# Patient Record
Sex: Female | Born: 1950
Health system: Southern US, Community
[De-identification: ages and names within clinical notes are randomized; demographics above are authoritative.]

## PROBLEM LIST (undated history)

## (undated) DIAGNOSIS — N281 Cyst of kidney, acquired: Secondary | ICD-10-CM

## (undated) DIAGNOSIS — D259 Leiomyoma of uterus, unspecified: Secondary | ICD-10-CM

## (undated) DIAGNOSIS — Z860100 Personal history of colon polyps, unspecified: Secondary | ICD-10-CM

## (undated) DIAGNOSIS — M1990 Unspecified osteoarthritis, unspecified site: Secondary | ICD-10-CM

## (undated) DIAGNOSIS — J9600 Acute respiratory failure, unspecified whether with hypoxia or hypercapnia: Secondary | ICD-10-CM

## (undated) DIAGNOSIS — Z9289 Personal history of other medical treatment: Secondary | ICD-10-CM

## (undated) DIAGNOSIS — Z8719 Personal history of other diseases of the digestive system: Secondary | ICD-10-CM

## (undated) DIAGNOSIS — F319 Bipolar disorder, unspecified: Secondary | ICD-10-CM

## (undated) DIAGNOSIS — Z87891 Personal history of nicotine dependence: Secondary | ICD-10-CM

## (undated) DIAGNOSIS — M545 Low back pain: Secondary | ICD-10-CM

## (undated) DIAGNOSIS — E119 Type 2 diabetes mellitus without complications: Secondary | ICD-10-CM

## (undated) DIAGNOSIS — G8929 Other chronic pain: Secondary | ICD-10-CM

## (undated) DIAGNOSIS — R413 Other amnesia: Secondary | ICD-10-CM

## (undated) DIAGNOSIS — M549 Dorsalgia, unspecified: Secondary | ICD-10-CM

## (undated) DIAGNOSIS — K219 Gastro-esophageal reflux disease without esophagitis: Secondary | ICD-10-CM

## (undated) DIAGNOSIS — Z9981 Dependence on supplemental oxygen: Secondary | ICD-10-CM

## (undated) DIAGNOSIS — I10 Essential (primary) hypertension: Secondary | ICD-10-CM

## (undated) DIAGNOSIS — R350 Frequency of micturition: Secondary | ICD-10-CM

## (undated) DIAGNOSIS — E785 Hyperlipidemia, unspecified: Secondary | ICD-10-CM

## (undated) DIAGNOSIS — J40 Bronchitis, not specified as acute or chronic: Secondary | ICD-10-CM

## (undated) DIAGNOSIS — R05 Cough: Secondary | ICD-10-CM

## (undated) DIAGNOSIS — G510 Bell's palsy: Secondary | ICD-10-CM

## (undated) DIAGNOSIS — H409 Unspecified glaucoma: Secondary | ICD-10-CM

## (undated) DIAGNOSIS — R0603 Acute respiratory distress: Secondary | ICD-10-CM

## (undated) DIAGNOSIS — F259 Schizoaffective disorder, unspecified: Secondary | ICD-10-CM

## (undated) DIAGNOSIS — IMO0001 Reserved for inherently not codable concepts without codable children: Secondary | ICD-10-CM

## (undated) HISTORY — PX: COLONOSCOPY W/ POLYPECTOMY: SHX1380

## (undated) HISTORY — DX: Hypercalcemia: E83.52

## (undated) HISTORY — DX: Dorsalgia, unspecified: M54.9

## (undated) HISTORY — DX: Personal history of colonic polyps: Z86.010

## (undated) HISTORY — PX: TONSILLECTOMY AND ADENOIDECTOMY: SUR1326

## (undated) HISTORY — DX: Personal history of colon polyps, unspecified: Z86.0100

## (undated) HISTORY — DX: Other amnesia: R41.3

## (undated) HISTORY — DX: Other chronic pain: G89.29

## (undated) HISTORY — DX: Hyperlipidemia, unspecified: E78.5

## (undated) HISTORY — DX: Bipolar disorder, unspecified: F31.9

## (undated) HISTORY — DX: Low back pain: M54.5

## (undated) HISTORY — PX: ABDOMINAL HYSTERECTOMY: SHX81

## (undated) HISTORY — PX: APPENDECTOMY: SHX54

## (undated) HISTORY — DX: Leiomyoma of uterus, unspecified: D25.9

---

## 1998-06-18 ENCOUNTER — Ambulatory Visit (HOSPITAL_COMMUNITY): Admission: RE | Admit: 1998-06-18 | Discharge: 1998-06-18 | Payer: Self-pay | Admitting: Family Medicine

## 1998-07-02 ENCOUNTER — Other Ambulatory Visit: Admission: RE | Admit: 1998-07-02 | Discharge: 1998-07-02 | Payer: Self-pay | Admitting: *Deleted

## 1998-10-13 ENCOUNTER — Encounter: Payer: Self-pay | Admitting: *Deleted

## 1998-10-13 ENCOUNTER — Emergency Department (HOSPITAL_COMMUNITY): Admission: EM | Admit: 1998-10-13 | Discharge: 1998-10-13 | Payer: Self-pay | Admitting: Emergency Medicine

## 1999-05-28 ENCOUNTER — Inpatient Hospital Stay (HOSPITAL_COMMUNITY): Admission: AD | Admit: 1999-05-28 | Discharge: 1999-05-30 | Payer: Self-pay | Admitting: Family Medicine

## 1999-06-06 ENCOUNTER — Encounter: Admission: RE | Admit: 1999-06-06 | Discharge: 1999-09-04 | Payer: Self-pay | Admitting: Family Medicine

## 1999-10-22 ENCOUNTER — Encounter: Admission: RE | Admit: 1999-10-22 | Discharge: 2000-01-20 | Payer: Self-pay | Admitting: Family Medicine

## 2000-05-07 ENCOUNTER — Encounter: Payer: Self-pay | Admitting: Family Medicine

## 2000-05-07 ENCOUNTER — Ambulatory Visit (HOSPITAL_COMMUNITY): Admission: RE | Admit: 2000-05-07 | Discharge: 2000-05-07 | Payer: Self-pay | Admitting: Family Medicine

## 2000-09-02 ENCOUNTER — Other Ambulatory Visit: Admission: RE | Admit: 2000-09-02 | Discharge: 2000-09-02 | Payer: Self-pay | Admitting: Family Medicine

## 2001-06-03 ENCOUNTER — Ambulatory Visit (HOSPITAL_COMMUNITY): Admission: RE | Admit: 2001-06-03 | Discharge: 2001-06-03 | Payer: Self-pay | Admitting: Family Medicine

## 2001-06-03 ENCOUNTER — Encounter: Payer: Self-pay | Admitting: Family Medicine

## 2002-06-05 ENCOUNTER — Encounter: Payer: Self-pay | Admitting: Family Medicine

## 2002-06-05 ENCOUNTER — Ambulatory Visit (HOSPITAL_COMMUNITY): Admission: RE | Admit: 2002-06-05 | Discharge: 2002-06-05 | Payer: Self-pay | Admitting: Family Medicine

## 2004-07-30 ENCOUNTER — Encounter (INDEPENDENT_AMBULATORY_CARE_PROVIDER_SITE_OTHER): Payer: Self-pay | Admitting: *Deleted

## 2004-07-30 ENCOUNTER — Ambulatory Visit (HOSPITAL_COMMUNITY): Admission: RE | Admit: 2004-07-30 | Discharge: 2004-07-30 | Payer: Self-pay | Admitting: Gastroenterology

## 2005-08-26 ENCOUNTER — Other Ambulatory Visit: Admission: RE | Admit: 2005-08-26 | Discharge: 2005-08-26 | Payer: Self-pay | Admitting: Family Medicine

## 2007-01-27 ENCOUNTER — Encounter: Admission: RE | Admit: 2007-01-27 | Discharge: 2007-01-27 | Payer: Self-pay | Admitting: Neurosurgery

## 2007-02-11 ENCOUNTER — Encounter: Admission: RE | Admit: 2007-02-11 | Discharge: 2007-02-11 | Payer: Self-pay | Admitting: Neurosurgery

## 2007-09-15 ENCOUNTER — Encounter: Admission: RE | Admit: 2007-09-15 | Discharge: 2007-09-15 | Payer: Self-pay | Admitting: Neurosurgery

## 2007-09-29 ENCOUNTER — Encounter: Admission: RE | Admit: 2007-09-29 | Discharge: 2007-09-29 | Payer: Self-pay | Admitting: Neurosurgery

## 2009-10-17 ENCOUNTER — Encounter: Admission: RE | Admit: 2009-10-17 | Discharge: 2009-10-17 | Payer: Self-pay | Admitting: Orthopedic Surgery

## 2009-11-05 ENCOUNTER — Encounter: Admission: RE | Admit: 2009-11-05 | Discharge: 2009-11-05 | Payer: Self-pay | Admitting: Orthopedic Surgery

## 2009-11-28 ENCOUNTER — Ambulatory Visit (HOSPITAL_COMMUNITY): Admission: RE | Admit: 2009-11-28 | Discharge: 2009-11-28 | Payer: Self-pay | Admitting: Orthopedic Surgery

## 2009-12-19 ENCOUNTER — Encounter: Admission: RE | Admit: 2009-12-19 | Discharge: 2010-01-08 | Payer: Self-pay | Admitting: Orthopedic Surgery

## 2010-06-12 ENCOUNTER — Encounter: Admission: RE | Admit: 2010-06-12 | Discharge: 2010-06-12 | Payer: Self-pay | Admitting: Internal Medicine

## 2011-01-04 ENCOUNTER — Encounter: Payer: Self-pay | Admitting: Internal Medicine

## 2011-03-16 LAB — GLUCOSE, CAPILLARY: Glucose-Capillary: 150 mg/dL — ABNORMAL HIGH (ref 70–99)

## 2011-03-17 LAB — CBC
MCV: 88.6 fL (ref 78.0–100.0)
WBC: 10.6 10*3/uL — ABNORMAL HIGH (ref 4.0–10.5)

## 2011-03-17 LAB — COMPREHENSIVE METABOLIC PANEL
ALT: 25 U/L (ref 0–35)
Alkaline Phosphatase: 63 U/L (ref 39–117)
CO2: 24 mEq/L (ref 19–32)
Chloride: 105 mEq/L (ref 96–112)
GFR calc non Af Amer: >60 mL/min (ref >60)
Total Bilirubin: 0.3 mg/dL (ref 0.3–1.2)
Total Protein: 7.3 g/dL (ref 6.0–8.3)

## 2011-03-17 LAB — URINALYSIS, ROUTINE W REFLEX MICROSCOPIC
Glucose, UA: NEGATIVE mg/dL
Ketones, ur: NEGATIVE mg/dL
Protein, ur: NEGATIVE mg/dL
pH: 5.5 (ref 5.0–8.0)

## 2011-05-01 NOTE — Op Note (Signed)
NAME:  Elizabeth Thompson, FAUX                       ACCOUNT NO.:  [id number]   MEDICAL RECORD NO.:  [id number]                   PATIENT TYPE:  AMB   LOCATION:  ENDO                                 FACILITY:  [facility]   PHYSICIAN:  Nelwyn Salisbury, M.D.               DATE OF BIRTH:  18-Jan-1951   DATE OF PROCEDURE:  07/30/2004  DATE OF DISCHARGE:                                 OPERATIVE REPORT   PROCEDURE:  Colonoscopy with biopsy (cold).   INDICATIONS FOR PROCEDURE:  Iron deficiency anemia in a 60 year old African  American female, rule out colonic polyps, masses, etc.   PREPROCEDURE PREPARATION:  Informed consent was obtained from the patient.  The patient was fasted for eight hours prior to the procedure and prepped  with a bottle of magnesium citrate and a gallon of GoLYTELY the night prior  to the procedure.   PREPROCEDURE PHYSICAL:  Patient with stable vital signs.  Neck supple.  Chest clear to auscultation.  S1 and S2 regular.  Abdomen soft with normal  bowel sounds.   DESCRIPTION OF PROCEDURE:  The patient was placed in the left lateral  decubitus position, sedated with Demerol and Versed for the EGD, no  additional sedation was used for the colonoscopy.  Once the patient was  adequately sedated, maintained on low flow oxygen and continuous cardiac  monitoring, the Olympus video colonoscope was advanced from the rectum to  the cecum.  The appendiceal orifice and ileocecal valve were visualized and  photographed.  There was some residual stool in the dependent areas of the  colon, multiple washings were done.  A small sessile polyp was biopsied from  the rectosigmoid colon.  Three small sessile polyps were biopsied from the  rectum.  Retroflexion in the rectum revealed small rectal polyps as  mentioned above.  No other abnormalities were noted.  The patient tolerated  the procedure well without complications.   IMPRESSION:  1. Small sessile polyps biopsied (cold) from  rectosigmoid, colon, and     rectum.  Otherwise, unrevealing colonoscopy to the cecum.  2. Residual stool in the colon, small lesions could have been missed.   RECOMMENDATIONS:  1. Await pathology results.  2. Outpatient follow up in the next two weeks for further recommendations.                                               Nelwyn Salisbury, M.D.    JNM/MEDQ  D:  07/30/2004  T:  07/30/2004  Job:  [id number]   cc:   Lucianne Lei, M.D.  [phone] [address], [address]  [place]  [place] [address]  Fax: [phone]

## 2011-05-01 NOTE — Op Note (Signed)
NAME:  Elizabeth Thompson, Elizabeth Thompson                       ACCOUNT NO.:  [id number]   MEDICAL RECORD NO.:  [id number]                   PATIENT TYPE:  AMB   LOCATION:  ENDO                                 FACILITY:  [facility]   PHYSICIAN:  Nelwyn Salisbury, M.D.               DATE OF BIRTH:  [birth year]   DATE OF PROCEDURE:  07/30/2004  DATE OF DISCHARGE:                                 OPERATIVE REPORT   PROCEDURE:  Esophagogastroduodenoscopy with biopsy.   ENDOSCOPIST:  Juanita Craver, M.D.   INSTRUMENT USED:  Olympus video panendoscope.   INDICATIONS FOR PROCEDURE:  60 year old African American female with a  history of iron deficiency anemia undergoing EGD to rule out peptic ulcer  disease, esophagitis, gastritis, etc.   PREPROCEDURE PREPARATION:  Informed consent was obtained from the patient.  The patient was fasted for eight hours prior to the procedure.   PREPROCEDURE PHYSICAL:  Patient with stable vital signs.  Neck supple.  Chest clear to auscultation.  S1 and S2 regular.  Abdomen soft with normal  bowel sounds.   DESCRIPTION OF PROCEDURE:  The patient was placed in the left lateral  decubitus position, sedated with 60 mg of Demerol and 6 mg Versed in slow  incremental doses.  Once the patient was adequately sedated, maintained on  low flow oxygen and continuous cardiac monitoring, the Olympus video  panendoscope was advanced through the mouth piece over the tongue into the  esophagus under direct vision.  The entire esophagus appeared normal with no  evidence of ring, strictures, masses, esophagitis, or Barrett's mucosa.  The  scope was then advanced into the stomach.  Two small nodular lesions were  biopsied in the proximal stomach.  The rest of the gastric mucosa appeared  healthy.  Mild antral erythema was noted and the proximal small bowel  appeared normal.  There was no outlet obstruction.  Small bowel biopsies  were done to rule out sprue.   IMPRESSION:  1. Two small nodular  lesions biopsied from the proximal stomach.  2. Small bowel biopsies done to rule out sprue.  3. Normal appearing esophagus.  4. Mild antral gastritis.   RECOMMENDATIONS:  1. Await pathology results.  2. Avoid nonsteroidals.  3. Proceed with colonoscopy at this time.  4. Further recommendations will be made in follow up.                                               Nelwyn Salisbury, M.D.    JNM/MEDQ  D:  07/30/2004  T:  07/30/2004  Job:  [id number]   cc:   Lucianne Lei, M.D.  [phone] [address], [address]  [place]  [place] [address]  Fax: [phone]

## 2014-04-05 ENCOUNTER — Ambulatory Visit: Payer: Medicaid Other | Admitting: Podiatry

## 2014-08-14 ENCOUNTER — Emergency Department (HOSPITAL_COMMUNITY)
Admission: EM | Admit: 2014-08-14 | Discharge: 2014-08-16 | Disposition: A | Discharge status: Home / Self Care | Payer: Medicaid Other | Attending: Dermatology | Admitting: Dermatology

## 2014-08-14 ENCOUNTER — Other Ambulatory Visit: Payer: Self-pay | Admitting: Physician Assistant

## 2014-08-14 DIAGNOSIS — Z8669 Personal history of other diseases of the nervous system and sense organs: Secondary | ICD-10-CM | POA: Diagnosis not present

## 2014-08-14 DIAGNOSIS — H409 Unspecified glaucoma: Secondary | ICD-10-CM | POA: Diagnosis not present

## 2014-08-14 DIAGNOSIS — F329 Major depressive disorder, single episode, unspecified: Secondary | ICD-10-CM | POA: Insufficient documentation

## 2014-08-14 DIAGNOSIS — Z1231 Encounter for screening mammogram for malignant neoplasm of breast: Secondary | ICD-10-CM

## 2014-08-14 DIAGNOSIS — I10 Essential (primary) hypertension: Secondary | ICD-10-CM | POA: Insufficient documentation

## 2014-08-14 DIAGNOSIS — E119 Type 2 diabetes mellitus without complications: Secondary | ICD-10-CM | POA: Diagnosis not present

## 2014-08-14 DIAGNOSIS — R45851 Suicidal ideations: Secondary | ICD-10-CM | POA: Insufficient documentation

## 2014-08-14 DIAGNOSIS — R110 Nausea: Secondary | ICD-10-CM | POA: Diagnosis not present

## 2014-08-14 DIAGNOSIS — Z79899 Other long term (current) drug therapy: Secondary | ICD-10-CM | POA: Diagnosis not present

## 2014-08-14 DIAGNOSIS — F251 Schizoaffective disorder, depressive type: Secondary | ICD-10-CM

## 2014-08-14 DIAGNOSIS — F17200 Nicotine dependence, unspecified, uncomplicated: Secondary | ICD-10-CM | POA: Diagnosis not present

## 2014-08-14 DIAGNOSIS — F259 Schizoaffective disorder, unspecified: Secondary | ICD-10-CM | POA: Diagnosis not present

## 2014-08-14 DIAGNOSIS — F3289 Other specified depressive episodes: Secondary | ICD-10-CM | POA: Insufficient documentation

## 2014-08-14 DIAGNOSIS — R197 Diarrhea, unspecified: Secondary | ICD-10-CM | POA: Insufficient documentation

## 2014-08-14 HISTORY — DX: Essential (primary) hypertension: I10

## 2014-08-14 HISTORY — DX: Type 2 diabetes mellitus without complications: E11.9

## 2014-08-14 HISTORY — DX: Bell's palsy: G51.0

## 2014-08-14 HISTORY — DX: Unspecified glaucoma: H40.9

## 2014-08-14 HISTORY — DX: Schizoaffective disorder, unspecified: F25.9

## 2014-08-14 NOTE — ED Notes (Signed)
Bed: [facility] Expected date:  Expected time:  Means of arrival:  Comments:
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
## 2014-08-14 NOTE — ED Notes (Signed)
Bed: WTR7 Expected date:  Expected time:  Means of arrival:  Comments: 

## 2014-08-15 ENCOUNTER — Encounter (HOSPITAL_COMMUNITY): Payer: Self-pay | Admitting: Emergency Medicine

## 2014-08-15 DIAGNOSIS — R45851 Suicidal ideations: Secondary | ICD-10-CM

## 2014-08-15 DIAGNOSIS — R45850 Homicidal ideations: Secondary | ICD-10-CM

## 2014-08-15 DIAGNOSIS — F259 Schizoaffective disorder, unspecified: Secondary | ICD-10-CM

## 2014-08-15 LAB — COMPREHENSIVE METABOLIC PANEL
ALBUMIN: 3.8 g/dL (ref 3.5–5.2)
ALK PHOS: 72 U/L (ref 39–117)
ALT: 13 U/L (ref 0–35)
ANION GAP: 16 — AB (ref 5–15)
AST: 26 U/L (ref 0–37)
BILIRUBIN TOTAL: 0.2 mg/dL — AB (ref 0.3–1.2)
BUN: 12 mg/dL (ref 6–23)
CALCIUM: 10.7 mg/dL — AB (ref 8.4–10.5)
CO2: 22 mEq/L (ref 19–32)
CREATININE: 0.76 mg/dL (ref 0.50–1.10)
Chloride: 92 mEq/L — ABNORMAL LOW (ref 96–112)
GFR, EST NON AFRICAN AMERICAN: 88 mL/min — AB (ref >90)
Glucose, Bld: 333 mg/dL — ABNORMAL HIGH (ref 70–99)
POTASSIUM: 4.7 meq/L (ref 3.7–5.3)
SODIUM: 130 meq/L — AB (ref 137–147)
Total Protein: 8.4 g/dL — ABNORMAL HIGH (ref 6.0–8.3)

## 2014-08-15 LAB — CBG MONITORING, ED
Glucose-Capillary: 82 mg/dL (ref 70–99)
Glucose-Capillary: 97 mg/dL (ref 70–99)

## 2014-08-15 LAB — CBC
HCT: 38.7 % (ref 36.0–46.0)
Hemoglobin: 13 g/dL (ref 12.0–15.0)
MCH: 26.6 pg (ref 26.0–34.0)
MCHC: 33.6 g/dL (ref 30.0–36.0)
MCV: 79.1 fL (ref 78.0–100.0)
PLATELETS: 386 10*3/uL (ref 150–400)
RBC: 4.89 MIL/uL (ref 3.87–5.11)
RDW: 16.3 % — ABNORMAL HIGH (ref 11.5–15.5)
WBC: 11.6 10*3/uL — AB (ref 4.0–10.5)

## 2014-08-15 LAB — ETHANOL

## 2014-08-15 LAB — RAPID URINE DRUG SCREEN, HOSP PERFORMED
Amphetamines: NOT DETECTED
BENZODIAZEPINES: NOT DETECTED
Barbiturates: NOT DETECTED
Cocaine: NOT DETECTED
Opiates: NOT DETECTED
Tetrahydrocannabinol: NOT DETECTED

## 2014-08-15 LAB — ACETAMINOPHEN LEVEL: Acetaminophen (Tylenol), Serum: <15 ug/mL (ref 10–30)

## 2014-08-15 LAB — SALICYLATE LEVEL

## 2014-08-15 MED ORDER — RISPERIDONE 2 MG PO TABS
4.0000 mg | ORAL_TABLET | Freq: Every day | ORAL | Status: DC
  Administered 2014-08-15: 4 mg via ORAL
  Filled 2014-08-15: qty 2

## 2014-08-15 MED ORDER — BENZTROPINE MESYLATE 1 MG PO TABS
0.5000 mg | ORAL_TABLET | Freq: Every day | ORAL | Status: DC
  Administered 2014-08-15: 1 mg via ORAL
  Filled 2014-08-15: qty 1

## 2014-08-15 MED ORDER — CITALOPRAM HYDROBROMIDE 20 MG PO TABS
20.0000 mg | ORAL_TABLET | Freq: Every day | ORAL | Status: DC
  Administered 2014-08-15 – 2014-08-16 (×2): 20 mg via ORAL
  Filled 2014-08-15 (×2): qty 1

## 2014-08-15 MED ORDER — LATANOPROST 0.005 % OP SOLN
1.0000 [drp] | Freq: Every day | OPHTHALMIC | Status: DC
  Administered 2014-08-15: 1 [drp] via OPHTHALMIC
  Filled 2014-08-15: qty 2.5

## 2014-08-15 MED ORDER — RISPERIDONE 0.5 MG PO TABS
0.5000 mg | ORAL_TABLET | Freq: Every morning | ORAL | Status: DC
  Administered 2014-08-15 – 2014-08-16 (×2): 0.5 mg via ORAL
  Filled 2014-08-15 (×2): qty 1

## 2014-08-15 MED ORDER — SIMVASTATIN 40 MG PO TABS
40.0000 mg | ORAL_TABLET | Freq: Every day | ORAL | Status: DC
Start: 2014-08-15 — End: 2014-08-16
  Administered 2014-08-15 – 2014-08-16 (×2): 40 mg via ORAL
  Filled 2014-08-15 (×2): qty 1

## 2014-08-15 MED ORDER — TRAZODONE HCL 100 MG PO TABS
200.0000 mg | ORAL_TABLET | Freq: Every day | ORAL | Status: DC
  Administered 2014-08-15: 200 mg via ORAL
  Filled 2014-08-15: qty 2

## 2014-08-15 MED ORDER — ADULT MULTIVITAMIN W/MINERALS CH
1.0000 | ORAL_TABLET | Freq: Every day | ORAL | Status: DC
  Administered 2014-08-15 – 2014-08-16 (×2): 1 via ORAL
  Filled 2014-08-15 (×2): qty 1

## 2014-08-15 MED ORDER — DIPHENHYDRAMINE HCL 25 MG PO CAPS
50.0000 mg | ORAL_CAPSULE | Freq: Every day | ORAL | Status: DC
  Administered 2014-08-15: 50 mg via ORAL
  Filled 2014-08-15: qty 2

## 2014-08-15 MED ORDER — GLIMEPIRIDE 4 MG PO TABS
4.0000 mg | ORAL_TABLET | Freq: Every day | ORAL | Status: DC
  Administered 2014-08-15 – 2014-08-16 (×2): 4 mg via ORAL
  Filled 2014-08-15 (×3): qty 1

## 2014-08-15 MED ORDER — LISINOPRIL 40 MG PO TABS
40.0000 mg | ORAL_TABLET | Freq: Every day | ORAL | Status: DC
  Administered 2014-08-15: 40 mg via ORAL
  Filled 2014-08-15 (×2): qty 1

## 2014-08-15 MED ORDER — SODIUM CHLORIDE 0.9 % IV BOLUS (SEPSIS)
500.0000 mL | Freq: Once | INTRAVENOUS | Status: AC
  Administered 2014-08-15: 500 mL via INTRAVENOUS

## 2014-08-15 MED ORDER — METFORMIN HCL 500 MG PO TABS
1000.0000 mg | ORAL_TABLET | Freq: Two times a day (BID) | ORAL | Status: DC
  Filled 2014-08-15 (×2): qty 2

## 2014-08-15 MED ORDER — ALBUTEROL SULFATE HFA 108 (90 BASE) MCG/ACT IN AERS: 2.0000 | INHALATION_SPRAY | Freq: Four times a day (QID) | RESPIRATORY_TRACT | Status: DC | PRN

## 2014-08-15 MED ORDER — METFORMIN HCL 500 MG PO TABS
1000.0000 mg | ORAL_TABLET | Freq: Two times a day (BID) | ORAL | Status: DC
  Administered 2014-08-15 – 2014-08-16 (×3): 1000 mg via ORAL
  Filled 2014-08-15 (×5): qty 2

## 2014-08-15 NOTE — ED Notes (Signed)
Per IVC paperwork, the pt has a hx of schizoaffective disorder. Per pt she is hearing voices that command her to hurt herself, her niece, and the cats where she lives. Pt is calm and cooperative.
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
## 2014-08-15 NOTE — ED Notes (Signed)
Patient has been resting. Very pleasant and cooperative when awake. Denies SI, HI, and AV hallucinations. Says she feels like she is back to her old self after eating something and getting a bag of iv fluids. Bright affect. Sitting in bed and reading her Bible. Will continue to monitor. Libby Maw, RN

## 2014-08-16 ENCOUNTER — Inpatient Hospital Stay (HOSPITAL_COMMUNITY)
Admission: EM | Admit: 2014-08-16 | Discharge: 2014-08-27 | DRG: 885 | Disposition: A | Discharge status: Home / Self Care | Payer: Medicaid Other | Source: Intra-hospital | Attending: Psychiatry | Admitting: Psychiatry

## 2014-08-16 ENCOUNTER — Encounter (HOSPITAL_COMMUNITY): Payer: Self-pay

## 2014-08-16 DIAGNOSIS — J45909 Unspecified asthma, uncomplicated: Secondary | ICD-10-CM | POA: Diagnosis present

## 2014-08-16 DIAGNOSIS — F17200 Nicotine dependence, unspecified, uncomplicated: Secondary | ICD-10-CM | POA: Diagnosis present

## 2014-08-16 DIAGNOSIS — F259 Schizoaffective disorder, unspecified: Principal | ICD-10-CM | POA: Diagnosis present

## 2014-08-16 DIAGNOSIS — F329 Major depressive disorder, single episode, unspecified: Secondary | ICD-10-CM | POA: Diagnosis present

## 2014-08-16 DIAGNOSIS — F39 Unspecified mood [affective] disorder: Secondary | ICD-10-CM | POA: Diagnosis present

## 2014-08-16 DIAGNOSIS — R45850 Homicidal ideations: Secondary | ICD-10-CM

## 2014-08-16 DIAGNOSIS — E785 Hyperlipidemia, unspecified: Secondary | ICD-10-CM | POA: Diagnosis present

## 2014-08-16 DIAGNOSIS — I10 Essential (primary) hypertension: Secondary | ICD-10-CM | POA: Diagnosis present

## 2014-08-16 DIAGNOSIS — G3184 Mild cognitive impairment, so stated: Secondary | ICD-10-CM

## 2014-08-16 DIAGNOSIS — F251 Schizoaffective disorder, depressive type: Secondary | ICD-10-CM

## 2014-08-16 DIAGNOSIS — E7800 Pure hypercholesterolemia, unspecified: Secondary | ICD-10-CM | POA: Diagnosis present

## 2014-08-16 DIAGNOSIS — F411 Generalized anxiety disorder: Secondary | ICD-10-CM | POA: Diagnosis present

## 2014-08-16 DIAGNOSIS — F29 Unspecified psychosis not due to a substance or known physiological condition: Secondary | ICD-10-CM

## 2014-08-16 DIAGNOSIS — H409 Unspecified glaucoma: Secondary | ICD-10-CM | POA: Diagnosis present

## 2014-08-16 DIAGNOSIS — R45851 Suicidal ideations: Secondary | ICD-10-CM | POA: Diagnosis not present

## 2014-08-16 DIAGNOSIS — Z609 Problem related to social environment, unspecified: Secondary | ICD-10-CM | POA: Diagnosis not present

## 2014-08-16 DIAGNOSIS — E119 Type 2 diabetes mellitus without complications: Secondary | ICD-10-CM | POA: Diagnosis present

## 2014-08-16 DIAGNOSIS — F3289 Other specified depressive episodes: Secondary | ICD-10-CM

## 2014-08-16 DIAGNOSIS — G4700 Insomnia, unspecified: Secondary | ICD-10-CM | POA: Diagnosis present

## 2014-08-16 DIAGNOSIS — R419 Unspecified symptoms and signs involving cognitive functions and awareness: Secondary | ICD-10-CM

## 2014-08-16 LAB — COMPREHENSIVE METABOLIC PANEL
ALT: 13 U/L (ref 0–35)
ANION GAP: 15 (ref 5–15)
AST: 20 U/L (ref 0–37)
Albumin: 3.6 g/dL (ref 3.5–5.2)
Alkaline Phosphatase: 63 U/L (ref 39–117)
BUN: 13 mg/dL (ref 6–23)
CALCIUM: 10.7 mg/dL — AB (ref 8.4–10.5)
CO2: 25 mEq/L (ref 19–32)
CREATININE: 0.74 mg/dL (ref 0.50–1.10)
Chloride: 93 mEq/L — ABNORMAL LOW (ref 96–112)
GFR calc Af Amer: >90 mL/min (ref >90)
GFR calc non Af Amer: 89 mL/min — ABNORMAL LOW (ref >90)
Glucose, Bld: 119 mg/dL — ABNORMAL HIGH (ref 70–99)
Potassium: 4.3 mEq/L (ref 3.7–5.3)
Sodium: 133 mEq/L — ABNORMAL LOW (ref 137–147)
Total Bilirubin: <0.2 mg/dL — ABNORMAL LOW (ref 0.3–1.2)
Total Protein: 8.2 g/dL (ref 6.0–8.3)

## 2014-08-16 LAB — CBG MONITORING, ED
GLUCOSE-CAPILLARY: 135 mg/dL — AB (ref 70–99)
GLUCOSE-CAPILLARY: 79 mg/dL (ref 70–99)
Glucose-Capillary: 97 mg/dL (ref 70–99)

## 2014-08-16 MED ORDER — LORAZEPAM 1 MG PO TABS
2.0000 mg | ORAL_TABLET | Freq: Four times a day (QID) | ORAL | Status: DC | PRN
  Administered 2014-08-16: 2 mg via ORAL
  Filled 2014-08-16: qty 2

## 2014-08-16 MED ORDER — CITALOPRAM HYDROBROMIDE 20 MG PO TABS
20.0000 mg | ORAL_TABLET | Freq: Every day | ORAL | Status: DC
  Administered 2014-08-17: 20 mg via ORAL
  Filled 2014-08-16 (×3): qty 1

## 2014-08-16 MED ORDER — LISINOPRIL 20 MG PO TABS
40.0000 mg | ORAL_TABLET | Freq: Every day | ORAL | Status: DC
  Administered 2014-08-17 – 2014-08-21 (×5): 40 mg via ORAL
  Filled 2014-08-16 (×3): qty 2
  Filled 2014-08-16: qty 1
  Filled 2014-08-16: qty 2
  Filled 2014-08-16: qty 1
  Filled 2014-08-16 (×3): qty 2

## 2014-08-16 MED ORDER — METFORMIN HCL 500 MG PO TABS: 1000.0000 mg | ORAL_TABLET | Freq: Two times a day (BID) | ORAL | Status: DC

## 2014-08-16 MED ORDER — HALOPERIDOL LACTATE 5 MG/ML IJ SOLN: 5.0000 mg | Freq: Four times a day (QID) | INTRAMUSCULAR | Status: DC | PRN

## 2014-08-16 MED ORDER — LORAZEPAM 2 MG/ML IJ SOLN: 2.0000 mg | Freq: Four times a day (QID) | INTRAMUSCULAR | Status: DC | PRN

## 2014-08-16 MED ORDER — BENZTROPINE MESYLATE 0.5 MG PO TABS
0.5000 mg | ORAL_TABLET | Freq: Every day | ORAL | Status: DC
  Administered 2014-08-16: 0.5 mg via ORAL
  Filled 2014-08-16 (×3): qty 1

## 2014-08-16 MED ORDER — TRAZODONE HCL 100 MG PO TABS
200.0000 mg | ORAL_TABLET | Freq: Every day | ORAL | Status: DC
  Administered 2014-08-16 – 2014-08-19 (×4): 200 mg via ORAL
  Filled 2014-08-16 (×6): qty 2

## 2014-08-16 MED ORDER — RISPERIDONE 2 MG PO TABS
4.0000 mg | ORAL_TABLET | Freq: Every day | ORAL | Status: DC
  Administered 2014-08-16: 4 mg via ORAL
  Filled 2014-08-16 (×3): qty 2

## 2014-08-16 MED ORDER — ALUM & MAG HYDROXIDE-SIMETH 200-200-20 MG/5ML PO SUSP: 30.0000 mL | ORAL | Status: DC | PRN

## 2014-08-16 MED ORDER — MAGNESIUM HYDROXIDE 400 MG/5ML PO SUSP: 30.0000 mL | Freq: Every day | ORAL | Status: DC | PRN

## 2014-08-16 MED ORDER — HALOPERIDOL 5 MG PO TABS
5.0000 mg | ORAL_TABLET | Freq: Four times a day (QID) | ORAL | Status: DC | PRN
  Administered 2014-08-16: 5 mg via ORAL
  Filled 2014-08-16: qty 1

## 2014-08-16 MED ORDER — ADULT MULTIVITAMIN W/MINERALS CH
1.0000 | ORAL_TABLET | Freq: Every day | ORAL | Status: DC
  Administered 2014-08-17 – 2014-08-27 (×11): 1 via ORAL
  Filled 2014-08-16 (×14): qty 1

## 2014-08-16 MED ORDER — GLIMEPIRIDE 4 MG PO TABS
4.0000 mg | ORAL_TABLET | Freq: Every day | ORAL | Status: DC
  Administered 2014-08-17 – 2014-08-27 (×11): 4 mg via ORAL
  Filled 2014-08-16 (×11): qty 1
  Filled 2014-08-16: qty 2
  Filled 2014-08-16 (×2): qty 1
  Filled 2014-08-16: qty 2

## 2014-08-16 MED ORDER — LATANOPROST 0.005 % OP SOLN
1.0000 [drp] | Freq: Every day | OPHTHALMIC | Status: DC
  Administered 2014-08-17 – 2014-08-26 (×10): 1 [drp] via OPHTHALMIC
  Filled 2014-08-16: qty 2.5

## 2014-08-16 MED ORDER — DIPHENHYDRAMINE HCL 50 MG PO CAPS
50.0000 mg | ORAL_CAPSULE | Freq: Every day | ORAL | Status: DC
  Administered 2014-08-16: 50 mg via ORAL
  Filled 2014-08-16: qty 2
  Filled 2014-08-16 (×2): qty 1

## 2014-08-16 MED ORDER — METFORMIN HCL 500 MG PO TABS
1000.0000 mg | ORAL_TABLET | Freq: Two times a day (BID) | ORAL | Status: DC
  Administered 2014-08-17 – 2014-08-27 (×21): 1000 mg via ORAL
  Filled 2014-08-16 (×26): qty 2

## 2014-08-16 MED ORDER — RISPERIDONE 0.5 MG PO TABS
0.5000 mg | ORAL_TABLET | Freq: Every morning | ORAL | Status: DC
  Administered 2014-08-17: 0.5 mg via ORAL
  Filled 2014-08-16 (×3): qty 1

## 2014-08-16 MED ORDER — RISPERIDONE 2 MG PO TABS: 4.0000 mg | ORAL_TABLET | Freq: Every day | ORAL | Status: DC

## 2014-08-16 MED ORDER — ALBUTEROL SULFATE HFA 108 (90 BASE) MCG/ACT IN AERS: 2.0000 | INHALATION_SPRAY | Freq: Four times a day (QID) | RESPIRATORY_TRACT | Status: DC | PRN

## 2014-08-16 MED ORDER — SIMVASTATIN 40 MG PO TABS
40.0000 mg | ORAL_TABLET | Freq: Every day | ORAL | Status: DC
  Administered 2014-08-17 – 2014-08-27 (×11): 40 mg via ORAL
  Filled 2014-08-16 (×12): qty 1
  Filled 2014-08-16: qty 2
  Filled 2014-08-16: qty 1

## 2014-08-16 MED ORDER — ACETAMINOPHEN 325 MG PO TABS: 650.0000 mg | ORAL_TABLET | Freq: Four times a day (QID) | ORAL | Status: DC | PRN

## 2014-08-16 NOTE — Progress Notes (Signed)
Pt accepted to [facility] per [facility] and nursing staff are informed.    Chesley Noon, MSW, [place] Clinical Social Worker [phone]

## 2014-08-16 NOTE — Progress Notes (Addendum)
Patient ID: Elizabeth Thompson, female   DOB: 08-10-1951, 63 y.o.   MRN: [id number]  Admission Note:  D:62 yr female who presents VC in no acute distress for the treatment of SI and Depression. Pt appears flat and depressed. Pt was calm and cooperative with admission process. Pt denies SI/HI/AVH/Pain at this time. Pt stated she did have thoughts to hurt herself but they are no longer there. Pt stated she did hear voices telling herself to harm herself, but denies at this time. Pt was hyper-religious and displaying bizarre behaviors prior to coming to ER.   A: Skin was assessed and found to be clear of any abnormal marks. POC and unit policies explained and understanding verbalized. Consents obtained. Food and fluids offered,denied.  R: Pt had no additional questions or concerns.

## 2014-08-16 NOTE — Consult Note (Signed)
  Psychiatric Specialty Exam: Physical Exam  ROS  Blood pressure 106/86, pulse 103, temperature 97.6 F (36.4 C), temperature source Oral, resp. rate 16, SpO2 99.00%.There is no height or weight on file to calculate BMI.  General Appearance: Casual  Eye Contact::  Good  Speech:  Clear and Coherent  Volume:  Normal  Mood:  Depressed  Affect:  Congruent  Thought Process:  Coherent  Orientation:  Full (Time, Place, and Person)  Thought Content:  Negative  Suicidal Thoughts:  No  Homicidal Thoughts:  No  Memory:  Immediate;   Good Recent;   Good Remote;   Good  Judgement:  Fair  Insight:  Fair  Psychomotor Activity:  Normal  Concentration:  Good  Recall:  Good  Akathisia:  Negative  Handed:  Right  AIMS (if indicated):     Assets:  Communication Skills Desire for Improvement Housing Social Support  Sleep:   okay last night   Elizabeth Thompson says she is not hearing any voices this morning but is afraid it will all come back if she is discharged.  The plan is to continue looking for inpatient bed for treatment of psychosis.

## 2014-08-16 NOTE — Tx Team (Signed)
Initial Interdisciplinary Treatment Plan   PATIENT STRESSORS: Financial difficulties Marital or family conflict   PROBLEM LIST: Problem List/Patient Goals Date to be addressed Date deferred Reason deferred Estimated date of resolution  Risk for Suicide 08/16/14     AH 08/16/14     Depression 08/16/14                                          DISCHARGE CRITERIA:  Improved stabilization in mood, thinking, and/or behavior  PRELIMINARY DISCHARGE PLAN: Attend PHP/IOP Outpatient therapy  PATIENT/FAMIILY INVOLVEMENT: This treatment plan has been presented to and reviewed with the patient, Elizabeth Thompson.  The patient and family have been given the opportunity to ask questions and make suggestions.  Providence Crosby 08/16/2014, 9:23 PM

## 2014-08-17 ENCOUNTER — Encounter (HOSPITAL_COMMUNITY): Payer: Self-pay | Admitting: Psychiatry

## 2014-08-17 DIAGNOSIS — F3130 Bipolar disorder, current episode depressed, mild or moderate severity, unspecified: Secondary | ICD-10-CM

## 2014-08-17 DIAGNOSIS — R45850 Homicidal ideations: Secondary | ICD-10-CM

## 2014-08-17 LAB — GLUCOSE, CAPILLARY
GLUCOSE-CAPILLARY: 134 mg/dL — AB (ref 70–99)
Glucose-Capillary: 171 mg/dL — ABNORMAL HIGH (ref 70–99)
Glucose-Capillary: 206 mg/dL — ABNORMAL HIGH (ref 70–99)

## 2014-08-17 LAB — TSH: TSH: 1.99 u[IU]/mL (ref 0.350–4.500)

## 2014-08-17 MED ORDER — LAMOTRIGINE 25 MG PO TABS
25.0000 mg | ORAL_TABLET | Freq: Every day | ORAL | Status: DC
Start: 2014-08-17 — End: 2014-08-18
  Administered 2014-08-17: 25 mg via ORAL
  Filled 2014-08-17 (×4): qty 1

## 2014-08-17 MED ORDER — BENZTROPINE MESYLATE 0.5 MG PO TABS
0.5000 mg | ORAL_TABLET | Freq: Every day | ORAL | Status: DC
  Administered 2014-08-18: 0.5 mg via ORAL
  Filled 2014-08-17 (×3): qty 1

## 2014-08-17 MED ORDER — ARIPIPRAZOLE 5 MG PO TABS
5.0000 mg | ORAL_TABLET | Freq: Two times a day (BID) | ORAL | Status: DC
  Administered 2014-08-17 – 2014-08-18 (×2): 5 mg via ORAL
  Filled 2014-08-17 (×6): qty 1

## 2014-08-17 NOTE — [facility] Group Notes (Signed)
[facility] LCSW Group Therapy  08/17/2014  1:05 PM  Type of Therapy:  Group therapy  Participation Level:  Active  Participation Quality:  Attentive  Affect:  Flat  Cognitive:  Oriented  Insight:  Limited  Engagement in Therapy:  Limited  Modes of Intervention:  Discussion, Socialization  Summary of Progress/Problems:  Chaplain was here to lead a group on themes of hope and courage."All the hope I have now is that I can make it through this day.  Hearing what other people have to say and how they have found hope is helpful"  Tearful as she expresses it. "Trust and constant practice are two of the words that have been used today that feels right."  Went on to briefly say that lack of trust is one of the things that has brought her in to the hospital.  Talked vaguely about having the strength to walk away from things.  When asked to clarify,  She said "It's kind of foggy like its got holes in it."  Elizabeth Thompson B 08/17/2014 1:46 PM
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
## 2014-08-17 NOTE — [facility] Suicide Risk Assessment (Signed)
   Nursing information obtained from:    Demographic factors:    Current Mental Status:    Loss Factors:    Historical Factors:    Risk Reduction Factors:    Total Time spent with patient: 20 minutes  CLINICAL FACTORS:   Severe Anxiety and/or Agitation Currently Psychotic  Psychiatric Specialty Exam: Physical Exam  Constitutional: She is oriented to person, place, and time. She appears well-developed and well-nourished.  Eyes: Conjunctivae are normal. Pupils are equal, round, and reactive to light.  Neck: Normal range of motion. Neck supple.  Cardiovascular: Normal rate and regular rhythm.   GI: Soft.  Musculoskeletal: Normal range of motion.  Neurological: She is alert and oriented to person, place, and time.  Skin: Skin is warm.  Psychiatric: Her speech is normal. Her mood appears anxious. Her affect is labile and inappropriate. She is actively hallucinating. Thought content is paranoid. Cognition and memory are normal. She expresses impulsivity. She expresses homicidal and suicidal ideation.    Review of Systems  Constitutional: Negative.   HENT: Negative.   Eyes: Negative.   Respiratory: Negative.   Cardiovascular: Negative.   Gastrointestinal: Negative.   Genitourinary: Negative.   Musculoskeletal: Negative.   Skin: Negative.   Psychiatric/Behavioral: Positive for depression, suicidal ideas and hallucinations. The patient is nervous/anxious and has insomnia.     Blood pressure 112/76, pulse 106, temperature 98.9 F (37.2 C), temperature source Oral, resp. rate 20, height _0  (1.626 m), weight 86.637 kg (191 lb).Body mass index is 32.77 kg/(m^2).  General Appearance: Disheveled  Eye Sport and exercise psychologist::  Fair  Speech:  Normal Rate  Volume:  Normal  Mood:  Anxious and Depressed  Affect:  Labile and Tearful  Thought Process:  Disorganized  Orientation:  Full (Time, Place, and Person)  Thought Content:  Delusions, Hallucinations: Auditory and Paranoid Ideation  Suicidal  Thoughts:  Yes.  without intent/plan  Homicidal Thoughts:  Yes.  without intent/plan  Memory:  Immediate;   Poor Recent;   Poor Remote;   Poor  Judgement:  Impaired  Insight:  Lacking  Psychomotor Activity:  Normal  Concentration:  Poor  Recall:  Poor  Fund of Knowledge:Fair  Language: Fair  Akathisia:  No    AIMS (if indicated):   0  Assets:  Communication Skills  Sleep:  Number of Hours: 4.75   Musculoskeletal: Strength & Muscle Tone: within normal limits Gait & Station: normal Patient leans: N/A  COGNITIVE FEATURES THAT CONTRIBUTE TO RISK:  Closed-mindedness Polarized thinking    SUICIDE RISK:   Moderate:  Frequent suicidal ideation with limited intensity, and duration, some specificity in terms of plans, no associated intent, good self-control, limited dysphoria/symptomatology, some risk factors present, and identifiable protective factors, including available and accessible social support.  PLAN OF CARE:  I certify that inpatient services furnished can reasonably be expected to improve the patient's condition.  Cecille Mcclusky 08/17/2014, 12:41 PM

## 2014-08-17 NOTE — Progress Notes (Signed)
Patient ID: Elizabeth Thompson, female   DOB: [birth year], 63 y.o.   MRN: [id number] D. Pt presents with depressed mood, affect congruent. Elizabeth Thompson reports '' I'm feeling depressed, and I wanted to hurt and kill my niece, that's why I'm here. '' She was vague and could not elaborate reasons for HI towards niece. Pt thoughts disorganized. She did complete self inventory and rates her depression at 8/10 on depression scale, 10 being worst. She denies any SI. A. Medications given as ordered. Support and encouragement provided. Discussed above information with Dr. Shea Evans. R. Patient is safe. Will continue to monitor q 15 minutes for safety.

## 2014-08-17 NOTE — Progress Notes (Signed)
Inpatient Diabetes Program Recommendations  AACE/ADA: New Consensus Statement on Inpatient Glycemic Control (2013)  Target Ranges:  Prepandial:   less than 140 mg/dL      Peak postprandial:   less than 180 mg/dL (1-2 hours)      Critically ill patients:  140 - 180 mg/dL   Patient currently on home dose of Amaryl and Metformin.  CBGs have been within normal range.  Please order CBGs TID+HS for monitoring.   Thank you  Raoul Pitch BSN, RN,CDE Inpatient Diabetes Coordinator [phone] (team pager)
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
## 2014-08-18 DIAGNOSIS — F259 Schizoaffective disorder, unspecified: Principal | ICD-10-CM

## 2014-08-18 DIAGNOSIS — R45851 Suicidal ideations: Secondary | ICD-10-CM

## 2014-08-18 LAB — GLUCOSE, CAPILLARY
GLUCOSE-CAPILLARY: 146 mg/dL — AB (ref 70–99)
GLUCOSE-CAPILLARY: 185 mg/dL — AB (ref 70–99)
GLUCOSE-CAPILLARY: 174 mg/dL — AB (ref 70–99)
Glucose-Capillary: 165 mg/dL — ABNORMAL HIGH (ref 70–99)

## 2014-08-18 MED ORDER — BENZTROPINE MESYLATE 0.5 MG PO TABS
0.5000 mg | ORAL_TABLET | Freq: Every day | ORAL | Status: DC
  Administered 2014-08-19 – 2014-08-26 (×8): 0.5 mg via ORAL
  Filled 2014-08-18: qty 1
  Filled 2014-08-18: qty 4
  Filled 2014-08-18 (×8): qty 1

## 2014-08-18 MED ORDER — ARIPIPRAZOLE 10 MG PO TABS
10.0000 mg | ORAL_TABLET | Freq: Every day | ORAL | Status: DC
  Administered 2014-08-19: 10 mg via ORAL
  Filled 2014-08-18 (×2): qty 1

## 2014-08-18 MED ORDER — LAMOTRIGINE 25 MG PO TABS
25.0000 mg | ORAL_TABLET | Freq: Every day | ORAL | Status: DC
  Administered 2014-08-19 – 2014-08-20 (×2): 25 mg via ORAL
  Filled 2014-08-18 (×3): qty 1

## 2014-08-18 MED ORDER — PNEUMOCOCCAL VAC POLYVALENT 25 MCG/0.5ML IJ INJ
0.5000 mL | INJECTION | INTRAMUSCULAR | Status: AC
  Administered 2014-08-19: 0.5 mL via INTRAMUSCULAR

## 2014-08-18 NOTE — Progress Notes (Signed)
Patient ID: Elizabeth Thompson, female   DOB: 1951/11/03, 63 y.o.   MRN: [id number] D)  Elizabeth Thompson been walking in the hall this evening, stated she was tired but stated was doing"OK", just prior to group.  Seemed to be internally focused, but attended group when invited.  When asked about her eye gtts, at first couldn't remember why she took them, then said she had glaucoma, and had had blurry vision.  Was able to self administer.  Has been pleasant but minimal interaction, frequently staring.  Has been compliant with meds and went to bed after hs meds. A)  Will continue to monitor for safety, try to develop therapeutic rapport, continue POC R) safety maintained.

## 2014-08-18 NOTE — Progress Notes (Signed)
Patient ID: Elizabeth Thompson, female   DOB: 08-02-1951, 63 y.o.   MRN: [id number] D. Pt presents with depressed mood, affect congruent again today. Aizza states '' I didn't sleep last night. I'm really tired. '' She appears very sleepy and is not forthcoming with thoughts. She has been compliant with medications . A. Medications given as ordered. Support and encouragement provided. Discussed above information with Dr. Darleene Cleaver. R. Patient is safe. Will continue to monitor q 15 minutes for safety.

## 2014-08-18 NOTE — BHH Group Notes (Signed)
[place] Group Notes:  (Nursing/MHT/Case Management/Adjunct)  Date:  08/18/2014  Time:  10:40 AM  Type of Therapy:  Psychoeducational Skills--self inventory review  Participation Level:  Did Not Attend  Participation Quality:  na  Affect:  na  Cognitive:  na  Insight:  None  Engagement in Group:  na  Modes of Intervention:  na  Summary of Progress/Problems:  Leonia Reader 08/18/2014, 10:40 AM

## 2014-08-18 NOTE — BHH Group Notes (Signed)
[place] Group Notes:  (Nursing/MHT/Case Management/Adjunct)  Date:  08/18/2014  Time:  10:40 AM  Type of Therapy:  Psychoeducational Skills--healthy coping skill  Participation Level:  Did Not Attend  Participation Quality:  na  Affect:  na  Cognitive:  na  Insight:  None  Engagement in Group:  na  Modes of Intervention:  na  Summary of Progress/Problems:  Leonia Reader 08/18/2014, 10:40 AM

## 2014-08-18 NOTE — Progress Notes (Signed)
D: Pt denies SI/HI/AVH. Pt is pleasant and cooperative. Pt continues to appear to be responding to internal stimuli even though she stated she was -ve. Pt appears disorganized at times, but does not talk to Probation officer much.   A: Pt was offered support and encouragement. Pt was given scheduled medications. Pt was encourage to attend groups. Q 15 minute checks were done for safety.   R:Pt attends groups and interacts well with peers . Pt is taking medication. Pt has no complaints at this time.Pt receptive to treatment and safety maintained on unit.

## 2014-08-18 NOTE — Progress Notes (Signed)
[facility] MD Progress Note  08/18/2014 1:54 PM JAMIRACLE AVANTS  MRN:  [id number] Subjective: "I am having trouble sleeping, racing thoughts and feeling depressed.'' Objective: Patient was seen and chart reviewed. She reports difficulty sleeping, excessive worries, mood swings, paranoia and depressive symptoms. Patient continues to report hearing voices telling her to hurt herself. She denies homicidal ideations and visual hallucinations. Patient has been attending the unit milieu and her medications without verbalizing any adverse reactions. Diagnosis:   DSM5: Schizophrenia Disorders:  Delusional Disorder (297.1) and Psychotic Disorder (298.8) Obsessive-Compulsive Disorders:   Trauma-Stressor Disorders:   Substance/Addictive Disorders:  denies Depressive Disorders:  Major Depressive Disorder - with Psychotic Features (296.24) Total Time spent with patient: 30 minutes  Axis I: Schizoaffective disorder  Axis II: Deferred Axis III:  Past Medical History  Diagnosis Date  . Hypertension   . Bell's palsy   . Glaucoma   . Diabetes mellitus without complication    Axis IV: other psychosocial or environmental problems and problems related to social environment  ADL's:  Intact  Sleep: Poor  Appetite:  Fair  Suicidal Ideation: passive  Plan:  denies Homicidal Ideation:  denies AEB (as evidenced by):  Psychiatric Specialty Exam: Physical Exam  Psychiatric: Judgment normal. Her mood appears anxious. Her speech is rapid and/or pressured. She is actively hallucinating. Thought content is paranoid. Cognition and memory are normal. She exhibits a depressed mood. She expresses suicidal ideation.    Review of Systems  Constitutional: Negative.   HENT: Negative.   Eyes: Negative.   Respiratory: Negative.   Cardiovascular: Negative.   Gastrointestinal: Negative.   Genitourinary: Negative.   Musculoskeletal: Negative.   Skin: Negative.   Neurological: Negative.   Endo/Heme/Allergies:  Negative.   Psychiatric/Behavioral: Positive for depression and hallucinations. The patient is nervous/anxious and has insomnia.     Blood pressure 127/68, pulse 98, temperature 98.4 F (36.9 C), temperature source Oral, resp. rate 16, height _0  (1.626 m), weight 86.637 kg (191 lb).Body mass index is 32.77 kg/(m^2).  General Appearance: Disheveled  Eye Contact::  Minimal  Speech:  Garbled and Slow  Volume:  Decreased  Mood:  Dysphoric  Affect:  Constricted  Thought Process:  Circumstantial and Disorganized  Orientation:  Full (Time, Place, and Person)  Thought Content:  Delusions and Hallucinations: Auditory  Suicidal Thoughts:  Yes.  without intent/plan  Homicidal Thoughts:  No  Memory:  Immediate;   Fair Recent;   Fair Remote;   Fair  Judgement:  Impaired  Insight:  Lacking  Psychomotor Activity:  Decreased  Concentration:  Fair  Recall:  [employer] of Knowledge:Good  Language: Good  Akathisia:  No  Handed:  Right  AIMS (if indicated):     Assets:  Communication Skills Desire for Improvement Physical Health  Sleep:  Number of Hours: 5.75   Musculoskeletal: Strength & Muscle Tone: within normal limits Gait & Station: normal Patient leans: N/A  Current Medications: Current Facility-Administered Medications  Medication Dose Route Frequency Provider Last Rate Last Dose  . acetaminophen (TYLENOL) tablet 650 mg  650 mg Oral Q6H PRN Shuvon Rankin, NP      . albuterol (PROVENTIL HFA;VENTOLIN HFA) 108 (90 BASE) MCG/ACT inhaler 2 puff  2 puff Inhalation Q6H PRN Shuvon Rankin, NP      . alum & mag hydroxide-simeth (MAALOX/MYLANTA) 200-200-20 MG/5ML suspension 30 mL  30 mL Oral Q4H PRN Shuvon Rankin, NP      . [START ON 08/19/2014] ARIPiprazole (ABILIFY) tablet 10 mg  10 mg Oral  QHS Tivon Lemoine      . [START ON 08/19/2014] benztropine (COGENTIN) tablet 0.5 mg  0.5 mg Oral QHS Bolden Hagerman      . glimepiride (AMARYL) tablet 4 mg  4 mg Oral Q breakfast Shuvon Rankin, NP   4  mg at 08/18/14 0807  . [START ON 08/19/2014] lamoTRIgine (LAMICTAL) tablet 25 mg  25 mg Oral Daily Leelynn Whetsel      . latanoprost (XALATAN) 0.005 % ophthalmic solution 1 drop  1 drop Both Eyes QHS Shuvon Rankin, NP   1 drop at 08/17/14 2123  . lisinopril (PRINIVIL,ZESTRIL) tablet 40 mg  40 mg Oral Daily Shuvon Rankin, NP   40 mg at 08/18/14 0805  . magnesium hydroxide (MILK OF MAGNESIA) suspension 30 mL  30 mL Oral Daily PRN Shuvon Rankin, NP      . metFORMIN (GLUCOPHAGE) tablet 1,000 mg  1,000 mg Oral BID WC Shuvon Rankin, NP   1,000 mg at 08/18/14 0805  . multivitamin with minerals tablet 1 tablet  1 tablet Oral Daily Shuvon Rankin, NP   1 tablet at 08/18/14 0805  . [START ON 08/19/2014] pneumococcal 23 valent vaccine (PNU-IMMUNE) injection 0.5 mL  0.5 mL Intramuscular Tomorrow-1000 Saramma Eappen, MD      . simvastatin (ZOCOR) tablet 40 mg  40 mg Oral Daily Shuvon Rankin, NP   40 mg at 08/18/14 0805  . traZODone (DESYREL) tablet 200 mg  200 mg Oral QHS Shuvon Rankin, NP   200 mg at 08/17/14 2247    Lab Results:  Results for orders placed during the hospital encounter of 08/16/14 (from the past 48 hour(s))  GLUCOSE, CAPILLARY     Status: Abnormal   Collection Time    08/17/14 11:55 AM      Result Value Ref Range   Glucose-Capillary 134 (*) 70 - 99 mg/dL  GLUCOSE, CAPILLARY     Status: Abnormal   Collection Time    08/17/14  4:58 PM      Result Value Ref Range   Glucose-Capillary 171 (*) 70 - 99 mg/dL  TSH     Status: None   Collection Time    08/17/14  7:41 PM      Result Value Ref Range   TSH 1.990  0.350 - 4.500 uIU/mL   Comment: Performed at [place], CAPILLARY     Status: Abnormal   Collection Time    08/17/14  8:41 PM      Result Value Ref Range   Glucose-Capillary 206 (*) 70 - 99 mg/dL  GLUCOSE, CAPILLARY     Status: Abnormal   Collection Time    08/18/14  6:06 AM      Result Value Ref Range   Glucose-Capillary 146 (*) 70 - 99 mg/dL  GLUCOSE,  CAPILLARY     Status: Abnormal   Collection Time    08/18/14 11:40 AM      Result Value Ref Range   Glucose-Capillary 165 (*) 70 - 99 mg/dL   Comment 1 Documented in Chart     Comment 2 Notify RN      Physical Findings: AIMS: Facial and Oral Movements Muscles of Facial Expression: None, normal Lips and Perioral Area: None, normal Jaw: None, normal Tongue: None, normal,Extremity Movements Upper (arms, wrists, hands, fingers): None, normal Lower (legs, knees, ankles, toes): None, normal, Trunk Movements Neck, shoulders, hips: None, normal, Overall Severity Severity of abnormal movements (highest score from questions above): None, normal Incapacitation due to abnormal movements: None,  normal Patient's awareness of abnormal movements (rate only patient's report): No Awareness, Dental Status Current problems with teeth and/or dentures?: Yes (no teeth) Does patient usually wear dentures?: Yes (don't have with her)  CIWA:  CIWA-Ar Total: 0 COWS:  COWS Total Score: 1  Treatment Plan Summary: Daily contact with patient to assess and evaluate symptoms and progress in treatment Medication management  Plan: 1. Admit for crisis management and stabilization. 2. Medication management to reduce current symptoms to base line and improve the patient's overall level of functioning: -Continue Abilify 65m po qhs mood/psychosis -Continue Trazodone 2092mpo qhs for insomnia -Lamictal 2549mo daily for mood stabilization. -Cogentin 0.5mg45m qhs for EPS prevention 3. Treat health problems as indicated. 4. Develop treatment plan to decrease risk of relapse upon discharge and the need for     readmission. 5. Psycho-social education regarding relapse prevention and self care. 6. Health care follow up as needed for medical problems. 7. Restart home medications where appropriate.   Medical Decision Making Problem Points:  Established problem, worsening (2), Review of last therapy session (1) and  Review of psycho-social stressors (1) Data Points:  Order Aims Assessment (2) Review of medication regiment & side effects (2) Review of new medications or change in dosage (2)  I certify that inpatient services furnished can reasonably be expected to improve the patient's condition.   Aldine Chakraborty,MD 08/18/2014, 1:54 PM

## 2014-08-18 NOTE — Progress Notes (Signed)
[place] Group Notes:  (Nursing/MHT/Case Management/Adjunct)  Date:  08/18/2014  Time:  9:34 PM  Type of Therapy:  Group Therapy  Participation Level:  Minimal  Participation Quality:  Appropriate  Affect:  Appropriate  Cognitive:  Appropriate  Insight:  Lacking  Engagement in Group:  Poor  Modes of Intervention:  Socialization and Support  Summary of Progress/Problems: Pt. Stated her energy level was a 9.  Pt. Spoke about her support system.   Lanell Persons 08/18/2014, 9:34 PM

## 2014-08-18 NOTE — BHH Group Notes (Signed)
[facility] Group Notes:  (Clinical Social Work)  08/18/2014  11:15-12:00PM  Summary of Progress/Problems:   The main focus of today's process group was to discuss patients' feelings about hospitalization, the stigma attached to mental health, and sources of motivation to stay well.  We then worked to identify a specific plan to avoid future hospitalizations when discharged from the hospital for this admission.  The patient expressed that she felt terrible today, that she cannot sleep and is very tired.  She said she is in the hospital due to suicidal thoughts, and that she feels she is in the hospital because she needs to be.  She listened for the rest of group attentively but did not contribute.  Type of Therapy:  Group Therapy - Process  Participation Level:  Active  Participation Quality:  Attentive and Drowsy  Affect:  Depressed and Flat  Cognitive:  Oriented  Insight:  Improving  Engagement in Therapy:  Developing/Improving  Modes of Intervention:  Exploration, Discussion  Selmer Dominion, LCSW 08/18/2014, 12:46 PM

## 2014-08-19 LAB — GLUCOSE, CAPILLARY
GLUCOSE-CAPILLARY: 100 mg/dL — AB (ref 70–99)
GLUCOSE-CAPILLARY: 77 mg/dL (ref 70–99)
Glucose-Capillary: 123 mg/dL — ABNORMAL HIGH (ref 70–99)
Glucose-Capillary: 95 mg/dL (ref 70–99)

## 2014-08-19 NOTE — Progress Notes (Signed)
Patient ID: Elizabeth Thompson, female   DOB: 11-19-1951, 63 y.o.   MRN: [id number] [facility] MD Progress Note  08/19/2014 12:08 PM GRETE BOSKO  MRN:  [id number] Subjective: "I slept better last night but still having racing thoughts and feeling depressed.''  Objective: Patient was seen and her chart was reviewed. She reports decreased anxiety, worries, mood swings, paranoia and depressive symptoms. However, she continues to endorse auditory hallucinations,  hearing voices telling her to hurt herself. She denies homicidal ideations and visual hallucinations. Patient has been attending the unit milieu and her medications without verbalizing any adverse reactions. Diagnosis:   DSM5: Schizophrenia Disorders:  Delusional Disorder (297.1) and Psychotic Disorder (298.8) Obsessive-Compulsive Disorders:   Trauma-Stressor Disorders:   Substance/Addictive Disorders:  denies Depressive Disorders:  Major Depressive Disorder - with Psychotic Features (296.24) Total Time spent with patient: 25 minutes  Axis I: Schizoaffective disorder  Axis II: Deferred Axis III:  Past Medical History  Diagnosis Date  . Hypertension   . Bell's palsy   . Glaucoma   . Diabetes mellitus without complication    Axis IV: other psychosocial or environmental problems and problems related to social environment  ADL's:  Intact  Sleep: fair  Appetite:  Fair  Suicidal Ideation: passive  Plan:  denies Homicidal Ideation:  denies AEB (as evidenced by):  Psychiatric Specialty Exam: Physical Exam  Psychiatric: Judgment normal. Her mood appears anxious. Her speech is rapid and/or pressured. She is actively hallucinating. Thought content is paranoid. Cognition and memory are normal. She exhibits a depressed mood. She expresses suicidal ideation.    Review of Systems  Constitutional: Negative.   HENT: Negative.   Eyes: Negative.   Respiratory: Negative.   Cardiovascular: Negative.   Gastrointestinal: Negative.    Genitourinary: Negative.   Musculoskeletal: Negative.   Skin: Negative.   Neurological: Negative.   Endo/Heme/Allergies: Negative.   Psychiatric/Behavioral: Positive for depression and hallucinations. The patient is nervous/anxious and has insomnia.     Blood pressure 123/62, pulse 87, temperature 98.2 F (36.8 C), temperature source Oral, resp. rate 17, height _0  (1.626 m), weight 86.637 kg (191 lb).Body mass index is 32.77 kg/(m^2).  General Appearance: Disheveled  Eye Contact::  Minimal  Speech:  Garbled and Slow  Volume:  Decreased  Mood:  Dysphoric  Affect:  Constricted  Thought Process:  Circumstantial and Disorganized  Orientation:  Full (Time, Place, and Person)  Thought Content:  Delusions and Hallucinations: Auditory  Suicidal Thoughts:  Yes.  without intent/plan  Homicidal Thoughts:  No  Memory:  Immediate;   Fair Recent;   Fair Remote;   Fair  Judgement:  Impaired  Insight:  Lacking  Psychomotor Activity:  Decreased  Concentration:  Fair  Recall:  [employer] of Knowledge:Good  Language: Good  Akathisia:  No  Handed:  Right  AIMS (if indicated):     Assets:  Communication Skills Desire for Improvement Physical Health  Sleep:  Number of Hours: 6.75   Musculoskeletal: Strength & Muscle Tone: within normal limits Gait & Station: normal Patient leans: N/A  Current Medications: Current Facility-Administered Medications  Medication Dose Route Frequency Provider Last Rate Last Dose  . acetaminophen (TYLENOL) tablet 650 mg  650 mg Oral Q6H PRN Shuvon Rankin, NP      . albuterol (PROVENTIL HFA;VENTOLIN HFA) 108 (90 BASE) MCG/ACT inhaler 2 puff  2 puff Inhalation Q6H PRN Shuvon Rankin, NP      . alum & mag hydroxide-simeth (MAALOX/MYLANTA) 200-200-20 MG/5ML suspension 30 mL  30  mL Oral Q4H PRN Shuvon Rankin, NP      . ARIPiprazole (ABILIFY) tablet 10 mg  10 mg Oral QHS Dewaine Morocho      . benztropine (COGENTIN) tablet 0.5 mg  0.5 mg Oral QHS Keigan Girten       . glimepiride (AMARYL) tablet 4 mg  4 mg Oral Q breakfast Shuvon Rankin, NP   4 mg at 08/19/14 0740  . lamoTRIgine (LAMICTAL) tablet 25 mg  25 mg Oral Daily Alexa Blish   25 mg at 08/19/14 0740  . latanoprost (XALATAN) 0.005 % ophthalmic solution 1 drop  1 drop Both Eyes QHS Shuvon Rankin, NP   1 drop at 08/18/14 2152  . lisinopril (PRINIVIL,ZESTRIL) tablet 40 mg  40 mg Oral Daily Shuvon Rankin, NP   40 mg at 08/19/14 0740  . magnesium hydroxide (MILK OF MAGNESIA) suspension 30 mL  30 mL Oral Daily PRN Shuvon Rankin, NP      . metFORMIN (GLUCOPHAGE) tablet 1,000 mg  1,000 mg Oral BID WC Shuvon Rankin, NP   1,000 mg at 08/19/14 0740  . multivitamin with minerals tablet 1 tablet  1 tablet Oral Daily Shuvon Rankin, NP   1 tablet at 08/19/14 0740  . simvastatin (ZOCOR) tablet 40 mg  40 mg Oral Daily Shuvon Rankin, NP   40 mg at 08/19/14 0740  . traZODone (DESYREL) tablet 200 mg  200 mg Oral QHS Shuvon Rankin, NP   200 mg at 08/18/14 2152    Lab Results:  Results for orders placed during the hospital encounter of 08/16/14 (from the past 48 hour(s))  GLUCOSE, CAPILLARY     Status: Abnormal   Collection Time    08/17/14  4:58 PM      Result Value Ref Range   Glucose-Capillary 171 (*) 70 - 99 mg/dL  TSH     Status: None   Collection Time    08/17/14  7:41 PM      Result Value Ref Range   TSH 1.990  0.350 - 4.500 uIU/mL   Comment: Performed at [place], CAPILLARY     Status: Abnormal   Collection Time    08/17/14  8:41 PM      Result Value Ref Range   Glucose-Capillary 206 (*) 70 - 99 mg/dL  GLUCOSE, CAPILLARY     Status: Abnormal   Collection Time    08/18/14  6:06 AM      Result Value Ref Range   Glucose-Capillary 146 (*) 70 - 99 mg/dL  GLUCOSE, CAPILLARY     Status: Abnormal   Collection Time    08/18/14 11:40 AM      Result Value Ref Range   Glucose-Capillary 165 (*) 70 - 99 mg/dL   Comment 1 Documented in Chart     Comment 2 Notify RN    GLUCOSE,  CAPILLARY     Status: Abnormal   Collection Time    08/18/14  5:03 PM      Result Value Ref Range   Glucose-Capillary 185 (*) 70 - 99 mg/dL   Comment 1 Documented in Chart     Comment 2 Notify RN    GLUCOSE, CAPILLARY     Status: Abnormal   Collection Time    08/18/14  9:50 PM      Result Value Ref Range   Glucose-Capillary 174 (*) 70 - 99 mg/dL   Comment 1 Notify RN     Comment 2 Documented in Chart    GLUCOSE,  CAPILLARY     Status: Abnormal   Collection Time    08/19/14  6:03 AM      Result Value Ref Range   Glucose-Capillary 123 (*) 70 - 99 mg/dL   Comment 1 Notify RN     Comment 2 Documented in Chart    GLUCOSE, CAPILLARY     Status: None   Collection Time    08/19/14 11:47 AM      Result Value Ref Range   Glucose-Capillary 95  70 - 99 mg/dL    Physical Findings: AIMS: Facial and Oral Movements Muscles of Facial Expression: None, normal Lips and Perioral Area: None, normal Jaw: None, normal Tongue: None, normal,Extremity Movements Upper (arms, wrists, hands, fingers): None, normal Lower (legs, knees, ankles, toes): None, normal, Trunk Movements Neck, shoulders, hips: None, normal, Overall Severity Severity of abnormal movements (highest score from questions above): None, normal Incapacitation due to abnormal movements: None, normal Patient's awareness of abnormal movements (rate only patient's report): No Awareness, Dental Status Current problems with teeth and/or dentures?: Yes (no teeth) Does patient usually wear dentures?: Yes (don't have with her)  CIWA:  CIWA-Ar Total: 0 COWS:  COWS Total Score: 1  Treatment Plan Summary: Daily contact with patient to assess and evaluate symptoms and progress in treatment Medication management  Plan: 1. Admit for crisis management and stabilization. 2. Medication management to reduce current symptoms to base line and improve the patient's overall level of functioning: -Continue Abilify 33m po qhs mood/psychosis -Continue  Trazodone 2012mpo qhs for insomnia -Lamictal 2537mo daily for mood stabilization. -Cogentin 0.5mg80m qhs for EPS prevention 3. Treat health problems as indicated. 4. Develop treatment plan to decrease risk of relapse upon discharge and the need for     readmission. 5. Psycho-social education regarding relapse prevention and self care. 6. Health care follow up as needed for medical problems. 7. Restart home medications where appropriate.   Medical Decision Making Problem Points:  Established problem, worsening (2), Review of last therapy session (1) and Review of psycho-social stressors (1) Data Points:  Order Aims Assessment (2) Review of medication regiment & side effects (2) Review of new medications or change in dosage (2)  I certify that inpatient services furnished can reasonably be expected to improve the patient's condition.   Bryannah Boston,MD 08/19/2014, 12:08 PM

## 2014-08-19 NOTE — Progress Notes (Signed)
Pt stated that she had a fair day. The first thing she plans to do when she gets home is to eat a good meal.

## 2014-08-19 NOTE — Progress Notes (Signed)
Patient ID: ZYAIR RHEIN, female   DOB: July 04, 1951, 63 y.o.   MRN: [id number] D)  Has spent much of the evening in the dayroom, seems somewhat preoccupied at times and gives very little verbal response, and is slow, thought blocking.  Will be a little more responsive to select peers, but can sit quietly next to them and not say anything, preoccupied with her thoughts.  Initially refused hs meds, but agreed to take them when approached again later, was able to self administer her eye gtts. Went to her room after meds to get ready for bed. A)  Will continue to monitor for safety, continue POC R)  Remains safe on unit.[name]

## 2014-08-19 NOTE — BHH Group Notes (Signed)
[facility] Group Notes:  (Clinical Social Work)  08/19/2014   11:15am-12:00pm  Summary of Progress/Problems:  The main focus of today's process group was to listen to a variety of genres of music and to identify that different types of music provoke different responses.  The patient then was able to identify personally what was soothing for them, as well as energizing.  The patient expressed understanding of concepts, as well as knowledge of how each type of music affected him/her and how this can be used at home as a wellness/recovery tool.  She particularly enjoys the symphony, she stated, and she smile broadly when that was played for her.  She smiled a number of times during group, and expressed appreciation at the end of group.  Type of Therapy:  Music Therapy   Participation Level:  Active  Participation Quality:  Attentive and Sharing  Affect:  Blunted  Cognitive:  Oriented  Insight:  Engaged  Engagement in Therapy:  Engaged  Modes of Intervention:   Activity, Exploration  Selmer Dominion, LCSW 08/19/2014, 12:30pm

## 2014-08-19 NOTE — BHH Group Notes (Signed)
[facility] Group Notes:  (Nursing/MHT/Case Management/Adjunct)  Date:  08/19/2014  Time: 0920 and 0940 am  Type of Therapy:  Psychoeducational Skills  Participation Level:  Minimal  Participation Quality:  Inattentive  Affect:  Not Congruent  Cognitive:  Confused  Insight:  Lacking  Engagement in Group:  Limited  Modes of Intervention:  Support  Summary of Progress/Problems:  Zipporah Plants 08/19/2014, 1:52 PM

## 2014-08-19 NOTE — Progress Notes (Signed)
Patient ID: [name], female   DOB: [birth year], 63 y.o.   MRN: [id number] D. Pt presents with depressed mood, affect congruent again today. Elizabeth Thompson continues to appear disorganized, with thought blocking. She stares at staff upon approach, and takes several moments to respond. This morning she states '' I'm doing fine. I rested better '' She appears very dazed at times. She has been compliant with medications . A. Medications given as ordered. Support and encouragement provided. Discussed above information with Dr. Darleene Cleaver. R. Patient is safe. Will continue to monitor q 15 minutes for safety.

## 2014-08-19 NOTE — Clinical Social Work Note (Signed)
Clinical Social Work Note  At [employer] request, CSW looked into her belongings list to ascertain if her clothes had been placed in a locker.  The only thing in the locker is bobby pins.  CSW retrieved clothing for her from the clothing closet.  Selmer Dominion, LCSW 08/19/2014, 12:39 PM

## 2014-08-20 LAB — GLUCOSE, CAPILLARY
GLUCOSE-CAPILLARY: 67 mg/dL — AB (ref 70–99)
Glucose-Capillary: 96 mg/dL (ref 70–99)
Glucose-Capillary: 61 mg/dL — ABNORMAL LOW (ref 70–99)
Glucose-Capillary: 125 mg/dL — ABNORMAL HIGH (ref 70–99)
Glucose-Capillary: 105 mg/dL — ABNORMAL HIGH (ref 70–99)

## 2014-08-20 MED ORDER — ONDANSETRON 4 MG PO TBDP
4.0000 mg | ORAL_TABLET | Freq: Three times a day (TID) | ORAL | Status: DC | PRN
  Administered 2014-08-20: 4 mg via ORAL

## 2014-08-20 MED ORDER — ONDANSETRON 4 MG PO TBDP
ORAL_TABLET | ORAL | Status: AC
  Filled 2014-08-20: qty 1

## 2014-08-20 MED ORDER — ARIPIPRAZOLE 15 MG PO TABS: 15.0000 mg | ORAL_TABLET | Freq: Every day | ORAL | Status: DC

## 2014-08-20 MED ORDER — ARIPIPRAZOLE 10 MG PO TABS
10.0000 mg | ORAL_TABLET | Freq: Every day | ORAL | Status: DC
  Administered 2014-08-20: 10 mg via ORAL
  Filled 2014-08-20 (×2): qty 1

## 2014-08-20 MED ORDER — TRAZODONE HCL 100 MG PO TABS
100.0000 mg | ORAL_TABLET | Freq: Every day | ORAL | Status: DC
  Administered 2014-08-20 – 2014-08-21 (×2): 100 mg via ORAL
  Filled 2014-08-20 (×3): qty 1

## 2014-08-20 MED ORDER — LAMOTRIGINE 25 MG PO TABS
25.0000 mg | ORAL_TABLET | Freq: Every evening | ORAL | Status: DC
  Administered 2014-08-21: 25 mg via ORAL
  Filled 2014-08-20 (×2): qty 1

## 2014-08-20 NOTE — Progress Notes (Signed)
Patient ID: Elizabeth Thompson, female   DOB: [birth year], 63 y.o.   MRN: [id number] D: Patient reluctant to take meds this AM stating "they aren't good for me".  Eventually took medication but was extremely irritable and unhappy about having to take them.  Interacts very little with staff or peers. Had episode this AM when she became drowsy and came to med window and laid head in window.  Was put in a wheelchair and taken to her room.  BP was 92/48 pulse 82 CBG 99.  Patient fell asleep at that time. A: Patient given emotional support from RN. Patient given medications per MD orders. Patient encouraged to attend groups and unit activities. Patient encouraged to come to staff with any questions or concerns.  R: Patient remains irritable.Minimal participation in groups.Will continue to monitor patient for safety.

## 2014-08-20 NOTE — Progress Notes (Signed)
Pt c/o feeling nauseated. Dr. Parke Poisson was phoned to get an order for zofran.

## 2014-08-20 NOTE — Progress Notes (Signed)
Patient ID: Elizabeth Thompson, female   DOB: 11/19/1951, 63 y.o.   MRN: [id number] [facility] MD Progress Note  08/20/2014 12:10 PM Elizabeth Thompson  MRN:  [id number] Subjective: "Patient found in bed ,too drowsy ,minimal response when questions asked."  Objective: Patient was seen and her chart was reviewed. Patient was found in bed sleeping this AM. Patient sat up on her bed but continued to be drowsy ,hard to keep her eyes open ,responding only minimally to questions asked. Patient denies any AH/VH today and has passive SI. Patient was able to tell her full name as well as report her currect date of birth and also report that she was in the hospital and that this is September ,2015.    Patient per staff report was found in hallway after a while ,appeared to be very drowsy and was about to fall and hence was taken to her room in a wheel chair. Her BP was found to be 98/42 ,on repeat 107/69/ CBG -wnl. Pulse wnl. She was encouraged po fluids. Her BP medication is on hold if she continues to have low BP.    Diagnosis:   DSM5: Primary psychiatric diagnosis:  Schizoaffective disorder,depressed type ,multiple episodes ,currently in acute episode   Secondary psychiatric diagnosis:  Tobacco use disorder   Non psychiatric diagnosis:  Hypertension  Glaucoma  DM     Total Time spent with patient: 25 minutes   Past Medical History  Diagnosis Date  . Hypertension   . Bell's palsy   . Glaucoma   . Diabetes mellitus without complication    ADL's:  Intact  Sleep: good  Appetite:  Fair  Suicidal Ideation: passive  Plan:  denies Homicidal Ideation:  denies AEB (as evidenced by):  Psychiatric Specialty Exam: Physical Exam  Psychiatric: Judgment normal. Her mood appears anxious. Her speech is rapid and/or pressured. Thought content is paranoid and delusional. Cognition and memory are normal. She exhibits a depressed mood. She expresses suicidal (passive) ideation.    Review of Systems   Constitutional: Negative.   HENT: Negative.   Eyes: Negative.   Respiratory: Negative.   Cardiovascular: Negative.   Gastrointestinal: Negative.   Genitourinary: Negative.   Musculoskeletal: Negative.   Skin: Negative.   Neurological: Negative.   Endo/Heme/Allergies: Negative.   Psychiatric/Behavioral: Positive for depression. The patient is nervous/anxious.     Blood pressure 102/69, pulse 83, temperature 98.7 F (37.1 C), temperature source Oral, resp. rate 16, height _0  (1.626 m), weight 86.637 kg (191 lb).Body mass index is 32.77 kg/(m^2).  General Appearance: Disheveled  Eye Contact::  Minimal  Speech:  Garbled and Slow  Volume:  Decreased  Mood:  Dysphoric  Affect:  Constricted  Thought Process:  Circumstantial and Disorganized  Orientation:  Full (Time, Place, and Person)  Thought Content:  Delusions  Suicidal Thoughts:  Yes.  without intent/plan passive  Homicidal Thoughts:  No  Memory:  Immediate;   Fair Recent;   Fair Remote;   Fair  Judgement:  Impaired  Insight:  Lacking  Psychomotor Activity:  Decreased  Concentration:  Fair  Recall:  [employer] of Knowledge:Good  Language: Good  Akathisia:  No  Handed:  Right  AIMS (if indicated):     Assets:  Communication Skills Desire for Improvement Physical Health  Sleep:  Number of Hours: 6.5   Musculoskeletal: Strength & Muscle Tone: within normal limits Gait & Station: normal Patient leans: N/A  Current Medications: Current Facility-Administered Medications  Medication Dose Route Frequency Provider  Last Rate Last Dose  . acetaminophen (TYLENOL) tablet 650 mg  650 mg Oral Q6H PRN Shuvon Rankin, NP      . albuterol (PROVENTIL HFA;VENTOLIN HFA) 108 (90 BASE) MCG/ACT inhaler 2 puff  2 puff Inhalation Q6H PRN Shuvon Rankin, NP      . alum & mag hydroxide-simeth (MAALOX/MYLANTA) 200-200-20 MG/5ML suspension 30 mL  30 mL Oral Q4H PRN Shuvon Rankin, NP      . ARIPiprazole (ABILIFY) tablet 15 mg  15 mg Oral QHS  Jeniece Hannis, MD      . benztropine (COGENTIN) tablet 0.5 mg  0.5 mg Oral QHS Mojeed Akintayo   0.5 mg at 08/19/14 2233  . glimepiride (AMARYL) tablet 4 mg  4 mg Oral Q breakfast Shuvon Rankin, NP   4 mg at 08/20/14 0746  . [START ON 08/21/2014] lamoTRIgine (LAMICTAL) tablet 25 mg  25 mg Oral QPM Blaine Hari, MD      . latanoprost (XALATAN) 0.005 % ophthalmic solution 1 drop  1 drop Both Eyes QHS Shuvon Rankin, NP   1 drop at 08/19/14 2231  . lisinopril (PRINIVIL,ZESTRIL) tablet 40 mg  40 mg Oral Daily Shuvon Rankin, NP   40 mg at 08/20/14 0746  . magnesium hydroxide (MILK OF MAGNESIA) suspension 30 mL  30 mL Oral Daily PRN Shuvon Rankin, NP      . metFORMIN (GLUCOPHAGE) tablet 1,000 mg  1,000 mg Oral BID WC Shuvon Rankin, NP   1,000 mg at 08/20/14 0746  . multivitamin with minerals tablet 1 tablet  1 tablet Oral Daily Shuvon Rankin, NP   1 tablet at 08/20/14 0746  . simvastatin (ZOCOR) tablet 40 mg  40 mg Oral Daily Shuvon Rankin, NP   40 mg at 08/20/14 0746  . traZODone (DESYREL) tablet 100 mg  100 mg Oral QHS Ursula Alert, MD        Lab Results:  Results for orders placed during the hospital encounter of 08/16/14 (from the past 48 hour(s))  GLUCOSE, CAPILLARY     Status: Abnormal   Collection Time    08/18/14  5:03 PM      Result Value Ref Range   Glucose-Capillary 185 (*) 70 - 99 mg/dL   Comment 1 Documented in Chart     Comment 2 Notify RN    GLUCOSE, CAPILLARY     Status: Abnormal   Collection Time    08/18/14  9:50 PM      Result Value Ref Range   Glucose-Capillary 174 (*) 70 - 99 mg/dL   Comment 1 Notify RN     Comment 2 Documented in Chart    GLUCOSE, CAPILLARY     Status: Abnormal   Collection Time    08/19/14  6:03 AM      Result Value Ref Range   Glucose-Capillary 123 (*) 70 - 99 mg/dL   Comment 1 Notify RN     Comment 2 Documented in Chart    GLUCOSE, CAPILLARY     Status: None   Collection Time    08/19/14 11:47 AM      Result Value Ref Range    Glucose-Capillary 95  70 - 99 mg/dL  GLUCOSE, CAPILLARY     Status: Abnormal   Collection Time    08/19/14  5:05 PM      Result Value Ref Range   Glucose-Capillary 100 (*) 70 - 99 mg/dL   Comment 1 Notify RN    GLUCOSE, CAPILLARY     Status: None  Collection Time    08/19/14  9:02 PM      Result Value Ref Range   Glucose-Capillary 77  70 - 99 mg/dL  GLUCOSE, CAPILLARY     Status: None   Collection Time    08/20/14  9:58 AM      Result Value Ref Range   Glucose-Capillary 96  70 - 99 mg/dL  GLUCOSE, CAPILLARY     Status: Abnormal   Collection Time    08/20/14 11:54 AM      Result Value Ref Range   Glucose-Capillary 61 (*) 70 - 99 mg/dL    Physical Findings: AIMS: Facial and Oral Movements Muscles of Facial Expression: None, normal Lips and Perioral Area: None, normal Jaw: None, normal Tongue: None, normal,Extremity Movements Upper (arms, wrists, hands, fingers): None, normal Lower (legs, knees, ankles, toes): None, normal, Trunk Movements Neck, shoulders, hips: None, normal, Overall Severity Severity of abnormal movements (highest score from questions above): None, normal Incapacitation due to abnormal movements: None, normal Patient's awareness of abnormal movements (rate only patient's report): No Awareness, Dental Status Current problems with teeth and/or dentures?: Yes (no teeth) Does patient usually wear dentures?: Yes (don't have with her)  CIWA:  CIWA-Ar Total: 0 COWS:  COWS Total Score: 1  Treatment Plan Summary: Daily contact with patient to assess and evaluate symptoms and progress in treatment Medication management  Plan: 1.Continue  crisis management and stabilization. 2. Medication management to reduce current symptoms to base line and improve the patient's overall level of functioning: -Continue  Abilify 29m po qhs mood/psychosis -Reduce Trazodone to 1013mpo qhs for insomnia,since patient appeared to be very drowsy this AM. -Lamictal 2559mo daily for  mood stabilization to be changed to QPM. -Cogentin 0.5mg59m qhs for EPS prevention 3. Treat health problems as indicated.Will hold Lisinopril -until her BP is better. Will continue to monitor. 4. Develop treatment plan to decrease risk of relapse upon discharge and the need for     readmission. 5. Psycho-social education regarding relapse prevention and self care. 6. Health care follow up as needed for medical problems. 7. Restart home medications where appropriate.   Medical Decision Making Problem Points:  Established problem, worsening (2), Review of last therapy session (1) and Review of psycho-social stressors (1) Data Points:  Order Aims Assessment (2) Review of medication regiment & side effects (2) Review of new medications or change in dosage (2)  I certify that inpatient services furnished can reasonably be expected to improve the patient's condition.   Caralynn Gelber,MD 08/20/2014, 12:10 PM

## 2014-08-20 NOTE — BHH Group Notes (Signed)
[facility] LCSW Aftercare Discharge Planning Group Note   08/20/2014 2:01 PM  Participation Quality:  Did not attend    Elizabeth Thompson

## 2014-08-21 LAB — GLUCOSE, CAPILLARY
GLUCOSE-CAPILLARY: 140 mg/dL — AB (ref 70–99)
Glucose-Capillary: 119 mg/dL — ABNORMAL HIGH (ref 70–99)
Glucose-Capillary: 146 mg/dL — ABNORMAL HIGH (ref 70–99)
Glucose-Capillary: 128 mg/dL — ABNORMAL HIGH (ref 70–99)

## 2014-08-21 MED ORDER — PANTOPRAZOLE SODIUM 20 MG PO TBEC
20.0000 mg | DELAYED_RELEASE_TABLET | Freq: Two times a day (BID) | ORAL | Status: DC
  Administered 2014-08-21 – 2014-08-27 (×13): 20 mg via ORAL
  Filled 2014-08-21 (×18): qty 1

## 2014-08-21 MED ORDER — MENTHOL 3 MG MT LOZG
1.0000 | LOZENGE | Freq: Four times a day (QID) | OROMUCOSAL | Status: DC
  Administered 2014-08-21 (×2): 3 mg via ORAL

## 2014-08-21 MED ORDER — ARIPIPRAZOLE 15 MG PO TABS
15.0000 mg | ORAL_TABLET | Freq: Every day | ORAL | Status: DC
  Administered 2014-08-21 – 2014-08-23 (×3): 15 mg via ORAL
  Filled 2014-08-21 (×4): qty 1

## 2014-08-21 NOTE — Progress Notes (Signed)
   Pt laying in bed resting with eyes closed. Respirations even and unlabored. No distress noted. Monitor every 15 min for safety. Pt remains safe.

## 2014-08-21 NOTE — BHH Suicide Risk Assessment (Signed)
[facility] INPATIENT:  Family/Significant Other Suicide Prevention Education  Suicide Prevention Education:  Education Completed; Madelin Headings (pt's niece) [phone] has been identified by the patient as the family member/significant other with whom the patient will be residing, and identified as the person(s) who will aid the patient in the event of a mental health crisis (suicidal ideations/suicide attempt).  With written consent from the patient, the family member/significant other has been provided the following suicide prevention education, prior to the and/or following the discharge of the patient.  The suicide prevention education provided includes the following:  Suicide risk factors  Suicide prevention and interventions  National Suicide Hotline telephone number  [facility] assessment telephone number  [facility] Emergency Assistance [place] and/or Residential Mobile Crisis Unit telephone number  Request made of family/significant other to:  Remove weapons (e.g., guns, rifles, knives), all items previously/currently identified as safety concern.    Remove drugs/medications (over-the-counter, prescriptions, illicit drugs), all items previously/currently identified as a safety concern.  The family member/significant other verbalizes understanding of the suicide prevention education information provided.  The family member/significant other agrees to remove the items of safety concern listed above.  Smart, Westly Hinnant LCSWA 08/21/2014, 1:29 PM

## 2014-08-21 NOTE — Progress Notes (Signed)
Pt is pleasant and cooperative s. She has been in the dayroom with the other pts.

## 2014-08-21 NOTE — Progress Notes (Signed)
Adult Psychoeducational Group Note  Date:  08/21/2014 Time:  10:14 PM  Group Topic/Focus:  Wrap-Up Group:   The focus of this group is to help patients review their daily goal of treatment and discuss progress on daily workbooks.  Participation Level:  Minimal  Participation Quality:  Appropriate  Affect:  Flat  Cognitive:  Lacking  Insight: Limited  Engagement in Group:  Limited  Modes of Intervention:  Socialization   Additional Comments:  Patient attended group tonight with very little participation. The patient reports that she did not want to speak in group tonight.    Salley Scarlet [facility] 08/21/2014, 10:14 PM

## 2014-08-21 NOTE — Progress Notes (Signed)
D:Pt presents with mild confusion. She is cooperative on the unit attending groups and participating with encouragement. A:Offered support, encouragement and 15 minute checks. R:Pt denies si and hi. Safety maintained on the unit.

## 2014-08-21 NOTE — ED Provider Notes (Signed)
Medical screening examination/treatment/procedure(s) were conducted as a shared visit with non-physician practitioner(s) and myself.  I personally evaluated the patient during the encounter.  Pt c/o depression w SI.  Hx same. Denies attempt to harm self. Pt alert, content. Depressed mood. Labs. Psych team consulted, dispo per psych team.    Mirna Mires, MD 08/21/14 1031

## 2014-08-21 NOTE — Tx Team (Signed)
  Interdisciplinary Treatment Plan Update   Date Reviewed:  08/21/2014  Time Reviewed:  9:00AM Progress in Treatment:   Attending groups: Yes Participating in groups: Yes Taking medication as prescribed: Yes  Tolerating medication: Yes Family/Significant other contact made: Not yet. SPE required for this pt.  Patient understands diagnosis: Yes AEB asking for help with SI secondary to depression Discussing patient identified problems/goals with staff: Yes  See initial care plan Medical problems stabilized or resolved: Yes Denies suicidal/homicidal ideation: Yes  In tx team Patient has not harmed self or others: Yes  For review of initial/current patient goals, please see plan of care.  Estimated Length of Stay:  2-3 days   Reason for Continuation of Hospitalization: Depression/Mood stabilization Med management   New Problems/Goals identified:  N/A  Discharge Plan or Barriers:   return home with her niece, follow up outpt at [facility]. Pt wants to make her own appt with PCP, who she was seeing for mental health meds prior to admission.   Additional Comments:  Pt reports that she is feeling clearer mentally, but reports continued difficulty with sleep. "I wake up all night and when I wake up in the morning, it takes me an hour before I feel awake and clear headed."   Attendees:  Signature: Steva Colder, MD 08/21/2014 9:00AM  Signature: Ripley Fraise, LCSW 08/21/2014 9:00AM  Signature: Maxie Better, LCSWA   08/21/2014 9:00AM  Signature: Jan RN  08/21/2014 9:00AM  Signature:  08/21/2014   Signature:  08/21/2014   Signature:   08/21/2014   Signature:    Signature:    Signature:    Signature:    Signature:    Signature:      Scribe for Treatment Team:   Maxie Better, LCSWA 9:00AM

## 2014-08-21 NOTE — Progress Notes (Signed)
Patient ID: Elizabeth Thompson, female   DOB: 04-Nov-1951, 63 y.o.   MRN: [id number] [facility] MD Progress Note  08/21/2014 11:49 AM Elizabeth Thompson  MRN:  [id number] Subjective: "Patient reports she feels much better today but do not know if she is ready to be discharged since there is a "red sticker on her door"  Objective: Patient was seen and her chart was reviewed. Patient today appears to be more pleasant ,cheerful. However continues to be paranoid and suspicious ,especially about the 'red sticker " on her door.  Patient denies any AH/VH today and denies SI/HI . She denies any side effects of medications. Her sleep and appetite is improved.     Per staff patient has been doing well after her medication timings were changed . Her BP medication is still on hold. Will continue to monitor.   Diagnosis:   DSM5: Primary psychiatric diagnosis:  Schizoaffective disorder,depressed type ,multiple episodes ,currently in acute episode   Secondary psychiatric diagnosis:  Tobacco use disorder   Non psychiatric diagnosis:  Hypertension  Glaucoma  DM     Total Time spent with patient: 25 minutes   Past Medical History  Diagnosis Date  . Hypertension   . Bell's palsy   . Glaucoma   . Diabetes mellitus without complication    ADL's:  Intact  Sleep: good  Appetite:  Fair  Suicidal Ideation: denies Plan:  denies Homicidal Ideation:  denies AEB (as evidenced by):  Psychiatric Specialty Exam: Physical Exam  Psychiatric: Judgment normal. Her mood appears anxious. Her speech is rapid and/or pressured. Thought content is paranoid and delusional. Cognition and memory are normal. She exhibits a depressed mood. She expresses suicidal (passive) ideation.    Review of Systems  Constitutional: Negative.   HENT: Negative.   Eyes: Negative.   Respiratory: Negative.   Cardiovascular: Negative.   Gastrointestinal: Negative.   Genitourinary: Negative.   Musculoskeletal: Negative.   Skin:  Negative.   Neurological: Negative.   Endo/Heme/Allergies: Negative.   Psychiatric/Behavioral: Positive for depression. The patient is nervous/anxious.     Blood pressure 115/63, pulse 95, temperature 98.1 F (36.7 C), temperature source Oral, resp. rate 18, height _0  (1.626 m), weight 86.637 kg (191 lb).Body mass index is 32.77 kg/(m^2).  General Appearance: Casual  Eye Contact::  Good  Speech:  Clear and Coherent  Volume:  Decreased  Mood:  Anxious improving  Affect:  Congruent  Thought Process:  Circumstantial and Disorganized improving  Orientation:  Full (Time, Place, and Person)  Thought Content:  Delusions  Suicidal Thoughts:  No   Homicidal Thoughts:  No  Memory:  Immediate;   Fair Recent;   Fair Remote;   Fair  Judgement:  Impaired  Insight:  Lacking  Psychomotor Activity:  Decreased  Concentration:  Fair  Recall:  [employer] of Knowledge:Good  Language: Good  Akathisia:  No  Handed:  Right  AIMS (if indicated):     Assets:  Communication Skills Desire for Improvement Physical Health  Sleep:  Number of Hours: 6.5   Musculoskeletal: Strength & Muscle Tone: within normal limits Gait & Station: normal Patient leans: N/A  Current Medications: Current Facility-Administered Medications  Medication Dose Route Frequency Provider Last Rate Last Dose  . acetaminophen (TYLENOL) tablet 650 mg  650 mg Oral Q6H PRN Shuvon Rankin, NP      . albuterol (PROVENTIL HFA;VENTOLIN HFA) 108 (90 BASE) MCG/ACT inhaler 2 puff  2 puff Inhalation Q6H PRN Shuvon Rankin, NP      .  alum & mag hydroxide-simeth (MAALOX/MYLANTA) 200-200-20 MG/5ML suspension 30 mL  30 mL Oral Q4H PRN Shuvon Rankin, NP      . ARIPiprazole (ABILIFY) tablet 15 mg  15 mg Oral QHS Almando Brawley, MD      . benztropine (COGENTIN) tablet 0.5 mg  0.5 mg Oral QHS Mojeed Akintayo   0.5 mg at 08/20/14 2122  . glimepiride (AMARYL) tablet 4 mg  4 mg Oral Q breakfast Shuvon Rankin, NP   4 mg at 08/21/14 0812  .  lamoTRIgine (LAMICTAL) tablet 25 mg  25 mg Oral QPM Cordarro Spinnato, MD      . latanoprost (XALATAN) 0.005 % ophthalmic solution 1 drop  1 drop Both Eyes QHS Shuvon Rankin, NP   1 drop at 08/20/14 2122  . lisinopril (PRINIVIL,ZESTRIL) tablet 40 mg  40 mg Oral Daily Shuvon Rankin, NP   40 mg at 08/20/14 0746  . magnesium hydroxide (MILK OF MAGNESIA) suspension 30 mL  30 mL Oral Daily PRN Shuvon Rankin, NP      . metFORMIN (GLUCOPHAGE) tablet 1,000 mg  1,000 mg Oral BID WC Shuvon Rankin, NP   1,000 mg at 08/21/14 0811  . multivitamin with minerals tablet 1 tablet  1 tablet Oral Daily Shuvon Rankin, NP   1 tablet at 08/21/14 0811  . ondansetron (ZOFRAN-ODT) disintegrating tablet 4 mg  4 mg Oral Q8H PRN Benjamine Mola, FNP   4 mg at 08/20/14 1915  . pantoprazole (PROTONIX) EC tablet 20 mg  20 mg Oral BID AC Ursula Alert, MD   20 mg at 08/21/14 0959  . simvastatin (ZOCOR) tablet 40 mg  40 mg Oral Daily Shuvon Rankin, NP   40 mg at 08/21/14 0811  . traZODone (DESYREL) tablet 100 mg  100 mg Oral QHS Ursula Alert, MD   100 mg at 08/20/14 2122    Lab Results:  Results for orders placed during the hospital encounter of 08/16/14 (from the past 48 hour(s))  GLUCOSE, CAPILLARY     Status: Abnormal   Collection Time    08/19/14  5:05 PM      Result Value Ref Range   Glucose-Capillary 100 (*) 70 - 99 mg/dL   Comment 1 Notify RN    GLUCOSE, CAPILLARY     Status: None   Collection Time    08/19/14  9:02 PM      Result Value Ref Range   Glucose-Capillary 77  70 - 99 mg/dL  GLUCOSE, CAPILLARY     Status: Abnormal   Collection Time    08/20/14  5:57 AM      Result Value Ref Range   Glucose-Capillary 67 (*) 70 - 99 mg/dL   Comment 1 Notify RN    GLUCOSE, CAPILLARY     Status: None   Collection Time    08/20/14  9:58 AM      Result Value Ref Range   Glucose-Capillary 96  70 - 99 mg/dL  GLUCOSE, CAPILLARY     Status: Abnormal   Collection Time    08/20/14 11:54 AM      Result Value Ref Range    Glucose-Capillary 61 (*) 70 - 99 mg/dL  GLUCOSE, CAPILLARY     Status: Abnormal   Collection Time    08/20/14  4:59 PM      Result Value Ref Range   Glucose-Capillary 125 (*) 70 - 99 mg/dL  GLUCOSE, CAPILLARY     Status: Abnormal   Collection Time    08/20/14  9:04  PM      Result Value Ref Range   Glucose-Capillary 105 (*) 70 - 99 mg/dL   Comment 1 Notify RN    GLUCOSE, CAPILLARY     Status: Abnormal   Collection Time    08/21/14  6:07 AM      Result Value Ref Range   Glucose-Capillary 140 (*) 70 - 99 mg/dL  GLUCOSE, CAPILLARY     Status: Abnormal   Collection Time    08/21/14 10:58 AM      Result Value Ref Range   Glucose-Capillary 119 (*) 70 - 99 mg/dL   Comment 1 Documented in Chart     Comment 2 Notify RN      Physical Findings: AIMS: Facial and Oral Movements Muscles of Facial Expression: None, normal Lips and Perioral Area: None, normal Jaw: None, normal Tongue: None, normal,Extremity Movements Upper (arms, wrists, hands, fingers): None, normal Lower (legs, knees, ankles, toes): None, normal, Trunk Movements Neck, shoulders, hips: None, normal, Overall Severity Severity of abnormal movements (highest score from questions above): None, normal Incapacitation due to abnormal movements: None, normal Patient's awareness of abnormal movements (rate only patient's report): No Awareness, Dental Status Current problems with teeth and/or dentures?: Yes (no teeth) Does patient usually wear dentures?: Yes (don't have with her)  CIWA:  CIWA-Ar Total: 0 COWS:  COWS Total Score: 1  Treatment Plan Summary: Daily contact with patient to assess and evaluate symptoms and progress in treatment Medication management  Plan: 1.Continue  crisis management and stabilization. 2. Medication management to reduce current symptoms to base line and improve the patient's overall level of functioning: -Will increase Abilify to 80m po qhs mood/psychosis -Continue Trazodone to 1053mpo qhs for  insomnia. Would be cautious in increasing the dose since she was too sedated on higher dose. -Lamictal 2536mo daily for mood stabilization changed to QPM -2/2 sedation in the AM. -Cogentin 0.5mg61m qhs for EPS prevention 3. Treat health problems as indicated.Will hold Lisinopril -until her BP is better. Will continue to monitor. 4. Develop treatment plan to decrease risk of relapse upon discharge and the need for     readmission. 5. Psycho-social education regarding relapse prevention and self care. 6. Health care follow up as needed for medical problems. 7. Restart home medications where appropriate.   Medical Decision Making Problem Points:  Established problem, worsening (2), Review of last therapy session (1) and Review of psycho-social stressors (1) Data Points:  Order Aims Assessment (2) Review of medication regiment & side effects (2)  I certify that inpatient services furnished can reasonably be expected to improve the patient's condition.   Marshon Bangs,MD 08/21/2014, 11:49 AM

## 2014-08-21 NOTE — BHH Group Notes (Signed)
[place] LCSW Group Therapy  08/21/2014 1:15 pm  Type of Therapy: Process Group Therapy  Participation Level:  Active  Participation Quality:  Appropriate  Affect:  Flat  Cognitive:  Oriented  Insight:  Improving  Engagement in Group:  Limited  Engagement in Therapy:  Limited  Modes of Intervention:  Activity, Clarification, Education, Problem-solving and Support  Summary of Progress/Problems: Today's group addressed the issue of overcoming obstacles.  Patients were asked to identify their biggest obstacle post d/c that stands in the way of their on-going success, and then problem solve as to how to manage this.  Limited participation.  Stated that her biggest obstacle is "keeping my head above water and not drowning."  Declined to elaborate.  Became tearful.  Appreciated the positive support of others.  Trish Mage 08/21/2014   12:41 PM

## 2014-08-22 ENCOUNTER — Ambulatory Visit: Payer: Self-pay

## 2014-08-22 LAB — GLUCOSE, CAPILLARY
GLUCOSE-CAPILLARY: 119 mg/dL — AB (ref 70–99)
GLUCOSE-CAPILLARY: 67 mg/dL — AB (ref 70–99)
Glucose-Capillary: 77 mg/dL (ref 70–99)
Glucose-Capillary: 143 mg/dL — ABNORMAL HIGH (ref 70–99)
Glucose-Capillary: 110 mg/dL — ABNORMAL HIGH (ref 70–99)

## 2014-08-22 MED ORDER — ADULT MULTIVITAMIN W/MINERALS CH: 1.0000 | ORAL_TABLET | Freq: Every day | ORAL | Status: DC

## 2014-08-22 MED ORDER — DONEPEZIL HCL 5 MG PO TABS
5.0000 mg | ORAL_TABLET | Freq: Every day | ORAL | Status: DC
  Administered 2014-08-22 – 2014-08-26 (×5): 5 mg via ORAL
  Filled 2014-08-22 (×5): qty 1
  Filled 2014-08-22: qty 4
  Filled 2014-08-22 (×2): qty 1

## 2014-08-22 MED ORDER — MIRTAZAPINE 7.5 MG PO TABS
7.5000 mg | ORAL_TABLET | Freq: Every day | ORAL | Status: DC
  Administered 2014-08-22 – 2014-08-26 (×5): 7.5 mg via ORAL
  Filled 2014-08-22 (×3): qty 1
  Filled 2014-08-22: qty 4
  Filled 2014-08-22 (×3): qty 1

## 2014-08-22 MED ORDER — GLUCERNA SHAKE PO LIQD
237.0000 mL | Freq: Three times a day (TID) | ORAL | Status: DC
  Administered 2014-08-22 – 2014-08-24 (×6): 237 mL via ORAL

## 2014-08-22 MED ORDER — MENTHOL 3 MG MT LOZG: 1.0000 | LOZENGE | OROMUCOSAL | Status: DC | PRN

## 2014-08-22 MED ORDER — LAMOTRIGINE 25 MG PO TABS
50.0000 mg | ORAL_TABLET | Freq: Every evening | ORAL | Status: DC
  Administered 2014-08-22: 50 mg via ORAL
  Filled 2014-08-22 (×3): qty 2

## 2014-08-22 MED ORDER — LISINOPRIL 20 MG PO TABS
20.0000 mg | ORAL_TABLET | Freq: Every day | ORAL | Status: DC
  Filled 2014-08-22 (×2): qty 1

## 2014-08-22 NOTE — Progress Notes (Signed)
Patient ID: Elizabeth Thompson, female   DOB: [birth year], 63 y.o.   MRN: [id number]  D: Patient is first seen at the medication window. Patient's affect is anxious and mood is depressed. Pt. Denies SI/HI and A/V Hallucinations to this Probation officer. Patient does not report any pain or discomfort at this time. Before lunch patient reported that she cannot remember certain things. Patient is oriented to self, time, and situation. Patient's bags are packed in her room however writer informed patient that she is not discharging today. Patient verbalized understanding. Writer could not find patient's daily inventory sheet and patient did not turn in sheet. Patient's CBG was checked before lunch and was considered low (67). Patient received Glucerna shortly before this cbg was taken. Patient was requested to finish drinking her Glucerna supplement. Her CBG was checked again and patient's glucose had raised to WDL (77). Writer will continue to monitor this. MD was notified of this situation.  A: Support and encouragement provided to the patient to come to writer with any needs. Scheduled medications administered to patient. BP medication was not given due to BP being too low, MD was notified of this.  R: Patient is receptive and cooperative but minimal and forwards little to this Probation officer. Patient is seen in the milieu speaking with peers and is attending groups. Q15 minute checks are maintained for safety.

## 2014-08-22 NOTE — Plan of Care (Signed)
Problem: Alteration in mood Goal: LTG-Patient reports reduction in suicidal thoughts (Patient reports reduction in suicidal thoughts and is able to verbalize a safety plan for whenever patient is feeling suicidal)  Outcome: Progressing Patient reports no SI today to this Probation officer.

## 2014-08-22 NOTE — Progress Notes (Signed)
Adult Psychoeducational Group Note  Date:  08/22/2014 Time:  9:15 PM  Group Topic/Focus:  Wrap-Up Group:   The focus of this group is to help patients review their daily goal of treatment and discuss progress on daily workbooks.  Participation Level:  None  Participation Quality:  Inattentive  Affect:  Flat  Cognitive:  Confused  Insight: Lacking  Engagement in Group:  None  Modes of Intervention:    Additional Comments: The patient was in group but did not participate.  Nash Shearer 08/22/2014, 9:15 PM

## 2014-08-22 NOTE — Progress Notes (Signed)
Per pervious RN, pt's daughter called to get info. However, RN was informed not to share information with the daughter. Daughter's name is Galilee Pierron [phone]

## 2014-08-22 NOTE — Progress Notes (Signed)
Patient ID: Elizabeth Thompson, female   DOB: [birth year], 63 y.o.   MRN: [id number]   D: Pt wouldn't engage the writer in conversation about her day or discussions she's had with her doctors. However, pt did question her trazodone. Stated, "I usually take 2. No wonder I haven't been sleeping good".  Writer informed pt that if she had problems sleeping have the mht notify the Probation officer.  A:  Support and encouragement was offered. 15 min checks continued for safety.  R: Pt remains safe.

## 2014-08-22 NOTE — Progress Notes (Signed)
Patient ID: Elizabeth Thompson, female   DOB: 1951-03-24, 64 y.o.   MRN: [id number] [facility] MD Progress Note  08/22/2014 11:31 AM Elizabeth Thompson  MRN:  [id number] Subjective: "Patient reports she is OK ,wants to know if she can get two pills instead of one at bedtime".  Objective: Patient was seen and her chart was reviewed. Patient is calm and cooperative . She continues to complain of being forgetful. Reports sleep as off and that she woke up up last night.  Patient denies any AH/VH today and denies SI/HI . She denies any side effects of medications.    Per staff patient has been attending groups ,but unable to elaborate during the groups about how she feels but describes herself as being unable to keep herself from being drowned under water.   Diagnosis:   DSM5: Primary psychiatric diagnosis:  Schizoaffective disorder,depressed type ,multiple episodes ,currently in acute episode   Secondary psychiatric diagnosis:  Unspecified Neurocognitive disorder Tobacco use disorder   Non psychiatric diagnosis:  Hypertension  Glaucoma  DM     Total Time spent with patient: 25 minutes   Past Medical History  Diagnosis Date  . Hypertension   . Bell's palsy   . Glaucoma   . Diabetes mellitus without complication    ADL's:  Intact  Sleep: poor  Appetite:  Fair  Suicidal Ideation: denies Plan:  denies Homicidal Ideation:  denies AEB (as evidenced by):  Psychiatric Specialty Exam: Physical Exam  Psychiatric: Judgment normal. Her mood appears anxious. Her speech is rapid and/or pressured. Thought content is paranoid and delusional. Cognition and memory are normal. She exhibits a depressed mood. Suicidal: passive.    Review of Systems  Constitutional: Negative.   HENT: Negative.   Eyes: Negative.   Respiratory: Negative.   Cardiovascular: Negative.   Gastrointestinal: Negative.   Genitourinary: Negative.   Musculoskeletal: Negative.   Skin: Negative.   Neurological: Negative.    Endo/Heme/Allergies: Negative.   Psychiatric/Behavioral: Positive for depression. The patient is nervous/anxious.     Blood pressure 120/51, pulse 85, temperature 99.7 F (37.6 C), temperature source Oral, resp. rate 18, height _0  (1.626 m), weight 86.637 kg (191 lb).Body mass index is 32.77 kg/(m^2).  General Appearance: Casual  Eye Contact::  Good  Speech:  Clear and Coherent  Volume:  Decreased  Mood:  Anxious improving  Affect:  Congruent  Thought Process:  Circumstantial and Disorganized improving  Orientation:  Full (Time, Place, and Person)  Thought Content:  Delusions  Suicidal Thoughts:  No   Homicidal Thoughts:  No  Memory:  Immediate;   Fair Recent;   Fair Remote;   Fair  Judgement:  Impaired  Insight:  Lacking  Psychomotor Activity:  Decreased  Concentration:  Fair  Recall:  [employer] of Knowledge:Good  Language: Good  Akathisia:  No  Handed:  Right  AIMS (if indicated):     Assets:  Communication Skills Desire for Improvement Physical Health  Sleep:  Number of Hours: 6.5   Musculoskeletal: Strength & Muscle Tone: within normal limits Gait & Station: normal Patient leans: N/A  Current Medications: Current Facility-Administered Medications  Medication Dose Route Frequency Provider Last Rate Last Dose  . acetaminophen (TYLENOL) tablet 650 mg  650 mg Oral Q6H PRN Shuvon Rankin, NP      . albuterol (PROVENTIL HFA;VENTOLIN HFA) 108 (90 BASE) MCG/ACT inhaler 2 puff  2 puff Inhalation Q6H PRN Shuvon Rankin, NP      . alum & mag hydroxide-simeth (MAALOX/MYLANTA)  200-200-20 MG/5ML suspension 30 mL  30 mL Oral Q4H PRN Shuvon Rankin, NP      . ARIPiprazole (ABILIFY) tablet 15 mg  15 mg Oral QHS Ursula Alert, MD   15 mg at 08/21/14 2232  . benztropine (COGENTIN) tablet 0.5 mg  0.5 mg Oral QHS Mojeed Akintayo   0.5 mg at 08/21/14 2232  . donepezil (ARICEPT) tablet 5 mg  5 mg Oral QHS Wayburn Shaler, MD      . glimepiride (AMARYL) tablet 4 mg  4 mg Oral Q  breakfast Shuvon Rankin, NP   4 mg at 08/22/14 0815  . lamoTRIgine (LAMICTAL) tablet 50 mg  50 mg Oral QPM Kathryne Ramella, MD      . latanoprost (XALATAN) 0.005 % ophthalmic solution 1 drop  1 drop Both Eyes QHS Shuvon Rankin, NP   1 drop at 08/21/14 2231  . lisinopril (PRINIVIL,ZESTRIL) tablet 40 mg  40 mg Oral Daily Shuvon Rankin, NP   40 mg at 08/21/14 1428  . magnesium hydroxide (MILK OF MAGNESIA) suspension 30 mL  30 mL Oral Daily PRN Shuvon Rankin, NP      . menthol-cetylpyridinium (CEPACOL) lozenge 3 mg  1 lozenge Oral QID Ursula Alert, MD   3 mg at 08/21/14 2000  . metFORMIN (GLUCOPHAGE) tablet 1,000 mg  1,000 mg Oral BID WC Shuvon Rankin, NP   1,000 mg at 08/22/14 0815  . multivitamin with minerals tablet 1 tablet  1 tablet Oral Daily Shuvon Rankin, NP   1 tablet at 08/22/14 0815  . ondansetron (ZOFRAN-ODT) disintegrating tablet 4 mg  4 mg Oral Q8H PRN Benjamine Mola, FNP   4 mg at 08/20/14 1915  . pantoprazole (PROTONIX) EC tablet 20 mg  20 mg Oral BID AC Tzivia Oneil, MD   20 mg at 08/22/14 0815  . simvastatin (ZOCOR) tablet 40 mg  40 mg Oral Daily Shuvon Rankin, NP   40 mg at 08/22/14 0815  . traZODone (DESYREL) tablet 100 mg  100 mg Oral QHS Ursula Alert, MD   100 mg at 08/21/14 2232    Lab Results:  Results for orders placed during the hospital encounter of 08/16/14 (from the past 48 hour(s))  GLUCOSE, CAPILLARY     Status: Abnormal   Collection Time    08/20/14 11:54 AM      Result Value Ref Range   Glucose-Capillary 61 (*) 70 - 99 mg/dL  GLUCOSE, CAPILLARY     Status: Abnormal   Collection Time    08/20/14  4:59 PM      Result Value Ref Range   Glucose-Capillary 125 (*) 70 - 99 mg/dL  GLUCOSE, CAPILLARY     Status: Abnormal   Collection Time    08/20/14  9:04 PM      Result Value Ref Range   Glucose-Capillary 105 (*) 70 - 99 mg/dL   Comment 1 Notify RN    GLUCOSE, CAPILLARY     Status: Abnormal   Collection Time    08/21/14  6:07 AM      Result Value Ref Range    Glucose-Capillary 140 (*) 70 - 99 mg/dL  GLUCOSE, CAPILLARY     Status: Abnormal   Collection Time    08/21/14 10:58 AM      Result Value Ref Range   Glucose-Capillary 119 (*) 70 - 99 mg/dL   Comment 1 Documented in Chart     Comment 2 Notify RN    GLUCOSE, CAPILLARY     Status: Abnormal  Collection Time    08/21/14  4:58 PM      Result Value Ref Range   Glucose-Capillary 146 (*) 70 - 99 mg/dL  GLUCOSE, CAPILLARY     Status: Abnormal   Collection Time    08/21/14  9:38 PM      Result Value Ref Range   Glucose-Capillary 128 (*) 70 - 99 mg/dL  GLUCOSE, CAPILLARY     Status: Abnormal   Collection Time    08/22/14  6:11 AM      Result Value Ref Range   Glucose-Capillary 119 (*) 70 - 99 mg/dL    Physical Findings: AIMS: Facial and Oral Movements Muscles of Facial Expression: None, normal Lips and Perioral Area: None, normal Jaw: None, normal Tongue: None, normal,Extremity Movements Upper (arms, wrists, hands, fingers): None, normal Lower (legs, knees, ankles, toes): None, normal, Trunk Movements Neck, shoulders, hips: None, normal, Overall Severity Severity of abnormal movements (highest score from questions above): None, normal Incapacitation due to abnormal movements: None, normal Patient's awareness of abnormal movements (rate only patient's report): No Awareness, Dental Status Current problems with teeth and/or dentures?: Yes (no teeth) Does patient usually wear dentures?: Yes (don't have with her)  CIWA:  CIWA-Ar Total: 0 COWS:  COWS Total Score: 1  Treatment Plan Summary: Daily contact with patient to assess and evaluate symptoms and progress in treatment Medication management  Plan: 1.Continue  crisis management and stabilization. 2. Medication management to reduce current symptoms to base line and improve the patient's overall level of functioning: -Will continue Abilify 7m po qhs mood/psychosis -Will DC trazodone. Will start a trial of Remeron at a low dose  of 7.5 mg at bedtime. -Will increase Lamictal to 561mpo daily for mood stabilization changed to QPM -2/2 sedation in the AM. -Cogentin 0.74m41mo qhs for EPS prevention -Will also start a trial of Aricept for memory issues ,patient seems to have mild neurocognitive issues ,has problems at home taking care of the house as well as cooking and organizing things.Will start a trial of Aricept 5 mg po qhs. 3. Treat health problems as indicated.Will hold Lisinopril -until her BP is better. Will continue to monitor. 4. Develop treatment plan to decrease risk of relapse upon discharge and the need for     readmission. 5. Psycho-social education regarding relapse prevention and self care. 6. Health care follow up as needed for medical problems. 7. Restart home medications where appropriate.   Medical Decision Making Problem Points:  Established problem, worsening (2), Review of last therapy session (1) and Review of psycho-social stressors (1) Data Points:  Order Aims Assessment (2) Review of medication regiment & side effects (2)  I certify that inpatient services furnished can reasonably be expected to improve the patient's condition.   Tamaria Dunleavy,MD 08/22/2014, 11:31 AM

## 2014-08-22 NOTE — Progress Notes (Signed)
Patient ID: Elizabeth Thompson, female   DOB: 1951-07-28, 63 y.o.   MRN: [id number]  D: Pt appeared to be sad and depressed more today than previous day. When asked about her day she held her head down and shook it "no".  Then pt stated, "I don't have none of my medicines; my bp pills. Writer informed pt that doctors will normally give a sample of meds they want the pts to continue taking. However, writer did not see any bp meds ordered for the pt.  A:  Support and encouragement was offered. 15 min checks continued for safety.  R: Pt remains safe.

## 2014-08-22 NOTE — BHH Group Notes (Signed)
[place] LCSW Group Therapy  08/22/2014 2:16 PM  Type of Therapy:  Group Therapy  Participation Level:  Minimal  Participation Quality:  Drowsy  Affect:  Flat  Cognitive:  Lacking  Insight:  Limited  Engagement in Therapy:  Limited  Modes of Intervention:  Discussion, Education, Exploration, Problem-solving, Socialization and Support  Summary of Progress/Problems: [facility] Speaker came to talk about his personal journey with substance abuse and addiction. The pt processed ways by which to relate to the speaker. [place] speaker provided handouts and educational information pertaining to groups and services offered by the [facility]. Elizabeth Thompson stated that she was having difficulty concentrating during group but made and effort to remain attentive and engaged. She stated that she enjoyed the guitar when the presenter played some songs. She appears to be making some progress in the group setting.   Smart, Elizabeth Thompson LCSWA  08/22/2014, 2:16 PM

## 2014-08-22 NOTE — BHH Group Notes (Signed)
[facility] LCSW Aftercare Discharge Planning Group Note   08/22/2014 12:21 PM  Participation Quality:  Minimal  Mood/Affect:  Flat  Depression Rating:  unk  Anxiety Rating:  unk  Thoughts of Suicide:  No Will you contract for safety?   NA  Current AVH:  No  Plan for Discharge/Comments:  Loyalty presents with flat, depressed affect and minimal engagement in conversation.  States she is not sleeping well "because I woke up once last night" and "I am doing the best I can today."  No elaboration.  Transportation Means: neice  Supports: neice  [place], [place] B

## 2014-08-22 NOTE — Clinical Social Work Note (Signed)
CSW spoke with pt's niece, who is "very concerned" about pt's tentative d/c of Thursday or Friday. [name] stated that she spoke with [place] today and thinks that Arriyah is "disoriented and unclear." Her niece then asked for 400 hall number and would like to speak with Dr. Shea Evans. CSW provided her with this info and assured her that her concerns would be brought to Dr. Shea Evans during tomorrow's tx team. CSW spoke with pt individually to check in. Pt stated that she is feeling "cloudy" and "not able to focus on anything." Pt was holding her head in her hands and tearful. CSW provided encouragement. Pt mood gradually changed and she talked about enjoying the guitar playing from group.   [place], [place] 08/22/2014 2:31 PM

## 2014-08-23 LAB — BASIC METABOLIC PANEL
Anion gap: 16 — ABNORMAL HIGH (ref 5–15)
BUN: 11 mg/dL (ref 6–23)
CO2: 25 meq/L (ref 19–32)
Calcium: 11.2 mg/dL — ABNORMAL HIGH (ref 8.4–10.5)
Chloride: 90 mEq/L — ABNORMAL LOW (ref 96–112)
Creatinine, Ser: 0.8 mg/dL (ref 0.50–1.10)
GFR calc Af Amer: 88 mL/min — ABNORMAL LOW (ref >90)
GFR, EST NON AFRICAN AMERICAN: 76 mL/min — AB (ref >90)
GLUCOSE: 176 mg/dL — AB (ref 70–99)
POTASSIUM: 4.9 meq/L (ref 3.7–5.3)
SODIUM: 131 meq/L — AB (ref 137–147)

## 2014-08-23 LAB — GLUCOSE, CAPILLARY
GLUCOSE-CAPILLARY: 94 mg/dL (ref 70–99)
GLUCOSE-CAPILLARY: 168 mg/dL — AB (ref 70–99)
Glucose-Capillary: 140 mg/dL — ABNORMAL HIGH (ref 70–99)
Glucose-Capillary: 248 mg/dL — ABNORMAL HIGH (ref 70–99)

## 2014-08-23 MED ORDER — LAMOTRIGINE 100 MG PO TABS
100.0000 mg | ORAL_TABLET | Freq: Every evening | ORAL | Status: DC
  Administered 2014-08-23 – 2014-08-26 (×4): 100 mg via ORAL
  Filled 2014-08-23 (×3): qty 1
  Filled 2014-08-23: qty 4
  Filled 2014-08-23 (×2): qty 1

## 2014-08-23 NOTE — Progress Notes (Signed)
Patient ID: Elizabeth Thompson, female   DOB: [birth year], 63 y.o.   MRN: [id number] [facility] MD Progress Note  08/23/2014 10:55 AM LILYANN GRAVELLE  MRN:  [id number] Subjective: "Patient reports she is not ready to be discharged "  Objective: Patient was seen and her chart was reviewed. Patient is calm and cooperative . She appears to be slow ,depressed with psychomotor retardation . Patient also may probably have early dementia since she has been having memory problems .  Patient denies any AH/VH today and denies SI/HI . She denies any side effects of medications.    Per staff patient has been compliant on medications.Patient continues to be depressed . No outbursts reported.  Spoke to niece [name] - who reports she is concerned about patient and that she does not feel patient is ready to be discharged . Per niece she needs to be safe to be discharged ,which she feels she is not at this time .   Diagnosis:   DSM5: Primary psychiatric diagnosis:  Schizoaffective disorder,depressed type ,multiple episodes ,currently in acute episode   Secondary psychiatric diagnosis:  Mild Neurocognitive disorder Tobacco use disorder   Non psychiatric diagnosis:  Hypertension  Glaucoma  DM     Total Time spent with patient: 25 minutes   Past Medical History  Diagnosis Date  . Hypertension   . Bell's palsy   . Glaucoma   . Diabetes mellitus without complication    ADL's:  Intact  Sleep: poor  Appetite:  Fair  Suicidal Ideation: denies Plan:  denies Homicidal Ideation:  denies AEB (as evidenced by):  Psychiatric Specialty Exam: Physical Exam  Psychiatric: Judgment normal. Her mood appears anxious. Her affect is labile. Her speech is delayed. She is withdrawn. Thought content is not paranoid and not delusional. Cognition and memory are normal. She exhibits a depressed mood. She expresses no suicidal (passive) ideation.    Review of Systems  Constitutional: Negative.   HENT: Negative.    Eyes: Negative.   Respiratory: Negative.   Cardiovascular: Negative.   Gastrointestinal: Negative.   Genitourinary: Negative.   Musculoskeletal: Negative.   Skin: Negative.   Neurological: Negative.   Endo/Heme/Allergies: Negative.   Psychiatric/Behavioral: Positive for depression. The patient is nervous/anxious.     Blood pressure 143/90, pulse 89, temperature 98.8 F (37.1 C), temperature source Oral, resp. rate 17, height _0  (1.626 m), weight 86.637 kg (191 lb).Body mass index is 32.77 kg/(m^2).  General Appearance: Casual  Eye Contact::  Good  Speech:  Clear and Coherent  Volume:  Decreased  Mood:  Anxious improving  Affect:  Congruent  Thought Process:  Circumstantial and Disorganized improving  Orientation:  Full (Time, Place, and Person)  Thought Content:  Delusions  Suicidal Thoughts:  No   Homicidal Thoughts:  No  Memory:  Immediate;   Fair Recent;   Fair Remote;   Fair  Judgement:  Impaired  Insight:  Lacking  Psychomotor Activity:  Decreased  Concentration:  Fair  Recall:  [employer] of Knowledge:Good  Language: Good  Akathisia:  No  Handed:  Right  AIMS (if indicated):     Assets:  Communication Skills Desire for Improvement Physical Health  Sleep:  Number of Hours: 5   Musculoskeletal: Strength & Muscle Tone: within normal limits Gait & Station: normal Patient leans: N/A  Current Medications: Current Facility-Administered Medications  Medication Dose Route Frequency Provider Last Rate Last Dose  . acetaminophen (TYLENOL) tablet 650 mg  650 mg Oral Q6H PRN Shuvon  Rankin, NP      . albuterol (PROVENTIL HFA;VENTOLIN HFA) 108 (90 BASE) MCG/ACT inhaler 2 puff  2 puff Inhalation Q6H PRN Shuvon Rankin, NP      . alum & mag hydroxide-simeth (MAALOX/MYLANTA) 200-200-20 MG/5ML suspension 30 mL  30 mL Oral Q4H PRN Shuvon Rankin, NP      . ARIPiprazole (ABILIFY) tablet 15 mg  15 mg Oral QHS Ursula Alert, MD   15 mg at 08/22/14 2143  . benztropine  (COGENTIN) tablet 0.5 mg  0.5 mg Oral QHS Mojeed Akintayo   0.5 mg at 08/22/14 2143  . donepezil (ARICEPT) tablet 5 mg  5 mg Oral QHS Ursula Alert, MD   5 mg at 08/22/14 2143  . feeding supplement (GLUCERNA SHAKE) (GLUCERNA SHAKE) liquid 237 mL  237 mL Oral TID BM Jesselee Poth, MD   237 mL at 08/23/14 0935  . glimepiride (AMARYL) tablet 4 mg  4 mg Oral Q breakfast Shuvon Rankin, NP   4 mg at 08/23/14 0815  . lamoTRIgine (LAMICTAL) tablet 50 mg  50 mg Oral QPM Fatima Fedie, MD   50 mg at 08/22/14 1715  . latanoprost (XALATAN) 0.005 % ophthalmic solution 1 drop  1 drop Both Eyes QHS Shuvon Rankin, NP   1 drop at 08/22/14 2145  . magnesium hydroxide (MILK OF MAGNESIA) suspension 30 mL  30 mL Oral Daily PRN Shuvon Rankin, NP      . menthol-cetylpyridinium (CEPACOL) lozenge 3 mg  1 lozenge Oral PRN Ursula Alert, MD      . metFORMIN (GLUCOPHAGE) tablet 1,000 mg  1,000 mg Oral BID WC Shuvon Rankin, NP   1,000 mg at 08/23/14 0815  . mirtazapine (REMERON) tablet 7.5 mg  7.5 mg Oral QHS Kynsie Falkner, MD   7.5 mg at 08/22/14 2143  . multivitamin with minerals tablet 1 tablet  1 tablet Oral Daily Shuvon Rankin, NP   1 tablet at 08/23/14 0815  . ondansetron (ZOFRAN-ODT) disintegrating tablet 4 mg  4 mg Oral Q8H PRN Benjamine Mola, FNP   4 mg at 08/20/14 1915  . pantoprazole (PROTONIX) EC tablet 20 mg  20 mg Oral BID AC Ursula Alert, MD   20 mg at 08/23/14 0641  . simvastatin (ZOCOR) tablet 40 mg  40 mg Oral Daily Shuvon Rankin, NP   40 mg at 08/23/14 4098    Lab Results:  Results for orders placed during the hospital encounter of 08/16/14 (from the past 48 hour(s))  GLUCOSE, CAPILLARY     Status: Abnormal   Collection Time    08/21/14 10:58 AM      Result Value Ref Range   Glucose-Capillary 119 (*) 70 - 99 mg/dL   Comment 1 Documented in Chart     Comment 2 Notify RN    GLUCOSE, CAPILLARY     Status: Abnormal   Collection Time    08/21/14  4:58 PM      Result Value Ref Range    Glucose-Capillary 146 (*) 70 - 99 mg/dL  GLUCOSE, CAPILLARY     Status: Abnormal   Collection Time    08/21/14  9:38 PM      Result Value Ref Range   Glucose-Capillary 128 (*) 70 - 99 mg/dL  GLUCOSE, CAPILLARY     Status: Abnormal   Collection Time    08/22/14  6:11 AM      Result Value Ref Range   Glucose-Capillary 119 (*) 70 - 99 mg/dL  GLUCOSE, CAPILLARY     Status:  Abnormal   Collection Time    08/22/14 12:05 PM      Result Value Ref Range   Glucose-Capillary 67 (*) 70 - 99 mg/dL  GLUCOSE, CAPILLARY     Status: None   Collection Time    08/22/14 12:16 PM      Result Value Ref Range   Glucose-Capillary 77  70 - 99 mg/dL   Comment 1 Notify RN    GLUCOSE, CAPILLARY     Status: Abnormal   Collection Time    08/22/14  5:13 PM      Result Value Ref Range   Glucose-Capillary 143 (*) 70 - 99 mg/dL  GLUCOSE, CAPILLARY     Status: Abnormal   Collection Time    08/22/14  8:52 PM      Result Value Ref Range   Glucose-Capillary 110 (*) 70 - 99 mg/dL  GLUCOSE, CAPILLARY     Status: Abnormal   Collection Time    08/23/14  6:17 AM      Result Value Ref Range   Glucose-Capillary 140 (*) 70 - 99 mg/dL    Physical Findings: AIMS: Facial and Oral Movements Muscles of Facial Expression: None, normal Lips and Perioral Area: None, normal Jaw: None, normal Tongue: None, normal,Extremity Movements Upper (arms, wrists, hands, fingers): None, normal Lower (legs, knees, ankles, toes): None, normal, Trunk Movements Neck, shoulders, hips: None, normal, Overall Severity Severity of abnormal movements (highest score from questions above): None, normal Incapacitation due to abnormal movements: None, normal Patient's awareness of abnormal movements (rate only patient's report): No Awareness, Dental Status Current problems with teeth and/or dentures?: Yes (no teeth) Does patient usually wear dentures?: Yes (don't have with her)  CIWA:  CIWA-Ar Total: 0 COWS:  COWS Total Score: 1  Treatment  Plan Summary: Daily contact with patient to assess and evaluate symptoms and progress in treatment Medication management  Plan: 1.Continue  crisis management and stabilization. 2. Medication management to reduce current symptoms to base line and improve the patient's overall level of functioning: -Will continue Abilify 15m po qhs mood/psychosis - Will continue Remeron at a low dose of 7.5 mg at bedtime. -Will increase Lamictal 100 mg po daily for mood stabilization changed to QPM -2/2 sedation in the AM. -Cogentin 0.565mpo qhs for EPS prevention -Will also start a trial of Aricept for memory issues ,patient seems to have mild neurocognitive issues ,has problems at home taking care of the house as well as cooking and organizing things.Will continue trial of Aricept 5 mg po qhs. 3.Will DC Lisinopril.Patient to be referred to PCP on discharge for assessing the need for an antihypertensive in the future. 4. Develop treatment plan to decrease risk of relapse upon discharge and the need for     readmission. 5. Psycho-social education regarding relapse prevention and self care. 6. Health care follow up as needed for medical problems. 7. Restart home medications where appropriate.   Medical Decision Making Problem Points:  Established problem, worsening (2), Review of last therapy session (1) and Review of psycho-social stressors (1) Data Points:  Order Aims Assessment (2) Review of medication regiment & side effects (2)  I certify that inpatient services furnished can reasonably be expected to improve the patient's condition.   Mahathi Pokorney,MD 08/23/2014, 10:55 AM

## 2014-08-23 NOTE — [facility] Group Notes (Signed)
[facility] Group Notes:  (Nursing/MHT/Case Management/Adjunct)  Date:  08/23/2014  Time:  10:21 AM  Type of Therapy:  Nurse Education  Participation Level:  Minimal  Participation Quality:  Inattentive  Affect:  Anxious and Flat  Cognitive:  Confused  Insight:  Lacking  Engagement in Group:  Limited  Modes of Intervention:  Discussion and Education  Summary of Progress/Problems: Writer spoke to the theme them of the day, which is leisure and lifestyle activity. Focusing primarily on medication compliance, nutrition, and better ways for patients to take better care of themselves. Patient is seen in group and appears to be inattentive. Writer tried to engage patient by making eye contact when speaking but patient avoided.  Yostin Malacara E 08/23/2014, 10:21 AM

## 2014-08-23 NOTE — [facility] Group Notes (Signed)
[facility] LCSW Group Therapy  08/23/2014 3:00 PM   Type of Therapy:  Group Therapy  Participation Level:  Active  Participation Quality:  Attentive  Affect:  Appropriate  Cognitive:  Appropriate  Insight:  Improving  Engagement in Therapy:  Engaged  Modes of Intervention:  Clarification, Education, Exploration and Socialization  Summary of Progress/Problems: Today's group focused on relapse prevention.  We defined the term, and then brainstormed on ways to prevent relapse.  Elizabeth Thompson was attentive throughout, but participation was minimal.  Did not contribute spontaneously.  She was unsure of the meaning of relapse.  When asked about the hardest thing about being in the hospital, she replied "facing my own illness" which got a positive response from other group members.  When asked to clarify, was unable to be more specific.  Roque Lias B 08/23/2014 , 3:00 PM

## 2014-08-23 NOTE — Clinical Social Work Note (Signed)
Per Dr. Charlcie Cradle conversation with pt's primary support, Elizabeth Thompson (neice), Elizabeth Thompson is requesting assistance with getting pt into group home/ALF if pt cognitive and mental health issues do not improve or worsen.This was explained to Elizabeth Thompson by CSW and she agreed to sign release allowing PASRR referral to be made. She is aware that she will be returning home at d/c and following up at [facility]. Elizabeth Thompson stated that she will seek placement for pt only if her memory problems do no subside or if she is unable to care for Elizabeth Thompson in the future. Elizabeth Thompson verbalized understanding of this. CSW completed PASRR request and FL2-submitted at 3:15PM on 08/23/14.  [place], [place] 08/23/2014 3:34 PM

## 2014-08-23 NOTE — Progress Notes (Signed)
Patient ID: Elizabeth Thompson, female   DOB: September 30, 1951, 63 y.o.   MRN: [id number]  D: Pt. Denies SI/HI and A/V Hallucinations. Patient does not report any pain or discomfort at this time. Patient continues to have memory issues, patient states she is having trouble remembering some things like when she is discharging. Patient is oriented X 4. Patient did not turn in her daily inventory sheet today although prompted. This morning patient's niece called and spoke with Probation officer about patient's discharge. Writer listened to the niece's concerns and offered reassurance.   A: Support and encouragement provided to the patient. Scheduled medications administered to patient per physician's orders.   R: Patient is receptive and cooperative but minimal and forwards very little. Patient is seen in the milieu and is attending groups. Q15 minute checks are maintained for safety.

## 2014-08-24 LAB — GLUCOSE, CAPILLARY
Glucose-Capillary: 193 mg/dL — ABNORMAL HIGH (ref 70–99)
Glucose-Capillary: 141 mg/dL — ABNORMAL HIGH (ref 70–99)
Glucose-Capillary: 229 mg/dL — ABNORMAL HIGH (ref 70–99)

## 2014-08-24 MED ORDER — ARIPIPRAZOLE 10 MG PO TABS
20.0000 mg | ORAL_TABLET | Freq: Every day | ORAL | Status: DC
  Administered 2014-08-24 – 2014-08-26 (×3): 20 mg via ORAL
  Filled 2014-08-24: qty 2
  Filled 2014-08-24: qty 8
  Filled 2014-08-24 (×3): qty 2

## 2014-08-24 MED ORDER — HYDROXYZINE HCL 25 MG PO TABS
25.0000 mg | ORAL_TABLET | Freq: Four times a day (QID) | ORAL | Status: DC | PRN
  Administered 2014-08-24 – 2014-08-27 (×2): 25 mg via ORAL
  Filled 2014-08-24 (×2): qty 1

## 2014-08-24 NOTE — [facility] Group Notes (Signed)
[facility] LCSW Aftercare Discharge Planning Group Note   08/24/2014 10:09 AM  Participation Quality:  Invited. Did not attend   [place],Candace

## 2014-08-24 NOTE — Progress Notes (Addendum)
D: Pt was isolated to her room this evening. Pt was alert and oriented X4. Pt was blunted in affect and depressed in mood. Pt was worried about her memory. Pt informed about her prescribed Aricept. Pt is currently denying any SI/HI/AVH. Pt is also denying any physical pain   A: Writer administered scheduled  medications to pt. Continued support and availability as needed was extended to this pt. Staff continue to monitor pt with q61mn checks.  R: No adverse drug reactions noted. Pt receptive to treatment. Pt remains safe at this time.

## 2014-08-24 NOTE — Progress Notes (Signed)
Pt refused HS CBG

## 2014-08-24 NOTE — Progress Notes (Signed)
Patient ID: Elizabeth Thompson, female   DOB: [birth year], 63 y.o.   MRN: [id number] D. Patient presents with anxious mood, affect congruent. She states '' I feel anxious. I need something for my nerves '' Patient denies any AH/VH . She denies any SI/HI. She remains disheveled on the unit, but attending unit programming. A. Medications given as ordered. Support and encouragement provided. Discussed patient progress with Dr. Shea Evans. R. Patient is safe. No further voiced concerns. Will continue to monitor q 15 minutes for safety.

## 2014-08-24 NOTE — Progress Notes (Signed)
Patient ID: Elizabeth Thompson, female   DOB: 12-14-1951, 63 y.o.   MRN: [id number] [facility] MD Progress Note  08/24/2014 12:19 PM GUY TONEY  MRN:  [id number] Subjective: "Patient reports she is anxious to go back home to her niece"  Objective: Patient was seen and her chart was reviewed. Patient appeared to be irritable today, not wanting to talk initially. But later on second attempt patient started talking ,but continued to be irritable and agitated when she was asked questions as part of her evaluation. Patient reports anxiety and reports that it is triggered by her thoughts about going home. She reports she has thoughts about hurting her niece and does not want to go back home. She also reports that she has no other place to stay and that her daughter cannot take her in. She denies any AH,VH.SI today. She reports sleep as good. She had initially reported trouble with room mate ,but later on reported that she was talking about her previous room mate and not this one. (off note - patient had told Probation officer as well nursing staff yesterday that she has problems with her current room mate -but seems to change her story today. )   A SLUMS examination was done on her today (08/24/14) - She scored 22/30. So it is likely that she has Mild neurocognitive disorder, which could be 2/2 to her psychiatric diagnosis,age ,polypharmacy and her medical problems. She is able to take care of her ADLs as well as take care of self, but unable to assess at this time if she could do complicated activities like cooking ,driving ,finance managing and so on. Could refer her for neuropsychological testing on an outpatient basis. She is otherwise alert ,oriented x 3 (to person ,place and time ) - not confused .    Diagnosis:   DSM5: Primary psychiatric diagnosis:  Schizoaffective disorder,depressed type ,multiple episodes ,currently in acute episode   Secondary psychiatric diagnosis:  Mild Neurocognitive disorder Tobacco  use disorder   Non psychiatric diagnosis:  Hypertension  Glaucoma  DM     Total Time spent with patient: 30 minutes   Past Medical History  Diagnosis Date  . Hypertension   . Bell's palsy   . Glaucoma   . Diabetes mellitus without complication    ADL's:  Intact  Sleep: poor  Appetite:  Fair  Suicidal Ideation: denies Plan:  denies Homicidal Ideation:  Yes has thoughts about hurting her niece.    AEB (as evidenced by):  Psychiatric Specialty Exam: Physical Exam  Psychiatric: Judgment normal. Her mood appears anxious. Her affect is labile. Her speech is delayed. She is aggressive. Thought content is paranoid. Cognition and memory are impaired. She exhibits a depressed mood. She expresses homicidal ideation. She expresses no suicidal ideation.    Review of Systems  Constitutional: Negative.   HENT: Negative.   Eyes: Negative.   Respiratory: Negative.   Cardiovascular: Negative.   Gastrointestinal: Negative.   Genitourinary: Negative.   Musculoskeletal: Negative.   Skin: Negative.   Neurological: Negative.   Endo/Heme/Allergies: Negative.   Psychiatric/Behavioral: Positive for depression. The patient is nervous/anxious.     Blood pressure 141/72, pulse 93, temperature 98.1 F (36.7 C), temperature source Oral, resp. rate 20, height _0  (1.626 m), weight 86.637 kg (191 lb).Body mass index is 32.77 kg/(m^2).  General Appearance: Casual  Eye Contact::  Good  Speech:  Clear and Coherent  Volume:  Normal  Mood:  Anxious has periods of irritability  Affect:  Congruent  Thought Process:  Circumstantial and Disorganized improving  Orientation:  Full (Time, Place, and Person)  Thought Content:  Delusions  Suicidal Thoughts:  No   Homicidal Thoughts:  Yes.  without intent/plan  Memory:  Immediate;   Fair Recent;   Fair Remote;   Fair  Judgement:  Impaired  Insight:  Lacking  Psychomotor Activity:  Decreased  Concentration:  Fair  Recall:  [employer] of  Knowledge:Good  Language: Good  Akathisia:  No  Handed:  Right  AIMS (if indicated):     Assets:  Communication Skills Desire for Improvement Physical Health  Sleep:  Number of Hours: 5   Musculoskeletal: Strength & Muscle Tone: within normal limits Gait & Station: normal Patient leans: N/A  Current Medications: Current Facility-Administered Medications  Medication Dose Route Frequency Provider Last Rate Last Dose  . acetaminophen (TYLENOL) tablet 650 mg  650 mg Oral Q6H PRN Shuvon Rankin, NP      . albuterol (PROVENTIL HFA;VENTOLIN HFA) 108 (90 BASE) MCG/ACT inhaler 2 puff  2 puff Inhalation Q6H PRN Shuvon Rankin, NP      . alum & mag hydroxide-simeth (MAALOX/MYLANTA) 200-200-20 MG/5ML suspension 30 mL  30 mL Oral Q4H PRN Shuvon Rankin, NP      . ARIPiprazole (ABILIFY) tablet 20 mg  20 mg Oral QHS Banks Chaikin, MD      . benztropine (COGENTIN) tablet 0.5 mg  0.5 mg Oral QHS Mojeed Akintayo   0.5 mg at 08/23/14 2140  . donepezil (ARICEPT) tablet 5 mg  5 mg Oral QHS Ursula Alert, MD   5 mg at 08/23/14 2140  . feeding supplement (GLUCERNA SHAKE) (GLUCERNA SHAKE) liquid 237 mL  237 mL Oral TID BM Lucianne Smestad, MD   237 mL at 08/24/14 0933  . glimepiride (AMARYL) tablet 4 mg  4 mg Oral Q breakfast Shuvon Rankin, NP   4 mg at 08/24/14 0746  . hydrOXYzine (ATARAX/VISTARIL) tablet 25 mg  25 mg Oral Q6H PRN Ursula Alert, MD   25 mg at 08/24/14 0913  . lamoTRIgine (LAMICTAL) tablet 100 mg  100 mg Oral QPM Ursula Alert, MD   100 mg at 08/23/14 1716  . latanoprost (XALATAN) 0.005 % ophthalmic solution 1 drop  1 drop Both Eyes QHS Shuvon Rankin, NP   1 drop at 08/23/14 2140  . magnesium hydroxide (MILK OF MAGNESIA) suspension 30 mL  30 mL Oral Daily PRN Shuvon Rankin, NP      . menthol-cetylpyridinium (CEPACOL) lozenge 3 mg  1 lozenge Oral PRN Ursula Alert, MD      . metFORMIN (GLUCOPHAGE) tablet 1,000 mg  1,000 mg Oral BID WC Shuvon Rankin, NP   1,000 mg at 08/24/14 0746  .  mirtazapine (REMERON) tablet 7.5 mg  7.5 mg Oral QHS Sofhia Ulibarri, MD   7.5 mg at 08/23/14 2140  . multivitamin with minerals tablet 1 tablet  1 tablet Oral Daily Shuvon Rankin, NP   1 tablet at 08/24/14 0746  . ondansetron (ZOFRAN-ODT) disintegrating tablet 4 mg  4 mg Oral Q8H PRN Benjamine Mola, FNP   4 mg at 08/20/14 1915  . pantoprazole (PROTONIX) EC tablet 20 mg  20 mg Oral BID AC Freada Twersky, MD   20 mg at 08/24/14 0636  . simvastatin (ZOCOR) tablet 40 mg  40 mg Oral Daily Shuvon Rankin, NP   40 mg at 08/24/14 7412    Lab Results:  Results for orders placed during the hospital encounter of 08/16/14 (from the past 48 hour(s))  GLUCOSE, CAPILLARY     Status: Abnormal   Collection Time    08/22/14  5:13 PM      Result Value Ref Range   Glucose-Capillary 143 (*) 70 - 99 mg/dL  GLUCOSE, CAPILLARY     Status: Abnormal   Collection Time    08/22/14  8:52 PM      Result Value Ref Range   Glucose-Capillary 110 (*) 70 - 99 mg/dL  GLUCOSE, CAPILLARY     Status: Abnormal   Collection Time    08/23/14  6:17 AM      Result Value Ref Range   Glucose-Capillary 140 (*) 70 - 99 mg/dL  GLUCOSE, CAPILLARY     Status: None   Collection Time    08/23/14 11:38 AM      Result Value Ref Range   Glucose-Capillary 94  70 - 99 mg/dL  GLUCOSE, CAPILLARY     Status: Abnormal   Collection Time    08/23/14  5:04 PM      Result Value Ref Range   Glucose-Capillary 168 (*) 70 - 99 mg/dL   Comment 1 Documented in Chart     Comment 2 Notify RN    BASIC METABOLIC PANEL     Status: Abnormal   Collection Time    08/23/14  7:33 PM      Result Value Ref Range   Sodium 131 (*) 137 - 147 mEq/L   Potassium 4.9  3.7 - 5.3 mEq/L   Chloride 90 (*) 96 - 112 mEq/L   CO2 25  19 - 32 mEq/L   Glucose, Bld 176 (*) 70 - 99 mg/dL   BUN 11  6 - 23 mg/dL   Creatinine, Ser 0.80  0.50 - 1.10 mg/dL   Calcium 11.2 (*) 8.4 - 10.5 mg/dL   GFR calc non Af Amer 76 (*) >90 mL/min   GFR calc Af Amer 88 (*) >90 mL/min    Comment: (NOTE)     The eGFR has been calculated using the CKD EPI equation.     This calculation has not been validated in all clinical situations.     eGFR's persistently <90 mL/min signify possible Chronic Kidney     Disease.   Anion gap 16 (*) 5 - 15   Comment: Performed at [facility], CAPILLARY     Status: Abnormal   Collection Time    08/23/14  8:42 PM      Result Value Ref Range   Glucose-Capillary 248 (*) 70 - 99 mg/dL  GLUCOSE, CAPILLARY     Status: Abnormal   Collection Time    08/24/14  5:55 AM      Result Value Ref Range   Glucose-Capillary 193 (*) 70 - 99 mg/dL   Comment 1 Notify RN    GLUCOSE, CAPILLARY     Status: Abnormal   Collection Time    08/24/14 12:08 PM      Result Value Ref Range   Glucose-Capillary 141 (*) 70 - 99 mg/dL   Comment 1 Notify RN      Physical Findings: AIMS: Facial and Oral Movements Muscles of Facial Expression: None, normal Lips and Perioral Area: None, normal Jaw: None, normal Tongue: None, normal,Extremity Movements Upper (arms, wrists, hands, fingers): None, normal Lower (legs, knees, ankles, toes): None, normal, Trunk Movements Neck, shoulders, hips: None, normal, Overall Severity Severity of abnormal movements (highest score from questions above): None, normal Incapacitation due to abnormal movements: None, normal Patient's awareness of  abnormal movements (rate only patient's report): No Awareness, Dental Status Current problems with teeth and/or dentures?: Yes (no teeth) Does patient usually wear dentures?: Yes (don't have with her)  CIWA:  CIWA-Ar Total: 0 COWS:  COWS Total Score: 1  Treatment Plan Summary:  Daily contact with patient to assess and evaluate symptoms and progress in treatment Medication management  Plan: 1.Continue  crisis management and stabilization. 2. Medication management to reduce current symptoms to base line and improve the patient's overall level of functioning: -Will  increase Abilify to 24m po qhs mood/psychosis - Will continue Remeron at a low dose of 7.5 mg at bedtime for sleep.Could increase to 15 mg over the weekend if needed. -Will continue Lamictal 100 mg po daily for mood stabilization changed to QPM -2/2 sedation in the AM. -Cogentin 0.535mpo qhs for EPS prevention -Will also start a trial of Aricept for memory issues ,patient seems to have mild neurocognitive issues ,has problems at home taking care of the house as well as cooking and organizing things. A SLUMS examination was done on her today (08/24/14) - She scored 22/30. So it is likely that she has Mild neurocognitive disorder, which could be 2/2 to her psychiatric diagnosis,age ,polypharmacy and her medical problems. She is able to take care of her ADLs as well as take care of self, but unable to assess at this time if she could do complicated activities like cooking ,driving ,finance managing and so on. Could refer her for neuropsychological testing on an outpatient basis. She is otherwise alert ,oriented x 3 (to person ,place and time ) - not confused .  3.Will DC Lisinopril.Patient to be referred to PCP on discharge for assessing the need for an antihypertensive in the future. 4. Develop treatment plan to decrease risk of relapse upon discharge and the need for  readmission. 5. Psycho-social education regarding relapse prevention and self care. 6. Health care follow up as needed for medical problems.  Medical Decision Making Problem Points:  Established problem, stable/improving (1), Review of last therapy session (1) and Review of psycho-social stressors (1) Data Points:  Order Aims Assessment (2) Review of medication regiment & side effects (2)  I certify that inpatient services furnished can reasonably be expected to improve the patient's condition.   Syann Cupples,MD 08/24/2014, 12:19 PM

## 2014-08-24 NOTE — Progress Notes (Signed)
Psychoeducational Group Note  Date:  08/24/2014 Time:  2122  Group Topic/Focus:  Wrap-Up Group:   The focus of this group is to help patients review their daily goal of treatment and discuss progress on daily workbooks.  Participation Level: Did Not Attend  Participation Quality:  Not Applicable  Affect:  Not Applicable  Cognitive:  Not Applicable  Insight:  Not Applicable  Engagement in Group: Not Applicable  Additional Comments:  The patient did not attend group this evening.   Quana Chamberlain S 08/24/2014, 9:22 PM

## 2014-08-24 NOTE — Clinical Social Work Note (Addendum)
CSW made contact attempt with pt's niece, Olin Hauser to discuss PASRR request and talk about pt's tentative Monday d/c home. Not able to leave message. CSW will try again this afternoon.  [address], LCSWA 08/24/2014 10:37 AM   Olin Hauser called CSW. CSW told her above info. Olin Hauser requested that pt be transported directly to ALF. CSW explained process and how this would not be possible, as [facility] is short term crisis stabilization. Olin Hauser will call back with pt's social worker contact info (through [facility]) to inform her of above and ask for her continued assistance.  [place], [place] 08/24/2014 12:28 PM

## 2014-08-24 NOTE — [facility] Group Notes (Signed)
Joshua Tree LCSW Group Therapy  08/24/2014  1:05 PM  Type of Therapy:  Group therapy  Participation Level:  Active  Participation Quality:  Attentive  Affect:  Flat  Cognitive:  Oriented  Insight:  Limited  Engagement in Therapy:  Limited  Modes of Intervention:  Discussion, Socialization  Summary of Progress/Problems:  Chaplain was here to lead a group on theme of care. "Care is love."  Unable to ellaborate further other than to say that having thoughts of hurting or killing another person is not loving.  Sat quietly and appeared to be paying attention.  Roque Lias B 08/24/2014 1:38 PM

## 2014-08-24 NOTE — Tx Team (Signed)
  Interdisciplinary Treatment Plan Update   Date Reviewed:  08/24/2014  Time Reviewed: 9:43 AM  Progress in Treatment:   Attending groups: Yes Participating in groups: Yes Taking medication as prescribed: Yes  Tolerating medication: Yes Family/Significant other contact made: SPE completed with pt's niece.  Patient understands diagnosis: Yes AEB asking for help with SI secondary to depression Discussing patient identified problems/goals with staff: Yes  See initial care plan Medical problems stabilized or resolved: Yes Denies suicidal/homicidal ideation: Yes  In tx team Patient has not harmed self or others: Yes  For review of initial/current patient goals, please see plan of care.  Estimated Length of Stay:  3 days (tentaive d/c scheduled for Monday)  Reason for Continuation of Hospitalization: Depression/Mood stabilization Med management   New Problems/Goals identified:  N/A  Discharge Plan or Barriers:   return home with her niece, follow up outpt at [facility]. Pt wants to make her own appt with PCP, who she was seeing for mental health meds prior to admission. PT signed release allowing CSW to complete FL2 and PASRR request. (made 08/23/14). CSW spoke with niece who feels that pt mental health and cognitive/memory deterioration is making it difficult to care for her in the home.   Additional Comments:    Attendees:  Signature: Steva Colder, MD 08/24/2014 9:42 AM   Signature: Ripley Fraise, LCSW 08/24/2014 9:43 AM   Signature: Maxie Better, LCSWA   08/24/2014 9:43 AM   Signature: Marye Round RN  08/24/2014 9:43 AM   Signature: Mendel Ryder RN 08/24/2014 9:43 AM   Signature: York Spaniel, LCSW 08/24/2014 9:43 AM   Signature:  Bonnye Fava, CSW Intern 08/24/2014 9:43 AM   Signature:    Signature:    Signature:    Signature:    Signature:    Signature:      Scribe for Treatment Team:   Maxie Better, [place] 08/24/2014 9:42 AM

## 2014-08-25 DIAGNOSIS — F3289 Other specified depressive episodes: Secondary | ICD-10-CM

## 2014-08-25 DIAGNOSIS — F329 Major depressive disorder, single episode, unspecified: Secondary | ICD-10-CM

## 2014-08-25 LAB — GLUCOSE, CAPILLARY
GLUCOSE-CAPILLARY: 134 mg/dL — AB (ref 70–99)
GLUCOSE-CAPILLARY: 208 mg/dL — AB (ref 70–99)
Glucose-Capillary: 160 mg/dL — ABNORMAL HIGH (ref 70–99)
Glucose-Capillary: 159 mg/dL — ABNORMAL HIGH (ref 70–99)

## 2014-08-25 NOTE — Progress Notes (Signed)
Patient ID: Elizabeth Thompson, female   DOB: September 07, 1951, 63 y.o.   MRN: [id number] D. Patient presents with depressed mood, affect congruent . She states '' I'm fine. '' She was suspicious of her medications this morning. Patient denies any AH/VH . She denies any SI/HI. She remains disheveled on the unit, but attending unit programming, and cooperative with staff. A. Medications given as ordered. Support and encouragement provided. Discussed patient progress with Heloise Purpura NP. Patient is safe. No further voiced concerns. Will continue to monitor q 15 minutes for safety.

## 2014-08-25 NOTE — [facility] Group Notes (Signed)
[place] Group Notes:  (Nursing/MHT/Case Management/Adjunct)  Date:  08/25/2014  Time:  10:18 AM  Type of Therapy:  self inventory review  Participation Level:  Active  Participation Quality:  Drowsy  Affect:  Appropriate  Cognitive:  Disorganized  Insight:  Lacking  Engagement in Group:  Lacking and Limited  Modes of Intervention:  Discussion, Education and Exploration  Summary of Progress/Problems:  Leonia Reader 08/25/2014, 10:18 AM

## 2014-08-25 NOTE — [facility] Group Notes (Signed)
[place] Group Notes:  (Nursing/MHT/Case Management/Adjunct)  Date:  08/25/2014  Time:  10:16 AM  Type of Therapy:  Psychoeducational Skills  Participation Level:  Active  Participation Quality:  Drowsy and Inattentive  Affect:  Blunted  Cognitive:  Lacking  Insight:  Lacking  Engagement in Group:  Lacking and Limited  Modes of Intervention:  Discussion, Education and Exploration  Summary of Progress/Problems:  Leonia Reader 08/25/2014, 10:16 AM

## 2014-08-25 NOTE — Progress Notes (Signed)
Psychoeducational Group Note  Date:  08/25/2014 Time:  2244  Group Topic/Focus:  Wrap-Up Group:   The focus of this group is to help patients review their daily goal of treatment and discuss progress on daily workbooks.  Participation Level: Did Not Attend  Participation Quality:  Not Applicable  Affect:  Not Applicable  Cognitive:  Not Applicable  Insight:  Not Applicable  Engagement in Group: Not Applicable  Additional Comments:  The patient did not attend group this evening.   Archie Balboa S 08/25/2014, 10:45 PM

## 2014-08-25 NOTE — [facility] Group Notes (Signed)
[place] Group Notes:  (Clinical Social Work)  08/25/2014  11:15-12:00PM  Summary of Progress/Problems:   The main focus of today's process group was to discuss patients' feelings about hospitalization, the stigma attached to mental health, and sources of motivation to stay well.  We then worked to identify a specific plan to avoid future hospitalizations when discharged from the hospital for this admission.  The patient expressed that she feels protected in the hospital, feels that it is keeping her out of trouble to be here.  Type of Therapy:  Group Therapy - Process  Participation Level:  Active  Participation Quality:  Attentive  Affect:  Blunted and Depressed  Cognitive:  Oriented  Insight:  Developing/Improving  Engagement in Therapy:  Developing/Improving  Modes of Intervention:  Exploration, Discussion  Selmer Dominion, LCSW 08/25/2014, 1:10 PM

## 2014-08-25 NOTE — Progress Notes (Addendum)
Spoke with patient 1:1 who states she is not doing well. "I am not happy with my blood sugar. It is too high." Pt irritable, anxious in affect with congruent mood. Appears to thought block though this could be more related to memory problems. Pt asking questions about meds and information was provided. She was med compliant and did not request any prn medications. Offered support and reassurance. Fall precautions reviewed and encouraged. Explained her CBG could be slightly elevated due to current anxiety as well as not being in her home environment with her normal routine, diet. She verbalized understanding but remains concerned. She denies SI/HI/AVH and remains safe. Jamie Kato

## 2014-08-25 NOTE — Progress Notes (Signed)
Patient ID: Elizabeth Thompson, female   DOB: 20-Feb-1951, 63 y.o.   MRN: [id number] [facility] MD Progress Note  08/25/2014 11:48 AM Elizabeth Thompson  MRN:  [id number] Subjective: "I feel great. I'm not feeling any depression or anything. I am hoping to go Monday."   Objective: Patient was seen and her chart was reviewed. Pt is very calm, coopeartive, alert/oriented x4, and answering questions appropriately. Pt denies depression/anxiety and also denies SI, HI, and AVH. Pt reports that she is doing much better.     Diagnosis:   DSM5: Primary psychiatric diagnosis:  Schizoaffective disorder,depressed type ,multiple episodes ,currently in acute episode   Secondary psychiatric diagnosis:  Mild Neurocognitive disorder Tobacco use disorder   Non psychiatric diagnosis:  Hypertension  Glaucoma  DM     Total Time spent with patient: 30 minutes   Past Medical History  Diagnosis Date  . Hypertension   . Bell's palsy   . Glaucoma   . Diabetes mellitus without complication    ADL's:  Intact  Sleep: Good  Appetite:  Good  Suicidal Ideation: denies Plan:  denies Homicidal Ideation:  Yes has thoughts about hurting her niece.  AEB (as evidenced by):  Psychiatric Specialty Exam: Physical Exam  Review of Systems  Constitutional: Negative.   HENT: Negative.   Eyes: Negative.   Respiratory: Negative.   Cardiovascular: Negative.   Gastrointestinal: Negative.   Genitourinary: Negative.   Musculoskeletal: Negative.   Skin: Negative.   Neurological: Negative.   Endo/Heme/Allergies: Negative.   Psychiatric/Behavioral: Positive for depression. The patient is nervous/anxious.     Blood pressure 117/83, pulse 109, temperature 97.8 F (36.6 C), temperature source Oral, resp. rate 18, height _0  (1.626 m), weight 86.637 kg (191 lb).Body mass index is 32.77 kg/(m^2).  General Appearance: Casual  Eye Contact::  Good  Speech:  Clear and Coherent  Volume:  Normal  Mood:  Euthymic    Affect:  Congruent  Thought Process:  Coherent and Goal Directed  Orientation:  Full (Time, Place, and Person)  Thought Content:  WDL  Suicidal Thoughts:  No   Homicidal Thoughts:  No  Memory:  Immediate;   Fair Recent;   Fair Remote;   Fair  Judgement:  Impaired  Insight:  Lacking  Psychomotor Activity:  Decreased  Concentration:  Fair  Recall:  [employer] of Knowledge:Good  Language: Good  Akathisia:  No  Handed:  Right  AIMS (if indicated):     Assets:  Communication Skills Desire for Improvement Physical Health  Sleep:  Number of Hours: 6.75   Musculoskeletal: Strength & Muscle Tone: within normal limits Gait & Station: normal Patient leans: N/A  Current Medications: Current Facility-Administered Medications  Medication Dose Route Frequency Provider Last Rate Last Dose  . acetaminophen (TYLENOL) tablet 650 mg  650 mg Oral Q6H PRN Shuvon Rankin, NP      . albuterol (PROVENTIL HFA;VENTOLIN HFA) 108 (90 BASE) MCG/ACT inhaler 2 puff  2 puff Inhalation Q6H PRN Shuvon Rankin, NP      . alum & mag hydroxide-simeth (MAALOX/MYLANTA) 200-200-20 MG/5ML suspension 30 mL  30 mL Oral Q4H PRN Shuvon Rankin, NP      . ARIPiprazole (ABILIFY) tablet 20 mg  20 mg Oral QHS Ursula Alert, MD   20 mg at 08/24/14 2120  . benztropine (COGENTIN) tablet 0.5 mg  0.5 mg Oral QHS Mojeed Akintayo   0.5 mg at 08/24/14 2120  . donepezil (ARICEPT) tablet 5 mg  5 mg Oral QHS Saramma Eappen,  MD   5 mg at 08/24/14 2120  . feeding supplement (GLUCERNA SHAKE) (GLUCERNA SHAKE) liquid 237 mL  237 mL Oral TID BM Saramma Eappen, MD   237 mL at 08/24/14 1400  . glimepiride (AMARYL) tablet 4 mg  4 mg Oral Q breakfast Shuvon Rankin, NP   4 mg at 08/25/14 0804  . hydrOXYzine (ATARAX/VISTARIL) tablet 25 mg  25 mg Oral Q6H PRN Ursula Alert, MD   25 mg at 08/24/14 0913  . lamoTRIgine (LAMICTAL) tablet 100 mg  100 mg Oral QPM Saramma Eappen, MD   100 mg at 08/24/14 1605  . latanoprost (XALATAN) 0.005 % ophthalmic  solution 1 drop  1 drop Both Eyes QHS Shuvon Rankin, NP   1 drop at 08/24/14 2123  . magnesium hydroxide (MILK OF MAGNESIA) suspension 30 mL  30 mL Oral Daily PRN Shuvon Rankin, NP      . menthol-cetylpyridinium (CEPACOL) lozenge 3 mg  1 lozenge Oral PRN Ursula Alert, MD      . metFORMIN (GLUCOPHAGE) tablet 1,000 mg  1,000 mg Oral BID WC Shuvon Rankin, NP   1,000 mg at 08/25/14 0804  . mirtazapine (REMERON) tablet 7.5 mg  7.5 mg Oral QHS Saramma Eappen, MD   7.5 mg at 08/24/14 2120  . multivitamin with minerals tablet 1 tablet  1 tablet Oral Daily Shuvon Rankin, NP   1 tablet at 08/25/14 0804  . ondansetron (ZOFRAN-ODT) disintegrating tablet 4 mg  4 mg Oral Q8H PRN Benjamine Mola, FNP   4 mg at 08/20/14 1915  . pantoprazole (PROTONIX) EC tablet 20 mg  20 mg Oral BID AC Ursula Alert, MD   20 mg at 08/25/14 0649  . simvastatin (ZOCOR) tablet 40 mg  40 mg Oral Daily Shuvon Rankin, NP   40 mg at 08/25/14 7741    Lab Results:  Results for orders placed during the hospital encounter of 08/16/14 (from the past 48 hour(s))  GLUCOSE, CAPILLARY     Status: Abnormal   Collection Time    08/23/14  5:04 PM      Result Value Ref Range   Glucose-Capillary 168 (*) 70 - 99 mg/dL   Comment 1 Documented in Chart     Comment 2 Notify RN    BASIC METABOLIC PANEL     Status: Abnormal   Collection Time    08/23/14  7:33 PM      Result Value Ref Range   Sodium 131 (*) 137 - 147 mEq/L   Potassium 4.9  3.7 - 5.3 mEq/L   Chloride 90 (*) 96 - 112 mEq/L   CO2 25  19 - 32 mEq/L   Glucose, Bld 176 (*) 70 - 99 mg/dL   BUN 11  6 - 23 mg/dL   Creatinine, Ser 0.80  0.50 - 1.10 mg/dL   Calcium 11.2 (*) 8.4 - 10.5 mg/dL   GFR calc non Af Amer 76 (*) >90 mL/min   GFR calc Af Amer 88 (*) >90 mL/min   Comment: (NOTE)     The eGFR has been calculated using the CKD EPI equation.     This calculation has not been validated in all clinical situations.     eGFR's persistently <90 mL/min signify possible Chronic Kidney      Disease.   Anion gap 16 (*) 5 - 15   Comment: Performed at [facility], CAPILLARY     Status: Abnormal   Collection Time    08/23/14  8:42 PM  Result Value Ref Range   Glucose-Capillary 248 (*) 70 - 99 mg/dL  GLUCOSE, CAPILLARY     Status: Abnormal   Collection Time    08/24/14  5:55 AM      Result Value Ref Range   Glucose-Capillary 193 (*) 70 - 99 mg/dL   Comment 1 Notify RN    GLUCOSE, CAPILLARY     Status: Abnormal   Collection Time    08/24/14 12:08 PM      Result Value Ref Range   Glucose-Capillary 141 (*) 70 - 99 mg/dL   Comment 1 Notify RN    GLUCOSE, CAPILLARY     Status: Abnormal   Collection Time    08/24/14  5:11 PM      Result Value Ref Range   Glucose-Capillary 229 (*) 70 - 99 mg/dL  GLUCOSE, CAPILLARY     Status: Abnormal   Collection Time    08/25/14  5:57 AM      Result Value Ref Range   Glucose-Capillary 134 (*) 70 - 99 mg/dL    Physical Findings: AIMS: Facial and Oral Movements Muscles of Facial Expression: None, normal Lips and Perioral Area: None, normal Jaw: None, normal Tongue: None, normal,Extremity Movements Upper (arms, wrists, hands, fingers): None, normal Lower (legs, knees, ankles, toes): None, normal, Trunk Movements Neck, shoulders, hips: None, normal, Overall Severity Severity of abnormal movements (highest score from questions above): None, normal Incapacitation due to abnormal movements: None, normal Patient's awareness of abnormal movements (rate only patient's report): No Awareness, Dental Status Current problems with teeth and/or dentures?: Yes (no teeth) Does patient usually wear dentures?: Yes (don't have with her)  CIWA:  CIWA-Ar Total: 0 COWS:  COWS Total Score: 1  Treatment Plan Summary:  Daily contact with patient to assess and evaluate symptoms and progress in treatment Medication management  Plan: 1.Continue  crisis management and stabilization. 2. Medication management to reduce  current symptoms to base line and improve the patient's overall level of functioning: -Will increase Abilify to 39m po qhs mood/psychosis - Will continue Remeron at a low dose of 7.5 mg at bedtime for sleep. An increase was not needed.  -Will continue Lamictal 100 mg po daily for mood stabilization changed to QPM -2/2 sedation in the AM. -Cogentin 0.536mpo qhs for EPS prevention -Continue Aricept for memory issues ,patient seems to have mild neurocognitive issues ,has problems at home taking care of the house as well as cooking and organizing things. 3.Will DC Lisinopril.Patient to be referred to PCP on discharge for assessing the need for an antihypertensive in the future.  4. Develop treatment plan to decrease risk of relapse upon discharge and the need for  readmission. 5. Psycho-social education regarding relapse prevention and self care. 6. Health care follow up as needed for medical problems.  Medical Decision Making Problem Points:  Established problem, stable/improving (1), Review of last therapy session (1) and Review of psycho-social stressors (1) Data Points:  Order Aims Assessment (2) Review of medication regiment & side effects (2)  I certify that inpatient services furnished can reasonably be expected to improve the patient's condition.   WiBenjamine MolaFNP-BC 08/25/2014, 11:48 AM I agreed with findings and treatment plan of this patient

## 2014-08-25 NOTE — Progress Notes (Signed)
D. Pt has been up and has been visible in milieu this evening, did not attend evening wrap-up group. Pt appears irritable and confused this evening, pt did refuse evening CBG draw and appeared irritated at engaging with staff or peers. Pt did receive medications without incident this evening. A. Support and encouragement provided. R. Safety maintained, will continue to monitor.

## 2014-08-26 LAB — GLUCOSE, CAPILLARY
Glucose-Capillary: 117 mg/dL — ABNORMAL HIGH (ref 70–99)
Glucose-Capillary: 215 mg/dL — ABNORMAL HIGH (ref 70–99)
Glucose-Capillary: 196 mg/dL — ABNORMAL HIGH (ref 70–99)
Glucose-Capillary: 176 mg/dL — ABNORMAL HIGH (ref 70–99)

## 2014-08-26 NOTE — Progress Notes (Signed)
Adult Psychoeducational Group Note  Date:  08/26/2014 Time:  9:23 PM  Group Topic/Focus:  Wrap-Up Group:   The focus of this group is to help patients review their daily goal of treatment and discuss progress on daily workbooks.  Participation Level:  Active  Participation Quality:  Appropriate  Affect:  Appropriate  Cognitive:  Appropriate  Insight: Appropriate  Engagement in Group:  Engaged  Modes of Intervention:  Discussion  Additional Comments:  Pt attended wrap-up group this evening. Pt participate in group with peers.   Alany Borman A 08/26/2014, 9:23 PM

## 2014-08-26 NOTE — Progress Notes (Signed)
Patient flat, sad and depressed this evening both in affect and mood. Patient stated, "I'm not good tonight." Asked patient to elaborate but patient stated, "I'd rather not say." Gently asked if this was related to her memory and confusion and patient stated "yes." Patient rated her anxiety a 10/10. CBG 176. Diet and fall teaching provided. Medicated per orders and pt compliant. Denies pain and physical problems. No SI/HI/AVH and pt is safe. Jamie Kato

## 2014-08-26 NOTE — BHH Group Notes (Signed)
[place] Group Notes:  Healthy coping skills  Date:  08/26/2014  Time:  10:37 AM  Type of Therapy:  Nurse Education  Participation Level:  Active  Participation Quality:  Appropriate  Affect:  Appropriate  Cognitive:  Appropriate  Insight:  Appropriate  Engagement in Group:  Engaged  Modes of Intervention:  Discussion  Summary of Progress/Problems:  Delman Kitten 08/26/2014, 10:37 AM

## 2014-08-26 NOTE — Plan of Care (Signed)
Problem: Diagnosis: Increased Risk For Suicide Attempt Goal: STG-Patient Will Comply With Medication Regime Outcome: Progressing Patient is med compliant and asks appropriate questions regarding meds, side effects, etc.  Problem: Alteration in mood Goal: STG-Patient is able to discuss feelings and issues (Patient is able to discuss feelings and issues leading to depression)  Outcome: Not Progressing Patient stated again tonight that she is "not good." Patient would not elaborate on the reasons why stating, "I'd rather not say."

## 2014-08-26 NOTE — BHH Group Notes (Signed)
[place] Group Notes:  (Clinical Social Work)  08/26/2014   11:15am-12:00pm  Summary of Progress/Problems:  The main focus of today's process group was to listen to a variety of genres of music and to identify that different types of music provoke different responses.  The patient then was able to identify personally what was soothing for them, as well as energizing.  Handouts were used to record feelings evoked, as well as how patient can personally use this knowledge in sleep habits, with depression, and with other symptoms.  The patient expressed understanding of concepts, as well as knowledge of how each type of music affected her and how this can be used at home as a wellness/recovery tool.  She enjoyed and interacted while she was there but left early.  Type of Therapy:  Music Therapy   Participation Level:  Active  Participation Quality:  Attentive  Affect:  Blunted  Cognitive:  Oriented  Insight:  Engaged  Engagement in Therapy:  Engaged  Modes of Intervention:   Activity, Exploration  Selmer Dominion, LCSW 08/26/2014, 12:30pm

## 2014-08-26 NOTE — Progress Notes (Signed)
Pt resting in bed with eyes closed. No distress noted. Will continue to monitor closely and evaluate for stabilization.

## 2014-08-26 NOTE — Progress Notes (Signed)
Patient ID: Elizabeth Thompson, female   DOB: Oct 20, 1951, 63 y.o.   MRN: [id number] D. Patient presents with depressed mood, affect congruent again today. [name] states '' I didn't sleep that good, I woke up in the middle of the night, I think I took my sleeping pill too early'' She remains blunted. Patient denies any AH/VH . She denies any SI/HI. She remains disheveled on the unit, but attending unit programming, and cooperative with staff. A. Medications given as ordered. Support and encouragement provided. Discussed patient progress with Heloise Purpura NP. Patient is safe. No further voiced concerns. Will continue to monitor q 15 minutes for safety.

## 2014-08-26 NOTE — BHH Group Notes (Signed)
[place] Group Notes:  Self inventory  Date:  08/26/2014  Time:  0930  Type of Therapy:  Nurse Education  Participation Level:  Active  Participation Quality:  Appropriate  Affect:  Appropriate  Cognitive:  Alert  Insight:  Appropriate  Engagement in Group:  Engaged  Modes of Intervention:  Discussion  Summary of Progress/Problems:  Delman Kitten 08/26/2014, 10:36 AM

## 2014-08-27 DIAGNOSIS — F09 Unspecified mental disorder due to known physiological condition: Secondary | ICD-10-CM

## 2014-08-27 DIAGNOSIS — F17200 Nicotine dependence, unspecified, uncomplicated: Secondary | ICD-10-CM

## 2014-08-27 LAB — GLUCOSE, CAPILLARY
GLUCOSE-CAPILLARY: 149 mg/dL — AB (ref 70–99)
Glucose-Capillary: 139 mg/dL — ABNORMAL HIGH (ref 70–99)

## 2014-08-27 MED ORDER — GLIMEPIRIDE 4 MG PO TABS: 4.0000 mg | ORAL_TABLET | Freq: Every day | ORAL | Status: DC

## 2014-08-27 MED ORDER — DONEPEZIL HCL 5 MG PO TABS: 5.0000 mg | ORAL_TABLET | Freq: Every day | ORAL | Status: DC

## 2014-08-27 MED ORDER — LAMOTRIGINE 100 MG PO TABS: 100.0000 mg | ORAL_TABLET | Freq: Every evening | ORAL | Status: AC

## 2014-08-27 MED ORDER — METFORMIN HCL 1000 MG PO TABS: 1000.0000 mg | ORAL_TABLET | Freq: Two times a day (BID) | ORAL | Status: DC

## 2014-08-27 MED ORDER — SIMVASTATIN 40 MG PO TABS: 40.0000 mg | ORAL_TABLET | Freq: Every day | ORAL | Status: DC

## 2014-08-27 MED ORDER — MIRTAZAPINE 7.5 MG PO TABS: 7.5000 mg | ORAL_TABLET | Freq: Every day | ORAL | Status: DC

## 2014-08-27 MED ORDER — LATANOPROST 0.005 % OP SOLN: 1.0000 [drp] | Freq: Every day | OPHTHALMIC | Status: DC

## 2014-08-27 MED ORDER — ALBUTEROL SULFATE HFA 108 (90 BASE) MCG/ACT IN AERS: 2.0000 | INHALATION_SPRAY | Freq: Four times a day (QID) | RESPIRATORY_TRACT | Status: DC | PRN

## 2014-08-27 MED ORDER — HYDROXYZINE HCL 25 MG PO TABS: 25.0000 mg | ORAL_TABLET | Freq: Four times a day (QID) | ORAL | Status: DC | PRN

## 2014-08-27 MED ORDER — LISINOPRIL 40 MG PO TABS: 40.0000 mg | ORAL_TABLET | Freq: Every day | ORAL | Status: DC

## 2014-08-27 MED ORDER — BENZTROPINE MESYLATE 0.5 MG PO TABS: 0.5000 mg | ORAL_TABLET | Freq: Every day | ORAL | Status: DC

## 2014-08-27 MED ORDER — ARIPIPRAZOLE 20 MG PO TABS: 20.0000 mg | ORAL_TABLET | Freq: Every day | ORAL | Status: AC

## 2014-08-27 MED ORDER — ADULT MULTIVITAMIN W/MINERALS CH: 1.0000 | ORAL_TABLET | Freq: Every day | ORAL | Status: DC

## 2014-08-27 NOTE — Progress Notes (Signed)
[facility] Adult Case Management Discharge Plan :  Will you be returning to the same living situation after discharge: Yes,  home At discharge, do you have transportation home?:Yes,  [place] Do you have the ability to pay for your medications:Yes,  MCD  Release of information consent forms completed and in the chart;  Patient's signature needed at discharge.  Patient to Follow up at: Follow-up Information   Follow up with [facility]. (Transitional care team will contact you at discharge. Walk in between 8am-9am Monday through Friday for hospital follow-up/medication management/assessment for therapy services. )    Contact information:   [address], [place] [address] Phone: [phone] Fax: [phone]      Follow up with [facility] On 08/29/2014. (Appt with Dr. Benito Mccreedy at 1:15PM for hospital follow-up. Please request that Dr. Benito Mccreedy make referral for neurology consult at this appt. )    Contact information:   ATTN: Dr. Benito Mccreedy [address] [place], [place] [address] Phone: [phone] Fax: [phone]      Patient denies SI/HI:   Yes,  yes    Safety Planning and Suicide Prevention discussed:  Yes,  yes  Trish Mage 08/27/2014, 4:49 PM

## 2014-08-27 NOTE — BHH Suicide Risk Assessment (Signed)
Demographic Factors:  Unemployed  Total Time spent with patient: 20 minutes  Psychiatric Specialty Exam: Physical Exam  Constitutional: She is oriented to person, place, and time. She appears well-developed and well-nourished.  HENT:  Head: Normocephalic and atraumatic.  Eyes: Pupils are equal, round, and reactive to light.  Neck: Normal range of motion.  Cardiovascular: Normal rate.   Respiratory: Effort normal.  GI: Soft.  Musculoskeletal: Normal range of motion.  Neurological: She is alert and oriented to person, place, and time.  Skin: Skin is warm.  Psychiatric: Her speech is normal and behavior is normal. Judgment and thought content normal. Her mood appears anxious (stable ,anxious about her living situation). Cognition and memory are impaired. She expresses no homicidal and no suicidal ideation.    Review of Systems  Constitutional: Negative.   HENT: Negative.   Eyes: Negative.   Respiratory: Negative.   Cardiovascular: Negative.   Gastrointestinal: Negative.   Genitourinary: Negative.   Musculoskeletal: Negative.   Skin: Negative.   Psychiatric/Behavioral: Negative for suicidal ideas and hallucinations. The patient is nervous/anxious (stable ,about her living situation).     Blood pressure 141/74, pulse 96, temperature 98.3 F (36.8 C), temperature source Oral, resp. rate 16, height _0  (1.626 m), weight 86.637 kg (191 lb).Body mass index is 32.77 kg/(m^2).  General Appearance: Casual  Eye Contact::  Fair  Speech:  Clear and Coherent  Volume:  Normal  Mood:  Anxious  Affect:  Appropriate  Thought Process:  Coherent  Orientation:  Full (Time, Place, and Person)  Thought Content:  denies AH/VH  Suicidal Thoughts:  No  Homicidal Thoughts:  No  Memory:  Immediate;   Fair Recent;   Fair Remote;   Fair  Judgement:  Fair  Insight:  Fair  Psychomotor Activity:  Normal  Concentration:  Fair  Recall:  [employer] of [place]  Language: Fair  Akathisia:   No    AIMS (if indicated):   0  Assets:  Communication Skills  Sleep:  Number of Hours: 5.75    Musculoskeletal: Strength & Muscle Tone: within normal limits Gait & Station: normal Patient leans: N/A   Mental Status Per Nursing Assessment::   On Admission:     Current Mental Status by Physician: Denies AH/VH/SI/HI  Loss Factors: Financial problems/change in socioeconomic status  Historical Factors: Impulsivity  Risk Reduction Factors:   Living with another person, especially a relative and Positive social support  Continued Clinical Symptoms:  Previous Psychiatric Diagnoses and Treatments  Cognitive Features That Contribute To Risk:  Thought constriction (tunnel vision)    Suicide Risk:  Minimal: No identifiable suicidal ideation.  Patients presenting with no risk factors but with morbid ruminations; may be classified as minimal risk based on the severity of the depressive symptoms  Discharge Diagnoses:  DSM5:  Primary psychiatric diagnosis:  Schizoaffective disorder,depressed type ,multiple episodes ,currently in acute episode   Secondary psychiatric diagnosis:  Mild Neurocognitive disorder  Tobacco use disorder   Non psychiatric diagnosis:  Hypertension  Glaucoma  DM    Past Medical History  Diagnosis Date  . Hypertension   . Bell's palsy   . Glaucoma   . Diabetes mellitus without complication   . Schizo-affective psychosis     Plan Of Care/Follow-up recommendations:  Activity:  NO Restrictions  Is patient on multiple antipsychotic therapies at discharge:  No   Has Patient had three or more failed trials of antipsychotic monotherapy by history:  No  Recommended Plan for Multiple Antipsychotic Therapies:  NA    Elizabeth Thompson 08/27/2014, 12:49 PM

## 2014-08-27 NOTE — Progress Notes (Signed)
Patient ID: Elizabeth Thompson, female   DOB: [birth year], 63 y.o.   MRN: [id number] D: Patient presents with sad and depressed mood.  She continues to say she "doesn't feel good" and is reluctant to elaborate.  She has minimal interaction with staff and interacts with her peers occasionally.  She is guarded and appears preoccupied.  She denies any SI/HI/AVH.  A: Continue to monitor medication management and MD orders.  Safety checks completed every 15 minutes. R: Patient is cooperative; her behavior is appropriate.
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
## 2014-08-27 NOTE — Progress Notes (Signed)
Patient ID: Elizabeth Thompson, female   DOB: [birth year], 63 y.o.   MRN: [id number] [facility] MD Progress Note  08/27/2014 9:16 AM Elizabeth Thompson  MRN:  [id number] Subjective: "I feel a lot better than I did when I came in. I feel like everything is working well. I still hope I can go on Monday. I feel good."   Objective: Patient was seen and her chart was reviewed. Pt is very calm, coopeartive, alert/oriented x4, and answering questions appropriately. Pt denies depression/anxiety and also denies SI, HI, and AVH. Pt continues to report that she is doing well and that she would like to leave on Monday. Pt is participating well with others on the unit and continues to interact appropriately with staff; compliant with medications.     Diagnosis:   DSM5: Primary psychiatric diagnosis:  Schizoaffective disorder,depressed type ,multiple episodes ,currently in acute episode   Secondary psychiatric diagnosis:  Mild Neurocognitive disorder Tobacco use disorder   Non psychiatric diagnosis:  Hypertension  Glaucoma  DM     Total Time spent with patient: 30 minutes   Past Medical History  Diagnosis Date  . Hypertension   . Bell's palsy   . Glaucoma   . Diabetes mellitus without complication    ADL's:  Intact  Sleep: Good  Appetite:  Good  Suicidal Ideation: denies Plan:  denies Homicidal Ideation:  Yes has thoughts about hurting her niece.  AEB (as evidenced by):  Psychiatric Specialty Exam: Physical Exam  Review of Systems  Constitutional: Negative.   HENT: Negative.   Eyes: Negative.   Respiratory: Negative.   Cardiovascular: Negative.   Gastrointestinal: Negative.   Genitourinary: Negative.   Musculoskeletal: Negative.   Skin: Negative.   Neurological: Negative.   Endo/Heme/Allergies: Negative.   Psychiatric/Behavioral: Positive for depression. The patient is nervous/anxious.     Blood pressure 141/74, pulse 96, temperature 98.3 F (36.8 C), temperature source  Oral, resp. rate 16, height _0  (1.626 m), weight 86.637 kg (191 lb).Body mass index is 32.77 kg/(m^2).  General Appearance: Casual  Eye Contact::  Good  Speech:  Clear and Coherent  Volume:  Normal  Mood:  Euthymic   Affect:  Congruent  Thought Process:  Coherent and Goal Directed  Orientation:  Full (Time, Place, and Person)  Thought Content:  WDL  Suicidal Thoughts:  No   Homicidal Thoughts:  No  Memory:  Immediate;   Fair Recent;   Fair Remote;   Fair  Judgement:  Impaired  Insight:  Lacking  Psychomotor Activity:  Decreased  Concentration:  Fair  Recall:  [employer] of Knowledge:Good  Language: Good  Akathisia:  No  Handed:  Right  AIMS (if indicated):     Assets:  Communication Skills Desire for Improvement Physical Health  Sleep:  Number of Hours: 5.75   Musculoskeletal: Strength & Muscle Tone: within normal limits Gait & Station: normal Patient leans: N/A  Current Medications: Current Facility-Administered Medications  Medication Dose Route Frequency Provider Last Rate Last Dose  . acetaminophen (TYLENOL) tablet 650 mg  650 mg Oral Q6H PRN Shuvon Rankin, NP      . albuterol (PROVENTIL HFA;VENTOLIN HFA) 108 (90 BASE) MCG/ACT inhaler 2 puff  2 puff Inhalation Q6H PRN Shuvon Rankin, NP      . alum & mag hydroxide-simeth (MAALOX/MYLANTA) 200-200-20 MG/5ML suspension 30 mL  30 mL Oral Q4H PRN Shuvon Rankin, NP      . ARIPiprazole (ABILIFY) tablet 20 mg  20 mg Oral QHS Saramma  Eappen, MD   20 mg at 08/26/14 2139  . benztropine (COGENTIN) tablet 0.5 mg  0.5 mg Oral QHS Mojeed Akintayo   0.5 mg at 08/26/14 2139  . donepezil (ARICEPT) tablet 5 mg  5 mg Oral QHS Ursula Alert, MD   5 mg at 08/26/14 2139  . feeding supplement (GLUCERNA SHAKE) (GLUCERNA SHAKE) liquid 237 mL  237 mL Oral TID BM Saramma Eappen, MD   237 mL at 08/24/14 1400  . glimepiride (AMARYL) tablet 4 mg  4 mg Oral Q breakfast Shuvon Rankin, NP   4 mg at 08/27/14 0817  . hydrOXYzine (ATARAX/VISTARIL)  tablet 25 mg  25 mg Oral Q6H PRN Ursula Alert, MD   25 mg at 08/24/14 0913  . lamoTRIgine (LAMICTAL) tablet 100 mg  100 mg Oral QPM Saramma Eappen, MD   100 mg at 08/26/14 1706  . latanoprost (XALATAN) 0.005 % ophthalmic solution 1 drop  1 drop Both Eyes QHS Shuvon Rankin, NP   1 drop at 08/26/14 2139  . magnesium hydroxide (MILK OF MAGNESIA) suspension 30 mL  30 mL Oral Daily PRN Shuvon Rankin, NP      . menthol-cetylpyridinium (CEPACOL) lozenge 3 mg  1 lozenge Oral PRN Ursula Alert, MD      . metFORMIN (GLUCOPHAGE) tablet 1,000 mg  1,000 mg Oral BID WC Shuvon Rankin, NP   1,000 mg at 08/27/14 0817  . mirtazapine (REMERON) tablet 7.5 mg  7.5 mg Oral QHS Ursula Alert, MD   7.5 mg at 08/26/14 2139  . multivitamin with minerals tablet 1 tablet  1 tablet Oral Daily Shuvon Rankin, NP   1 tablet at 08/27/14 0817  . ondansetron (ZOFRAN-ODT) disintegrating tablet 4 mg  4 mg Oral Q8H PRN Benjamine Mola, FNP   4 mg at 08/20/14 1915  . pantoprazole (PROTONIX) EC tablet 20 mg  20 mg Oral BID AC Saramma Eappen, MD   20 mg at 08/27/14 0617  . simvastatin (ZOCOR) tablet 40 mg  40 mg Oral Daily Shuvon Rankin, NP   40 mg at 08/27/14 0370    Lab Results:  Results for orders placed during the hospital encounter of 08/16/14 (from the past 48 hour(s))  GLUCOSE, CAPILLARY     Status: Abnormal   Collection Time    08/25/14 11:44 AM      Result Value Ref Range   Glucose-Capillary 160 (*) 70 - 99 mg/dL  GLUCOSE, CAPILLARY     Status: Abnormal   Collection Time    08/25/14  5:12 PM      Result Value Ref Range   Glucose-Capillary 208 (*) 70 - 99 mg/dL  GLUCOSE, CAPILLARY     Status: Abnormal   Collection Time    08/25/14  9:12 PM      Result Value Ref Range   Glucose-Capillary 159 (*) 70 - 99 mg/dL   Comment 1 Notify RN     Comment 2 Documented in Chart    GLUCOSE, CAPILLARY     Status: Abnormal   Collection Time    08/26/14  6:11 AM      Result Value Ref Range   Glucose-Capillary 117 (*) 70 - 99  mg/dL   Comment 1 Notify RN    GLUCOSE, CAPILLARY     Status: Abnormal   Collection Time    08/26/14 11:05 AM      Result Value Ref Range   Glucose-Capillary 215 (*) 70 - 99 mg/dL  GLUCOSE, CAPILLARY     Status: Abnormal  Collection Time    08/26/14  5:17 PM      Result Value Ref Range   Glucose-Capillary 196 (*) 70 - 99 mg/dL   Comment 1 Notify RN    GLUCOSE, CAPILLARY     Status: Abnormal   Collection Time    08/26/14  8:59 PM      Result Value Ref Range   Glucose-Capillary 176 (*) 70 - 99 mg/dL  GLUCOSE, CAPILLARY     Status: Abnormal   Collection Time    08/27/14  6:02 AM      Result Value Ref Range   Glucose-Capillary 139 (*) 70 - 99 mg/dL    Physical Findings: AIMS: Facial and Oral Movements Muscles of Facial Expression: None, normal Lips and Perioral Area: None, normal Jaw: None, normal Tongue: None, normal,Extremity Movements Upper (arms, wrists, hands, fingers): None, normal Lower (legs, knees, ankles, toes): None, normal, Trunk Movements Neck, shoulders, hips: None, normal, Overall Severity Severity of abnormal movements (highest score from questions above): None, normal Incapacitation due to abnormal movements: None, normal Patient's awareness of abnormal movements (rate only patient's report): No Awareness, Dental Status Current problems with teeth and/or dentures?: Yes (no teeth) Does patient usually wear dentures?: Yes (don't have with her)  CIWA:  CIWA-Ar Total: 0 COWS:  COWS Total Score: 1  Treatment Plan Summary:  Daily contact with patient to assess and evaluate symptoms and progress in treatment Medication management  Plan: 1.Continue  crisis management and stabilization. 2. Medication management to reduce current symptoms to base line and improve the patient's overall level of functioning: -Will increase Abilify to 85m po qhs mood/psychosis - Will continue Remeron at a low dose of 7.5 mg at bedtime for sleep. An increase was not needed; pt slept  well.  -Will continue Lamictal 100 mg po daily for mood stabilization changed to QPM -2/2 sedation in the AM. -Cogentin 0.56mpo qhs for EPS prevention -Continue Aricept for memory issues ,patient seems to have mild neurocognitive issues ,has problems at home taking care of the house as well as cooking and organizing things. 3.Will DC Lisinopril.Patient to be referred to PCP on discharge for assessing the need for an antihypertensive in the future. 4. Develop treatment plan to decrease risk of relapse upon discharge and the need for  readmission. 5. Psycho-social education regarding relapse prevention and self care. 6. Health care follow up as needed for medical problems.  Medical Decision Making Problem Points:  Established problem, stable/improving (1), Review of last therapy session (1) and Review of psycho-social stressors (1) Data Points:  Order Aims Assessment (2) Review of medication regiment & side effects (2)  I certify that inpatient services furnished can reasonably be expected to improve the patient's condition.   WiBenjamine MolaFNP-BC 08/26/2014, 10:16 AM I agreed with findings and treatment plan of this patient

## 2014-08-27 NOTE — Progress Notes (Signed)
Patient ID: Elizabeth Thompson, female   DOB: 01-28-1951, 63 y.o.   MRN: [id number] Nursing discharge note:  Patient discharged home per MD order.  Patient received all her personal belongings, prescriptions and medication samples.  Medications and discharge instructions were reviewed with patient and she indicated understanding.  Patient denies any SI/HI/AVH.  Patient left with her niece without incident.

## 2014-08-29 NOTE — Progress Notes (Signed)
Patient Discharge Instructions:  After Visit Summary (AVS):   Faxed to:  08/29/14 Psychiatric Admission Assessment Note:   Faxed to:  08/29/14 Suicide Risk Assessment - Discharge Assessment:   Faxed to:  08/29/14 Faxed/Sent to the Next Level Care provider:  08/29/14 Faxed to [facility] - Dr. Benito Mccreedy @ [phone] Faxed to [facility] @ [place], 08/29/2014, 2:43 PM

## 2014-09-05 ENCOUNTER — Encounter: Payer: Self-pay | Admitting: Neurology

## 2014-09-05 ENCOUNTER — Ambulatory Visit (INDEPENDENT_AMBULATORY_CARE_PROVIDER_SITE_OTHER): Payer: Medicaid Other | Admitting: Neurology

## 2014-09-05 VITALS — BP 120/60 | HR 70 | Ht 64.0 in | Wt 189.0 lb

## 2014-09-05 DIAGNOSIS — R413 Other amnesia: Secondary | ICD-10-CM

## 2014-09-05 DIAGNOSIS — F05 Delirium due to known physiological condition: Secondary | ICD-10-CM

## 2014-09-05 DIAGNOSIS — R410 Disorientation, unspecified: Secondary | ICD-10-CM

## 2014-09-05 DIAGNOSIS — F250 Schizoaffective disorder, bipolar type: Secondary | ICD-10-CM

## 2014-09-05 DIAGNOSIS — F259 Schizoaffective disorder, unspecified: Secondary | ICD-10-CM

## 2014-09-05 HISTORY — DX: Other amnesia: R41.3

## 2014-09-05 NOTE — Progress Notes (Signed)
Reason for visit: Memory disorder  Elizabeth Thompson is a 63 y.o. female  History of present illness:  Elizabeth Thompson is a 63 year old right-handed black female with a history of schizoaffective disorder who was recently admitted to behavioral health around 08/16/2014. The patient began hearing voices, and she was afraid she would kill her pets and herself. The patient has had some alterations in her medications, going from Risperdal to Abilify and Lamictal was added as well. The patient has reported some problems with memory, and low-dose Aricept was added at 5 mg. The patient indicates that she did not have memory issues prior to going into the hospital, but she has been somewhat confused since that time. Her caretaker indicates that her mental status has significantly improved since coming out of the hospital one week ago. The patient never received a CT scan or MRI of the brain. The patient was noted to have hyponatremia with a blood sodium level of 127, repeat of 130. The patient denies any focal numbness on the body or arms or legs, but she feels somewhat weak on the left arm and right leg. She denies any balance issues, headaches, or dizziness. She has a history of prior Bell's palsy in the distant past. Given the changes in mental status, the patient is sent to this office for an evaluation. The patient is not operating a motor vehicle at this time, as she feels confused. The patient requires assistance with keeping up with her medications, but she has always required assistance for this. The patient indicates that she is confused about where things belong in the house.   Past Medical History  Diagnosis Date  . Hypertension   . Bell's palsy   . Glaucoma   . Diabetes mellitus without complication   . Schizo-affective psychosis   . Memory disorder 09/05/2014  . Uterine fibroid     Past Surgical History  Procedure Laterality Date  . Abdominal hysterectomy    . Tonsillectomy and  adenoidectomy    . Appendectomy      Family History  Problem Relation Age of Onset  . Dementia Mother   . Alzheimer's disease Mother   . Heart disease Mother   . Hypertension Mother   . Diabetes Mother   . Diabetes Father   . Alzheimer's disease Sister   . Heart disease Brother   . Heart attack Brother     Social history:  reports that she has been smoking Cigarettes.  She has a 28 pack-year smoking history. She has never used smokeless tobacco. She reports that she does not drink alcohol or use illicit drugs.  Medications:  Current Outpatient Prescriptions on File Prior to Visit  Medication Sig Dispense Refill  . albuterol (PROVENTIL HFA;VENTOLIN HFA) 108 (90 BASE) MCG/ACT inhaler Inhale 2 puffs into the lungs every 6 (six) hours as needed for wheezing or shortness of breath.      . ARIPiprazole (ABILIFY) 20 MG tablet Take 1 tablet (20 mg total) by mouth at bedtime.  30 tablet  0  . benztropine (COGENTIN) 0.5 MG tablet Take 1 tablet (0.5 mg total) by mouth at bedtime.  30 tablet  0  . donepezil (ARICEPT) 5 MG tablet Take 1 tablet (5 mg total) by mouth at bedtime.  30 tablet  0  . glimepiride (AMARYL) 4 MG tablet Take 1 tablet (4 mg total) by mouth daily with breakfast.      . hydrOXYzine (ATARAX/VISTARIL) 25 MG tablet Take 1 tablet (25 mg total)  by mouth every 6 (six) hours as needed for anxiety.  45 tablet  0  . lamoTRIgine (LAMICTAL) 100 MG tablet Take 1 tablet (100 mg total) by mouth every evening.  30 tablet  0  . latanoprost (XALATAN) 0.005 % ophthalmic solution Place 1 drop into both eyes at bedtime.  2.5 mL  12  . lisinopril (PRINIVIL,ZESTRIL) 40 MG tablet Take 1 tablet (40 mg total) by mouth daily.      . metFORMIN (GLUCOPHAGE) 1000 MG tablet Take 1 tablet (1,000 mg total) by mouth 2 (two) times daily with a meal.      . mirtazapine (REMERON) 7.5 MG tablet Take 1 tablet (7.5 mg total) by mouth at bedtime.  30 tablet  0  . Multiple Vitamin (MULTIVITAMIN WITH MINERALS) TABS  tablet Take 1 tablet by mouth daily.      . simvastatin (ZOCOR) 40 MG tablet Take 1 tablet (40 mg total) by mouth daily.  30 tablet     No current facility-administered medications on file prior to visit.      Allergies  Allergen Reactions  . Codeine Nausea And Vomiting  . Prednisone Other (See Comments)    Has to monitor because she is a diabetic     ROS:  Out of a complete 14 system review of symptoms, the patient complains only of the following symptoms, and all other reviewed systems are negative.  Weight loss Dizziness Loss of vision Snoring Incontinence, diarrhea Urinary incontinence Memory loss Depression, anxiety, not enough sleep, decreased energy, change in appetite, disinterest in activities, suicidal thoughts, hallucinations that her auditory, racing thoughts Insomnia  Blood pressure 120/60, pulse 70, height _0  (1.626 m), weight 189 lb (85.73 kg).  Physical Exam  General: The patient is alert and cooperative at the time of the examination. The patient is minimally obese.  Eyes: Pupils are equal, round, and reactive to light. Discs are flat bilaterally.  Neck: The neck is supple, no carotid bruits are noted.  Respiratory: The respiratory examination is clear.  Cardiovascular: The cardiovascular examination reveals a regular rate and rhythm, no obvious murmurs or rubs are noted.  Skin: Extremities are without significant edema.  Neurologic Exam  Mental status: The Mini-Mental status examination done today shows a total score 27/30.  Cranial nerves: Facial symmetry is present. There is good sensation of the face to pinprick and soft touch bilaterally. The strength of the facial muscles and the muscles to head turning and shoulder shrug are normal bilaterally. Speech is well enunciated, no aphasia or dysarthria is noted. Extraocular movements are full. Visual fields are full. The tongue is midline, and the patient has symmetric elevation of the soft palate.  No obvious hearing deficits are noted.  Motor: The motor testing reveals 5 over 5 strength of all 4 extremities. Good symmetric motor tone is noted throughout.  Sensory: Sensory testing is intact to pinprick, soft touch, vibration sensation, and position sense on all 4 extremities. No evidence of extinction is noted.  Coordination: Cerebellar testing reveals good finger-nose-finger and heel-to-shin bilaterally.  Gait and station: Gait is normal. Tandem gait is unsteady. Romberg is negative. No drift is seen.  Reflexes: Deep tendon reflexes are symmetric, but are depressed bilaterally. Toes are downgoing bilaterally.   Assessment/Plan:  1. Schizoaffective disorder  2. Confusion, memory problems  The patient has had a recent admission at behavioral health for suicidal ideations. The patient has headaches confusion around this time, which seems to be improving. The patient will be set up for  further blood work as she had hyponatremia on the recent evaluation. She will have an EEG study, and MRI evaluation of the brain. She will followup in 3 months. Currently, the patient is not operating a motor vehicle.  Jill Alexanders MD 09/05/2014 9:01 PM  [facility] [address] [place], [place] [address]  Phone [phone] Fax [phone]

## 2014-09-05 NOTE — Patient Instructions (Signed)
Schizoaffective Disorder Schizoaffective disorder (ScAD) is a mental illness. It causes symptoms that are a mixture of schizophrenia (a psychotic disorder) and an affective (mood) disorder. The schizophrenic symptoms may include delusions, hallucinations, or odd behavior. The mood symptoms may be similar to major depression or bipolar disorder. ScAD may interfere with personal relationships or normal daily activities. People with ScAD are at increased risk for job loss, social isolation,physical health problems, anxiety and substance use disorders, and suicide. ScAD usually occurs in cycles. Periods of severe symptoms are followed by periods of less severe symptoms or improvement. The illness affects men and women equally but usually appears at an earlier age (teenage or early adult years) in men. People who have family members with schizophrenia, bipolar disorder, or ScAD are at higher risk of developing ScAD. SYMPTOMS  At any one time, people with ScAD may have psychotic symptoms only or both psychotic and mood symptoms. The psychotic symptoms include one or more of the following:  Hearing, seeing, or feeling things that are not there (hallucinations).   Having fixed, false beliefs (delusions). The delusions usually are of being attacked, harassed, cheated, persecuted, or conspired against (paranoid delusions).  Speaking in a way that makes no sense to others (disorganized speech). The psychotic symptoms of ScAD may also include confusing or odd behavior or any of the negative symptoms of schizophrenia. These include loss of motivation for normal daily activities, such as bathing or grooming, withdrawal from other people, and lack of emotions.  The mood symptoms of ScAD occur more often than not. They resemble major depressive disorder or bipolar mania. Symptoms of major depression include depressed mood and four or more of the following:  Loss of interest in usually pleasurable activities  (anhedonia).  Sleeping more or less than normal.  Feeling worthless or excessively guilty.  Lack of energy or motivation.  Trouble concentrating.  Eating more or less than usual.  Thinking a lot about death or suicide. Symptoms of bipolar mania include abnormally elevated or irritable mood and increased energy or activity, plus three or more of the following:   More confidence than normal or feeling that you are able to do anything (grandiosity).  Feeling rested with less sleep than normal.   Being easily distracted.   Talking more than usual or feeling pressured to keep talking.   Feeling that your thoughts are racing.  Engaging in high-risk activities such as buying sprees or foolish business decisions. DIAGNOSIS  ScAD is diagnosed through an assessment by your health care provider. Your health care provider will observe and ask questions about your thoughts, behavior, mood, and ability to function in daily life. Your health care provider may also ask questions about your medical history and use of drugs, including prescription medicines. Your health care provider may also order blood tests and imaging exams. Certain medical conditions and substances can cause symptoms that resemble ScAD. Your health care provider may refer you to a mental health specialist for evaluation.  ScAD is divided into two types. The depressive type is diagnosed if your mood symptoms are limited to major depression. The bipolar type is diagnosed if your mood symptoms are manic or a mixture of manic and depressive symptoms TREATMENT  ScAD is usually a lifelong illness. Long-term treatment is necessary. The following treatments are available:  Medicine. Different types of medicine are used to treat ScAD. The exact combination depends on the type and severity of your symptoms. Antipsychotic medicine is used to control psychotic symptomssuch as delusions, paranoia,  and hallucinations. Mood stabilizers can  even the highs and lows of bipolar manic mood swings. Antidepressant medicines are used to treat major depressive symptoms.  Counseling or talk therapy. Individual, group, or family counseling may be helpful in providing education, support, and guidance. Many people with ScAD also benefit from social skills and job skills (vocational) training. A combination of medicine and counseling is usually best for managing the disorder over time. A procedure in which electricity is applied to the brain through the scalp (electroconvulsive therapy) may be used to treat people with severe manic symptoms that do not respond to medicine and counseling. HOME CARE INSTRUCTIONS   Take all your medicine as prescribed.  Check with your health care provider before starting new prescription or over-the-counter medicines.  Keep all follow up appointments with your health care provider. SEEK MEDICAL CARE IF:   If you are not able to take your medicines as prescribed.  If your symptoms get worse. SEEK IMMEDIATE MEDICAL CARE IF:   You have serious thoughts about hurting yourself or others. Document Released: 04/12/2007 Document Revised: 04/16/2014 Document Reviewed: 07/14/2013 [facility] Patient Information 2015 [place], [place]. This information is not intended to replace advice given to you by your health care provider. Make sure you discuss any questions you have with your health care provider.

## 2014-09-06 ENCOUNTER — Telehealth: Payer: Self-pay | Admitting: Neurology

## 2014-09-06 LAB — COMPREHENSIVE METABOLIC PANEL
ALT: 14 IU/L (ref 0–32)
AST: 14 IU/L (ref 0–40)
Albumin/Globulin Ratio: 1.3 (ref 1.1–2.5)
Albumin: 4.4 g/dL (ref 3.6–4.8)
Alkaline Phosphatase: 60 IU/L (ref 39–117)
BUN / CREAT RATIO: 10 — AB (ref 11–26)
BUN: 9 mg/dL (ref 8–27)
CO2: 27 mmol/L (ref 18–29)
CREATININE: 0.87 mg/dL (ref 0.57–1.00)
Calcium: 11.3 mg/dL — ABNORMAL HIGH (ref 8.7–10.3)
Chloride: 102 mmol/L (ref 97–108)
GFR, EST AFRICAN AMERICAN: 81 mL/min/{1.73_m2} (ref >59)
GFR, EST NON AFRICAN AMERICAN: 71 mL/min/{1.73_m2} (ref >59)
Globulin, Total: 3.3 g/dL (ref 1.5–4.5)
Glucose: 118 mg/dL — ABNORMAL HIGH (ref 65–99)
Potassium: 4.5 mmol/L (ref 3.5–5.2)
Sodium: 142 mmol/L (ref 134–144)
Total Bilirubin: <0.2 mg/dL (ref 0.0–1.2)
Total Protein: 7.7 g/dL (ref 6.0–8.5)

## 2014-09-06 LAB — HIV ANTIBODY (ROUTINE TESTING W REFLEX)
HIV 1/O/2 Abs-Index Value: <1 (ref <1.00)
HIV-1/HIV-2 Ab: NONREACTIVE

## 2014-09-06 LAB — RPR: SYPHILIS RPR SCR: NONREACTIVE

## 2014-09-06 LAB — TSH: TSH: 1.94 u[IU]/mL (ref 0.450–4.500)

## 2014-09-06 LAB — VITAMIN B12: VITAMIN B 12: 1245 pg/mL — AB (ref 211–946)

## 2014-09-06 NOTE — Telephone Encounter (Signed)
I called patient. The blood work shows that the sodium level is unremarkable, but the calcium level remains persistently elevated. The etiology of this is not clear, the patient does not list any calcium supplementation as part of her medications. If she is on calcium supplementation, she is to stop this. Otherwise, further workup for the hypercalcemia should be undertaken. I'll send blood work to the primary care physician.

## 2014-09-07 ENCOUNTER — Ambulatory Visit
Admission: RE | Admit: 2014-09-07 | Discharge: 2014-09-07 | Disposition: A | Discharge status: Home / Self Care | Payer: Medicaid Other | Source: Ambulatory Visit | Attending: Physician Assistant | Admitting: Physician Assistant

## 2014-09-07 DIAGNOSIS — Z1231 Encounter for screening mammogram for malignant neoplasm of breast: Secondary | ICD-10-CM

## 2014-09-19 ENCOUNTER — Other Ambulatory Visit: Payer: Medicaid Other | Admitting: Radiology

## 2014-09-20 ENCOUNTER — Encounter (INDEPENDENT_AMBULATORY_CARE_PROVIDER_SITE_OTHER): Payer: Self-pay

## 2014-09-20 ENCOUNTER — Telehealth: Payer: Self-pay | Admitting: Neurology

## 2014-09-20 ENCOUNTER — Ambulatory Visit (INDEPENDENT_AMBULATORY_CARE_PROVIDER_SITE_OTHER): Payer: Medicaid Other | Admitting: Radiology

## 2014-09-20 DIAGNOSIS — F250 Schizoaffective disorder, bipolar type: Secondary | ICD-10-CM

## 2014-09-20 DIAGNOSIS — R413 Other amnesia: Secondary | ICD-10-CM

## 2014-09-20 DIAGNOSIS — R410 Disorientation, unspecified: Secondary | ICD-10-CM

## 2014-09-20 DIAGNOSIS — F05 Delirium due to known physiological condition: Secondary | ICD-10-CM

## 2014-09-20 NOTE — Telephone Encounter (Signed)
I called patient. The EEG study was unremarkable. I will call him when I get the results of the MRI brain evaluation.

## 2014-09-20 NOTE — Procedures (Signed)
    History:  Elizabeth Thompson is a 63 year old patient with a history of schizoaffective disorder. The patient has had problems with hearing voices, and she has had some issues with memory. The patient has had episodes of confusion, and she is being evaluated for this.  This is a routine EEG. No skull defects are noted. Medications include albuterol inhaler, Abilify, Cogentin, Aricept, Amaryl, hydroxyzine, Lamictal, Xalatan, lisinopril, metformin, Remeron, multivitamins, and Zocor.   EEG classification: Normal awake  Description of the recording: The background rhythms of this recording consists of a fairly well modulated medium amplitude alpha rhythm of 9 Hz that is reactive to eye opening and closure. As the record progresses, the patient appears to remain in the waking state throughout the recording. Photic stimulation was performed, resulting in a bilateral and symmetric photic driving response. Hyperventilation was also performed, resulting in a minimal buildup of the background rhythm activities without significant slowing seen. At no time during the recording does there appear to be evidence of spike or spike wave discharges or evidence of focal slowing. EKG monitor shows no evidence of cardiac rhythm abnormalities with a heart rate of 90.  Impression: This is a normal EEG recording in the waking state. No evidence of ictal or interictal discharges are seen.

## 2014-09-21 ENCOUNTER — Ambulatory Visit
Admission: RE | Admit: 2014-09-21 | Discharge: 2014-09-21 | Disposition: A | Discharge status: Home / Self Care | Payer: Medicaid Other | Source: Ambulatory Visit | Attending: Neurology | Admitting: Neurology

## 2014-09-21 DIAGNOSIS — F05 Delirium due to known physiological condition: Secondary | ICD-10-CM

## 2014-09-21 DIAGNOSIS — R410 Disorientation, unspecified: Secondary | ICD-10-CM

## 2014-09-21 DIAGNOSIS — R413 Other amnesia: Secondary | ICD-10-CM

## 2014-09-21 DIAGNOSIS — F250 Schizoaffective disorder, bipolar type: Secondary | ICD-10-CM

## 2014-09-24 ENCOUNTER — Telehealth: Payer: Self-pay | Admitting: Neurology

## 2014-09-24 NOTE — Telephone Encounter (Addendum)
MRI the brain shows a mild asymmetry of the size of the lateral ventricles, this was noted on a prior CT scan of the brain in 2009. Very minimal white matter changes are noted. I called and talked with the caretaker. The patient has had an occasional fall at night going to the bathroom, may be related to medications, but if the walking issues worsens, they are to let you know.   MRI brain 09/24/2014:  Impression   Abnormal MRI brain (without) demonstrating: 1. Mild perisylvian atrophy.  2. Moderate enlargement of right greater than left lateral ventricles. 3. Mild scattered periventricular and subcortical gliosis.  4. No acute findings.

## 2014-09-27 ENCOUNTER — Ambulatory Visit (INDEPENDENT_AMBULATORY_CARE_PROVIDER_SITE_OTHER): Payer: Medicaid Other | Admitting: Internal Medicine

## 2014-09-27 ENCOUNTER — Encounter: Payer: Self-pay | Admitting: Internal Medicine

## 2014-09-27 LAB — CALCIUM, IONIZED: CALCIUM ION: 1.42 mmol/L — AB (ref 1.12–1.32)

## 2014-09-27 LAB — BASIC METABOLIC PANEL
BUN: 13 mg/dL (ref 6–23)
CALCIUM: 10.5 mg/dL (ref 8.4–10.5)
CHLORIDE: 103 meq/L (ref 96–112)
CO2: 26 mEq/L (ref 19–32)
Creatinine, Ser: 1 mg/dL (ref 0.4–1.2)
GFR: 72.6 mL/min (ref >60.00)
Glucose, Bld: 147 mg/dL — ABNORMAL HIGH (ref 70–99)
Potassium: 4.3 mEq/L (ref 3.5–5.1)
Sodium: 138 mEq/L (ref 135–145)

## 2014-09-27 NOTE — Patient Instructions (Signed)
Please stop at the lab.  Please make sure your diet contains 1000 mg calcium a day. You can restart 4000 units if vitamin D daily. Please collect urine over 24h: Patient information (Up-to-Date): Collection of a 24-hour urine specimen  - You should collect every drop of urine during each 24-hour period. It does not matter how much or little urine is passed each time, as long as every drop is collected. - Begin the urine collection in the morning after you wake up, after you have emptied your bladder for the first time. - Urinate (empty the bladder) for the first time and flush it down the toilet. Note the exact time (eg, 6:15 AM). You will begin the urine collection at this time. - Collect every drop of urine during the day and night in an empty collection bottle. Store the bottle at room temperature or in the refrigerator. - If you need to have a bowel movement, any urine passed with the bowel movement should be collected. Try not to include feces with the urine collection. If feces does get mixed in, do not try to remove the feces from the urine collection bottle. - Finish by collecting the first urine passed the next morning, adding it to the collection bottle. This should be within ten minutes before or after the time of the first morning void on the first day (which was flushed). In this example, you would try to void between 6:05 and 6:25 on the second day. - If you need to urinate one hour before the final collection time, drink a full glass of water so that you can void again at the appropriate time. If you have to urinate 20 minutes before, try to hold the urine until the proper time. - Please note the exact time of the final collection, even if it is not the same time as when collection began on day 1. - The bottle(s) may be kept at room temperature for a day or two, but should be kept cool or refrigerated for longer periods of time.  Please try to join MyChart for easier  communication. Please return for a visit in 4 months, but I will let you know about the lab results as soon as they return to me.  Hypercalcemia Hypercalcemia means the calcium in your blood is too high. Calcium in our blood is important for the control of many things, such as:  Blood clotting.  Conducting of nerve impulses.  Muscle contraction.  Maintaining teeth and bone health.  Other body functions. In the bloodstream, calcium maintains a constant balance with another mineral, phosphate. Calcium is absorbed into the body through the small intestine. This is helped by vitamin D. Calcium levels are maintained mostly by vitamin D and a hormone (parathyroid hormone). But the kidneys also help. Hypercalcemia can happen when the concentration of calcium is too high for the kidneys to maintain balance. The body maintains a balance between the calcium we eat and the calcium already in our body. If calcium intake is increased or we cannot use calcium properly, there may be problems. Some common sources of calcium are:   Dairy products.  Nuts.  Eggs.  Whole grains.  Legumes.  Green leafy vegetables. CAUSES There are many causes of this condition, but some common ones are:  Hyperparathyroidism. This is an overactivity of the parathyroid gland.  Cancers of the breast, kidney, lung, head, and neck are common causes of calcium increases.  Medications that cause you to urinate more often (diuretics),  nausea, vomiting, and diarrhea also increase the calcium in the blood.  Overuse of calcium-containing antacids. SYMPTOMS  Many patients with mild hypercalcemia have no symptoms. For those with symptoms, common problems include:  Loss of appetite.  Constipation.  Increased thirst.  Heart rhythm changes.  Abnormal thinking.  Nausea.  Abdominal pain.  Kidney stones.  Mood swings.  Coma and death when severe.  Vomiting.  Increased urination.  High blood  pressure.  Confusion. DIAGNOSIS   Your caregiver will do a medical history and perform a physical exam on you.  Calcium and parathyroid hormone (PTH) may be measured with a blood test. TREATMENT   The treatment depends on the calcium level and what is causing the higher level. Hypercalcemia can be life threatening. Fast lowering of the calcium level may be necessary.  With normal kidney function, fluids can be given by vein to clear the excess calcium. Hemodialysis works well to reduce dangerous calcium levels if there is poor kidney function. This is a procedure in which a machine is used to filter out unwanted substances. The blood is then returned to the body.  Drugs, such as diuretics, can be given after adequate fluid intake is established. These medications help the kidneys get rid of extra calcium. Drugs that lessen (inhibit) bone loss are helpful in gaining long-term control. Phosphate pills help lower high calcium levels caused by a low supply of phosphate. Anti-inflammatory agents such as steroids are helpful with some cancers and toxic levels of vitamin D.  Treatment of the underlying cause of the hypercalcemia will also correct the imbalance. Hyperparathyroidism is usually treated by surgical removal of one or more of the parathyroid glands and any tissue, other than the glands themselves, that is producing too much hormone.  The hypercalcemia caused by cancer is difficult to treat without controlling the cancer. Symptoms can be improved with fluids and drug therapy as outlined above. PROGNOSIS   Surgery to remove the parathyroid glands is usually successful. This also depends on the amount of damage to the kidneys and whether or not it can be treated.  Mild hypercalcemia can be controlled with good fluid intake and the use of effective medications.  Hypercalcemia often develops as a late complication of cancer. The expected outlook is poor without effective anticancer  therapy. PREVENTION   If you are at risk for developing hypercalcemia, be familiar with early symptoms. Report these to your caregiver.  Good fluid intake (up to four quarts of liquid a day if possible) is helpful.  Try to control nausea and vomiting, and treat fevers to avoid dehydration.  Lowering the amount of calcium in your diet is not necessary. High blood calcium reduces absorption of calcium in the intestine.  Stay as active as possible. SEEK IMMEDIATE MEDICAL CARE IF:   You develop chest pain, sweating, or shortness of breath.  You get confused, feel faint or pass out.  You develop severe nausea and vomiting. MAKE SURE YOU:   Understand these instructions.  Will watch your condition.  Will get help right away if you are not doing well or get worse. Document Released: 02/13/2005 Document Revised: 04/16/2014 Document Reviewed: 11/25/2010 [facility] Patient Information 2015 [place], [place]. This information is not intended to replace advice given to you by your health care provider. Make sure you discuss any questions you have with your health care provider.

## 2014-09-27 NOTE — Progress Notes (Addendum)
Patient ID: Elizabeth Thompson, female   DOB: Aug 08, 1951, 63 y.o.   MRN: [id number]   HPI  Elizabeth Thompson is a 63 y.o.-year-old female, referred by her PCP, Millsaps, Luane School, NP, in consultation for hypercalcemia. She is here with her niece, who is a Marine scientist, and offers most of the history.  Pt was dx with hypercalcemia in 01/2014. I reviewed pt's pertinent labs:  Lab Results  Component Value Date   CALCIUM 11.3* 09/05/2014   CALCIUM 11.2* 08/23/2014   CALCIUM 10.7* 08/16/2014   CALCIUM 10.7* 08/14/2014   CALCIUM 10.5 11/26/2009  09/06/2014: PTH 25, Ca 10.6, Mg 1.6, phosphorus 4.4 (2.5-4.5) 08/14/2014: Ca 11.3 (ULN 10.3), at the same time Na 127, Glu 81 03/06/2014: Ca 11.1  No fractures, despite few falls. No previous DEXA scans.  No h/o kidney stones.  No h/o CKD. Last BUN/Cr: Lab Results  Component Value Date   BUN 9 09/05/2014   CREATININE 0.87 09/05/2014   Pt is not on HCTZ. She had steroid hip injections - last one in 07/2014.  She has a h/o vitamin D deficiency. Last vit D level was 79.2 in 08/14/2014. Her vitamin D supplement (was taking 8000 IU daily) was stopped then.  Pt eats dairy and green, leafy, vegetables.  Pt does not have a FH of hypercalcemia, pituitary tumors, thyroid cancer, or osteoporosis. Her niece has Graves ds. Niece with sarcoidosis.  She had a normal mammogram recently. She has a colonoscopy coming up. She had a negative TB test.  I reviewed her chart and she also has a history of schizophrenia.  Last TSH was 0.940 on 08/14/2014. Last HbA1c was 6.6% on 08/14/2014. She has a h/o DM2.  Also, h/o HL, HTN.   ROS: Constitutional: no weight gain/loss,+ fatigue, no subjective hyperthermia/hypothermia, + poor sleep, + nocturia Eyes: no blurry vision, no xerophthalmia ENT: no sore throat, no nodules palpated in throat, no dysphagia/odynophagia, no hoarseness Cardiovascular: no CP/SOB/palpitations/leg swelling Respiratory: no cough/SOB Gastrointestinal:  no N/V/D/C Musculoskeletal: no muscle/joint aches Skin: no rashes Neurological: no tremors/numbness/tingling/dizziness Psychiatric: + depression/no anxiety  Past Medical History  Diagnosis Date  . Hypertension   . Bell's palsy   . Glaucoma   . Diabetes mellitus without complication   . Schizo-affective psychosis   . Memory disorder 09/05/2014  . Uterine fibroid    Past Surgical History  Procedure Laterality Date  . Abdominal hysterectomy    . Tonsillectomy and adenoidectomy    . Appendectomy     History   Social History  . Marital Status: Divorced    Spouse Name: N/A    Number of Children: 1  . Years of Education: college 4   Occupational History  . disabled    Social History Main Topics  . Smoking status: Current Every Day Smoker -- 1.00 packs/day for 28 years, now cigarettes a day    Types: Cigarettes  . Smokeless tobacco: Never Used  . Alcohol Use: No  . Drug Use: No   Current Outpatient Prescriptions on File Prior to Visit  Medication Sig Dispense Refill  . albuterol (PROVENTIL HFA;VENTOLIN HFA) 108 (90 BASE) MCG/ACT inhaler Inhale 2 puffs into the lungs every 6 (six) hours as needed for wheezing or shortness of breath.      . ARIPiprazole (ABILIFY) 20 MG tablet Take 1 tablet (20 mg total) by mouth at bedtime.  30 tablet  0  . benztropine (COGENTIN) 0.5 MG tablet Take 1 tablet (0.5 mg total) by mouth at bedtime.  30 tablet  0  . donepezil (ARICEPT) 5 MG tablet Take 1 tablet (5 mg total) by mouth at bedtime.  30 tablet  0  . glimepiride (AMARYL) 4 MG tablet Take 1 tablet (4 mg total) by mouth daily with breakfast.      . lamoTRIgine (LAMICTAL) 100 MG tablet Take 1 tablet (100 mg total) by mouth every evening.  30 tablet  0  . latanoprost (XALATAN) 0.005 % ophthalmic solution Place 1 drop into both eyes at bedtime.  2.5 mL  12  . lisinopril (PRINIVIL,ZESTRIL) 40 MG tablet Take 1 tablet (40 mg total) by mouth daily.      . metFORMIN (GLUCOPHAGE) 1000 MG tablet Take 1  tablet (1,000 mg total) by mouth 2 (two) times daily with a meal.      . mirtazapine (REMERON) 7.5 MG tablet Take 1 tablet (7.5 mg total) by mouth at bedtime.  30 tablet  0  . Multiple Vitamin (MULTIVITAMIN WITH MINERALS) TABS tablet Take 1 tablet by mouth daily.      . simvastatin (ZOCOR) 40 MG tablet Take 1 tablet (40 mg total) by mouth daily.  30 tablet     No current facility-administered medications on file prior to visit.   Allergies  Allergen Reactions  . Codeine Nausea And Vomiting  . Prednisone Other (See Comments)    Has to monitor because she is a diabetic    Family History  Problem Relation Age of Onset  . Dementia Mother   . Alzheimer's disease Mother   . Heart disease Mother   . Hypertension Mother   . Diabetes Mother   . Diabetes Father   . Alzheimer's disease Sister   . Heart disease Brother   . Heart attack Brother    PE: BP 128/88  Pulse 99  Temp(Src) 98 F (36.7 C) (Oral)  Resp 12  Ht _0  (1.626 m)  Wt 190 lb (86.183 kg)  BMI 32.60 kg/m2  SpO2 95% Wt Readings from Last 3 Encounters:  09/27/14 190 lb (86.183 kg)  09/05/14 189 lb (85.73 kg)  08/16/14 191 lb (86.637 kg)   Constitutional: overweight, in NAD. Mostly nonverbal. Eyes: PERRLA, EOMI, no exophthalmos ENT: moist mucous membranes, no thyromegaly, no cervical lymphadenopathy Cardiovascular: RRR, No MRG Respiratory: CTA B Gastrointestinal: abdomen soft, NT, ND, BS+ Musculoskeletal: no deformities, strength intact in all 4 Skin: moist, warm, no rashes Neurological: no tremor with outstretched hands, DTR normal in all 4  Assessment: 1. Hypercalcemia  Plan: Patient has had several instances of elevated calcium, with the highest level being at 11.3. At last check, Ca was 10.6 and a corresponding intact PTH level was appropriately suppressed, at 25. No vitamin D deficiency per last check last month. No apparent complications from hypercalcemia: no h/o nephrolithiasis, no osteoporosis, no  fractures. No abdominal pain, depression, bone pain. - I discussed with the patient and her nieceabout the physiology of calcium and parathyroid hormone, and possible side effects from increased PTH, including kidney stones, osteoporosis, abdominal pain, etc.  - we discussed that an appropriately suppressed PTH in the context of a high calcium makes primary hyperparathyroidism very unlikely, however, if she turns out to have this, we discussed about criteria for parathyroid surgery:  Increased calcium by more than 1 mg/dL above the upper limit of normal  Kidney ds.  Osteoporosis (or Vb fx) Age <47 years old New (2013): High UCa >400 mg/d and increased stone risk by biochemical stone risk analysis Presence of nephrolithiasis or nephrocalcinosis [name] preference!  -  she does not meet the criteria as of now, however I would like to check a DEXA scan to see if she has osteoporosis. I would add a 33% distal radius for evaluation of cortical bone.  - I will check today: calcium level intact PTH ([facility]) pTHrp Vitamin 1,25 HO  24h urinary calcium/creatinine ratio -  pt given instructions for urine collection and the jug - If the tests indicate a parathyroid adenoma, I will refer her to surgery. We discussed possible consequences of hyperparathyroidism: ~1/3 pts will develop complications over 15 years (OP, nephrolithiasis).   - I will see the patient back in 4 months   Component     Latest Ref Rng 10/01/2014  Calcium, Ur      <1  Calcium, 24 hour urine     100 - 250 mg/day Not calc. due to CaU <1  Creatinine, Urine      75.8  Creatinine, 24H Ur     700 - 1800 mg/day 1213   On repeat: Component     Latest Ref Rng 10/10/2014  Calcium, Ur     Not Estab. mg/dL <0.8  Calcium, 24H Urine     100.0 - 300.0 mg/24 hr <16.8 (L)  Creatinine, Ur     15.0 - 278.0 mg/dL 57.8  Creatinine, 24H Ur     800.0 - 1800.0 mg/24 hr 1213.8    Component     Latest Ref Rng 09/27/2014  Sodium     135 -  145 mEq/L 138  Potassium     3.5 - 5.1 mEq/L 4.3  Chloride     96 - 112 mEq/L 103  CO2     19 - 32 mEq/L 26  Glucose     70 - 99 mg/dL 147 (H)  BUN     6 - 23 mg/dL 13  Creatinine     0.4 - 1.2 mg/dL 1.0  Calcium     8.4 - 10.5 mg/dL 10.5  GFR     >60.00 mL/min 72.60  Vitamin D 1, 25 (OH) Total     18 - 72 pg/mL 38  Vitamin D3 1, 25 (OH)      38  Vitamin D2 1, 25 (OH)      <8  PTH     15 - 65 pg/mL 24  Calcium Ionized     1.12 - 1.32 mmol/L 1.42 (H)  PTH-Related Protein (PTH-RP)     14 - 27 pg/mL 13 (L)   iCa high, PTH low normal, PTHrp low, UCa undetectable (?). Vit D normal. Mg low normal. Phos high normal.  DEXA 10/17/2014:  Lumbar spine L1-L4 (L2) Femoral neck (FN) 33% L distal radius UD L radius  T-score 2.5 RFN: + 1.8 LFN: + 2.3 + 0.3 - 0.3  Normal BMD.  Tc sestamibi scan: normal: CLINICAL DATA: Evaluate for parathyroid adenoma.  EXAM: NM PARATHYROID SCINTIGRAPHY AND SPECT IMAGING  TECHNIQUE: Following intravenous administration of radiopharmaceutical, early and 2-hour delayed planar images were obtained in the anterior projection. Delayed triplanar SPECT images were also obtained at 2 hours.  RADIOPHARMACEUTICALS: 26.7 mCiTc-45mSestamibi IV  COMPARISON: None.  FINDINGS: On the early images there is uniform tracer uptake throughout both lobes of the thyroid gland. On the delayed images there is uniform washout from both lobes of the thyroid gland. No persistent focus of increased uptake to suggest parathyroid adenoma identified.  IMPRESSION: 1. No evidence for parathyroid adenoma.   Electronically Signed By: TKerby MoorsM.D. On: 11/22/2014 12:13  I doubt familial hypocalciuric hypercalcemia, as this is very rare, she does not have FH of the ds and her Mg is low-normal, not high.   Since the parathyroid scan can be falsely negative in some situations,  I would like to also check a neck ultrasound. I will order this  in [place] for her and they will call her with appt.  CLINICAL DATA: Hypercalcemia.  EXAM: THYROID ULTRASOUND  TECHNIQUE: Ultrasound examination of the thyroid gland and adjacent soft tissues was performed.  COMPARISON: None.  FINDINGS: Right thyroid lobe  Measurements: 4.1 x 1.7 x 1.6 cm. No nodules visualized.  Left thyroid lobe  Measurements: 4.6 x 2.3 x 1.7 cm. No nodules visualized.  Isthmus  Thickness: 0.6 cm. No nodules visualized.  Thyroid gland echotexture and size are within normal limits. No thyroid nodules are identified. No abnormal parathyroid tissue is seen by ultrasound.  Lymphadenopathy  None visualized.  IMPRESSION: Normal thyroid ultrasound.   Electronically Signed By: Aletta Edouard M.D. On: 11/26/2014 17:30  At this point, based on the labs and imaging tests above, I think we ruled out a parathyroid adenoma, and it is possible that patient has acquired hypocalciuric hypercalcemia. In this case, only follow-up is needed, but no intervention is necessary.

## 2014-09-28 LAB — PARATHYROID HORMONE, INTACT (NO CA): PTH: 24 pg/mL (ref 15–65)

## 2014-09-30 LAB — VITAMIN D 1,25 DIHYDROXY
VITAMIN D 1, 25 (OH) TOTAL: 38 pg/mL (ref 18–72)
Vitamin D3 1, 25 (OH)2: 38 pg/mL

## 2014-10-01 ENCOUNTER — Other Ambulatory Visit: Payer: Medicaid Other

## 2014-10-02 LAB — CALCIUM, URINE, 24 HOUR: Calcium, Ur: <1 mg/dL

## 2014-10-02 LAB — CREATININE, URINE, 24 HOUR
Creatinine, 24H Ur: 1213 mg/d (ref 700–1800)
Creatinine, Urine: 75.8 mg/dL

## 2014-10-06 LAB — PTH-RELATED PEPTIDE: PTH-RELATED PROTEIN (PTH-RP): 13 pg/mL — AB (ref 14–27)

## 2014-10-08 ENCOUNTER — Encounter: Payer: Self-pay | Admitting: *Deleted

## 2014-10-10 ENCOUNTER — Other Ambulatory Visit: Payer: Self-pay

## 2014-10-11 LAB — CALCIUM, URINE, 24 HOUR
Calcium, 24H Urine: <16.8 mg/24 hr — ABNORMAL LOW (ref 100.0–300.0)
Calcium, Ur: <0.8 mg/dL

## 2014-10-11 LAB — CREATININE, URINE, 24 HOUR
CREATININE 24H UR: 1213.8 mg/(24.h) (ref 800.0–1800.0)
CREATININE UR: 57.8 mg/dL (ref 15.0–278.0)

## 2014-10-16 ENCOUNTER — Ambulatory Visit (INDEPENDENT_AMBULATORY_CARE_PROVIDER_SITE_OTHER)
Admission: RE | Admit: 2014-10-16 | Discharge: 2014-10-16 | Disposition: A | Discharge status: Home / Self Care | Payer: Medicaid Other | Source: Ambulatory Visit | Attending: Internal Medicine | Admitting: Internal Medicine

## 2014-10-23 NOTE — Addendum Note (Signed)
Addended by: Philemon Kingdom on: 10/23/2014 09:22 AM   Modules accepted: Orders

## 2014-10-31 ENCOUNTER — Encounter: Payer: Self-pay | Admitting: Neurology

## 2014-11-02 ENCOUNTER — Telehealth: Payer: Self-pay | Admitting: Internal Medicine

## 2014-11-02 NOTE — Telephone Encounter (Signed)
Called and lvm advising her that the PCC's get that pre approved with the insurance first, then they will schedule and call her with the appt. Message sent to [facility], Ophelia Charter requesting her to look into this and schedule as soon as she could. Advised her of this as well.

## 2014-11-02 NOTE — Telephone Encounter (Signed)
Patients daughter called and would like to know if her thyroid scan had been scheduled    Please advise patient    Thank you

## 2014-11-06 ENCOUNTER — Encounter: Payer: Self-pay | Admitting: Neurology

## 2014-11-22 ENCOUNTER — Encounter (HOSPITAL_COMMUNITY)
Admission: RE | Admit: 2014-11-22 | Discharge: 2014-11-22 | Disposition: A | Discharge status: Home / Self Care | Payer: Medicaid Other | Source: Ambulatory Visit | Attending: Internal Medicine | Admitting: Internal Medicine

## 2014-11-22 MED ORDER — TECHNETIUM TC 99M SESTAMIBI - CARDIOLITE
26.7000 | Freq: Once | INTRAVENOUS | Status: AC | PRN
  Administered 2014-11-22: 27 via INTRAVENOUS

## 2014-11-23 NOTE — Addendum Note (Signed)
Addended by: Philemon Kingdom on: 11/23/2014 08:14 AM   Modules accepted: Orders

## 2014-11-26 ENCOUNTER — Ambulatory Visit
Admission: RE | Admit: 2014-11-26 | Discharge: 2014-11-26 | Disposition: A | Discharge status: Home / Self Care | Payer: Medicaid Other | Source: Ambulatory Visit | Attending: Internal Medicine | Admitting: Internal Medicine

## 2014-11-27 NOTE — Addendum Note (Signed)
Addended by: Philemon Kingdom on: 11/27/2014 12:31 PM   Modules accepted: Level of Service

## 2014-12-12 ENCOUNTER — Encounter: Payer: Self-pay | Admitting: Neurology

## 2014-12-12 ENCOUNTER — Ambulatory Visit (INDEPENDENT_AMBULATORY_CARE_PROVIDER_SITE_OTHER): Payer: Medicaid Other | Admitting: Neurology

## 2014-12-12 VITALS — BP 134/87 | HR 107 | Ht 63.0 in | Wt 192.4 lb

## 2014-12-12 DIAGNOSIS — R413 Other amnesia: Secondary | ICD-10-CM

## 2014-12-12 NOTE — Progress Notes (Signed)
Reason for visit: Confusion  Elizabeth Thompson is an 63 y.o. female  History of present illness:  Elizabeth Thompson is a 63 year old right-handed white female with a schizoaffective disorder, admitted to the hospital on 08/16/2014 with psychosis. The patient has had some confusion coming out of that hospitalization, but over time, the family indicates that she has gotten back to her usual baseline. MRI evaluation of the brain did not show acute abnormalities, and an EEG study was unremarkable. Blood work was unremarkable with exception that she appears to have chronic hypercalcemia. A workup for the hypercalcemia apparently was unrevealing. The patient is apparently doing quite well at this time. She returns for an evaluation.  Past Medical History  Diagnosis Date  . Hypertension   . Bell's palsy   . Glaucoma   . Diabetes mellitus without complication   . Schizo-affective psychosis   . Memory disorder 09/05/2014  . Uterine fibroid   . Hypercalcemia     Past Surgical History  Procedure Laterality Date  . Abdominal hysterectomy    . Tonsillectomy and adenoidectomy    . Appendectomy      Family History  Problem Relation Age of Onset  . Dementia Mother   . Alzheimer's disease Mother   . Heart disease Mother   . Hypertension Mother   . Diabetes Mother   . Diabetes Father   . Alzheimer's disease Sister   . Heart disease Brother   . Heart attack Brother     Social history:  reports that she has been smoking Cigarettes.  She has a 28 pack-year smoking history. She has never used smokeless tobacco. She reports that she does not drink alcohol or use illicit drugs.    Allergies  Allergen Reactions  . Codeine Nausea And Vomiting  . Prednisone Other (See Comments)    Has to monitor because she is a diabetic     Medications:  Current Outpatient Prescriptions on File Prior to Visit  Medication Sig Dispense Refill  . albuterol (PROVENTIL HFA;VENTOLIN HFA) 108 (90 BASE) MCG/ACT  inhaler Inhale 2 puffs into the lungs every 6 (six) hours as needed for wheezing or shortness of breath.    . ARIPiprazole (ABILIFY) 20 MG tablet Take 1 tablet (20 mg total) by mouth at bedtime. 30 tablet 0  . benztropine (COGENTIN) 0.5 MG tablet Take 1 tablet (0.5 mg total) by mouth at bedtime. 30 tablet 0  . donepezil (ARICEPT) 5 MG tablet Take 1 tablet (5 mg total) by mouth at bedtime. 30 tablet 0  . glimepiride (AMARYL) 4 MG tablet Take 1 tablet (4 mg total) by mouth daily with breakfast.    . hydrOXYzine (ATARAX/VISTARIL) 25 MG tablet Take 25 mg by mouth as needed for anxiety. 2 at bedtime    . lamoTRIgine (LAMICTAL) 100 MG tablet Take 1 tablet (100 mg total) by mouth every evening. 30 tablet 0  . latanoprost (XALATAN) 0.005 % ophthalmic solution Place 1 drop into both eyes at bedtime. 2.5 mL 12  . lisinopril (PRINIVIL,ZESTRIL) 40 MG tablet Take 1 tablet (40 mg total) by mouth daily.    . metFORMIN (GLUCOPHAGE) 1000 MG tablet Take 1 tablet (1,000 mg total) by mouth 2 (two) times daily with a meal.    . mirtazapine (REMERON) 7.5 MG tablet Take 1 tablet (7.5 mg total) by mouth at bedtime. 30 tablet 0  . Multiple Vitamin (MULTIVITAMIN WITH MINERALS) TABS tablet Take 1 tablet by mouth daily.    . simvastatin (ZOCOR) 40 MG  tablet Take 1 tablet (40 mg total) by mouth daily. 30 tablet    No current facility-administered medications on file prior to visit.    ROS:  Out of a complete 14 system review of symptoms, the patient complains only of the following symptoms, and all other reviewed systems are negative.  Excessive sweating Facial drooping   Blood pressure 134/87, pulse 107, height 5' 3" (1.6 m), weight 192 lb 6.4 oz (87.272 kg).  Physical Exam  General: The patient is alert and cooperative at the time of the examination.  The patient is markedly obese.  Skin: No significant peripheral edema is noted.   Neurologic Exam  Mental status: The Mini-Mental Status Examination done  today shows a total score 27/30.  Cranial nerves: Facial symmetry is present. Speech is normal, no aphasia or dysarthria is noted. Extraocular movements are full. Visual fields are full.  Motor: The patient has good strength in all 4 extremities.  Sensory examination: Soft touch sensation is symmetric on the face, arms, and legs.  Coordination: The patient has good finger-nose-finger and heel-to-shin bilaterally.  Gait and station: The patient has a normal gait. Tandem gait is unsteady.. Romberg is negative. No drift is seen.  Reflexes: Deep tendon reflexes are symmetric, but are depressed.    MRI brain 09/24/2014:  IMPRESSION:  Abnormal MRI brain (without) demonstrating: 1. Mild perisylvian atrophy.  2. Moderate enlargement of right greater than left lateral ventricles. 3. Mild scattered periventricular and subcortical gliosis.  4. No acute findings.    Assessment/Plan:  1. Schizoaffective disorder  2. Transient confusion  3. Low-grade hypercalcemia  The patient is at or near her baseline currently. The workup was unrevealing for the etiology of the confusion. This was apparently was associated with the depressive psychosis, associated with some hyponatremia. At this point, the patient will follow-up through this office if needed.  Jill Alexanders MD 12/12/2014 7:15 PM  [facility] [address] [place],  [address]  Phone [phone] Fax [phone]

## 2014-12-12 NOTE — Patient Instructions (Signed)
Confusion Confusion is the inability to think with your usual speed or clarity. Confusion may come on quickly or slowly over time. How quickly the confusion comes on depends on the cause. Confusion can be due to any number of causes. CAUSES   Concussion, head injury, or head trauma.  Seizures.  Stroke.  Fever.  Brain tumor.  Age related decreased brain function (dementia).  Heightened emotional states like rage or terror.  Mental illness in which the person loses the ability to determine what is real and what is not (hallucinations).  Infections such as a urinary tract infection (UTI).  Toxic effects from alcohol, drugs, or prescription medicines.  Dehydration and an imbalance of salts in the body (electrolytes).  Lack of sleep.  Low blood sugar (diabetes).  Low levels of oxygen from conditions such as chronic lung disorders.  Drug interactions or other medicine side effects.  Nutritional deficiencies, especially niacin, thiamine, vitamin C, or vitamin B.  Sudden drop in body temperature (hypothermia).  Change in routine, such as when traveling or hospitalized. SIGNS AND SYMPTOMS  People often describe their thinking as cloudy or unclear when they are confused. Confusion can also include feeling disoriented. That means you are unaware of where or who you are. You may also not know what the date or time is. If confused, you may also have difficulty paying attention, remembering, and making decisions. Some people also act aggressively when they are confused.  DIAGNOSIS  The medical evaluation of confusion may include:  Blood and urine tests.  X-rays.  Brain and nervous system tests.  Analyzing your brain waves (electroencephalogram or EEG).  Magnetic resonance imaging (MRI) of your head.  Computed tomography (CT) scan of your head.  Mental status tests in which your health care provider may ask many questions. Some of these questions may seem silly or strange,  but they are a very important test to help diagnose and treat confusion. TREATMENT  An admission to the hospital may not be needed, but a person with confusion should not be left alone. Stay with a family member or friend until the confusion clears. Avoid alcohol, pain relievers, or sedative drugs until you have fully recovered. Do not drive until directed by your health care provider. HOME CARE INSTRUCTIONS  What family and friends can do:  To find out if someone is confused, ask the person to state his or her name, age, and the date. If the person is unsure or answers incorrectly, he or she is confused.  Always introduce yourself, no matter how well the person knows you.  Often remind the person of his or her location.  Place a calendar and clock near the confused person.  Help the person with his or her medicines. You may want to use a pill box, an alarm as a reminder, or give the person each dose as prescribed.  Talk about current events and plans for the day.  Try to keep the environment calm, quiet, and peaceful.  Make sure the person keeps follow-up visits with his or her health care provider. PREVENTION  Ways to prevent confusion:  Avoid alcohol.  Eat a balanced diet.  Get enough sleep.  Take medicine only as directed by your health care provider.  Do not become isolated. Spend time with other people and make plans for your days.  Keep careful watch on your blood sugar levels if you are diabetic. SEEK IMMEDIATE MEDICAL CARE IF:   You develop severe headaches, repeated vomiting, seizures, blackouts, or  slurred speech.  There is increasing confusion, weakness, numbness, restlessness, or personality changes.  You develop a loss of balance, have marked dizziness, feel uncoordinated, or fall.  You have delusions, hallucinations, or develop severe anxiety.  Your family members think you need to be rechecked. Document Released: 01/07/2005 Document Revised: 04/16/2014  Document Reviewed: 01/05/2014 [facility] Patient Information 2015 [place], [place]. This information is not intended to replace advice given to you by your health care provider. Make sure you discuss any questions you have with your health care provider.

## 2015-01-31 ENCOUNTER — Encounter: Payer: Self-pay | Admitting: Internal Medicine

## 2015-01-31 ENCOUNTER — Ambulatory Visit: Payer: Self-pay | Admitting: Internal Medicine

## 2015-01-31 ENCOUNTER — Ambulatory Visit (INDEPENDENT_AMBULATORY_CARE_PROVIDER_SITE_OTHER): Payer: Medicaid Other | Admitting: Internal Medicine

## 2015-01-31 NOTE — Progress Notes (Signed)
Patient ID: Elizabeth Thompson, female   DOB: Feb 15, 1951, 64 y.o.   MRN: [id number]   HPI  Elizabeth Thompson is a 64 y.o.-year-old female, referred by her PCP, [place], Luane School, NP, in consultation for hypercalcemia. She is here with her niece, who is a Marine scientist, and offers most of the history. Last visit 4 mo ago.  She is getting steroid inj in back, she is in a wheelchair now.   Pt was dx with hypercalcemia in 01/2014. I reviewed pt's pertinent hx:  Lab Results  Component Value Date   PTH 24 09/27/2014   CALCIUM 10.5 09/27/2014   CALCIUM 11.3* 09/05/2014   CALCIUM 11.2* 08/23/2014   CALCIUM 10.7* 08/16/2014   CALCIUM 10.7* 08/14/2014   CALCIUM 10.5 11/26/2009  09/06/2014: PTH 25, Ca 10.6, Mg 1.6, phosphorus 4.4 (2.5-4.5) 08/14/2014: Ca 11.3 (ULN 10.3), at the same time Na 127, Glu 81 03/06/2014: Ca 11.1  No fractures, despite few falls. No h/o kidney stones.  No h/o CKD. Last BUN/Cr: Lab Results  Component Value Date   BUN 13 09/27/2014   CREATININE 1.0 09/27/2014   Pt is not on HCTZ. She had steroid hip injections - last one in 07/2014.  She has a h/o vitamin D deficiency. Last vit D level was 79.2 in 08/14/2014. Her vitamin D supplement (was taking 8000 IU daily) was stopped then. Pt eats dairy and green, leafy, vegetables.  Pt does not have a FH of hypercalcemia, pituitary tumors, thyroid cancer, or osteoporosis. Her niece has Graves ds. Niece with sarcoidosis.  She had a normal mammogram. She had a colonoscopy >> no malignancy. She had a negative TB test.  She also has a history of schizophrenia.   Last TSH was 0.940 on 08/14/2014. Last HbA1c was 6.6% on 08/14/2014. She has a h/o DM2.  Also, h/o HL, HTN.   At last visit, we performed extensive investigation for her hypercalcemia including 24 hour urine collection for calcium x2 (both returned with undetectable calcium, despite correct collection), normal bone mineral density scan, parathyroid technetium sestamibi  scan (not showing an adenoma), and neck ultrasound (that returned without any masses).  Component     Latest Ref Rng 10/01/2014  Calcium, Ur      <1  Calcium, 24 hour urine     100 - 250 mg/day Not calc. due to CaU <1  Creatinine, Urine      75.8  Creatinine, 24H Ur     700 - 1800 mg/day 1213   On repeat: Component     Latest Ref Rng 10/10/2014  Calcium, Ur     Not Estab. mg/dL <0.8  Calcium, 24H Urine     100.0 - 300.0 mg/24 hr <16.8 (L)  Creatinine, Ur     15.0 - 278.0 mg/dL 57.8  Creatinine, 24H Ur     800.0 - 1800.0 mg/24 hr 1213.8   A PTH level was low- normal in the setting of hypercalcemia, PTH RP was low, 1,25 hydroxy vitamin D was normal, Mg low normal. Phos high normal: Component     Latest Ref Rng 09/27/2014  Sodium     135 - 145 mEq/L 138  Potassium     3.5 - 5.1 mEq/L 4.3  Chloride     96 - 112 mEq/L 103  CO2     19 - 32 mEq/L 26  Glucose     70 - 99 mg/dL 147 (H)  BUN     6 - 23 mg/dL 13  Creatinine  0.4 - 1.2 mg/dL 1.0  Calcium     8.4 - 10.5 mg/dL 10.5  GFR     >60.00 mL/min 72.60  Vitamin D 1, 25 (OH) Total     18 - 72 pg/mL 38  Vitamin D3 1, 25 (OH)      38  Vitamin D2 1, 25 (OH)      <8  PTH     15 - 65 pg/mL 24  Calcium Ionized     1.12 - 1.32 mmol/L 1.42 (H)  PTH-Related Protein (PTH-RP)     14 - 27 pg/mL 13 (L)   A bone mineral density was normal: DEXA 10/17/2014:  Lumbar spine L1-L4 (L2) Femoral neck (FN) 33% L distal radius UD L radius  T-score 2.5 RFN: + 1.8 LFN: + 2.3 + 0.3 - 0.3  Normal BMD.  11/22/2014: Tc sestamibi scan: normal: On the early images there is uniform tracer uptake throughout both lobes of the thyroid gland. On the delayed images there is uniform washout from both lobes of the thyroid gland. No persistent focus of increased uptake to suggest parathyroid adenoma identified.  11/26/2014: Thyroid ultrasound: Thyroid gland echotexture and size are within normal limits. No thyroid nodules are identified.  No abnormal parathyroid tissue is seen by ultrasound.  From the investigation above >> I doubt familial hypocalciuric hypercalcemia, as this is very rare, she does not have FH of the ds and her Mg is low-normal, not high. At this point, based on the labs and imaging tests above, I think we ruled out a parathyroid adenoma, and it is possible that patient has acquired hypocalciuric hypercalcemia. In this case, only follow-up is needed, but no intervention is necessary.   ROS: Constitutional: + weight gain/,+ fatigue, no subjective hyperthermia/hypothermia, + poor sleep, + nocturia Eyes: no blurry vision, no xerophthalmia ENT: no sore throat, no nodules palpated in throat, no dysphagia/odynophagia, no hoarseness Cardiovascular: no CP/SOB/palpitations/leg swelling Respiratory: no cough/SOB Gastrointestinal: no N/V/D/C Musculoskeletal: ++ muscle pain /++ back aches Skin: no rashes Neurological: no tremors/numbness/tingling/dizziness  I reviewed pt's medications, allergies, PMH, social hx, family hx, and changes were documented in the history of present illness. Otherwise, unchanged from my initial visit note:  Past Medical History  Diagnosis Date  . Hypertension   . Bell's palsy   . Glaucoma   . Diabetes mellitus without complication   . Schizo-affective psychosis   . Memory disorder 09/05/2014  . Uterine fibroid   . Hypercalcemia    Past Surgical History  Procedure Laterality Date  . Abdominal hysterectomy    . Tonsillectomy and adenoidectomy    . Appendectomy     History   Social History  . Marital Status: Divorced    Spouse Name: N/A    Number of Children: 1  . Years of Education: college 4   Occupational History  . disabled    Social History Main Topics  . Smoking status: Current Every Day Smoker -- 1.00 packs/day for 28 years, now cigarettes a day    Types: Cigarettes  . Smokeless tobacco: Never Used  . Alcohol Use: No  . Drug Use: No   Current Outpatient  Prescriptions on File Prior to Visit  Medication Sig Dispense Refill  . albuterol (PROVENTIL HFA;VENTOLIN HFA) 108 (90 BASE) MCG/ACT inhaler Inhale 2 puffs into the lungs every 6 (six) hours as needed for wheezing or shortness of breath.    . ARIPiprazole (ABILIFY) 20 MG tablet Take 1 tablet (20 mg total) by mouth at bedtime. [place]  tablet 0  . benztropine (COGENTIN) 0.5 MG tablet Take 1 tablet (0.5 mg total) by mouth at bedtime. 30 tablet 0  . donepezil (ARICEPT) 5 MG tablet Take 1 tablet (5 mg total) by mouth at bedtime. 30 tablet 0  . glimepiride (AMARYL) 4 MG tablet Take 1 tablet (4 mg total) by mouth daily with breakfast.    . hydrOXYzine (ATARAX/VISTARIL) 25 MG tablet Take 25 mg by mouth as needed for anxiety. 2 at bedtime    . lamoTRIgine (LAMICTAL) 100 MG tablet Take 1 tablet (100 mg total) by mouth every evening. 30 tablet 0  . latanoprost (XALATAN) 0.005 % ophthalmic solution Place 1 drop into both eyes at bedtime. 2.5 mL 12  . lisinopril (PRINIVIL,ZESTRIL) 40 MG tablet Take 1 tablet (40 mg total) by mouth daily.    . metFORMIN (GLUCOPHAGE) 1000 MG tablet Take 1 tablet (1,000 mg total) by mouth 2 (two) times daily with a meal.    . mirtazapine (REMERON) 7.5 MG tablet Take 1 tablet (7.5 mg total) by mouth at bedtime. 30 tablet 0  . Multiple Vitamin (MULTIVITAMIN WITH MINERALS) TABS tablet Take 1 tablet by mouth daily.    . simvastatin (ZOCOR) 40 MG tablet Take 1 tablet (40 mg total) by mouth daily. 30 tablet    No current facility-administered medications on file prior to visit.   Allergies  Allergen Reactions  . Codeine Nausea And Vomiting  . Prednisone Other (See Comments)    Has to monitor because she is a diabetic    Family History  Problem Relation Age of Onset  . Dementia Mother   . Alzheimer's disease Mother   . Heart disease Mother   . Hypertension Mother   . Diabetes Mother   . Diabetes Father   . Alzheimer's disease Sister   . Heart disease Brother   . Heart attack  Brother    PE: BP 122/76 mmHg  Pulse 111  Temp(Src) 98.9 F (37.2 C) (Oral)  Resp 12  Wt 204 lb (92.534 kg)  SpO2 99% Wt Readings from Last 3 Encounters:  01/31/15 204 lb (92.534 kg)  12/12/14 192 lb 6.4 oz (87.272 kg)  09/27/14 190 lb (86.183 kg)   Constitutional: overweight, in NAD. Mostly nonverbal. In wheelchair.  Eyes: PERRLA, EOMI, no exophthalmos ENT: moist mucous membranes, no thyromegaly, no cervical lymphadenopathy Cardiovascular: RRR, No MRG Respiratory: CTA B Gastrointestinal: abdomen soft, NT, ND, BS+ Musculoskeletal: no deformities, strength intact in all 4 Skin: moist, warm, no rashes Neurological: no tremor with outstretched hands, DTR normal in all 4  Assessment: 1. Acquired hypocalciuric hypercalcemia - most likely dx  Plan: Patient has had several instances of elevated calcium, with the highest level being at 11.3. Her intact PTH level was appropriately suppressed, at 25. No vitamin D deficiency, no Mg and Phos abnormalities. She has a normal BMD and no parathyroid adenoma on Technetium sestamibi scan and on neck U/S. Since serum calcium is high and urine calcium is low >> she may have acquired hypocalciuric hypercalcemia. In this case, only follow-up is needed, but no intervention is necessary.  - I reviewed all her lab and imaging test with pt and niece and explained the dx and followup - No apparent complications from hypercalcemia: no h/o nephrolithiasis, no osteoporosis, no fractures. No abdominal pain, depression, bone pain. - I will obtain the calcium level from last week from PCP - Return in about 6 months (around 08/01/2015).  Labs from PCP: - Ca 10.8 (8.7-10.3) - Mg 1.9 -  phos 4.7 (2.5-4.5) - will check PTH and vit D at next visit  - time spent with the patient: 40 min, of which >50% was spent in reviewing together with the patient and her niece all the results that we obtained after last visit and discussing the diagnosis and follow-up/treatment.  They had a number of questions which I addressed.

## 2015-01-31 NOTE — Patient Instructions (Signed)
Please return in 6 months.

## 2015-05-07 ENCOUNTER — Ambulatory Visit: Payer: Medicaid Other | Attending: Physical Medicine and Rehabilitation | Admitting: Physical Therapy

## 2015-05-07 DIAGNOSIS — M5442 Lumbago with sciatica, left side: Secondary | ICD-10-CM | POA: Diagnosis not present

## 2015-05-07 DIAGNOSIS — M5441 Lumbago with sciatica, right side: Secondary | ICD-10-CM | POA: Diagnosis not present

## 2015-05-07 DIAGNOSIS — M5136 Other intervertebral disc degeneration, lumbar region: Secondary | ICD-10-CM | POA: Insufficient documentation

## 2015-05-07 NOTE — Patient Instructions (Signed)
1. Rolling walker  2. Wheelchair for community ambulation  3. TENS unit for pain control   4.  9 in x24 in [place]-Gel therapy wrap  5. Seated exercises: floor bike, leg exercises  6. [employer] Nu-Step or recumbent bike  ANKLE: Pumps  Point toes down, then up. _15__ reps per set, _2__ sets per day, __7_ days per week  Long Arc Quad  Straighten operated leg and try to hold it _3_ seconds. Use ____ lbs on ankle. Repeat ____15 times. Do ___2_ sessions a day.  FLEXION: Sitting (Active)  Sit, both feet flat. Lift right knee toward ceiling. Use ___ lbs. Complete _1__ sets of __15_ repetitions. Perform __1_ sessions per day.  Abduction / Adduction  Feet hip width apart, spread thighs out, then bring thighs together. Repeat _15__ times each direction. Do __2_ sessions per day. Do with ___yellow___ colored band around thighs. Note: If possible, place feet on floor.  Copyright  VHI. All rights reserved.

## 2015-05-07 NOTE — Therapy (Signed)
[place], [place], [address] Phone: [phone]   Fax:  [phone]  Physical Therapy Evaluation  Patient Details  Name: JAYLI FOGLEMAN MRN: [id number] Date of Birth: [birth year] Referring Provider:  Suella Broad, MD  Encounter Date: 05/07/2015      PT End of Session - 05/07/15 1835    Visit Number 1   Number of Visits 1   Authorization Type Medicaid 1x/visit   PT Start Time 1282   PT Stop Time 0813   PT Time Calculation (min) 63 min   Activity Tolerance Patient limited by pain      Past Medical History  Diagnosis Date  . Hypertension   . Bell's palsy   . Glaucoma   . Diabetes mellitus without complication   . Schizo-affective psychosis   . Memory disorder 09/05/2014  . Uterine fibroid   . Hypercalcemia     Past Surgical History  Procedure Laterality Date  . Abdominal hysterectomy    . Tonsillectomy and adenoidectomy    . Appendectomy      There were no vitals filed for this visit.  Visit Diagnosis:  Bilateral low back pain with left-sided sciatica - Plan: PT plan of care cert/re-cert  Right-sided low back pain with right-sided sciatica - Plan: PT plan of care cert/re-cert  Degenerative lumbar disc - Plan: PT plan of care cert/re-cert      Subjective Assessment - 05/07/15 1655    Subjective Long history of LBP, in February exacerbated back and right hip pain for no reason.  Injection in hip helped.  Had a steroid injection in back which didn't help in March.  Referred to PT prior to getting MRI.  Muscle spasm interfere with walking.     Pertinent History has shower bench;  using mother's wheelchair and QC   Limitations Walking;House hold activities;Standing   How long can you stand comfortably? 3 min   How long can you walk comfortably? 10 feet with QC   Diagnostic tests x-ray; MRI months ago   Patient Stated Goals learn how I can get around better   Currently in Pain? Yes   Pain Score 10-Worst pain ever   Pain Location Back  tailbone   Pain Orientation Mid   Pain Descriptors / Indicators Spasm   Pain Type Intractable pain   Pain Onset More than a month ago   Pain Frequency Intermittent   Pain Relieving Factors hot water; sitting; lying on back with legs elevated            [facility] PT Assessment - 05/07/15 1703    Assessment   Medical Diagnosis bilateral low back pain with right sided sciatica   Onset Date/Surgical Date --  February   Hand Dominance Right   Next MD Visit Dr. Nelva Bush after PT   Prior Therapy 2-3 years ago   Precautions   Precautions None   Restrictions   Weight Bearing Restrictions No   Balance Screen   Has the patient fallen in the past 6 months Yes   How many times? 5   Has the patient had a decrease in activity level because of a fear of falling?  Yes   Is the patient reluctant to leave their home because of a fear of falling?  Yes   [place] residence   Living Arrangements Other relatives   Home Access Stairs to enter   Additional Comments Uses BSC   Prior Function  Level of [place] Needs assistance with ADLs   Vocation Unemployed   Observation/Other Assessments   Focus on Therapeutic Outcomes (FOTO)  Medicaid 1x visit   Posture/Postural Control   Posture/Postural Control --  stands forward bent at hips; painful; leans heavily on QC   ROM / Strength   AROM / PROM / Strength AROM;Strength   AROM   Overall AROM Comments Lumbar not formally tested secondary to 10/10 pain in low back   AROM Assessment Site Hip;Knee;Ankle   Right/Left Hip Right;Left   Right Hip Extension 0   Right Hip Flexion 90   Left Hip Extension 0   Left Hip Flexion 90   Right/Left Knee Right;Left   Right Knee Extension 0   Right Knee Flexion 110   Left Knee Extension 0   Left Knee Flexion 110   Right/Left Ankle Right;Left   Right Ankle Dorsiflexion 0   Left Ankle Dorsiflexion 0   Strength   Strength  Assessment Site Hip;Knee;Ankle   Right/Left Hip Right;Left   Right Hip Flexion 4-/5   Right Hip ABduction 3+/5   Left Hip Flexion 4-/5   Left Hip ABduction 3+/5   Right/Left Knee Right;Left   Right Knee Flexion 4-/5   Right Knee Extension 3+/5   Left Knee Flexion 4-/5   Left Knee Extension 3+/5   Right/Left Ankle Right;Left   Right Ankle Dorsiflexion 3+/5   Right Ankle Inversion 3+/5   Right Ankle Eversion 3+/5   Left Ankle Dorsiflexion 3+/5   Left Ankle Inversion 3+/5   Left Ankle Eversion 3+/5                   [facility] Adult PT Treatment/Exercise - 05/07/15 1703    Moist Heat Therapy   Number Minutes Moist Heat 12 Minutes   Moist Heat Location Lumbar Spine   Electrical Stimulation   Electrical Stimulation Location Lumbar seated   Electrical Stimulation Action IFC   Electrical Stimulation Parameters 6 ma   Electrical Stimulation Goals Pain                PT Education - 05/07/15 1835    Education provided Yes   Education Details DME recommendations, seated chair ex; appropriate [employer] equipment   Person(s) Educated Patient;Caregiver(s)   Methods Explanation;Demonstration;Handout   Comprehension Verbalized understanding;Returned demonstration                    Plan - 05/07/15 1836    Clinical Impression Statement The patient is a pleasant 64 year old with a chronic history of LBP which severely worsened in February for no apparent reason.  She is here with her niece who also lives with her and is a caregiver to Ms. Llewellyn.  The patient is unable to ambulate more than 10 feet secondary to 10/10 back pain.  She arrives in her late mother's wheelchair pushed by her niece .  In the treatment room she leans heavily with 2 hands on a QC.  The patient reports she has fallen numerous times at home.  The patient would benefit from a rolling walker for household use and possibly a motorized chair for community mobility.  She would also benefit from a home  TENS unit for pain control of her back and got relief from a trial of this today in the clinic.  She has LE weakness as a result of limited ambulation since February.  She was instructed in seated LE strengthening since other positions are just too painful on  her back.   Unfortunately, due to changes in the [facility] Policy for Rehab as of June 2014, this patient does not have a qualifying diagnosis that is covered.  The patient is unable to pay out of pocket expenses at this time, therefore she will not be seen for treatment.  She needs the above mentioned durable medical equipment for safety and mobility as well as pain management.     Recommended Other Services Rolling walker;  power chair; TENS unit         Problem List Patient Active Problem List   Diagnosis Date Noted  . Hypercalcemia 09/27/2014  . Memory disorder 09/05/2014  . Schizoaffective disorder 08/18/2014  . Psychotic disorder 08/16/2014    Alvera Singh 05/07/2015, 6:49 PM  [facility] [address] [place], [place], [address] Phone: [phone]   Fax:  [phone] Ruben Im, PT 05/07/2015 6:50 PM Phone: [phone] Fax: [phone]

## 2015-05-09 ENCOUNTER — Ambulatory Visit (INDEPENDENT_AMBULATORY_CARE_PROVIDER_SITE_OTHER): Payer: Medicaid Other | Admitting: Neurology

## 2015-05-09 VITALS — BP 133/81 | HR 109 | Ht 63.0 in | Wt 199.4 lb

## 2015-05-09 DIAGNOSIS — R413 Other amnesia: Secondary | ICD-10-CM

## 2015-05-09 DIAGNOSIS — M545 Low back pain, unspecified: Secondary | ICD-10-CM | POA: Insufficient documentation

## 2015-05-09 DIAGNOSIS — G8929 Other chronic pain: Secondary | ICD-10-CM | POA: Diagnosis not present

## 2015-05-09 DIAGNOSIS — R269 Unspecified abnormalities of gait and mobility: Secondary | ICD-10-CM | POA: Diagnosis not present

## 2015-05-09 HISTORY — DX: Low back pain, unspecified: M54.50

## 2015-05-09 MED ORDER — METHOCARBAMOL 500 MG PO TABS: 500.0000 mg | ORAL_TABLET | Freq: Three times a day (TID) | ORAL | Status: DC

## 2015-05-09 NOTE — Progress Notes (Signed)
Reason for visit:  Low back pain  Referring physician:  Dr. Rosiland Oz is a 64 y.o. female  History of present illness:   Elizabeth Thompson is a 64 year old right-handed black female with a history of chronic low back pain. The patient has been seen by Dr. Rolena Infante previously, and Dr. Nelva Bush has been following her in a pain center for her back pain. The patient has received epidural steroid injections in the past. She has had an exacerbation her back pain since September 2015, but the pain is getting worse in March 2016. The patient has gotten an epidural steroid injection in March , but this did not offer much benefit. She was given a ten-day trial on Flexeril. She feels spasms in her back and upper thighs, with pain down to the knees, and some sciatica pain on the right. She finds that she is not in much pain with sitting, but with standing, the pain becomes significant. Both legs are affected. The patient is not able to ambulate well because of the pain. She uses a quad cane when she walks. The patient denies any issues controlling the bowels or the bladder. She has no definite numbness of the legs, but she feels weak in both legs. She denies any issues with neck pain or pain down either arm. She has had several falls. She comes to this office for an evaluation.    Past Medical History  Diagnosis Date  . Hypertension   . Bell's palsy   . Glaucoma   . Diabetes mellitus without complication   . Schizo-affective psychosis   . Memory disorder 09/05/2014  . Uterine fibroid   . Hypercalcemia   . Hyperlipidemia   . Bipolar disorder   . Back pain   . Chronic low back pain 05/09/2015    Past Surgical History  Procedure Laterality Date  . Abdominal hysterectomy    . Tonsillectomy and adenoidectomy    . Appendectomy      Family History  Problem Relation Age of Onset  . Dementia Mother   . Alzheimer's disease Mother   . Heart disease Mother   . Hypertension Mother   .  Diabetes Mother   . Diabetes Father   . Alzheimer's disease Sister   . Heart disease Brother   . Heart attack Brother     Social history:  reports that she has been smoking Cigarettes.  She has a 14 pack-year smoking history. She has never used smokeless tobacco. She reports that she does not drink alcohol or use illicit drugs.  Medications:  Prior to Admission medications   Medication Sig Start Date End Date Taking? Authorizing Provider  albuterol (PROVENTIL HFA;VENTOLIN HFA) 108 (90 BASE) MCG/ACT inhaler Inhale 2 puffs into the lungs every 6 (six) hours as needed for wheezing or shortness of breath. 08/27/14  Yes Kerrie Buffalo, NP  ARIPiprazole (ABILIFY) 20 MG tablet Take 1 tablet (20 mg total) by mouth at bedtime. 08/27/14  Yes Kerrie Buffalo, NP  benztropine (COGENTIN) 0.5 MG tablet Take 1 tablet (0.5 mg total) by mouth at bedtime. 08/27/14  Yes Kerrie Buffalo, NP  donepezil (ARICEPT) 5 MG tablet Take 1 tablet (5 mg total) by mouth at bedtime. 08/27/14  Yes Kerrie Buffalo, NP  glimepiride (AMARYL) 4 MG tablet Take 1 tablet (4 mg total) by mouth daily with breakfast. 08/27/14  Yes Kerrie Buffalo, NP  hydrOXYzine (ATARAX/VISTARIL) 25 MG tablet Take 25 mg by mouth as needed for anxiety. 2 at bedtime 08/27/14  Yes Kerrie Buffalo, NP  Ipratropium-Albuterol (COMBIVENT) 20-100 MCG/ACT AERS respimat Inhale 1 puff into the lungs every 6 (six) hours.   Yes Historical Provider, MD  lamoTRIgine (LAMICTAL) 100 MG tablet Take 1 tablet (100 mg total) by mouth every evening. 08/27/14  Yes Kerrie Buffalo, NP  latanoprost (XALATAN) 0.005 % ophthalmic solution Place 1 drop into both eyes at bedtime. 08/27/14  Yes Kerrie Buffalo, NP  lisinopril (PRINIVIL,ZESTRIL) 40 MG tablet Take 1 tablet (40 mg total) by mouth daily. 08/27/14  Yes Kerrie Buffalo, NP  metFORMIN (GLUCOPHAGE) 1000 MG tablet Take 1 tablet (1,000 mg total) by mouth 2 (two) times daily with a meal. 08/27/14  Yes Kerrie Buffalo, NP  mirtazapine (REMERON)  7.5 MG tablet Take 1 tablet (7.5 mg total) by mouth at bedtime. 08/27/14  Yes Kerrie Buffalo, NP  Multiple Vitamin (MULTIVITAMIN WITH MINERALS) TABS tablet Take 1 tablet by mouth daily. 08/27/14  Yes Kerrie Buffalo, NP  oxyCODONE-acetaminophen (PERCOCET/ROXICET) 5-325 MG per tablet  01/25/15  Yes Historical Provider, MD  Selenium Sulf-Pyrithione-Urea 2.25 % SHAM  01/25/15  Yes Historical Provider, MD  simvastatin (ZOCOR) 40 MG tablet Take 1 tablet (40 mg total) by mouth daily. 08/27/14  Yes Kerrie Buffalo, NP      Allergies  Allergen Reactions  . Codeine Nausea And Vomiting  . Prednisone Other (See Comments)    Has to monitor because she is a diabetic     ROS:  Out of a complete 14 system review of symptoms, the patient complains only of the following symptoms, and all other reviewed systems are negative.   Activity change  Frequent waking  Back pain, muscle cramps, walking difficulty  Blood pressure 133/81, pulse 109, height _0  (1.6 m), weight 199 lb 6.4 oz (90.447 kg).  Physical Exam  General: The patient is alert and cooperative at the time of the examination. The patient is moderately obese.  Eyes: Pupils are equal, round, and reactive to light. Discs are flat bilaterally.  Neck: The neck is supple, no carotid bruits are noted.  Respiratory: The respiratory examination is clear.  Cardiovascular: The cardiovascular examination reveals a regular rate and rhythm, no obvious murmurs or rubs are noted.  Skin: Extremities are without significant edema.  Neurologic Exam  Mental status: The patient is alert and oriented x 3 at the time of the examination. The patient has apparent normal recent and remote memory, with an apparently normal attention span and concentration ability. Mini-Mental status examination done today shows a total score of 28/30.  Cranial nerves: Facial symmetry is present. There is good sensation of the face to pinprick and soft touch bilaterally. The  strength of the facial muscles and the muscles to head turning and shoulder shrug are normal bilaterally. Speech is well enunciated, no aphasia or dysarthria is noted. Extraocular movements are full. Visual fields are full. The tongue is midline, and the patient has symmetric elevation of the soft palate. No obvious hearing deficits are noted.  Motor: The motor testing reveals 5 over 5 strength of all 4 extremities. Good symmetric motor tone is noted throughout.  Sensory: Sensory testing is intact to pinprick, soft touch, vibration sensation, and position sense on all 4 extremities. No evidence of extinction is noted.  Coordination: Cerebellar testing reveals good finger-nose-finger and heel-to-shin bilaterally.  Gait and station: Gait is wide-based, the patient is able take only a few short shuffling steps. Tandem gait was not tested. Romberg is negative.  Reflexes: Deep tendon reflexes are symmetric and normal bilaterally.  Toes are downgoing bilaterally.   Assessment/Plan:   1. Chronic low back pain   2. Gait disturbance   The patient is having increased problems with low back pain recently. She has a chronic issue with the back, but it has significant worsened. She is not able to ambulate well because of the pain. She will be placed on Robaxin 500 mg 3 times daily, and she will be set up for nerve conduction studies on both legs, EMG on both legs. We will consider MRI of the low back in the future if she is not gaining improvement. She did not respond to epidural steroid injections previously.  Jill Alexanders MD 05/09/2015 7:39 PM  [facility] [address] [place], [place] [address]  Phone [phone] Fax [phone]

## 2015-05-09 NOTE — Patient Instructions (Signed)
Lumbosacral Strain Lumbosacral strain is a strain of any of the parts that make up your lumbosacral vertebrae. Your lumbosacral vertebrae are the bones that make up the lower third of your backbone. Your lumbosacral vertebrae are held together by muscles and tough, fibrous tissue (ligaments).  CAUSES  A sudden blow to your back can cause lumbosacral strain. Also, anything that causes an excessive stretch of the muscles in the low back can cause this strain. This is typically seen when people exert themselves strenuously, fall, lift heavy objects, bend, or crouch repeatedly. RISK FACTORS  Physically demanding work.  Participation in pushing or pulling sports or sports that require a sudden twist of the back (tennis, golf, baseball).  Weight lifting.  Excessive lower back curvature.  Forward-tilted pelvis.  Weak back or abdominal muscles or both.  Tight hamstrings. SIGNS AND SYMPTOMS  Lumbosacral strain may cause pain in the area of your injury or pain that moves (radiates) down your leg.  DIAGNOSIS Your health care provider can often diagnose lumbosacral strain through a physical exam. In some cases, you may need tests such as X-ray exams.  TREATMENT  Treatment for your lower back injury depends on many factors that your clinician will have to evaluate. However, most treatment will include the use of anti-inflammatory medicines. HOME CARE INSTRUCTIONS   Avoid hard physical activities (tennis, racquetball, waterskiing) if you are not in proper physical condition for it. This may aggravate or create problems.  If you have a back problem, avoid sports requiring sudden body movements. Swimming and walking are generally safer activities.  Maintain good posture.  Maintain a healthy weight.  For acute conditions, you may put ice on the injured area.  Put ice in a plastic bag.  Place a towel between your skin and the bag.  Leave the ice on for 20 minutes, 2-3 times a day.  When the  low back starts healing, stretching and strengthening exercises may be recommended. SEEK MEDICAL CARE IF:  Your back pain is getting worse.  You experience severe back pain not relieved with medicines. SEEK IMMEDIATE MEDICAL CARE IF:   You have numbness, tingling, weakness, or problems with the use of your arms or legs.  There is a change in bowel or bladder control.  You have increasing pain in any area of the body, including your belly (abdomen).  You notice shortness of breath, dizziness, or feel faint.  You feel sick to your stomach (nauseous), are throwing up (vomiting), or become sweaty.  You notice discoloration of your toes or legs, or your feet get very cold. MAKE SURE YOU:   Understand these instructions.  Will watch your condition.  Will get help right away if you are not doing well or get worse. Document Released: 09/09/2005 Document Revised: 12/05/2013 Document Reviewed: 07/19/2013 [facility] Patient Information 2015 [place], [place]. This information is not intended to replace advice given to you by your health care provider. Make sure you discuss any questions you have with your health care provider.

## 2015-05-27 ENCOUNTER — Encounter: Payer: Self-pay | Admitting: Neurology

## 2015-05-27 ENCOUNTER — Ambulatory Visit (INDEPENDENT_AMBULATORY_CARE_PROVIDER_SITE_OTHER): Payer: Self-pay | Admitting: Neurology

## 2015-05-27 ENCOUNTER — Ambulatory Visit (INDEPENDENT_AMBULATORY_CARE_PROVIDER_SITE_OTHER): Payer: Medicaid Other | Admitting: Neurology

## 2015-05-27 DIAGNOSIS — R269 Unspecified abnormalities of gait and mobility: Secondary | ICD-10-CM

## 2015-05-27 DIAGNOSIS — R413 Other amnesia: Secondary | ICD-10-CM

## 2015-05-27 DIAGNOSIS — R2689 Other abnormalities of gait and mobility: Secondary | ICD-10-CM | POA: Diagnosis not present

## 2015-05-27 DIAGNOSIS — G8929 Other chronic pain: Secondary | ICD-10-CM

## 2015-05-27 DIAGNOSIS — M545 Low back pain: Secondary | ICD-10-CM

## 2015-05-27 NOTE — Procedures (Signed)
     HISTORY:  Elizabeth Thompson is a 64 year old patient with a history low back pain and right-sided sciatica. The patient has muscular cramps in the posterior region of the left leg as well. She is being evaluated for ongoing severe symptoms.  NERVE CONDUCTION STUDIES:  Nerve conduction studies were performed on both lower extremitities. The distal motor latencies and motor amplitudes for the peroneal and posterior tibial nerves were within normal limits. The nerve conduction velocities for these nerves were also normal. The H reflex latencies were normal. The sensory latencies for the peroneal nerves were within normal limits.   EMG STUDIES:  EMG study was performed on the right lower extremity:  The tibialis anterior muscle reveals 2 to 3K motor units with decreased recruitment. 2+ fibrillations and positive waves were seen. The peroneus tertius muscle reveals 2 to 3K motor units with decreased recruitment. 2+ fibrillations and positive waves were seen. The medial gastrocnemius muscle reveals 1 to 2K motor units with decreased recruitment. 2+ fibrillations and positive waves were seen. The vastus lateralis muscle reveals 2 to 4K motor units with full recruitment. 1+ fibrillations and positive waves were seen. The iliopsoas muscle reveals 2 to 3K motor units with slightly decreased recruitment. 2+ fibrillations and positive waves were seen. The biceps femoris muscle (long head) reveals 2 to 3K motor units with slightly decreased recruitment. 2+ fibrillations and positive waves were seen. The lumbosacral paraspinal muscles were tested at 3 levels, and revealed 2+ positive waves at the middle and lower levels tested. No abnormalities were seen at the upper level. There was good relaxation.  EMG study was performed on the left lower extremity:  The tibialis anterior muscle reveals 2 to 3K motor units with decreased recruitment. 2+ fibrillations and positive waves were seen. The peroneus  tertius muscle reveals 2 to 3K motor units with decreased recruitment. 2+ fibrillations and positive waves were seen. The medial gastrocnemius muscle reveals 1 to 3K motor units with decreased recruitment. 2+ fibrillations and positive waves were seen. The vastus lateralis muscle reveals 2 to 3K motor units with full recruitment. No fibrillations or positive waves were seen. The iliopsoas muscle reveals 2 to 3K motor units with slightly decreased recruitment. 2+ fibrillations and positive waves were seen.   IMPRESSION:  Nerve conduction studies done on both lower extremities were unremarkable, without evidence of a peripheral neuropathy. EMG of the right lower extremity shows evidence of multilevel denervation involving the L2-S1 nerve roots. A more limited EMG of the left lower extremity appears to show similar findings to that on the right leg. The possibility of severe spinal stenosis in the upper lumbar or lower thoracic level needs to be considered.  Jill Alexanders MD 05/27/2015 4:21 PM  [facility] [address] [place], [place] [address]  Phone [phone] Fax [phone]

## 2015-05-27 NOTE — Progress Notes (Signed)
Please refer to EMG and NCV procedure note.

## 2015-08-01 ENCOUNTER — Ambulatory Visit: Payer: Self-pay | Admitting: Internal Medicine

## 2015-08-09 ENCOUNTER — Other Ambulatory Visit (HOSPITAL_COMMUNITY): Payer: Self-pay

## 2015-08-09 ENCOUNTER — Encounter (HOSPITAL_COMMUNITY)
Admission: RE | Admit: 2015-08-09 | Discharge: 2015-08-09 | Disposition: A | Discharge status: Home / Self Care | Payer: Medicaid Other | Source: Ambulatory Visit | Attending: Orthopedic Surgery | Admitting: Orthopedic Surgery

## 2015-08-09 ENCOUNTER — Encounter (HOSPITAL_COMMUNITY): Payer: Self-pay

## 2015-08-09 DIAGNOSIS — M79604 Pain in right leg: Secondary | ICD-10-CM | POA: Insufficient documentation

## 2015-08-09 DIAGNOSIS — Z01812 Encounter for preprocedural laboratory examination: Secondary | ICD-10-CM | POA: Insufficient documentation

## 2015-08-09 DIAGNOSIS — M4807 Spinal stenosis, lumbosacral region: Secondary | ICD-10-CM | POA: Diagnosis not present

## 2015-08-09 HISTORY — DX: Frequency of micturition: R35.0

## 2015-08-09 HISTORY — DX: Bronchitis, not specified as acute or chronic: J40

## 2015-08-09 HISTORY — DX: Gastro-esophageal reflux disease without esophagitis: K21.9

## 2015-08-09 HISTORY — DX: Unspecified osteoarthritis, unspecified site: M19.90

## 2015-08-09 HISTORY — DX: Cyst of kidney, acquired: N28.1

## 2015-08-09 LAB — CBC
HCT: 39.5 % (ref 36.0–46.0)
Hemoglobin: 12.7 g/dL (ref 12.0–15.0)
MCH: 26.2 pg (ref 26.0–34.0)
MCHC: 32.2 g/dL (ref 30.0–36.0)
MCV: 81.4 fL (ref 78.0–100.0)
PLATELETS: 372 10*3/uL (ref 150–400)
RBC: 4.85 MIL/uL (ref 3.87–5.11)
RDW: 16.2 % — AB (ref 11.5–15.5)
WBC: 7.9 10*3/uL (ref 4.0–10.5)

## 2015-08-09 LAB — BASIC METABOLIC PANEL
Anion gap: 10 (ref 5–15)
BUN: 9 mg/dL (ref 6–20)
CALCIUM: 10.1 mg/dL (ref 8.9–10.3)
CO2: 23 mmol/L (ref 22–32)
Chloride: 106 mmol/L (ref 101–111)
Creatinine, Ser: 0.96 mg/dL (ref 0.44–1.00)
GFR calc Af Amer: >60 mL/min (ref >60)
GLUCOSE: 108 mg/dL — AB (ref 65–99)
POTASSIUM: 4.3 mmol/L (ref 3.5–5.1)
Sodium: 139 mmol/L (ref 135–145)

## 2015-08-09 LAB — GLUCOSE, CAPILLARY: Glucose-Capillary: 152 mg/dL — ABNORMAL HIGH (ref 65–99)

## 2015-08-09 LAB — SURGICAL PCR SCREEN
MRSA, PCR: NEGATIVE
Staphylococcus aureus: NEGATIVE

## 2015-08-09 NOTE — Pre-Procedure Instructions (Signed)
KADA FRIESEN  08/09/2015       Your procedure is scheduled on : Wednesday August 14, 2015 at 1:00 PM.  Report to [facility] Admitting at 11:00 A.M.  Call this number if you have problems the morning of surgery: [phone]    Remember:  Do not eat food or drink liquids after midnight.  Take these medicines the morning of surgery with A SIP OF WATER : Albuterol inhaler if needed, Combivent inhaler, Oxycodone if needed   Do NOT take any diabetic medications the morning of your surgery (NO Metformin/Glucophage or Glimepiride/Amaryl)   Stop taking any vitamins, herbal medications, Ibuprofen, Advil, Motrin, Aleve, etc   Do not wear jewelry, make-up or nail polish.  Do not wear lotions, powders, or perfumes.    Do not shave 48 hours prior to surgery.    Do not bring valuables to the hospital.  [facility] is not responsible for any belongings or valuables.  Contacts, dentures or bridgework may not be worn into surgery.  Leave your suitcase in the car.  After surgery it may be brought to your room.  For patients admitted to the hospital, discharge time will be determined by your treatment team.  Patients discharged the day of surgery will not be allowed to drive home.   Name and phone number of your driver:    Special instructions:  Shower using CHG soap the night before and the morning of your surgery  Please read over the following fact sheets that you were given. Pain Booklet, Coughing and Deep Breathing, MRSA Information and Surgical Site Infection Prevention    How to Manage Your Diabetes Before Surgery   Why is it important to control my blood sugar before and after surgery?   Improving blood sugar levels before and after surgery helps healing and can limit problems.  A way of improving blood sugar control is eating a healthy diet by:  - Eating less sugar and carbohydrates  - Increasing activity/exercise  - Talk with your doctor about reaching your blood  sugar goals  High blood sugars (greater than 180 mg/dL) can raise your risk of infections and slow down your recovery so you will need to focus on controlling your diabetes during the weeks before surgery.  Make sure that the doctor who takes care of your diabetes knows about your planned surgery including the date and location.  How do I manage my blood sugars before surgery?   Check your blood sugar at least 4 times a day, 2 days before surgery to make sure that they are not too high or low.   Check your blood sugar the morning of your surgery when you wake up and every 2 hours until you get to the Short-Stay unit.  If your blood sugar is less than 70 mg/dL, you will need to treat for low blood sugar by:  Treat a low blood sugar (less than 70 mg/dL) with 1/2 cup of clear juice (cranberry or apple), 4 glucose tablets, OR glucose gel.  Recheck blood sugar in 15 minutes after treatment (to make sure it is greater than 70 mg/dL).  If blood sugar is not greater than 70 mg/dL on re-check, call [phone] for further instructions.   Report your blood sugar to the Short-Stay nurse when you get to Short-Stay.  References:  [facility], 2007 "How to Manage your Diabetes Before and After Surgery".  What do I do about my diabetes medications?   Do not  take oral diabetes medicines (pills) the morning of surgery.

## 2015-08-09 NOTE — Progress Notes (Signed)
PCP is Everardo Beals  Neurologist is Margette Fast  Patient arrived to PAT appointment via wheelchair with her niece Elizabeth Thompson. Patient denied having any acute cardiac or pulmonary issues at this time. Patient was alert and oriented X 4, and responded appropriately during PAT questioning about past surgical and medical history.  Nurse inquired about blood glucose and patient informed Nurse the highest her glucose has been was 198 and the lowest was 103. Elizabeth Thompson informed Nurse that the last A1C was done at [facility] and it was 7.7.

## 2015-08-09 NOTE — Progress Notes (Signed)
   08/09/15 1240  OBSTRUCTIVE SLEEP APNEA  Have you ever been diagnosed with sleep apnea through a sleep study? No  Do you snore loudly (loud enough to be heard through closed doors)?  1  Do you often feel tired, fatigued, or sleepy during the daytime? 1  Has anyone observed you stop breathing during your sleep? 0  Do you have, or are you being treated for high blood pressure? 1  BMI more than 35 kg/m2? 1  Age over 65 years old? 1  Neck circumference greater than 40 cm/16 inches? 0  Gender: 0  Obstructive Sleep Apnea Score 5   This patient has screened at risk for sleep apnea using the STOP Bang tool used during a pre-surgical visit. A score of 4 or greater is at risk for sleep apnea.

## 2015-08-10 LAB — HEMOGLOBIN A1C
Hgb A1c MFr Bld: 7 % — ABNORMAL HIGH (ref 4.8–5.6)
Mean Plasma Glucose: 154 mg/dL

## 2015-08-13 MED ORDER — ACETAMINOPHEN 10 MG/ML IV SOLN
1000.0000 mg | Freq: Four times a day (QID) | INTRAVENOUS | Status: DC
  Administered 2015-08-14: 1000 mg via INTRAVENOUS
  Filled 2015-08-13: qty 100

## 2015-08-13 MED ORDER — CEFAZOLIN SODIUM-DEXTROSE 2-3 GM-% IV SOLR
2.0000 g | INTRAVENOUS | Status: AC
  Administered 2015-08-14 (×2): 2 g via INTRAVENOUS
  Filled 2015-08-13: qty 50

## 2015-08-14 ENCOUNTER — Encounter (HOSPITAL_COMMUNITY)
Admission: RE | Disposition: A | Discharge status: Skilled Nursing Facility | Payer: Self-pay | Source: Ambulatory Visit | Attending: Orthopedic Surgery

## 2015-08-14 ENCOUNTER — Inpatient Hospital Stay (HOSPITAL_COMMUNITY): Payer: Medicaid Other

## 2015-08-14 ENCOUNTER — Inpatient Hospital Stay (HOSPITAL_COMMUNITY): Payer: Medicaid Other | Admitting: Certified Registered Nurse Anesthetist

## 2015-08-14 ENCOUNTER — Encounter (HOSPITAL_COMMUNITY): Payer: Self-pay | Admitting: *Deleted

## 2015-08-14 ENCOUNTER — Inpatient Hospital Stay (HOSPITAL_COMMUNITY)
Admission: RE | Admit: 2015-08-14 | Discharge: 2015-08-20 | DRG: 520 | Disposition: A | Discharge status: Skilled Nursing Facility | Payer: Medicaid Other | Source: Ambulatory Visit | Attending: Orthopedic Surgery | Admitting: Orthopedic Surgery

## 2015-08-14 DIAGNOSIS — Z79899 Other long term (current) drug therapy: Secondary | ICD-10-CM

## 2015-08-14 DIAGNOSIS — F17210 Nicotine dependence, cigarettes, uncomplicated: Secondary | ICD-10-CM | POA: Diagnosis present

## 2015-08-14 DIAGNOSIS — Z888 Allergy status to other drugs, medicaments and biological substances status: Secondary | ICD-10-CM | POA: Diagnosis not present

## 2015-08-14 DIAGNOSIS — K219 Gastro-esophageal reflux disease without esophagitis: Secondary | ICD-10-CM | POA: Diagnosis present

## 2015-08-14 DIAGNOSIS — Z885 Allergy status to narcotic agent status: Secondary | ICD-10-CM

## 2015-08-14 DIAGNOSIS — F319 Bipolar disorder, unspecified: Secondary | ICD-10-CM | POA: Diagnosis present

## 2015-08-14 DIAGNOSIS — Z419 Encounter for procedure for purposes other than remedying health state, unspecified: Secondary | ICD-10-CM

## 2015-08-14 DIAGNOSIS — E785 Hyperlipidemia, unspecified: Secondary | ICD-10-CM | POA: Diagnosis present

## 2015-08-14 DIAGNOSIS — E119 Type 2 diabetes mellitus without complications: Secondary | ICD-10-CM | POA: Diagnosis present

## 2015-08-14 DIAGNOSIS — I10 Essential (primary) hypertension: Secondary | ICD-10-CM | POA: Diagnosis present

## 2015-08-14 DIAGNOSIS — M4806 Spinal stenosis, lumbar region: Secondary | ICD-10-CM | POA: Diagnosis present

## 2015-08-14 DIAGNOSIS — M549 Dorsalgia, unspecified: Secondary | ICD-10-CM | POA: Diagnosis present

## 2015-08-14 DIAGNOSIS — M545 Low back pain: Secondary | ICD-10-CM | POA: Diagnosis present

## 2015-08-14 DIAGNOSIS — Z79891 Long term (current) use of opiate analgesic: Secondary | ICD-10-CM

## 2015-08-14 DIAGNOSIS — F259 Schizoaffective disorder, unspecified: Secondary | ICD-10-CM | POA: Diagnosis present

## 2015-08-14 HISTORY — PX: LUMBAR LAMINECTOMY/DECOMPRESSION MICRODISCECTOMY: SHX5026

## 2015-08-14 LAB — GLUCOSE, CAPILLARY
GLUCOSE-CAPILLARY: 166 mg/dL — AB (ref 65–99)
Glucose-Capillary: 84 mg/dL (ref 65–99)
Glucose-Capillary: 149 mg/dL — ABNORMAL HIGH (ref 65–99)

## 2015-08-14 SURGERY — LUMBAR LAMINECTOMY/DECOMPRESSION MICRODISCECTOMY 3 LEVELS
Anesthesia: General | Site: Spine Lumbar

## 2015-08-14 MED ORDER — LACTATED RINGERS IV SOLN
INTRAVENOUS | Status: DC
  Administered 2015-08-15: 06:00:00 via INTRAVENOUS

## 2015-08-14 MED ORDER — FENTANYL CITRATE (PF) 250 MCG/5ML IJ SOLN
INTRAMUSCULAR | Status: AC
  Filled 2015-08-14: qty 5

## 2015-08-14 MED ORDER — PROMETHAZINE HCL 25 MG/ML IJ SOLN: 6.2500 mg | INTRAMUSCULAR | Status: DC | PRN

## 2015-08-14 MED ORDER — FENTANYL CITRATE (PF) 100 MCG/2ML IJ SOLN
INTRAMUSCULAR | Status: DC | PRN
  Administered 2015-08-14 (×2): 50 ug via INTRAVENOUS
  Administered 2015-08-14: 100 ug via INTRAVENOUS
  Administered 2015-08-14: 50 ug via INTRAVENOUS
  Administered 2015-08-14: 25 ug via INTRAVENOUS
  Administered 2015-08-14: 100 ug via INTRAVENOUS

## 2015-08-14 MED ORDER — OXYCODONE-ACETAMINOPHEN 10-325 MG PO TABS: 1.0000 | ORAL_TABLET | ORAL | Status: DC | PRN

## 2015-08-14 MED ORDER — ALBUTEROL SULFATE (2.5 MG/3ML) 0.083% IN NEBU: 2.5000 mg | INHALATION_SOLUTION | Freq: Four times a day (QID) | RESPIRATORY_TRACT | Status: DC | PRN

## 2015-08-14 MED ORDER — PHENOL 1.4 % MT LIQD: 1.0000 | OROMUCOSAL | Status: DC | PRN

## 2015-08-14 MED ORDER — SODIUM CHLORIDE 0.9 % IV SOLN: 250.0000 mL | INTRAVENOUS | Status: DC

## 2015-08-14 MED ORDER — OXYCODONE HCL 5 MG PO TABS
10.0000 mg | ORAL_TABLET | ORAL | Status: DC | PRN
  Administered 2015-08-15: 10 mg via ORAL
  Filled 2015-08-14 (×3): qty 2

## 2015-08-14 MED ORDER — CEFAZOLIN SODIUM 1-5 GM-% IV SOLN
1.0000 g | Freq: Three times a day (TID) | INTRAVENOUS | Status: AC
  Administered 2015-08-15 (×2): 1 g via INTRAVENOUS
  Filled 2015-08-14 (×2): qty 50

## 2015-08-14 MED ORDER — NEOSTIGMINE METHYLSULFATE 10 MG/10ML IV SOLN
INTRAVENOUS | Status: DC | PRN
  Administered 2015-08-14: 5 mg via INTRAVENOUS

## 2015-08-14 MED ORDER — METHOCARBAMOL 1000 MG/10ML IJ SOLN
500.0000 mg | Freq: Four times a day (QID) | INTRAVENOUS | Status: DC | PRN
  Filled 2015-08-14: qty 5

## 2015-08-14 MED ORDER — IPRATROPIUM-ALBUTEROL 0.5-2.5 (3) MG/3ML IN SOLN
3.0000 mL | Freq: Four times a day (QID) | RESPIRATORY_TRACT | Status: DC
  Administered 2015-08-14 – 2015-08-15 (×5): 3 mL via RESPIRATORY_TRACT
  Filled 2015-08-14 (×5): qty 3

## 2015-08-14 MED ORDER — SIMVASTATIN 40 MG PO TABS
40.0000 mg | ORAL_TABLET | Freq: Every evening | ORAL | Status: DC
  Administered 2015-08-14 – 2015-08-19 (×6): 40 mg via ORAL
  Filled 2015-08-14 (×6): qty 1

## 2015-08-14 MED ORDER — HYDROMORPHONE HCL 1 MG/ML IJ SOLN
INTRAMUSCULAR | Status: AC
  Filled 2015-08-14: qty 1

## 2015-08-14 MED ORDER — ROCURONIUM BROMIDE 100 MG/10ML IV SOLN
INTRAVENOUS | Status: DC | PRN
  Administered 2015-08-14: 10 mg via INTRAVENOUS
  Administered 2015-08-14: 50 mg via INTRAVENOUS

## 2015-08-14 MED ORDER — LISINOPRIL 40 MG PO TABS
40.0000 mg | ORAL_TABLET | Freq: Every evening | ORAL | Status: DC
  Administered 2015-08-14 – 2015-08-19 (×6): 40 mg via ORAL
  Filled 2015-08-14 (×6): qty 1

## 2015-08-14 MED ORDER — HEMOSTATIC AGENTS (NO CHARGE) OPTIME
TOPICAL | Status: DC | PRN
  Administered 2015-08-14: 1 via TOPICAL

## 2015-08-14 MED ORDER — MENTHOL 3 MG MT LOZG
1.0000 | LOZENGE | OROMUCOSAL | Status: DC | PRN
  Filled 2015-08-14: qty 9

## 2015-08-14 MED ORDER — ONDANSETRON HCL 4 MG PO TABS: 4.0000 mg | ORAL_TABLET | Freq: Three times a day (TID) | ORAL | Status: DC | PRN

## 2015-08-14 MED ORDER — SODIUM CHLORIDE 0.9 % IJ SOLN: 3.0000 mL | INTRAMUSCULAR | Status: DC | PRN

## 2015-08-14 MED ORDER — MIDAZOLAM HCL 2 MG/2ML IJ SOLN
INTRAMUSCULAR | Status: AC
  Filled 2015-08-14: qty 4

## 2015-08-14 MED ORDER — THROMBIN 20000 UNITS EX KIT
PACK | CUTANEOUS | Status: DC | PRN
  Administered 2015-08-14: 20000 [IU] via TOPICAL

## 2015-08-14 MED ORDER — HYDROMORPHONE HCL 1 MG/ML IJ SOLN
0.2500 mg | INTRAMUSCULAR | Status: DC | PRN
  Administered 2015-08-14: 0.5 mg via INTRAVENOUS

## 2015-08-14 MED ORDER — METFORMIN HCL 500 MG PO TABS
1000.0000 mg | ORAL_TABLET | Freq: Two times a day (BID) | ORAL | Status: DC
  Administered 2015-08-15 – 2015-08-20 (×10): 1000 mg via ORAL
  Filled 2015-08-14 (×10): qty 2

## 2015-08-14 MED ORDER — THROMBIN 20000 UNITS EX SOLR: CUTANEOUS | Status: DC | PRN

## 2015-08-14 MED ORDER — PHENYLEPHRINE HCL 10 MG/ML IJ SOLN
10.0000 mg | INTRAVENOUS | Status: DC | PRN
  Administered 2015-08-14: 16:00:00 via INTRAVENOUS
  Administered 2015-08-14: 10 ug/min via INTRAVENOUS

## 2015-08-14 MED ORDER — LIDOCAINE HCL (CARDIAC) 20 MG/ML IV SOLN
INTRAVENOUS | Status: DC | PRN
  Administered 2015-08-14: 50 mg via INTRAVENOUS

## 2015-08-14 MED ORDER — LAMOTRIGINE 100 MG PO TABS
100.0000 mg | ORAL_TABLET | Freq: Every evening | ORAL | Status: DC
  Administered 2015-08-14 – 2015-08-19 (×6): 100 mg via ORAL
  Filled 2015-08-14 (×6): qty 1

## 2015-08-14 MED ORDER — PROPOFOL 10 MG/ML IV BOLUS
INTRAVENOUS | Status: DC | PRN
  Administered 2015-08-14: 180 mg via INTRAVENOUS

## 2015-08-14 MED ORDER — ARIPIPRAZOLE 5 MG PO TABS
20.0000 mg | ORAL_TABLET | Freq: Every day | ORAL | Status: DC
  Administered 2015-08-14 – 2015-08-19 (×6): 20 mg via ORAL
  Filled 2015-08-14 (×6): qty 4

## 2015-08-14 MED ORDER — PROPOFOL 10 MG/ML IV BOLUS
INTRAVENOUS | Status: AC
  Filled 2015-08-14: qty 20

## 2015-08-14 MED ORDER — METHOCARBAMOL 500 MG PO TABS: 500.0000 mg | ORAL_TABLET | Freq: Three times a day (TID) | ORAL | Status: DC | PRN

## 2015-08-14 MED ORDER — MIRTAZAPINE 15 MG PO TABS
7.5000 mg | ORAL_TABLET | Freq: Every day | ORAL | Status: DC
  Administered 2015-08-14 – 2015-08-19 (×6): 7.5 mg via ORAL
  Filled 2015-08-14 (×6): qty 1

## 2015-08-14 MED ORDER — IPRATROPIUM-ALBUTEROL 20-100 MCG/ACT IN AERS: 1.0000 | INHALATION_SPRAY | Freq: Four times a day (QID) | RESPIRATORY_TRACT | Status: DC

## 2015-08-14 MED ORDER — GLYCOPYRROLATE 0.2 MG/ML IJ SOLN
INTRAMUSCULAR | Status: DC | PRN
  Administered 2015-08-14: 0.6 mg via INTRAVENOUS

## 2015-08-14 MED ORDER — LACTATED RINGERS IV SOLN
INTRAVENOUS | Status: DC
  Administered 2015-08-14 (×3): via INTRAVENOUS

## 2015-08-14 MED ORDER — BUPIVACAINE-EPINEPHRINE 0.25% -1:200000 IJ SOLN
INTRAMUSCULAR | Status: DC | PRN
  Administered 2015-08-14: 10 mL

## 2015-08-14 MED ORDER — SODIUM CHLORIDE 0.9 % IJ SOLN
3.0000 mL | Freq: Two times a day (BID) | INTRAMUSCULAR | Status: DC
  Administered 2015-08-16 – 2015-08-20 (×7): 3 mL via INTRAVENOUS

## 2015-08-14 MED ORDER — BUPIVACAINE-EPINEPHRINE (PF) 0.25% -1:200000 IJ SOLN
INTRAMUSCULAR | Status: AC
  Filled 2015-08-14: qty 30

## 2015-08-14 MED ORDER — VANCOMYCIN HCL 500 MG IV SOLR
INTRAVENOUS | Status: AC
  Filled 2015-08-14: qty 500

## 2015-08-14 MED ORDER — ESMOLOL HCL 10 MG/ML IV SOLN
INTRAVENOUS | Status: DC | PRN
  Administered 2015-08-14: 20 mg via INTRAVENOUS

## 2015-08-14 MED ORDER — ACETAMINOPHEN 10 MG/ML IV SOLN
1000.0000 mg | Freq: Four times a day (QID) | INTRAVENOUS | Status: AC
  Administered 2015-08-15 (×3): 1000 mg via INTRAVENOUS
  Filled 2015-08-14 (×3): qty 100

## 2015-08-14 MED ORDER — FLEET ENEMA 7-19 GM/118ML RE ENEM: 1.0000 | ENEMA | Freq: Once | RECTAL | Status: DC

## 2015-08-14 MED ORDER — 0.9 % SODIUM CHLORIDE (POUR BTL) OPTIME
TOPICAL | Status: DC | PRN
  Administered 2015-08-14: 1000 mL

## 2015-08-14 MED ORDER — PHENYLEPHRINE HCL 10 MG/ML IJ SOLN
INTRAMUSCULAR | Status: DC | PRN
  Administered 2015-08-14: 80 ug via INTRAVENOUS

## 2015-08-14 MED ORDER — VANCOMYCIN HCL 500 MG IV SOLR
INTRAVENOUS | Status: DC | PRN
  Administered 2015-08-14: 500 mg

## 2015-08-14 MED ORDER — INSULIN ASPART 100 UNIT/ML ~~LOC~~ SOLN
0.0000 [IU] | SUBCUTANEOUS | Status: DC
  Administered 2015-08-15: 2 [IU] via SUBCUTANEOUS
  Administered 2015-08-15 – 2015-08-17 (×4): 3 [IU] via SUBCUTANEOUS
  Administered 2015-08-19: 2 [IU] via SUBCUTANEOUS
  Administered 2015-08-19: 3 [IU] via SUBCUTANEOUS
  Administered 2015-08-20: 2 [IU] via SUBCUTANEOUS
  Administered 2015-08-20: 5 [IU] via SUBCUTANEOUS
  Administered 2015-08-20: 2 [IU] via SUBCUTANEOUS

## 2015-08-14 MED ORDER — HEMOSTATIC AGENTS (NO CHARGE) OPTIME
TOPICAL | Status: DC | PRN
  Administered 2015-08-14 (×2): 1 via TOPICAL

## 2015-08-14 MED ORDER — METHOCARBAMOL 500 MG PO TABS
500.0000 mg | ORAL_TABLET | Freq: Four times a day (QID) | ORAL | Status: DC | PRN
  Administered 2015-08-16 – 2015-08-19 (×4): 500 mg via ORAL
  Filled 2015-08-14 (×6): qty 1

## 2015-08-14 MED ORDER — GLIMEPIRIDE 4 MG PO TABS
4.0000 mg | ORAL_TABLET | Freq: Every day | ORAL | Status: DC
  Administered 2015-08-15 – 2015-08-20 (×5): 4 mg via ORAL
  Filled 2015-08-14 (×5): qty 1

## 2015-08-14 MED ORDER — ALBUTEROL SULFATE HFA 108 (90 BASE) MCG/ACT IN AERS: 2.0000 | INHALATION_SPRAY | Freq: Four times a day (QID) | RESPIRATORY_TRACT | Status: DC | PRN

## 2015-08-14 MED ORDER — ONDANSETRON HCL 4 MG/2ML IJ SOLN: 4.0000 mg | INTRAMUSCULAR | Status: DC | PRN

## 2015-08-14 MED ORDER — HYDROXYZINE HCL 25 MG PO TABS
50.0000 mg | ORAL_TABLET | Freq: Every day | ORAL | Status: DC
  Administered 2015-08-14 – 2015-08-19 (×6): 50 mg via ORAL
  Filled 2015-08-14 (×6): qty 2

## 2015-08-14 MED ORDER — ONDANSETRON HCL 4 MG/2ML IJ SOLN
INTRAMUSCULAR | Status: DC | PRN
  Administered 2015-08-14: 4 mg via INTRAVENOUS

## 2015-08-14 MED ORDER — DONEPEZIL HCL 5 MG PO TABS
5.0000 mg | ORAL_TABLET | Freq: Every day | ORAL | Status: DC
  Administered 2015-08-14 – 2015-08-19 (×6): 5 mg via ORAL
  Filled 2015-08-14 (×6): qty 1

## 2015-08-14 MED ORDER — MAGNESIUM CITRATE PO SOLN
0.5000 | Freq: Once | ORAL | Status: AC
  Administered 2015-08-15: 0.5 via ORAL
  Filled 2015-08-14: qty 296

## 2015-08-14 SURGICAL SUPPLY — 70 items
ADH SKN CLS APL DERMABOND .7 (GAUZE/BANDAGES/DRESSINGS) ×1
BUR EGG ELITE 4.0 (BURR) IMPLANT
BUR EGG ELITE 4.0MM (BURR)
BUR MATCHSTICK NEURO 3.0 LAGG (BURR) ×1 IMPLANT
CANISTER SUCTION 2500CC (MISCELLANEOUS) ×3 IMPLANT
CLOSURE STERI-STRIP 1/2X4 (GAUZE/BANDAGES/DRESSINGS) ×1
CLOSURE WOUND 1/2 X4 (GAUZE/BANDAGES/DRESSINGS) ×1
CLSR STERI-STRIP ANTIMIC 1/2X4 (GAUZE/BANDAGES/DRESSINGS) ×1 IMPLANT
CORDS BIPOLAR (ELECTRODE) ×3 IMPLANT
COVER SURGICAL LIGHT HANDLE (MISCELLANEOUS) ×3 IMPLANT
DERMABOND ADVANCED (GAUZE/BANDAGES/DRESSINGS) ×2
DERMABOND ADVANCED .7 DNX12 (GAUZE/BANDAGES/DRESSINGS) ×1 IMPLANT
DRAIN CHANNEL 15F RND FF W/TCR (WOUND CARE) ×1 IMPLANT
DRAPE POUCH INSTRU U-SHP 10X18 (DRAPES) ×3 IMPLANT
DRAPE SURG 17X23 STRL (DRAPES) ×3 IMPLANT
DRAPE U-SHAPE 47X51 STRL (DRAPES) ×3 IMPLANT
DRSG MEPILEX BORDER 4X4 (GAUZE/BANDAGES/DRESSINGS) ×2 IMPLANT
DRSG MEPILEX BORDER 4X8 (GAUZE/BANDAGES/DRESSINGS) ×3 IMPLANT
DURAPREP 26ML APPLICATOR (WOUND CARE) ×3 IMPLANT
ELECT BLADE 4.0 EZ CLEAN MEGAD (MISCELLANEOUS)
ELECT CAUTERY BLADE 6.4 (BLADE) ×3 IMPLANT
ELECT PENCIL ROCKER SW 15FT (MISCELLANEOUS) ×3 IMPLANT
ELECT REM PT RETURN 9FT ADLT (ELECTROSURGICAL) ×3
ELECTRODE BLDE 4.0 EZ CLN MEGD (MISCELLANEOUS) IMPLANT
ELECTRODE REM PT RTRN 9FT ADLT (ELECTROSURGICAL) ×1 IMPLANT
EVACUATOR 1/8 PVC DRAIN (DRAIN) ×2 IMPLANT
EVACUATOR SILICONE 100CC (DRAIN) ×1 IMPLANT
GLOVE BIOGEL PI IND STRL 8 (GLOVE) ×1 IMPLANT
GLOVE BIOGEL PI IND STRL 8.5 (GLOVE) ×1 IMPLANT
GLOVE BIOGEL PI INDICATOR 8 (GLOVE) ×2
GLOVE BIOGEL PI INDICATOR 8.5 (GLOVE) ×6
GLOVE ORTHO TXT STRL SZ7.5 (GLOVE) ×3 IMPLANT
GLOVE SS BIOGEL STRL SZ 8.5 (GLOVE) ×1 IMPLANT
GLOVE SUPERSENSE BIOGEL SZ 8.5 (GLOVE) ×2
GOWN STRL REUS W/ TWL LRG LVL3 (GOWN DISPOSABLE) ×1 IMPLANT
GOWN STRL REUS W/TWL 2XL LVL3 (GOWN DISPOSABLE) ×6 IMPLANT
GOWN STRL REUS W/TWL LRG LVL3 (GOWN DISPOSABLE) ×3
KIT BASIN OR (CUSTOM PROCEDURE TRAY) ×3 IMPLANT
KIT ROOM TURNOVER OR (KITS) ×3 IMPLANT
KIT STIMULAN RAPID CURE 5CC (Orthopedic Implant) ×2 IMPLANT
NDL SPNL 18GX3.5 QUINCKE PK (NEEDLE) ×2 IMPLANT
NEEDLE 22X1 1/2 (OR ONLY) (NEEDLE) ×3 IMPLANT
NEEDLE SPNL 18GX3.5 QUINCKE PK (NEEDLE) ×6 IMPLANT
NS IRRIG 1000ML POUR BTL (IV SOLUTION) ×5 IMPLANT
PACK LAMINECTOMY ORTHO (CUSTOM PROCEDURE TRAY) ×3 IMPLANT
PACK UNIVERSAL I (CUSTOM PROCEDURE TRAY) ×3 IMPLANT
PAD ARMBOARD 7.5X6 YLW CONV (MISCELLANEOUS) ×6 IMPLANT
PATTIES SURGICAL .5 X.5 (GAUZE/BANDAGES/DRESSINGS) ×2 IMPLANT
PATTIES SURGICAL .5 X1 (DISPOSABLE) ×4 IMPLANT
SPONGE LAP 4X18 X RAY DECT (DISPOSABLE) ×10 IMPLANT
SPONGE SURGIFOAM ABS GEL 100 (HEMOSTASIS) ×2 IMPLANT
STRIP CLOSURE SKIN 1/2X4 (GAUZE/BANDAGES/DRESSINGS) ×2 IMPLANT
SURGIFLO W/THROMBIN 8M KIT (HEMOSTASIS) ×2 IMPLANT
SUT BONE WAX W31G (SUTURE) ×3 IMPLANT
SUT MNCRL AB 3-0 PS2 18 (SUTURE) IMPLANT
SUT MON AB 3-0 SH 27 (SUTURE) ×3
SUT MON AB 3-0 SH27 (SUTURE) ×1 IMPLANT
SUT VIC AB 0 CT1 27 (SUTURE) ×3
SUT VIC AB 0 CT1 27XBRD ANBCTR (SUTURE) ×1 IMPLANT
SUT VIC AB 1 CT1 18XCR BRD 8 (SUTURE) IMPLANT
SUT VIC AB 1 CT1 8-18 (SUTURE) ×6
SUT VIC AB 1 CTX 36 (SUTURE)
SUT VIC AB 1 CTX36XBRD ANBCTR (SUTURE) ×2 IMPLANT
SUT VIC AB 2-0 CT1 18 (SUTURE) ×5 IMPLANT
SYR BULB IRRIGATION 50ML (SYRINGE) ×3 IMPLANT
SYR CONTROL 10ML LL (SYRINGE) ×3 IMPLANT
TOWEL OR 17X24 6PK STRL BLUE (TOWEL DISPOSABLE) ×3 IMPLANT
TOWEL OR 17X26 10 PK STRL BLUE (TOWEL DISPOSABLE) ×3 IMPLANT
WATER STERILE IRR 1000ML POUR (IV SOLUTION) ×3 IMPLANT
YANKAUER SUCT BULB TIP NO VENT (SUCTIONS) ×3 IMPLANT

## 2015-08-14 NOTE — Anesthesia Preprocedure Evaluation (Signed)
Anesthesia Evaluation  Patient identified by MRN, date of birth, ID band Patient awake    Reviewed: Allergy & Precautions, NPO status , Patient's Chart, lab work & pertinent test results  Airway Mallampati: II  TM Distance: >3 FB Neck ROM: Full    Dental no notable dental hx.    Pulmonary Current Smoker,  breath sounds clear to auscultation  Pulmonary exam normal       Cardiovascular hypertension, Pt. on medications Normal cardiovascular examRhythm:Regular Rate:Normal     Neuro/Psych Bipolar Disorder negative neurological ROS     GI/Hepatic negative GI ROS, Neg liver ROS,   Endo/Other  diabetes  Renal/GU negative Renal ROS  negative genitourinary   Musculoskeletal negative musculoskeletal ROS (+)   Abdominal   Peds negative pediatric ROS (+)  Hematology negative hematology ROS (+)   Anesthesia Other Findings   Reproductive/Obstetrics negative OB ROS                             Anesthesia Physical Anesthesia Plan  ASA: III  Anesthesia Plan: General   Post-op Pain Management:    Induction: Intravenous  Airway Management Planned: Oral ETT  Additional Equipment:   Intra-op Plan:   Post-operative Plan: Extubation in OR  Informed Consent: I have reviewed the patients History and Physical, chart, labs and discussed the procedure including the risks, benefits and alternatives for the proposed anesthesia with the patient or authorized representative who has indicated his/her understanding and acceptance.   Dental advisory given  Plan Discussed with: CRNA and Surgeon  Anesthesia Plan Comments:         Anesthesia Quick Evaluation

## 2015-08-14 NOTE — Anesthesia Procedure Notes (Signed)
Procedure Name: Intubation Date/Time: 08/14/2015 1:11 PM Performed by: Vennie Homans Patient Re-evaluated:Patient Re-evaluated prior to inductionOxygen Delivery Method: Circle system utilized Preoxygenation: Pre-oxygenation with 100% oxygen Intubation Type: IV induction Ventilation: Mask ventilation without difficulty Laryngoscope Size: Mac and 3 Grade View: Grade I Tube size: 7.5 mm Number of attempts: 1 Airway Equipment and Method: Stylet Placement Confirmation: ETT inserted through vocal cords under direct vision,  positive ETCO2 and breath sounds checked- equal and bilateral Secured at: 21 cm Tube secured with: Tape Dental Injury: Teeth and Oropharynx as per pre-operative assessment

## 2015-08-14 NOTE — Transfer of Care (Signed)
Immediate Anesthesia Transfer of Care Note  Patient: Maraki W File  Procedure(s) Performed: Procedure(s): LUMBAR DECOMPRESSION MICRODISCECTOMY L3-S1 (N/A)  Patient Location: PACU  Anesthesia Type:General  Level of Consciousness: awake, alert  and oriented  Airway & Oxygen Therapy: Patient Spontanous Breathing and Patient connected to face mask oxygen  Post-op Assessment: Report given to RN and Post -op Vital signs reviewed and stable  Post vital signs: Reviewed and stable  Last Vitals:  Filed Vitals:   08/14/15 1734  BP:   Pulse:   Temp: 37.1 C  Resp:     Complications: No apparent anesthesia complications

## 2015-08-14 NOTE — Brief Op Note (Signed)
08/14/2015  4:53 PM  PATIENT:  Elizabeth Thompson  64 y.o. female  PRE-OPERATIVE DIAGNOSIS:  SPINAL STENOSIS L3-S1, RIGHT LEG PAIN  POST-OPERATIVE DIAGNOSIS:  SPINAL STENOSIS L3-S1, RIGHT LEG PAIN  PROCEDURE:   L3-S1 posterior decompression & disectomy.    SURGEON:  Surgeon(s) and Role:    * Melina Schools, MD - Primary  PHYSICIAN ASSISTANT:   ASSISTANTS: Carmen Mayo   ANESTHESIA:   general  EBL:  Total I/O In: 2000 [I.V.:2000] Out: 350 [Urine:250; Blood:100]  BLOOD ADMINISTERED:none  DRAINS: 1 hemovac    LOCAL MEDICATIONS USED:  MARCAINE     SPECIMEN:  No Specimen  DISPOSITION OF SPECIMEN:  N/A  COUNTS:  YES  TOURNIQUET:  * No tourniquets in log *  DICTATION: .Other Dictation: Dictation Number [id number]  PLAN OF CARE: Admit to inpatient   PATIENT DISPOSITION:  PACU - hemodynamically stable.

## 2015-08-14 NOTE — Anesthesia Postprocedure Evaluation (Signed)
  Anesthesia Post-op Note  Patient: Elizabeth Thompson  Procedure(s) Performed: Procedure(s): LUMBAR DECOMPRESSION MICRODISCECTOMY L3-S1 (N/A)  Patient Location: PACU  Anesthesia Type:General  Level of Consciousness: awake, alert , oriented and patient cooperative  Airway and Oxygen Therapy: Patient Spontanous Breathing and Patient connected to nasal cannula oxygen  Post-op Pain: none  Post-op Assessment: Post-op Vital signs reviewed, Patient's Cardiovascular Status Stable, Respiratory Function Stable, Patent Airway, No signs of Nausea or vomiting and Pain level controlled LLE Motor Response: Responds to commands   RLE Motor Response: Responds to commands        Post-op Vital Signs: Reviewed and stable  Last Vitals:  Filed Vitals:   08/14/15 1830  BP: 101/46  Pulse: 98  Temp:   Resp: 17    Complications: No apparent anesthesia complications

## 2015-08-14 NOTE — H&P (Signed)
History of Present Illness The patient is a 64 year old female who presents with back pain. The patient reports low back symptoms including pain which began 3 year(s) ago without any known injury. Symptoms are reported to be located symmetrically and Symptoms include pain (right low back , down the right lwg to the ankle), decreased range of motion, paresthesias (right leg) and weakness (right leg), while symptoms do not include numbness, incontinence of stool, incontinence of urine or foot drop. The pain radiates to the right buttock, right thigh, right lateral thigh and right lower leg. The patient describes the pain as sharp. The patient describes the severity of their symptoms as severe (rating her pain right now at 12/10). The patient feels as if the symptoms are worsening. Symptoms are exacerbated by standing (the action of standing up and standing up). Symptoms are relieved by rest, recumbency and opioid analgesics (Oxycodone 5/325 , Tylenol, Ephraim Hamburger and Capsation "Dr Rolena Infante Rx'd a limited amount"). Associated symptoms do not include rash, fever, suprapubic pain, urinary frequency, hematuria, dysuria, abdominal pain, weight loss, urinary incontinence or fecal incontinence. Current treatment includes opioid analgesics. Pertinent medical history does not include previous back surgery, fibromyalgia, depression, malignancy or renal lithiasis. Prior to being seen today the patient was previously evaluated in this clinic. Symptoms present at the patient's previous evaluation included back pain. Past evaluation has included MRI of the lumbar spine, orthopedic evaluation and pain management evaluation. Past treatment has included opioid analgesics, epidural injections and physical therapy.  Allergies PredniSONE *CORTICOSTEROIDS* increased sugars Codeine/Codeine Derivatives  Family History  Heart Disease Mother. Hypertension Maternal Grandfather, Mother. Osteoarthritis Mother. Diabetes Mellitus  Father, Mother. Rheumatoid Arthritis mother Cerebrovascular Accident Maternal Grandfather. Depression Mother.  Social History  Tobacco use Current every day smoker. 05/02/2014: smoke(d) 1 pack(s) per day current every day smoker No history of drug/alcohol rehab Marital status divorced Not under pain contract Number of flights of stairs before winded 1 2-3 Living situation live with caregiver Children 1 Pain Contract no Current drinker 05/02/2014: Currently drinks only occasionally per week Exercise Exercises weekly; does running / walking Exercises daily Current work status disabled  Medication History  Percocet (5-325MG Tablet, 1 (one) Oral three times daily, as needed, Taken starting 07/23/2015) Active. (ddb/rcy) Roller Walker (1 (one) Misc for daily use. Patient needs one with a seat, Taken starting 05/21/2015) Active. Abilify (20MG Tablet, Oral) Active. LamoTRIgine (100MG Tablet, Oral) Active. Aricept (5MG Tablet, Oral) Active. HydrOXYzine HCl (25MG Tablet, Oral) Active. Remeron (Oral) Specific dose unknown - Active. MetFORMIN HCl (1000MG Tablet, Oral) Active. (1 gram bid) Amaryl (4MG Tablet, Oral) Active. (qd) Lisinopril (40MG Tablet, Oral) Active. (qd) Atorvastatin Calcium (40MG Tablet, Oral) Active. (qd) Multiple Vitamin (Oral) Active. (qd) Latanoprost (0.005% Solution, Ophthalmic) Active. Methocarbamol (500MG Tablet, Oral) Active. Vitamin D (Ergocalciferol) (50000UNIT Capsule, Oral) Active. Selenium Sulf-Pyrithione-Urea (2.25% Shampoo, External) Active. Medications Reconciled Physical Exam (Carmen C. Mayo PA-C; 08/09/2015 8:30 AM) General General Appearance-Not in acute distress. Orientation-Oriented X3. Build & Nutrition-Well nourished and Well developed.  Integumentary General Characteristics Surgical Scars - no surgical scar evidence of previous lumbar surgery. Lumbar Spine-Skin examination of the lumbar spine is without  deformity, skin lesions, lacerations or abrasions.  Chest and Lung Exam Auscultation Breath sounds - Normal and Clear.  Cardiovascular Auscultation Rhythm - Regular rate and rhythm.  Abdomen Palpation/Percussion Palpation and Percussion of the abdomen reveal - Soft, Non Tender and No Rebound tenderness.  Peripheral Vascular Lower Extremity Palpation - Posterior tibial pulse - Bilateral - 2+. Dorsalis pedis  pulse - Bilateral - 2+.  Neurologic Sensation Lower Extremity - Bilateral - sensation is intact in the lower extremity. Reflexes Patellar Reflex - Bilateral - 2+. Achilles Reflex - Bilateral - 2+. Clonus - Bilateral - clonus not present. Hoffman's Sign - Bilateral - Hoffman's sign not present. Testing Seated Straight Leg Raise - Right - Seated straight leg raise positive.  Musculoskeletal Spine/Ribs/Pelvis  Lumbosacral Spine: Inspection and Palpation - Tenderness - no soft tissue tenderness to palpation and no bony tenderness to palpation, bony and soft tissue palpation of the lumbar spine and SI joint does not recreate their typical pain. Strength and Tone: Strength - Hip Flexion - Left - 5/5. Right - 3+/5. Knee Extension - Left - 5/5. Right - 4-/5. Knee Flexion - Bilateral - 5/5. Ankle Dorsiflexion - Left - 5/5. Right - 4-/5. Ankle Plantarflexion - Left - 5/5. Right - 4-/5. Heel walk - Bilateral - unable to heel walk. Toe Walk - Bilateral - unable to walk on toes. Heel-Toe Walk - Bilateral - able to heel-toe walk with moderate difficulty. ROM - Flexion - moderately decreased range of motion and painful. Extension - moderately decreased range of motion. Left Lateral Bending - moderately decreased range of motion and painful. Right Lateral Bending - moderately decreased range of motion and painful. Pain - flexion is more painful than extension. Lumbosacral Spine - Waddell's Signs - no Waddell's signs present. Lower Extremity Range of Motion - No true hip, knee or ankle pain with range  of motion. Gait and [place] - wheelchair.  Objective Transcription She is a pleasant woman appears his stated age, in no acute distress. She is alert. She is oriented x3. No shortness of breath or chest pain. The abdomen is soft and nontender. She is having significant right radicular leg pain. Positive straight leg raise test diffuse 4/5 EHL, tibialis anterior, gastrocnemius, hip, and quad strength on the right side compared to the left. Negative Babinski test. No clonus. Symmetrical deep tendon reflexes. Diminished sensation in the L3, L4, L5 and S1 dermatomes on the right. Significant back pain with extension of the spine leak with forward flexion, limited mobility because of the right leg weakness and pain.  Her MRI was reviewed from June, which showed three-level spinal stenosis L3-L4, L4-L5, L5-S1 with disc herniation on the right side causing increased lateral recess stenosis.  Assessment & Plan Goal Of Surgery:Discussed that goal of surgery is to reduce pain and improve function and quality of life. Patient is aware that despite all appropriate treatment that there pain and function could be the same, worse, or different. Posterior Lumbar Decompression/disectomy: Risks of surgery include infection, bleeding, nerve damage, death, stroke, paralysis, failure to heal, need for further surgery, ongoing or worse pain, need for further surgery, CSF leak, loss of bowel or bladder, and recurrent disc herniation or Stenosis which would necessitate need for further surgery.   Plans Transcription At this point, we had a long discussion she liked to proceed with surgery. I think the best course of action is just doing a three-level decompression, L3-S1, which allow me to address her spinal stenosis as well as the disc herniation. I do this slowly on the right hand side as she is having mostly just right radicular leg pain. The goal of surgery is to address her neuropathic leg pain. Risks  include infection, bleeding, nerve damage, death, stroke, paralysis, failure to heal, need for further surgery, ongoing worse pain, loss of bowel and bladder control, need for further  surgery. All of her questions were addressed. We will plan on surgery after we get preoperative medical clearance in the near future.

## 2015-08-15 ENCOUNTER — Encounter (HOSPITAL_COMMUNITY): Payer: Self-pay | Admitting: Orthopedic Surgery

## 2015-08-15 LAB — HEMOGLOBIN A1C
Hgb A1c MFr Bld: 6.9 % — ABNORMAL HIGH (ref 4.8–5.6)
MEAN PLASMA GLUCOSE: 151 mg/dL

## 2015-08-15 LAB — GLUCOSE, CAPILLARY
GLUCOSE-CAPILLARY: 116 mg/dL — AB (ref 65–99)
GLUCOSE-CAPILLARY: 150 mg/dL — AB (ref 65–99)
GLUCOSE-CAPILLARY: 185 mg/dL — AB (ref 65–99)
Glucose-Capillary: 175 mg/dL — ABNORMAL HIGH (ref 65–99)
Glucose-Capillary: 192 mg/dL — ABNORMAL HIGH (ref 65–99)
Glucose-Capillary: 112 mg/dL — ABNORMAL HIGH (ref 65–99)
Glucose-Capillary: 133 mg/dL — ABNORMAL HIGH (ref 65–99)

## 2015-08-15 MED ORDER — IPRATROPIUM-ALBUTEROL 0.5-2.5 (3) MG/3ML IN SOLN
3.0000 mL | Freq: Three times a day (TID) | RESPIRATORY_TRACT | Status: DC
Start: 2015-08-16 — End: 2015-08-19
  Administered 2015-08-16 – 2015-08-19 (×10): 3 mL via RESPIRATORY_TRACT
  Filled 2015-08-15 (×10): qty 3

## 2015-08-15 MED ORDER — OXYCODONE HCL 5 MG PO TABS
15.0000 mg | ORAL_TABLET | ORAL | Status: DC | PRN
  Administered 2015-08-15 – 2015-08-18 (×5): 15 mg via ORAL
  Filled 2015-08-15 (×6): qty 3

## 2015-08-15 NOTE — Evaluation (Signed)
Physical Therapy Evaluation Patient Details Name: Elizabeth Thompson MRN: [id number] DOB: 03-03-1951 Today's Date: 08/15/2015   History of Present Illness  Pt is a 64 y/o F dx w/ lumbar spinal stenosis w/ disc herniations L3-S1 Rt side.  Pt is now s/p lumbar decompression microdiscectomy L3-S1.  [name] PMH inlcudes HTN, bell's palsy, glaucoma, schizo-affective psychosis, memory disorder, bipolar disorder, frequency of urination, DM.   Clinical Impression  Patient is s/p above surgery resulting in functional limitations due to the deficits listed below (see PT Problem List). Per MD verbal order, OK to complete thearpy evaluation and ambulate w/o brace.  Mrs. Duque lives alone w/ her niece who is her primary caregiver.  Niece is not available to assist pt 24/7 at d/c and therefore recommending SNF upon d/c at pt currently requires +2 assist for safety and physical assist w/ mobility. Patient will benefit from skilled PT to increase their independence and safety with mobility to allow discharge to the venue listed below.      Follow Up Recommendations SNF;Supervision/Assistance - 24 hour    Equipment Recommendations  Other (comment) (TBD at next venue of care)    Recommendations for Other Services       Precautions / Restrictions Precautions Precautions: Fall;Back Precaution Booklet Issued: Yes (comment) Precaution Comments: Reviewed all back precautions Required Braces or Orthoses: Other Brace/Splint Other Brace/Splint: Lumbar brace donned in sitting Restrictions Weight Bearing Restrictions: No      Mobility  Bed Mobility               General bed mobility comments: Pt sitting EOB w/ OT upon PT arrival, please see OT note for bed mobility  Transfers Overall transfer level: Needs assistance Equipment used: Rolling walker (2 wheeled) Transfers: Stand Pivot Transfers;Sit to/from Stand Sit to Stand: Mod assist Stand pivot transfers: Min assist       General transfer comment:  Mod assist to power up to standing and to steady RW.  Pt required multiple attempts to stand 2/2 lethargy and generalized weakness.  Pt saying, "I need to push through my legs".  [name] eyes remain closed during transfers.  Min assist during stand pivot transfer w/ RW to navigate and to maintain stability.  Cues for hand placement w/ well controlled descent to chair.  Ambulation/Gait             General Gait Details: deferred this session 2/2 pt's severe lethargy and Bil LE weakness.  Stairs            Wheelchair Mobility    Modified Rankin (Stroke Patients Only)       Balance Overall balance assessment: Needs assistance;History of Falls Sitting-balance support: Feet supported;Bilateral upper extremity supported Sitting balance-Leahy Scale: Poor Sitting balance - Comments: Close min guard 2/2 pt's lethargic state sitting EOB   Standing balance support: Bilateral upper extremity supported;During functional activity Standing balance-Leahy Scale: Poor Standing balance comment: Requires support of RW and assist to maintain upright                             Pertinent Vitals/Pain Pain Assessment: Faces Faces Pain Scale: Hurts even more Pain Location: back Pain Descriptors / Indicators: Grimacing;Guarding;Moaning Pain Intervention(s): Limited activity within patient's tolerance;Monitored during session;Repositioned    Home Living Family/patient expects to be discharged to:: Skilled nursing facility Living Arrangements: Other relatives (Niece is her primary caregiver)               Additional  Comments: Lives alone w/ niece who is her primary caregiver.  Niece works on the weekends and every other Friday and is at home alone at these times.    Prior Function Level of Independence: Needs assistance   Gait / Transfers Assistance Needed: Per niece pt required +1 assist PTA for all transfers and could only ambulate a few feet before her knees would give out.     ADL's / Homemaking Assistance Needed: Per niece pt required assist w/ shower/tub transfers and washing her back.  She also required assist w/ dressing.        Hand Dominance        Extremity/Trunk Assessment   Upper Extremity Assessment: Generalized weakness;Defer to OT evaluation           Lower Extremity Assessment: Generalized weakness;RLE deficits/detail;LLE deficits/detail RLE Deficits / Details: grossly 3/5 strength w/ MMT; difficult to determine reliability as pt is severely lethargic LLE Deficits / Details: grossly 3/5 strength w/ MMT; difficult to determine reliability as pt is severely lethargic  Cervical / Trunk Assessment: Other exceptions  Communication   Communication: No difficulties  Cognition Arousal/Alertness: Lethargic;Suspect due to medications Behavior During Therapy: Flat affect Overall Cognitive Status: Impaired/Different from baseline Area of Impairment: Attention;Following commands;Safety/judgement;Awareness   Current Attention Level: Sustained   Following Commands: Follows one step commands with increased time Safety/Judgement: Decreased awareness of safety;Decreased awareness of deficits Awareness: Emergent   General Comments: Pt keeps her eyes close throughout session unless asked to open them.  She has difficulty remaining focused on task at hand.  Pt very lethargic, niece says this is not her baseline.    General Comments General comments (skin integrity, edema, etc.): Per MD verbal order, OK to complete thearpy evaluation and ambulate w/o brace    Exercises General Exercises - Lower Extremity Ankle Circles/Pumps: AROM;Both;10 reps;Seated Quad Sets: Strengthening;Both;5 reps;Seated Gluteal Sets: Strengthening;Both;Seated;5 reps      Assessment/Plan    PT Assessment Patient needs continued PT services  PT Diagnosis Difficulty walking;Abnormality of gait;Generalized weakness;Acute pain   PT Problem List Decreased strength;Decreased  range of motion;Decreased activity tolerance;Decreased balance;Decreased mobility;Decreased knowledge of use of DME;Decreased safety awareness;Decreased knowledge of precautions;Decreased cognition;Pain;Decreased skin integrity  PT Treatment Interventions DME instruction;Gait training;Functional mobility training;Therapeutic activities;Therapeutic exercise;Balance training;Neuromuscular re-education;Cognitive remediation;Patient/family education;Modalities;Wheelchair mobility training   PT Goals (Current goals can be found in the Care Plan section) Acute Rehab PT Goals Patient Stated Goal: none stated PT Goal Formulation: With patient/family Time For Goal Achievement: 08/29/15 Potential to Achieve Goals: Good    Frequency Min 5X/week   Barriers to discharge Inaccessible home environment;Decreased caregiver support Steps to enter home and will not have 24/7 assist available    Co-evaluation               End of Session   Activity Tolerance: Patient limited by lethargy Patient left: in chair;with call bell/phone within reach;with family/visitor present Nurse Communication: Mobility status;Precautions;Other (comment) (pt is very lethargic)         Time: 0929-5747 PT Time Calculation (min) (ACUTE ONLY): 24 min   Charges:   PT Evaluation $Initial PT Evaluation Tier I: 1 Procedure PT Treatments $Therapeutic Activity: 8-22 mins   PT G Codes:       Joslyn Hy PT, DPT [phone] Pager: [phone] 08/15/2015, 4:10 PM

## 2015-08-15 NOTE — Op Note (Signed)
NAMEKATHERINA, Thompson             ACCOUNT NO.:  [id number]  MEDICAL RECORD NO.:  [id number]  LOCATION:  5N26C                        FACILITY:  [facility]  PHYSICIAN:  Kaithlyn Teagle D. Rolena Infante, M.D. DATE OF BIRTH:  Jun 26, 1951  DATE OF PROCEDURE:  08/14/2015 DATE OF DISCHARGE:                              OPERATIVE REPORT   POSTOPERATIVE DIAGNOSIS:  Lumbar spinal stenosis with disk herniations, L3-4, L4-5, L5-S1, right side.  POSTOPERATIVE DIAGNOSIS:  Lumbar spinal stenosis with disk herniations, L3-4, L4-5, L5-S1, right side.  OPERATIVE PROCEDURES:  Central and posterolateral to the right decompression with L5-L4 hemilaminectomy and L3 hemilaminotomy, right side, with lateral recess decompression foraminotomy and diskectomies.  COMPLICATIONS:  None.  CONDITION:  Stable.  FIRST ASSISTANT:  Ronette Deter, P.A.  HISTORY:  This is a very pleasant 64 year old Elizabeth Thompson with severe right leg pain, weakness, and inability to function.  Functioning quality of life continued to deteriorate despite appropriate conservative care.  As a result, we elected to proceed with surgery.  All appropriate risks, benefits, and alternatives were discussed with the patient and consent was obtained.  OPERATIVE NOTE:  The patient was brought to the operating room, placed supine on the operating table.  After successful induction of general anesthesia and endotracheal intubation, TEDs, SCDs, and Foley were inserted.  She was turned prone onto the Capitan frame.  All bony prominences were well padded and the back was prepped and draped in a standard fashion.  Time-out was taken to confirm the patient, procedure, and all other pertinent important data.  A midline incision was made and sharp dissection was carried out down to the deep fascia.  I exposed the spinous process of L3, L4, L5 and a portion of that of S1.  I then placed a [place] 4 underneath the lamina of L4, took an x-ray, and confirmed that I was at the L4  lamina.  I then exposed both sides using a Cobb elevator and Bovie, so I had a complete posterior decompression out to the level of the facet complexes.  I then released the ligamentum flavum from underneath the lamina of L5 and used a 2 and 3 and 4 mm Kerrison punches to perform a generous laminotomy of L5.  I performed a similar one at L4 and L3.  The ligamentum flavum was left intact.  At this point, once I had the decompressions, the laminotomies completed, I then released the ligamentum flavum using a curette from the leading edge of S1.  I trimmed this down and then removed the ligamentum flavum. I then continued my dissection underneath the spinous process of L5 centrally.  At this point, I could freely get a [facility] onto the contralateral side.  I then palpated into the lateral recess and continued to decompress out into the lateral recess using 3 and 2 mm Kerrison punches.  I identified the S1 nerve root and traced it towards the foramen and trimmed and performed a foraminotomy as well.  Once I had this completed, I then identified the L5-S1 disk space and disk herniation.  An annulotomy was performed with a 15 blade scalpel and then using a combination of pituitary rongeurs, curettes, and Epstein curettes, I performed an adequate  diskectomy.  There were 4 fragments of disk material that I was able to remove, and I was able to use my Epstein to trim down the significant osteophytes that had formed.  At this point, the S1 nerve root was completely free of any tension.  I could freely pass my [facility] underneath the S1 nerve root and into the foramen that I could palpate superiorly and palpated into the L5 foramen.  I had a complete central decompression and right lateral recess decompression and foraminotomy.  I then, using the same technique, removed the ligamentum flavum at the L4-5 interspace.  I then placed a neural patty underneath the remaining L5 lamina and  then used my 4 mm Kerrison and 3 mm Kerrison to complete the hemilaminectomy of L5.  At this point, I had a complete posterior decompression from L4 to S1.  I again, using the same technique, performed the diskectomy and lateral recess decompression at this level.  I then continued at L3, again releasing the ligamentum flavum and removing this and then completing the L4 hemilaminectomy.  At this point, I then also performed a lateral recess decompression in a similar fashion as well as the diskectomy in a similar fashion.  At this point, I then took an x-ray, confirming that I was spanning the stenotic levels that were seen on the preoperative MRI.  I had an adequate foraminotomy of L3, L4, L5, and S1. I had adequate lateral recess decompression from L3 to S1 as well as centrally.  At all levels, I could freely pass my [facility] elevator underneath the thecal sac centrally into the lateral recess superiorly and inferiorly.  At this point, I irrigated the wound copiously with normal saline and I obtained hemostasis using FloSeal as well as bipolar electrocautery.  I then placed a thrombin-soaked Gelfoam patty over the exposed thecal sac.  Because of her age and the duration of the procedure, I elected to place vancomycin-impregnated antibiotic beads as well as a drain.  I then closed the deep fascia over the antibiotic beads with interrupted #1 Vicryl sutures, placed more beads in the superficial, and then closed with 2-0 Vicryl sutures and 3-0 Monocryl. At the end of the case, all needle and sponge counts were correct. There were no adverse intraoperative events.  The patient was ultimately extubated and transferred to the PACU without incident.     Carlon Chaloux D. Rolena Infante, M.D.     DDB/MEDQ  D:  08/14/2015  T:  08/15/2015  Job:  [id number]  cc:   Duane Lope D. Rolena Infante, M.D.'s Office

## 2015-08-15 NOTE — Progress Notes (Signed)
PT Cancellation Note  Patient Details Name: Elizabeth Thompson MRN: [id number] DOB: [birth year]   Cancelled Treatment:    Reason Eval/Treat Not Completed: Other (comment) (Awaiting brace delivery).  Called ortho tech who reported they will deliver her lumbar corset.  Will complete evaluation once brace delivered and fit to pt.  Thank you for this order.  Joslyn Hy PT, DPT [phone] Pager: [phone] 08/15/2015, 2:19 PM

## 2015-08-15 NOTE — Progress Notes (Addendum)
Patient ID: Elizabeth Thompson, female   DOB: Jan 01, 1951, 64 y.o.   MRN: [id number]    Subjective: 1 Day Post-Op Procedure(s) (LRB): LUMBAR DECOMPRESSION MICRODISCECTOMY L3-S1 (N/A) Patient reports pain as 9 on 0-10 scale.   Denies CP or SOB.  Voiding without difficulty. Positive flatus. No BM.  Objective: Vital signs in last 24 hours: Temp:  [98.6 F (37 C)-98.9 F (37.2 C)] 98.6 F (37 C) (09/01 0425) Pulse Rate:  [95-104] 95 (09/01 0425) Resp:  [15-21] 17 (09/01 0425) BP: (96-140)/(45-74) 96/50 mmHg (09/01 0425) SpO2:  [92 %-100 %] 98 % (09/01 0425) Weight:  [89.401 kg (197 lb 1.5 oz)] 89.401 kg (197 lb 1.5 oz) (08/31 1153)  Intake/Output from previous day: 08/31 0701 - 09/01 0700 In: 2635 [P.O.:120; I.V.:2200; IV Piggyback:250] Out: 1300 [Urine:1175; Drains:25; Blood:100] Intake/Output this shift:    Labs: No results for input(s): HGB in the last 72 hours. No results for input(s): WBC, RBC, HCT, PLT in the last 72 hours. No results for input(s): NA, K, CL, CO2, BUN, CREATININE, GLUCOSE, CALCIUM in the last 72 hours. No results for input(s): LABPT, INR in the last 72 hours.  Physical Exam: Neurologically intact ABD soft Sensation intact distally Intact pulses distally Dorsiflexion/Plantar flexion intact Incision: dressing C/D/I Compartment soft  Assessment/Plan: 1 Day Post-Op Procedure(s) (LRB): LUMBAR DECOMPRESSION MICRODISCECTOMY L3-S1 (N/A) Advance diet Up with therapy  DC Percocet 10-325 Started Oxycodone 63m q4 prn severe post op pain No plan for DC today Pt would like to be considered for Rehab facility  Mayo, CDarla Leschesfor Dr. DMelina SchoolsGGood [facility] ([phone]/12/2014, 8:12 AM    Doing well overall Will start therapy (ambulation) Neuro intact Continue care Possible d/c Saturday - question SNF vs home with services

## 2015-08-15 NOTE — Evaluation (Signed)
Occupational Therapy Evaluation Patient Details Name: Elizabeth Thompson MRN: [id number] DOB: 1951/05/08 Today's Date: 08/15/2015    History of Present Illness Pt is a 64 y/o F dx w/ lumbar spinal stenosis w/ disc herniations L3-S1 Rt side.  Pt is now s/p lumbar decompression microdiscectomy L3-S1.  [name] PMH inlcudes HTN, bell's palsy, glaucoma, schizo-affective psychosis, memory disorder, bipolar disorder, frequency of urination, DM.    Clinical Impression   Patient presenting with deconditioning, decreased I in self care, acute pain, decreased strength, decreased cognition ( possibly from medications), decreased balance, and decreased functional mobility/transfers.  Patient required min A  PTA per caregiver. Her caregiver, her niece, provided assist as needed and lives with pt but is unavailable during certain times/days and would not be able to provide 24/7 care at discharge. Patient currently functioning total A. Patient will benefit from acute OT to increase overall independence in the areas of ADLs, functional mobility, safety, pt education in order to safely discharge to SNF for additional rehab services.    Follow Up Recommendations  SNF    Equipment Recommendations  Other (comment) (defer to next venue of care)    Recommendations for Other Services       Precautions / Restrictions Precautions Precautions: Fall;Back Precaution Booklet Issued: Yes (comment) Precaution Comments: Reviewed all back precautions Required Braces or Orthoses: Other Brace/Splint Other Brace/Splint: Lumbar brace donned in sitting. Verbal order given per Dr. Rolena Infante to perform eval without brace but maintain back precautions. Restrictions Weight Bearing Restrictions: No      Mobility Bed Mobility Overal bed mobility: +2 for physical assistance             General bed mobility comments: Pt required total A for supine >sit in order to maintain back precautions with log roll this session. Assist with  trunk and B LEs to come into full sit .   Transfers Overall transfer level: Needs assistance Equipment used: Rolling walker (2 wheeled) Transfers: Stand Pivot Transfers;Sit to/from Stand Sit to Stand: Max assist Stand pivot transfers: Min assist       General transfer comment: Max A transfer from edge of bed with multiple attempts to come into standing position and pt requiring assist for lifting and lowering this session. Pt required max verbal cues for hand placement on RW and proper technique this session. Once standing pt able to march in place 4 times on each foot before returning to seated  position. PT arriving at this time and pt transitioning easily into PT evaluation as therapist exited the room.     Balance Overall balance assessment: Needs assistance;History of Falls Sitting-balance support: Feet supported;Bilateral upper extremity supported Sitting balance-Leahy Scale: Poor Sitting balance - Comments: min A - min guard as pt is lethargic sitting EOB with eyes closed at times.    Standing balance support: Bilateral upper extremity supported;During functional activity Standing balance-Leahy Scale: Poor Standing balance comment: support of RW and assist to maintain balance.                            ADL Overall ADL's : Needs assistance/impaired                                       General ADL Comments: Pt is currently total A for LB self care and would require min A with balance while pt washes UB. Pt  very lethargic during session and having difficult keeping eyes open and following 1 step verbal commands.                Pertinent Vitals/Pain Pain Assessment: 0-10 Pain Score: 9  Faces Pain Scale: Hurts even more Pain Location: back Pain Descriptors / Indicators: Guarding;Crying;Moaning;Aching Pain Intervention(s): Limited activity within patient's tolerance;Monitored during session;Repositioned     Hand Dominance Right    Extremity/Trunk Assessment Upper Extremity Assessment Upper Extremity Assessment: Generalized weakness;RUE deficits/detail;LUE deficits/detail RUE Deficits / Details: 3/5 grossly but difficulty to formally assess secondary to pt being severely lethargic LUE Deficits / Details: 3/5 grossly but difficulty to formally assess secondary to pt being severely lethargic   Lower Extremity Assessment Lower Extremity Assessment: Generalized weakness;RLE deficits/detail;LLE deficits/detail RLE Deficits / Details: grossly 3/5 strength w/ MMT; difficult to determine reliability as pt is severely lethargic RLE Sensation:  (WNL w/ dermatomal testing) LLE Deficits / Details: grossly 3/5 strength w/ MMT; difficult to determine reliability as pt is severely lethargic LLE Sensation:  (WNL w/ dermatomal testing)   Cervical / Trunk Assessment Cervical / Trunk Assessment: Other exceptions Cervical / Trunk Exceptions: s/p back surgery   Communication Communication Communication: No difficulties   Cognition Arousal/Alertness: Lethargic;Suspect due to medications Behavior During Therapy: Flat affect Overall Cognitive Status: Impaired/Different from baseline Area of Impairment: Attention;Following commands;Safety/judgement;Awareness   Current Attention Level: Sustained   Following Commands: Follows one step commands with increased time Safety/Judgement: Decreased awareness of safety;Decreased awareness of deficits Awareness: Emergent   General Comments: Pt keeps her eyes close throughout session unless asked to open them.  She has difficulty remaining focused on task at hand.  Pt very lethargic, niece says this is not her baseline.              Home Living Family/patient expects to be discharged to:: Skilled nursing facility Living Arrangements: Other relatives           Additional Comments: Lives alone w/ niece who is her primary caregiver.  Niece works on the weekends and every other Friday and  is at home alone at these times.      Prior Functioning/Environment Level of Independence: Needs assistance  Gait / Transfers Assistance Needed: Per niece pt required +1 assist PTA for all transfers and could only ambulate a few feet before her knees would give out.   ADL's / Homemaking Assistance Needed: Per niece pt required assist w/ shower/tub transfers and washing her back.  She also required assist w/ dressing.        OT Diagnosis: Generalized weakness;Acute pain   OT Problem List: Decreased strength;Decreased activity tolerance;Decreased cognition;Decreased knowledge of use of DME or AE;Pain;Decreased safety awareness;Decreased knowledge of precautions;Impaired balance (sitting and/or standing)   OT Treatment/Interventions: Self-care/ADL training;Energy conservation;Balance training;Therapeutic exercise;DME and/or AE instruction;Therapeutic activities;Patient/family education    OT Goals(Current goals can be found in the care plan section) Acute Rehab OT Goals Patient Stated Goal: to do better OT Goal Formulation: With patient Time For Goal Achievement: 08/29/15 Potential to Achieve Goals: Fair ADL Goals Pt Will Perform Upper Body Bathing: sitting;with modified independence Pt Will Perform Lower Body Bathing: with min assist;sit to/from stand;with adaptive equipment Pt Will Perform Upper Body Dressing: with modified independence;sitting Pt Will Perform Lower Body Dressing: sit to/from stand;with min assist;with adaptive equipment Pt Will Transfer to Toilet: with min guard assist;bedside commode;ambulating Pt Will Perform Toileting - Clothing Manipulation and hygiene: with min guard assist;sit to/from stand;with adaptive equipment Additional ADL Goal #1: Pt will  verbalize 3/3 back precautions with increased time in order to demonstrate knowledge of restrictions.  OT Frequency: Min 2X/week   Barriers to D/C: Other (comment)  caregiver is not available 24/7 and OT anticipates pt  will need 24/7 assistance and supervision at discharge          End of Session Equipment Utilized During Treatment: Rolling walker  Activity Tolerance: Patient limited by lethargy;Patient limited by pain;Patient limited by fatigue Patient left: Other (comment) (seated on EOB and transitioning to PT evaluation)   Time: 1025-4862 OT Time Calculation (min): 28 min Charges:  OT General Charges $OT Visit: 1 Procedure OT Evaluation $Initial OT Evaluation Tier I: 1 Procedure OT Treatments $Therapeutic Activity: 8-22 mins  Phineas Semen, MS, OTR/L 08/15/2015, 4:35 PM

## 2015-08-15 NOTE — Progress Notes (Signed)
Utilization review completed.

## 2015-08-15 NOTE — Progress Notes (Signed)
[facility] Progress Note Patient Details:  Elizabeth Thompson [facility] 1951/07/06 [id number] Called brace order in to [employer]. Patient ID: Elizabeth Thompson, female   DOB: 1951/01/06, 64 y.o.   MRN: [id number]   Darrol Poke 08/15/2015, 2:21 PM

## 2015-08-16 LAB — GLUCOSE, CAPILLARY
GLUCOSE-CAPILLARY: 112 mg/dL — AB (ref 65–99)
GLUCOSE-CAPILLARY: 98 mg/dL (ref 65–99)
GLUCOSE-CAPILLARY: 63 mg/dL — AB (ref 65–99)
GLUCOSE-CAPILLARY: 64 mg/dL — AB (ref 65–99)
GLUCOSE-CAPILLARY: 112 mg/dL — AB (ref 65–99)
Glucose-Capillary: 159 mg/dL — ABNORMAL HIGH (ref 65–99)
Glucose-Capillary: 121 mg/dL — ABNORMAL HIGH (ref 65–99)

## 2015-08-16 LAB — CBC
HEMATOCRIT: 33.3 % — AB (ref 36.0–46.0)
Hemoglobin: 10.6 g/dL — ABNORMAL LOW (ref 12.0–15.0)
MCH: 26 pg (ref 26.0–34.0)
MCHC: 31.8 g/dL (ref 30.0–36.0)
MCV: 81.8 fL (ref 78.0–100.0)
PLATELETS: 357 10*3/uL (ref 150–400)
RBC: 4.07 MIL/uL (ref 3.87–5.11)
RDW: 16.8 % — ABNORMAL HIGH (ref 11.5–15.5)
WBC: 13.7 10*3/uL — AB (ref 4.0–10.5)

## 2015-08-16 NOTE — Care Management Note (Signed)
Case Management Note  Patient Details  Name: Elizabeth Thompson MRN: [id number] Date of Birth: 1951-07-08  Subjective/Objective:              S/p L4-5 hemilaminectomy      Action/Plan: PT/OT recommended SNF. Referral made to CSW, [place] working on SNF placement.  Expected Discharge Date:                  Expected Discharge Plan:  Skilled Nursing Facility  In-House Referral:  Clinical Social Work  Discharge planning Services  CM Consult  Post Acute Care Choice:  NA Choice offered to:  NA  DME Arranged:    DME Agency:     HH Arranged:    [place] Agency:     Status of Service:  In process, will continue to follow  Medicare Important Message Given:    Date Medicare IM Given:    Medicare IM give by:    Date Additional Medicare IM Given:    Additional Medicare Important Message give by:     If discussed at [place] of Stay Meetings, dates discussed:    Additional Comments:  Nila Nephew, RN 08/16/2015, 4:05 PM

## 2015-08-16 NOTE — Progress Notes (Signed)
    Subjective: Procedure(s) (LRB): LUMBAR DECOMPRESSION MICRODISCECTOMY L3-S1 (N/A) 2 Days Post-Op  Patient reports pain as 4 on 0-10 scale.  Reports decreased leg pain reports incisional back pain   Positive void - spontaneous.  Denies sensation change in saddle region Positive bowel movement Positive flatus Negative chest pain or shortness of breath  Objective: Vital signs in last 24 hours: Temp:  [98.3 F (36.8 C)-99 F (37.2 C)] 99 F (37.2 C) (09/02 0400) Pulse Rate:  [108-119] 119 (09/02 0400) Resp:  [17-26] 18 (09/02 0400) BP: (107-144)/(69-92) 144/81 mmHg (09/02 0400) SpO2:  [92 %-97 %] 92 % (09/02 0400)  Intake/Output from previous day: 09/01 0701 - 09/02 0700 In: 36 [P.O.:830] Out: 300 [Urine:250; Drains:50]  Labs: No results for input(s): WBC, RBC, HCT, PLT in the last 72 hours. No results for input(s): NA, K, CL, CO2, BUN, CREATININE, GLUCOSE, CALCIUM in the last 72 hours. No results for input(s): LABPT, INR in the last 72 hours.  Physical Exam: Neurologically intact ABD soft Neurovascular intact Intact pulses distally Incision: dressing C/D/I Compartment soft positive back pain.  ambulating   Assessment/Plan: Patient stable  xrays n/a Continue mobilization with physical therapy Continue care  Advance diet Up with therapy Discharge to SNF  Plan on SNF d/c as patient will need more assistance in the short term.   Will make arrangements  Will d/c drain this afternoon  Melina Schools, MD [place] [phone]

## 2015-08-16 NOTE — Progress Notes (Signed)
On initial assessment pt seemed slightly lethargic, but was arousable to voice. [name] breathing almost seemed labored at times, but denied SOB. Of note, she was increased to 3L from 2L overnight. She was alert to self, situation, place, and time after initially stating year was 52. RN was concerned about pts status as did not know her prior to today -NT who worked with pt yesterday reported that she had not acted like this yesterday, no family present. VSS temp 98.6 P 118 BP 107/60  RR 20  94% on 3L [name]. Given pain medicine but no changes after reassessment. RN requested charge RN to assess pt- could not pinpoint any issues specifically but did feel something was off. Rapid response notified- assessed pt and said will put on their list but no immediate concerns. [facility], [place] was also notified-ordered CBC for tomorrow morning. Pt did not eat lunch or dinner, RN fed pt applesauce after dinner because had not eaten since breakfast. Still lethargic throughout the day, arousable to voice and at times touch. For afternoon medications, pt would not lift arm to take- RN put them into pts mouth one at a time. Nursing staff will continue to monitor pt.   [place], Jerry Caras

## 2015-08-16 NOTE — Clinical Social Work Note (Signed)
Clinical Social Work Assessment  Patient Details  Name: Elizabeth Thompson MRN: [id number] Date of Birth: July 04, 1951  Date of referral:  08/16/15               Reason for consult:  Facility Placement, Discharge Planning                Permission sought to share information with:  Facility Art therapist granted to share information::  Yes, Verbal Permission Granted  Name::     [name]  Agency::  [place]  Relationship::  Niece / caregiver  Contact Information:  [phone]  Housing/Transportation Living arrangements for the past 2 months:  Single Family Home Source of Information:  Other (Comment Required) (Caregiver) Patient Interpreter Needed:  None Criminal Activity/Legal Involvement Pertinent to Current Situation/Hospitalization:  No - Comment as needed Significant Relationships:  Other(Comment) (Caregiver / niece) Lives with:  Relatives Do you feel safe going back to the place where you live?  No (High fall risk.) Need for family participation in patient care:  Yes (Comment) (Patient's caregiver / niece active in patient's care.)  Care giving concerns:  Patient's niece expressed no concerns at this time.   Social Worker assessment / plan:  CSW received referral for possible SNF placement at time of discharge. CSW spoke with patient's caregiver / niece regarding patient's discharge disposition. Per patient's niece, family understanding and agreeable to PT recommendation for SNF placement. CSW to continue to follow and assist with discharge planning needs.  Employment status:  Retired, Disabled (Comment on whether or not currently receiving Disability) Insurance information:  Self Pay (Medicaid Pending) PT Recommendations:  [place] / Referral to community resources:  [place]  Patient/Family's Response to care:  Patient's family understanding and agreeable to CSW plan of  care.  Patient/Family's Understanding of and Emotional Response to Diagnosis, Current Treatment, and Prognosis:  Patient's family understanding and agreeable to CSW plan of care.  Emotional Assessment Appearance:  Other (Comment Required (CSW spoke with patient's caregiver.) Attitude/Demeanor/Rapport:  Other (CSW spoke with patient's caregiver.) Affect (typically observed):  Other (CSW spoke with patient's caregiver.) Orientation:  Oriented to Self, Oriented to Place, Oriented to  Time, Oriented to Situation Alcohol / Substance use:  Not Applicable Psych involvement (Current and /or in the community):  No (Comment) (Not appropriate on this admission.)  Discharge Needs  Concerns to be addressed:  No discharge needs identified Readmission within the last 30 days:  No Current discharge risk:  None Barriers to Discharge:  No Barriers Identified   Caroline Sauger, LCSW 08/16/2015, 12:00 PM [phone]

## 2015-08-16 NOTE — Progress Notes (Addendum)
Physical Therapy Treatment Patient Details Name: Elizabeth Thompson MRN: [id number] DOB: 09-20-1951 Today's Date: 08/16/2015    History of Present Illness Pt is a 64 y/o F dx w/ lumbar spinal stenosis w/ disc herniations L3-S1 Rt side.  Pt is now s/p lumbar decompression microdiscectomy L3-S1.  [name] PMH inlcudes HTN, bell's palsy, glaucoma, schizo-affective psychosis, memory disorder, bipolar disorder, frequency of urination, DM.     PT Comments    Patient remains significantly lethargic this session. Patient assist to EOB, tolerated very minimal ambulation with significant cues for arousal. Patient OOB to chair. Continues to require 2 person physical assist for mobility. Recommend use of lift equipment upon return to bed.   Follow Up Recommendations  SNF;Supervision/Assistance - 24 hour     Equipment Recommendations  Other (comment) (TBD at next venue of care)    Recommendations for Other Services       Precautions / Restrictions Precautions Precautions: Fall;Back Precaution Booklet Issued: Yes (comment) Precaution Comments: Reviewed all back precautions Required Braces or Orthoses: Other Brace/Splint Other Brace/Splint: Lumbar brace donned in sitting. Verbal order given per Dr. Rolena Infante to perform eval without brace but maintain back precautions. Restrictions Weight Bearing Restrictions: No    Mobility  Bed Mobility Overal bed mobility: Needs Assistance;+2 for physical assistance Bed Mobility: Rolling;Sidelying to Sit Rolling: Max assist;+2 for physical assistance Sidelying to sit: Total assist;+2 for physical assistance       General bed mobility comments: Patient cued for positional movements, able to perform some self mobility but required assist for initiation and max cues to complete task. Assist of 2 to elevate trunk to upright at EOB  Transfers Overall transfer level: Needs assistance Equipment used: Rolling walker (2 wheeled) Transfers: Sit to/from Stand Sit to  Stand: Max assist;+2 physical assistance Stand pivot transfers: Max assist;+2 physical assistance       General transfer comment: +2 max assist to come to upright, moderate assist of two to power up from bed but then required increased assist to complete upright positioning, increased assist with cues to perform pivot to chair   Ambulation/Gait                 Stairs            Wheelchair Mobility    Modified Rankin (Stroke Patients Only)       Balance     Sitting balance-Leahy Scale: Poor Sitting balance - Comments: min A - min guard as pt is lethargic sitting EOB with eyes closed at times.    Standing balance support: Bilateral upper extremity supported Standing balance-Leahy Scale: Poor Standing balance comment: heavy reliance on RW for support                    Cognition Arousal/Alertness: Lethargic;Suspect due to medications Behavior During Therapy: Flat affect Overall Cognitive Status: Impaired/Different from baseline Area of Impairment: Attention;Following commands;Safety/judgement;Awareness   Current Attention Level: Sustained   Following Commands: Follows one step commands with increased time Safety/Judgement: Decreased awareness of safety;Decreased awareness of deficits Awareness: Emergent   General Comments: Patient remains significantly lethargic this session. Per PT notes from previous session, this was very similar to previous session in which neice stated that this was NOT patient baseline.    Exercises      General Comments General comments (skin integrity, edema, etc.): assisted patient with donning of back brace      Pertinent Vitals/Pain Pain Assessment: Faces Faces Pain Scale: Hurts a little bit Pain Location: back  Pain Descriptors / Indicators: Grimacing Pain Intervention(s): Monitored during session    Home Living                      Prior Function            PT Goals (current goals can now be found  in the care plan section) Acute Rehab PT Goals Patient Stated Goal: to do better PT Goal Formulation: With patient/family Time For Goal Achievement: 08/29/15 Potential to Achieve Goals: Good Progress towards PT goals: Progressing toward goals (very modest progression, significantly lethargic)    Frequency  Min 5X/week    PT Plan Current plan remains appropriate    Co-evaluation             End of Session Equipment Utilized During Treatment: Gait belt;Back brace;Oxygen (3 liters) Activity Tolerance: Patient limited by lethargy Patient left: in chair;with call bell/phone within reach     Time: 1350-1412 PT Time Calculation (min) (ACUTE ONLY): 22 min  Charges:  $Therapeutic Activity: 8-22 mins                    G CodesDuncan Dull Sep 08, 2015, 3:22 PM Alben Deeds, [place] DPT  [phone]

## 2015-08-16 NOTE — Significant Event (Signed)
Rapid Response Event Note  Overview: Time Called: 2863 Arrival Time: 1341 Event Type: Other (Comment)  Initial Focused Assessment:  Called by Rn for a "second set of eyes" due to patient being lethargic and now requiring 3 LPM [place].  Upon my arrival to patients room, Patient asleep, easily aroused answers all questions appropriately on [place] 3lpm.  Skin warm and dry, mae.  Patient denies SOB, or pain.  Patient states she is very tired.     Interventions:  Breath Sounds coarse b/l.  According to [facility], Patient received 15 mg oxycodone IR at 1237.  Therapy at bedside to work with patient   Event Summary:  RN to call if assistance needed   at      at          [facility], Harlin Rain

## 2015-08-17 LAB — GLUCOSE, CAPILLARY
GLUCOSE-CAPILLARY: 46 mg/dL — AB (ref 65–99)
GLUCOSE-CAPILLARY: 50 mg/dL — AB (ref 65–99)
GLUCOSE-CAPILLARY: 65 mg/dL (ref 65–99)
GLUCOSE-CAPILLARY: 79 mg/dL (ref 65–99)
Glucose-Capillary: 126 mg/dL — ABNORMAL HIGH (ref 65–99)
Glucose-Capillary: 158 mg/dL — ABNORMAL HIGH (ref 65–99)
Glucose-Capillary: 65 mg/dL (ref 65–99)
Glucose-Capillary: 72 mg/dL (ref 65–99)
Glucose-Capillary: 82 mg/dL (ref 65–99)
Glucose-Capillary: 86 mg/dL (ref 65–99)
Glucose-Capillary: 87 mg/dL (ref 65–99)

## 2015-08-17 LAB — URINALYSIS, ROUTINE W REFLEX MICROSCOPIC
Bilirubin Urine: NEGATIVE
Glucose, UA: NEGATIVE mg/dL
KETONES UR: NEGATIVE mg/dL
LEUKOCYTES UA: NEGATIVE
NITRITE: NEGATIVE
PH: 6 (ref 5.0–8.0)
PROTEIN: 100 mg/dL — AB
Specific Gravity, Urine: 1.013 (ref 1.005–1.030)
Urobilinogen, UA: 0.2 mg/dL (ref 0.0–1.0)

## 2015-08-17 LAB — URINE MICROSCOPIC-ADD ON

## 2015-08-17 MED ORDER — DEXTROSE 50 % IV SOLN
INTRAVENOUS | Status: AC
  Filled 2015-08-17: qty 50

## 2015-08-17 NOTE — Significant Event (Addendum)
Rapid Response Event Note Called per floor RN for Pt with increased lethargy. Received PO oxycodone at 2000 and remeron 7.59m  and hydroxyzine 50 mg at  2230. RN concerned about her being "too sleepy" called for second set of eyes.   Overview: Call Time : 0005 Arrival Time: 0015 Event Type: Neurologic  Initial Focused Assessment: Pt found resting in bed, lethargic. Arouses easily to voice, oriented to name and location, disoriented to date only. Follows simple commands. Falls asleep easily. Moves all extremities weakly. Pt denies pain and SOB. Pt assisted to use bedpan, able to use weakly use legs and arms during turning appropriately. RR 15-20, Po2 95% on 3 LNC. Lung sounds course in bilateral upper lobes, clear diminished in lower lobes.   Interventions: Pt CBG 65 Pt given full cup of orange juice . Tracey Shufford P.A  on call for Dr. BRolena Infantecalled and updated per floor RN. Per P.A to monitor Pt closely tonight and hold any further narcotics until AM. If Pt still very lethargic by 0500 will give IV nacan. Per P.A ok to raise head of bed to 30 degrees. Pt left resting in bed, asleep. RN to notify myself and Provider for worsening changes.   Event Summary:   at      at          [facility]

## 2015-08-17 NOTE — Clinical Social Work Placement (Signed)
CLINICAL SOCIAL WORK PLACEMENT  NOTE  Date:  08/17/2015  Patient Details  Name: Elizabeth Thompson MRN: [id number] Date of Birth: 1951/12/11  Clinical Social Work is seeking post-discharge placement for this patient at the [place] level of care (*CSW will initial, date and re-position this form in  chart as items are completed):  Yes   Patient/family provided with [place] Work Department's list of facilities offering this level of care within the geographic area requested by the patient (or if unable, by the patient's family).  Yes   Patient/family informed of their freedom to choose among providers that offer the needed level of care, that participate in Medicare, Medicaid or managed care program needed by the patient, have an available bed and are willing to accept the patient.  Yes   Patient/family informed of [place] [facility]'s ownership interest in [facility] and [facility], as well as of the fact that they are under no obligation to receive care at these facilities.  PASRR submitted to EDS on 08/16/15     PASRR number received on       Existing PASRR number confirmed on       FL2 transmitted to all facilities in geographic area requested by pt/family on 08/17/15     FL2 transmitted to all facilities within larger geographic area on 08/17/15     Patient informed that his/her managed care company has contracts with or will negotiate with certain facilities, including the following:            Patient/family informed of bed offers received.  Patient chooses bed at       Physician recommends and patient chooses bed at      Patient to be transferred to   on  .  Patient to be transferred to facility by       Patient family notified on   of transfer.  Name of family member notified:        PHYSICIAN Please sign FL2 (Please sign 30 day note)     Additional Comment:    _______________________________________________ [place]on [place], [place] Weekend CSW [phone]

## 2015-08-17 NOTE — Progress Notes (Signed)
Subjective: 3 Days Post-Op Procedure(s) (LRB): LUMBAR DECOMPRESSION MICRODISCECTOMY L3-S1 (N/A) Patient reports pain as mild to incision. Tolerating Po's.  No saddle parathesia. No bowel or bladder changes.  Denies SOb, cp, or calf pain. Rapid response was call during the night for altered mental status. Pain medication was lowered and she did improve.   Objective: Vital signs in last 24 hours: Temp:  [98.6 F (37 C)-100.8 F (38.2 C)] 99.3 F (37.4 C) (09/03 0511) Pulse Rate:  [106-118] 106 (09/03 0511) Resp:  [20-21] 21 (09/03 0511) BP: (107-141)/(60-74) 108/63 mmHg (09/03 0511) SpO2:  [92 %-98 %] 95 % (09/03 0834)  Intake/Output from previous day: 09/02 0701 - 09/03 0700 In: 580 [P.O.:580] Out: 200 [Urine:200] Intake/Output this shift:     Recent Labs  08/16/15 1915  HGB 10.6*    Recent Labs  08/16/15 1915  WBC 13.7*  RBC 4.07  HCT 33.3*  PLT 357   No results for input(s): NA, K, CL, CO2, BUN, CREATININE, GLUCOSE, CALCIUM in the last 72 hours. No results for input(s): LABPT, INR in the last 72 hours.  Somnolent, but will awake and responed appropriately. Dressing changed. Tenderness around incision. No active drainage or erythema. No signs of infection. Compartments soft. BLE grossly n/v intact. Bilateral plantar and doris flexion intact.   Assessment/Plan: 3 Days Post-Op Procedure(s) (LRB): LUMBAR DECOMPRESSION MICRODISCECTOMY L3-S1 (N/A) D/c to SNF when ready Dressing changed.  Up with PT Continue other care  Altered mental status: Improving with decreased pain medication Monitor neuro status  Naman Spychalski L 08/17/2015, 10:32 AM

## 2015-08-17 NOTE — Progress Notes (Signed)
On assessment , patient noted being Lethargic and arousal sometimes by voice and at times by touch. Patient CBG reading at 2m;64. Patient given orange juice and CBG up to 112.  Rectal temp reading  100.8. Rapid nurse notified and on-call PA notified.

## 2015-08-18 LAB — GLUCOSE, CAPILLARY
GLUCOSE-CAPILLARY: 128 mg/dL — AB (ref 65–99)
GLUCOSE-CAPILLARY: 88 mg/dL (ref 65–99)
GLUCOSE-CAPILLARY: 142 mg/dL — AB (ref 65–99)
GLUCOSE-CAPILLARY: 92 mg/dL (ref 65–99)
GLUCOSE-CAPILLARY: 43 mg/dL — AB (ref 65–99)
GLUCOSE-CAPILLARY: 93 mg/dL (ref 65–99)
Glucose-Capillary: 73 mg/dL (ref 65–99)
Glucose-Capillary: 46 mg/dL — ABNORMAL LOW (ref 65–99)
Glucose-Capillary: 70 mg/dL (ref 65–99)

## 2015-08-18 MED ORDER — ACETAMINOPHEN 325 MG PO TABS: 650.0000 mg | ORAL_TABLET | Freq: Four times a day (QID) | ORAL | Status: DC | PRN

## 2015-08-18 MED ORDER — OXYCODONE HCL 5 MG PO TABS
5.0000 mg | ORAL_TABLET | ORAL | Status: DC | PRN
  Administered 2015-08-18 – 2015-08-20 (×4): 5 mg via ORAL
  Filled 2015-08-18 (×4): qty 1

## 2015-08-18 NOTE — Progress Notes (Signed)
Physical Therapy Treatment Patient Details Name: Elizabeth Thompson MRN: [id number] DOB: 03-30-1951 Today's Date: 08/18/2015    History of Present Illness Pt is a 64 y/o F dx w/ lumbar spinal stenosis w/ disc herniations L3-S1 Rt side.  Pt is now s/p lumbar decompression microdiscectomy L3-S1.  [name] PMH inlcudes HTN, bell's palsy, glaucoma, schizo-affective psychosis, memory disorder, bipolar disorder, frequency of urination, DM.     PT Comments    Pain and then lethargy continue to limit Elizabeth Thompson's progress with mobility; continue to recommend SNF for post-acute rehab  Follow Up Recommendations  SNF;Supervision/Assistance - 24 hour     Equipment Recommendations  Other (comment) (TBD at next venue of care)    Recommendations for Other Services       Precautions / Restrictions Precautions Precautions: Fall;Back Precaution Comments: Reviewed all back precautions Required Braces or Orthoses: Other Brace/Splint Other Brace/Splint: Lumbar brace donned in sitting. Verbal order given per Dr. Rolena Infante to perform eval without brace but maintain back precautions.    Mobility  Bed Mobility Overal bed mobility: Needs Assistance;+2 for physical assistance Bed Mobility: Rolling;Sidelying to Sit Rolling: Max assist;+2 for physical assistance Sidelying to sit: Total assist;+2 for physical assistance       General bed mobility comments: Patient cued for positional movements, able to perform some self mobility but required assist for initiation and max cues to complete task. Assist of 2 to elevate trunk to upright at EOB  Transfers Overall transfer level: Needs assistance Equipment used: Rolling walker (2 wheeled) Transfers: Sit to/from Stand Sit to Stand: +2 physical assistance;Mod assist Stand pivot transfers: +2 physical assistance;Mod assist       General transfer comment: +2 heavy mod assist to come to upright, moderate assist of two to power up from bed but then required increased  assist to complete upright positioning  Ambulation/Gait             General Gait Details: small, shuffling pivotal steps bed to chair with RW   Stairs            Wheelchair Mobility    Modified Rankin (Stroke Patients Only)       Balance     Sitting balance-Leahy Scale: Poor       Standing balance-Leahy Scale: Poor                      Cognition Arousal/Alertness: Lethargic;Suspect due to medications Behavior During Therapy: Flat affect Overall Cognitive Status: Impaired/Different from baseline Area of Impairment: Attention;Following commands;Safety/judgement;Awareness   Current Attention Level: Sustained   Following Commands: Follows one step commands with increased time Safety/Judgement: Decreased awareness of safety;Decreased awareness of deficits          Exercises      General Comments        Pertinent Vitals/Pain Pain Assessment: Faces Pain Score:  ("12/10") Faces Pain Scale: Hurts little more Pain Location: back Pain Descriptors / Indicators: Grimacing Pain Intervention(s): Monitored during session;Repositioned    Home Living                      Prior Function            PT Goals (current goals can now be found in the care plan section) Acute Rehab PT Goals Patient Stated Goal: to do better PT Goal Formulation: With patient/family Time For Goal Achievement: 08/29/15 Potential to Achieve Goals: Good Progress towards PT goals: Progressing toward goals (very modest progression, significantly lethargic)  Frequency  Min 5X/week    PT Plan Current plan remains appropriate    Co-evaluation             End of Session Equipment Utilized During Treatment: Back brace Activity Tolerance: Patient limited by lethargy Patient left: in chair;with call bell/phone within reach     Time: 1343-1359 PT Time Calculation (min) (ACUTE ONLY): 16 min  Charges:  $Therapeutic Activity: 8-22 mins                     G Codes:      Elizabeth Thompson 08/18/2015, 3:17 PM  Elizabeth Thompson, Elizabeth Thompson Pager [phone] Office [phone]

## 2015-08-18 NOTE — Progress Notes (Signed)
Patient still very lethargic this shift. Having a hard time staying awake for minutes at a time. Daughter is concerned that her mom is not her normal self. Patient barely reaching her mouth with food during lunch time. Patient responds to voice and able to follow commands. Vital signs are in good range. Called to rapid response nurse, she recommended I notify the physician. Called to the on-call, Larkin Ina the PA states to monitor the patient closely for any abrupt changes. Patient currently sitting up in her recliner after dinner. She was able to feed herself 50% of her meal. Will continue to monitor.

## 2015-08-18 NOTE — Significant Event (Addendum)
Rapid Response Event Note  Overview: Time Called: 1540 Arrival Time: 1425 Event Type: Other (Comment)  Initial Focused Assessment:  Called by Rn for patient with lethargy. Upon my arrival to patients room RN at bedside, Patient sitting on bedside commode.  As per RN patient has been lethargic, MD aware and rapid response has been called for prior concerns of the same.  VS 102/46, HR 105, 96% on 3 lpm and RR 17.  Patient is easily arouseable answered questions appropriately except could not tell me the year but did tell me the correct president.   Interventions:  Patient skin warm and dry, patient at first denied pain, then during the conversation c/o slight back pain.  Patient states she feels very weak and tired.  Patient last received oxycodone IR 15 mg at 0400 on 08/18/15.     Event Summary:  Recommend RN to call MD and update on findings and concerns.  No RRT interventions per protocol indicated.  RN to call if assistance needed   at      at          [facility], Harlin Rain

## 2015-08-18 NOTE — Progress Notes (Signed)
Subjective: 4 Days Post-Op Procedure(s) (LRB): LUMBAR DECOMPRESSION MICRODISCECTOMY L3-S1 (N/A) Patient reports pain as moderate.  Denies numbness, tingling or weakness in LEs.  Tolerating regular diet.  Objective: Vital signs in last 24 hours: Temp:  [98.8 F (37.1 C)-99.1 F (37.3 C)] 98.8 F (37.1 C) (09/04 0517) Pulse Rate:  [105-108] 105 (09/04 0517) Resp:  [17-20] 17 (09/04 0517) BP: (103-149)/(61-75) 103/61 mmHg (09/04 0517) SpO2:  [95 %-99 %] 99 % (09/04 0837)  Intake/Output from previous day: 09/03 0701 - 09/04 0700 In: 240 [P.O.:240] Out: -  Intake/Output this shift:     Recent Labs  08/16/15 1915  HGB 10.6*    Recent Labs  08/16/15 1915  WBC 13.7*  RBC 4.07  HCT 33.3*  PLT 357   No results for input(s): NA, K, CL, CO2, BUN, CREATININE, GLUCOSE, CALCIUM in the last 72 hours. No results for input(s): LABPT, INR in the last 72 hours.  PE:  B LEs with 5/5 strength in PF and DF at ankles.  Sens to LT intact throughout foot.  Assessment/Plan: 4 Days Post-Op Procedure(s) (LRB): LUMBAR DECOMPRESSION MICRODISCECTOMY L3-S1 (N/A) Discharge to SNF when able.  Continue PT.  Wylene Simmer 08/18/2015, 8:50 AM

## 2015-08-19 LAB — GLUCOSE, CAPILLARY
GLUCOSE-CAPILLARY: 100 mg/dL — AB (ref 65–99)
GLUCOSE-CAPILLARY: 106 mg/dL — AB (ref 65–99)
GLUCOSE-CAPILLARY: 131 mg/dL — AB (ref 65–99)
GLUCOSE-CAPILLARY: 134 mg/dL — AB (ref 65–99)
Glucose-Capillary: 169 mg/dL — ABNORMAL HIGH (ref 65–99)
Glucose-Capillary: 93 mg/dL (ref 65–99)
Glucose-Capillary: 97 mg/dL (ref 65–99)

## 2015-08-19 MED ORDER — IPRATROPIUM-ALBUTEROL 0.5-2.5 (3) MG/3ML IN SOLN: 3.0000 mL | Freq: Four times a day (QID) | RESPIRATORY_TRACT | Status: DC | PRN

## 2015-08-19 NOTE — Progress Notes (Signed)
Physical Therapy Treatment Patient Details Name: Elizabeth Thompson MRN: [id number] DOB: [birth year] Today's Date: 08/19/2015    History of Present Illness Pt is a 64 y/o F dx w/ lumbar spinal stenosis w/ disc herniations L3-S1 Rt side.  Pt is now s/p lumbar decompression microdiscectomy L3-S1.  [name] PMH inlcudes HTN, bell's palsy, glaucoma, schizo-affective psychosis, memory disorder, bipolar disorder, frequency of urination, DM.     PT Comments    Elizabeth Thompson remains very lethargic and requires max verbal cues to open eyes.  She is oriented x 4 but is "very sleepy".  She requires max +2 assist for bed mobility, and mod +2 assist for sit<>stand and stand pivot transfers.  Pt will benefit from continued skilled PT services to increase functional independence and safety.   Follow Up Recommendations  SNF;Supervision/Assistance - 24 hour     Equipment Recommendations  Other (comment) (TBD at next venue of care)    Recommendations for Other Services       Precautions / Restrictions Precautions Precautions: Fall;Back Precaution Comments: Reviewed all back precautions Required Braces or Orthoses: Other Brace/Splint Other Brace/Splint: Lumbar brace donned in sitting w/ total assist. Restrictions Weight Bearing Restrictions: No    Mobility  Bed Mobility Overal bed mobility: Needs Assistance;+2 for physical assistance Bed Mobility: Rolling;Sidelying to Sit Rolling: Max assist;+2 for physical assistance Sidelying to sit: +2 for physical assistance;Max assist;HOB elevated       General bed mobility comments: HOB slightly elevated and max +2 assist to achieve sitting position w/ support from trunk.  Pt does participate in bed mobility this session.  Needs max assist w/ use of bed pad to scoot to EOB and needs verbal cues to keep her hands on the bed so she does not slide forward.  Transfers Overall transfer level: Needs assistance Equipment used: Rolling walker (2 wheeled) Transfers: Sit  to/from [facility] Sit to Stand: +2 physical assistance;Mod assist Stand pivot transfers: +2 physical assistance;Mod assist       General transfer comment: +2 heavy mod assist to come to upright and verbal and tactile cues to stand upright; however pt maintains mod trunk flexion.  Mod +2 assist for stand pivot transfer to manage RW and to stabilize pt and RW.  Max verbal cues to stand upright and to push through Bil LEs to maintain upright in front of chair, pt stood ~1 minute before sitting.  Ambulation/Gait             General Gait Details: small, shuffling pivotal steps bed to chair with RW   Stairs            Wheelchair Mobility    Modified Rankin (Stroke Patients Only)       Balance Overall balance assessment: Needs assistance Sitting-balance support: Bilateral upper extremity supported;Feet supported Sitting balance-Leahy Scale: Poor Sitting balance - Comments: Min assist to keep pt from scooting too close to EOB and for positioining of Bil UEs on bed to maintain upright.   Standing balance support: Bilateral upper extremity supported;During functional activity Standing balance-Leahy Scale: Poor                      Cognition Arousal/Alertness: Lethargic;Suspect due to medications Behavior During Therapy: Flat affect Overall Cognitive Status: Impaired/Different from baseline Area of Impairment: Attention;Following commands;Safety/judgement;Awareness   Current Attention Level: Sustained   Following Commands: Follows one step commands with increased time Safety/Judgement: Decreased awareness of safety;Decreased awareness of deficits Awareness: Emergent   General Comments: Pt  continues to be significantly lethargic this session and requires verbal cues for eye opening.  Pt says she is sleepy.   She is oriented x 4.    Exercises General Exercises - Lower Extremity Ankle Circles/Pumps: AROM;Both;10 reps;Seated Gluteal Sets:  Strengthening;Both;Seated;5 reps    General Comments        Pertinent Vitals/Pain Pain Assessment: Faces Faces Pain Scale: Hurts little more Pain Location: back Pain Descriptors / Indicators: Grimacing;Moaning Pain Intervention(s): Monitored during session;Limited activity within patient's tolerance    Home Living                      Prior Function            PT Goals (current goals can now be found in the care plan section) Acute Rehab PT Goals Patient Stated Goal: to do better PT Goal Formulation: With patient/family Time For Goal Achievement: 08/29/15 Potential to Achieve Goals: Good Progress towards PT goals: Progressing toward goals (very modestly)    Frequency  Min 5X/week    PT Plan Current plan remains appropriate    Co-evaluation             End of Session Equipment Utilized During Treatment: Back brace Activity Tolerance: Patient limited by lethargy Patient left: in chair;with call bell/phone within reach     Time: 0941-1005 PT Time Calculation (min) (ACUTE ONLY): 24 min  Charges:  $Therapeutic Activity: 23-37 mins                    G Codes:      Joslyn Hy PT, DPT [phone] Pager: [phone] 08/19/2015, 11:53 AM

## 2015-08-19 NOTE — Clinical Social Work Note (Signed)
Clinical Social Worker noted that 30 day note has not been signed. [place] MUST 30 day note and FL-2 remains on chart for MD signature. 30 day note and FL-2 must be signed by MD and faxed to [facility] MUST for review before PASARR can be issued.   BARRIER to D/C: Patient can NOT discharge to SNF without [place] PASARR.  CSW will continue to follow patient and pt's family for continued support and to facilitate pt's discharge needs once medically stable.   Glendon Axe, MSW, LCSWA [phone] 08/19/2015 1:41 PM

## 2015-08-19 NOTE — Progress Notes (Addendum)
Subjective: 5 Days Post-Op Procedure(s) (LRB): LUMBAR DECOMPRESSION MICRODISCECTOMY L3-S1 (N/A)   Seen in rounds with Dr. Gladstone Lighter.  Patient reports pain as moderate, pain controlled. Resting comfortably in bed. No events throughout the night.   Objective:   VITALS:   Filed Vitals:   08/19/15 0608  BP: 108/47  Pulse: 100  Temp: 98.6 F (37 C)  Resp: 16    Dorsiflexion/Plantar flexion intact Incision: dressing C/D/I No cellulitis present Compartment soft  LABS  Recent Labs  08/16/15 1915  HGB 10.6*  HCT 33.3*  WBC 13.7*  PLT 357     Assessment/Plan: 5 Days Post-Op Procedure(s) (LRB): LUMBAR DECOMPRESSION MICRODISCECTOMY L3-S1 (N/A) Up with therapy Discharge to SNF eventually, when ready     [place]. Armetta Henri   PAC  08/19/2015, 8:36 AM

## 2015-08-20 ENCOUNTER — Encounter (HOSPITAL_COMMUNITY): Payer: Self-pay | Admitting: General Practice

## 2015-08-20 LAB — GLUCOSE, CAPILLARY
Glucose-Capillary: 126 mg/dL — ABNORMAL HIGH (ref 65–99)
Glucose-Capillary: 235 mg/dL — ABNORMAL HIGH (ref 65–99)
Glucose-Capillary: 62 mg/dL — ABNORMAL LOW (ref 65–99)

## 2015-08-20 LAB — CBC
HCT: 34.8 % — ABNORMAL LOW (ref 36.0–46.0)
HEMOGLOBIN: 11.1 g/dL — AB (ref 12.0–15.0)
MCH: 26.3 pg (ref 26.0–34.0)
MCHC: 31.9 g/dL (ref 30.0–36.0)
MCV: 82.5 fL (ref 78.0–100.0)
PLATELETS: 428 10*3/uL — AB (ref 150–400)
RBC: 4.22 MIL/uL (ref 3.87–5.11)
RDW: 16 % — ABNORMAL HIGH (ref 11.5–15.5)
WBC: 6.4 10*3/uL (ref 4.0–10.5)

## 2015-08-20 MED ORDER — OXYCODONE HCL 5 MG PO TABS
10.0000 mg | ORAL_TABLET | ORAL | Status: DC | PRN
  Administered 2015-08-20: 10 mg via ORAL
  Filled 2015-08-20: qty 2

## 2015-08-20 NOTE — Progress Notes (Addendum)
Patient ID: Elizabeth Thompson, female   DOB: [birth year], 64 y.o.   MRN: [id number]    Subjective: 6 Days Post-Op Procedure(s) (LRB): LUMBAR DECOMPRESSION MICRODISCECTOMY L3-S1 (N/A) Patient reports pain as 2 on 0-10 scale.   Denies CP or SOB.  Voiding without difficulty. Positive flatus. Pt had BM on 9/4.  Feels like she needs to today. Alert, awake zanswering questions appropriately  Objective: Vital signs in last 24 hours: Temp:  [98.5 F (36.9 C)-99.2 F (37.3 C)] 98.5 F (36.9 C) (09/06 0600) Pulse Rate:  [81-102] 81 (09/06 0600) Resp:  [16] 16 (09/06 0600) BP: (105-126)/(43-58) 105/43 mmHg (09/06 0600) SpO2:  [95 %-97 %] 95 % (09/06 0600)  Intake/Output from previous day: 09/05 0701 - 09/06 0700 In: 480 [P.O.:480] Out: -  Intake/Output this shift: Total I/O In: 360 [P.O.:360] Out: -   Labs:  Recent Labs  08/20/15 0913  HGB 11.1*    Recent Labs  08/20/15 0913  WBC 6.4  RBC 4.22  HCT 34.8*  PLT 428*   No results for input(s): NA, K, CL, CO2, BUN, CREATININE, GLUCOSE, CALCIUM in the last 72 hours. No results for input(s): LABPT, INR in the last 72 hours.  Physical Exam: Neurologically intact ABD soft Sensation intact distally Intact pulses distally Dorsiflexion/Plantar flexion intact Incision: dressing C/D/I Compartment soft  Assessment/Plan: 6 Days Post-Op Procedure(s) (LRB): LUMBAR DECOMPRESSION MICRODISCECTOMY L3-S1 (N/A) Up with therapy Discharge to SNF - brought in signed paperwork requested from Social Worker    [place], Darla Lesches for Dr. Melina Schools [facility] [phone] 08/20/2015, 12:30 PM  WBC decreased HCT stable Patient is A+O X3 Pain well controlled Positive BM/void Patient doing well Plan on SNF d/c later today F/u 1 week

## 2015-08-20 NOTE — Progress Notes (Signed)
Pt ready for d/c per MD. Report called to Lyndee Leo, RN supervisor at [facility], all questions answered. Belongings gathered and removed by pt's niece. Pt to be transported via [facility], will continue to monitor until that time.  Elizabeth Thompson G  08/20/2015 3:00 PM

## 2015-08-20 NOTE — Progress Notes (Signed)
Order for A1c stat and information for FL2 to fax to Dr. Rolena Infante this am fax # [phone] has been passed on to day shift for follow up.

## 2015-08-20 NOTE — Discharge Planning (Signed)
Patient to be discharged to [facility]. Patient and patient's niece updated regarding discharge.  Facility: [facility] RN report number: [phone] Transportation: EMS  Lubertha Sayres, [place] [phone]) and Surgical [phone])

## 2015-08-20 NOTE — Clinical Social Work Note (Addendum)
30 day note and FL2 signed by MD. CSW has provided [place] PASARR with appropriate documentation.  CSW awaiting PASARR number prior to patient discharge.  Patient has bed available at [facility] once medically stable and PASARR received.  UPDATE 11:55am. PASARR received. [phone] Claudean Severance, [place] [phone]) and Surgical [phone])

## 2015-08-20 NOTE — Clinical Social Work Placement (Signed)
   CLINICAL SOCIAL WORK PLACEMENT  NOTE  Date:  08/20/2015  Patient Details  Name: Elizabeth Thompson MRN: [id number] Date of Birth: [birth year]  Clinical Social Work is seeking post-discharge placement for this patient at the [place] level of care (*CSW will initial, date and re-position this form in  chart as items are completed):  Yes   Patient/family provided with [place] Work Department's list of facilities offering this level of care within the geographic area requested by the patient (or if unable, by the patient's family).  Yes   Patient/family informed of their freedom to choose among providers that offer the needed level of care, that participate in Medicare, Medicaid or managed care program needed by the patient, have an available bed and are willing to accept the patient.  Yes   Patient/family informed of [facility]'s ownership interest in [facility] and [facility], as well as of the fact that they are under no obligation to receive care at these facilities.  PASRR submitted to [facility] on 08/16/15     PASRR number received on       Existing PASRR number confirmed on       FL2 transmitted to all facilities in geographic area requested by pt/family on 08/17/15     FL2 transmitted to all facilities within larger geographic area on 08/17/15     Patient informed that his/her managed care company has contracts with or will negotiate with certain facilities, including the following:        Yes   Patient/family informed of bed offers received.  Patient chooses bed at [facility]     Physician recommends and patient chooses bed at  (n/a)    Patient to be transferred to [facility] on 08/20/15.  Patient to be transferred to facility by [facility]     Patient family notified on 08/20/15 of transfer.  Name of family member notified:  Patient's niece updated.     PHYSICIAN  (Please sign 30 day note)     Additional Comment:     _______________________________________________ Caroline Sauger, LCSW 08/20/2015, 1:17 PM

## 2015-08-20 NOTE — Discharge Summary (Addendum)
Physician Discharge Summary  Patient ID: Elizabeth Thompson MRN: [id number] DOB/AGE: [birth year] 65 y.o.  Admit date: 08/14/2015 Discharge date: 08/20/2015  Admission Diagnoses:  Low Back Pain - post op lumbar surgery  Discharge Diagnoses:  Active Problems:   Back pain   Past Medical History  Diagnosis Date  . Hypertension   . Bell's palsy   . Glaucoma   . Schizo-affective psychosis   . Memory disorder 09/05/2014  . Uterine fibroid   . Hypercalcemia   . Hyperlipidemia   . Back pain   . Chronic low back pain 05/09/2015  . Bronchitis   . Bipolar disorder   . Frequency of urination   . Cyst of right kidney   . GERD (gastroesophageal reflux disease)   . Arthritis   . Diabetes mellitus without complication     type 2    Surgeries: Procedure(s): LUMBAR DECOMPRESSION MICRODISCECTOMY L3-S1 on 08/14/2015   Consultants (if any):    Discharged Condition: Improved  Hospital Course: Elizabeth Thompson is an 64 y.o. female who was admitted 08/14/2015 with a diagnosis of Low back pain and b/l leg weaknesss and went to the operating room on 08/14/2015 and underwent the above named procedures.    She was given perioperative antibiotics:  Anti-infectives    Start     Dose/Rate Route Frequency Ordered Stop   08/15/15 0100  ceFAZolin (ANCEF) IVPB 1 g/50 mL premix     1 g 100 mL/hr over 30 Minutes Intravenous Every 8 hours 08/14/15 1912 08/15/15 1028   08/14/15 1406  vancomycin (VANCOCIN) powder  Status:  Discontinued       As needed 08/14/15 1408 08/14/15 1731   08/14/15 1230  ceFAZolin (ANCEF) IVPB 2 g/50 mL premix     2 g 100 mL/hr over 30 Minutes Intravenous To [facility] 08/13/15 1231 08/14/15 1707    .  She was given sequential compression devices, early ambulation, and TED hose for DVT prophylaxis.  She benefited maximally from the hospital stay and there were no complications.    Recent vital signs:  Filed Vitals:   08/20/15 0600  BP: 105/43  Pulse: 81  Temp:  98.5 F (36.9 C)  Resp: 16    Recent laboratory studies:  Lab Results  Component Value Date   HGB 11.1* 08/20/2015   HGB 10.6* 08/16/2015   HGB 12.7 08/09/2015   Lab Results  Component Value Date   WBC 6.4 08/20/2015   PLT 428* 08/20/2015   No results found for: INR Lab Results  Component Value Date   NA 139 08/09/2015   K 4.3 08/09/2015   CL 106 08/09/2015   CO2 23 08/09/2015   BUN 9 08/09/2015   CREATININE 0.96 08/09/2015   GLUCOSE 108* 08/09/2015    Discharge Medications:     Medication List    STOP taking these medications        acetaminophen 325 MG tablet  Commonly known as:  TYLENOL     ibuprofen 200 MG tablet  Commonly known as:  ADVIL,MOTRIN     multivitamin with minerals Tabs tablet     oxyCODONE-acetaminophen 5-325 MG per tablet  Commonly known as:  PERCOCET/ROXICET  Replaced by:  oxyCODONE-acetaminophen 10-325 MG per tablet      TAKE these medications        albuterol 108 (90 BASE) MCG/ACT inhaler  Commonly known as:  PROVENTIL HFA;VENTOLIN HFA  Inhale 2 puffs into the lungs every 6 (six) hours as needed for wheezing or shortness of  breath.     ARIPiprazole 20 MG tablet  Commonly known as:  ABILIFY  Take 1 tablet (20 mg total) by mouth at bedtime.     donepezil 5 MG tablet  Commonly known as:  ARICEPT  Take 1 tablet (5 mg total) by mouth at bedtime.     glimepiride 4 MG tablet  Commonly known as:  AMARYL  Take 1 tablet (4 mg total) by mouth daily with breakfast.     hydrOXYzine 50 MG tablet  Commonly known as:  ATARAX/VISTARIL  Take 50 mg by mouth at bedtime.     Ipratropium-Albuterol 20-100 MCG/ACT Aers respimat  Commonly known as:  COMBIVENT  Inhale 1 puff into the lungs every 6 (six) hours.     lamoTRIgine 100 MG tablet  Commonly known as:  LAMICTAL  Take 1 tablet (100 mg total) by mouth every evening.     latanoprost 0.005 % ophthalmic solution  Commonly known as:  XALATAN  Place 1 drop into both eyes at bedtime.      lisinopril 40 MG tablet  Commonly known as:  PRINIVIL,ZESTRIL  Take 1 tablet (40 mg total) by mouth daily.     metFORMIN 1000 MG tablet  Commonly known as:  GLUCOPHAGE  Take 1 tablet (1,000 mg total) by mouth 2 (two) times daily with a meal.     methocarbamol 500 MG tablet  Commonly known as:  ROBAXIN  Take 1 tablet (500 mg total) by mouth 3 (three) times daily as needed for muscle spasms.     mirtazapine 7.5 MG tablet  Commonly known as:  REMERON  Take 1 tablet (7.5 mg total) by mouth at bedtime.     ondansetron 4 MG tablet  Commonly known as:  ZOFRAN  Take 1 tablet (4 mg total) by mouth every 8 (eight) hours as needed for nausea or vomiting.     oxyCODONE-acetaminophen 10-325 MG per tablet  Commonly known as:  PERCOCET  Take 1 tablet by mouth every 4 (four) hours as needed for pain.     Selenium Sulf-Pyrithione-Urea 2.25 % Sham  Apply 1 application topically once a week. Applied to scalp for dermatitis     simvastatin 40 MG tablet  Commonly known as:  ZOCOR  Take 1 tablet (40 mg total) by mouth daily.     VITAMIN D3 PO  Take 1 tablet by mouth daily.        Diagnostic Studies: Dg Lumbar Spine 2-3 Views  08/14/2015   CLINICAL DATA:  Lumbar decompression/ microdiscectomy L3-S1.  EXAM: LUMBAR SPINE - 2-3 VIEW  COMPARISON:  MRI 12/25/2006  FINDINGS: Two portable intraoperative images demonstrate normal vertebral body alignment and heights. There is mild spondylosis throughout the lumbar spine. There is disc space narrowing from the L3-4 level to the L5-S1 level. First image demonstrates a surgical instrument with tip over the posterior elements at the L2 level and surgical instrument with tip over the posterior soft tissues at the L4-5 level. Subsequent image demonstrates surgical instrument with tip along the posterior border of the L3 vertebral body and along the posterior aspect of S1. Recommend correlation with findings at the time of the procedure.  IMPRESSION: Mild  spondylosis of the cervical spine with disc disease from the L3-4 level to the L5-S1 level.   Electronically Signed   By: Marin Olp M.D.   On: 08/14/2015 16:39   Dg Spine Portable 1 View  08/14/2015   CLINICAL DATA:  64 year old female undergoing lumbar surgery. Initial encounter.  EXAM: PORTABLE SPINE - 1 VIEW  COMPARISON:  Intraoperative Film at 1336 hr today. [facility] lumbar MRI 12/25/2006.  FINDINGS: Intraoperative portable cross-table lateral view of the lumbar spine labeled #2 at 1355 hrs.  Normal lumbar segmentation. Vacuum disc at L4-L5. Surgical probe directed at the L4-L5 disc space level. Calcified aortic atherosclerosis.  IMPRESSION: Intraoperative localization at L4-L5.   Electronically Signed   By: Genevie Ann M.D.   On: 08/14/2015 14:02    Disposition: 01-Home or Self Care        Follow-up Information    Follow up with Dahlia Bailiff, MD. Schedule an appointment as soon as possible for a visit in 2 weeks.   Specialty:  Orthopedic Surgery   Why:  If symptoms worsen, For suture removal, For wound re-check   Contact information:   [address] [place] [address] [phone]        Signed: Valinda Hoar 08/20/2015, 12:39 PM   Physician Discharge Summary  Patient ID: Elizabeth Thompson MRN: [id number] DOB/AGE: [birth year] 64 y.o.  Admit date: 08/14/2015 Discharge date: 08/20/2015  Admission Diagnoses:  <principal problem not specified>  Discharge Diagnoses:  Active Problems:   Back pain   Past Medical History  Diagnosis Date  . Hypertension   . Bell's palsy   . Glaucoma   . Schizo-affective psychosis   . Memory disorder 09/05/2014  . Uterine fibroid   . Hypercalcemia   . Hyperlipidemia   . Back pain   . Chronic low back pain 05/09/2015  . Bronchitis   . Bipolar disorder   . Frequency of urination   . Cyst of right kidney   . GERD (gastroesophageal reflux disease)   . Arthritis   . Diabetes mellitus without  complication     type 2    Surgeries: Procedure(s): LUMBAR DECOMPRESSION MICRODISCECTOMY L3-S1 on 08/14/2015   Consultants (if any):    Discharged Condition: Improved  Hospital Course: ALAISA MOFFITT is an 64 y.o. female who was admitted 08/14/2015 with a diagnosis of <principal problem not specified> and went to the operating room on 08/14/2015 and underwent the above named procedures.  On 9/4 the pt seemed lethargic.  The pt continued to awaken when she was talked to and followed commands.  Her narcotics were decreased. CBC and UA were ordered.  No infection was found.  The pt has been getting up with PT and eating well.   She was given perioperative antibiotics:  Anti-infectives    Start     Dose/Rate Route Frequency Ordered Stop   08/15/15 0100  ceFAZolin (ANCEF) IVPB 1 g/50 mL premix     1 g 100 mL/hr over 30 Minutes Intravenous Every 8 hours 08/14/15 1912 08/15/15 1028   08/14/15 1406  vancomycin (VANCOCIN) powder  Status:  Discontinued       As needed 08/14/15 1408 08/14/15 1731   08/14/15 1230  ceFAZolin (ANCEF) IVPB 2 g/50 mL premix     2 g 100 mL/hr over 30 Minutes Intravenous To ShortStay Surgical 08/13/15 1231 08/14/15 1707    .  She was given sequential compression devices, early ambulation, and TED hose for DVT prophylaxis.  She benefited maximally from the hospital stay and there were no complications.    Recent vital signs:  Filed Vitals:   08/20/15 0600  BP: 105/43  Pulse: 81  Temp: 98.5 F (36.9 C)  Resp: 16    Recent laboratory studies:  Lab Results  Component Value Date  HGB 11.1* 08/20/2015   HGB 10.6* 08/16/2015   HGB 12.7 08/09/2015   Lab Results  Component Value Date   WBC 6.4 08/20/2015   PLT 428* 08/20/2015   No results found for: INR Lab Results  Component Value Date   NA 139 08/09/2015   K 4.3 08/09/2015   CL 106 08/09/2015   CO2 23 08/09/2015   BUN 9 08/09/2015   CREATININE 0.96 08/09/2015   GLUCOSE 108* 08/09/2015     Discharge Medications:     Medication List    STOP taking these medications        acetaminophen 325 MG tablet  Commonly known as:  TYLENOL     ibuprofen 200 MG tablet  Commonly known as:  ADVIL,MOTRIN     multivitamin with minerals Tabs tablet     oxyCODONE-acetaminophen 5-325 MG per tablet  Commonly known as:  PERCOCET/ROXICET  Replaced by:  oxyCODONE-acetaminophen 10-325 MG per tablet      TAKE these medications        albuterol 108 (90 BASE) MCG/ACT inhaler  Commonly known as:  PROVENTIL HFA;VENTOLIN HFA  Inhale 2 puffs into the lungs every 6 (six) hours as needed for wheezing or shortness of breath.     ARIPiprazole 20 MG tablet  Commonly known as:  ABILIFY  Take 1 tablet (20 mg total) by mouth at bedtime.     donepezil 5 MG tablet  Commonly known as:  ARICEPT  Take 1 tablet (5 mg total) by mouth at bedtime.     glimepiride 4 MG tablet  Commonly known as:  AMARYL  Take 1 tablet (4 mg total) by mouth daily with breakfast.     hydrOXYzine 50 MG tablet  Commonly known as:  ATARAX/VISTARIL  Take 50 mg by mouth at bedtime.     Ipratropium-Albuterol 20-100 MCG/ACT Aers respimat  Commonly known as:  COMBIVENT  Inhale 1 puff into the lungs every 6 (six) hours.     lamoTRIgine 100 MG tablet  Commonly known as:  LAMICTAL  Take 1 tablet (100 mg total) by mouth every evening.     latanoprost 0.005 % ophthalmic solution  Commonly known as:  XALATAN  Place 1 drop into both eyes at bedtime.     lisinopril 40 MG tablet  Commonly known as:  PRINIVIL,ZESTRIL  Take 1 tablet (40 mg total) by mouth daily.     metFORMIN 1000 MG tablet  Commonly known as:  GLUCOPHAGE  Take 1 tablet (1,000 mg total) by mouth 2 (two) times daily with a meal.     methocarbamol 500 MG tablet  Commonly known as:  ROBAXIN  Take 1 tablet (500 mg total) by mouth 3 (three) times daily as needed for muscle spasms.     mirtazapine 7.5 MG tablet  Commonly known as:  REMERON  Take 1 tablet  (7.5 mg total) by mouth at bedtime.     ondansetron 4 MG tablet  Commonly known as:  ZOFRAN  Take 1 tablet (4 mg total) by mouth every 8 (eight) hours as needed for nausea or vomiting.     oxyCODONE-acetaminophen 10-325 MG per tablet  Commonly known as:  PERCOCET  Take 1 tablet by mouth every 4 (four) hours as needed for pain.     Selenium Sulf-Pyrithione-Urea 2.25 % Sham  Apply 1 application topically once a week. Applied to scalp for dermatitis     simvastatin 40 MG tablet  Commonly known as:  ZOCOR  Take 1 tablet (40 mg total)  by mouth daily.     VITAMIN D3 PO  Take 1 tablet by mouth daily.        Diagnostic Studies: Dg Lumbar Spine 2-3 Views  08/14/2015   CLINICAL DATA:  Lumbar decompression/ microdiscectomy L3-S1.  EXAM: LUMBAR SPINE - 2-3 VIEW  COMPARISON:  MRI 12/25/2006  FINDINGS: Two portable intraoperative images demonstrate normal vertebral body alignment and heights. There is mild spondylosis throughout the lumbar spine. There is disc space narrowing from the L3-4 level to the L5-S1 level. First image demonstrates a surgical instrument with tip over the posterior elements at the L2 level and surgical instrument with tip over the posterior soft tissues at the L4-5 level. Subsequent image demonstrates surgical instrument with tip along the posterior border of the L3 vertebral body and along the posterior aspect of S1. Recommend correlation with findings at the time of the procedure.  IMPRESSION: Mild spondylosis of the cervical spine with disc disease from the L3-4 level to the L5-S1 level.   Electronically Signed   By: Marin Olp M.D.   On: 08/14/2015 16:39   Dg Spine Portable 1 View  08/14/2015   CLINICAL DATA:  64 year old female undergoing lumbar surgery. Initial encounter.  EXAM: PORTABLE SPINE - 1 VIEW  COMPARISON:  Intraoperative Film at 1336 hr today. [facility] lumbar MRI 12/25/2006.  FINDINGS: Intraoperative portable cross-table lateral view of the  lumbar spine labeled #2 at 1355 hrs.  Normal lumbar segmentation. Vacuum disc at L4-L5. Surgical probe directed at the L4-L5 disc space level. Calcified aortic atherosclerosis.  IMPRESSION: Intraoperative localization at L4-L5.   Electronically Signed   By: Genevie Ann M.D.   On: 08/14/2015 14:02    Disposition: The pt will DC to a [facility].  The pt has become quite deconditioned prior to her lumbar surgery.        Follow-up Information    Follow up with Dahlia Bailiff, MD. Schedule an appointment as soon as possible for a visit in 2 weeks.   Specialty:  Orthopedic Surgery   Why:  If symptoms worsen, For suture removal, For wound re-check   Contact information:   [address] [place] [address] [phone]        Signed: Valinda Hoar 08/20/2015, 12:39 PM

## 2015-08-20 NOTE — Progress Notes (Signed)
Occupational Therapy Treatment Patient Details Name: Elizabeth Thompson MRN: [id number] DOB: September 18, 1951 Today's Date: 08/20/2015    History of present illness Pt is a 64 y/o F dx w/ lumbar spinal stenosis w/ disc herniations L3-S1 Rt side.  Pt is now s/p lumbar decompression microdiscectomy L3-S1.  [name] PMH inlcudes HTN, bell's palsy, glaucoma, schizo-affective psychosis, memory disorder, bipolar disorder, frequency of urination, DM.    OT comments  Pt more alert today.  Focus of session on instruction in back precautions, bed mobility, donning back brace, toilet transfer and seated grooming.  Pt continues to be appropriate for post acute rehab in SNF.  Follow Up Recommendations  SNF    Equipment Recommendations       Recommendations for Other Services      Precautions / Restrictions Precautions Precautions: Fall;Back Precaution Comments: Pt able to state 1/3 back precautions, reeducated. Required Braces or Orthoses: Spinal Brace Spinal Brace: Applied in sitting position Restrictions Weight Bearing Restrictions: No       Mobility Bed Mobility Overal bed mobility: Needs Assistance;+2 for physical assistance Bed Mobility: Rolling;Sidelying to Sit Rolling: +2 for physical assistance;Mod assist Sidelying to sit: +2 for physical assistance;Mod assist;HOB elevated       General bed mobility comments: multimodal cues for log roll technique, assist for trunk, LEs and with pad to scoot hips to EOB.  Transfers Overall transfer level: Needs assistance Equipment used: Rolling walker (2 wheeled) Transfers: Stand Pivot Transfers Sit to Stand: +2 physical assistance;Min assist         General transfer comment: verbal and tactile cues for hand placement, assist to rise, gain balance and advance walker.    Balance     Sitting balance-Leahy Scale: Poor Sitting balance - Comments: posterior lean when using UEs to donn back brace     Standing balance-Leahy Scale: Poor Standing  balance comment: heavy reliance on UEs                   ADL Overall ADL's : Needs assistance/impaired     Grooming: Wash/dry hands;Wash/dry face;Set up;Sitting           Upper Body Dressing : Maximal assistance;Sitting Upper Body Dressing Details (indicate cue type and reason): back brace     Toilet Transfer: +2 for physical assistance;Minimal assistance;Stand-pivot;BSC;RW   Toileting- Clothing Manipulation and Hygiene: Total assistance;Sit to/from stand Toileting - Clothing Manipulation Details (indicate cue type and reason): heavy reliance on UEs in standing     Functional mobility during ADLs: +2 for physical assistance;Minimal assistance;Rolling walker General ADL Comments: Pt regretful for having had surgery, encouraged pt that she is making progress and will continue to improve.      Vision                     Perception     Praxis      Cognition   Behavior During Therapy: Flat affect (tearful at times) Overall Cognitive Status: Impaired/Different from baseline Area of Impairment: Orientation;Attention;Memory;Following commands;Awareness Orientation Level: Time Current Attention Level: Sustained Memory: Decreased recall of precautions;Decreased short-term memory  Following Commands: Follows one step commands with increased time (with verbal and tactile cues) Safety/Judgement: Decreased awareness of safety;Decreased awareness of deficits     General Comments: Pt much more alert, but frequently keeps eyes closed.    Extremity/Trunk Assessment               Exercises     Shoulder Instructions       General  Comments      Pertinent Vitals/ Pain       Pain Assessment: Faces Faces Pain Scale: Hurts little more Pain Location: back, LEs Pain Descriptors / Indicators: Grimacing;Guarding Pain Intervention(s): Monitored during session;Repositioned  Home Living                                          Prior  Functioning/Environment              Frequency Min 2X/week     Progress Toward Goals  OT Goals(current goals can now be found in the care plan section)  Progress towards OT goals: Progressing toward goals  Acute Rehab OT Goals Patient Stated Goal: to do better Time For Goal Achievement: 08/29/15 Potential to Achieve Goals: [place] Discharge plan remains appropriate    Co-evaluation    PT/OT/SLP Co-Evaluation/Treatment: Yes Reason for Co-Treatment: For patient/therapist safety   OT goals addressed during session: ADL's and self-care      End of Session Equipment Utilized During Treatment: Rolling walker;Back brace   Activity Tolerance Patient tolerated treatment well   Patient Left in chair;with call bell/phone within reach;with chair alarm set   Nurse Communication          Time: [phone] OT Time Calculation (min): 30 min  Charges: OT General Charges $OT Visit: 1 Procedure OT Treatments $Self Care/Home Management : 8-22 mins  Malka So 08/20/2015, 10:09 AM  [phone]

## 2015-08-20 NOTE — Progress Notes (Signed)
Physical Therapy Treatment Patient Details Name: Elizabeth Thompson MRN: [id number] DOB: 1951-04-22 Today's Date: 08/20/2015    History of Present Illness Pt is a 65 y/o F dx w/ lumbar spinal stenosis w/ disc herniations L3-S1 Rt side.  Pt is now s/p lumbar decompression microdiscectomy L3-S1.  [name] PMH inlcudes HTN, bell's palsy, glaucoma, schizo-affective psychosis, memory disorder, bipolar disorder, frequency of urination, DM.     PT Comments    Elizabeth Thompson was more alert this session and was able to ambulate 8 ft in room w/ min +2 assist.  She became tearful and expresses that she wishes she was stronger and could do more.  She is making modest progress toward her PT goals. Pt will benefit from continued skilled PT services to increase functional independence and safety.   Follow Up Recommendations  SNF;Supervision/Assistance - 24 hour     Equipment Recommendations  Other (comment) (TBD at next venue of care)    Recommendations for Other Services       Precautions / Restrictions Precautions Precautions: Fall;Back Precaution Comments: Pt able to state 1/3 back precautions, reeducated. Required Braces or Orthoses: Spinal Brace Spinal Brace: Applied in sitting position;Lumbar corset Other Brace/Splint: Lumbar brace donned in sitting w/ total assist. Restrictions Weight Bearing Restrictions: No    Mobility  Bed Mobility Overal bed mobility: Needs Assistance;+2 for physical assistance Bed Mobility: Rolling;Sidelying to Sit Rolling: +2 for physical assistance;Mod assist Sidelying to sit: +2 for physical assistance;HOB elevated;Mod assist       General bed mobility comments: HOB slightly elevated and mod +2 assist supporting trunk posteriorly during rolling and sitting and use of bed pad to align pelvis.   Transfers Overall transfer level: Needs assistance Equipment used: Rolling walker (2 wheeled) Transfers: Sit to/from [facility] Sit to Stand: +2 physical  assistance;Min assist Stand pivot transfers: +2 physical assistance;Min assist       General transfer comment: Assist to power up to standing and to maintain balance as pt has tendency to flex trunk.  Cues for hand placement, especially during stand>sit.  Ambulation/Gait Ambulation/Gait assistance: Min assist;+2 physical assistance;+2 safety/equipment Ambulation Distance (Feet): 8 Feet Assistive device: Rolling walker (2 wheeled) Gait Pattern/deviations: Step-through pattern;Decreased stride length;Shuffle;Antalgic;Trunk flexed   Gait velocity interpretation: <1.8 ft/sec, indicative of risk for recurrent falls General Gait Details: Short shuffling steps w/ trunk flexed and max encouragement.  Assist w/ moving RW forward as pt continues to keep it stationary despite verbal and tactile cues.  Pt fatigues quickly and requests to sit.     Stairs            Wheelchair Mobility    Modified Rankin (Stroke Patients Only)       Balance Overall balance assessment: Needs assistance Sitting-balance support: Bilateral upper extremity supported;Feet supported Sitting balance-Leahy Scale: Poor Sitting balance - Comments: posterior lean when using UEs to donn back brace Postural control: Posterior lean   Standing balance-Leahy Scale: Poor Standing balance comment: Relies heavily on RW for support                    Cognition Arousal/Alertness: Lethargic;Awake/alert (needs verbal cues to keep eyes open) Behavior During Therapy: Flat affect (tearful at times) Overall Cognitive Status: Impaired/Different from baseline Area of Impairment: Attention;Following commands;Safety/judgement;Orientation Orientation Level: Disoriented to;Time Current Attention Level: Sustained Memory: Decreased recall of precautions;Decreased short-term memory Following Commands: Follows one step commands with increased time Safety/Judgement: Decreased awareness of safety;Decreased awareness of  deficits     General Comments:  Pt is more alert this session but requires verbal cues often to keep eyes open    Exercises General Exercises - Lower Extremity Ankle Circles/Pumps: AROM;Both;10 reps;Seated Gluteal Sets: Strengthening;Both;Seated;10 reps Long Arc Quad: AROM;Both;10 reps;Seated    General Comments        Pertinent Vitals/Pain Pain Assessment: Faces Faces Pain Scale: Hurts little more Pain Location: back, Bil LEs Pain Descriptors / Indicators: Grimacing;Guarding Pain Intervention(s): Limited activity within patient's tolerance;Monitored during session;Repositioned    Home Living Family/patient expects to be discharged to:: Private residence Living Arrangements: Other relatives                  Prior Function            PT Goals (current goals can now be found in the care plan section) Acute Rehab PT Goals Patient Stated Goal: to do better PT Goal Formulation: With patient/family Time For Goal Achievement: 08/29/15 Potential to Achieve Goals: Good Progress towards PT goals: Progressing toward goals    Frequency  Min 5X/week    PT Plan Current plan remains appropriate    Co-evaluation   Reason for Co-Treatment: For patient/therapist safety PT goals addressed during session: Mobility/safety with mobility;Balance;Proper use of DME;Strengthening/ROM OT goals addressed during session: ADL's and self-care     End of Session Equipment Utilized During Treatment: Back brace Activity Tolerance: Patient limited by lethargy;Patient limited by fatigue;Patient limited by pain Patient left: in chair;with call bell/phone within reach     Time: 0920-0950 PT Time Calculation (min) (ACUTE ONLY): 30 min  Charges:  $Gait Training: 8-22 mins                    G Codes:      Elizabeth Thompson PT, [place] [phone] Pager: [phone] 08/20/2015, 12:10 PM

## 2015-08-20 NOTE — Clinical Social Work Placement (Deleted)
CLINICAL SOCIAL WORK PLACEMENT  NOTE  Date:  08/20/2015  Patient Details  Name: Elizabeth Thompson MRN: [id number] Date of Birth: 1951/06/03  Clinical Social Work is seeking post-discharge placement for this patient at the [place] level of care (*CSW will initial, date and re-position this form in  chart as items are completed):  Yes   Patient/family provided with [facility] Work Department's list of facilities offering this level of care within the geographic area requested by the patient (or if unable, by the patient's family).  Yes   Patient/family informed of their freedom to choose among providers that offer the needed level of care, that participate in Medicare, Medicaid or managed care program needed by the patient, have an available bed and are willing to accept the patient.  Yes   Patient/family informed of [facility]'s ownership interest in [facility] and [facility], as well as of the fact that they are under no obligation to receive care at these facilities.  PASRR submitted to EDS on 08/16/15     PASRR number received on       Existing PASRR number confirmed on       FL2 transmitted to all facilities in geographic area requested by pt/family on 08/17/15     FL2 transmitted to all facilities within larger geographic area on 08/17/15     Patient informed that his/her managed care company has contracts with or will negotiate with certain facilities, including the following:        Yes   Patient/family informed of bed offers received.  Patient chooses bed at [facility]     Physician recommends and patient chooses bed at  (n/a)    Patient to be transferred to [facility] on 08/20/15.  Patient to be transferred to facility by [facility]     Patient family notified on 08/20/15 of transfer.  Name of family member notified:  Patient's husband updated.     PHYSICIAN  (Please sign 30 day note)     Additional Comment:     _______________________________________________ Caroline Sauger, LCSW 08/20/2015, 1:14 PM

## 2015-08-20 NOTE — Discharge Instructions (Signed)
Keep dressing clean and dry. Ok to shower  No bathing Ambulate with assistance as much as possible Ok to remove brace when in bed or supportive chair F/u in 1 week for wound check

## 2015-08-21 ENCOUNTER — Non-Acute Institutional Stay (SKILLED_NURSING_FACILITY): Payer: Medicare Other | Admitting: Internal Medicine

## 2015-08-21 ENCOUNTER — Encounter: Payer: Self-pay | Admitting: Internal Medicine

## 2015-08-21 ENCOUNTER — Other Ambulatory Visit: Payer: Self-pay

## 2015-08-21 DIAGNOSIS — M4806 Spinal stenosis, lumbar region: Secondary | ICD-10-CM | POA: Diagnosis not present

## 2015-08-21 DIAGNOSIS — R260 Ataxic gait: Secondary | ICD-10-CM

## 2015-08-21 DIAGNOSIS — M48061 Spinal stenosis, lumbar region without neurogenic claudication: Secondary | ICD-10-CM

## 2015-08-21 LAB — HEMOGLOBIN A1C
HEMOGLOBIN A1C: 6.6 % — AB (ref 4.8–5.6)
Mean Plasma Glucose: 143 mg/dL

## 2015-08-21 MED ORDER — OXYCODONE-ACETAMINOPHEN 10-325 MG PO TABS: ORAL_TABLET | ORAL | Status: DC

## 2015-08-21 NOTE — Telephone Encounter (Signed)
Rx faxed to [facility] @ [phone], phone number [phone]

## 2015-08-27 NOTE — Progress Notes (Addendum)
Patient ID: Elizabeth Thompson, female   DOB: [birth year], 64 y.o.   MRN: [id number]                HISTORY & PHYSICAL  DATE:  08/21/2015            FACILITY: [facility]                    LEVEL OF CARE:   SNF   CHIEF COMPLAINT:  Admission to SNF, post stay at [facility], 08/14/2015 through 08/20/2015.     HISTORY OF PRESENT ILLNESS:  This is a 64 year-old, type 2 diabetic on oral agents.    She started to complain of pain in the right hip and leg area around December of last year.  This progressed to the point that she was having falls in February.  She seems to have come to the attention of Dr. Jannifer Franklin in September 2015.  At that point, she was noted to have a memory disorder.  She had an MRI of the brain last year that showed minimal white matter disease.  At some point, the pain also became associated in the left leg, predominantly down the back of her leg.  Her gait became increasingly unstable.  Her niece, who lives with her, states that at some point she would even fall backwards while sitting.  She had a nice work-up by Dr. Jannifer Franklin with EMGs showing no evidence of peripheral neuropathy.  EMG of the right lower extremity showed multilevel denervation involving the L2/S1 nerve roots and also similar findings on the left side, ultimately coming to the attention of Neurosurgery, Dr. Rolena Infante, and she underwent a micro-discectomy, L3 to S1.  This was for lumbar decompression.  She does not have any sensory abnormalities in her legs, no pins and needles.  She has urinary frequency, but no incontinence.  No fecal incontinence.    PAST MEDICAL HISTORY/PROBLEM LIST:             Hypertension.    Bell's palsy.    Glaucoma.    Schizoaffective disorder.    Memory loss.    Uterine fibroids.    Hypercalcemia.     Hyperlipidemia.     Chronic low back pain.    Bronchitis.    Bipolar disorder.    Gastroesophageal reflux.    Type 2 diabetes.     PAST SURGICAL HISTORY:               Lumbar decompression, micro-discectomy, 08/14/2015.    CURRENT MEDICATIONS:  Medication list is reviewed.                 Albuterol p.r.n.      Abilify 20 mg daily.     Aricept 5 q.d.       Amaryl 4 mg daily.     Atarax 50 mg at bedtime.     Combivent 1 puff q.6.     Lamictal 100 in the evening.    Xalatan ophthalmic.    Lisinopril 40 q.d.      Metformin 1000 b.i.d.       Robaxin 500 t.i.d. p.r.n. muscle spasms.    Remeron 7.5 at bedtime.    Zofran 4 mg q.8 hours p.r.n.    Percocet 10/325, 1 tablet every four hours as needed.    Zocor 40 mg daily.    Vitamin D3, 1000 daily.     SOCIAL HISTORY:  HOUSING:  Lives with her daughter in [place].    FUNCTIONAL STATUS:  She has both a walker and a wheelchair at home, but could barely walk for short distances until this surgery.  There had been multiple falls which seem to have abated recently, perhaps because of limited mobility.   TOBACCO USE:  She is a smoker.   ALCOHOL:  No alcohol use.    FAMILY HISTORY:             MOTHER:  Dementia.    FATHER:  Diabetes.    SIBLINGS:  Heart disease in two siblings.  Alzheimer's disease in a sister.    LABORATORY DATA:  Discharge hemoglobin 11.1, normochromic, normocytic anemia, white count  6.4.    Hemoglobin A1c 6.6.    Calcium 10.5, ionized calcium 1.42.    25-hydroxy vitamin D 38.    PTH level 24.    CBC Latest Ref Rng 08/20/2015 08/16/2015 08/09/2015  WBC 4.0 - 10.5 K/uL 6.4 13.7(H) 7.9  Hemoglobin 12.0 - 15.0 g/dL 11.1(L) 10.6(L) 12.7  Hematocrit 36.0 - 46.0 % 34.8(L) 33.3(L) 39.5  Platelets 150 - 400 K/uL 428(H) 357 372   BMP Latest Ref Rng 08/09/2015 09/27/2014 09/05/2014  Glucose 65 - 99 mg/dL 108(H) 147(H) 118(H)  BUN 6 - 20 mg/dL _0 Creatinine 0.44 - 1.00 mg/dL 0.96 1.0 0.87  BUN/Creat Ratio 11 - 26 - - 10(L)  Sodium 135 - 145 mmol/L 139 138 142  Potassium 3.5 - 5.1 mmol/L 4.3 4.3 4.5  Chloride 101 - 111 mmol/L 106 103 102  CO2 22  - 32 mmol/L _1 Calcium 8.9 - 10.3 mg/dL 10.1 10.5 11.3(H)      REVIEW OF SYSTEMS:    General: no weight loss            HEENT:  No headache or diplopia.   NECK:  No neck pain is described.   CHEST/RESPIRATORY:  No cough.  No sputum.    CARDIAC:  No exertional chest pain.    GI:  No nausea, vomiting, or diarrhea.   GU:  Urinary urgency for which she uses Depends to prevent incontinence.   MUSCULOSKELETAL:   She does not have joint pain.  Low back pain dating back years for which she had epidural injections until this decompression.   NEUROLOGICAL:  No numbness or tingling in her legs.  No problems in her arms.  No weakness or numbness in her arms.  Progressive gait disorder, as described.   PSYCHIATRIC:  Mental status:  Recently stable.  Memory loss.    PHYSICAL EXAMINATION:   VITAL SIGNS:     TEMPERATURE:  98.   PULSE:  84.   RESPIRATIONS:  20.     BLOOD PRESSURE:  120/80.     GENERAL APPEARANCE:  The patient is not in any distress.   Able to answer most of her own questions, although her niece frequently asks details.   HEENT:   MOUTH/THROAT:    Oral exam is normal.   CHEST/RESPIRATORY:  Clear air entry bilaterally.     CARDIOVASCULAR:   CARDIAC:  Heart sounds are normal.  There are no murmurs.   GASTROINTESTINAL:   ABDOMEN:  Soft, nontender.  No masses.     GENITOURINARY:   BLADDER:  Not enlarged.  There is no CVA tenderness.     MUSCULOSKELETAL:    BACK:  I could not look at her lower back as she has a brace on.  No  other active joints NEUROLOGICAL:   CRANIAL NERVES:  Her cranial nerves seem normal.    MOTOR/TONE:  There is no pronator drift in her arms.  No increased tone.    SENSATION/STRENGTH:  There is no sensory loss in her legs.   DEEP TENDON REFLEXES:  No Hoffman's reflex.   In her legs, she has 3+/5 hip flexor and abductor.  Knee flexion and extension is normal.  4/5 plantar and dorsiflexion.   BALANCE/GAIT:  She is able to bring herself to a standing  position.  Wide-based, very unsteady.  Shuffling attempt to walk.   PSYCHIATRIC:   MENTAL STATUS:    In spite of her impressive history, no affect disturbances noted.    ASSESSMENT/PLAN:                      Status post surgery for a lumbar micro-discectomy, decompression for multilevel spondylosis.  It is difficult to refute Dr. Jannifer Franklin' complete review of this.  Her EMGs were unremarkable for neuropathy, which is important to exclude a diabetic plexopathy or amyotrophy.     Type 2 diabetes without evidence of neuropathy.  Blood sugar of 132 this morning at 2:35.    Hypertension.  This appears to be under good control.    Hypercalcemia with a borderline low PTH level.  I am not planning to work this up here.  This does not appear to be PTH mediated   Hyperlipidemia.  On Zocor.  It would probably be worthwhile checking a CK at some point.     Psychiatric problems, schizoaffective disorder.  On Abilify.  She has no EPS that is obvious.  She is also on Lamictal, I believe for this.    The most worrisome part of this is the extreme gait ataxia.  This is going to take some work with physical therapy.  She is apparently up with her walker.

## 2015-09-11 ENCOUNTER — Non-Acute Institutional Stay (SKILLED_NURSING_FACILITY): Payer: Medicare Other | Admitting: Internal Medicine

## 2015-09-11 DIAGNOSIS — M4806 Spinal stenosis, lumbar region: Secondary | ICD-10-CM

## 2015-09-11 DIAGNOSIS — E875 Hyperkalemia: Secondary | ICD-10-CM

## 2015-09-11 DIAGNOSIS — R260 Ataxic gait: Secondary | ICD-10-CM

## 2015-09-11 DIAGNOSIS — M48061 Spinal stenosis, lumbar region without neurogenic claudication: Secondary | ICD-10-CM

## 2015-09-14 NOTE — Progress Notes (Addendum)
Patient ID: Elizabeth Thompson, female   DOB: Jan 13, 1951, 64 y.o.   MRN: [id number]                PROGRESS NOTE  DATE:  09/11/2015          FACILITY: [facility]               LEVEL OF CARE:   SNF   Acute Visit             CHIEF COMPLAINT:  Follow up admission from two weeks ago.     HISTORY OF PRESENT ILLNESS:  I admitted this patient to the facility on 08/21/2015.  She had surgery for lumbar spinal stenosis.  She underwent a micro-discectomy from L3 to L1.      She also had issues in the hospital with regards to urinary retention and hypercalcemia with a calcium of 10.5, an ionized calcium at 1.42, and a PTH level of 24.    She tells me that she is feeling much better.  This seems to be agreed upon by her family who is present.  She is walking with a walker.  She finds physical therapy tiring, but she feels like she is making good progress.     I was called last week to a report of a high potassium level at 5.5.  I repeated this on 09/06/2015 at 5.0.  Her calcium was normal at 10.    REVIEW OF SYSTEMS:    GENERAL:  She has not lost any weight.   HEENT:   No headache.    CHEST/RESPIRATORY:  No cough.  No sputum.    CARDIAC:  No exertional chest pain.   GI:  No nausea, vomiting, or diarrhea.              GU:  She has urinary frequency and urgency.   Says she is up three times at night, even before this surgery.  There was some question of urinary retention raised in the hospital.  She had a Foley catheter at one point.  This is not in place now.   MUSCULOSKELETAL:  She does not have joint pain, and her low back pain seems to be getting better. No long bone pain is reproted NEUROLOGICAL:  No numbness or tingling in her legs.  No problems with her arms.   PSYCHIATRIC:  Mental status is stable.    PHYSICAL EXAMINATION:   GENERAL APPEARANCE:  The patient looks well.    CHEST/RESPIRATORY:  Clear air entry bilaterally.    CARDIOVASCULAR:   CARDIAC:  Heart sounds are normal.  There  are no murmurs.   She appears to be euvolemic.    GASTROINTESTINAL:   ABDOMEN:  Soft, nontender.  No masses.     GENITOURINARY:   BLADDER:  Not enlarged.   There is no evidence of severe retention.   No CVA tenderness.     MUSCULOSKELETAL:   EXTREMITIES:  No active joints.   PSYCHIATRIC:   MENTAL STATUS:   No evidence of depression.      ASSESSMENT/PLAN:      Status post lumbar micro-discectomy surgery for multilevel spondylosis.    Type 2 diabetes.  This appears to be under good control.    Hypercalcemia.  I have repeated her calcium at 10.  I do not think there is any further work-up here that needs to be done.    Hyperlipidemia.  On Zocor.  Her CK was checked and was normal.  History of schizoaffective disorder.   On Abilify and Lamictal.  This seems very stable, as well.    She will continue with rehabilitation.  I have not heard any discharge plans.

## 2015-09-30 ENCOUNTER — Non-Acute Institutional Stay (SKILLED_NURSING_FACILITY): Payer: Medicare Other | Admitting: Internal Medicine

## 2015-09-30 DIAGNOSIS — M4806 Spinal stenosis, lumbar region: Secondary | ICD-10-CM

## 2015-09-30 DIAGNOSIS — R260 Ataxic gait: Secondary | ICD-10-CM | POA: Diagnosis not present

## 2015-09-30 DIAGNOSIS — G2401 Drug induced subacute dyskinesia: Secondary | ICD-10-CM | POA: Diagnosis not present

## 2015-09-30 DIAGNOSIS — J449 Chronic obstructive pulmonary disease, unspecified: Secondary | ICD-10-CM | POA: Diagnosis not present

## 2015-09-30 DIAGNOSIS — M48061 Spinal stenosis, lumbar region without neurogenic claudication: Secondary | ICD-10-CM

## 2015-10-03 NOTE — Progress Notes (Signed)
Patient ID: Elizabeth Thompson, female   DOB: 1951-04-27, 64 y.o.   MRN: [id number]                PROGRESS NOTE  DATE:  09/30/2015           FACILITY: [facility]              LEVEL OF CARE:   SNF   Routine Visit            CHIEF COMPLAINT:  Visit to monitor medical issues, including diabetes.     HISTORY OF PRESENT ILLNESS:  This is a patient who had lumbar spinal stenosis with increasing pain on ambulation.   She underwent a microdiscectomy from L3 to S1.  She has done quite well here.  She is up ambulating with a walker, much steadier than when she came in.  She still states she gets some pain in her anterior thighs bilaterally if she walks too far, but generally she does not seem to be limited.    Her type 2 diabetes seems to be very stable.  Blood sugars yesterday were 104, 105, 99, 104.  Today so far:  98, 83.     PAST MEDICAL HISTORY/PROBLEM LIST:  Reviewed.    CURRENT MEDICATIONS:  Medication list is reviewed.               Amaryl 4 mg q.a.m.     Metformin 1000 b.i.d.     Lisinopril 40 mg daily.    Zocor 40 q.d.     Vitamin D3, 10,000 U daily.     Combivent 1 puff every 6 hours.    Xalatan ophthalmic.    Imipramine 100 mg at bedtime.     Hydroxyzine 50 mg at bedtime for her anxiety.    Abilify 20 mg at night.    Aricept 5 mg q.h.s.     Remeron 7.5 q.h.s.     REVIEW OF SYSTEMS:    GENERAL:  No weight loss.    HEENT:   No headaches or diplopia.   NECK:  No neck pain.   CHEST/RESPIRATORY:  No cough.  No sputum.    CARDIAC:  No exertional chest pain or palpitations.    GI:  No nausea, vomiting, or diarrhea.      GU:  Urinary urgency and incontinence.  This is not a new issue.  She has no dysuria.   MUSCULOSKELETAL:  She tells me she wakes up in the morning with pain in her upper back and shoulders.  With activity, this gets better.   NEUROLOGICAL:  No numbness or tingling in her legs.  No problems with her arms.  No weakness.    PSYCHIATRIC:  Mental  status:  Quite stable.     PHYSICAL EXAMINATION:   CHEST/RESPIRATORY:  Clear air entry bilaterally.    CARDIOVASCULAR:   CARDIAC:  Heart sounds are normal.  There are no murmurs.    GASTROINTESTINAL:   ABDOMEN:  Soft.  No tenderness.  No masses.   GENITOURINARY:   BLADDER:  Not enlarged.     MUSCULOSKELETAL:   BACK:  She has a brace on.  I did not look at her back.   NEUROLOGICAL:    SENSATION/STRENGTH:  Power and tone seem normal.    BALANCE/GAIT:  She is able to bring herself to a standing position.  Her wide-based, unsteady gait she had seems to have resolved.  She uses a rolling walker and appears to be doing very  well.    PSYCHIATRIC:   MENTAL STATUS:  We really have had no issue here in spite of the impressive history.     ASSESSMENT/PLAN:             Lumbar spinal stenosis.  She is really doing nicely.  Much better ambulatory status than when she came in.    Type 2 diabetes.  I think this is under good control, almost too fine.     Hypertension.  Also under good control.    Psychiatric issues, schizophrenia.  On Abilify.  She may have mild tardive dyskinesia.  I did not note that on presentation.  I am going to consider reducing the burden of psychiatric medications that she is on.  I do not see any need for the hydroxyzine.  She is labeled as being schizophrenic in some statements, and bipolar in others.   She is on Aricept, as well.  I really wonder whether this is necessary.    Probable COPD.  She just quit smoking in August.  She said she is episodically wheezing.  She is on Combivent routinely.  This may not be necessary.  She probably should have PFTs done as an outpatient.     CPT CODE: [id number]

## 2015-10-10 ENCOUNTER — Other Ambulatory Visit: Payer: Self-pay

## 2015-10-10 MED ORDER — OXYCODONE-ACETAMINOPHEN 5-325 MG PO TABS: 1.0000 | ORAL_TABLET | Freq: Four times a day (QID) | ORAL | Status: DC | PRN

## 2015-10-10 MED ORDER — OXYCODONE-ACETAMINOPHEN 10-325 MG PO TABS: ORAL_TABLET | ORAL | Status: DC

## 2015-10-10 NOTE — Telephone Encounter (Signed)
Rx faxed to [facility] @ [phone], phone number [phone]  

## 2015-10-31 DIAGNOSIS — M4806 Spinal stenosis, lumbar region: Secondary | ICD-10-CM

## 2015-10-31 DIAGNOSIS — E119 Type 2 diabetes mellitus without complications: Secondary | ICD-10-CM | POA: Diagnosis not present

## 2015-10-31 DIAGNOSIS — F209 Schizophrenia, unspecified: Secondary | ICD-10-CM

## 2015-10-31 DIAGNOSIS — J449 Chronic obstructive pulmonary disease, unspecified: Secondary | ICD-10-CM | POA: Diagnosis not present

## 2015-10-31 DIAGNOSIS — I10 Essential (primary) hypertension: Secondary | ICD-10-CM | POA: Diagnosis not present

## 2015-10-31 DIAGNOSIS — F319 Bipolar disorder, unspecified: Secondary | ICD-10-CM | POA: Diagnosis not present

## 2015-11-06 ENCOUNTER — Emergency Department (HOSPITAL_COMMUNITY): Payer: Medicare Other

## 2015-11-06 ENCOUNTER — Encounter (HOSPITAL_COMMUNITY): Payer: Self-pay | Admitting: Emergency Medicine

## 2015-11-06 ENCOUNTER — Inpatient Hospital Stay (HOSPITAL_COMMUNITY)
Admission: EM | Admit: 2015-11-06 | Discharge: 2015-11-08 | DRG: 189 | Disposition: A | Discharge status: Home / Self Care | Payer: Medicare Other | Attending: Internal Medicine | Admitting: Internal Medicine

## 2015-11-06 DIAGNOSIS — I129 Hypertensive chronic kidney disease with stage 1 through stage 4 chronic kidney disease, or unspecified chronic kidney disease: Secondary | ICD-10-CM | POA: Diagnosis present

## 2015-11-06 DIAGNOSIS — N179 Acute kidney failure, unspecified: Secondary | ICD-10-CM

## 2015-11-06 DIAGNOSIS — E785 Hyperlipidemia, unspecified: Secondary | ICD-10-CM | POA: Diagnosis present

## 2015-11-06 DIAGNOSIS — Z833 Family history of diabetes mellitus: Secondary | ICD-10-CM

## 2015-11-06 DIAGNOSIS — R0902 Hypoxemia: Secondary | ICD-10-CM | POA: Diagnosis present

## 2015-11-06 DIAGNOSIS — F17210 Nicotine dependence, cigarettes, uncomplicated: Secondary | ICD-10-CM | POA: Diagnosis present

## 2015-11-06 DIAGNOSIS — J439 Emphysema, unspecified: Secondary | ICD-10-CM

## 2015-11-06 DIAGNOSIS — J9600 Acute respiratory failure, unspecified whether with hypoxia or hypercapnia: Secondary | ICD-10-CM | POA: Diagnosis present

## 2015-11-06 DIAGNOSIS — D649 Anemia, unspecified: Secondary | ICD-10-CM | POA: Diagnosis present

## 2015-11-06 DIAGNOSIS — F319 Bipolar disorder, unspecified: Secondary | ICD-10-CM | POA: Diagnosis present

## 2015-11-06 DIAGNOSIS — Z8249 Family history of ischemic heart disease and other diseases of the circulatory system: Secondary | ICD-10-CM | POA: Diagnosis not present

## 2015-11-06 DIAGNOSIS — M79606 Pain in leg, unspecified: Secondary | ICD-10-CM | POA: Diagnosis not present

## 2015-11-06 DIAGNOSIS — N189 Chronic kidney disease, unspecified: Secondary | ICD-10-CM | POA: Diagnosis present

## 2015-11-06 DIAGNOSIS — R911 Solitary pulmonary nodule: Secondary | ICD-10-CM | POA: Diagnosis not present

## 2015-11-06 DIAGNOSIS — E119 Type 2 diabetes mellitus without complications: Secondary | ICD-10-CM | POA: Diagnosis present

## 2015-11-06 DIAGNOSIS — H409 Unspecified glaucoma: Secondary | ICD-10-CM | POA: Diagnosis present

## 2015-11-06 DIAGNOSIS — F259 Schizoaffective disorder, unspecified: Secondary | ICD-10-CM | POA: Diagnosis present

## 2015-11-06 DIAGNOSIS — Z810 Family history of intellectual disabilities: Secondary | ICD-10-CM | POA: Diagnosis not present

## 2015-11-06 DIAGNOSIS — J441 Chronic obstructive pulmonary disease with (acute) exacerbation: Secondary | ICD-10-CM | POA: Diagnosis present

## 2015-11-06 DIAGNOSIS — J9601 Acute respiratory failure with hypoxia: Secondary | ICD-10-CM | POA: Diagnosis present

## 2015-11-06 DIAGNOSIS — R0602 Shortness of breath: Secondary | ICD-10-CM

## 2015-11-06 DIAGNOSIS — K219 Gastro-esophageal reflux disease without esophagitis: Secondary | ICD-10-CM | POA: Diagnosis present

## 2015-11-06 DIAGNOSIS — R0600 Dyspnea, unspecified: Secondary | ICD-10-CM | POA: Diagnosis not present

## 2015-11-06 LAB — CBC
HCT: 36.9 % (ref 36.0–46.0)
Hemoglobin: 11.7 g/dL — ABNORMAL LOW (ref 12.0–15.0)
MCH: 26.5 pg (ref 26.0–34.0)
MCHC: 31.7 g/dL (ref 30.0–36.0)
MCV: 83.5 fL (ref 78.0–100.0)
PLATELETS: 395 10*3/uL (ref 150–400)
RBC: 4.42 MIL/uL (ref 3.87–5.11)
RDW: 14.9 % (ref 11.5–15.5)
WBC: 7.3 10*3/uL (ref 4.0–10.5)

## 2015-11-06 LAB — GLUCOSE, CAPILLARY
Glucose-Capillary: 58 mg/dL — ABNORMAL LOW (ref 65–99)
Glucose-Capillary: 124 mg/dL — ABNORMAL HIGH (ref 65–99)

## 2015-11-06 LAB — BASIC METABOLIC PANEL
Anion gap: 9 (ref 5–15)
BUN: 21 mg/dL — ABNORMAL HIGH (ref 6–20)
CALCIUM: 10.4 mg/dL — AB (ref 8.9–10.3)
CHLORIDE: 109 mmol/L (ref 101–111)
CO2: 24 mmol/L (ref 22–32)
CREATININE: 1.69 mg/dL — AB (ref 0.44–1.00)
GFR calc non Af Amer: 31 mL/min — ABNORMAL LOW (ref >60)
GFR, EST AFRICAN AMERICAN: 36 mL/min — AB (ref >60)
Glucose, Bld: 85 mg/dL (ref 65–99)
Potassium: 5.1 mmol/L (ref 3.5–5.1)
SODIUM: 142 mmol/L (ref 135–145)

## 2015-11-06 LAB — I-STAT TROPONIN, ED: TROPONIN I, POC: 0 ng/mL (ref 0.00–0.08)

## 2015-11-06 MED ORDER — ALBUTEROL SULFATE (2.5 MG/3ML) 0.083% IN NEBU: 2.5000 mg | INHALATION_SOLUTION | Freq: Four times a day (QID) | RESPIRATORY_TRACT | Status: DC | PRN

## 2015-11-06 MED ORDER — LAMOTRIGINE 25 MG PO TABS
100.0000 mg | ORAL_TABLET | Freq: Every evening | ORAL | Status: DC
  Administered 2015-11-07 (×2): 100 mg via ORAL
  Filled 2015-11-06 (×2): qty 4

## 2015-11-06 MED ORDER — LATANOPROST 0.005 % OP SOLN
1.0000 [drp] | Freq: Every day | OPHTHALMIC | Status: DC
  Administered 2015-11-07 (×2): 1 [drp] via OPHTHALMIC
  Filled 2015-11-06: qty 2.5

## 2015-11-06 MED ORDER — ACETAMINOPHEN 325 MG PO TABS
650.0000 mg | ORAL_TABLET | Freq: Four times a day (QID) | ORAL | Status: DC | PRN
  Administered 2015-11-07: 650 mg via ORAL
  Filled 2015-11-06: qty 2

## 2015-11-06 MED ORDER — SELENIUM SULF-PYRITHIONE-UREA 2.25 % EX SHAM: 1.0000 "application " | MEDICATED_SHAMPOO | CUTANEOUS | Status: DC

## 2015-11-06 MED ORDER — IPRATROPIUM-ALBUTEROL 0.5-2.5 (3) MG/3ML IN SOLN
3.0000 mL | Freq: Four times a day (QID) | RESPIRATORY_TRACT | Status: DC
  Administered 2015-11-07 – 2015-11-08 (×5): 3 mL via RESPIRATORY_TRACT
  Filled 2015-11-06 (×5): qty 3

## 2015-11-06 MED ORDER — ARIPIPRAZOLE 10 MG PO TABS
20.0000 mg | ORAL_TABLET | Freq: Every day | ORAL | Status: DC
  Administered 2015-11-07 (×2): 20 mg via ORAL
  Filled 2015-11-06 (×3): qty 2

## 2015-11-06 MED ORDER — GLIMEPIRIDE 4 MG PO TABS
4.0000 mg | ORAL_TABLET | Freq: Every day | ORAL | Status: DC
  Administered 2015-11-07 – 2015-11-08 (×2): 4 mg via ORAL
  Filled 2015-11-06 (×3): qty 1

## 2015-11-06 MED ORDER — MIRTAZAPINE 15 MG PO TABS
7.5000 mg | ORAL_TABLET | Freq: Every day | ORAL | Status: DC
  Administered 2015-11-07 (×2): 7.5 mg via ORAL
  Filled 2015-11-06 (×2): qty 1

## 2015-11-06 MED ORDER — DONEPEZIL HCL 5 MG PO TABS
5.0000 mg | ORAL_TABLET | Freq: Every day | ORAL | Status: DC
  Administered 2015-11-07 (×2): 5 mg via ORAL
  Filled 2015-11-06 (×2): qty 1

## 2015-11-06 MED ORDER — OXYCODONE-ACETAMINOPHEN 5-325 MG PO TABS
1.0000 | ORAL_TABLET | Freq: Four times a day (QID) | ORAL | Status: DC | PRN
  Administered 2015-11-08: 1 via ORAL
  Filled 2015-11-06: qty 1

## 2015-11-06 MED ORDER — SODIUM CHLORIDE 0.9 % IV BOLUS (SEPSIS)
1000.0000 mL | Freq: Once | INTRAVENOUS | Status: AC
Start: 2015-11-06 — End: 2015-11-06
  Administered 2015-11-06: 1000 mL via INTRAVENOUS

## 2015-11-06 MED ORDER — SIMVASTATIN 40 MG PO TABS
40.0000 mg | ORAL_TABLET | Freq: Every evening | ORAL | Status: DC
  Administered 2015-11-07: 40 mg via ORAL
  Filled 2015-11-06: qty 1

## 2015-11-06 MED ORDER — INSULIN ASPART 100 UNIT/ML ~~LOC~~ SOLN
0.0000 [IU] | Freq: Three times a day (TID) | SUBCUTANEOUS | Status: DC
  Administered 2015-11-07 – 2015-11-08 (×3): 1 [IU] via SUBCUTANEOUS

## 2015-11-06 MED ORDER — SODIUM CHLORIDE 0.9 % IV SOLN
INTRAVENOUS | Status: AC
  Administered 2015-11-07: 03:00:00 via INTRAVENOUS

## 2015-11-06 MED ORDER — LAMOTRIGINE 25 MG PO TABS: 100.0000 mg | ORAL_TABLET | Freq: Every evening | ORAL | Status: DC

## 2015-11-06 MED ORDER — ONDANSETRON HCL 4 MG/2ML IJ SOLN: 4.0000 mg | Freq: Four times a day (QID) | INTRAMUSCULAR | Status: DC | PRN

## 2015-11-06 MED ORDER — HYDRALAZINE HCL 20 MG/ML IJ SOLN: 10.0000 mg | INTRAMUSCULAR | Status: DC | PRN

## 2015-11-06 MED ORDER — ONDANSETRON HCL 4 MG PO TABS: 4.0000 mg | ORAL_TABLET | Freq: Four times a day (QID) | ORAL | Status: DC | PRN

## 2015-11-06 MED ORDER — IOHEXOL 350 MG/ML SOLN
80.0000 mL | Freq: Once | INTRAVENOUS | Status: AC | PRN
  Administered 2015-11-06: 80 mL via INTRAVENOUS

## 2015-11-06 MED ORDER — METHOCARBAMOL 500 MG PO TABS
500.0000 mg | ORAL_TABLET | Freq: Three times a day (TID) | ORAL | Status: DC | PRN
  Filled 2015-11-06: qty 1

## 2015-11-06 MED ORDER — BUDESONIDE-FORMOTEROL FUMARATE 160-4.5 MCG/ACT IN AERO
2.0000 | INHALATION_SPRAY | Freq: Two times a day (BID) | RESPIRATORY_TRACT | Status: DC
  Administered 2015-11-07 – 2015-11-08 (×3): 2 via RESPIRATORY_TRACT
  Filled 2015-11-06: qty 6

## 2015-11-06 MED ORDER — ACETAMINOPHEN 650 MG RE SUPP: 650.0000 mg | Freq: Four times a day (QID) | RECTAL | Status: DC | PRN

## 2015-11-06 MED ORDER — ENOXAPARIN SODIUM 40 MG/0.4ML ~~LOC~~ SOLN
40.0000 mg | SUBCUTANEOUS | Status: DC
  Administered 2015-11-07 – 2015-11-08 (×2): 40 mg via SUBCUTANEOUS
  Filled 2015-11-06 (×2): qty 0.4

## 2015-11-06 NOTE — ED Provider Notes (Signed)
CSN: [id number]     Arrival date & time 11/06/15  1554 History   First MD Initiated Contact with Patient 11/06/15 1649     Chief Complaint  Patient presents with  . Shortness of Breath    Patient is a 64 y.o. female presenting with shortness of breath. The history is provided by the patient.  Shortness of Breath Severity:  Moderate Onset quality:  Gradual Duration:  3 days Timing:  Constant Progression:  Worsening Chronicity:  New Relieved by:  Oxygen Worsened by:  Activity and exertion Associated symptoms: no abdominal pain, no chest pain, no cough, no diaphoresis, no fever, no headaches, no hemoptysis, no neck pain, no rash, no sore throat and no vomiting     Past Medical History  Diagnosis Date  . Hypertension   . Bell's palsy   . Glaucoma   . Schizo-affective psychosis ([place])   . Memory disorder 09/05/2014  . Uterine fibroid   . Hypercalcemia   . Hyperlipidemia   . Back pain   . Chronic low back pain 05/09/2015  . Bronchitis   . Bipolar disorder ([place])   . Frequency of urination   . Cyst of right kidney   . GERD (gastroesophageal reflux disease)   . Arthritis   . Diabetes mellitus without complication ([place])     type 2   Past Surgical History  Procedure Laterality Date  . Abdominal hysterectomy    . Tonsillectomy and adenoidectomy    . Appendectomy    . Colonoscopy w/ polypectomy    . Lumbar laminectomy/decompression microdiscectomy N/A 08/14/2015    Procedure: LUMBAR DECOMPRESSION MICRODISCECTOMY L3-S1;  Surgeon: Melina Schools, MD;  Location: [place];  Service: Orthopedics;  Laterality: N/A;   Family History  Problem Relation Age of Onset  . Dementia Mother   . Alzheimer's disease Mother   . Heart disease Mother   . Hypertension Mother   . Diabetes Mother   . Diabetes Father   . Alzheimer's disease Sister   . Heart disease Brother   . Heart attack Brother    Social History  Substance Use Topics  . Smoking status: Current Every Day Smoker -- 0.50 packs/day  for 28 years    Types: Cigarettes  . Smokeless tobacco: Never Used  . Alcohol Use: No   OB History    No data available     Review of Systems  Constitutional: Negative for fever and diaphoresis.  HENT: Negative for rhinorrhea and sore throat.   Eyes: Negative for visual disturbance.  Respiratory: Positive for shortness of breath. Negative for cough, hemoptysis and chest tightness.   Cardiovascular: Negative for chest pain and palpitations.  Gastrointestinal: Negative for nausea, vomiting, abdominal pain and constipation.  Genitourinary: Negative for dysuria and hematuria.  Musculoskeletal: Negative for back pain and neck pain.  Skin: Negative for rash.  Neurological: Negative for dizziness and headaches.  Psychiatric/Behavioral: Negative for confusion.  All other systems reviewed and are negative.  Allergies  Codeine and Prednisone  Home Medications   Prior to Admission medications   Medication Sig Start Date End Date Taking? Authorizing Provider  albuterol (PROVENTIL HFA;VENTOLIN HFA) 108 (90 BASE) MCG/ACT inhaler Inhale 2 puffs into the lungs every 6 (six) hours as needed for wheezing or shortness of breath. 08/27/14  Yes Kerrie Buffalo, NP  albuterol (PROVENTIL) (2.5 MG/3ML) 0.083% nebulizer solution Take 2.5 mg by nebulization every 6 (six) hours as needed for wheezing or shortness of breath.   Yes Historical Provider, MD  ARIPiprazole (ABILIFY) 20  MG tablet Take 1 tablet (20 mg total) by mouth at bedtime. 08/27/14  Yes Kerrie Buffalo, NP  budesonide-formoterol [facility]) 160-4.5 MCG/ACT inhaler Inhale 2 puffs into the lungs 2 (two) times daily.   Yes Historical Provider, MD  Cholecalciferol (VITAMIN D3 PO) Take 1 tablet by mouth daily.   Yes Historical Provider, MD  donepezil (ARICEPT) 5 MG tablet Take 1 tablet (5 mg total) by mouth at bedtime. 08/27/14  Yes Kerrie Buffalo, NP  glimepiride (AMARYL) 4 MG tablet Take 1 tablet (4 mg total) by mouth daily with breakfast. 08/27/14   Yes Kerrie Buffalo, NP  hydrOXYzine (ATARAX/VISTARIL) 50 MG tablet Take 50 mg by mouth at bedtime.   Yes Historical Provider, MD  ibuprofen (ADVIL,MOTRIN) 200 MG tablet Take 200 mg by mouth every 6 (six) hours as needed for headache, mild pain or moderate pain.   Yes Historical Provider, MD  Ipratropium-Albuterol (COMBIVENT) 20-100 MCG/ACT AERS respimat Inhale 1 puff into the lungs every 6 (six) hours.   Yes Historical Provider, MD  lamoTRIgine (LAMICTAL) 100 MG tablet Take 1 tablet (100 mg total) by mouth every evening. 08/27/14  Yes Kerrie Buffalo, NP  latanoprost (XALATAN) 0.005 % ophthalmic solution Place 1 drop into both eyes at bedtime. 08/27/14  Yes Kerrie Buffalo, NP  lisinopril (PRINIVIL,ZESTRIL) 40 MG tablet Take 1 tablet (40 mg total) by mouth daily. 08/27/14  Yes Kerrie Buffalo, NP  metFORMIN (GLUCOPHAGE) 1000 MG tablet Take 1 tablet (1,000 mg total) by mouth 2 (two) times daily with a meal. 08/27/14  Yes Kerrie Buffalo, NP  methocarbamol (ROBAXIN) 500 MG tablet Take 1 tablet (500 mg total) by mouth 3 (three) times daily as needed for muscle spasms. 08/14/15  Yes Melina Schools, MD  mirtazapine (REMERON) 7.5 MG tablet Take 1 tablet (7.5 mg total) by mouth at bedtime. 08/27/14  Yes Kerrie Buffalo, NP  ondansetron (ZOFRAN) 4 MG tablet Take 1 tablet (4 mg total) by mouth every 8 (eight) hours as needed for nausea or vomiting. 08/14/15  Yes Melina Schools, MD  oxyCODONE-acetaminophen (ROXICET) 5-325 MG tablet Take 1 tablet by mouth every 6 (six) hours as needed for severe pain. DO NOT EXCEED 4GM OF APAP IN 24 HOURS FROM ALL SOURCES 10/10/15  Yes Lauree Chandler, NP  Selenium Sulf-Pyrithione-Urea 2.25 % SHAM Apply 1 application topically once a week. Applied to scalp for dermatitis 01/25/15  Yes Historical Provider, MD  simvastatin (ZOCOR) 40 MG tablet Take 1 tablet (40 mg total) by mouth daily. Patient taking differently: Take 40 mg by mouth every evening.  08/27/14  Yes Kerrie Buffalo, NP   oxyCODONE-acetaminophen (PERCOCET) 10-325 MG tablet 1 by mouth every 6 hours as needed for pain DO NOT EXCEED 4 GM APAP IN 24 HOURS Patient not taking: Reported on 11/06/2015 10/10/15   Tiffany L Reed, DO   BP 132/61 mmHg  Pulse 91  Temp(Src) 98.1 F (36.7 C) (Oral)  Resp 18  Ht 5' 3" (1.6 m)  Wt 86.183 kg  BMI 33.67 kg/m2  SpO2 96% Physical Exam  Constitutional: She is oriented to person, place, and time. She appears well-developed and well-nourished. No distress.  HENT:  Head: Normocephalic and atraumatic.  Mouth/Throat: Oropharynx is clear and moist.  Eyes: EOM are normal. Pupils are equal, round, and reactive to light.  Neck: Neck supple. No JVD present.  Cardiovascular: Normal rate, regular rhythm, normal heart sounds and intact distal pulses.  Exam reveals no gallop.   No murmur heard. Pulmonary/Chest: Effort normal and breath sounds normal. She  has no wheezes. She has no rales.  Abdominal: Soft. She exhibits no distension. There is no tenderness.  Musculoskeletal: Normal range of motion. She exhibits no tenderness.  Neurological: She is alert and oriented to person, place, and time. No cranial nerve deficit. She exhibits normal muscle tone.  Skin: Skin is warm and dry. No rash noted.  Psychiatric: Her behavior is normal.    ED Course  Procedures  None   Labs Review Labs Reviewed  BASIC METABOLIC PANEL - Abnormal; Notable for the following:    BUN 21 (*)    Creatinine, Ser 1.69 (*)    Calcium 10.4 (*)    GFR calc non Af Amer 31 (*)    GFR calc Af Amer 36 (*)    All other components within normal limits  CBC - Abnormal; Notable for the following:    Hemoglobin 11.7 (*)    All other components within normal limits  GLUCOSE, CAPILLARY - Abnormal; Notable for the following:    Glucose-Capillary 58 (*)    All other components within normal limits  I-STAT TROPOININ, ED    Imaging Review Dg Chest 2 View  11/06/2015  CLINICAL DATA:  Shortness of breath. EXAM:  CHEST  2 VIEW COMPARISON:  November 26, 2009. FINDINGS: The heart size and mediastinal contours are within normal limits. Both lungs are clear. No pneumothorax or pleural effusion is noted. The visualized skeletal structures are unremarkable. IMPRESSION: No active cardiopulmonary disease. Electronically Signed   By: Marijo Conception, M.D.   On: 11/06/2015 17:52   Ct Angio Chest Pe W/cm &/or Wo Cm  11/06/2015  CLINICAL DATA:  Shortness of Breath EXAM: CT ANGIOGRAPHY CHEST WITH CONTRAST TECHNIQUE: Multidetector CT imaging of the chest was performed using the standard protocol during bolus administration of intravenous contrast. Multiplanar CT image reconstructions and MIPs were obtained to evaluate the vascular anatomy. CONTRAST:  15m OMNIPAQUE IOHEXOL 350 MG/ML SOLN COMPARISON:  Chest radiograph November 06, 2015 FINDINGS: There is no demonstrable pulmonary embolus. There are foci of atherosclerotic calcification in the aorta. There is no thoracic aortic aneurysm or dissection. The visualized great vessels appear unremarkable. There is a degree of centrilobular emphysematous change. There is no parenchymal lung edema or consolidation. On axial slice 23 series 6621 there is a 4 mm nodular opacity in the periphery of the anterior segment of the right upper lobe. Visualized thyroid is unremarkable. There are prominent lymph nodes in the right axillary region, incompletely visualized. Largest completely visualized right axillary lymph node measures 2.6 x 1.6 cm. There are prominent lymph nodes scattered throughout the mediastinum. There is a right pretracheal lymph node measuring 1.6 x 1.6 cm. There is a lymph node in the lateral left hilar region measuring 1.6 x 1.4 cm. A second nearby lymph node measures 1.8 x 1.3 cm the left hilum. There is a lymph node in the sub- carinal region measuring 2.0 x 1.7 cm. Multiple smaller mediastinal lymph nodes are noted. Pericardium is not thickened. In the visualized upper  abdomen, there is cholelithiasis. There are cysts arising from the right kidney, largest measuring 2.8 x 2.2 cm. There is a mass arising from the medial upper pole of the left kidney measuring 1.8 x 1.4 cm which has attenuation values higher than is expected with a cyst. There is bilateral adrenal hypertrophy. There are no blastic or lytic bone lesions. There is degenerative change in the thoracic spine. Review of the MIP images confirms the above findings. IMPRESSION: No demonstrable pulmonary embolus.  Adenopathy of uncertain etiology. Underlying emphysema. No edema or consolidation. 4 mm nodular opacity anterior segment right upper lobe. Followup of this nodular opacity should be based on Fleischner Society guidelines. Given risk factors for bronchogenic carcinoma, follow-up chest CT at 1 year is recommended. This recommendation follows the consensus statement: Guidelines for Management of Small Pulmonary Nodules Detected on CT Scans: A Statement from the [employer] as published in Radiology 2005; 237:395-400. Mass upper pole left kidney which has attenuation values higher than simple cyst. Neoplasm involving the upper pole left kidney cannot be excluded. This lesion measures 1.8 x 1.4 cm. This finding may warrant pre and post contrast renal MR or CT to further assess. MR would be the optimal imaging study of choice to further assess this lesion. Cholelithiasis. Electronically Signed   By: Lowella Grip III M.D.   On: 11/06/2015 19:23   I have personally reviewed and evaluated these images and lab results as part of my medical decision-making.   EKG Interpretation   Date/Time:  Wednesday November 06 2015 16:04:07 EST Ventricular Rate:  98 PR Interval:  164 QRS Duration: 80 QT Interval:  382 QTC Calculation: 487 R Axis:   115 Text Interpretation:  Normal sinus rhythm Left posterior fascicular block  Abnormal ECG Confirmed by BEATON  MD, ROBERT ([id number]) on 11/06/2015 5:43:13  PM       MDM   Final diagnoses:  Hypoxemia  SOB (shortness of breath)  AKI (acute kidney injury) ([place])    64 yo AAF with a PMH of emphysema, HTN, HLD, DM and GERD who presents with SOB x 3 days. Seen at PCP 2 days ago and had +D dimer (0.87). Now with new O2 requirement. Concern for PE. Will get CTPA to assess for PE. New AKI. Will give fluids prior to CT.   I interpreted the EKG. NSR, rate 98, QTc 487, no ST elevation or depression, no wellens or brugada. Normal axis.   CXR unremarkable. CT neg for PE with incidental nodules and lymphadenopathy. Still hypoxic on room air. Will admit for further workup. Stable for the floor.  Discussed with Dr. Audie Pinto.  Gustavus Bryant, MD 11/06/15 [id number]  Leonard Schwartz, MD 11/18/15 [phone]

## 2015-11-06 NOTE — Discharge Instructions (Signed)
DISCONTINUE METFORMIN FOR 48 HRS

## 2015-11-06 NOTE — ED Notes (Signed)
Pt in CT.

## 2015-11-06 NOTE — ED Notes (Signed)
Pt sts she had sob yesterday and has had some swelling in L leg and foot. Pt nurse called today and told her to come to ER for doppler of leg and CT of chest.  Pt denies sob today. Pt sts she feels her normal self today.

## 2015-11-06 NOTE — ED Notes (Signed)
Attempted report 

## 2015-11-06 NOTE — H&P (Signed)
[facility] History and Physical  Elizabeth Thompson IWP:[id number] DOB: 06/17/1951 DOA: 11/06/2015  Referring physician: ER physician. PCP: Imelda Pillow, NP  Specialists: None.  Chief Complaint: Shortness of breath.  HPI: Elizabeth Thompson is a 64 y.o. female with history of hypertension, diabetes mellitus type 2, hypercalcemia, schizoaffective disorder and memory disorder was brought to the ER the patient was found to have increasingly short of breath. Patient has recent low back surgery and was in rehabilitation discharge last week. Patient is feeling increasingly short of breath on walking over the last 3 days. Recently the d-dimer was elevated and CT angiogram of the chest done in the ER was negative for PE but did show emphysematous changes. In addition to also showed right renal mass and pulmonary nodule. On exam patient is not in respiratory distress but patient states she gets short of breath and walking. Patient is also requiring oxygen to maintain her sats more than 90%. Presently on 2 L nasal cannula. Patient denies any chest pain productive cough fever chills nausea vomiting diarrhea. Patient's renal function also has deteriorated. Patient did receive 1 L normal saline bolus in the ER.   Review of Systems: As presented in the history of presenting illness, rest negative.  Past Medical History  Diagnosis Date  . Hypertension   . Bell's palsy   . Glaucoma   . Schizo-affective psychosis ([place])   . Memory disorder 09/05/2014  . Uterine fibroid   . Hypercalcemia   . Hyperlipidemia   . Back pain   . Chronic low back pain 05/09/2015  . Bronchitis   . Bipolar disorder ([place])   . Frequency of urination   . Cyst of right kidney   . GERD (gastroesophageal reflux disease)   . Arthritis   . Diabetes mellitus without complication ([place])     type 2   Past Surgical History  Procedure Laterality Date  . Abdominal hysterectomy    . Tonsillectomy and adenoidectomy    .  Appendectomy    . Colonoscopy w/ polypectomy    . Lumbar laminectomy/decompression microdiscectomy N/A 08/14/2015    Procedure: LUMBAR DECOMPRESSION MICRODISCECTOMY L3-S1;  Surgeon: Melina Schools, MD;  Location: [facility];  Service: Orthopedics;  Laterality: N/A;   Social History:  reports that she has been smoking Cigarettes.  She has a 14 pack-year smoking history. She has never used smokeless tobacco. She reports that she does not drink alcohol or use illicit drugs. Where does patient live at home. Can patient participate in ADLs? Yes.  Allergies  Allergen Reactions  . Codeine Nausea And Vomiting  . Prednisone Other (See Comments)    Has to monitor because she is a diabetic     Family History:  Family History  Problem Relation Age of Onset  . Dementia Mother   . Alzheimer's disease Mother   . Heart disease Mother   . Hypertension Mother   . Diabetes Mother   . Diabetes Father   . Alzheimer's disease Sister   . Heart disease Brother   . Heart attack Brother       Prior to Admission medications   Medication Sig Start Date End Date Taking? Authorizing Provider  albuterol (PROVENTIL HFA;VENTOLIN HFA) 108 (90 BASE) MCG/ACT inhaler Inhale 2 puffs into the lungs every 6 (six) hours as needed for wheezing or shortness of breath. 08/27/14  Yes Kerrie Buffalo, NP  albuterol (PROVENTIL) (2.5 MG/3ML) 0.083% nebulizer solution Take 2.5 mg by nebulization every 6 (six) hours as needed for wheezing  or shortness of breath.   Yes Historical Provider, MD  ARIPiprazole (ABILIFY) 20 MG tablet Take 1 tablet (20 mg total) by mouth at bedtime. 08/27/14  Yes Kerrie Buffalo, NP  budesonide-formoterol [facility]) 160-4.5 MCG/ACT inhaler Inhale 2 puffs into the lungs 2 (two) times daily.   Yes Historical Provider, MD  Cholecalciferol (VITAMIN D3 PO) Take 1 tablet by mouth daily.   Yes Historical Provider, MD  donepezil (ARICEPT) 5 MG tablet Take 1 tablet (5 mg total) by mouth at bedtime. 08/27/14  Yes Kerrie Buffalo, NP  glimepiride (AMARYL) 4 MG tablet Take 1 tablet (4 mg total) by mouth daily with breakfast. 08/27/14  Yes Kerrie Buffalo, NP  hydrOXYzine (ATARAX/VISTARIL) 50 MG tablet Take 50 mg by mouth at bedtime.   Yes Historical Provider, MD  ibuprofen (ADVIL,MOTRIN) 200 MG tablet Take 200 mg by mouth every 6 (six) hours as needed for headache, mild pain or moderate pain.   Yes Historical Provider, MD  Ipratropium-Albuterol (COMBIVENT) 20-100 MCG/ACT AERS respimat Inhale 1 puff into the lungs every 6 (six) hours.   Yes Historical Provider, MD  lamoTRIgine (LAMICTAL) 100 MG tablet Take 1 tablet (100 mg total) by mouth every evening. 08/27/14  Yes Kerrie Buffalo, NP  latanoprost (XALATAN) 0.005 % ophthalmic solution Place 1 drop into both eyes at bedtime. 08/27/14  Yes Kerrie Buffalo, NP  lisinopril (PRINIVIL,ZESTRIL) 40 MG tablet Take 1 tablet (40 mg total) by mouth daily. 08/27/14  Yes Kerrie Buffalo, NP  metFORMIN (GLUCOPHAGE) 1000 MG tablet Take 1 tablet (1,000 mg total) by mouth 2 (two) times daily with a meal. 08/27/14  Yes Kerrie Buffalo, NP  methocarbamol (ROBAXIN) 500 MG tablet Take 1 tablet (500 mg total) by mouth 3 (three) times daily as needed for muscle spasms. 08/14/15  Yes Melina Schools, MD  mirtazapine (REMERON) 7.5 MG tablet Take 1 tablet (7.5 mg total) by mouth at bedtime. 08/27/14  Yes Kerrie Buffalo, NP  ondansetron (ZOFRAN) 4 MG tablet Take 1 tablet (4 mg total) by mouth every 8 (eight) hours as needed for nausea or vomiting. 08/14/15  Yes Melina Schools, MD  oxyCODONE-acetaminophen (ROXICET) 5-325 MG tablet Take 1 tablet by mouth every 6 (six) hours as needed for severe pain. DO NOT EXCEED 4GM OF APAP IN 24 HOURS FROM ALL SOURCES 10/10/15  Yes Lauree Chandler, NP  Selenium Sulf-Pyrithione-Urea 2.25 % SHAM Apply 1 application topically once a week. Applied to scalp for dermatitis 01/25/15  Yes Historical Provider, MD  simvastatin (ZOCOR) 40 MG tablet Take 1 tablet (40 mg total) by mouth  daily. Patient taking differently: Take 40 mg by mouth every evening.  08/27/14  Yes Kerrie Buffalo, NP  oxyCODONE-acetaminophen (PERCOCET) 10-325 MG tablet 1 by mouth every 6 hours as needed for pain DO NOT EXCEED 4 GM APAP IN 24 HOURS Patient not taking: Reported on 11/06/2015 10/10/15   Gayland Curry, DO    Physical Exam: Filed Vitals:   11/06/15 1900 11/06/15 1930 11/06/15 2000 11/06/15 2100  BP: 132/65 128/60 131/52 155/79  Pulse: 96 88 93 88  Temp:    97.3 F (36.3 C)  TempSrc:    Oral  Resp: _0 Height:      Weight:      SpO2: 99% 97% 89% 97%     General:  Moderately built and nourished.  Eyes: Anicteric no pallor.  ENT: No discharge from the ears eyes nose and mouth.  Neck: No JVD appreciated.  Cardiovascular: S1-S2 heard.  Respiratory:  No rhonchi or crepitations.  Abdomen: Soft nontender bowel sounds present.  Skin: No rash.  Musculoskeletal: No edema.  Psychiatric: Appears normal.  Neurologic: Alert awake oriented to time place and person. Moves all extremities.  Labs on Admission:  Basic Metabolic Panel:  Recent Labs Lab 11/06/15 1600  NA 142  K 5.1  CL 109  CO2 24  GLUCOSE 85  BUN 21*  CREATININE 1.69*  CALCIUM 10.4*   Liver Function Tests: No results for input(s): AST, ALT, ALKPHOS, BILITOT, PROT, ALBUMIN in the last 168 hours. No results for input(s): LIPASE, AMYLASE in the last 168 hours. No results for input(s): AMMONIA in the last 168 hours. CBC:  Recent Labs Lab 11/06/15 1600  WBC 7.3  HGB 11.7*  HCT 36.9  MCV 83.5  PLT 395   Cardiac Enzymes: No results for input(s): CKTOTAL, CKMB, CKMBINDEX, TROPONINI in the last 168 hours.  BNP (last 3 results) No results for input(s): BNP in the last 8760 hours.  ProBNP (last 3 results) No results for input(s): PROBNP in the last 8760 hours.  CBG:  Recent Labs Lab 11/06/15 2136 11/06/15 2322  GLUCAP 58* 124*    Radiological Exams on Admission: Dg Chest 2  View  11/06/2015  CLINICAL DATA:  Shortness of breath. EXAM: CHEST  2 VIEW COMPARISON:  November 26, 2009. FINDINGS: The heart size and mediastinal contours are within normal limits. Both lungs are clear. No pneumothorax or pleural effusion is noted. The visualized skeletal structures are unremarkable. IMPRESSION: No active cardiopulmonary disease. Electronically Signed   By: Marijo Conception, M.D.   On: 11/06/2015 17:52   Ct Angio Chest Pe W/cm &/or Wo Cm  11/06/2015  CLINICAL DATA:  Shortness of Breath EXAM: CT ANGIOGRAPHY CHEST WITH CONTRAST TECHNIQUE: Multidetector CT imaging of the chest was performed using the standard protocol during bolus administration of intravenous contrast. Multiplanar CT image reconstructions and MIPs were obtained to evaluate the vascular anatomy. CONTRAST:  15m OMNIPAQUE IOHEXOL 350 MG/ML SOLN COMPARISON:  Chest radiograph November 06, 2015 FINDINGS: There is no demonstrable pulmonary embolus. There are foci of atherosclerotic calcification in the aorta. There is no thoracic aortic aneurysm or dissection. The visualized great vessels appear unremarkable. There is a degree of centrilobular emphysematous change. There is no parenchymal lung edema or consolidation. On axial slice 23 series 6982 there is a 4 mm nodular opacity in the periphery of the anterior segment of the right upper lobe. Visualized thyroid is unremarkable. There are prominent lymph nodes in the right axillary region, incompletely visualized. Largest completely visualized right axillary lymph node measures 2.6 x 1.6 cm. There are prominent lymph nodes scattered throughout the mediastinum. There is a right pretracheal lymph node measuring 1.6 x 1.6 cm. There is a lymph node in the lateral left hilar region measuring 1.6 x 1.4 cm. A second nearby lymph node measures 1.8 x 1.3 cm the left hilum. There is a lymph node in the sub- carinal region measuring 2.0 x 1.7 cm. Multiple smaller mediastinal lymph nodes are  noted. Pericardium is not thickened. In the visualized upper abdomen, there is cholelithiasis. There are cysts arising from the right kidney, largest measuring 2.8 x 2.2 cm. There is a mass arising from the medial upper pole of the left kidney measuring 1.8 x 1.4 cm which has attenuation values higher than is expected with a cyst. There is bilateral adrenal hypertrophy. There are no blastic or lytic bone lesions. There is degenerative change in the thoracic spine. Review of  the MIP images confirms the above findings. IMPRESSION: No demonstrable pulmonary embolus. Adenopathy of uncertain etiology. Underlying emphysema. No edema or consolidation. 4 mm nodular opacity anterior segment right upper lobe. Followup of this nodular opacity should be based on Fleischner Society guidelines. Given risk factors for bronchogenic carcinoma, follow-up chest CT at 1 year is recommended. This recommendation follows the consensus statement: Guidelines for Management of Small Pulmonary Nodules Detected on CT Scans: A Statement from the [place] as published in Radiology 2005; 237:395-400. Mass upper pole left kidney which has attenuation values higher than simple cyst. Neoplasm involving the upper pole left kidney cannot be excluded. This lesion measures 1.8 x 1.4 cm. This finding may warrant pre and post contrast renal MR or CT to further assess. MR would be the optimal imaging study of choice to further assess this lesion. Cholelithiasis. Electronically Signed   By: Lowella Grip III M.D.   On: 11/06/2015 19:23    EKG: Independently reviewed. Normal sinus rhythm.  Assessment/Plan Principal Problem:   Acute respiratory failure with hypoxia (HCC) Active Problems:   Hypercalcemia   Hypoxemia   Pulmonary nodule   AKI (acute kidney injury) ([place])   Emphysema of lung (HCC)   Diabetes mellitus type 2, controlled, without complications ([place])   HLD (hyperlipidemia)   Acute respiratory failure ([place])   1. Acute  respiratory failure with hypoxia - cause not clear. Patient's symptoms are mostly exertional. CT angiogram was negative for PE. I have ordered a 2-D echo to check for LV and RV function and also check for pulmonary hypertension. Patient on 2 L nasal cannula. 2. Acute renal failure - cause not clear. At this time I'm holding off patient's lisinopril discontinued patient's NSAIDs. Patient has received 1 L normal saline bolus. I am placing patient on gentle hydration for now. Closely follow metabolic panel and intake output. 3. Pulmonary nodule - history of severe smoking and hypercalcemia will need close follow-up with pulmonologist. 4. Right renal mass - will need MRI to further study. 5. Diabetes mellitus type 2 - patient on glimepiride and since patient has renal failure closely follow CBGs. I have placed patient on sliding scale coverage. 6. Chronic hypercalcemia - see #3. As per the outpatient notes patient's PTH was within acceptable limits. 7. COPD - presently not wheezing. Continue inhalers. 8. Recent low back surgery. 9. Anemia follow CBC.  I have reviewed patient's old charts are labs. Personally reviewed x-rays and EKG.   DVT Prophylaxis Lovenox.  Code Status: Full code.  Family Communication: Patient's family at the bedside.  Disposition Plan: Admit to inpatient.    Marcos Ruelas N. [facility] Pager [phone].  If 7PM-7AM, please contact night-coverage [email or web address] Password [facility] 11/06/2015, 11:26 PM

## 2015-11-06 NOTE — ED Notes (Signed)
Note from doctors office shows elevated d-dimer and reports pt has had difficulty maintaining o2 sats. In triage o2 ranging from 86%-94%. Pt denies sob at this time. Surgery on back in august.

## 2015-11-06 NOTE — ED Notes (Signed)
Placed pt on 2L [place] for room air sats 87%. Sats now 96% on 2L [place]

## 2015-11-07 ENCOUNTER — Inpatient Hospital Stay (HOSPITAL_COMMUNITY): Payer: Medicare Other

## 2015-11-07 DIAGNOSIS — J9601 Acute respiratory failure with hypoxia: Principal | ICD-10-CM

## 2015-11-07 DIAGNOSIS — M79606 Pain in leg, unspecified: Secondary | ICD-10-CM

## 2015-11-07 DIAGNOSIS — R0600 Dyspnea, unspecified: Secondary | ICD-10-CM

## 2015-11-07 LAB — COMPREHENSIVE METABOLIC PANEL
ALK PHOS: 59 U/L (ref 38–126)
ALT: 15 U/L (ref 14–54)
ANION GAP: 8 (ref 5–15)
AST: 20 U/L (ref 15–41)
Albumin: 3.6 g/dL (ref 3.5–5.0)
BILIRUBIN TOTAL: 0.5 mg/dL (ref 0.3–1.2)
BUN: 16 mg/dL (ref 6–20)
CALCIUM: 9.7 mg/dL (ref 8.9–10.3)
CO2: 24 mmol/L (ref 22–32)
Chloride: 109 mmol/L (ref 101–111)
Creatinine, Ser: 1.17 mg/dL — ABNORMAL HIGH (ref 0.44–1.00)
GFR, EST AFRICAN AMERICAN: 55 mL/min — AB (ref >60)
GFR, EST NON AFRICAN AMERICAN: 48 mL/min — AB (ref >60)
Glucose, Bld: 71 mg/dL (ref 65–99)
POTASSIUM: 4.7 mmol/L (ref 3.5–5.1)
Sodium: 141 mmol/L (ref 135–145)
TOTAL PROTEIN: 7.1 g/dL (ref 6.5–8.1)

## 2015-11-07 LAB — CBC WITH DIFFERENTIAL/PLATELET
BASOS ABS: 0 10*3/uL (ref 0.0–0.1)
BASOS PCT: 0 %
Eosinophils Absolute: 0.3 10*3/uL (ref 0.0–0.7)
Eosinophils Relative: 4 %
HEMATOCRIT: 34.6 % — AB (ref 36.0–46.0)
HEMOGLOBIN: 10.7 g/dL — AB (ref 12.0–15.0)
Lymphocytes Relative: 41 %
Lymphs Abs: 2.7 10*3/uL (ref 0.7–4.0)
MCH: 25.8 pg — ABNORMAL LOW (ref 26.0–34.0)
MCHC: 30.9 g/dL (ref 30.0–36.0)
MCV: 83.6 fL (ref 78.0–100.0)
MONO ABS: 0.5 10*3/uL (ref 0.1–1.0)
Monocytes Relative: 8 %
NEUTROS ABS: 3.1 10*3/uL (ref 1.7–7.7)
NEUTROS PCT: 47 %
Platelets: 371 10*3/uL (ref 150–400)
RBC: 4.14 MIL/uL (ref 3.87–5.11)
RDW: 15.1 % (ref 11.5–15.5)
WBC: 6.7 10*3/uL (ref 4.0–10.5)

## 2015-11-07 LAB — GLUCOSE, CAPILLARY
GLUCOSE-CAPILLARY: 126 mg/dL — AB (ref 65–99)
GLUCOSE-CAPILLARY: 135 mg/dL — AB (ref 65–99)
GLUCOSE-CAPILLARY: 144 mg/dL — AB (ref 65–99)
Glucose-Capillary: 120 mg/dL — ABNORMAL HIGH (ref 65–99)

## 2015-11-07 LAB — BRAIN NATRIURETIC PEPTIDE: B Natriuretic Peptide: 17.5 pg/mL (ref 0.0–100.0)

## 2015-11-07 LAB — TSH: TSH: 1.027 u[IU]/mL (ref 0.350–4.500)

## 2015-11-07 LAB — TROPONIN I

## 2015-11-07 MED ORDER — CETYLPYRIDINIUM CHLORIDE 0.05 % MT LIQD
7.0000 mL | Freq: Two times a day (BID) | OROMUCOSAL | Status: DC
  Administered 2015-11-07 – 2015-11-08 (×3): 7 mL via OROMUCOSAL

## 2015-11-07 NOTE — Progress Notes (Signed)
NURSING PROGRESS NOTE  Elizabeth Thompson MRN: [id number] Admission Data: 11/06/2015 2100 Attending Provider: Rise Patience, MD PCP: Imelda Pillow, NP Code status: Full  Allergies:  Allergies  Allergen Reactions  . Codeine Nausea And Vomiting  . Prednisone Other (See Comments)    Has to monitor because she is a diabetic     Past Medical History:  has a past medical history of Hypertension; Bell's palsy; Glaucoma; Schizo-affective psychosis ([place]); Memory disorder (09/05/2014); Uterine fibroid; Hypercalcemia; Hyperlipidemia; Back pain; Chronic low back pain (05/09/2015); Bronchitis; Bipolar disorder ([place]); Frequency of urination; Cyst of right kidney; GERD (gastroesophageal reflux disease); Arthritis; and Diabetes mellitus without complication ([place]).  Past Surgical History:  Past Medical History  Diagnosis Date  . Hypertension   . Bell's palsy   . Glaucoma   . Schizo-affective psychosis ([place])   . Memory disorder 09/05/2014  . Uterine fibroid   . Hypercalcemia   . Hyperlipidemia   . Back pain   . Chronic low back pain 05/09/2015  . Bronchitis   . Bipolar disorder ([place])   . Frequency of urination   . Cyst of right kidney   . GERD (gastroesophageal reflux disease)   . Arthritis   . Diabetes mellitus without complication ([place])     type 2    Elizabeth Thompson is a 64 y.o. female patient, arrived to floor in room [phone] via bed, transferred from ED. Patient alert and oriented X 4. No acute distress noted. Complains of 4/10 pain. Resting comfortably in bed with relatives at bedside.  Vital signs: Oral temperature 97.3 F (36.3 C), Blood pressure 155/79, Pulse 88, RR 18, SpO2 97 % on 4L of oxygen. Height 5'3", weight 190 lbs (86.183 kg).   Cardiac monitoring: Telemetry box 5W # 11 in place.  IV access: Left AC; condition patent and no redness; site is clean, dry, and intact  Skin: clean, dry, and intact. No pressure ulcers or skin breakdown noted in sacral area.    Patient's ID armband verified with patient/ family, and in place. Information packet given to patient/ family. Fall risk assessed, SR up X2, patient/ family able to verbalize understanding of risks associated with falls and to call nurse or staff to assist before getting out of bed. Patient/ family oriented to room and equipment. Call bell within reach.        Randon Goldsmith, RN

## 2015-11-07 NOTE — Progress Notes (Signed)
Hypoglycemic Event  CBG: 58  Treatment: 15 GM carbohydrate snack  Symptoms: None  Follow-up CBG: Time:2322 CBG Result:124  Possible Reasons for Event: Inadequate meal intake  Comments/MD notified: Tylene Fantasia notified  Randon Goldsmith, RN

## 2015-11-07 NOTE — Progress Notes (Signed)
[facility] PROGRESS NOTE  Elizabeth Thompson UEA:[id number] DOB: February 15, 1951 DOA: 11/06/2015 PCP: Imelda Pillow, NP  Length of stay: 1   Assessment/Plan: Principal Problem:   Acute respiratory failure with hypoxia ([place]) Active Problems:   Hypercalcemia   Hypoxemia   Pulmonary nodule   AKI (acute kidney injury) ([place])   Emphysema of lung ([place])   Diabetes mellitus type 2, controlled, without complications ([place])   HLD (hyperlipidemia)   Acute respiratory failure ([place])     1. Acute respiratory failure with hypoxia - secondary to COPD exacerbation. Patient's symptoms are mostly exertional. CT angiogram was negative for PE. Follow results of 2-D echo to check for LV and RV function and also check for pulmonary hypertension. Wean  oxygen 2. Acute renal failure -improved. Creatinine 1.69> 1.17.[name] At this time I'm holding off patient's lisinopril discontinued patient's NSAIDs. Received normal saline bolus. I am placing patient on gentle hydration for now. Closely follow metabolic panel and intake output. 3. Pulmonary nodule - history of severe smoking and hypercalcemia will need close follow-up with pulmonologist. Patient also has right axillary lymph nodes that needs to be biopsied in addition to multiple other lymph nodes pretracheal, left hilar, lymph nodes that were discussed with the patient and her daughter. Most likely she can have a biopsy of her right axillary lymph node in the outpatient setting. No history of sarcoidosis. Patient states that she recently had a negative mammogram 4. Right renal mass - will need MRI to further study. Daughter is already aware of the left renal cyst 5. Diabetes mellitus type 2 - patient on glimepiride and since patient has renal failure closely follow CBGs. I have placed patient on sliding scale coverage. 6. Chronic hypercalcemia - see #3. As per the outpatient notes patient's PTH was within acceptable limits. 7. COPD - presently not  wheezing. Continue inhalers. 8. Recent low back surgery. 9. Anemia follow CBC. 10. Abnormal d-dimer-rule out DVT  DVT prophylaxsis Lovenox  Code Status:      Code Status Orders        Start     Ordered   11/06/15 2325  Full code   Continuous     11/06/15 2325     Family Communication: family updated about patient's clinical progress Disposition Plan:  Anticipate discharge tomorrow   Brief narrative: 64 y.o. female with history of hypertension, diabetes mellitus type 2, hypercalcemia, schizoaffective disorder and memory disorder was brought to the ER the patient was found to have increasingly short of breath. Patient has recent low back surgery and was in rehabilitation discharge last week. Patient is feeling increasingly short of breath on walking over the last 3 days. Recently the d-dimer was elevated and CT angiogram of the chest done in the ER was negative for PE but did show emphysematous changes. In addition to also showed right renal mass and pulmonary nodule. On exam patient is not in respiratory distress but patient states she gets short of breath and walking. Patient is also requiring oxygen to maintain her sats more than 90%. Presently on 2 L nasal cannula. Patient denies any chest pain productive cough fever chills nausea vomiting diarrhea. Patient's renal function also has deteriorated. Patient did receive 1 L normal saline bolus in the ER.   Consultants:  None  Procedures:  None  Antibiotics: Anti-infectives    None         HPI/Subjective: Patient breathing comfortably, denies any cough congestion or sore throat  Objective: Filed  Vitals:   11/06/15 2000 11/06/15 2100 11/07/15 0543 11/07/15 0909  BP: 131/52 155/79 138/63   Pulse: 93 88 78   Temp:  97.3 F (36.3 C) 98 F (36.7 C)   TempSrc:  Oral Oral   Resp: _0 Height:   _1  (1.6 m)   Weight:   90.8 kg (200 lb 2.8 oz)   SpO2: 89% 97% 97% 100%    Intake/Output Summary (Last 24 hours)  at 11/07/15 1100 Last data filed at 11/07/15 0500  Gross per 24 hour  Intake      0 ml  Output    620 ml  Net   -620 ml    Exam:  General: No acute respiratory distress Lungs: Clear to auscultation bilaterally without wheezes or crackles Cardiovascular: Regular rate and rhythm without murmur gallop or rub normal S1 and S2 Abdomen: Nontender, nondistended, soft, bowel sounds positive, no rebound, no ascites, no appreciable mass Extremities: No significant cyanosis, clubbing, or edema bilateral lower extremities     Data Review   Micro Results No results found for this or any previous visit (from the past 240 hour(s)).  Radiology Reports Dg Chest 2 View  11/06/2015  CLINICAL DATA:  Shortness of breath. EXAM: CHEST  2 VIEW COMPARISON:  November 26, 2009. FINDINGS: The heart size and mediastinal contours are within normal limits. Both lungs are clear. No pneumothorax or pleural effusion is noted. The visualized skeletal structures are unremarkable. IMPRESSION: No active cardiopulmonary disease. Electronically Signed   By: Marijo Conception, M.D.   On: 11/06/2015 17:52   Ct Angio Chest Pe W/cm &/or Wo Cm  11/06/2015  CLINICAL DATA:  Shortness of Breath EXAM: CT ANGIOGRAPHY CHEST WITH CONTRAST TECHNIQUE: Multidetector CT imaging of the chest was performed using the standard protocol during bolus administration of intravenous contrast. Multiplanar CT image reconstructions and MIPs were obtained to evaluate the vascular anatomy. CONTRAST:  28m OMNIPAQUE IOHEXOL 350 MG/ML SOLN COMPARISON:  Chest radiograph November 06, 2015 FINDINGS: There is no demonstrable pulmonary embolus. There are foci of atherosclerotic calcification in the aorta. There is no thoracic aortic aneurysm or dissection. The visualized great vessels appear unremarkable. There is a degree of centrilobular emphysematous change. There is no parenchymal lung edema or consolidation. On axial slice 23 series 6016 there is a 4 mm  nodular opacity in the periphery of the anterior segment of the right upper lobe. Visualized thyroid is unremarkable. There are prominent lymph nodes in the right axillary region, incompletely visualized. Largest completely visualized right axillary lymph node measures 2.6 x 1.6 cm. There are prominent lymph nodes scattered throughout the mediastinum. There is a right pretracheal lymph node measuring 1.6 x 1.6 cm. There is a lymph node in the lateral left hilar region measuring 1.6 x 1.4 cm. A second nearby lymph node measures 1.8 x 1.3 cm the left hilum. There is a lymph node in the sub- carinal region measuring 2.0 x 1.7 cm. Multiple smaller mediastinal lymph nodes are noted. Pericardium is not thickened. In the visualized upper abdomen, there is cholelithiasis. There are cysts arising from the right kidney, largest measuring 2.8 x 2.2 cm. There is a mass arising from the medial upper pole of the left kidney measuring 1.8 x 1.4 cm which has attenuation values higher than is expected with a cyst. There is bilateral adrenal hypertrophy. There are no blastic or lytic bone lesions. There is degenerative change in the thoracic spine. Review of the MIP images confirms  the above findings. IMPRESSION: No demonstrable pulmonary embolus. Adenopathy of uncertain etiology. Underlying emphysema. No edema or consolidation. 4 mm nodular opacity anterior segment right upper lobe. Followup of this nodular opacity should be based on Fleischner Society guidelines. Given risk factors for bronchogenic carcinoma, follow-up chest CT at 1 year is recommended. This recommendation follows the consensus statement: Guidelines for Management of Small Pulmonary Nodules Detected on CT Scans: A Statement from the [employer] as published in Radiology 2005; 237:395-400. Mass upper pole left kidney which has attenuation values higher than simple cyst. Neoplasm involving the upper pole left kidney cannot be excluded. This lesion measures 1.8  x 1.4 cm. This finding may warrant pre and post contrast renal MR or CT to further assess. MR would be the optimal imaging study of choice to further assess this lesion. Cholelithiasis. Electronically Signed   By: Lowella Grip III M.D.   On: 11/06/2015 19:23     CBC  Recent Labs Lab 11/06/15 1600 11/07/15 0230  WBC 7.3 6.7  HGB 11.7* 10.7*  HCT 36.9 34.6*  PLT 395 371  MCV 83.5 83.6  MCH 26.5 25.8*  MCHC 31.7 30.9  RDW 14.9 15.1  LYMPHSABS  --  2.7  MONOABS  --  0.5  EOSABS  --  0.3  BASOSABS  --  0.0    Chemistries   Recent Labs Lab 11/06/15 1600 11/07/15 0230  NA 142 141  K 5.1 4.7  CL 109 109  CO2 24 24  GLUCOSE 85 71  BUN 21* 16  CREATININE 1.69* 1.17*  CALCIUM 10.4* 9.7  AST  --  20  ALT  --  15  ALKPHOS  --  59  BILITOT  --  0.5   ------------------------------------------------------------------------------------------------------------------ estimated creatinine clearance is 51.3 mL/min (by C-G formula based on Cr of 1.17). ------------------------------------------------------------------------------------------------------------------ No results for input(s): HGBA1C in the last 72 hours. ------------------------------------------------------------------------------------------------------------------ No results for input(s): CHOL, HDL, LDLCALC, TRIG, CHOLHDL, LDLDIRECT in the last 72 hours. ------------------------------------------------------------------------------------------------------------------  Recent Labs  11/07/15 0019  TSH 1.027   ------------------------------------------------------------------------------------------------------------------ No results for input(s): VITAMINB12, FOLATE, FERRITIN, TIBC, IRON, RETICCTPCT in the last 72 hours.  Coagulation profile No results for input(s): INR, PROTIME in the last 168 hours.  No results for input(s): DDIMER in the last 72 hours.  Cardiac Enzymes  Recent Labs Lab 11/07/15 0019   TROPONINI <0.03   ------------------------------------------------------------------------------------------------------------------ Invalid input(s): POCBNP   CBG:  Recent Labs Lab 11/06/15 2136 11/06/15 2322 11/07/15 0744  GLUCAP 58* 124* 120*       Studies: Dg Chest 2 View  11/06/2015  CLINICAL DATA:  Shortness of breath. EXAM: CHEST  2 VIEW COMPARISON:  November 26, 2009. FINDINGS: The heart size and mediastinal contours are within normal limits. Both lungs are clear. No pneumothorax or pleural effusion is noted. The visualized skeletal structures are unremarkable. IMPRESSION: No active cardiopulmonary disease. Electronically Signed   By: Marijo Conception, M.D.   On: 11/06/2015 17:52   Ct Angio Chest Pe W/cm &/or Wo Cm  11/06/2015  CLINICAL DATA:  Shortness of Breath EXAM: CT ANGIOGRAPHY CHEST WITH CONTRAST TECHNIQUE: Multidetector CT imaging of the chest was performed using the standard protocol during bolus administration of intravenous contrast. Multiplanar CT image reconstructions and MIPs were obtained to evaluate the vascular anatomy. CONTRAST:  28m OMNIPAQUE IOHEXOL 350 MG/ML SOLN COMPARISON:  Chest radiograph November 06, 2015 FINDINGS: There is no demonstrable pulmonary embolus. There are foci of atherosclerotic calcification in the aorta. There is no thoracic  aortic aneurysm or dissection. The visualized great vessels appear unremarkable. There is a degree of centrilobular emphysematous change. There is no parenchymal lung edema or consolidation. On axial slice 23 series 557, there is a 4 mm nodular opacity in the periphery of the anterior segment of the right upper lobe. Visualized thyroid is unremarkable. There are prominent lymph nodes in the right axillary region, incompletely visualized. Largest completely visualized right axillary lymph node measures 2.6 x 1.6 cm. There are prominent lymph nodes scattered throughout the mediastinum. There is a right pretracheal lymph  node measuring 1.6 x 1.6 cm. There is a lymph node in the lateral left hilar region measuring 1.6 x 1.4 cm. A second nearby lymph node measures 1.8 x 1.3 cm the left hilum. There is a lymph node in the sub- carinal region measuring 2.0 x 1.7 cm. Multiple smaller mediastinal lymph nodes are noted. Pericardium is not thickened. In the visualized upper abdomen, there is cholelithiasis. There are cysts arising from the right kidney, largest measuring 2.8 x 2.2 cm. There is a mass arising from the medial upper pole of the left kidney measuring 1.8 x 1.4 cm which has attenuation values higher than is expected with a cyst. There is bilateral adrenal hypertrophy. There are no blastic or lytic bone lesions. There is degenerative change in the thoracic spine. Review of the MIP images confirms the above findings. IMPRESSION: No demonstrable pulmonary embolus. Adenopathy of uncertain etiology. Underlying emphysema. No edema or consolidation. 4 mm nodular opacity anterior segment right upper lobe. Followup of this nodular opacity should be based on Fleischner Society guidelines. Given risk factors for bronchogenic carcinoma, follow-up chest CT at 1 year is recommended. This recommendation follows the consensus statement: Guidelines for Management of Small Pulmonary Nodules Detected on CT Scans: A Statement from the [place] as published in Radiology 2005; 237:395-400. Mass upper pole left kidney which has attenuation values higher than simple cyst. Neoplasm involving the upper pole left kidney cannot be excluded. This lesion measures 1.8 x 1.4 cm. This finding may warrant pre and post contrast renal MR or CT to further assess. MR would be the optimal imaging study of choice to further assess this lesion. Cholelithiasis. Electronically Signed   By: Lowella Grip III M.D.   On: 11/06/2015 19:23      Lab Results  Component Value Date   HGBA1C 6.6* 08/20/2015   HGBA1C 6.9* 08/14/2015   HGBA1C 7.0* 08/09/2015    Lab Results  Component Value Date   CREATININE 1.17* 11/07/2015       Scheduled Meds: . antiseptic oral rinse  7 mL Mouth Rinse BID  . ARIPiprazole  20 mg Oral QHS  . budesonide-formoterol  2 puff Inhalation BID  . donepezil  5 mg Oral QHS  . enoxaparin (LOVENOX) injection  40 mg Subcutaneous Q24H  . glimepiride  4 mg Oral Q breakfast  . insulin aspart  0-9 Units Subcutaneous TID WC  . ipratropium-albuterol  3 mL Inhalation Q6H  . lamoTRIgine  100 mg Oral QPM  . latanoprost  1 drop Both Eyes QHS  . mirtazapine  7.5 mg Oral QHS  . simvastatin  40 mg Oral QPM   Continuous Infusions:   Principal Problem:   Acute respiratory failure with hypoxia ([facility]) Active Problems:   Hypercalcemia   Hypoxemia   Pulmonary nodule   AKI (acute kidney injury) ([place])   Emphysema of lung ([facility])   Diabetes mellitus type 2, controlled, without complications ([place])   HLD (hyperlipidemia)  Acute respiratory failure ([place])    Time spent: 45 minutes   [facility] Pager [phone]. If 7PM-7AM, please contact night-coverage at [email or web address], password [facility] 11/07/2015, 11:00 AM  LOS: 1 day

## 2015-11-07 NOTE — Progress Notes (Signed)
VASCULAR LAB PRELIMINARY  PRELIMINARY  PRELIMINARY  PRELIMINARY  Bilateral lower extremity venous duplex completed.    Preliminary report:  There is no DVT or SVT noted in the bilateral lower extremities.   Elimelech Houseman, RVT 11/07/2015, 4:47 PM

## 2015-11-08 LAB — CBC
HEMATOCRIT: 34.2 % — AB (ref 36.0–46.0)
Hemoglobin: 10.6 g/dL — ABNORMAL LOW (ref 12.0–15.0)
MCH: 26 pg (ref 26.0–34.0)
MCHC: 31 g/dL (ref 30.0–36.0)
MCV: 84 fL (ref 78.0–100.0)
Platelets: 342 10*3/uL (ref 150–400)
RBC: 4.07 MIL/uL (ref 3.87–5.11)
RDW: 15 % (ref 11.5–15.5)
WBC: 6 10*3/uL (ref 4.0–10.5)

## 2015-11-08 LAB — GLUCOSE, CAPILLARY
GLUCOSE-CAPILLARY: 142 mg/dL — AB (ref 65–99)
Glucose-Capillary: 97 mg/dL (ref 65–99)

## 2015-11-08 LAB — COMPREHENSIVE METABOLIC PANEL
ALBUMIN: 3.7 g/dL (ref 3.5–5.0)
ALT: 13 U/L — ABNORMAL LOW (ref 14–54)
AST: 18 U/L (ref 15–41)
Alkaline Phosphatase: 55 U/L (ref 38–126)
Anion gap: 8 (ref 5–15)
BUN: 10 mg/dL (ref 6–20)
CHLORIDE: 109 mmol/L (ref 101–111)
CO2: 26 mmol/L (ref 22–32)
Calcium: 9.9 mg/dL (ref 8.9–10.3)
Creatinine, Ser: 1.01 mg/dL — ABNORMAL HIGH (ref 0.44–1.00)
GFR calc Af Amer: >60 mL/min (ref >60)
GFR calc non Af Amer: 57 mL/min — ABNORMAL LOW (ref >60)
GLUCOSE: 109 mg/dL — AB (ref 65–99)
POTASSIUM: 4.5 mmol/L (ref 3.5–5.1)
Sodium: 143 mmol/L (ref 135–145)
Total Bilirubin: 0.6 mg/dL (ref 0.3–1.2)
Total Protein: 6.9 g/dL (ref 6.5–8.1)

## 2015-11-08 MED ORDER — AMOXICILLIN-POT CLAVULANATE 875-125 MG PO TABS
1.0000 | ORAL_TABLET | Freq: Two times a day (BID) | ORAL | Status: DC
  Administered 2015-11-08: 1 via ORAL
  Filled 2015-11-08: qty 1

## 2015-11-08 MED ORDER — AMOXICILLIN-POT CLAVULANATE 875-125 MG PO TABS: 1.0000 | ORAL_TABLET | Freq: Two times a day (BID) | ORAL | Status: DC

## 2015-11-08 MED ORDER — LISINOPRIL 40 MG PO TABS: 40.0000 mg | ORAL_TABLET | Freq: Every day | ORAL | Status: DC

## 2015-11-08 NOTE — Progress Notes (Signed)
OT Cancellation Note  Patient Details Name: MAYLEY LISH MRN: [id number] DOB: 06-29-1951   Cancelled Treatment:    Reason Eval/Treat Not Completed: OT screened, no needs identified, will sign off  Darlina Rumpf [place], OTR/L [phone]  11/08/2015, 2:34 PM

## 2015-11-08 NOTE — Care Management Note (Signed)
Case Management Note  Patient Details  Name: Elizabeth Thompson MRN: [id number] Date of Birth: 08/05/1951  Subjective/Objective:     Patient is for dc today, she is active with Arville Go with [facility], PT, and Speech, she would like to resume these services, Stanton Kidney with [facility] notified.                 Action/Plan:   Expected Discharge Date:                  Expected Discharge Plan:  [place]  In-House Referral:     Discharge planning Services  CM Consult  Post Acute Care Choice:  Resumption of Svcs/PTA Provider Choice offered to:  Patient  DME Arranged:    DME Agency:     HH Arranged:  RN, PT, Speech Therapy HH Agency:  [facility]  Status of Service:     Medicare Important Message Given:    Date Medicare IM Given:    Medicare IM give by:    Date Additional Medicare IM Given:    Additional Medicare Important Message give by:     If discussed at [place] of Stay Meetings, dates discussed:    Additional Comments:  Zenon Mayo, RN 11/08/2015, 3:34 PM

## 2015-11-08 NOTE — Discharge Summary (Signed)
Physician Discharge Summary  Elizabeth Thompson MRN: [id number] DOB/AGE: October 11, 1951 64 y.o.  PCP: Imelda Pillow, NP   Admit date: 11/06/2015 Discharge date: 11/08/2015  Discharge Diagnoses:     Principal Problem:   Acute respiratory failure with hypoxia ([place]) Active Problems:   Hypercalcemia   Hypoxemia   Pulmonary nodule   AKI (acute kidney injury) ([place])   Emphysema of lung (H[facility])   Diabetes mellitus type 2, controlled, without complications ([place])   HLD (hyperlipidemia) Acute Copd exacerbation    Follow-up recommendations Follow-up with PCP in 3-5 days , including all  additional recommended appointments as below Follow-up CBC, CMP in 3-5 days Patient needs to have a biopsy of her right axillary lymph node Left renal cyst 1.8 x 1.4 cm. Needs pre and post contrast renal MR   further assess    Medication List    STOP taking these medications        ibuprofen 200 MG tablet  Commonly known as:  ADVIL,MOTRIN      TAKE these medications        albuterol (2.5 MG/3ML) 0.083% nebulizer solution  Commonly known as:  PROVENTIL  Take 2.5 mg by nebulization every 6 (six) hours as needed for wheezing or shortness of breath.     albuterol 108 (90 BASE) MCG/ACT inhaler  Commonly known as:  PROVENTIL HFA;VENTOLIN HFA  Inhale 2 puffs into the lungs every 6 (six) hours as needed for wheezing or shortness of breath.     amoxicillin-clavulanate 875-125 MG tablet  Commonly known as:  AUGMENTIN  Take 1 tablet by mouth every 12 (twelve) hours.     ARIPiprazole 20 MG tablet  Commonly known as:  ABILIFY  Take 1 tablet (20 mg total) by mouth at bedtime.     budesonide-formoterol 160-4.5 MCG/ACT inhaler  Commonly known as:  SYMBICORT  Inhale 2 puffs into the lungs 2 (two) times daily.     donepezil 5 MG tablet  Commonly known as:  ARICEPT  Take 1 tablet (5 mg total) by mouth at bedtime.     glimepiride 4 MG tablet  Commonly known as:  AMARYL  Take 1 tablet (4 mg total)  by mouth daily with breakfast.     hydrOXYzine 50 MG tablet  Commonly known as:  ATARAX/VISTARIL  Take 50 mg by mouth at bedtime.     Ipratropium-Albuterol 20-100 MCG/ACT Aers respimat  Commonly known as:  COMBIVENT  Inhale 1 puff into the lungs every 6 (six) hours.     lamoTRIgine 100 MG tablet  Commonly known as:  LAMICTAL  Take 1 tablet (100 mg total) by mouth every evening.     latanoprost 0.005 % ophthalmic solution  Commonly known as:  XALATAN  Place 1 drop into both eyes at bedtime.     lisinopril 40 MG tablet  Commonly known as:  PRINIVIL,ZESTRIL  Take 1 tablet (40 mg total) by mouth daily.     metFORMIN 1000 MG tablet  Commonly known as:  GLUCOPHAGE  Take 1 tablet (1,000 mg total) by mouth 2 (two) times daily with a meal.     methocarbamol 500 MG tablet  Commonly known as:  ROBAXIN  Take 1 tablet (500 mg total) by mouth 3 (three) times daily as needed for muscle spasms.     mirtazapine 7.5 MG tablet  Commonly known as:  REMERON  Take 1 tablet (7.5 mg total) by mouth at bedtime.     ondansetron 4 MG tablet  Commonly known as:  ZOFRAN  Take 1 tablet (4 mg total) by mouth every 8 (eight) hours as needed for nausea or vomiting.     oxyCODONE-acetaminophen 5-325 MG tablet  Commonly known as:  ROXICET  Take 1 tablet by mouth every 6 (six) hours as needed for severe pain. DO NOT EXCEED 4GM OF APAP IN 24 HOURS FROM ALL SOURCES     Selenium Sulf-Pyrithione-Urea 2.25 % Sham  Apply 1 application topically once a week. Applied to scalp for dermatitis     simvastatin 40 MG tablet  Commonly known as:  ZOCOR  Take 1 tablet (40 mg total) by mouth daily.     VITAMIN D3 PO  Take 1 tablet by mouth daily.         Discharge Condition: stab;e   Discharge Instructions       Discharge Instructions    Diet - low sodium heart healthy    Complete by:  As directed      Increase activity slowly    Complete by:  As directed            Allergies  Allergen Reactions   . Codeine Nausea And Vomiting  . Prednisone Other (See Comments)    Has to monitor because she is a diabetic       Disposition: 03-Skilled Nursing Facility   Consults:  None     Significant Diagnostic Studies:  Dg Chest 2 View  11/06/2015  CLINICAL DATA:  Shortness of breath. EXAM: CHEST  2 VIEW COMPARISON:  November 26, 2009. FINDINGS: The heart size and mediastinal contours are within normal limits. Both lungs are clear. No pneumothorax or pleural effusion is noted. The visualized skeletal structures are unremarkable. IMPRESSION: No active cardiopulmonary disease. Electronically Signed   By: Marijo Conception, M.D.   On: 11/06/2015 17:52   Ct Angio Chest Pe W/cm &/or Wo Cm  11/06/2015  CLINICAL DATA:  Shortness of Breath EXAM: CT ANGIOGRAPHY CHEST WITH CONTRAST TECHNIQUE: Multidetector CT imaging of the chest was performed using the standard protocol during bolus administration of intravenous contrast. Multiplanar CT image reconstructions and MIPs were obtained to evaluate the vascular anatomy. CONTRAST:  37m OMNIPAQUE IOHEXOL 350 MG/ML SOLN COMPARISON:  Chest radiograph November 06, 2015 FINDINGS: There is no demonstrable pulmonary embolus. There are foci of atherosclerotic calcification in the aorta. There is no thoracic aortic aneurysm or dissection. The visualized great vessels appear unremarkable. There is a degree of centrilobular emphysematous change. There is no parenchymal lung edema or consolidation. On axial slice 23 series 6856 there is a 4 mm nodular opacity in the periphery of the anterior segment of the right upper lobe. Visualized thyroid is unremarkable. There are prominent lymph nodes in the right axillary region, incompletely visualized. Largest completely visualized right axillary lymph node measures 2.6 x 1.6 cm. There are prominent lymph nodes scattered throughout the mediastinum. There is a right pretracheal lymph node measuring 1.6 x 1.6 cm. There is a lymph node in  the lateral left hilar region measuring 1.6 x 1.4 cm. A second nearby lymph node measures 1.8 x 1.3 cm the left hilum. There is a lymph node in the sub- carinal region measuring 2.0 x 1.7 cm. Multiple smaller mediastinal lymph nodes are noted. Pericardium is not thickened. In the visualized upper abdomen, there is cholelithiasis. There are cysts arising from the right kidney, largest measuring 2.8 x 2.2 cm. There is a mass arising from the medial upper pole of the left kidney measuring 1.8 x 1.4 cm which  has attenuation values higher than is expected with a cyst. There is bilateral adrenal hypertrophy. There are no blastic or lytic bone lesions. There is degenerative change in the thoracic spine. Review of the MIP images confirms the above findings. IMPRESSION: No demonstrable pulmonary embolus. Adenopathy of uncertain etiology. Underlying emphysema. No edema or consolidation. 4 mm nodular opacity anterior segment right upper lobe. Followup of this nodular opacity should be based on Fleischner Society guidelines. Given risk factors for bronchogenic carcinoma, follow-up chest CT at 1 year is recommended. This recommendation follows the consensus statement: Guidelines for Management of Small Pulmonary Nodules Detected on CT Scans: A Statement from the [employer] as published in Radiology 2005; 237:395-400. Mass upper pole left kidney which has attenuation values higher than simple cyst. Neoplasm involving the upper pole left kidney cannot be excluded. This lesion measures 1.8 x 1.4 cm. This finding may warrant pre and post contrast renal MR or CT to further assess. MR would be the optimal imaging study of choice to further assess this lesion. Cholelithiasis. Electronically Signed   By: Lowella Grip III M.D.   On: 11/06/2015 19:23    echocardiogram  LV EF: 55% -  60%  ------------------------------------------------------------------- Indications:   Dyspnea  786.09.  ------------------------------------------------------------------- Study Conclusions  - Left ventricle: The cavity size was normal. Systolic function was normal. The estimated ejection fraction was in the range of 55% to 60%. Wall motion was normal; there were no regional wall motion abnormalities. There was an increased relative contribution of atrial contraction to ventricular filling. Doppler parameters are consistent with abnormal left ventricular relaxation (grade 1 diastolic dysfunction). - Aortic valve: Moderate diffuse thickening and calcification. There was mild stenosis. Valve area (VTI): 1.53 cm^2. Valve area (Vmax): 1.48 cm^2. Valve area (Vmean): 1.46 cm^2.     Filed Weights   11/06/15 1602 11/07/15 0543  Weight: 86.183 kg (190 lb) 90.8 kg (200 lb 2.8 oz)     Microbiology: No results found for this or any previous visit (from the past 240 hour(s)).     Blood Culture No results found for: SDES, [place], CULT, REPTSTATUS    Labs: Results for orders placed or performed during the hospital encounter of 11/06/15 (from the past 48 hour(s))  Basic metabolic panel     Status: Abnormal   Collection Time: 11/06/15  4:00 PM  Result Value Ref Range   Sodium 142 135 - 145 mmol/L   Potassium 5.1 3.5 - 5.1 mmol/L   Chloride 109 101 - 111 mmol/L   CO2 24 22 - 32 mmol/L   Glucose, Bld 85 65 - 99 mg/dL   BUN 21 (H) 6 - 20 mg/dL   Creatinine, Ser 1.69 (H) 0.44 - 1.00 mg/dL   Calcium 10.4 (H) 8.9 - 10.3 mg/dL   GFR calc non Af Amer 31 (L) >60 mL/min   GFR calc Af Amer 36 (L) >60 mL/min    Comment: (NOTE) The eGFR has been calculated using the CKD EPI equation. This calculation has not been validated in all clinical situations. eGFR's persistently <60 mL/min signify possible Chronic Kidney Disease.    Anion gap 9 5 - 15  CBC     Status: Abnormal   Collection Time: 11/06/15  4:00 PM  Result Value Ref Range   WBC 7.3 4.0 - 10.5 K/uL   RBC  4.42 3.87 - 5.11 MIL/uL   Hemoglobin 11.7 (L) 12.0 - 15.0 g/dL   HCT 36.9 36.0 - 46.0 %   MCV 83.5 78.0 -  100.0 fL   MCH 26.5 26.0 - 34.0 pg   MCHC 31.7 30.0 - 36.0 g/dL   RDW 14.9 11.5 - 15.5 %   Platelets 395 150 - 400 K/uL  I-stat troponin, ED (not at [facility], [facility])     Status: None   Collection Time: 11/06/15  4:10 PM  Result Value Ref Range   Troponin i, poc 0.00 0.00 - 0.08 ng/mL   Comment 3            Comment: Due to the release kinetics of cTnI, a negative result within the first hours of the onset of symptoms does not rule out myocardial infarction with certainty. If myocardial infarction is still suspected, repeat the test at appropriate intervals.   Glucose, capillary     Status: Abnormal   Collection Time: 11/06/15  9:36 PM  Result Value Ref Range   Glucose-Capillary 58 (L) 65 - 99 mg/dL   Comment 1 Notify RN    Comment 2 Document in Chart   Glucose, capillary     Status: Abnormal   Collection Time: 11/06/15 11:22 PM  Result Value Ref Range   Glucose-Capillary 124 (H) 65 - 99 mg/dL  Brain natriuretic peptide     Status: None   Collection Time: 11/07/15 12:19 AM  Result Value Ref Range   B Natriuretic Peptide 17.5 0.0 - 100.0 pg/mL  Troponin I     Status: None   Collection Time: 11/07/15 12:19 AM  Result Value Ref Range   Troponin I <0.03 <0.031 ng/mL    Comment:        NO INDICATION OF MYOCARDIAL INJURY.   TSH     Status: None   Collection Time: 11/07/15 12:19 AM  Result Value Ref Range   TSH 1.027 0.350 - 4.500 uIU/mL  Comprehensive metabolic panel     Status: Abnormal   Collection Time: 11/07/15  2:30 AM  Result Value Ref Range   Sodium 141 135 - 145 mmol/L   Potassium 4.7 3.5 - 5.1 mmol/L   Chloride 109 101 - 111 mmol/L   CO2 24 22 - 32 mmol/L   Glucose, Bld 71 65 - 99 mg/dL   BUN 16 6 - 20 mg/dL   Creatinine, Ser 1.17 (H) 0.44 - 1.00 mg/dL   Calcium 9.7 8.9 - 10.3 mg/dL   Total Protein 7.1 6.5 - 8.1 g/dL   Albumin 3.6 3.5 - 5.0 g/dL   AST 20 15 -  41 U/L   ALT 15 14 - 54 U/L   Alkaline Phosphatase 59 38 - 126 U/L   Total Bilirubin 0.5 0.3 - 1.2 mg/dL   GFR calc non Af Amer 48 (L) >60 mL/min   GFR calc Af Amer 55 (L) >60 mL/min    Comment: (NOTE) The eGFR has been calculated using the CKD EPI equation. This calculation has not been validated in all clinical situations. eGFR's persistently <60 mL/min signify possible Chronic Kidney Disease.    Anion gap 8 5 - 15  CBC WITH DIFFERENTIAL     Status: Abnormal   Collection Time: 11/07/15  2:30 AM  Result Value Ref Range   WBC 6.7 4.0 - 10.5 K/uL   RBC 4.14 3.87 - 5.11 MIL/uL   Hemoglobin 10.7 (L) 12.0 - 15.0 g/dL   HCT 34.6 (L) 36.0 - 46.0 %   MCV 83.6 78.0 - 100.0 fL   MCH 25.8 (L) 26.0 - 34.0 pg   MCHC 30.9 30.0 - 36.0 g/dL   RDW 15.1  11.5 - 15.5 %   Platelets 371 150 - 400 K/uL   Neutrophils Relative % 47 %   Neutro Abs 3.1 1.7 - 7.7 K/uL   Lymphocytes Relative 41 %   Lymphs Abs 2.7 0.7 - 4.0 K/uL   Monocytes Relative 8 %   Monocytes Absolute 0.5 0.1 - 1.0 K/uL   Eosinophils Relative 4 %   Eosinophils Absolute 0.3 0.0 - 0.7 K/uL   Basophils Relative 0 %   Basophils Absolute 0.0 0.0 - 0.1 K/uL  Glucose, capillary     Status: Abnormal   Collection Time: 11/07/15  7:44 AM  Result Value Ref Range   Glucose-Capillary 120 (H) 65 - 99 mg/dL  Glucose, capillary     Status: Abnormal   Collection Time: 11/07/15 12:43 PM  Result Value Ref Range   Glucose-Capillary 126 (H) 65 - 99 mg/dL  Glucose, capillary     Status: Abnormal   Collection Time: 11/07/15  4:16 PM  Result Value Ref Range   Glucose-Capillary 135 (H) 65 - 99 mg/dL  Glucose, capillary     Status: Abnormal   Collection Time: 11/07/15 10:41 PM  Result Value Ref Range   Glucose-Capillary 144 (H) 65 - 99 mg/dL  Comprehensive metabolic panel     Status: Abnormal   Collection Time: 11/08/15  3:57 AM  Result Value Ref Range   Sodium 143 135 - 145 mmol/L   Potassium 4.5 3.5 - 5.1 mmol/L   Chloride 109 101 - 111  mmol/L   CO2 26 22 - 32 mmol/L   Glucose, Bld 109 (H) 65 - 99 mg/dL   BUN 10 6 - 20 mg/dL   Creatinine, Ser 1.01 (H) 0.44 - 1.00 mg/dL   Calcium 9.9 8.9 - 10.3 mg/dL   Total Protein 6.9 6.5 - 8.1 g/dL   Albumin 3.7 3.5 - 5.0 g/dL   AST 18 15 - 41 U/L   ALT 13 (L) 14 - 54 U/L   Alkaline Phosphatase 55 38 - 126 U/L   Total Bilirubin 0.6 0.3 - 1.2 mg/dL   GFR calc non Af Amer 57 (L) >60 mL/min   GFR calc Af Amer >60 >60 mL/min    Comment: (NOTE) The eGFR has been calculated using the CKD EPI equation. This calculation has not been validated in all clinical situations. eGFR's persistently <60 mL/min signify possible Chronic Kidney Disease.    Anion gap 8 5 - 15  CBC     Status: Abnormal   Collection Time: 11/08/15  3:57 AM  Result Value Ref Range   WBC 6.0 4.0 - 10.5 K/uL   RBC 4.07 3.87 - 5.11 MIL/uL   Hemoglobin 10.6 (L) 12.0 - 15.0 g/dL   HCT 34.2 (L) 36.0 - 46.0 %   MCV 84.0 78.0 - 100.0 fL   MCH 26.0 26.0 - 34.0 pg   MCHC 31.0 30.0 - 36.0 g/dL   RDW 15.0 11.5 - 15.5 %   Platelets 342 150 - 400 K/uL  Glucose, capillary     Status: None   Collection Time: 11/08/15  7:57 AM  Result Value Ref Range   Glucose-Capillary 97 65 - 99 mg/dL     Lipid Panel  No results found for: CHOL, TRIG, HDL, CHOLHDL, VLDL, LDLCALC, LDLDIRECT   Lab Results  Component Value Date   HGBA1C 6.6* 08/20/2015   HGBA1C 6.9* 08/14/2015   HGBA1C 7.0* 08/09/2015     Lab Results  Component Value Date   CREATININE 1.01* 11/08/2015  HPI :MODUPE SHAMPINE is a 64 y.o. female with history of hypertension, diabetes mellitus type 2, hypercalcemia, schizoaffective disorder and memory disorder was brought to the ER the patient was found to have increasingly short of breath. Patient has recent low back surgery and was in rehabilitation discharge last week. Patient is feeling increasingly short of breath on walking over the last 3 days. Recently the d-dimer was elevated and CT angiogram of the chest  done in the ER was negative for PE but did show emphysematous changes. In addition to also showed right renal mass and pulmonary nodule. On exam patient is not in respiratory distress but patient states she gets short of breath and walking. Patient is also requiring oxygen to maintain her sats more than 90%. Presently on 2 L nasal cannula. Patient denies any chest pain productive cough fever chills nausea vomiting diarrhea. Patient's renal function also has deteriorated. Patient did receive 1 L normal saline bolus in the ER.   HOSPITAL COURSE: *   1. Acute respiratory failure with hypoxia - secondary to COPD exacerbation. Patient's symptoms are mostly exertional. CT angiogram was negative for PE. Follow results of 2-D echo to check for LV and RV function and also check for pulmonary hypertension. Wean oxygen, continue augmentin for another 5 days  2. Acute renal failure -improved. Creatinine 1.69> 1.17>1.01.[name] At this time I'm holding off patient's lisinopril until next week,  discontinued patient's NSAIDs. Received normal saline bolus.  Closely follow metabolic panel and repeat as outpt . 3. Pulmonary nodule - history of severe smoking and hypercalcemia will need close follow-up with pulmonologist. Patient also has right axillary lymph nodes that needs to be biopsied in addition to multiple other lymph nodes pretracheal, left hilar, lymph nodes that were discussed with the patient and her daughter. Most likely she can have a biopsy of her right axillary lymph node in the outpatient setting. No history of sarcoidosis. Patient states that she recently had a negative mammogram 4. Right renal mass - will need MRI to further study. Daughter is already aware of the left renal cyst and knows she needs an MRI  5. Diabetes mellitus type 2 - patient on glimepiride/ metformin and since patient has renal failure closely follow CBGs. I have placed patient on sliding scale coverage. Resume home meds at DC  6. Chronic  hypercalcemia - see #3. As per the outpatient notes patient's PTH was within acceptable limits. 7. COPD - presently not wheezing. Continue inhalers. 8. Recent low back surgery. 9. Anemia follow CBC. 10. Abnormal d-dimer-no  DVT on doppler     Discharge Exam:    Blood pressure 130/57, pulse 95, temperature 98 F (36.7 C), temperature source Oral, resp. rate 18, height _0  (1.6 m), weight 90.8 kg (200 lb 2.8 oz), SpO2 100 %.  General: No acute respiratory distress Lungs: Clear to auscultation bilaterally without wheezes or crackles Cardiovascular: Regular rate and rhythm without murmur gallop or rub normal S1 and S2 Abdomen: Nontender, nondistended, soft, bowel sounds positive, no rebound, no ascites, no appreciable mass Extremities: No significant cyanosis, clubbing, or edema bilateral lower extremities    Follow-up Information    Follow up with Millsaps, Luane School, NP. Schedule an appointment as soon as possible for a visit in 3 days.   Contact information:   [facility] [place] [address] [phone]       Signed: Reyne Dumas 11/08/2015, 9:55 AM        Time spent >45  mins

## 2015-11-08 NOTE — Evaluation (Signed)
Physical Therapy Evaluation Patient Details Name: Elizabeth Thompson MRN: [id number] DOB: [birth year] Today's Date: 11/08/2015   History of Present Illness  Patient is a 64 yo female admitted 11/06/15 with SOB.  Patient with acute respiratory failure with hypoxia - COPD exacerbation.  PMH:  HTN, DM, schizoaffective disorder, memory disorder, recent back surgery, COPD  Clinical Impression  Patient is functioning at Mod I level with all mobility and gait.  O2 sats remain at/above 91% on room air with mobility.  No f/u PT needs identified.      Follow Up Recommendations No PT follow up;Supervision - Intermittent    Equipment Recommendations  None recommended by PT    Recommendations for Other Services       Precautions / Restrictions Precautions Precautions: None Restrictions Weight Bearing Restrictions: No      Mobility  Bed Mobility Overal bed mobility: Modified Independent             General bed mobility comments: Use of rail for supine to sit.  Transfers Overall transfer level: Modified independent Equipment used: 4-wheeled walker                Ambulation/Gait Ambulation/Gait assistance: Modified independent (Device/Increase time) Ambulation Distance (Feet): 400 Feet Assistive device: 4-wheeled walker Gait Pattern/deviations: Step-through pattern;Decreased stride length   Gait velocity interpretation: at or above normal speed for age/gender General Gait Details: Patient demonstrates safe use of rollator for gait.  No loss of balance.  Patient with fast pace - encouraged patient to slow pace for safety and to minimize DOE.  Stairs            Wheelchair Mobility    Modified Rankin (Stroke Patients Only)       Balance                                             Pertinent Vitals/Pain Pain Assessment: No/denies pain  O2 sats:   At rest on O2 at 3 l/min - 99%                  At rest on room air - 93%                  During  gait on room air - 91-98%                  At rest following gait on room air - 96% HR:   At rest - 85 bpm          During ambulation - 110 bpm    Home Living Family/patient expects to be discharged to:: Private residence Living Arrangements: Other relatives Saddie Benders) Available Help at Discharge: Family;Available 24 hours/day;Available PRN/intermittently (Neice works Sport and exercise psychologist.  Nephew checks on patient Sat/Sun.) Type of Home: House Home Access: Stairs to enter Entrance Stairs-Rails: None Entrance Stairs-Number of Steps: 1 Home Layout: One level Home Equipment: Walker - 4 wheels;Shower seat;Wheelchair - manual      Prior Function Level of Independence: Independent with assistive device(s);Needs assistance   Gait / Transfers Assistance Needed: Uses rollator for ambulation  ADL's / Homemaking Assistance Needed: Able to do own bathing/dressing.  Assist with meal prep/housekeeping.        Hand Dominance   Dominant Hand: Right    Extremity/Trunk Assessment   Upper Extremity Assessment: Overall WFL for tasks assessed  Lower Extremity Assessment: Generalized weakness      Cervical / Trunk Assessment: Normal  Communication   Communication: No difficulties  Cognition Arousal/Alertness: Awake/alert Behavior During Therapy: WFL for tasks assessed/performed Overall Cognitive Status: History of cognitive impairments - at baseline                      General Comments General comments (skin integrity, edema, etc.): Patient able to maintain O2 sats at/above 91% on room air during mobility.  See vital sign section.    Exercises        Assessment/Plan    PT Assessment Patent does not need any further PT services  PT Diagnosis Generalized weakness   PT Problem List    PT Treatment Interventions     PT Goals (Current goals can be found in the Care Plan section) Acute Rehab PT Goals PT Goal Formulation: All assessment and education complete, DC therapy     Frequency     Barriers to discharge        Co-evaluation               End of Session Equipment Utilized During Treatment: Gait belt Activity Tolerance: Patient tolerated treatment well Patient left: in bed;with call bell/phone within reach;with family/visitor present (sitting EOB) Nurse Communication: Mobility status (O2 sats;  No f/u needs)         Time: 4360-6770 PT Time Calculation (min) (ACUTE ONLY): 26 min   Charges:   PT Evaluation $Initial PT Evaluation Tier I: 1 Procedure PT Treatments $Gait Training: 8-22 mins   PT G Codes:        Despina Pole 2015-12-03, 11:41 AM Carita Pian. Sanjuana Kava, [place] Pager [phone]

## 2015-11-08 NOTE — Progress Notes (Signed)
Elizabeth Thompson to be D/C'd Home per MD order. Discussed with the patient and all questions fully answered.    Medication List    STOP taking these medications        ibuprofen 200 MG tablet  Commonly known as:  ADVIL,MOTRIN      TAKE these medications        albuterol (2.5 MG/3ML) 0.083% nebulizer solution  Commonly known as:  PROVENTIL  Take 2.5 mg by nebulization every 6 (six) hours as needed for wheezing or shortness of breath.     albuterol 108 (90 BASE) MCG/ACT inhaler  Commonly known as:  PROVENTIL HFA;VENTOLIN HFA  Inhale 2 puffs into the lungs every 6 (six) hours as needed for wheezing or shortness of breath.     amoxicillin-clavulanate 875-125 MG tablet  Commonly known as:  AUGMENTIN  Take 1 tablet by mouth every 12 (twelve) hours.     ARIPiprazole 20 MG tablet  Commonly known as:  ABILIFY  Take 1 tablet (20 mg total) by mouth at bedtime.     budesonide-formoterol 160-4.5 MCG/ACT inhaler  Commonly known as:  SYMBICORT  Inhale 2 puffs into the lungs 2 (two) times daily.     donepezil 5 MG tablet  Commonly known as:  ARICEPT  Take 1 tablet (5 mg total) by mouth at bedtime.     glimepiride 4 MG tablet  Commonly known as:  AMARYL  Take 1 tablet (4 mg total) by mouth daily with breakfast.     hydrOXYzine 50 MG tablet  Commonly known as:  ATARAX/VISTARIL  Take 50 mg by mouth at bedtime.     Ipratropium-Albuterol 20-100 MCG/ACT Aers respimat  Commonly known as:  COMBIVENT  Inhale 1 puff into the lungs every 6 (six) hours.     lamoTRIgine 100 MG tablet  Commonly known as:  LAMICTAL  Take 1 tablet (100 mg total) by mouth every evening.     latanoprost 0.005 % ophthalmic solution  Commonly known as:  XALATAN  Place 1 drop into both eyes at bedtime.     lisinopril 40 MG tablet  Commonly known as:  PRINIVIL,ZESTRIL  Take 1 tablet (40 mg total) by mouth daily.  Start taking on:  11/12/2015     metFORMIN 1000 MG tablet  Commonly known as:  GLUCOPHAGE  Take  1 tablet (1,000 mg total) by mouth 2 (two) times daily with a meal.     methocarbamol 500 MG tablet  Commonly known as:  ROBAXIN  Take 1 tablet (500 mg total) by mouth 3 (three) times daily as needed for muscle spasms.     mirtazapine 7.5 MG tablet  Commonly known as:  REMERON  Take 1 tablet (7.5 mg total) by mouth at bedtime.     ondansetron 4 MG tablet  Commonly known as:  ZOFRAN  Take 1 tablet (4 mg total) by mouth every 8 (eight) hours as needed for nausea or vomiting.     oxyCODONE-acetaminophen 5-325 MG tablet  Commonly known as:  ROXICET  Take 1 tablet by mouth every 6 (six) hours as needed for severe pain. DO NOT EXCEED 4GM OF APAP IN 24 HOURS FROM ALL SOURCES     Selenium Sulf-Pyrithione-Urea 2.25 % Sham  Apply 1 application topically once a week. Applied to scalp for dermatitis     simvastatin 40 MG tablet  Commonly known as:  ZOCOR  Take 1 tablet (40 mg total) by mouth daily.     VITAMIN D3 PO  Take 1  tablet by mouth daily.        VVS, Skin clean, dry and intact without evidence of skin break down, no evidence of skin tears noted.  IV catheter discontinued intact. Site without signs and symptoms of complications. Dressing and pressure applied.  An After Visit Summary was printed and given to the patient.  Patient escorted via [place], and D/C home via private auto.  Audria Nine F  11/08/2015 2:42 PM

## 2015-11-08 NOTE — Progress Notes (Signed)
SATURATION QUALIFICATIONS: (This note is used to comply with regulatory documentation for home oxygen)  Patient Saturations on Room Air at Rest = 93%  Patient Saturations on Room Air while Ambulating = 91-98%  Patient Saturations on  Liters of oxygen while Ambulating = %  Please briefly explain why patient needs home oxygen:  No need for O2 at home   [place]. Sanjuana Kava, [place] Pager [phone]

## 2015-11-09 ENCOUNTER — Encounter (HOSPITAL_COMMUNITY): Payer: Self-pay | Admitting: Neurology

## 2015-11-09 ENCOUNTER — Emergency Department (HOSPITAL_COMMUNITY)
Admission: EM | Admit: 2015-11-09 | Discharge: 2015-11-09 | Disposition: A | Discharge status: Home / Self Care | Payer: Medicare Other | Attending: Emergency Medicine | Admitting: Emergency Medicine

## 2015-11-09 DIAGNOSIS — G8929 Other chronic pain: Secondary | ICD-10-CM | POA: Insufficient documentation

## 2015-11-09 DIAGNOSIS — M1990 Unspecified osteoarthritis, unspecified site: Secondary | ICD-10-CM | POA: Insufficient documentation

## 2015-11-09 DIAGNOSIS — Z79899 Other long term (current) drug therapy: Secondary | ICD-10-CM | POA: Insufficient documentation

## 2015-11-09 DIAGNOSIS — F319 Bipolar disorder, unspecified: Secondary | ICD-10-CM | POA: Diagnosis not present

## 2015-11-09 DIAGNOSIS — Z86018 Personal history of other benign neoplasm: Secondary | ICD-10-CM | POA: Insufficient documentation

## 2015-11-09 DIAGNOSIS — I10 Essential (primary) hypertension: Secondary | ICD-10-CM | POA: Diagnosis not present

## 2015-11-09 DIAGNOSIS — Z8719 Personal history of other diseases of the digestive system: Secondary | ICD-10-CM | POA: Diagnosis not present

## 2015-11-09 DIAGNOSIS — E785 Hyperlipidemia, unspecified: Secondary | ICD-10-CM | POA: Insufficient documentation

## 2015-11-09 DIAGNOSIS — Q6100 Congenital renal cyst, unspecified: Secondary | ICD-10-CM | POA: Insufficient documentation

## 2015-11-09 DIAGNOSIS — E119 Type 2 diabetes mellitus without complications: Secondary | ICD-10-CM | POA: Diagnosis not present

## 2015-11-09 DIAGNOSIS — Z792 Long term (current) use of antibiotics: Secondary | ICD-10-CM | POA: Diagnosis not present

## 2015-11-09 DIAGNOSIS — Z87891 Personal history of nicotine dependence: Secondary | ICD-10-CM | POA: Insufficient documentation

## 2015-11-09 DIAGNOSIS — R0902 Hypoxemia: Secondary | ICD-10-CM | POA: Diagnosis not present

## 2015-11-09 DIAGNOSIS — F209 Schizophrenia, unspecified: Secondary | ICD-10-CM | POA: Insufficient documentation

## 2015-11-09 DIAGNOSIS — H409 Unspecified glaucoma: Secondary | ICD-10-CM | POA: Insufficient documentation

## 2015-11-09 DIAGNOSIS — G4734 Idiopathic sleep related nonobstructive alveolar hypoventilation: Secondary | ICD-10-CM

## 2015-11-09 DIAGNOSIS — Z7951 Long term (current) use of inhaled steroids: Secondary | ICD-10-CM | POA: Insufficient documentation

## 2015-11-09 DIAGNOSIS — J441 Chronic obstructive pulmonary disease with (acute) exacerbation: Secondary | ICD-10-CM | POA: Insufficient documentation

## 2015-11-09 DIAGNOSIS — R0602 Shortness of breath: Secondary | ICD-10-CM | POA: Diagnosis present

## 2015-11-09 LAB — BASIC METABOLIC PANEL
ANION GAP: 10 (ref 5–15)
BUN: 9 mg/dL (ref 6–20)
CALCIUM: 10.2 mg/dL (ref 8.9–10.3)
CO2: 23 mmol/L (ref 22–32)
CREATININE: 1.19 mg/dL — AB (ref 0.44–1.00)
Chloride: 105 mmol/L (ref 101–111)
GFR calc non Af Amer: 47 mL/min — ABNORMAL LOW (ref >60)
GFR, EST AFRICAN AMERICAN: 54 mL/min — AB (ref >60)
Glucose, Bld: 146 mg/dL — ABNORMAL HIGH (ref 65–99)
POTASSIUM: 4.1 mmol/L (ref 3.5–5.1)
SODIUM: 138 mmol/L (ref 135–145)

## 2015-11-09 LAB — CBC
HCT: 37.4 % (ref 36.0–46.0)
Hemoglobin: 12 g/dL (ref 12.0–15.0)
MCH: 26.7 pg (ref 26.0–34.0)
MCHC: 32.1 g/dL (ref 30.0–36.0)
MCV: 83.1 fL (ref 78.0–100.0)
PLATELETS: 318 10*3/uL (ref 150–400)
RBC: 4.5 MIL/uL (ref 3.87–5.11)
RDW: 14.8 % (ref 11.5–15.5)
WBC: 6.4 10*3/uL (ref 4.0–10.5)

## 2015-11-09 NOTE — Care Management Note (Signed)
Case Management Note  Patient Details  Name: Elizabeth Thompson MRN: [id number] Date of Birth: Nov 27, 1951  Subjective/Objective:   64 y.o. F discharged from [facility] 11/08/2015 to home with [place] services through [place]. Did not qualify for [facility] Oxygen, yesterday but adult at bedside reports pt awakened in the night and c/o SOB. Has home Saturation monitor which read 79%. Caregiver gave pt Neb treatment and even then pt saturation did not rise above 85%.  Attempted to see PCP in clinic today and was directed to [facility]. Unfortunately [facility] cannot provide Oxygen based on Saturations monitored in the ED. Pt is stable and is not going to be admitted to the hospital today.             Action/Plan: Discharged today with  plan of seeing PCP on Monday or ASAP. Adult who accompanied pt to [facility] will monitor and record O2 saturations throughout weekend. Pt aware of things she can do to assist in the work of breathing such as Sleeping with elevated HOB, Humidification of air, avoiding exertion, use of regular medications, and avoiding outside as air quality is poor at present [place] Pt verbalized understanding.    Expected Discharge Date:                  Expected Discharge Plan:  [place]  In-House Referral:     Discharge planning Services  CM Consult  Post Acute Care Choice:  Durable Medical Equipment Choice offered to:  Patient, Sibling  DME Arranged:  Oxygen DME Agency:  [place]:    [place] Agency:     Status of Service:  In process, will continue to follow  Medicare Important Message Given:    Date Medicare IM Given:    Medicare IM give by:    Date Additional Medicare IM Given:    Additional Medicare Important Message give by:     If discussed at [place] of Stay Meetings, dates discussed:    Additional Comments:  Delrae Sawyers, RN 11/09/2015, 11:12 AM

## 2015-11-09 NOTE — ED Notes (Addendum)
Ambulated with walker that pt uses at home - O2 initially at 98%. Walked aprox 150 feet, O2 dropped to 89%. Once back in bed, pt stated that she felt difficulty catching her breath. Once in bed, O2 came up to 93% and continues to rise.

## 2015-11-09 NOTE — ED Notes (Signed)
MD at bedside. 

## 2015-11-09 NOTE — Discharge Instructions (Signed)
Chronic Obstructive Pulmonary Disease Chronic obstructive pulmonary disease (COPD) is a common lung problem. In COPD, the flow of air from the lungs is limited. The way your lungs work will probably never return to normal, but there are things you can do to improve your lungs and make yourself feel better. Your doctor may treat your condition with:  Medicines.  Oxygen.  Lung surgery.  Changes to your diet.  Rehabilitation. This may involve a team of specialists. HOME CARE  Take all medicines as told by your doctor.  Avoid medicines or cough syrups that dry up your airway (such as antihistamines) and do not allow you to get rid of thick spit. You do not need to avoid them if told differently by your doctor.  If you smoke, stop. Smoking makes the problem worse.  Avoid being around things that make your breathing worse (like smoke, chemicals, and fumes).  Use oxygen therapy and therapy to help improve your lungs (pulmonary rehabilitation) if told by your doctor. If you need home oxygen therapy, ask your doctor if you should buy a tool to measure your oxygen level (oximeter).  Avoid people who have a sickness you can catch (contagious).  Avoid going outside when it is very hot, cold, or humid.  Eat healthy foods. Eat smaller meals more often. Rest before meals.  Stay active, but remember to also rest.  Make sure to get all the shots (vaccines) your doctor recommends. Ask your doctor if you need a pneumonia shot.  Learn and use tips on how to relax.  Learn and use tips on how to control your breathing as told by your doctor. Try:  Breathing in (inhaling) through your nose for 1 second. Then, pucker your lips and breath out (exhale) through your lips for 2 seconds.  Putting one hand on your belly (abdomen). Breathe in slowly through your nose for 1 second. Your hand on your belly should move out. Pucker your lips and breathe out slowly through your lips. Your hand on your belly  should move in as you breathe out.  Learn and use controlled coughing to clear thick spit from your lungs. The steps are: 1. Lean your head a little forward. 2. Breathe in deeply. 3. Try to hold your breath for 3 seconds. 4. Keep your mouth slightly open while coughing 2 times. 5. Spit any thick spit out into a tissue. 6. Rest and do the steps again 1 or 2 times as needed. GET HELP IF:  You cough up more thick spit than usual.  There is a change in the color or thickness of the spit.  It is harder to breathe than usual.  Your breathing is faster than usual. GET HELP RIGHT AWAY IF:  You have shortness of breath while resting.  You have shortness of breath that stops you from:  Being able to talk.  Doing normal activities.  You chest hurts for longer than 5 minutes.  Your skin color is more blue than usual.  Your pulse oximeter shows that you have low oxygen for longer than 5 minutes. MAKE SURE YOU:  Understand these instructions.  Will watch your condition.  Will get help right away if you are not doing well or get worse.   This information is not intended to replace advice given to you by your health care provider. Make sure you discuss any questions you have with your health care provider.   Document Released: 05/18/2008 Document Revised: 12/21/2014 Document Reviewed: 07/27/2013 Elsevier Interactive Patient  Education 2016 [employer].

## 2015-11-09 NOTE — ED Notes (Signed)
Pt here with low oxygen while sleeping at night. Was discharged yesterday for COPD exacerbation. [name] caregiver at bedside. Pt does not use oxygen at home. Saturation is 93% in triage. Denies any sob or cp at current.

## 2015-11-09 NOTE — ED Provider Notes (Signed)
CSN: [id number]     Arrival date & time 11/09/15  1003 History   First MD Initiated Contact with Patient 11/09/15 1029     Chief Complaint  Patient presents with  . Shortness of Breath     (Consider location/radiation/quality/duration/timing/severity/associated sxs/prior Treatment) HPI  Patient is a 64 year old female with history of COPD, HTN, HLD, DM, and schizoaffective disorder presenting with nocturnal hypoxia. Patient was admitted 3 days ago with COPD exacerbation and discharged yesterday. Patient's niece stayed the night with her last night and checked her pulse ox while she was sleeping and found it to be in the low 80s and as low as 79%. This morning, the patient and her niece called her PCP's after hour line who directed them to present to the ED for further evaluation. Overall, patient reports that her shortness of breath has improved since her hospital admission. No chest pain. No nausea or vomiting. No recent illnesses.   Past Medical History  Diagnosis Date  . Hypertension   . Bell's palsy   . Glaucoma   . Schizo-affective psychosis ([place])   . Memory disorder 09/05/2014  . Uterine fibroid   . Hypercalcemia   . Hyperlipidemia   . Back pain   . Chronic low back pain 05/09/2015  . Bronchitis   . Bipolar disorder ([place])   . Frequency of urination   . Cyst of right kidney   . GERD (gastroesophageal reflux disease)   . Arthritis   . Diabetes mellitus without complication ([place])     type 2   Past Surgical History  Procedure Laterality Date  . Abdominal hysterectomy    . Tonsillectomy and adenoidectomy    . Appendectomy    . Colonoscopy w/ polypectomy    . Lumbar laminectomy/decompression microdiscectomy N/A 08/14/2015    Procedure: LUMBAR DECOMPRESSION MICRODISCECTOMY L3-S1;  Surgeon: Melina Schools, MD;  Location: [place];  Service: Orthopedics;  Laterality: N/A;   Family History  Problem Relation Age of Onset  . Dementia Mother   . Alzheimer's disease Mother   . Heart  disease Mother   . Hypertension Mother   . Diabetes Mother   . Diabetes Father   . Alzheimer's disease Sister   . Heart disease Brother   . Heart attack Brother    Social History  Substance Use Topics  . Smoking status: Former Smoker -- 0.50 packs/day for 28 years    Types: Cigarettes  . Smokeless tobacco: Never Used  . Alcohol Use: No   OB History    No data available     Review of Systems  Constitutional: Negative for fever and chills.  HENT: Negative.   Eyes: Negative.   Respiratory: Positive for shortness of breath.   Cardiovascular: Negative for chest pain.  Gastrointestinal: Negative.   Endocrine: Negative.   Genitourinary: Negative.   Musculoskeletal: Negative.   Skin: Negative.   Allergic/Immunologic: Negative.   Neurological: Negative.   Hematological: Negative.   Psychiatric/Behavioral: Negative.    Allergies  Codeine and Prednisone  Home Medications   Prior to Admission medications   Medication Sig Start Date End Date Taking? Authorizing Provider  albuterol (PROVENTIL HFA;VENTOLIN HFA) 108 (90 BASE) MCG/ACT inhaler Inhale 2 puffs into the lungs every 6 (six) hours as needed for wheezing or shortness of breath. 08/27/14  Yes Kerrie Buffalo, NP  albuterol (PROVENTIL) (2.5 MG/3ML) 0.083% nebulizer solution Take 2.5 mg by nebulization every 6 (six) hours as needed for wheezing or shortness of breath.   Yes Historical Provider,  MD  amoxicillin-clavulanate (AUGMENTIN) 875-125 MG tablet Take 1 tablet by mouth every 12 (twelve) hours. 11/08/15  Yes Reyne Dumas, MD  ARIPiprazole (ABILIFY) 20 MG tablet Take 1 tablet (20 mg total) by mouth at bedtime. 08/27/14  Yes Kerrie Buffalo, NP  budesonide-formoterol [facility]) 160-4.5 MCG/ACT inhaler Inhale 2 puffs into the lungs 2 (two) times daily.   Yes Historical Provider, MD  Cholecalciferol (VITAMIN D3 PO) Take 1 tablet by mouth daily.   Yes Historical Provider, MD  donepezil (ARICEPT) 5 MG tablet Take 1 tablet (5 mg  total) by mouth at bedtime. 08/27/14  Yes Kerrie Buffalo, NP  glimepiride (AMARYL) 4 MG tablet Take 1 tablet (4 mg total) by mouth daily with breakfast. 08/27/14  Yes Kerrie Buffalo, NP  hydrOXYzine (ATARAX/VISTARIL) 50 MG tablet Take 50 mg by mouth at bedtime.   Yes Historical Provider, MD  Ipratropium-Albuterol (COMBIVENT) 20-100 MCG/ACT AERS respimat Inhale 1 puff into the lungs every 6 (six) hours.   Yes Historical Provider, MD  lamoTRIgine (LAMICTAL) 100 MG tablet Take 1 tablet (100 mg total) by mouth every evening. 08/27/14  Yes Kerrie Buffalo, NP  latanoprost (XALATAN) 0.005 % ophthalmic solution Place 1 drop into both eyes at bedtime. 08/27/14  Yes Kerrie Buffalo, NP  lisinopril (PRINIVIL,ZESTRIL) 40 MG tablet Take 1 tablet (40 mg total) by mouth daily. 11/12/15  Yes Reyne Dumas, MD  metFORMIN (GLUCOPHAGE) 1000 MG tablet Take 1 tablet (1,000 mg total) by mouth 2 (two) times daily with a meal. 08/27/14  Yes Kerrie Buffalo, NP  methocarbamol (ROBAXIN) 500 MG tablet Take 1 tablet (500 mg total) by mouth 3 (three) times daily as needed for muscle spasms. 08/14/15  Yes Melina Schools, MD  mirtazapine (REMERON) 7.5 MG tablet Take 1 tablet (7.5 mg total) by mouth at bedtime. 08/27/14  Yes Kerrie Buffalo, NP  ondansetron (ZOFRAN) 4 MG tablet Take 1 tablet (4 mg total) by mouth every 8 (eight) hours as needed for nausea or vomiting. 08/14/15  Yes Melina Schools, MD  oxyCODONE-acetaminophen (ROXICET) 5-325 MG tablet Take 1 tablet by mouth every 6 (six) hours as needed for severe pain. DO NOT EXCEED 4GM OF APAP IN 24 HOURS FROM ALL SOURCES 10/10/15  Yes Lauree Chandler, NP  Selenium Sulf-Pyrithione-Urea 2.25 % SHAM Apply 1 application topically as needed. Applied to scalp for dermatitis 01/25/15  Yes Historical Provider, MD  simvastatin (ZOCOR) 40 MG tablet Take 1 tablet (40 mg total) by mouth daily. Patient taking differently: Take 40 mg by mouth every evening.  08/27/14  Yes Kerrie Buffalo, NP   BP 133/104  mmHg  Pulse 91  Temp(Src) 98.9 F (37.2 C) (Oral)  Resp 20  SpO2 93% Physical Exam  Constitutional: She is oriented to person, place, and time. She appears well-developed and well-nourished. No distress.  HENT:  Head: Normocephalic and atraumatic.  Eyes: EOM are normal. Pupils are equal, round, and reactive to light.  Neck: Normal range of motion. Neck supple.  Cardiovascular: Normal rate, regular rhythm and normal heart sounds.   No murmur heard. Pulmonary/Chest: Effort normal and breath sounds normal. No respiratory distress. She has no wheezes. She has no rales.  Abdominal: Soft. Bowel sounds are normal. She exhibits no distension. There is no tenderness.  Musculoskeletal: She exhibits no edema or tenderness.  Neurological: She is alert and oriented to person, place, and time. No cranial nerve deficit.  Skin: Skin is warm and dry.  Psychiatric: She has a normal mood and affect. Her behavior is normal.  Nursing  note and vitals reviewed.   ED Course  Procedures (including critical care time) Labs Review Labs Reviewed  BASIC METABOLIC PANEL - Abnormal; Notable for the following:    Glucose, Bld 146 (*)    Creatinine, Ser 1.19 (*)    GFR calc non Af Amer 47 (*)    GFR calc Af Amer 54 (*)    All other components within normal limits  CBC    Imaging Review No results found. I have personally reviewed and evaluated these images and lab results as part of my medical decision-making.   EKG Interpretation   Date/Time:  Saturday November 09 2015 10:23:28 EST Ventricular Rate:  94 PR Interval:  172 QRS Duration: 82 QT Interval:  376 QTC Calculation: 470 R Axis:   87 Text Interpretation:  Normal sinus rhythm Normal ECG Sinus rhythm  Non-specific intra-ventricular conduction delay No significant change  since last tracing Borderline ECG Confirmed by Carmin Muskrat  MD ([id number])  on 11/09/2015 10:30:42 AM      MDM   Final diagnoses:  Nocturnal hypoxia   Patient is a  64 year old female with history of COPD, HTN, HLD, DM, and schizoaffective disorder presenting with nocturnal hypoxia after being treated for COPD exacerbation earlier this week. Patient maintaining sat in the upper 90s on room air in ED. Lungs clear bilaterally with no wheezes or increased work of breathing. Nocturnal hypoxia likely related to patient's chronic lung disease. EKG negative. CTA 3 days ago negative for PE. Discussed with case Freight forwarder. Patient is not able to qualify for home oxygen based on an ED visit. She will need to follow up with her PCP to get home oxygen and evaluate possible need for sleep study. Will discharge patient home in stable condition.    Vivi Barrack, MD 11/09/15 1142  Carmin Muskrat, MD 11/09/15 [phone]

## 2015-11-20 ENCOUNTER — Encounter: Payer: Self-pay | Admitting: Neurology

## 2015-11-20 ENCOUNTER — Encounter (INDEPENDENT_AMBULATORY_CARE_PROVIDER_SITE_OTHER): Payer: Self-pay

## 2015-11-20 ENCOUNTER — Ambulatory Visit (INDEPENDENT_AMBULATORY_CARE_PROVIDER_SITE_OTHER): Payer: Medicare Other | Admitting: Neurology

## 2015-11-20 VITALS — BP 116/80 | HR 82 | Resp 20 | Ht 63.25 in | Wt 201.0 lb

## 2015-11-20 DIAGNOSIS — R0689 Other abnormalities of breathing: Secondary | ICD-10-CM | POA: Diagnosis not present

## 2015-11-20 DIAGNOSIS — R0683 Snoring: Secondary | ICD-10-CM | POA: Diagnosis not present

## 2015-11-20 DIAGNOSIS — M3509 Sicca syndrome with other organ involvement: Secondary | ICD-10-CM | POA: Diagnosis not present

## 2015-11-20 DIAGNOSIS — R0600 Dyspnea, unspecified: Secondary | ICD-10-CM

## 2015-11-20 DIAGNOSIS — G4710 Hypersomnia, unspecified: Secondary | ICD-10-CM

## 2015-11-20 DIAGNOSIS — G4730 Sleep apnea, unspecified: Secondary | ICD-10-CM | POA: Diagnosis not present

## 2015-11-20 DIAGNOSIS — J441 Chronic obstructive pulmonary disease with (acute) exacerbation: Secondary | ICD-10-CM | POA: Diagnosis not present

## 2015-11-20 DIAGNOSIS — R0902 Hypoxemia: Secondary | ICD-10-CM | POA: Diagnosis not present

## 2015-11-20 DIAGNOSIS — R918 Other nonspecific abnormal finding of lung field: Secondary | ICD-10-CM | POA: Diagnosis not present

## 2015-11-20 NOTE — Progress Notes (Signed)
SLEEP MEDICINE CLINIC   Provider:  Larey Thompson, M D  Referring Provider: Everardo Beals, NP Primary Care Physician:  Elizabeth Pillow, NP  Chief Complaint  Patient presents with  . New Patient (Initial Visit)    snoring, o2 drops to 80% during sleep, with niece/caregiver, rm 10    HPI:  Elizabeth Thompson is a 64 y.o. female , seen here as a referral  from NP  [place] / Elizabeth Thompson for a sleep evaluation, her primary neurologist is Dr. Floyde Thompson.  Chief complaint according to patient : "I am very sleepy and was diagnosed with low oxygen, nodules on my lung and COPD".  Elizabeth Thompson is a 64 year old African-American right-handed female presenting today for excessive daytime sleepiness. Her primary care provider Elizabeth Bile PA suspects that she may have obstructive sleep apnea. When she saw nurse practitioner Elizabeth Thompson in her clinic and pulse oximetry on room air was performed and showed that she only oxygenates to 86%.  A pulmonary workup had been initiated and also the patient had no wheezing ,nor rhonchi, she does carry now the diagnosis of COPD. She had in the past been seen for asthma, usually in spring and autumn. Allergic induced. The patient carries also the diagnosis of morbid obesity with a BMI of 36, diabetes mellitus type 2, osteoarthritis, gastroesophageal reflux disease, kidney cysts isolated not polycystic. She has frequency of urination even at night. Frequent bronchitis, should select active psychosis, hypertension, glaucoma.  I read here the notes from 11-07-15 by Dr. Trula Thompson,  Written after this patient was hospitalized for low oxygenation over the Thanksgiving holiday.  A lower extremity venous Doppler was performed and showed no occlusion.  In September 2016 she had a lower back surgery and then went to rehabilitation. Also she was mobilized was a concern that her low oxygenation may relate to a pulmonary embolism after surgery. Therefore the  lower been lower extremity Dopplers were performed. She presented to NP Elizabeth Thompson with an complaint of swelling in her lower leg and constant dyspnea. Echocardiogram was normal. She was prescribed albuterol and a nebulizer.    Sleep habits are as follows: Patient sleeps in her bed and usually goes to bed between 10 PM and midnight. She does have a TV in the bedroom and reports that the TV watches her but she sleeps. The bedroom is otherwise cool   not quite dark and quiet. She usually is asleep rather promptly she will have about 3 times at night interruptions in her sleep because she has the urge to urinate. She will fall asleep promptly again after those. She reports no palpitations, diaphoresis pain or discomfort that would wake her otherwise. Sometimes between 3 and 4 AM she will be noticing back pain. But she has had recently back surgery and this may improve. She rises usually at 5 AM, this is a habit since her working days. She initially feels actually rested and restored but by 10 AM she is tired again and often with evidence of a nap. By midday she will usually nap and sometimes her naps will take up to 2 hours. She usually keeps it at 1 nap but often up to 2 hours. She likes to walk but is currently using a walker. She is exposing herself usually to daylight only when she goes down the drive way.   Sleep medical history and family sleep history:  Unknown if anybody was diagnosed -many members snored.   Social history:  Former smoker, quit last  in August 2016, had at that time smoked for about 10 years. Prior to 2006 she also had to quit smoking for 3 years , previously smoked for about a decade.  Last visit at [facility] with Dr. Jannifer Thompson.   Ms. Elizabeth Thompson is a 64 year old right-handed white female with a schizoaffective disorder, admitted to the hospital on 08/16/2014 with psychosis. The patient has had some confusion coming out of that hospitalization, but over time, the family indicates that she has  gotten back to her usual baseline. MRI evaluation of the brain did not show acute abnormalities, and an EEG study was unremarkable. Blood work was unremarkable with exception that she appears to have chronic hypercalcemia. A workup for the hypercalcemia apparently was unrevealing. The patient is apparently doing quite well at this time. She returns for an evaluation.  Past Medical History  Diagnosis Date  . Hypertension   . Bell's palsy   . Glaucoma   . Diabetes mellitus without complication   . Schizo-affective psychosis   . Memory disorder 09/05/2014  . Uterine fibroid   . Hypercalcemia     Past Surgical History  Procedure Laterality Date  . Abdominal hysterectomy    . Tonsillectomy and adenoidectomy    . Appendectomy      Family History  Problem Relation Age of Onset  . Dementia Mother   . Alzheimer's disease Mother   . Heart disease Mother   . Hypertension Mother   . Diabetes Mother   . Diabetes Father   . Alzheimer's disease Sister   . Heart disease Brother   . Heart attack Brother     Social history:  reports that she has been smoking Cigarettes.  She has a 28 pack-year smoking history. She has never used smokeless tobacco. She reports that she does not drink alcohol or use illicit drugs.       Review of Systems: Out of a complete 14 system review, the patient complains of only the following symptoms, and all other reviewed systems are negative.   Epworth score 3 points on questions 1,3,4,5, and 7. 2 on 2,6 and 8.  =21  , Fatigue severity score 42   ,  PH Q depression score :   Social History   Social History  . Marital Status: Divorced    Spouse Name: N/A  . Number of Children: 1  . Years of Education: college 4   Occupational History  . disabled    Social History Main Topics  . Smoking status: Former Smoker -- 0.50 packs/day for 28 years    Types: Cigarettes  . Smokeless tobacco: Never Used  . Alcohol Use: No  . Drug Use: No  . Sexual Activity: No    Other Topics Concern  . Not on file   Social History Narrative   Patient is right handed.   Patient drinks 2 cups caffeine daily.    Family History  Problem Relation Age of Onset  . Dementia Mother   . Alzheimer's disease Mother   . Heart disease Mother   . Hypertension Mother   . Diabetes Mother   . Diabetes Father   . Alzheimer's disease Sister   . Heart disease Brother   . Heart attack Brother     Past Medical History  Diagnosis Date  . Hypertension   . Bell's palsy   . Glaucoma   . Schizo-affective psychosis ([place])   . Memory disorder 09/05/2014  . Uterine fibroid   . Hypercalcemia   . Hyperlipidemia   .  Back pain   . Chronic low back pain 05/09/2015  . Bronchitis   . Bipolar disorder ([place])   . Frequency of urination   . Cyst of right kidney   . GERD (gastroesophageal reflux disease)   . Arthritis   . Diabetes mellitus without complication ([place])     type 2    Past Surgical History  Procedure Laterality Date  . Abdominal hysterectomy    . Tonsillectomy and adenoidectomy    . Appendectomy    . Colonoscopy w/ polypectomy    . Lumbar laminectomy/decompression microdiscectomy N/A 08/14/2015    Procedure: LUMBAR DECOMPRESSION MICRODISCECTOMY L3-S1;  Surgeon: Melina Schools, MD;  Location: [facility];  Service: Orthopedics;  Laterality: N/A;    Current Outpatient Prescriptions  Medication Sig Dispense Refill  . albuterol (PROVENTIL HFA;VENTOLIN HFA) 108 (90 BASE) MCG/ACT inhaler Inhale 2 puffs into the lungs every 6 (six) hours as needed for wheezing or shortness of breath.    [name] albuterol (PROVENTIL) (2.5 MG/3ML) 0.083% nebulizer solution Take 2.5 mg by nebulization every 6 (six) hours as needed for wheezing or shortness of breath.    . ARIPiprazole (ABILIFY) 20 MG tablet Take 1 tablet (20 mg total) by mouth at bedtime. 30 tablet 0  . budesonide-formoterol (SYMBICORT) 160-4.5 MCG/ACT inhaler Inhale 2 puffs into the lungs 2 (two) times daily.    . Cholecalciferol  (VITAMIN D3 PO) Take 1 tablet by mouth daily.    . cyclobenzaprine (FLEXERIL) 10 MG tablet Take 10 mg by mouth 3 (three) times daily as needed for muscle spasms.    [name] donepezil (ARICEPT) 5 MG tablet Take 1 tablet (5 mg total) by mouth at bedtime. 30 tablet 0  . glimepiride (AMARYL) 4 MG tablet Take 1 tablet (4 mg total) by mouth daily with breakfast.    . hydrOXYzine (ATARAX/VISTARIL) 50 MG tablet Take 50 mg by mouth at bedtime.    . Ipratropium-Albuterol (COMBIVENT) 20-100 MCG/ACT AERS respimat Inhale 1 puff into the lungs every 6 (six) hours.    [name] lamoTRIgine (LAMICTAL) 100 MG tablet Take 1 tablet (100 mg total) by mouth every evening. 30 tablet 0  . latanoprost (XALATAN) 0.005 % ophthalmic solution Place 1 drop into both eyes at bedtime. 2.5 mL 12  . lisinopril (PRINIVIL,ZESTRIL) 40 MG tablet Take 1 tablet (40 mg total) by mouth daily.    . metFORMIN (GLUCOPHAGE) 1000 MG tablet Take 1 tablet (1,000 mg total) by mouth 2 (two) times daily with a meal.    . methocarbamol (ROBAXIN) 500 MG tablet Take 1 tablet (500 mg total) by mouth 3 (three) times daily as needed for muscle spasms. 60 tablet 0  . mirtazapine (REMERON) 7.5 MG tablet Take 1 tablet (7.5 mg total) by mouth at bedtime. 30 tablet 0  . ondansetron (ZOFRAN) 4 MG tablet Take 1 tablet (4 mg total) by mouth every 8 (eight) hours as needed for nausea or vomiting. 20 tablet 0  . oxyCODONE-acetaminophen (ROXICET) 5-325 MG tablet Take 1 tablet by mouth every 6 (six) hours as needed for severe pain. DO NOT EXCEED 4GM OF APAP IN 24 HOURS FROM ALL SOURCES 120 tablet 0  . Selenium Sulf-Pyrithione-Urea 2.25 % SHAM Apply 1 application topically as needed. Applied to scalp for dermatitis  3  . simvastatin (ZOCOR) 40 MG tablet Take 1 tablet (40 mg total) by mouth daily. (Patient taking differently: Take 40 mg by mouth every evening. ) 30 tablet    No current facility-administered medications for this visit.    Allergies  as of 11/20/2015 - Review  Complete 11/20/2015  Allergen Reaction Noted  . Codeine Nausea And Vomiting 08/14/2014  . Prednisone Other (See Comments) 08/14/2014    Vitals: BP 116/80 mmHg  Pulse 82  Resp 20  Ht 5' 3.25" (1.607 m)  Wt 201 lb (91.173 kg)  BMI 35.30 kg/m2 Last Weight:  Wt Readings from Last 1 Encounters:  11/20/15 201 lb (91.173 kg)   SPQ:ZRAQ mass index is 35.3 kg/(m^2).     Last Height:   Ht Readings from Last 1 Encounters:  11/20/15 5' 3.25" (1.607 m)    Physical exam:  General: The patient is awake, alert and appears not in acute distress. The patient is well groomed. Head: Normocephalic, atraumatic. Neck is supple. Mallampati 5 - uvula is not visible. She has an excess of soft tissue. She is edentulous.  neck circumference:16. Nasal airflow restricted during allergy season, TMJ click is not evident . Retrognathia is seen.  Cardiovascular:  Regular rate and rhythm, without  murmurs or carotid bruit, and without distended neck veins. Respiratory: Lungs are clear to auscultation. Skin:  Without evidence of edema, or rash Trunk: BMI is ed levated  The patient's posture is hunched.   Neurologic exam : The patient is awake and alert, oriented to place and time.   Memory subjective  described as intact. This is not the observation of her knees and other family members. Previous memory testing has been performed with Dr. Jannifer Thompson and is not part of today's evaluation mini-mental status exam   MOCA:No flowsheet data found. MMSE: MMSE - Mini Mental State Exam 05/09/2015 12/12/2014  Orientation to time 4 5  Orientation to Place 5 5  Registration 3 3  Attention/ Calculation 5 2  Recall 3 3  Language- name 2 objects 2 2  Language- repeat 1 1  Language- follow 3 step command 3 3  Language- read & follow direction 1 1  Write a sentence 1 1  Copy design 0 1  Total score 28 27       Attention span & concentration ability appears normal.  Speech is non  fluent,  with dysarthria, dysphonia ,  aphasia.  Mood and affect : the patient fell asleep   Cranial nerves: Pupils are equal and briskly reactive to light. Patient has a residual left-sided Bell's palsy. Her left eye is to be a the eyelids show some ptosis but her nasal labial fold is almost symmetric. Extraocular movements  in vertical and horizontal planes intact and without nystagmus. Visual fields by finger perimetry are intact. Hearing to finger rub intact.   Facial sensation intact to fine touch. Shoulder shrug was symmetrical.   Motor exam: Normal tone, muscle bulk and symmetric strength in upper extremities and chest wall , normal grip strength.  Sensory:  Fine touch, pinprick and vibration were tested in all extremities. Proprioception tested in the upper extremities was normal.  Coordination: Rapid alternating movements in the fingers/hands was normal. Finger-to-nose maneuver normal without evidence of ataxia, dysmetria or tremor.  Gait and station: Patient walks with a walker assistive device and is able unassisted to climb up to the exam table. Strength within normal limits.  Stance is stable and normal.   Deep tendon reflexes: in the  upper and lower extremities are symmetric and intact.   The patient was advised of the nature of the diagnosed sleep disorder , the treatment options and risks for general a health and wellness arising from not treating the condition.  I spent more  than 45 minutes of face to face time with the patient. Greater than 50% of time was spent in counseling and coordination of care. We have discussed the diagnosis and differential and I answered the patient's questions.     Assessment:  After physical and neurologic examination, review of laboratory studies,  Personal review of imaging studies, reports of other /same  Imaging studies ,  Results of polysomnography/ neurophysiology testing and pre-existing records as far as provided in visit., my assessment is   1) Elizabeth Thompson is at a  high risk of having obstructive sleep apnea.   Her clinical symptoms of extreme sleepiness and fatigue may be related to her the already diagnosed hypoxemia and cause of her primary underlying lung disorder,  but she is waking up with a dry mouth and has been known to snore loudly. She also breezes sometimes irregular. She seems to be a shallow breather even in daytime.   For this reason I will order an attended split-night polysomnography for the patient it may be necessary to titrate her with oxygen if her hypoxemia cannot be corrected by a CPAP device, showed apnea be found. I will ask our sleep To titrate her to oxygen- if her AHI is lower than 8 and her hypoxemia sustained !.   We will also measure for hypercapnia as a primary lung disorder has been identified in form of COPD, history of asthma, newly diagnosed lung nodules.  2) the patient has also an underlying schizoaffective disorder and if the depressive phase this would also often manifest as extreme fatigue and sleepiness. She feels at this time that her mood is balanced and she does not fit clinically depressed however. PH Q9 was 9 points.  3) obesity  4) Dr. Cruzita Lederer  is her endocrinologist and she has done 2 times 24-hour urine tests, she had hypercalcemia. Hyponatremia, and diabetes.  Plan:  Treatment plan and additional workup :  SPLIT , Oxygen should be titrated - this patient is already diagnosed with 2 pulmonary disorders, COPD, Asthma and lung nodules. This allows for titration in office.       Asencion Partridge Elizabeth Coppess MD  11/20/2015   CC: Elizabeth Thompson, [facility] [address], [place] [address]

## 2015-11-20 NOTE — Patient Instructions (Signed)
Hypersomnia Hypersomnia is when you feel extremely tired during the day even though you're getting plenty of sleep at night. You may need to take naps during the day, and you may also be extremely difficult to wake up when you are sleeping.  CAUSES  The cause of your hypersomnia may not be known. Hypersomnia may be caused by:   Medicines.  Sleep disorders, such as narcolepsy.  Trauma or injury to your head or nervous system.  Using drugs or alcohol.  Tumors.  Medical conditions, such as depression or hypothyroidism.  Genetics. SIGNS AND SYMPTOMS  The main symptoms of hypersomnia include:   Feeling extremely tired throughout the day.  Being very difficult to wake up.  Sleeping for longer and longer periods.  Taking naps throughout the day. Other symptoms may include:   Feeling:  Restless.  Annoyed.  Anxious.  Low energy.  Having difficulty:  Remembering.  Speaking.  Thinking.  Losing your appetite.  Experiencing hallucinations. DIAGNOSIS  Hypersomnia may be diagnosed by:  Medical history and physical exam. This will include a sleep history.  Completing sleep logs.  Tests may also be done, such as:  Polysomnography.  Multiple sleep latency test (MSLT). TREATMENT  There is no cure for hypersomnia, but treatment can be very effective in helping manage the condition. Treatment may include:  Lifestyle and sleeping strategies to help cope with the condition.  Stimulant medicines.  Treating any underlying causes of hypersomnia. HOME CARE INSTRUCTIONS  Take medicines only as directed by your health care provider.  Schedule short naps for when you feel sleepiest during the day. Tell your employer or teachers that you have hypersomnia. You may be able to adjust your schedule to include time for naps.  Avoid drinking alcohol or caffeinated beverages.  Do not eat a heavy meal before bedtime. Eat at about the same times every day.  Do not drive or  operate heavy machinery if you are sleepy.  Do not swim or go out on the water without a life jacket.  If possible, adjust your schedule so that you do not have to work or be active at night.  Keep all follow-up visits as directed by your health care provider. This is important. SEEK MEDICAL CARE IF:   You have new symptoms.  Your symptoms get worse. SEEK IMMEDIATE MEDICAL CARE IF:  You have serious thoughts of hurting yourself or someone else.   This information is not intended to replace advice given to you by your health care provider. Make sure you discuss any questions you have with your health care provider.   Document Released: 11/20/2002 Document Revised: 12/21/2014 Document Reviewed: 07/05/2014 Elsevier Interactive Patient Education [employer].

## 2015-11-22 ENCOUNTER — Institutional Professional Consult (permissible substitution): Payer: Self-pay | Admitting: Internal Medicine

## 2015-12-05 ENCOUNTER — Encounter: Payer: Self-pay | Admitting: Internal Medicine

## 2015-12-05 ENCOUNTER — Ambulatory Visit (INDEPENDENT_AMBULATORY_CARE_PROVIDER_SITE_OTHER): Payer: Medicare Other | Admitting: Internal Medicine

## 2015-12-05 VITALS — BP 128/80 | HR 98 | Ht 63.25 in | Wt 202.0 lb

## 2015-12-05 DIAGNOSIS — J449 Chronic obstructive pulmonary disease, unspecified: Secondary | ICD-10-CM | POA: Diagnosis not present

## 2015-12-05 DIAGNOSIS — I10 Essential (primary) hypertension: Secondary | ICD-10-CM | POA: Diagnosis not present

## 2015-12-05 DIAGNOSIS — R911 Solitary pulmonary nodule: Secondary | ICD-10-CM | POA: Diagnosis not present

## 2015-12-05 MED ORDER — IRBESARTAN 300 MG PO TABS: 300.0000 mg | ORAL_TABLET | Freq: Every day | ORAL | Status: DC

## 2015-12-05 NOTE — Patient Instructions (Signed)
Stop lisinopril   Start avapro (ibesartan) 300 mg one daily - ok to break in half if too strong  GERD (REFLUX)  is an extremely common cause of respiratory symptoms just like yours , many times with no obvious heartburn at all.    It can be treated with medication, but also with lifestyle changes including elevation of the head of your bed (ideally with 6 inch  bed blocks),  Smoking cessation, avoidance of late meals, excessive alcohol, and avoid fatty foods, chocolate, peppermint, colas, red wine, and acidic juices such as orange juice.  NO MINT OR MENTHOL PRODUCTS SO NO COUGH DROPS  USE SUGARLESS CANDY INSTEAD (Jolley ranchers or Stover's or Life Savers) or even ice chips will also do - the key is to swallow to prevent all throat clearing. NO OIL BASED VITAMINS - use powdered substitutes.   Please schedule a follow up office visit in 6 weeks with pfts, call sooner if needed

## 2015-12-05 NOTE — Progress Notes (Signed)
Subjective:    Patient ID: Elizabeth Thompson, female    DOB: Sep 13, 1951,    MRN: [id number]  HPI  70 yobf quit smoking aug 2016 with onset of wheezing sob while in rehab p back surgery and some better while in rehab but generally worse since at home since Nov 8th >  On multiple meds no better > referred to pulmonary clinic 12/05/2015 by Barbee Shropshire    12/05/2015 1st [facility] office visit/ Earsie Humm   Chief Complaint  Patient presents with  . Pulmonary Consult    Referred by Everardo Beals. Pt c/o SOB and low o2 sats for the past 3 wks.    doe = MMRC1 = can walk nl pace, flat grade, can't hurry or go uphills or steps s sob  But this is worse since stopped smoking assoc with dry daytime cough and sense of pnds.  Was apparently not needing any resp meds while smoking now no response to multiple inhalers/ nebs   No obvious other patterns in day to day or daytime variabilty or assoc excess/ purulent sputum or mucus plugs   or cp or chest tightness, subjective wheeze overt sinus or hb symptoms. No unusual exp hx or h/o childhood pna/ asthma or knowledge of premature birth.  Sleeping ok without nocturnal  or early am exacerbation  of respiratory  c/o's or need for noct saba. Also denies any obvious fluctuation of symptoms with weather or environmental changes or other aggravating or alleviating factors except as outlined above   Current Medications, Allergies, Complete Past Medical History, Past Surgical History, Family History, and Social History were reviewed in [employer] record.              Review of Systems  Constitutional: Negative for fever, chills and unexpected weight change.  HENT: Negative for congestion, dental problem, ear pain, nosebleeds, postnasal drip, rhinorrhea, sinus pressure, sneezing, sore throat, trouble swallowing and voice change.   Eyes: Negative for visual disturbance.  Respiratory: Positive for cough and shortness of breath.  Negative for choking.   Cardiovascular: Negative for chest pain and leg swelling.  Gastrointestinal: Negative for vomiting, abdominal pain and diarrhea.  Genitourinary: Negative for difficulty urinating.  Musculoskeletal: Negative for arthralgias.  Skin: Negative for rash.  Neurological: Negative for tremors, syncope and headaches.  Hematological: Does not bruise/bleed easily.       Objective:   Physical Exam amb obese bf nad with prominent pseudowheeze  Wt Readings from Last 3 Encounters:  12/05/15 202 lb (91.627 kg)  11/20/15 201 lb (91.173 kg)  11/07/15 200 lb 2.8 oz (90.8 kg)    Vital signs reviewed  HEENT: nl dentition, turbinates, and oropharynx. Nl external ear canals without cough reflex   NECK :  without JVD/Nodes/TM/ nl carotid upstrokes bilaterally   LUNGS: no acc muscle use,  Nl contour chest which is clear to A and P bilaterally without cough on insp or exp maneuvers   CV:  RRR  no s3 or murmur or increase in P2, no edema   ABD:  soft and nontender with nl inspiratory excursion in the supine position. No bruits or organomegaly, bowel sounds nl  MS:  Nl gait/ ext warm without deformities, calf tenderness, cyanosis or clubbing No obvious joint restrictions   SKIN: warm and dry without lesions    NEURO:  alert, approp, nl sensorium with  no motor deficits     I personally reviewed images and agree with radiology impression as follows:  CTa  chest  11/06/15 No demonstrable pulmonary embolus.  Adenopathy of uncertain etiology.  Underlying emphysema. No edema or consolidation. 4 mm nodular opacity anterior segment right upper lobe. Followup of this nodular opacity should be based on Fleischner Society guidelines. Given risk factors for bronchogenic carcinoma, follow-up chest CT at 1 year is recommended.         Assessment & Plan:

## 2015-12-06 ENCOUNTER — Encounter: Payer: Self-pay | Admitting: Internal Medicine

## 2015-12-06 DIAGNOSIS — I10 Essential (primary) hypertension: Secondary | ICD-10-CM | POA: Insufficient documentation

## 2015-12-06 DIAGNOSIS — J449 Chronic obstructive pulmonary disease, unspecified: Secondary | ICD-10-CM | POA: Insufficient documentation

## 2015-12-06 NOTE — Assessment & Plan Note (Signed)
In the best review of chronic cough to date ( NEJM 2016 375 [phone]) ,  ACEi are now felt to cause cough in up to  20% of pts which is a 4 fold increase from previous reports and does not include the variety of non-specific complaints we see in pulmonary clinic in pts on ACEi but previously attributed to copd/asthma to include PNDS, throat and chest congestion, "bronchitis", unexplained dyspnea and noct "strangling" sensations as well as atypical /refractory GERD symptoms like atypical dysphagia.   The only way to prove this is not an "ACEi Case" is a trial off ACEi x a minimum of 6 weeks then regroup.   Try avapro 300 mg daily

## 2015-12-06 NOTE — Assessment & Plan Note (Signed)
  When respiratory symptoms begin or become refractory well after a patient reports complete smoking cessation,  Especially when this wasn't the case while they were smoking, a red flag is raised based on the work of Dr Kris Mouton which states:  if you quit smoking when your best day FEV1 is still well preserved it is highly unlikely you will progress to severe disease.  That is to say, once the smoking stops,  the symptoms should not suddenly erupt or markedly worsen.  If so, the differential diagnosis should include  obesity/deconditioning,  LPR/Reflux/Aspiration syndromes,  occult CHF, or  especially side effect of medications commonly used in this population, especially as in this case ACEi > see hbp  Will try off acei x 6 weeks then return in 6 weeks for pfts and regroup then

## 2015-12-06 NOTE — Assessment & Plan Note (Addendum)
CT chest 11/06/15 x 68m RUL > former smoker > repeat one year placed in tickle file   CT results reviewed with pt >>> Too small for PET or bx, not suspicious enough for excisional bx > really only option for now is follow the Fleischner society guidelines as rec by radiology.   Discussed in detail all the  indications, usual  risks and alternatives  relative to the benefits with patient who agrees to proceed with conservative f/u as outlined  = 1 year f/u ct   Total time devoted to counseling  = 35/60 m review case with pt/ discussion of options/alternatives/ giving and going over instructions (see avs)

## 2015-12-12 ENCOUNTER — Institutional Professional Consult (permissible substitution): Payer: Self-pay | Admitting: Internal Medicine

## 2016-01-01 ENCOUNTER — Ambulatory Visit (INDEPENDENT_AMBULATORY_CARE_PROVIDER_SITE_OTHER): Payer: Medicare Other | Admitting: Neurology

## 2016-01-01 DIAGNOSIS — G4730 Sleep apnea, unspecified: Secondary | ICD-10-CM | POA: Diagnosis not present

## 2016-01-01 DIAGNOSIS — J441 Chronic obstructive pulmonary disease with (acute) exacerbation: Secondary | ICD-10-CM

## 2016-01-01 DIAGNOSIS — M3509 Sicca syndrome with other organ involvement: Secondary | ICD-10-CM

## 2016-01-01 DIAGNOSIS — G4710 Hypersomnia, unspecified: Secondary | ICD-10-CM

## 2016-01-01 DIAGNOSIS — R918 Other nonspecific abnormal finding of lung field: Secondary | ICD-10-CM

## 2016-01-02 NOTE — Sleep Study (Signed)
Please see the scanned sleep study interpretation located in the procedure tab within the chart review section.

## 2016-01-07 ENCOUNTER — Telehealth: Payer: Self-pay

## 2016-01-07 NOTE — Telephone Encounter (Signed)
Called pt to discuss sleep study results. No answer, left a message asking her to call me back.

## 2016-01-13 ENCOUNTER — Telehealth: Payer: Self-pay | Admitting: Internal Medicine

## 2016-01-13 NOTE — Telephone Encounter (Signed)
Spoke with pt regarding her sleep study results. I advised her that her study revealed hypoxemia and that her study did not show significant sleep apnea or significant periodic limb movements of sleep results in significant sleep disruption. I advised her to avoid sedative hypnotics, alcohol, tobacco, caffeine containing beverages, and chocolate. I advised him that she may try to lose weight, diet, and exercise if her other physicians agree. I advised her that Dr. Brett Fairy recommends considering starting o2 at 2lpm in sleep. Pt states she is already on 2L of o2 at night. Pt declined a follow up visit at this time and asked that this sleep study be faxed to Marella Bile, [place] referring physician.

## 2016-01-13 NOTE — Telephone Encounter (Signed)
Fine with me

## 2016-01-13 NOTE — Telephone Encounter (Signed)
lmtcb X1 for mandy with [facility]

## 2016-01-13 NOTE — Telephone Encounter (Signed)
Spoke with [employer]. States that they have provided the pt with night time oxygen. They didn't get an ONO done. [facility] is now needing for the pt to have a qualifying walk done in the office. They just need a test to prove that the pt needs oxygen.  MW - are you okay with [place] scheduling this for [facility]? Thanks.  Patient Instructions     Stop lisinopril   Start avapro (ibesartan) 300 mg one daily - ok to break in half if too strong  GERD (REFLUX) is an extremely common cause of respiratory symptoms just like yours , many times with no obvious heartburn at all.   It can be treated with medication, but also with lifestyle changes including elevation of the head of your bed (ideally with 6 inch bed blocks), Smoking cessation, avoidance of late meals, excessive alcohol, and avoid fatty foods, chocolate, peppermint, colas, red wine, and acidic juices such as orange juice.  NO MINT OR MENTHOL PRODUCTS SO NO COUGH DROPS  USE SUGARLESS CANDY INSTEAD (Jolley ranchers or Stover's or Life Savers) or even ice chips will also do - the key is to swallow to prevent all throat clearing. NO OIL BASED VITAMINS - use powdered substitutes.   Please schedule a follow up office visit in 6 weeks with pfts, call sooner if needed

## 2016-01-14 NOTE — Telephone Encounter (Signed)
Spoke with Leafy Ro at [facility], clarified that pt needs a qualifying 02 walk and NOT a 69m.  Pt is scheduled for an ov on Friday with MW, this can be done at ov.    Forwarding to [place] make aware.  Nothing further needed at this time.

## 2016-01-16 ENCOUNTER — Other Ambulatory Visit: Payer: Self-pay | Admitting: Internal Medicine

## 2016-01-16 DIAGNOSIS — R0600 Dyspnea, unspecified: Secondary | ICD-10-CM

## 2016-01-17 ENCOUNTER — Ambulatory Visit (INDEPENDENT_AMBULATORY_CARE_PROVIDER_SITE_OTHER): Payer: Medicare Other | Admitting: Internal Medicine

## 2016-01-17 ENCOUNTER — Encounter: Payer: Self-pay | Admitting: Internal Medicine

## 2016-01-17 ENCOUNTER — Ambulatory Visit: Payer: Medicare Other | Admitting: Podiatry

## 2016-01-17 VITALS — BP 126/86 | HR 87 | Ht 64.0 in | Wt 198.0 lb

## 2016-01-17 DIAGNOSIS — I10 Essential (primary) hypertension: Secondary | ICD-10-CM | POA: Diagnosis not present

## 2016-01-17 DIAGNOSIS — R911 Solitary pulmonary nodule: Secondary | ICD-10-CM

## 2016-01-17 DIAGNOSIS — R0600 Dyspnea, unspecified: Secondary | ICD-10-CM

## 2016-01-17 DIAGNOSIS — J449 Chronic obstructive pulmonary disease, unspecified: Secondary | ICD-10-CM | POA: Diagnosis not present

## 2016-01-17 LAB — PULMONARY FUNCTION TEST
DL/VA % PRED: 49 %
DL/VA: 2.39 ml/min/mmHg/L
DLCO UNC: 9.14 ml/min/mmHg
DLCO unc % pred: 37 %
FEF 25-75 POST: 2.64 L/s
FEF 25-75 PRE: 2.6 L/s
FEF2575-%Change-Post: 1 %
FEF2575-%PRED-PRE: 138 %
FEF2575-%Pred-Post: 141 %
FEV1-%Change-Post: 0 %
FEV1-%PRED-POST: 105 %
FEV1-%Pred-Pre: 104 %
FEV1-Post: 2.07 L
FEV1-Pre: 2.05 L
FEV1FVC-%Change-Post: 2 %
FEV1FVC-%PRED-PRE: 111 %
FEV6-%Change-Post: -1 %
FEV6-%PRED-POST: 95 %
FEV6-%Pred-Pre: 97 %
FEV6-POST: 2.32 L
FEV6-Pre: 2.36 L
FEV6FVC-%CHANGE-POST: 0 %
FEV6FVC-%PRED-POST: 104 %
FEV6FVC-%Pred-Pre: 103 %
FVC-%Change-Post: -1 %
FVC-%PRED-PRE: 94 %
FVC-%Pred-Post: 92 %
FVC-PRE: 2.36 L
FVC-Post: 2.32 L
PRE FEV1/FVC RATIO: 87 %
Post FEV1/FVC ratio: 89 %
Post FEV6/FVC ratio: 100 %
Pre FEV6/FVC Ratio: 100 %
RV % pred: 94 %
RV: 1.99 L
TLC % PRED: 97 %
TLC: 4.91 L

## 2016-01-17 NOTE — Progress Notes (Signed)
PFT done today.

## 2016-01-17 NOTE — Progress Notes (Signed)
Subjective:    Patient ID: Elizabeth Thompson, female    DOB: [birth year],    MRN: [id number]    Brief patient profile:  34 yobf quit smoking aug 2016 with onset of wheezing sob while in rehab p back surgery and some better while in rehab but generally worse since at home since Nov 8th >  On multiple meds no better > referred to pulmonary clinic 12/05/2015 by Barbee Shropshire     History of Present Illness  12/05/2015 1st [facility] office visit/ Elizabeth Thompson   Chief Complaint  Patient presents with  . Pulmonary Consult    Referred by Everardo Beals. Pt c/o SOB and low o2 sats for the past 3 wks.    doe = MMRC1 = can walk nl pace, flat grade, can't hurry or go uphills or steps s sob  But this is worse since stopped smoking assoc with dry daytime cough and sense of pnds.  Was apparently not needing any resp meds while smoking now no response to multiple inhalers/ nebs  rec Stop lisinopril  Start avapro (ibesartan) 300 mg one daily - ok to break in half if too strong GERD diet   01/17/2016  f/u ov/[name] re: GOLD 0  Chief Complaint  Patient presents with  . Follow-up    PFT done today. PFT done today. She has had some swelling in her ankles and feet.    no cough off acei/ doe = baseline = fine flat at nl pace   No obvious day to day or daytime variability or assoc chronic cough or cp or chest tightness, subjective wheeze or overt sinus or hb symptoms. No unusual exp hx or h/o childhood pna/ asthma or knowledge of premature birth.  Sleeping ok without nocturnal  or early am exacerbation  of respiratory  c/o's or need for noct saba. Also denies any obvious fluctuation of symptoms with weather or environmental changes or other aggravating or alleviating factors except as outlined above   Current Medications, Allergies, Complete Past Medical History, Past Surgical History, Family History, and Social History were reviewed in [employer] record.  ROS  The following are not  active complaints unless bolded sore throat, dysphagia, dental problems, itching, sneezing,  nasal congestion or excess/ purulent secretions, ear ache,   fever, chills, sweats, unintended wt loss, classically pleuritic or exertional cp, hemoptysis,  orthopnea pnd or leg swelling, presyncope, palpitations, abdominal pain, anorexia, nausea, vomiting, diarrhea  or change in bowel or bladder habits, change in stools or urine, dysuria,hematuria,  rash, arthralgias, visual complaints, headache, numbness, weakness or ataxia or problems with walking or coordination,  change in mood/affect or memory.            Objective:   Physical Exam  amb obese bf nad     01/17/2016        198   12/05/15 202 lb (91.627 kg)  11/20/15 201 lb (91.173 kg)  11/07/15 200 lb 2.8 oz (90.8 kg)    Vital signs reviewed  HEENT: nl dentition, turbinates, and oropharynx. Nl external ear canals without cough reflex   NECK :  without JVD/Nodes/TM/ nl carotid upstrokes bilaterally   LUNGS: no acc muscle use,  Nl contour chest which is clear to A and P bilaterally without cough on insp or exp maneuvers   CV:  RRR  no s3 or murmur or increase in P2, no edema   ABD:  soft and nontender with nl inspiratory excursion in the supine position. No  bruits or organomegaly, bowel sounds nl  MS:  Nl gait/ ext warm without deformities, calf tenderness, cyanosis or clubbing No obvious joint restrictions   SKIN: warm and dry without lesions    NEURO:  alert, approp, nl sensorium with  no motor deficits     I personally reviewed images and agree with radiology impression as follows:  CTa chest  11/06/15 No demonstrable pulmonary embolus. Adenopathy of uncertain etiology. Underlying emphysema. No edema or consolidation. 4 mm nodular opacity anterior segment right upper lobe. Followup of this nodular opacity should be based on Fleischner Society guidelines. Given risk factors for bronchogenic carcinoma, follow-up chest CT at  1 year is recommended.         Assessment & Plan:

## 2016-01-17 NOTE — Patient Instructions (Signed)
You do not have significant copd and you never will unless you resume smoking  Your weight is the main issue with your breathing    If you are satisfied with your treatment plan,  let your doctor know and he/she can either refill your medications or you can return here when your prescription runs out.     If in any way you are not 100% satisfied,  please tell us.  If 100% better, tell your friends!  Pulmonary follow up is as needed  But we will call you in Novemer 2007 for your follow up CT

## 2016-01-18 ENCOUNTER — Encounter: Payer: Self-pay | Admitting: Internal Medicine

## 2016-01-18 NOTE — Assessment & Plan Note (Addendum)
PFT's  01/17/2016  FEV1 2.07 (105 % ) ratio 89  p 0 % improvement from saba with DLCO  37 % corrects to 49 % for alv volume with ERV 34    I had an extended final summary discussion with the patient reviewing all relevant studies completed to date and  lasting 15 to 20 minutes of a 25 minute visit on the following issues:    I reviewed the Fletcher curve with the patient that basically indicates  if you quit smoking when your best day FEV1 is still well preserved (as is clearly  the case here)  it is highly unlikely you will progress to severe disease and informed the patient there was no medication on the market that has proven to alter the curve/ its downward trajectory  or the likelihood of progression of their disease.  Therefore stopping smoking and maintaining abstinence is the most important aspect of care, not choice of inhalers > ok to try off symbiocort now to see if just does just as well for saba prn   Pulmonary f/u is prn and resume the symbicort if needed (could still have AB component)

## 2016-01-18 NOTE — Assessment & Plan Note (Signed)
pfts with ERV 34% 01/17/2016  Body mass index is 33.97   Lab Results  Component Value Date   TSH 1.027 11/07/2015     Contributing to gerd tendency/ doe/reviewed the need and the process to achieve and maintain neg calorie balance > defer f/u primary care including intermittently monitoring thyroid status

## 2016-01-18 NOTE — Assessment & Plan Note (Signed)
CT chest 11/06/15 x 82m RUL > former smoker > repeat one year placed in tickle file

## 2016-01-18 NOTE — Assessment & Plan Note (Signed)
Trial off acei 12/05/2015 due to pseudowheeze/ cough > resolved 01/17/2016 and bp well controlled   Although even in retrospect it may not be clear the ACEi contributed to the pt's symptoms,  Pt improved off them and adding them back at this point or in the future would risk confusion in interpretation of non-specific respiratory symptoms to which this patient is prone  ie  Better not to muddy the waters here.   Follow up per Primary Care planned

## 2016-01-27 ENCOUNTER — Telehealth: Payer: Self-pay | Admitting: Internal Medicine

## 2016-01-27 NOTE — Telephone Encounter (Signed)
Called spoke with [place] from [facility]. I advised her at pt 01/17/16 OV, no walk test was done according to chart. She will contact pt PCP. Nothing further needed

## 2016-02-07 ENCOUNTER — Encounter: Payer: Self-pay | Admitting: Podiatry

## 2016-02-07 ENCOUNTER — Ambulatory Visit (INDEPENDENT_AMBULATORY_CARE_PROVIDER_SITE_OTHER): Payer: Medicare Other | Admitting: Podiatry

## 2016-02-07 VITALS — BP 180/112 | HR 96 | Resp 14

## 2016-02-07 DIAGNOSIS — B351 Tinea unguium: Secondary | ICD-10-CM | POA: Diagnosis not present

## 2016-02-07 DIAGNOSIS — M79676 Pain in unspecified toe(s): Secondary | ICD-10-CM

## 2016-02-07 NOTE — Progress Notes (Signed)
   Subjective:    Patient ID: Elizabeth Thompson, female    DOB: 22-Sep-1951, 65 y.o.   MRN: [id number]  HPI this patient presents to the office with chief complaint of painful long thick nails. Patient states the nails are painful as she walks and wears her shoes. Patient stayed states that she is a diabetic without any complications. She presents the office for preventive foot care services    Review of Systems  Constitutional: Positive for activity change.  HENT:       Sinus problems  Respiratory: Positive for cough.   Gastrointestinal: Positive for diarrhea.  Genitourinary: Positive for urgency.  Musculoskeletal: Positive for back pain and gait problem.  Skin:       Change in nails  Allergic/Immunologic: Positive for environmental allergies.  Neurological: Positive for tremors.       Objective:   Physical Exam GENERAL APPEARANCE: Alert, conversant. Appropriately groomed. No acute distress.  VASCULAR: Pedal pulses palpable at  [facility] and PT bilateral.  Capillary refill time is immediate to all digits,  Normal temperature gradient.  Digital hair growth is present bilateral  NEUROLOGIC: sensation is normal to 5.07 monofilament at 5/5 sites bilateral.  Light touch is intact bilateral, Muscle strength normal.  MUSCULOSKELETAL: acceptable muscle strength, tone and stability bilateral.  Intrinsic muscluature intact bilateral.  Rectus appearance of foot and digits noted bilateral.   DERMATOLOGIC: skin color, texture, and turgor are within normal limits.  No preulcerative lesions or ulcers  are seen, no interdigital maceration noted.  No open lesions present.  . No drainage noted.  NAILS  Thick disfigured discolored nails 2-5 B/L       Assessment & Plan:  Onychomycosis B/L   Diabetes with no complications.  IE  Debridement of nails B/L  RTC 3 months   Gardiner Barefoot DPM

## 2016-02-11 ENCOUNTER — Emergency Department (HOSPITAL_COMMUNITY)
Admission: EM | Admit: 2016-02-11 | Discharge: 2016-02-11 | Disposition: A | Discharge status: Home / Self Care | Payer: Medicare Other | Attending: Emergency Medicine | Admitting: Emergency Medicine

## 2016-02-11 ENCOUNTER — Emergency Department (HOSPITAL_COMMUNITY): Payer: Medicare Other

## 2016-02-11 ENCOUNTER — Encounter (HOSPITAL_COMMUNITY): Payer: Self-pay | Admitting: Cardiology

## 2016-02-11 DIAGNOSIS — Q6100 Congenital renal cyst, unspecified: Secondary | ICD-10-CM | POA: Insufficient documentation

## 2016-02-11 DIAGNOSIS — Z9981 Dependence on supplemental oxygen: Secondary | ICD-10-CM | POA: Insufficient documentation

## 2016-02-11 DIAGNOSIS — R05 Cough: Secondary | ICD-10-CM | POA: Diagnosis present

## 2016-02-11 DIAGNOSIS — Z7984 Long term (current) use of oral hypoglycemic drugs: Secondary | ICD-10-CM | POA: Diagnosis not present

## 2016-02-11 DIAGNOSIS — H409 Unspecified glaucoma: Secondary | ICD-10-CM | POA: Insufficient documentation

## 2016-02-11 DIAGNOSIS — Z8739 Personal history of other diseases of the musculoskeletal system and connective tissue: Secondary | ICD-10-CM | POA: Diagnosis not present

## 2016-02-11 DIAGNOSIS — Z79899 Other long term (current) drug therapy: Secondary | ICD-10-CM | POA: Diagnosis not present

## 2016-02-11 DIAGNOSIS — F259 Schizoaffective disorder, unspecified: Secondary | ICD-10-CM | POA: Insufficient documentation

## 2016-02-11 DIAGNOSIS — Z87891 Personal history of nicotine dependence: Secondary | ICD-10-CM | POA: Diagnosis not present

## 2016-02-11 DIAGNOSIS — I10 Essential (primary) hypertension: Secondary | ICD-10-CM | POA: Insufficient documentation

## 2016-02-11 DIAGNOSIS — Z86018 Personal history of other benign neoplasm: Secondary | ICD-10-CM | POA: Diagnosis not present

## 2016-02-11 DIAGNOSIS — E669 Obesity, unspecified: Secondary | ICD-10-CM | POA: Insufficient documentation

## 2016-02-11 DIAGNOSIS — G8929 Other chronic pain: Secondary | ICD-10-CM | POA: Diagnosis not present

## 2016-02-11 DIAGNOSIS — J069 Acute upper respiratory infection, unspecified: Secondary | ICD-10-CM

## 2016-02-11 DIAGNOSIS — R0902 Hypoxemia: Secondary | ICD-10-CM

## 2016-02-11 DIAGNOSIS — F319 Bipolar disorder, unspecified: Secondary | ICD-10-CM | POA: Diagnosis not present

## 2016-02-11 DIAGNOSIS — Z7951 Long term (current) use of inhaled steroids: Secondary | ICD-10-CM | POA: Diagnosis not present

## 2016-02-11 DIAGNOSIS — E119 Type 2 diabetes mellitus without complications: Secondary | ICD-10-CM | POA: Insufficient documentation

## 2016-02-11 DIAGNOSIS — E785 Hyperlipidemia, unspecified: Secondary | ICD-10-CM | POA: Diagnosis not present

## 2016-02-11 LAB — BASIC METABOLIC PANEL
ANION GAP: 12 (ref 5–15)
BUN: 18 mg/dL (ref 6–20)
CO2: 21 mmol/L — ABNORMAL LOW (ref 22–32)
Calcium: 9.9 mg/dL (ref 8.9–10.3)
Chloride: 107 mmol/L (ref 101–111)
Creatinine, Ser: 1.17 mg/dL — ABNORMAL HIGH (ref 0.44–1.00)
GFR calc Af Amer: 55 mL/min — ABNORMAL LOW (ref >60)
GFR, EST NON AFRICAN AMERICAN: 48 mL/min — AB (ref >60)
Glucose, Bld: 201 mg/dL — ABNORMAL HIGH (ref 65–99)
POTASSIUM: 5.2 mmol/L — AB (ref 3.5–5.1)
SODIUM: 140 mmol/L (ref 135–145)

## 2016-02-11 LAB — D-DIMER, QUANTITATIVE (NOT AT ARMC): D DIMER QUANT: 1.94 ug{FEU}/mL — AB (ref 0.00–0.50)

## 2016-02-11 LAB — CBC
HEMATOCRIT: 35.9 % — AB (ref 36.0–46.0)
HEMOGLOBIN: 11.3 g/dL — AB (ref 12.0–15.0)
MCH: 25.2 pg — ABNORMAL LOW (ref 26.0–34.0)
MCHC: 31.5 g/dL (ref 30.0–36.0)
MCV: 80.1 fL (ref 78.0–100.0)
Platelets: 347 10*3/uL (ref 150–400)
RBC: 4.48 MIL/uL (ref 3.87–5.11)
RDW: 16.6 % — ABNORMAL HIGH (ref 11.5–15.5)
WBC: 8.1 10*3/uL (ref 4.0–10.5)

## 2016-02-11 LAB — I-STAT TROPONIN, ED: Troponin i, poc: 0.01 ng/mL (ref 0.00–0.08)

## 2016-02-11 LAB — CBG MONITORING, ED: Glucose-Capillary: 132 mg/dL — ABNORMAL HIGH (ref 65–99)

## 2016-02-11 MED ORDER — PREDNISONE 20 MG PO TABS: 60.0000 mg | ORAL_TABLET | Freq: Every day | ORAL | Status: DC

## 2016-02-11 MED ORDER — IOHEXOL 350 MG/ML SOLN
100.0000 mL | Freq: Once | INTRAVENOUS | Status: AC | PRN
  Administered 2016-02-11: 100 mL via INTRAVENOUS

## 2016-02-11 MED ORDER — SODIUM CHLORIDE 0.9 % IV BOLUS (SEPSIS)
500.0000 mL | Freq: Once | INTRAVENOUS | Status: AC
  Administered 2016-02-11: 500 mL via INTRAVENOUS

## 2016-02-11 MED ORDER — DEXAMETHASONE SODIUM PHOSPHATE 10 MG/ML IJ SOLN
10.0000 mg | Freq: Once | INTRAMUSCULAR | Status: AC
  Administered 2016-02-11: 10 mg via INTRAMUSCULAR
  Filled 2016-02-11: qty 1

## 2016-02-11 MED ORDER — IPRATROPIUM-ALBUTEROL 0.5-2.5 (3) MG/3ML IN SOLN
3.0000 mL | Freq: Once | RESPIRATORY_TRACT | Status: AC
  Administered 2016-02-11: 3 mL via RESPIRATORY_TRACT
  Filled 2016-02-11: qty 3

## 2016-02-11 NOTE — ED Provider Notes (Signed)
CSN: [id number]     Arrival date & time 02/11/16  1007 History   First MD Initiated Contact with Patient 02/11/16 1040     Chief Complaint  Patient presents with  . Cough  . Nasal Congestion   HPI Elizabeth Thompson is a 65 y.o. female PMH significant for hypertension, hyperlipidemia, diabetes presenting with a 3 day history of productive cough, sore throat, nasal congestion. Her niece, present at bedside, states that she normally wears 2 L of oxygen at home at night but has been requiring more oxygen throughout the day. She states that she has been diagnosed with COPD recently and has been on breathing treatments and a rescue inhaler. She states that they have had to use more breathing treatments and the rescue inhaler since the patient's symptoms have began. She denies fevers, chills, chest pain, abdominal pain, nausea, vomiting, ill contacts.  Past Medical History  Diagnosis Date  . Hypertension   . Bell's palsy   . Glaucoma   . Schizo-affective psychosis ([place])   . Memory disorder 09/05/2014  . Uterine fibroid   . Hypercalcemia   . Hyperlipidemia   . Back pain   . Chronic low back pain 05/09/2015  . Bronchitis   . Bipolar disorder ([place])   . Frequency of urination   . Cyst of right kidney   . GERD (gastroesophageal reflux disease)   . Arthritis   . Diabetes mellitus without complication ([place])     type 2   Past Surgical History  Procedure Laterality Date  . Abdominal hysterectomy    . Tonsillectomy and adenoidectomy    . Appendectomy    . Colonoscopy w/ polypectomy    . Lumbar laminectomy/decompression microdiscectomy N/A 08/14/2015    Procedure: LUMBAR DECOMPRESSION MICRODISCECTOMY L3-S1;  Surgeon: Melina Schools, MD;  Location: [facility];  Service: Orthopedics;  Laterality: N/A;   Family History  Problem Relation Age of Onset  . Dementia Mother   . Alzheimer's disease Mother   . Heart disease Mother   . Hypertension Mother   . Diabetes Mother   . Diabetes Father   .  Alzheimer's disease Sister   . Heart disease Brother   . Heart attack Brother    Social History  Substance Use Topics  . Smoking status: Former Smoker -- 1.00 packs/day for 28 years    Types: Cigarettes    Quit date: 07/15/2015  . Smokeless tobacco: Never Used  . Alcohol Use: No   OB History    No data available     Review of Systems  Ten systems are reviewed and are negative for acute change except as noted in the HPI  Allergies  Codeine and Prednisone  Home Medications   Prior to Admission medications   Medication Sig Start Date End Date Taking? Authorizing Provider  albuterol (PROVENTIL HFA;VENTOLIN HFA) 108 (90 BASE) MCG/ACT inhaler Inhale 2 puffs into the lungs every 6 (six) hours as needed for wheezing or shortness of breath. 08/27/14   Kerrie Buffalo, NP  albuterol (PROVENTIL) (2.5 MG/3ML) 0.083% nebulizer solution Take 2.5 mg by nebulization every 6 (six) hours as needed for wheezing or shortness of breath.    Historical Provider, MD  ARIPiprazole (ABILIFY) 20 MG tablet Take 1 tablet (20 mg total) by mouth at bedtime. 08/27/14   Kerrie Buffalo, NP  budesonide-formoterol [facility]) 160-4.5 MCG/ACT inhaler Inhale 2 puffs into the lungs 2 (two) times daily.    Historical Provider, MD  Cholecalciferol (VITAMIN D3 PO) Take 1 tablet by mouth  daily.    Historical Provider, MD  cyclobenzaprine (FLEXERIL) 10 MG tablet Take 10 mg by mouth 3 (three) times daily as needed for muscle spasms.    Historical Provider, MD  donepezil (ARICEPT) 5 MG tablet Take 1 tablet (5 mg total) by mouth at bedtime. 08/27/14   Kerrie Buffalo, NP  glimepiride (AMARYL) 4 MG tablet Take 1 tablet (4 mg total) by mouth daily with breakfast. 08/27/14   Kerrie Buffalo, NP  hydrOXYzine (ATARAX/VISTARIL) 50 MG tablet Take 50 mg by mouth at bedtime.    Historical Provider, MD  Ipratropium-Albuterol (COMBIVENT) 20-100 MCG/ACT AERS respimat Inhale 1 puff into the lungs every 6 (six) hours.    Historical Provider, MD   irbesartan (AVAPRO) 300 MG tablet Take 1 tablet (300 mg total) by mouth daily. Patient taking differently: Take 150 mg by mouth daily.  12/05/15   Tanda Rockers, MD  lamoTRIgine (LAMICTAL) 100 MG tablet Take 1 tablet (100 mg total) by mouth every evening. 08/27/14   Kerrie Buffalo, NP  latanoprost (XALATAN) 0.005 % ophthalmic solution Place 1 drop into both eyes at bedtime. 08/27/14   Kerrie Buffalo, NP  metFORMIN (GLUCOPHAGE) 1000 MG tablet Take 1 tablet (1,000 mg total) by mouth 2 (two) times daily with a meal. 08/27/14   Kerrie Buffalo, NP  methocarbamol (ROBAXIN) 500 MG tablet Take 1 tablet (500 mg total) by mouth 3 (three) times daily as needed for muscle spasms. 08/14/15   Melina Schools, MD  mirtazapine (REMERON) 7.5 MG tablet Take 1 tablet (7.5 mg total) by mouth at bedtime. 08/27/14   Kerrie Buffalo, NP  Multiple Vitamin (MULTIVITAMIN) capsule Take 1 capsule by mouth daily.    Historical Provider, MD  ondansetron (ZOFRAN) 4 MG tablet Take 1 tablet (4 mg total) by mouth every 8 (eight) hours as needed for nausea or vomiting. 08/14/15   Melina Schools, MD  OXYGEN Inhale 2 L into the lungs at bedtime.     Historical Provider, MD  Selenium Sulf-Pyrithione-Urea 2.25 % SHAM Apply 1 application topically as needed. Applied to scalp for dermatitis 01/25/15   Historical Provider, MD  simvastatin (ZOCOR) 40 MG tablet Take 1 tablet (40 mg total) by mouth daily. Patient taking differently: Take 40 mg by mouth every evening.  08/27/14   Kerrie Buffalo, NP   BP 137/67 mmHg  Pulse 97  Temp(Src) 99 F (37.2 C) (Oral)  Resp 21  SpO2 94% Physical Exam  Constitutional: She appears well-developed and well-nourished. No distress.  Obese  HENT:  Head: Normocephalic and atraumatic.  Right Ear: External ear normal.  Left Ear: External ear normal.  Nose: Nose normal.  Mouth/Throat: Oropharynx is clear and moist. No oropharyngeal exudate.  BL TMs without bulging, erythema.  Eyes: Conjunctivae are normal.  Pupils are equal, round, and reactive to light. Right eye exhibits no discharge. Left eye exhibits no discharge. No scleral icterus.  Neck: No tracheal deviation present.  Cardiovascular: Normal rate, regular rhythm, normal heart sounds and intact distal pulses.  Exam reveals no gallop and no friction rub.   No murmur heard. Pulmonary/Chest: Effort normal. No respiratory distress. She has wheezes. She has no rales. She exhibits no tenderness.  End expiratory wheezes bilaterally  Abdominal: Soft. Bowel sounds are normal. She exhibits no distension and no mass. There is no tenderness. There is no rebound and no guarding.  Musculoskeletal: She exhibits no edema.  Lymphadenopathy:    She has no cervical adenopathy.  Neurological: She is alert. Coordination normal.  Skin: Skin is  warm and dry. No rash noted. She is not diaphoretic. No erythema.  Psychiatric: She has a normal mood and affect. Her behavior is normal.  Nursing note and vitals reviewed.   ED Course  Procedures  Labs Review Labs Reviewed  BASIC METABOLIC PANEL - Abnormal; Notable for the following:    Potassium 5.2 (*)    CO2 21 (*)    Glucose, Bld 201 (*)    Creatinine, Ser 1.17 (*)    GFR calc non Af Amer 48 (*)    GFR calc Af Amer 55 (*)    All other components within normal limits  CBC - Abnormal; Notable for the following:    Hemoglobin 11.3 (*)    HCT 35.9 (*)    MCH 25.2 (*)    RDW 16.6 (*)    All other components within normal limits  D-DIMER, QUANTITATIVE (NOT AT [facility])  Randolm Idol, ED    Imaging Review Dg Chest 2 View  02/11/2016  CLINICAL DATA:  Cough and congestion for 3 days EXAM: CHEST  2 VIEW COMPARISON:  11/06/2015 FINDINGS: Cardiac shadow is within normal limits. The lungs are well aerated bilaterally. No focal infiltrate or sizable effusion is seen. No bony abnormality is noted. IMPRESSION: No active cardiopulmonary disease. Electronically Signed   By: Inez Catalina M.D.   On: 02/11/2016 10:47    Ct Angio Chest Pe W/cm &/or Wo Cm  02/11/2016  CLINICAL DATA:  Shortness of breath, progressive for 3 days EXAM: CT ANGIOGRAPHY CHEST WITH CONTRAST TECHNIQUE: Multidetector CT imaging of the chest was performed using the standard protocol during bolus administration of intravenous contrast. Multiplanar CT image reconstructions and MIPs were obtained to evaluate the vascular anatomy. CONTRAST:  110m OMNIPAQUE IOHEXOL 350 MG/ML SOLN COMPARISON:  Chest CT November 06, 2015; chest radiograph February 11, 2016 FINDINGS: Mediastinum/Lymph Nodes: There is no demonstrable pulmonary embolus. There is no thoracic aortic aneurysm or dissection. The visualize great vessels appear unremarkable except for minimal atherosclerotic calcification in the proximal great vessels. There arm scattered foci of calcification in the thoracic aorta. The pericardium is not appreciably thickened. There are scattered foci of coronary artery calcification. Thyroid appears unremarkable. There are multiple enlarged lymph nodes as was noted three months prior. Prominent lymph nodes in the right axillary region are less well visualized at this time due to differences in scan parameters. There is a pretracheal lymph node just to the right of midline which measures 1.8 x 1.6 cm, essentially stable allowing for slight differences in positioning. There is a lymph node anterior to the carina measuring 2.8 x 2.1 cm, larger compared to the previous study. There is a lymph node to the left of the carina measuring 2.1 x 2.0 cm, essentially stable. There is a lymph node in the left hilar region measuring 1.6 x 1.6 cm, stable. A second lymph node in the left hilar region measures 1.6 x 1.4 cm, stable. There is a sub- carinal lymph node measuring 2.0 x 1.7 cm, stable. There are lymph nodes adjacent to the aortic arch, essentially stable. There are scattered subcentimeter mediastinal lymph nodes as well. Lungs/Pleura: Underlying emphysematous changes again  noted. Generalized interstitial prominence is essentially stable and is likely due to chronic fibrotic type change. There is no well-defined edema or consolidation. There is a stable 4 mm nodular opacity in the right upper lobe peripherally, seen on axial slice 27 series 6. No new parenchymal lung nodular lesions are apparent. Upper abdomen: In the visualized upper abdomen, there  is bilateral adrenal hypertrophy. There is a mass arising from the upper pole of the left kidney measuring 2.3 x 2.0 cm as well as a second mass on the left medially measuring 1.7 x 1.4 cm. There are also renal cysts noted on the right. Musculoskeletal: There are no blastic or lytic bone lesions. Degenerative changes noted in the thoracic spine. Review of the MIP images confirms the above findings. IMPRESSION: No demonstrable pulmonary embolus. Adenopathy is noted at multiple sites in the thoracic region. Adenopathy appears very little changed compared to 3 months prior. Etiology uncertain. A neoplastic etiology must be of concern. There are mass lesions arising from the upper pole the left kidney which have attenuation values higher than is expected with cysts. Renal neoplasm cannot be excluded on the left. Advise pre and post-contrast renal MR or CT to further evaluate ; MR would be the optimal imaging of choice in this circumstance. There is underlying emphysema with fibrotic type change in the lungs. No frank consolidation or edema noted. Electronically Signed   By: Lowella Grip III M.D.   On: 02/11/2016 13:45    I have personally reviewed and evaluated these images and lab results as part of my medical decision-making.   EKG Interpretation   Date/Time:  Tuesday February 11 2016 11:30:07 EST Ventricular Rate:  92 PR Interval:  170 QRS Duration: 90 QT Interval:  383 QTC Calculation: 474 R Axis:   95 Text Interpretation:  Sinus rhythm Right axis deviation ED PHYSICIAN  INTERPRETATION AVAILABLE IN [facility]  Confirmed by TEST, Record  ([id number]) on 02/12/2016 7:26:46 AM      MDM   Final diagnoses:  Hypoxia  Upper respiratory infection   Most likely etiologies are URI vs PE vs COPD exacerbation. Less likely ACS.  Chest x-ray unremarkable for acute change. CBC unremarkable. BMP with hyperkalemia of 5.2, hyperglycemia of 201, serum creatinine of 1.17; however, this is baseline. EKG with right axis deviation, otherwise unremarkable. Negative troponin. D-dimer elevated at 1.94. Ordered CTA chest.  CTA chest unremarkable for PE. Adenopathy is noted at multiple sites in the thoracic region. Adenopathy appears very little changed compared to 3 months prior. Etiology uncertain. A neoplastic etiology must be of concern. There are mass lesions arising from the upper pole the left kidney which have attenuation values higher than is expected with cysts. Renal neoplasm cannot be excluded on the left. Advise pre and post-contrast renal MR or CT to further evaluate ; MR would be the optimal imaging of choice in this circumstance. There is underlying emphysema with fibrotic type change in the lungs. No frank consolidation or edema noted. Discussed results with patient.  Patient not hypoxic on exam or ambulation. Wheezing improved after meds.  Patient is to be discharged with recommendation to follow up with PCP in regards to today's hospital visit. Pt has been advised to return to the ED if SOB becomes exertional, associated with diaphoresis, CP, or nausea, radiates to left jaw/arm, worsens or becomes concerning in any way. Pt appears reliable for follow up and is agreeable to discharge.  Discussed case with Dr. Jeneen Rinks who agrees with plan.   [place] Lions, PA-C 02/14/16 2211  Tanna Furry, MD 02/24/16 [phone]

## 2016-02-11 NOTE — ED Notes (Signed)
Pt reports a cough, sore throat, and congestion over the past couple of days. States she has been getting worse since then. Also reports that she normally wears O2 at night but has been requiring it during the day.

## 2016-02-11 NOTE — Discharge Instructions (Signed)
Ms. LATAUSHA FLAMM,  Nice meeting you! Please follow-up with your primary care provider and oncology within one week. Return to the emergency department if you develop chest pain, increased shortness of breath. Feel better soon!  S. Wendie Simmer, PA-C

## 2016-02-20 ENCOUNTER — Ambulatory Visit: Payer: Self-pay | Admitting: Internal Medicine

## 2016-03-02 ENCOUNTER — Ambulatory Visit: Payer: Self-pay | Admitting: Internal Medicine

## 2016-03-06 ENCOUNTER — Ambulatory Visit: Payer: Self-pay | Admitting: Internal Medicine

## 2016-03-19 ENCOUNTER — Ambulatory Visit (INDEPENDENT_AMBULATORY_CARE_PROVIDER_SITE_OTHER): Payer: Medicare Other | Admitting: Internal Medicine

## 2016-03-19 ENCOUNTER — Encounter: Payer: Self-pay | Admitting: Internal Medicine

## 2016-03-19 VITALS — BP 150/98 | HR 88 | Temp 98.3°F | Wt 191.0 lb

## 2016-03-19 DIAGNOSIS — G4700 Insomnia, unspecified: Secondary | ICD-10-CM

## 2016-03-19 DIAGNOSIS — H2513 Age-related nuclear cataract, bilateral: Secondary | ICD-10-CM | POA: Diagnosis not present

## 2016-03-19 DIAGNOSIS — E119 Type 2 diabetes mellitus without complications: Secondary | ICD-10-CM

## 2016-03-19 DIAGNOSIS — I10 Essential (primary) hypertension: Secondary | ICD-10-CM

## 2016-03-19 DIAGNOSIS — M545 Low back pain, unspecified: Secondary | ICD-10-CM

## 2016-03-19 DIAGNOSIS — G8929 Other chronic pain: Secondary | ICD-10-CM

## 2016-03-19 DIAGNOSIS — R413 Other amnesia: Secondary | ICD-10-CM

## 2016-03-19 DIAGNOSIS — J449 Chronic obstructive pulmonary disease, unspecified: Secondary | ICD-10-CM | POA: Diagnosis not present

## 2016-03-19 DIAGNOSIS — E785 Hyperlipidemia, unspecified: Secondary | ICD-10-CM

## 2016-03-19 DIAGNOSIS — H401132 Primary open-angle glaucoma, bilateral, moderate stage: Secondary | ICD-10-CM | POA: Diagnosis not present

## 2016-03-19 DIAGNOSIS — G4710 Hypersomnia, unspecified: Secondary | ICD-10-CM

## 2016-03-19 DIAGNOSIS — G4730 Sleep apnea, unspecified: Secondary | ICD-10-CM

## 2016-03-19 DIAGNOSIS — K219 Gastro-esophageal reflux disease without esophagitis: Secondary | ICD-10-CM

## 2016-03-19 DIAGNOSIS — R269 Unspecified abnormalities of gait and mobility: Secondary | ICD-10-CM

## 2016-03-19 DIAGNOSIS — F250 Schizoaffective disorder, bipolar type: Secondary | ICD-10-CM

## 2016-03-19 LAB — COMPREHENSIVE METABOLIC PANEL
ALBUMIN: 4.4 g/dL (ref 3.5–5.2)
ALK PHOS: 75 U/L (ref 39–117)
ALT: 12 U/L (ref 0–35)
AST: 14 U/L (ref 0–37)
BILIRUBIN TOTAL: 0.3 mg/dL (ref 0.2–1.2)
BUN: 23 mg/dL (ref 6–23)
CALCIUM: 10.5 mg/dL (ref 8.4–10.5)
CO2: 25 mEq/L (ref 19–32)
Chloride: 107 mEq/L (ref 96–112)
Creatinine, Ser: 1.41 mg/dL — ABNORMAL HIGH (ref 0.40–1.20)
GFR: 48.05 mL/min — AB (ref >60.00)
GLUCOSE: 156 mg/dL — AB (ref 70–99)
Potassium: 4.7 mEq/L (ref 3.5–5.1)
Sodium: 138 mEq/L (ref 135–145)
TOTAL PROTEIN: 7.7 g/dL (ref 6.0–8.3)

## 2016-03-19 LAB — LIPID PANEL
CHOLESTEROL: 245 mg/dL — AB (ref 0–200)
HDL: 61.6 mg/dL (ref >39.00)
NonHDL: 183.85
TRIGLYCERIDES: 234 mg/dL — AB (ref 0.0–149.0)
Total CHOL/HDL Ratio: 4
VLDL: 46.8 mg/dL — ABNORMAL HIGH (ref 0.0–40.0)

## 2016-03-19 LAB — CBC
HCT: 35.9 % — ABNORMAL LOW (ref 36.0–46.0)
HEMOGLOBIN: 11.6 g/dL — AB (ref 12.0–15.0)
MCHC: 32.4 g/dL (ref 30.0–36.0)
MCV: 77.5 fl — ABNORMAL LOW (ref 78.0–100.0)
PLATELETS: 344 10*3/uL (ref 150.0–400.0)
RBC: 4.63 Mil/uL (ref 3.87–5.11)
RDW: 17 % — ABNORMAL HIGH (ref 11.5–15.5)
WBC: 7.3 10*3/uL (ref 4.0–10.5)

## 2016-03-19 LAB — LDL CHOLESTEROL, DIRECT: Direct LDL: 142 mg/dL

## 2016-03-19 LAB — HEMOGLOBIN A1C: HEMOGLOBIN A1C: 8.7 % — AB (ref 4.6–6.5)

## 2016-03-19 LAB — HM DIABETES EYE EXAM

## 2016-03-19 NOTE — Progress Notes (Signed)
Pre visit review using our clinic review tool, if applicable. No additional management support is needed unless otherwise documented below in the visit note.

## 2016-03-19 NOTE — Progress Notes (Signed)
HPI  Pt presents to the clinic today to establish care. She is transferring care from Dr. Altha Harm.  HTN: Her BP today is 150/98 but she reports she has not taken her Irbesartan today. She denies chest pain or shortness of breath.  Gait disorder secondary to chronic back pain: She walks with a rollator. She takes Ibuprofen daily and Robaxin as needed.   COPD: She is no longer smoking. She takes her Combivent and Symbicort as prescribed. She only has to use her Albuterol 2 x week.  DM2: Her sugars range 94-160. She takes Amaryl and Metformin as prescribed. She does frequently have loose stool. She is getting her eye exam today. She does not take flu shots. Prevnar 2015. Pneumovax never. She follows with Dr. Cruzita Lederer.  HLD: She denies myalgias on Zocor. She tries to consume a low fat diet. She does not take baby ASA due to historly of bleeding ulcer.  Hypercalcemia: She is due to have her calcium checked. She follows with Dr. Cruzita Lederer.  OSA: She does not wear a CPAP. She does wear 2L of oxygen at night and when taking naps. She follows with Dr. Melvyn Novas.  Memory Impairment: She denies this but is taking Aricept daily. She follows with Dr. Jannifer Franklin.  Schizophrenia: Managed by Ms. Jones at [facility]. She takes Lamictal, Hydroxyzine and Abilify. She denies SI/HI.  Insomnia: Sleeping well on Mirtazapine.  GERD: She denies breakthrough symptoms on Nexium  Flu: never Tetanus: unsure Prevnar: 2015 Pneumovax: never Zostovax: never Pap Smear: Hysterectomy Mammogram: 04/2014 at the [facility] Colon: 2016 Dr. Collene Mares Vision Screening: yearly Dentist: as needed, dentures  Past Medical History  Diagnosis Date  . Hypertension   . Bell's palsy   . Glaucoma   . Schizo-affective psychosis ([place])   . Memory disorder 09/05/2014  . Uterine fibroid   . Hypercalcemia   . Hyperlipidemia   . Back pain   . Chronic low back pain 05/09/2015  . Bronchitis   . Bipolar disorder ([place])   . Frequency of  urination   . Cyst of right kidney   . GERD (gastroesophageal reflux disease)   . Arthritis   . Diabetes mellitus without complication ([place])     type 2  . History of colon polyps     Current Outpatient Prescriptions  Medication Sig Dispense Refill  . albuterol (PROVENTIL HFA;VENTOLIN HFA) 108 (90 BASE) MCG/ACT inhaler Inhale 2 puffs into the lungs every 6 (six) hours as needed for wheezing or shortness of breath.    [name] albuterol (PROVENTIL) (2.5 MG/3ML) 0.083% nebulizer solution Take 2.5 mg by nebulization every 6 (six) hours as needed for wheezing or shortness of breath.    . ARIPiprazole (ABILIFY) 20 MG tablet Take 1 tablet (20 mg total) by mouth at bedtime. 30 tablet 0  . budesonide-formoterol (SYMBICORT) 160-4.5 MCG/ACT inhaler Inhale 2 puffs into the lungs 2 (two) times daily.    . Cholecalciferol (VITAMIN D3 PO) Take 1 tablet by mouth daily.    [name] donepezil (ARICEPT) 5 MG tablet Take 1 tablet (5 mg total) by mouth at bedtime. 30 tablet 0  . glimepiride (AMARYL) 4 MG tablet Take 1 tablet (4 mg total) by mouth daily with breakfast.    . hydrOXYzine (ATARAX/VISTARIL) 50 MG tablet Take 50 mg by mouth at bedtime.    . Ipratropium-Albuterol (COMBIVENT) 20-100 MCG/ACT AERS respimat Inhale 1 puff into the lungs every 6 (six) hours.    . irbesartan (AVAPRO) 300 MG tablet Take 1 tablet (300 mg total)  by mouth daily. 30 tablet 30  . lamoTRIgine (LAMICTAL) 100 MG tablet Take 1 tablet (100 mg total) by mouth every evening. 30 tablet 0  . latanoprost (XALATAN) 0.005 % ophthalmic solution Place 1 drop into both eyes at bedtime. 2.5 mL 12  . metFORMIN (GLUCOPHAGE) 1000 MG tablet Take 1 tablet (1,000 mg total) by mouth 2 (two) times daily with a meal.    . methocarbamol (ROBAXIN) 500 MG tablet Take 1 tablet (500 mg total) by mouth 3 (three) times daily as needed for muscle spasms. 60 tablet 0  . mirtazapine (REMERON) 7.5 MG tablet Take 1 tablet (7.5 mg total) by mouth at bedtime. 30 tablet 0  . Multiple  Vitamin (MULTIVITAMIN) capsule Take 1 capsule by mouth daily.    . ondansetron (ZOFRAN) 4 MG tablet Take 1 tablet (4 mg total) by mouth every 8 (eight) hours as needed for nausea or vomiting. 20 tablet 0  . OXYGEN Inhale 2 L into the lungs at bedtime.     . Selenium Sulf-Pyrithione-Urea 2.25 % SHAM Apply 1 application topically as needed. Applied to scalp for dermatitis  3  . simvastatin (ZOCOR) 40 MG tablet Take 1 tablet (40 mg total) by mouth daily. (Patient taking differently: Take 40 mg by mouth every evening. ) 30 tablet   . esomeprazole (NEXIUM) 20 MG capsule Take 20 mg by mouth daily at 12 noon. Reported on 03/19/2016     No current facility-administered medications for this visit.    Allergies  Allergen Reactions  . Codeine Nausea And Vomiting  . Prednisone Other (See Comments)    Has to monitor because she is a diabetic     Family History  Problem Relation Age of Onset  . Dementia Mother   . Alzheimer's disease Mother   . Heart disease Mother   . Hypertension Mother   . Diabetes Mother   . Diabetes Father   . Alzheimer's disease Sister   . Heart disease Brother   . Heart attack Brother     Social History   Social History  . Marital Status: Divorced    Spouse Name: N/A  . Number of Children: 1  . Years of Education: college 4   Occupational History  . disabled    Social History Main Topics  . Smoking status: Former Smoker -- 1.00 packs/day for 28 years    Types: Cigarettes    Quit date: 07/15/2015  . Smokeless tobacco: Never Used  . Alcohol Use: No  . Drug Use: No  . Sexual Activity: No   Other Topics Concern  . Not on file   Social History Narrative   Patient is right handed.   Patient drinks 2 cups caffeine daily.    ROS:  Constitutional:  Pt reports fatigue. Denies fever, malaise, headache or abrupt weight changes.  HEENT: Denies eye pain, eye redness, ear pain, ringing in the ears, wax buildup, runny nose, nasal congestion, bloody nose, or sore  throat. Respiratory: Pt reports shortness of breath. Denies difficulty breathing, cough or sputum production.   Cardiovascular: Pt reports swelling in her feet. Denies chest pain, chest tightness, palpitations or swelling in the hands.  Gastrointestinal: Pt reports loose stool. Denies abdominal pain, bloating, constipation, or blood in the stool.  GU: Denies frequency, urgency, pain with urination, blood in urine, odor or discharge. Musculoskeletal: Pt reports back pain and difficulty with gait. Denies decrease in range of motion or joint swelling.  Skin: Denies redness, rashes, lesions or ulcercations.  Neurological: Pt  reports difficulty with memory. Denies dizziness, difficulty with speech or problems with balance and coordination.  Psych: Denies anxiety, depression, SI/HI.  No other specific complaints in a complete review of systems (except as listed in HPI above).  PE:  BP 150/98 mmHg  Pulse 88  Temp(Src) 98.3 F (36.8 C) (Oral)  Wt 191 lb (86.637 kg)  SpO2 94% Wt Readings from Last 3 Encounters:  03/19/16 191 lb (86.637 kg)  01/17/16 198 lb (89.812 kg)  12/05/15 202 lb (91.627 kg)    General: Appears her stated age, obese in NAD. HEENT: Head: normal shape and size; Eyes: sclera white, no icterus, conjunctiva pink, PERRLA and EOMs intact;  Cardiovascular: Normal rate and rhythm. S1,S2 noted.  No murmur, rubs or gallops noted. Trace BLE edema.  Pulmonary/Chest: Normal effort and positive vesicular breath sounds. No respiratory distress. No wheezes, rales or ronchi noted.  Abdomen: Soft and nontender. Normal bowel sounds. Musculoskeletal: Gait slow but steady. Using rolling water for assistance. Neurological: Alert. Oriented to person and place. Psychiatric: Mood and affect flat.  BMET    Component Value Date/Time   NA 140 02/11/2016 1046   NA 142 09/05/2014 1459   K 5.2* 02/11/2016 1046   CL 107 02/11/2016 1046   CO2 21* 02/11/2016 1046   GLUCOSE 201* 02/11/2016 1046    GLUCOSE 118* 09/05/2014 1459   BUN 18 02/11/2016 1046   BUN 9 09/05/2014 1459   CREATININE 1.17* 02/11/2016 1046   CALCIUM 9.9 02/11/2016 1046   GFRNONAA 48* 02/11/2016 1046   GFRAA 55* 02/11/2016 1046    Lipid Panel  No results found for: CHOL, TRIG, HDL, CHOLHDL, VLDL, LDLCALC  CBC    Component Value Date/Time   WBC 8.1 02/11/2016 1046   RBC 4.48 02/11/2016 1046   HGB 11.3* 02/11/2016 1046   HCT 35.9* 02/11/2016 1046   PLT 347 02/11/2016 1046   MCV 80.1 02/11/2016 1046   MCH 25.2* 02/11/2016 1046   MCHC 31.5 02/11/2016 1046   RDW 16.6* 02/11/2016 1046   LYMPHSABS 2.7 11/07/2015 0230   MONOABS 0.5 11/07/2015 0230   EOSABS 0.3 11/07/2015 0230   BASOSABS 0.0 11/07/2015 0230    Hgb A1C Lab Results  Component Value Date   HGBA1C 6.6* 08/20/2015     Assessment and Plan:

## 2016-03-22 DIAGNOSIS — G4700 Insomnia, unspecified: Secondary | ICD-10-CM | POA: Insufficient documentation

## 2016-03-22 DIAGNOSIS — K219 Gastro-esophageal reflux disease without esophagitis: Secondary | ICD-10-CM | POA: Insufficient documentation

## 2016-03-22 NOTE — Assessment & Plan Note (Signed)
She will continue Ibuprofen and Robaxin Encouraged back pain

## 2016-03-22 NOTE — Assessment & Plan Note (Addendum)
She does not wear CPAP Continue nightly O2 Encouraged weight loss

## 2016-03-22 NOTE — Assessment & Plan Note (Signed)
Will check A1C today No microalbumin secondary to ARB therapy Foot examtoday Eye exam today She declines flu and pneumovax Encouraged her to consume a low carb, low fat diet Continue Metformin and Amaryl unless directed otherwise

## 2016-03-22 NOTE — Assessment & Plan Note (Signed)
Continue Nexium

## 2016-03-22 NOTE — Assessment & Plan Note (Signed)
Continue Mirtazapine

## 2016-03-22 NOTE — Assessment & Plan Note (Addendum)
Will continue Irbesartan Will get ECG from previous provider CBC and CMET today

## 2016-03-22 NOTE — Patient Instructions (Signed)
Food Choices for Gastroesophageal Reflux Disease, Adult When you have gastroesophageal reflux disease (GERD), the foods you eat and your eating habits are very important. Choosing the right foods can help ease the discomfort of GERD. WHAT GENERAL GUIDELINES DO I NEED TO FOLLOW?  Choose fruits, vegetables, whole grains, low-fat dairy products, and low-fat meat, fish, and poultry.  Limit fats such as oils, salad dressings, butter, nuts, and avocado.  Keep a food diary to identify foods that cause symptoms.  Avoid foods that cause reflux. These may be different for different people.  Eat frequent small meals instead of three large meals each day.  Eat your meals slowly, in a relaxed setting.  Limit fried foods.  Cook foods using methods other than frying.  Avoid drinking alcohol.  Avoid drinking large amounts of liquids with your meals.  Avoid bending over or lying down until 2-3 hours after eating. WHAT FOODS ARE NOT RECOMMENDED? The following are some foods and drinks that may worsen your symptoms: Vegetables Tomatoes. Tomato juice. Tomato and spaghetti sauce. Chili peppers. Onion and garlic. Horseradish. Fruits Oranges, grapefruit, and lemon (fruit and juice). Meats High-fat meats, fish, and poultry. This includes hot dogs, ribs, ham, sausage, salami, and bacon. Dairy Whole milk and chocolate milk. Sour cream. Cream. Butter. Ice cream. Cream cheese.  Beverages Coffee and tea, with or without caffeine. Carbonated beverages or energy drinks. Condiments Hot sauce. Barbecue sauce.  Sweets/Desserts Chocolate and cocoa. Donuts. Peppermint and spearmint. Fats and Oils High-fat foods, including [place] fries and potato chips. Other Vinegar. Strong spices, such as black pepper, white pepper, red pepper, cayenne, curry powder, cloves, ginger, and chili powder. The items listed above may not be a complete list of foods and beverages to avoid. Contact your dietitian for more  information.   This information is not intended to replace advice given to you by your health care provider. Make sure you discuss any questions you have with your health care provider.   Document Released: 11/30/2005 Document Revised: 12/21/2014 Document Reviewed: 10/04/2013 Elsevier Interactive Patient Education [employer].

## 2016-03-22 NOTE — Assessment & Plan Note (Addendum)
She will continue Combivent and Symbicort Continue Albuterol prn She will continue oxygen 2L at night

## 2016-03-22 NOTE — Assessment & Plan Note (Signed)
Secondary to back pain Continue rollator

## 2016-03-22 NOTE — Assessment & Plan Note (Signed)
Continue Aricept Continue to follow with Dr. Jannifer Franklin

## 2016-03-22 NOTE — Assessment & Plan Note (Signed)
She will continue to follow with psych Continue Lamictal, Hydroxyzine and Abilify

## 2016-03-22 NOTE — Assessment & Plan Note (Signed)
Will check calcium level today She will continue to follow with Dr. Renne Crigler

## 2016-03-22 NOTE — Assessment & Plan Note (Signed)
CMET and Lipid Profile Encouraged her to consume a low fat diet Continue Zocor

## 2016-03-23 ENCOUNTER — Encounter: Payer: Self-pay | Admitting: Internal Medicine

## 2016-04-07 ENCOUNTER — Ambulatory Visit (INDEPENDENT_AMBULATORY_CARE_PROVIDER_SITE_OTHER): Payer: Medicare Other | Admitting: Internal Medicine

## 2016-04-07 ENCOUNTER — Encounter: Payer: Self-pay | Admitting: Internal Medicine

## 2016-04-07 VITALS — BP 146/84 | HR 92 | Temp 98.0°F | Wt 211.0 lb

## 2016-04-07 DIAGNOSIS — N181 Chronic kidney disease, stage 1: Secondary | ICD-10-CM | POA: Diagnosis not present

## 2016-04-07 DIAGNOSIS — E785 Hyperlipidemia, unspecified: Secondary | ICD-10-CM

## 2016-04-07 DIAGNOSIS — E1122 Type 2 diabetes mellitus with diabetic chronic kidney disease: Secondary | ICD-10-CM

## 2016-04-07 DIAGNOSIS — N2889 Other specified disorders of kidney and ureter: Secondary | ICD-10-CM | POA: Diagnosis not present

## 2016-04-07 NOTE — Patient Instructions (Signed)

## 2016-04-07 NOTE — Progress Notes (Signed)
Subjective:    Patient ID: Elizabeth Thompson, female    DOB: 1951-11-19, 65 y.o.   MRN: [id number]  HPI  Pt presents to the clinic today to follow up her lab results.  Her A1C was 8.7 %, up from 6.6%. She is taking Metformin and Amaryl daily as prescribed. I tried to put her on Januvia but she declined. She reports she would like to take 3 months to really work on low carb, low fat diet, and exercise to lose weight.  Her LDL was 142, Triglycerides 234.0. She reports she is taking her Zocor as prescribed. She has been trying to consume a low fat diet.  She is mostly concerned about her decreased kidney function, creatinine 1.41, GFR 48.05. She reports the last time she was at the ER, they told her she had a cyst on her kidney and she wanted to follow up on this.  CT chest from 02/11/16 showed:  IMPRESSION:  No demonstrable pulmonary embolus.  Adenopathy is noted at multiple sites in the thoracic region. Adenopathy appears very little changed compared to 3 months prior. Etiology uncertain. A neoplastic etiology must be of concern.  There are mass lesions arising from the upper pole the left kidney which have attenuation values higher than is expected with cysts. Renal neoplasm cannot be excluded on the left. Advise pre and post-contrast renal MR or CT to further evaluate ; MR would be the optimal imaging of choice in this circumstance.  There is underlying emphysema with fibrotic type change in the lungs. No frank consolidation or edema noted.    Review of Systems      Past Medical History  Diagnosis Date  . Hypertension   . Bell's palsy   . Glaucoma   . Schizo-affective psychosis ([place])   . Memory disorder 09/05/2014  . Uterine fibroid   . Hypercalcemia   . Hyperlipidemia   . Back pain   . Chronic low back pain 05/09/2015  . Bronchitis   . Bipolar disorder ([place])   . Frequency of urination   . Cyst of right kidney   . GERD (gastroesophageal reflux disease)   .  Arthritis   . Diabetes mellitus without complication ([place])     type 2  . History of colon polyps     Current Outpatient Prescriptions  Medication Sig Dispense Refill  . albuterol (PROVENTIL HFA;VENTOLIN HFA) 108 (90 BASE) MCG/ACT inhaler Inhale 2 puffs into the lungs every 6 (six) hours as needed for wheezing or shortness of breath.    [name] albuterol (PROVENTIL) (2.5 MG/3ML) 0.083% nebulizer solution Take 2.5 mg by nebulization every 6 (six) hours as needed for wheezing or shortness of breath.    . ARIPiprazole (ABILIFY) 20 MG tablet Take 1 tablet (20 mg total) by mouth at bedtime. 30 tablet 0  . budesonide-formoterol (SYMBICORT) 160-4.5 MCG/ACT inhaler Inhale 2 puffs into the lungs 2 (two) times daily.    . Cholecalciferol (VITAMIN D3 PO) Take 1 tablet by mouth daily.    [name] donepezil (ARICEPT) 5 MG tablet Take 1 tablet (5 mg total) by mouth at bedtime. 30 tablet 0  . esomeprazole (NEXIUM) 20 MG capsule Take 20 mg by mouth daily at 12 noon. Reported on 03/19/2016    . glimepiride (AMARYL) 4 MG tablet Take 1 tablet (4 mg total) by mouth daily with breakfast.    . hydrOXYzine (ATARAX/VISTARIL) 50 MG tablet Take 50 mg by mouth at bedtime.    . Ipratropium-Albuterol (COMBIVENT) 20-100 MCG/ACT AERS respimat  Inhale 1 puff into the lungs every 6 (six) hours.    . irbesartan (AVAPRO) 300 MG tablet Take 1 tablet (300 mg total) by mouth daily. 30 tablet 30  . lamoTRIgine (LAMICTAL) 100 MG tablet Take 1 tablet (100 mg total) by mouth every evening. 30 tablet 0  . latanoprost (XALATAN) 0.005 % ophthalmic solution Place 1 drop into both eyes at bedtime. 2.5 mL 12  . metFORMIN (GLUCOPHAGE) 1000 MG tablet Take 1 tablet (1,000 mg total) by mouth 2 (two) times daily with a meal.    . methocarbamol (ROBAXIN) 500 MG tablet Take 1 tablet (500 mg total) by mouth 3 (three) times daily as needed for muscle spasms. 60 tablet 0  . mirtazapine (REMERON) 7.5 MG tablet Take 1 tablet (7.5 mg total) by mouth at bedtime. 30  tablet 0  . Multiple Vitamin (MULTIVITAMIN) capsule Take 1 capsule by mouth daily.    . ondansetron (ZOFRAN) 4 MG tablet Take 1 tablet (4 mg total) by mouth every 8 (eight) hours as needed for nausea or vomiting. 20 tablet 0  . OXYGEN Inhale 2 L into the lungs at bedtime.     . Selenium Sulf-Pyrithione-Urea 2.25 % SHAM Apply 1 application topically as needed. Applied to scalp for dermatitis  3  . simvastatin (ZOCOR) 40 MG tablet Take 1 tablet (40 mg total) by mouth daily. (Patient taking differently: Take 40 mg by mouth every evening. ) 30 tablet    No current facility-administered medications for this visit.    Allergies  Allergen Reactions  . Codeine Nausea And Vomiting  . Prednisone Other (See Comments)    Has to monitor because she is a diabetic     Family History  Problem Relation Age of Onset  . Dementia Mother   . Alzheimer's disease Mother   . Heart disease Mother   . Hypertension Mother   . Diabetes Mother   . Diabetes Father   . Alzheimer's disease Sister   . Heart disease Brother   . Heart attack Brother     Social History   Social History  . Marital Status: Divorced    Spouse Name: N/A  . Number of Children: 1  . Years of Education: college 4   Occupational History  . disabled    Social History Main Topics  . Smoking status: Former Smoker -- 1.00 packs/day for 28 years    Types: Cigarettes    Quit date: 07/15/2015  . Smokeless tobacco: Never Used  . Alcohol Use: No  . Drug Use: No  . Sexual Activity: No   Other Topics Concern  . Not on file   Social History Narrative   Patient is right handed.   Patient drinks 2 cups caffeine daily.     Constitutional: Pt reports weight gain. Denies fever, malaise, fatigue, headache.  Respiratory: Denies difficulty breathing, shortness of breath, cough or sputum production.   Cardiovascular: Denies chest pain, chest tightness, palpitations or swelling in the hands or feet.  Skin: Denies redness, rashes,  lesions or ulcercations.  Neurological: Pt reports difficulty with memory. Denies dizziness, difficulty with speech or problems with balance and coordination.    No other specific complaints in a complete review of systems (except as listed in HPI above).  Objective:   Physical Exam   BP 146/84 mmHg  Pulse 92  Temp(Src) 98 F (36.7 C) (Oral)  Wt 211 lb (95.709 kg)  SpO2 97% Wt Readings from Last 3 Encounters:  04/07/16 211 lb (95.709 kg)  03/19/16 191 lb (86.637 kg)  01/17/16 198 lb (89.812 kg)    General: Appears her stated age, obese in NAD. HEENT: Head: normal shape and size; Eyes: sclera white, no icterus, conjunctiva pink, PERRLA and EOMs intact;   Cardiovascular: Normal rate and rhythm. S1,S2 noted.  No murmur, rubs or gallops noted. Trace BLE edema.   Pulmonary/Chest: Normal effort and positive vesicular breath sounds. No respiratory distress. No wheezes, rales or ronchi noted.  Abdomen: Soft and nontender. Normal bowel sounds. Musculoskeletal: Gait slow but steady. Using rolling water for assistance. Neurological: Alert. Oriented to person and place. Psychiatric: Mood and affect flat.   BMET    Component Value Date/Time   NA 138 03/19/2016 1129   NA 142 09/05/2014 1459   K 4.7 03/19/2016 1129   CL 107 03/19/2016 1129   CO2 25 03/19/2016 1129   GLUCOSE 156* 03/19/2016 1129   GLUCOSE 118* 09/05/2014 1459   BUN 23 03/19/2016 1129   BUN 9 09/05/2014 1459   CREATININE 1.41* 03/19/2016 1129   CALCIUM 10.5 03/19/2016 1129   GFRNONAA 48* 02/11/2016 1046   GFRAA 55* 02/11/2016 1046    Lipid Panel     Component Value Date/Time   CHOL 245* 03/19/2016 1129   TRIG 234.0* 03/19/2016 1129   HDL 61.60 03/19/2016 1129   CHOLHDL 4 03/19/2016 1129   VLDL 46.8* 03/19/2016 1129    CBC    Component Value Date/Time   WBC 7.3 03/19/2016 1129   RBC 4.63 03/19/2016 1129   HGB 11.6* 03/19/2016 1129   HCT 35.9* 03/19/2016 1129   PLT 344.0 03/19/2016 1129   MCV 77.5*  03/19/2016 1129   MCH 25.2* 02/11/2016 1046   MCHC 32.4 03/19/2016 1129   RDW 17.0* 03/19/2016 1129   LYMPHSABS 2.7 11/07/2015 0230   MONOABS 0.5 11/07/2015 0230   EOSABS 0.3 11/07/2015 0230   BASOSABS 0.0 11/07/2015 0230    Hgb A1C Lab Results  Component Value Date   HGBA1C 8.7* 03/19/2016        Assessment & Plan:   DM 2 with kidney complications:  Will continue Metformin and Amaryl Encouraged her to consume a low fat, low carb diet and start exercising Will repeat A1C in 3 months, if > 8, add Januvia  HLD:  LDL goal < 100 Encouraged her to consume a low fat, low carb diet and start exercising Continue Zocor Will repeat Lipid Profile in 3 months, if not at goal, add Zetia  Mass of left kidney:  Will obtain MR MRA of abdomen for further evaluation  RTC in 6 months for follow up chronic conditions

## 2016-04-07 NOTE — Progress Notes (Signed)
Pre visit review using our clinic review tool, if applicable. No additional management support is needed unless otherwise documented below in the visit note.

## 2016-04-10 ENCOUNTER — Ambulatory Visit (HOSPITAL_COMMUNITY)
Admission: RE | Admit: 2016-04-10 | Discharge: 2016-04-10 | Disposition: A | Discharge status: Home / Self Care | Payer: Medicare Other | Source: Ambulatory Visit | Attending: Internal Medicine | Admitting: Internal Medicine

## 2016-04-10 DIAGNOSIS — N2889 Other specified disorders of kidney and ureter: Secondary | ICD-10-CM | POA: Diagnosis not present

## 2016-04-10 DIAGNOSIS — N281 Cyst of kidney, acquired: Secondary | ICD-10-CM | POA: Diagnosis not present

## 2016-04-10 MED ORDER — GADOBENATE DIMEGLUMINE 529 MG/ML IV SOLN
20.0000 mL | Freq: Once | INTRAVENOUS | Status: AC | PRN
  Administered 2016-04-10: 20 mL via INTRAVENOUS

## 2016-04-13 ENCOUNTER — Other Ambulatory Visit: Payer: Self-pay | Admitting: Internal Medicine

## 2016-04-13 DIAGNOSIS — N2889 Other specified disorders of kidney and ureter: Secondary | ICD-10-CM

## 2016-04-15 ENCOUNTER — Ambulatory Visit (INDEPENDENT_AMBULATORY_CARE_PROVIDER_SITE_OTHER): Payer: Medicare Other | Admitting: Urology

## 2016-04-15 ENCOUNTER — Ambulatory Visit: Payer: Self-pay | Admitting: Urology

## 2016-04-15 ENCOUNTER — Encounter: Payer: Self-pay | Admitting: Urology

## 2016-04-15 VITALS — BP 168/91 | HR 99 | Ht 63.0 in | Wt 209.0 lb

## 2016-04-15 DIAGNOSIS — N183 Chronic kidney disease, stage 3 (moderate): Secondary | ICD-10-CM | POA: Diagnosis not present

## 2016-04-15 DIAGNOSIS — R591 Generalized enlarged lymph nodes: Secondary | ICD-10-CM

## 2016-04-15 DIAGNOSIS — N2889 Other specified disorders of kidney and ureter: Secondary | ICD-10-CM | POA: Diagnosis not present

## 2016-04-15 NOTE — Progress Notes (Signed)
04/15/2016 2:02 PM   Elizabeth Thompson 1951/08/12 [id number]  Referring provider: Jearld Fenton, NP [address] [place], [place] [address]  Chief Complaint  Patient presents with  . Renal Mass    New Patient    HPI: 65 year old female with schizoaffective disorder who presents today to the office with her sister for further evaluation of an incidental left upper pole renal mass.  This was first identified in the CT PE but not fully characterized. She underwent further workup with an abdominal MRI and 04/10/2016 which indeed shows an enhancing left upper pole renal mass measuring 23 mm consistent with possible RCC.  She also has bilateral Bosniak 1 and 2 renal cyst which are nonenhancing.    She has had several pulmonary illnesses/ SOB over the past 6 months and has been seen multiple times and evaluated in the ER as well. She did have incidental lymphadenopathy which is slightly increased in size from CT PA on 10/2015  To 02/2016.   She does have baseline stage III CKD.   Denies gross hematuria or flank pain.    Previous abdominal surgeries include abdominal hysterectomy and appendectomy.  PMH: Past Medical History  Diagnosis Date  . Hypertension   . Bell's palsy   . Glaucoma   . Schizo-affective psychosis ([place])   . Memory disorder 09/05/2014  . Uterine fibroid   . Hypercalcemia   . Hyperlipidemia   . Back pain   . Chronic low back pain 05/09/2015  . Bronchitis   . Bipolar disorder ([place])   . Frequency of urination   . Cyst of right kidney   . GERD (gastroesophageal reflux disease)   . Arthritis   . Diabetes mellitus without complication ([place])     type 2  . History of colon polyps     Surgical History: Past Surgical History  Procedure Laterality Date  . Abdominal hysterectomy    . Tonsillectomy and adenoidectomy    . Appendectomy    . Colonoscopy w/ polypectomy    . Lumbar laminectomy/decompression microdiscectomy N/A 08/14/2015    Procedure: LUMBAR  DECOMPRESSION MICRODISCECTOMY L3-S1;  Surgeon: Melina Schools, MD;  Location: [facility];  Service: Orthopedics;  Laterality: N/A;    Home Medications:    Medication List       This list is accurate as of: 04/15/16 11:59 PM.  Always use your most recent med list.               albuterol (2.5 MG/3ML) 0.083% nebulizer solution  Commonly known as:  PROVENTIL  Take 2.5 mg by nebulization every 6 (six) hours as needed for wheezing or shortness of breath.     albuterol 108 (90 Base) MCG/ACT inhaler  Commonly known as:  PROVENTIL HFA;VENTOLIN HFA  Inhale 2 puffs into the lungs every 6 (six) hours as needed for wheezing or shortness of breath.     ARIPiprazole 20 MG tablet  Commonly known as:  ABILIFY  Take 1 tablet (20 mg total) by mouth at bedtime.     budesonide-formoterol 160-4.5 MCG/ACT inhaler  Commonly known as:  SYMBICORT  Inhale 2 puffs into the lungs 2 (two) times daily.     donepezil 5 MG tablet  Commonly known as:  ARICEPT  Take 1 tablet (5 mg total) by mouth at bedtime.     dorzolamide-timolol 22.3-6.8 MG/ML ophthalmic solution  Commonly known as:  COSOPT  INSTILL 1 DROP IN OU BID     esomeprazole 20 MG capsule  Commonly  known as:  NEXIUM  Take 20 mg by mouth daily at 12 noon. Reported on 03/19/2016     glimepiride 4 MG tablet  Commonly known as:  AMARYL  Take 1 tablet (4 mg total) by mouth daily with breakfast.     hydrOXYzine 50 MG tablet  Commonly known as:  ATARAX/VISTARIL  Take 50 mg by mouth at bedtime.     Ipratropium-Albuterol 20-100 MCG/ACT Aers respimat  Commonly known as:  COMBIVENT  Inhale 1 puff into the lungs every 6 (six) hours.     irbesartan 300 MG tablet  Commonly known as:  AVAPRO  Take 1 tablet (300 mg total) by mouth daily.     lamoTRIgine 100 MG tablet  Commonly known as:  LAMICTAL  Take 1 tablet (100 mg total) by mouth every evening.     latanoprost 0.005 % ophthalmic solution  Commonly known as:  XALATAN  Place 1 drop into both eyes  at bedtime.     metFORMIN 1000 MG tablet  Commonly known as:  GLUCOPHAGE  Take 1 tablet (1,000 mg total) by mouth 2 (two) times daily with a meal.     methocarbamol 500 MG tablet  Commonly known as:  ROBAXIN  Take 1 tablet (500 mg total) by mouth 3 (three) times daily as needed for muscle spasms.     mirtazapine 7.5 MG tablet  Commonly known as:  REMERON  Take 1 tablet (7.5 mg total) by mouth at bedtime.     multivitamin capsule  Take 1 capsule by mouth daily.     ondansetron 4 MG tablet  Commonly known as:  ZOFRAN  Take 1 tablet (4 mg total) by mouth every 8 (eight) hours as needed for nausea or vomiting.     OXYGEN  Inhale 2 L into the lungs at bedtime.     Selenium Sulf-Pyrithione-Urea 2.25 % Sham  Apply 1 application topically as needed. Applied to scalp for dermatitis     simvastatin 40 MG tablet  Commonly known as:  ZOCOR  Take 1 tablet (40 mg total) by mouth daily.     VITAMIN D3 PO  Take 1 tablet by mouth daily.        Allergies:  Allergies  Allergen Reactions  . Codeine Nausea And Vomiting  . Prednisone Other (See Comments)    Has to monitor because she is a diabetic     Family History: Family History  Problem Relation Age of Onset  . Dementia Mother   . Alzheimer's disease Mother   . Heart disease Mother   . Hypertension Mother   . Diabetes Mother   . Diabetes Father   . Alzheimer's disease Sister   . Heart disease Brother   . Heart attack Brother   . Bladder Cancer Neg Hx   . Kidney cancer Neg Hx   . Prostate cancer Neg Hx     Social History:  reports that she quit smoking about 9 months ago. Her smoking use included Cigarettes. She has a 28 pack-year smoking history. She has never used smokeless tobacco. She reports that she does not drink alcohol or use illicit drugs.  ROS: UROLOGY Frequent Urination?: Yes Hard to postpone urination?: Yes Burning/pain with urination?: No Get up at night to urinate?: Yes Leakage of urine?: No Urine  stream starts and stops?: No Trouble starting stream?: No Do you have to strain to urinate?: No Blood in urine?: No Urinary tract infection?: No Sexually transmitted disease?: No Injury to kidneys or bladder?: No  Painful intercourse?: No Weak stream?: No Currently pregnant?: No Vaginal bleeding?: No Last menstrual period?: n  Gastrointestinal Nausea?: No Vomiting?: No Indigestion/heartburn?: No Diarrhea?: No Constipation?: No  Constitutional Fever: No Night sweats?: No Weight loss?: No Fatigue?: Yes  Skin Skin rash/lesions?: No Itching?: No  Eyes Blurred vision?: No Double vision?: No  Ears/Nose/Throat Sore throat?: No Sinus problems?: No  Hematologic/Lymphatic Swollen glands?: Yes Easy bruising?: No  Cardiovascular Leg swelling?: No Chest pain?: No  Respiratory Cough?: No Shortness of breath?: Yes  Endocrine Excessive thirst?: No  Musculoskeletal Back pain?: No Joint pain?: Yes  Neurological Headaches?: No Dizziness?: No  Psychologic Depression?: Yes Anxiety?: Yes  Physical Exam: BP 168/91 mmHg  Pulse 99  Ht _0  (1.6 m)  Wt 209 lb (94.802 kg)  BMI 37.03 kg/m2  Constitutional:  Alert and oriented, No acute distress.  Accompanied by sister. HEENT: [place] AT, moist mucus membranes.  Trachea midline, no masses. Cardiovascular: No clubbing, cyanosis, or edema. Respiratory: Normal respiratory effort, no increased work of breathing. GI: Abdomen is soft, nontender, nondistended, no abdominal masses.  Abd scars present. GU: No CVA tenderness.  Skin: No rashes, bruises or suspicious lesions. Lymph: No cervical or inguinal adenopathy.  No axillary adenopathy appreciated.   Neurologic: Grossly intact, no focal deficits, moving all 4 extremities. Psychiatric: Normal mood and affect.  Laboratory Data: Lab Results  Component Value Date   WBC 7.3 03/19/2016   HGB 11.6* 03/19/2016   HCT 35.9* 03/19/2016   MCV 77.5* 03/19/2016   PLT 344.0  03/19/2016    Lab Results  Component Value Date   CREATININE 1.41* 03/19/2016    Lab Results  Component Value Date   HGBA1C 8.7* 03/19/2016    Urinalysis    Component Value Date/Time   COLORURINE YELLOW 08/17/2015 0109   APPEARANCEUR CLEAR 08/17/2015 0109   LABSPEC 1.013 08/17/2015 0109   PHURINE 6.0 08/17/2015 0109   GLUCOSEU NEGATIVE 08/17/2015 0109   HGBUR TRACE* 08/17/2015 0109   BILIRUBINUR NEGATIVE 08/17/2015 0109   KETONESUR NEGATIVE 08/17/2015 0109   PROTEINUR 100* 08/17/2015 0109   UROBILINOGEN 0.2 08/17/2015 0109   NITRITE NEGATIVE 08/17/2015 0109   LEUKOCYTESUR NEGATIVE 08/17/2015 0109    Pertinent Imaging:  Study Result     CLINICAL DATA: Indeterminate renal lesion on CT. MRI recommended further characterization.  EXAM: MRI ABDOMEN WITHOUT AND WITH CONTRAST  TECHNIQUE: Multiplanar multisequence MR imaging of the abdomen was performed both before and after the administration of intravenous contrast.  CONTRAST: 65m MULTIHANCE GADOBENATE DIMEGLUMINE 529 MG/ML IV SOLN  COMPARISON: 02/11/2016  FINDINGS: Lower chest: Lung bases are clear.  Hepatobiliary: There is MRI artifact over the LEFT hepatic lobe on the T2 weighted imaging. No enhancing lesion evident T1 weighted postcontrast imaging. No duct dilatation. Gallbladder normal.  Pancreas: Normal pancreatic parenchymal intensity. No ductal dilatation or inflammation.  Spleen: Normal spleen.  Adrenals/urinary tract: Adrenal glands normal.  Along the upper pole of the LEFT kidney exophytic lesion is heterogeneous and hypointense on T2 weighted imaging measuring 17 x 23 mm (image 25, series 3 and seen on series 8). Lesion demonstrates post-contrast enhancement on the T1 weighted imaging (series 1800 and 1900, 1901). Findings are concerning for enhancing renal neoplasm.  There are multiple additional mixed signal intensity round lesions within the LEFT RIGHT kidney. Some of  these lesions are hyperintense on T2 weighted imaging while others are hyperintense on T1 weighted imaging. None of the lesions demonstrate measurable post-contrast enhancement and consistent with benign cystic and hemorrhagic cysts.  Stomach/Bowel: Stomach and limited of the small bowel is unremarkable  Vascular/Lymphatic: Abdominal aortic normal caliber. No retroperitoneal periportal lymphadenopathy.  Musculoskeletal: No aggressive osseous lesion. Paraspinal inflammation in the lumbar spine cysts with spine surgery.  IMPRESSION: 1. 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 2. Multiple additional bilateral Bosniak 1 and Bosniak 2 nonenhancing renal cysts. These results will be called to the ordering clinician or representative by the Radiologist Assistant, and communication documented in the PACS or zVision Dashboard.   Electronically Signed  By: Suzy Bouchard M.D.  On: 04/10/2016 13:39     Assessment & Plan:   1. Left kidney mass Small left upper upper renal mass, 23 mm, enhancing concerning for RCC.  Imaging reviewed personally today with Ms. Hufnagle and her sister.    A solid renal mass raises the suspicion of primary renal malignancy.  We discussed this in detail and in regards to the spectrum of renal masses which includes cysts (pure cysts are considered benign), solid masses and everything in between. The risk of metastasis increases as the size of solid renal mass increases. In general, it is believed that the risk of metastasis for renal masses less than 3-4 cm is small (up to approximately 5%) based mainly on large retrospective studies. In some cases and especially in patients of older age and multiple comorbidities a surveillance approach may be appropriate. The treatment of solid renal masses includes: surveillance, cryoablation (percutaneous and laparoscopic) in addition to partial  and complete nephrectomy (each with option of laparoscopic, robotic and open depending on appropriateness). Furthermore, nephrectomy appears to be an independent risk factor for the development of chronic kidney disease suggesting that nephron sparing approaches should be implored whenever feasible. We reviewed these options in context of the patients current situation as well as the pros and cons of each. For cystic renal masses, we reviewed the Bosniak classification and discussed that Bosniak 3 lesions harbor a 50% chance of malignancy whereas Bosniak 4 cysts have a solid and 90-95% are malignant in nature.   Each prior was discussed today.  Given her multiple comorbidies, would recommend partial nephrectomy (minimally invasive) vs. Ablative therapy.  Briefly discussed each option today.    We discussed that based on the size of this small tumor and slow growth rate, I would prefer that she have f/u work up for lymphadenopathy seen on imaging since at least 10/2015.   See below.   - Urinalysis, Complete  2. Enlarging lymphadenopathy- pretracheal, axillary, and carina persistent and enlarging on serial imaging since 10/2015. Patient symptomatic with lung infection but lymphadenopathy has failed to resolve over 3 month period- may be infection related Recommend referral to oncology to assess further, will likely need interval imaging to assess for possible resolution but will defer timing and modality to oncology There is some concern for possible 2nd primary  Patient and her sister are agreeable with this plan  3. CKD Baseline Cr 1.41, prefer nephron sparing approach for treatment of small renal masses as possible.   Return in about 2 months (around 06/15/2016) for f/u after oncology appt.  Hollice Espy, MD  [facility] [address], [place], [place] [address] [phone]  I spent 45 min with this patient of which greater than 50% was spent in  counseling and coordination of care with the patient.

## 2016-04-20 ENCOUNTER — Telehealth: Payer: Self-pay | Admitting: Oncology

## 2016-04-20 NOTE — Telephone Encounter (Signed)
Verified insurance and address, in basket referring provider, contact pt with appt date/time, completed intake

## 2016-04-24 ENCOUNTER — Ambulatory Visit (HOSPITAL_BASED_OUTPATIENT_CLINIC_OR_DEPARTMENT_OTHER): Payer: Medicare Other | Admitting: Oncology

## 2016-04-24 ENCOUNTER — Telehealth: Payer: Self-pay | Admitting: Oncology

## 2016-04-24 VITALS — BP 145/83 | HR 99 | Temp 97.9°F | Resp 18 | Ht 63.0 in | Wt 212.7 lb

## 2016-04-24 DIAGNOSIS — J449 Chronic obstructive pulmonary disease, unspecified: Secondary | ICD-10-CM

## 2016-04-24 DIAGNOSIS — E119 Type 2 diabetes mellitus without complications: Secondary | ICD-10-CM

## 2016-04-24 DIAGNOSIS — N2889 Other specified disorders of kidney and ureter: Secondary | ICD-10-CM | POA: Diagnosis not present

## 2016-04-24 DIAGNOSIS — I10 Essential (primary) hypertension: Secondary | ICD-10-CM

## 2016-04-24 DIAGNOSIS — Z87891 Personal history of nicotine dependence: Secondary | ICD-10-CM

## 2016-04-24 DIAGNOSIS — N289 Disorder of kidney and ureter, unspecified: Secondary | ICD-10-CM | POA: Diagnosis not present

## 2016-04-24 DIAGNOSIS — F259 Schizoaffective disorder, unspecified: Secondary | ICD-10-CM

## 2016-04-24 DIAGNOSIS — D649 Anemia, unspecified: Secondary | ICD-10-CM

## 2016-04-24 NOTE — Consult Note (Signed)
Reason for Referral: Kidney mass.   HPI: 65 year old woman with history of hypertension, diabetes and schizoaffective disorder referred to me for evaluation for kidney mass. She was in her usual state of health until she presented in February 2017 with symptoms of chest pain and shortness of breath. At that time she had a CT scan of the chest to rule out pulmonary embolism.The CT scan did not show any abnormalities in the chest but did show a left kidney tumor measuring 2.3 x 2.0 cm. Renal cysts were noted on the right side. 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 Multiple renal cysts were noted as well. She is asymptomatic from these findings including no flank pain or hematuria. She does have significant  comorbid conditions including COPD, diabetes and renal insufficiency. He lives with her niece and able to attend to some activities of daily living. She does ambulate with the help of walker but has not reported any major decline in her health her performance status.  She does not report any headaches, blurry vision, syncope or seizures. He does not report any fevers, chills, sweats. He does not report any chest pain, palpitation, orthopnea or leg edema. He does not report any cough, wheezing or hemoptysis. Does not report any nausea, vomiting. He does report frequency, urgency but no hematuria or dysuria. He is not report any lymphadenopathy or petechiae. Remaining review of system is unremarkable.     Past Medical History  Diagnosis Date  . Hypertension   . Bell's palsy   . Glaucoma   . Schizo-affective psychosis ([place])   . Memory disorder 09/05/2014  . Uterine fibroid   . Hypercalcemia   . Hyperlipidemia   . Back pain   . Chronic low back pain 05/09/2015  . Bronchitis   . Bipolar disorder ([place])   . Frequency of urination   . Cyst of right kidney   . GERD  (gastroesophageal reflux disease)   . Arthritis   . Diabetes mellitus without complication ([place])     type 2  . History of colon polyps   :  Past Surgical History  Procedure Laterality Date  . Abdominal hysterectomy    . Tonsillectomy and adenoidectomy    . Appendectomy    . Colonoscopy w/ polypectomy    . Lumbar laminectomy/decompression microdiscectomy N/A 08/14/2015    Procedure: LUMBAR DECOMPRESSION MICRODISCECTOMY L3-S1;  Surgeon: Melina Schools, MD;  Location: [facility];  Service: Orthopedics;  Laterality: N/A;  :   Current outpatient prescriptions:  .  albuterol (PROVENTIL HFA;VENTOLIN HFA) 108 (90 BASE) MCG/ACT inhaler, Inhale 2 puffs into the lungs every 6 (six) hours as needed for wheezing or shortness of breath., Disp: , Rfl:  .  albuterol (PROVENTIL) (2.5 MG/3ML) 0.083% nebulizer solution, Take 2.5 mg by nebulization every 6 (six) hours as needed for wheezing or shortness of breath., Disp: , Rfl:  .  ARIPiprazole (ABILIFY) 20 MG tablet, Take 1 tablet (20 mg total) by mouth at bedtime., Disp: 30 tablet, Rfl: 0 .  budesonide-formoterol (SYMBICORT) 160-4.5 MCG/ACT inhaler, Inhale 2 puffs into the lungs 2 (two) times daily., Disp: , Rfl:  .  Cholecalciferol (VITAMIN D3 PO), Take 1 tablet by mouth daily., Disp: , Rfl:  .  donepezil (ARICEPT) 5 MG tablet, Take 1 tablet (5 mg total) by mouth at bedtime., Disp: 30 tablet, Rfl: 0 .  dorzolamide-timolol (COSOPT) 22.3-6.8 MG/ML ophthalmic solution, INSTILL  1 DROP IN OU BID, Disp: , Rfl: 4 .  esomeprazole (NEXIUM) 20 MG capsule, Take 20 mg by mouth daily at 12 noon. Reported on 03/19/2016, Disp: , Rfl:  .  glimepiride (AMARYL) 4 MG tablet, Take 1 tablet (4 mg total) by mouth daily with breakfast., Disp: , Rfl:  .  hydrOXYzine (ATARAX/VISTARIL) 25 MG tablet, Take 1 tablet by mouth at bedtime as needed., Disp: , Rfl:  .  Ipratropium-Albuterol (COMBIVENT) 20-100 MCG/ACT AERS respimat, Inhale 1 puff into the lungs every 6 (six) hours., Disp: , Rfl:   .  irbesartan (AVAPRO) 300 MG tablet, Take 1 tablet (300 mg total) by mouth daily., Disp: 30 tablet, Rfl: 30 .  lamoTRIgine (LAMICTAL) 100 MG tablet, Take 1 tablet (100 mg total) by mouth every evening., Disp: 30 tablet, Rfl: 0 .  latanoprost (XALATAN) 0.005 % ophthalmic solution, Place 1 drop into both eyes at bedtime., Disp: 2.5 mL, Rfl: 12 .  metFORMIN (GLUCOPHAGE) 1000 MG tablet, Take 1 tablet (1,000 mg total) by mouth 2 (two) times daily with a meal., Disp: , Rfl:  .  methocarbamol (ROBAXIN) 500 MG tablet, Take 1 tablet (500 mg total) by mouth 3 (three) times daily as needed for muscle spasms., Disp: 60 tablet, Rfl: 0 .  mirtazapine (REMERON) 7.5 MG tablet, Take 1 tablet (7.5 mg total) by mouth at bedtime., Disp: 30 tablet, Rfl: 0 .  Multiple Vitamin (MULTIVITAMIN) capsule, Take 1 capsule by mouth daily., Disp: , Rfl:  .  ondansetron (ZOFRAN) 4 MG tablet, Take 1 tablet (4 mg total) by mouth every 8 (eight) hours as needed for nausea or vomiting., Disp: 20 tablet, Rfl: 0 .  OXYGEN, Inhale 2 L into the lungs at bedtime. , Disp: , Rfl:  .  Selenium Sulf-Pyrithione-Urea 2.25 % SHAM, Apply 1 application topically as needed. Applied to scalp for dermatitis, Disp: , Rfl: 3 .  simvastatin (ZOCOR) 40 MG tablet, Take 1 tablet (40 mg total) by mouth daily. (Patient taking differently: Take 40 mg by mouth every evening. ), Disp: 30 tablet, Rfl: :  Allergies  Allergen Reactions  . Codeine Nausea And Vomiting  . Prednisone Other (See Comments)    Has to monitor because she is a diabetic   :  Family History  Problem Relation Age of Onset  . Dementia Mother   . Alzheimer's disease Mother   . Heart disease Mother   . Hypertension Mother   . Diabetes Mother   . Diabetes Father   . Alzheimer's disease Sister   . Heart disease Brother   . Heart attack Brother   . Bladder Cancer Neg Hx   . Kidney cancer Neg Hx   . Prostate cancer Neg Hx   :  Social History   Social History  . Marital Status:  Divorced    Spouse Name: N/A  . Number of Children: 1  . Years of Education: college 4   Occupational History  . disabled    Social History Main Topics  . Smoking status: Former Smoker -- 1.00 packs/day for 28 years    Types: Cigarettes    Quit date: 07/15/2015  . Smokeless tobacco: Never Used  . Alcohol Use: No  . Drug Use: No  . Sexual Activity: No   Other Topics Concern  . Not on file   Social History Narrative   Patient is right handed.   Patient drinks 2 cups caffeine daily.  :  Pertinent items are noted in HPI.  Exam: Blood pressure 145/83,  pulse 99, temperature 97.9 F (36.6 C), temperature source Oral, resp. rate 18, height _0  (1.6 m), weight 212 lb 11.2 oz (96.48 kg), SpO2 98 %. General appearance: alert and cooperative Head: Normocephalic, without obvious abnormality Throat: lips, mucosa, and tongue normal; teeth and gums normal Neck: no adenopathy Back: negative Resp: clear to auscultation bilaterally Chest wall: no tenderness Cardio: regular rate and rhythm, S1, S2 normal, no murmur, click, rub or gallop GI: soft, non-tender; bowel sounds normal; no masses,  no organomegaly Extremities: no edema noted. Skin: Skin color, texture, turgor normal. No rashes or lesions Lymph nodes: Cervical, supraclavicular, and axillary nodes normal.  CBC    Component Value Date/Time   WBC 7.3 03/19/2016 1129   RBC 4.63 03/19/2016 1129   HGB 11.6* 03/19/2016 1129   HCT 35.9* 03/19/2016 1129   PLT 344.0 03/19/2016 1129   MCV 77.5* 03/19/2016 1129   MCH 25.2* 02/11/2016 1046   MCHC 32.4 03/19/2016 1129   RDW 17.0* 03/19/2016 1129   LYMPHSABS 2.7 11/07/2015 0230   MONOABS 0.5 11/07/2015 0230   EOSABS 0.3 11/07/2015 0230   BASOSABS 0.0 11/07/2015 0230      Chemistry      Component Value Date/Time   NA 138 03/19/2016 1129   NA 142 09/05/2014 1459   K 4.7 03/19/2016 1129   CL 107 03/19/2016 1129   CO2 25 03/19/2016 1129   BUN 23 03/19/2016 1129   BUN 9  09/05/2014 1459   CREATININE 1.41* 03/19/2016 1129      Component Value Date/Time   CALCIUM 10.5 03/19/2016 1129   ALKPHOS 75 03/19/2016 1129   AST 14 03/19/2016 1129   ALT 12 03/19/2016 1129   BILITOT 0.3 03/19/2016 1129      Mr Abdomen W Wo Contrast  04/10/2016  CLINICAL DATA:  Indeterminate renal lesion on CT. MRI recommended further characterization. EXAM: MRI ABDOMEN WITHOUT AND WITH CONTRAST TECHNIQUE: Multiplanar multisequence MR imaging of the abdomen was performed both before and after the administration of intravenous contrast. CONTRAST:  59m MULTIHANCE GADOBENATE DIMEGLUMINE 529 MG/ML IV SOLN COMPARISON:  02/11/2016 FINDINGS: Lower chest:  Lung bases are clear. Hepatobiliary: There is MRI artifact over the LEFT hepatic lobe on the T2 weighted imaging. No enhancing lesion evident T1 weighted postcontrast imaging. No duct dilatation. Gallbladder normal. Pancreas: Normal pancreatic parenchymal intensity. No ductal dilatation or inflammation. Spleen: Normal spleen. Adrenals/urinary tract: Adrenal glands normal. Along the upper pole of the LEFT kidney exophytic lesion is heterogeneous and hypointense on T2 weighted imaging measuring 17 x 23 mm (image 25, series 3 and seen on series 8). Lesion demonstrates post-contrast enhancement on the T1 weighted imaging (series 1800 and 1900, 1901). Findings are concerning for enhancing renal neoplasm. There are multiple additional mixed signal intensity round lesions within the LEFT RIGHT kidney. Some of these lesions are hyperintense on T2 weighted imaging while others are hyperintense on T1 weighted imaging. None of the lesions demonstrate measurable post-contrast enhancement and consistent with benign cystic and hemorrhagic cysts. Stomach/Bowel: Stomach and limited of the small bowel is unremarkable Vascular/Lymphatic: Abdominal aortic normal caliber. No retroperitoneal periportal lymphadenopathy. Musculoskeletal: No aggressive osseous lesion. Paraspinal  inflammation in the lumbar spine cysts with spine surgery. IMPRESSION: 1. 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 2. Multiple additional bilateral Bosniak 1 and Bosniak 2 nonenhancing renal cysts. These results will be called to the ordering clinician or representative by the Radiologist Assistant, and communication  documented in the PACS or zVision Dashboard. Electronically Signed   By: Suzy Bouchard M.D.   On: 04/10/2016 13:39    Assessment and Plan:    65 year old woman with the following issues:  1. Left renal mass measuring 17 x 23 mm arising from the pole of the left kidney. The differential diagnosis was discussed with the patient and her niece.This likely represent a kidney neoplasm especially with the tumor measuring 18 by 14 mm in November 2016. Treatment options were reviewed which include complete nephrectomy, partial nephrectomy versus cryoablation.   Given her history of diabetes and renal insufficiency he would twice a day all if she could undergo ablation or partial nephrectomy given the likelihood of developing worsening renal failure with a total nephrectomy. She was seen by [facility] but they requested a second opinion in [place].  I will make referrals to [facility] as well as interventional radiology for an evaluation.  2. Renal insufficiency:Related to long-standing hypertension and diabetes.Her creatinine clearance is close to 50 mL/m  3.Mild anemia: Likely related to renal insufficiency and possibly iron deficiency. This can be monitored and intervention in the future might be necessary.

## 2016-04-24 NOTE — Progress Notes (Signed)
Please see consult note.  

## 2016-04-24 NOTE — Telephone Encounter (Signed)
Gave pt avs

## 2016-04-29 ENCOUNTER — Ambulatory Visit
Admission: RE | Admit: 2016-04-29 | Discharge: 2016-04-29 | Disposition: A | Discharge status: Home / Self Care | Payer: Medicare Other | Source: Ambulatory Visit | Attending: Oncology | Admitting: Oncology

## 2016-04-29 DIAGNOSIS — N2889 Other specified disorders of kidney and ureter: Secondary | ICD-10-CM | POA: Diagnosis not present

## 2016-04-29 HISTORY — PX: IR GENERIC HISTORICAL: IMG1180011

## 2016-04-29 NOTE — Consult Note (Signed)
Chief Complaint: Incidental left kidney mass.   Referring Physician(s): Dr. Hollice Espy, [facility] Dr. Zola Button, Oncology  History of Present Illness: Elizabeth Thompson is a 65 y.o. female presenting today to [facility] for evaluation with a recently discovered incidental left renal mass.  She has been kindly referred by Dr. Erlene Quan to evaluate for candidacy of potential percutaneous ablation.  She presents with her niece, who is a former Marine scientist and her care-giver.   [name] was experiencing chest pain and shortness of breath in February 2017 prompting a PE chest CT, which was negative for PE.  There was an incidental left renal mass identified, which was then evaluated with MR 04/10/2016.  The imaging findings are compatible with RCC, measuring ~2.3cm.    She denies any back pain, and she denies any urinary symptoms such as hematuria.    She is a former smoker, having quit within the past year.  She has a 28-pack-year history. She has been prescribed home O2 to wear at night, with her niece reporting that her O2 falls to low 90's - high 80's when sleeping without O2.  She has had a pulmonary consultation with Dr. Melvyn Novas, with completed PFT's.  She has a history of bronchitis, and also an imaging diagnosis of emphysema on CT.    [name] has no history of cardiac disease, with no prior MI.    She tries to be quit active, with an ECOG of 0.  She enjoys going to the [facility], and participates daily with swimming exercises.  She feels that she has been improving her fitness and losing weight with her new activities after she had recent back surgery.  She has help with her tasks at home by her niece, but is able to ambulate with a walker.    She wants to be able to stay as active as possible.  She has family members currently being treated with metastatic breast CA, which is a factor in her decision making.   Past Medical History    Diagnosis Date  . Hypertension   . Bell's palsy   . Glaucoma   . Schizo-affective psychosis ([place])   . Memory disorder 09/05/2014  . Uterine fibroid   . Hypercalcemia   . Hyperlipidemia   . Back pain   . Chronic low back pain 05/09/2015  . Bronchitis   . Bipolar disorder ([place])   . Frequency of urination   . Cyst of right kidney   . GERD (gastroesophageal reflux disease)   . Arthritis   . Diabetes mellitus without complication ([place])     type 2  . History of colon polyps     Past Surgical History  Procedure Laterality Date  . Abdominal hysterectomy    . Tonsillectomy and adenoidectomy    . Appendectomy    . Colonoscopy w/ polypectomy    . Lumbar laminectomy/decompression microdiscectomy N/A 08/14/2015    Procedure: LUMBAR DECOMPRESSION MICRODISCECTOMY L3-S1;  Surgeon: Melina Schools, MD;  Location: [place];  Service: Orthopedics;  Laterality: N/A;    Allergies: Codeine and Prednisone  Medications: Prior to Admission medications   Medication Sig Start Date End Date Taking? Authorizing Provider  albuterol (PROVENTIL HFA;VENTOLIN HFA) 108 (90 BASE) MCG/ACT inhaler Inhale 2 puffs into the lungs every 6 (six) hours as needed for wheezing or shortness of breath. 08/27/14  Yes Kerrie Buffalo, NP  albuterol (PROVENTIL) (2.5 MG/3ML) 0.083% nebulizer solution Take 2.5 mg by nebulization every 6 (six)  hours as needed for wheezing or shortness of breath.   Yes Historical Provider, MD  ARIPiprazole (ABILIFY) 20 MG tablet Take 1 tablet (20 mg total) by mouth at bedtime. 08/27/14  Yes Kerrie Buffalo, NP  budesonide-formoterol [facility]) 160-4.5 MCG/ACT inhaler Inhale 2 puffs into the lungs 2 (two) times daily.   Yes Historical Provider, MD  Cholecalciferol (VITAMIN D3 PO) Take 1 tablet by mouth daily.   Yes Historical Provider, MD  donepezil (ARICEPT) 5 MG tablet Take 1 tablet (5 mg total) by mouth at bedtime. 08/27/14  Yes Kerrie Buffalo, NP  dorzolamide-timolol (COSOPT) 22.3-6.8 MG/ML  ophthalmic solution INSTILL 1 DROP IN OU BID 03/19/16  Yes Historical Provider, MD  glimepiride (AMARYL) 4 MG tablet Take 1 tablet (4 mg total) by mouth daily with breakfast. 08/27/14  Yes Kerrie Buffalo, NP  hydrOXYzine (ATARAX/VISTARIL) 25 MG tablet Take 1 tablet by mouth at bedtime as needed. 04/17/16  Yes Historical Provider, MD  Ipratropium-Albuterol (COMBIVENT) 20-100 MCG/ACT AERS respimat Inhale 1 puff into the lungs every 6 (six) hours.   Yes Historical Provider, MD  irbesartan (AVAPRO) 300 MG tablet Take 1 tablet (300 mg total) by mouth daily. 12/05/15  Yes Tanda Rockers, MD  latanoprost (XALATAN) 0.005 % ophthalmic solution Place 1 drop into both eyes at bedtime. 08/27/14  Yes Kerrie Buffalo, NP  metFORMIN (GLUCOPHAGE) 1000 MG tablet Take 1 tablet (1,000 mg total) by mouth 2 (two) times daily with a meal. 08/27/14  Yes Kerrie Buffalo, NP  methocarbamol (ROBAXIN) 500 MG tablet Take 1 tablet (500 mg total) by mouth 3 (three) times daily as needed for muscle spasms. 08/14/15  Yes Melina Schools, MD  mirtazapine (REMERON) 7.5 MG tablet Take 1 tablet (7.5 mg total) by mouth at bedtime. 08/27/14  Yes Kerrie Buffalo, NP  Multiple Vitamin (MULTIVITAMIN) capsule Take 1 capsule by mouth daily.   Yes Historical Provider, MD  ondansetron (ZOFRAN) 4 MG tablet Take 1 tablet (4 mg total) by mouth every 8 (eight) hours as needed for nausea or vomiting. 08/14/15  Yes Melina Schools, MD  OXYGEN Inhale 2 L into the lungs at bedtime.    Yes Historical Provider, MD  Selenium Sulf-Pyrithione-Urea 2.25 % SHAM Apply 1 application topically as needed. Applied to scalp for dermatitis 01/25/15  Yes Historical Provider, MD  simvastatin (ZOCOR) 40 MG tablet Take 1 tablet (40 mg total) by mouth daily. Patient taking differently: Take 40 mg by mouth every evening.  08/27/14  Yes Kerrie Buffalo, NP  esomeprazole (NEXIUM) 20 MG capsule Take 20 mg by mouth daily at 12 noon. Reported on 04/29/2016    Historical Provider, MD  lamoTRIgine  (LAMICTAL) 100 MG tablet Take 1 tablet (100 mg total) by mouth every evening. 08/27/14   Kerrie Buffalo, NP     Family History  Problem Relation Age of Onset  . Dementia Mother   . Alzheimer's disease Mother   . Heart disease Mother   . Hypertension Mother   . Diabetes Mother   . Diabetes Father   . Alzheimer's disease Sister   . Heart disease Brother   . Heart attack Brother   . Bladder Cancer Neg Hx   . Kidney cancer Neg Hx   . Prostate cancer Neg Hx     Social History   Social History  . Marital Status: Divorced    Spouse Name: N/A  . Number of Children: 1  . Years of Education: college 4   Occupational History  . disabled    Social History  Main Topics  . Smoking status: Former Smoker -- 1.00 packs/day for 28 years    Types: Cigarettes    Quit date: 07/15/2015  . Smokeless tobacco: Never Used  . Alcohol Use: No  . Drug Use: No  . Sexual Activity: No   Other Topics Concern  . Not on file   Social History Narrative   Patient is right handed.   Patient drinks 2 cups caffeine daily.    ECOG Status: 0 - Asymptomatic  Review of Systems: A 12 point ROS discussed and pertinent positives are indicated in the HPI above.  All other systems are negative.  Review of Systems  Vital Signs: BP 140/79 mmHg  Pulse 97  Temp(Src) 98.6 F (37 C) (Oral)  Resp 15  Ht 5' 3" (1.6 m)  Wt 200 lb (90.719 kg)  BMI 35.44 kg/m2  SpO2 96%  Physical Exam  Atraumatic, normocephalic.  Mucous membranes moist and pink.  Conjugate gaze.  No scleral icterus or injection.  Full ROM of the cervical region.  Alert and oriented to person, place, time.  Flat affect, but appropriate with responses and questions.  Non-labored breathing, with symmetric chest excursion on inspiration and expiration.  Abd obsese, soft, NT/ND.  GU deferred.  Moving all 4 extremities grossly equally, with sensory grossly intact.     Mallampati Score:     Imaging: Mr Abdomen W Wo  Contrast  04/10/2016  CLINICAL DATA:  Indeterminate renal lesion on CT. MRI recommended further characterization. EXAM: MRI ABDOMEN WITHOUT AND WITH CONTRAST TECHNIQUE: Multiplanar multisequence MR imaging of the abdomen was performed both before and after the administration of intravenous contrast. CONTRAST:  51m MULTIHANCE GADOBENATE DIMEGLUMINE 529 MG/ML IV SOLN COMPARISON:  02/11/2016 FINDINGS: Lower chest:  Lung bases are clear. Hepatobiliary: There is MRI artifact over the LEFT hepatic lobe on the T2 weighted imaging. No enhancing lesion evident T1 weighted postcontrast imaging. No duct dilatation. Gallbladder normal. Pancreas: Normal pancreatic parenchymal intensity. No ductal dilatation or inflammation. Spleen: Normal spleen. Adrenals/urinary tract: Adrenal glands normal. Along the upper pole of the LEFT kidney exophytic lesion is heterogeneous and hypointense on T2 weighted imaging measuring 17 x 23 mm (image 25, series 3 and seen on series 8). Lesion demonstrates post-contrast enhancement on the T1 weighted imaging (series 1800 and 1900, 1901). Findings are concerning for enhancing renal neoplasm. There are multiple additional mixed signal intensity round lesions within the LEFT RIGHT kidney. Some of these lesions are hyperintense on T2 weighted imaging while others are hyperintense on T1 weighted imaging. None of the lesions demonstrate measurable post-contrast enhancement and consistent with benign cystic and hemorrhagic cysts. Stomach/Bowel: Stomach and limited of the small bowel is unremarkable Vascular/Lymphatic: Abdominal aortic normal caliber. No retroperitoneal periportal lymphadenopathy. Musculoskeletal: No aggressive osseous lesion. Paraspinal inflammation in the lumbar spine cysts with spine surgery. IMPRESSION: 1. 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 2. Multiple additional bilateral Bosniak 1 and Bosniak 2  nonenhancing renal cysts. These results will be called to the ordering clinician or representative by the Radiologist Assistant, and communication documented in the PACS or zVision Dashboard. Electronically Signed   By: SSuzy BouchardM.D.   On: 04/10/2016 13:39    Labs:  CBC:  Recent Labs  11/08/15 0357 11/09/15 1052 02/11/16 1046 03/19/16 1129  WBC 6.0 6.4 8.1 7.3  HGB 10.6* 12.0 11.3* 11.6*  HCT 34.2* 37.4 35.9* 35.9*  PLT 342 318 347 344.0  COAGS: No results for input(s): INR, APTT in the last 8760 hours.  BMP:  Recent Labs  11/07/15 0230 11/08/15 0357 11/09/15 1052 02/11/16 1046 03/19/16 1129  NA 141 143 138 140 138  K 4.7 4.5 4.1 5.2* 4.7  CL 109 109 105 107 107  CO2 _0 21* 25  GLUCOSE 71 109* 146* 201* 156*  BUN _1 CALCIUM 9.7 9.9 10.2 9.9 10.5  CREATININE 1.17* 1.01* 1.19* 1.17* 1.41*  GFRNONAA 48* 57* 47* 48*  --   GFRAA 55* >60 54* 55*  --     LIVER FUNCTION TESTS:  Recent Labs  11/07/15 0230 11/08/15 0357 03/19/16 1129  BILITOT 0.5 0.6 0.3  AST _2 ALT 15 13* 12  ALKPHOS 59 55 75  PROT 7.1 6.9 7.7  ALBUMIN 3.6 3.7 4.4    TUMOR MARKERS: No results for input(s): AFPTM, CEA, CA199, CHROMGRNA in the last 8760 hours.  Assessment and Plan:  Ms Spengler is a 65 yo female with an incidentally discovered left renal mass, ~2.3cm, and thus stage 1a.    Factors playing a role with decision-making for treatment include her pulmonary dysfunction, as she has a history of smoking and is prescribed home O2 at night.  Also, she has some renal insufficiency, with Cr 1.41 on 03/19/2016.  Thus, nephron-sparing treatment should be highly considered.    I had a long discussion with her regarding RCC and the natural history, with a discussion regarding possible treatment options including watchful waiting/observation.  She is motivated not to use a surveillance strategy, and would prefer treatment.  Given her co-morbidities, I agree that  percutaneous ablation is a good option, and I believe she is a candidate for treatment.  I discussed risks of the procedure, including: bleeding, infection, injury to adjacent structure including the ureter or kidney pelvis, recurrence, nerve injury, need for further procedure/surgery, pneumothorax, skin injury, seeding, cardiopulmonary collapse, death.  I also specified that there are risks to anesthesia, which would be discussed with her by the anesthesia team before proceeding with treatment.    After our discussion, she would like to proceed with ablation.  We will coordinate with her other treating teams.  The treatment will take place most likely at [facility].    Thank you for this interesting consult.  I greatly enjoyed meeting Emine W Olejnik and look forward to participating in their care.  A copy of this report was sent to the requesting provider on this date.  Electronically Signed: Corrie Mckusick 04/29/2016, 5:13 PM   I spent a total of  40 Minutes   in face to face in clinical consultation, greater than 50% of which was counseling/coordinating care for Stage 1a left renal tumor, likely RCC, possible percutaneous ablation for nephron-sparing treatment.

## 2016-04-30 ENCOUNTER — Other Ambulatory Visit: Payer: Self-pay | Admitting: Interventional Radiology

## 2016-04-30 DIAGNOSIS — C642 Malignant neoplasm of left kidney, except renal pelvis: Secondary | ICD-10-CM

## 2016-05-04 ENCOUNTER — Telehealth: Payer: Self-pay | Admitting: Urology

## 2016-05-04 NOTE — Telephone Encounter (Signed)
Contacted oncology regarding consult. Lymphadenopathy not addressed on that visit. Response as below:  "The lymph nodes do not appear pathological to me at this point. Likely reactive in nature vs low grade lymphoproliferative disorder.   I see no reason to do any work at this time other than observation regarding these lymph glands."   Per Dr. Annita Brod, MD

## 2016-05-15 DIAGNOSIS — D49512 Neoplasm of unspecified behavior of left kidney: Secondary | ICD-10-CM | POA: Diagnosis not present

## 2016-05-19 ENCOUNTER — Other Ambulatory Visit: Payer: Self-pay | Admitting: Radiology

## 2016-05-19 NOTE — Patient Instructions (Addendum)
Elizabeth Thompson  05/19/2016   Your procedure is scheduled on: 05/27/2016    Report to [facility] Main  Entrance take [facility]  elevators to 3rd floor to  [place] at   0600 AM.  Call this number if you have problems the morning of surgery [phone]   Remember: ONLY 1 PERSON MAY GO WITH YOU TO SHORT STAY TO GET  READY MORNING OF [place].  Do not eat food or drink liquids :After Midnight.     Take these medicines the morning of surgery with A SIP OF WATER: Albuterol Inhaler and bring, Symbicort Inhaler and bring, nexium, Combivent Inhaler and bring, cosopt eye drops  DO NOT TAKE ANY DIABETIC MEDICATIONS DAY OF YOUR SURGERY                               You may not have any metal on your body including hair pins and              piercings  Do not wear jewelry, make-up, lotions, powders or perfumes, deodorant             Do not wear nail polish.  Do not shave  48 hours prior to surgery.                Do not bring valuables to the hospital. [place].  Contacts, dentures or bridgework may not be worn into surgery.  Leave suitcase in the car. After surgery it may be brought to your room.       Special Instructions: coughing and deep breathing exercises, leg exercises              Please read over the following fact sheets you were given: _____________________________________________________________________             [facility] - Preparing for Surgery Before surgery, you can play an important role.  Because skin is not sterile, your skin needs to be as free of germs as possible.  You can reduce the number of germs on your skin by washing with CHG (chlorahexidine gluconate) soap before surgery.  CHG is an antiseptic cleaner which kills germs and bonds with the skin to continue killing germs even after washing. Please DO NOT use if you have an allergy to CHG or antibacterial soaps.  If your skin  becomes reddened/irritated stop using the CHG and inform your nurse when you arrive at Short Stay. Do not shave (including legs and underarms) for at least 48 hours prior to the first CHG shower.  You may shave your face/neck. Please follow these instructions carefully:  1.  Shower with CHG Soap the night before surgery and the  morning of Surgery.  2.  If you choose to wash your hair, wash your hair first as usual with your  normal  shampoo.  3.  After you shampoo, rinse your hair and body thoroughly to remove the  shampoo.                           4.  Use CHG as you would any other liquid soap.  You can apply chg directly  to the skin and wash  Gently with a scrungie or clean washcloth.  5.  Apply the CHG Soap to your body ONLY FROM THE NECK DOWN.   Do not use on face/ open                           Wound or open sores. Avoid contact with eyes, ears mouth and genitals (private parts).                       Wash face,  Genitals (private parts) with your normal soap.             6.  Wash thoroughly, paying special attention to the area where your surgery  will be performed.  7.  Thoroughly rinse your body with warm water from the neck down.  8.  DO NOT shower/wash with your normal soap after using and rinsing off  the CHG Soap.                9.  Pat yourself dry with a clean towel.            10.  Wear clean pajamas.            11.  Place clean sheets on your bed the night of your first shower and do not  sleep with pets. Day of Surgery : Do not apply any lotions/deodorants the morning of surgery.  Please wear clean clothes to the hospital/surgery center.  FAILURE TO FOLLOW THESE INSTRUCTIONS MAY RESULT IN THE CANCELLATION OF YOUR SURGERY PATIENT SIGNATURE_________________________________  NURSE SIGNATURE__________________________________  ________________________________________________________________________

## 2016-05-21 ENCOUNTER — Encounter (HOSPITAL_COMMUNITY): Payer: Self-pay

## 2016-05-21 ENCOUNTER — Other Ambulatory Visit (HOSPITAL_COMMUNITY): Payer: Self-pay | Admitting: Radiology

## 2016-05-21 ENCOUNTER — Ambulatory Visit (HOSPITAL_COMMUNITY)
Admission: RE | Admit: 2016-05-21 | Discharge: 2016-05-21 | Disposition: A | Discharge status: Home / Self Care | Payer: Medicare Other | Source: Ambulatory Visit | Attending: Radiology | Admitting: Radiology

## 2016-05-21 ENCOUNTER — Encounter: Payer: Self-pay | Admitting: *Deleted

## 2016-05-21 ENCOUNTER — Encounter (HOSPITAL_COMMUNITY)
Admission: RE | Admit: 2016-05-21 | Discharge: 2016-05-21 | Disposition: A | Discharge status: Home / Self Care | Payer: Medicare Other | Source: Ambulatory Visit | Attending: Interventional Radiology | Admitting: Interventional Radiology

## 2016-05-21 DIAGNOSIS — R058 Other specified cough: Secondary | ICD-10-CM

## 2016-05-21 DIAGNOSIS — Z01818 Encounter for other preprocedural examination: Secondary | ICD-10-CM

## 2016-05-21 HISTORY — DX: Other specified cough: R05.8

## 2016-05-21 HISTORY — DX: Cough: R05

## 2016-05-21 LAB — CBC WITH DIFFERENTIAL/PLATELET
BASOS PCT: 0 %
Basophils Absolute: 0 10*3/uL (ref 0.0–0.1)
EOS ABS: 0.4 10*3/uL (ref 0.0–0.7)
Eosinophils Relative: 4 %
HCT: 35.6 % — ABNORMAL LOW (ref 36.0–46.0)
HEMOGLOBIN: 11.7 g/dL — AB (ref 12.0–15.0)
LYMPHS ABS: 3 10*3/uL (ref 0.7–4.0)
Lymphocytes Relative: 32 %
MCH: 26.4 pg (ref 26.0–34.0)
MCHC: 32.9 g/dL (ref 30.0–36.0)
MCV: 80.2 fL (ref 78.0–100.0)
Monocytes Absolute: 0.6 10*3/uL (ref 0.1–1.0)
Monocytes Relative: 6 %
NEUTROS PCT: 58 %
Neutro Abs: 5.4 10*3/uL (ref 1.7–7.7)
Platelets: 343 10*3/uL (ref 150–400)
RBC: 4.44 MIL/uL (ref 3.87–5.11)
RDW: 16.6 % — ABNORMAL HIGH (ref 11.5–15.5)
WBC: 9.3 10*3/uL (ref 4.0–10.5)

## 2016-05-21 LAB — BASIC METABOLIC PANEL
Anion gap: 8 (ref 5–15)
BUN: 24 mg/dL — ABNORMAL HIGH (ref 6–20)
CO2: 24 mmol/L (ref 22–32)
CREATININE: 1.43 mg/dL — AB (ref 0.44–1.00)
Calcium: 10.4 mg/dL — ABNORMAL HIGH (ref 8.9–10.3)
Chloride: 106 mmol/L (ref 101–111)
GFR calc Af Amer: 43 mL/min — ABNORMAL LOW (ref >60)
GFR calc non Af Amer: 38 mL/min — ABNORMAL LOW (ref >60)
Glucose, Bld: 196 mg/dL — ABNORMAL HIGH (ref 65–99)
POTASSIUM: 4.9 mmol/L (ref 3.5–5.1)
SODIUM: 138 mmol/L (ref 135–145)

## 2016-05-21 LAB — ABO/RH: ABO/RH(D): O POS

## 2016-05-21 LAB — PROTIME-INR
INR: 0.92 (ref 0.00–1.49)
PROTHROMBIN TIME: 12.5 s (ref 11.6–15.2)

## 2016-05-21 LAB — APTT: APTT: 37 s (ref 24–37)

## 2016-05-21 NOTE — Pre-Procedure Instructions (Signed)
Order for preop anesthesia consult- Dr. Landry Dyke notified and given patient's medical issues and he is ok with [facility] seeing pt on day of surgery.

## 2016-05-22 ENCOUNTER — Ambulatory Visit: Payer: Medicare Other | Admitting: Podiatry

## 2016-05-23 LAB — HEMOGLOBIN A1C
HEMOGLOBIN A1C: 7.1 % — AB (ref 4.8–5.6)
Mean Plasma Glucose: 157 mg/dL

## 2016-05-26 ENCOUNTER — Other Ambulatory Visit: Payer: Self-pay | Admitting: Physician Assistant

## 2016-05-26 NOTE — Anesthesia Preprocedure Evaluation (Addendum)
Anesthesia Evaluation  Patient identified by MRN, date of birth, ID band Patient awake    Reviewed: Allergy & Precautions, NPO status , Patient's Chart, lab work & pertinent test results  Airway        Dental   Pulmonary COPD, former smoker,           Cardiovascular hypertension, Pt. on medications      Neuro/Psych PSYCHIATRIC DISORDERS Bipolar Disorder  Neuromuscular disease    GI/Hepatic Neg liver ROS, GERD  ,  Endo/Other  diabetes, Type 2  Renal/GU Renal disease     Musculoskeletal  (+) Arthritis ,   Abdominal   Peds  Hematology negative hematology ROS (+) anemia ,   Anesthesia Other Findings   Reproductive/Obstetrics                            Lab Results  Component Value Date   WBC 9.3 05/21/2016   HGB 11.7* 05/21/2016   HCT 35.6* 05/21/2016   MCV 80.2 05/21/2016   PLT 343 05/21/2016   Lab Results  Component Value Date   CREATININE 1.43* 05/21/2016   BUN 24* 05/21/2016   NA 138 05/21/2016   K 4.9 05/21/2016   CL 106 05/21/2016   CO2 24 05/21/2016    Anesthesia Physical Anesthesia Plan  ASA: III  Anesthesia Plan: General   Post-op Pain Management:    Induction: Intravenous  Airway Management Planned: Oral ETT  Additional Equipment:   Intra-op Plan:   Post-operative Plan: Extubation in OR  Informed Consent:   Plan Discussed with:   Anesthesia Plan Comments: (Case cancelled due to acute URI in pt with known COPD. 2wk hx of productive cough, and chills. Discussed with Dr. Earleen Newport prior to procedure and agreed not to proceed in light of increased risk of pulmonary complications. Pt will need to see PCP and possibly pulmonologist but will ultimately need to be symptom free for approximately 2 weeks prior to proceeding with planned procedure.)       Anesthesia Quick Evaluation

## 2016-05-27 ENCOUNTER — Ambulatory Visit (HOSPITAL_COMMUNITY)
Admission: RE | Admit: 2016-05-27 | Discharge: 2016-05-27 | Disposition: A | Discharge status: Home / Self Care | Payer: Medicare Other | Source: Ambulatory Visit | Attending: Interventional Radiology | Admitting: Interventional Radiology

## 2016-05-27 ENCOUNTER — Encounter (HOSPITAL_COMMUNITY)
Admission: RE | Disposition: A | Discharge status: Home / Self Care | Payer: Self-pay | Source: Ambulatory Visit | Attending: Interventional Radiology

## 2016-05-27 ENCOUNTER — Encounter (HOSPITAL_COMMUNITY): Payer: Self-pay | Admitting: General Practice

## 2016-05-27 ENCOUNTER — Encounter (HOSPITAL_COMMUNITY): Payer: Self-pay | Admitting: Anesthesiology

## 2016-05-27 DIAGNOSIS — C642 Malignant neoplasm of left kidney, except renal pelvis: Secondary | ICD-10-CM | POA: Diagnosis not present

## 2016-05-27 DIAGNOSIS — Z5309 Procedure and treatment not carried out because of other contraindication: Secondary | ICD-10-CM | POA: Insufficient documentation

## 2016-05-27 LAB — TYPE AND SCREEN
ABO/RH(D): O POS
Antibody Screen: NEGATIVE

## 2016-05-27 LAB — GLUCOSE, CAPILLARY: Glucose-Capillary: 114 mg/dL — ABNORMAL HIGH (ref 65–99)

## 2016-05-27 SURGERY — RADIO FREQUENCY ABLATION
Anesthesia: General | Laterality: Left

## 2016-05-27 MED ORDER — LACTATED RINGERS IV SOLN
INTRAVENOUS | Status: DC
Start: 2016-05-27 — End: 2016-05-27
  Administered 2016-05-27: 07:00:00 via INTRAVENOUS

## 2016-05-27 MED ORDER — FENTANYL CITRATE (PF) 100 MCG/2ML IJ SOLN
INTRAMUSCULAR | Status: AC
  Filled 2016-05-27: qty 2

## 2016-05-27 MED ORDER — MIDAZOLAM HCL 2 MG/2ML IJ SOLN
INTRAMUSCULAR | Status: AC
  Filled 2016-05-27: qty 2

## 2016-05-27 MED ORDER — PROPOFOL 10 MG/ML IV BOLUS
INTRAVENOUS | Status: AC
  Filled 2016-05-27: qty 20

## 2016-05-27 MED ORDER — LIDOCAINE HCL 1 % IJ SOLN
INTRAMUSCULAR | Status: AC
  Filled 2016-05-27: qty 80

## 2016-05-27 MED ORDER — CEFAZOLIN SODIUM-DEXTROSE 2-4 GM/100ML-% IV SOLN
2.0000 g | INTRAVENOUS | Status: DC
  Filled 2016-05-27: qty 100

## 2016-05-27 NOTE — Progress Notes (Signed)
Patient's procedure has been cancelled today due to an acute URI on top of chronic COPD.  Anesthesia and Dr. Earleen Newport felt like it was safest to cancel today and reschedule after she had been seen by her PCP and symptom free for 2 weeks.  We will reschedule her procedure.  Ethaniel Garfield E 8:38 AM 05/27/2016

## 2016-05-27 NOTE — Progress Notes (Signed)
Case cancelled per Dr. Suzette Battiest, pt. To follow-up with primary care doctor regarding productive cough, and then to be re-scheduled. Pt. And pt's niece verbalized understanding.

## 2016-06-01 ENCOUNTER — Ambulatory Visit (INDEPENDENT_AMBULATORY_CARE_PROVIDER_SITE_OTHER): Payer: Medicare Other | Admitting: Internal Medicine

## 2016-06-01 ENCOUNTER — Encounter: Payer: Self-pay | Admitting: Internal Medicine

## 2016-06-01 VITALS — BP 154/94 | HR 92 | Temp 98.4°F | Wt 212.0 lb

## 2016-06-01 DIAGNOSIS — J449 Chronic obstructive pulmonary disease, unspecified: Secondary | ICD-10-CM

## 2016-06-01 DIAGNOSIS — B349 Viral infection, unspecified: Secondary | ICD-10-CM

## 2016-06-01 DIAGNOSIS — R05 Cough: Secondary | ICD-10-CM | POA: Diagnosis not present

## 2016-06-01 DIAGNOSIS — R059 Cough, unspecified: Secondary | ICD-10-CM

## 2016-06-01 MED ORDER — BENZONATATE 100 MG PO CAPS: 100.0000 mg | ORAL_CAPSULE | Freq: Two times a day (BID) | ORAL | Status: DC | PRN

## 2016-06-01 NOTE — Progress Notes (Signed)
HPI  Pt presents to the clinic today with c/o cough and shortness of breath. This started 3 weeks ago. The cough is productive of yellow/green mucous. She denies runny nose, nasal congestion, ear pain, sore throat. She denies fever, chills or body aches. She has used her nebulizer and Albuterol inhaler with some relief. She saw her oncologist last week, who did an xray which was normal. She has a history of COPD and takes Combivent and Symbicort as prescribed. She has also tried Mucinex and Robitussin with good relief.  Review of Systems      Past Medical History  Diagnosis Date  . Hypertension   . Bell's palsy   . Glaucoma   . Schizo-affective psychosis ([place])   . Memory disorder 09/05/2014  . Uterine fibroid   . Hypercalcemia   . Hyperlipidemia   . Back pain   . Chronic low back pain 05/09/2015  . Bronchitis   . Bipolar disorder ([place])   . Frequency of urination   . Cyst of right kidney   . GERD (gastroesophageal reflux disease)   . Arthritis   . Diabetes mellitus without complication ([place])     type 2  . History of colon polyps   . COPD (chronic obstructive pulmonary disease) ([place])     hyoxia during sleep- using O2 as needed  . Cough with expectoration 05/21/16    recent diagnosis of bronchitis    Family History  Problem Relation Age of Onset  . Dementia Mother   . Alzheimer's disease Mother   . Heart disease Mother   . Hypertension Mother   . Diabetes Mother   . Diabetes Father   . Alzheimer's disease Sister   . Heart disease Brother   . Heart attack Brother   . Bladder Cancer Neg Hx   . Kidney cancer Neg Hx   . Prostate cancer Neg Hx     Social History   Social History  . Marital Status: Divorced    Spouse Name: N/A  . Number of Children: 1  . Years of Education: college 4   Occupational History  . disabled    Social History Main Topics  . Smoking status: Former Smoker -- 1.00 packs/day for 28 years    Types: Cigarettes    Quit date: 07/15/2015  .  Smokeless tobacco: Never Used  . Alcohol Use: No  . Drug Use: No  . Sexual Activity: No   Other Topics Concern  . Not on file   Social History Narrative   Patient is right handed.   Patient drinks 2 cups caffeine daily.    Allergies  Allergen Reactions  . Codeine Nausea And Vomiting  . Prednisone Other (See Comments)    Has to monitor because she is a diabetic      Constitutional: Denies headache, fatigue, fever or abrupt weight changes.  HEENT:  Denies eye redness, eye pain, pressure behind the eyes, facial pain, nasal congestion, ear pain, ringing in the ears, wax buildup, runny nose or sore throat. Respiratory: Positive cough and shortness of breath. Denies difficulty breathing.  Cardiovascular: Denies chest pain, chest tightness, palpitations or swelling in the hands or feet.   No other specific complaints in a complete review of systems (except as listed in HPI above).  Objective:   BP 154/94 mmHg  Pulse 92  Temp(Src) 98.4 F (36.9 C) (Oral)  Wt 212 lb (96.163 kg)  SpO2 91%  Wt Readings from Last 3 Encounters:  06/01/16 212 lb (96.163 kg)  05/27/16 211 lb (95.709 kg)  05/21/16 211 lb (95.709 kg)     General: Appears her stated age, in NAD. HEENT: Head: normal shape and size, no sinus tenderness noted; Eyes: sclera white, no icterus, conjunctiva pink; Ears: Tm's gray and intact, normal light reflex; Throat/Mouth: Teeth present, mucosa pink and moist, no exudate noted, no lesions or ulcerations noted.  Neck: No cervical lymphadenopathy.  Cardiovascular: Normal rate and rhythm. S1,S2 noted.  No murmur, rubs or gallops noted.  Pulmonary/Chest: Normal effort and positive vesicular breath sounds. No respiratory distress. No wheezes, rales or ronchi noted.      Assessment & Plan:   Cough, likely viral:  Continue inhalers, nebulizer, and Mucinex eRx for Tessalon 100 mg TID prn for cough No need to repeat chest xray at this time  RTC as needed or if symptoms  persist. Webb Silversmith, NP

## 2016-06-01 NOTE — Progress Notes (Signed)
Pre visit review using our clinic review tool, if applicable. No additional management support is needed unless otherwise documented below in the visit note.

## 2016-06-01 NOTE — Patient Instructions (Signed)

## 2016-06-11 ENCOUNTER — Other Ambulatory Visit: Payer: Self-pay | Admitting: Internal Medicine

## 2016-06-11 DIAGNOSIS — E119 Type 2 diabetes mellitus without complications: Secondary | ICD-10-CM

## 2016-06-15 ENCOUNTER — Other Ambulatory Visit (INDEPENDENT_AMBULATORY_CARE_PROVIDER_SITE_OTHER): Payer: Medicare Other

## 2016-06-15 DIAGNOSIS — E119 Type 2 diabetes mellitus without complications: Secondary | ICD-10-CM

## 2016-06-15 LAB — HEMOGLOBIN A1C: HEMOGLOBIN A1C: 7.3 % — AB (ref 4.6–6.5)

## 2016-06-18 ENCOUNTER — Other Ambulatory Visit: Payer: Self-pay | Admitting: General Surgery

## 2016-06-22 ENCOUNTER — Telehealth: Payer: Self-pay | Admitting: Internal Medicine

## 2016-06-22 NOTE — Telephone Encounter (Signed)
A1C 7.3, slightly higher from last visit but still reasonably well controlled

## 2016-06-22 NOTE — Telephone Encounter (Signed)
Left message on voicemail

## 2016-06-22 NOTE — Telephone Encounter (Signed)
Pt called requesting results for 7/3 labs. Please call [phone] when available.

## 2016-06-23 ENCOUNTER — Encounter (HOSPITAL_COMMUNITY)
Admission: RE | Admit: 2016-06-23 | Discharge: 2016-06-23 | Disposition: A | Discharge status: Home / Self Care | Payer: Medicare Other | Source: Ambulatory Visit | Attending: Interventional Radiology | Admitting: Interventional Radiology

## 2016-06-23 ENCOUNTER — Encounter (HOSPITAL_COMMUNITY): Payer: Self-pay

## 2016-06-23 DIAGNOSIS — F319 Bipolar disorder, unspecified: Secondary | ICD-10-CM | POA: Diagnosis not present

## 2016-06-23 DIAGNOSIS — Z79899 Other long term (current) drug therapy: Secondary | ICD-10-CM | POA: Diagnosis not present

## 2016-06-23 DIAGNOSIS — Z7984 Long term (current) use of oral hypoglycemic drugs: Secondary | ICD-10-CM | POA: Diagnosis not present

## 2016-06-23 DIAGNOSIS — I10 Essential (primary) hypertension: Secondary | ICD-10-CM | POA: Diagnosis not present

## 2016-06-23 DIAGNOSIS — E785 Hyperlipidemia, unspecified: Secondary | ICD-10-CM | POA: Diagnosis not present

## 2016-06-23 DIAGNOSIS — Z87891 Personal history of nicotine dependence: Secondary | ICD-10-CM | POA: Diagnosis not present

## 2016-06-23 DIAGNOSIS — N2889 Other specified disorders of kidney and ureter: Secondary | ICD-10-CM | POA: Diagnosis not present

## 2016-06-23 DIAGNOSIS — M1990 Unspecified osteoarthritis, unspecified site: Secondary | ICD-10-CM | POA: Diagnosis not present

## 2016-06-23 DIAGNOSIS — E119 Type 2 diabetes mellitus without complications: Secondary | ICD-10-CM | POA: Diagnosis not present

## 2016-06-23 HISTORY — DX: Personal history of nicotine dependence: Z87.891

## 2016-06-23 HISTORY — DX: Reserved for inherently not codable concepts without codable children: IMO0001

## 2016-06-23 HISTORY — DX: Personal history of other diseases of the digestive system: Z87.19

## 2016-06-23 HISTORY — DX: Personal history of other medical treatment: Z92.89

## 2016-06-23 HISTORY — DX: Dependence on supplemental oxygen: Z99.81

## 2016-06-23 LAB — BASIC METABOLIC PANEL
ANION GAP: 7 (ref 5–15)
BUN: 16 mg/dL (ref 6–20)
CO2: 25 mmol/L (ref 22–32)
Calcium: 10.5 mg/dL — ABNORMAL HIGH (ref 8.9–10.3)
Chloride: 109 mmol/L (ref 101–111)
Creatinine, Ser: 1.53 mg/dL — ABNORMAL HIGH (ref 0.44–1.00)
GFR calc Af Amer: 40 mL/min — ABNORMAL LOW (ref >60)
GFR calc non Af Amer: 35 mL/min — ABNORMAL LOW (ref >60)
GLUCOSE: 85 mg/dL (ref 65–99)
POTASSIUM: 6.1 mmol/L — AB (ref 3.5–5.1)
Sodium: 141 mmol/L (ref 135–145)

## 2016-06-23 LAB — CBC
HEMATOCRIT: 37.9 % (ref 36.0–46.0)
HEMOGLOBIN: 12.2 g/dL (ref 12.0–15.0)
MCH: 26.9 pg (ref 26.0–34.0)
MCHC: 32.2 g/dL (ref 30.0–36.0)
MCV: 83.7 fL (ref 78.0–100.0)
PLATELETS: 341 10*3/uL (ref 150–400)
RBC: 4.53 MIL/uL (ref 3.87–5.11)
RDW: 16.5 % — AB (ref 11.5–15.5)
WBC: 6.8 10*3/uL (ref 4.0–10.5)

## 2016-06-23 LAB — APTT: aPTT: 35 seconds (ref 24–37)

## 2016-06-23 LAB — PROTIME-INR
INR: 0.98 (ref 0.00–1.49)
PROTHROMBIN TIME: 12.8 s (ref 11.6–15.2)

## 2016-06-23 NOTE — Patient Instructions (Signed)
Elizabeth Thompson  06/23/2016   Your procedure is scheduled on: Friday June 26, 2016  Report to [facility]  Entrance take [place]  elevators to 3rd floor to  [place] at 9:45 AM.  Call this number if you have problems the morning of surgery [phone]   Remember: ONLY 1 PERSON MAY GO WITH YOU TO SHORT STAY TO GET  READY MORNING OF [place].  Do not eat food or drink liquids :After Midnight.     Take these medicines the morning of surgery with A SIP OF WATER: Cimetidine (Tagamet); Eye drops (bring bottles day of surgery); May use Albuterol Inhaler if needed and Albuterol Nebulizer Treatment if needed; Symbicort; Combivent (bring all inhalers with you day of surgery  DO NOT TAKE ANY DIABETIC MEDICATIONS DAY OF YOUR SURGERY                               You may not have any metal on your body including hair pins and              piercings  Do not wear jewelry, make-up, lotions, powders or perfumes, deodorant             Do not wear nail polish.  Do not shave  48 hours prior to surgery.              Do not bring valuables to the hospital. [place].  Contacts, dentures or bridgework may not be worn into surgery.  Leave suitcase in the car. After surgery it may be brought to your room.              _____________________________________________________________________             [facility] - Preparing for Surgery Before surgery, you can play an important role.  Because skin is not sterile, your skin needs to be as free of germs as possible.  You can reduce the number of germs on your skin by washing with CHG (chlorahexidine gluconate) soap before surgery.  CHG is an antiseptic cleaner which kills germs and bonds with the skin to continue killing germs even after washing. Please DO NOT use if you have an allergy to CHG or antibacterial soaps.  If your skin becomes reddened/irritated stop using the  CHG and inform your nurse when you arrive at Short Stay. Do not shave (including legs and underarms) for at least 48 hours prior to the first CHG shower.  You may shave your face/neck. Please follow these instructions carefully:  1.  Shower with CHG Soap the night before surgery and the  morning of Surgery.  2.  If you choose to wash your hair, wash your hair first as usual with your  normal  shampoo.  3.  After you shampoo, rinse your hair and body thoroughly to remove the  shampoo.                           4.  Use CHG as you would any other liquid soap.  You can apply chg directly  to the skin and wash  Gently with a scrungie or clean washcloth.  5.  Apply the CHG Soap to your body ONLY FROM THE NECK DOWN.   Do not use on face/ open                           Wound or open sores. Avoid contact with eyes, ears mouth and genitals (private parts).                       Wash face,  Genitals (private parts) with your normal soap.             6.  Wash thoroughly, paying special attention to the area where your surgery  will be performed.  7.  Thoroughly rinse your body with warm water from the neck down.  8.  DO NOT shower/wash with your normal soap after using and rinsing off  the CHG Soap.                9.  Pat yourself dry with a clean towel.            10.  Wear clean pajamas.            11.  Place clean sheets on your bed the night of your first shower and do not  sleep with pets. Day of Surgery : Do not apply any lotions/deodorants the morning of surgery.  Please wear clean clothes to the hospital/surgery center.  FAILURE TO FOLLOW THESE INSTRUCTIONS MAY RESULT IN THE CANCELLATION OF YOUR SURGERY PATIENT SIGNATURE_________________________________  NURSE SIGNATURE__________________________________  ________________________________________________________________________

## 2016-06-23 NOTE — Progress Notes (Signed)
BMP results in epic per PAT visit 06/23/2016 sent to Dr Damita Dunnings

## 2016-06-24 ENCOUNTER — Other Ambulatory Visit: Payer: Self-pay | Admitting: General Surgery

## 2016-06-25 ENCOUNTER — Other Ambulatory Visit: Payer: Self-pay | Admitting: Radiology

## 2016-06-25 ENCOUNTER — Telehealth: Payer: Self-pay | Admitting: Internal Medicine

## 2016-06-25 NOTE — Telephone Encounter (Signed)
Pt returning call regarding lab  [phone]

## 2016-06-26 ENCOUNTER — Encounter (HOSPITAL_COMMUNITY): Payer: Self-pay | Admitting: Anesthesiology

## 2016-06-26 ENCOUNTER — Observation Stay (HOSPITAL_COMMUNITY)
Admission: RE | Admit: 2016-06-26 | Discharge: 2016-06-27 | Disposition: A | Discharge status: Home / Self Care | Payer: Medicare Other | Source: Ambulatory Visit | Attending: Interventional Radiology | Admitting: Interventional Radiology

## 2016-06-26 ENCOUNTER — Encounter (HOSPITAL_COMMUNITY)
Admission: RE | Disposition: A | Discharge status: Home / Self Care | Payer: Self-pay | Source: Ambulatory Visit | Attending: Interventional Radiology

## 2016-06-26 ENCOUNTER — Ambulatory Visit (HOSPITAL_COMMUNITY)
Admission: RE | Admit: 2016-06-26 | Discharge: 2016-06-26 | Disposition: A | Discharge status: Home / Self Care | Payer: Medicare Other | Source: Ambulatory Visit | Attending: Interventional Radiology | Admitting: Interventional Radiology

## 2016-06-26 ENCOUNTER — Encounter (HOSPITAL_COMMUNITY): Payer: Self-pay

## 2016-06-26 ENCOUNTER — Ambulatory Visit (HOSPITAL_COMMUNITY): Payer: Medicare Other | Admitting: Anesthesiology

## 2016-06-26 ENCOUNTER — Ambulatory Visit: Payer: Medicare Other | Admitting: Urology

## 2016-06-26 DIAGNOSIS — F319 Bipolar disorder, unspecified: Secondary | ICD-10-CM | POA: Insufficient documentation

## 2016-06-26 DIAGNOSIS — N2889 Other specified disorders of kidney and ureter: Secondary | ICD-10-CM | POA: Diagnosis not present

## 2016-06-26 DIAGNOSIS — Z79899 Other long term (current) drug therapy: Secondary | ICD-10-CM | POA: Diagnosis not present

## 2016-06-26 DIAGNOSIS — Z7984 Long term (current) use of oral hypoglycemic drugs: Secondary | ICD-10-CM | POA: Insufficient documentation

## 2016-06-26 DIAGNOSIS — E785 Hyperlipidemia, unspecified: Secondary | ICD-10-CM | POA: Insufficient documentation

## 2016-06-26 DIAGNOSIS — N189 Chronic kidney disease, unspecified: Secondary | ICD-10-CM | POA: Diagnosis not present

## 2016-06-26 DIAGNOSIS — I10 Essential (primary) hypertension: Secondary | ICD-10-CM | POA: Insufficient documentation

## 2016-06-26 DIAGNOSIS — Z87891 Personal history of nicotine dependence: Secondary | ICD-10-CM | POA: Diagnosis not present

## 2016-06-26 DIAGNOSIS — J449 Chronic obstructive pulmonary disease, unspecified: Secondary | ICD-10-CM | POA: Diagnosis not present

## 2016-06-26 DIAGNOSIS — I129 Hypertensive chronic kidney disease with stage 1 through stage 4 chronic kidney disease, or unspecified chronic kidney disease: Secondary | ICD-10-CM | POA: Diagnosis not present

## 2016-06-26 DIAGNOSIS — M1990 Unspecified osteoarthritis, unspecified site: Secondary | ICD-10-CM | POA: Diagnosis not present

## 2016-06-26 DIAGNOSIS — C649 Malignant neoplasm of unspecified kidney, except renal pelvis: Secondary | ICD-10-CM | POA: Diagnosis not present

## 2016-06-26 DIAGNOSIS — E119 Type 2 diabetes mellitus without complications: Secondary | ICD-10-CM | POA: Insufficient documentation

## 2016-06-26 DIAGNOSIS — D4102 Neoplasm of uncertain behavior of left kidney: Secondary | ICD-10-CM | POA: Diagnosis not present

## 2016-06-26 DIAGNOSIS — C642 Malignant neoplasm of left kidney, except renal pelvis: Secondary | ICD-10-CM | POA: Diagnosis not present

## 2016-06-26 LAB — BASIC METABOLIC PANEL
ANION GAP: 8 (ref 5–15)
BUN: 23 mg/dL — ABNORMAL HIGH (ref 6–20)
CO2: 22 mmol/L (ref 22–32)
Calcium: 10 mg/dL (ref 8.9–10.3)
Chloride: 108 mmol/L (ref 101–111)
Creatinine, Ser: 1.61 mg/dL — ABNORMAL HIGH (ref 0.44–1.00)
GFR calc Af Amer: 38 mL/min — ABNORMAL LOW (ref >60)
GFR, EST NON AFRICAN AMERICAN: 33 mL/min — AB (ref >60)
GLUCOSE: 126 mg/dL — AB (ref 65–99)
POTASSIUM: 4.7 mmol/L (ref 3.5–5.1)
SODIUM: 138 mmol/L (ref 135–145)

## 2016-06-26 LAB — TYPE AND SCREEN
ABO/RH(D): O POS
ANTIBODY SCREEN: NEGATIVE

## 2016-06-26 LAB — GLUCOSE, CAPILLARY
GLUCOSE-CAPILLARY: 116 mg/dL — AB (ref 65–99)
Glucose-Capillary: 144 mg/dL — ABNORMAL HIGH (ref 65–99)
Glucose-Capillary: 139 mg/dL — ABNORMAL HIGH (ref 65–99)

## 2016-06-26 SURGERY — RADIO FREQUENCY ABLATION
Anesthesia: General | Laterality: Left

## 2016-06-26 MED ORDER — MOMETASONE FURO-FORMOTEROL FUM 200-5 MCG/ACT IN AERO
2.0000 | INHALATION_SPRAY | Freq: Two times a day (BID) | RESPIRATORY_TRACT | Status: DC
  Administered 2016-06-26 – 2016-06-27 (×2): 2 via RESPIRATORY_TRACT
  Filled 2016-06-26: qty 8.8

## 2016-06-26 MED ORDER — IPRATROPIUM-ALBUTEROL 20-100 MCG/ACT IN AERS: 1.0000 | INHALATION_SPRAY | Freq: Four times a day (QID) | RESPIRATORY_TRACT | Status: DC

## 2016-06-26 MED ORDER — DORZOLAMIDE HCL-TIMOLOL MAL 2-0.5 % OP SOLN
1.0000 [drp] | Freq: Two times a day (BID) | OPHTHALMIC | Status: DC
  Administered 2016-06-26 – 2016-06-27 (×2): 1 [drp] via OPHTHALMIC
  Filled 2016-06-26: qty 10

## 2016-06-26 MED ORDER — IOPAMIDOL (ISOVUE-370) INJECTION 76%
100.0000 mL | Freq: Once | INTRAVENOUS | Status: AC | PRN
  Administered 2016-06-26: 50 mL via INTRAVENOUS

## 2016-06-26 MED ORDER — SIMVASTATIN 40 MG PO TABS: 40.0000 mg | ORAL_TABLET | Freq: Every day | ORAL | Status: DC

## 2016-06-26 MED ORDER — MIDAZOLAM HCL 2 MG/2ML IJ SOLN
INTRAMUSCULAR | Status: AC
  Filled 2016-06-26: qty 2

## 2016-06-26 MED ORDER — ONDANSETRON HCL 4 MG PO TABS: 4.0000 mg | ORAL_TABLET | Freq: Three times a day (TID) | ORAL | Status: DC | PRN

## 2016-06-26 MED ORDER — ALBUTEROL SULFATE HFA 108 (90 BASE) MCG/ACT IN AERS: 2.0000 | INHALATION_SPRAY | Freq: Four times a day (QID) | RESPIRATORY_TRACT | Status: DC | PRN

## 2016-06-26 MED ORDER — ARIPIPRAZOLE 10 MG PO TABS: 20.0000 mg | ORAL_TABLET | Freq: Every day | ORAL | Status: DC

## 2016-06-26 MED ORDER — IRBESARTAN 300 MG PO TABS: 300.0000 mg | ORAL_TABLET | Freq: Every day | ORAL | Status: DC

## 2016-06-26 MED ORDER — GLIMEPIRIDE 4 MG PO TABS: 4.0000 mg | ORAL_TABLET | Freq: Every day | ORAL | Status: DC

## 2016-06-26 MED ORDER — PANTOPRAZOLE SODIUM 40 MG PO TBEC
40.0000 mg | DELAYED_RELEASE_TABLET | Freq: Every day | ORAL | Status: DC
  Administered 2016-06-27: 40 mg via ORAL
  Filled 2016-06-26: qty 1

## 2016-06-26 MED ORDER — MIRTAZAPINE 7.5 MG PO TABS: 7.5000 mg | ORAL_TABLET | Freq: Every day | ORAL | Status: DC

## 2016-06-26 MED ORDER — LATANOPROST 0.005 % OP SOLN: 1.0000 [drp] | Freq: Every day | OPHTHALMIC | Status: DC

## 2016-06-26 MED ORDER — PANTOPRAZOLE SODIUM 40 MG PO TBEC: 40.0000 mg | DELAYED_RELEASE_TABLET | Freq: Every day | ORAL | Status: DC

## 2016-06-26 MED ORDER — ARIPIPRAZOLE 10 MG PO TABS
20.0000 mg | ORAL_TABLET | Freq: Every day | ORAL | Status: DC
  Administered 2016-06-26: 20 mg via ORAL
  Filled 2016-06-26: qty 2

## 2016-06-26 MED ORDER — ACETAMINOPHEN 325 MG PO TABS
325.0000 mg | ORAL_TABLET | ORAL | Status: AC | PRN
  Filled 2016-06-26: qty 1

## 2016-06-26 MED ORDER — SIMVASTATIN 40 MG PO TABS
40.0000 mg | ORAL_TABLET | Freq: Every evening | ORAL | Status: DC
  Administered 2016-06-26: 40 mg via ORAL
  Filled 2016-06-26: qty 1

## 2016-06-26 MED ORDER — PROMETHAZINE HCL 25 MG/ML IJ SOLN: 6.2500 mg | INTRAMUSCULAR | Status: DC | PRN

## 2016-06-26 MED ORDER — IRBESARTAN 150 MG PO TABS
300.0000 mg | ORAL_TABLET | Freq: Every day | ORAL | Status: DC
  Administered 2016-06-27: 300 mg via ORAL
  Filled 2016-06-26: qty 2

## 2016-06-26 MED ORDER — DOCUSATE SODIUM 100 MG PO CAPS
100.0000 mg | ORAL_CAPSULE | Freq: Two times a day (BID) | ORAL | Status: DC
  Administered 2016-06-26 – 2016-06-27 (×2): 100 mg via ORAL
  Filled 2016-06-26 (×2): qty 1

## 2016-06-26 MED ORDER — LAMOTRIGINE 100 MG PO TABS
100.0000 mg | ORAL_TABLET | Freq: Every evening | ORAL | Status: DC
  Administered 2016-06-26: 100 mg via ORAL
  Filled 2016-06-26: qty 1

## 2016-06-26 MED ORDER — DORZOLAMIDE HCL-TIMOLOL MAL 2-0.5 % OP SOLN: 1.0000 [drp] | Freq: Two times a day (BID) | OPHTHALMIC | Status: DC

## 2016-06-26 MED ORDER — SENNOSIDES-DOCUSATE SODIUM 8.6-50 MG PO TABS: 1.0000 | ORAL_TABLET | Freq: Every day | ORAL | Status: DC | PRN

## 2016-06-26 MED ORDER — MIRTAZAPINE 15 MG PO TABS
7.5000 mg | ORAL_TABLET | Freq: Every day | ORAL | Status: DC
  Administered 2016-06-26: 7.5 mg via ORAL
  Filled 2016-06-26: qty 1

## 2016-06-26 MED ORDER — ACETAMINOPHEN 325 MG PO TABS
325.0000 mg | ORAL_TABLET | ORAL | Status: DC | PRN
  Administered 2016-06-27 (×2): 325 mg via ORAL
  Filled 2016-06-26 (×2): qty 1

## 2016-06-26 MED ORDER — MOMETASONE FURO-FORMOTEROL FUM 200-5 MCG/ACT IN AERO: 2.0000 | INHALATION_SPRAY | Freq: Two times a day (BID) | RESPIRATORY_TRACT | Status: DC

## 2016-06-26 MED ORDER — HYDROMORPHONE HCL 1 MG/ML IJ SOLN: 0.2500 mg | INTRAMUSCULAR | Status: DC | PRN

## 2016-06-26 MED ORDER — ONDANSETRON HCL 4 MG/2ML IJ SOLN: 4.0000 mg | Freq: Four times a day (QID) | INTRAMUSCULAR | Status: DC | PRN

## 2016-06-26 MED ORDER — SODIUM CHLORIDE 0.9 % IV SOLN
INTRAVENOUS | Status: AC
  Administered 2016-06-26: 23:00:00 via INTRAVENOUS

## 2016-06-26 MED ORDER — FENTANYL CITRATE (PF) 250 MCG/5ML IJ SOLN
INTRAMUSCULAR | Status: AC
  Filled 2016-06-26: qty 5

## 2016-06-26 MED ORDER — FAMOTIDINE 20 MG PO TABS
10.0000 mg | ORAL_TABLET | Freq: Every day | ORAL | Status: DC
  Administered 2016-06-27: 10 mg via ORAL
  Filled 2016-06-26: qty 1

## 2016-06-26 MED ORDER — IPRATROPIUM-ALBUTEROL 0.5-2.5 (3) MG/3ML IN SOLN
3.0000 mL | Freq: Four times a day (QID) | RESPIRATORY_TRACT | Status: DC
  Administered 2016-06-26: 3 mL via RESPIRATORY_TRACT
  Filled 2016-06-26: qty 3

## 2016-06-26 MED ORDER — SODIUM CHLORIDE 0.9 % IV SOLN: INTRAVENOUS | Status: AC

## 2016-06-26 MED ORDER — FAMOTIDINE 10 MG PO TABS: 10.0000 mg | ORAL_TABLET | Freq: Every day | ORAL | Status: DC

## 2016-06-26 MED ORDER — LAMOTRIGINE 100 MG PO TABS: 100.0000 mg | ORAL_TABLET | Freq: Every evening | ORAL | Status: DC

## 2016-06-26 MED ORDER — LATANOPROST 0.005 % OP SOLN
1.0000 [drp] | Freq: Every day | OPHTHALMIC | Status: DC
  Administered 2016-06-26: 1 [drp] via OPHTHALMIC
  Filled 2016-06-26: qty 2.5

## 2016-06-26 MED ORDER — ALBUTEROL SULFATE (2.5 MG/3ML) 0.083% IN NEBU: 2.5000 mg | INHALATION_SOLUTION | Freq: Four times a day (QID) | RESPIRATORY_TRACT | Status: DC | PRN

## 2016-06-26 MED ORDER — SODIUM CHLORIDE 0.9 % IV SOLN
INTRAVENOUS | Status: DC
  Administered 2016-06-26 – 2016-06-27 (×2): via INTRAVENOUS

## 2016-06-26 MED ORDER — DOCUSATE SODIUM 100 MG PO CAPS: 100.0000 mg | ORAL_CAPSULE | Freq: Two times a day (BID) | ORAL | Status: DC

## 2016-06-26 MED ORDER — GLIMEPIRIDE 4 MG PO TABS
4.0000 mg | ORAL_TABLET | Freq: Every day | ORAL | Status: DC
  Administered 2016-06-27: 4 mg via ORAL
  Filled 2016-06-26: qty 1

## 2016-06-26 MED ORDER — CEFAZOLIN SODIUM-DEXTROSE 2-4 GM/100ML-% IV SOLN
2.0000 g | INTRAVENOUS | Status: DC
  Filled 2016-06-26: qty 100

## 2016-06-26 MED ORDER — LACTATED RINGERS IV SOLN
INTRAVENOUS | Status: DC
  Administered 2016-06-26: 11:00:00 via INTRAVENOUS

## 2016-06-26 NOTE — H&P (Signed)
Patient ID: Elizabeth Thompson, female   DOB: [birth year], 65 y.o.   MRN: [id number]    Referring Physician(s): Brandon,A/Shadad,F  Supervising Physician: Corrie Mckusick  Patient Status:  Outpatient TBA  Chief Complaint: Left renal mass   Subjective: Patient familiar to IR service from recent consultation with Dr. Earleen Newport on 04/29/16 to discuss treatment options for an incidentally discovered left renal mass. She was experiencing chest pain and shortness of breath in February 2017 prompting a PE chest CT, which was negative for PE. There was an incidental left renal mass identified, which was then evaluated with MR 04/10/2016. The imaging findings are compatible with RCC, measuring ~2.3cm. She is a former smoker, having quit within the past year. She has a 28-pack-year history. She has been prescribed home O2 to wear at night, with her niece reporting that her O2 falls to low 90's - high 80's when sleeping without O2. She has had a pulmonary consultation with Dr. Melvyn Novas, with completed PFT's. She has a history of bronchitis, and also an imaging diagnosis of emphysema on CT. She has no history of cardiac disease, with no prior MI. Following review of imaging she was felt to be an appropriate candidate for CT-guided percutaneous cryoablation of the left renal mass and she presents today for the procedure. She currently denies fever, headache, chest pain, cough, abdominal pain, nausea, vomiting, hematuria or dysuria. She does have some dyspnea with exertion as well as intermittent low back pain. Additional history as below. Past Medical History  Diagnosis Date  . Hypertension   . Bell's palsy   . Glaucoma   . Schizo-affective psychosis ([place])   . Memory disorder 09/05/2014  . Uterine fibroid   . Hypercalcemia   . Hyperlipidemia   . Back pain   . Chronic low back pain 05/09/2015  . Bronchitis   . Bipolar disorder ([place])   . Frequency of urination   . Cyst of right kidney   . GERD  (gastroesophageal reflux disease)   . Arthritis   . Diabetes mellitus without complication ([place])     type 2  . History of colon polyps   . COPD (chronic obstructive pulmonary disease) ([place])     hyoxia during sleep- using O2 as needed  . Cough with expectoration 05/21/16    recent diagnosis of bronchitis  . Shortness of breath dyspnea     exertion   . History of tobacco use   . History of hiatal hernia   . History of blood transfusion   . On home oxygen therapy     3L/M [place] at night    Past Surgical History  Procedure Laterality Date  . Abdominal hysterectomy    . Tonsillectomy and adenoidectomy    . Appendectomy    . Colonoscopy w/ polypectomy    . Lumbar laminectomy/decompression microdiscectomy N/A 08/14/2015    Procedure: LUMBAR DECOMPRESSION MICRODISCECTOMY L3-S1;  Surgeon: Melina Schools, MD;  Location: [facility];  Service: Orthopedics;  Laterality: N/A;     Allergies: Codeine and Prednisone  Medications: Prior to Admission medications   Medication Sig Start Date End Date Taking? Authorizing Provider  albuterol (PROVENTIL HFA;VENTOLIN HFA) 108 (90 BASE) MCG/ACT inhaler Inhale 2 puffs into the lungs every 6 (six) hours as needed for wheezing or shortness of breath. 08/27/14  Yes Kerrie Buffalo, NP  albuterol (PROVENTIL) (2.5 MG/3ML) 0.083% nebulizer solution Take 2.5 mg by nebulization every 6 (six) hours as needed for wheezing or shortness of breath.   Yes Historical Provider, MD  ARIPiprazole (ABILIFY) 20 MG tablet Take 1 tablet (20 mg total) by mouth at bedtime. 08/27/14  Yes Kerrie Buffalo, NP  benzonatate (TESSALON) 100 MG capsule Take 1 capsule (100 mg total) by mouth 2 (two) times daily as needed for cough. 06/01/16  Yes Jearld Fenton, NP  budesonide-formoterol [facility]) 160-4.5 MCG/ACT inhaler Inhale 2 puffs into the lungs 2 (two) times daily.   Yes Historical Provider, MD  Cholecalciferol (VITAMIN D3 PO) Take 1 tablet by mouth daily.   Yes Historical Provider, MD    Cimetidine (TAGAMET PO) Take 2 tablets by mouth daily.   Yes Historical Provider, MD  donepezil (ARICEPT) 5 MG tablet Take 1 tablet (5 mg total) by mouth at bedtime. 08/27/14  Yes Kerrie Buffalo, NP  dorzolamide-timolol (COSOPT) 22.3-6.8 MG/ML ophthalmic solution Place 1 drop into both eyes 2 (two) times daily.   Yes Historical Provider, MD  esomeprazole (NEXIUM) 20 MG capsule Take 20 mg by mouth daily as needed (For heartburn or acid reflux.).    Yes Historical Provider, MD  glimepiride (AMARYL) 4 MG tablet Take 1 tablet (4 mg total) by mouth daily with breakfast. 08/27/14  Yes Kerrie Buffalo, NP  hydrOXYzine (ATARAX/VISTARIL) 25 MG tablet Take 25 mg by mouth at bedtime as needed for anxiety.  04/17/16  Yes Historical Provider, MD  Ipratropium-Albuterol (COMBIVENT) 20-100 MCG/ACT AERS respimat Inhale 1 puff into the lungs every 6 (six) hours.   Yes Historical Provider, MD  lamoTRIgine (LAMICTAL) 100 MG tablet Take 1 tablet (100 mg total) by mouth every evening. 08/27/14  Yes Kerrie Buffalo, NP  latanoprost (XALATAN) 0.005 % ophthalmic solution Place 1 drop into both eyes at bedtime. 08/27/14  Yes Kerrie Buffalo, NP  Menthol, Topical Analgesic, (BIOFREEZE) 10 % LIQD Apply 1 application topically as needed.   Yes Historical Provider, MD  metFORMIN (GLUCOPHAGE) 1000 MG tablet Take 1 tablet (1,000 mg total) by mouth 2 (two) times daily with a meal. 08/27/14  Yes Kerrie Buffalo, NP  methocarbamol (ROBAXIN) 500 MG tablet Take 1 tablet (500 mg total) by mouth 3 (three) times daily as needed for muscle spasms. 08/14/15  Yes Melina Schools, MD  mirtazapine (REMERON) 7.5 MG tablet Take 1 tablet (7.5 mg total) by mouth at bedtime. 08/27/14  Yes Kerrie Buffalo, NP  Multiple Vitamin (MULTIVITAMIN) capsule Take 1 capsule by mouth daily.   Yes Historical Provider, MD  ondansetron (ZOFRAN) 4 MG tablet Take 1 tablet (4 mg total) by mouth every 8 (eight) hours as needed for nausea or vomiting. 08/14/15  Yes Melina Schools, MD   OXYGEN Inhale 3 L into the lungs at bedtime.    Yes Historical Provider, MD  Selenium Sulf-Pyrithione-Urea 2.25 % SHAM Apply 1 application topically as needed. Applied to scalp for dermatitis 01/25/15  Yes Historical Provider, MD  senna (SENOKOT) 8.6 MG tablet Take 1 tablet by mouth daily.   Yes Historical Provider, MD  simvastatin (ZOCOR) 40 MG tablet Take 1 tablet (40 mg total) by mouth daily. Patient taking differently: Take 40 mg by mouth every evening.  08/27/14  Yes Kerrie Buffalo, NP  ibuprofen (ADVIL,MOTRIN) 200 MG tablet Take 600 mg by mouth every 6 (six) hours as needed (For pain.).    Historical Provider, MD  irbesartan (AVAPRO) 300 MG tablet Take 1 tablet (300 mg total) by mouth daily. 12/05/15   Tanda Rockers, MD     Vital Signs: BP 128/64 mmHg  Pulse 95  Temp(Src) 97.8 F (36.6 C) (Oral)  Resp 18  Wt 211 lb  6.4 oz (95.89 kg)  SpO2 92%  Physical Exam patient awake, alert. Chest with distant but clear breath sounds bilaterally. Heart with regular rate and rhythm. Abdomen obese, soft, positive bowel sounds, nontender. Lower extremities with no edema.  Imaging: No results found.  Labs:  CBC:  Recent Labs  02/11/16 1046 03/19/16 1129 05/21/16 1205 06/23/16 1215  WBC 8.1 7.3 9.3 6.8  HGB 11.3* 11.6* 11.7* 12.2  HCT 35.9* 35.9* 35.6* 37.9  PLT 347 344.0 343 341    COAGS:  Recent Labs  05/21/16 1205 06/23/16 1215  INR 0.92 0.98  APTT 37 35    BMP:  Recent Labs  11/09/15 1052 02/11/16 1046 03/19/16 1129 05/21/16 1205 06/23/16 1215  NA 138 140 138 138 141  K 4.1 5.2* 4.7 4.9 6.1*  CL 105 107 107 106 109  CO2 23 21* _0 GLUCOSE 146* 201* 156* 196* 85  BUN _1 24* 16  CALCIUM 10.2 9.9 10.5 10.4* 10.5*  CREATININE 1.19* 1.17* 1.41* 1.43* 1.53*  GFRNONAA 47* 48*  --  38* 35*  GFRAA 54* 55*  --  43* 40*    LIVER FUNCTION TESTS:  Recent Labs  11/07/15 0230 11/08/15 0357 03/19/16 1129  BILITOT 0.5 0.6 0.3  AST _2 ALT 15  13* 12  ALKPHOS 59 55 75  PROT 7.1 6.9 7.7  ALBUMIN 3.6 3.7 4.4    Assessment and Plan: Patient with history of COPD on home oxygen therapy, diabetes, hypertension, and incidentally noted left renal mass measuring up to 2.3 cm and 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 She presents today following recent consultation with Dr. Earleen Newport for CT-guided percutaneous cryoablation/possible biopsy of the left renal mass. Details/risks of procedure, including not limited to, internal bleeding, infection, injury to adjacent structures, anesthesia-related complications discussed with patient and niece with their understanding and consent. Postprocedure the patient will be admitted for overnight observation. Creatinine today is 1.63- Dr. Earleen Newport made aware- will increase IVF rate to 125 cc/hr.   Electronically Signed: D. Rowe Robert 06/26/2016, 10:20 AM   I spent a total of 30 minutes at the the patient's bedside AND on the patient's hospital floor or unit, greater than 50% of which was counseling/coordinating care for CT-guided percutaneous cryoablation/possible biopsy of left renal mass

## 2016-06-26 NOTE — Transfer of Care (Signed)
Immediate Anesthesia Transfer of Care Note  Patient: Elizabeth Thompson  Procedure(s) Performed: Procedure(s): LEFT RENAL CRYO ABLATION (Left)  Patient Location: PACU  Anesthesia Type:General  Level of Consciousness:  sedated, patient cooperative and responds to stimulation  Airway & Oxygen Therapy:Patient Spontanous Breathing and Patient connected to face mask oxgen  Post-op Assessment:  Report given to PACU RN and Post -op Vital signs reviewed and stable  Post vital signs:  Reviewed and stable  Last Vitals:  Filed Vitals:   06/26/16 0948  BP: 128/64  Pulse: 95  Temp: 36.6 C  Resp: 18    Complications: No apparent anesthesia complications

## 2016-06-26 NOTE — Anesthesia Postprocedure Evaluation (Signed)
Anesthesia Post Note  Patient: Elizabeth Thompson  Procedure(s) Performed: Procedure(s) (LRB): LEFT RENAL CRYO ABLATION (Left)  Patient location during evaluation: PACU Anesthesia Type: General Level of consciousness: sedated Pain management: pain level controlled Vital Signs Assessment: post-procedure vital signs reviewed and stable Respiratory status: spontaneous breathing Cardiovascular status: stable Postop Assessment: no signs of nausea or vomiting Anesthetic complications: no     Last Vitals:  Filed Vitals:   06/26/16 1446 06/26/16 1500  BP: 157/87 141/65  Pulse: 81   Temp: 36.4 C   Resp: 11     Last Pain:  Filed Vitals:   06/26/16 1504  PainSc: 0-No pain   Pain Goal: Patients Stated Pain Goal: 4 (06/26/16 1052)               [place]

## 2016-06-26 NOTE — Anesthesia Preprocedure Evaluation (Addendum)
Anesthesia Evaluation  Patient identified by MRN, date of birth, ID band Patient awake    Reviewed: Allergy & Precautions, NPO status , Patient's Chart, lab work & pertinent test results  History of Anesthesia Complications Negative for: history of anesthetic complications  Airway Mallampati: I  TM Distance: >3 FB Neck ROM: Full    Dental  (+) Edentulous Upper, Edentulous Lower, Dental Advisory Given   Pulmonary COPD, former smoker,    Pulmonary exam normal        Cardiovascular hypertension, Pt. on medications Normal cardiovascular exam     Neuro/Psych PSYCHIATRIC DISORDERS Bipolar Disorder  Neuromuscular disease    GI/Hepatic Neg liver ROS, GERD  ,  Endo/Other  diabetes, Type 2  Renal/GU Renal disease     Musculoskeletal  (+) Arthritis ,   Abdominal   Peds  Hematology negative hematology ROS (+) anemia ,   Anesthesia Other Findings   Reproductive/Obstetrics                            Lab Results  Component Value Date   WBC 6.8 06/23/2016   HGB 12.2 06/23/2016   HCT 37.9 06/23/2016   MCV 83.7 06/23/2016   PLT 341 06/23/2016   Lab Results  Component Value Date   CREATININE 1.53* 06/23/2016   BUN 16 06/23/2016   NA 141 06/23/2016   K 6.1* 06/23/2016   CL 109 06/23/2016   CO2 25 06/23/2016    Anesthesia Physical  Anesthesia Plan  ASA: III  Anesthesia Plan: General   Post-op Pain Management:    Induction: Intravenous  Airway Management Planned: Oral ETT  Additional Equipment:   Intra-op Plan:   Post-operative Plan: Extubation in OR  Informed Consent: I have reviewed the patients History and Physical, chart, labs and discussed the procedure including the risks, benefits and alternatives for the proposed anesthesia with the patient or authorized representative who has indicated his/her understanding and acceptance.   Dental advisory given  Plan Discussed with:  CRNA and Anesthesiologist  Anesthesia Plan Comments:        Anesthesia Quick Evaluation

## 2016-06-26 NOTE — Procedures (Signed)
Interventional Radiology Procedure Note  Procedure: CT guided left kidney cryoablation, presumed RCC, with tissue biopsy.   2 x Galil IceForce cryoablation probes used to bracket tumor at superior left kidney.    General Anesthesia  EBL = 0  Sample:  2 x 18G core biopsy to lab  Complications: No  Recommendations:  - Observe 1 night [facility] - Regular Diet - 150cc/hr normal saline post x 6 hours - Observe for hematuria - May DC Foley when patient OK to ambulate - Do not submerge site for 7 days - Routine local wound care - Post op Follow up in 2-4 weeks with Dr. Earleen Newport - First contrast enhanced CT (abdomen only) in 3 months  Signed,  Dulcy Fanny. Earleen Newport, DO

## 2016-06-27 DIAGNOSIS — M1990 Unspecified osteoarthritis, unspecified site: Secondary | ICD-10-CM | POA: Diagnosis not present

## 2016-06-27 DIAGNOSIS — Z87891 Personal history of nicotine dependence: Secondary | ICD-10-CM | POA: Diagnosis not present

## 2016-06-27 DIAGNOSIS — E119 Type 2 diabetes mellitus without complications: Secondary | ICD-10-CM | POA: Diagnosis not present

## 2016-06-27 DIAGNOSIS — E785 Hyperlipidemia, unspecified: Secondary | ICD-10-CM | POA: Diagnosis not present

## 2016-06-27 DIAGNOSIS — Z79899 Other long term (current) drug therapy: Secondary | ICD-10-CM | POA: Diagnosis not present

## 2016-06-27 DIAGNOSIS — I10 Essential (primary) hypertension: Secondary | ICD-10-CM | POA: Diagnosis not present

## 2016-06-27 DIAGNOSIS — C649 Malignant neoplasm of unspecified kidney, except renal pelvis: Secondary | ICD-10-CM | POA: Diagnosis not present

## 2016-06-27 DIAGNOSIS — Z7984 Long term (current) use of oral hypoglycemic drugs: Secondary | ICD-10-CM | POA: Diagnosis not present

## 2016-06-27 DIAGNOSIS — N2889 Other specified disorders of kidney and ureter: Secondary | ICD-10-CM | POA: Diagnosis not present

## 2016-06-27 MED ORDER — IPRATROPIUM-ALBUTEROL 0.5-2.5 (3) MG/3ML IN SOLN
3.0000 mL | Freq: Three times a day (TID) | RESPIRATORY_TRACT | Status: DC
  Administered 2016-06-27: 3 mL via RESPIRATORY_TRACT
  Filled 2016-06-27: qty 3

## 2016-06-27 NOTE — Progress Notes (Signed)
Pt discharged home with family in stable condition. Discharge instructions given. Pt verbalized understanding

## 2016-06-27 NOTE — Discharge Summary (Signed)
Patient ID: Elizabeth Thompson MRN: [id number] DOB/AGE: 07/05/1951 64 y.o.  Admit date: 06/26/2016 Discharge date: 06/27/2016  Supervising Physician: Corrie Mckusick  Admission Diagnoses: Left renal mass  Discharge Diagnoses:  Active Problems:   Renal malignant tumor [facility])   Discharged Condition: stable  Hospital Course: Incidental finding of left renal mass consistent with Renal Cell Ca per MRI 03/2016-- During admission for SOB and CT performed to rule out PE (which was neg) Biopsy and cryoablation performed in IR 06/26/16 with Dr Earleen Newport Pt tolerated well; no complication. Overnight stay was without event Slept well Eating well without N/V Denies pain Has urinated on own and has had BM All wnl I have seen and examined pt Discussed status with Dr Earleen Newport Plan for discharge today.  Consults: stable  Significant Diagnostic Studies: Ct Abdomen  Treatments: Interventional Radiology Procedure Note  Procedure: CT guided left kidney cryoablation, presumed RCC, with tissue biopsy. 2 x Galil IceForce cryoablation probes used to bracket tumor at superior left kidney.   General Anesthesia  EBL = 0  Sample: 2 x 18G core biopsy to lab  Discharge Exam: Blood pressure 119/77, pulse 86, temperature 98.5 F (36.9 C), temperature source Oral, resp. rate 16, weight 211 lb 6.4 oz (95.89 kg), SpO2 92 %.   Pleasant Drowsy A/O Heart: RRR Lungs: CTA Abd: soft; NT; no masses Left flank site: clean; NT no bleeding; no hematoma UOP: great; yellow Extr: FROM Has ambulated in room to restroom (with help)  Results for orders placed or performed during the hospital encounter of 82/70/78  Basic metabolic panel  Result Value Ref Range   Sodium 138 135 - 145 mmol/L   Potassium 4.7 3.5 - 5.1 mmol/L   Chloride 108 101 - 111 mmol/L   CO2 22 22 - 32 mmol/L   Glucose, Bld 126 (H) 65 - 99 mg/dL   BUN 23 (H) 6 - 20 mg/dL   Creatinine, Ser 1.61 (H) 0.44 - 1.00 mg/dL   Calcium 10.0 8.9 -  10.3 mg/dL   GFR calc non Af Amer 33 (L) >60 mL/min   GFR calc Af Amer 38 (L) >60 mL/min   Anion gap 8 5 - 15  Glucose, capillary  Result Value Ref Range   Glucose-Capillary 116 (H) 65 - 99 mg/dL  Glucose, capillary  Result Value Ref Range   Glucose-Capillary 144 (H) 65 - 99 mg/dL   Comment 1 Notify RN    Comment 2 Document in Chart   Glucose, capillary  Result Value Ref Range   Glucose-Capillary 139 (H) 65 - 99 mg/dL   Comment 1 Notify RN      Disposition: Left renal mass cryoablation performed in IR with Dr Earleen Newport 6/75/4492 No complications UOP good and clear yellow Plan for discharge home now. Resume all home meds 2-4 week follow up at [facility] Niece has good understanding of discharge instructions   Discharge Instructions    (HEART FAILURE PATIENTS) Call MD:  Anytime you have any of the following symptoms: 1) 3 pound weight gain in 24 hours or 5 pounds in 1 week 2) shortness of breath, with or without a dry hacking cough 3) swelling in the hands, feet or stomach 4) if you have to sleep on extra pillows at night in order to breathe.    Complete by:  As directed      Call MD for:  difficulty breathing, headache or visual disturbances    Complete by:  As directed  Call MD for:  extreme fatigue    Complete by:  As directed      Call MD for:  hives    Complete by:  As directed      Call MD for:  persistant dizziness or light-headedness    Complete by:  As directed      Call MD for:  persistant nausea and vomiting    Complete by:  As directed      Call MD for:  redness, tenderness, or signs of infection (pain, swelling, redness, odor or green/yellow discharge around incision site)    Complete by:  As directed      Call MD for:  severe uncontrolled pain    Complete by:  As directed      Call MD for:  temperature >100.4    Complete by:  As directed      Diet - low sodium heart healthy    Complete by:  As directed      Discharge instructions    Complete by:  As  directed   Follow up with Dr Earleen Newport 2-4 weeks in [facility]; we will call pt with time and date; resume all home meds; call [phone] if questions or concerns     Discharge wound care:    Complete by:  As directed   May bathe today; keep clean band aid on site daily x 1 week     Driving Restrictions    Complete by:  As directed   No driving 1 week     Increase activity slowly    Complete by:  As directed      Lifting restrictions    Complete by:  As directed   No lifting over 10 lbs 1 week            Medication List    TAKE these medications        albuterol (2.5 MG/3ML) 0.083% nebulizer solution  Commonly known as:  PROVENTIL  Take 2.5 mg by nebulization every 6 (six) hours as needed for wheezing or shortness of breath.     albuterol 108 (90 Base) MCG/ACT inhaler  Commonly known as:  PROVENTIL HFA;VENTOLIN HFA  Inhale 2 puffs into the lungs every 6 (six) hours as needed for wheezing or shortness of breath.     ARIPiprazole 20 MG tablet  Commonly known as:  ABILIFY  Take 1 tablet (20 mg total) by mouth at bedtime.     benzonatate 100 MG capsule  Commonly known as:  TESSALON  Take 1 capsule (100 mg total) by mouth 2 (two) times daily as needed for cough.     BIOFREEZE 10 % Liqd  Generic drug:  Menthol (Topical Analgesic)  Apply 1 application topically as needed.     budesonide-formoterol 160-4.5 MCG/ACT inhaler  Commonly known as:  SYMBICORT  Inhale 2 puffs into the lungs 2 (two) times daily.     donepezil 5 MG tablet  Commonly known as:  ARICEPT  Take 1 tablet (5 mg total) by mouth at bedtime.     dorzolamide-timolol 22.3-6.8 MG/ML ophthalmic solution  Commonly known as:  COSOPT  Place 1 drop into both eyes 2 (two) times daily.     esomeprazole 20 MG capsule  Commonly known as:  NEXIUM  Take 20 mg by mouth daily as needed (For heartburn or acid reflux.).     glimepiride 4 MG tablet  Commonly known as:  AMARYL  Take 1 tablet (4 mg total) by mouth daily  with breakfast.     hydrOXYzine 25 MG tablet  Commonly known as:  ATARAX/VISTARIL  Take 25 mg by mouth at bedtime as needed for anxiety.     ibuprofen 200 MG tablet  Commonly known as:  ADVIL,MOTRIN  Take 600 mg by mouth every 6 (six) hours as needed (For pain.).     Ipratropium-Albuterol 20-100 MCG/ACT Aers respimat  Commonly known as:  COMBIVENT  Inhale 1 puff into the lungs every 6 (six) hours.     irbesartan 300 MG tablet  Commonly known as:  AVAPRO  Take 1 tablet (300 mg total) by mouth daily.     lamoTRIgine 100 MG tablet  Commonly known as:  LAMICTAL  Take 1 tablet (100 mg total) by mouth every evening.     latanoprost 0.005 % ophthalmic solution  Commonly known as:  XALATAN  Place 1 drop into both eyes at bedtime.     metFORMIN 1000 MG tablet  Commonly known as:  GLUCOPHAGE  Take 1 tablet (1,000 mg total) by mouth 2 (two) times daily with a meal.     methocarbamol 500 MG tablet  Commonly known as:  ROBAXIN  Take 1 tablet (500 mg total) by mouth 3 (three) times daily as needed for muscle spasms.     mirtazapine 7.5 MG tablet  Commonly known as:  REMERON  Take 1 tablet (7.5 mg total) by mouth at bedtime.     multivitamin capsule  Take 1 capsule by mouth daily.     ondansetron 4 MG tablet  Commonly known as:  ZOFRAN  Take 1 tablet (4 mg total) by mouth every 8 (eight) hours as needed for nausea or vomiting.     OXYGEN  Inhale 3 L into the lungs at bedtime.     Selenium Sulf-Pyrithione-Urea 2.25 % Sham  Apply 1 application topically as needed. Applied to scalp for dermatitis     senna 8.6 MG tablet  Commonly known as:  SENOKOT  Take 1 tablet by mouth daily.     simvastatin 40 MG tablet  Commonly known as:  ZOCOR  Take 1 tablet (40 mg total) by mouth daily.     TAGAMET PO  Take 2 tablets by mouth daily.     VITAMIN D3 PO  Take 1 tablet by mouth daily.           Follow-up Information    Follow up with WAGNER, JAIME, DO In 3 weeks.   Specialty:   Interventional Radiology   Why:  pt will hear from IR scheduler for time and date of follow up; call [phone] if questions or concerns   Contact information:   [address] [place] [address] [phone]        Electronically Signed: Monia Sabal A 06/27/2016, 9:32 AM   I have spent Greater Than 30 Minutes discharging [place].

## 2016-07-06 ENCOUNTER — Other Ambulatory Visit: Payer: Self-pay

## 2016-07-16 ENCOUNTER — Ambulatory Visit
Admission: RE | Admit: 2016-07-16 | Discharge: 2016-07-16 | Disposition: A | Discharge status: Home / Self Care | Payer: Medicare Other | Source: Ambulatory Visit | Attending: Radiology | Admitting: Radiology

## 2016-07-16 DIAGNOSIS — N2889 Other specified disorders of kidney and ureter: Secondary | ICD-10-CM

## 2016-07-16 HISTORY — PX: IR GENERIC HISTORICAL: IMG1180011

## 2016-07-16 NOTE — Progress Notes (Signed)
Chief Complaint: Left Renal Mass  Referring Physician(s): Turpin,Pamela  History of Present Illness: Elizabeth Thompson is a 65 y.o. female presenting to vascular and interventional radiology clinic as a scheduled follow-up appointment, status post cryoablation of left renal tumor, superior pole.  CT-guided cryoablation performed 06/26/2016, with intraoperative biopsy performed. Pathology:  Diagnosis Kidney, biopsy, left CLEAR CELL RENAL CELL CARCINOMA, WHO NUCLEAR GRADE 1 WITH PREDOMINANT HYALINIZED STROMA  Elizabeth Thompson presents today to the clinic with her niece, her caregiver. She has recovered quite well, after his discharge which was the morning after the procedure 06/27/2016. She has had no problems since that time.  Upon inspection of the puncture site of the back, there are tiny healing incisions, with no sign of infection.  She has no abdominal or flank pain. She denies any hematuria or any difficulty. She has not been hospitalized in the interval.  Her only complaints are for her continued low oxygen saturation at home, for which they have pulmonology appointments. She has known difficulty with COPD.    Past Medical History:  Diagnosis Date  . Arthritis   . Back pain   . Bell's palsy   . Bipolar disorder ([place])   . Bronchitis   . Chronic low back pain 05/09/2015  . COPD (chronic obstructive pulmonary disease) ([place])    hyoxia during sleep- using O2 as needed  . Cough with expectoration 05/21/16   recent diagnosis of bronchitis  . Cyst of right kidney   . Diabetes mellitus without complication ([place])    type 2  . Frequency of urination   . GERD (gastroesophageal reflux disease)   . Glaucoma   . History of blood transfusion   . History of colon polyps   . History of hiatal hernia   . History of tobacco use   . Hypercalcemia   . Hyperlipidemia   . Hypertension   . Memory disorder 09/05/2014  . On home oxygen therapy    3L/M [place] at night   . Schizo-affective  psychosis ([place])   . Shortness of breath dyspnea    exertion   . Uterine fibroid     Past Surgical History:  Procedure Laterality Date  . ABDOMINAL HYSTERECTOMY    . APPENDECTOMY    . COLONOSCOPY W/ POLYPECTOMY    . LUMBAR LAMINECTOMY/DECOMPRESSION MICRODISCECTOMY N/A 08/14/2015   Procedure: LUMBAR DECOMPRESSION MICRODISCECTOMY L3-S1;  Surgeon: Melina Schools, MD;  Location: [facility];  Service: Orthopedics;  Laterality: N/A;  . TONSILLECTOMY AND ADENOIDECTOMY      Allergies: Codeine and Prednisone  Medications: Prior to Admission medications   Medication Sig Start Date End Date Taking? Authorizing Provider  albuterol (PROVENTIL HFA;VENTOLIN HFA) 108 (90 BASE) MCG/ACT inhaler Inhale 2 puffs into the lungs every 6 (six) hours as needed for wheezing or shortness of breath. 08/27/14   Kerrie Buffalo, NP  albuterol (PROVENTIL) (2.5 MG/3ML) 0.083% nebulizer solution Take 2.5 mg by nebulization every 6 (six) hours as needed for wheezing or shortness of breath.    Historical Provider, MD  ARIPiprazole (ABILIFY) 20 MG tablet Take 1 tablet (20 mg total) by mouth at bedtime. 08/27/14   Kerrie Buffalo, NP  benzonatate (TESSALON) 100 MG capsule Take 1 capsule (100 mg total) by mouth 2 (two) times daily as needed for cough. 06/01/16   Jearld Fenton, NP  budesonide-formoterol [facility]) 160-4.5 MCG/ACT inhaler Inhale 2 puffs into the lungs 2 (two) times daily.    Historical Provider, MD  Cholecalciferol (VITAMIN D3 PO) Take 1 tablet  by mouth daily.    Historical Provider, MD  Cimetidine (TAGAMET PO) Take 2 tablets by mouth daily.    Historical Provider, MD  donepezil (ARICEPT) 5 MG tablet Take 1 tablet (5 mg total) by mouth at bedtime. 08/27/14   Kerrie Buffalo, NP  dorzolamide-timolol (COSOPT) 22.3-6.8 MG/ML ophthalmic solution Place 1 drop into both eyes 2 (two) times daily.    Historical Provider, MD  esomeprazole (NEXIUM) 20 MG capsule Take 20 mg by mouth daily as needed (For heartburn or acid reflux.).      Historical Provider, MD  glimepiride (AMARYL) 4 MG tablet Take 1 tablet (4 mg total) by mouth daily with breakfast. 08/27/14   Kerrie Buffalo, NP  hydrOXYzine (ATARAX/VISTARIL) 25 MG tablet Take 25 mg by mouth at bedtime as needed for anxiety.  04/17/16   Historical Provider, MD  ibuprofen (ADVIL,MOTRIN) 200 MG tablet Take 600 mg by mouth every 6 (six) hours as needed (For pain.).    Historical Provider, MD  Ipratropium-Albuterol (COMBIVENT) 20-100 MCG/ACT AERS respimat Inhale 1 puff into the lungs every 6 (six) hours.    Historical Provider, MD  irbesartan (AVAPRO) 300 MG tablet Take 1 tablet (300 mg total) by mouth daily. 12/05/15   Tanda Rockers, MD  lamoTRIgine (LAMICTAL) 100 MG tablet Take 1 tablet (100 mg total) by mouth every evening. 08/27/14   Kerrie Buffalo, NP  latanoprost (XALATAN) 0.005 % ophthalmic solution Place 1 drop into both eyes at bedtime. 08/27/14   Kerrie Buffalo, NP  Menthol, Topical Analgesic, (BIOFREEZE) 10 % LIQD Apply 1 application topically as needed.    Historical Provider, MD  metFORMIN (GLUCOPHAGE) 1000 MG tablet Take 1 tablet (1,000 mg total) by mouth 2 (two) times daily with a meal. 08/27/14   Kerrie Buffalo, NP  methocarbamol (ROBAXIN) 500 MG tablet Take 1 tablet (500 mg total) by mouth 3 (three) times daily as needed for muscle spasms. 08/14/15   Melina Schools, MD  mirtazapine (REMERON) 7.5 MG tablet Take 1 tablet (7.5 mg total) by mouth at bedtime. 08/27/14   Kerrie Buffalo, NP  Multiple Vitamin (MULTIVITAMIN) capsule Take 1 capsule by mouth daily.    Historical Provider, MD  ondansetron (ZOFRAN) 4 MG tablet Take 1 tablet (4 mg total) by mouth every 8 (eight) hours as needed for nausea or vomiting. 08/14/15   Melina Schools, MD  OXYGEN Inhale 3 L into the lungs at bedtime.     Historical Provider, MD  Selenium Sulf-Pyrithione-Urea 2.25 % SHAM Apply 1 application topically as needed. Applied to scalp for dermatitis 01/25/15   Historical Provider, MD  senna (SENOKOT) 8.6  MG tablet Take 1 tablet by mouth daily.    Historical Provider, MD  simvastatin (ZOCOR) 40 MG tablet Take 1 tablet (40 mg total) by mouth daily. Patient taking differently: Take 40 mg by mouth every evening.  08/27/14   Kerrie Buffalo, NP     Family History  Problem Relation Age of Onset  . Dementia Mother   . Alzheimer's disease Mother   . Heart disease Mother   . Hypertension Mother   . Diabetes Mother   . Diabetes Father   . Alzheimer's disease Sister   . Heart disease Brother   . Heart attack Brother   . Bladder Cancer Neg Hx   . Kidney cancer Neg Hx   . Prostate cancer Neg Hx     Social History   Social History  . Marital status: Divorced    Spouse name: N/A  . Number of  children: 1  . Years of education: college 4   Occupational History  . disabled    Social History Main Topics  . Smoking status: Former Smoker    Packs/day: 1.00    Years: 28.00    Types: Cigarettes    Quit date: 07/15/2015  . Smokeless tobacco: Never Used  . Alcohol use No  . Drug use: No  . Sexual activity: No   Other Topics Concern  . Not on file   Social History Narrative   Patient is right handed.   Patient drinks 2 cups caffeine daily.    ECOG Status: 0 - Asymptomatic  Review of Systems: A 12 point ROS discussed and pertinent positives are indicated in the HPI above.  All other systems are negative.  Review of Systems  Vital Signs: BP 135/76 (BP Location: Left Arm, Patient Position: Sitting, Cuff Size: Large)   Pulse 92   Temp 98.2 F (36.8 C) (Oral)   Resp 17   Ht 5' 3.25" (1.607 m)   Wt 211 lb (95.7 kg)   SpO2 100%   BMI 37.08 kg/m   Physical Exam  Mallampati Score:     Imaging: Ct Guide Tissue Ablation  Result Date: 06/27/2016 INDICATION: 65 year old female with a history of left renal mass, presumably renal cell carcinoma. She presents for ablation EXAM: CT-GUIDED RENAL CRYOABLATION COMPARISON:  MR 04/10/2016 MEDICATIONS: 2.0 g Ancef; The antibiotic was  administered in an appropriate time interval prior to needle puncture of the skin. ANESTHESIA/SEDATION: General - as administered by the Anesthesia department The patient was continuously monitored during the procedure by the anesthesia team. FLUOROSCOPY TIME:  CT COMPLICATIONS: None TECHNIQUE: Informed written consent was obtained from the patient after a thorough discussion of the procedural risks, benefits and alternatives. All questions were addressed. Maximal Sterile Barrier Technique was utilized including caps, mask, sterile gowns, sterile gloves, sterile drape, hand hygiene and skin antiseptic. A timeout was performed prior to the initiation of the procedure. Once the anesthesia team had induced general anesthesia, the patient was positioned in the prone position and a scout CT of the abdomen was performed for planning purposes. The patient was then prepped and draped in usual sterile fashion overlying the left flank. Initially CT guidance was used to place a series of Chiba localization needles, positioned as a hydro dissection tool and targeting needles into the perirenal fat at the superior aspect of the left kidney. Tandem Galil IceForce cryoablation probes were selected, using the localization needles for targeting to the left upper pole kidney tumor. Once the cryoablation probes were satisfactory aligned with the tumor, a biopsy needle was inserted into the same plane, targeting the tumor. A contrast-enhanced CT was then acquired, with half dose contrast to assure positioning. Hydro dissection was then performed using the Chiba needle between the superior pole of the kidney/tumor and the spleen. Approximately 20 cc of 10% dilute contrast solution (5cc contrast in 500cc saline) was infused. CT guidance was then used to place the cryoablation probes through the tumor tissue. A percutaneous biopsy was then performed with 2 separate 18 gauge core biopsy through a 17 gauge guide needle. Once the probe  position was satisfactory, the first freezing cycle was performed with 10 minutes of freezing time. A repeat CT was performed at 5 minutes of freezing time. A passive thawing cycle was then performed. The second freezing cycle was performed with no adjustments necessary. A full 10 minutes treatment was performed. After a second passive thaw, needle tract cautery  was performed, and the needles removed. Final CT was performed. Patient remained hemodynamically stable throughout. No complications encountered and no significant blood loss encountered. The patient was then extubated and transported in stable condition to PACU FINDINGS: Initial scout CT demonstrates superior pole left kidney tumor, not significantly increased in size from prior. Sequential CT imaging during the case demonstrates safe cryo probe placement through the tumor with 1 cm of distance between probes. Could hydro dissection plane was achieved with infusion of 20 cc of dilute contrast material. Final ice ball achieved satisfactory coverage of the tissue at the superior pole of the left kidney. Final CT image demonstrates no complicating features. IMPRESSION: Status post CT-guided cryoablation of superior pole left renal tumor, after a targeted biopsy. Signed, Dulcy Fanny. Earleen Newport, DO [facility] Radiology Electronically Signed   By: Corrie Mckusick D.O.   On: 06/27/2016 07:49   Ct Biopsy  Result Date: 06/27/2016 INDICATION: 65 year old female with a history of left renal mass, presumably renal cell carcinoma. She presents for ablation EXAM: CT-GUIDED RENAL CRYOABLATION COMPARISON:  MR 04/10/2016 MEDICATIONS: 2.0 g Ancef; The antibiotic was administered in an appropriate time interval prior to needle puncture of the skin. ANESTHESIA/SEDATION: General - as administered by the Anesthesia department The patient was continuously monitored during the procedure by the anesthesia team. FLUOROSCOPY TIME:  CT  COMPLICATIONS: None TECHNIQUE: Informed written consent was obtained from the patient after a thorough discussion of the procedural risks, benefits and alternatives. All questions were addressed. Maximal Sterile Barrier Technique was utilized including caps, mask, sterile gowns, sterile gloves, sterile drape, hand hygiene and skin antiseptic. A timeout was performed prior to the initiation of the procedure. Once the anesthesia team had induced general anesthesia, the patient was positioned in the prone position and a scout CT of the abdomen was performed for planning purposes. The patient was then prepped and draped in usual sterile fashion overlying the left flank. Initially CT guidance was used to place a series of Chiba localization needles, positioned as a hydro dissection tool and targeting needles into the perirenal fat at the superior aspect of the left kidney. Tandem Galil IceForce cryoablation probes were selected, using the localization needles for targeting to the left upper pole kidney tumor. Once the cryoablation probes were satisfactory aligned with the tumor, a biopsy needle was inserted into the same plane, targeting the tumor. A contrast-enhanced CT was then acquired, with half dose contrast to assure positioning. Hydro dissection was then performed using the Chiba needle between the superior pole of the kidney/tumor and the spleen. Approximately 20 cc of 10% dilute contrast solution (5cc contrast in 500cc saline) was infused. CT guidance was then used to place the cryoablation probes through the tumor tissue. A percutaneous biopsy was then performed with 2 separate 18 gauge core biopsy through a 17 gauge guide needle. Once the probe position was satisfactory, the first freezing cycle was performed with 10 minutes of freezing time. A repeat CT was performed at 5 minutes of freezing time. A passive thawing cycle was then performed. The second freezing cycle was performed with no adjustments  necessary. A full 10 minutes treatment was performed. After a second passive thaw, needle tract cautery was performed, and the needles removed. Final CT was performed. Patient remained hemodynamically stable throughout. No complications encountered and no significant blood loss encountered. The patient was then extubated and transported in stable condition to PACU FINDINGS: Initial scout CT demonstrates superior pole left kidney tumor, not significantly  increased in size from prior. Sequential CT imaging during the case demonstrates safe cryo probe placement through the tumor with 1 cm of distance between probes. Could hydro dissection plane was achieved with infusion of 20 cc of dilute contrast material. Final ice ball achieved satisfactory coverage of the tissue at the superior pole of the left kidney. Final CT image demonstrates no complicating features. IMPRESSION: Status post CT-guided cryoablation of superior pole left renal tumor, after a targeted biopsy. Signed, Dulcy Fanny. Earleen Newport, DO [facility] Radiology Electronically Signed   By: Corrie Mckusick D.O.   On: 06/27/2016 07:49    Labs:  CBC:  Recent Labs  02/11/16 1046 03/19/16 1129 05/21/16 1205 06/23/16 1215  WBC 8.1 7.3 9.3 6.8  HGB 11.3* 11.6* 11.7* 12.2  HCT 35.9* 35.9* 35.6* 37.9  PLT 347 344.0 343 341    COAGS:  Recent Labs  05/21/16 1205 06/23/16 1215  INR 0.92 0.98  APTT 37 35    BMP:  Recent Labs  02/11/16 1046 03/19/16 1129 05/21/16 1205 06/23/16 1215 06/26/16 1005  NA 140 138 138 141 138  K 5.2* 4.7 4.9 6.1* 4.7  CL 107 107 106 109 108  CO2 21* _0 GLUCOSE 201* 156* 196* 85 126*  BUN 18 23 24* 16 23*  CALCIUM 9.9 10.5 10.4* 10.5* 10.0  CREATININE 1.17* 1.41* 1.43* 1.53* 1.61*  GFRNONAA 48*  --  38* 35* 33*  GFRAA 55*  --  43* 40* 38*    LIVER FUNCTION TESTS:  Recent Labs  11/07/15 0230 11/08/15 0357 03/19/16 1129  BILITOT 0.5 0.6 0.3    AST _1 ALT 15 13* 12  ALKPHOS 59 55 75  PROT 7.1 6.9 7.7  ALBUMIN 3.6 3.7 4.4    TUMOR MARKERS: No results for input(s): AFPTM, CEA, CA199, CHROMGRNA in the last 8760 hours.  Assessment and Plan:  Ms Pence is approximately 2 weeks status post cryoablation of a stage I renal cell carcinoma left upper pole kidney. She is doing quite well.  Our first follow-up imaging will be 3 months after her procedure, a proximally October 15 with contrast-enhanced abdominal CT.  I have encouraged her to return to all of her usual activities, including swimming which she enjoys area there is no concern for infection at her surgical site.  I've encouraged her to observe all of her other physicians appointments, including urology with Dr. Erlene Quan.  We'll schedule her for upcoming contrast-enhanced CT in October, as well as follow-up at that time.     Electronically Signed: Corrie Mckusick 07/16/2016, 5:39 PM   I spent a total of    15 Minutes in face to face in clinical consultation, greater than 50% of which was counseling/coordinating care for left stage IA renal cell carcinoma, clear cell, status post cryoablation.

## 2016-07-31 ENCOUNTER — Telehealth: Payer: Self-pay

## 2016-07-31 NOTE — Telephone Encounter (Signed)
Pt requesting an rx for test strips for her aviva accu check. [facility] on [address] in [place].

## 2016-08-04 MED ORDER — GLUCOSE BLOOD VI STRP: 1.0000 | ORAL_STRIP | Freq: Two times a day (BID) | 5 refills | Status: DC

## 2016-08-04 NOTE — Telephone Encounter (Signed)
Rx sent through e-scribe

## 2016-08-04 NOTE — Addendum Note (Signed)
Addended by: Lurlean Nanny on: 08/04/2016 01:50 PM   Modules accepted: Orders

## 2016-08-13 DIAGNOSIS — H401132 Primary open-angle glaucoma, bilateral, moderate stage: Secondary | ICD-10-CM | POA: Diagnosis not present

## 2016-08-19 ENCOUNTER — Ambulatory Visit: Payer: Medicare Other | Admitting: Podiatry

## 2016-08-25 ENCOUNTER — Encounter: Payer: Self-pay | Admitting: Podiatry

## 2016-08-25 ENCOUNTER — Ambulatory Visit (INDEPENDENT_AMBULATORY_CARE_PROVIDER_SITE_OTHER): Payer: Medicare Other | Admitting: Podiatry

## 2016-08-25 DIAGNOSIS — M79676 Pain in unspecified toe(s): Secondary | ICD-10-CM | POA: Diagnosis not present

## 2016-08-25 DIAGNOSIS — B351 Tinea unguium: Secondary | ICD-10-CM

## 2016-08-25 NOTE — Progress Notes (Signed)
   Subjective:    Patient ID: Elizabeth Thompson, female    DOB: 05/13/1951, 65 y.o.   MRN: [id number]  HPI this patient presents to the office with chief complaint of painful long thick nails. Patient states the nails are painful as she walks and wears her shoes. Patient stayed states that she is a diabetic without any complications. She presents the office for preventive foot care services.  Patient has been treated for cancer in kidney.    Review of Systems  Constitutional: Positive for activity change.  HENT:       Sinus problems  Respiratory: Positive for cough.   Gastrointestinal: Positive for diarrhea.  Genitourinary: Positive for urgency.  Musculoskeletal: Positive for back pain and gait problem.  Skin:       Change in nails  Allergic/Immunologic: Positive for environmental allergies.  Neurological: Positive for tremors.       Objective:   Physical Exam GENERAL APPEARANCE: Alert, conversant. Appropriately groomed. No acute distress.  VASCULAR: Pedal pulses palpable at  DP and PT bilateral.  Capillary refill time is immediate to all digits,  Normal temperature gradient.  Digital hair growth is present bilateral  NEUROLOGIC: sensation is normal to 5.07 monofilament at 5/5 sites bilateral.  Light touch is intact bilateral, Muscle strength normal.  MUSCULOSKELETAL: acceptable muscle strength, tone and stability bilateral.  Intrinsic muscluature intact bilateral.  Rectus appearance of foot and digits noted bilateral.   DERMATOLOGIC: skin color, texture, and turgor are within normal limits.  No preulcerative lesions or ulcers  are seen, no interdigital maceration noted.  No open lesions present.  . No drainage noted.  NAILS  Thick disfigured discolored nails 2-5 B/L       Assessment & Plan:  Onychomycosis B/L   Diabetes with no complications.  IE  Debridement of nails B/L  RTC 3 months   Tehani Mersman DPM 

## 2016-09-04 ENCOUNTER — Encounter (HOSPITAL_COMMUNITY): Payer: Self-pay

## 2016-09-04 ENCOUNTER — Ambulatory Visit (INDEPENDENT_AMBULATORY_CARE_PROVIDER_SITE_OTHER): Payer: Medicare Other | Admitting: Adult Health

## 2016-09-04 ENCOUNTER — Inpatient Hospital Stay (HOSPITAL_COMMUNITY)
Admission: EM | Admit: 2016-09-04 | Discharge: 2016-09-08 | DRG: 189 | Disposition: A | Discharge status: Home / Self Care | Payer: Medicare Other | Attending: Internal Medicine | Admitting: Internal Medicine

## 2016-09-04 ENCOUNTER — Encounter: Payer: Self-pay | Admitting: Adult Health

## 2016-09-04 ENCOUNTER — Emergency Department (HOSPITAL_COMMUNITY): Payer: Medicare Other

## 2016-09-04 ENCOUNTER — Inpatient Hospital Stay: Admission: AD | Admit: 2016-09-04 | Payer: Medicare Other | Source: Ambulatory Visit | Admitting: Internal Medicine

## 2016-09-04 VITALS — HR 84 | Temp 98.2°F | Ht 63.0 in | Wt 213.0 lb

## 2016-09-04 DIAGNOSIS — H409 Unspecified glaucoma: Secondary | ICD-10-CM | POA: Diagnosis present

## 2016-09-04 DIAGNOSIS — E785 Hyperlipidemia, unspecified: Secondary | ICD-10-CM | POA: Diagnosis present

## 2016-09-04 DIAGNOSIS — I10 Essential (primary) hypertension: Secondary | ICD-10-CM | POA: Diagnosis present

## 2016-09-04 DIAGNOSIS — Z7984 Long term (current) use of oral hypoglycemic drugs: Secondary | ICD-10-CM

## 2016-09-04 DIAGNOSIS — Z888 Allergy status to other drugs, medicaments and biological substances status: Secondary | ICD-10-CM | POA: Diagnosis not present

## 2016-09-04 DIAGNOSIS — F319 Bipolar disorder, unspecified: Secondary | ICD-10-CM | POA: Diagnosis present

## 2016-09-04 DIAGNOSIS — Z86010 Personal history of colonic polyps: Secondary | ICD-10-CM

## 2016-09-04 DIAGNOSIS — Z820 Family history of epilepsy and other diseases of the nervous system: Secondary | ICD-10-CM | POA: Diagnosis not present

## 2016-09-04 DIAGNOSIS — R1030 Lower abdominal pain, unspecified: Secondary | ICD-10-CM | POA: Diagnosis not present

## 2016-09-04 DIAGNOSIS — Z833 Family history of diabetes mellitus: Secondary | ICD-10-CM

## 2016-09-04 DIAGNOSIS — J441 Chronic obstructive pulmonary disease with (acute) exacerbation: Secondary | ICD-10-CM | POA: Diagnosis present

## 2016-09-04 DIAGNOSIS — R52 Pain, unspecified: Secondary | ICD-10-CM

## 2016-09-04 DIAGNOSIS — R0602 Shortness of breath: Secondary | ICD-10-CM | POA: Diagnosis not present

## 2016-09-04 DIAGNOSIS — Z6837 Body mass index (BMI) 37.0-37.9, adult: Secondary | ICD-10-CM | POA: Diagnosis not present

## 2016-09-04 DIAGNOSIS — Z885 Allergy status to narcotic agent status: Secondary | ICD-10-CM | POA: Diagnosis not present

## 2016-09-04 DIAGNOSIS — E669 Obesity, unspecified: Secondary | ICD-10-CM | POA: Diagnosis present

## 2016-09-04 DIAGNOSIS — Z9981 Dependence on supplemental oxygen: Secondary | ICD-10-CM

## 2016-09-04 DIAGNOSIS — F32A Depression, unspecified: Secondary | ICD-10-CM | POA: Diagnosis present

## 2016-09-04 DIAGNOSIS — F250 Schizoaffective disorder, bipolar type: Secondary | ICD-10-CM | POA: Diagnosis not present

## 2016-09-04 DIAGNOSIS — F259 Schizoaffective disorder, unspecified: Secondary | ICD-10-CM | POA: Diagnosis present

## 2016-09-04 DIAGNOSIS — Z7951 Long term (current) use of inhaled steroids: Secondary | ICD-10-CM

## 2016-09-04 DIAGNOSIS — J9621 Acute and chronic respiratory failure with hypoxia: Secondary | ICD-10-CM | POA: Diagnosis not present

## 2016-09-04 DIAGNOSIS — F251 Schizoaffective disorder, depressive type: Secondary | ICD-10-CM | POA: Diagnosis present

## 2016-09-04 DIAGNOSIS — F329 Major depressive disorder, single episode, unspecified: Secondary | ICD-10-CM | POA: Diagnosis present

## 2016-09-04 DIAGNOSIS — R0600 Dyspnea, unspecified: Secondary | ICD-10-CM | POA: Diagnosis not present

## 2016-09-04 DIAGNOSIS — K5900 Constipation, unspecified: Secondary | ICD-10-CM | POA: Diagnosis present

## 2016-09-04 DIAGNOSIS — Z79899 Other long term (current) drug therapy: Secondary | ICD-10-CM

## 2016-09-04 DIAGNOSIS — C649 Malignant neoplasm of unspecified kidney, except renal pelvis: Secondary | ICD-10-CM | POA: Diagnosis present

## 2016-09-04 DIAGNOSIS — J449 Chronic obstructive pulmonary disease, unspecified: Secondary | ICD-10-CM | POA: Diagnosis present

## 2016-09-04 DIAGNOSIS — E119 Type 2 diabetes mellitus without complications: Secondary | ICD-10-CM | POA: Diagnosis present

## 2016-09-04 DIAGNOSIS — E872 Acidosis: Secondary | ICD-10-CM | POA: Diagnosis present

## 2016-09-04 DIAGNOSIS — Z905 Acquired absence of kidney: Secondary | ICD-10-CM

## 2016-09-04 DIAGNOSIS — Z87891 Personal history of nicotine dependence: Secondary | ICD-10-CM

## 2016-09-04 DIAGNOSIS — Z8249 Family history of ischemic heart disease and other diseases of the circulatory system: Secondary | ICD-10-CM

## 2016-09-04 DIAGNOSIS — K219 Gastro-esophageal reflux disease without esophagitis: Secondary | ICD-10-CM | POA: Diagnosis not present

## 2016-09-04 LAB — GLUCOSE, CAPILLARY: Glucose-Capillary: 176 mg/dL — ABNORMAL HIGH (ref 65–99)

## 2016-09-04 LAB — BLOOD GAS, VENOUS
ACID-BASE DEFICIT: 5.6 mmol/L — AB (ref 0.0–2.0)
BICARBONATE: 19.7 mmol/L — AB (ref 20.0–28.0)
O2 Saturation: 61.5 %
PCO2 VEN: 40.1 mmHg — AB (ref 44.0–60.0)
PH VEN: 7.312 (ref 7.250–7.430)
PO2 VEN: 35.9 mmHg (ref 32.0–45.0)
Patient temperature: 98.6

## 2016-09-04 LAB — BASIC METABOLIC PANEL
Anion gap: 8 (ref 5–15)
BUN: 33 mg/dL — AB (ref 6–20)
CO2: 21 mmol/L — ABNORMAL LOW (ref 22–32)
CREATININE: 1.63 mg/dL — AB (ref 0.44–1.00)
Calcium: 10.4 mg/dL — ABNORMAL HIGH (ref 8.9–10.3)
Chloride: 111 mmol/L (ref 101–111)
GFR, EST AFRICAN AMERICAN: 37 mL/min — AB (ref >60)
GFR, EST NON AFRICAN AMERICAN: 32 mL/min — AB (ref >60)
Glucose, Bld: 114 mg/dL — ABNORMAL HIGH (ref 65–99)
POTASSIUM: 5.3 mmol/L — AB (ref 3.5–5.1)
SODIUM: 140 mmol/L (ref 135–145)

## 2016-09-04 LAB — CBC
HCT: 37.2 % (ref 36.0–46.0)
Hemoglobin: 11.9 g/dL — ABNORMAL LOW (ref 12.0–15.0)
MCH: 27.6 pg (ref 26.0–34.0)
MCHC: 32 g/dL (ref 30.0–36.0)
MCV: 86.3 fL (ref 78.0–100.0)
PLATELETS: 358 10*3/uL (ref 150–400)
RBC: 4.31 MIL/uL (ref 3.87–5.11)
RDW: 14.7 % (ref 11.5–15.5)
WBC: 7.4 10*3/uL (ref 4.0–10.5)

## 2016-09-04 LAB — I-STAT TROPONIN, ED: Troponin i, poc: 0.01 ng/mL (ref 0.00–0.08)

## 2016-09-04 LAB — D-DIMER, QUANTITATIVE (NOT AT ARMC): D DIMER QUANT: 1.67 ug{FEU}/mL — AB (ref 0.00–0.50)

## 2016-09-04 LAB — BRAIN NATRIURETIC PEPTIDE: B NATRIURETIC PEPTIDE 5: 15.4 pg/mL (ref 0.0–100.0)

## 2016-09-04 MED ORDER — LATANOPROST 0.005 % OP SOLN
1.0000 [drp] | Freq: Every day | OPHTHALMIC | Status: DC
  Administered 2016-09-05 – 2016-09-07 (×4): 1 [drp] via OPHTHALMIC
  Filled 2016-09-04: qty 2.5

## 2016-09-04 MED ORDER — ACETAMINOPHEN 325 MG PO TABS: 650.0000 mg | ORAL_TABLET | Freq: Four times a day (QID) | ORAL | Status: DC | PRN

## 2016-09-04 MED ORDER — IPRATROPIUM-ALBUTEROL 0.5-2.5 (3) MG/3ML IN SOLN
3.0000 mL | Freq: Three times a day (TID) | RESPIRATORY_TRACT | Status: DC
  Administered 2016-09-05 – 2016-09-08 (×11): 3 mL via RESPIRATORY_TRACT
  Filled 2016-09-04 (×11): qty 3

## 2016-09-04 MED ORDER — MOMETASONE FURO-FORMOTEROL FUM 200-5 MCG/ACT IN AERO
2.0000 | INHALATION_SPRAY | Freq: Two times a day (BID) | RESPIRATORY_TRACT | Status: DC
  Administered 2016-09-05 – 2016-09-08 (×7): 2 via RESPIRATORY_TRACT
  Filled 2016-09-04: qty 8.8

## 2016-09-04 MED ORDER — IPRATROPIUM-ALBUTEROL 0.5-2.5 (3) MG/3ML IN SOLN
3.0000 mL | RESPIRATORY_TRACT | Status: DC
  Administered 2016-09-04: 3 mL via RESPIRATORY_TRACT
  Filled 2016-09-04: qty 3

## 2016-09-04 MED ORDER — SIMVASTATIN 40 MG PO TABS
40.0000 mg | ORAL_TABLET | Freq: Every day | ORAL | Status: DC
  Administered 2016-09-04 – 2016-09-08 (×5): 40 mg via ORAL
  Filled 2016-09-04 (×5): qty 1

## 2016-09-04 MED ORDER — ENOXAPARIN SODIUM 40 MG/0.4ML ~~LOC~~ SOLN
40.0000 mg | Freq: Every day | SUBCUTANEOUS | Status: DC
  Administered 2016-09-04 – 2016-09-06 (×3): 40 mg via SUBCUTANEOUS
  Filled 2016-09-04 (×3): qty 0.4

## 2016-09-04 MED ORDER — METHOCARBAMOL 500 MG PO TABS: 500.0000 mg | ORAL_TABLET | Freq: Three times a day (TID) | ORAL | Status: DC | PRN

## 2016-09-04 MED ORDER — MULTIVITAMINS PO CAPS: 1.0000 | ORAL_CAPSULE | Freq: Every day | ORAL | Status: DC

## 2016-09-04 MED ORDER — SENNA 8.6 MG PO TABS
1.0000 | ORAL_TABLET | Freq: Every day | ORAL | Status: DC
  Administered 2016-09-05 – 2016-09-08 (×4): 8.6 mg via ORAL
  Filled 2016-09-04 (×5): qty 1

## 2016-09-04 MED ORDER — DONEPEZIL HCL 5 MG PO TABS
5.0000 mg | ORAL_TABLET | Freq: Every day | ORAL | Status: DC
  Administered 2016-09-04 – 2016-09-07 (×4): 5 mg via ORAL
  Filled 2016-09-04 (×4): qty 1

## 2016-09-04 MED ORDER — SODIUM CHLORIDE 0.9 % IV BOLUS (SEPSIS)
500.0000 mL | Freq: Once | INTRAVENOUS | Status: AC
  Administered 2016-09-04: 500 mL via INTRAVENOUS

## 2016-09-04 MED ORDER — ALBUTEROL SULFATE (2.5 MG/3ML) 0.083% IN NEBU: 5.0000 mg | INHALATION_SOLUTION | RESPIRATORY_TRACT | Status: DC | PRN

## 2016-09-04 MED ORDER — ARIPIPRAZOLE 10 MG PO TABS
20.0000 mg | ORAL_TABLET | Freq: Every day | ORAL | Status: DC
  Administered 2016-09-04 – 2016-09-07 (×4): 20 mg via ORAL
  Filled 2016-09-04 (×3): qty 2
  Filled 2016-09-04: qty 4
  Filled 2016-09-04 (×2): qty 2
  Filled 2016-09-04 (×3): qty 4

## 2016-09-04 MED ORDER — ADULT MULTIVITAMIN W/MINERALS CH
1.0000 | ORAL_TABLET | Freq: Every day | ORAL | Status: DC
  Administered 2016-09-05 – 2016-09-08 (×4): 1 via ORAL
  Filled 2016-09-04 (×5): qty 1

## 2016-09-04 MED ORDER — ONDANSETRON HCL 4 MG PO TABS
4.0000 mg | ORAL_TABLET | Freq: Three times a day (TID) | ORAL | Status: DC | PRN
  Filled 2016-09-04: qty 1

## 2016-09-04 MED ORDER — MIRTAZAPINE 7.5 MG PO TABS
7.5000 mg | ORAL_TABLET | Freq: Every day | ORAL | Status: DC
  Administered 2016-09-05 – 2016-09-07 (×4): 7.5 mg via ORAL
  Filled 2016-09-04 (×5): qty 1

## 2016-09-04 MED ORDER — HYDROXYZINE HCL 25 MG PO TABS
25.0000 mg | ORAL_TABLET | Freq: Every day | ORAL | Status: DC
  Administered 2016-09-04 – 2016-09-07 (×4): 25 mg via ORAL
  Filled 2016-09-04 (×4): qty 1

## 2016-09-04 MED ORDER — VITAMIN D3 25 MCG (1000 UNIT) PO TABS
1000.0000 [IU] | ORAL_TABLET | Freq: Every day | ORAL | Status: DC
  Administered 2016-09-05 – 2016-09-08 (×4): 1000 [IU] via ORAL
  Filled 2016-09-04 (×4): qty 1

## 2016-09-04 MED ORDER — SODIUM CHLORIDE 0.9 % IV SOLN
INTRAVENOUS | Status: DC
  Administered 2016-09-05: 100 mL/h via INTRAVENOUS
  Administered 2016-09-05 – 2016-09-06 (×4): via INTRAVENOUS

## 2016-09-04 MED ORDER — MENTHOL (TOPICAL ANALGESIC) 10 % EX LIQD: 1.0000 "application " | CUTANEOUS | Status: DC | PRN

## 2016-09-04 MED ORDER — GRX ANALGESIC BALM EX OINT
1.0000 "application " | TOPICAL_OINTMENT | CUTANEOUS | Status: DC | PRN
  Filled 2016-09-04: qty 28

## 2016-09-04 MED ORDER — PANTOPRAZOLE SODIUM 40 MG PO TBEC
40.0000 mg | DELAYED_RELEASE_TABLET | Freq: Every day | ORAL | Status: DC
  Administered 2016-09-05 – 2016-09-08 (×4): 40 mg via ORAL
  Filled 2016-09-04 (×5): qty 1

## 2016-09-04 MED ORDER — TECHNETIUM TC 99M DIETHYLENETRIAME-PENTAACETIC ACID: 32.7000 | Freq: Once | INTRAVENOUS | Status: DC | PRN

## 2016-09-04 MED ORDER — DORZOLAMIDE HCL-TIMOLOL MAL 2-0.5 % OP SOLN
1.0000 [drp] | Freq: Two times a day (BID) | OPHTHALMIC | Status: DC
  Administered 2016-09-05 – 2016-09-08 (×8): 1 [drp] via OPHTHALMIC
  Filled 2016-09-04: qty 10

## 2016-09-04 MED ORDER — ZOLPIDEM TARTRATE 5 MG PO TABS
5.0000 mg | ORAL_TABLET | Freq: Every evening | ORAL | Status: DC | PRN
Start: 2016-09-04 — End: 2016-09-08

## 2016-09-04 MED ORDER — LAMOTRIGINE 100 MG PO TABS
100.0000 mg | ORAL_TABLET | Freq: Every day | ORAL | Status: DC
  Administered 2016-09-04 – 2016-09-08 (×5): 100 mg via ORAL
  Filled 2016-09-04 (×5): qty 1

## 2016-09-04 MED ORDER — TECHNETIUM TO 99M ALBUMIN AGGREGATED
4.4000 | Freq: Once | INTRAVENOUS | Status: AC | PRN
  Administered 2016-09-04: 4.4 via INTRAVENOUS

## 2016-09-04 MED ORDER — IRBESARTAN 300 MG PO TABS
300.0000 mg | ORAL_TABLET | Freq: Every day | ORAL | Status: DC
  Administered 2016-09-04 – 2016-09-08 (×5): 300 mg via ORAL
  Filled 2016-09-04: qty 2
  Filled 2016-09-04 (×3): qty 1
  Filled 2016-09-04 (×2): qty 2
  Filled 2016-09-04 (×2): qty 1
  Filled 2016-09-04 (×2): qty 2

## 2016-09-04 MED ORDER — ALBUTEROL SULFATE (2.5 MG/3ML) 0.083% IN NEBU
5.0000 mg | INHALATION_SOLUTION | Freq: Once | RESPIRATORY_TRACT | Status: AC
  Administered 2016-09-04: 5 mg via RESPIRATORY_TRACT
  Filled 2016-09-04: qty 6

## 2016-09-04 MED ORDER — SODIUM POLYSTYRENE SULFONATE 15 GM/60ML PO SUSP
15.0000 g | Freq: Once | ORAL | Status: AC
Start: 2016-09-04 — End: 2016-09-04
  Administered 2016-09-04: 15 g via ORAL
  Filled 2016-09-04: qty 60

## 2016-09-04 NOTE — ED Notes (Signed)
NM called to inform staff procedure approximately in an hour.

## 2016-09-04 NOTE — ED Provider Notes (Signed)
[facility] DEPT Provider Note   CSN: [id number] Arrival date & time: 09/04/16  1222     History   Chief Complaint Chief Complaint  Patient presents with  . Shortness of Breath    HPI Elizabeth Thompson is a 65 y.o. female.  HPI   Patient is a 65 year female the history of COPD on home oxygen therapy, bipolar, DM who presents the emergency department with worsening shortness of breath for 3 weeks. Niece the caregiver was also the historian. She states patient's O2 saturations were dropping into the 80s during the day. She gives the patient's home nebulizer treatments. Normally the patient would only use oxygen at night.  [name] baseline room air O2 sats are between 90-92. Over the last week that nieces noticed patient's sats dropping in the 64s upon exertion. Pt has been needing O2 during the day. Patient states productive cough of thick white sputum. Patient states her nephew was diagnosed with pneumonia.  Patient denies chest pain, shortness of breath, headache, dizziness, syncope.  Past Medical History:  Diagnosis Date  . Arthritis   . Back pain   . Bell's palsy   . Bipolar disorder ([place])   . Bronchitis   . Chronic low back pain 05/09/2015  . COPD (chronic obstructive pulmonary disease) ([place])    hyoxia during sleep- using O2 as needed  . Cough with expectoration 05/21/16   recent diagnosis of bronchitis  . Cyst of right kidney   . Diabetes mellitus without complication ([place])    type 2  . Frequency of urination   . GERD (gastroesophageal reflux disease)   . Glaucoma   . History of blood transfusion   . History of colon polyps   . History of hiatal hernia   . History of tobacco use   . Hypercalcemia   . Hyperlipidemia   . Hypertension   . Memory disorder 09/05/2014  . On home oxygen therapy    3L/M [place] at night   . Schizo-affective psychosis ([place])   . Shortness of breath dyspnea    exertion   . Uterine fibroid     Patient Active Problem List   Diagnosis Date Noted   . Renal mass, left 06/26/2016  . Renal malignant tumor ([place]) 06/26/2016  . GERD (gastroesophageal reflux disease) 03/22/2016  . Insomnia 03/22/2016  . COPD GOLD 0  12/06/2015  . Essential hypertension 12/06/2015  . Multiple lung nodules 11/20/2015  . Hypersomnia with sleep apnea 11/20/2015  . Diabetes mellitus type 2, controlled, without complications ([place]) 25/42/7062  . HLD (hyperlipidemia) 11/06/2015  . Chronic low back pain 05/09/2015  . Gait disorder 05/09/2015  . Hypercalcemia 09/27/2014  . Memory disorder 09/05/2014  . Schizoaffective disorder ([place]) 08/18/2014    Past Surgical History:  Procedure Laterality Date  . ABDOMINAL HYSTERECTOMY    . APPENDECTOMY    . COLONOSCOPY W/ POLYPECTOMY    . LUMBAR LAMINECTOMY/DECOMPRESSION MICRODISCECTOMY N/A 08/14/2015   Procedure: LUMBAR DECOMPRESSION MICRODISCECTOMY L3-S1;  Surgeon: Melina Schools, MD;  Location: [facility];  Service: Orthopedics;  Laterality: N/A;  . TONSILLECTOMY AND ADENOIDECTOMY      OB History    No data available       Home Medications    Prior to Admission medications   Medication Sig Start Date End Date Taking? Authorizing Provider  albuterol (PROVENTIL HFA;VENTOLIN HFA) 108 (90 BASE) MCG/ACT inhaler Inhale 2 puffs into the lungs every 6 (six) hours as needed for wheezing or shortness of breath. 08/27/14   Kerrie Buffalo,  NP  albuterol (PROVENTIL) (2.5 MG/3ML) 0.083% nebulizer solution Take 2.5 mg by nebulization every 6 (six) hours as needed for wheezing or shortness of breath.    Historical Provider, MD  ARIPiprazole (ABILIFY) 20 MG tablet Take 1 tablet (20 mg total) by mouth at bedtime. 08/27/14   Kerrie Buffalo, NP  benzonatate (TESSALON) 100 MG capsule Take 1 capsule (100 mg total) by mouth 2 (two) times daily as needed for cough. 06/01/16   Jearld Fenton, NP  budesonide-formoterol [facility]) 160-4.5 MCG/ACT inhaler Inhale 2 puffs into the lungs 2 (two) times daily.    Historical Provider, MD    Cholecalciferol (VITAMIN D3 PO) Take 1 tablet by mouth daily.    Historical Provider, MD  Cimetidine (TAGAMET PO) Take 2 tablets by mouth daily.    Historical Provider, MD  donepezil (ARICEPT) 5 MG tablet Take 1 tablet (5 mg total) by mouth at bedtime. 08/27/14   Kerrie Buffalo, NP  dorzolamide-timolol (COSOPT) 22.3-6.8 MG/ML ophthalmic solution Place 1 drop into both eyes 2 (two) times daily.    Historical Provider, MD  esomeprazole (NEXIUM) 20 MG capsule Take 20 mg by mouth daily as needed (For heartburn or acid reflux.).     Historical Provider, MD  glimepiride (AMARYL) 4 MG tablet Take 1 tablet (4 mg total) by mouth daily with breakfast. 08/27/14   Kerrie Buffalo, NP  glucose blood (ACCU-CHEK AVIVA) test strip 1 each by Other route 2 (two) times daily. 08/04/16   Jearld Fenton, NP  hydrOXYzine (ATARAX/VISTARIL) 25 MG tablet Take 25 mg by mouth at bedtime as needed for anxiety.  04/17/16   Historical Provider, MD  ibuprofen (ADVIL,MOTRIN) 200 MG tablet Take 600 mg by mouth every 6 (six) hours as needed (For pain.).    Historical Provider, MD  Ipratropium-Albuterol (COMBIVENT) 20-100 MCG/ACT AERS respimat Inhale 1 puff into the lungs every 6 (six) hours.    Historical Provider, MD  irbesartan (AVAPRO) 300 MG tablet Take 1 tablet (300 mg total) by mouth daily. 12/05/15   Tanda Rockers, MD  lamoTRIgine (LAMICTAL) 100 MG tablet Take 1 tablet (100 mg total) by mouth every evening. 08/27/14   Kerrie Buffalo, NP  latanoprost (XALATAN) 0.005 % ophthalmic solution Place 1 drop into both eyes at bedtime. 08/27/14   Kerrie Buffalo, NP  Menthol, Topical Analgesic, (BIOFREEZE) 10 % LIQD Apply 1 application topically as needed.    Historical Provider, MD  metFORMIN (GLUCOPHAGE) 1000 MG tablet Take 1 tablet (1,000 mg total) by mouth 2 (two) times daily with a meal. 08/27/14   Kerrie Buffalo, NP  methocarbamol (ROBAXIN) 500 MG tablet Take 1 tablet (500 mg total) by mouth 3 (three) times daily as needed for muscle  spasms. 08/14/15   Melina Schools, MD  mirtazapine (REMERON) 7.5 MG tablet Take 1 tablet (7.5 mg total) by mouth at bedtime. 08/27/14   Kerrie Buffalo, NP  Multiple Vitamin (MULTIVITAMIN) capsule Take 1 capsule by mouth daily.    Historical Provider, MD  ondansetron (ZOFRAN) 4 MG tablet Take 1 tablet (4 mg total) by mouth every 8 (eight) hours as needed for nausea or vomiting. 08/14/15   Melina Schools, MD  OXYGEN Inhale 3 L into the lungs at bedtime.     Historical Provider, MD  Selenium Sulf-Pyrithione-Urea 2.25 % SHAM Apply 1 application topically as needed. Applied to scalp for dermatitis 01/25/15   Historical Provider, MD  senna (SENOKOT) 8.6 MG tablet Take 1 tablet by mouth daily.    Historical Provider, MD  simvastatin (  ZOCOR) 40 MG tablet Take 1 tablet (40 mg total) by mouth daily. Patient taking differently: Take 40 mg by mouth every evening.  08/27/14   Kerrie Buffalo, NP    Family History Family History  Problem Relation Age of Onset  . Dementia Mother   . Alzheimer's disease Mother   . Heart disease Mother   . Hypertension Mother   . Diabetes Mother   . Diabetes Father   . Alzheimer's disease Sister   . Heart disease Brother   . Heart attack Brother   . Bladder Cancer Neg Hx   . Kidney cancer Neg Hx   . Prostate cancer Neg Hx     Social History Social History  Substance Use Topics  . Smoking status: Former Smoker    Packs/day: 1.00    Years: 28.00    Types: Cigarettes    Quit date: 07/15/2015  . Smokeless tobacco: Never Used  . Alcohol use No     Allergies   Codeine and Prednisone   Review of Systems Review of Systems  Constitutional: Negative for chills and fever.  HENT: Negative for sinus pressure and sore throat.   Eyes: Negative for visual disturbance.  Respiratory: Positive for cough and shortness of breath.   Cardiovascular: Negative for chest pain and leg swelling.  Gastrointestinal: Negative for nausea and vomiting.  Genitourinary: Negative for  dyspareunia and hematuria.  Musculoskeletal: Negative for back pain and neck pain.  Skin: Negative for rash.     Physical Exam Updated Vital Signs BP 119/68 (BP Location: Right Arm)   Pulse 82   Resp 20   SpO2 91%   Physical Exam  Constitutional: She appears well-developed and well-nourished. No distress.  Obese, fatigued appearing female  HENT:  Head: Normocephalic and atraumatic.  Eyes: Conjunctivae are normal.  Neck: Normal range of motion. Neck supple.  Cardiovascular: Normal rate, regular rhythm, normal heart sounds and intact distal pulses.  Exam reveals no friction rub.   No murmur heard. Pulmonary/Chest: Effort normal and breath sounds normal. No respiratory distress. She has no wheezes. She has no rales.  Abdominal: Soft. Bowel sounds are normal. She exhibits no distension. There is no tenderness.  Musculoskeletal: Normal range of motion.  Neurological: She is alert. Coordination normal.  Skin: Skin is warm and dry. No rash noted. She is not diaphoretic.  Psychiatric: She has a normal mood and affect. Her behavior is normal.  Nursing note and vitals reviewed.    ED Treatments / Results  Labs (all labs ordered are listed, but only abnormal results are displayed) Labs Reviewed  BASIC METABOLIC PANEL - Abnormal; Notable for the following:       Result Value   Potassium 5.3 (*)    CO2 21 (*)    Glucose, Bld 114 (*)    BUN 33 (*)    Creatinine, Ser 1.63 (*)    Calcium 10.4 (*)    GFR calc non Af Amer 32 (*)    GFR calc Af Amer 37 (*)    All other components within normal limits  CBC - Abnormal; Notable for the following:    Hemoglobin 11.9 (*)    All other components within normal limits    EKG  EKG Interpretation  Date/Time:  Friday September 04 2016 12:39:11 EDT Ventricular Rate:  90 PR Interval:    QRS Duration: 92 QT Interval:  383 QTC Calculation: 469 R Axis:   90 Text Interpretation:  Sinus rhythm Borderline right axis deviation No significant  change since last tracing Confirmed by Winfred Leeds  MD, SAM [phone]) on 09/04/2016 12:42:02 PM       Radiology No results found.  Procedures Procedures (including critical care time)  Medications Ordered in ED Medications  albuterol (PROVENTIL) (2.5 MG/3ML) 0.083% nebulizer solution 5 mg (5 mg Nebulization Given 09/04/16 1239)     Initial Impression / Assessment and Plan / ED Course  I have reviewed the triage vital signs and the nursing notes.  Pertinent labs & imaging results that were available during my care of the patient were reviewed by me and considered in my medical decision making (see chart for details).  Clinical Course   Patient was sent here today by her pulmonologist for PE rule out. I spoke with Rexene Edison, NP today who saw the patient at the pulmonologist office. She states caregiver informed her that the patient has had fatigue since her kidney surgery mid July with progressively worsening shortness of breath over the last 3 weeks. I informed Elizabeth Thompson that the patient's serum creatinine was elevated and the radiologist suggested a VQ scan over a CT angiogram. I asked Ms. Thompson her suggestions for the patient if the PE workup is negative. She stated that the patient has home oxygen so if the workup is negative there would be no need for the patient to be admitted and she can be sent home and instructed to be on her home oxygen at all times.  Patient was found to be in metabolic acidosis with no anion gap. Elevated d-dimer. Mildly elevated potassium. Chest x-ray negative for any acute abnormalities or infiltrates. EKG with no significant changes since last tracing.  Plan is if PE workup is negative patient will be discharged home with instructions to remain on home oxygen all times and strict follow-up with pulmonologist. Before discharge will ambulate patient on 2 L of oxygen and evaluate her oxygen saturation.  Patient care signed out to [facility], PA-C at  change of shift and he will evaluate the results of her VQ scan and determine her disposition based on the results.  Patient case discussed and patient seen by Dr. Winfred Leeds who agrees with the above plan.   Final Clinical Impressions(s) / ED Diagnoses   Final diagnoses:  None    New Prescriptions New Prescriptions   No medications on file     Kalman Drape, [place] 09/04/16 1646    Orlie Dakin, MD 09/04/16 1651

## 2016-09-04 NOTE — ED Triage Notes (Signed)
Pt has had increase shortness of breath x 3 weeks.  Went to pulmonary today and sent here.  Exacerbation of emphysema noted.  Pt arrived with 85% oxygen. Pt to wear oxygen at 2l but does not have.  Increased to 97% with 2l per [place]

## 2016-09-04 NOTE — ED Provider Notes (Signed)
Complains of cough productive of thick white sputum and progressively worsening shortness of breath for the past 3 or 4 weeks. No fever. No other associated symptoms. Presently asymptomatic. Seen at Dr.Wert's office earlier today, sent here for further evaluation. On exam no distress lungs clear to auscultation heart regular rate and rhythm abdomen obese, nontender 70s without edema. Renal insufficiency noted to be chronic   Orlie Dakin, MD 09/04/16 1651

## 2016-09-04 NOTE — Assessment & Plan Note (Addendum)
?  Exacerbation with acute on chronic hypoxic respiratory failure  Recent surgery /immobility /obesity place at high risk for PE renal fxn will exclude CTa Chest  Check D Dimer , if positive check VQ scan .   Plan  Admit see H/p  Start COPD flare regimen .  Check cxr ? PNA , check bnp , tropoinn  Late add :  Dr. Melvyn Novas  Aware and with exam  No beds available currently , will send to ER -ER triage notified.  Niece will take via PV to ER right now.  Pt stable for transport. On O2 w/ o2 sats 98% on 2l/m

## 2016-09-04 NOTE — Progress Notes (Signed)
Subjective:    Patient ID: Elizabeth Thompson, female    DOB: 11-Sep-1951,    MRN: [id number]    Brief patient profile:  68 yobf quit smoking aug 2016 with onset of wheezing sob while in rehab p back surgery and some better while in rehab but generally worse since at home since Nov 8th >  On multiple meds no better > referred to pulmonary clinic 12/05/2015 by Barbee Shropshire    History of Present Illness  12/05/2015 1st [facility] office visit/ Wert   Chief Complaint  Patient presents with  . Pulmonary Consult    Referred by Everardo Beals. Pt c/o SOB and low o2 sats for the past 3 wks.    doe = MMRC1 = can walk nl pace, flat grade, can't hurry or go uphills or steps s sob  But this is worse since stopped smoking assoc with dry daytime cough and sense of pnds.  Was apparently not needing any resp meds while smoking now no response to multiple inhalers/ nebs  rec Stop lisinopril  Start avapro (ibesartan) 300 mg one daily - ok to break in half if too strong GERD diet   01/17/2016  f/u ov/Wert re: GOLD 0  Chief Complaint  Patient presents with  . Follow-up    PFT done today. PFT done today. She has had some swelling in her ankles and feet.    no cough off acei/ doe = baseline = fine flat at nl pace  >>no change , CT planned for 10/2017 for Lung nodule   09/04/2016 Acute OV  : COPD  Pt presents for an acute office visit . Over last 3 weeks she has had cough, congestion , worsening DOE /SOB . Mucus has been green to clear at times. No fever or chest pain  O2 sats have been in 70s . She is on Oxygen 2l/m At bedtime  Only .  Today in office on arrival o2 sats 74% on RA . Walk test in office , required 4l/m to keep sats >90% walking. She denies leg swelling, calf pain, hemotpysis , n/v/d.  Appetite is good .  She was admitted in July 2017 for left renal mass /nephrectomy (renal cell carcinoma ).  She is here with her caregiver (neice) ,she is very weak.  She will require admission to  hospital for further evaluation .     Current Medications, Allergies, Complete Past Medical History, Past Surgical History, Family History, and Social History were reviewed in [employer] record.  ROS  The following are not active complaints unless bolded sore throat, dysphagia, dental problems, itching, sneezing,  nasal congestion or excess/ purulent secretions, ear ache,   fever, chills, sweats, unintended wt loss, classically pleuritic or exertional cp, hemoptysis,  orthopnea pnd or leg swelling, presyncope, palpitations, abdominal pain, anorexia, nausea, vomiting, diarrhea  or change in bowel or bladder habits, change in stools or urine, dysuria,hematuria,  rash, arthralgias, visual complaints, headache, numbness, weakness or ataxia or problems with walking or coordination,  change in mood/affect or memory.            Objective:   Physical Exam  amb obese bf nad    Vitals:   09/04/16 1034  Pulse: 84  Temp: 98.2 F (36.8 C)  TempSrc: Oral  SpO2: 98%  Weight: 213 lb (96.6 kg)  Height: _0  (1.6 m)   Vital signs reviewed  HEENT: nl dentition, turbinates, and oropharynx. Nl external ear canals without cough reflex  NECK :  without JVD/Nodes/TM/ nl carotid upstrokes bilaterally   LUNGS: no acc muscle use,  Nl contour chest which is clear to A and P bilaterally without cough on insp or exp maneuvers   CV:  RRR  no s3 or murmur or increase in P2, no edema , neg calf pain   ABD:  soft and nontender with nl inspiratory excursion in the supine position. No bruits or organomegaly, bowel sounds nl  MS:  Nl gait/ ext warm without deformities, calf tenderness, cyanosis or clubbing No obvious joint restrictions   SKIN: warm and dry without lesions    NEURO:  alert, approp, nl sensorium with  no motor deficits    CTa chest  11/06/15 No demonstrable pulmonary embolus. Adenopathy of uncertain etiology. Underlying emphysema. No edema or consolidation.  4 mm nodular opacity anterior segment right upper lobe. Followup of this nodular opacity should be based on Fleischner Society guidelines. Given risk factors for bronchogenic carcinoma, follow-up chest CT at 1 year is recommended.   CTa CHest 02/11/16  No demonstrable pulmonary embolus.  Adenopathy is noted at multiple sites in the thoracic region. Adenopathy appears very little changed compared to 3 months prior. Etiology uncertain. A neoplastic etiology must be of concern.  There are mass lesions arising from the upper pole the left kidney which have attenuation values higher than is expected with cysts. Renal neoplasm cannot be excluded on the left. Advise pre and post-contrast renal MR or CT to further evaluate ; MR would be the optimal imaging of choice in this circumstance.  There is underlying emphysema with fibrotic type change in the lungs. No frank consolidation or edema noted.   Jazzmyne Rasnick NP-C  [facility]  [phone]

## 2016-09-04 NOTE — ED Notes (Signed)
Pt transported to [facility] 

## 2016-09-04 NOTE — Patient Instructions (Signed)
Admit  

## 2016-09-04 NOTE — Progress Notes (Signed)
Chart and office note reviewed in detail and indepently examined  > agree with a/p as outlined

## 2016-09-04 NOTE — ED Notes (Signed)
Pt O2 dropped to 83% on two liters while ambulating

## 2016-09-04 NOTE — H&P (Signed)
History and Physical    Elizabeth Thompson TAV:[id number] DOB: 01-20-1951 DOA: 09/04/2016  Referring MD/NP/PA:   PCP: Webb Silversmith, NP   Patient coming from:  The patient is coming from home.  At baseline, pt is partially dependent for her ADL.  Chief Complaint: Worsening shortness breath  HPI: Elizabeth Thompson is a 65 y.o. female with medical history significant of hypertension, hyperlipidemia, diabetes mellitus, COPD, on home oxygen 2-3 L, GERD, hypercalcemia, formal smoker, bipolar, depression, back pain, renal cell carcinoma (S/P left nephrectomy 06/2016, no radiation and chemotherapy), who presents with worsening shortness of breath.  Patient states that she has worsening shortness of breath which has been going on for 3 weeks. She coughs up dlear and thick mucus. No fever, chills, chest pain, runny nose throat. Patient denies nausea, vomiting, abdominal pain. She states that she has intermittent diarrhea which is alleviated by eating yogurt. She had one BM with loose stool today. No symptoms of UTI or unilateral weakness. Pt was seen by her pulmonologist, Dr. Melvyn Novas today, who switched lisinopril to Irbesartan, and recommended admission.  ED Course: pt was found to have WBC 7.4, temperature normal, oxygen desaturated to 84% on 2 L, no tachycardia, has tachypnea, potassium 5.3 without T-wave peaking, stable renal function, calcium 10.4, negative troponin, BNP 15.4, negative chest x-ray. Positive d-dimer, but no PE by VQ scan. Patient is admitted to telemetry bed as inpatient.  Review of Systems:   General: no fevers, chills, no changes in body weight, has poor appetite, has fatigue HEENT: no blurry vision, hearing changes or sore throat Respiratory: has dyspnea, coughing, no wheezing CV: no chest pain, no palpitations GI: no nausea, vomiting, abdominal pain, has diarrhea, no constipation GU: no dysuria, burning on urination, increased urinary frequency, hematuria  Ext: no leg  edema Neuro: no unilateral weakness, numbness, or tingling, no vision change or hearing loss Skin: no rash, no skin tear. MSK: No muscle spasm, no deformity, no limitation of range of movement in spin Heme: No easy bruising.  Travel history: No recent long distant travel.  Allergy:  Allergies  Allergen Reactions  . Codeine Nausea And Vomiting  . Prednisone Other (See Comments)    Has to monitor because she is a diabetic     Past Medical History:  Diagnosis Date  . Arthritis   . Back pain   . Bell's palsy   . Bipolar disorder ([place])   . Bronchitis   . Chronic low back pain 05/09/2015  . COPD (chronic obstructive pulmonary disease) ([place])    hyoxia during sleep- using O2 as needed  . Cough with expectoration 05/21/16   recent diagnosis of bronchitis  . Cyst of right kidney   . Diabetes mellitus without complication ([place])    type 2  . Frequency of urination   . GERD (gastroesophageal reflux disease)   . Glaucoma   . History of blood transfusion   . History of colon polyps   . History of hiatal hernia   . History of tobacco use   . Hypercalcemia   . Hyperlipidemia   . Hypertension   . Memory disorder 09/05/2014  . On home oxygen therapy    3L/M [place] at night   . Schizo-affective psychosis ([place])   . Shortness of breath dyspnea    exertion   . Uterine fibroid     Past Surgical History:  Procedure Laterality Date  . ABDOMINAL HYSTERECTOMY    . APPENDECTOMY    . COLONOSCOPY W/ POLYPECTOMY    .  LUMBAR LAMINECTOMY/DECOMPRESSION MICRODISCECTOMY N/A 08/14/2015   Procedure: LUMBAR DECOMPRESSION MICRODISCECTOMY L3-S1;  Surgeon: Melina Schools, MD;  Location: [facility];  Service: Orthopedics;  Laterality: N/A;  . TONSILLECTOMY AND ADENOIDECTOMY      Social History:  reports that she quit smoking about 13 months ago. Her smoking use included Cigarettes. She has a 28.00 pack-year smoking history. She has never used smokeless tobacco. She reports that she does not drink alcohol or use  drugs.  Family History:  Family History  Problem Relation Age of Onset  . Dementia Mother   . Alzheimer's disease Mother   . Heart disease Mother   . Hypertension Mother   . Diabetes Mother   . Diabetes Father   . Alzheimer's disease Sister   . Heart disease Brother   . Heart attack Brother   . Bladder Cancer Neg Hx   . Kidney cancer Neg Hx   . Prostate cancer Neg Hx      Prior to Admission medications   Medication Sig Start Date End Date Taking? Authorizing Provider  albuterol (PROVENTIL HFA;VENTOLIN HFA) 108 (90 BASE) MCG/ACT inhaler Inhale 2 puffs into the lungs every 6 (six) hours as needed for wheezing or shortness of breath. 08/27/14  Yes Kerrie Buffalo, NP  albuterol (PROVENTIL) (2.5 MG/3ML) 0.083% nebulizer solution Take 2.5 mg by nebulization every 6 (six) hours as needed for wheezing or shortness of breath.   Yes Historical Provider, MD  ARIPiprazole (ABILIFY) 20 MG tablet Take 1 tablet (20 mg total) by mouth at bedtime. 08/27/14  Yes Kerrie Buffalo, NP  benzonatate (TESSALON) 100 MG capsule Take 1 capsule (100 mg total) by mouth 2 (two) times daily as needed for cough. 06/01/16  Yes Jearld Fenton, NP  budesonide-formoterol [facility]) 160-4.5 MCG/ACT inhaler Inhale 2 puffs into the lungs 2 (two) times daily.   Yes Historical Provider, MD  Cholecalciferol (VITAMIN D3 PO) Take 1 tablet by mouth daily.   Yes Historical Provider, MD  donepezil (ARICEPT) 5 MG tablet Take 1 tablet (5 mg total) by mouth at bedtime. 08/27/14  Yes Kerrie Buffalo, NP  dorzolamide-timolol (COSOPT) 22.3-6.8 MG/ML ophthalmic solution Place 1 drop into both eyes 2 (two) times daily.   Yes Historical Provider, MD  esomeprazole (NEXIUM) 20 MG capsule Take 20 mg by mouth daily.    Yes Historical Provider, MD  glimepiride (AMARYL) 4 MG tablet Take 1 tablet (4 mg total) by mouth daily with breakfast. 08/27/14  Yes Kerrie Buffalo, NP  glucose blood (ACCU-CHEK AVIVA) test strip 1 each by Other route 2 (two) times  daily. 08/04/16  Yes Jearld Fenton, NP  hydrOXYzine (ATARAX/VISTARIL) 25 MG tablet Take 25 mg by mouth at bedtime.  04/17/16  Yes Historical Provider, MD  ibuprofen (ADVIL,MOTRIN) 200 MG tablet Take 600 mg by mouth every 6 (six) hours as needed (For pain.).   Yes Historical Provider, MD  Ipratropium-Albuterol (COMBIVENT) 20-100 MCG/ACT AERS respimat Inhale 1 puff into the lungs every 6 (six) hours.   Yes Historical Provider, MD  irbesartan (AVAPRO) 300 MG tablet Take 1 tablet (300 mg total) by mouth daily. 12/05/15  Yes Tanda Rockers, MD  lamoTRIgine (LAMICTAL) 100 MG tablet Take 1 tablet (100 mg total) by mouth every evening. Patient taking differently: Take 100 mg by mouth daily after supper.  08/27/14  Yes Kerrie Buffalo, NP  latanoprost (XALATAN) 0.005 % ophthalmic solution Place 1 drop into both eyes at bedtime. 08/27/14  Yes Kerrie Buffalo, NP  Menthol, Topical Analgesic, (BIOFREEZE) 10 % LIQD  Apply 1 application topically as needed (For pain.).    Yes Historical Provider, MD  metFORMIN (GLUCOPHAGE) 1000 MG tablet Take 1 tablet (1,000 mg total) by mouth 2 (two) times daily with a meal. 08/27/14  Yes Kerrie Buffalo, NP  methocarbamol (ROBAXIN) 500 MG tablet Take 1 tablet (500 mg total) by mouth 3 (three) times daily as needed for muscle spasms. 08/14/15  Yes Melina Schools, MD  mirtazapine (REMERON) 7.5 MG tablet Take 1 tablet (7.5 mg total) by mouth at bedtime. 08/27/14  Yes Kerrie Buffalo, NP  Multiple Vitamin (MULTIVITAMIN) capsule Take 1 capsule by mouth daily.   Yes Historical Provider, MD  ondansetron (ZOFRAN) 4 MG tablet Take 1 tablet (4 mg total) by mouth every 8 (eight) hours as needed for nausea or vomiting. 08/14/15  Yes Melina Schools, MD  OXYGEN Inhale 3 L into the lungs at bedtime.    Yes Historical Provider, MD  Selenium Sulf-Pyrithione-Urea 2.25 % SHAM Apply 1 application topically every 14 (fourteen) days. Applied to scalp for dermatitis. 01/25/15  Yes Historical Provider, MD  senna  (SENOKOT) 8.6 MG tablet Take 1 tablet by mouth daily.   Yes Historical Provider, MD  simvastatin (ZOCOR) 40 MG tablet Take 1 tablet (40 mg total) by mouth daily. Patient taking differently: Take 40 mg by mouth daily after supper.  08/27/14  Yes Kerrie Buffalo, NP    Physical Exam: Vitals:   09/04/16 2230 09/04/16 2258 09/04/16 2317 09/04/16 2323  BP:  149/69 (!) 142/91   Pulse: 91 88    Resp: 25 24 (!) 24   Temp:   98.5 F (36.9 C)   TempSrc:   Oral   SpO2: 94% 94% 98% 99%  Weight:   94.8 kg (208 lb 15.9 oz)   Height:   _0  (1.6 m)    General: Not in acute distress HEENT:       Eyes: PERRL, EOMI, no scleral icterus.       ENT: No discharge from the ears and nose, no pharynx injection, no tonsillar enlargement.        Neck: No JVD, no bruit, no mass felt. Heme: No neck lymph node enlargement. Cardiac: S1/S2, RRR, No murmurs, No gallops or rubs. Respiratory: Slightly decreased air movement bilaterally. No rales, wheezing, rhonchi or rubs. GI: Soft, nondistended, nontender, no rebound pain, no organomegaly, BS present. GU: No hematuria Ext: No pitting leg edema bilaterally. 2+DP/PT pulse bilaterally. Musculoskeletal: No joint deformities, No joint redness or warmth, no limitation of ROM in spin. Skin: No rashes.  Neuro: Alert, oriented X3, cranial nerves II-XII grossly intact, moves all extremities normally.  Psych: Patient is not psychotic, no suicidal or hemocidal ideation.  Labs on Admission: I have personally reviewed following labs and imaging studies  CBC:  Recent Labs Lab 09/04/16 1241  WBC 7.4  HGB 11.9*  HCT 37.2  MCV 86.3  PLT 841   Basic Metabolic Panel:  Recent Labs Lab 09/04/16 1241  NA 140  K 5.3*  CL 111  CO2 21*  GLUCOSE 114*  BUN 33*  CREATININE 1.63*  CALCIUM 10.4*   GFR: Estimated Creatinine Clearance: 37.2 mL/min (by C-G formula based on SCr of 1.63 mg/dL (H)). Liver Function Tests: No results for input(s): AST, ALT, ALKPHOS, BILITOT,  PROT, ALBUMIN in the last 168 hours. No results for input(s): LIPASE, AMYLASE in the last 168 hours. No results for input(s): AMMONIA in the last 168 hours. Coagulation Profile: No results for input(s): INR, PROTIME in the last 168  hours. Cardiac Enzymes: No results for input(s): CKTOTAL, CKMB, CKMBINDEX, TROPONINI in the last 168 hours. BNP (last 3 results) No results for input(s): PROBNP in the last 8760 hours. HbA1C: No results for input(s): HGBA1C in the last 72 hours. CBG:  Recent Labs Lab 09/04/16 2327  GLUCAP 176*   Lipid Profile: No results for input(s): CHOL, HDL, LDLCALC, TRIG, CHOLHDL, LDLDIRECT in the last 72 hours. Thyroid Function Tests: No results for input(s): TSH, T4TOTAL, FREET4, T3FREE, THYROIDAB in the last 72 hours. Anemia Panel: No results for input(s): VITAMINB12, FOLATE, FERRITIN, TIBC, IRON, RETICCTPCT in the last 72 hours. Urine analysis:    Component Value Date/Time   COLORURINE YELLOW 08/17/2015 0109   APPEARANCEUR CLEAR 08/17/2015 0109   LABSPEC 1.013 08/17/2015 0109   PHURINE 6.0 08/17/2015 0109   GLUCOSEU NEGATIVE 08/17/2015 0109   HGBUR TRACE (A) 08/17/2015 0109   BILIRUBINUR NEGATIVE 08/17/2015 0109   KETONESUR NEGATIVE 08/17/2015 0109   PROTEINUR 100 (A) 08/17/2015 0109   UROBILINOGEN 0.2 08/17/2015 0109   NITRITE NEGATIVE 08/17/2015 0109   LEUKOCYTESUR NEGATIVE 08/17/2015 0109   Sepsis Labs: _0 (procalcitonin:4,lacticidven:4) )No results found for this or any previous visit (from the past 240 hour(s)).   Radiological Exams on Admission: Dg Chest 2 View  Result Date: 09/04/2016 CLINICAL DATA:  SOB x 2 days; hx COPD, bronchitis; diabetic; HTN; ex smoker EXAM: CHEST - 2 VIEW COMPARISON:  05/21/2016 FINDINGS: Lungs clear. Heart size upper limits normal.  Mildly tortuous thoracic aorta. No effusion.  No pneumothorax. Visualized bones unremarkable. IMPRESSION: No acute cardiopulmonary disease. Electronically Signed   By: Lucrezia Europe  M.D.   On: 09/04/2016 14:09   Nm Pulmonary Perf And Vent  Result Date: 09/04/2016 CLINICAL DATA:  One month history of shortness of breath worsening over the past week. EXAM: NUCLEAR MEDICINE VENTILATION - PERFUSION LUNG SCAN TECHNIQUE: Ventilation images were obtained in multiple projections using inhaled aerosol Tc-60mDTPA. Perfusion images were obtained in multiple projections after intravenous injection of Tc-9106mAA. RADIOPHARMACEUTICALS:  32.7 mCi Technetium-9993mPA aerosol inhalation and 4.4 mCi Technetium-80m17m IV COMPARISON:  Chest x-ray 09/04/2016 FINDINGS: Ventilation: Heterogeneous ventilation study without focal segmental defect. Mild central deposition of the radiopharmaceutical. Perfusion: Patchy perfusion study but no segmental or subsegmental defects to suggest pulmonary embolism. IMPRESSION: Negative study for pulmonary embolism. Electronically Signed   By: P.  Marijo Sanes.   On: 09/04/2016 17:42     EKG: Independently reviewed. Sinus rhythm, 2 DC 469, poor R-wave progression, no ischemic change.  Assessment/Plan Principal Problem:   Acute on chronic respiratory failure with hypoxemia (HCC) Active Problems:   Schizoaffective disorder (HCC)   Hypercalcemia   Diabetes mellitus type 2, controlled, without complications (HCC)   HLD (hyperlipidemia)   Essential hypertension   GERD (gastroesophageal reflux disease)   Renal malignant tumor (HCC)   COPD exacerbation (HCC)   Depression   Acute on chronic respiratory failure with hypoxia (HCC)   Acute on chronic respiratory failure with hypoxemia: Etiology is not clear. Chest x-ray is negative. No PE by VQ scan. Patient has slightly decreased air movement bilaterally, but otherwise clear on auscultation. Pt dose not seem to have COPD exacerbation. It is possible that the patient just have COPD progression. Patient does not have fever or chills. No indication for antibiotics.   -will admit to tele bed as inpt -Nebulizers:  scheduled Duoneb and prn albuterol -Tessalon for cough  -sputum culture, respiratory virus panel -Nasal cannula oxygen to maintain oxygen saturation above 90%  Schizoaffective disorder  and depression: stable -Continue Abilify, lamictal  Hypercalcemia: this is chronic issue. Etiology is not clear. Baseline calcium 10.4-10.5. Her calcium is 10.4 today. -Follow-up by BMP  DM-II: Last A1c 7.3, fairly controled. Patient is taking metformin and Amaryl at home -SSI  HLD: Last LDL was not on record -Continue home medications: Lipitor  GERD: -Protonix  DVT ppx: sq Lovenox Code Status: Full code Family Communication: Yes, patient's niece at bed side Disposition Plan:  Anticipate discharge back to previous home environment Consults called:  none Admission status: Inpatient/tele   Date of Service 09/05/2016    Ivor Costa [facility] Pager [phone]  If 7PM-7AM, please contact night-coverage [email or web address] Password [facility] 09/05/2016, 4:46 AM

## 2016-09-04 NOTE — ED Provider Notes (Signed)
  66 year old female presents today with shortness of breath. Patient was signed out to me by oncoming provider pending VQ scan.  Please see previous provider's note for full H&P. Patient notes progressive shortness of breath over the last 3-4 weeks. Normally using 2 L at night to sleep, with progression to having oxygen at home at baseline. She reports ambulation causes severe dyspnea and drop in O2 saturation. She reports intermittent productive cough, denies fever. She was seen by pulmonology today and sent over for hospital admission, VQ scan to rule out PE. Patient received VQ scan here in the ED which showed no signs of pulmonary embolism. I had patient ambulate in the hall on 2 L of oxygen, she dropped down to 84. She reports down into the 70s at home. With patient's new oxygen requirement, severe hypoxic episodes a few appropriate patient be brought into the hospital for observation and further management.   Vitals:   09/04/16 1720 09/04/16 1805  BP: 125/71 143/83  Pulse: 88 88  Resp: 20 25  Temp: 98.5 F (36.9 C)      Okey Regal, PA-C 09/05/16 0143    Gwenyth Allegra Tegeler, MD 09/05/16 [phone]

## 2016-09-04 NOTE — Assessment & Plan Note (Addendum)
Was just on noct 02 now desats on RA s clear explanation   Discussed in detail all the  indications, usual  risks and alternatives  relative to the benefits with patient who agrees to proceed with ER evaluation   Christinia Gully, MD Pulmonary and [place] [phone] After 5:30 PM or weekends, call [phone]

## 2016-09-04 NOTE — ED Notes (Signed)
Will assess temperature when pt has returned from [facility]

## 2016-09-05 LAB — GLUCOSE, CAPILLARY
GLUCOSE-CAPILLARY: 124 mg/dL — AB (ref 65–99)
GLUCOSE-CAPILLARY: 119 mg/dL — AB (ref 65–99)
Glucose-Capillary: 191 mg/dL — ABNORMAL HIGH (ref 65–99)
Glucose-Capillary: 110 mg/dL — ABNORMAL HIGH (ref 65–99)

## 2016-09-05 LAB — COMPREHENSIVE METABOLIC PANEL
ALK PHOS: 60 U/L (ref 38–126)
ALT: 12 U/L — AB (ref 14–54)
AST: 17 U/L (ref 15–41)
Albumin: 3.8 g/dL (ref 3.5–5.0)
Anion gap: 6 (ref 5–15)
BUN: 25 mg/dL — AB (ref 6–20)
CALCIUM: 9.8 mg/dL (ref 8.9–10.3)
CHLORIDE: 110 mmol/L (ref 101–111)
CO2: 24 mmol/L (ref 22–32)
CREATININE: 1.57 mg/dL — AB (ref 0.44–1.00)
GFR, EST AFRICAN AMERICAN: 39 mL/min — AB (ref >60)
GFR, EST NON AFRICAN AMERICAN: 33 mL/min — AB (ref >60)
Glucose, Bld: 156 mg/dL — ABNORMAL HIGH (ref 65–99)
Potassium: 5.1 mmol/L (ref 3.5–5.1)
Sodium: 140 mmol/L (ref 135–145)
Total Bilirubin: 0.6 mg/dL (ref 0.3–1.2)
Total Protein: 7.7 g/dL (ref 6.5–8.1)

## 2016-09-05 LAB — TROPONIN I

## 2016-09-05 MED ORDER — INSULIN ASPART 100 UNIT/ML ~~LOC~~ SOLN
0.0000 [IU] | Freq: Three times a day (TID) | SUBCUTANEOUS | Status: DC
  Administered 2016-09-05: 2 [IU] via SUBCUTANEOUS
  Administered 2016-09-05: 1 [IU] via SUBCUTANEOUS
  Administered 2016-09-06: 2 [IU] via SUBCUTANEOUS
  Administered 2016-09-06 – 2016-09-07 (×2): 1 [IU] via SUBCUTANEOUS
  Administered 2016-09-08: 3 [IU] via SUBCUTANEOUS

## 2016-09-05 MED ORDER — INSULIN ASPART 100 UNIT/ML ~~LOC~~ SOLN: 0.0000 [IU] | Freq: Every day | SUBCUTANEOUS | Status: DC

## 2016-09-05 NOTE — Evaluation (Addendum)
Physical Therapy Evaluation Patient Details Name: Elizabeth Thompson MRN: [id number] DOB: 1951/09/18 Today's Date: 09/05/2016   History of Present Illness  65 y.o. female admitted with respiratory failure. Pt is on 2-3L O2 24hours at home. PMH of COPD, HTN, DM, schizoaffective d/o, back surgery Aug 2016, renal cancer.  Clinical Impression  Pt admitted with above diagnosis. Pt currently with functional limitations due to the deficits listed below (see PT Problem List). Pt ambulated 200' without an assistive device on 3L O2, 2/4 dyspnea, pulse ox did not give a reading while walking.  Pt will benefit from skilled PT to increase their independence and safety with mobility to allow discharge to the venue listed below.       Follow Up Recommendations Home Health PT (h/o multiple falls in past year)    Equipment Recommendations  none    Recommendations for Other Services       Precautions / Restrictions Precautions Precautions: Fall Precaution Comments: O2, h/o multiple falls  Restrictions Weight Bearing Restrictions: No      Mobility  Bed Mobility Overal bed mobility: Modified Independent             General bed mobility comments: HOB up 30*  Transfers Overall transfer level: Needs assistance Equipment used: None Transfers: Sit to/from Stand Sit to Stand: Min guard         General transfer comment: mild posterior lean initially, pt self corrected, min/guard for safety  Ambulation/Gait Ambulation/Gait assistance: Min guard Ambulation Distance (Feet): 240 Feet Assistive device: None Gait Pattern/deviations: WFL(Within Functional Limits)   Gait velocity interpretation: at or above normal speed for age/gender General Gait Details: steady, no LOB, walked with 3L O2 Tool, pulse ox did not give reading while walking, 2/4 dyspnea, HR 84 walking  Stairs            Wheelchair Mobility    Modified Rankin (Stroke Patients Only)       Balance Overall balance  assessment: History of Falls (pt reports several falls in past year)                                           Pertinent Vitals/Pain Pain Assessment: No/denies pain    Home Living Family/patient expects to be discharged to:: Private residence Living Arrangements: Other relatives (Niece) Available Help at Discharge: Family;Available 24 hours/day;Available PRN/intermittently Type of Home: House Home Access: Stairs to enter Entrance Stairs-Rails: None Entrance Stairs-Number of Steps: 1 Home Layout: One level Home Equipment: Shower seat;Wheelchair - [employer] - 2 wheels;Walker - 4 wheels Additional Comments: lives with niece who is CG, on home O2    Prior Function Level of Independence: Independent with assistive device(s)         Comments: walks with rollator, independent with bathing/dressing     Hand Dominance        Extremity/Trunk Assessment   Upper Extremity Assessment: Overall WFL for tasks assessed           Lower Extremity Assessment: Overall WFL for tasks assessed      Cervical / Trunk Assessment: Normal  Communication   Communication: No difficulties  Cognition Arousal/Alertness: Awake/alert Behavior During Therapy: WFL for tasks assessed/performed Overall Cognitive Status: Within Functional Limits for tasks assessed                      General Comments  Exercises     Assessment/Plan    PT Assessment Patient needs continued PT services  PT Problem List Decreased activity tolerance;Cardiopulmonary status limiting activity          PT Treatment Interventions Gait training;Functional mobility training;Therapeutic exercise;Balance training;Therapeutic activities;Patient/family education    PT Goals (Current goals can be found in the Care Plan section)  Acute Rehab PT Goals Patient Stated Goal: to walk outside PT Goal Formulation: With patient Time For Goal Achievement: 09/19/16 Potential to Achieve Goals:  Good    Frequency Min 3X/week   Barriers to discharge        Co-evaluation               End of Session Equipment Utilized During Treatment: Gait belt;Oxygen Activity Tolerance: Patient tolerated treatment well Patient left: in chair;with call bell/phone within reach Nurse Communication: Mobility status         Time: 0488-8916 PT Time Calculation (min) (ACUTE ONLY): 33 min   Charges:   PT Evaluation $PT Eval Low Complexity: 1 Procedure PT Treatments $Gait Training: 8-22 mins   PT G Codes:        Philomena Doheny 09/05/2016, 8:40 AM [phone]

## 2016-09-05 NOTE — Progress Notes (Signed)
[facility] PROGRESS NOTE  Elizabeth Thompson QAS:[id number] DOB: 1950-12-21 DOA: 09/04/2016   PCP: Webb Silversmith, NP     Assessment/Plan: Principal Problem:   Acute on chronic respiratory failure with hypoxemia ([place]) Active Problems:   Schizoaffective disorder (HCC)   Hypercalcemia   Diabetes mellitus type 2, controlled, without complications (HCC)   HLD (hyperlipidemia)   Essential hypertension   GERD (gastroesophageal reflux disease)   Renal malignant tumor ([place])   COPD exacerbation (HCC)   Depression   Acute on chronic respiratory failure with hypoxia ([place])   65 y.o. female admitted with respiratory failure. Pt is on 2-3L O2 24hours at home. PMH of COPD, HTN, DM, schizoaffective d/o, back surgery Aug 2016, renal cancer.who presents with worsening shortness of breath  Assessment and plan  Acute on chronic respiratory failure with hypoxemia: Etiology is not clear. Chest x-ray is negative. No PE by VQ scan. Patient has slightly decreased air movement bilaterally, but otherwise clear on auscultation. Pt dose not seem to have COPD exacerbation. It is possible that the patient just have COPD progression. Patient does not have fever or chills. Will obtain a 2-D echo, continue telemetry Continue-Nebulizers: scheduled Duoneb and prn albuterol -Tessalon for cough  -sputum culture, respiratory virus panel -Nasal cannula oxygen to maintain oxygen saturation above 90%  Schizoaffective disorder and depression: stable -Continue Abilify, lamictal  Hypercalcemia: this is chronic issue. Etiology is not clear. Baseline calcium 10.4-10.5. Her calcium is 10.4 today. -Follow-up by BMP  DM-II: Last A1c 7.3, fairly controled. Patient is taking metformin and Amaryl at home -SSI  HLD: Last LDL was not on record -Continue home medications: Lipitor  GERD: -Protonix    DVT prophylaxsis Lovenox  Code Status:    Full code although   Family Communication: Discussed in  detail with the patient, all imaging results, lab results explained to the patient   Disposition Plan:Anticipate discharge tomorrow with home health      Consultants:  None   Procedures:  None   Antibiotics: Anti-infectives    None         HPI/Subjective: Patient noted to have significant short-term memory deficits and confusion, cannot give any meaningful history  Objective: Vitals:   09/04/16 2317 09/04/16 2323 09/05/16 0559 09/05/16 0921  BP: (!) 142/91  119/61   Pulse:   70   Resp: (!) 24  20   Temp: 98.5 F (36.9 C)  98 F (36.7 C)   TempSrc: Oral  Axillary   SpO2: 98% 99% 100% 96%  Weight: 94.8 kg (208 lb 15.9 oz)     Height: _0  (1.6 m)       Intake/Output Summary (Last 24 hours) at 09/05/16 0947 Last data filed at 09/05/16 0700  Gross per 24 hour  Intake              670 ml  Output                0 ml  Net              670 ml    Exam:  Examination:  General exam: Appears calm and comfortable  Respiratory system: Clear to auscultation. Respiratory effort normal. Cardiovascular system: S1 & S2 heard, RRR. No JVD, murmurs, rubs, gallops or clicks. No pedal edema. Gastrointestinal system: Abdomen is nondistended, soft and nontender. No organomegaly or masses felt. Normal bowel sounds heard. Central nervous systemConfused . No focal neurological deficits. Extremities: Symmetric 5 x 5 power.  Skin: No rashes, lesions or ulcers Psychiatry: Judgement and insight appear normal. Mood & affect appropriate.     Data Reviewed: I have personally reviewed following labs and imaging studies  Micro Results No results found for this or any previous visit (from the past 240 hour(s)).  Radiology Reports Dg Chest 2 View  Result Date: 09/04/2016 CLINICAL DATA:  SOB x 2 days; hx COPD, bronchitis; diabetic; HTN; ex smoker EXAM: CHEST - 2 VIEW COMPARISON:  05/21/2016 FINDINGS: Lungs clear. Heart size upper limits normal.  Mildly tortuous thoracic aorta. No  effusion.  No pneumothorax. Visualized bones unremarkable. IMPRESSION: No acute cardiopulmonary disease. Electronically Signed   By: Lucrezia Europe M.D.   On: 09/04/2016 14:09   Nm Pulmonary Perf And Vent  Result Date: 09/04/2016 CLINICAL DATA:  One month history of shortness of breath worsening over the past week. EXAM: NUCLEAR MEDICINE VENTILATION - PERFUSION LUNG SCAN TECHNIQUE: Ventilation images were obtained in multiple projections using inhaled aerosol Tc-51mDTPA. Perfusion images were obtained in multiple projections after intravenous injection of Tc-946mAA. RADIOPHARMACEUTICALS:  32.7 mCi Technetium-996mPA aerosol inhalation and 4.4 mCi Technetium-65m36m IV COMPARISON:  Chest x-ray 09/04/2016 FINDINGS: Ventilation: Heterogeneous ventilation study without focal segmental defect. Mild central deposition of the radiopharmaceutical. Perfusion: Patchy perfusion study but no segmental or subsegmental defects to suggest pulmonary embolism. IMPRESSION: Negative study for pulmonary embolism. Electronically Signed   By: P.  Marijo Sanes.   On: 09/04/2016 17:42     CBC  Recent Labs Lab 09/04/16 1241  WBC 7.4  HGB 11.9*  HCT 37.2  PLT 358  MCV 86.3  MCH 27.6  MCHC 32.0  RDW 14.7    Chemistries   Recent Labs Lab 09/04/16 1241  NA 140  K 5.3*  CL 111  CO2 21*  GLUCOSE 114*  BUN 33*  CREATININE 1.63*  CALCIUM 10.4*   ------------------------------------------------------------------------------------------------------------------ estimated creatinine clearance is 37.2 mL/min (by C-G formula based on SCr of 1.63 mg/dL (H)). ------------------------------------------------------------------------------------------------------------------ No results for input(s): HGBA1C in the last 72 hours. ------------------------------------------------------------------------------------------------------------------ No results for input(s): CHOL, HDL, LDLCALC, TRIG, CHOLHDL, LDLDIRECT in the  last 72 hours. ------------------------------------------------------------------------------------------------------------------ No results for input(s): TSH, T4TOTAL, T3FREE, THYROIDAB in the last 72 hours.  Invalid input(s): FREET3 ------------------------------------------------------------------------------------------------------------------ No results for input(s): VITAMINB12, FOLATE, FERRITIN, TIBC, IRON, RETICCTPCT in the last 72 hours.  Coagulation profile No results for input(s): INR, PROTIME in the last 168 hours.   Recent Labs  09/04/16 1417  DDIMER 1.67*    Cardiac Enzymes No results for input(s): CKMB, TROPONINI, MYOGLOBIN in the last 168 hours.  Invalid input(s): CK ------------------------------------------------------------------------------------------------------------------ Invalid input(s): POCBNP   CBG:  Recent Labs Lab 09/04/16 2327 09/05/16 0842  GLUCAP 176* 124*       Studies: Dg Chest 2 View  Result Date: 09/04/2016 CLINICAL DATA:  SOB x 2 days; hx COPD, bronchitis; diabetic; HTN; ex smoker EXAM: CHEST - 2 VIEW COMPARISON:  05/21/2016 FINDINGS: Lungs clear. Heart size upper limits normal.  Mildly tortuous thoracic aorta. No effusion.  No pneumothorax. Visualized bones unremarkable. IMPRESSION: No acute cardiopulmonary disease. Electronically Signed   By: D  HLucrezia Europe.   On: 09/04/2016 14:09   Nm Pulmonary Perf And Vent  Result Date: 09/04/2016 CLINICAL DATA:  One month history of shortness of breath worsening over the past week. EXAM: NUCLEAR MEDICINE VENTILATION - PERFUSION LUNG SCAN TECHNIQUE: Ventilation images were obtained in multiple projections using inhaled aerosol Tc-65m 16m. Perfusion images were obtained in multiple projections after intravenous injection of  Tc-31mMAA. RADIOPHARMACEUTICALS:  32.7 mCi Technetium-931mTPA aerosol inhalation and 4.4 mCi Technetium-9990mA IV COMPARISON:  Chest x-ray 09/04/2016 FINDINGS: Ventilation:  Heterogeneous ventilation study without focal segmental defect. Mild central deposition of the radiopharmaceutical. Perfusion: Patchy perfusion study but no segmental or subsegmental defects to suggest pulmonary embolism. IMPRESSION: Negative study for pulmonary embolism. Electronically Signed   By: P. Marijo SanesD.   On: 09/04/2016 17:42      Lab Results  Component Value Date   HGBA1C 7.3 (H) 06/15/2016   HGBA1C 7.1 (H) 05/21/2016   HGBA1C 8.7 (H) 03/19/2016   Lab Results  Component Value Date   CREATININE 1.63 (H) 09/04/2016       Scheduled Meds: . ARIPiprazole  20 mg Oral QHS  . cholecalciferol  1,000 Units Oral Daily  . donepezil  5 mg Oral QHS  . dorzolamide-timolol  1 drop Both Eyes BID  . enoxaparin (LOVENOX) injection  40 mg Subcutaneous QHS  . hydrOXYzine  25 mg Oral QHS  . insulin aspart  0-5 Units Subcutaneous QHS  . insulin aspart  0-9 Units Subcutaneous TID WC  . ipratropium-albuterol  3 mL Nebulization TID  . irbesartan  300 mg Oral Daily  . lamoTRIgine  100 mg Oral QPC supper  . latanoprost  1 drop Both Eyes QHS  . mirtazapine  7.5 mg Oral QHS  . mometasone-formoterol  2 puff Inhalation BID  . multivitamin with minerals  1 tablet Oral Daily  . pantoprazole  40 mg Oral Daily  . senna  1 tablet Oral Daily  . simvastatin  40 mg Oral QPC supper   Continuous Infusions: . sodium chloride 100 mL/hr at 09/05/16 0018     LOS: 1 day    Time spent: >30 MINS    [facility] Pager [phone] 7PM-7AM, please contact night-coverage at [email or web address], password [email or web address]23/[email or web address], 9:47 AM  LOS: 1 day

## 2016-09-06 ENCOUNTER — Inpatient Hospital Stay (HOSPITAL_COMMUNITY): Payer: Medicare Other

## 2016-09-06 DIAGNOSIS — J9621 Acute and chronic respiratory failure with hypoxia: Principal | ICD-10-CM

## 2016-09-06 DIAGNOSIS — E119 Type 2 diabetes mellitus without complications: Secondary | ICD-10-CM

## 2016-09-06 DIAGNOSIS — F250 Schizoaffective disorder, bipolar type: Secondary | ICD-10-CM

## 2016-09-06 DIAGNOSIS — R0600 Dyspnea, unspecified: Secondary | ICD-10-CM

## 2016-09-06 LAB — RESPIRATORY PANEL BY PCR
Adenovirus: NOT DETECTED
BORDETELLA PERTUSSIS-RVPCR: NOT DETECTED
CORONAVIRUS 229E-RVPPCR: NOT DETECTED
CORONAVIRUS HKU1-RVPPCR: NOT DETECTED
CORONAVIRUS OC43-RVPPCR: NOT DETECTED
Chlamydophila pneumoniae: NOT DETECTED
Coronavirus NL63: NOT DETECTED
Influenza A: NOT DETECTED
Influenza B: NOT DETECTED
METAPNEUMOVIRUS-RVPPCR: NOT DETECTED
Mycoplasma pneumoniae: NOT DETECTED
PARAINFLUENZA VIRUS 1-RVPPCR: NOT DETECTED
PARAINFLUENZA VIRUS 2-RVPPCR: NOT DETECTED
PARAINFLUENZA VIRUS 3-RVPPCR: NOT DETECTED
Parainfluenza Virus 4: NOT DETECTED
RHINOVIRUS / ENTEROVIRUS - RVPPCR: NOT DETECTED
Respiratory Syncytial Virus: NOT DETECTED

## 2016-09-06 LAB — BASIC METABOLIC PANEL
Anion gap: 7 (ref 5–15)
BUN: 24 mg/dL — AB (ref 6–20)
CHLORIDE: 114 mmol/L — AB (ref 101–111)
CO2: 20 mmol/L — AB (ref 22–32)
CREATININE: 1.53 mg/dL — AB (ref 0.44–1.00)
Calcium: 9.5 mg/dL (ref 8.9–10.3)
GFR calc Af Amer: 40 mL/min — ABNORMAL LOW (ref >60)
GFR calc non Af Amer: 34 mL/min — ABNORMAL LOW (ref >60)
GLUCOSE: 106 mg/dL — AB (ref 65–99)
POTASSIUM: 4.9 mmol/L (ref 3.5–5.1)
SODIUM: 141 mmol/L (ref 135–145)

## 2016-09-06 LAB — CBC
HEMATOCRIT: 32.2 % — AB (ref 36.0–46.0)
Hemoglobin: 10.5 g/dL — ABNORMAL LOW (ref 12.0–15.0)
MCH: 27.6 pg (ref 26.0–34.0)
MCHC: 32.6 g/dL (ref 30.0–36.0)
MCV: 84.5 fL (ref 78.0–100.0)
PLATELETS: 323 10*3/uL (ref 150–400)
RBC: 3.81 MIL/uL — ABNORMAL LOW (ref 3.87–5.11)
RDW: 14.4 % (ref 11.5–15.5)
WBC: 6 10*3/uL (ref 4.0–10.5)

## 2016-09-06 LAB — GLUCOSE, CAPILLARY
GLUCOSE-CAPILLARY: 130 mg/dL — AB (ref 65–99)
GLUCOSE-CAPILLARY: 127 mg/dL — AB (ref 65–99)
Glucose-Capillary: 108 mg/dL — ABNORMAL HIGH (ref 65–99)
Glucose-Capillary: 170 mg/dL — ABNORMAL HIGH (ref 65–99)

## 2016-09-06 LAB — ECHOCARDIOGRAM COMPLETE
Height: 63 in
WEIGHTICAEL: 3343.94 [oz_av]

## 2016-09-06 LAB — HIV ANTIBODY (ROUTINE TESTING W REFLEX): HIV SCREEN 4TH GENERATION: NONREACTIVE

## 2016-09-06 MED ORDER — ALUM & MAG HYDROXIDE-SIMETH 200-200-20 MG/5ML PO SUSP
15.0000 mL | Freq: Four times a day (QID) | ORAL | Status: DC | PRN
  Administered 2016-09-06: 15 mL via ORAL
  Filled 2016-09-06: qty 30

## 2016-09-06 NOTE — Progress Notes (Signed)
Echocardiogram 2D Echocardiogram has been performed.  Elizabeth Thompson 09/06/2016, 9:03 AM

## 2016-09-06 NOTE — Progress Notes (Signed)
[facility] PROGRESS NOTE  Elizabeth Thompson FVO:[id number] DOB: 30-Aug-1951 DOA: 09/04/2016   PCP: Webb Silversmith, NP     Assessment/Plan: Principal Problem:   Acute on chronic respiratory failure with hypoxemia ([place]) Active Problems:   Schizoaffective disorder (HCC)   Hypercalcemia   Diabetes mellitus type 2, controlled, without complications (HCC)   HLD (hyperlipidemia)   Essential hypertension   GERD (gastroesophageal reflux disease)   Renal malignant tumor ([place])   COPD exacerbation (HCC)   Depression   Acute on chronic respiratory failure with hypoxia ([place])   65 y.o. female admitted with respiratory failure. Pt is on 2-3L O2 24hours at home. PMH of COPD, HTN, DM, schizoaffective d/o, back surgery Aug 2016, renal cancer.who presents with worsening shortness of breath  Assessment and plan  Acute on chronic respiratory failure with hypoxemia: Etiology is not clear. Chest x-ray is negative. No PE by VQ scan. Patient has slightly decreased air movement bilaterally, but otherwise clear on auscultation. Pt dose not seem to have COPD exacerbation. It is possible that the patient just have COPD progression. Patient does not have fever or chills.  -echo: grade 2 diastolic dysfunction -BNP normal Continue-Nebulizers: scheduled Duoneb and prn albuterol -Tessalon for cough  -sputum culture, respiratory virus panel pending -Nasal cannula oxygen to maintain oxygen saturation above 90%  Schizoaffective disorder and depression: stable -Continue Abilify, lamictal  Hypercalcemia: this is chronic issue. Etiology is not clear. Baseline calcium 10.4-10.5. Her calcium is 10.4 today. -Follow-up by BMP  DM-II: Last A1c 7.3, fairly controled. Patient is taking metformin and Amaryl at home -SSI  HLD: Last LDL was not on record -Continue home medications: Lipitor  GERD: -Protonix    DVT prophylaxsis Lovenox  Code Status:    Full code   Family Communication:  patient  Disposition Plan: home in AM?         HPI/Subjective: Breathing better today  Objective: Vitals:   09/05/16 2112 09/06/16 0423 09/06/16 0942 09/06/16 1309  BP: 130/66 128/64  (!) 141/77  Pulse: 81 72  72  Resp: _0 Temp: 97.8 F (36.6 C) 97.8 F (36.6 C)  98.3 F (36.8 C)  TempSrc: Oral Oral  Oral  SpO2: 92% 96% 94% 100%  Weight:      Height:        Intake/Output Summary (Last 24 hours) at 09/06/16 1321 Last data filed at 09/06/16 0900  Gross per 24 hour  Intake              200 ml  Output                0 ml  Net              200 ml    Exam:  Examination:  General exam: Appears calm and comfortable  Respiratory system: Clear to auscultation. Respiratory effort normal. Cardiovascular system: S1 & S2 heard, RRR. No JVD, murmurs, rubs, gallops or clicks. No pedal edema. Gastrointestinal system: Abdomen is nondistended, soft and nontender. No organomegaly or masses felt. Normal bowel sounds heard. Central nervous systemConfused . No focal neurological deficits.      Data Reviewed: I have personally reviewed following labs and imaging studies  Micro Results No results found for this or any previous visit (from the past 240 hour(s)).  Radiology Reports Dg Chest 2 View  Result Date: 09/04/2016 CLINICAL DATA:  SOB x 2 days; hx COPD, bronchitis; diabetic; HTN; ex smoker EXAM: CHEST -  2 VIEW COMPARISON:  05/21/2016 FINDINGS: Lungs clear. Heart size upper limits normal.  Mildly tortuous thoracic aorta. No effusion.  No pneumothorax. Visualized bones unremarkable. IMPRESSION: No acute cardiopulmonary disease. Electronically Signed   By: Lucrezia Europe M.D.   On: 09/04/2016 14:09   Nm Pulmonary Perf And Vent  Result Date: 09/04/2016 CLINICAL DATA:  One month history of shortness of breath worsening over the past week. EXAM: NUCLEAR MEDICINE VENTILATION - PERFUSION LUNG SCAN TECHNIQUE: Ventilation images were obtained in multiple projections using inhaled  aerosol Tc-66mDTPA. Perfusion images were obtained in multiple projections after intravenous injection of Tc-927mAA. RADIOPHARMACEUTICALS:  32.7 mCi Technetium-9978mPA aerosol inhalation and 4.4 mCi Technetium-9m5m IV COMPARISON:  Chest x-ray 09/04/2016 FINDINGS: Ventilation: Heterogeneous ventilation study without focal segmental defect. Mild central deposition of the radiopharmaceutical. Perfusion: Patchy perfusion study but no segmental or subsegmental defects to suggest pulmonary embolism. IMPRESSION: Negative study for pulmonary embolism. Electronically Signed   By: P.  Marijo Sanes.   On: 09/04/2016 17:42     CBC  Recent Labs Lab 09/04/16 1241 09/06/16 0530  WBC 7.4 6.0  HGB 11.9* 10.5*  HCT 37.2 32.2*  PLT 358 323  MCV 86.3 84.5  MCH 27.6 27.6  MCHC 32.0 32.6  RDW 14.7 14.4    Chemistries   Recent Labs Lab 09/04/16 1241 09/05/16 1030 09/06/16 0530  NA 140 140 141  K 5.3* 5.1 4.9  CL 111 110 114*  CO2 21* 24 20*  GLUCOSE 114* 156* 106*  BUN 33* 25* 24*  CREATININE 1.63* 1.57* 1.53*  CALCIUM 10.4* 9.8 9.5  AST  --  17  --   ALT  --  12*  --   ALKPHOS  --  60  --   BILITOT  --  0.6  --    ------------------------------------------------------------------------------------------------------------------ estimated creatinine clearance is 39.6 mL/min (by C-G formula based on SCr of 1.53 mg/dL (H)). ------------------------------------------------------------------------------------------------------------------ No results for input(s): HGBA1C in the last 72 hours. ------------------------------------------------------------------------------------------------------------------ No results for input(s): CHOL, HDL, LDLCALC, TRIG, CHOLHDL, LDLDIRECT in the last 72 hours. ------------------------------------------------------------------------------------------------------------------ No results for input(s): TSH, T4TOTAL, T3FREE, THYROIDAB in the last 72  hours.  Invalid input(s): FREET3 ------------------------------------------------------------------------------------------------------------------ No results for input(s): VITAMINB12, FOLATE, FERRITIN, TIBC, IRON, RETICCTPCT in the last 72 hours.  Coagulation profile No results for input(s): INR, PROTIME in the last 168 hours.   Recent Labs  09/04/16 1417  DDIMER 1.67*    Cardiac Enzymes  Recent Labs Lab 09/05/16 1030  TROPONINI <0.03   ------------------------------------------------------------------------------------------------------------------ Invalid input(s): POCBNP   CBG:  Recent Labs Lab 09/05/16 1213 09/05/16 1715 09/05/16 2109 09/06/16 0754 09/06/16 1152  GLUCAP 119* 191* 110* 108* 170*       Studies: Dg Chest 2 View  Result Date: 09/04/2016 CLINICAL DATA:  SOB x 2 days; hx COPD, bronchitis; diabetic; HTN; ex smoker EXAM: CHEST - 2 VIEW COMPARISON:  05/21/2016 FINDINGS: Lungs clear. Heart size upper limits normal.  Mildly tortuous thoracic aorta. No effusion.  No pneumothorax. Visualized bones unremarkable. IMPRESSION: No acute cardiopulmonary disease. Electronically Signed   By: D  HLucrezia Europe.   On: 09/04/2016 14:09   Nm Pulmonary Perf And Vent  Result Date: 09/04/2016 CLINICAL DATA:  One month history of shortness of breath worsening over the past week. EXAM: NUCLEAR MEDICINE VENTILATION - PERFUSION LUNG SCAN TECHNIQUE: Ventilation images were obtained in multiple projections using inhaled aerosol Tc-9m 75m. Perfusion images were obtained in multiple projections after intravenous injection of Tc-9m M57mRADIOPHARMACEUTICALS:  32.7 mCi  Technetium-31mDTPA aerosol inhalation and 4.4 mCi Technetium-975mAA IV COMPARISON:  Chest x-ray 09/04/2016 FINDINGS: Ventilation: Heterogeneous ventilation study without focal segmental defect. Mild central deposition of the radiopharmaceutical. Perfusion: Patchy perfusion study but no segmental or subsegmental  defects to suggest pulmonary embolism. IMPRESSION: Negative study for pulmonary embolism. Electronically Signed   By: P.Marijo Sanes.D.   On: 09/04/2016 17:42      Lab Results  Component Value Date   HGBA1C 7.3 (H) 06/15/2016   HGBA1C 7.1 (H) 05/21/2016   HGBA1C 8.7 (H) 03/19/2016   Lab Results  Component Value Date   CREATININE 1.53 (H) 09/06/2016       Scheduled Meds: . ARIPiprazole  20 mg Oral QHS  . cholecalciferol  1,000 Units Oral Daily  . donepezil  5 mg Oral QHS  . dorzolamide-timolol  1 drop Both Eyes BID  . enoxaparin (LOVENOX) injection  40 mg Subcutaneous QHS  . hydrOXYzine  25 mg Oral QHS  . insulin aspart  0-5 Units Subcutaneous QHS  . insulin aspart  0-9 Units Subcutaneous TID WC  . ipratropium-albuterol  3 mL Nebulization TID  . irbesartan  300 mg Oral Daily  . lamoTRIgine  100 mg Oral QPC supper  . latanoprost  1 drop Both Eyes QHS  . mirtazapine  7.5 mg Oral QHS  . mometasone-formoterol  2 puff Inhalation BID  . multivitamin with minerals  1 tablet Oral Daily  . pantoprazole  40 mg Oral Daily  . senna  1 tablet Oral Daily  . simvastatin  40 mg Oral QPC supper   Continuous Infusions: . sodium chloride 100 mL/hr at 09/06/16 1225     LOS: 2 days    Time spent: 25[facility] Pager [phone] 7PM-7AM, please contact night-coverage at [email or web address], password [email or web address]/24/2017, 1:21 PM  LOS: 2 days

## 2016-09-06 NOTE — Evaluation (Signed)
Occupational Therapy Evaluation Patient Details Name: Elizabeth Thompson MRN: [id number] DOB: Apr 25, 1951 Today's Date: 09/06/2016    History of Present Illness 65 y.o. female admitted with respiratory failure. Pt is on 2-3L O2 24hours at home. PMH of COPD, HTN, DM, schizoaffective d/o, back surgery Aug 2016, renal cancer.   Clinical Impression   Pt was admitted for the above.  She was mostly independent/mod I with adls at home.  She will benefit from continued Ot in acute to increase safety and independence with adls. Goals are for supervision level    Follow Up Recommendations  Supervision/Assistance - 24 hour    Equipment Recommendations  None recommended by OT    Recommendations for Other Services       Precautions / Restrictions Precautions Precautions: Fall Precaution Comments: O2, h/o multiple falls  Restrictions Weight Bearing Restrictions: No      Mobility Bed Mobility               General bed mobility comments: at eob  Transfers   Equipment used: Rolling walker (2 wheeled)   Sit to Stand: Min guard         General transfer comment: for safety/lines    Balance Overall balance assessment: History of Falls                                          ADL Overall ADL's : Needs assistance/impaired     Grooming: Oral care;Set up;Standing   Upper Body Bathing: Set up;Sitting   Lower Body Bathing: Minimal assistance;Sit to/from stand   Upper Body Dressing : Minimal assistance;Sitting (lines)   Lower Body Dressing: Minimal assistance;Sit to/from stand   Toilet Transfer: Min guard;Stand-pivot (to chair)             General ADL Comments: performed ADL from EOB.  Transferred to chair at end of session.  Educated on energy conservation.  Pt has been able to sit at bottom of tub and get up to rinse off with shower head prior to admission. Pt takes rest breaks during session.  Used 02, no dyspnea noted     Vision      Perception     Praxis      Pertinent Vitals/Pain Pain Assessment: No/denies pain     Hand Dominance Right   Extremity/Trunk Assessment Upper Extremity Assessment Upper Extremity Assessment: Overall WFL for tasks assessed           Communication Communication Communication: No difficulties   Cognition Arousal/Alertness: Awake/alert Behavior During Therapy: WFL for tasks assessed/performed Overall Cognitive Status: Within Functional Limits for tasks assessed                     General Comments       Exercises       Shoulder Instructions      Home Living Family/patient expects to be discharged to:: Private residence Living Arrangements: Other relatives Available Help at Discharge: Family;Available 24 hours/day;Available PRN/intermittently               Bathroom Shower/Tub: Tub/shower unit Shower/tub characteristics: Architectural technologist: Standard     Home Equipment: Civil engineer, contracting;Wheelchair - [employer] - 2 wheels;Walker - 4 wheels   Additional Comments: lives with niece who is CG, on home O2      Prior Functioning/Environment Level of Independence: Independent with assistive device(s)  Comments: walks with rollator, independent with bathing/dressing        OT Problem List: Decreased strength;Decreased activity tolerance;Impaired balance (sitting and/or standing);Pain;Decreased knowledge of use of DME or AE   OT Treatment/Interventions: Self-care/ADL training;DME and/or AE instruction;Balance training;Patient/family education    OT Goals(Current goals can be found in the care plan section) Acute Rehab OT Goals Patient Stated Goal: to walk outside OT Goal Formulation: With patient Time For Goal Achievement: 09/13/16 Potential to Achieve Goals: Good ADL Goals Pt Will Perform Lower Body Bathing: with supervision;with set-up;sit to/from stand Pt Will Perform Lower Body Dressing: with set-up;with supervision;sit to/from  stand Pt Will Transfer to Toilet: with supervision;ambulating;regular height toilet Pt Will Perform Toileting - Clothing Manipulation and hygiene: sit to/from stand;with modified independence Additional ADL Goal #1: pt will verbalize 3 energy conservation strategies.  OT Frequency: Min 2X/week   Barriers to D/C:            Co-evaluation              End of Session Nurse Communication:  (tele box lost connection; fine now)  Activity Tolerance: Patient tolerated treatment well Patient left: in chair;with call bell/phone within reach   Time: 2707-8675 OT Time Calculation (min): 34 min Charges:  OT General Charges $OT Visit: 1 Procedure OT Evaluation $OT Eval Moderate Complexity: 1 Procedure OT Treatments $Self Care/Home Management : 8-22 mins $Therapeutic Activity: 8-22 mins G-Codes:    Adabelle Griffiths September 16, 2016, 9:56 AM  Lesle Chris, OTR/L [phone] 09/16/2016

## 2016-09-07 ENCOUNTER — Inpatient Hospital Stay (HOSPITAL_COMMUNITY): Payer: Medicare Other

## 2016-09-07 LAB — GLUCOSE, CAPILLARY
GLUCOSE-CAPILLARY: 125 mg/dL — AB (ref 65–99)
Glucose-Capillary: 110 mg/dL — ABNORMAL HIGH (ref 65–99)
Glucose-Capillary: 104 mg/dL — ABNORMAL HIGH (ref 65–99)
Glucose-Capillary: 141 mg/dL — ABNORMAL HIGH (ref 65–99)

## 2016-09-07 LAB — URINALYSIS, ROUTINE W REFLEX MICROSCOPIC
Bilirubin Urine: NEGATIVE
Glucose, UA: NEGATIVE mg/dL
Hgb urine dipstick: NEGATIVE
Ketones, ur: NEGATIVE mg/dL
Leukocytes, UA: NEGATIVE
Nitrite: NEGATIVE
Protein, ur: NEGATIVE mg/dL
Specific Gravity, Urine: 1.007 (ref 1.005–1.030)
pH: 5.5 (ref 5.0–8.0)

## 2016-09-07 MED ORDER — SORBITOL 70 % SOLN
960.0000 mL | TOPICAL_OIL | Freq: Once | ORAL | Status: DC
  Filled 2016-09-07: qty 240

## 2016-09-07 MED ORDER — POLYETHYLENE GLYCOL 3350 17 G PO PACK
17.0000 g | PACK | Freq: Every day | ORAL | Status: DC
  Administered 2016-09-07 – 2016-09-08 (×2): 17 g via ORAL
  Filled 2016-09-07 (×2): qty 1

## 2016-09-07 MED ORDER — ONDANSETRON HCL 4 MG/2ML IJ SOLN
4.0000 mg | Freq: Once | INTRAMUSCULAR | Status: AC
  Administered 2016-09-07: 4 mg via INTRAVENOUS
  Filled 2016-09-07: qty 2

## 2016-09-07 MED ORDER — BISACODYL 10 MG RE SUPP
10.0000 mg | Freq: Every day | RECTAL | Status: DC | PRN
  Administered 2016-09-07: 10 mg via RECTAL
  Filled 2016-09-07: qty 1

## 2016-09-07 MED ORDER — LACTULOSE 10 GM/15ML PO SOLN
20.0000 g | Freq: Two times a day (BID) | ORAL | Status: DC
  Administered 2016-09-07 – 2016-09-08 (×2): 20 g via ORAL
  Filled 2016-09-07 (×2): qty 30

## 2016-09-07 NOTE — Progress Notes (Signed)
Pt had very large, watery bowel movement this evening. Refusing smog enema right now, will reassess in the morning.

## 2016-09-07 NOTE — Progress Notes (Signed)
Patient C/O sudden nausea and abdominal pain, can't take PRN PO  Nausea medication, patient assisted back in bed and Dr. Eliseo Squires notified IV Zofran ordered, Will continue to monitor patient.

## 2016-09-07 NOTE — Progress Notes (Signed)
Discharge held for nausea-- zofran given, will follow  Eulogio Bear DO

## 2016-09-07 NOTE — Progress Notes (Signed)
Pt still had orders for telemetry, previous RN removed due to discharge orders. Pt d/c delayed due to abdominal pain. On-call paged to clarify telemetry orders, K. Schorr states to d/c telemetry. Will continue to monitor.

## 2016-09-07 NOTE — Progress Notes (Signed)
SATURATION QUALIFICATIONS: (This note is used to comply with regulatory documentation for home oxygen)  Patient Saturations on Room Air at Rest = 88%  Patient Saturations on Room Air while Ambulating = 85%  Patient Saturations on 2 Liters of oxygen while Ambulating = 91%  Please briefly explain why patient needs home oxygen: Shortness of breath with any activity.

## 2016-09-07 NOTE — Progress Notes (Addendum)
Pt rec Home O2 from [place].  [facility] [phone]) was called for portable tank.

## 2016-09-07 NOTE — Progress Notes (Signed)
Physical Therapy Treatment Patient Details Name: Elizabeth Thompson MRN: [id number] DOB: 05-06-1951 Today's Date: 09/07/2016    History of Present Illness 65 y.o. female admitted with respiratory failure. Pt is on 2-3L O2 24hours at home. PMH of COPD, HTN, DM, schizoaffective d/o, back surgery Aug 2016, renal cancer.    PT Comments    Pt ambulated in hallway and was assisted to chair. Pt required 2L O2 to ambulate approx 200 feet; required rest break at 100 feet to raise SpO2. Pt initial SpO2 on 2L O2 was 90%; then measured on room air at 90%. Pt on oxygen at home, but was curious to see what her SpO2 would be on room air with PT present. Pt performed sit to stand on room air, SpO2 at 83%, 2L O2 was resumed and SpO2 raised to 88%. During gait, SpO2 dropped to 84% on 2L O2 but raised to 88% s/p rest break. Once resting post-gait, SpO2 was 91% on 2L O2. Pt was agreeable to participating in additional acute PT to help resolve lowered activity tolerance.  Follow Up Recommendations  Home health PT     Equipment Recommendations  None recommended by PT    Recommendations for Other Services       Precautions / Restrictions Precautions Precautions: Fall Precaution Comments: O2, h/o multiple falls  Restrictions Weight Bearing Restrictions: No    Mobility  Bed Mobility Overal bed mobility: Needs Assistance Bed Mobility: Supine to Sit     Supine to sit: Supervision     General bed mobility comments: supervision for safety  Transfers Overall transfer level: Needs assistance Equipment used: None Transfers: Sit to/from Stand Sit to Stand: Min guard         General transfer comment: guarding for safety; verbal cues to manage lines/leads  Ambulation/Gait Ambulation/Gait assistance: Min guard Ambulation Distance (Feet): 200 Feet Assistive device: None Gait Pattern/deviations: Step-through pattern;Decreased stride length     General Gait Details: guarding for safety; verbal cues  for management of IV; required rest break at approx 100 feet due to drop in SpO2; SpO2 monitored thorughout gait on 2L O2   Stairs            Wheelchair Mobility    Modified Rankin (Stroke Patients Only)       Balance                                    Cognition Arousal/Alertness: Awake/alert Behavior During Therapy: WFL for tasks assessed/performed Overall Cognitive Status: Within Functional Limits for tasks assessed                      Exercises      General Comments        Pertinent Vitals/Pain Pain Assessment: No/denies pain    Home Living                      Prior Function            PT Goals (current goals can now be found in the care plan section) Progress towards PT goals: Progressing toward goals    Frequency    Min 3X/week      PT Plan Current plan remains appropriate    Co-evaluation             End of Session Equipment Utilized During Treatment: Gait belt;Oxygen Activity Tolerance: Patient tolerated treatment well Patient  left: in chair;with call bell/phone within reach;with chair alarm set     Time: 1859-0931 PT Time Calculation (min) (ACUTE ONLY): 16 min  Charges:  $Gait Training: 8-22 mins                    G Codes:      Dewitt Hoes 09/30/2016, 2:11 PM Dewitt Hoes, SPT

## 2016-09-07 NOTE — Discharge Summary (Addendum)
Physician Discharge Summary  Elizabeth Thompson ENI:[id number] DOB: [birth year] DOA: 09/04/2016  PCP: Webb Silversmith, NP  Admit date: 09/04/2016 Discharge date: 09/08/2016   Recommendations for Outpatient Follow-Up:   1. Home health 2. Resume home O2 3. pulm follow up with Dr. Melvyn Novas 4. Bowel regimen   Discharge Diagnosis:   Principal Problem:   Acute on chronic respiratory failure with hypoxemia (HCC) Active Problems:   Schizoaffective disorder (HCC)   Hypercalcemia   Diabetes mellitus type 2, controlled, without complications (HCC)   HLD (hyperlipidemia)   Essential hypertension   GERD (gastroesophageal reflux disease)   Renal malignant tumor (HCC)   COPD exacerbation (HCC)   Depression   Acute on chronic respiratory failure with hypoxia [facility])   Discharge disposition:  Home.  Discharge Condition: Improved.  Diet recommendation: Low sodium, heart healthy.  Carbohydrate-modified.  Wound care: None.   History of Present Illness:   Elizabeth Thompson is a 65 y.o. female with medical history significant of hypertension, hyperlipidemia, diabetes mellitus, COPD, on home oxygen 2-3 L, GERD, hypercalcemia, formal smoker, bipolar, depression, back pain, renal cell carcinoma (S/P left nephrectomy 06/2016, no radiation and chemotherapy), who presents with worsening shortness of breath.  Patient states that she has worsening shortness of breath which has been going on for 3 weeks. She coughs up dlear and thick mucus. No fever, chills, chest pain, runny nose throat. Patient denies nausea, vomiting, abdominal pain. She states that she has intermittent diarrhea which is alleviated by eating yogurt. She had one BM with loose stool today. No symptoms of UTI or unilateral weakness. Pt was seen by her pulmonologist, Dr. Melvyn Novas today, who switched lisinopril to Irbesartan, and recommended admission.   Hospital Course by Problem:   Acute on chronic respiratory failure with hypoxemia:Etiology  is not clear. Chest x-ray is negative. No PE by VQ scan. Patient has slightly decreased air movement bilaterally, but otherwise clear on auscultation. Pt dose not seem to have COPD exacerbation.It is possible that the patient justhave COPD progression. Patient does not have fever or chills.  -echo: grade 2 diastolic dysfunction but BNP normal- weight appears stable Continue-Nebulizers: scheduled Duoneb and prn albuterol -Tessalonfor cough  - respiratory virus panel negative -Nasal cannula oxygen to maintain oxygen saturation above 90%- re-order home O2-- was put on O2 3-4 weeks ago  Schizoaffective disorder and depression:stable -Continue Abilify, lamictal  Hypercalcemia:this is chronic issue. Etiology is not clear. Baseline calcium 10.4-10.5. Her calcium is 10.4 today. -Follow-up by BMP  DM-II:Last A1c 7.3, fairly controled. Patient is taking metformin and Amaryl at home -SSI  IRW:ERXV LDL was not on record -Continue home medications: Lipitor  GERD: -Protonix  Constipation -bowel regimen ordered    Medical Consultants:    None.    Discharge Exam:   Vitals:   09/07/16 2020 09/08/16 0551  BP: (!) 158/98 (!) 115/58  Pulse: 87 72  Resp: 20 20  Temp: 98.6 F (37 C) 97.8 F (36.6 C)   Vitals:   09/07/16 1448 09/07/16 2020 09/08/16 0551 09/08/16 0829  BP: (!) 156/84 (!) 158/98 (!) 115/58   Pulse: 78 87 72   Resp:  20 20   Temp: 97.7 F (36.5 C) 98.6 F (37 C) 97.8 F (36.6 C)   TempSrc: Oral Oral Oral   SpO2:  95% 97% 93%  Weight:      Height:        Gen:  NAD Cardiovascular:  RRR, No M/R/G Respiratory: diminished, no wheezing   The results of  significant diagnostics from this hospitalization (including imaging, microbiology, ancillary and laboratory) are listed below for reference.     Procedures and Diagnostic Studies:   Dg Chest 2 View  Result Date: 09/04/2016 CLINICAL DATA:  SOB x 2 days; hx COPD, bronchitis; diabetic; HTN; ex smoker  EXAM: CHEST - 2 VIEW COMPARISON:  05/21/2016 FINDINGS: Lungs clear. Heart size upper limits normal.  Mildly tortuous thoracic aorta. No effusion.  No pneumothorax. Visualized bones unremarkable. IMPRESSION: No acute cardiopulmonary disease. Electronically Signed   By: Lucrezia Europe M.D.   On: 09/04/2016 14:09   Nm Pulmonary Perf And Vent  Result Date: 09/04/2016 CLINICAL DATA:  One month history of shortness of breath worsening over the past week. EXAM: NUCLEAR MEDICINE VENTILATION - PERFUSION LUNG SCAN TECHNIQUE: Ventilation images were obtained in multiple projections using inhaled aerosol Tc-38mDTPA. Perfusion images were obtained in multiple projections after intravenous injection of Tc-980mAA. RADIOPHARMACEUTICALS:  32.7 mCi Technetium-9923mPA aerosol inhalation and 4.4 mCi Technetium-4m22m IV COMPARISON:  Chest x-ray 09/04/2016 FINDINGS: Ventilation: Heterogeneous ventilation study without focal segmental defect. Mild central deposition of the radiopharmaceutical. Perfusion: Patchy perfusion study but no segmental or subsegmental defects to suggest pulmonary embolism. IMPRESSION: Negative study for pulmonary embolism. Electronically Signed   By: P.  Marijo Sanes.   On: 09/04/2016 17:42     Labs:   Basic Metabolic Panel:  Recent Labs Lab 09/04/16 1241 09/05/16 1030 09/06/16 0530  NA 140 140 141  K 5.3* 5.1 4.9  CL 111 110 114*  CO2 21* 24 20*  GLUCOSE 114* 156* 106*  BUN 33* 25* 24*  CREATININE 1.63* 1.57* 1.53*  CALCIUM 10.4* 9.8 9.5   GFR Estimated Creatinine Clearance: 39.6 mL/min (by C-G formula based on SCr of 1.53 mg/dL (H)). Liver Function Tests:  Recent Labs Lab 09/05/16 1030  AST 17  ALT 12*  ALKPHOS 60  BILITOT 0.6  PROT 7.7  ALBUMIN 3.8   No results for input(s): LIPASE, AMYLASE in the last 168 hours. No results for input(s): AMMONIA in the last 168 hours. Coagulation profile No results for input(s): INR, PROTIME in the last 168 hours.  CBC:  Recent  Labs Lab 09/04/16 1241 09/06/16 0530  WBC 7.4 6.0  HGB 11.9* 10.5*  HCT 37.2 32.2*  MCV 86.3 84.5  PLT 358 323   Cardiac Enzymes:  Recent Labs Lab 09/05/16 1030  TROPONINI <0.03   BNP: Invalid input(s): POCBNP CBG:  Recent Labs Lab 09/07/16 0758 09/07/16 1152 09/07/16 1626 09/07/16 2050 09/08/16 0743  GLUCAP 110* 125* 104* 141* 112*   D-Dimer No results for input(s): DDIMER in the last 72 hours. Hgb A1c No results for input(s): HGBA1C in the last 72 hours. Lipid Profile No results for input(s): CHOL, HDL, LDLCALC, TRIG, CHOLHDL, LDLDIRECT in the last 72 hours. Thyroid function studies No results for input(s): TSH, T4TOTAL, T3FREE, THYROIDAB in the last 72 hours.  Invalid input(s): FREET3 Anemia work up No results for input(s): VITAMINB12, FOLATE, FERRITIN, TIBC, IRON, RETICCTPCT in the last 72 hours. Microbiology Recent Results (from the past 240 hour(s))  Respiratory Panel by PCR     Status: None   Collection Time: 09/05/16  2:28 PM  Result Value Ref Range Status   Adenovirus NOT DETECTED NOT DETECTED Final   Coronavirus 229E NOT DETECTED NOT DETECTED Final   Coronavirus HKU1 NOT DETECTED NOT DETECTED Final   Coronavirus NL63 NOT DETECTED NOT DETECTED Final   Coronavirus OC43 NOT DETECTED NOT DETECTED Final   Metapneumovirus NOT DETECTED  NOT DETECTED Final   Rhinovirus / Enterovirus NOT DETECTED NOT DETECTED Final   Influenza A NOT DETECTED NOT DETECTED Final   Influenza B NOT DETECTED NOT DETECTED Final   Parainfluenza Virus 1 NOT DETECTED NOT DETECTED Final   Parainfluenza Virus 2 NOT DETECTED NOT DETECTED Final   Parainfluenza Virus 3 NOT DETECTED NOT DETECTED Final   Parainfluenza Virus 4 NOT DETECTED NOT DETECTED Final   Respiratory Syncytial Virus NOT DETECTED NOT DETECTED Final   Bordetella pertussis NOT DETECTED NOT DETECTED Final   Chlamydophila pneumoniae NOT DETECTED NOT DETECTED Final   Mycoplasma pneumoniae NOT DETECTED NOT DETECTED Final      Comment: Performed at [facility]     Discharge Instructions:   Discharge Instructions    Diet - low sodium heart healthy    Complete by:  As directed    Diet Carb Modified    Complete by:  As directed    Discharge instructions    Complete by:  As directed    Resume O2 2-3 L Home health with family supervision   Increase activity slowly    Complete by:  As directed        Medication List    STOP taking these medications   BIOFREEZE 10 % Liqd Generic drug:  Menthol (Topical Analgesic)   ibuprofen 200 MG tablet Commonly known as:  ADVIL,MOTRIN     TAKE these medications   albuterol (2.5 MG/3ML) 0.083% nebulizer solution Commonly known as:  PROVENTIL Take 2.5 mg by nebulization every 6 (six) hours as needed for wheezing or shortness of breath.   albuterol 108 (90 Base) MCG/ACT inhaler Commonly known as:  PROVENTIL HFA;VENTOLIN HFA Inhale 2 puffs into the lungs every 6 (six) hours as needed for wheezing or shortness of breath.   ARIPiprazole 20 MG tablet Commonly known as:  ABILIFY Take 1 tablet (20 mg total) by mouth at bedtime.   benzonatate 100 MG capsule Commonly known as:  TESSALON Take 1 capsule (100 mg total) by mouth 2 (two) times daily as needed for cough.   bisacodyl 10 MG suppository Commonly known as:  DULCOLAX Place 1 suppository (10 mg total) rectally daily as needed for moderate constipation.   budesonide-formoterol 160-4.5 MCG/ACT inhaler Commonly known as:  SYMBICORT Inhale 2 puffs into the lungs 2 (two) times daily.   donepezil 5 MG tablet Commonly known as:  ARICEPT Take 1 tablet (5 mg total) by mouth at bedtime.   dorzolamide-timolol 22.3-6.8 MG/ML ophthalmic solution Commonly known as:  COSOPT Place 1 drop into both eyes 2 (two) times daily.   esomeprazole 20 MG capsule Commonly known as:  NEXIUM Take 20 mg by mouth daily.   glimepiride 4 MG tablet Commonly known as:  AMARYL Take 1 tablet (4 mg total) by mouth daily  with breakfast.   glucose blood test strip Commonly known as:  ACCU-CHEK AVIVA 1 each by Other route 2 (two) times daily.   hydrOXYzine 25 MG tablet Commonly known as:  ATARAX/VISTARIL Take 25 mg by mouth at bedtime.   Ipratropium-Albuterol 20-100 MCG/ACT Aers respimat Commonly known as:  COMBIVENT Inhale 1 puff into the lungs every 6 (six) hours.   irbesartan 300 MG tablet Commonly known as:  AVAPRO Take 1 tablet (300 mg total) by mouth daily.   lactulose 10 GM/15ML solution Commonly known as:  CHRONULAC Take 30 mLs (20 g total) by mouth 2 (two) times daily as needed for mild constipation.   lamoTRIgine 100 MG tablet Commonly known  as:  LAMICTAL Take 1 tablet (100 mg total) by mouth every evening. What changed:  when to take this   latanoprost 0.005 % ophthalmic solution Commonly known as:  XALATAN Place 1 drop into both eyes at bedtime.   metFORMIN 1000 MG tablet Commonly known as:  GLUCOPHAGE Take 1 tablet (1,000 mg total) by mouth 2 (two) times daily with a meal.   methocarbamol 500 MG tablet Commonly known as:  ROBAXIN Take 1 tablet (500 mg total) by mouth 3 (three) times daily as needed for muscle spasms.   mirtazapine 7.5 MG tablet Commonly known as:  REMERON Take 1 tablet (7.5 mg total) by mouth at bedtime.   multivitamin capsule Take 1 capsule by mouth daily.   ondansetron 4 MG tablet Commonly known as:  ZOFRAN Take 1 tablet (4 mg total) by mouth every 8 (eight) hours as needed for nausea or vomiting.   OXYGEN Inhale 3 L into the lungs at bedtime.   polyethylene glycol packet Commonly known as:  MIRALAX / GLYCOLAX Take 17 g by mouth daily.   Selenium Sulf-Pyrithione-Urea 2.25 % Sham Apply 1 application topically every 14 (fourteen) days. Applied to scalp for dermatitis.   senna 8.6 MG tablet Commonly known as:  SENOKOT Take 1 tablet by mouth daily.   simvastatin 40 MG tablet Commonly known as:  ZOCOR Take 1 tablet (40 mg total) by mouth  daily. What changed:  when to take this   VITAMIN D3 PO Take 1 tablet by mouth daily.      Follow-up Information    BAITY, REGINA, NP Follow up in 1 week(s).   Specialty:  Internal Medicine Contact information: [place] [address] [phone]        Christinia Gully, MD Follow up in 4 week(s).   Specialty:  Pulmonary Disease Contact information: [address] [place] [address] [phone]            Time coordinating discharge: 35 min  Signed:  Aletheia Tangredi Alison Stalling   [facility] 09/08/2016, 11:27 AM

## 2016-09-08 LAB — GLUCOSE, CAPILLARY
GLUCOSE-CAPILLARY: 103 mg/dL — AB (ref 65–99)
Glucose-Capillary: 112 mg/dL — ABNORMAL HIGH (ref 65–99)
Glucose-Capillary: 220 mg/dL — ABNORMAL HIGH (ref 65–99)

## 2016-09-08 MED ORDER — FLEET ENEMA 7-19 GM/118ML RE ENEM
1.0000 | ENEMA | Freq: Once | RECTAL | Status: AC
  Administered 2016-09-08: 1 via RECTAL
  Filled 2016-09-08: qty 1

## 2016-09-08 MED ORDER — LACTULOSE 10 GM/15ML PO SOLN: 20.0000 g | Freq: Two times a day (BID) | ORAL | 0 refills | Status: DC | PRN

## 2016-09-08 MED ORDER — BISACODYL 10 MG RE SUPP: 10.0000 mg | Freq: Every day | RECTAL | 0 refills | Status: AC | PRN

## 2016-09-08 MED ORDER — POLYETHYLENE GLYCOL 3350 17 G PO PACK: 17.0000 g | PACK | Freq: Every day | ORAL | 0 refills | Status: AC

## 2016-09-08 NOTE — Consult Note (Signed)
   [facility] CM Inpatient Consult   09/08/2016  Elizabeth Thompson 01/22/1951 [id number]    Patient screened for [place] Management services. Went to bedside to offer and explain [facility] Care Management program with patient. Patient declined [place] [facility] follow up. She denies issues with obtaining medications or with transportation. States she has good support and denies any community resource needs. Accepted [facility] Care Management brochure with contact information to call in future if changes mind. Made inpatient RNCM aware that patient declined [place] Management program services.  Marthenia Rolling, MSN-Ed, RN,BSN [facility] Liaison [phone]

## 2016-09-08 NOTE — Discharge Summary (Addendum)
Physician Discharge Summary  Elizabeth Thompson OMV:[id number] DOB: 1951/02/09 DOA: 09/04/2016  PCP: Webb Silversmith, NP  Admit date: 09/04/2016 Discharge date: 09/08/2016   Recommendations for Outpatient Follow-Up:   1. Home health 2. Resume home O2 3. pulm follow up with Dr. Melvyn Novas 4. Bowel regimen   Discharge Diagnosis:   Principal Problem:   Acute on chronic respiratory failure with hypoxemia (HCC) Active Problems:   Schizoaffective disorder (HCC)   Hypercalcemia   Diabetes mellitus type 2, controlled, without complications (HCC)   HLD (hyperlipidemia)   Essential hypertension   GERD (gastroesophageal reflux disease)   Renal malignant tumor (HCC)   COPD exacerbation (HCC)   Depression   Acute on chronic respiratory failure with hypoxia [facility])   Discharge disposition:  Home.  Discharge Condition: Improved.  Diet recommendation: Low sodium, heart healthy.  Carbohydrate-modified.  Wound care: None.   History of Present Illness:   Elizabeth Thompson is a 65 y.o. female with medical history significant of hypertension, hyperlipidemia, diabetes mellitus, COPD, on home oxygen 2-3 L, GERD, hypercalcemia, formal smoker, bipolar, depression, back pain, renal cell carcinoma (S/P left nephrectomy 06/2016, no radiation and chemotherapy), who presents with worsening shortness of breath.  Patient states that she has worsening shortness of breath which has been going on for 3 weeks. She coughs up dlear and thick mucus. No fever, chills, chest pain, runny nose throat. Patient denies nausea, vomiting, abdominal pain. She states that she has intermittent diarrhea which is alleviated by eating yogurt. She had one BM with loose stool today. No symptoms of UTI or unilateral weakness. Pt was seen by her pulmonologist, Dr. Melvyn Novas today, who switched lisinopril to Irbesartan, and recommended admission.   Hospital Course by Problem:   Acute on chronic respiratory failure with hypoxemia:Etiology  is not clear. Chest x-ray is negative. No PE by VQ scan. Patient has slightly decreased air movement bilaterally, but otherwise clear on auscultation. Pt dose not seem to have COPD exacerbation.It is possible that the patient justhave COPD progression. Patient does not have fever or chills.  -echo: grade 2 diastolic dysfunction but BNP normal- weight appears stable Continue-Nebulizers: scheduled Duoneb and prn albuterol -Tessalonfor cough  - respiratory virus panel negative -Nasal cannula oxygen to maintain oxygen saturation above 90%- re-order home O2-- was put on O2 3-4 weeks ago  Schizoaffective disorder and depression:stable -Continue Abilify, lamictal  Hypercalcemia:this is chronic issue. Etiology is not clear. Baseline calcium 10.4-10.5. Her calcium is 10.4 today. -Follow-up by BMP  DM-II:Last A1c 7.3, fairly controled. Patient is taking metformin and Amaryl at home -SSI  GGE:ZMOQ LDL was not on record -Continue home medications: Lipitor  GERD: -Protonix  Constipation -bowel regimen ordered    Medical Consultants:    None.    Discharge Exam:   Vitals:   09/07/16 2020 09/08/16 0551  BP: (!) 158/98 (!) 115/58  Pulse: 87 72  Resp: 20 20  Temp: 98.6 F (37 C) 97.8 F (36.6 C)   Vitals:   09/07/16 2020 09/08/16 0551 09/08/16 0829 09/08/16 1422  BP: (!) 158/98 (!) 115/58    Pulse: 87 72    Resp: 20 20    Temp: 98.6 F (37 C) 97.8 F (36.6 C)    TempSrc: Oral Oral    SpO2: 95% 97% 93% 92%  Weight:      Height:        Gen:  NAD Cardiovascular:  RRR, No M/R/G Respiratory: diminished, no wheezing   The results of significant diagnostics from this  hospitalization (including imaging, microbiology, ancillary and laboratory) are listed below for reference.     Procedures and Diagnostic Studies:   Dg Chest 2 View  Result Date: 09/04/2016 CLINICAL DATA:  SOB x 2 days; hx COPD, bronchitis; diabetic; HTN; ex smoker EXAM: CHEST - 2 VIEW  COMPARISON:  05/21/2016 FINDINGS: Lungs clear. Heart size upper limits normal.  Mildly tortuous thoracic aorta. No effusion.  No pneumothorax. Visualized bones unremarkable. IMPRESSION: No acute cardiopulmonary disease. Electronically Signed   By: Lucrezia Europe M.D.   On: 09/04/2016 14:09   Nm Pulmonary Perf And Vent  Result Date: 09/04/2016 CLINICAL DATA:  One month history of shortness of breath worsening over the past week. EXAM: NUCLEAR MEDICINE VENTILATION - PERFUSION LUNG SCAN TECHNIQUE: Ventilation images were obtained in multiple projections using inhaled aerosol Tc-75mDTPA. Perfusion images were obtained in multiple projections after intravenous injection of Tc-929mAA. RADIOPHARMACEUTICALS:  32.7 mCi Technetium-9976mPA aerosol inhalation and 4.4 mCi Technetium-20m4m IV COMPARISON:  Chest x-ray 09/04/2016 FINDINGS: Ventilation: Heterogeneous ventilation study without focal segmental defect. Mild central deposition of the radiopharmaceutical. Perfusion: Patchy perfusion study but no segmental or subsegmental defects to suggest pulmonary embolism. IMPRESSION: Negative study for pulmonary embolism. Electronically Signed   By: P.  Marijo Sanes.   On: 09/04/2016 17:42     Labs:   Basic Metabolic Panel:  Recent Labs Lab 09/04/16 1241 09/05/16 1030 09/06/16 0530  NA 140 140 141  K 5.3* 5.1 4.9  CL 111 110 114*  CO2 21* 24 20*  GLUCOSE 114* 156* 106*  BUN 33* 25* 24*  CREATININE 1.63* 1.57* 1.53*  CALCIUM 10.4* 9.8 9.5   GFR Estimated Creatinine Clearance: 39.6 mL/min (by C-G formula based on SCr of 1.53 mg/dL (H)). Liver Function Tests:  Recent Labs Lab 09/05/16 1030  AST 17  ALT 12*  ALKPHOS 60  BILITOT 0.6  PROT 7.7  ALBUMIN 3.8   No results for input(s): LIPASE, AMYLASE in the last 168 hours. No results for input(s): AMMONIA in the last 168 hours. Coagulation profile No results for input(s): INR, PROTIME in the last 168 hours.  CBC:  Recent Labs Lab  09/04/16 1241 09/06/16 0530  WBC 7.4 6.0  HGB 11.9* 10.5*  HCT 37.2 32.2*  MCV 86.3 84.5  PLT 358 323   Cardiac Enzymes:  Recent Labs Lab 09/05/16 1030  TROPONINI <0.03   BNP: Invalid input(s): POCBNP CBG:  Recent Labs Lab 09/07/16 1152 09/07/16 1626 09/07/16 2050 09/08/16 0743 09/08/16 1134  GLUCAP 125* 104* 141* 112* 220*   D-Dimer No results for input(s): DDIMER in the last 72 hours. Hgb A1c No results for input(s): HGBA1C in the last 72 hours. Lipid Profile No results for input(s): CHOL, HDL, LDLCALC, TRIG, CHOLHDL, LDLDIRECT in the last 72 hours. Thyroid function studies No results for input(s): TSH, T4TOTAL, T3FREE, THYROIDAB in the last 72 hours.  Invalid input(s): FREET3 Anemia work up No results for input(s): VITAMINB12, FOLATE, FERRITIN, TIBC, IRON, RETICCTPCT in the last 72 hours. Microbiology Recent Results (from the past 240 hour(s))  Respiratory Panel by PCR     Status: None   Collection Time: 09/05/16  2:28 PM  Result Value Ref Range Status   Adenovirus NOT DETECTED NOT DETECTED Final   Coronavirus 229E NOT DETECTED NOT DETECTED Final   Coronavirus HKU1 NOT DETECTED NOT DETECTED Final   Coronavirus NL63 NOT DETECTED NOT DETECTED Final   Coronavirus OC43 NOT DETECTED NOT DETECTED Final   Metapneumovirus NOT DETECTED NOT DETECTED Final  Rhinovirus / Enterovirus NOT DETECTED NOT DETECTED Final   Influenza A NOT DETECTED NOT DETECTED Final   Influenza B NOT DETECTED NOT DETECTED Final   Parainfluenza Virus 1 NOT DETECTED NOT DETECTED Final   Parainfluenza Virus 2 NOT DETECTED NOT DETECTED Final   Parainfluenza Virus 3 NOT DETECTED NOT DETECTED Final   Parainfluenza Virus 4 NOT DETECTED NOT DETECTED Final   Respiratory Syncytial Virus NOT DETECTED NOT DETECTED Final   Bordetella pertussis NOT DETECTED NOT DETECTED Final   Chlamydophila pneumoniae NOT DETECTED NOT DETECTED Final   Mycoplasma pneumoniae NOT DETECTED NOT DETECTED Final     Comment: Performed at [facility]     Discharge Instructions:   Discharge Instructions    Diet - low sodium heart healthy    Complete by:  As directed    Diet Carb Modified    Complete by:  As directed    Discharge instructions    Complete by:  As directed    Resume O2 2-3 L Home health with family supervision   Increase activity slowly    Complete by:  As directed        Medication List    STOP taking these medications   BIOFREEZE 10 % Liqd Generic drug:  Menthol (Topical Analgesic)   ibuprofen 200 MG tablet Commonly known as:  ADVIL,MOTRIN     TAKE these medications   albuterol (2.5 MG/3ML) 0.083% nebulizer solution Commonly known as:  PROVENTIL Take 2.5 mg by nebulization every 6 (six) hours as needed for wheezing or shortness of breath.   albuterol 108 (90 Base) MCG/ACT inhaler Commonly known as:  PROVENTIL HFA;VENTOLIN HFA Inhale 2 puffs into the lungs every 6 (six) hours as needed for wheezing or shortness of breath.   ARIPiprazole 20 MG tablet Commonly known as:  ABILIFY Take 1 tablet (20 mg total) by mouth at bedtime.   benzonatate 100 MG capsule Commonly known as:  TESSALON Take 1 capsule (100 mg total) by mouth 2 (two) times daily as needed for cough.   bisacodyl 10 MG suppository Commonly known as:  DULCOLAX Place 1 suppository (10 mg total) rectally daily as needed for moderate constipation.   budesonide-formoterol 160-4.5 MCG/ACT inhaler Commonly known as:  SYMBICORT Inhale 2 puffs into the lungs 2 (two) times daily.   donepezil 5 MG tablet Commonly known as:  ARICEPT Take 1 tablet (5 mg total) by mouth at bedtime.   dorzolamide-timolol 22.3-6.8 MG/ML ophthalmic solution Commonly known as:  COSOPT Place 1 drop into both eyes 2 (two) times daily.   esomeprazole 20 MG capsule Commonly known as:  NEXIUM Take 20 mg by mouth daily.   glimepiride 4 MG tablet Commonly known as:  AMARYL Take 1 tablet (4 mg total) by mouth daily with  breakfast.   glucose blood test strip Commonly known as:  ACCU-CHEK AVIVA 1 each by Other route 2 (two) times daily.   hydrOXYzine 25 MG tablet Commonly known as:  ATARAX/VISTARIL Take 25 mg by mouth at bedtime.   Ipratropium-Albuterol 20-100 MCG/ACT Aers respimat Commonly known as:  COMBIVENT Inhale 1 puff into the lungs every 6 (six) hours.   irbesartan 300 MG tablet Commonly known as:  AVAPRO Take 1 tablet (300 mg total) by mouth daily.   lactulose 10 GM/15ML solution Commonly known as:  CHRONULAC Take 30 mLs (20 g total) by mouth 2 (two) times daily as needed for mild constipation.   lamoTRIgine 100 MG tablet Commonly known as:  LAMICTAL Take 1 tablet (  100 mg total) by mouth every evening. What changed:  when to take this   latanoprost 0.005 % ophthalmic solution Commonly known as:  XALATAN Place 1 drop into both eyes at bedtime.   metFORMIN 1000 MG tablet Commonly known as:  GLUCOPHAGE Take 1 tablet (1,000 mg total) by mouth 2 (two) times daily with a meal.   methocarbamol 500 MG tablet Commonly known as:  ROBAXIN Take 1 tablet (500 mg total) by mouth 3 (three) times daily as needed for muscle spasms.   mirtazapine 7.5 MG tablet Commonly known as:  REMERON Take 1 tablet (7.5 mg total) by mouth at bedtime.   multivitamin capsule Take 1 capsule by mouth daily.   ondansetron 4 MG tablet Commonly known as:  ZOFRAN Take 1 tablet (4 mg total) by mouth every 8 (eight) hours as needed for nausea or vomiting.   OXYGEN Inhale 3 L into the lungs at bedtime.   polyethylene glycol packet Commonly known as:  MIRALAX / GLYCOLAX Take 17 g by mouth daily.   Selenium Sulf-Pyrithione-Urea 2.25 % Sham Apply 1 application topically every 14 (fourteen) days. Applied to scalp for dermatitis.   senna 8.6 MG tablet Commonly known as:  SENOKOT Take 1 tablet by mouth daily.   simvastatin 40 MG tablet Commonly known as:  ZOCOR Take 1 tablet (40 mg total) by mouth  daily. What changed:  when to take this   VITAMIN D3 PO Take 1 tablet by mouth daily.      Follow-up Information    BAITY, REGINA, NP Follow up in 1 week(s).   Specialty:  Internal Medicine Contact information: [place] [address] [phone]        Christinia Gully, MD Follow up in 4 week(s).   Specialty:  Pulmonary Disease Contact information: [address] [place] [address] [phone]            Time coordinating discharge: 35 min  Signed:  Amy Gothard Alison Stalling   [facility] 09/08/2016, 3:33 PM

## 2016-09-08 NOTE — Care Management Important Message (Signed)
Important Message  Patient Details  Name: Elizabeth Thompson MRN: [id number] Date of Birth: 05/13/1951   Medicare Important Message Given:  Yes    Camillo Flaming 09/08/2016, 3:42 [place] Message  Patient Details  Name: Elizabeth Thompson MRN: [id number] Date of Birth: 1951-11-07   Medicare Important Message Given:  Yes    Camillo Flaming 09/08/2016, 3:42 PM

## 2016-09-08 NOTE — Progress Notes (Signed)
Pt states that she will not need HHPT. Home O2 from [place].

## 2016-09-09 ENCOUNTER — Telehealth: Payer: Self-pay

## 2016-09-09 NOTE — Telephone Encounter (Signed)
TCM outreach-1st attempt. Outcome: LVMM with contact info.

## 2016-09-10 ENCOUNTER — Telehealth: Payer: Self-pay

## 2016-09-10 DIAGNOSIS — K5904 Chronic idiopathic constipation: Secondary | ICD-10-CM | POA: Diagnosis not present

## 2016-09-10 DIAGNOSIS — R1031 Right lower quadrant pain: Secondary | ICD-10-CM | POA: Diagnosis not present

## 2016-09-10 DIAGNOSIS — R140 Abdominal distension (gaseous): Secondary | ICD-10-CM | POA: Diagnosis not present

## 2016-09-10 NOTE — Telephone Encounter (Signed)
Patient advised.

## 2016-09-10 NOTE — Telephone Encounter (Signed)
I'm not sure why either, ok to resume

## 2016-09-10 NOTE — Telephone Encounter (Signed)
Transition Care Management Follow-up Telephone Call    Date discharged? 09/08/2016  How have you been since you were released from the hospital? [place].   Any patient concerns? None at this time.    Items Reviewed:  Medications reviewed: Yes  Allergies reviewed: Yes  Dietary changes reviewed: Yes  Referrals reviewed: Yes   Functional Questionnaire:  Independent - I Dependent - D    Activities of Daily Living (ADLs):    Personal hygiene - I Dressing - I Eating - I Maintaining continence - I Transferring - I   Independent Activities of Daily Living (iADLs): Basic communication skills - I Transportation - I Meal preparation  - I Shopping - I Housework - I (neice may assist as needed) Managing medications - I  Managing personal finances - I (neice may assist as needed)   Confirmed importance and date/time of follow-up visits scheduled YES  Provider Appointment booked with PCP 09/17/2016 @ 1300  Confirmed with patient if condition begins to worsen call PCP or go to the ER.  Patient was given the office number and encouraged to call back with question or concerns: YES

## 2016-09-10 NOTE — Telephone Encounter (Signed)
Per discharge summary, hospitalist discontinued Biofreeze. Patient's does not understand why and wants to resume medication. Please advised.

## 2016-09-15 ENCOUNTER — Encounter: Payer: Self-pay | Admitting: Interventional Radiology

## 2016-09-17 ENCOUNTER — Encounter: Payer: Self-pay | Admitting: Internal Medicine

## 2016-09-17 ENCOUNTER — Ambulatory Visit (INDEPENDENT_AMBULATORY_CARE_PROVIDER_SITE_OTHER): Payer: Medicare Other | Admitting: Internal Medicine

## 2016-09-17 VITALS — BP 114/76 | HR 78 | Temp 98.8°F | Wt 210.5 lb

## 2016-09-17 DIAGNOSIS — J449 Chronic obstructive pulmonary disease, unspecified: Secondary | ICD-10-CM

## 2016-09-17 DIAGNOSIS — Z23 Encounter for immunization: Secondary | ICD-10-CM

## 2016-09-17 DIAGNOSIS — J9621 Acute and chronic respiratory failure with hypoxia: Secondary | ICD-10-CM | POA: Diagnosis not present

## 2016-09-17 DIAGNOSIS — Z5189 Encounter for other specified aftercare: Secondary | ICD-10-CM

## 2016-09-17 NOTE — Patient Instructions (Signed)
Chronic Obstructive Pulmonary Disease Chronic obstructive pulmonary disease (COPD) is a common lung condition in which airflow from the lungs is limited. COPD is a general term that can be used to describe many different lung problems that limit airflow, including both chronic bronchitis and emphysema. If you have COPD, your lung function will probably never return to normal, but there are measures you can take to improve lung function and make yourself feel better. CAUSES   Smoking (common).  Exposure to secondhand smoke.  Genetic problems.  Chronic inflammatory lung diseases or recurrent infections. SYMPTOMS  Shortness of breath, especially with physical activity.  Deep, persistent (chronic) cough with a large amount of thick mucus.  Wheezing.  Rapid breaths (tachypnea).  Gray or bluish discoloration (cyanosis) of the skin, especially in your fingers, toes, or lips.  Fatigue.  Weight loss.  Frequent infections or episodes when breathing symptoms become much worse (exacerbations).  Chest tightness. DIAGNOSIS Your health care provider will take a medical history and perform a physical examination to diagnose COPD. Additional tests for COPD may include:  Lung (pulmonary) function tests.  Chest X-ray.  CT scan.  Blood tests. TREATMENT  Treatment for COPD may include:  Inhaler and nebulizer medicines. These help manage the symptoms of COPD and make your breathing more comfortable.  Supplemental oxygen. Supplemental oxygen is only helpful if you have a low oxygen level in your blood.  Exercise and physical activity. These are beneficial for nearly all people with COPD.  Lung surgery or transplant.  Nutrition therapy to gain weight, if you are underweight.  Pulmonary rehabilitation. This may involve working with a team of health care providers and specialists, such as respiratory, occupational, and physical therapists. HOME CARE INSTRUCTIONS  Take all medicines  (inhaled or pills) as directed by your health care provider.  Avoid over-the-counter medicines or cough syrups that dry up your airway (such as antihistamines) and slow down the elimination of secretions unless instructed otherwise by your health care provider.  If you are a smoker, the most important thing that you can do is stop smoking. Continuing to smoke will cause further lung damage and breathing trouble. Ask your health care provider for help with quitting smoking. He or she can direct you to community resources or hospitals that provide support.  Avoid exposure to irritants such as smoke, chemicals, and fumes that aggravate your breathing.  Use oxygen therapy and pulmonary rehabilitation if directed by your health care provider. If you require home oxygen therapy, ask your health care provider whether you should purchase a pulse oximeter to measure your oxygen level at home.  Avoid contact with individuals who have a contagious illness.  Avoid extreme temperature and humidity changes.  Eat healthy foods. Eating smaller, more frequent meals and resting before meals may help you maintain your strength.  Stay active, but balance activity with periods of rest. Exercise and physical activity will help you maintain your ability to do things you want to do.  Preventing infection and hospitalization is very important when you have COPD. Make sure to receive all the vaccines your health care provider recommends, especially the pneumococcal and influenza vaccines. Ask your health care provider whether you need a pneumonia vaccine.  Learn and use relaxation techniques to manage stress.  Learn and use controlled breathing techniques as directed by your health care provider. Controlled breathing techniques include:  Pursed lip breathing. Start by breathing in (inhaling) through your nose for 1 second. Then, purse your lips as if you were  going to whistle and breathe out (exhale) through the  pursed lips for 2 seconds.  Diaphragmatic breathing. Start by putting one hand on your abdomen just above your waist. Inhale slowly through your nose. The hand on your abdomen should move out. Then purse your lips and exhale slowly. You should be able to feel the hand on your abdomen moving in as you exhale.  Learn and use controlled coughing to clear mucus from your lungs. Controlled coughing is a series of short, progressive coughs. The steps of controlled coughing are: 1. Lean your head slightly forward. 2. Breathe in deeply using diaphragmatic breathing. 3. Try to hold your breath for 3 seconds. 4. Keep your mouth slightly open while coughing twice. 5. Spit any mucus out into a tissue. 6. Rest and repeat the steps once or twice as needed. SEEK MEDICAL CARE IF:  You are coughing up more mucus than usual.  There is a change in the color or thickness of your mucus.  Your breathing is more labored than usual.  Your breathing is faster than usual. SEEK IMMEDIATE MEDICAL CARE IF:  You have shortness of breath while you are resting.  You have shortness of breath that prevents you from:  Being able to talk.  Performing your usual physical activities.  You have chest pain lasting longer than 5 minutes.  Your skin color is more cyanotic than usual.  You measure low oxygen saturations for longer than 5 minutes with a pulse oximeter. MAKE SURE YOU:  Understand these instructions.  Will watch your condition.  Will get help right away if you are not doing well or get worse.   This information is not intended to replace advice given to you by your health care provider. Make sure you discuss any questions you have with your health care provider.   Document Released: 09/09/2005 Document Revised: 12/21/2014 Document Reviewed: 07/27/2013 Elsevier Interactive Patient Education [employer].

## 2016-09-17 NOTE — Progress Notes (Signed)
Subjective:    Patient ID: Elizabeth Thompson, female    DOB: 05/08/1951, 65 y.o.   MRN: [id number]  HPI  Pt presents to the clinic today for TCM hospital follow up for acute on chronic respiratory failure with hypoxemia. She was seen by Rexene Edison, NP on 9/22, for c/o SOB. She was concerned about possible PE given recent back surgery and immobility. She was sent from the office to the ER for further evaluation. Chest xray and VQ scan were normal. Echo showed grade 2 diastolic dysfunction with EF > 55%. She was diagnosed with progression of COPD and initiated on neb treatments and maintained on oxygen to keep sats > 90%. She was discharged home after a 4 day admission. Since discharge, she has been feeling fatigued. She is on 2L continuous O2. She has SOB with exertion but denies cough. She reports her appetite is not great. Her bowels and bladder are moving normally. She is taking Symbicort and Combivent as prescribed. She reports she has a follow up appt with Dr. Melvyn Novas in November 2017.  Review of Systems      Past Medical History:  Diagnosis Date  . Arthritis   . Back pain   . Bell's palsy   . Bipolar disorder ([name])   . Bronchitis   . Chronic low back pain 05/09/2015  . COPD (chronic obstructive pulmonary disease) ([name])    hyoxia during sleep- using O2 as needed  . Cough with expectoration 05/21/16   recent diagnosis of bronchitis  . Cyst of right kidney   . Diabetes mellitus without complication ([place])    type 2  . Frequency of urination   . GERD (gastroesophageal reflux disease)   . Glaucoma   . History of blood transfusion   . History of colon polyps   . History of hiatal hernia   . History of tobacco use   . Hypercalcemia   . Hyperlipidemia   . Hypertension   . Memory disorder 09/05/2014  . On home oxygen therapy    3L/M [name] at night   . Schizo-affective psychosis ([name])   . Shortness of breath dyspnea    exertion   . Uterine fibroid     Current Outpatient  Prescriptions  Medication Sig Dispense Refill  . albuterol (PROVENTIL HFA;VENTOLIN HFA) 108 (90 BASE) MCG/ACT inhaler Inhale 2 puffs into the lungs every 6 (six) hours as needed for wheezing or shortness of breath.    [name] albuterol (PROVENTIL) (2.5 MG/3ML) 0.083% nebulizer solution Take 2.5 mg by nebulization every 6 (six) hours as needed for wheezing or shortness of breath.    . ARIPiprazole (ABILIFY) 20 MG tablet Take 1 tablet (20 mg total) by mouth at bedtime. 30 tablet 0  . benzonatate (TESSALON) 100 MG capsule Take 1 capsule (100 mg total) by mouth 2 (two) times daily as needed for cough. 30 capsule 0  . bisacodyl (DULCOLAX) 10 MG suppository Place 1 suppository (10 mg total) rectally daily as needed for moderate constipation. 12 suppository 0  . budesonide-formoterol (SYMBICORT) 160-4.5 MCG/ACT inhaler Inhale 2 puffs into the lungs 2 (two) times daily.    . Cholecalciferol (VITAMIN D3 PO) Take 1 tablet by mouth daily.    [name] donepezil (ARICEPT) 5 MG tablet Take 1 tablet (5 mg total) by mouth at bedtime. 30 tablet 0  . dorzolamide-timolol (COSOPT) 22.3-6.8 MG/ML ophthalmic solution Place 1 drop into both eyes 2 (two) times daily.    [name] esomeprazole (NEXIUM) 20 MG capsule Take 20 mg  by mouth daily.     [name] glimepiride (AMARYL) 4 MG tablet Take 1 tablet (4 mg total) by mouth daily with breakfast.    . glucose blood (ACCU-CHEK AVIVA) test strip 1 each by Other route 2 (two) times daily. 200 each 5  . hydrOXYzine (ATARAX/VISTARIL) 25 MG tablet Take 25 mg by mouth at bedtime.     . Ipratropium-Albuterol (COMBIVENT) 20-100 MCG/ACT AERS respimat Inhale 1 puff into the lungs every 6 (six) hours.    . irbesartan (AVAPRO) 300 MG tablet Take 1 tablet (300 mg total) by mouth daily. 30 tablet 30  . lactulose (CHRONULAC) 10 GM/15ML solution Take 30 mLs (20 g total) by mouth 2 (two) times daily as needed for mild constipation. 240 mL 0  . lamoTRIgine (LAMICTAL) 100 MG tablet Take 1 tablet (100 mg total) by mouth  every evening. (Patient taking differently: Take 100 mg by mouth daily after supper. ) 30 tablet 0  . latanoprost (XALATAN) 0.005 % ophthalmic solution Place 1 drop into both eyes at bedtime. 2.5 mL 12  . metFORMIN (GLUCOPHAGE) 1000 MG tablet Take 1 tablet (1,000 mg total) by mouth 2 (two) times daily with a meal.    . methocarbamol (ROBAXIN) 500 MG tablet Take 1 tablet (500 mg total) by mouth 3 (three) times daily as needed for muscle spasms. 60 tablet 0  . mirtazapine (REMERON) 7.5 MG tablet Take 1 tablet (7.5 mg total) by mouth at bedtime. 30 tablet 0  . Multiple Vitamin (MULTIVITAMIN) capsule Take 1 capsule by mouth daily.    . ondansetron (ZOFRAN) 4 MG tablet Take 1 tablet (4 mg total) by mouth every 8 (eight) hours as needed for nausea or vomiting. 20 tablet 0  . OXYGEN Inhale 3 L into the lungs at bedtime.     . polyethylene glycol (MIRALAX / GLYCOLAX) packet Take 17 g by mouth daily. 14 each 0  . Selenium Sulf-Pyrithione-Urea 2.25 % SHAM Apply 1 application topically every 14 (fourteen) days. Applied to scalp for dermatitis.  3  . senna (SENOKOT) 8.6 MG tablet Take 1 tablet by mouth daily.    . simvastatin (ZOCOR) 40 MG tablet Take 1 tablet (40 mg total) by mouth daily. (Patient taking differently: Take 40 mg by mouth daily after supper. ) 30 tablet    No current facility-administered medications for this visit.     Allergies  Allergen Reactions  . Codeine Nausea And Vomiting  . Prednisone Other (See Comments)    Has to monitor because she is a diabetic     Family History  Problem Relation Age of Onset  . Dementia Mother   . Alzheimer's disease Mother   . Heart disease Mother   . Hypertension Mother   . Diabetes Mother   . Diabetes Father   . Alzheimer's disease Sister   . Heart disease Brother   . Heart attack Brother   . Bladder Cancer Neg Hx   . Kidney cancer Neg Hx   . Prostate cancer Neg Hx     Social History   Social History  . Marital status: Divorced     Spouse name: N/A  . Number of children: 1  . Years of education: college 4   Occupational History  . disabled    Social History Main Topics  . Smoking status: Former Smoker    Packs/day: 1.00    Years: 28.00    Types: Cigarettes    Quit date: 07/15/2015  . Smokeless tobacco: Never Used  . Alcohol  use No  . Drug use: No  . Sexual activity: No   Other Topics Concern  . Not on file   Social History Narrative   Patient is right handed.   Patient drinks 2 cups caffeine daily.     Constitutional: Pt reports fatigue. Denies fever, malaise, headache or abrupt weight changes.  HEENT: Denies eye pain, eye redness, ear pain, ringing in the ears, wax buildup, runny nose, nasal congestion, bloody nose, or sore throat. Respiratory: Pt reports shortness of breath. Denies difficulty breathing, cough or sputum production.   Cardiovascular: Denies chest pain, chest tightness, palpitations or swelling in the hands or feet.  Gastrointestinal: Denies abdominal pain, bloating, constipation, diarrhea or blood in the stool.  Neurological: Pt has difficulty with memory. Denies dizziness, difficulty with speech or problems with balance and coordination.  Psych: Pt has history of depression. Denies anxiety, SI/HI.  No other specific complaints in a complete review of systems (except as listed in HPI above).  Objective:   Physical Exam   BP 114/76   Pulse 78   Temp 98.8 F (37.1 C) (Oral)   Wt 210 lb 8 oz (95.5 kg)   SpO2 94%   BMI 37.29 kg/m  Wt Readings from Last 3 Encounters:  09/17/16 210 lb 8 oz (95.5 kg)  09/04/16 208 lb 15.9 oz (94.8 kg)  09/04/16 213 lb (96.6 kg)    General: Appears her stated age, chronically ill appearing, in NAD. Neck:  Neck supple, trachea midline. No masses, lumps or thyromegaly present.  Cardiovascular: Normal rate and rhythm. S1,S2 noted.  No murmur, rubs or gallops noted.  Pulmonary/Chest: Normal effort and positive vesicular breath sounds. No respiratory  distress. No wheezes, rales or ronchi noted.  Abdomen: Soft and nontender. Musculoskeletal:  No difficulty with gait.  Neurological: Alert and oriented.  Psychiatric: Mood and affect flat.  BMET    Component Value Date/Time   NA 141 09/06/2016 0530   NA 142 09/05/2014 1459   K 4.9 09/06/2016 0530   CL 114 (H) 09/06/2016 0530   CO2 20 (L) 09/06/2016 0530   GLUCOSE 106 (H) 09/06/2016 0530   BUN 24 (H) 09/06/2016 0530   BUN 9 09/05/2014 1459   CREATININE 1.53 (H) 09/06/2016 0530   CALCIUM 9.5 09/06/2016 0530   GFRNONAA 34 (L) 09/06/2016 0530   GFRAA 40 (L) 09/06/2016 0530    Lipid Panel     Component Value Date/Time   CHOL 245 (H) 03/19/2016 1129   TRIG 234.0 (H) 03/19/2016 1129   HDL 61.60 03/19/2016 1129   CHOLHDL 4 03/19/2016 1129   VLDL 46.8 (H) 03/19/2016 1129    CBC    Component Value Date/Time   WBC 6.0 09/06/2016 0530   RBC 3.81 (L) 09/06/2016 0530   HGB 10.5 (L) 09/06/2016 0530   HCT 32.2 (L) 09/06/2016 0530   PLT 323 09/06/2016 0530   MCV 84.5 09/06/2016 0530   MCH 27.6 09/06/2016 0530   MCHC 32.6 09/06/2016 0530   RDW 14.4 09/06/2016 0530   LYMPHSABS 3.0 05/21/2016 1205   MONOABS 0.6 05/21/2016 1205   EOSABS 0.4 05/21/2016 1205   BASOSABS 0.0 05/21/2016 1205    Hgb A1C Lab Results  Component Value Date   HGBA1C 7.3 (H) 06/15/2016        Assessment & Plan:   TCM hospital follow up for acute on chronic hypoxic respiratory failure secondary to COPD:  Hospital notes, labs and imaging reviewed She seems to be breathing normal today Continue Symbicort  and Combivent today Continue oxygen Flu and prevnar given today She will call to make a follow up appt with Dr. Melvyn Novas  RTC in 3 months for Medicare Wellness Exam Webb Silversmith, NP

## 2016-09-18 ENCOUNTER — Other Ambulatory Visit: Payer: Self-pay | Admitting: Internal Medicine

## 2016-09-18 DIAGNOSIS — R918 Other nonspecific abnormal finding of lung field: Secondary | ICD-10-CM

## 2016-09-24 ENCOUNTER — Other Ambulatory Visit: Payer: Self-pay | Admitting: Interventional Radiology

## 2016-09-24 DIAGNOSIS — C642 Malignant neoplasm of left kidney, except renal pelvis: Secondary | ICD-10-CM

## 2016-09-30 NOTE — Addendum Note (Signed)
Addended by: Lurlean Nanny on: 09/30/2016 11:26 AM   Modules accepted: Orders

## 2016-10-01 ENCOUNTER — Encounter (HOSPITAL_COMMUNITY): Payer: Self-pay

## 2016-10-01 ENCOUNTER — Ambulatory Visit
Admission: RE | Admit: 2016-10-01 | Discharge: 2016-10-01 | Disposition: A | Discharge status: Home / Self Care | Payer: Medicare Other | Source: Ambulatory Visit | Attending: Interventional Radiology | Admitting: Interventional Radiology

## 2016-10-01 ENCOUNTER — Ambulatory Visit (HOSPITAL_COMMUNITY)
Admission: RE | Admit: 2016-10-01 | Discharge: 2016-10-01 | Disposition: A | Discharge status: Home / Self Care | Payer: Medicare Other | Source: Ambulatory Visit | Attending: Interventional Radiology | Admitting: Interventional Radiology

## 2016-10-01 DIAGNOSIS — K8020 Calculus of gallbladder without cholecystitis without obstruction: Secondary | ICD-10-CM | POA: Diagnosis not present

## 2016-10-01 DIAGNOSIS — I700 Atherosclerosis of aorta: Secondary | ICD-10-CM | POA: Diagnosis not present

## 2016-10-01 DIAGNOSIS — C642 Malignant neoplasm of left kidney, except renal pelvis: Secondary | ICD-10-CM | POA: Diagnosis not present

## 2016-10-01 DIAGNOSIS — K8080 Other cholelithiasis without obstruction: Secondary | ICD-10-CM | POA: Diagnosis not present

## 2016-10-01 HISTORY — PX: IR GENERIC HISTORICAL: IMG1180011

## 2016-10-01 MED ORDER — IOPAMIDOL (ISOVUE-300) INJECTION 61%
100.0000 mL | Freq: Once | INTRAVENOUS | Status: AC | PRN
  Administered 2016-10-01: 80 mL via INTRAVENOUS

## 2016-10-01 NOTE — Progress Notes (Signed)
Chief Complaint: Left RCC, SP cryoablation  Referring Physician(s): Dr. Hollice Espy, [facility] Dr. Zola Button, Oncology  History of Present Illness: Elizabeth Thompson is a 65 y.o. female presenting today to vascular and interventional radiology clinic as a scheduled follow-up appointment, status post cryoablation of a left superior pole renal cell carcinoma.  She is 3 months status post CT-guided ablation, which was performed 06/26/2016. She had a same day biopsy performed which demonstrated:  Kidney, biopsy, left CLEAR CELL RENAL CELL CARCINOMA, WHO NUCLEAR GRADE 1 WITH PREDOMINANT HYALINIZED STROMA  She continues to do well from the standpoint of her ablation recovery, denies any symptoms of flank pain, inguinal pain, GI upset, or urinary symptoms such as hematuria. Her main complaint is regarding her known COPD and shortness of breath, for which she is prescribed home oxygen. Her goal is to return to the [facility] and participate with water-based exercise/activities.  Her first follow-up contrast enhanced CT scan was performed today. I reviewed with her the preliminary results, which show expected ablation changers at the treatment site of the superior pole tumor, typical of post treatment effect.  After she left our clinic, I was able to review the CT images with one of my partners in diagnostic radiology, body-imaging expert Dr. Vinnie Langton.  Although the ablation site looks good, there is new recognition of a small enhancing lesion at the anterior aspect of the left kidney measuring 1.2 cm, concerning for a new renal cell carcinoma.    Past Medical History:  Diagnosis Date  . Arthritis   . Back pain   . Bell's palsy   . Bipolar disorder ([place])   . Bronchitis   . Chronic low back pain 05/09/2015  . COPD (chronic obstructive pulmonary disease) ([place])    hyoxia during sleep- using O2 as needed  . Cough with expectoration 05/21/16   recent diagnosis  of bronchitis  . Cyst of right kidney   . Diabetes mellitus without complication ([place])    type 2  . Frequency of urination   . GERD (gastroesophageal reflux disease)   . Glaucoma   . History of blood transfusion   . History of colon polyps   . History of hiatal hernia   . History of tobacco use   . Hypercalcemia   . Hyperlipidemia   . Hypertension   . Memory disorder 09/05/2014  . On home oxygen therapy    3L/M [place] at night   . Schizo-affective psychosis ([place])   . Shortness of breath dyspnea    exertion   . Uterine fibroid     Past Surgical History:  Procedure Laterality Date  . ABDOMINAL HYSTERECTOMY    . APPENDECTOMY    . COLONOSCOPY W/ POLYPECTOMY    . IR GENERIC HISTORICAL  04/29/2016   IR RADIOLOGIST EVAL & MGMT 04/29/2016 Corrie Mckusick, DO [facility] INTERV RAD  . LUMBAR LAMINECTOMY/DECOMPRESSION MICRODISCECTOMY N/A 08/14/2015   Procedure: LUMBAR DECOMPRESSION MICRODISCECTOMY L3-S1;  Surgeon: Melina Schools, MD;  Location: [place];  Service: Orthopedics;  Laterality: N/A;  . TONSILLECTOMY AND ADENOIDECTOMY      Allergies: Codeine and Prednisone  Medications: Prior to Admission medications   Medication Sig Start Date End Date Taking? Authorizing Provider  albuterol (PROVENTIL HFA;VENTOLIN HFA) 108 (90 BASE) MCG/ACT inhaler Inhale 2 puffs into the lungs every 6 (six) hours as needed for wheezing or shortness of breath. 08/27/14  Yes Kerrie Buffalo, NP  albuterol (PROVENTIL) (2.5 MG/3ML) 0.083% nebulizer solution Take 2.5 mg by nebulization  every 6 (six) hours as needed for wheezing or shortness of breath.   Yes Historical Provider, MD  ARIPiprazole (ABILIFY) 20 MG tablet Take 1 tablet (20 mg total) by mouth at bedtime. 08/27/14  Yes Kerrie Buffalo, NP  bisacodyl (DULCOLAX) 10 MG suppository Place 1 suppository (10 mg total) rectally daily as needed for moderate constipation. 09/08/16  Yes Geradine Girt, DO  budesonide-formoterol (SYMBICORT) 160-4.5 MCG/ACT inhaler Inhale 2 puffs  into the lungs 2 (two) times daily.   Yes Historical Provider, MD  Cholecalciferol (VITAMIN D3 PO) Take 1 tablet by mouth daily.   Yes Historical Provider, MD  donepezil (ARICEPT) 5 MG tablet Take 1 tablet (5 mg total) by mouth at bedtime. 08/27/14  Yes Kerrie Buffalo, NP  dorzolamide-timolol (COSOPT) 22.3-6.8 MG/ML ophthalmic solution Place 1 drop into both eyes 2 (two) times daily.   Yes Historical Provider, MD  esomeprazole (NEXIUM) 20 MG capsule Take 20 mg by mouth daily.    Yes Historical Provider, MD  glimepiride (AMARYL) 4 MG tablet Take 1 tablet (4 mg total) by mouth daily with breakfast. 08/27/14  Yes Kerrie Buffalo, NP  glucose blood (ACCU-CHEK AVIVA) test strip 1 each by Other route 2 (two) times daily. 08/04/16  Yes Jearld Fenton, NP  hydrOXYzine (ATARAX/VISTARIL) 25 MG tablet Take 25 mg by mouth at bedtime.  04/17/16  Yes Historical Provider, MD  Ipratropium-Albuterol (COMBIVENT) 20-100 MCG/ACT AERS respimat Inhale 1 puff into the lungs every 6 (six) hours.   Yes Historical Provider, MD  irbesartan (AVAPRO) 300 MG tablet Take 1 tablet (300 mg total) by mouth daily. 12/05/15  Yes Tanda Rockers, MD  latanoprost (XALATAN) 0.005 % ophthalmic solution Place 1 drop into both eyes at bedtime. 08/27/14  Yes Kerrie Buffalo, NP  metFORMIN (GLUCOPHAGE) 1000 MG tablet Take 1 tablet (1,000 mg total) by mouth 2 (two) times daily with a meal. 08/27/14  Yes Kerrie Buffalo, NP  methocarbamol (ROBAXIN) 500 MG tablet Take 1 tablet (500 mg total) by mouth 3 (three) times daily as needed for muscle spasms. 08/14/15  Yes Melina Schools, MD  mirtazapine (REMERON) 7.5 MG tablet Take 1 tablet (7.5 mg total) by mouth at bedtime. 08/27/14  Yes Kerrie Buffalo, NP  Multiple Vitamin (MULTIVITAMIN) capsule Take 1 capsule by mouth daily.   Yes Historical Provider, MD  ondansetron (ZOFRAN) 4 MG tablet Take 1 tablet (4 mg total) by mouth every 8 (eight) hours as needed for nausea or vomiting. 08/14/15  Yes Melina Schools, MD    OXYGEN Inhale 3 L into the lungs at bedtime.    Yes Historical Provider, MD  polyethylene glycol (MIRALAX / GLYCOLAX) packet Take 17 g by mouth daily. 09/08/16  Yes Geradine Girt, DO  Selenium Sulf-Pyrithione-Urea 2.25 % SHAM Apply 1 application topically every 14 (fourteen) days. Applied to scalp for dermatitis. 01/25/15  Yes Historical Provider, MD  senna (SENOKOT) 8.6 MG tablet Take 1 tablet by mouth daily.   Yes Historical Provider, MD  simvastatin (ZOCOR) 40 MG tablet Take 1 tablet (40 mg total) by mouth daily. Patient taking differently: Take 40 mg by mouth daily after supper.  08/27/14  Yes Kerrie Buffalo, NP  benzonatate (TESSALON) 100 MG capsule Take 1 capsule (100 mg total) by mouth 2 (two) times daily as needed for cough. Patient not taking: Reported on 10/01/2016 06/01/16   Jearld Fenton, NP  lactulose (CHRONULAC) 10 GM/15ML solution Take 30 mLs (20 g total) by mouth 2 (two) times daily as needed for mild constipation.  Patient not taking: Reported on 10/01/2016 09/08/16   Geradine Girt, DO  lamoTRIgine (LAMICTAL) 100 MG tablet Take 1 tablet (100 mg total) by mouth every evening. Patient taking differently: Take 100 mg by mouth daily after supper.  08/27/14   Kerrie Buffalo, NP     Family History  Problem Relation Age of Onset  . Dementia Mother   . Alzheimer's disease Mother   . Heart disease Mother   . Hypertension Mother   . Diabetes Mother   . Diabetes Father   . Alzheimer's disease Sister   . Heart disease Brother   . Heart attack Brother   . Bladder Cancer Neg Hx   . Kidney cancer Neg Hx   . Prostate cancer Neg Hx     Social History   Social History  . Marital status: Divorced    Spouse name: N/A  . Number of children: 1  . Years of education: college 4   Occupational History  . disabled    Social History Main Topics  . Smoking status: Former Smoker    Packs/day: 1.00    Years: 28.00    Types: Cigarettes    Quit date: 07/15/2015  . Smokeless tobacco: Never  Used  . Alcohol use No  . Drug use: No  . Sexual activity: No   Other Topics Concern  . Not on file   Social History Narrative   Patient is right handed.   Patient drinks 2 cups caffeine daily.    ECOG Status: 1 - Symptomatic but completely ambulatory  Review of Systems: A 12 point ROS discussed and pertinent positives are indicated in the HPI above.  All other systems are negative.  Review of Systems  Vital Signs: BP 116/67 (BP Location: Left Arm, Patient Position: Sitting, Cuff Size: Normal)   Pulse 97   Temp 97.4 F (36.3 C) (Oral)   Resp 15   SpO2 (P) 93%   Physical Exam  Mallampati Score:     Imaging: Dg Chest 2 View  Result Date: 09/04/2016 CLINICAL DATA:  SOB x 2 days; hx COPD, bronchitis; diabetic; HTN; ex smoker EXAM: CHEST - 2 VIEW COMPARISON:  05/21/2016 FINDINGS: Lungs clear. Heart size upper limits normal.  Mildly tortuous thoracic aorta. No effusion.  No pneumothorax. Visualized bones unremarkable. IMPRESSION: No acute cardiopulmonary disease. Electronically Signed   By: Lucrezia Europe M.D.   On: 09/04/2016 14:09   Nm Pulmonary Perf And Vent  Result Date: 09/04/2016 CLINICAL DATA:  One month history of shortness of breath worsening over the past week. EXAM: NUCLEAR MEDICINE VENTILATION - PERFUSION LUNG SCAN TECHNIQUE: Ventilation images were obtained in multiple projections using inhaled aerosol Tc-44mDTPA. Perfusion images were obtained in multiple projections after intravenous injection of Tc-959mAA. RADIOPHARMACEUTICALS:  32.7 mCi Technetium-9930mPA aerosol inhalation and 4.4 mCi Technetium-83m90m IV COMPARISON:  Chest x-ray 09/04/2016 FINDINGS: Ventilation: Heterogeneous ventilation study without focal segmental defect. Mild central deposition of the radiopharmaceutical. Perfusion: Patchy perfusion study but no segmental or subsegmental defects to suggest pulmonary embolism. IMPRESSION: Negative study for pulmonary embolism. Electronically Signed   By: P.  Marijo Sanes.   On: 09/04/2016 17:42   Ct Abdomen W Wo Contrast  Result Date: 10/01/2016 CLINICAL DATA:  65 y87r old female with history of left-sided renal cancer diagnosed in July 2017 status post left renal ablation. Chronic diarrhea. EXAM: CT ABDOMEN WITHOUT AND WITH CONTRAST TECHNIQUE: Multidetector CT imaging of the abdomen was performed following the standard protocol before and following the bolus administration  of intravenous contrast. CONTRAST:  74m ISOVUE-300 IOPAMIDOL (ISOVUE-300) INJECTION 61% COMPARISON:  No prior abdominal CT examination. Abdominal MRI 04/10/2016. FINDINGS: Lower chest: Areas of cylindrical bronchiectasis and mild scarring are noted in the lung bases bilaterally. Atherosclerotic calcifications in the right coronary artery. Hepatobiliary: Sub cm low-attenuation lesion in segment 2 of the liver, too small to characterize, but statistically likely a tiny cyst. No other suspicious hepatic lesions are noted. No intra or extrahepatic biliary ductal dilatation. Multiple calcified gallstones lying dependently in the gallbladder, without findings to suggest an acute cholecystitis at this time. Pancreas: No pancreatic mass. No pancreatic ductal dilatation. No pancreatic or peripancreatic fluid or inflammatory changes. Spleen: Unremarkable. Adrenals/Urinary Tract: In the area of concern in the upper pole of the left kidney there is now a relatively well-defined 2.9 cm intermediate attenuation lesion which does not enhance, surrounded by some architectural distortion in the in the adjacent retroperitoneal fat, compatible with a typical appearance of a post ablation defect. No definite enhancing soft tissue mass in this region to suggest residual/recurrent disease. Scattered throughout the kidneys bilaterally are numerous other low-attenuation lesions, compatible with simple cysts, the largest of which measures up to 3.1 cm in the posterior aspect of the interpolar region of the right  kidney. In addition, there are multiple all intermediate to high attenuation lesions which do not enhance, compatible with proteinaceous/hemorrhagic cysts in the kidneys bilaterally. Importantly, however, there is one lesion of concern in the anterior aspect of the upper pole of the left kidney, best appreciated on axial image 38 of series 6 and axial image 34 of series 2 where the lesion which overall measures approximately 13 mm, and has a small nodular component that clearly enhances increasing from 22 HU on precontrast images to 99 HU on post-contrast images, highly concerning for a second renal cell carcinoma. No hydroureteronephrosis in the visualized portions of the abdomen. Bilateral adrenal glands are normal in appearance. Stomach/Bowel: Normal appearance of the stomach. There is no pathologic dilatation of the visualized portions of small bowel or colon. Vascular/Lymphatic: Aortic atherosclerosis, without evidence of aneurysm or dissection in the abdominal vasculature. No lymphadenopathy is noted in the abdomen. Other: No significant volume of ascites and no pneumoperitoneum in the visualized portions of the peritoneal cavity. Musculoskeletal: There are no aggressive appearing lytic or blastic lesions noted in the visualized portions of the skeleton. IMPRESSION: 1. The post ablation site in the upper pole of the left kidney has a typical appearance, without evidence to suggest residual or locally recurrent disease. 2. Importantly, however, in the anterior aspect of the upper pole of the left kidney, separate from the regionally ablated lesion, there is a 13 mm lesion which has enhancing mural nodularity, highly concerning for a second renal cell carcinoma. 3. Multiple other lesions are noted in the kidneys bilaterally, which appear to represent a combination of simple cysts and proteinaceous/hemorrhagic cysts, as discussed above. 4. Aortic atherosclerosis. 5. Cholelithiasis without evidence of acute  cholecystitis at this time. 6. Additional incidental findings, as above. These results were called by telephone at the time of interpretation on 10/01/2016 at 4:41 pm to Dr. JCorrie Mckusick who verbally acknowledged these results. Electronically Signed   By: DVinnie LangtonM.D.   On: 10/01/2016 16:44   Dg Abd Portable 1v  Result Date: 09/07/2016 CLINICAL DATA:  Sudden onset nausea and lower abdomen pain since 2:30 p.m. today. EXAM: PORTABLE ABDOMEN - 1 VIEW COMPARISON:  None. FINDINGS: The bowel gas pattern is normal. Marked bowel content  is identified throughout colon. There is no bowel obstruction. There is a 1.6 cm calcific density just inferior to the right sacroiliac joint in the pelvis. IMPRESSION: Constipation. 1.6 cm calcific density just inferior to the right sacroiliac joint. The origin of this calcific density is unclear based on this single frontal abdomen x-ray. Electronically Signed   By: Abelardo Diesel M.D.   On: 09/07/2016 17:45    Labs:  CBC:  Recent Labs  05/21/16 1205 06/23/16 1215 09/04/16 1241 09/06/16 0530  WBC 9.3 6.8 7.4 6.0  HGB 11.7* 12.2 11.9* 10.5*  HCT 35.6* 37.9 37.2 32.2*  PLT 343 341 358 323    COAGS:  Recent Labs  05/21/16 1205 06/23/16 1215  INR 0.92 0.98  APTT 37 35    BMP:  Recent Labs  06/26/16 1005 09/04/16 1241 09/05/16 1030 09/06/16 0530  NA 138 140 140 141  K 4.7 5.3* 5.1 4.9  CL 108 111 110 114*  CO2 22 21* 24 20*  GLUCOSE 126* 114* 156* 106*  BUN 23* 33* 25* 24*  CALCIUM 10.0 10.4* 9.8 9.5  CREATININE 1.61* 1.63* 1.57* 1.53*  GFRNONAA 33* 32* 33* 34*  GFRAA 38* 37* 39* 40*    LIVER FUNCTION TESTS:  Recent Labs  11/07/15 0230 11/08/15 0357 03/19/16 1129 09/05/16 1030  BILITOT 0.5 0.6 0.3 0.6  AST _0 ALT 15 13* 12 12*  ALKPHOS 59 55 75 60  PROT 7.1 6.9 7.7 7.7  ALBUMIN 3.6 3.7 4.4 3.8    TUMOR MARKERS: No results for input(s): AFPTM, CEA, CA199, CHROMGRNA in the last 8760 hours.  Assessment and  Plan:  Elizabeth Thompson is a 65 year old female with a history of CT-guided ablation of left superior renal cell carcinoma, T1a, status post cryoablation 06/26/2016.  From the standpoint of her recovery, she has done quite well. Her only complaints at this time are related to her known COPD.  During her appointment, I was able to review preliminary results of her CT scan, demonstrating typical posttreatment effects, of which I have no concern. After the appointment I was able to review the CT images with my diagnostic imaging partner, body imaging expert Dr. Vinnie Langton.   We agree there is a newly recognized small enhancing lesion on the anterior cortex of the left kidney which is suspicious for a small renal cell carcinoma. The size of this lesion is 1.2 cm. We will review this result with her referring oncology physician and urologist. In addition, we will share this result with Elizabeth. [place].  Regarding a scheduled follow-up, repeat cross-sectional imaging with contrast is typical in another 3 months, a total of 6 months after the treatment. The three-month appointment may be moved up given the new finding on the CT scan. Once we discuss her potential treatment with her referring physicians, we will decide on the data for future appointment. Tentatively, I will have her scheduled for a new office appointment in late November.  1- We will tentatively schedule new office appointment in late November with Dr. Earleen Newport. 2- Repeat contrast-enhanced CT will be tentatively scheduled in 3 months, January 2018.    Electronically Signed: Corrie Mckusick 10/01/2016, 5:11 PM   I spent a total of    25 Minutes in face to face in clinical consultation, greater than 50% of which was counseling/coordinating care for left renal cell carcinoma, status post cryoablation.

## 2016-10-02 DIAGNOSIS — J449 Chronic obstructive pulmonary disease, unspecified: Secondary | ICD-10-CM | POA: Diagnosis not present

## 2016-10-02 DIAGNOSIS — J9621 Acute and chronic respiratory failure with hypoxia: Secondary | ICD-10-CM | POA: Diagnosis not present

## 2016-10-13 ENCOUNTER — Other Ambulatory Visit: Payer: Self-pay | Admitting: Interventional Radiology

## 2016-10-13 DIAGNOSIS — C642 Malignant neoplasm of left kidney, except renal pelvis: Secondary | ICD-10-CM

## 2016-10-21 ENCOUNTER — Telehealth: Payer: Self-pay | Admitting: Urology

## 2016-10-21 NOTE — Telephone Encounter (Signed)
What kind of scan is she supposed to have prior to her follow up appt?  Elizabeth Thompson

## 2016-11-02 DIAGNOSIS — J449 Chronic obstructive pulmonary disease, unspecified: Secondary | ICD-10-CM | POA: Diagnosis not present

## 2016-11-02 DIAGNOSIS — J9621 Acute and chronic respiratory failure with hypoxia: Secondary | ICD-10-CM | POA: Diagnosis not present

## 2016-11-03 ENCOUNTER — Encounter: Payer: Self-pay | Admitting: Interventional Radiology

## 2016-11-08 DIAGNOSIS — J449 Chronic obstructive pulmonary disease, unspecified: Secondary | ICD-10-CM | POA: Diagnosis not present

## 2016-11-08 DIAGNOSIS — J9621 Acute and chronic respiratory failure with hypoxia: Secondary | ICD-10-CM | POA: Diagnosis not present

## 2016-11-10 ENCOUNTER — Ambulatory Visit (INDEPENDENT_AMBULATORY_CARE_PROVIDER_SITE_OTHER)
Admission: RE | Admit: 2016-11-10 | Discharge: 2016-11-10 | Disposition: A | Discharge status: Home / Self Care | Payer: Medicare Other | Source: Ambulatory Visit | Attending: Internal Medicine | Admitting: Internal Medicine

## 2016-11-10 DIAGNOSIS — R918 Other nonspecific abnormal finding of lung field: Secondary | ICD-10-CM | POA: Diagnosis not present

## 2016-11-11 ENCOUNTER — Ambulatory Visit
Admission: RE | Admit: 2016-11-11 | Discharge: 2016-11-11 | Disposition: A | Discharge status: Home / Self Care | Payer: Medicare Other | Source: Ambulatory Visit | Attending: Interventional Radiology | Admitting: Interventional Radiology

## 2016-11-11 DIAGNOSIS — C642 Malignant neoplasm of left kidney, except renal pelvis: Secondary | ICD-10-CM

## 2016-11-11 HISTORY — PX: IR GENERIC HISTORICAL: IMG1180011

## 2016-11-11 NOTE — Progress Notes (Signed)
Chief Complaint: I'm here to talk about my results after treatment.   Referring Physician(s): Dr Hollice Espy, [facility] Dr Zola Button, Oncology  History of Present Illness: Elizabeth Thompson is a 65 y.o. female presenting today to vascular and interventional radiology clinic as a scheduled follow-up appointment, status post cryoablation of left superior pole renal cell carcinoma.  The treatment was performed 06/26/2016. We have recently seen her in the clinic as a follow-up visit 10/01/2016. After she left our clinic and we completed review of her postoperative imaging, a new and separate left kidney tumor was identified, measuring less than 2 cm. She comes today to discuss treatment options for this new presumed renal cell carcinoma.  Regarding the prior treatment, the treatment bed looks adequate, with the vital eyes to tissue at the superior pole.  The new tumor is at the anterior aspect of the left superior pole, a proximally 50% exophytic, 17 mm from the collecting system, and cranial relative to the polar line. Using the renal nephrometry scoring system, score is 4a.  Presumably this is a new RCC, and the imaging stage would be T1a.   She continues to wear oxygen given her COPD, and has had no recent exacerbation or hospitalization. She has resumed her activities of daily living, and enjoys going to the [facility] for water activities.    Past Medical History:  Diagnosis Date  . Arthritis   . Back pain   . Bell's palsy   . Bipolar disorder ([place])   . Bronchitis   . Chronic low back pain 05/09/2015  . COPD (chronic obstructive pulmonary disease) ([place])    hyoxia during sleep- using O2 as needed  . Cough with expectoration 05/21/16   recent diagnosis of bronchitis  . Cyst of right kidney   . Diabetes mellitus without complication ([place])    type 2  . Frequency of urination   . GERD (gastroesophageal reflux disease)   . Glaucoma   . History of blood  transfusion   . History of colon polyps   . History of hiatal hernia   . History of tobacco use   . Hypercalcemia   . Hyperlipidemia   . Hypertension   . Memory disorder 09/05/2014  . On home oxygen therapy    3L/M [place] at night   . Schizo-affective psychosis ([place])   . Shortness of breath dyspnea    exertion   . Uterine fibroid     Past Surgical History:  Procedure Laterality Date  . ABDOMINAL HYSTERECTOMY    . APPENDECTOMY    . COLONOSCOPY W/ POLYPECTOMY    . IR GENERIC HISTORICAL  04/29/2016   IR RADIOLOGIST EVAL & MGMT 04/29/2016 Corrie Mckusick, DO [facility] INTERV RAD  . IR GENERIC HISTORICAL  10/01/2016   IR RADIOLOGIST EVAL & MGMT 10/01/2016 [facility] INTERV RAD  . LUMBAR LAMINECTOMY/DECOMPRESSION MICRODISCECTOMY N/A 08/14/2015   Procedure: LUMBAR DECOMPRESSION MICRODISCECTOMY L3-S1;  Surgeon: Melina Schools, MD;  Location: [place];  Service: Orthopedics;  Laterality: N/A;  . TONSILLECTOMY AND ADENOIDECTOMY      Allergies: Codeine and Prednisone  Medications: Prior to Admission medications   Medication Sig Start Date End Date Taking? Authorizing Provider  albuterol (PROVENTIL HFA;VENTOLIN HFA) 108 (90 BASE) MCG/ACT inhaler Inhale 2 puffs into the lungs every 6 (six) hours as needed for wheezing or shortness of breath. 08/27/14  Yes Kerrie Buffalo, NP  albuterol (PROVENTIL) (2.5 MG/3ML) 0.083% nebulizer solution Take 2.5 mg by nebulization every 6 (six) hours as needed  for wheezing or shortness of breath.   Yes Historical Provider, MD  ARIPiprazole (ABILIFY) 20 MG tablet Take 1 tablet (20 mg total) by mouth at bedtime. 08/27/14  Yes Kerrie Buffalo, NP  Cholecalciferol (VITAMIN D3 PO) Take 1 tablet by mouth daily.   Yes Historical Provider, MD  donepezil (ARICEPT) 5 MG tablet Take 1 tablet (5 mg total) by mouth at bedtime. 08/27/14  Yes Kerrie Buffalo, NP  dorzolamide-timolol (COSOPT) 22.3-6.8 MG/ML ophthalmic solution Place 1 drop into both eyes 2 (two) times daily.   Yes Historical  Provider, MD  glimepiride (AMARYL) 4 MG tablet Take 1 tablet (4 mg total) by mouth daily with breakfast. 08/27/14  Yes Kerrie Buffalo, NP  glucose blood (ACCU-CHEK AVIVA) test strip 1 each by Other route 2 (two) times daily. 08/04/16  Yes Jearld Fenton, NP  hydrOXYzine (ATARAX/VISTARIL) 25 MG tablet Take 25 mg by mouth at bedtime.  04/17/16  Yes Historical Provider, MD  Ipratropium-Albuterol (COMBIVENT) 20-100 MCG/ACT AERS respimat Inhale 1 puff into the lungs every 6 (six) hours.   Yes Historical Provider, MD  irbesartan (AVAPRO) 300 MG tablet Take 1 tablet (300 mg total) by mouth daily. 12/05/15  Yes Tanda Rockers, MD  lamoTRIgine (LAMICTAL) 100 MG tablet Take 1 tablet (100 mg total) by mouth every evening. Patient taking differently: Take 100 mg by mouth daily after supper.  08/27/14  Yes Kerrie Buffalo, NP  latanoprost (XALATAN) 0.005 % ophthalmic solution Place 1 drop into both eyes at bedtime. 08/27/14  Yes Kerrie Buffalo, NP  metFORMIN (GLUCOPHAGE) 1000 MG tablet Take 1 tablet (1,000 mg total) by mouth 2 (two) times daily with a meal. 08/27/14  Yes Kerrie Buffalo, NP  mirtazapine (REMERON) 7.5 MG tablet Take 1 tablet (7.5 mg total) by mouth at bedtime. 08/27/14  Yes Kerrie Buffalo, NP  Multiple Vitamin (MULTIVITAMIN) capsule Take 1 capsule by mouth daily.   Yes Historical Provider, MD  OXYGEN Inhale 3 L into the lungs at bedtime.    Yes Historical Provider, MD  polyethylene glycol (MIRALAX / GLYCOLAX) packet Take 17 g by mouth daily. 09/08/16  Yes Geradine Girt, DO  Selenium Sulf-Pyrithione-Urea 2.25 % SHAM Apply 1 application topically every 14 (fourteen) days. Applied to scalp for dermatitis. 01/25/15  Yes Historical Provider, MD  senna (SENOKOT) 8.6 MG tablet Take 1 tablet by mouth daily.   Yes Historical Provider, MD  simvastatin (ZOCOR) 40 MG tablet Take 1 tablet (40 mg total) by mouth daily. Patient taking differently: Take 40 mg by mouth daily after supper.  08/27/14  Yes Kerrie Buffalo, NP    benzonatate (TESSALON) 100 MG capsule Take 1 capsule (100 mg total) by mouth 2 (two) times daily as needed for cough. Patient not taking: Reported on 11/11/2016 06/01/16   Jearld Fenton, NP  bisacodyl (DULCOLAX) 10 MG suppository Place 1 suppository (10 mg total) rectally daily as needed for moderate constipation. Patient not taking: Reported on 11/11/2016 09/08/16   Geradine Girt, DO  budesonide-formoterol (SYMBICORT) 160-4.5 MCG/ACT inhaler Inhale 2 puffs into the lungs 2 (two) times daily.    Historical Provider, MD  esomeprazole (NEXIUM) 20 MG capsule Take 20 mg by mouth daily.     Historical Provider, MD  lactulose (CHRONULAC) 10 GM/15ML solution Take 30 mLs (20 g total) by mouth 2 (two) times daily as needed for mild constipation. Patient not taking: Reported on 11/11/2016 09/08/16   Geradine Girt, DO  methocarbamol (ROBAXIN) 500 MG tablet Take 1 tablet (500 mg total)  by mouth 3 (three) times daily as needed for muscle spasms. Patient not taking: Reported on 11/11/2016 08/14/15   Melina Schools, MD  ondansetron (ZOFRAN) 4 MG tablet Take 1 tablet (4 mg total) by mouth every 8 (eight) hours as needed for nausea or vomiting. Patient not taking: Reported on 11/11/2016 08/14/15   Melina Schools, MD     Family History  Problem Relation Age of Onset  . Dementia Mother   . Alzheimer's disease Mother   . Heart disease Mother   . Hypertension Mother   . Diabetes Mother   . Diabetes Father   . Alzheimer's disease Sister   . Heart disease Brother   . Heart attack Brother   . Bladder Cancer Neg Hx   . Kidney cancer Neg Hx   . Prostate cancer Neg Hx     Social History   Social History  . Marital status: Divorced    Spouse name: N/A  . Number of children: 1  . Years of education: college 4   Occupational History  . disabled    Social History Main Topics  . Smoking status: Former Smoker    Packs/day: 1.00    Years: 28.00    Types: Cigarettes    Quit date: 07/15/2015  . Smokeless  tobacco: Never Used  . Alcohol use No  . Drug use: No  . Sexual activity: No   Other Topics Concern  . Not on file   Social History Narrative   Patient is right handed.   Patient drinks 2 cups caffeine daily.    ECOG Status: 1 - Symptomatic but completely ambulatory  Review of Systems: A 12 point ROS discussed and pertinent positives are indicated in the HPI above.  All other systems are negative.  Review of Systems  Vital Signs: BP (!) 127/57 (BP Location: Right Arm, Patient Position: Sitting, Cuff Size: Normal)   Pulse 86   Temp 98 F (36.7 C) (Oral)   Resp 16   Ht 5' 3.75" (1.619 m)   Wt 205 lb (93 kg)   SpO2 99% Comment: O2 at 2L/nasal cannula  BMI 35.46 kg/m   Physical Exam  Mallampati Score:     Imaging: Ct Chest Wo Contrast  Result Date: 11/10/2016 CLINICAL DATA:  Follow-up pulmonary nodules and adenopathy on prior chest CT. Chronic shortness of breath. EXAM: CT CHEST WITHOUT CONTRAST TECHNIQUE: Multidetector CT imaging of the chest was performed following the standard protocol without IV contrast. COMPARISON:  PE study 02/11/2016; CT angiography 11/06/2015. FINDINGS: Cardiovascular: Normal heart size. No pericardial effusion. Coronary arterial vascular calcifications. Aortic vascular calcifications. Mediastinum/Nodes: Interval enlargement in right axillary adenopathy with a right axillary lymph node measuring 11 mm (image 36; series 2), previously 7 mm. No left-sided axillary adenopathy. Slight interval decrease in size of mediastinal and left hilar adenopathy. Reference left hilar lymph node measures 8 mm (image 70; series 2), previously 15 mm. Reference precarinal lymph node measures 14 mm (image 54; series 2), previously 17 mm. Lungs/Pleura: Central airways are patent. Centrilobular and paraseptal emphysematous change. Interval decrease in size of previously described subpleural 4 mm right upper lobe pulmonary nodule, not well demonstrated on current exam. No new  or enlarging pulmonary nodules are identified. No pleural effusion or pneumothorax. Upper Abdomen: Unremarkable. Musculoskeletal: No aggressive or acute appearing osseous lesions. IMPRESSION: Interval increase in size of right axillary lymphadenopathy which appears to be unilateral. This is indeterminate in etiology however a malignant process is not excluded. Slight interval decrease in size of  previously described mediastinal lymphadenopathy. Previously described right upper lobe pulmonary nodule is not present on current examination. No suspicious pulmonary nodules are identified. Aortic atherosclerosis. Emphysematous change. Electronically Signed   By: Lovey Newcomer M.D.   On: 11/10/2016 15:48    Labs:  CBC:  Recent Labs  05/21/16 1205 06/23/16 1215 09/04/16 1241 09/06/16 0530  WBC 9.3 6.8 7.4 6.0  HGB 11.7* 12.2 11.9* 10.5*  HCT 35.6* 37.9 37.2 32.2*  PLT 343 341 358 323    COAGS:  Recent Labs  05/21/16 1205 06/23/16 1215  INR 0.92 0.98  APTT 37 35    BMP:  Recent Labs  06/26/16 1005 09/04/16 1241 09/05/16 1030 09/06/16 0530  NA 138 140 140 141  K 4.7 5.3* 5.1 4.9  CL 108 111 110 114*  CO2 22 21* 24 20*  GLUCOSE 126* 114* 156* 106*  BUN 23* 33* 25* 24*  CALCIUM 10.0 10.4* 9.8 9.5  CREATININE 1.61* 1.63* 1.57* 1.53*  GFRNONAA 33* 32* 33* 34*  GFRAA 38* 37* 39* 40*    LIVER FUNCTION TESTS:  Recent Labs  03/19/16 1129 09/05/16 1030  BILITOT 0.3 0.6  AST 14 17  ALT 12 12*  ALKPHOS 75 60  PROT 7.7 7.7  ALBUMIN 4.4 3.8    TUMOR MARKERS: No results for input(s): AFPTM, CEA, CA199, CHROMGRNA in the last 8760 hours.  Assessment and Plan:  Ms Richins is a 65 year old female with a history of pathologic proven renal cell carcinoma, treated with cryoablation 06/26/2016. On her follow-up imaging, a new and separate left renal tumor has been identified, presumably a second renal cell carcinoma which would be stage TIa.  I discussed with her again thermal  ablation, specifically cryotherapy. Specific risks that were discussed included: Bleeding, infection, injury to adjacent structures including renal collecting system, bowel, GI system, recurrence, nerve injury, need for further procedure/surgery, pneumothorax, skin injury, tumor tract seeding, cardiopulmonary collapse, death. In addition, I also discussed with her the risks of which she is where regarding general anesthesia which we recommend.  At this time, she feels compelled given her cardiopulmonary disease to pursue an appointment with a cardiologist. At this time she has no cardiologist. I feel this is a good idea, and she will call her primary doctor for a referral.  She does wish to proceed with thermal ablation of this second site, of which I feel she is a good candidate. We will plan on scheduling her at the earliest possible, which may take several weeks.  Plan:  She will pursue a referral for cardiology appointment through her primary care physician. Schedule for CT-guided cryoablation of new left renal tumor, presumably RCC.   Thank you for this interesting consult.  I greatly enjoyed meeting Elizabeth Thompson and look forward to participating in their care.  A copy of this report was sent to the requesting provider on this date.  Electronically Signed: Corrie Mckusick 11/11/2016, 9:35 AM   I spent a total of    25 Minutes in face to face in clinical consultation, greater than 50% of which was counseling/coordinating care for status post cryotherapy of left renal cell carcinoma, possible treatment of new left renal cell carcinoma with cryotherapy.

## 2016-11-12 NOTE — Progress Notes (Signed)
[email or web address]

## 2016-11-16 NOTE — Progress Notes (Signed)
lmtcb

## 2016-11-17 ENCOUNTER — Other Ambulatory Visit: Payer: Self-pay | Admitting: Internal Medicine

## 2016-11-23 ENCOUNTER — Encounter: Payer: Self-pay | Admitting: Internal Medicine

## 2016-11-23 ENCOUNTER — Encounter: Payer: Medicare Other | Admitting: Internal Medicine

## 2016-11-23 NOTE — Progress Notes (Signed)
Letter mailed

## 2016-11-23 NOTE — Progress Notes (Deleted)
HPI:  Pt presents to the clinic today for her Medicare Wellness Exam. She is also due for follow up of chronic conditions.   HTN: Her BP today is 150/98 but she reports she has not taken her Irbesartan today. She denies chest pain or shortness of breath.   Gait disorder secondary to chronic back pain: She walks with a rollator. She takes Ibuprofen daily and Robaxin as needed.    COPD: She is no longer smoking. She takes her Combivent and Symbicort as prescribed. She only has to use her Albuterol 2 x week.   DM2: Her sugars range 94-160. She takes Amaryl and Metformin as prescribed. She does frequently have loose stool. She is getting her eye exam today. She does not take flu shots. Prevnar 2015. Pneumovax never. She follows with Dr. Cruzita Lederer.   HLD: She denies myalgias on Zocor. She tries to consume a low fat diet. She does not take baby ASA due to historly of bleeding ulcer.   Hypercalcemia: She is due to have her calcium checked. She follows with Dr. Cruzita Lederer.   OSA: She does not wear a CPAP. She does wear 2L of oxygen at night and when taking naps. She follows with Dr. Melvyn Novas.   Memory Impairment: She denies this but is taking Aricept daily. She follows with Dr. Jannifer Franklin.   Schizophrenia: Managed by Ms. Jones at [place]. She takes Lamictal, Hydroxyzine and Abilify. She denies SI/HI.   Insomnia: Sleeping well on Mirtazapine.   GERD: She denies breakthrough symptoms on Nexium  Past Medical History:  Diagnosis Date  . Arthritis   . Back pain   . Bell's palsy   . Bipolar disorder ([place])   . Bronchitis   . Chronic low back pain 05/09/2015  . COPD (chronic obstructive pulmonary disease) ([place])    hyoxia during sleep- using O2 as needed  . Cough with expectoration 05/21/16   recent diagnosis of bronchitis  . Cyst of right kidney   . Diabetes mellitus without complication ([place])    type 2  . Frequency of urination   . GERD (gastroesophageal reflux disease)   . Glaucoma   . History of blood  transfusion   . History of colon polyps   . History of hiatal hernia   . History of tobacco use   . Hypercalcemia   . Hyperlipidemia   . Hypertension   . Memory disorder 09/05/2014  . On home oxygen therapy    3L/M [place] at night   . Schizo-affective psychosis ([place])   . Shortness of breath dyspnea    exertion   . Uterine fibroid     Current Outpatient Prescriptions  Medication Sig Dispense Refill  . albuterol (PROVENTIL HFA;VENTOLIN HFA) 108 (90 BASE) MCG/ACT inhaler Inhale 2 puffs into the lungs every 6 (six) hours as needed for wheezing or shortness of breath.    [name] albuterol (PROVENTIL) (2.5 MG/3ML) 0.083% nebulizer solution USE 1 VIAL IN NEBULIZER THREE TIMES DAILY AS NEEDED 75 mL 0  . ARIPiprazole (ABILIFY) 20 MG tablet Take 1 tablet (20 mg total) by mouth at bedtime. 30 tablet 0  . benzonatate (TESSALON) 100 MG capsule Take 1 capsule (100 mg total) by mouth 2 (two) times daily as needed for cough. (Patient not taking: Reported on 11/11/2016) 30 capsule 0  . bisacodyl (DULCOLAX) 10 MG suppository Place 1 suppository (10 mg total) rectally daily as needed for moderate constipation. (Patient not taking: Reported on 11/11/2016) 12 suppository 0  . budesonide-formoterol (SYMBICORT) 160-4.5 MCG/ACT inhaler Inhale  2 puffs into the lungs 2 (two) times daily.    . Cholecalciferol (VITAMIN D3 PO) Take 1 tablet by mouth daily.    [name] donepezil (ARICEPT) 5 MG tablet Take 1 tablet (5 mg total) by mouth at bedtime. 30 tablet 0  . dorzolamide-timolol (COSOPT) 22.3-6.8 MG/ML ophthalmic solution Place 1 drop into both eyes 2 (two) times daily.    [name] esomeprazole (NEXIUM) 20 MG capsule Take 20 mg by mouth daily.     [name] glimepiride (AMARYL) 4 MG tablet Take 1 tablet (4 mg total) by mouth daily with breakfast.    . glucose blood (ACCU-CHEK AVIVA) test strip 1 each by Other route 2 (two) times daily. 200 each 5  . hydrOXYzine (ATARAX/VISTARIL) 25 MG tablet Take 25 mg by mouth at bedtime.     .  Ipratropium-Albuterol (COMBIVENT) 20-100 MCG/ACT AERS respimat Inhale 1 puff into the lungs every 6 (six) hours.    . irbesartan (AVAPRO) 300 MG tablet Take 1 tablet (300 mg total) by mouth daily. 30 tablet 30  . lactulose (CHRONULAC) 10 GM/15ML solution Take 30 mLs (20 g total) by mouth 2 (two) times daily as needed for mild constipation. (Patient not taking: Reported on 11/11/2016) 240 mL 0  . lamoTRIgine (LAMICTAL) 100 MG tablet Take 1 tablet (100 mg total) by mouth every evening. (Patient taking differently: Take 100 mg by mouth daily after supper. ) 30 tablet 0  . latanoprost (XALATAN) 0.005 % ophthalmic solution Place 1 drop into both eyes at bedtime. 2.5 mL 12  . metFORMIN (GLUCOPHAGE) 1000 MG tablet Take 1 tablet (1,000 mg total) by mouth 2 (two) times daily with a meal.    . methocarbamol (ROBAXIN) 500 MG tablet Take 1 tablet (500 mg total) by mouth 3 (three) times daily as needed for muscle spasms. (Patient not taking: Reported on 11/11/2016) 60 tablet 0  . mirtazapine (REMERON) 7.5 MG tablet Take 1 tablet (7.5 mg total) by mouth at bedtime. 30 tablet 0  . Multiple Vitamin (MULTIVITAMIN) capsule Take 1 capsule by mouth daily.    . ondansetron (ZOFRAN) 4 MG tablet Take 1 tablet (4 mg total) by mouth every 8 (eight) hours as needed for nausea or vomiting. (Patient not taking: Reported on 11/11/2016) 20 tablet 0  . OXYGEN Inhale 3 L into the lungs at bedtime.     . polyethylene glycol (MIRALAX / GLYCOLAX) packet Take 17 g by mouth daily. 14 each 0  . Selenium Sulf-Pyrithione-Urea 2.25 % SHAM Apply 1 application topically every 14 (fourteen) days. Applied to scalp for dermatitis.  3  . senna (SENOKOT) 8.6 MG tablet Take 1 tablet by mouth daily.    . simvastatin (ZOCOR) 40 MG tablet Take 1 tablet (40 mg total) by mouth daily. (Patient taking differently: Take 40 mg by mouth daily after supper. ) 30 tablet    No current facility-administered medications for this visit.     Allergies  Allergen  Reactions  . Codeine Nausea And Vomiting  . Prednisone Other (See Comments)    Has to monitor because she is a diabetic     Family History  Problem Relation Age of Onset  . Dementia Mother   . Alzheimer's disease Mother   . Heart disease Mother   . Hypertension Mother   . Diabetes Mother   . Diabetes Father   . Alzheimer's disease Sister   . Heart disease Brother   . Heart attack Brother   . Bladder Cancer Neg Hx   . Kidney cancer  Neg Hx   . Prostate cancer Neg Hx     Social History   Social History  . Marital status: Divorced    Spouse name: N/A  . Number of children: 1  . Years of education: college 4   Occupational History  . disabled    Social History Main Topics  . Smoking status: Former Smoker    Packs/day: 1.00    Years: 28.00    Types: Cigarettes    Quit date: 07/15/2015  . Smokeless tobacco: Never Used  . Alcohol use No  . Drug use: No  . Sexual activity: No   Other Topics Concern  . Not on file   Social History Narrative   Patient is right handed.   Patient drinks 2 cups caffeine daily.    Hospitiliaztions:  Health Maintenance:    Flu:  Tetanus:  Pneumovax:  Prevnar:  Zostavax:  Mammogram:  Pap Smear:  PSA:  Bone Density:  Colon Screening:  Eye Doctor:  Dental Exam:   Providers:   PCP:  Dermatologist:  Cardiologist:  ENT:  Gastroenterologist:  Pulmonologist:   I have personally reviewed and have noted:  1. The patient's medical and social history 2. Their use of alcohol, tobacco or illicit drugs 3. Their current medications and supplements 4. The patient's functional ability including ADL's, fall risks, home safety risks and  hearing or visual impairment. 5. Diet and physical activities 6. Evidence for depression or mood disorder  Subjective:   Review of Systems:   Constitutional: Denies fever, malaise, fatigue, headache or abrupt weight changes.  HEENT: Denies eye pain, eye redness, ear pain, ringing in the ears,  wax buildup, runny nose, nasal congestion, bloody nose, or sore throat. Respiratory: Denies difficulty breathing, shortness of breath, cough or sputum production.   Cardiovascular: Denies chest pain, chest tightness, palpitations or swelling in the hands or feet.  Gastrointestinal: Denies abdominal pain, bloating, constipation, diarrhea or blood in the stool.  GU: Denies urgency, frequency, pain with urination, burning sensation, blood in urine, odor or discharge. Musculoskeletal: Denies decrease in range of motion, difficulty with gait, muscle pain or joint pain and swelling.  Skin: Denies redness, rashes, lesions or ulcercations.  Neurological: Denies dizziness, difficulty with memory, difficulty with speech or problems with balance and coordination.  Psych: Denies anxiety, depression, SI/HI.  No other specific complaints in a complete review of systems (except as listed in HPI above).  Objective:  PE:   There were no vitals taken for this visit. Wt Readings from Last 3 Encounters:  11/11/16 205 lb (93 kg)  09/17/16 210 lb 8 oz (95.5 kg)  09/04/16 208 lb 15.9 oz (94.8 kg)    General: Appears their stated age, well developed, well nourished in NAD. Skin: Warm, dry and intact. No rashes, lesions or ulcerations noted. HEENT: Head: normal shape and size; Eyes: sclera white, no icterus, conjunctiva pink, PERRLA and EOMs intact; Ears: Tm's gray and intact, normal light reflex; Throat/Mouth: Teeth present, mucosa pink and moist, no exudate, lesions or ulcerations noted.  Neck: Neck supple, trachea midline. No masses, lumps or thyromegaly present.  Cardiovascular: Normal rate and rhythm. S1,S2 noted.  No murmur, rubs or gallops noted. No JVD or BLE edema. No carotid bruits noted. Pulmonary/Chest: Normal effort and positive vesicular breath sounds. No respiratory distress. No wheezes, rales or ronchi noted.  Abdomen: Soft and nontender. Normal bowel sounds. No distention or masses noted. Liver,  spleen and kidneys non palpable. Musculoskeletal: Normal range of motion. Strength 5/5 BUE/BLE.  No signs of joint swelling.  Neurological: Alert and oriented. Cranial nerves II-XII grossly intact. Coordination normal.  Psychiatric: Mood and affect normal. Behavior is normal. Judgment and thought content normal.   EKG:  BMET    Component Value Date/Time   NA 141 09/06/2016 0530   NA 142 09/05/2014 1459   K 4.9 09/06/2016 0530   CL 114 (H) 09/06/2016 0530   CO2 20 (L) 09/06/2016 0530   GLUCOSE 106 (H) 09/06/2016 0530   BUN 24 (H) 09/06/2016 0530   BUN 9 09/05/2014 1459   CREATININE 1.53 (H) 09/06/2016 0530   CALCIUM 9.5 09/06/2016 0530   GFRNONAA 34 (L) 09/06/2016 0530   GFRAA 40 (L) 09/06/2016 0530    Lipid Panel     Component Value Date/Time   CHOL 245 (H) 03/19/2016 1129   TRIG 234.0 (H) 03/19/2016 1129   HDL 61.60 03/19/2016 1129   CHOLHDL 4 03/19/2016 1129   VLDL 46.8 (H) 03/19/2016 1129    CBC    Component Value Date/Time   WBC 6.0 09/06/2016 0530   RBC 3.81 (L) 09/06/2016 0530   HGB 10.5 (L) 09/06/2016 0530   HCT 32.2 (L) 09/06/2016 0530   PLT 323 09/06/2016 0530   MCV 84.5 09/06/2016 0530   MCH 27.6 09/06/2016 0530   MCHC 32.6 09/06/2016 0530   RDW 14.4 09/06/2016 0530   LYMPHSABS 3.0 05/21/2016 1205   MONOABS 0.6 05/21/2016 1205   EOSABS 0.4 05/21/2016 1205   BASOSABS 0.0 05/21/2016 1205    Hgb A1C Lab Results  Component Value Date   HGBA1C 7.3 (H) 06/15/2016      Assessment and Plan:   Medicare Annual Wellness Visit:  Diet: Heart healthy or DM if diabetic Physical activity: Sedentary Depression/mood screen: Negative Hearing: Intact to whispered voice Visual acuity: Grossly normal, performs annual eye exam  ADLs: Capable Fall risk: None Home safety: Good Cognitive evaluation: Intact to orientation, naming, recall and repetition EOL planning: Adv directives, full code/ I agree  Preventative Medicine:   Next appointment:   Webb Silversmith, NP

## 2016-12-01 ENCOUNTER — Encounter: Payer: Self-pay | Admitting: Podiatry

## 2016-12-01 ENCOUNTER — Ambulatory Visit (INDEPENDENT_AMBULATORY_CARE_PROVIDER_SITE_OTHER): Payer: Medicare Other | Admitting: Podiatry

## 2016-12-01 VITALS — Ht 63.0 in | Wt 210.0 lb

## 2016-12-01 DIAGNOSIS — M79676 Pain in unspecified toe(s): Secondary | ICD-10-CM | POA: Diagnosis not present

## 2016-12-01 DIAGNOSIS — M5430 Sciatica, unspecified side: Secondary | ICD-10-CM | POA: Diagnosis not present

## 2016-12-01 DIAGNOSIS — B351 Tinea unguium: Secondary | ICD-10-CM | POA: Diagnosis not present

## 2016-12-01 DIAGNOSIS — Z0000 Encounter for general adult medical examination without abnormal findings: Secondary | ICD-10-CM | POA: Diagnosis not present

## 2016-12-01 DIAGNOSIS — R0602 Shortness of breath: Secondary | ICD-10-CM | POA: Diagnosis not present

## 2016-12-01 DIAGNOSIS — Z1211 Encounter for screening for malignant neoplasm of colon: Secondary | ICD-10-CM | POA: Diagnosis not present

## 2016-12-01 DIAGNOSIS — Z13228 Encounter for screening for other metabolic disorders: Secondary | ICD-10-CM | POA: Diagnosis not present

## 2016-12-01 NOTE — Progress Notes (Signed)
   Subjective:    Patient ID: Elizabeth Thompson, female    DOB: [birth year], 65 y.o.   MRN: [id number]  HPI this patient presents to the office with chief complaint of painful long thick nails. Patient states the nails are painful as she walks and wears her shoes. Patient stayed states that she is a diabetic without any complications. She presents the office for preventive foot care services.  Patient has been treated for cancer in kidney.    Review of Systems  Constitutional: Positive for activity change.  HENT:       Sinus problems  Respiratory: Positive for cough.   Gastrointestinal: Positive for diarrhea.  Genitourinary: Positive for urgency.  Musculoskeletal: Positive for back pain and gait problem.  Skin:       Change in nails  Allergic/Immunologic: Positive for environmental allergies.  Neurological: Positive for tremors.       Objective:   Physical Exam GENERAL APPEARANCE: Alert, conversant. Appropriately groomed. No acute distress.  VASCULAR: Pedal pulses palpable at  [facility] and PT bilateral.  Capillary refill time is immediate to all digits,  Normal temperature gradient.  Digital hair growth is present bilateral  NEUROLOGIC: sensation is normal to 5.07 monofilament at 5/5 sites bilateral.  Light touch is intact bilateral, Muscle strength normal.  MUSCULOSKELETAL: acceptable muscle strength, tone and stability bilateral.  Intrinsic muscluature intact bilateral.  Rectus appearance of foot and digits noted bilateral.   DERMATOLOGIC: skin color, texture, and turgor are within normal limits.  No preulcerative lesions or ulcers  are seen, no interdigital maceration noted.  No open lesions present.  . No drainage noted.  NAILS  Thick disfigured discolored nails 2-5 B/L       Assessment & Plan:  Onychomycosis B/L   Diabetes with no complications.  IE  Debridement of nails B/L  RTC 3 months   Gardiner Barefoot DPM

## 2016-12-02 ENCOUNTER — Encounter: Payer: Self-pay | Admitting: Interventional Radiology

## 2016-12-02 DIAGNOSIS — J449 Chronic obstructive pulmonary disease, unspecified: Secondary | ICD-10-CM | POA: Diagnosis not present

## 2016-12-02 DIAGNOSIS — J9621 Acute and chronic respiratory failure with hypoxia: Secondary | ICD-10-CM | POA: Diagnosis not present

## 2016-12-08 DIAGNOSIS — J449 Chronic obstructive pulmonary disease, unspecified: Secondary | ICD-10-CM | POA: Diagnosis not present

## 2016-12-08 DIAGNOSIS — J9621 Acute and chronic respiratory failure with hypoxia: Secondary | ICD-10-CM | POA: Diagnosis not present

## 2016-12-16 DIAGNOSIS — R0602 Shortness of breath: Secondary | ICD-10-CM | POA: Diagnosis not present

## 2016-12-16 DIAGNOSIS — R0902 Hypoxemia: Secondary | ICD-10-CM | POA: Diagnosis not present

## 2016-12-17 ENCOUNTER — Encounter: Payer: Self-pay | Admitting: Interventional Radiology

## 2016-12-24 DIAGNOSIS — I10 Essential (primary) hypertension: Secondary | ICD-10-CM | POA: Diagnosis not present

## 2016-12-24 DIAGNOSIS — E1122 Type 2 diabetes mellitus with diabetic chronic kidney disease: Secondary | ICD-10-CM | POA: Diagnosis not present

## 2016-12-24 DIAGNOSIS — C642 Malignant neoplasm of left kidney, except renal pelvis: Secondary | ICD-10-CM | POA: Diagnosis not present

## 2016-12-24 DIAGNOSIS — R0609 Other forms of dyspnea: Secondary | ICD-10-CM | POA: Diagnosis not present

## 2016-12-25 ENCOUNTER — Encounter: Payer: Self-pay | Admitting: Internal Medicine

## 2016-12-25 DIAGNOSIS — E1122 Type 2 diabetes mellitus with diabetic chronic kidney disease: Secondary | ICD-10-CM | POA: Diagnosis not present

## 2016-12-25 DIAGNOSIS — R0602 Shortness of breath: Secondary | ICD-10-CM | POA: Diagnosis not present

## 2016-12-25 DIAGNOSIS — Z01810 Encounter for preprocedural cardiovascular examination: Secondary | ICD-10-CM | POA: Diagnosis not present

## 2016-12-25 DIAGNOSIS — I10 Essential (primary) hypertension: Secondary | ICD-10-CM | POA: Diagnosis not present

## 2016-12-28 ENCOUNTER — Encounter: Payer: Self-pay | Admitting: Internal Medicine

## 2016-12-28 ENCOUNTER — Telehealth: Payer: Self-pay | Admitting: Internal Medicine

## 2016-12-28 DIAGNOSIS — R0600 Dyspnea, unspecified: Secondary | ICD-10-CM | POA: Insufficient documentation

## 2016-12-28 NOTE — Telephone Encounter (Signed)
Do not see it in any of the usual places - best to just fax it over and I'll look at it and get back to Dr Nadyne Coombes

## 2016-12-28 NOTE — Telephone Encounter (Signed)
Tried to call Rise Paganini back because I have not seen the fax yet, Had to leave a vm, left vm with the triage fax number to resend papers. Will hold in triage to see if we receive papers.

## 2016-12-28 NOTE — Telephone Encounter (Signed)
Spoke with Elizabeth Thompson and she stated she will fax it over. Will hold in triage to ensure that we have received the fax. She has sent this to the fax machine up front.

## 2016-12-28 NOTE — Telephone Encounter (Signed)
Spoke with Rise Paganini with Dr. Irven Shelling office, who states Dr. Einar Gip has routed pt's stress test to [facility] to be reviewed. Rise Paganini ask that we contact their office, if Dr. Melvyn Novas did not receive these results.  Dr. Melvyn Novas please confirm if you have received this? Thanks.

## 2016-12-28 NOTE — Telephone Encounter (Signed)
Papers have been rec'd and I handed them to [facility] personally. Nothing more needed at this time.

## 2017-01-02 DIAGNOSIS — J449 Chronic obstructive pulmonary disease, unspecified: Secondary | ICD-10-CM | POA: Diagnosis not present

## 2017-01-02 DIAGNOSIS — J9621 Acute and chronic respiratory failure with hypoxia: Secondary | ICD-10-CM | POA: Diagnosis not present

## 2017-01-08 ENCOUNTER — Other Ambulatory Visit: Payer: Self-pay | Admitting: Interventional Radiology

## 2017-01-08 DIAGNOSIS — C642 Malignant neoplasm of left kidney, except renal pelvis: Secondary | ICD-10-CM

## 2017-01-08 DIAGNOSIS — J449 Chronic obstructive pulmonary disease, unspecified: Secondary | ICD-10-CM | POA: Diagnosis not present

## 2017-01-08 DIAGNOSIS — J9621 Acute and chronic respiratory failure with hypoxia: Secondary | ICD-10-CM | POA: Diagnosis not present

## 2017-01-11 DIAGNOSIS — I10 Essential (primary) hypertension: Secondary | ICD-10-CM | POA: Diagnosis not present

## 2017-01-11 DIAGNOSIS — C642 Malignant neoplasm of left kidney, except renal pelvis: Secondary | ICD-10-CM | POA: Diagnosis not present

## 2017-01-11 DIAGNOSIS — R0609 Other forms of dyspnea: Secondary | ICD-10-CM | POA: Diagnosis not present

## 2017-01-11 DIAGNOSIS — Z01810 Encounter for preprocedural cardiovascular examination: Secondary | ICD-10-CM | POA: Diagnosis not present

## 2017-01-15 DIAGNOSIS — H401132 Primary open-angle glaucoma, bilateral, moderate stage: Secondary | ICD-10-CM | POA: Diagnosis not present

## 2017-01-15 DIAGNOSIS — H2513 Age-related nuclear cataract, bilateral: Secondary | ICD-10-CM | POA: Diagnosis not present

## 2017-01-19 ENCOUNTER — Other Ambulatory Visit: Payer: Self-pay | Admitting: Internal Medicine

## 2017-01-22 ENCOUNTER — Ambulatory Visit (INDEPENDENT_AMBULATORY_CARE_PROVIDER_SITE_OTHER): Payer: Medicare Other | Admitting: Urology

## 2017-01-22 ENCOUNTER — Encounter: Payer: Self-pay | Admitting: Urology

## 2017-01-22 VITALS — BP 140/85 | HR 99 | Ht 63.0 in | Wt 215.6 lb

## 2017-01-22 DIAGNOSIS — N183 Chronic kidney disease, stage 3 unspecified: Secondary | ICD-10-CM

## 2017-01-22 DIAGNOSIS — C642 Malignant neoplasm of left kidney, except renal pelvis: Secondary | ICD-10-CM | POA: Diagnosis not present

## 2017-01-23 NOTE — Progress Notes (Signed)
01/22/2017 11:55 AM   Elizabeth Thompson 04/03/1951 [id number]  Referring provider: Jearld Fenton, NP [address] [place], [place] [address]  Chief Complaint  Patient presents with  . Follow-up    kidney cancer    HPI: 66 year old female with schizoaffective disorder, COPD on home 02 with 23 mm LUP RCC s/p Cryoablation on 06/2016. Pathology was consistent with clear cell carcinoma, Fuhrman grade 1.    Follow-up imaging on 09/2016 showed adequate post ablation changes to the lesion in question. Of note, there was a new lesion of the left anterior aspect of the kidney measuring 1.2 cm which was not previously appreciated.  He is now scheduled to undergo cryoablation of this lesion next month.  She tolerated her first procedure without difficulty. No UTIs, flank pain, gross hematuria. She has no complaints today including no fevers, night sweats, or weight loss.  After her initial visit with me, she was referred to medical oncology for further evaluation of diffuse lymphadenopathy including in the chest.  These are felt to be fairly insignificant now being followed by her PCP. Most recent chest CT shows interval improvement.  She does have increased unilateral axillary lymphadenopathy which is being followed.  She does have baseline stage III CKD. Cr has remained stable.    Denies gross hematuria or flank pain.     PMH: Past Medical History:  Diagnosis Date  . Arthritis   . Back pain   . Bell's palsy   . Bipolar disorder ([place])   . Bronchitis   . Chronic low back pain 05/09/2015  . COPD (chronic obstructive pulmonary disease) ([place])    hyoxia during sleep- using O2 as needed  . Cough with expectoration 05/21/16   recent diagnosis of bronchitis  . Cyst of right kidney   . Diabetes mellitus without complication ([place])    type 2  . Frequency of urination   . GERD (gastroesophageal reflux disease)   . Glaucoma   . History of blood transfusion   . History of colon polyps     . History of hiatal hernia   . History of tobacco use   . Hypercalcemia   . Hyperlipidemia   . Hypertension   . Memory disorder 09/05/2014  . On home oxygen therapy    3L/M [place] at night   . Schizo-affective psychosis ([place])   . Shortness of breath dyspnea    exertion   . Uterine fibroid     Surgical History: Past Surgical History:  Procedure Laterality Date  . ABDOMINAL HYSTERECTOMY    . APPENDECTOMY    . COLONOSCOPY W/ POLYPECTOMY    . IR GENERIC HISTORICAL  04/29/2016   IR RADIOLOGIST EVAL & MGMT 04/29/2016 Corrie Mckusick, DO [facility] INTERV RAD  . IR GENERIC HISTORICAL  10/01/2016   IR RADIOLOGIST EVAL & MGMT 10/01/2016 [facility] INTER[facility]  . IR GENERIC HISTORICAL  11/11/2016   IR RADIOLOGIST EVAL & MGMT 11/11/2016 Corrie Mckusick, DO [facility] INTER[facility]  . IR GENERIC HISTORICAL  07/16/2016   IR RADIOLOGIST EVAL & MGMT 07/16/2016 Corrie Mckusick, DO [facility] INTER[facility]  . LUMBAR LAMINECTOMY/DECOMPRESSION MICRODISCECTOMY N/A 08/14/2015   Procedure: LUMBAR DECOMPRESSION MICRODISCECTOMY L3-S1;  Surgeon: Melina Schools, MD;  Location: [place];  Service: Orthopedics;  Laterality: N/A;  . TONSILLECTOMY AND ADENOIDECTOMY      Home Medications:  Allergies as of 01/22/2017      Reactions   Codeine Nausea And Vomiting   Prednisone Other (See Comments)   Has to  monitor because she is a diabetic       Medication List       Accurate as of 01/22/17 11:59 PM. Always use your most recent med list.          albuterol 108 (90 Base) MCG/ACT inhaler Commonly known as:  PROVENTIL HFA;VENTOLIN HFA Inhale 2 puffs into the lungs every 6 (six) hours as needed for wheezing or shortness of breath.   albuterol (2.5 MG/3ML) 0.083% nebulizer solution Commonly known as:  PROVENTIL USE 1 VIAL IN NEBULIZER THREE TIMES DAILY AS NEEDED   ARIPiprazole 20 MG tablet Commonly known as:  ABILIFY Take 1 tablet (20 mg total) by mouth at bedtime.   atorvastatin 40 MG tablet Commonly known as:  LIPITOR Take 40 mg by mouth  daily.   benzonatate 100 MG capsule Commonly known as:  TESSALON Take 1 capsule (100 mg total) by mouth 2 (two) times daily as needed for cough.   bisacodyl 10 MG suppository Commonly known as:  DULCOLAX Place 1 suppository (10 mg total) rectally daily as needed for moderate constipation.   budesonide-formoterol 160-4.5 MCG/ACT inhaler Commonly known as:  SYMBICORT Inhale 2 puffs into the lungs 2 (two) times daily.   donepezil 5 MG tablet Commonly known as:  ARICEPT Take 1 tablet (5 mg total) by mouth at bedtime.   dorzolamide-timolol 22.3-6.8 MG/ML ophthalmic solution Commonly known as:  COSOPT Place 1 drop into both eyes 2 (two) times daily.   esomeprazole 20 MG capsule Commonly known as:  NEXIUM Take 20 mg by mouth daily.   glimepiride 4 MG tablet Commonly known as:  AMARYL Take 1 tablet (4 mg total) by mouth daily with breakfast.   glucose blood test strip Commonly known as:  ACCU-CHEK AVIVA 1 each by Other route 2 (two) times daily.   hydrOXYzine 25 MG tablet Commonly known as:  ATARAX/VISTARIL Take 25 mg by mouth at bedtime.   Ipratropium-Albuterol 20-100 MCG/ACT Aers respimat Commonly known as:  COMBIVENT Inhale 1 puff into the lungs every 6 (six) hours.   irbesartan 300 MG tablet Commonly known as:  AVAPRO Take 1 tablet (300 mg total) by mouth daily.   lactulose 10 GM/15ML solution Commonly known as:  CHRONULAC Take 30 mLs (20 g total) by mouth 2 (two) times daily as needed for mild constipation.   lamoTRIgine 100 MG tablet Commonly known as:  LAMICTAL Take 1 tablet (100 mg total) by mouth every evening.   latanoprost 0.005 % ophthalmic solution Commonly known as:  XALATAN Place 1 drop into both eyes at bedtime.   metFORMIN 1000 MG tablet Commonly known as:  GLUCOPHAGE Take 1 tablet (1,000 mg total) by mouth 2 (two) times daily with a meal.   methocarbamol 500 MG tablet Commonly known as:  ROBAXIN Take 1 tablet (500 mg total) by mouth 3 (three)  times daily as needed for muscle spasms.   mirtazapine 7.5 MG tablet Commonly known as:  REMERON Take 1 tablet (7.5 mg total) by mouth at bedtime.   multivitamin capsule Take 1 capsule by mouth daily.   ondansetron 4 MG tablet Commonly known as:  ZOFRAN Take 1 tablet (4 mg total) by mouth every 8 (eight) hours as needed for nausea or vomiting.   OXYGEN Inhale 3 L into the lungs at bedtime.   polyethylene glycol packet Commonly known as:  MIRALAX / GLYCOLAX Take 17 g by mouth daily.   Selenium Sulf-Pyrithione-Urea 2.25 % Sham Apply 1 application topically every 14 (fourteen) days. Applied to  scalp for dermatitis.   senna 8.6 MG tablet Commonly known as:  SENOKOT Take 1 tablet by mouth daily.   simvastatin 40 MG tablet Commonly known as:  ZOCOR Take 1 tablet (40 mg total) by mouth daily.   VITAMIN D3 PO Take 1 tablet by mouth daily.       Allergies:  Allergies  Allergen Reactions  . Codeine Nausea And Vomiting  . Prednisone Other (See Comments)    Has to monitor because she is a diabetic     Family History: Family History  Problem Relation Age of Onset  . Dementia Mother   . Alzheimer's disease Mother   . Heart disease Mother   . Hypertension Mother   . Diabetes Mother   . Diabetes Father   . Alzheimer's disease Sister   . Heart disease Brother   . Heart attack Brother   . Bladder Cancer Neg Hx   . Kidney cancer Neg Hx   . Prostate cancer Neg Hx     Social History:  reports that she quit smoking about 18 months ago. Her smoking use included Cigarettes. She has a 28.00 pack-year smoking history. She has never used smokeless tobacco. She reports that she does not drink alcohol or use drugs.  ROS: UROLOGY Frequent Urination?: No Hard to postpone urination?: Yes Burning/pain with urination?: No Get up at night to urinate?: Yes Leakage of urine?: No Urine stream starts and stops?: No Trouble starting stream?: Yes Do you have to strain to urinate?:  No Blood in urine?: No Urinary tract infection?: No Sexually transmitted disease?: No Injury to kidneys or bladder?: Yes Painful intercourse?: No Weak stream?: No Currently pregnant?: No Vaginal bleeding?: No Last menstrual period?: n  Gastrointestinal Nausea?: No Vomiting?: No Indigestion/heartburn?: No Diarrhea?: No Constipation?: No  Constitutional Fever: No Night sweats?: No Weight loss?: No Fatigue?: No  Skin Skin rash/lesions?: No Itching?: No  Eyes Blurred vision?: No Double vision?: No  Ears/Nose/Throat Sore throat?: No Sinus problems?: No  Hematologic/Lymphatic Swollen glands?: No Easy bruising?: No  Cardiovascular Leg swelling?: Yes Chest pain?: No  Respiratory Cough?: No Shortness of breath?: Yes  Endocrine Excessive thirst?: No  Musculoskeletal Back pain?: Yes Joint pain?: No  Neurological Headaches?: No Dizziness?: No  Psychologic Depression?: No Anxiety?: No  Physical Exam: BP 140/85 (BP Location: Left Arm, Patient Position: Sitting, Cuff Size: Large)   Pulse 99   Ht _0  (1.6 m)   Wt 215 lb 9.6 oz (97.8 kg)   BMI 38.19 kg/m   Constitutional:  Alert and oriented, No acute distress.  Accompanied by sister. HEENT: [place] AT, moist mucus membranes.  Trachea midline, no masses. Cardiovascular: No clubbing, cyanosis, or edema. Respiratory: Normal respiratory effort, no increased work of breathing. GI: Abdomen is soft, nontender, nondistended, no abdominal masses.  GU: No CVA tenderness.  Skin: No rashes, bruises or suspicious lesions. Neurologic: Grossly intact, no focal deficits, moving all 4 extremities. Psychiatric: Normal mood and affect.  Laboratory Data: Lab Results  Component Value Date   WBC 6.0 09/06/2016   HGB 10.5 (L) 09/06/2016   HCT 32.2 (L) 09/06/2016   MCV 84.5 09/06/2016   PLT 323 09/06/2016    Lab Results  Component Value Date   CREATININE 1.53 (H) 09/06/2016    Lab Results  Component Value Date    HGBA1C 7.3 (H) 06/15/2016    Urinalysis    Component Value Date/Time   COLORURINE YELLOW 09/07/2016 [place] 09/07/2016 1821   LABSPEC 1.007  09/07/2016 1821   PHURINE 5.5 09/07/2016 1821   GLUCOSEU NEGATIVE 09/07/2016 1821   HGBUR NEGATIVE 09/07/2016 1821   BILIRUBINUR NEGATIVE 09/07/2016 1821   KETONESUR NEGATIVE 09/07/2016 1821   PROTEINUR NEGATIVE 09/07/2016 1821   UROBILINOGEN 0.2 08/17/2015 0109   NITRITE NEGATIVE 09/07/2016 1821   LEUKOCYTESUR NEGATIVE 09/07/2016 1821    Pertinent Imaging: CT scan from 09/2016 was reviewed personally today  Assessment & Plan:   1. Left multifocal RCC S/p cryoablation of 23 mm LUP lesion 06/2016 New 13 mm left anterior lesion seen on f/u CT 09/2016 Scheduled for local cryo of this new lesion 01/2017 Chest imaging up to date  2. CKD Baseline Cr ~1.5 which is stable Agree with nephron sparing protocols  Return in about 1 year (around 01/22/2018) for f/u RCC.  Hollice Espy, MD  [facility] [address], [place], [place] [address] [phone]

## 2017-01-27 DIAGNOSIS — R0989 Other specified symptoms and signs involving the circulatory and respiratory systems: Secondary | ICD-10-CM | POA: Diagnosis not present

## 2017-01-29 ENCOUNTER — Other Ambulatory Visit: Payer: Self-pay | Admitting: Radiology

## 2017-01-29 ENCOUNTER — Other Ambulatory Visit: Payer: Self-pay | Admitting: Student

## 2017-02-01 NOTE — Patient Instructions (Addendum)
Elizabeth Thompson  02/01/2017   Your procedure is scheduled on: 02-05-17   Report to [facility] Main  Entrance take [facility]  elevators to 3rd floor to  [place] at 6AM.  Call this number if you have problems the morning of surgery [phone]   Remember: ONLY 1 PERSON MAY GO WITH YOU TO SHORT STAY TO GET  READY MORNING OF [place].  Do not eat food or drink liquids :After Midnight.     Take these medicines the morning of surgery with A SIP OF WATER: tylenol as needed, esomeprazole(nexium) as needed, inhalers, nebulizer, eye drops  DO NOT TAKE ANY DIABETIC MEDICATIONS DAY OF YOUR SURGERY                               You may not have any metal on your body including hair pins and              piercings  Do not wear jewelry, make-up, lotions, powders or perfumes, deodorant             Do not wear nail polish.  Do not shave  48 hours prior to surgery.              Men may shave face and neck.   Do not bring valuables to the hospital. [place].  Contacts, dentures or bridgework may not be worn into surgery.  Leave suitcase in the car. After surgery it may be brought to your room.              Please read over the following fact sheets you were given: _____________________________________________________________________             How to Manage Your Diabetes Before and After Surgery  Why is it important to control my blood sugar before and after surgery? . Improving blood sugar levels before and after surgery helps healing and can limit problems. . A way of improving blood sugar control is eating a healthy diet by: o  Eating less sugar and carbohydrates o  Increasing activity/exercise o  Talking with your doctor about reaching your blood sugar goals . High blood sugars (greater than 180 mg/dL) can raise your risk of infections and slow your recovery, so you will need to focus on controlling your  diabetes during the weeks before surgery. . Make sure that the doctor who takes care of your diabetes knows about your planned surgery including the date and location.  How do I manage my blood sugar before surgery? . Check your blood sugar at least 4 times a day, starting 2 days before surgery, to make sure that the level is not too high or low. o Check your blood sugar the morning of your surgery when you wake up and every 2 hours until you get to the Short Stay unit. . If your blood sugar is less than 70 mg/dL, you will need to treat for low blood sugar: o Do not take insulin. o Treat a low blood sugar (less than 70 mg/dL) with  cup of clear juice (cranberry or apple), 4 glucose tablets, OR glucose gel. o Recheck blood sugar in 15 minutes after treatment (to make sure it is greater than 70 mg/dL).  If your blood sugar is not greater than 70 mg/dL on recheck, call [phone] for further instructions. . Report your blood sugar to the short stay nurse when you get to Short Stay.  . If you are admitted to the hospital after surgery: o Your blood sugar will be checked by the staff and you will probably be given insulin after surgery (instead of oral diabetes medicines) to make sure you have good blood sugar levels. o The goal for blood sugar control after surgery is 80-180 mg/dL.   WHAT DO I DO ABOUT MY DIABETES MEDICATION?  [name] Do not take oral diabetes medicines (pills) the morning of surgery.  . THE DAY BEFORE SURGERY 02-04-17, TAKE YOUR MORNING DOSE OF GLIMEPIRIDE(AMARYL) .  THE DAY BEFORE SURGERY 02-04-17, TAKE YOUR METFORMIN AS USUAL.      . THE MORNING OF SURGERY, DO NOT TAKE YOUR GLIMEPIRIDE(AMARYL) OR METFORMIN!!   Reviewed and Endorsed by [facility] Patient Education Committee, August 2015   [facility] - Preparing for Surgery Before surgery, you can play an important role.  Because skin is not sterile, your skin needs to be as free of germs as possible.  You can reduce the  number of germs on your skin by washing with CHG (chlorahexidine gluconate) soap before surgery.  CHG is an antiseptic cleaner which kills germs and bonds with the skin to continue killing germs even after washing. Please DO NOT use if you have an allergy to CHG or antibacterial soaps.  If your skin becomes reddened/irritated stop using the CHG and inform your nurse when you arrive at Short Stay. Do not shave (including legs and underarms) for at least 48 hours prior to the first CHG shower.  You may shave your face/neck. Please follow these instructions carefully:  1.  Shower with CHG Soap the night before surgery and the  morning of Surgery.  2.  If you choose to wash your hair, wash your hair first as usual with your  normal  shampoo.  3.  After you shampoo, rinse your hair and body thoroughly to remove the  shampoo.                           4.  Use CHG as you would any other liquid soap.  You can apply chg directly  to the skin and wash                       Gently with a scrungie or clean washcloth.  5.  Apply the CHG Soap to your body ONLY FROM THE NECK DOWN.   Do not use on face/ open                           Wound or open sores. Avoid contact with eyes, ears mouth and genitals (private parts).                       Wash face,  Genitals (private parts) with your normal soap.             6.  Wash thoroughly, paying special attention to the area where your surgery  will be performed.  7.  Thoroughly rinse your body with warm water from the neck down.  8.  DO NOT shower/wash with your normal soap after using and rinsing off  the CHG Soap.  9.  Pat yourself dry with a clean towel.            10.  Wear clean pajamas.            11.  Place clean sheets on your bed the night of your first shower and do not  sleep with pets. Day of Surgery : Do not apply any lotions/deodorants the morning of surgery.  Please wear clean clothes to the hospital/surgery center.  FAILURE TO FOLLOW  THESE INSTRUCTIONS MAY RESULT IN THE CANCELLATION OF YOUR SURGERY PATIENT SIGNATURE_________________________________  NURSE SIGNATURE__________________________________  ________________________________________________________________________

## 2017-02-01 NOTE — Progress Notes (Signed)
LOV 12-16-16 [place] on chart Stress test 12-25-16 epic ECHO 09-06-16 epic EKG 09-05-16 epic

## 2017-02-02 ENCOUNTER — Ambulatory Visit (HOSPITAL_COMMUNITY)
Admission: RE | Admit: 2017-02-02 | Discharge: 2017-02-02 | Disposition: A | Discharge status: Home / Self Care | Payer: Medicare Other | Source: Ambulatory Visit | Attending: Interventional Radiology | Admitting: Interventional Radiology

## 2017-02-02 ENCOUNTER — Encounter (HOSPITAL_COMMUNITY): Payer: Self-pay

## 2017-02-02 ENCOUNTER — Encounter (HOSPITAL_COMMUNITY)
Admission: RE | Admit: 2017-02-02 | Discharge: 2017-02-02 | Disposition: A | Discharge status: Home / Self Care | Payer: Medicare Other | Source: Ambulatory Visit | Attending: Interventional Radiology | Admitting: Interventional Radiology

## 2017-02-02 DIAGNOSIS — Z01818 Encounter for other preprocedural examination: Secondary | ICD-10-CM

## 2017-02-02 DIAGNOSIS — J9621 Acute and chronic respiratory failure with hypoxia: Secondary | ICD-10-CM | POA: Diagnosis not present

## 2017-02-02 DIAGNOSIS — J439 Emphysema, unspecified: Secondary | ICD-10-CM | POA: Diagnosis not present

## 2017-02-02 DIAGNOSIS — Z01812 Encounter for preprocedural laboratory examination: Secondary | ICD-10-CM | POA: Insufficient documentation

## 2017-02-02 DIAGNOSIS — N2889 Other specified disorders of kidney and ureter: Secondary | ICD-10-CM | POA: Diagnosis not present

## 2017-02-02 DIAGNOSIS — J9811 Atelectasis: Secondary | ICD-10-CM | POA: Diagnosis not present

## 2017-02-02 DIAGNOSIS — J449 Chronic obstructive pulmonary disease, unspecified: Secondary | ICD-10-CM | POA: Diagnosis not present

## 2017-02-02 LAB — CBC WITH DIFFERENTIAL/PLATELET
BASOS ABS: 0 10*3/uL (ref 0.0–0.1)
BASOS PCT: 0 %
Eosinophils Absolute: 0.3 10*3/uL (ref 0.0–0.7)
Eosinophils Relative: 4 %
HEMATOCRIT: 35.8 % — AB (ref 36.0–46.0)
HEMOGLOBIN: 11.3 g/dL — AB (ref 12.0–15.0)
LYMPHS PCT: 23 %
Lymphs Abs: 1.8 10*3/uL (ref 0.7–4.0)
MCH: 27.1 pg (ref 26.0–34.0)
MCHC: 31.6 g/dL (ref 30.0–36.0)
MCV: 85.9 fL (ref 78.0–100.0)
Monocytes Absolute: 0.4 10*3/uL (ref 0.1–1.0)
Monocytes Relative: 6 %
NEUTROS ABS: 5.3 10*3/uL (ref 1.7–7.7)
NEUTROS PCT: 67 %
Platelets: 390 10*3/uL (ref 150–400)
RBC: 4.17 MIL/uL (ref 3.87–5.11)
RDW: 14.4 % (ref 11.5–15.5)
WBC: 7.8 10*3/uL (ref 4.0–10.5)

## 2017-02-02 LAB — COMPREHENSIVE METABOLIC PANEL
ALBUMIN: 4.7 g/dL (ref 3.5–5.0)
ALK PHOS: 62 U/L (ref 38–126)
ALT: 15 U/L (ref 14–54)
AST: 26 U/L (ref 15–41)
Anion gap: 8 (ref 5–15)
BILIRUBIN TOTAL: 0.5 mg/dL (ref 0.3–1.2)
BUN: 29 mg/dL — AB (ref 6–20)
CO2: 24 mmol/L (ref 22–32)
CREATININE: 1.59 mg/dL — AB (ref 0.44–1.00)
Calcium: 10.8 mg/dL — ABNORMAL HIGH (ref 8.9–10.3)
Chloride: 105 mmol/L (ref 101–111)
GFR calc Af Amer: 38 mL/min — ABNORMAL LOW (ref >60)
GFR calc non Af Amer: 33 mL/min — ABNORMAL LOW (ref >60)
GLUCOSE: 90 mg/dL (ref 65–99)
POTASSIUM: 5.3 mmol/L — AB (ref 3.5–5.1)
Sodium: 137 mmol/L (ref 135–145)
TOTAL PROTEIN: 8.5 g/dL — AB (ref 6.5–8.1)

## 2017-02-02 LAB — PROTIME-INR
INR: 0.91
Prothrombin Time: 12.3 seconds (ref 11.4–15.2)

## 2017-02-02 LAB — GLUCOSE, CAPILLARY: Glucose-Capillary: 138 mg/dL — ABNORMAL HIGH (ref 65–99)

## 2017-02-02 NOTE — Progress Notes (Signed)
Spoke to Dr Kalman Shan of anesthesia to consult for surgery. Patient has had a similar a surgery and thus Dr Kalman Shan has given her the ok for surgery.

## 2017-02-03 LAB — HEMOGLOBIN A1C
Hgb A1c MFr Bld: 5.4 % (ref 4.8–5.6)
Mean Plasma Glucose: 108

## 2017-02-03 NOTE — Progress Notes (Signed)
CMP results routed via epic to Dr Corrie Mckusick in basket

## 2017-02-04 ENCOUNTER — Other Ambulatory Visit: Payer: Self-pay | Admitting: Student

## 2017-02-04 NOTE — Anesthesia Preprocedure Evaluation (Addendum)
Anesthesia Evaluation  Patient identified by MRN, date of birth, ID band Patient awake    Reviewed: Allergy & Precautions, H&P , NPO status , Patient's Chart, lab work & pertinent test results  Airway Mallampati: III  TM Distance: >3 FB Neck ROM: Full    Dental no notable dental hx. (+) Edentulous Upper, Edentulous Lower, Dental Advisory Given   Pulmonary COPD,  COPD inhaler and oxygen dependent, former smoker,    Pulmonary exam normal breath sounds clear to auscultation       Cardiovascular Exercise Tolerance: Good hypertension, On Medications  Rhythm:Regular Rate:Normal     Neuro/Psych Depression Bipolar Disorder Schizophrenia negative neurological ROS  negative psych ROS   GI/Hepatic Neg liver ROS, GERD  Medicated and Controlled,  Endo/Other  diabetes, Type 2, Oral Hypoglycemic Agents  Renal/GU Renal disease  negative genitourinary   Musculoskeletal  (+) Arthritis ,   Abdominal   Peds  Hematology negative hematology ROS (+)   Anesthesia Other Findings   Reproductive/Obstetrics negative OB ROS                            Anesthesia Physical Anesthesia Plan  ASA: III  Anesthesia Plan: General   Post-op Pain Management:    Induction: Intravenous  Airway Management Planned: Oral ETT  Additional Equipment:   Intra-op Plan:   Post-operative Plan: Extubation in OR  Informed Consent: I have reviewed the patients History and Physical, chart, labs and discussed the procedure including the risks, benefits and alternatives for the proposed anesthesia with the patient or authorized representative who has indicated his/her understanding and acceptance.   Dental advisory given  Plan Discussed with: CRNA  Anesthesia Plan Comments:         Anesthesia Quick Evaluation

## 2017-02-05 ENCOUNTER — Ambulatory Visit (HOSPITAL_COMMUNITY)
Admission: RE | Admit: 2017-02-05 | Discharge: 2017-02-06 | Disposition: A | Discharge status: Home / Self Care | Payer: Medicare Other | Source: Ambulatory Visit | Attending: Interventional Radiology | Admitting: Interventional Radiology

## 2017-02-05 ENCOUNTER — Ambulatory Visit (HOSPITAL_COMMUNITY)
Admission: RE | Admit: 2017-02-05 | Discharge: 2017-02-05 | Disposition: A | Discharge status: Home / Self Care | Payer: Medicare Other | Source: Ambulatory Visit | Attending: Interventional Radiology | Admitting: Interventional Radiology

## 2017-02-05 ENCOUNTER — Ambulatory Visit (HOSPITAL_COMMUNITY): Payer: Medicare Other | Admitting: Anesthesiology

## 2017-02-05 ENCOUNTER — Encounter (HOSPITAL_COMMUNITY)
Admission: RE | Disposition: A | Discharge status: Home / Self Care | Payer: Self-pay | Source: Ambulatory Visit | Attending: Interventional Radiology

## 2017-02-05 ENCOUNTER — Encounter (HOSPITAL_COMMUNITY): Payer: Self-pay

## 2017-02-05 ENCOUNTER — Encounter (HOSPITAL_COMMUNITY): Payer: Self-pay | Admitting: Certified Registered"

## 2017-02-05 DIAGNOSIS — J449 Chronic obstructive pulmonary disease, unspecified: Secondary | ICD-10-CM | POA: Insufficient documentation

## 2017-02-05 DIAGNOSIS — E785 Hyperlipidemia, unspecified: Secondary | ICD-10-CM | POA: Diagnosis not present

## 2017-02-05 DIAGNOSIS — K219 Gastro-esophageal reflux disease without esophagitis: Secondary | ICD-10-CM | POA: Insufficient documentation

## 2017-02-05 DIAGNOSIS — Z87891 Personal history of nicotine dependence: Secondary | ICD-10-CM | POA: Diagnosis not present

## 2017-02-05 DIAGNOSIS — Z79899 Other long term (current) drug therapy: Secondary | ICD-10-CM | POA: Diagnosis not present

## 2017-02-05 DIAGNOSIS — E119 Type 2 diabetes mellitus without complications: Secondary | ICD-10-CM | POA: Insufficient documentation

## 2017-02-05 DIAGNOSIS — C642 Malignant neoplasm of left kidney, except renal pelvis: Secondary | ICD-10-CM | POA: Diagnosis present

## 2017-02-05 DIAGNOSIS — Z9981 Dependence on supplemental oxygen: Secondary | ICD-10-CM | POA: Diagnosis not present

## 2017-02-05 DIAGNOSIS — F319 Bipolar disorder, unspecified: Secondary | ICD-10-CM | POA: Diagnosis not present

## 2017-02-05 DIAGNOSIS — Z7984 Long term (current) use of oral hypoglycemic drugs: Secondary | ICD-10-CM | POA: Insufficient documentation

## 2017-02-05 DIAGNOSIS — F209 Schizophrenia, unspecified: Secondary | ICD-10-CM | POA: Insufficient documentation

## 2017-02-05 DIAGNOSIS — I10 Essential (primary) hypertension: Secondary | ICD-10-CM | POA: Diagnosis not present

## 2017-02-05 DIAGNOSIS — H409 Unspecified glaucoma: Secondary | ICD-10-CM | POA: Diagnosis not present

## 2017-02-05 HISTORY — PX: RADIOFREQUENCY ABLATION: SHX2290

## 2017-02-05 LAB — GLUCOSE, CAPILLARY
GLUCOSE-CAPILLARY: 67 mg/dL (ref 65–99)
GLUCOSE-CAPILLARY: 81 mg/dL (ref 65–99)
Glucose-Capillary: 147 mg/dL — ABNORMAL HIGH (ref 65–99)
Glucose-Capillary: 125 mg/dL — ABNORMAL HIGH (ref 65–99)
Glucose-Capillary: 112 mg/dL — ABNORMAL HIGH (ref 65–99)

## 2017-02-05 LAB — TYPE AND SCREEN
ABO/RH(D): O POS
ANTIBODY SCREEN: NEGATIVE

## 2017-02-05 SURGERY — RADIO FREQUENCY ABLATION
Anesthesia: General | Laterality: Left

## 2017-02-05 MED ORDER — FENTANYL CITRATE (PF) 100 MCG/2ML IJ SOLN
INTRAMUSCULAR | Status: AC
  Filled 2017-02-05: qty 4

## 2017-02-05 MED ORDER — IBUPROFEN 100 MG PO CHEW: 400.0000 mg | CHEWABLE_TABLET | Freq: Four times a day (QID) | ORAL | Status: DC | PRN

## 2017-02-05 MED ORDER — PHENYLEPHRINE 40 MCG/ML (10ML) SYRINGE FOR IV PUSH (FOR BLOOD PRESSURE SUPPORT)
PREFILLED_SYRINGE | INTRAVENOUS | Status: DC | PRN
  Administered 2017-02-05 (×4): 80 ug via INTRAVENOUS

## 2017-02-05 MED ORDER — ACETAMINOPHEN 325 MG PO TABS
650.0000 mg | ORAL_TABLET | ORAL | Status: DC | PRN
  Administered 2017-02-05: 650 mg via ORAL
  Filled 2017-02-05: qty 2

## 2017-02-05 MED ORDER — ALBUTEROL SULFATE (2.5 MG/3ML) 0.083% IN NEBU
2.5000 mg | INHALATION_SOLUTION | Freq: Four times a day (QID) | RESPIRATORY_TRACT | Status: DC | PRN
  Administered 2017-02-05: 2.5 mg via RESPIRATORY_TRACT

## 2017-02-05 MED ORDER — DOCUSATE SODIUM 100 MG PO CAPS
100.0000 mg | ORAL_CAPSULE | Freq: Two times a day (BID) | ORAL | Status: DC
  Filled 2017-02-05: qty 1

## 2017-02-05 MED ORDER — SODIUM CHLORIDE 0.9 % IV SOLN
INTRAVENOUS | Status: AC
  Administered 2017-02-05: 18:00:00 via INTRAVENOUS

## 2017-02-05 MED ORDER — PROPOFOL 10 MG/ML IV BOLUS
INTRAVENOUS | Status: DC | PRN
  Administered 2017-02-05: 160 mg via INTRAVENOUS

## 2017-02-05 MED ORDER — DEXTROSE 50 % IV SOLN
25.0000 mL | Freq: Once | INTRAVENOUS | Status: AC
  Administered 2017-02-05: 25 mL via INTRAVENOUS

## 2017-02-05 MED ORDER — SUGAMMADEX SODIUM 200 MG/2ML IV SOLN
INTRAVENOUS | Status: DC | PRN
  Administered 2017-02-05: 200 mg via INTRAVENOUS

## 2017-02-05 MED ORDER — PROPOFOL 10 MG/ML IV BOLUS
INTRAVENOUS | Status: AC
  Filled 2017-02-05: qty 20

## 2017-02-05 MED ORDER — CEFAZOLIN SODIUM-DEXTROSE 2-4 GM/100ML-% IV SOLN
2.0000 g | INTRAVENOUS | Status: AC
  Administered 2017-02-05: 2 g via INTRAVENOUS

## 2017-02-05 MED ORDER — ROCURONIUM BROMIDE 10 MG/ML (PF) SYRINGE
PREFILLED_SYRINGE | INTRAVENOUS | Status: DC | PRN
  Administered 2017-02-05 (×2): 10 mg via INTRAVENOUS
  Administered 2017-02-05: 40 mg via INTRAVENOUS

## 2017-02-05 MED ORDER — SODIUM CHLORIDE 0.9 % IV SOLN
INTRAVENOUS | Status: AC
  Filled 2017-02-05: qty 250

## 2017-02-05 MED ORDER — SENNOSIDES-DOCUSATE SODIUM 8.6-50 MG PO TABS: 1.0000 | ORAL_TABLET | Freq: Every day | ORAL | Status: DC | PRN

## 2017-02-05 MED ORDER — IOPAMIDOL (ISOVUE-300) INJECTION 61%
INTRAVENOUS | Status: AC
  Filled 2017-02-05: qty 30

## 2017-02-05 MED ORDER — ONDANSETRON HCL 4 MG/2ML IJ SOLN: 4.0000 mg | Freq: Four times a day (QID) | INTRAMUSCULAR | Status: DC | PRN

## 2017-02-05 MED ORDER — DEXTROSE 50 % IV SOLN
INTRAVENOUS | Status: AC
  Filled 2017-02-05: qty 50

## 2017-02-05 MED ORDER — IOPAMIDOL (ISOVUE-300) INJECTION 61%
75.0000 mL | Freq: Once | INTRAVENOUS | Status: AC | PRN
  Administered 2017-02-05: 75 mL via INTRAVENOUS

## 2017-02-05 MED ORDER — LACTATED RINGERS IV SOLN
INTRAVENOUS | Status: DC | PRN
  Administered 2017-02-05 (×2): via INTRAVENOUS

## 2017-02-05 MED ORDER — IOPAMIDOL (ISOVUE-300) INJECTION 61%
INTRAVENOUS | Status: AC
  Filled 2017-02-05: qty 75

## 2017-02-05 MED ORDER — CEFAZOLIN SODIUM-DEXTROSE 2-4 GM/100ML-% IV SOLN
INTRAVENOUS | Status: AC
  Filled 2017-02-05: qty 100

## 2017-02-05 MED ORDER — ONDANSETRON HCL 4 MG/2ML IJ SOLN: 4.0000 mg | Freq: Three times a day (TID) | INTRAMUSCULAR | Status: DC | PRN

## 2017-02-05 MED ORDER — LIDOCAINE 2% (20 MG/ML) 5 ML SYRINGE
INTRAMUSCULAR | Status: DC | PRN
  Administered 2017-02-05: 100 mg via INTRAVENOUS

## 2017-02-05 MED ORDER — ONDANSETRON HCL 4 MG/2ML IJ SOLN
INTRAMUSCULAR | Status: DC | PRN
Start: 2017-02-05 — End: 2017-02-05
  Administered 2017-02-05: 4 mg via INTRAVENOUS

## 2017-02-05 MED ORDER — SODIUM CHLORIDE 0.9 % IV SOLN
INTRAVENOUS | Status: DC
  Administered 2017-02-05: 15:00:00 via INTRAVENOUS

## 2017-02-05 MED ORDER — FENTANYL CITRATE (PF) 100 MCG/2ML IJ SOLN
INTRAMUSCULAR | Status: DC | PRN
Start: 2017-02-05 — End: 2017-02-05
  Administered 2017-02-05 (×3): 50 ug via INTRAVENOUS

## 2017-02-05 MED ORDER — ACETAMINOPHEN 80 MG PO CHEW
320.0000 mg | CHEWABLE_TABLET | Freq: Four times a day (QID) | ORAL | Status: DC | PRN
  Filled 2017-02-05: qty 4

## 2017-02-05 MED ORDER — ALBUTEROL SULFATE (2.5 MG/3ML) 0.083% IN NEBU
INHALATION_SOLUTION | RESPIRATORY_TRACT | Status: AC
  Filled 2017-02-05: qty 3

## 2017-02-05 NOTE — H&P (Signed)
Chief Complaint: Patient was seen in consultation today for cryoablation of left renal cell carcinoma at the request of [place]  Referring Physician(s): Corrie Mckusick  Supervising Physician: Corrie Mckusick  Patient Status: [facility] - Out-pt  History of Present Illness: Elizabeth Thompson is a 66 y.o. female with hx of (L)RCC. She underwent cryoablation last year without complication. She has had routine follow up, but unfortunately, a second mass within the left kidney has now shown up, also suspicious for RCC. She has discussed options with Dr. Earleen Newport and is now scheduled for another CT guided cryoablation of the left renal mass. Underlying medical hx of COPD, remains on home O2 and is compliant with her home meds and inhalers. She denies, CP, SOB, fevers, chills, recent illness. No abd pains, diarrhea, dysuria. She has been NPO this am.   Past Medical History:  Diagnosis Date  . Arthritis   . Back pain   . Bell's palsy   . Bipolar disorder ([place])   . Bronchitis   . Chronic low back pain 05/09/2015  . COPD (chronic obstructive pulmonary disease) ([place])    hyoxia during sleep- using O2 as needed  . Cough with expectoration 05/21/16   recent diagnosis of bronchitis  . Cyst of right kidney   . Diabetes mellitus without complication ([place])    type 2  . Frequency of urination   . GERD (gastroesophageal reflux disease)   . Glaucoma   . History of blood transfusion   . History of colon polyps   . History of hiatal hernia   . History of tobacco use   . Hypercalcemia   . Hyperlipidemia   . Hypertension   . Memory disorder 09/05/2014  . On home oxygen therapy    3L/M  at night   . Schizo-affective psychosis ([place])   . Shortness of breath dyspnea    exertion, or with out oxygen  . Uterine fibroid     Past Surgical History:  Procedure Laterality Date  . ABDOMINAL HYSTERECTOMY    . APPENDECTOMY    . COLONOSCOPY W/ POLYPECTOMY    . IR GENERIC HISTORICAL  04/29/2016   IR  RADIOLOGIST EVAL & MGMT 04/29/2016 Corrie Mckusick, DO [facility] INTERV RAD  . IR GENERIC HISTORICAL  10/01/2016   IR RADIOLOGIST EVAL & MGMT 10/01/2016 [facility] INTERV RAD  . IR GENERIC HISTORICAL  11/11/2016   IR RADIOLOGIST EVAL & MGMT 11/11/2016 Corrie Mckusick, DO [facility] INTERV RAD  . IR GENERIC HISTORICAL  07/16/2016   IR RADIOLOGIST EVAL & MGMT 07/16/2016 Corrie Mckusick, DO [facility] INTERV RAD  . LUMBAR LAMINECTOMY/DECOMPRESSION MICRODISCECTOMY N/A 08/14/2015   Procedure: LUMBAR DECOMPRESSION MICRODISCECTOMY L3-S1;  Surgeon: Melina Schools, MD;  Location: [place];  Service: Orthopedics;  Laterality: N/A;  . TONSILLECTOMY AND ADENOIDECTOMY      Allergies: Codeine and Prednisone  Medications: Prior to Admission medications   Medication Sig Start Date End Date Taking? Authorizing Provider  acetaminophen (TYLENOL) 500 MG tablet Take 500 mg by mouth 2 (two) times daily as needed (for pain.).    Historical Provider, MD  albuterol (PROVENTIL HFA;VENTOLIN HFA) 108 (90 BASE) MCG/ACT inhaler Inhale 2 puffs into the lungs every 6 (six) hours as needed for wheezing or shortness of breath. 08/27/14   Kerrie Buffalo, NP  albuterol (PROVENTIL) (2.5 MG/3ML) 0.083% nebulizer solution USE 1 VIAL IN NEBULIZER THREE TIMES DAILY AS NEEDED Patient taking differently: USE 1 VIAL IN NEBULIZER THREE TIMES DAILY AS NEEDED WHEEZING OR SHORTNESS OF BREATH. 11/18/16   Rollene Fare  Barnabas Harries, NP  amLODipine (NORVASC) 10 MG tablet Take 5 mg by mouth at bedtime.    Historical Provider, MD  ARIPiprazole (ABILIFY) 20 MG tablet Take 1 tablet (20 mg total) by mouth at bedtime. 08/27/14   Kerrie Buffalo, NP  atorvastatin (LIPITOR) 20 MG tablet Take 20 mg by mouth daily after supper.    Historical Provider, MD  benazepril (LOTENSIN) 5 MG tablet Take 5 mg by mouth at bedtime.    Historical Provider, MD  bisacodyl (DULCOLAX) 10 MG suppository Place 1 suppository (10 mg total) rectally daily as needed for moderate constipation. Patient taking differently:  Place 10 mg rectally daily as needed for moderate constipation. For constipation. 09/08/16   Geradine Girt, DO  budesonide-formoterol (SYMBICORT) 160-4.5 MCG/ACT inhaler Inhale 2 puffs into the lungs 2 (two) times daily.    Historical Provider, MD  Cholecalciferol (VITAMIN D3 PO) Take 8,000 Units by mouth every other day.    Historical Provider, MD  donepezil (ARICEPT) 5 MG tablet Take 1 tablet (5 mg total) by mouth at bedtime. 08/27/14   Kerrie Buffalo, NP  dorzolamide-timolol (COSOPT) 22.3-6.8 MG/ML ophthalmic solution Place 1 drop into both eyes 2 (two) times daily.    Historical Provider, MD  esomeprazole (NEXIUM) 20 MG capsule Take 20 mg by mouth daily as needed (For heartburn or acid reflux.).     Historical Provider, MD  glimepiride (AMARYL) 4 MG tablet Take 1 tablet (4 mg total) by mouth daily with breakfast. 08/27/14   Kerrie Buffalo, NP  glucose blood (ACCU-CHEK AVIVA) test strip 1 each by Other route 2 (two) times daily. 08/04/16   Jearld Fenton, NP  hydrOXYzine (ATARAX/VISTARIL) 25 MG tablet Take 50 mg by mouth at bedtime.  04/17/16   Historical Provider, MD  Ipratropium-Albuterol (COMBIVENT) 20-100 MCG/ACT AERS respimat Inhale 1 puff into the lungs every 6 (six) hours.    Historical Provider, MD  irbesartan (AVAPRO) 300 MG tablet Take 1 tablet (300 mg total) by mouth daily. 12/05/15   Tanda Rockers, MD  lactulose (CHRONULAC) 10 GM/15ML solution Take 30 mLs (20 g total) by mouth 2 (two) times daily as needed for mild constipation. 09/08/16   Geradine Girt, DO  lamoTRIgine (LAMICTAL) 100 MG tablet Take 1 tablet (100 mg total) by mouth every evening. Patient taking differently: Take 100 mg by mouth daily after supper.  08/27/14   Kerrie Buffalo, NP  latanoprost (XALATAN) 0.005 % ophthalmic solution Place 1 drop into both eyes at bedtime. 08/27/14   Kerrie Buffalo, NP  metFORMIN (GLUCOPHAGE) 1000 MG tablet Take 1 tablet (1,000 mg total) by mouth 2 (two) times daily with a meal. 08/27/14   Kerrie Buffalo, NP  mirtazapine (REMERON) 7.5 MG tablet Take 1 tablet (7.5 mg total) by mouth at bedtime. 08/27/14   Kerrie Buffalo, NP  Multiple Vitamin (MULTIVITAMIN WITH MINERALS) TABS tablet Take 1 tablet by mouth daily.    Historical Provider, MD  ondansetron (ZOFRAN) 4 MG tablet Take 1 tablet (4 mg total) by mouth every 8 (eight) hours as needed for nausea or vomiting. 08/14/15   Melina Schools, MD  OXYGEN Inhale 3-4 L into the lungs continuous. 3 L  CONTINOUSLY & 4 L IF PATIENT IS AMBULARO    Historical Provider, MD  polyethylene glycol (MIRALAX / GLYCOLAX) packet Take 17 g by mouth daily. Patient taking differently: Take 17 g by mouth daily as needed (for constipation.).  09/08/16   Geradine Girt, DO  Selenium Sulf-Pyrithione-Urea 2.25 % SHAM Apply  1 application topically every 14 (fourteen) days. Applied to scalp for dermatitis. 01/25/15   Historical Provider, MD  senna (SENOKOT) 8.6 MG tablet Take 1 tablet by mouth daily as needed for constipation.     Historical Provider, MD     Family History  Problem Relation Age of Onset  . Dementia Mother   . Alzheimer's disease Mother   . Heart disease Mother   . Hypertension Mother   . Diabetes Mother   . Diabetes Father   . Alzheimer's disease Sister   . Heart disease Brother   . Heart attack Brother   . Bladder Cancer Neg Hx   . Kidney cancer Neg Hx   . Prostate cancer Neg Hx     Social History   Social History  . Marital status: Divorced    Spouse name: N/A  . Number of children: 1  . Years of education: college 4   Occupational History  . disabled    Social History Main Topics  . Smoking status: Former Smoker    Packs/day: 1.00    Years: 28.00    Types: Cigarettes    Quit date: 07/15/2015  . Smokeless tobacco: Never Used  . Alcohol use No  . Drug use: No  . Sexual activity: No   Other Topics Concern  . None   Social History Narrative   Patient is right handed.   Patient drinks 2 cups caffeine daily.     Review of  Systems: A 12 point ROS discussed and pertinent positives are indicated in the HPI above.  All other systems are negative.  Review of Systems  Vital Signs: There were no vitals taken for this visit.  Physical Exam  Constitutional: She is oriented to person, place, and time. No distress.  Obese AA female, NAD  HENT:  Head: Normocephalic.  Mouth/Throat: Oropharynx is clear and moist.  Neck: Normal range of motion. No JVD present. No tracheal deviation present.  Cardiovascular: Normal rate, regular rhythm and normal heart sounds.   Pulmonary/Chest: Effort normal and breath sounds normal. No respiratory distress.  Abdominal: Soft. She exhibits no distension and no mass. There is no tenderness.  Neurological: She is alert and oriented to person, place, and time.  Skin: Skin is warm and dry.  Psychiatric: She has a normal mood and affect. Judgment normal.  Vitals reviewed.    Imaging: Dg Chest 2 View  Result Date: 02/02/2017 CLINICAL DATA:  Preop for renal cryo ablation and history of COPD EXAM: CHEST  2 VIEW COMPARISON:  CT chest of 11/10/2016 and chest x-ray of 09/04/2016 FINDINGS: There is slight under aeration of the lung bases with minimal basilar atelectasis. No pneumonia or effusion is seen. There are somewhat prominent interstitial markings which most likely are chronic in this patient with a history of emphysema by CT. Mediastinal and hilar contours are unremarkable. The heart is within normal limits in size. No bony abnormality is seen. IMPRESSION: Chronic emphysematous change.  No active lung disease. Electronically Signed   By: Ivar Drape M.D.   On: 02/02/2017 15:30    Labs:  CBC:  Recent Labs  06/23/16 1215 09/04/16 1241 09/06/16 0530 02/02/17 1449  WBC 6.8 7.4 6.0 7.8  HGB 12.2 11.9* 10.5* 11.3*  HCT 37.9 37.2 32.2* 35.8*  PLT 341 358 323 390    COAGS:  Recent Labs  05/21/16 1205 06/23/16 1215 02/02/17 1449  INR 0.92 0.98 0.91  APTT 37 35  --      BMP:  Recent Labs  09/04/16 1241 09/05/16 1030 09/06/16 0530 02/02/17 1449  NA 140 140 141 137  K 5.3* 5.1 4.9 5.3*  CL 111 110 114* 105  CO2 21* 24 20* 24  GLUCOSE 114* 156* 106* 90  BUN 33* 25* 24* 29*  CALCIUM 10.4* 9.8 9.5 10.8*  CREATININE 1.63* 1.57* 1.53* 1.59*  GFRNONAA 32* 33* 34* 33*  GFRAA 37* 39* 40* 38*    LIVER FUNCTION TESTS:  Recent Labs  03/19/16 1129 09/05/16 1030 02/02/17 1449  BILITOT 0.3 0.6 0.5  AST _0 ALT 12 12* 15  ALKPHOS 75 60 62  PROT 7.7 7.7 8.5*  ALBUMIN 4.4 3.8 4.7    TUMOR MARKERS: No results for input(s): AFPTM, CEA, CA199, CHROMGRNA in the last 8760 hours.  Assessment and Plan: Left renal mass, hx of RCC For CT guided cryoablation Labs ok Risks and Benefits discussed with the patient including, but not limited to failure to treat entire lesion, bleeding, infection, damage to adjacent structures, decrease in renal function or post procedural neuropathy. All of the patient's questions were answered, patient is agreeable to proceed. Consent signed and in chart.    Thank you for this interesting consult.  I greatly enjoyed meeting Elizabeth Thompson and look forward to participating in their care.  A copy of this report was sent to the requesting provider on this date.  Electronically Signed: Ascencion Dike 02/05/2017, 8:19 AM   I spent a total of 20 minutes in face to face in clinical consultation, greater than 50% of which was counseling/coordinating care for renal cryoablation

## 2017-02-05 NOTE — Transfer of Care (Signed)
Immediate Anesthesia Transfer of Care Note  Patient: Elizabeth Thompson  Procedure(s) Performed: Procedure(s): LEFT RENAL CRYOABLATION (Left)  Patient Location: PACU  Anesthesia Type:General  Level of Consciousness: awake, alert  and oriented  Airway & Oxygen Therapy: Patient Spontanous Breathing and Patient connected to face mask oxygen  Post-op Assessment: Report given to RN and Post -op Vital signs reviewed and stable  Post vital signs: Reviewed and stable  Last Vitals:  Vitals:   02/05/17 0623  BP: (!) 152/95  Pulse: 91  Resp: 18  Temp: 37.3 C    Last Pain:  Vitals:   02/05/17 0623  TempSrc: Oral         Complications: No apparent anesthesia complications

## 2017-02-05 NOTE — Procedures (Signed)
Interventional Radiology Procedure Note  Procedure: CT guided cryoablation of left renal tumor, presumably RCC.    Single Galil IceForce probe.    Hydrodissection for pancreas protection  Anesthesia: GETA  EBL: None  Specimen: None  Complications: None  Recommendations:  - To PACU for recovery - Sterile bandage to site. Remove after 24 hours. Routine wound care - Overnight observation med/surg bed - Restart Home meds - Bedrest x 6 hours - May remove foley catheter tonight - IV hydration, 100cc/hour x 8 hours - Advance diet to heart healthy - Do not submerge for 7 days - Follow up with Dr. Earleen Newport in 4-6 weeks in clinic. - Repeat CT scan in 3-6 months.   Signed,  Dulcy Fanny. Earleen Newport, DO

## 2017-02-05 NOTE — Anesthesia Postprocedure Evaluation (Signed)
Anesthesia Post Note  Patient: Elizabeth Thompson  Procedure(s) Performed: Procedure(s) (LRB): LEFT RENAL CRYOABLATION (Left)  Patient location during evaluation: PACU Anesthesia Type: General Level of consciousness: awake and alert Pain management: pain level controlled Vital Signs Assessment: post-procedure vital signs reviewed and stable Respiratory status: spontaneous breathing, nonlabored ventilation, respiratory function stable and patient connected to nasal cannula oxygen Cardiovascular status: blood pressure returned to baseline and stable Postop Assessment: no signs of nausea or vomiting Anesthetic complications: no       Last Vitals:  Vitals:   02/05/17 1300 02/05/17 1315  BP: 94/72 127/75  Pulse: 73   Resp: (!) 26   Temp:      Last Pain:  Vitals:   02/05/17 1300  TempSrc:   PainSc: 0-No pain                 Radonna Bracher,W. EDMOND

## 2017-02-05 NOTE — Progress Notes (Addendum)
Pt preop CBG 66.  Anesthesia consulted. States she had loose stools last night.  NPO after midnight and denies pain.

## 2017-02-05 NOTE — Anesthesia Procedure Notes (Signed)
Procedure Name: Intubation Date/Time: 02/05/2017 8:45 AM Performed by: Noralyn Pick D Pre-anesthesia Checklist: Patient identified, Emergency Drugs available, Suction available and Patient being monitored Patient Re-evaluated:Patient Re-evaluated prior to inductionOxygen Delivery Method: Circle system utilized Preoxygenation: Pre-oxygenation with 100% oxygen Intubation Type: IV induction Ventilation: Mask ventilation without difficulty Laryngoscope Size: Mac and 3 Grade View: Grade I Tube type: Oral Number of attempts: 1 Airway Equipment and Method: Stylet Placement Confirmation: ETT inserted through vocal cords under direct vision,  positive ETCO2 and breath sounds checked- equal and bilateral Secured at: 22 cm Tube secured with: Tape Dental Injury: Teeth and Oropharynx as per pre-operative assessment

## 2017-02-06 DIAGNOSIS — E785 Hyperlipidemia, unspecified: Secondary | ICD-10-CM | POA: Diagnosis not present

## 2017-02-06 DIAGNOSIS — J449 Chronic obstructive pulmonary disease, unspecified: Secondary | ICD-10-CM | POA: Diagnosis not present

## 2017-02-06 DIAGNOSIS — Z79899 Other long term (current) drug therapy: Secondary | ICD-10-CM | POA: Diagnosis not present

## 2017-02-06 DIAGNOSIS — H409 Unspecified glaucoma: Secondary | ICD-10-CM | POA: Diagnosis not present

## 2017-02-06 DIAGNOSIS — I10 Essential (primary) hypertension: Secondary | ICD-10-CM | POA: Diagnosis not present

## 2017-02-06 DIAGNOSIS — C642 Malignant neoplasm of left kidney, except renal pelvis: Secondary | ICD-10-CM | POA: Diagnosis not present

## 2017-02-06 DIAGNOSIS — E119 Type 2 diabetes mellitus without complications: Secondary | ICD-10-CM | POA: Diagnosis not present

## 2017-02-06 DIAGNOSIS — Z7984 Long term (current) use of oral hypoglycemic drugs: Secondary | ICD-10-CM | POA: Diagnosis not present

## 2017-02-06 DIAGNOSIS — Z87891 Personal history of nicotine dependence: Secondary | ICD-10-CM | POA: Diagnosis not present

## 2017-02-06 DIAGNOSIS — Z9981 Dependence on supplemental oxygen: Secondary | ICD-10-CM | POA: Diagnosis not present

## 2017-02-06 DIAGNOSIS — K219 Gastro-esophageal reflux disease without esophagitis: Secondary | ICD-10-CM | POA: Diagnosis not present

## 2017-02-06 NOTE — Discharge Summary (Signed)
Patient ID: Elizabeth Thompson MRN: [id number] DOB/AGE: [birth year] 67 y.o.  Admit date: 02/05/2017 Discharge date: 02/06/2017  Supervising Physician: Daryll Brod  Patient Status: [facility] - In-pt  Admission Diagnoses: Left renal cell carcinoma  Discharge Diagnoses:  Active Problems:   Renal cell carcinoma, left [facility])   Discharged Condition: good  Hospital Course:  Elizabeth Thompson is a 66 y.o. female with hx of (L)RCC. She underwent cryoablation last year without complication. At routine follow up a second mass within the left kidney has now shown up, also suspicious for RCC.  Patient presented to [facility] for CT-guided cryoablation of the left kidney lesion.  Patient underwent procedure with Dr. Earleen Newport using general anesthesia 02/05/17. Procedure was without immediate complications and was well tolerated by the patient.  After procedure, she returned to unit with foley catheter in place for recovery.  She has been able to advance her diet with tolerance.  Foley was removed overnight and she has been urinating without difficulty.  Urine was initially blood-tinged, but has since cleared. Her pain has been effectively managed and she will use OTC medications at home as needed for ongoing pain/soreness control. She has been ambulating without difficulty.  She is stable for discharge home today.  Consults: None  Treatments:  CT guided cryoablation of left renal tumor, presumably RCC.   Single Galil IceForce probe.   Hydrodissection for pancreas protection  Discharge Exam: Blood pressure (!) 113/50, pulse 79, temperature 98 F (36.7 C), temperature source Oral, resp. rate 16, height 5' 3.25" (1.607 m), weight 232 lb 9.6 oz (105.5 kg), SpO2 95 %. Alert, awake, resting comfortably in room Abd/Flank:  Non tender to palpation, dressing in place without evidence of bleeding, site c/d/i.  Disposition: 01-Home or Self Care   Allergies as of 02/06/2017      Reactions   Codeine Nausea And Vomiting     Prednisone Other (See Comments)   Has to monitor because she is a diabetic       Medication List    TAKE these medications   acetaminophen 500 MG tablet Commonly known as:  TYLENOL Take 500 mg by mouth 2 (two) times daily as needed (for pain.).   albuterol 108 (90 Base) MCG/ACT inhaler Commonly known as:  PROVENTIL HFA;VENTOLIN HFA Inhale 2 puffs into the lungs every 6 (six) hours as needed for wheezing or shortness of breath. What changed:  Another medication with the same name was changed. Make sure you understand how and when to take each.   albuterol (2.5 MG/3ML) 0.083% nebulizer solution Commonly known as:  PROVENTIL USE 1 VIAL IN NEBULIZER THREE TIMES DAILY AS NEEDED What changed:  See the new instructions.   amLODipine 10 MG tablet Commonly known as:  NORVASC Take 5 mg by mouth at bedtime.   ARIPiprazole 20 MG tablet Commonly known as:  ABILIFY Take 1 tablet (20 mg total) by mouth at bedtime.   atorvastatin 20 MG tablet Commonly known as:  LIPITOR Take 20 mg by mouth daily after supper.   benazepril 5 MG tablet Commonly known as:  LOTENSIN Take 5 mg by mouth at bedtime.   bisacodyl 10 MG suppository Commonly known as:  DULCOLAX Place 1 suppository (10 mg total) rectally daily as needed for moderate constipation. What changed:  additional instructions   budesonide-formoterol 160-4.5 MCG/ACT inhaler Commonly known as:  SYMBICORT Inhale 2 puffs into the lungs 2 (two) times daily.   donepezil 5 MG tablet Commonly known as:  ARICEPT Take 1 tablet (  5 mg total) by mouth at bedtime.   dorzolamide-timolol 22.3-6.8 MG/ML ophthalmic solution Commonly known as:  COSOPT Place 1 drop into both eyes 2 (two) times daily.   esomeprazole 20 MG capsule Commonly known as:  NEXIUM Take 20 mg by mouth daily as needed (For heartburn or acid reflux.).   glimepiride 4 MG tablet Commonly known as:  AMARYL Take 1 tablet (4 mg total) by mouth daily with breakfast.    glucose blood test strip Commonly known as:  ACCU-CHEK AVIVA 1 each by Other route 2 (two) times daily.   hydrOXYzine 25 MG tablet Commonly known as:  ATARAX/VISTARIL Take 50 mg by mouth at bedtime.   Ipratropium-Albuterol 20-100 MCG/ACT Aers respimat Commonly known as:  COMBIVENT Inhale 1 puff into the lungs every 6 (six) hours.   irbesartan 300 MG tablet Commonly known as:  AVAPRO Take 1 tablet (300 mg total) by mouth daily.   lactulose 10 GM/15ML solution Commonly known as:  CHRONULAC Take 30 mLs (20 g total) by mouth 2 (two) times daily as needed for mild constipation.   lamoTRIgine 100 MG tablet Commonly known as:  LAMICTAL Take 1 tablet (100 mg total) by mouth every evening. What changed:  when to take this   latanoprost 0.005 % ophthalmic solution Commonly known as:  XALATAN Place 1 drop into both eyes at bedtime.   metFORMIN 1000 MG tablet Commonly known as:  GLUCOPHAGE Take 1 tablet (1,000 mg total) by mouth 2 (two) times daily with a meal.   mirtazapine 7.5 MG tablet Commonly known as:  REMERON Take 1 tablet (7.5 mg total) by mouth at bedtime.   multivitamin with minerals Tabs tablet Take 1 tablet by mouth daily.   ondansetron 4 MG tablet Commonly known as:  ZOFRAN Take 1 tablet (4 mg total) by mouth every 8 (eight) hours as needed for nausea or vomiting.   OXYGEN Inhale 3-4 L into the lungs continuous. 3 L  CONTINOUSLY & 4 L IF PATIENT IS AMBULARO   polyethylene glycol packet Commonly known as:  MIRALAX / GLYCOLAX Take 17 g by mouth daily. What changed:  when to take this  reasons to take this   Selenium Sulf-Pyrithione-Urea 2.25 % Sham Apply 1 application topically every 14 (fourteen) days. Applied to scalp for dermatitis.   senna 8.6 MG tablet Commonly known as:  SENOKOT Take 1 tablet by mouth daily as needed for constipation.   VITAMIN D3 PO Take 8,000 Units by mouth every other day.         Electronically Signed: Docia Barrier 02/06/2017, 10:34 AM   I have spent Less Than 30 Minutes discharging [name].

## 2017-02-06 NOTE — Progress Notes (Signed)
Discharge instructions reviewed with patient and niece who is caregiver. Both verbalized understanding. Patient to be d/c via private vehicle.

## 2017-02-06 NOTE — Care Management Note (Signed)
Case Management Note  Patient Details  Name: Elizabeth Thompson MRN: [id number] Date of Birth: 09-12-1951  Subjective/Objective:  Renal cell carcinoma of left kidney                  Action/Plan: Discharge Planning: AVS reviewed: NCM spoke to pt's niece, [name]. States she assist patient in the home. Pt has home oxygen with [employer] and [employer] with seat. She did bring portable tank from home for dc. States pt does have a [employer]. She is interested in [employer] for her pt. Explained Pulmonologist can order from his office. She would have to arrange follow up appt.    PCP Everardo Beals MD   Expected Discharge Date:  02/06/17               Expected Discharge Plan:  Home/Self Care  In-House Referral:  NA  Discharge planning Services  CM Consult  Post Acute Care Choice:  NA Choice offered to:  NA  DME Arranged:  N/A DME Agency:  NA  HH Arranged:  NA HH Agency:  NA  Status of Service:  Completed, signed off  If discussed at [place] of Stay Meetings, dates discussed:    Additional Comments:  Erenest Rasher, RN 02/06/2017, 12:20 PM

## 2017-02-08 DIAGNOSIS — J449 Chronic obstructive pulmonary disease, unspecified: Secondary | ICD-10-CM | POA: Diagnosis not present

## 2017-02-08 DIAGNOSIS — J9621 Acute and chronic respiratory failure with hypoxia: Secondary | ICD-10-CM | POA: Diagnosis not present

## 2017-02-08 LAB — GLUCOSE, CAPILLARY: Glucose-Capillary: 90 mg/dL (ref 65–99)

## 2017-03-02 ENCOUNTER — Encounter: Payer: Self-pay | Admitting: Podiatry

## 2017-03-02 ENCOUNTER — Ambulatory Visit (INDEPENDENT_AMBULATORY_CARE_PROVIDER_SITE_OTHER): Payer: Medicare Other | Admitting: Podiatry

## 2017-03-02 DIAGNOSIS — M79676 Pain in unspecified toe(s): Secondary | ICD-10-CM

## 2017-03-02 DIAGNOSIS — B351 Tinea unguium: Secondary | ICD-10-CM | POA: Diagnosis not present

## 2017-03-02 DIAGNOSIS — J9621 Acute and chronic respiratory failure with hypoxia: Secondary | ICD-10-CM | POA: Diagnosis not present

## 2017-03-02 DIAGNOSIS — J449 Chronic obstructive pulmonary disease, unspecified: Secondary | ICD-10-CM | POA: Diagnosis not present

## 2017-03-02 NOTE — Progress Notes (Signed)
   Subjective:    Patient ID: Elizabeth Thompson, female    DOB: 01-24-1951, 66 y.o.   MRN: [id number]  HPI this patient presents to the office with chief complaint of painful long thick nails. Patient states the nails are painful as she walks and wears her shoes. Patient stayed states that she is a diabetic without any complications. She presents the office for preventive foot care services.  Patient has been treated for cancer in kidney.    Review of Systems  Constitutional: Positive for activity change.  HENT:       Sinus problems  Respiratory: Positive for cough.   Gastrointestinal: Positive for diarrhea.  Genitourinary: Positive for urgency.  Musculoskeletal: Positive for back pain and gait problem.  Skin:       Change in nails  Allergic/Immunologic: Positive for environmental allergies.  Neurological: Positive for tremors.       Objective:   Physical Exam GENERAL APPEARANCE: Alert, conversant. Appropriately groomed. No acute distress.  VASCULAR: Pedal pulses palpable at  [facility] and PT bilateral.  Capillary refill time is immediate to all digits,  Normal temperature gradient.  Digital hair growth is present bilateral  NEUROLOGIC: sensation is normal to 5.07 monofilament at 5/5 sites bilateral.  Light touch is intact bilateral, Muscle strength normal.  MUSCULOSKELETAL: acceptable muscle strength, tone and stability bilateral.  Intrinsic muscluature intact bilateral.  Rectus appearance of foot and digits noted bilateral.   DERMATOLOGIC: skin color, texture, and turgor are within normal limits.  No preulcerative lesions or ulcers  are seen, no interdigital maceration noted.  No open lesions present.  . No drainage noted.  NAILS  Thick disfigured discolored nails 2-5 B/L       Assessment & Plan:  Onychomycosis B/L   Diabetes with no complications.  IE  Debridement of nails B/L  RTC 3 months   Gardiner Barefoot DPM

## 2017-03-08 DIAGNOSIS — J449 Chronic obstructive pulmonary disease, unspecified: Secondary | ICD-10-CM | POA: Diagnosis not present

## 2017-03-08 DIAGNOSIS — J9621 Acute and chronic respiratory failure with hypoxia: Secondary | ICD-10-CM | POA: Diagnosis not present

## 2017-03-09 ENCOUNTER — Encounter (HOSPITAL_COMMUNITY): Payer: Self-pay | Admitting: Interventional Radiology

## 2017-03-10 ENCOUNTER — Ambulatory Visit
Admission: RE | Admit: 2017-03-10 | Discharge: 2017-03-10 | Disposition: A | Discharge status: Home / Self Care | Payer: Medicare Other | Source: Ambulatory Visit | Attending: Student | Admitting: Student

## 2017-03-10 DIAGNOSIS — C642 Malignant neoplasm of left kidney, except renal pelvis: Secondary | ICD-10-CM

## 2017-03-10 HISTORY — PX: IR RADIOLOGIST EVAL & MGMT: IMG5224

## 2017-03-10 NOTE — Progress Notes (Signed)
Chief Complaint: Left RCC  Referring Physician(s): Dr Hollice Espy, [facility] Dr Zola Button, Oncology  History of Present Illness: Elizabeth Thompson is a 66 y.o. female presenting today as a scheduled follow-up to vascular interventional radiology clinic, status post CT-guided ablation with cryotherapy of a second left-sided renal cell carcinoma.  Most recently, we treated a new left renal cell carcinoma (presumed) 02/05/2017. She was hospitalized overnight routinely for 23 hour observation and was discharged uneventfully the next day on 2/24.  Since then she has recovered quite well, with no complaints of ongoing pain, denies hematuria, and has no concerns for the puncture site on the left flank.  In fact most of her concerns are regarding her respiratory status, as she has some deconditioning related to her primary lung disease.  Her prior treatment was performed 06/26/2016. She recovered well at that time.  Today's discussion was mostly about her follow-up plan. We anticipate repeat imaging  Past Medical History:  Diagnosis Date  . Arthritis   . Back pain   . Bell's palsy   . Bipolar disorder ([place])   . Bronchitis   . Chronic low back pain 05/09/2015  . COPD (chronic obstructive pulmonary disease) ([place])    hyoxia during sleep- using O2 as needed  . Cough with expectoration 05/21/16   recent diagnosis of bronchitis  . Cyst of right kidney   . Diabetes mellitus without complication ([place])    type 2  . Frequency of urination   . GERD (gastroesophageal reflux disease)   . Glaucoma   . History of blood transfusion   . History of colon polyps   . History of hiatal hernia   . History of tobacco use   . Hypercalcemia   . Hyperlipidemia   . Hypertension   . Memory disorder 09/05/2014  . On home oxygen therapy    3L/M [place] at night   . Schizo-affective psychosis ([place])   . Shortness of breath dyspnea    exertion, or with out oxygen  . Uterine  fibroid     Past Surgical History:  Procedure Laterality Date  . ABDOMINAL HYSTERECTOMY    . APPENDECTOMY    . COLONOSCOPY W/ POLYPECTOMY    . IR GENERIC HISTORICAL  04/29/2016   IR RADIOLOGIST EVAL & MGMT 04/29/2016 Corrie Mckusick, DO [facility] INTER[facility]  . IR GENERIC HISTORICAL  10/01/2016   IR RADIOLOGIST EVAL & MGMT 10/01/2016 [facility]  . IR GENERIC HISTORICAL  11/11/2016   IR RADIOLOGIST EVAL & MGMT 11/11/2016 Corrie Mckusick, DO [facility]  . IR GENERIC HISTORICAL  07/16/2016   IR RADIOLOGIST EVAL & MGMT 07/16/2016 Corrie Mckusick, DO [facility]  . LUMBAR LAMINECTOMY/DECOMPRESSION MICRODISCECTOMY N/A 08/14/2015   Procedure: LUMBAR DECOMPRESSION MICRODISCECTOMY L3-S1;  Surgeon: Melina Schools, MD;  Location: [place];  Service: Orthopedics;  Laterality: N/A;  . RADIOFREQUENCY ABLATION Left 02/05/2017   Procedure: LEFT RENAL CRYOABLATION;  Surgeon: Corrie Mckusick, DO;  Location: [facility];  Service: Anesthesiology;  Laterality: Left;  . TONSILLECTOMY AND ADENOIDECTOMY      Allergies: Codeine and Prednisone  Medications: Prior to Admission medications   Medication Sig Start Date End Date Taking? Authorizing Provider  acetaminophen (TYLENOL) 500 MG tablet Take 500 mg by mouth 2 (two) times daily as needed (for pain.).    Historical Provider, MD  albuterol (PROVENTIL HFA;VENTOLIN HFA) 108 (90 BASE) MCG/ACT inhaler Inhale 2 puffs into the lungs every 6 (six) hours as needed for wheezing or shortness  of breath. 08/27/14   Kerrie Buffalo, NP  albuterol (PROVENTIL) (2.5 MG/3ML) 0.083% nebulizer solution USE 1 VIAL IN NEBULIZER THREE TIMES DAILY AS NEEDED Patient taking differently: USE 1 VIAL IN NEBULIZER THREE TIMES DAILY AS NEEDED WHEEZING OR SHORTNESS OF BREATH. 11/18/16   Jearld Fenton, NP  amLODipine (NORVASC) 10 MG tablet Take 5 mg by mouth at bedtime.    Historical Provider, MD  ARIPiprazole (ABILIFY) 20 MG tablet Take 1 tablet (20 mg total) by mouth at bedtime. 08/27/14   Kerrie Buffalo, NP  atorvastatin (LIPITOR) 20 MG tablet Take 20 mg by mouth daily after supper.    Historical Provider, MD  benazepril (LOTENSIN) 5 MG tablet Take 5 mg by mouth at bedtime.    Historical Provider, MD  bisacodyl (DULCOLAX) 10 MG suppository Place 1 suppository (10 mg total) rectally daily as needed for moderate constipation. Patient taking differently: Place 10 mg rectally daily as needed for moderate constipation. For constipation. 09/08/16   Geradine Girt, DO  budesonide-formoterol (SYMBICORT) 160-4.5 MCG/ACT inhaler Inhale 2 puffs into the lungs 2 (two) times daily.    Historical Provider, MD  Cholecalciferol (VITAMIN D3 PO) Take 8,000 Units by mouth every other day.    Historical Provider, MD  donepezil (ARICEPT) 5 MG tablet Take 1 tablet (5 mg total) by mouth at bedtime. 08/27/14   Kerrie Buffalo, NP  dorzolamide-timolol (COSOPT) 22.3-6.8 MG/ML ophthalmic solution Place 1 drop into both eyes 2 (two) times daily.    Historical Provider, MD  esomeprazole (NEXIUM) 20 MG capsule Take 20 mg by mouth daily as needed (For heartburn or acid reflux.).     Historical Provider, MD  glimepiride (AMARYL) 4 MG tablet Take 1 tablet (4 mg total) by mouth daily with breakfast. 08/27/14   Kerrie Buffalo, NP  glucose blood (ACCU-CHEK AVIVA) test strip 1 each by Other route 2 (two) times daily. 08/04/16   Jearld Fenton, NP  hydrOXYzine (ATARAX/VISTARIL) 25 MG tablet Take 50 mg by mouth at bedtime.  04/17/16   Historical Provider, MD  Ipratropium-Albuterol (COMBIVENT) 20-100 MCG/ACT AERS respimat Inhale 1 puff into the lungs every 6 (six) hours.    Historical Provider, MD  irbesartan (AVAPRO) 300 MG tablet Take 1 tablet (300 mg total) by mouth daily. 12/05/15   Tanda Rockers, MD  lactulose (CHRONULAC) 10 GM/15ML solution Take 30 mLs (20 g total) by mouth 2 (two) times daily as needed for mild constipation. 09/08/16   Geradine Girt, DO  lamoTRIgine (LAMICTAL) 100 MG tablet Take 1 tablet (100 mg total) by mouth  every evening. Patient taking differently: Take 100 mg by mouth daily after supper.  08/27/14   Kerrie Buffalo, NP  latanoprost (XALATAN) 0.005 % ophthalmic solution Place 1 drop into both eyes at bedtime. 08/27/14   Kerrie Buffalo, NP  metFORMIN (GLUCOPHAGE) 1000 MG tablet Take 1 tablet (1,000 mg total) by mouth 2 (two) times daily with a meal. 08/27/14   Kerrie Buffalo, NP  mirtazapine (REMERON) 7.5 MG tablet Take 1 tablet (7.5 mg total) by mouth at bedtime. 08/27/14   Kerrie Buffalo, NP  Multiple Vitamin (MULTIVITAMIN WITH MINERALS) TABS tablet Take 1 tablet by mouth daily.    Historical Provider, MD  ondansetron (ZOFRAN) 4 MG tablet Take 1 tablet (4 mg total) by mouth every 8 (eight) hours as needed for nausea or vomiting. 08/14/15   Melina Schools, MD  OXYGEN Inhale 3-4 L into the lungs continuous. 3 L  CONTINOUSLY & 4 L IF PATIENT IS  AMBULARO    Historical Provider, MD  polyethylene glycol (MIRALAX / GLYCOLAX) packet Take 17 g by mouth daily. Patient taking differently: Take 17 g by mouth daily as needed (for constipation.).  09/08/16   Geradine Girt, DO  Selenium Sulf-Pyrithione-Urea 2.25 % SHAM Apply 1 application topically every 14 (fourteen) days. Applied to scalp for dermatitis. 01/25/15   Historical Provider, MD  senna (SENOKOT) 8.6 MG tablet Take 1 tablet by mouth daily as needed for constipation.     Historical Provider, MD     Family History  Problem Relation Age of Onset  . Dementia Mother   . Alzheimer's disease Mother   . Heart disease Mother   . Hypertension Mother   . Diabetes Mother   . Diabetes Father   . Alzheimer's disease Sister   . Heart disease Brother   . Heart attack Brother   . Bladder Cancer Neg Hx   . Kidney cancer Neg Hx   . Prostate cancer Neg Hx     Social History   Social History  . Marital status: Divorced    Spouse name: N/A  . Number of children: 1  . Years of education: college 4   Occupational History  . disabled    Social History Main Topics   . Smoking status: Former Smoker    Packs/day: 1.00    Years: 28.00    Types: Cigarettes    Quit date: 07/15/2015  . Smokeless tobacco: Never Used  . Alcohol use No  . Drug use: No  . Sexual activity: No   Other Topics Concern  . Not on file   Social History Narrative   Patient is right handed.   Patient drinks 2 cups caffeine daily.       Review of Systems: A 12 point ROS discussed and pertinent positives are indicated in the HPI above.  All other systems are negative.  Review of Systems  Vital Signs: BP (!) 175/74 (BP Location: Left Arm, Patient Position: Sitting, Cuff Size: Normal)   Pulse 91   Temp 98 F (36.7 C) (Oral)   Resp 16   Ht _0  (1.6 m)   Wt 213 lb (96.6 kg)   SpO2 96% Comment: O2 at 4L/nasal cannula  BMI 37.73 kg/m   Physical Exam  Targeted exam at the left flank demonstrates small scar in 2 separate location secondary to the treatment. No sign of infection  Mallampati Score:     Imaging: No results found.  Labs:  CBC:  Recent Labs  06/23/16 1215 09/04/16 1241 09/06/16 0530 02/02/17 1449  WBC 6.8 7.4 6.0 7.8  HGB 12.2 11.9* 10.5* 11.3*  HCT 37.9 37.2 32.2* 35.8*  PLT 341 358 323 390    COAGS:  Recent Labs  05/21/16 1205 06/23/16 1215 02/02/17 1449  INR 0.92 0.98 0.91  APTT 37 35  --     BMP:  Recent Labs  09/04/16 1241 09/05/16 1030 09/06/16 0530 02/02/17 1449  NA 140 140 141 137  K 5.3* 5.1 4.9 5.3*  CL 111 110 114* 105  CO2 21* 24 20* 24  GLUCOSE 114* 156* 106* 90  BUN 33* 25* 24* 29*  CALCIUM 10.4* 9.8 9.5 10.8*  CREATININE 1.63* 1.57* 1.53* 1.59*  GFRNONAA 32* 33* 34* 33*  GFRAA 37* 39* 40* 38*    LIVER FUNCTION TESTS:  Recent Labs  03/19/16 1129 09/05/16 1030 02/02/17 1449  BILITOT 0.3 0.6 0.5  AST _1 ALT 12 12* 15  ALKPHOS 75 60 62  PROT 7.7 7.7 8.5*  ALBUMIN 4.4 3.8 4.7    TUMOR MARKERS: No results for input(s): AFPTM, CEA, CA199, CHROMGRNA in the last 8760 hours.  Assessment  and Plan:  Elizabeth Thompson is a 66 year old female, presenting today as her first follow-up appointment status post CT-guided tissue ablation with cryotherapy for a second left-sided renal cell carcinoma.  She is very satisfied with her recovery, and has no complaints.  At this time, we will plan on surveillance CT scan to evaluate the site of treatment which will be at least 3 months after her treatment day of 02/05/2017. Her imaging may occur late May or early April and we will see her as a follow-up appointment at that time.  They're satisfied with the treatment plan.     Electronically Signed: Corrie Mckusick 03/10/2017, 12:11 PM   I spent a total of    15 Minutes in face to face in clinical consultation, greater than 50% of which was counseling/coordinating care for CT-guided cryoablation of left renal cell carcinoma.

## 2017-04-02 DIAGNOSIS — J449 Chronic obstructive pulmonary disease, unspecified: Secondary | ICD-10-CM | POA: Diagnosis not present

## 2017-04-02 DIAGNOSIS — J9621 Acute and chronic respiratory failure with hypoxia: Secondary | ICD-10-CM | POA: Diagnosis not present

## 2017-04-08 DIAGNOSIS — J9621 Acute and chronic respiratory failure with hypoxia: Secondary | ICD-10-CM | POA: Diagnosis not present

## 2017-04-08 DIAGNOSIS — J449 Chronic obstructive pulmonary disease, unspecified: Secondary | ICD-10-CM | POA: Diagnosis not present

## 2017-04-13 ENCOUNTER — Other Ambulatory Visit: Payer: Self-pay | Admitting: Interventional Radiology

## 2017-04-13 ENCOUNTER — Other Ambulatory Visit: Payer: Self-pay | Admitting: *Deleted

## 2017-04-13 DIAGNOSIS — C642 Malignant neoplasm of left kidney, except renal pelvis: Secondary | ICD-10-CM

## 2017-04-14 DIAGNOSIS — E119 Type 2 diabetes mellitus without complications: Secondary | ICD-10-CM | POA: Diagnosis not present

## 2017-04-14 DIAGNOSIS — G4730 Sleep apnea, unspecified: Secondary | ICD-10-CM | POA: Diagnosis not present

## 2017-04-14 DIAGNOSIS — M6281 Muscle weakness (generalized): Secondary | ICD-10-CM | POA: Diagnosis not present

## 2017-04-14 DIAGNOSIS — J449 Chronic obstructive pulmonary disease, unspecified: Secondary | ICD-10-CM | POA: Diagnosis not present

## 2017-04-14 DIAGNOSIS — Z13228 Encounter for screening for other metabolic disorders: Secondary | ICD-10-CM | POA: Diagnosis not present

## 2017-05-02 DIAGNOSIS — J9621 Acute and chronic respiratory failure with hypoxia: Secondary | ICD-10-CM | POA: Diagnosis not present

## 2017-05-02 DIAGNOSIS — J449 Chronic obstructive pulmonary disease, unspecified: Secondary | ICD-10-CM | POA: Diagnosis not present

## 2017-05-03 ENCOUNTER — Ambulatory Visit: Payer: Self-pay | Admitting: Internal Medicine

## 2017-05-04 ENCOUNTER — Ambulatory Visit (INDEPENDENT_AMBULATORY_CARE_PROVIDER_SITE_OTHER): Payer: Medicare Other | Admitting: Internal Medicine

## 2017-05-04 ENCOUNTER — Other Ambulatory Visit (INDEPENDENT_AMBULATORY_CARE_PROVIDER_SITE_OTHER): Payer: Medicare Other

## 2017-05-04 ENCOUNTER — Encounter: Payer: Self-pay | Admitting: Internal Medicine

## 2017-05-04 ENCOUNTER — Ambulatory Visit (INDEPENDENT_AMBULATORY_CARE_PROVIDER_SITE_OTHER)
Admission: RE | Admit: 2017-05-04 | Discharge: 2017-05-04 | Disposition: A | Discharge status: Home / Self Care | Payer: Medicare Other | Source: Ambulatory Visit | Attending: Internal Medicine | Admitting: Internal Medicine

## 2017-05-04 VITALS — BP 134/80 | HR 102 | Ht 63.0 in | Wt 212.0 lb

## 2017-05-04 DIAGNOSIS — N183 Chronic kidney disease, stage 3 unspecified: Secondary | ICD-10-CM

## 2017-05-04 DIAGNOSIS — J9611 Chronic respiratory failure with hypoxia: Secondary | ICD-10-CM

## 2017-05-04 DIAGNOSIS — R0609 Other forms of dyspnea: Secondary | ICD-10-CM

## 2017-05-04 DIAGNOSIS — J449 Chronic obstructive pulmonary disease, unspecified: Secondary | ICD-10-CM | POA: Diagnosis not present

## 2017-05-04 DIAGNOSIS — R05 Cough: Secondary | ICD-10-CM | POA: Diagnosis not present

## 2017-05-04 LAB — CBC WITH DIFFERENTIAL/PLATELET
BASOS PCT: 0.1 % (ref 0.0–3.0)
Basophils Absolute: 0 10*3/uL (ref 0.0–0.1)
EOS ABS: 0.2 10*3/uL (ref 0.0–0.7)
EOS PCT: 2.5 % (ref 0.0–5.0)
HEMATOCRIT: 35.2 % — AB (ref 36.0–46.0)
HEMOGLOBIN: 11.4 g/dL — AB (ref 12.0–15.0)
LYMPHS PCT: 21.2 % (ref 12.0–46.0)
Lymphs Abs: 1.6 10*3/uL (ref 0.7–4.0)
MCHC: 32.3 g/dL (ref 30.0–36.0)
MCV: 80.4 fl (ref 78.0–100.0)
MONO ABS: 0.4 10*3/uL (ref 0.1–1.0)
Monocytes Relative: 4.6 % (ref 3.0–12.0)
NEUTROS PCT: 71.6 % (ref 43.0–77.0)
Neutro Abs: 5.6 10*3/uL (ref 1.4–7.7)
PLATELETS: 451 10*3/uL — AB (ref 150.0–400.0)
RBC: 4.38 Mil/uL (ref 3.87–5.11)
RDW: 16 % — AB (ref 11.5–15.5)
WBC: 7.8 10*3/uL (ref 4.0–10.5)

## 2017-05-04 LAB — BASIC METABOLIC PANEL
BUN: 27 mg/dL — AB (ref 6–23)
CHLORIDE: 106 meq/L (ref 96–112)
CO2: 22 mEq/L (ref 19–32)
Calcium: 10.5 mg/dL (ref 8.4–10.5)
Creatinine, Ser: 1.82 mg/dL — ABNORMAL HIGH (ref 0.40–1.20)
GFR: 35.67 mL/min — ABNORMAL LOW (ref >60.00)
GLUCOSE: 69 mg/dL — AB (ref 70–99)
POTASSIUM: 4.9 meq/L (ref 3.5–5.1)
Sodium: 140 mEq/L (ref 135–145)

## 2017-05-04 LAB — BRAIN NATRIURETIC PEPTIDE: Pro B Natriuretic peptide (BNP): 160 pg/mL — ABNORMAL HIGH (ref 0.0–100.0)

## 2017-05-04 LAB — TSH: TSH: 1.6 u[IU]/mL (ref 0.35–4.50)

## 2017-05-04 NOTE — Patient Instructions (Addendum)
Ok 3lpm except with exertion adjust the flow to keep saturation at or above 90%   Plan A = Automatic = symbicort 160 Take 2 puffs first thing in am and then another 2 puffs about 12 hours later.     Plan B = Backup Only use your combivent  as a rescue medication to be used if you can't catch your breath by resting or doing a relaxed purse lip breathing pattern.  - The less you use it, the better it will work when you need it. - Ok to use the inhaler  1 puffs  every 4 hours if you must but call for appointment if use goes up over your usual need - Don't leave home without it !!  (think of it like the spare tire for your car)   Plan C = Crisis - only use your albuterol nebulizer if you first try Plan B and it fails to help > ok to use the nebulizer up to every 4 hours but if start needing it regularly call for immediate appointment   Please remember to go to the lab and x-ray department downstairs in the basement  for your tests - we will call you with the results when they are available.      See Tammy NP w/in 2 weeks with all your medications, even over the counter meds, separated in two separate bags, the ones you take no matter what vs the ones you stop once you feel better and take only as needed when you feel you need them.   Tammy  will generate for you a new user friendly medication calendar that will put [place] all on the same page re: your medication use.     Without this process, it simply isn't possible to assure that we are providing  your outpatient care  with  the attention to detail we feel you deserve.   If we cannot assure that you're getting that kind of care,  then we cannot manage your problem effectively from this clinic.  Once you have seen Tammy and we are sure that we're all on the same page with your medication use she will arrange follow up with me.  Add:  Echo with bubble study ordered

## 2017-05-04 NOTE — Progress Notes (Signed)
Subjective:    Patient ID: Elizabeth Thompson, female    DOB: Aug 29, 1951,    MRN: [id number]    Brief patient profile:  66 yobf quit smoking aug 2016 with onset of wheezing sob while in rehab p back surgery and some better while in rehab but generally worse since at home since Nov 8th >  On multiple meds no better > referred to pulmonary clinic 12/05/2015 by Elizabeth Thompson    History of Present Illness  12/05/2015 1st [facility] office visit/ Elizabeth Thompson   Chief Complaint  Patient presents with  . Pulmonary Consult    Referred by Elizabeth Thompson. Pt c/o SOB and low o2 sats for the past 3 wks.    doe = MMRC1 = can walk nl pace, flat grade, can't hurry or go uphills or steps s sob  But this is worse since stopped smoking assoc with dry daytime cough and sense of pnds.  Was apparently not needing any resp meds while smoking now no response to multiple inhalers/ nebs  rec Stop lisinopril  Start avapro (ibesartan) 300 mg one daily - ok to break in half if too strong GERD diet   01/17/2016  f/u ov/Elizabeth Thompson re: GOLD 0  Chief Complaint  Patient presents with  . Follow-up    PFT done today. PFT done today. She has had some swelling in her ankles and feet.    no cough off acei/ doe = baseline = fine flat at nl pace  rec You do not have significant copd and you never will unless you resume smoking Your weight is the main issue with your breathing      09/04/2016 Acute OV  : COPD  Pt presents for an acute office visit . Over last 3 weeks she has had cough, congestion , worsening DOE /SOB . Mucus has been green to clear at times. No fever or chest pain  O2 sats have been in 70s . She is on Oxygen 2l/m At bedtime  Only .  Today in office on arrival o2 sats 74% on RA . Walk test in office , required 4l/m to keep sats >90% walking. She denies leg swelling, calf pain, hemotpysis , n/v/d.  Appetite is good .  She was admitted in July 2017 for left renal mass /nephrectomy (renal cell carcinoma ).  She  is here with her caregiver (neice) ,she is very weak.  She will require admission to hospital for further evaluation . rec Admit  Admit date: 09/04/2016 Discharge date: 09/08/2016   Recommendations for Outpatient Follow-Up:   1. Home health 2. Resume home O2 3. pulm follow up with Dr. Melvyn Thompson 4. Bowel regimen   Discharge Diagnosis:   Principal Problem:   Acute on chronic respiratory failure with hypoxemia (HCC) Active Problems:   Schizoaffective disorder (HCC)   Hypercalcemia   Diabetes mellitus type 2, controlled, without complications (HCC)   HLD (hyperlipidemia)   Essential hypertension   GERD (gastroesophageal reflux disease)   Renal malignant tumor (HCC)   COPD exacerbation (HCC)   Depression   Acute on chronic respiratory failure with hypoxia [facility])   Discharge disposition:  Home.  Discharge Condition: Improved.  Diet recommendation: Low sodium, heart healthy.  Carbohydrate-modified.  Wound care: None.   History of Present Illness:   Elizabeth Elizabeth Shippis a 66 y.o.femalewith medical history significant of hypertension, hyperlipidemia, diabetes mellitus, COPD, on home oxygen 2-3 L, GERD, hypercalcemia, formal smoker, bipolar, depression, back pain, renal cell carcinoma (S/P left nephrectomy 06/2016, no  radiation and chemotherapy), who presents with worsening shortness of breath.  Patient states that she has worsening shortness of breath which has been going on for 3 weeks. She coughs up dlear and thickmucus. No fever, chills, chest pain, runny nose throat. Patient denies nausea, vomiting, abdominal pain. She states that she has intermittent diarrhea which is alleviated by eating yogurt. She had one BM with loose stool today. Nosymptoms of UTI or unilateral weakness. Pt was seen by her pulmonologist, Dr. Melvyn Thompson today, who switched lisinopril to Irbesartan, and recommended admission.   Hospital Course by Problem:   Acute on chronic respiratory  failure with hypoxemia:Etiology is not clear. Chest x-ray is negative. No PE by VQ scan. Patient has slightly decreased air movement bilaterally, but otherwise clear on auscultation. Pt dose not seem to have COPD exacerbation.It is possible that the patient justhave COPD progression. Patient does not have fever or chills.  -echo: grade 2 diastolic dysfunction but BNP normal- weight appears stable Continue-Nebulizers: scheduled Duoneb and prn albuterol -Tessalonfor cough  - respiratory virus panel negative -Nasal cannula oxygen to maintain oxygen saturation above 90%- re-order home O2-- was put on O2 3-4 weeks ago  Schizoaffective disorder and depression:stable -Continue Abilify, lamictal  Hypercalcemia:this is chronic issue. Etiology is not clear. Baseline calcium 10.4-10.5. Her calcium is 10.4 today. -Follow-up by BMP  DM-II:Last A1c 7.3, fairly controled. Patient is taking metformin and Amaryl at home -SSI  DJS:HFWY LDL was not on record -Continue home medications: Lipitor  GERD: -Protonix  Constipation -bowel regimen ordered      05/04/2017  Extended f/u ov/Elizabeth Thompson re: re-establish care/ consultation per Dr Benna Thompson for sob/ hypoxemia with nl spirometry  Chief Complaint  Patient presents with  . Pulmonary Consult    Referred by Dr Benna Thompson for f/u on COPD. She reports no changes in her breathing since she was here last in Feb 2017.   wearing 02 3lpm but with ex go to 4lpm  And rides bike up to 8mn  Cannot walk  50 ft and has to stop even on 4lpm  Prior to back surgery getting around pretty well  around 2016 @ 25 lb less than now  Sleeps on side with 3 pillows Symbicort/ combivent and neb at least 4 h  prior to OV  For Wheeze when walk not really helping and way over using saba  No obvious day to day or daytime variability or assoc excess/ purulent sputum or mucus plugs or hemoptysis or cp or chest tightness, subjective wheeze or overt sinus or hb symptoms. No  unusual exp hx or h/o childhood pna/ asthma or knowledge of premature birth.  Sleeping ok without nocturnal  or early am exacerbation  of respiratory  c/o's or need for noct saba. Also denies any obvious fluctuation of symptoms with weather or environmental changes or other aggravating or alleviating factors except as outlined above   Current Medications, Allergies, Complete Past Medical History, Past Surgical History, Family History, and Social History were reviewed in [employer].  ROS  The following are not active complaints unless bolded sore throat, dysphagia, dental problems, itching, sneezing,  nasal congestion or excess/ purulent secretions, ear ache,   fever, chills, sweats, unintended wt loss, classically pleuritic or exertional cp,  orthopnea pnd or leg swelling, presyncope, palpitations, abdominal pain, anorexia, nausea, vomiting, diarrhea  or change in bowel or bladder habits, change in stools or urine, dysuria,hematuria,  rash, arthralgias, visual complaints, headache, numbness, weakness or ataxia or problems with walking or  coordination,  change in mood/affect or memory.               Objective:   Physical Exam  amb obese bf nad     Wt Readings from Last 3 Encounters:  05/04/17 212 lb (96.2 kg)  03/10/17 213 lb (96.6 kg)  02/06/17 232 lb 9.6 oz (105.5 kg)    Vital signs reviewed - Note on arrival 02 sats  92% on  3lpm            HEENT: nl dentition, turbinates, and oropharynx. Nl external ear canals without cough reflex   NECK :  without JVD/Nodes/TM/ nl carotid upstrokes bilaterally   LUNGS: no acc muscle use,  Nl contour chest which is clear to A and P bilaterally without cough on insp or exp maneuvers   CV:  RRR  no s3 or murmur or increase in P2, no edema , neg calf pain   ABD:  Very obese soft and nontender with nl inspiratory excursion in the supine position. No bruits or organomegaly, bowel sounds nl  MS:  Nl gait/ ext warm  without deformities, calf tenderness, cyanosis or clubbing No obvious joint restrictions   SKIN: warm and dry without lesions    NEURO:  alert, approp, nl sensorium with  no motor deficits        CXR PA and Lateral:   05/04/2017 :    I personally reviewed images / impression as follows:   slt reduced lung volumes with CM s overt chf or acute changes   Labs ordered/ reviewed:      Chemistry      Component Value Date/Time   NA 140 05/04/2017 1634   NA 142 09/05/2014 1459   K 4.9 05/04/2017 1634   CL 106 05/04/2017 1634   CO2 22 05/04/2017 1634   BUN 27 (H) 05/04/2017 1634   BUN 9 09/05/2014 1459   CREATININE 1.82 (H) 05/04/2017 1634      Component Value Date/Time   CALCIUM 10.5 05/04/2017 1634   ALKPHOS 62 02/02/2017 1449   AST 26 02/02/2017 1449   ALT 15 02/02/2017 1449   BILITOT 0.5 02/02/2017 1449        Lab Results  Component Value Date   WBC 7.8 05/04/2017   HGB 11.4 (L) 05/04/2017   HCT 35.2 (L) 05/04/2017   MCV 80.4 05/04/2017   PLT 451.0 (H) 05/04/2017         Lab Results  Component Value Date   TSH 1.60 05/04/2017     Lab Results  Component Value Date   PROBNP 160.0 (H) 05/04/2017

## 2017-05-05 DIAGNOSIS — N183 Chronic kidney disease, stage 3 unspecified: Secondary | ICD-10-CM | POA: Insufficient documentation

## 2017-05-05 DIAGNOSIS — J9611 Chronic respiratory failure with hypoxia: Secondary | ICD-10-CM | POA: Insufficient documentation

## 2017-05-05 LAB — RESPIRATORY ALLERGY PROFILE REGION II ~~LOC~~
Allergen, A. alternata, m6: <0.1 kU/L
Allergen, C. Herbarum, M2: <0.1 kU/L
Allergen, Comm Silver Birch, t9: <0.1 kU/L
Allergen, D pternoyssinus,d7: 0.62 kU/L — ABNORMAL HIGH
Allergen, Mouse Urine Protein, e78: <0.1 kU/L
Allergen, Mulberry, t76: <0.1 kU/L
Aspergillus fumigatus, m3: <0.1 kU/L
Bermuda Grass: <0.1 kU/L
Box Elder IgE: <0.1 kU/L
Cat Dander: <0.1 kU/L
Common Ragweed: <0.1 kU/L
D. farinae: 0.48 kU/L — ABNORMAL HIGH
Dog Dander: <0.1 kU/L
Elm IgE: <0.1 kU/L
IGE (IMMUNOGLOBULIN E), SERUM: 7 kU/L (ref <115)
Johnson Grass: <0.1 kU/L
Timothy Grass: <0.1 kU/L

## 2017-05-05 NOTE — Assessment & Plan Note (Signed)
PFT's  01/17/2016  FEV1 2.07 (105 % ) ratio 89  p 0 % improvement from saba with DLCO  37 % corrects to 49 % for alv volume with ERV 34   - Spirometry 05/04/2017  FEV1 1.42 (77%)  Ratio 81 with nl curvature    - The proper method of use, as well as anticipated side effects, of a metered-dose inhaler are discussed and demonstrated to the patient. Improved effectiveness after extensive coaching during this visit to a level of approximately 75 % from a baseline of 50 % > continue hfa    Clearly this is not copd and Symptoms are markedly disproportionate to objective findings (other than desats, see sep a/p)  and not clear this is actually much of a  lung problem but pt does appear to have difficult to sort out respiratory symptoms of unknown origin for which  DDX  = almost all start with A and  include Adherence, Ace Inhibitors, Acid Reflux, Active Sinus Disease, Alpha 1 Antitripsin deficiency, Anxiety masquerading as Airways dz,  ABPA,  Allergy(esp in young), Aspiration (esp in elderly), Adverse effects of meds,  Active smokers, A bunch of PE's (a small clot burden can't cause this syndrome unless there is already severe underlying pulm or vascular dz with poor reserve) plus two Bs  = Bronchiectasis and Beta blocker use..and one C= CHF   Adherence is always the initial "prime suspect" and is a multilayered concern that requires a "trust but verify" approach in every patient - starting with knowing how to use medications, especially inhalers, correctly, keeping up with refills and understanding the fundamental difference between maintenance and prns vs those medications only taken for a very short course and then stopped and not refilled.  - see hfa teaching - needs to return with all meds in hand using a trust but verify approach to confirm accurate Medication  Reconciliation The principal here is that until we are certain that the  patients are doing what we've asked, it makes no sense to ask them to do more.     ? Allergy / asthma > continue symbicort but consider lowering dose to 80 bid when returns as I am very doubtful she has much asthma at all.  ? Anxiety > usually at the bottom of this list of usual suspects but should be much higher on this pt's based on H and P and note already on psychotropics .   ? A bunch of PEs > always a concern in the morbidly obese but her symptoms are all chronic and have not evolved since last neg v/q  ? ACEi > denies taking now but still on med list > desperately needs to return for accurate med reconciliation   ? CHF > note bnp indeterminant and she has h/o diastolic dysfunction > repeat echo with bubble study on return   I had an extended discussion with the patient reviewing all relevant studies completed to date and  lasting 25 minutes of a 40  minute office  visit for consultation/ to re-establish     re  severe non-specific but potentially very serious refractory respiratory symptoms of uncertain and potentially multiple  etiologies.   Each maintenance medication was reviewed in detail including most importantly the difference between maintenance and prns and under what circumstances the prns are to be triggered using an action plan format that is not reflected in the computer generated alphabetically organized AVS.    Please see AVS for specific instructions unique to this  office visit that I personally wrote and verbalized to the the pt in detail and then reviewed with pt  by my nurse highlighting any changes in therapy/plan of care  recommended at today's visit.

## 2017-05-05 NOTE — Assessment & Plan Note (Signed)
Estimated Creatinine Clearance: 33.6 mL/min (A) (by C-G formula based on SCr of 1.82 mg/dL (H)).   Lab Results  Component Value Date   CREATININE 1.82 (H) 05/04/2017   CREATININE 1.59 (H) 02/02/2017   CREATININE 1.53 (H) 09/06/2016    Needs to make sure no longer takes acei or nsaids > Follow up per Primary Care planned  ? Renal f/u also?  Note that this may prevent the nl compensation for OHS (hypercarbia compensated by secondary met alkalosis so low threshold to add oracit here.

## 2017-05-05 NOTE — Progress Notes (Signed)
LMTCB for pt's niece, Olin Hauser

## 2017-05-05 NOTE — Assessment & Plan Note (Signed)
Note mild anemia/ nl tsh/ progressive renal insufficiency (see sep a/p)

## 2017-05-05 NOTE — Assessment & Plan Note (Signed)
02 sats at rest on 3lpm 92% 05/04/2017 but needed 6lpm with walking   She does have emphysematous changes on CT that could create some v/q mismatches but also concerned here about R to L shut so needs echo with bubble study next > ordered

## 2017-05-05 NOTE — Assessment & Plan Note (Signed)
pfts with ERV 34% 01/17/2016   Body mass index is 37.55 kg/m.  -  Trending down/ encouraged  Lab Results  Component Value Date   TSH 1.60 05/04/2017     Contributing to gerd risk/ doe/reviewed the need and the process to achieve and maintain neg calorie balance > defer f/u primary care including intermittently monitoring thyroid status

## 2017-05-05 NOTE — Progress Notes (Signed)
LMTCB for her Niece, Olin Hauser

## 2017-05-06 NOTE — Progress Notes (Signed)
[email or web address]

## 2017-05-08 DIAGNOSIS — J449 Chronic obstructive pulmonary disease, unspecified: Secondary | ICD-10-CM | POA: Diagnosis not present

## 2017-05-08 DIAGNOSIS — J9621 Acute and chronic respiratory failure with hypoxia: Secondary | ICD-10-CM | POA: Diagnosis not present

## 2017-05-12 ENCOUNTER — Encounter: Payer: Self-pay | Admitting: Internal Medicine

## 2017-05-12 NOTE — Progress Notes (Signed)
Letter mailed

## 2017-05-13 ENCOUNTER — Ambulatory Visit (HOSPITAL_COMMUNITY)
Admission: RE | Admit: 2017-05-13 | Discharge: 2017-05-13 | Disposition: A | Discharge status: Home / Self Care | Payer: Medicare Other | Source: Ambulatory Visit | Attending: Interventional Radiology | Admitting: Interventional Radiology

## 2017-05-13 ENCOUNTER — Ambulatory Visit
Admission: RE | Admit: 2017-05-13 | Discharge: 2017-05-13 | Disposition: A | Discharge status: Home / Self Care | Payer: Medicare Other | Source: Ambulatory Visit | Attending: Interventional Radiology | Admitting: Interventional Radiology

## 2017-05-13 DIAGNOSIS — Z9889 Other specified postprocedural states: Secondary | ICD-10-CM | POA: Diagnosis not present

## 2017-05-13 DIAGNOSIS — C642 Malignant neoplasm of left kidney, except renal pelvis: Secondary | ICD-10-CM | POA: Insufficient documentation

## 2017-05-13 DIAGNOSIS — J439 Emphysema, unspecified: Secondary | ICD-10-CM | POA: Insufficient documentation

## 2017-05-13 DIAGNOSIS — I700 Atherosclerosis of aorta: Secondary | ICD-10-CM | POA: Insufficient documentation

## 2017-05-13 DIAGNOSIS — N281 Cyst of kidney, acquired: Secondary | ICD-10-CM | POA: Insufficient documentation

## 2017-05-13 HISTORY — PX: IR RADIOLOGIST EVAL & MGMT: IMG5224

## 2017-05-13 LAB — POCT I-STAT CREATININE: CREATININE: 1.8 mg/dL — AB (ref 0.44–1.00)

## 2017-05-13 MED ORDER — IOPAMIDOL (ISOVUE-300) INJECTION 61%
INTRAVENOUS | Status: AC
  Administered 2017-05-13: 75 mL
  Filled 2017-05-13: qty 75

## 2017-05-13 NOTE — Progress Notes (Signed)
Chief Complaint: Follow up.   Referring Physician(s): Dr Hollice Espy, [facility] Dr Zola Button, Oncology  History of Present Illness: History: Elizabeth Thompson is a 66 y.o. female presenting today as a scheduled follow-up to vascular interventional radiology clinic, status post CT-guided ablation with cryotherapy of a second left-sided renal cell carcinoma.  Our most recent treatment was 02/05/2017, when we treated a new left renal cell carcinoma with cryoablation, single probe. She was hospitalized overnight routinely for 23 hour observation and was discharged uneventfully the next day on 2/24.  Previously, we treated a superior pole RCC with cryoablation, performed 06/26/2017.    Interval history: Today Ms Charlet returns with her niece for the interview.  She is now just over 3 months post treatment.    She has just had a contrast-enhanced CT performed, which shows expected changes of both tumor sites, with no concerning findings of recurrence/progression.  No new suspicious lesions identified.   She tells me she feels just fine from the standpoint of her ablation.  Her complaints continue to be regarding her pulmonary issues, for which she sees Dr. Melvyn Novas.    Today her creatinine is 1.8, which is slightly increased from 3 months ago when the creatinine was 1.6.  Her BUN is also slightly elevated today.     Past Medical History:  Diagnosis Date  . Arthritis   . Back pain   . Bell's palsy   . Bipolar disorder ([place])   . Bronchitis   . Chronic low back pain 05/09/2015  . COPD (chronic obstructive pulmonary disease) ([place])    hyoxia during sleep- using O2 as needed  . Cough with expectoration 05/21/16   recent diagnosis of bronchitis  . Cyst of right kidney   . Diabetes mellitus without complication ([place])    type 2  . Frequency of urination   . GERD (gastroesophageal reflux disease)   . Glaucoma   . History of blood transfusion   . History of  colon polyps   . History of hiatal hernia   . History of tobacco use   . Hypercalcemia   . Hyperlipidemia   . Hypertension   . Memory disorder 09/05/2014  . On home oxygen therapy    3L/M [place] at night   . Schizo-affective psychosis ([place])   . Shortness of breath dyspnea    exertion, or with out oxygen  . Uterine fibroid     Past Surgical History:  Procedure Laterality Date  . ABDOMINAL HYSTERECTOMY    . APPENDECTOMY    . COLONOSCOPY W/ POLYPECTOMY    . IR GENERIC HISTORICAL  04/29/2016   IR RADIOLOGIST EVAL & MGMT 04/29/2016 Corrie Mckusick, DO [facility] INTERV RAD  . IR GENERIC HISTORICAL  10/01/2016   IR RADIOLOGIST EVAL & MGMT 10/01/2016 [facility] INTER[facility]  . IR GENERIC HISTORICAL  11/11/2016   IR RADIOLOGIST EVAL & MGMT 11/11/2016 Corrie Mckusick, DO [facility] INTER[facility]  . IR GENERIC HISTORICAL  07/16/2016   IR RADIOLOGIST EVAL & MGMT 07/16/2016 Corrie Mckusick, DO [facility] INTER[facility] RAD  . LUMBAR LAMINECTOMY/DECOMPRESSION MICRODISCECTOMY N/A 08/14/2015   Procedure: LUMBAR DECOMPRESSION MICRODISCECTOMY L3-S1;  Surgeon: Melina Schools, MD;  Location: [facility];  Service: Orthopedics;  Laterality: N/A;  . RADIOFREQUENCY ABLATION Left 02/05/2017   Procedure: LEFT RENAL CRYOABLATION;  Surgeon: Corrie Mckusick, DO;  Location: [facility];  Service: Anesthesiology;  Laterality: Left;  . TONSILLECTOMY AND ADENOIDECTOMY      Allergies: Codeine and Prednisone  Medications: Prior to Admission  medications   Medication Sig Start Date End Date Taking? Authorizing Provider  acetaminophen (TYLENOL) 500 MG tablet Take 500 mg by mouth 2 (two) times daily as needed (for pain.).    [provider]  albuterol (PROVENTIL HFA;VENTOLIN HFA) 108 (90 BASE) MCG/ACT inhaler Inhale 2 puffs into the lungs every 6 (six) hours as needed for wheezing or shortness of breath. 08/27/14   Kerrie Buffalo, NP  albuterol (PROVENTIL) (2.5 MG/3ML) 0.083% nebulizer solution USE 1 VIAL IN NEBULIZER THREE TIMES DAILY AS NEEDED Patient taking  differently: USE 1 VIAL IN NEBULIZER THREE TIMES DAILY AS NEEDED WHEEZING OR SHORTNESS OF BREATH. 11/18/16   Jearld Fenton, NP  amLODipine (NORVASC) 10 MG tablet Take 5 mg by mouth at bedtime.    [provider]  ARIPiprazole (ABILIFY) 20 MG tablet Take 1 tablet (20 mg total) by mouth at bedtime. 08/27/14   Kerrie Buffalo, NP  atorvastatin (LIPITOR) 20 MG tablet Take 20 mg by mouth daily after supper.    [provider]  bisacodyl (DULCOLAX) 10 MG suppository Place 1 suppository (10 mg total) rectally daily as needed for moderate constipation. Patient taking differently: Place 10 mg rectally daily as needed for moderate constipation. For constipation. 09/08/16   Geradine Girt, DO  budesonide-formoterol (SYMBICORT) 160-4.5 MCG/ACT inhaler Inhale 2 puffs into the lungs 2 (two) times daily.    [provider]  Cholecalciferol (VITAMIN D3 PO) Take 8,000 Units by mouth every other day.    [provider]  donepezil (ARICEPT) 5 MG tablet Take 1 tablet (5 mg total) by mouth at bedtime. 08/27/14   Kerrie Buffalo, NP  dorzolamide-timolol (COSOPT) 22.3-6.8 MG/ML ophthalmic solution Place 1 drop into both eyes 2 (two) times daily.    [provider]  esomeprazole (NEXIUM) 20 MG capsule Take 20 mg by mouth daily as needed (For heartburn or acid reflux.).     [provider]  glimepiride (AMARYL) 4 MG tablet Take 1 tablet (4 mg total) by mouth daily with breakfast. 08/27/14   Kerrie Buffalo, NP  glucose blood (ACCU-CHEK AVIVA) test strip 1 each by Other route 2 (two) times daily. 08/04/16   Jearld Fenton, NP  hydrOXYzine (ATARAX/VISTARIL) 25 MG tablet Take 50 mg by mouth at bedtime.  04/17/16   [provider]  Ipratropium-Albuterol (COMBIVENT) 20-100 MCG/ACT AERS respimat Inhale 1 puff into the lungs every 6 (six) hours.    [provider]  lactulose (CHRONULAC) 10 GM/15ML solution Take 30 mLs (20 g total) by mouth 2 (two) times daily as  needed for mild constipation. 09/08/16   Geradine Girt, DO  lamoTRIgine (LAMICTAL) 100 MG tablet Take 1 tablet (100 mg total) by mouth every evening. Patient taking differently: Take 100 mg by mouth daily after supper.  08/27/14   Kerrie Buffalo, NP  latanoprost (XALATAN) 0.005 % ophthalmic solution Place 1 drop into both eyes at bedtime. 08/27/14   Kerrie Buffalo, NP  metFORMIN (GLUCOPHAGE) 1000 MG tablet Take 1 tablet (1,000 mg total) by mouth 2 (two) times daily with a meal. 08/27/14   Kerrie Buffalo, NP  mirtazapine (REMERON) 7.5 MG tablet Take 1 tablet (7.5 mg total) by mouth at bedtime. 08/27/14   Kerrie Buffalo, NP  Multiple Vitamin (MULTIVITAMIN WITH MINERALS) TABS tablet Take 1 tablet by mouth daily.    [provider]  ondansetron (ZOFRAN) 4 MG tablet Take 1 tablet (4 mg total) by mouth every 8 (eight) hours as needed for nausea or vomiting. 08/14/15  Melina Schools, MD  OXYGEN Inhale 3-4 L into the lungs continuous. 3 L  CONTINOUSLY & 4 L IF PATIENT IS AMBULARO    [provider]  polyethylene glycol (MIRALAX / GLYCOLAX) packet Take 17 g by mouth daily. Patient taking differently: Take 17 g by mouth daily as needed (for constipation.).  09/08/16   Geradine Girt, DO  Selenium Sulf-Pyrithione-Urea 2.25 % SHAM Apply 1 application topically every 14 (fourteen) days. Applied to scalp for dermatitis. 01/25/15   [provider]  senna (SENOKOT) 8.6 MG tablet Take 1 tablet by mouth daily as needed for constipation.     [provider]     Family History  Problem Relation Age of Onset  . Dementia Mother   . Alzheimer's disease Mother   . Heart disease Mother   . Hypertension Mother   . Diabetes Mother   . Diabetes Father   . Alzheimer's disease Sister   . Heart disease Brother   . Heart attack Brother   . Bladder Cancer Neg Hx   . Kidney cancer Neg Hx   . Prostate cancer Neg Hx     Social History   Social History  . Marital status: Divorced     Spouse name: N/A  . Number of children: 1  . Years of education: college 4   Occupational History  . disabled    Social History Main Topics  . Smoking status: Former Smoker    Packs/day: 1.00    Years: 28.00    Types: Cigarettes    Quit date: 07/15/2015  . Smokeless tobacco: Never Used  . Alcohol use No  . Drug use: No  . Sexual activity: No   Other Topics Concern  . Not on file   Social History Narrative   Patient is right handed.   Patient drinks 2 cups caffeine daily.    Review of Systems: A 12 point ROS discussed and pertinent positives are indicated in the HPI above.  All other systems are negative.  Review of Systems  Vital Signs: BP 139/79   Pulse 95   Temp 98 F (36.7 C) (Oral)   Resp 17   Ht _0  (1.6 m)   Wt 213 lb (96.6 kg)   SpO2 92% Comment: O2 at 4L/Nasal Cannula  BMI 37.73 kg/m   Physical Exam  Imaging: Dg Chest 2 View  Result Date: 05/05/2017 CLINICAL DATA:  Dyspnea on exertion. Cough and chest congestion. Wheezing. EXAM: CHEST  2 VIEW COMPARISON:  02/10/2017 and 09/04/2016 FINDINGS: Borderline cardiomegaly. Chronic slight prominence of the main pulmonary artery. Pulmonary vascularity is otherwise normal. No infiltrates or effusions. Tiny linear scarring at the left lung base laterally. No acute bone abnormality.  Calcification in the right rotator cuff. IMPRESSION: New borderline cardiomegaly. Electronically Signed   By: Lorriane Shire M.D.   On: 05/05/2017 09:11   Ct Abdomen W Wo Contrast  Result Date: 05/13/2017 CLINICAL DATA:  Status post cryoablation left renal cell carcinoma on 02/05/2017 EXAM: CT ABDOMEN WITHOUT AND WITH CONTRAST TECHNIQUE: Multidetector CT imaging of the abdomen was performed following the standard protocol before and following the bolus administration of intravenous contrast. CONTRAST:  44m ISOVUE-300 IOPAMIDOL (ISOVUE-300) INJECTION 61% COMPARISON:  Procedure study from 02/05/2017. Diagnostic CT scan 10/01/2016 FINDINGS:  Lower chest:  Emphysema. Hepatobiliary: No focal enhancing abnormality within the liver parenchyma. Tiny low-density lesion lateral segment left liver stable since 10/01/2016, likely a cyst. Tiny calcified gallstones evident. No intrahepatic or extrahepatic biliary dilation. Pancreas: No  focal mass lesion. No dilatation of the main duct. No intraparenchymal cyst. No peripancreatic edema. Spleen: No splenomegaly. No focal mass lesion. Adrenals/Urinary Tract: No adrenal nodule or mass. Remote upper pole ablation defect left kidney demonstrates interval retraction measuring 2.2 cm today compared to 2.9 cm previously. More recent ablation defect anterior aspect upper pole is at the site of the concerning lesion identified on 10/01/2016. Today, this represents an area of irregular soft tissue attenuation demonstrating hypoenhancement. Imaging features most suggestive of post ablation scarring/granulation. Multiple cysts are again noted in both kidneys some of which have simple features and the other of which demonstrate increased attenuation on precontrast imaging. No definite enhancing or suspicious mass lesion is identified in either kidney Stomach/Bowel: Tiny hiatal hernia. Stomach unremarkable. Duodenum is normally positioned as is the ligament of Treitz. Visualize small bowel loops and colonic segments of the abdomen are unremarkable. Vascular/Lymphatic: There is abdominal aortic atherosclerosis without aneurysm. There is no gastrohepatic or hepatoduodenal ligament lymphadenopathy. No intraperitoneal or retroperitoneal lymphadenopathy. Other: No intraperitoneal free fluid. Musculoskeletal: Bone windows reveal no worrisome lytic or sclerotic osseous lesions. IMPRESSION: 1. Status post left renal cryoablation for 2 separate lesions in the upper pole. Expected appearance of the ablation defect at both sites with no features to suggest local recurrence. 2. Multiple bilateral simple renal cysts with other scattered cysts  having imaging features suggesting complication by proteinaceous debris or hemorrhage. No overtly suspicious renal lesion on today's exam. 3.  Abdominal Aortic Atherosclerois (ICD10-170.0) 4.  Emphysema. ([id number]) Electronically Signed   By: Misty Stanley M.D.   On: 05/13/2017 14:39    Labs:  CBC:  Recent Labs  09/04/16 1241 09/06/16 0530 02/02/17 1449 05/04/17 1634  WBC 7.4 6.0 7.8 7.8  HGB 11.9* 10.5* 11.3* 11.4*  HCT 37.2 32.2* 35.8* 35.2*  PLT 358 323 390 451.0*    COAGS:  Recent Labs  05/21/16 1205 06/23/16 1215 02/02/17 1449  INR 0.92 0.98 0.91  APTT 37 35  --     BMP:  Recent Labs  09/04/16 1241 09/05/16 1030 09/06/16 0530 02/02/17 1449 05/04/17 1634 05/13/17 1215  NA 140 140 141 137 140  --   K 5.3* 5.1 4.9 5.3* 4.9  --   CL 111 110 114* 105 106  --   CO2 21* 24 20* 24 22  --   GLUCOSE 114* 156* 106* 90 69*  --   BUN 33* 25* 24* 29* 27*  --   CALCIUM 10.4* 9.8 9.5 10.8* 10.5  --   CREATININE 1.63* 1.57* 1.53* 1.59* 1.82* 1.80*  GFRNONAA 32* 33* 34* 33*  --   --   GFRAA 37* 39* 40* 38*  --   --     LIVER FUNCTION TESTS:  Recent Labs  09/05/16 1030 02/02/17 1449  BILITOT 0.6 0.5  AST 17 26  ALT 12* 15  ALKPHOS 60 62  PROT 7.7 8.5*  ALBUMIN 3.8 4.7    TUMOR MARKERS: No results for input(s): AFPTM, CEA, CA199, CHROMGRNA in the last 8760 hours.  Assessment and Plan:  Ms Gundry is a 66 yo female SP CT guided cryo-ablation for 2 left-sided RCC, with the most recent treatment 3 months ago.    Repeat CT imaging performed today shows expected treatment changes, with no concerning features for residual disease.  No evidence of additional sites of disease.    She has recovered well after our treatment, and she and her niece seem quite pleased with the results as we have shared them.  At this point, we will plan on continuing her surveillance scheduled.  Given her baseline renal dysfunction, I think it would be reasonable to extend the  interval to decrease the number of CT studies that she would receive.  I discussed this risk/benefit strategy with her and her niece, who agree.    Plan:  - We will schedule a follow up appointment with imaging in ~5-6 months, which would be October/November, with contrast enhanced CT of the abdomen.   - I have encouraged her to observe all of her other appointments.     Electronically Signed: Corrie Mckusick 05/13/2017, 3:33 PM   I spent a total of    15 Minutes in face to face in clinical consultation, greater than 50% of which was counseling/coordinating care for left RCC, sp cryoablation x 2.

## 2017-05-25 ENCOUNTER — Ambulatory Visit (INDEPENDENT_AMBULATORY_CARE_PROVIDER_SITE_OTHER): Payer: Medicare Other | Admitting: Adult Health

## 2017-05-25 ENCOUNTER — Encounter: Payer: Self-pay | Admitting: Adult Health

## 2017-05-25 VITALS — BP 118/80 | HR 88 | Ht 63.75 in | Wt 212.6 lb

## 2017-05-25 DIAGNOSIS — R05 Cough: Secondary | ICD-10-CM | POA: Diagnosis not present

## 2017-05-25 DIAGNOSIS — R269 Unspecified abnormalities of gait and mobility: Secondary | ICD-10-CM | POA: Diagnosis not present

## 2017-05-25 DIAGNOSIS — I2609 Other pulmonary embolism with acute cor pulmonale: Secondary | ICD-10-CM | POA: Diagnosis not present

## 2017-05-25 DIAGNOSIS — J9611 Chronic respiratory failure with hypoxia: Secondary | ICD-10-CM | POA: Diagnosis not present

## 2017-05-25 DIAGNOSIS — R0609 Other forms of dyspnea: Secondary | ICD-10-CM

## 2017-05-25 DIAGNOSIS — J449 Chronic obstructive pulmonary disease, unspecified: Secondary | ICD-10-CM

## 2017-05-25 NOTE — Assessment & Plan Note (Signed)
Persistent hypoxia depsite minimal COPD  Cont on o2  Check echo with bubble study .

## 2017-05-25 NOTE — Patient Instructions (Addendum)
Follow med calendar closely and bring to each visit.  Set up for Echo w/ bubble study .  Continue on Symbicort 2 puffs Twice daily  .  Refer to Pulmonary Rehab.  Continue on oxygen 3/l/m  Follow up with PCP for mammogram and abnormal axillary lymph node in next few weeks.  Follow up in 4-6 weeks with Dr. Melvyn Novas  And As needed   Please contact office for sooner follow up if symptoms do not improve or worsen or seek emergency care

## 2017-05-25 NOTE — Progress Notes (Signed)
_0  ID: Elizabeth Thompson, female    DOB: 09/10/1951, 66 y.o.   MRN: [id number]  No chief complaint on file.   Referring provider: Everardo Beals, NP  HPI: 66 year old female former smoker followed for Emphysema and O2 Respiratory Failure   TEST  PFT's  01/17/2016  FEV1 2.07 (105 % ) ratio 89  p 0 % improvement from saba with DLCO  37 % corrects to 49 % for alv volume with ERV 34   - Spirometry 05/04/2017  FEV1 1.42 (77%)  Ratio 81 with nl curvature   VQ scan 08/2016 neg  -CT chest 10/2016 >emphysema     05/25/2017 Follow up : Emphysema /O2 RF and Med Review  Pt returns for a 3 week follow up and med review  Patient is a accompanied by her family member. We reviewed all her medications organize them into a medication calendar.[name] Appears to be taking her medications correctly  She remains on oxygen at 3 L. O2 saturation today was 96% on 3 L.  Patient has been recommended to have a 2-D echo with bubble study due to her hypoxemia despite minimal COPD . She does have emphysema on CT chest .  She has DOE with walking , gives out easily . No flare of cough .    CT chest in 10/2016 showed resolved lung nodule . Did show axillary lymph node. She is not up to date on mammorgram . Discussed . follow up with PCP regarding this .       Allergies  Allergen Reactions  . Codeine Nausea And Vomiting  . Prednisone Other (See Comments)    Has to monitor because she is a diabetic     Immunization History  Administered Date(s) Administered  . Influenza,inj,Quad PF,36+ Mos 09/17/2016  . Pneumococcal Conjugate-13 09/17/2016  . Pneumococcal Polysaccharide-23 08/19/2014    Past Medical History:  Diagnosis Date  . Arthritis   . Back pain   . Bell's palsy   . Bipolar disorder ([place])   . Bronchitis   . Chronic low back pain 05/09/2015  . COPD (chronic obstructive pulmonary disease) ([place])    hyoxia during sleep- using O2 as needed  . Cough with expectoration 05/21/16   recent diagnosis  of bronchitis  . Cyst of right kidney   . Diabetes mellitus without complication ([place])    type 2  . Frequency of urination   . GERD (gastroesophageal reflux disease)   . Glaucoma   . History of blood transfusion   . History of colon polyps   . History of hiatal hernia   . History of tobacco use   . Hypercalcemia   . Hyperlipidemia   . Hypertension   . Memory disorder 09/05/2014  . On home oxygen therapy    3L/M [place] at night   . Schizo-affective psychosis ([place])   . Shortness of breath dyspnea    exertion, or with out oxygen  . Uterine fibroid     Tobacco History: History  Smoking Status  . Former Smoker  . Packs/day: 1.00  . Years: 28.00  . Types: Cigarettes  . Quit date: 07/15/2015  Smokeless Tobacco  . Never Used   Counseling given: Not Answered   Outpatient Encounter Prescriptions as of 05/25/2017  Medication Sig  . acetaminophen (TYLENOL) 500 MG tablet Take 500 mg by mouth 2 (two) times daily as needed (for pain.).  [name] albuterol (PROVENTIL HFA;VENTOLIN HFA) 108 (90 BASE) MCG/ACT inhaler Inhale 2 puffs into the lungs every 6 (six)  hours as needed for wheezing or shortness of breath.  [name] albuterol (PROVENTIL) (2.5 MG/3ML) 0.083% nebulizer solution USE 1 VIAL IN NEBULIZER THREE TIMES DAILY AS NEEDED (Patient taking differently: USE 1 VIAL IN NEBULIZER THREE TIMES DAILY AS NEEDED WHEEZING OR SHORTNESS OF BREATH.)  . amLODipine (NORVASC) 10 MG tablet Take 5 mg by mouth at bedtime.  . ARIPiprazole (ABILIFY) 20 MG tablet Take 1 tablet (20 mg total) by mouth at bedtime.  [name] atorvastatin (LIPITOR) 20 MG tablet Take 20 mg by mouth daily after supper.  . bisacodyl (DULCOLAX) 10 MG suppository Place 1 suppository (10 mg total) rectally daily as needed for moderate constipation. (Patient taking differently: Place 10 mg rectally daily as needed for moderate constipation. For constipation.)  . budesonide-formoterol (SYMBICORT) 160-4.5 MCG/ACT inhaler Inhale 2 puffs into the lungs 2 (two)  times daily.  . Cholecalciferol (VITAMIN D3 PO) Take 8,000 Units by mouth every other day.  . donepezil (ARICEPT) 5 MG tablet Take 1 tablet (5 mg total) by mouth at bedtime.  . dorzolamide-timolol (COSOPT) 22.3-6.8 MG/ML ophthalmic solution Place 1 drop into both eyes 2 (two) times daily.  [name] esomeprazole (NEXIUM) 20 MG capsule Take 20 mg by mouth daily as needed (For heartburn or acid reflux.).   [name] glimepiride (AMARYL) 4 MG tablet Take 1 tablet (4 mg total) by mouth daily with breakfast.  . glucose blood (ACCU-CHEK AVIVA) test strip 1 each by Other route 2 (two) times daily.  . hydrOXYzine (ATARAX/VISTARIL) 25 MG tablet Take 50 mg by mouth at bedtime.   . Ipratropium-Albuterol (COMBIVENT) 20-100 MCG/ACT AERS respimat Inhale 1 puff into the lungs every 6 (six) hours.  [name] lactulose (CHRONULAC) 10 GM/15ML solution Take 30 mLs (20 g total) by mouth 2 (two) times daily as needed for mild constipation.  [name] lamoTRIgine (LAMICTAL) 100 MG tablet Take 1 tablet (100 mg total) by mouth every evening. (Patient taking differently: Take 100 mg by mouth daily after supper. )  . latanoprost (XALATAN) 0.005 % ophthalmic solution Place 1 drop into both eyes at bedtime.  . metFORMIN (GLUCOPHAGE) 1000 MG tablet Take 1 tablet (1,000 mg total) by mouth 2 (two) times daily with a meal.  . mirtazapine (REMERON) 7.5 MG tablet Take 1 tablet (7.5 mg total) by mouth at bedtime.  . Multiple Vitamin (MULTIVITAMIN WITH MINERALS) TABS tablet Take 1 tablet by mouth daily.  . ondansetron (ZOFRAN) 4 MG tablet Take 1 tablet (4 mg total) by mouth every 8 (eight) hours as needed for nausea or vomiting.  . OXYGEN Inhale 3-4 L into the lungs continuous. 3 L  CONTINOUSLY & 4 L IF PATIENT IS AMBULARO  . polyethylene glycol (MIRALAX / GLYCOLAX) packet Take 17 g by mouth daily. (Patient taking differently: Take 17 g by mouth daily as needed (for constipation.). )  . Selenium Sulf-Pyrithione-Urea 2.25 % SHAM Apply 1 application topically every  14 (fourteen) days. Applied to scalp for dermatitis.  [name] senna (SENOKOT) 8.6 MG tablet Take 1 tablet by mouth daily as needed for constipation.    No facility-administered encounter medications on file as of 05/25/2017.      Review of Systems  Constitutional:   No  weight loss, night sweats,  Fevers, chills,  +fatigue, or  lassitude.  HEENT:   No headaches,  Difficulty swallowing,  Tooth/dental problems, or  Sore throat,                No sneezing, itching, ear ache, nasal congestion, post nasal drip,   CV:  No chest pain,  Orthopnea, PND, swelling in lower extremities, anasarca, dizziness, palpitations, syncope.   GI  No heartburn, indigestion, abdominal pain, nausea, vomiting, diarrhea, change in bowel habits, loss of appetite, bloody stools.   Resp:   No chest wall deformity  Skin: no rash or lesions.  GU: no dysuria, change in color of urine, no urgency or frequency.  No flank pain, no hematuria   MS:  No joint pain or swelling.  No decreased range of motion.  No back pain.    Physical Exam  BP 118/80 (BP Location: Left Arm, Cuff Size: Normal)   Pulse 88   Ht 5' 3.75" (1.619 m)   Wt 212 lb 9.6 oz (96.4 kg)   SpO2 100%   BMI 36.78 kg/m   GEN: A/Ox3; pleasant , NAD, elderly , in wc on O2    HEENT:  [place]/AT,  EACs-clear, TMs-wnl, NOSE-clear, THROAT-clear, no lesions, no postnasal drip or exudate noted.   NECK:  Supple w/ fair ROM; no JVD; normal carotid impulses w/o bruits; no thyromegaly or nodules palpated; no lymphadenopathy.    RESP  Clear  P & A; w/o, wheezes/ rales/ or rhonchi. no accessory muscle use, no dullness to percussion  CARD:  RRR, no m/r/g, no peripheral edema, pulses intact, no cyanosis or clubbing.  GI:   Soft & nt; nml bowel sounds; no organomegaly or masses detected.   Musco: Warm bil, no deformities or joint swelling noted.   Neuro: alert, no focal deficits noted.    Skin: Warm, no lesions or rashes    Lab Results:    Imaging: Dg Chest  2 View  Result Date: 05/05/2017 CLINICAL DATA:  Dyspnea on exertion. Cough and chest congestion. Wheezing. EXAM: CHEST  2 VIEW COMPARISON:  02/10/2017 and 09/04/2016 FINDINGS: Borderline cardiomegaly. Chronic slight prominence of the main pulmonary artery. Pulmonary vascularity is otherwise normal. No infiltrates or effusions. Tiny linear scarring at the left lung base laterally. No acute bone abnormality.  Calcification in the right rotator cuff. IMPRESSION: New borderline cardiomegaly. Electronically Signed   By: Lorriane Shire M.D.   On: 05/05/2017 09:11   Ct Abdomen W Wo Contrast  Result Date: 05/13/2017 CLINICAL DATA:  Status post cryoablation left renal cell carcinoma on 02/05/2017 EXAM: CT ABDOMEN WITHOUT AND WITH CONTRAST TECHNIQUE: Multidetector CT imaging of the abdomen was performed following the standard protocol before and following the bolus administration of intravenous contrast. CONTRAST:  5m ISOVUE-300 IOPAMIDOL (ISOVUE-300) INJECTION 61% COMPARISON:  Procedure study from 02/05/2017. Diagnostic CT scan 10/01/2016 FINDINGS: Lower chest:  Emphysema. Hepatobiliary: No focal enhancing abnormality within the liver parenchyma. Tiny low-density lesion lateral segment left liver stable since 10/01/2016, likely a cyst. Tiny calcified gallstones evident. No intrahepatic or extrahepatic biliary dilation. Pancreas: No focal mass lesion. No dilatation of the main duct. No intraparenchymal cyst. No peripancreatic edema. Spleen: No splenomegaly. No focal mass lesion. Adrenals/Urinary Tract: No adrenal nodule or mass. Remote upper pole ablation defect left kidney demonstrates interval retraction measuring 2.2 cm today compared to 2.9 cm previously. More recent ablation defect anterior aspect upper pole is at the site of the concerning lesion identified on 10/01/2016. Today, this represents an area of irregular soft tissue attenuation demonstrating hypoenhancement. Imaging features most suggestive of post  ablation scarring/granulation. Multiple cysts are again noted in both kidneys some of which have simple features and the other of which demonstrate increased attenuation on precontrast imaging. No definite enhancing or suspicious mass lesion is identified in either kidney Stomach/Bowel: Tiny  hiatal hernia. Stomach unremarkable. Duodenum is normally positioned as is the ligament of Treitz. Visualize small bowel loops and colonic segments of the abdomen are unremarkable. Vascular/Lymphatic: There is abdominal aortic atherosclerosis without aneurysm. There is no gastrohepatic or hepatoduodenal ligament lymphadenopathy. No intraperitoneal or retroperitoneal lymphadenopathy. Other: No intraperitoneal free fluid. Musculoskeletal: Bone windows reveal no worrisome lytic or sclerotic osseous lesions. IMPRESSION: 1. Status post left renal cryoablation for 2 separate lesions in the upper pole. Expected appearance of the ablation defect at both sites with no features to suggest local recurrence. 2. Multiple bilateral simple renal cysts with other scattered cysts having imaging features suggesting complication by proteinaceous debris or hemorrhage. No overtly suspicious renal lesion on today's exam. 3.  Abdominal Aortic Atherosclerois (ICD10-170.0) 4.  Emphysema. ([id number]) Electronically Signed   By: Misty Stanley M.D.   On: 05/13/2017 14:39     Assessment & Plan:   COPD GOLD 0  Emphysema noted on CT chest  No significant obstruction noted on PFT  Patient's medications were reviewed today and patient education was given. Computerized medication calendar was adjusted/completed   Plan  Patient Instructions  Follow med calendar closely and bring to each visit.  Set up for Echo w/ bubble study .  Continue on Symbicort 2 puffs Twice daily  .  Refer to Pulmonary Rehab.  Continue on oxygen 3/l/m  Follow up with PCP for mammogram and abnormal axillary lymph node in next few weeks.  Follow up in 4-6 weeks with Dr.  Melvyn Novas  And As needed   Please contact office for sooner follow up if symptoms do not improve or worsen or seek emergency care       Chronic respiratory failure with hypoxia ([place]) Persistent hypoxia depsite minimal COPD  Cont on o2  Check echo with bubble study .       Rexene Edison, NP 05/25/2017

## 2017-05-25 NOTE — Assessment & Plan Note (Signed)
Emphysema noted on CT chest  No significant obstruction noted on PFT  Patient's medications were reviewed today and patient education was given. Computerized medication calendar was adjusted/completed   Plan  Patient Instructions  Follow med calendar closely and bring to each visit.  Set up for Echo w/ bubble study .  Continue on Symbicort 2 puffs Twice daily  .  Refer to Pulmonary Rehab.  Continue on oxygen 3/l/m  Follow up with PCP for mammogram and abnormal axillary lymph node in next few weeks.  Follow up in 4-6 weeks with Dr. Melvyn Novas  And As needed   Please contact office for sooner follow up if symptoms do not improve or worsen or seek emergency care

## 2017-05-26 MED ORDER — FLUTTER DEVI: 0 refills | Status: AC

## 2017-05-26 NOTE — Progress Notes (Signed)
Chart and office note reviewed in detail  > agree with a/p as outlined

## 2017-05-26 NOTE — Addendum Note (Signed)
Addended by: Desmond Dike C on: 05/26/2017 03:25 PM   Modules accepted: Orders

## 2017-05-26 NOTE — Addendum Note (Signed)
Addended by: Parke Poisson E on: 05/26/2017 02:08 PM   Modules accepted: Orders

## 2017-05-28 ENCOUNTER — Encounter: Payer: Self-pay | Admitting: Interventional Radiology

## 2017-06-02 ENCOUNTER — Ambulatory Visit: Payer: Medicare Other | Admitting: Podiatry

## 2017-06-02 DIAGNOSIS — J9621 Acute and chronic respiratory failure with hypoxia: Secondary | ICD-10-CM | POA: Diagnosis not present

## 2017-06-02 DIAGNOSIS — J449 Chronic obstructive pulmonary disease, unspecified: Secondary | ICD-10-CM | POA: Diagnosis not present

## 2017-06-08 DIAGNOSIS — J9621 Acute and chronic respiratory failure with hypoxia: Secondary | ICD-10-CM | POA: Diagnosis not present

## 2017-06-08 DIAGNOSIS — J449 Chronic obstructive pulmonary disease, unspecified: Secondary | ICD-10-CM | POA: Diagnosis not present

## 2017-06-09 ENCOUNTER — Other Ambulatory Visit: Payer: Self-pay

## 2017-06-09 ENCOUNTER — Inpatient Hospital Stay (HOSPITAL_COMMUNITY)
Admission: EM | Admit: 2017-06-09 | Discharge: 2017-06-14 | DRG: 286 | Disposition: A | Discharge status: Home Health | Payer: Medicare Other | Attending: Internal Medicine | Admitting: Internal Medicine

## 2017-06-09 ENCOUNTER — Emergency Department (HOSPITAL_COMMUNITY): Payer: Medicare Other

## 2017-06-09 ENCOUNTER — Encounter (HOSPITAL_COMMUNITY): Payer: Self-pay

## 2017-06-09 ENCOUNTER — Ambulatory Visit (HOSPITAL_BASED_OUTPATIENT_CLINIC_OR_DEPARTMENT_OTHER): Payer: Medicare Other

## 2017-06-09 DIAGNOSIS — I248 Other forms of acute ischemic heart disease: Secondary | ICD-10-CM | POA: Diagnosis not present

## 2017-06-09 DIAGNOSIS — N179 Acute kidney failure, unspecified: Secondary | ICD-10-CM | POA: Diagnosis not present

## 2017-06-09 DIAGNOSIS — R7989 Other specified abnormal findings of blood chemistry: Secondary | ICD-10-CM

## 2017-06-09 DIAGNOSIS — I2729 Other secondary pulmonary hypertension: Secondary | ICD-10-CM | POA: Diagnosis present

## 2017-06-09 DIAGNOSIS — I5033 Acute on chronic diastolic (congestive) heart failure: Secondary | ICD-10-CM | POA: Diagnosis not present

## 2017-06-09 DIAGNOSIS — Z885 Allergy status to narcotic agent status: Secondary | ICD-10-CM

## 2017-06-09 DIAGNOSIS — I2609 Other pulmonary embolism with acute cor pulmonale: Secondary | ICD-10-CM

## 2017-06-09 DIAGNOSIS — I2781 Cor pulmonale (chronic): Secondary | ICD-10-CM | POA: Diagnosis not present

## 2017-06-09 DIAGNOSIS — R0682 Tachypnea, not elsewhere classified: Secondary | ICD-10-CM | POA: Diagnosis not present

## 2017-06-09 DIAGNOSIS — R269 Unspecified abnormalities of gait and mobility: Secondary | ICD-10-CM | POA: Diagnosis not present

## 2017-06-09 DIAGNOSIS — C642 Malignant neoplasm of left kidney, except renal pelvis: Secondary | ICD-10-CM | POA: Diagnosis present

## 2017-06-09 DIAGNOSIS — R0602 Shortness of breath: Secondary | ICD-10-CM

## 2017-06-09 DIAGNOSIS — R0609 Other forms of dyspnea: Secondary | ICD-10-CM | POA: Diagnosis not present

## 2017-06-09 DIAGNOSIS — I2789 Other specified pulmonary heart diseases: Secondary | ICD-10-CM | POA: Diagnosis not present

## 2017-06-09 DIAGNOSIS — Z66 Do not resuscitate: Secondary | ICD-10-CM | POA: Diagnosis present

## 2017-06-09 DIAGNOSIS — I272 Pulmonary hypertension, unspecified: Secondary | ICD-10-CM | POA: Diagnosis not present

## 2017-06-09 DIAGNOSIS — Z6834 Body mass index (BMI) 34.0-34.9, adult: Secondary | ICD-10-CM

## 2017-06-09 DIAGNOSIS — E785 Hyperlipidemia, unspecified: Secondary | ICD-10-CM | POA: Diagnosis not present

## 2017-06-09 DIAGNOSIS — I10 Essential (primary) hypertension: Secondary | ICD-10-CM | POA: Diagnosis present

## 2017-06-09 DIAGNOSIS — I130 Hypertensive heart and chronic kidney disease with heart failure and stage 1 through stage 4 chronic kidney disease, or unspecified chronic kidney disease: Principal | ICD-10-CM | POA: Diagnosis present

## 2017-06-09 DIAGNOSIS — E1122 Type 2 diabetes mellitus with diabetic chronic kidney disease: Secondary | ICD-10-CM | POA: Diagnosis not present

## 2017-06-09 DIAGNOSIS — J449 Chronic obstructive pulmonary disease, unspecified: Secondary | ICD-10-CM | POA: Diagnosis present

## 2017-06-09 DIAGNOSIS — N183 Chronic kidney disease, stage 3 unspecified: Secondary | ICD-10-CM | POA: Diagnosis present

## 2017-06-09 DIAGNOSIS — Z85528 Personal history of other malignant neoplasm of kidney: Secondary | ICD-10-CM

## 2017-06-09 DIAGNOSIS — Z90710 Acquired absence of both cervix and uterus: Secondary | ICD-10-CM

## 2017-06-09 DIAGNOSIS — F329 Major depressive disorder, single episode, unspecified: Secondary | ICD-10-CM | POA: Diagnosis present

## 2017-06-09 DIAGNOSIS — G8929 Other chronic pain: Secondary | ICD-10-CM | POA: Diagnosis present

## 2017-06-09 DIAGNOSIS — K219 Gastro-esophageal reflux disease without esophagitis: Secondary | ICD-10-CM | POA: Diagnosis present

## 2017-06-09 DIAGNOSIS — F32A Depression, unspecified: Secondary | ICD-10-CM | POA: Diagnosis present

## 2017-06-09 DIAGNOSIS — Z9981 Dependence on supplemental oxygen: Secondary | ICD-10-CM | POA: Diagnosis not present

## 2017-06-09 DIAGNOSIS — Z888 Allergy status to other drugs, medicaments and biological substances status: Secondary | ICD-10-CM

## 2017-06-09 DIAGNOSIS — J9621 Acute and chronic respiratory failure with hypoxia: Secondary | ICD-10-CM | POA: Diagnosis present

## 2017-06-09 DIAGNOSIS — F319 Bipolar disorder, unspecified: Secondary | ICD-10-CM | POA: Diagnosis present

## 2017-06-09 DIAGNOSIS — Z981 Arthrodesis status: Secondary | ICD-10-CM

## 2017-06-09 DIAGNOSIS — F259 Schizoaffective disorder, unspecified: Secondary | ICD-10-CM | POA: Diagnosis present

## 2017-06-09 DIAGNOSIS — E119 Type 2 diabetes mellitus without complications: Secondary | ICD-10-CM

## 2017-06-09 DIAGNOSIS — J9620 Acute and chronic respiratory failure, unspecified whether with hypoxia or hypercapnia: Secondary | ICD-10-CM | POA: Diagnosis present

## 2017-06-09 DIAGNOSIS — Z7951 Long term (current) use of inhaled steroids: Secondary | ICD-10-CM

## 2017-06-09 DIAGNOSIS — H409 Unspecified glaucoma: Secondary | ICD-10-CM | POA: Diagnosis not present

## 2017-06-09 DIAGNOSIS — Z87891 Personal history of nicotine dependence: Secondary | ICD-10-CM

## 2017-06-09 DIAGNOSIS — R778 Other specified abnormalities of plasma proteins: Secondary | ICD-10-CM

## 2017-06-09 DIAGNOSIS — Z833 Family history of diabetes mellitus: Secondary | ICD-10-CM

## 2017-06-09 DIAGNOSIS — E875 Hyperkalemia: Secondary | ICD-10-CM | POA: Diagnosis present

## 2017-06-09 DIAGNOSIS — I2699 Other pulmonary embolism without acute cor pulmonale: Secondary | ICD-10-CM | POA: Diagnosis not present

## 2017-06-09 DIAGNOSIS — R05 Cough: Secondary | ICD-10-CM | POA: Diagnosis not present

## 2017-06-09 DIAGNOSIS — M545 Low back pain: Secondary | ICD-10-CM | POA: Diagnosis present

## 2017-06-09 DIAGNOSIS — R0600 Dyspnea, unspecified: Secondary | ICD-10-CM | POA: Diagnosis present

## 2017-06-09 DIAGNOSIS — R197 Diarrhea, unspecified: Secondary | ICD-10-CM | POA: Diagnosis present

## 2017-06-09 DIAGNOSIS — J439 Emphysema, unspecified: Secondary | ICD-10-CM | POA: Diagnosis not present

## 2017-06-09 DIAGNOSIS — R918 Other nonspecific abnormal finding of lung field: Secondary | ICD-10-CM | POA: Diagnosis not present

## 2017-06-09 DIAGNOSIS — Z820 Family history of epilepsy and other diseases of the nervous system: Secondary | ICD-10-CM

## 2017-06-09 DIAGNOSIS — J9600 Acute respiratory failure, unspecified whether with hypoxia or hypercapnia: Secondary | ICD-10-CM

## 2017-06-09 DIAGNOSIS — J441 Chronic obstructive pulmonary disease with (acute) exacerbation: Secondary | ICD-10-CM | POA: Diagnosis present

## 2017-06-09 DIAGNOSIS — R748 Abnormal levels of other serum enzymes: Secondary | ICD-10-CM

## 2017-06-09 DIAGNOSIS — Z7984 Long term (current) use of oral hypoglycemic drugs: Secondary | ICD-10-CM

## 2017-06-09 DIAGNOSIS — Z905 Acquired absence of kidney: Secondary | ICD-10-CM

## 2017-06-09 DIAGNOSIS — Z8249 Family history of ischemic heart disease and other diseases of the circulatory system: Secondary | ICD-10-CM

## 2017-06-09 DIAGNOSIS — Z79899 Other long term (current) drug therapy: Secondary | ICD-10-CM

## 2017-06-09 HISTORY — DX: Acute respiratory failure, unspecified whether with hypoxia or hypercapnia: J96.00

## 2017-06-09 LAB — URINALYSIS, ROUTINE W REFLEX MICROSCOPIC
BILIRUBIN URINE: NEGATIVE
Glucose, UA: NEGATIVE mg/dL
Hgb urine dipstick: NEGATIVE
Ketones, ur: NEGATIVE mg/dL
Leukocytes, UA: NEGATIVE
NITRITE: NEGATIVE
Protein, ur: 100 mg/dL — AB
SPECIFIC GRAVITY, URINE: 1.005 (ref 1.005–1.030)
pH: 5 (ref 5.0–8.0)

## 2017-06-09 LAB — CBC WITH DIFFERENTIAL/PLATELET
Basophils Absolute: 0 10*3/uL (ref 0.0–0.1)
Basophils Relative: 0 %
EOS PCT: 1 %
Eosinophils Absolute: 0.1 10*3/uL (ref 0.0–0.7)
HEMATOCRIT: 34.3 % — AB (ref 36.0–46.0)
Hemoglobin: 10.6 g/dL — ABNORMAL LOW (ref 12.0–15.0)
Lymphocytes Relative: 19 %
Lymphs Abs: 1.5 10*3/uL (ref 0.7–4.0)
MCH: 24.1 pg — ABNORMAL LOW (ref 26.0–34.0)
MCHC: 30.9 g/dL (ref 30.0–36.0)
MCV: 78 fL (ref 78.0–100.0)
MONO ABS: 0.5 10*3/uL (ref 0.1–1.0)
MONOS PCT: 7 %
NEUTROS ABS: 5.7 10*3/uL (ref 1.7–7.7)
Neutrophils Relative %: 73 %
Platelets: 393 10*3/uL (ref 150–400)
RBC: 4.4 MIL/uL (ref 3.87–5.11)
RDW: 15.9 % — AB (ref 11.5–15.5)
WBC: 7.7 10*3/uL (ref 4.0–10.5)

## 2017-06-09 LAB — C DIFFICILE QUICK SCREEN W PCR REFLEX
C DIFFICILE (CDIFF) TOXIN: NEGATIVE
C Diff antigen: NEGATIVE
C Diff interpretation: NOT DETECTED

## 2017-06-09 LAB — COMPREHENSIVE METABOLIC PANEL
ALBUMIN: 3.9 g/dL (ref 3.5–5.0)
ALK PHOS: 63 U/L (ref 38–126)
ALT: 48 U/L (ref 14–54)
AST: 40 U/L (ref 15–41)
Anion gap: 10 (ref 5–15)
BILIRUBIN TOTAL: 0.7 mg/dL (ref 0.3–1.2)
BUN: 35 mg/dL — AB (ref 6–20)
CALCIUM: 9.8 mg/dL (ref 8.9–10.3)
CO2: 17 mmol/L — ABNORMAL LOW (ref 22–32)
CREATININE: 1.57 mg/dL — AB (ref 0.44–1.00)
Chloride: 108 mmol/L (ref 101–111)
GFR calc Af Amer: 39 mL/min — ABNORMAL LOW (ref >60)
GFR calc non Af Amer: 33 mL/min — ABNORMAL LOW (ref >60)
GLUCOSE: 58 mg/dL — AB (ref 65–99)
POTASSIUM: 4.8 mmol/L (ref 3.5–5.1)
Sodium: 135 mmol/L (ref 135–145)
Total Protein: 6.7 g/dL (ref 6.5–8.1)

## 2017-06-09 LAB — BRAIN NATRIURETIC PEPTIDE: B Natriuretic Peptide: 877.1 pg/mL — ABNORMAL HIGH (ref 0.0–100.0)

## 2017-06-09 LAB — TROPONIN I
Troponin I: 0.09 ng/mL (ref <0.03)
Troponin I: 0.08 ng/mL (ref <0.03)

## 2017-06-09 MED ORDER — AMLODIPINE BESYLATE 5 MG PO TABS
5.0000 mg | ORAL_TABLET | Freq: Every day | ORAL | Status: DC
  Administered 2017-06-10 – 2017-06-13 (×4): 5 mg via ORAL
  Filled 2017-06-09 (×4): qty 1

## 2017-06-09 MED ORDER — IPRATROPIUM BROMIDE 0.02 % IN SOLN: 0.5000 mg | Freq: Four times a day (QID) | RESPIRATORY_TRACT | Status: DC

## 2017-06-09 MED ORDER — ALBUTEROL SULFATE (2.5 MG/3ML) 0.083% IN NEBU: 2.5000 mg | INHALATION_SOLUTION | RESPIRATORY_TRACT | Status: DC | PRN

## 2017-06-09 MED ORDER — METHYLPREDNISOLONE SODIUM SUCC 125 MG IJ SOLR
125.0000 mg | Freq: Once | INTRAMUSCULAR | Status: AC
  Administered 2017-06-09: 125 mg via INTRAVENOUS
  Filled 2017-06-09: qty 2

## 2017-06-09 MED ORDER — FUROSEMIDE 10 MG/ML IJ SOLN: 40.0000 mg | Freq: Two times a day (BID) | INTRAMUSCULAR | Status: DC

## 2017-06-09 MED ORDER — IPRATROPIUM-ALBUTEROL 0.5-2.5 (3) MG/3ML IN SOLN
3.0000 mL | Freq: Four times a day (QID) | RESPIRATORY_TRACT | Status: DC
  Administered 2017-06-10 (×2): 3 mL via RESPIRATORY_TRACT
  Filled 2017-06-09 (×2): qty 3

## 2017-06-09 MED ORDER — MOMETASONE FURO-FORMOTEROL FUM 200-5 MCG/ACT IN AERO: 2.0000 | INHALATION_SPRAY | Freq: Two times a day (BID) | RESPIRATORY_TRACT | Status: DC

## 2017-06-09 MED ORDER — ATORVASTATIN CALCIUM 20 MG PO TABS
20.0000 mg | ORAL_TABLET | Freq: Every day | ORAL | Status: DC
  Administered 2017-06-10 – 2017-06-13 (×4): 20 mg via ORAL
  Filled 2017-06-09 (×5): qty 1

## 2017-06-09 MED ORDER — ACETAMINOPHEN 325 MG PO TABS
650.0000 mg | ORAL_TABLET | Freq: Four times a day (QID) | ORAL | Status: DC | PRN
  Administered 2017-06-11 – 2017-06-12 (×2): 650 mg via ORAL
  Filled 2017-06-09 (×2): qty 2

## 2017-06-09 MED ORDER — ACETAMINOPHEN 650 MG RE SUPP: 650.0000 mg | Freq: Four times a day (QID) | RECTAL | Status: DC | PRN

## 2017-06-09 MED ORDER — DONEPEZIL HCL 5 MG PO TABS
5.0000 mg | ORAL_TABLET | Freq: Every day | ORAL | Status: DC
  Administered 2017-06-09 – 2017-06-13 (×5): 5 mg via ORAL
  Filled 2017-06-09 (×5): qty 1

## 2017-06-09 MED ORDER — ISOSORBIDE MONONITRATE ER 60 MG PO TB24
60.0000 mg | ORAL_TABLET | Freq: Every day | ORAL | Status: DC
  Administered 2017-06-10 – 2017-06-11 (×2): 60 mg via ORAL
  Filled 2017-06-09 (×2): qty 1

## 2017-06-09 MED ORDER — IOPAMIDOL (ISOVUE-370) INJECTION 76%
80.0000 mL | Freq: Once | INTRAVENOUS | Status: AC | PRN
  Administered 2017-06-09: 100 mL via INTRAVENOUS

## 2017-06-09 MED ORDER — IPRATROPIUM-ALBUTEROL 0.5-2.5 (3) MG/3ML IN SOLN
3.0000 mL | Freq: Once | RESPIRATORY_TRACT | Status: AC
  Administered 2017-06-09: 3 mL via RESPIRATORY_TRACT
  Filled 2017-06-09: qty 3

## 2017-06-09 MED ORDER — MAGNESIUM 200 MG PO TABS
1.0000 | ORAL_TABLET | ORAL | Status: DC
  Filled 2017-06-09: qty 1

## 2017-06-09 MED ORDER — INSULIN ASPART 100 UNIT/ML ~~LOC~~ SOLN
0.0000 [IU] | Freq: Three times a day (TID) | SUBCUTANEOUS | Status: DC
  Administered 2017-06-10: 3 [IU] via SUBCUTANEOUS
  Administered 2017-06-10: 2 [IU] via SUBCUTANEOUS
  Administered 2017-06-10: 3 [IU] via SUBCUTANEOUS
  Administered 2017-06-11: 2 [IU] via SUBCUTANEOUS
  Administered 2017-06-12: 9 [IU] via SUBCUTANEOUS
  Administered 2017-06-12: 2 [IU] via SUBCUTANEOUS
  Administered 2017-06-13: 5 [IU] via SUBCUTANEOUS
  Administered 2017-06-13 – 2017-06-14 (×2): 3 [IU] via SUBCUTANEOUS

## 2017-06-09 MED ORDER — SENNA 8.6 MG PO TABS: 1.0000 | ORAL_TABLET | Freq: Every day | ORAL | Status: DC | PRN

## 2017-06-09 MED ORDER — ARIPIPRAZOLE 5 MG PO TABS
20.0000 mg | ORAL_TABLET | Freq: Every day | ORAL | Status: DC
  Administered 2017-06-10 – 2017-06-13 (×5): 20 mg via ORAL
  Filled 2017-06-09 (×5): qty 4

## 2017-06-09 MED ORDER — POLYETHYLENE GLYCOL 3350 17 G PO PACK: 17.0000 g | PACK | Freq: Every day | ORAL | Status: DC | PRN

## 2017-06-09 MED ORDER — DORZOLAMIDE HCL-TIMOLOL MAL 2-0.5 % OP SOLN
1.0000 [drp] | Freq: Two times a day (BID) | OPHTHALMIC | Status: DC
  Administered 2017-06-09 – 2017-06-14 (×10): 1 [drp] via OPHTHALMIC
  Filled 2017-06-09: qty 10

## 2017-06-09 MED ORDER — ALBUTEROL SULFATE (2.5 MG/3ML) 0.083% IN NEBU: 2.5000 mg | INHALATION_SOLUTION | Freq: Four times a day (QID) | RESPIRATORY_TRACT | Status: DC

## 2017-06-09 MED ORDER — INSULIN ASPART 100 UNIT/ML ~~LOC~~ SOLN
0.0000 [IU] | Freq: Every day | SUBCUTANEOUS | Status: DC
  Administered 2017-06-12 – 2017-06-13 (×2): 2 [IU] via SUBCUTANEOUS

## 2017-06-09 MED ORDER — ASPIRIN EC 81 MG PO TBEC
81.0000 mg | DELAYED_RELEASE_TABLET | Freq: Every day | ORAL | Status: DC
  Administered 2017-06-10 – 2017-06-14 (×5): 81 mg via ORAL
  Filled 2017-06-09 (×5): qty 1

## 2017-06-09 MED ORDER — FUROSEMIDE 10 MG/ML IJ SOLN: 40.0000 mg | Freq: Three times a day (TID) | INTRAMUSCULAR | Status: DC

## 2017-06-09 MED ORDER — HEPARIN SODIUM (PORCINE) 5000 UNIT/ML IJ SOLN
5000.0000 [IU] | Freq: Three times a day (TID) | INTRAMUSCULAR | Status: DC
  Administered 2017-06-09 – 2017-06-14 (×13): 5000 [IU] via SUBCUTANEOUS
  Filled 2017-06-09 (×13): qty 1

## 2017-06-09 MED ORDER — AMLODIPINE BESYLATE 5 MG PO TABS
5.0000 mg | ORAL_TABLET | Freq: Every day | ORAL | Status: DC
  Administered 2017-06-09 – 2017-06-11 (×3): 5 mg via ORAL
  Filled 2017-06-09 (×3): qty 1

## 2017-06-09 MED ORDER — PANTOPRAZOLE SODIUM 40 MG PO TBEC
40.0000 mg | DELAYED_RELEASE_TABLET | Freq: Every day | ORAL | Status: DC
  Administered 2017-06-10 – 2017-06-14 (×5): 40 mg via ORAL
  Filled 2017-06-09 (×5): qty 1

## 2017-06-09 MED ORDER — MOMETASONE FURO-FORMOTEROL FUM 200-5 MCG/ACT IN AERO
2.0000 | INHALATION_SPRAY | Freq: Two times a day (BID) | RESPIRATORY_TRACT | Status: DC
  Administered 2017-06-10 – 2017-06-14 (×7): 2 via RESPIRATORY_TRACT
  Filled 2017-06-09 (×2): qty 8.8

## 2017-06-09 MED ORDER — LATANOPROST 0.005 % OP SOLN
1.0000 [drp] | Freq: Every day | OPHTHALMIC | Status: DC
  Administered 2017-06-09 – 2017-06-13 (×5): 1 [drp] via OPHTHALMIC
  Filled 2017-06-09: qty 2.5

## 2017-06-09 MED ORDER — LAMOTRIGINE 25 MG PO TABS
100.0000 mg | ORAL_TABLET | Freq: Every evening | ORAL | Status: DC
  Administered 2017-06-10 – 2017-06-13 (×4): 100 mg via ORAL
  Filled 2017-06-09 (×4): qty 4

## 2017-06-09 MED ORDER — MIRTAZAPINE 15 MG PO TABS
7.5000 mg | ORAL_TABLET | Freq: Every day | ORAL | Status: DC
  Administered 2017-06-09 – 2017-06-13 (×5): 7.5 mg via ORAL
  Filled 2017-06-09 (×5): qty 1

## 2017-06-09 MED ORDER — ASPIRIN EC 325 MG PO TBEC
325.0000 mg | DELAYED_RELEASE_TABLET | Freq: Once | ORAL | Status: AC
  Administered 2017-06-09: 325 mg via ORAL
  Filled 2017-06-09: qty 1

## 2017-06-09 MED ORDER — METHYLPREDNISOLONE SODIUM SUCC 125 MG IJ SOLR
60.0000 mg | Freq: Three times a day (TID) | INTRAMUSCULAR | Status: DC
  Administered 2017-06-09 – 2017-06-10 (×2): 60 mg via INTRAVENOUS
  Filled 2017-06-09 (×2): qty 2

## 2017-06-09 MED ORDER — HYDROXYZINE HCL 25 MG PO TABS
50.0000 mg | ORAL_TABLET | Freq: Every day | ORAL | Status: DC
  Administered 2017-06-09 – 2017-06-13 (×5): 50 mg via ORAL
  Filled 2017-06-09 (×5): qty 2

## 2017-06-09 MED ORDER — FUROSEMIDE 10 MG/ML IJ SOLN
40.0000 mg | Freq: Once | INTRAMUSCULAR | Status: AC
  Administered 2017-06-09: 40 mg via INTRAVENOUS
  Filled 2017-06-09: qty 4

## 2017-06-09 NOTE — ED Notes (Signed)
Pt has had 2 loose watery stools since being in ED. Placed on bedpan for a 3rd. Pt reports started this AM, had several at home.

## 2017-06-09 NOTE — ED Provider Notes (Signed)
[facility] DEPT Provider Note   CSN: [id number] Arrival date & time: 06/09/17  1339     History   Chief Complaint Chief Complaint  Patient presents with  . Shortness of Breath  . PE rule out    HPI Elizabeth Thompson is a 66 y.o. female.  Pt presents to the ED today with sob.   The pt said that she has sob for the past year, but it is progressively getting worse.  The pt said that she does wear home O2 at 3L.  She had an echo today which showed cor pulmonale and severe pulmonary HTN.  She was sent to the ED to r/o PE.  Pt also has severe diarrhea.  She denies f/c.  O2 sats drop with any movement.       Past Medical History:  Diagnosis Date  . Arthritis   . Back pain   . Bell's palsy   . Bipolar disorder ([place])   . Bronchitis   . Chronic low back pain 05/09/2015  . COPD (chronic obstructive pulmonary disease) ([place])    hyoxia during sleep- using O2 as needed  . Cough with expectoration 05/21/16   recent diagnosis of bronchitis  . Cyst of right kidney   . Diabetes mellitus without complication ([place])    type 2  . Frequency of urination   . GERD (gastroesophageal reflux disease)   . Glaucoma   . History of blood transfusion   . History of colon polyps   . History of hiatal hernia   . History of tobacco use   . Hypercalcemia   . Hyperlipidemia   . Hypertension   . Memory disorder 09/05/2014  . On home oxygen therapy    3L/M [place] at night   . Schizo-affective psychosis ([place])   . Shortness of breath dyspnea    exertion, or with out oxygen  . Uterine fibroid     Patient Active Problem List   Diagnosis Date Noted  . Chronic renal disease, stage III 05/05/2017  . Chronic respiratory failure with hypoxia ([place]) 05/05/2017  . Renal cell carcinoma, left ([place]) 02/05/2017  . Dyspnea on exertion 12/28/2016  . Depression 09/04/2016  . Renal malignant tumor ([place]) 06/26/2016  . GERD (gastroesophageal reflux disease) 03/22/2016  . Insomnia 03/22/2016  . COPD GOLD 0   12/06/2015  . Essential hypertension 12/06/2015  . Multiple lung nodules 11/20/2015  . Morbid obesity due to excess calories ([place]) 11/20/2015  . Hypersomnia with sleep apnea 11/20/2015  . Diabetes mellitus type 2, controlled, without complications ([place]) 58/85/0277  . HLD (hyperlipidemia) 11/06/2015  . Chronic low back pain 05/09/2015  . Gait disorder 05/09/2015  . Memory disorder 09/05/2014  . Schizoaffective disorder ([place]) 08/18/2014    Past Surgical History:  Procedure Laterality Date  . ABDOMINAL HYSTERECTOMY    . APPENDECTOMY    . COLONOSCOPY W/ POLYPECTOMY    . IR GENERIC HISTORICAL  04/29/2016   IR RADIOLOGIST EVAL & MGMT 04/29/2016 Corrie Mckusick, DO [facility] INTERV RAD  . IR GENERIC HISTORICAL  10/01/2016   IR RADIOLOGIST EVAL & MGMT 10/01/2016 [facility] INTERV RAD  . IR GENERIC HISTORICAL  11/11/2016   IR RADIOLOGIST EVAL & MGMT 11/11/2016 Corrie Mckusick, DO [facility] INTER[facility]  . IR GENERIC HISTORICAL  07/16/2016   IR RADIOLOGIST EVAL & MGMT 07/16/2016 Corrie Mckusick, DO [facility] INTER[facility] RAD  . IR RADIOLOGIST EVAL & MGMT  03/10/2017  . IR RADIOLOGIST EVAL & MGMT  05/13/2017  . LUMBAR LAMINECTOMY/DECOMPRESSION MICRODISCECTOMY N/A 08/14/2015  Procedure: LUMBAR DECOMPRESSION MICRODISCECTOMY L3-S1;  Surgeon: Melina Schools, MD;  Location: [facility];  Service: Orthopedics;  Laterality: N/A;  . RADIOFREQUENCY ABLATION Left 02/05/2017   Procedure: LEFT RENAL CRYOABLATION;  Surgeon: Corrie Mckusick, DO;  Location: [facility];  Service: Anesthesiology;  Laterality: Left;  . TONSILLECTOMY AND ADENOIDECTOMY      OB History    No data available       Home Medications    Prior to Admission medications   Medication Sig Start Date End Date Taking? Authorizing Provider  Acetaminophen (TYLENOL ARTHRITIS PAIN PO) Take by mouth. Take per bottle instructions as needed    [provider]  albuterol (PROVENTIL) (2.5 MG/3ML) 0.083% nebulizer solution Take 2.5 mg by nebulization every 4 (four) hours as needed  for wheezing or shortness of breath. PLAN C    [provider]  amLODipine (NORVASC) 10 MG tablet Take 5 mg by mouth at bedtime.    [provider]  ARIPiprazole (ABILIFY) 20 MG tablet Take 1 tablet (20 mg total) by mouth at bedtime. 08/27/14   Kerrie Buffalo, NP  atorvastatin (LIPITOR) 20 MG tablet Take 20 mg by mouth daily after supper.    [provider]  bisacodyl (DULCOLAX) 10 MG suppository Place 1 suppository (10 mg total) rectally daily as needed for moderate constipation. Patient taking differently: Place 10 mg rectally daily as needed for moderate constipation. For constipation. 09/08/16   Geradine Girt, DO  budesonide-formoterol (SYMBICORT) 160-4.5 MCG/ACT inhaler Inhale 2 puffs into the lungs 2 (two) times daily.    [provider]  Coenzyme Q10 (COQ10 PO) Take 1 tablet by mouth every other day.    [provider]  dextromethorphan-guaiFENesin (MUCINEX DM) 30-600 MG 12hr tablet Take 1 tablet daily as needed with flutter valve    [provider]  donepezil (ARICEPT) 5 MG tablet Take 1 tablet (5 mg total) by mouth at bedtime. 08/27/14   Kerrie Buffalo, NP  dorzolamide-timolol (COSOPT) 22.3-6.8 MG/ML ophthalmic solution Place 1 drop into both eyes 2 (two) times daily.    [provider]  glimepiride (AMARYL) 4 MG tablet Take 1 tablet (4 mg total) by mouth daily with breakfast. 08/27/14   Kerrie Buffalo, NP  glucose blood (ACCU-CHEK AVIVA) test strip 1 each by Other route 2 (two) times daily. 08/04/16   Jearld Fenton, NP  hydrOXYzine (ATARAX/VISTARIL) 25 MG tablet Take 50 mg by mouth at bedtime.  04/17/16   [provider]  Ipratropium-Albuterol (COMBIVENT RESPIMAT) 20-100 MCG/ACT AERS respimat Inhale 1 puff into the lungs every 4 (four) hours as needed for wheezing. PLAN B    [provider]  lactulose (CHRONULAC) 10 GM/15ML solution Take 30 mLs (20 g total) by mouth 2 (two) times daily as needed for mild  constipation. 09/08/16   Geradine Girt, DO  lamoTRIgine (LAMICTAL) 100 MG tablet Take 1 tablet (100 mg total) by mouth every evening. Patient taking differently: Take 100 mg by mouth daily after supper.  08/27/14   Kerrie Buffalo, NP  latanoprost (XALATAN) 0.005 % ophthalmic solution Place 1 drop into both eyes at bedtime. 08/27/14   Kerrie Buffalo, NP  Magnesium 250 MG TABS Take 1 tablet by mouth every other day.    [provider]  metFORMIN (GLUCOPHAGE) 1000 MG tablet Take 1 tablet (1,000 mg total) by mouth 2 (two) times daily with a meal. 08/27/14   Kerrie Buffalo, NP  mirtazapine (REMERON) 7.5 MG tablet Take 1 tablet (7.5 mg total) by mouth at  bedtime. 08/27/14   Kerrie Buffalo, NP  OXYGEN Inhale 3-4 L into the lungs continuous. 3 L  CONTINOUSLY & 4 L IF PATIENT IS AMBULARO    [provider]  polyethylene glycol (MIRALAX / GLYCOLAX) packet Take 17 g by mouth daily. Patient taking differently: Take 17 g by mouth daily as needed (for constipation.).  09/08/16   Geradine Girt, DO  Respiratory Therapy Supplies (FLUTTER) DEVI Use as directed. 05/26/17   Parrett, Fonnie Mu, NP  Selenium Sulf-Pyrithione-Urea 2.25 % SHAM Apply 1 application topically every 14 (fourteen) days. Applied to scalp for dermatitis. 01/25/15   [provider]  senna (SENOKOT) 8.6 MG tablet Take 1 tablet by mouth daily as needed for constipation.     [provider]    Family History Family History  Problem Relation Age of Onset  . Dementia Mother   . Alzheimer's disease Mother   . Heart disease Mother   . Hypertension Mother   . Diabetes Mother   . Diabetes Father   . Alzheimer's disease Sister   . Heart disease Brother   . Heart attack Brother   . Bladder Cancer Neg Hx   . Kidney cancer Neg Hx   . Prostate cancer Neg Hx     Social History Social History  Substance Use Topics  . Smoking status: Former Smoker    Packs/day: 1.00    Years: 28.00    Types: Cigarettes    Quit  date: 07/15/2015  . Smokeless tobacco: Never Used  . Alcohol use No     Allergies   Codeine and Prednisone   Review of Systems Review of Systems  Respiratory: Positive for shortness of breath.   Gastrointestinal: Positive for diarrhea.  All other systems reviewed and are negative.    Physical Exam Updated Vital Signs BP (!) 137/96   Pulse 94   Temp 98.1 F (36.7 C) (Oral)   Resp 18   SpO2 92%   Physical Exam  Constitutional: She is oriented to person, place, and time. She appears well-developed and well-nourished.  HENT:  Head: Normocephalic and atraumatic.  Right Ear: External ear normal.  Left Ear: External ear normal.  Nose: Nose normal.  Mouth/Throat: Oropharynx is clear and moist.  Eyes: Conjunctivae and EOM are normal. Pupils are equal, round, and reactive to light.  Neck: Normal range of motion. Neck supple.  Cardiovascular: Normal rate, regular rhythm, normal heart sounds and intact distal pulses.   Pulmonary/Chest: Tachypnea noted. She is in respiratory distress.  Abdominal: Soft. Bowel sounds are normal.  Musculoskeletal: Normal range of motion.  Neurological: She is alert and oriented to person, place, and time.  Skin: Skin is warm.  Psychiatric: She has a normal mood and affect. Her behavior is normal. Judgment and thought content normal.  Nursing note and vitals reviewed.    ED Treatments / Results  Labs (all labs ordered are listed, but only abnormal results are displayed) Labs Reviewed  COMPREHENSIVE METABOLIC PANEL - Abnormal; Notable for the following:       Result Value   CO2 17 (*)    Glucose, Bld 58 (*)    BUN 35 (*)    Creatinine, Ser 1.57 (*)    GFR calc non Af Amer 33 (*)    GFR calc Af Amer 39 (*)    All other components within normal limits  TROPONIN I - Abnormal; Notable for the following:    Troponin I 0.09 (*)    All other components within  normal limits  CBC WITH DIFFERENTIAL/PLATELET - Abnormal; Notable for the following:     Hemoglobin 10.6 (*)    HCT 34.3 (*)    MCH 24.1 (*)    RDW 15.9 (*)    All other components within normal limits  URINALYSIS, ROUTINE W REFLEX MICROSCOPIC - Abnormal; Notable for the following:    Color, Urine COLORLESS (*)    Protein, ur 100 (*)    Bacteria, UA RARE (*)    Squamous Epithelial / LPF 0-5 (*)    All other components within normal limits  C DIFFICILE QUICK SCREEN W PCR REFLEX  GASTROINTESTINAL PANEL BY PCR, STOOL (REPLACES STOOL CULTURE)  BRAIN NATRIURETIC PEPTIDE    EKG  EKG Interpretation  Date/Time:  Wednesday June 09 2017 14:01:21 EDT Ventricular Rate:  92 PR Interval:    QRS Duration: 88 QT Interval:  414 QTC Calculation: 513 R Axis:   121 Text Interpretation:  Sinus rhythm Right axis deviation Borderline T abnormalities, anterior leads Prolonged QT interval Confirmed by Isla Pence [id number]) on 06/09/2017 2:08:33 PM       Radiology Ct Angio Chest Pe W And/or Wo Contrast  Result Date: 06/09/2017 CLINICAL DATA:  66 year old female with shortness of breath. Personal history left renal cell carcinoma status post cryoablation in 2017. EXAM: CT ANGIOGRAPHY CHEST WITH CONTRAST TECHNIQUE: Multidetector CT imaging of the chest was performed using the standard protocol during bolus administration of intravenous contrast. Multiplanar CT image reconstructions and MIPs were obtained to evaluate the vascular anatomy. CONTRAST:  80 mL Isovue 370 COMPARISON:  Chest radiographs 05/04/2017. Chest CT 11/10/2016. Chest CTA 02/11/2016. Restaging CT Abdomen 05/13/2017. FINDINGS: Cardiovascular: Good contrast bolus timing in the pulmonary arterial tree. Mild respiratory motion artifact in the costophrenic angles. No focal filling defect identified in the pulmonary arteries to suggest acute pulmonary embolism. Mild cardiomegaly. No pericardial effusion. No aortic contrast today. Calcified aortic and coronary artery atherosclerosis is evident. Mediastinum/Nodes: Small hilar and  maximal mediastinal lymph nodes are stable since February 2017, nonspecific. No new or increased lymphadenopathy. Lungs/Pleura: Centrilobular emphysema. Mild septal thickening in the lung apices. Atelectatic changes to the major airways. Mosaic attenuation in the lower lobes. No consolidation. No pleural effusion. Upper Abdomen: Negative visualized liver, spleen, pancreas and bowel in the upper abdomen. Visible renal nodularity appears stable. Musculoskeletal: Upper thoracic spina bifida occulta , normal variant. L1 spina bifida occulta. No acute osseous abnormality identified. Review of the MIP images confirms the above findings. IMPRESSION: 1.  No evidence of acute pulmonary embolus. 2. Emphysema with superimposed mild septal thickening and ground-glass opacity/mosaic attenuation. Consider mild or developing interstitial edema versus acute viral/atypical respiratory infection. 3. Superimposed mild chronic mediastinal and hilar lymphadenopathy is probably postinflammatory. Thoracic sarcoidosis is a less likely possibility. 4. Cardiomegaly.  No pericardial or pleural effusion. 5. Aortic Atherosclerosis (ICD10-I70.0) and Emphysema (ICD10-J43.9). Electronically Signed   By: Genevie Ann M.D.   On: 06/09/2017 17:04    Procedures Procedures (including critical care time)  Medications Ordered in ED Medications  aspirin EC tablet 325 mg (not administered)  furosemide (LASIX) injection 40 mg (not administered)  ipratropium-albuterol (DUONEB) 0.5-2.5 (3) MG/3ML nebulizer solution 3 mL (not administered)  iopamidol (ISOVUE-370) 76 % injection 80 mL (100 mLs Intravenous Contrast Given 06/09/17 1621)     Initial Impression / Assessment and Plan / ED Course  I have reviewed the triage vital signs and the nursing notes.  Pertinent labs & imaging results that were available during my care of the patient were  reviewed by me and considered in my medical decision making (see chart for details).    O2 sats only 87 on  4L laying in bed.  She was put on 5 L.  No PE on Ct scan.   O2 drops with any movement.  Pt had diarrhea here, and samples were sent for c.diff and other etiologies.    c.diff neg.  Other etiologies pending.  Pt d/w hospitalists for admission.   Final Clinical Impressions(s) / ED Diagnoses   Final diagnoses:  COPD exacerbation ([place])  Pulmonary HTN ([place])  Cor pulmonale, acute ([place])  Elevated troponin  Acute on chronic respiratory failure with hypoxia [facility])    New Prescriptions New Prescriptions   No medications on file     Isla Pence, MD 06/09/17 1724

## 2017-06-09 NOTE — Consult Note (Addendum)
CARDIOLOGY CONSULT NOTE  Patient ID: Elizabeth Thompson MRN: [id number] DOB/AGE: 18-Oct-1951 3 y.o.  Admit date: 06/09/2017 Referring Physician  Phillips Climes, MD Primary Physician:  Everardo Beals, NP Reason for Consultation  CHF  HPI: Elizabeth Thompson  is a 66 y.o. female Patient with severe COPD and oxygen dependency, 24 hours, diabetes mellitus, hyperlipidemia, hypertension, GERD, morbid obesity with a 40+-pack-year history of tobacco use who I had last seen in January 2018 for worsening dyspnea and hypoxemia.  She also has history of left renal cell carcinoma status post cryoablation in July 2017.  She underwent transthoracic echocardiogram which revealed normal LV systolic function however there was right ventricular volume and pressure overload and severe pulmonary hypertension and contest echocardiogram did not reveal any intracardiac shunting.  Patient markedly dyspneic and hence was advised to go to the hospital for further evaluation and possible evaluation of pulmonary embolism due to RV strain and RV dilatation.  Patient's niece is present at the bedside.  Patient denies any chest pain but has had class 3-4 dyspnea over the past 6 months especially when the past one month.  She denies any chest pain, palpitations, dizziness or syncope.  She has had a negative nuclear stress test on 12/25/2016 and carotid duplex on 01/27/2017 revealed minimal left carotid disease.  Past Medical History:  Diagnosis Date  . Arthritis   . Back pain   . Bell's palsy   . Bipolar disorder ([place])   . Bronchitis   . Chronic low back pain 05/09/2015  . COPD (chronic obstructive pulmonary disease) ([place])    hyoxia during sleep- using O2 as needed  . Cough with expectoration 05/21/16   recent diagnosis of bronchitis  . Cyst of right kidney   . Diabetes mellitus without complication ([place])    type 2  . Frequency of urination   . GERD (gastroesophageal reflux disease)   . Glaucoma   . History of blood  transfusion   . History of colon polyps   . History of hiatal hernia   . History of tobacco use   . Hypercalcemia   . Hyperlipidemia   . Hypertension   . Memory disorder 09/05/2014  . On home oxygen therapy    3L/M [place] at night   . Schizo-affective psychosis ([place])   . Shortness of breath dyspnea    exertion, or with out oxygen  . Uterine fibroid      Past Surgical History:  Procedure Laterality Date  . ABDOMINAL HYSTERECTOMY    . APPENDECTOMY    . COLONOSCOPY W/ POLYPECTOMY    . IR GENERIC HISTORICAL  04/29/2016   IR RADIOLOGIST EVAL & MGMT 04/29/2016 Corrie Mckusick, DO [facility] INTER[facility] RAD  . IR GENERIC HISTORICAL  10/01/2016   IR RADIOLOGIST EVAL & MGMT 10/01/2016 [facility] INTER[facility] RAD  . IR GENERIC HISTORICAL  11/11/2016   IR RADIOLOGIST EVAL & MGMT 11/11/2016 Corrie Mckusick, DO [facility]  . IR GENERIC HISTORICAL  07/16/2016   IR RADIOLOGIST EVAL & MGMT 07/16/2016 Corrie Mckusick, DO [facility] INTER[facility] RAD  . IR RADIOLOGIST EVAL & MGMT  03/10/2017  . IR RADIOLOGIST EVAL & MGMT  05/13/2017  . LUMBAR LAMINECTOMY/DECOMPRESSION MICRODISCECTOMY N/A 08/14/2015   Procedure: LUMBAR DECOMPRESSION MICRODISCECTOMY L3-S1;  Surgeon: Melina Schools, MD;  Location: [facility];  Service: Orthopedics;  Laterality: N/A;  . RADIOFREQUENCY ABLATION Left 02/05/2017   Procedure: LEFT RENAL CRYOABLATION;  Surgeon: Corrie Mckusick, DO;  Location: [facility];  Service: Anesthesiology;  Laterality: Left;  . TONSILLECTOMY AND  ADENOIDECTOMY       Family History  Problem Relation Age of Onset  . Dementia Mother   . Alzheimer's disease Mother   . Heart disease Mother   . Hypertension Mother   . Diabetes Mother   . Diabetes Father   . Alzheimer's disease Sister   . Heart disease Brother   . Heart attack Brother   . Bladder Cancer Neg Hx   . Kidney cancer Neg Hx   . Prostate cancer Neg Hx      Social History: Social History   Social History  . Marital status: Divorced    Spouse name: N/A  . Number of children: 1  .  Years of education: college 4   Occupational History  . disabled    Social History Main Topics  . Smoking status: Former Smoker    Packs/day: 1.00    Years: 28.00    Types: Cigarettes    Quit date: 07/15/2015  . Smokeless tobacco: Never Used  . Alcohol use No  . Drug use: No  . Sexual activity: No   Other Topics Concern  . Not on file   Social History Narrative   Patient is right handed.   Patient drinks 2 cups caffeine daily.    Review of Systems - General ROS: positive for  - fatigue negative for - chills, fever, night sweats or weight gain Cardiovascular ROS: positive for - edema, orthopnea and shortness of breath Gastrointestinal ROS: no abdominal pain, change in bowel habits, or black or bloody stools Musculoskeletal ROS: positive for - joint pain diffuse. Other systems negative   Physical Exam: Blood pressure 132/73, pulse (!) 101, temperature 98.1 F (36.7 C), temperature source Oral, resp. rate (!) 22, SpO2 93 %.   General appearance: alert, cooperative, appears stated age, mild distress and morbidly obese Neck: no adenopathy, supple, symmetrical, trachea midline, thyroid not enlarged, symmetric, no tenderness/mass/nodules and soft carotid bruit present. JVD elevated. H-J Reflux present Lungs: clear to auscultation bilaterally Chest wall: no tenderness Heart: S1, S2 normal, S4 present, no rub and no murmur Abdomen: abnormal findings:  hepatomegaly, liver edge palpable 2 cm below costal margin and obese Extremities: edema 2 plus Pulses: faint carotid artery bruit present, femoral pulses difficult to feel,  popliteal pulse normal, pupils dorsalis pedis 2+, posterior tibial unable to feel due to edema. Neurologic: Grossly normal  Labs:  Lab Results  Component Value Date   WBC 7.7 06/09/2017   HGB 10.6 (L) 06/09/2017   HCT 34.3 (L) 06/09/2017   MCV 78.0 06/09/2017   PLT 393 06/09/2017    Recent Labs Lab 06/09/17 1435  NA 135  K 4.8  CL 108  CO2 17*  BUN  35*  CREATININE 1.57*  CALCIUM 9.8  PROT 6.7  BILITOT 0.7  ALKPHOS 63  ALT 48  AST 40  GLUCOSE 58*    Lipid Panel     Component Value Date/Time   CHOL 245 (H) 03/19/2016 1129   TRIG 234.0 (H) 03/19/2016 1129   HDL 61.60 03/19/2016 1129   CHOLHDL 4 03/19/2016 1129   VLDL 46.8 (H) 03/19/2016 1129    BNP (last 3 results)  Recent Labs  09/04/16 1416 06/09/17 1731  BNP 15.4 877.1*    HEMOGLOBIN A1C Lab Results  Component Value Date   HGBA1C 5.4 02/02/2017   MPG 108 02/02/2017    Cardiac Panel (last 3 results)  Recent Labs  09/05/16 1030 06/09/17 1435 06/09/17 2000  TROPONINI <0.03 0.09* 0.08*  Lab Results  Component Value Date   TROPONINI 0.08 ([place]) 06/09/2017     TSH  Recent Labs  05/04/17 1634  TSH 1.60     Radiology: Ct Angio Chest Pe W And/or Wo Contrast  Result Date: 06/09/2017 CLINICAL DATA:  66 year old female with shortness of breath. Personal history left renal cell carcinoma status post cryoablation in 2017. EXAM: CT ANGIOGRAPHY CHEST WITH CONTRAST TECHNIQUE: Multidetector CT imaging of the chest was performed using the standard protocol during bolus administration of intravenous contrast. Multiplanar CT image reconstructions and MIPs were obtained to evaluate the vascular anatomy. CONTRAST:  80 mL Isovue 370 COMPARISON:  Chest radiographs 05/04/2017. Chest CT 11/10/2016. Chest CTA 02/11/2016. Restaging CT Abdomen 05/13/2017. FINDINGS: Cardiovascular: Good contrast bolus timing in the pulmonary arterial tree. Mild respiratory motion artifact in the costophrenic angles. No focal filling defect identified in the pulmonary arteries to suggest acute pulmonary embolism. Mild cardiomegaly. No pericardial effusion. No aortic contrast today. Calcified aortic and coronary artery atherosclerosis is evident. Mediastinum/Nodes: Small hilar and maximal mediastinal lymph nodes are stable since February 2017, nonspecific. No new or increased lymphadenopathy.  Lungs/Pleura: Centrilobular emphysema. Mild septal thickening in the lung apices. Atelectatic changes to the major airways. Mosaic attenuation in the lower lobes. No consolidation. No pleural effusion. Upper Abdomen: Negative visualized liver, spleen, pancreas and bowel in the upper abdomen. Visible renal nodularity appears stable. Musculoskeletal: Upper thoracic spina bifida occulta , normal variant. L1 spina bifida occulta. No acute osseous abnormality identified. Review of the MIP images confirms the above findings. IMPRESSION: 1.  No evidence of acute pulmonary embolus. 2. Emphysema with superimposed mild septal thickening and ground-glass opacity/mosaic attenuation. Consider mild or developing interstitial edema versus acute viral/atypical respiratory infection. 3. Superimposed mild chronic mediastinal and hilar lymphadenopathy is probably postinflammatory. Thoracic sarcoidosis is a less likely possibility. 4. Cardiomegaly.  No pericardial or pleural effusion. 5. Aortic Atherosclerosis (ICD10-I70.0) and Emphysema (ICD10-J43.9). Electronically Signed   By: Genevie Ann M.D.   On: 06/09/2017 17:04    Scheduled Meds: . furosemide  40 mg Intravenous BID  . methylPREDNISolone (SOLU-MEDROL) injection  60 mg Intravenous Q8H  . [START ON 06/10/2017] pantoprazole  40 mg Oral Daily   Continuous Infusions: PRN Meds:.  CARDIAC STUDIES:  EKG 06/09/2017: Normal sinus rhythm at rate of 92 bpm, rightward axis, nonspecific T-wave flattening.  Lexiscan myoview stress test 12/25/2016: 1. The resting electrocardiogram demonstrated normal sinus rhythm, normal resting conduction, no resting arrhythmias and normal rest repolarization. Stress EKG is non-diagnostic for ischemia as it a pharmacologic stress using Lexiscan. Stress symptoms included dyspnea. 2. The perfusion imaging study demonstrates breast attenuation artifact in the inferior wall and apical thinning but no demonstrable ischemia or scar. There is normal  wall motion in all vascular territories, LVEF was calculated at 61%. This represents a low risk study.  Echocardiogram 06/09/2017: - Left ventricle: The cavity size was normal. Wall thickness was  increased in a pattern of mild LVH. Systolic function was normal.  The estimated ejection fraction was in the range of 55% to 60%.  Wall motion was normal; there were no regional wall motion   abnormalities. Left ventricular diastolic function parameters  were normal.- Ventricular septum: Septal flattening consistant with RV pressure  overload. - Right ventricle: The cavity size was moderately dilated. Wall  thickness was normal. Normal systolic function - Right atrium: The atrium was moderately dilated. - Atrial septum: No defect or patent foramen ovale was identified.   Echo contrast study showed no right-to-left atrial  level shunt,   at baseline or with provocation. - Pulmonary arteries: PA peak pressure: 89 mm Hg (S). - Impressions: Right sided morphology consistant  with cor pulmonale   and severe pulmonary HTN by TR estimate Compared to the echocardiogram done 09/06/2016, right ventricular findings are new.  Pulmonary hypertension is new.  ASSESSMENT AND PLAN:  1.  Acute on chronic diastolic heart failure 2.  Chronic cor pulmonale probably related to underlying COPD and morbid obesity. However due to recent worsening dyspnea hypoxemia, severe pulmonary hypertension,need to exclude primary pulmonary hypertension. 3. Diabetes mellitus type II controlled 4. Hypertension 5. Hyperlipidemia 6. Centrilobular emphysema 7. Stage 3 CKD due to DM, HTN and h/o left upper pole cryoablation for renal cell carcinoma.  Recommendation: Patient needs aggressive diuresis and suspect that she won't be completely better or the hospitalization but would like to stabilize her and probable discharge in 2-3 days with aggressive monitoring of her diet, weight loss and salt restriction.  Once she is stable, consider right  heart catheterization to evaluate for pulmonary hypertension and further management.  Her niece who is an Therapist, sports is present at the bedside, I have discussed extensively regarding dietary modification and salt restriction. With regard to her medication, we need to resume her antihypertensive medications and diabetes management. She will need workup for connective tissue disorder, will order ANA with reflux.  Increased Lasix to TID and added Imdur for preload. She has ground glass appearance in her CT chest and consider ILD leading to chronic corpulmonale.   Adrian Prows, MD 06/09/2017, 10:05 PM [facility] [place] Pager: [phone] Office: [phone] If no answer Cell [phone]

## 2017-06-09 NOTE — Progress Notes (Signed)
Elizabeth Thompson presented for echocardiogram referred by Dr. Melvyn Novas from [facility] for dyspnea on exertion. Compared to the echo performed in Sept of 2017, there was a large difference in right heart pressure. DOD (Dr. Meda Coffee) was notified and she called Dr. Einar Gip to let him know that we were sending Elizabeth Thompson to the emergency room for a possible PE.   Wyatt Mage, [place] 06/09/2017

## 2017-06-09 NOTE — ED Notes (Signed)
Admitting MD at bedside.

## 2017-06-09 NOTE — Care Management Note (Addendum)
Case Management Note  Patient Details  Name: Elizabeth Thompson MRN: [id number] Date of Birth: 12/22/1950  Subjective/Objective:            Patient presented to [facility] ED with c/o SOB. Patient's medical history significant of hypertension, hyperlipidemia, diabetes mellitus, COPD, on home oxygen 2-3 L, GERD, hypercalcemia, formal smoker, bipolar, depression, back pain, renal cell carcinoma (S/P left nephrectomy 06/2016,         Action/Plan: ED CM met with patient at bedside to discuss care transitional planning. Patient reports she lives at home with her niece [name] [phone]. [employer] source Everardo Beals PCP. Patient is on 3L home O2 supplied by [employer]. Home Pharmacy [facility] on [address]. Discussed [place] services with patient she states, she currently not active with [facility] services but does feel she will benefit from [facility] service. Patient is agreeable with tentative discharge plan and verbalizes understanding teach back done. CM explained that the Unit CM will follow up with her  Transitional care needs.     Expected Discharge Date:                  Expected Discharge Plan:  [place]  In-House Referral:     Discharge planning Services  CM Consult  Post Acute Care Choice:    Choice offered to:  Patient  DME Arranged:  [place] [facility] DME Agency:  [place] Arranged:  RN, PT, Disease Management, OT (CM discussed [place] services patient would like to confer before selecting [facility] services with her caregiver [name] [phone] (neice) ) [place] Agency:     Status of Service:  In process, will continue to follow  If discussed at Long Length of Stay Meetings, dates discussed:    Additional CommentsLaurena Slimmer, RN 06/09/2017, 9:15 PM

## 2017-06-09 NOTE — ED Notes (Signed)
Got patient undress cleaned up patient had loose stool and hooked to the monitor did ekg shown to Dr Gilford Raid

## 2017-06-09 NOTE — ED Notes (Signed)
Pt transported to CT via stretcher

## 2017-06-09 NOTE — ED Notes (Signed)
Attempted to call report and RN stated she would call back.

## 2017-06-09 NOTE — ED Triage Notes (Signed)
Per EMS- pt had an echo done, noted to have decreased pressures on the right. Pt reports increased shortness of breath for weeks. Sent here by MD for PE study. 18G PIV to [facility]

## 2017-06-09 NOTE — H&P (Signed)
TRH H&P   Patient Demographics:    Elizabeth Thompson, is a 66 y.o. female  MRN: [id number]   DOB - 01/17/1951  Admit Date - 06/09/2017  Outpatient Primary MD for the patient is Everardo Beals, NP  Referring MD/NP/PA: Dr Gilford Raid  Outpatient Specialists: Cards Dr Einar Gip  Patient coming from: Home  Chief Complaint  Patient presents with  . Shortness of Breath  . PE rule out      HPI:    Elizabeth Thompson  is a 66 y.o. female, with medical history significant of hypertension, hyperlipidemia, diabetes mellitus, COPD, on home oxygen 2-3 L, GERD, hypercalcemia, formal smoker, bipolar, depression, back pain, renal cell carcinoma (S/P left nephrectomy 06/2016, no radiation and chemotherapy), who was sent from echocardiogram lab for abnormal findings , patient was seen by Dr. Shyrl Numbers pulmonary recently for progressive dyspnea, she was sent for  echo , was noticed to have large difference and right heart pressure, so she was sent to rule out PE , NAD patient was noted to be hypoxic, she is at 3 L nasal cannula baseline, remains hypoxic on 5 L nasal cannula, CTA chest negative for PE, but significant for interstitial edema, and COPD , as well as she had elevated troponin at 0.09, but no ST abnormalities,, she denies any chest pain, hemoptysis, palpitations, fever or chills, no dysuria, polyuria, nausea or vomiting, post exertional dyspnea, sleeps at 5 kg baseline, reports worsening lower extremity edema, she received IV Solu-Medrol, IV Lasix, I was called to admit .    Review of systems:    In addition to the HPI above,  No Fever-chills, No Headache, No changes with Vision or hearing, No problems swallowing food or Liquids, No Chest pain, Complains of cough, productive of white phlegm, complaints with worsening dyspnea, mainly exertional dyspnea , she reports orthopnea, but chronic at baseline did  not worsen recently . No Abdominal pain, No Nausea or Vommitting, Bowel movements are regular, No Blood in stool or Urine, No dysuria, No new skin rashes or bruises, No new joints pains-aches,  No new weakness, tingling, numbness in any extremity, she reports generalized weakness No recent weight gain or loss, No polyuria, polydypsia or polyphagia, No significant Mental Stressors.  A full 10 point Review of Systems was done, except as stated above, all other Review of Systems were negative.   With Past History of the following :    Past Medical History:  Diagnosis Date  . Arthritis   . Back pain   . Bell's palsy   . Bipolar disorder ([place])   . Bronchitis   . Chronic low back pain 05/09/2015  . COPD (chronic obstructive pulmonary disease) ([place])    hyoxia during sleep- using O2 as needed  . Cough with expectoration 05/21/16   recent diagnosis of bronchitis  . Cyst of right kidney   . Diabetes mellitus without complication ([place])  type 2  . Frequency of urination   . GERD (gastroesophageal reflux disease)   . Glaucoma   . History of blood transfusion   . History of colon polyps   . History of hiatal hernia   . History of tobacco use   . Hypercalcemia   . Hyperlipidemia   . Hypertension   . Memory disorder 09/05/2014  . On home oxygen therapy    3L/M [place] at night   . Schizo-affective psychosis ([place])   . Shortness of breath dyspnea    exertion, or with out oxygen  . Uterine fibroid       Past Surgical History:  Procedure Laterality Date  . ABDOMINAL HYSTERECTOMY    . APPENDECTOMY    . COLONOSCOPY W/ POLYPECTOMY    . IR GENERIC HISTORICAL  04/29/2016   IR RADIOLOGIST EVAL & MGMT 04/29/2016 Corrie Mckusick, DO [facility] INTER[facility] RAD  . IR GENERIC HISTORICAL  10/01/2016   IR RADIOLOGIST EVAL & MGMT 10/01/2016 [facility] INTER[facility]  . IR GENERIC HISTORICAL  11/11/2016   IR RADIOLOGIST EVAL & MGMT 11/11/2016 Corrie Mckusick, DO [facility]  . IR GENERIC HISTORICAL  07/16/2016   IR  RADIOLOGIST EVAL & MGMT 07/16/2016 Corrie Mckusick, DO [facility] INTER[facility]  . IR RADIOLOGIST EVAL & MGMT  03/10/2017  . IR RADIOLOGIST EVAL & MGMT  05/13/2017  . LUMBAR LAMINECTOMY/DECOMPRESSION MICRODISCECTOMY N/A 08/14/2015   Procedure: LUMBAR DECOMPRESSION MICRODISCECTOMY L3-S1;  Surgeon: Melina Schools, MD;  Location: [facility];  Service: Orthopedics;  Laterality: N/A;  . RADIOFREQUENCY ABLATION Left 02/05/2017   Procedure: LEFT RENAL CRYOABLATION;  Surgeon: Corrie Mckusick, DO;  Location: [facility];  Service: Anesthesiology;  Laterality: Left;  . TONSILLECTOMY AND ADENOIDECTOMY        Social History:     Social History  Substance Use Topics  . Smoking status: Former Smoker    Packs/day: 1.00    Years: 28.00    Types: Cigarettes    Quit date: 07/15/2015  . Smokeless tobacco: Never Used  . Alcohol use No     Lives - At home  Mobility - with no assistance, but her ambulation is limited by significant dyspnea on exertion   Family History :     Family History  Problem Relation Age of Onset  . Dementia Mother   . Alzheimer's disease Mother   . Heart disease Mother   . Hypertension Mother   . Diabetes Mother   . Diabetes Father   . Alzheimer's disease Sister   . Heart disease Brother   . Heart attack Brother   . Bladder Cancer Neg Hx   . Kidney cancer Neg Hx   . Prostate cancer Neg Hx      Home Medications:   Prior to Admission medications   Medication Sig Start Date End Date Taking? Authorizing Provider  acetaminophen (TYLENOL) 650 MG CR tablet Take 1,300 mg by mouth every 12 (twelve) hours.   Yes [provider]  albuterol (PROVENTIL) (2.5 MG/3ML) 0.083% nebulizer solution Take 2.5 mg by nebulization every 4 (four) hours as needed for wheezing or shortness of breath. PLAN C   Yes [provider]  amLODipine (NORVASC) 10 MG tablet Take 5 mg by mouth at bedtime. HOLD IF B/P IS LESS THAN 100/60   Yes [provider]  ARIPiprazole (ABILIFY) 20 MG tablet Take 1  tablet (20 mg total) by mouth at bedtime. 08/27/14  Yes Kerrie Buffalo, NP  atorvastatin (LIPITOR) 20 MG tablet Take 20 mg by  mouth daily after supper.   Yes [provider]  bisacodyl (DULCOLAX) 10 MG suppository Place 1 suppository (10 mg total) rectally daily as needed for moderate constipation. Patient taking differently: Place 10 mg rectally daily as needed (for constipation).  09/08/16  Yes Vann, Jessica U, DO  budesonide-formoterol (SYMBICORT) 160-4.5 MCG/ACT inhaler Inhale 2 puffs into the lungs 2 (two) times daily.   Yes [provider]  Coenzyme Q10 (COQ10 PO) Take 1 capsule by mouth every other day.    Yes [provider]  Guaifenesin (MUCINEX MAXIMUM STRENGTH) 1200 MG TB12 Take 1,200 mg by mouth 2 (two) times daily.   Yes [provider]  Acetaminophen (TYLENOL ARTHRITIS PAIN PO) Take by mouth. Take per bottle instructions as needed    [provider]  donepezil (ARICEPT) 5 MG tablet Take 1 tablet (5 mg total) by mouth at bedtime. 08/27/14   Kerrie Buffalo, NP  dorzolamide-timolol (COSOPT) 22.3-6.8 MG/ML ophthalmic solution Place 1 drop into both eyes 2 (two) times daily.    [provider]  glimepiride (AMARYL) 4 MG tablet Take 1 tablet (4 mg total) by mouth daily with breakfast. 08/27/14   Kerrie Buffalo, NP  glucose blood (ACCU-CHEK AVIVA) test strip 1 each by Other route 2 (two) times daily. 08/04/16   Jearld Fenton, NP  hydrOXYzine (ATARAX/VISTARIL) 25 MG tablet Take 50 mg by mouth at bedtime.  04/17/16   [provider]  Ipratropium-Albuterol (COMBIVENT RESPIMAT) 20-100 MCG/ACT AERS respimat Inhale 1 puff into the lungs every 4 (four) hours as needed for wheezing. PLAN B    [provider]  lactulose (CHRONULAC) 10 GM/15ML solution Take 30 mLs (20 g total) by mouth 2 (two) times daily as needed for mild constipation. 09/08/16   Geradine Girt, DO  lamoTRIgine (LAMICTAL) 100 MG tablet Take 1 tablet (100 mg total) by  mouth every evening. Patient taking differently: Take 100 mg by mouth daily after supper.  08/27/14   Kerrie Buffalo, NP  latanoprost (XALATAN) 0.005 % ophthalmic solution Place 1 drop into both eyes at bedtime. 08/27/14   Kerrie Buffalo, NP  Magnesium 250 MG TABS Take 250 mg by mouth every other day.     [provider]  metFORMIN (GLUCOPHAGE) 1000 MG tablet Take 1 tablet (1,000 mg total) by mouth 2 (two) times daily with a meal. 08/27/14   Kerrie Buffalo, NP  mirtazapine (REMERON) 7.5 MG tablet Take 1 tablet (7.5 mg total) by mouth at bedtime. 08/27/14   Kerrie Buffalo, NP  OXYGEN Inhale 3-4 L into the lungs See admin instructions. 3 liters continuously and 4 liters if patient is ambulatory    [provider]  polyethylene glycol (MIRALAX / GLYCOLAX) packet Take 17 g by mouth daily. Patient taking differently: Take 17 g by mouth daily as needed (for constipation).  09/08/16   Geradine Girt, DO  Respiratory Therapy Supplies (FLUTTER) DEVI Use as directed. 05/26/17   Parrett, Fonnie Mu, NP  Selenium Sulf-Pyrithione-Urea 2.25 % SHAM Apply 1 application topically every 14 (fourteen) days. To scalp; for dermatitis 01/25/15   [provider]  senna (SENOKOT) 8.6 MG tablet Take 1 tablet by mouth daily as needed for constipation.     [provider]     Allergies:     Allergies  Allergen Reactions  . Codeine Nausea And Vomiting  . Prednisone Other (See Comments)    Has to monitor because she is a diabetic      Physical Exam:  Vitals  Blood pressure 128/83, pulse 93, temperature 98.1 F (36.7 C), temperature source Oral, resp. rate 15, SpO2 92 %.   1. General Frail elderly female lying in bed in NAD,    2. Normal affect and insight, Not Suicidal or Homicidal, Awake Alert, Oriented X 3.  3. No F.N deficits, ALL C.Nerves Intact, Strength 5/5 all 4 extremities, Sensation intact all 4 extremities, Plantars down going.  4. Ears and Eyes appear Normal,  Conjunctivae clear, PERRLA. Moist Oral Mucosa.  5. Supple Neck, hepatojugular reflux present, No cervical lymphadenopathy appriciated, No Carotid Bruits.  6. Symmetrical Chest wall movement, Good air movement bilaterally, CTAB.(Done after she received steroids and nebs)  7. RRR, No Gallops, Rubs or Murmurs, No Parasternal Heave, +1 edema bilaterally.  8. Positive Bowel Sounds, Abdomen Soft, No tenderness, No organomegaly appriciated,No rebound -guarding or rigidity.  9.  No Cyanosis, Normal Skin Turgor, No Skin Rash or Bruise.  10. Good muscle tone,  joints appear normal , no effusions, Normal ROM.     Data Review:    CBC  Recent Labs Lab 06/09/17 1435  WBC 7.7  HGB 10.6*  HCT 34.3*  PLT 393  MCV 78.0  MCH 24.1*  MCHC 30.9  RDW 15.9*  LYMPHSABS 1.5  MONOABS 0.5  EOSABS 0.1  BASOSABS 0.0   ------------------------------------------------------------------------------------------------------------------  Chemistries   Recent Labs Lab 06/09/17 1435  NA 135  K 4.8  CL 108  CO2 17*  GLUCOSE 58*  BUN 35*  CREATININE 1.57*  CALCIUM 9.8  AST 40  ALT 48  ALKPHOS 63  BILITOT 0.7   ------------------------------------------------------------------------------------------------------------------ CrCl cannot be calculated (Unknown ideal weight.). ------------------------------------------------------------------------------------------------------------------ No results for input(s): TSH, T4TOTAL, T3FREE, THYROIDAB in the last 72 hours.  Invalid input(s): FREET3  Coagulation profile No results for input(s): INR, PROTIME in the last 168 hours. ------------------------------------------------------------------------------------------------------------------- No results for input(s): DDIMER in the last 72 hours. -------------------------------------------------------------------------------------------------------------------  Cardiac Enzymes  Recent Labs Lab  06/09/17 1435  TROPONINI 0.09*   ------------------------------------------------------------------------------------------------------------------    Component Value Date/Time   BNP 15.4 09/04/2016 1416     ---------------------------------------------------------------------------------------------------------------  Urinalysis    Component Value Date/Time   COLORURINE COLORLESS (A) 06/09/2017 1419   APPEARANCEUR CLEAR 06/09/2017 1419   LABSPEC 1.005 06/09/2017 1419   PHURINE 5.0 06/09/2017 1419   GLUCOSEU NEGATIVE 06/09/2017 1419   HGBUR NEGATIVE 06/09/2017 1419   BILIRUBINUR NEGATIVE 06/09/2017 1419   KETONESUR NEGATIVE 06/09/2017 1419   PROTEINUR 100 (A) 06/09/2017 1419   UROBILINOGEN 0.2 08/17/2015 0109   NITRITE NEGATIVE 06/09/2017 1419   LEUKOCYTESUR NEGATIVE 06/09/2017 1419    ----------------------------------------------------------------------------------------------------------------   Imaging Results:    Ct Angio Chest Pe W And/or Wo Contrast  Result Date: 06/09/2017 CLINICAL DATA:  66 year old female with shortness of breath. Personal history left renal cell carcinoma status post cryoablation in 2017. EXAM: CT ANGIOGRAPHY CHEST WITH CONTRAST TECHNIQUE: Multidetector CT imaging of the chest was performed using the standard protocol during bolus administration of intravenous contrast. Multiplanar CT image reconstructions and MIPs were obtained to evaluate the vascular anatomy. CONTRAST:  80 mL Isovue 370 COMPARISON:  Chest radiographs 05/04/2017. Chest CT 11/10/2016. Chest CTA 02/11/2016. Restaging CT Abdomen 05/13/2017. FINDINGS: Cardiovascular: Good contrast bolus timing in the pulmonary arterial tree. Mild respiratory motion artifact in the costophrenic angles. No focal filling defect identified in the pulmonary arteries to suggest acute pulmonary embolism. Mild cardiomegaly. No pericardial effusion. No aortic contrast today. Calcified aortic and coronary artery  atherosclerosis is evident. Mediastinum/Nodes: Small hilar and maximal  mediastinal lymph nodes are stable since February 2017, nonspecific. No new or increased lymphadenopathy. Lungs/Pleura: Centrilobular emphysema. Mild septal thickening in the lung apices. Atelectatic changes to the major airways. Mosaic attenuation in the lower lobes. No consolidation. No pleural effusion. Upper Abdomen: Negative visualized liver, spleen, pancreas and bowel in the upper abdomen. Visible renal nodularity appears stable. Musculoskeletal: Upper thoracic spina bifida occulta , normal variant. L1 spina bifida occulta. No acute osseous abnormality identified. Review of the MIP images confirms the above findings. IMPRESSION: 1.  No evidence of acute pulmonary embolus. 2. Emphysema with superimposed mild septal thickening and ground-glass opacity/mosaic attenuation. Consider mild or developing interstitial edema versus acute viral/atypical respiratory infection. 3. Superimposed mild chronic mediastinal and hilar lymphadenopathy is probably postinflammatory. Thoracic sarcoidosis is a less likely possibility. 4. Cardiomegaly.  No pericardial or pleural effusion. 5. Aortic Atherosclerosis (ICD10-I70.0) and Emphysema (ICD10-J43.9). Electronically Signed   By: Genevie Ann M.D.   On: 06/09/2017 17:04    My personal review of EKG: Rhythm NSR, Rate  92 /min, QTc 513 , no Acute ST changes   Assessment & Plan:    Active Problems:   Diabetes mellitus type 2, controlled, without complications (HCC)   HLD (hyperlipidemia)   COPD GOLD 0    Essential hypertension   GERD (gastroesophageal reflux disease)   Depression   Dyspnea on exertion   Renal cell carcinoma, left (HCC)   Chronic renal disease, stage III   Acute on chronic respiratory failure (HCC)   Acute on chronic hypoxic respiratory failure - Patient 2-3 L at baseline, currently  on 5 L nasal cannula, and becomes hypoxic upon exertion. - CTA chest negative for PE but with  significant COPD, and interstitial edema. -  worsening respiratory failure secondary to COPD exacerbation and congestive heart failure.  COPD exacerbation - With significant wheezing per ED physician, continue with IV Solu-Medrol 60 mg every 8 hours, continue with the one up, continue with Symbicort.  Congestive heart failure - Initial report from 2-D echo done today with evidence of right heart failure, may be COPD with cor pulmonale versus pulmonary hypertension, will continue with gentle diuresis, treat her COPD, discussed with Dr. Doristine Counter, at some point will need right heart cath.  Hypertension - Continue with home medication  Diabetes mellitus - Continue to hold oral hypoglycemic agent and start on insulin sliding scale  CK D stage III - Renal function at baseline, continue to monitor as on IV diuresis  Depression - Continue with home medication  Hyperlipidemia - Continue with statin  Elevated troponin - She denies any chest pain, EKG nonacute, this is most likely demand ischemia in the setting of hypoxia  Prolonged QTC - Avoid QTC prolonging agents, check EKG in a.m.  DVT Prophylaxis Heparin -   SCDs  AM Labs Ordered, also please review Full Orders  Family Communication: Admission, patients condition and plan of care including tests being ordered have been discussed with the patient and niece who indicate understanding and agree with the plan and Code Status.  Code Status DNR, confirmed by patient, niece was at bedside  Likely DC to  Pending further work up  Condition GUARDED    Consults called: Cardiology Dr. Einar Gip   Admission status: Inpatient  Time spent in minutes : 65 minutes   Willian Donson M.D on 06/09/2017 at 6:19 PM  Between 7am to 7pm - Pager - [phone]. After 7pm go to [email or web address] - password [facility]  [facility] - Office  [phone]

## 2017-06-10 ENCOUNTER — Inpatient Hospital Stay (HOSPITAL_COMMUNITY): Payer: Medicare Other

## 2017-06-10 ENCOUNTER — Encounter (HOSPITAL_COMMUNITY): Payer: Self-pay | Admitting: *Deleted

## 2017-06-10 DIAGNOSIS — I2609 Other pulmonary embolism with acute cor pulmonale: Secondary | ICD-10-CM

## 2017-06-10 LAB — GLUCOSE, CAPILLARY
GLUCOSE-CAPILLARY: 233 mg/dL — AB (ref 65–99)
GLUCOSE-CAPILLARY: 207 mg/dL — AB (ref 65–99)
Glucose-Capillary: 132 mg/dL — ABNORMAL HIGH (ref 65–99)
Glucose-Capillary: 173 mg/dL — ABNORMAL HIGH (ref 65–99)
Glucose-Capillary: 186 mg/dL — ABNORMAL HIGH (ref 65–99)

## 2017-06-10 LAB — GASTROINTESTINAL PANEL BY PCR, STOOL (REPLACES STOOL CULTURE)
ADENOVIRUS F40/41: NOT DETECTED
Astrovirus: NOT DETECTED
CAMPYLOBACTER SPECIES: NOT DETECTED
CRYPTOSPORIDIUM: NOT DETECTED
Cyclospora cayetanensis: NOT DETECTED
ENTEROPATHOGENIC E COLI (EPEC): NOT DETECTED
Entamoeba histolytica: NOT DETECTED
Enteroaggregative E coli (EAEC): NOT DETECTED
Enterotoxigenic E coli (ETEC): NOT DETECTED
Giardia lamblia: NOT DETECTED
Norovirus GI/GII: NOT DETECTED
PLESIMONAS SHIGELLOIDES: NOT DETECTED
ROTAVIRUS A: NOT DETECTED
SHIGELLA/ENTEROINVASIVE E COLI (EIEC): NOT DETECTED
Salmonella species: NOT DETECTED
Sapovirus (I, II, IV, and V): NOT DETECTED
Shiga like toxin producing E coli (STEC): NOT DETECTED
Vibrio cholerae: NOT DETECTED
Vibrio species: NOT DETECTED
YERSINIA ENTEROCOLITICA: NOT DETECTED

## 2017-06-10 LAB — BASIC METABOLIC PANEL
Anion gap: 11 (ref 5–15)
BUN: 42 mg/dL — AB (ref 6–20)
CO2: 18 mmol/L — ABNORMAL LOW (ref 22–32)
CREATININE: 1.92 mg/dL — AB (ref 0.44–1.00)
Calcium: 9.6 mg/dL (ref 8.9–10.3)
Chloride: 104 mmol/L (ref 101–111)
GFR calc Af Amer: 30 mL/min — ABNORMAL LOW (ref >60)
GFR, EST NON AFRICAN AMERICAN: 26 mL/min — AB (ref >60)
GLUCOSE: 235 mg/dL — AB (ref 65–99)
Potassium: 5 mmol/L (ref 3.5–5.1)
SODIUM: 133 mmol/L — AB (ref 135–145)

## 2017-06-10 LAB — BRAIN NATRIURETIC PEPTIDE: B Natriuretic Peptide: 1001.6 pg/mL — ABNORMAL HIGH (ref 0.0–100.0)

## 2017-06-10 LAB — CBC
HCT: 34.4 % — ABNORMAL LOW (ref 36.0–46.0)
Hemoglobin: 11.1 g/dL — ABNORMAL LOW (ref 12.0–15.0)
MCH: 24.8 pg — ABNORMAL LOW (ref 26.0–34.0)
MCHC: 32.3 g/dL (ref 30.0–36.0)
MCV: 76.8 fL — ABNORMAL LOW (ref 78.0–100.0)
PLATELETS: 374 10*3/uL (ref 150–400)
RBC: 4.48 MIL/uL (ref 3.87–5.11)
RDW: 15.9 % — AB (ref 11.5–15.5)
WBC: 6.1 10*3/uL (ref 4.0–10.5)

## 2017-06-10 LAB — TROPONIN I: TROPONIN I: 0.04 ng/mL — AB (ref <0.03)

## 2017-06-10 MED ORDER — IPRATROPIUM-ALBUTEROL 0.5-2.5 (3) MG/3ML IN SOLN: 3.0000 mL | RESPIRATORY_TRACT | Status: DC | PRN

## 2017-06-10 MED ORDER — PREDNISONE 50 MG PO TABS
60.0000 mg | ORAL_TABLET | Freq: Every day | ORAL | Status: DC
  Administered 2017-06-10: 60 mg via ORAL
  Filled 2017-06-10: qty 1

## 2017-06-10 MED ORDER — MAGNESIUM OXIDE 400 (241.3 MG) MG PO TABS
200.0000 mg | ORAL_TABLET | ORAL | Status: DC
  Administered 2017-06-10 – 2017-06-14 (×3): 200 mg via ORAL
  Filled 2017-06-10 (×4): qty 1

## 2017-06-10 MED ORDER — FUROSEMIDE 10 MG/ML IJ SOLN
40.0000 mg | Freq: Two times a day (BID) | INTRAMUSCULAR | Status: DC
  Administered 2017-06-10 (×2): 40 mg via INTRAVENOUS
  Filled 2017-06-10 (×2): qty 4

## 2017-06-10 MED ORDER — DIGOXIN 125 MCG PO TABS
0.2500 mg | ORAL_TABLET | Freq: Every day | ORAL | Status: DC
  Administered 2017-06-10 – 2017-06-12 (×3): 0.25 mg via ORAL
  Filled 2017-06-10 (×3): qty 2

## 2017-06-10 NOTE — Progress Notes (Signed)
Patient stable during 7 a to 7 p shift, no complaints of pain.  States shortness of breath has improved since admission, remains on oxygen as per baseline.  Patient intermittently forgetful, attempting to get up on own at times. Stressed to patient the importance of calling staff if she needs to get up.  Bed alarm on.

## 2017-06-10 NOTE — Progress Notes (Signed)
GI panel noted to be negative, C diff resulted as negative yesterday.  Patient removed from enteric precautions as per protocol.

## 2017-06-10 NOTE — Progress Notes (Signed)
Patient ID: Elizabeth Thompson, female   DOB: [birth year], 66 y.o.   MRN: [id number]                                                                PROGRESS NOTE                                                                                                                                                                                                             Patient Demographics:    Elizabeth Thompson, is a 66 y.o. female, DOB - 12/26/1950, HUT:[id number]  Admit date - 06/09/2017   Admitting Physician Albertine Patricia, MD  Outpatient Primary MD for the patient is Everardo Beals, NP  LOS - 1  Outpatient Specialists:     Chief Complaint  Patient presents with  . Shortness of Breath  . PE rule out       Brief Narrative   66 y.o. female, with medical history significant of hypertension, hyperlipidemia, diabetes mellitus, COPD, on home oxygen 2-3 L, GERD, hypercalcemia, formal smoker, bipolar, depression, back pain, renal cell carcinoma (S/P left nephrectomy 06/2016, no radiation and chemotherapy), who was sent from echocardiogram lab for abnormal findings , patient was seen by Dr. Shyrl Numbers pulmonary recently for progressive dyspnea, she was sent for  echo , was noticed to have large difference and right heart pressure, so she was sent to rule out PE , NAD patient was noted to be hypoxic, she is at 3 L nasal cannula baseline, remains hypoxic on 5 L nasal cannula, CTA chest negative for PE, but significant for interstitial edema, and COPD , as well as she had elevated troponin at 0.09, but no ST abnormalities,, she denies any chest pain, hemoptysis, palpitations, fever or chills, no dysuria, polyuria, nausea or vomiting, post exertional dyspnea, sleeps at 5 kg baseline, reports worsening lower extremity edema, she received IV Solu-Medrol, IV Lasix, I was called to admit .    Subjective:    Elizabeth Thompson today is feeling slightly better , still with dyspnea.  Afebrile over nite,  No cough.  Denies  cp, palp, lower ext edema.  , No headache,  No abdominal pain - No Nausea, No new weakness tingling or numbness,    Assessment  & Plan :    Active Problems:  Diabetes mellitus type 2, controlled, without complications ([place])   HLD (hyperlipidemia)   COPD GOLD 0    Essential hypertension   GERD (gastroesophageal reflux disease)   Depression   Dyspnea on exertion   Renal cell carcinoma, left (HCC)   Chronic renal disease, stage III   Acute on chronic respiratory failure (HCC)   Acute on chronic hypoxic respiratory failure - Patient 2-3 L at baseline, currently  on 5 L nasal cannula, and becomes hypoxic upon exertion. - CTA chest negative for PE but with significant COPD, and interstitial edema. -  worsening respiratory failure secondary to COPD exacerbation and congestive heart failure.  COPD exacerbation continue with Symbicort. D/c solumedrol 6/28 Start prednisone 63m po qday  Congestive heart failure - Initial report from 2-D echo done today with evidence of right heart failure, may be COPD with cor pulmonale versus pulmonary hypertension, will continue with gentle diuresis, treat her COPD, discussed with Dr. GDoristine Counter at some point will need right heart cath. Cont lasix   Hypertension - Continue with home medication  Diabetes mellitus - Continue to hold oral hypoglycemic agent and start on insulin sliding scale  CK D stage III - Renal function at baseline, continue to monitor as on IV diuresis  Depression - Continue with home medication  Hyperlipidemia - Continue with statin  Elevated troponin - She denies any chest pain, EKG nonacute, this is most likely demand ischemia in the setting of hypoxia  Prolonged QTC - Avoid QTC prolonging agents, check EKG in a.m.  DVT Prophylaxis Heparin -   SCDs  AM Labs Ordered, also please review Full Orders  Family Communication: Admission, patients condition and plan of care including tests being ordered have been  discussed with the patient and niece who indicate understanding and agree with the plan and Code Status.  Code Status DNR, confirmed by patient, niece was at bedside  Likely DC to  Pending further work up  Condition GUARDED    Consults called: Cardiology Dr. GEinar Gip  Admission status: Inpatient     Lab Results  Component Value Date   PLT 393 06/09/2017    Antibiotics: none  Anti-infectives    None        Objective:   Vitals:   06/09/17 2130 06/09/17 2228 06/10/17 0059 06/10/17 0137  BP: 132/73 (!) 139/59 138/75   Pulse: (!) 101 99 100   Resp: (!) _0 Temp:  98.5 F (36.9 C) 98.2 F (36.8 C)   TempSrc:  Oral Oral   SpO2: 93% 100% 100% 99%  Weight:  93.5 kg (206 lb 3.2 oz)    Height:  _1  (1.651 m)      Wt Readings from Last 3 Encounters:  06/09/17 93.5 kg (206 lb 3.2 oz)  05/25/17 96.4 kg (212 lb 9.6 oz)  05/13/17 96.6 kg (213 lb)     Intake/Output Summary (Last 24 hours) at 06/10/17 0630 Last data filed at 06/10/17 0000  Gross per 24 hour  Intake              360 ml  Output              500 ml  Net             -140 ml     Physical Exam  Awake Alert, Oriented X 3, No new F.N deficits, Normal affect [place].AT,PERRAL Supple Neck,No JVD, No cervical lymphadenopathy appriciated.  Symmetrical Chest wall movement, Good air movement bilaterally,  few crackles bilateral base RRR,No Gallops,Rubs or new Murmurs, No Parasternal Heave, slight p2 +ve B.Sounds, Abd Soft, No tenderness, No organomegaly appriciated, No rebound - guarding or rigidity. No Cyanosis, Clubbing or edema, No new Rash or bruise     Data Review:    CBC  Recent Labs Lab 06/09/17 1435  WBC 7.7  HGB 10.6*  HCT 34.3*  PLT 393  MCV 78.0  MCH 24.1*  MCHC 30.9  RDW 15.9*  LYMPHSABS 1.5  MONOABS 0.5  EOSABS 0.1  BASOSABS 0.0    Chemistries   Recent Labs Lab 06/09/17 1435  NA 135  K 4.8  CL 108  CO2 17*  GLUCOSE 58*  BUN 35*  CREATININE 1.57*  CALCIUM 9.8    AST 40  ALT 48  ALKPHOS 63  BILITOT 0.7   ------------------------------------------------------------------------------------------------------------------ No results for input(s): CHOL, HDL, LDLCALC, TRIG, CHOLHDL, LDLDIRECT in the last 72 hours.  Lab Results  Component Value Date   HGBA1C 5.4 02/02/2017   ------------------------------------------------------------------------------------------------------------------ No results for input(s): TSH, T4TOTAL, T3FREE, THYROIDAB in the last 72 hours.  Invalid input(s): FREET3 ------------------------------------------------------------------------------------------------------------------ No results for input(s): VITAMINB12, FOLATE, FERRITIN, TIBC, IRON, RETICCTPCT in the last 72 hours.  Coagulation profile No results for input(s): INR, PROTIME in the last 168 hours.  No results for input(s): DDIMER in the last 72 hours.  Cardiac Enzymes  Recent Labs Lab 06/09/17 1435 06/09/17 2000  TROPONINI 0.09* 0.08*   ------------------------------------------------------------------------------------------------------------------    Component Value Date/Time   BNP 1,001.6 (H) 06/09/2017 2358    Inpatient Medications  Scheduled Meds: . amLODipine  5 mg Oral QHS  . amLODipine  5 mg Oral Daily  . ARIPiprazole  20 mg Oral QHS  . aspirin EC  81 mg Oral Daily  . atorvastatin  20 mg Oral QPC supper  . donepezil  5 mg Oral QHS  . dorzolamide-timolol  1 drop Both Eyes BID  . furosemide  40 mg Intravenous TID  . heparin  5,000 Units Subcutaneous Q8H  . hydrOXYzine  50 mg Oral QHS  . insulin aspart  0-5 Units Subcutaneous QHS  . insulin aspart  0-9 Units Subcutaneous TID WC  . ipratropium-albuterol  3 mL Nebulization Q6H  . isosorbide mononitrate  60 mg Oral Daily  . lamoTRIgine  100 mg Oral QPM  . latanoprost  1 drop Both Eyes QHS  . magnesium oxide  200 mg Oral QODAY  . methylPREDNISolone (SOLU-MEDROL) injection  60 mg  Intravenous Q8H  . mirtazapine  7.5 mg Oral QHS  . mometasone-formoterol  2 puff Inhalation BID  . pantoprazole  40 mg Oral Daily   Continuous Infusions: PRN Meds:.acetaminophen **OR** acetaminophen, albuterol, polyethylene glycol, senna  Micro Results Recent Results (from the past 240 hour(s))  C difficile quick scan w PCR reflex     Status: None   Collection Time: 06/09/17  3:50 PM  Result Value Ref Range Status   C Diff antigen NEGATIVE NEGATIVE Final   C Diff toxin NEGATIVE NEGATIVE Final   C Diff interpretation No C. difficile detected.  Final    Radiology Reports Ct Angio Chest Pe W And/or Wo Contrast  Result Date: 06/09/2017 CLINICAL DATA:  66 year old female with shortness of breath. Personal history left renal cell carcinoma status post cryoablation in 2017. EXAM: CT ANGIOGRAPHY CHEST WITH CONTRAST TECHNIQUE: Multidetector CT imaging of the chest was performed using the standard protocol during bolus administration of intravenous contrast. Multiplanar CT image reconstructions and MIPs were obtained to evaluate the vascular  anatomy. CONTRAST:  80 mL Isovue 370 COMPARISON:  Chest radiographs 05/04/2017. Chest CT 11/10/2016. Chest CTA 02/11/2016. Restaging CT Abdomen 05/13/2017. FINDINGS: Cardiovascular: Good contrast bolus timing in the pulmonary arterial tree. Mild respiratory motion artifact in the costophrenic angles. No focal filling defect identified in the pulmonary arteries to suggest acute pulmonary embolism. Mild cardiomegaly. No pericardial effusion. No aortic contrast today. Calcified aortic and coronary artery atherosclerosis is evident. Mediastinum/Nodes: Small hilar and maximal mediastinal lymph nodes are stable since February 2017, nonspecific. No new or increased lymphadenopathy. Lungs/Pleura: Centrilobular emphysema. Mild septal thickening in the lung apices. Atelectatic changes to the major airways. Mosaic attenuation in the lower lobes. No consolidation. No pleural  effusion. Upper Abdomen: Negative visualized liver, spleen, pancreas and bowel in the upper abdomen. Visible renal nodularity appears stable. Musculoskeletal: Upper thoracic spina bifida occulta , normal variant. L1 spina bifida occulta. No acute osseous abnormality identified. Review of the MIP images confirms the above findings. IMPRESSION: 1.  No evidence of acute pulmonary embolus. 2. Emphysema with superimposed mild septal thickening and ground-glass opacity/mosaic attenuation. Consider mild or developing interstitial edema versus acute viral/atypical respiratory infection. 3. Superimposed mild chronic mediastinal and hilar lymphadenopathy is probably postinflammatory. Thoracic sarcoidosis is a less likely possibility. 4. Cardiomegaly.  No pericardial or pleural effusion. 5. Aortic Atherosclerosis (ICD10-I70.0) and Emphysema (ICD10-J43.9). Electronically Signed   By: Genevie Ann M.D.   On: 06/09/2017 17:04   Ct Abdomen W Wo Contrast  Result Date: 05/13/2017 CLINICAL DATA:  Status post cryoablation left renal cell carcinoma on 02/05/2017 EXAM: CT ABDOMEN WITHOUT AND WITH CONTRAST TECHNIQUE: Multidetector CT imaging of the abdomen was performed following the standard protocol before and following the bolus administration of intravenous contrast. CONTRAST:  18m ISOVUE-300 IOPAMIDOL (ISOVUE-300) INJECTION 61% COMPARISON:  Procedure study from 02/05/2017. Diagnostic CT scan 10/01/2016 FINDINGS: Lower chest:  Emphysema. Hepatobiliary: No focal enhancing abnormality within the liver parenchyma. Tiny low-density lesion lateral segment left liver stable since 10/01/2016, likely a cyst. Tiny calcified gallstones evident. No intrahepatic or extrahepatic biliary dilation. Pancreas: No focal mass lesion. No dilatation of the main duct. No intraparenchymal cyst. No peripancreatic edema. Spleen: No splenomegaly. No focal mass lesion. Adrenals/Urinary Tract: No adrenal nodule or mass. Remote upper pole ablation defect left  kidney demonstrates interval retraction measuring 2.2 cm today compared to 2.9 cm previously. More recent ablation defect anterior aspect upper pole is at the site of the concerning lesion identified on 10/01/2016. Today, this represents an area of irregular soft tissue attenuation demonstrating hypoenhancement. Imaging features most suggestive of post ablation scarring/granulation. Multiple cysts are again noted in both kidneys some of which have simple features and the other of which demonstrate increased attenuation on precontrast imaging. No definite enhancing or suspicious mass lesion is identified in either kidney Stomach/Bowel: Tiny hiatal hernia. Stomach unremarkable. Duodenum is normally positioned as is the ligament of Treitz. Visualize small bowel loops and colonic segments of the abdomen are unremarkable. Vascular/Lymphatic: There is abdominal aortic atherosclerosis without aneurysm. There is no gastrohepatic or hepatoduodenal ligament lymphadenopathy. No intraperitoneal or retroperitoneal lymphadenopathy. Other: No intraperitoneal free fluid. Musculoskeletal: Bone windows reveal no worrisome lytic or sclerotic osseous lesions. IMPRESSION: 1. Status post left renal cryoablation for 2 separate lesions in the upper pole. Expected appearance of the ablation defect at both sites with no features to suggest local recurrence. 2. Multiple bilateral simple renal cysts with other scattered cysts having imaging features suggesting complication by proteinaceous debris or hemorrhage. No overtly suspicious renal lesion on today's exam.  3.  Abdominal Aortic Atherosclerois (ICD10-170.0) 4.  Emphysema. (JSR15-X45.9) Electronically Signed   By: Misty Stanley M.D.   On: 05/13/2017 14:39   Ir Radiologist Eval & Mgmt  Result Date: 05/28/2017 Please refer to notes tab for details about interventional procedure. (Op Note)   Time Spent in minutes  30   Jani Gravel M.D on 06/10/2017 at 6:30 AM  Between 7am to 7pm -  Pager - [phone]  After 7pm go to [email or web address] - password [facility]  [facility] -  Office  [phone]

## 2017-06-10 NOTE — Consult Note (Signed)
Ref: Everardo Beals, NP   Subjective:  Feeling better. Some diuresis. H/O 30 pack year cigarette smoking.  Objective:  Vital Signs in the last 24 hours: Temp:  [98.1 F (36.7 C)-98.5 F (36.9 C)] 98.4 F (36.9 C) (06/28 0712) Pulse Rate:  [87-101] 87 (06/28 0712) Cardiac Rhythm: Normal sinus rhythm (06/28 0700) Resp:  [13-23] 18 (06/28 0712) BP: (118-153)/(59-96) 118/70 (06/28 0712) SpO2:  [92 %-100 %] 95 % (06/28 0822) Weight:  [93.5 kg (206 lb 3.2 oz)-93.7 kg (206 lb 8 oz)] 93.7 kg (206 lb 8 oz) (06/28 8841)  Physical Exam: BP Readings from Last 1 Encounters:  06/10/17 118/70    Wt Readings from Last 1 Encounters:  06/10/17 93.7 kg (206 lb 8 oz)    Weight change:  Body mass index is 34.36 kg/m. HEENT: [place]/AT, Eyes-Brown, PERL, EOMI, Conjunctiva-Pink, Sclera-Non-icteric Neck: No JVD, No bruit, Trachea midline. Lungs:  Clearing, Bilateral. Cardiac:  Regular rhythm, normal S1 and S2, no S3. II/VI systolic murmur. Abdomen:  Soft, non-tender. BS present. Extremities:  Trace edema present. No cyanosis. No clubbing. CNS: AxOx3, Cranial nerves grossly intact, moves all 4 extremities.  Skin: Warm and dry.   Intake/Output from previous day: 06/27 0701 - 06/28 0700 In: 360 [P.O.:360] Out: 800 [Urine:500; Stool:300]    Lab Results: BMET    Component Value Date/Time   NA 133 (L) 06/10/2017 0750   NA 135 06/09/2017 1435   NA 140 05/04/2017 1634   NA 142 09/05/2014 1459   K 5.0 06/10/2017 0750   K 4.8 06/09/2017 1435   K 4.9 05/04/2017 1634   CL 104 06/10/2017 0750   CL 108 06/09/2017 1435   CL 106 05/04/2017 1634   CO2 18 (L) 06/10/2017 0750   CO2 17 (L) 06/09/2017 1435   CO2 22 05/04/2017 1634   GLUCOSE 235 (H) 06/10/2017 0750   GLUCOSE 58 (L) 06/09/2017 1435   GLUCOSE 69 (L) 05/04/2017 1634   BUN 42 (H) 06/10/2017 0750   BUN 35 (H) 06/09/2017 1435   BUN 27 (H) 05/04/2017 1634   BUN 9 09/05/2014 1459   CREATININE 1.92 (H) 06/10/2017 0750   CREATININE 1.57  (H) 06/09/2017 1435   CREATININE 1.80 (H) 05/13/2017 1215   CALCIUM 9.6 06/10/2017 0750   CALCIUM 9.8 06/09/2017 1435   CALCIUM 10.5 05/04/2017 1634   GFRNONAA 26 (L) 06/10/2017 0750   GFRNONAA 33 (L) 06/09/2017 1435   GFRNONAA 33 (L) 02/02/2017 1449   GFRAA 30 (L) 06/10/2017 0750   GFRAA 39 (L) 06/09/2017 1435   GFRAA 38 (L) 02/02/2017 1449   CBC    Component Value Date/Time   WBC 6.1 06/10/2017 0750   RBC 4.48 06/10/2017 0750   HGB 11.1 (L) 06/10/2017 0750   HCT 34.4 (L) 06/10/2017 0750   PLT 374 06/10/2017 0750   MCV 76.8 (L) 06/10/2017 0750   MCH 24.8 (L) 06/10/2017 0750   MCHC 32.3 06/10/2017 0750   RDW 15.9 (H) 06/10/2017 0750   LYMPHSABS 1.5 06/09/2017 1435   MONOABS 0.5 06/09/2017 1435   EOSABS 0.1 06/09/2017 1435   BASOSABS 0.0 06/09/2017 1435   HEPATIC Function Panel  Recent Labs  09/05/16 1030 02/02/17 1449 06/09/17 1435  PROT 7.7 8.5* 6.7   HEMOGLOBIN A1C No components found for: HGA1C,  MPG CARDIAC ENZYMES Lab Results  Component Value Date   TROPONINI 0.04 (HH) 06/10/2017   TROPONINI 0.08 (HH) 06/09/2017   TROPONINI 0.09 ([place]) 06/09/2017   BNP  Recent Labs  05/04/17 1634  PROBNP 160.0*   TSH  Recent Labs  05/04/17 1634  TSH 1.60   CHOLESTEROL No results for input(s): CHOL in the last 8760 hours.  Scheduled Meds: . amLODipine  5 mg Oral QHS  . amLODipine  5 mg Oral Daily  . ARIPiprazole  20 mg Oral QHS  . aspirin EC  81 mg Oral Daily  . atorvastatin  20 mg Oral QPC supper  . digoxin  0.25 mg Oral Daily  . donepezil  5 mg Oral QHS  . dorzolamide-timolol  1 drop Both Eyes BID  . furosemide  40 mg Intravenous BID  . heparin  5,000 Units Subcutaneous Q8H  . hydrOXYzine  50 mg Oral QHS  . insulin aspart  0-5 Units Subcutaneous QHS  . insulin aspart  0-9 Units Subcutaneous TID WC  . isosorbide mononitrate  60 mg Oral Daily  . lamoTRIgine  100 mg Oral QPM  . latanoprost  1 drop Both Eyes QHS  . magnesium oxide  200 mg Oral QODAY  .  mirtazapine  7.5 mg Oral QHS  . mometasone-formoterol  2 puff Inhalation BID  . pantoprazole  40 mg Oral Daily  . predniSONE  60 mg Oral Q breakfast   Continuous Infusions: PRN Meds:.acetaminophen **OR** acetaminophen, ipratropium-albuterol, polyethylene glycol, senna  Assessment/Plan: Acute on chronic diastolic heart failure Chronic cor pulmonale DM, II Hypertension Hyperlipidemia COPD Stage III CKD  Agree with right heart catheterization - patient agreeable to the procedure. Will use lanoxin for inotropism for right heart failure also. Continue diuresis.   LOS: 1 day    Dixie Dials  MD  06/10/2017, 9:01 AM

## 2017-06-10 NOTE — Plan of Care (Signed)
Problem: Education: Goal: Knowledge of [facility] Education information/materials will improve Outcome: Completed/Met Date Met: 06/10/17 Discussed with patient and niece pam   Problem: Tissue Perfusion: Goal: Risk factors for ineffective tissue perfusion will decrease Outcome: Progressing Patient O2 dependent at baseline   Problem: Activity: Goal: Risk for activity intolerance will decrease Outcome: Progressing Patient up to [facility] with one assist, some dyspnea still reported   Problem: Fluid Volume: Goal: Ability to maintain a balanced intake and output will improve Outcome: Progressing Continues to receive IV diuresis

## 2017-06-11 ENCOUNTER — Encounter (HOSPITAL_COMMUNITY)
Admission: EM | Disposition: A | Discharge status: Home Health | Payer: Self-pay | Source: Home / Self Care | Attending: Internal Medicine

## 2017-06-11 DIAGNOSIS — R778 Other specified abnormalities of plasma proteins: Secondary | ICD-10-CM

## 2017-06-11 DIAGNOSIS — R7989 Other specified abnormal findings of blood chemistry: Secondary | ICD-10-CM

## 2017-06-11 HISTORY — PX: RIGHT/LEFT HEART CATH AND CORONARY ANGIOGRAPHY: CATH118266

## 2017-06-11 LAB — COMPREHENSIVE METABOLIC PANEL
ALT: 35 U/L (ref 14–54)
ANION GAP: 12 (ref 5–15)
AST: 25 U/L (ref 15–41)
Albumin: 3.7 g/dL (ref 3.5–5.0)
Alkaline Phosphatase: 64 U/L (ref 38–126)
BUN: 55 mg/dL — ABNORMAL HIGH (ref 6–20)
CHLORIDE: 98 mmol/L — AB (ref 101–111)
CO2: 20 mmol/L — ABNORMAL LOW (ref 22–32)
CREATININE: 2.6 mg/dL — AB (ref 0.44–1.00)
Calcium: 9.3 mg/dL (ref 8.9–10.3)
GFR, EST AFRICAN AMERICAN: 21 mL/min — AB (ref >60)
GFR, EST NON AFRICAN AMERICAN: 18 mL/min — AB (ref >60)
Glucose, Bld: 258 mg/dL — ABNORMAL HIGH (ref 65–99)
POTASSIUM: 4.5 mmol/L (ref 3.5–5.1)
Sodium: 130 mmol/L — ABNORMAL LOW (ref 135–145)
Total Bilirubin: 0.6 mg/dL (ref 0.3–1.2)
Total Protein: 6.8 g/dL (ref 6.5–8.1)

## 2017-06-11 LAB — POCT I-STAT 3, VENOUS BLOOD GAS (G3P V)
ACID-BASE DEFICIT: 2 mmol/L (ref 0.0–2.0)
Bicarbonate: 24.3 mmol/L (ref 20.0–28.0)
O2 SAT: 55 %
TCO2: 26 mmol/L (ref 0–100)
pCO2, Ven: 46.4 mmHg (ref 44.0–60.0)
pH, Ven: 7.327 (ref 7.250–7.430)
pO2, Ven: 31 mmHg — CL (ref 32.0–45.0)

## 2017-06-11 LAB — GLUCOSE, CAPILLARY
GLUCOSE-CAPILLARY: 179 mg/dL — AB (ref 65–99)
GLUCOSE-CAPILLARY: 78 mg/dL (ref 65–99)
GLUCOSE-CAPILLARY: 124 mg/dL — AB (ref 65–99)
Glucose-Capillary: 166 mg/dL — ABNORMAL HIGH (ref 65–99)

## 2017-06-11 LAB — CBC
HCT: 32.6 % — ABNORMAL LOW (ref 36.0–46.0)
Hemoglobin: 10.2 g/dL — ABNORMAL LOW (ref 12.0–15.0)
MCH: 24.5 pg — AB (ref 26.0–34.0)
MCHC: 31.3 g/dL (ref 30.0–36.0)
MCV: 78.4 fL (ref 78.0–100.0)
PLATELETS: 457 10*3/uL — AB (ref 150–400)
RBC: 4.16 MIL/uL (ref 3.87–5.11)
RDW: 16.4 % — AB (ref 11.5–15.5)
WBC: 6.2 10*3/uL (ref 4.0–10.5)

## 2017-06-11 LAB — POCT I-STAT 3, ART BLOOD GAS (G3+)
Acid-base deficit: 3 mmol/L — ABNORMAL HIGH (ref 0.0–2.0)
Bicarbonate: 22.8 mmol/L (ref 20.0–28.0)
O2 SAT: 90 %
PH ART: 7.352 (ref 7.350–7.450)
TCO2: 24 mmol/L (ref 0–100)
pCO2 arterial: 41.1 mmHg (ref 32.0–48.0)
pO2, Arterial: 62 mmHg — ABNORMAL LOW (ref 83.0–108.0)

## 2017-06-11 LAB — BRAIN NATRIURETIC PEPTIDE: B Natriuretic Peptide: 478.5 pg/mL — ABNORMAL HIGH (ref 0.0–100.0)

## 2017-06-11 LAB — ANA W/REFLEX IF POSITIVE: ANA: NEGATIVE

## 2017-06-11 SURGERY — RIGHT/LEFT HEART CATH AND CORONARY ANGIOGRAPHY
Anesthesia: LOCAL

## 2017-06-11 MED ORDER — FUROSEMIDE 10 MG/ML IJ SOLN: 20.0000 mg | Freq: Every day | INTRAMUSCULAR | Status: DC

## 2017-06-11 MED ORDER — IOPAMIDOL (ISOVUE-370) INJECTION 76%
INTRAVENOUS | Status: AC
Start: 2017-06-11 — End: 2017-06-11
  Filled 2017-06-11: qty 100

## 2017-06-11 MED ORDER — MIDAZOLAM HCL 2 MG/2ML IJ SOLN
INTRAMUSCULAR | Status: AC
Start: 2017-06-11 — End: 2017-06-11
  Filled 2017-06-11: qty 2

## 2017-06-11 MED ORDER — SODIUM CHLORIDE 0.9% FLUSH
3.0000 mL | Freq: Two times a day (BID) | INTRAVENOUS | Status: DC
  Administered 2017-06-11: 3 mL via INTRAVENOUS

## 2017-06-11 MED ORDER — SODIUM CHLORIDE 0.9 % IV SOLN: 250.0000 mL | INTRAVENOUS | Status: DC | PRN

## 2017-06-11 MED ORDER — HEPARIN (PORCINE) IN NACL 2-0.9 UNIT/ML-% IJ SOLN
INTRAMUSCULAR | Status: AC
Start: 2017-06-11 — End: 2017-06-11
  Filled 2017-06-11: qty 500

## 2017-06-11 MED ORDER — FENTANYL CITRATE (PF) 100 MCG/2ML IJ SOLN
INTRAMUSCULAR | Status: AC
  Filled 2017-06-11: qty 2

## 2017-06-11 MED ORDER — HEPARIN (PORCINE) IN NACL 2-0.9 UNIT/ML-% IJ SOLN
INTRAMUSCULAR | Status: AC | PRN
  Administered 2017-06-11: 1000 mL

## 2017-06-11 MED ORDER — SODIUM CHLORIDE 0.9% FLUSH: 3.0000 mL | INTRAVENOUS | Status: DC | PRN

## 2017-06-11 MED ORDER — FUROSEMIDE 10 MG/ML IJ SOLN: 40.0000 mg | Freq: Every day | INTRAMUSCULAR | Status: DC

## 2017-06-11 MED ORDER — FENTANYL CITRATE (PF) 100 MCG/2ML IJ SOLN
INTRAMUSCULAR | Status: DC | PRN
  Administered 2017-06-11: 25 ug via INTRAVENOUS

## 2017-06-11 MED ORDER — SODIUM CHLORIDE 0.9 % IV SOLN: INTRAVENOUS | Status: DC

## 2017-06-11 MED ORDER — PREDNISONE 50 MG PO TABS
50.0000 mg | ORAL_TABLET | Freq: Every day | ORAL | Status: DC
  Administered 2017-06-12: 50 mg via ORAL
  Filled 2017-06-11: qty 1

## 2017-06-11 MED ORDER — SILDENAFIL CITRATE 20 MG PO TABS
20.0000 mg | ORAL_TABLET | Freq: Three times a day (TID) | ORAL | Status: DC
  Administered 2017-06-11 – 2017-06-14 (×9): 20 mg via ORAL
  Filled 2017-06-11 (×9): qty 1

## 2017-06-11 MED ORDER — ONDANSETRON HCL 4 MG/2ML IJ SOLN: 4.0000 mg | Freq: Four times a day (QID) | INTRAMUSCULAR | Status: DC | PRN

## 2017-06-11 MED ORDER — IOPAMIDOL (ISOVUE-370) INJECTION 76%
INTRAVENOUS | Status: DC | PRN
  Administered 2017-06-11: 30 mL via INTRA_ARTERIAL

## 2017-06-11 MED ORDER — LIDOCAINE HCL (PF) 1 % IJ SOLN
INTRAMUSCULAR | Status: AC
  Filled 2017-06-11: qty 30

## 2017-06-11 MED ORDER — HEPARIN (PORCINE) IN NACL 2-0.9 UNIT/ML-% IJ SOLN
INTRAMUSCULAR | Status: AC
  Filled 2017-06-11: qty 500

## 2017-06-11 MED ORDER — MIDAZOLAM HCL 2 MG/2ML IJ SOLN
INTRAMUSCULAR | Status: DC | PRN
  Administered 2017-06-11: 1 mg via INTRAVENOUS

## 2017-06-11 MED ORDER — LIDOCAINE HCL (PF) 1 % IJ SOLN
INTRAMUSCULAR | Status: DC | PRN
  Administered 2017-06-11: 20 mL

## 2017-06-11 SURGICAL SUPPLY — 11 items
CATH INFINITI 5FR MULTPACK ANG (CATHETERS) ×1 IMPLANT
CATH SWAN GANZ 7F STRAIGHT (CATHETERS) ×1 IMPLANT
KIT HEART LEFT (KITS) ×2 IMPLANT
NDL SMART REG 18GX2-3/4 (NEEDLE) IMPLANT
NEEDLE SMART REG 18GX2-3/4 (NEEDLE) ×2 IMPLANT
PACK CARDIAC CATHETERIZATION (CUSTOM PROCEDURE TRAY) ×2 IMPLANT
SHEATH PINNACLE 5F 10CM (SHEATH) ×1 IMPLANT
SHEATH PINNACLE 7F 10CM (SHEATH) ×1 IMPLANT
TRANSDUCER W/STOPCOCK (MISCELLANEOUS) ×2 IMPLANT
WIRE EMERALD 3MM-J .025X260CM (WIRE) ×1 IMPLANT
WIRE EMERALD 3MM-J .035X150CM (WIRE) ×1 IMPLANT

## 2017-06-11 NOTE — Interval H&P Note (Signed)
History and Physical Interval Note:  06/11/2017 2:21 PM  Elizabeth Thompson  has presented today for surgery, with the diagnosis of cp  The various methods of treatment have been discussed with the patient and family. After consideration of risks, benefits and other options for treatment, the patient has consented to  Procedure(s): Right/Left Heart Cath and Coronary Angiography (N/A) as a surgical intervention .  The patient's history has been reviewed, patient examined, no change in status, stable for surgery.  I have reviewed the patient's chart and labs.  Questions were answered to the patient's satisfaction.     [name]

## 2017-06-11 NOTE — Progress Notes (Signed)
Patient ID: Elizabeth Thompson, female   DOB: 13-Apr-1951, 67 y.o.   MRN: [id number]                                                                PROGRESS NOTE                                                                                                                                                                                                             Patient Demographics:    Elizabeth Thompson, is a 66 y.o. female, DOB - 1951/12/12, NAT:[id number]  Admit date - 06/09/2017   Admitting Physician Elizabeth Patricia, MD  Outpatient Primary MD for the patient is Elizabeth Beals, NP  LOS - 2  Outpatient Specialists:    Chief Complaint  Patient presents with  . Shortness of Breath  . PE rule out       Brief Narrative  66 y.o.female,with medical history significant of hypertension, hyperlipidemia, diabetes mellitus, COPD, on home oxygen 2-3 L, GERD, hypercalcemia, formal smoker, bipolar, depression, back pain, renal cell carcinoma (S/P left nephrectomy 06/2016, no radiation and chemotherapy), who was sent from echocardiogram lab for abnormal findings , patient was seen by Elizabeth Thompson pulmonary recently for progressive dyspnea, she was sent for echo , was noticed to have large difference and right heart pressure, so she was sent to rule out PE , NAD patient was noted to be hypoxic, she is at 3 L nasal cannula baseline, remains hypoxic on 5 L nasal cannula, CTA chest negative for PE, but significant for interstitial edema, and COPD , as well as she had elevated troponin at 0.09, but no ST abnormalities,, she denies any chest pain, hemoptysis, palpitations, fever or chills, no dysuria, polyuria, nausea or vomiting, post exertional dyspnea, sleeps at 5 kg baseline, reports worsening lower extremity edema, she received IV Solu-Medrol, IV Lasix, I was called to admit .   Subjective:    Elizabeth Thompson today states that breathing is better.  No headache, No chest pain, No abdominal pain - No Nausea,  No new weakness tingling or numbness, No Cough -   Assessment  & Plan :    Active Problems:   Diabetes mellitus type 2, controlled, without complications (HCC)   HLD (hyperlipidemia)   COPD GOLD 0  Essential hypertension   GERD (gastroesophageal reflux disease)   Depression   Dyspnea on exertion   Renal cell carcinoma, left (HCC)   Chronic renal disease, stage III   Acute on chronic respiratory failure (HCC)   Cor pulmonale, acute (HCC)   Acute on chronic hypoxic respiratory failure - Patient 2-3 L at baseline, currently on 5 L nasal cannula, and becomes hypoxic upon exertion. - CTA chest negative for PE but with significant COPD, and interstitial edema. - worsening respiratory failure secondary to COPD exacerbation and congestive heart failure.  COPD exacerbation continue with Symbicort. D/c solumedrol 6/28 Decrease prednisone 37m po qday   Acute Renal Failure  On CK D stage III Renal function at baseline, continue to monitor as on IV diuresis Hold Lasix today Check cmp in am If creatinine is worsening, may have to stop diuresis  Congestive heart failure - Initial report from 2-D echo done today with evidence of right heart failure, may be COPD with cor pulmonale versus pulmonary hypertension, will continue with gentle diuresis, treat her COPD, discussed with Elizabeth Thompson at some point will need right heart cath. Hold Lasix today Appreciate cardiology input  Hypertension - Continue with home medication  Diabetes mellitus - Continue to hold oral hypoglycemic agent and start on insulin sliding scale  Depression - Continue with home medication  Hyperlipidemia - Continue with statin  Elevated troponin - She denies any chest pain, EKG nonacute, this is most likely demand ischemia in the setting of hypoxia  Prolonged QTC - Avoid QTC prolonging agents, check EKG in a.m.  DVT ProphylaxisHeparin - SCDs  AM Labs Ordered, also please review Full  Orders  Family Communication:Admission, patients condition and plan of care including tests being ordered have been discussed with the patient and niecewho indicate understanding and agree with the plan and Code Status.  Code Status DNR, confirmed by patient, niece was at bedside  Likely DC to Pending further work up  Condition GUARDED   Consults called:Cardiology Elizabeth Thompson Admission status:Inpatient      Lab Results  Component Value Date   PLT 457 (H) 06/11/2017    Antibiotics  :    Anti-infectives    None        Objective:   Vitals:   06/10/17 1310 06/10/17 2008 06/11/17 0540 06/11/17 0739  BP: 120/70 109/78 113/65 132/63  Pulse: 88 93 80 82  Resp: _0 Temp: 98 F (36.7 C) 98.4 F (36.9 C) 98.1 F (36.7 C) 98.5 F (36.9 C)  TempSrc: Oral Oral Oral Oral  SpO2: 92% 92% 98% 97%  Weight:   94.2 kg (207 lb 11.2 oz)   Height:        Wt Readings from Last 3 Encounters:  06/11/17 94.2 kg (207 lb 11.2 oz)  05/25/17 96.4 kg (212 lb 9.6 oz)  05/13/17 96.6 kg (213 lb)     Intake/Output Summary (Last 24 hours) at 06/11/17 0744 Last data filed at 06/11/17 0554  Gross per 24 hour  Intake              720 ml  Output             1350 ml  Net             -630 ml     Physical Exam  Awake Alert, Oriented X 3, No new F.N deficits, Normal affect [place].AT,PERRAL Supple Neck,No JVD, No cervical lymphadenopathy appriciated.  Symmetrical Chest wall movement, Good air  movement bilaterally, CTAB RRR,No Gallops,Rubs or new Murmurs, No Parasternal Heave,  Accentuated p2 +ve B.Sounds, Abd Soft, No tenderness, No organomegaly appriciated, No rebound - guarding or rigidity. No Cyanosis, Clubbing or edema, No new Rash or bruise      Data Review:    CBC  Recent Labs Lab 06/09/17 1435 06/10/17 0750 06/11/17 0425  WBC 7.7 6.1 6.2  HGB 10.6* 11.1* 10.2*  HCT 34.3* 34.4* 32.6*  PLT 393 374 457*  MCV 78.0 76.8* 78.4  MCH 24.1* 24.8* 24.5*    MCHC 30.9 32.3 31.3  RDW 15.9* 15.9* 16.4*  LYMPHSABS 1.5  --   --   MONOABS 0.5  --   --   EOSABS 0.1  --   --   BASOSABS 0.0  --   --     Chemistries   Recent Labs Lab 06/09/17 1435 06/10/17 0750 06/11/17 0425  NA 135 133* 130*  K 4.8 5.0 4.5  CL 108 104 98*  CO2 17* 18* 20*  GLUCOSE 58* 235* 258*  BUN 35* 42* 55*  CREATININE 1.57* 1.92* 2.60*  CALCIUM 9.8 9.6 9.3  AST 40  --  25  ALT 48  --  35  ALKPHOS 63  --  64  BILITOT 0.7  --  0.6   ------------------------------------------------------------------------------------------------------------------ No results for input(s): CHOL, HDL, LDLCALC, TRIG, CHOLHDL, LDLDIRECT in the last 72 hours.  Lab Results  Component Value Date   HGBA1C 5.4 02/02/2017   ------------------------------------------------------------------------------------------------------------------ No results for input(s): TSH, T4TOTAL, T3FREE, THYROIDAB in the last 72 hours.  Invalid input(s): FREET3 ------------------------------------------------------------------------------------------------------------------ No results for input(s): VITAMINB12, FOLATE, FERRITIN, TIBC, IRON, RETICCTPCT in the last 72 hours.  Coagulation profile No results for input(s): INR, PROTIME in the last 168 hours.  No results for input(s): DDIMER in the last 72 hours.  Cardiac Enzymes  Recent Labs Lab 06/09/17 1435 06/09/17 2000 06/10/17 0750  TROPONINI 0.09* 0.08* 0.04*   ------------------------------------------------------------------------------------------------------------------    Component Value Date/Time   BNP 478.5 (H) 06/11/2017 0425    Inpatient Medications  Scheduled Meds: . amLODipine  5 mg Oral QHS  . amLODipine  5 mg Oral Daily  . ARIPiprazole  20 mg Oral QHS  . aspirin EC  81 mg Oral Daily  . atorvastatin  20 mg Oral QPC supper  . digoxin  0.25 mg Oral Daily  . donepezil  5 mg Oral QHS  . dorzolamide-timolol  1 drop Both Eyes BID   . furosemide  20 mg Intravenous Daily  . heparin  5,000 Units Subcutaneous Q8H  . hydrOXYzine  50 mg Oral QHS  . insulin aspart  0-5 Units Subcutaneous QHS  . insulin aspart  0-9 Units Subcutaneous TID WC  . isosorbide mononitrate  60 mg Oral Daily  . lamoTRIgine  100 mg Oral QPM  . latanoprost  1 drop Both Eyes QHS  . magnesium oxide  200 mg Oral QODAY  . mirtazapine  7.5 mg Oral QHS  . mometasone-formoterol  2 puff Inhalation BID  . pantoprazole  40 mg Oral Daily  . [START ON 06/12/2017] predniSONE  50 mg Oral Q breakfast   Continuous Infusions: PRN Meds:.acetaminophen **OR** acetaminophen, ipratropium-albuterol, polyethylene glycol, senna  Micro Results Recent Results (from the past 240 hour(s))  C difficile quick scan w PCR reflex     Status: None   Collection Time: 06/09/17  3:50 PM  Result Value Ref Range Status   C Diff antigen NEGATIVE NEGATIVE Final   C Diff toxin NEGATIVE NEGATIVE  Final   C Diff interpretation No C. difficile detected.  Final  Gastrointestinal Panel by PCR , Stool     Status: None   Collection Time: 06/09/17  3:50 PM  Result Value Ref Range Status   Campylobacter species NOT DETECTED NOT DETECTED Final   Plesimonas shigelloides NOT DETECTED NOT DETECTED Final   Salmonella species NOT DETECTED NOT DETECTED Final   Yersinia enterocolitica NOT DETECTED NOT DETECTED Final   Vibrio species NOT DETECTED NOT DETECTED Final   Vibrio cholerae NOT DETECTED NOT DETECTED Final   Enteroaggregative E coli (EAEC) NOT DETECTED NOT DETECTED Final   Enteropathogenic E coli (EPEC) NOT DETECTED NOT DETECTED Final   Enterotoxigenic E coli (ETEC) NOT DETECTED NOT DETECTED Final   Shiga like toxin producing E coli (STEC) NOT DETECTED NOT DETECTED Final   Shigella/Enteroinvasive E coli (EIEC) NOT DETECTED NOT DETECTED Final   Cryptosporidium NOT DETECTED NOT DETECTED Final   Cyclospora cayetanensis NOT DETECTED NOT DETECTED Final   Entamoeba histolytica NOT DETECTED NOT  DETECTED Final   Giardia lamblia NOT DETECTED NOT DETECTED Final   Adenovirus F40/41 NOT DETECTED NOT DETECTED Final   Astrovirus NOT DETECTED NOT DETECTED Final   Norovirus GI/GII NOT DETECTED NOT DETECTED Final   Rotavirus A NOT DETECTED NOT DETECTED Final   Sapovirus (I, II, IV, and V) NOT DETECTED NOT DETECTED Final    Radiology Reports Dg Chest 2 View  Result Date: 06/10/2017 CLINICAL DATA:  Shortness of breath EXAM: CHEST  2 VIEW COMPARISON:  05/04/2017 chest radiograph and prior studies. 06/09/2017 chest CT FINDINGS: Cardiomegaly and interstitial prominence again noted. There is no evidence of focal airspace disease, pulmonary edema, suspicious pulmonary nodule/mass, pleural effusion, or pneumothorax. No acute bony abnormalities are identified. IMPRESSION: Cardiomegaly without evidence of acute abnormality. Unchanged interstitial opacities. Electronically Signed   By: Margarette Canada M.D.   On: 06/10/2017 15:18   Ct Angio Chest Pe W And/or Wo Contrast  Result Date: 06/09/2017 CLINICAL DATA:  66 year old female with shortness of breath. Personal history left renal cell carcinoma status post cryoablation in 2017. EXAM: CT ANGIOGRAPHY CHEST WITH CONTRAST TECHNIQUE: Multidetector CT imaging of the chest was performed using the standard protocol during bolus administration of intravenous contrast. Multiplanar CT image reconstructions and MIPs were obtained to evaluate the vascular anatomy. CONTRAST:  80 mL Isovue 370 COMPARISON:  Chest radiographs 05/04/2017. Chest CT 11/10/2016. Chest CTA 02/11/2016. Restaging CT Abdomen 05/13/2017. FINDINGS: Cardiovascular: Good contrast bolus timing in the pulmonary arterial tree. Mild respiratory motion artifact in the costophrenic angles. No focal filling defect identified in the pulmonary arteries to suggest acute pulmonary embolism. Mild cardiomegaly. No pericardial effusion. No aortic contrast today. Calcified aortic and coronary artery atherosclerosis is  evident. Mediastinum/Nodes: Small hilar and maximal mediastinal lymph nodes are stable since February 2017, nonspecific. No new or increased lymphadenopathy. Lungs/Pleura: Centrilobular emphysema. Mild septal thickening in the lung apices. Atelectatic changes to the major airways. Mosaic attenuation in the lower lobes. No consolidation. No pleural effusion. Upper Abdomen: Negative visualized liver, spleen, pancreas and bowel in the upper abdomen. Visible renal nodularity appears stable. Musculoskeletal: Upper thoracic spina bifida occulta , normal variant. L1 spina bifida occulta. No acute osseous abnormality identified. Review of the MIP images confirms the above findings. IMPRESSION: 1.  No evidence of acute pulmonary embolus. 2. Emphysema with superimposed mild septal thickening and ground-glass opacity/mosaic attenuation. Consider mild or developing interstitial edema versus acute viral/atypical respiratory infection. 3. Superimposed mild chronic mediastinal and hilar lymphadenopathy is probably  postinflammatory. Thoracic sarcoidosis is a less likely possibility. 4. Cardiomegaly.  No pericardial or pleural effusion. 5. Aortic Atherosclerosis (ICD10-I70.0) and Emphysema (ICD10-J43.9). Electronically Signed   By: Genevie Ann M.D.   On: 06/09/2017 17:04   Ct Abdomen W Wo Contrast  Result Date: 05/13/2017 CLINICAL DATA:  Status post cryoablation left renal cell carcinoma on 02/05/2017 EXAM: CT ABDOMEN WITHOUT AND WITH CONTRAST TECHNIQUE: Multidetector CT imaging of the abdomen was performed following the standard protocol before and following the bolus administration of intravenous contrast. CONTRAST:  39m ISOVUE-300 IOPAMIDOL (ISOVUE-300) INJECTION 61% COMPARISON:  Procedure study from 02/05/2017. Diagnostic CT scan 10/01/2016 FINDINGS: Lower chest:  Emphysema. Hepatobiliary: No focal enhancing abnormality within the liver parenchyma. Tiny low-density lesion lateral segment left liver stable since 10/01/2016,  likely a cyst. Tiny calcified gallstones evident. No intrahepatic or extrahepatic biliary dilation. Pancreas: No focal mass lesion. No dilatation of the main duct. No intraparenchymal cyst. No peripancreatic edema. Spleen: No splenomegaly. No focal mass lesion. Adrenals/Urinary Tract: No adrenal nodule or mass. Remote upper pole ablation defect left kidney demonstrates interval retraction measuring 2.2 cm today compared to 2.9 cm previously. More recent ablation defect anterior aspect upper pole is at the site of the concerning lesion identified on 10/01/2016. Today, this represents an area of irregular soft tissue attenuation demonstrating hypoenhancement. Imaging features most suggestive of post ablation scarring/granulation. Multiple cysts are again noted in both kidneys some of which have simple features and the other of which demonstrate increased attenuation on precontrast imaging. No definite enhancing or suspicious mass lesion is identified in either kidney Stomach/Bowel: Tiny hiatal hernia. Stomach unremarkable. Duodenum is normally positioned as is the ligament of Treitz. Visualize small bowel loops and colonic segments of the abdomen are unremarkable. Vascular/Lymphatic: There is abdominal aortic atherosclerosis without aneurysm. There is no gastrohepatic or hepatoduodenal ligament lymphadenopathy. No intraperitoneal or retroperitoneal lymphadenopathy. Other: No intraperitoneal free fluid. Musculoskeletal: Bone windows reveal no worrisome lytic or sclerotic osseous lesions. IMPRESSION: 1. Status post left renal cryoablation for 2 separate lesions in the upper pole. Expected appearance of the ablation defect at both sites with no features to suggest local recurrence. 2. Multiple bilateral simple renal cysts with other scattered cysts having imaging features suggesting complication by proteinaceous debris or hemorrhage. No overtly suspicious renal lesion on today's exam. 3.  Abdominal Aortic Atherosclerois  (ICD10-170.0) 4.  Emphysema. (([id number]) Electronically Signed   By: EMisty StanleyM.D.   On: 05/13/2017 14:39   Ir Radiologist Eval & Mgmt  Result Date: 05/28/2017 Please refer to notes tab for details about interventional procedure. (Op Note)   Time Spent in minutes  30   JJani GravelM.D on 06/11/2017 at 7:44 AM  Between 7am to 7pm - Pager - [phone] After 7pm go to [email or web address] - password [email or web address] -  Office  [phone]

## 2017-06-11 NOTE — Progress Notes (Signed)
Pt slept well overnight, vitals stable, I/O being recorded, will continue to monitor the patient

## 2017-06-11 NOTE — H&P (View-Only) (Signed)
Ref: Everardo Beals, NP   Subjective:  Feeling better. Some diuresis. H/O 30 pack year cigarette smoking.  Objective:  Vital Signs in the last 24 hours: Temp:  [98.1 F (36.7 C)-98.5 F (36.9 C)] 98.4 F (36.9 C) (06/28 0712) Pulse Rate:  [87-101] 87 (06/28 0712) Cardiac Rhythm: Normal sinus rhythm (06/28 0700) Resp:  [13-23] 18 (06/28 0712) BP: (118-153)/(59-96) 118/70 (06/28 0712) SpO2:  [92 %-100 %] 95 % (06/28 0822) Weight:  [93.5 kg (206 lb 3.2 oz)-93.7 kg (206 lb 8 oz)] 93.7 kg (206 lb 8 oz) (06/28 2979)  Physical Exam: BP Readings from Last 1 Encounters:  06/10/17 118/70    Wt Readings from Last 1 Encounters:  06/10/17 93.7 kg (206 lb 8 oz)    Weight change:  Body mass index is 34.36 kg/m. HEENT: [place]/AT, Eyes-Brown, PERL, EOMI, Conjunctiva-Pink, Sclera-Non-icteric Neck: No JVD, No bruit, Trachea midline. Lungs:  Clearing, Bilateral. Cardiac:  Regular rhythm, normal S1 and S2, no S3. II/VI systolic murmur. Abdomen:  Soft, non-tender. BS present. Extremities:  Trace edema present. No cyanosis. No clubbing. CNS: AxOx3, Cranial nerves grossly intact, moves all 4 extremities.  Skin: Warm and dry.   Intake/Output from previous day: 06/27 0701 - 06/28 0700 In: 360 [P.O.:360] Out: 800 [Urine:500; Stool:300]    Lab Results: BMET    Component Value Date/Time   NA 133 (L) 06/10/2017 0750   NA 135 06/09/2017 1435   NA 140 05/04/2017 1634   NA 142 09/05/2014 1459   K 5.0 06/10/2017 0750   K 4.8 06/09/2017 1435   K 4.9 05/04/2017 1634   CL 104 06/10/2017 0750   CL 108 06/09/2017 1435   CL 106 05/04/2017 1634   CO2 18 (L) 06/10/2017 0750   CO2 17 (L) 06/09/2017 1435   CO2 22 05/04/2017 1634   GLUCOSE 235 (H) 06/10/2017 0750   GLUCOSE 58 (L) 06/09/2017 1435   GLUCOSE 69 (L) 05/04/2017 1634   BUN 42 (H) 06/10/2017 0750   BUN 35 (H) 06/09/2017 1435   BUN 27 (H) 05/04/2017 1634   BUN 9 09/05/2014 1459   CREATININE 1.92 (H) 06/10/2017 0750   CREATININE 1.57  (H) 06/09/2017 1435   CREATININE 1.80 (H) 05/13/2017 1215   CALCIUM 9.6 06/10/2017 0750   CALCIUM 9.8 06/09/2017 1435   CALCIUM 10.5 05/04/2017 1634   GFRNONAA 26 (L) 06/10/2017 0750   GFRNONAA 33 (L) 06/09/2017 1435   GFRNONAA 33 (L) 02/02/2017 1449   GFRAA 30 (L) 06/10/2017 0750   GFRAA 39 (L) 06/09/2017 1435   GFRAA 38 (L) 02/02/2017 1449   CBC    Component Value Date/Time   WBC 6.1 06/10/2017 0750   RBC 4.48 06/10/2017 0750   HGB 11.1 (L) 06/10/2017 0750   HCT 34.4 (L) 06/10/2017 0750   PLT 374 06/10/2017 0750   MCV 76.8 (L) 06/10/2017 0750   MCH 24.8 (L) 06/10/2017 0750   MCHC 32.3 06/10/2017 0750   RDW 15.9 (H) 06/10/2017 0750   LYMPHSABS 1.5 06/09/2017 1435   MONOABS 0.5 06/09/2017 1435   EOSABS 0.1 06/09/2017 1435   BASOSABS 0.0 06/09/2017 1435   HEPATIC Function Panel  Recent Labs  09/05/16 1030 02/02/17 1449 06/09/17 1435  PROT 7.7 8.5* 6.7   HEMOGLOBIN A1C No components found for: HGA1C,  MPG CARDIAC ENZYMES Lab Results  Component Value Date   TROPONINI 0.04 (HH) 06/10/2017   TROPONINI 0.08 (HH) 06/09/2017   TROPONINI 0.09 ([place]) 06/09/2017   BNP  Recent Labs  05/04/17 1634  PROBNP 160.0*   TSH  Recent Labs  05/04/17 1634  TSH 1.60   CHOLESTEROL No results for input(s): CHOL in the last 8760 hours.  Scheduled Meds: . amLODipine  5 mg Oral QHS  . amLODipine  5 mg Oral Daily  . ARIPiprazole  20 mg Oral QHS  . aspirin EC  81 mg Oral Daily  . atorvastatin  20 mg Oral QPC supper  . digoxin  0.25 mg Oral Daily  . donepezil  5 mg Oral QHS  . dorzolamide-timolol  1 drop Both Eyes BID  . furosemide  40 mg Intravenous BID  . heparin  5,000 Units Subcutaneous Q8H  . hydrOXYzine  50 mg Oral QHS  . insulin aspart  0-5 Units Subcutaneous QHS  . insulin aspart  0-9 Units Subcutaneous TID WC  . isosorbide mononitrate  60 mg Oral Daily  . lamoTRIgine  100 mg Oral QPM  . latanoprost  1 drop Both Eyes QHS  . magnesium oxide  200 mg Oral QODAY  .  mirtazapine  7.5 mg Oral QHS  . mometasone-formoterol  2 puff Inhalation BID  . pantoprazole  40 mg Oral Daily  . predniSONE  60 mg Oral Q breakfast   Continuous Infusions: PRN Meds:.acetaminophen **OR** acetaminophen, ipratropium-albuterol, polyethylene glycol, senna  Assessment/Plan: Acute on chronic diastolic heart failure Chronic cor pulmonale DM, II Hypertension Hyperlipidemia COPD Stage III CKD  Agree with right heart catheterization - patient agreeable to the procedure. Will use lanoxin for inotropism for right heart failure also. Continue diuresis.   LOS: 1 day    Dixie Dials  MD  06/10/2017, 9:01 AM

## 2017-06-11 NOTE — Progress Notes (Addendum)
Site area: Right groin a 5 french arterial and 7 french venous sheath was removed  Site Prior to Removal:  Level 0  Pressure Applied For 20 MINUTES    Bedrest Beginning at 1610p  Manual:   Yes.    Patient Status During Pull:  stable  Post Pull Groin Site:  Level 0  Post Pull Instructions Given:  Yes.    Post Pull Pulses Present:  Yes.    Dressing Applied:  Yes.    Comments:  VS remain stable

## 2017-06-12 ENCOUNTER — Other Ambulatory Visit: Payer: Self-pay

## 2017-06-12 LAB — GLUCOSE, CAPILLARY
GLUCOSE-CAPILLARY: 354 mg/dL — AB (ref 65–99)
GLUCOSE-CAPILLARY: 407 mg/dL — AB (ref 65–99)
GLUCOSE-CAPILLARY: 352 mg/dL — AB (ref 65–99)
GLUCOSE-CAPILLARY: 222 mg/dL — AB (ref 65–99)
Glucose-Capillary: 113 mg/dL — ABNORMAL HIGH (ref 65–99)
Glucose-Capillary: 183 mg/dL — ABNORMAL HIGH (ref 65–99)

## 2017-06-12 LAB — COMPREHENSIVE METABOLIC PANEL
ALBUMIN: 3.6 g/dL (ref 3.5–5.0)
ALT: 33 U/L (ref 14–54)
ANION GAP: 10 (ref 5–15)
AST: 23 U/L (ref 15–41)
Alkaline Phosphatase: 60 U/L (ref 38–126)
BUN: 55 mg/dL — AB (ref 6–20)
CHLORIDE: 101 mmol/L (ref 101–111)
CO2: 22 mmol/L (ref 22–32)
Calcium: 9.2 mg/dL (ref 8.9–10.3)
Creatinine, Ser: 2.7 mg/dL — ABNORMAL HIGH (ref 0.44–1.00)
GFR calc Af Amer: 20 mL/min — ABNORMAL LOW (ref >60)
GFR calc non Af Amer: 17 mL/min — ABNORMAL LOW (ref >60)
GLUCOSE: 152 mg/dL — AB (ref 65–99)
POTASSIUM: 4.2 mmol/L (ref 3.5–5.1)
SODIUM: 133 mmol/L — AB (ref 135–145)
TOTAL PROTEIN: 6.4 g/dL — AB (ref 6.5–8.1)
Total Bilirubin: 0.4 mg/dL (ref 0.3–1.2)

## 2017-06-12 LAB — CBC
HCT: 32.9 % — ABNORMAL LOW (ref 36.0–46.0)
Hemoglobin: 10.2 g/dL — ABNORMAL LOW (ref 12.0–15.0)
MCH: 24.2 pg — ABNORMAL LOW (ref 26.0–34.0)
MCHC: 31 g/dL (ref 30.0–36.0)
MCV: 78.1 fL (ref 78.0–100.0)
PLATELETS: 414 10*3/uL — AB (ref 150–400)
RBC: 4.21 MIL/uL (ref 3.87–5.11)
RDW: 16 % — AB (ref 11.5–15.5)
WBC: 8.2 10*3/uL (ref 4.0–10.5)

## 2017-06-12 LAB — BRAIN NATRIURETIC PEPTIDE: B NATRIURETIC PEPTIDE 5: 210.3 pg/mL — AB (ref 0.0–100.0)

## 2017-06-12 MED ORDER — PREDNISONE 20 MG PO TABS
40.0000 mg | ORAL_TABLET | Freq: Every day | ORAL | Status: DC
  Administered 2017-06-13 – 2017-06-14 (×2): 40 mg via ORAL
  Filled 2017-06-12 (×2): qty 2

## 2017-06-12 MED ORDER — PREDNISONE 20 MG PO TABS: 20.0000 mg | ORAL_TABLET | Freq: Every day | ORAL | Status: DC

## 2017-06-12 MED ORDER — SODIUM CHLORIDE 0.9 % IV SOLN
INTRAVENOUS | Status: AC
  Administered 2017-06-12: 10:00:00 via INTRAVENOUS

## 2017-06-12 MED ORDER — INSULIN ASPART 100 UNIT/ML ~~LOC~~ SOLN
5.0000 [IU] | Freq: Once | SUBCUTANEOUS | Status: AC
  Administered 2017-06-12: 5 [IU] via SUBCUTANEOUS

## 2017-06-12 NOTE — Progress Notes (Signed)
Pt slept well overnight, Rt femoral level 0, vitals stable, no any complain of chest pain and SOB will continue to monitor

## 2017-06-12 NOTE — Progress Notes (Signed)
SATURATION QUALIFICATIONS: (This note is used to comply with regulatory documentation for home oxygen)  Patient Saturations on Room Air at Rest =85*%  Patient Saturations on Room Air while Ambulating = 83%  Patient Saturations on 2 Liters of oxygen while Ambulating = 92%  Please briefly explain why patient needs home oxygen:. Patient O2 drops up to 83-85% room air.

## 2017-06-12 NOTE — Progress Notes (Signed)
Patient assisted to the [facility] with 1 assist. Changed Hansen and Peri-care provided.

## 2017-06-12 NOTE — Progress Notes (Signed)
Patient complaining of SOB even with O2 at 2-3 L via nasal cannula. O2 at 91-92. MD notified. Patient resting quietly at this time. Family members at bedside. Patient using BSC . Removed purewick.

## 2017-06-12 NOTE — Progress Notes (Signed)
Patient ID: Elizabeth Thompson, female   DOB: 1951/01/30, 66 y.o.   MRN: [id number]                                                                PROGRESS NOTE                                                                                                                                                                                                              Chief Complaint  Patient presents with  . Shortness of Breath  . PE rule out       Brief Narrative   66 y.o.female,  hypertension,  hyperlipidemia,  diabetes mellitus, COPD, [?ILD + AI lung disease][ on home oxygen 2-3 L,]  --? Byssinosis--used to work in [employer] GERD,  hypercalcemia,  formal smoker,  bipolar,   back pain,  renal cell carcinoma (S/P left nephrectomy 06/2016, no radiation and chemotherapy),  Admit 6/27 from echocardiogram lab for abnormal findings ,  patient was seen by Dr. Melvyn Novas pulmonary recently for progressive dyspnea, she was sent for echo , was noticed to have large difference and right heart pressure, so she was sent to rule out PE   patient was noted to be hypoxic, she is at 3 L nasal cannula baseline, remains hypoxic on 5 L nasal cannula, CTA chest negative for PE, but significant for interstitial edema, and COPD , as well as she had elevated troponin at 0.09, but no ST abnormalities  she denies any chest pain, hemoptysis, palpitations, fever or chills, no dysuria, polyuria, nausea or vomiting, post exertional dyspnea, sleeps at 5 kg baseline, reports worsening lower extremity edema, she received IV Solu-Medrol, IV Lasix, I was called to admit .   Subjective:    Feels well No new issue ssittign eating breakfast O2 sat now good--on 2 liters here at bedsdie hasnt walke yet   Assessment  & Plan :    Active Problems:   Diabetes mellitus type 2, controlled, without complications (HCC)   HLD (hyperlipidemia)   COPD GOLD 0    Essential hypertension   GERD (gastroesophageal reflux  disease)   Depression   Dyspnea on exertion   Renal cell carcinoma, left (HCC)   Chronic renal disease, stage III   Acute on chronic respiratory failure ([place])  Cor pulmonale, acute (HCC)   Elevated troponin   Acute on chronic hypoxic respiratory failure COPD exacerbation Congestive heart failure 2-D echo ?right heart failure,  CTA chest negative for PE but with significant COPD, and interstitial edema. COPD with cor pulmonale v pulmonary hypertension, group "3"  Cardiac cath done 6/29 as below gentle diuresis initally lasix iv 40 bid-->now held Rx COPD, discussed with Dr. Glenetta Hew heart cath=Severe Pulm HTN Autoimmune panel NEGATIVE Patient 2-3 L at baseline, currently on 5 L nasal cannula, and becomes hypoxic upon exertion. continue with Symbicort. D/c solumedrol 6/28 Decrease prednisone 73m po qday--started taper 6/30 Started this admission on Sildenafil for PAH group 3 Digoxin 0.25 added x 4 doses 6/29 Consider as OP Advanced HF team input on d/c--routed this note to Dr. GEinar Gipfor his consideration of the same  Acute Renal Failure  On CK D stage III recieved small amount contrast with Cath done 6/29 Baseline 27/1.8---diuresis-->55/2.7 Hold Lasix 6/29 Saline today 50 cc/h for 8hour then STOP Rpt labs am once saline run through Would diurese cautiously only--per Dr. KStevie Kerndiscussed this with him 6/30  Hypertension - holding amlodipine 5 hs for now  Diabetes mellitus - Continue to hold oral hypoglycemic agent and start on insulin sliding scale Sugars 13--124 start glipizide NOT METFORMIN ON D C HOME  Depression - Continue with home medication  Hyperlipidemia - Continue with statin  Elevated troponin - She denies any chest pain, EKG nonacute, this is most likely demand ischemia in the setting of hypoxia  Prolonged QTC - Avoid QTC prolonging agents, check EKG in a.m.  DVT ProphylaxisHeparin - SCDs  AM Labs Ordered, also please review Full  Orders  Cardiac cath 6/29  Conclusion     Ost Cx to Dist Cx lesion, 30 %stenosed.  Ost LAD to Dist LAD lesion, 25 %stenosed.  Prox RCA to Dist RCA lesion, 25 %stenosed.  LM lesion, 30 %stenosed.  Dist RCA lesion, 70 %stenosed.  Ost RPDA lesion, 50 %stenosed.  Hemodynamic findings consistent with severe pulmonary hypertension.     Family Communication:Admission, patients condition and plan of care including tests being ordered have been discussed with the patient and niecewho indicate understanding and agree with the plan and Code Status.  Code Status DNR, confirmed by patient, niece was at bedside  Likely DC to home pending walking with RN and recheck labs- as early as am 7/1  Condition GUARDED   Consults called:Cardiology Dr. GEinar Gip Admission status:Inpatient      Lab Results  Component Value Date   PLT 414 (H) 06/12/2017    Antibiotics  :    Anti-infectives    None        Objective:   Vitals:   06/11/17 2115 06/12/17 0024 06/12/17 0429 06/12/17 0522  BP: 110/70 (!) 90/56 (!) 110/53 (!) 114/58  Pulse: 82 80 78 79  Resp: _0 Temp:  98 F (36.7 C) 98.5 F (36.9 C) 97.7 F (36.5 C)  TempSrc:  Oral Oral Oral  SpO2: 98% 96% 92% 95%  Weight:    93.5 kg (206 lb 3.2 oz)  Height:        Wt Readings from Last 3 Encounters:  06/12/17 93.5 kg (206 lb 3.2 oz)  05/25/17 96.4 kg (212 lb 9.6 oz)  05/13/17 96.6 kg (213 lb)     Intake/Output Summary (Last 24 hours) at 06/12/17 0757 Last data filed at 06/12/17 0600  Gross per 24 hour  Intake  320 ml  Output             1100 ml  Net             -780 ml     Physical Exam  eomi ncat, affect flat jvd flat hsm abd soft nontedner No rebound, no organomegally Cn intact Skin soft and supple   Data Review:    CBC  Recent Labs Lab 06/09/17 1435 06/10/17 0750 06/11/17 0425 06/12/17 0251  WBC 7.7 6.1 6.2 8.2  HGB 10.6* 11.1* 10.2* 10.2*  HCT 34.3* 34.4*  32.6* 32.9*  PLT 393 374 457* 414*  MCV 78.0 76.8* 78.4 78.1  MCH 24.1* 24.8* 24.5* 24.2*  MCHC 30.9 32.3 31.3 31.0  RDW 15.9* 15.9* 16.4* 16.0*  LYMPHSABS 1.5  --   --   --   MONOABS 0.5  --   --   --   EOSABS 0.1  --   --   --   BASOSABS 0.0  --   --   --     Chemistries   Recent Labs Lab 06/09/17 1435 06/10/17 0750 06/11/17 0425 06/12/17 0251  NA 135 133* 130* 133*  K 4.8 5.0 4.5 4.2  CL 108 104 98* 101  CO2 17* 18* 20* 22  GLUCOSE 58* 235* 258* 152*  BUN 35* 42* 55* 55*  CREATININE 1.57* 1.92* 2.60* 2.70*  CALCIUM 9.8 9.6 9.3 9.2  AST 40  --  25 23  ALT 48  --  35 33  ALKPHOS 63  --  64 60  BILITOT 0.7  --  0.6 0.4   ------------------------------------------------------------------------------------------------------------------ No results for input(s): CHOL, HDL, LDLCALC, TRIG, CHOLHDL, LDLDIRECT in the last 72 hours.  Lab Results  Component Value Date   HGBA1C 5.4 02/02/2017   ------------------------------------------------------------------------------------------------------------------ No results for input(s): TSH, T4TOTAL, T3FREE, THYROIDAB in the last 72 hours.  Invalid input(s): FREET3 ------------------------------------------------------------------------------------------------------------------ No results for input(s): VITAMINB12, FOLATE, FERRITIN, TIBC, IRON, RETICCTPCT in the last 72 hours.  Coagulation profile No results for input(s): INR, PROTIME in the last 168 hours.  No results for input(s): DDIMER in the last 72 hours.  Cardiac Enzymes  Recent Labs Lab 06/09/17 1435 06/09/17 2000 06/10/17 0750  TROPONINI 0.09* 0.08* 0.04*   ------------------------------------------------------------------------------------------------------------------    Component Value Date/Time   BNP 210.3 (H) 06/12/2017 0251    Inpatient Medications  Scheduled Meds: . amLODipine  5 mg Oral QHS  . amLODipine  5 mg Oral Daily  . ARIPiprazole  20 mg  Oral QHS  . aspirin EC  81 mg Oral Daily  . atorvastatin  20 mg Oral QPC supper  . digoxin  0.25 mg Oral Daily  . donepezil  5 mg Oral QHS  . dorzolamide-timolol  1 drop Both Eyes BID  . heparin  5,000 Units Subcutaneous Q8H  . hydrOXYzine  50 mg Oral QHS  . insulin aspart  0-5 Units Subcutaneous QHS  . insulin aspart  0-9 Units Subcutaneous TID WC  . lamoTRIgine  100 mg Oral QPM  . latanoprost  1 drop Both Eyes QHS  . magnesium oxide  200 mg Oral QODAY  . mirtazapine  7.5 mg Oral QHS  . mometasone-formoterol  2 puff Inhalation BID  . pantoprazole  40 mg Oral Daily  . predniSONE  50 mg Oral Q breakfast  . sildenafil  20 mg Oral TID  . sodium chloride flush  3 mL Intravenous Q12H   Continuous Infusions: . sodium chloride     PRN Meds:.sodium  chloride, acetaminophen **OR** acetaminophen, ipratropium-albuterol, ondansetron (ZOFRAN) IV, polyethylene glycol, senna, sodium chloride flush  Micro Results Recent Results (from the past 240 hour(s))  C difficile quick scan w PCR reflex     Status: None   Collection Time: 06/09/17  3:50 PM  Result Value Ref Range Status   C Diff antigen NEGATIVE NEGATIVE Final   C Diff toxin NEGATIVE NEGATIVE Final   C Diff interpretation No C. difficile detected.  Final  Gastrointestinal Panel by PCR , Stool     Status: None   Collection Time: 06/09/17  3:50 PM  Result Value Ref Range Status   Campylobacter species NOT DETECTED NOT DETECTED Final   Plesimonas shigelloides NOT DETECTED NOT DETECTED Final   Salmonella species NOT DETECTED NOT DETECTED Final   Yersinia enterocolitica NOT DETECTED NOT DETECTED Final   Vibrio species NOT DETECTED NOT DETECTED Final   Vibrio cholerae NOT DETECTED NOT DETECTED Final   Enteroaggregative E coli (EAEC) NOT DETECTED NOT DETECTED Final   Enteropathogenic E coli (EPEC) NOT DETECTED NOT DETECTED Final   Enterotoxigenic E coli (ETEC) NOT DETECTED NOT DETECTED Final   Shiga like toxin producing E coli (STEC) NOT  DETECTED NOT DETECTED Final   Shigella/Enteroinvasive E coli (EIEC) NOT DETECTED NOT DETECTED Final   Cryptosporidium NOT DETECTED NOT DETECTED Final   Cyclospora cayetanensis NOT DETECTED NOT DETECTED Final   Entamoeba histolytica NOT DETECTED NOT DETECTED Final   Giardia lamblia NOT DETECTED NOT DETECTED Final   Adenovirus F40/41 NOT DETECTED NOT DETECTED Final   Astrovirus NOT DETECTED NOT DETECTED Final   Norovirus GI/GII NOT DETECTED NOT DETECTED Final   Rotavirus A NOT DETECTED NOT DETECTED Final   Sapovirus (I, II, IV, and V) NOT DETECTED NOT DETECTED Final    Radiology Reports Dg Chest 2 View  Result Date: 06/10/2017 CLINICAL DATA:  Shortness of breath EXAM: CHEST  2 VIEW COMPARISON:  05/04/2017 chest radiograph and prior studies. 06/09/2017 chest CT FINDINGS: Cardiomegaly and interstitial prominence again noted. There is no evidence of focal airspace disease, pulmonary edema, suspicious pulmonary nodule/mass, pleural effusion, or pneumothorax. No acute bony abnormalities are identified. IMPRESSION: Cardiomegaly without evidence of acute abnormality. Unchanged interstitial opacities. Electronically Signed   By: Margarette Canada M.D.   On: 06/10/2017 15:18   Ct Angio Chest Pe W And/or Wo Contrast  Result Date: 06/09/2017 CLINICAL DATA:  66 year old female with shortness of breath. Personal history left renal cell carcinoma status post cryoablation in 2017. EXAM: CT ANGIOGRAPHY CHEST WITH CONTRAST TECHNIQUE: Multidetector CT imaging of the chest was performed using the standard protocol during bolus administration of intravenous contrast. Multiplanar CT image reconstructions and MIPs were obtained to evaluate the vascular anatomy. CONTRAST:  80 mL Isovue 370 COMPARISON:  Chest radiographs 05/04/2017. Chest CT 11/10/2016. Chest CTA 02/11/2016. Restaging CT Abdomen 05/13/2017. FINDINGS: Cardiovascular: Good contrast bolus timing in the pulmonary arterial tree. Mild respiratory motion artifact in  the costophrenic angles. No focal filling defect identified in the pulmonary arteries to suggest acute pulmonary embolism. Mild cardiomegaly. No pericardial effusion. No aortic contrast today. Calcified aortic and coronary artery atherosclerosis is evident. Mediastinum/Nodes: Small hilar and maximal mediastinal lymph nodes are stable since February 2017, nonspecific. No new or increased lymphadenopathy. Lungs/Pleura: Centrilobular emphysema. Mild septal thickening in the lung apices. Atelectatic changes to the major airways. Mosaic attenuation in the lower lobes. No consolidation. No pleural effusion. Upper Abdomen: Negative visualized liver, spleen, pancreas and bowel in the upper abdomen. Visible renal nodularity appears stable. Musculoskeletal:  Upper thoracic spina bifida occulta , normal variant. L1 spina bifida occulta. No acute osseous abnormality identified. Review of the MIP images confirms the above findings. IMPRESSION: 1.  No evidence of acute pulmonary embolus. 2. Emphysema with superimposed mild septal thickening and ground-glass opacity/mosaic attenuation. Consider mild or developing interstitial edema versus acute viral/atypical respiratory infection. 3. Superimposed mild chronic mediastinal and hilar lymphadenopathy is probably postinflammatory. Thoracic sarcoidosis is a less likely possibility. 4. Cardiomegaly.  No pericardial or pleural effusion. 5. Aortic Atherosclerosis (ICD10-I70.0) and Emphysema (ICD10-J43.9). Electronically Signed   By: Genevie Ann M.D.   On: 06/09/2017 17:04   Ct Abdomen W Wo Contrast  Result Date: 05/13/2017 CLINICAL DATA:  Status post cryoablation left renal cell carcinoma on 02/05/2017 EXAM: CT ABDOMEN WITHOUT AND WITH CONTRAST TECHNIQUE: Multidetector CT imaging of the abdomen was performed following the standard protocol before and following the bolus administration of intravenous contrast. CONTRAST:  72m ISOVUE-300 IOPAMIDOL (ISOVUE-300) INJECTION 61% COMPARISON:   Procedure study from 02/05/2017. Diagnostic CT scan 10/01/2016 FINDINGS: Lower chest:  Emphysema. Hepatobiliary: No focal enhancing abnormality within the liver parenchyma. Tiny low-density lesion lateral segment left liver stable since 10/01/2016, likely a cyst. Tiny calcified gallstones evident. No intrahepatic or extrahepatic biliary dilation. Pancreas: No focal mass lesion. No dilatation of the main duct. No intraparenchymal cyst. No peripancreatic edema. Spleen: No splenomegaly. No focal mass lesion. Adrenals/Urinary Tract: No adrenal nodule or mass. Remote upper pole ablation defect left kidney demonstrates interval retraction measuring 2.2 cm today compared to 2.9 cm previously. More recent ablation defect anterior aspect upper pole is at the site of the concerning lesion identified on 10/01/2016. Today, this represents an area of irregular soft tissue attenuation demonstrating hypoenhancement. Imaging features most suggestive of post ablation scarring/granulation. Multiple cysts are again noted in both kidneys some of which have simple features and the other of which demonstrate increased attenuation on precontrast imaging. No definite enhancing or suspicious mass lesion is identified in either kidney Stomach/Bowel: Tiny hiatal hernia. Stomach unremarkable. Duodenum is normally positioned as is the ligament of Treitz. Visualize small bowel loops and colonic segments of the abdomen are unremarkable. Vascular/Lymphatic: There is abdominal aortic atherosclerosis without aneurysm. There is no gastrohepatic or hepatoduodenal ligament lymphadenopathy. No intraperitoneal or retroperitoneal lymphadenopathy. Other: No intraperitoneal free fluid. Musculoskeletal: Bone windows reveal no worrisome lytic or sclerotic osseous lesions. IMPRESSION: 1. Status post left renal cryoablation for 2 separate lesions in the upper pole. Expected appearance of the ablation defect at both sites with no features to suggest local  recurrence. 2. Multiple bilateral simple renal cysts with other scattered cysts having imaging features suggesting complication by proteinaceous debris or hemorrhage. No overtly suspicious renal lesion on today's exam. 3.  Abdominal Aortic Atherosclerois (ICD10-170.0) 4.  Emphysema. (([id number]) Electronically Signed   By: EMisty StanleyM.D.   On: 05/13/2017 14:39   Ir Radiologist Eval & Mgmt  Result Date: 05/28/2017 Please refer to notes tab for details about interventional procedure. (Op Note)   Time Spent in minutes  30   JVerneita Griffes MD [facility] ([phone]  [facility] -  Office  [phone]

## 2017-06-12 NOTE — Progress Notes (Signed)
Patient CBG 407. MD notified.

## 2017-06-12 NOTE — Progress Notes (Signed)
Ref: Everardo Beals, NP   Subjective:  Breathing better. Renal function edging up to 2.7 from 2.6. Vital signs stable.  Objective:  Vital Signs in the last 24 hours: Temp:  [97.7 F (36.5 C)-98.5 F (36.9 C)] 97.7 F (36.5 C) (06/30 0522) Pulse Rate:  [0-103] 79 (06/30 0522) Cardiac Rhythm: Normal sinus rhythm (06/30 0701) Resp:  [10-52] 18 (06/30 0522) BP: (90-157)/(53-96) 114/58 (06/30 0522) SpO2:  [0 %-100 %] 95 % (06/30 0522) Weight:  [93.5 kg (206 lb 3.2 oz)-94.2 kg (207 lb 10.8 oz)] 93.5 kg (206 lb 3.2 oz) (06/30 0522)  Physical Exam: BP Readings from Last 1 Encounters:  06/12/17 (!) 114/58    Wt Readings from Last 1 Encounters:  06/12/17 93.5 kg (206 lb 3.2 oz)    Weight change: 0.532 kg (1 lb 2.8 oz) Body mass index is 34.31 kg/m. HEENT: [place]/AT, Eyes-Brown, PERL, EOMI, Conjunctiva-Pink, Sclera-Non-icteric Neck: No JVD, No bruit, Trachea midline. Lungs:  Clearing, Bilateral. Cardiac:  Regular rhythm, normal S1 and S2, no S3. II/VI systolic murmur. Abdomen:  Soft, non-tender. BS present. Extremities:  No edema present. No cyanosis. No clubbing. CNS: AxOx3, Cranial nerves grossly intact, moves all 4 extremities.  Skin: Warm and dry.   Intake/Output from previous day: 06/29 0701 - 06/30 0700 In: 320 [P.O.:320] Out: 1100 [Urine:1100]    Lab Results: BMET    Component Value Date/Time   NA 133 (L) 06/12/2017 0251   NA 130 (L) 06/11/2017 0425   NA 133 (L) 06/10/2017 0750   NA 142 09/05/2014 1459   K 4.2 06/12/2017 0251   K 4.5 06/11/2017 0425   K 5.0 06/10/2017 0750   CL 101 06/12/2017 0251   CL 98 (L) 06/11/2017 0425   CL 104 06/10/2017 0750   CO2 22 06/12/2017 0251   CO2 20 (L) 06/11/2017 0425   CO2 18 (L) 06/10/2017 0750   GLUCOSE 152 (H) 06/12/2017 0251   GLUCOSE 258 (H) 06/11/2017 0425   GLUCOSE 235 (H) 06/10/2017 0750   BUN 55 (H) 06/12/2017 0251   BUN 55 (H) 06/11/2017 0425   BUN 42 (H) 06/10/2017 0750   BUN 9 09/05/2014 1459   CREATININE  2.70 (H) 06/12/2017 0251   CREATININE 2.60 (H) 06/11/2017 0425   CREATININE 1.92 (H) 06/10/2017 0750   CALCIUM 9.2 06/12/2017 0251   CALCIUM 9.3 06/11/2017 0425   CALCIUM 9.6 06/10/2017 0750   GFRNONAA 17 (L) 06/12/2017 0251   GFRNONAA 18 (L) 06/11/2017 0425   GFRNONAA 26 (L) 06/10/2017 0750   GFRAA 20 (L) 06/12/2017 0251   GFRAA 21 (L) 06/11/2017 0425   GFRAA 30 (L) 06/10/2017 0750   CBC    Component Value Date/Time   WBC 8.2 06/12/2017 0251   RBC 4.21 06/12/2017 0251   HGB 10.2 (L) 06/12/2017 0251   HCT 32.9 (L) 06/12/2017 0251   PLT 414 (H) 06/12/2017 0251   MCV 78.1 06/12/2017 0251   MCH 24.2 (L) 06/12/2017 0251   MCHC 31.0 06/12/2017 0251   RDW 16.0 (H) 06/12/2017 0251   LYMPHSABS 1.5 06/09/2017 1435   MONOABS 0.5 06/09/2017 1435   EOSABS 0.1 06/09/2017 1435   BASOSABS 0.0 06/09/2017 1435   HEPATIC Function Panel  Recent Labs  06/09/17 1435 06/11/17 0425 06/12/17 0251  PROT 6.7 6.8 6.4*   HEMOGLOBIN A1C No components found for: HGA1C,  MPG CARDIAC ENZYMES Lab Results  Component Value Date   TROPONINI 0.04 (HH) 06/10/2017   TROPONINI 0.08 (HH) 06/09/2017   TROPONINI 0.09 ([place])  06/09/2017   BNP  Recent Labs  05/04/17 1634  PROBNP 160.0*   TSH  Recent Labs  05/04/17 1634  TSH 1.60   CHOLESTEROL No results for input(s): CHOL in the last 8760 hours.  Scheduled Meds: . amLODipine  5 mg Oral QHS  . amLODipine  5 mg Oral Daily  . ARIPiprazole  20 mg Oral QHS  . aspirin EC  81 mg Oral Daily  . atorvastatin  20 mg Oral QPC supper  . digoxin  0.25 mg Oral Daily  . donepezil  5 mg Oral QHS  . dorzolamide-timolol  1 drop Both Eyes BID  . heparin  5,000 Units Subcutaneous Q8H  . hydrOXYzine  50 mg Oral QHS  . insulin aspart  0-5 Units Subcutaneous QHS  . insulin aspart  0-9 Units Subcutaneous TID WC  . lamoTRIgine  100 mg Oral QPM  . latanoprost  1 drop Both Eyes QHS  . magnesium oxide  200 mg Oral QODAY  . mirtazapine  7.5 mg Oral QHS  .  mometasone-formoterol  2 puff Inhalation BID  . pantoprazole  40 mg Oral Daily  . [START ON 06/16/2017] predniSONE  20 mg Oral QAC breakfast  . [START ON 06/13/2017] predniSONE  40 mg Oral Q breakfast  . sildenafil  20 mg Oral TID   Continuous Infusions: . sodium chloride     PRN Meds:.acetaminophen **OR** acetaminophen, ipratropium-albuterol, ondansetron (ZOFRAN) IV, polyethylene glycol, senna  Assessment/Plan: Acute on chronic diastolic heart failure Chronic cor pulmonale DM, II Hypertension Hyperlipidemia COPD CKD, III  Agree with gentle hydration. Increase activity.   LOS: 3 days    Dixie Dials  MD  06/12/2017, 9:51 AM

## 2017-06-12 NOTE — Progress Notes (Signed)
Rechecked CBG 352. MD notified.

## 2017-06-12 NOTE — Progress Notes (Signed)
Rechecked CBG 354 before giving the sliding scales.  MD notified. 9 units of Novolog insulin given. Patient asymptomatic. Will continue to monitor.

## 2017-06-13 ENCOUNTER — Other Ambulatory Visit: Payer: Self-pay

## 2017-06-13 DIAGNOSIS — I272 Pulmonary hypertension, unspecified: Secondary | ICD-10-CM

## 2017-06-13 DIAGNOSIS — E785 Hyperlipidemia, unspecified: Secondary | ICD-10-CM

## 2017-06-13 DIAGNOSIS — I10 Essential (primary) hypertension: Secondary | ICD-10-CM

## 2017-06-13 LAB — GLUCOSE, CAPILLARY
GLUCOSE-CAPILLARY: 228 mg/dL — AB (ref 65–99)
GLUCOSE-CAPILLARY: 278 mg/dL — AB (ref 65–99)
GLUCOSE-CAPILLARY: 213 mg/dL — AB (ref 65–99)
Glucose-Capillary: 114 mg/dL — ABNORMAL HIGH (ref 65–99)
Glucose-Capillary: 118 mg/dL — ABNORMAL HIGH (ref 65–99)

## 2017-06-13 LAB — COMPREHENSIVE METABOLIC PANEL
ALK PHOS: 56 U/L (ref 38–126)
ALT: 28 U/L (ref 14–54)
AST: 28 U/L (ref 15–41)
Albumin: 3.4 g/dL — ABNORMAL LOW (ref 3.5–5.0)
Anion gap: 10 (ref 5–15)
BUN: 51 mg/dL — ABNORMAL HIGH (ref 6–20)
CALCIUM: 9.4 mg/dL (ref 8.9–10.3)
CO2: 21 mmol/L — AB (ref 22–32)
CREATININE: 2.07 mg/dL — AB (ref 0.44–1.00)
Chloride: 105 mmol/L (ref 101–111)
GFR calc Af Amer: 28 mL/min — ABNORMAL LOW (ref >60)
GFR calc non Af Amer: 24 mL/min — ABNORMAL LOW (ref >60)
GLUCOSE: 87 mg/dL (ref 65–99)
Potassium: 5.7 mmol/L — ABNORMAL HIGH (ref 3.5–5.1)
SODIUM: 136 mmol/L (ref 135–145)
Total Bilirubin: 1.2 mg/dL (ref 0.3–1.2)
Total Protein: 6.4 g/dL — ABNORMAL LOW (ref 6.5–8.1)

## 2017-06-13 LAB — CBC WITH DIFFERENTIAL/PLATELET
BASOS PCT: 0 %
Basophils Absolute: 0 10*3/uL (ref 0.0–0.1)
EOS ABS: 0 10*3/uL (ref 0.0–0.7)
Eosinophils Relative: 0 %
HEMATOCRIT: 34.3 % — AB (ref 36.0–46.0)
Hemoglobin: 10.5 g/dL — ABNORMAL LOW (ref 12.0–15.0)
LYMPHS ABS: 1.6 10*3/uL (ref 0.7–4.0)
Lymphocytes Relative: 22 %
MCH: 24.2 pg — AB (ref 26.0–34.0)
MCHC: 30.6 g/dL (ref 30.0–36.0)
MCV: 79 fL (ref 78.0–100.0)
MONO ABS: 0.8 10*3/uL (ref 0.1–1.0)
MONOS PCT: 10 %
NEUTROS ABS: 5.1 10*3/uL (ref 1.7–7.7)
Neutrophils Relative %: 68 %
Platelets: 361 10*3/uL (ref 150–400)
RBC: 4.34 MIL/uL (ref 3.87–5.11)
RDW: 15.7 % — AB (ref 11.5–15.5)
WBC: 7.4 10*3/uL (ref 4.0–10.5)

## 2017-06-13 LAB — POTASSIUM: Potassium: 5 mmol/L (ref 3.5–5.1)

## 2017-06-13 LAB — LIPID PANEL
CHOL/HDL RATIO: 2.7 ratio
CHOLESTEROL: 166 mg/dL (ref 0–200)
HDL: 61 mg/dL (ref >40)
LDL Cholesterol: 91 mg/dL (ref 0–99)
Triglycerides: 72 mg/dL (ref <150)
VLDL: 14 mg/dL (ref 0–40)

## 2017-06-13 LAB — URIC ACID: URIC ACID, SERUM: 9.2 mg/dL — AB (ref 2.3–6.6)

## 2017-06-13 MED ORDER — SODIUM CHLORIDE 0.9 % IV SOLN
1.0000 g | Freq: Once | INTRAVENOUS | Status: AC
  Administered 2017-06-13: 1 g via INTRAVENOUS
  Filled 2017-06-13: qty 10

## 2017-06-13 MED ORDER — SODIUM POLYSTYRENE SULFONATE 15 GM/60ML PO SUSP
15.0000 g | Freq: Once | ORAL | Status: AC
  Administered 2017-06-13: 15 g via ORAL
  Filled 2017-06-13: qty 60

## 2017-06-13 MED ORDER — SODIUM CHLORIDE 0.9 % IV SOLN
INTRAVENOUS | Status: AC
  Administered 2017-06-13: 08:00:00 via INTRAVENOUS

## 2017-06-13 NOTE — Progress Notes (Signed)
PROGRESS NOTE    CURLEY FAYETTE  IEP:[id number] DOB: [birth year] DOA: 06/09/2017 PCP: Everardo Beals, NP   Brief Narrative: 66 year old lady with prior h/o hypertension, DM, copd on 2 to 3 lit of [place] oxygen at baseline, GERD, hypercalcemia, bipolar disorder, depression, renal cell carcinoma, s/p nephrectomy on the left , was sent to ED by Dr Melvyn Novas for evaluation of PE. Her CTA was negative for PE, but significant for interstitial edema. She was admitted for evaluation of acute on chronic respiratory failure, possibly secondary to acute diastolic heart failure, underwent cardiac catheterization on 6/29  showing severe pulmonary hypertension. She was started on sildenafil and is currently on prednisone taper. During her hospitalization, pt developed acute on CKD stage 3, currently getting gentle hydration to improve her renal function.   Assessment & Plan:   Active Problems:   Diabetes mellitus type 2, controlled, without complications (HCC)   HLD (hyperlipidemia)   COPD GOLD 0    Essential hypertension   GERD (gastroesophageal reflux disease)   Depression   Dyspnea on exertion   Renal cell carcinoma, left (HCC)   Chronic renal disease, stage III   Acute on chronic respiratory failure (HCC)   Cor pulmonale, acute (HCC)   Elevated troponin   Acute on chronic respiratory failure secondary to severe pulmonary hypertension:  Underwent cardiac cath on 6/29, showing the same.  Started on revatio,  [place] oxygen to keep sats greater than 90%.  Further management as per cardiology.    COPD exacerbation:  On prednisone taper.  Resume dulera and duo nebs as needed.    Acute on stage 3 CKD:  Probably from diuresis and contrast given during cardiac cath.  Hydrate and repeat renal parameters in am.    Hypertension:  Controlled.    Diabetes mellitus:  CBG (last 3)   Recent Labs  06/12/17 2206 06/13/17 0604 06/13/17 0724  GLUCAP 222* 114* 118*   Holding oral meds secondary to  renal issues.  Resume SSI.   Depression : resume home meds.   Hyperkalemia:  Order kayexalate. Calcium gluconate.  Get EKG. Repeat K in 4 hours.     DVT prophylaxis:heparin sq Code Status: full code. Family Communication: family  at bedside. Disposition Plan: pending improvement in renal parameters.    Consultants:   Cardiology Dr Doylene Canard.   Procedures: cardiac catheterization on 6/29  Antimicrobials: none.   Subjective: No chest pain or sob.  Breathing the same.  Cough is better.   Objective: Vitals:   06/12/17 2031 06/13/17 0600 06/13/17 0814 06/13/17 0825  BP:  137/74    Pulse:  76    Resp:  14    Temp:  98.5 F (36.9 C)    TempSrc:  Oral    SpO2: 96% 98% 92%   Weight:    93.3 kg (205 lb 11.2 oz)  Height:        Intake/Output Summary (Last 24 hours) at 06/13/17 0921 Last data filed at 06/13/17 0600  Gross per 24 hour  Intake             1595 ml  Output             2300 ml  Net             -705 ml   Filed Weights   06/11/17 1000 06/12/17 0522 06/13/17 0825  Weight: 94.2 kg (207 lb 10.8 oz) 93.5 kg (206 lb 3.2 oz) 93.3 kg (205 lb 11.2 oz)    Examination:  General exam: Appears calm and comfortable on 3 lit of [place] oxygen.  Respiratory system: diminished at bases.  Cardiovascular system: S1 & S2 heard, RRR. No JVD,. No pedal edema. 2/6 murmer presents.  Gastrointestinal system: Abdomen is nondistended, soft and nontender. No organomegaly or masses felt. Normal bowel sounds heard. Central nervous system: Alert and oriented. No focal neurological deficits. Extremities: Symmetric 5 x 5 power. Skin: No rashes, lesions or ulcers Psychiatry: Judgement and insight appear normal. Mood & affect appropriate.     Data Reviewed: I have personally reviewed following labs and imaging studies  CBC:  Recent Labs Lab 06/09/17 1435 06/10/17 0750 06/11/17 0425 06/12/17 0251 06/13/17 0354  WBC 7.7 6.1 6.2 8.2 7.4  NEUTROABS 5.7  --   --   --  5.1  HGB 10.6*  11.1* 10.2* 10.2* 10.5*  HCT 34.3* 34.4* 32.6* 32.9* 34.3*  MCV 78.0 76.8* 78.4 78.1 79.0  PLT 393 374 457* 414* 300   Basic Metabolic Panel:  Recent Labs Lab 06/09/17 1435 06/10/17 0750 06/11/17 0425 06/12/17 0251 06/13/17 0354  NA 135 133* 130* 133* 136  K 4.8 5.0 4.5 4.2 5.7*  CL 108 104 98* 101 105  CO2 17* 18* 20* 22 21*  GLUCOSE 58* 235* 258* 152* 87  BUN 35* 42* 55* 55* 51*  CREATININE 1.57* 1.92* 2.60* 2.70* 2.07*  CALCIUM 9.8 9.6 9.3 9.2 9.4   GFR: Estimated Creatinine Clearance: 30.2 mL/min (A) (by C-G formula based on SCr of 2.07 mg/dL (H)). Liver Function Tests:  Recent Labs Lab 06/09/17 1435 06/11/17 0425 06/12/17 0251 06/13/17 0354  AST 40 _0 ALT 48 35 33 28  ALKPHOS 63 64 60 56  BILITOT 0.7 0.6 0.4 1.2  PROT 6.7 6.8 6.4* 6.4*  ALBUMIN 3.9 3.7 3.6 3.4*   No results for input(s): LIPASE, AMYLASE in the last 168 hours. No results for input(s): AMMONIA in the last 168 hours. Coagulation Profile: No results for input(s): INR, PROTIME in the last 168 hours. Cardiac Enzymes:  Recent Labs Lab 06/09/17 1435 06/09/17 2000 06/10/17 0750  TROPONINI 0.09* 0.08* 0.04*   BNP (last 3 results)  Recent Labs  05/04/17 1634  PROBNP 160.0*   HbA1C: No results for input(s): HGBA1C in the last 72 hours. CBG:  Recent Labs Lab 06/12/17 1729 06/12/17 1903 06/12/17 2206 06/13/17 0604 06/13/17 0724  GLUCAP 354* 352* 222* 114* 118*   Lipid Profile:  Recent Labs  06/13/17 0728  CHOL 166  HDL 61  LDLCALC 91  TRIG 72  CHOLHDL 2.7   Thyroid Function Tests: No results for input(s): TSH, T4TOTAL, FREET4, T3FREE, THYROIDAB in the last 72 hours. Anemia Panel: No results for input(s): VITAMINB12, FOLATE, FERRITIN, TIBC, IRON, RETICCTPCT in the last 72 hours. Sepsis Labs: No results for input(s): PROCALCITON, LATICACIDVEN in the last 168 hours.  Recent Results (from the past 240 hour(s))  C difficile quick scan w PCR reflex     Status: None     Collection Time: 06/09/17  3:50 PM  Result Value Ref Range Status   C Diff antigen NEGATIVE NEGATIVE Final   C Diff toxin NEGATIVE NEGATIVE Final   C Diff interpretation No C. difficile detected.  Final  Gastrointestinal Panel by PCR , Stool     Status: None   Collection Time: 06/09/17  3:50 PM  Result Value Ref Range Status   Campylobacter species NOT DETECTED NOT DETECTED Final   Plesimonas shigelloides NOT DETECTED NOT DETECTED Final   Salmonella species  NOT DETECTED NOT DETECTED Final   Yersinia enterocolitica NOT DETECTED NOT DETECTED Final   Vibrio species NOT DETECTED NOT DETECTED Final   Vibrio cholerae NOT DETECTED NOT DETECTED Final   Enteroaggregative E coli (EAEC) NOT DETECTED NOT DETECTED Final   Enteropathogenic E coli (EPEC) NOT DETECTED NOT DETECTED Final   Enterotoxigenic E coli (ETEC) NOT DETECTED NOT DETECTED Final   Shiga like toxin producing E coli (STEC) NOT DETECTED NOT DETECTED Final   Shigella/Enteroinvasive E coli (EIEC) NOT DETECTED NOT DETECTED Final   Cryptosporidium NOT DETECTED NOT DETECTED Final   Cyclospora cayetanensis NOT DETECTED NOT DETECTED Final   Entamoeba histolytica NOT DETECTED NOT DETECTED Final   Giardia lamblia NOT DETECTED NOT DETECTED Final   Adenovirus F40/41 NOT DETECTED NOT DETECTED Final   Astrovirus NOT DETECTED NOT DETECTED Final   Norovirus GI/GII NOT DETECTED NOT DETECTED Final   Rotavirus A NOT DETECTED NOT DETECTED Final   Sapovirus (I, II, IV, and V) NOT DETECTED NOT DETECTED Final         Radiology Studies: No results found.      Scheduled Meds: . amLODipine  5 mg Oral Daily  . ARIPiprazole  20 mg Oral QHS  . aspirin EC  81 mg Oral Daily  . atorvastatin  20 mg Oral QPC supper  . donepezil  5 mg Oral QHS  . dorzolamide-timolol  1 drop Both Eyes BID  . heparin  5,000 Units Subcutaneous Q8H  . hydrOXYzine  50 mg Oral QHS  . insulin aspart  0-5 Units Subcutaneous QHS  . insulin aspart  0-9 Units Subcutaneous  TID WC  . lamoTRIgine  100 mg Oral QPM  . latanoprost  1 drop Both Eyes QHS  . magnesium oxide  200 mg Oral QODAY  . mirtazapine  7.5 mg Oral QHS  . mometasone-formoterol  2 puff Inhalation BID  . pantoprazole  40 mg Oral Daily  . [START ON 06/16/2017] predniSONE  20 mg Oral QAC breakfast  . predniSONE  40 mg Oral Q breakfast  . sildenafil  20 mg Oral TID   Continuous Infusions: . sodium chloride 50 mL/hr at 06/13/17 0813     LOS: 4 days    Time spent: 40 minutes    Demontray Franta, MD [facility] Pager [phone]  If 7PM-7AM, please contact night-coverage [email or web address] Password [email or web address] 06/13/2017, 9:21 AM

## 2017-06-13 NOTE — Progress Notes (Signed)
Ref: Everardo Beals, NP   Subjective:  Good diuresis. Improving renal function with IV fluids.  Objective:  Vital Signs in the last 24 hours: Temp:  [97.8 F (36.6 C)-98.6 F (37 C)] 98.5 F (36.9 C) (07/01 0600) Pulse Rate:  [70-86] 76 (07/01 0600) Cardiac Rhythm: Normal sinus rhythm (06/30 1900) Resp:  [14-18] 14 (07/01 0600) BP: (115-137)/(53-74) 137/74 (07/01 0600) SpO2:  [85 %-100 %] 98 % (07/01 0600) FiO2 (%):  [3 %] 3 % (06/30 1230)  Physical Exam: BP Readings from Last 1 Encounters:  06/13/17 137/74    Wt Readings from Last 1 Encounters:  06/12/17 93.5 kg (206 lb 3.2 oz)    Weight change:  Body mass index is 34.31 kg/m. HEENT: [place]/AT, Eyes-Brown, PERL, EOMI, Conjunctiva-Pink, Sclera-Non-icteric Neck: No JVD, No bruit, Trachea midline. Lungs:  Clearing, Bilateral. Cardiac:  Regular rhythm, normal S1 and S2, no S3. II/VI systolic murmur. Abdomen:  Soft, non-tender. BS present. Extremities:  No edema present. No cyanosis. No clubbing. CNS: AxOx3, Cranial nerves grossly intact, moves all 4 extremities.  Skin: Warm and dry.   Intake/Output from previous day: 06/30 0701 - 07/01 0700 In: 1595 [P.O.:840; I.V.:355] Out: 2300 [Urine:2300]    Lab Results: BMET    Component Value Date/Time   NA 136 06/13/2017 0354   NA 133 (L) 06/12/2017 0251   NA 130 (L) 06/11/2017 0425   NA 142 09/05/2014 1459   K 5.7 (H) 06/13/2017 0354   K 4.2 06/12/2017 0251   K 4.5 06/11/2017 0425   CL 105 06/13/2017 0354   CL 101 06/12/2017 0251   CL 98 (L) 06/11/2017 0425   CO2 21 (L) 06/13/2017 0354   CO2 22 06/12/2017 0251   CO2 20 (L) 06/11/2017 0425   GLUCOSE 87 06/13/2017 0354   GLUCOSE 152 (H) 06/12/2017 0251   GLUCOSE 258 (H) 06/11/2017 0425   BUN 51 (H) 06/13/2017 0354   BUN 55 (H) 06/12/2017 0251   BUN 55 (H) 06/11/2017 0425   BUN 9 09/05/2014 1459   CREATININE 2.07 (H) 06/13/2017 0354   CREATININE 2.70 (H) 06/12/2017 0251   CREATININE 2.60 (H) 06/11/2017 0425   CALCIUM 9.4 06/13/2017 0354   CALCIUM 9.2 06/12/2017 0251   CALCIUM 9.3 06/11/2017 0425   GFRNONAA 24 (L) 06/13/2017 0354   GFRNONAA 17 (L) 06/12/2017 0251   GFRNONAA 18 (L) 06/11/2017 0425   GFRAA 28 (L) 06/13/2017 0354   GFRAA 20 (L) 06/12/2017 0251   GFRAA 21 (L) 06/11/2017 0425   CBC    Component Value Date/Time   WBC 7.4 06/13/2017 0354   RBC 4.34 06/13/2017 0354   HGB 10.5 (L) 06/13/2017 0354   HCT 34.3 (L) 06/13/2017 0354   PLT 361 06/13/2017 0354   MCV 79.0 06/13/2017 0354   MCH 24.2 (L) 06/13/2017 0354   MCHC 30.6 06/13/2017 0354   RDW 15.7 (H) 06/13/2017 0354   LYMPHSABS 1.6 06/13/2017 0354   MONOABS 0.8 06/13/2017 0354   EOSABS 0.0 06/13/2017 0354   BASOSABS 0.0 06/13/2017 0354   HEPATIC Function Panel  Recent Labs  06/11/17 0425 06/12/17 0251 06/13/17 0354  PROT 6.8 6.4* 6.4*   HEMOGLOBIN A1C No components found for: HGA1C,  MPG CARDIAC ENZYMES Lab Results  Component Value Date   TROPONINI 0.04 (HH) 06/10/2017   TROPONINI 0.08 (HH) 06/09/2017   TROPONINI 0.09 (HH) 06/09/2017   BNP  Recent Labs  05/04/17 1634  PROBNP 160.0*   TSH  Recent Labs  05/04/17 1634  TSH 1.60  CHOLESTEROL No results for input(s): CHOL in the last 8760 hours.  Scheduled Meds: . amLODipine  5 mg Oral Daily  . ARIPiprazole  20 mg Oral QHS  . aspirin EC  81 mg Oral Daily  . atorvastatin  20 mg Oral QPC supper  . donepezil  5 mg Oral QHS  . dorzolamide-timolol  1 drop Both Eyes BID  . heparin  5,000 Units Subcutaneous Q8H  . hydrOXYzine  50 mg Oral QHS  . insulin aspart  0-5 Units Subcutaneous QHS  . insulin aspart  0-9 Units Subcutaneous TID WC  . lamoTRIgine  100 mg Oral QPM  . latanoprost  1 drop Both Eyes QHS  . magnesium oxide  200 mg Oral QODAY  . mirtazapine  7.5 mg Oral QHS  . mometasone-formoterol  2 puff Inhalation BID  . pantoprazole  40 mg Oral Daily  . [START ON 06/16/2017] predniSONE  20 mg Oral QAC breakfast  . predniSONE  40 mg Oral Q  breakfast  . sildenafil  20 mg Oral TID   Continuous Infusions: . sodium chloride     PRN Meds:.acetaminophen **OR** acetaminophen, ipratropium-albuterol, ondansetron (ZOFRAN) IV, polyethylene glycol, senna  Assessment/Plan: Acute on chronic diastolic heart failure Chronic cor pulmonale DM, II Hypertension Hyperlipidemia Obesity COPD CKD, III  Continue gentle hydration Increase activity.   LOS: 4 days    Dixie Dials  MD  06/13/2017, 7:45 AM

## 2017-06-14 ENCOUNTER — Encounter (HOSPITAL_COMMUNITY): Payer: Self-pay | Admitting: Cardiovascular Disease

## 2017-06-14 LAB — GLUCOSE, CAPILLARY
GLUCOSE-CAPILLARY: 122 mg/dL — AB (ref 65–99)
Glucose-Capillary: 239 mg/dL — ABNORMAL HIGH (ref 65–99)

## 2017-06-14 LAB — RENAL FUNCTION PANEL
Albumin: 3.9 g/dL (ref 3.5–5.0)
Anion gap: 8 (ref 5–15)
BUN: 34 mg/dL — AB (ref 6–20)
CHLORIDE: 109 mmol/L (ref 101–111)
CO2: 24 mmol/L (ref 22–32)
Calcium: 10 mg/dL (ref 8.9–10.3)
Creatinine, Ser: 1.74 mg/dL — ABNORMAL HIGH (ref 0.44–1.00)
GFR calc Af Amer: 34 mL/min — ABNORMAL LOW (ref >60)
GFR, EST NON AFRICAN AMERICAN: 29 mL/min — AB (ref >60)
GLUCOSE: 120 mg/dL — AB (ref 65–99)
POTASSIUM: 4.2 mmol/L (ref 3.5–5.1)
Phosphorus: 3.7 mg/dL (ref 2.5–4.6)
Sodium: 141 mmol/L (ref 135–145)

## 2017-06-14 MED ORDER — ALBUTEROL SULFATE (2.5 MG/3ML) 0.083% IN NEBU: 2.5000 mg | INHALATION_SOLUTION | Freq: Four times a day (QID) | RESPIRATORY_TRACT | Status: DC | PRN

## 2017-06-14 MED ORDER — PREDNISONE 20 MG PO TABS: 20.0000 mg | ORAL_TABLET | Freq: Every day | ORAL | 0 refills | Status: DC

## 2017-06-14 MED ORDER — FUROSEMIDE 40 MG PO TABS: 40.0000 mg | ORAL_TABLET | Freq: Two times a day (BID) | ORAL | 0 refills | Status: DC

## 2017-06-14 MED ORDER — GLIMEPIRIDE 4 MG PO TABS: 2.0000 mg | ORAL_TABLET | Freq: Every day | ORAL | 0 refills | Status: DC

## 2017-06-14 MED ORDER — SILDENAFIL CITRATE 20 MG PO TABS: 20.0000 mg | ORAL_TABLET | Freq: Three times a day (TID) | ORAL | 0 refills | Status: DC

## 2017-06-14 MED ORDER — ASPIRIN 81 MG PO TBEC: 81.0000 mg | DELAYED_RELEASE_TABLET | Freq: Every day | ORAL | 0 refills | Status: DC

## 2017-06-14 MED ORDER — FUROSEMIDE 40 MG PO TABS
40.0000 mg | ORAL_TABLET | Freq: Two times a day (BID) | ORAL | Status: DC
  Administered 2017-06-14: 40 mg via ORAL
  Filled 2017-06-14: qty 1

## 2017-06-14 NOTE — Discharge Summary (Signed)
Physician Discharge Summary  Elizabeth Thompson:[id number] DOB: 09-22-1951 DOA: 06/09/2017  PCP: Everardo Beals, NP  Admit date: 06/09/2017 Discharge date: 06/14/2017  Admitted From: Home  Disposition:  Home   Recommendations for Outpatient Follow-up:  1. Follow up with PCP in 1-2 weeks 2. Please obtain BMP/CBC in one week 3. Please follow up on the following pending results:  Home Health: yes.   Discharge Condition: Stable.  CODE STATUS: Full code.  Diet recommendation: Carb Modified   Brief/Interim Summary: 66 year old lady with prior h/o hypertension, DM, copd on 2 to 3 lit of [place] oxygen at baseline, GERD, hypercalcemia, bipolar disorder, depression, renal cell carcinoma, s/p nephrectomy on the left , was sent to ED by Dr Melvyn Novas for evaluation of PE. Her CTA was negative for PE, but significant for interstitial edema. She was admitted for evaluation of acute on chronic respiratory failure, possibly secondary to acute diastolic heart failure, underwent cardiac catheterization on 6/29  showing severe pulmonary hypertension. She was started on sildenafil and is currently on prednisone taper. During her hospitalization, pt developed acute on CKD stage 3, currently getting gentle hydration to improve her renal function.   Assessment & Plan:   Active Problems:   Diabetes mellitus type 2, controlled, without complications (HCC)   HLD (hyperlipidemia)   COPD GOLD 0    Essential hypertension   GERD (gastroesophageal reflux disease)   Depression   Dyspnea on exertion   Renal cell carcinoma, left (HCC)   Chronic renal disease, stage III   Acute on chronic respiratory failure (HCC)   Cor pulmonale, acute (HCC)   Elevated troponin  Acute on chronic diastolic HF exacerbation;  Resume lasix 40 mg BID, discussed with Dr Einar Gip.  Needs repeat renal function.  Outpatient pulmonary rehab per cardiology   Acute on chronic respiratory failure secondary to severe pulmonary hypertension:   Underwent cardiac cath on 6/29, showing the same.  Started on revatio,  [place] oxygen to keep sats greater than 90%.  Further management as per cardiology.    COPD exacerbation:  On prednisone taper.  Resume dulera   Acute on stage 3 CKD:  Probably from diuresis and contrast given during cardiac cath.  Improved.  Resume lasix per cardiology recommendation. Needs close follow up/    Hypertension:  Controlled.  Discontinue Norvasc as recommended by cardiology.    Diabetes mellitus:  Resume glipizide half dose due to AKI.  Hold metformin at discharge.   Depression : resume home meds.   Hyperkalemia:  Resolved.  Received kayexalate.    Discharge Diagnoses:  Active Problems:   Diabetes mellitus type 2, controlled, without complications (HCC)   HLD (hyperlipidemia)   COPD GOLD 0    Essential hypertension   GERD (gastroesophageal reflux disease)   Depression   Dyspnea on exertion   Renal cell carcinoma, left (HCC)   Chronic renal disease, stage III   Acute on chronic respiratory failure (HCC)   Cor pulmonale, acute (HCC)   Elevated troponin    Discharge Instructions  Discharge Instructions    Diet - low sodium heart healthy    Complete by:  As directed    Increase activity slowly    Complete by:  As directed      Allergies as of 06/14/2017      Reactions   Codeine Nausea And Vomiting   Prednisone Other (See Comments)   Has to monitor because she is a diabetic       Medication List  STOP taking these medications   amLODipine 10 MG tablet Commonly known as:  NORVASC   metFORMIN 1000 MG tablet Commonly known as:  GLUCOPHAGE     TAKE these medications   acetaminophen 650 MG CR tablet Commonly known as:  TYLENOL Take 1,300 mg by mouth every 12 (twelve) hours. What changed:  Another medication with the same name was removed. Continue taking this medication, and follow the directions you see here.   albuterol (2.5 MG/3ML) 0.083% nebulizer  solution Commonly known as:  PROVENTIL Take 2.5 mg by nebulization every 4 (four) hours as needed for wheezing or shortness of breath. PLAN C   PROAIR HFA 108 (90 Base) MCG/ACT inhaler Generic drug:  albuterol Inhale 2 puffs into the lungs every 6 (six) hours as needed for wheezing or shortness of breath.   ARIPiprazole 20 MG tablet Commonly known as:  ABILIFY Take 1 tablet (20 mg total) by mouth at bedtime.   aspirin 81 MG EC tablet Take 1 tablet (81 mg total) by mouth daily. Start taking on:  06/15/2017   atorvastatin 20 MG tablet Commonly known as:  LIPITOR Take 20 mg by mouth daily after supper.   bisacodyl 10 MG suppository Commonly known as:  DULCOLAX Place 1 suppository (10 mg total) rectally daily as needed for moderate constipation. What changed:  reasons to take this   budesonide-formoterol 160-4.5 MCG/ACT inhaler Commonly known as:  SYMBICORT Inhale 2 puffs into the lungs 2 (two) times daily.   COMBIVENT RESPIMAT 20-100 MCG/ACT Aers respimat Generic drug:  Ipratropium-Albuterol Inhale 1 puff into the lungs every 4 (four) hours as needed for wheezing. PLAN B   COQ10 PO Take 1 capsule by mouth every other day.   donepezil 5 MG tablet Commonly known as:  ARICEPT Take 1 tablet (5 mg total) by mouth at bedtime.   dorzolamide-timolol 22.3-6.8 MG/ML ophthalmic solution Commonly known as:  COSOPT Place 1 drop into both eyes 2 (two) times daily.   FLUTTER Devi Use as directed.   furosemide 40 MG tablet Commonly known as:  LASIX Take 1 tablet (40 mg total) by mouth 2 (two) times daily at 10 AM and 5 PM.   glimepiride 4 MG tablet Commonly known as:  AMARYL Take 0.5 tablets (2 mg total) by mouth daily with breakfast. What changed:  how much to take   glucose blood test strip Commonly known as:  ACCU-CHEK AVIVA 1 each by Other route 2 (two) times daily.   hydrOXYzine 25 MG tablet Commonly known as:  ATARAX/VISTARIL Take 50 mg by mouth at bedtime.    lactulose 10 GM/15ML solution Commonly known as:  CHRONULAC Take 30 mLs (20 g total) by mouth 2 (two) times daily as needed for mild constipation.   lamoTRIgine 100 MG tablet Commonly known as:  LAMICTAL Take 1 tablet (100 mg total) by mouth every evening. What changed:  when to take this   latanoprost 0.005 % ophthalmic solution Commonly known as:  XALATAN Place 1 drop into both eyes at bedtime.   Magnesium 250 MG Tabs Take 250 mg by mouth every other day.   mirtazapine 7.5 MG tablet Commonly known as:  REMERON Take 1 tablet (7.5 mg total) by mouth at bedtime.   MUCINEX MAXIMUM STRENGTH 1200 MG Tb12 Generic drug:  Guaifenesin Take 1,200 mg by mouth 2 (two) times daily.   OXYGEN Inhale 3-4 L into the lungs See admin instructions. 3 liters continuously and 4 liters if patient is ambulatory   polyethylene glycol packet  Commonly known as:  MIRALAX / GLYCOLAX Take 17 g by mouth daily. What changed:  when to take this  reasons to take this   predniSONE 20 MG tablet Commonly known as:  DELTASONE Take 1 tablet (20 mg total) by mouth daily before breakfast. Start taking on:  06/16/2017   Selenium Sulf-Pyrithione-Urea 2.25 % Sham Apply 1 application topically every 14 (fourteen) days. To scalp; for dermatitis   senna 8.6 MG tablet Commonly known as:  SENOKOT Take 1 tablet by mouth daily as needed for constipation.   sildenafil 20 MG tablet Commonly known as:  REVATIO Take 1 tablet (20 mg total) by mouth 3 (three) times daily.            Durable Medical Equipment        Start     Ordered   06/14/17 1104  For home use only DME Bedside commode  Once    Question:  Patient needs a bedside commode to treat with the following condition  Answer:  Weakness   06/14/17 1104   06/11/17 0724  For home use only DME 4 wheeled rolling walker with seat  Once    Question:  Patient needs a walker to treat with the following condition  Answer:  CHF (congestive heart failure)  ([place])   06/11/17 0724     Follow-up Information    Everardo Beals, NP Follow up.   Contact information: [facility] [place] [address] [phone]        Adrian Prows, MD Follow up.   Specialty:  Cardiology Contact information: [address] [place] [address] [phone]          Allergies  Allergen Reactions  . Codeine Nausea And Vomiting  . Prednisone Other (See Comments)    Has to monitor because she is a diabetic     Consultations:  Cardiology    Procedures/Studies: Dg Chest 2 View  Result Date: 06/10/2017 CLINICAL DATA:  Shortness of breath EXAM: CHEST  2 VIEW COMPARISON:  05/04/2017 chest radiograph and prior studies. 06/09/2017 chest CT FINDINGS: Cardiomegaly and interstitial prominence again noted. There is no evidence of focal airspace disease, pulmonary edema, suspicious pulmonary nodule/mass, pleural effusion, or pneumothorax. No acute bony abnormalities are identified. IMPRESSION: Cardiomegaly without evidence of acute abnormality. Unchanged interstitial opacities. Electronically Signed   By: Margarette Canada M.D.   On: 06/10/2017 15:18   Ct Angio Chest Pe W And/or Wo Contrast  Result Date: 06/09/2017 CLINICAL DATA:  66 year old female with shortness of breath. Personal history left renal cell carcinoma status post cryoablation in 2017. EXAM: CT ANGIOGRAPHY CHEST WITH CONTRAST TECHNIQUE: Multidetector CT imaging of the chest was performed using the standard protocol during bolus administration of intravenous contrast. Multiplanar CT image reconstructions and MIPs were obtained to evaluate the vascular anatomy. CONTRAST:  80 mL Isovue 370 COMPARISON:  Chest radiographs 05/04/2017. Chest CT 11/10/2016. Chest CTA 02/11/2016. Restaging CT Abdomen 05/13/2017. FINDINGS: Cardiovascular: Good contrast bolus timing in the pulmonary arterial tree. Mild respiratory motion artifact in the costophrenic angles. No focal  filling defect identified in the pulmonary arteries to suggest acute pulmonary embolism. Mild cardiomegaly. No pericardial effusion. No aortic contrast today. Calcified aortic and coronary artery atherosclerosis is evident. Mediastinum/Nodes: Small hilar and maximal mediastinal lymph nodes are stable since February 2017, nonspecific. No new or increased lymphadenopathy. Lungs/Pleura: Centrilobular emphysema. Mild septal thickening in the lung apices. Atelectatic changes to the major airways. Mosaic attenuation in the lower  lobes. No consolidation. No pleural effusion. Upper Abdomen: Negative visualized liver, spleen, pancreas and bowel in the upper abdomen. Visible renal nodularity appears stable. Musculoskeletal: Upper thoracic spina bifida occulta , normal variant. L1 spina bifida occulta. No acute osseous abnormality identified. Review of the MIP images confirms the above findings. IMPRESSION: 1.  No evidence of acute pulmonary embolus. 2. Emphysema with superimposed mild septal thickening and ground-glass opacity/mosaic attenuation. Consider mild or developing interstitial edema versus acute viral/atypical respiratory infection. 3. Superimposed mild chronic mediastinal and hilar lymphadenopathy is probably postinflammatory. Thoracic sarcoidosis is a less likely possibility. 4. Cardiomegaly.  No pericardial or pleural effusion. 5. Aortic Atherosclerosis (ICD10-I70.0) and Emphysema (ICD10-J43.9). Electronically Signed   By: Genevie Ann M.D.   On: 06/09/2017 17:04    (Echo, Carotid, EGD, Colonoscopy, ERCP)    Subjective:   Discharge Exam: Vitals:   06/13/17 2111 06/14/17 0351  BP: 127/66 118/60  Pulse: 81 74  Resp: 19   Temp: 98 F (36.7 C) 97.8 F (36.6 C)   Vitals:   06/13/17 2035 06/13/17 2111 06/14/17 0351 06/14/17 0847  BP:  127/66 118/60   Pulse:  81 74   Resp:  19    Temp:  98 F (36.7 C) 97.8 F (36.6 C)   TempSrc:  Oral Oral   SpO2: 99% 100% 97% 95%  Weight:   94.5 kg (208 lb 4.8  oz)   Height:        General: Pt is alert, awake, not in acute distress Cardiovascular: RRR, S1/S2 +, no rubs, no gallops Respiratory: CTA bilaterally, no wheezing, no rhonchi Abdominal: Soft, NT, ND, bowel sounds + Extremities: no edema, no cyanosis    The results of significant diagnostics from this hospitalization (including imaging, microbiology, ancillary and laboratory) are listed below for reference.     Microbiology: Recent Results (from the past 240 hour(s))  C difficile quick scan w PCR reflex     Status: None   Collection Time: 06/09/17  3:50 PM  Result Value Ref Range Status   C Diff antigen NEGATIVE NEGATIVE Final   C Diff toxin NEGATIVE NEGATIVE Final   C Diff interpretation No C. difficile detected.  Final  Gastrointestinal Panel by PCR , Stool     Status: None   Collection Time: 06/09/17  3:50 PM  Result Value Ref Range Status   Campylobacter species NOT DETECTED NOT DETECTED Final   Plesimonas shigelloides NOT DETECTED NOT DETECTED Final   Salmonella species NOT DETECTED NOT DETECTED Final   Yersinia enterocolitica NOT DETECTED NOT DETECTED Final   Vibrio species NOT DETECTED NOT DETECTED Final   Vibrio cholerae NOT DETECTED NOT DETECTED Final   Enteroaggregative E coli (EAEC) NOT DETECTED NOT DETECTED Final   Enteropathogenic E coli (EPEC) NOT DETECTED NOT DETECTED Final   Enterotoxigenic E coli (ETEC) NOT DETECTED NOT DETECTED Final   Shiga like toxin producing E coli (STEC) NOT DETECTED NOT DETECTED Final   Shigella/Enteroinvasive E coli (EIEC) NOT DETECTED NOT DETECTED Final   Cryptosporidium NOT DETECTED NOT DETECTED Final   Cyclospora cayetanensis NOT DETECTED NOT DETECTED Final   Entamoeba histolytica NOT DETECTED NOT DETECTED Final   Giardia lamblia NOT DETECTED NOT DETECTED Final   Adenovirus F40/41 NOT DETECTED NOT DETECTED Final   Astrovirus NOT DETECTED NOT DETECTED Final   Norovirus GI/GII NOT DETECTED NOT DETECTED Final   Rotavirus A NOT  DETECTED NOT DETECTED Final   Sapovirus (I, II, IV, and V) NOT DETECTED NOT DETECTED Final  Labs: BNP (last 3 results)  Recent Labs  06/09/17 2358 06/11/17 0425 06/12/17 0251  BNP 1,001.6* 478.5* 239.5*   Basic Metabolic Panel:  Recent Labs Lab 06/10/17 0750 06/11/17 0425 06/12/17 0251 06/13/17 0354 06/13/17 1851 06/14/17 0631  NA 133* 130* 133* 136  --  141  K 5.0 4.5 4.2 5.7* 5.0 4.2  CL 104 98* 101 105  --  109  CO2 18* 20* 22 21*  --  24  GLUCOSE 235* 258* 152* 87  --  120*  BUN 42* 55* 55* 51*  --  34*  CREATININE 1.92* 2.60* 2.70* 2.07*  --  1.74*  CALCIUM 9.6 9.3 9.2 9.4  --  10.0  PHOS  --   --   --   --   --  3.7   Liver Function Tests:  Recent Labs Lab 06/09/17 1435 06/11/17 0425 06/12/17 0251 06/13/17 0354 06/14/17 0631  AST 40 _0 --   ALT 48 35 33 28  --   ALKPHOS 63 64 60 56  --   BILITOT 0.7 0.6 0.4 1.2  --   PROT 6.7 6.8 6.4* 6.4*  --   ALBUMIN 3.9 3.7 3.6 3.4* 3.9   No results for input(s): LIPASE, AMYLASE in the last 168 hours. No results for input(s): AMMONIA in the last 168 hours. CBC:  Recent Labs Lab 06/09/17 1435 06/10/17 0750 06/11/17 0425 06/12/17 0251 06/13/17 0354  WBC 7.7 6.1 6.2 8.2 7.4  NEUTROABS 5.7  --   --   --  5.1  HGB 10.6* 11.1* 10.2* 10.2* 10.5*  HCT 34.3* 34.4* 32.6* 32.9* 34.3*  MCV 78.0 76.8* 78.4 78.1 79.0  PLT 393 374 457* 414* 361   Cardiac Enzymes:  Recent Labs Lab 06/09/17 1435 06/09/17 2000 06/10/17 0750  TROPONINI 0.09* 0.08* 0.04*   BNP: Invalid input(s): POCBNP CBG:  Recent Labs Lab 06/13/17 0724 06/13/17 1221 06/13/17 1636 06/13/17 2109 06/14/17 0731  GLUCAP 118* 228* 278* 213* 122*   D-Dimer No results for input(s): DDIMER in the last 72 hours. Hgb A1c No results for input(s): HGBA1C in the last 72 hours. Lipid Profile  Recent Labs  06/13/17 0728  CHOL 166  HDL 61  LDLCALC 91  TRIG 72  CHOLHDL 2.7   Thyroid function studies No results for input(s):  TSH, T4TOTAL, T3FREE, THYROIDAB in the last 72 hours.  Invalid input(s): FREET3 Anemia work up No results for input(s): VITAMINB12, FOLATE, FERRITIN, TIBC, IRON, RETICCTPCT in the last 72 hours. Urinalysis    Component Value Date/Time   COLORURINE COLORLESS (A) 06/09/2017 1419   APPEARANCEUR CLEAR 06/09/2017 1419   LABSPEC 1.005 06/09/2017 1419   PHURINE 5.0 06/09/2017 1419   GLUCOSEU NEGATIVE 06/09/2017 1419   HGBUR NEGATIVE 06/09/2017 1419   BILIRUBINUR NEGATIVE 06/09/2017 1419   KETONESUR NEGATIVE 06/09/2017 1419   PROTEINUR 100 (A) 06/09/2017 1419   UROBILINOGEN 0.2 08/17/2015 0109   NITRITE NEGATIVE 06/09/2017 1419   LEUKOCYTESUR NEGATIVE 06/09/2017 1419   Sepsis Labs Invalid input(s): PROCALCITONIN,  WBC,  LACTICIDVEN Microbiology Recent Results (from the past 240 hour(s))  C difficile quick scan w PCR reflex     Status: None   Collection Time: 06/09/17  3:50 PM  Result Value Ref Range Status   C Diff antigen NEGATIVE NEGATIVE Final   C Diff toxin NEGATIVE NEGATIVE Final   C Diff interpretation No C. difficile detected.  Final  Gastrointestinal Panel by PCR , Stool     Status: None  Collection Time: 06/09/17  3:50 PM  Result Value Ref Range Status   Campylobacter species NOT DETECTED NOT DETECTED Final   Plesimonas shigelloides NOT DETECTED NOT DETECTED Final   Salmonella species NOT DETECTED NOT DETECTED Final   Yersinia enterocolitica NOT DETECTED NOT DETECTED Final   Vibrio species NOT DETECTED NOT DETECTED Final   Vibrio cholerae NOT DETECTED NOT DETECTED Final   Enteroaggregative E coli (EAEC) NOT DETECTED NOT DETECTED Final   Enteropathogenic E coli (EPEC) NOT DETECTED NOT DETECTED Final   Enterotoxigenic E coli (ETEC) NOT DETECTED NOT DETECTED Final   Shiga like toxin producing E coli (STEC) NOT DETECTED NOT DETECTED Final   Shigella/Enteroinvasive E coli (EIEC) NOT DETECTED NOT DETECTED Final   Cryptosporidium NOT DETECTED NOT DETECTED Final   Cyclospora  cayetanensis NOT DETECTED NOT DETECTED Final   Entamoeba histolytica NOT DETECTED NOT DETECTED Final   Giardia lamblia NOT DETECTED NOT DETECTED Final   Adenovirus F40/41 NOT DETECTED NOT DETECTED Final   Astrovirus NOT DETECTED NOT DETECTED Final   Norovirus GI/GII NOT DETECTED NOT DETECTED Final   Rotavirus A NOT DETECTED NOT DETECTED Final   Sapovirus (I, II, IV, and V) NOT DETECTED NOT DETECTED Final     Time coordinating discharge: Over 30 minutes  SIGNED:   Elmarie Shiley, MD  [facility] 06/14/2017, 11:19 AM Pager   If 7PM-7AM, please contact night-coverage [email or web address] Password [email or web address]

## 2017-06-14 NOTE — Progress Notes (Signed)
HHC has been arranged with [facility]; rollater (rolling walker with seat) ordered for home today and to be delivered today to the room prior to discharging home; B Kingston RN,MHA,BSN [phone]

## 2017-06-14 NOTE — Progress Notes (Signed)
Subjective:  Feels much better. Dyspnea has improved.  The patient and her niece wondering about cardiopulmonary rehabilitation.  Objective:   Blood pressure 118/60, pulse 74, temperature 97.8 F (36.6 C), temperature source Oral, resp. rate 19, height _0  (1.651 m), weight 94.5 kg (208 lb 4.8 oz), SpO2 95 %. Body mass index is 34.66 kg/m.   Intake/Output from previous day: 07/01 0701 - 07/02 0700 In: 1200 [P.O.:1200] Out: 2777 [Urine:2775; Stool:2]  Physical Exam:   General appearance: alert, cooperative, appears older than stated age, no distress and moderately obese Eyes: negative findings: lids and lashes normal Neck: no adenopathy, no carotid bruit, no JVD, supple, symmetrical, trachea midline and thyroid not enlarged, symmetric, no tenderness/mass/nodules Neck: JVP - normal, carotids 2+= without bruits Resp: clear to auscultation bilaterally Chest wall: no tenderness Cardio: regular rate and rhythm, S1, S2 normal, no murmur, click, rub or gallop GI: soft, non-tender; bowel sounds normal; no masses,  no organomegaly and Obese Extremities: extremities normal, atraumatic, no cyanosis or edema    Lab Results: BMP  Recent Labs  06/12/17 0251 06/13/17 0354 06/13/17 1851 06/14/17 0631  NA 133* 136  --  141  K 4.2 5.7* 5.0 4.2  CL 101 105  --  109  CO2 22 21*  --  24  GLUCOSE 152* 87  --  120*  BUN 55* 51*  --  34*  CREATININE 2.70* 2.07*  --  1.74*  CALCIUM 9.2 9.4  --  10.0  GFRNONAA 17* 24*  --  29*  GFRAA 20* 28*  --  34*    CBC  Recent Labs Lab 06/13/17 0354  WBC 7.4  RBC 4.34  HGB 10.5*  HCT 34.3*  PLT 361  MCV 79.0  MCH 24.2*  MCHC 30.6  RDW 15.7*  LYMPHSABS 1.6  MONOABS 0.8  EOSABS 0.0  BASOSABS 0.0    HEMOGLOBIN A1C Lab Results  Component Value Date   HGBA1C 5.4 02/02/2017   MPG 108 02/02/2017    Cardiac Panel (last 3 results)  Recent Labs  06/09/17 1435 06/09/17 2000 06/10/17 0750  TROPONINI 0.09* 0.08* 0.04*    BNP  (last 3 results)  Recent Labs  05/04/17 1634  PROBNP 160.0*    TSH  Recent Labs  05/04/17 1634  TSH 1.60    Lipid Panel     Component Value Date/Time   CHOL 166 06/13/2017 0728   TRIG 72 06/13/2017 0728   HDL 61 06/13/2017 0728   CHOLHDL 2.7 06/13/2017 0728   VLDL 14 06/13/2017 0728   LDLCALC 91 06/13/2017 0728   LDLDIRECT 142.0 03/19/2016 1129     Hepatic Function Panel  Recent Labs  06/11/17 0425 06/12/17 0251 06/13/17 0354 06/14/17 0631  PROT 6.8 6.4* 6.4*  --   ALBUMIN 3.7 3.6 3.4* 3.9  AST _1 --   ALT 35 33 28  --   ALKPHOS 64 60 56  --   BILITOT 0.6 0.4 1.2  --     Imaging: Imaging results have been reviewed  Scheduled Meds: . amLODipine  5 mg Oral Daily  . ARIPiprazole  20 mg Oral QHS  . aspirin EC  81 mg Oral Daily  . atorvastatin  20 mg Oral QPC supper  . donepezil  5 mg Oral QHS  . dorzolamide-timolol  1 drop Both Eyes BID  . heparin  5,000 Units Subcutaneous Q8H  . hydrOXYzine  50 mg Oral QHS  . insulin aspart  0-5 Units Subcutaneous QHS  .  insulin aspart  0-9 Units Subcutaneous TID WC  . lamoTRIgine  100 mg Oral QPM  . latanoprost  1 drop Both Eyes QHS  . magnesium oxide  200 mg Oral QODAY  . mirtazapine  7.5 mg Oral QHS  . mometasone-formoterol  2 puff Inhalation BID  . pantoprazole  40 mg Oral Daily  . [START ON 06/16/2017] predniSONE  20 mg Oral QAC breakfast  . predniSONE  40 mg Oral Q breakfast  . sildenafil  20 mg Oral TID   Continuous Infusions: PRN Meds:.acetaminophen **OR** acetaminophen, albuterol, ipratropium-albuterol, ondansetron (ZOFRAN) IV, polyethylene glycol, senna   Cardiac Studies: Left and right heart cath 06/11/2017: Distal RCA 70% stenosis, ostial right PDA 50% stenosis, otherwise mild noncritical CAD.  Right heart catheterization consistent with severe pulmonary hypertension with elevated pulmonary Artery wedge and LVEDP.  RA mean 25, RVEDP 20, PA 77/44, mean 63 mmHg.  PW main 29 mmHg.  EDP 29  mmHg.  EKG 06/09/2017: Normal sinus rhythm at rate of 92 bpm, rightward axis, nonspecific T-wave flattening.  Lexiscan myoview stress test 12/25/2016: 1. The resting electrocardiogram demonstrated normal sinus rhythm, normal resting conduction, no resting arrhythmias and normal rest repolarization. Stress EKG is non-diagnostic for ischemia as it a pharmacologic stress using Lexiscan. Stress symptoms included dyspnea. 2. The perfusion imaging study demonstrates breast attenuation artifact in the inferior wall and apical thinning but no demonstrable ischemia or scar. There is normal wall motion in all vascular territories, LVEF was calculated at 61%. This represents a low risk study.  Echocardiogram 06/09/2017: - Left ventricle: The cavity size was normal. Wall thickness wasincreased in a pattern of mild LVH. Systolic function was normal.The estimated ejection fraction was in the range of 55% to 60%.Wall motion was normal; there were no regional wall motion abnormalities. Left ventricular diastolic function parameterswere normal.- Ventricular septum: Septal flattening consistant with RV pressureoverload. - Right ventricle: The cavity size was moderately dilated. Wallthickness was normal. Normal systolic function - Right atrium: The atrium was moderately dilated. - Atrial septum: No defect or patent foramen ovale was identified. Echo contrast study showed no right-to-left atrial level shunt, at baseline or with provocation. - Pulmonary arteries: PA peak pressure: 89 mm Hg (S). - Impressions: Right sided morphology consistant  with cor pulmonale and severe pulmonary HTN by TR estimate Compared to the echocardiogram done 09/06/2016, right ventricular findings are new.  Pulmonary hypertension is new.  ASSESSMENT AND PLAN:  1.  Acute on chronic diastolic heart failure now improved. Breathing better and leg edema resolved.  2.  Chronic cor pulmonale probably related to  underlying COPD and morbid obesity. However due to recent worsening dyspnea hypoxemia, severe pulmonary hypertension,need to exclude primary pulmonary hypertension. Needs repeat right heart cath once Patient is adequately diuresed. 3. Diabetes mellitus type II controlled 4. Hypertension 5. Hyperlipidemia 6. Centrilobular emphysema 7. Stage 3 CKD due to DM, HTN and h/o left upper pole cryoablation for renal cell carcinoma.  Recommendation: Changed to furosemide 40 mg q. a.m., discontinue amlodipine, may increase shunting across dead space leading to hypoxemia.  I have had lengthy discussion regarding making lifestyle changes.  Can be referred out to pulmonary rehabilitation in the outpatient basis.  Although patient has been started on Sildenafil, based on the hemodynamics, she may not qualify for treatment of primary pulmonary hypertension, cardiac catheterization needs to be repeated once she is adequately diuresed, only right heart catheterization.  From cardiac standpoint otherwise she remained stable, I'll see her back in the office for close  follow-up.  I still believe that she probably has WHO class III pulmonary hypertension.  Adrian Prows, M.D. 06/14/2017, 9:28 AM [facility] Pager: [phone] Office: [phone] If no answer: [phone]

## 2017-06-14 NOTE — Progress Notes (Signed)
Pt has orders to be discharged. Discharge instructions given and pt has no additional questions at this time. Medication regimen reviewed and pt educated. Pt verbalized understanding and has no additional questions. Telemetry box removed. IV removed and site in good condition. Pt stable and waiting for transportation.  Rick Warnick RN 

## 2017-06-14 NOTE — Care Management Important Message (Signed)
Important Message  Patient Details  Name: Elizabeth Thompson MRN: [id number] Date of Birth: 20-Nov-1951   Medicare Important Message Given:  Yes    Orbie Pyo 06/14/2017, 2:45 PM

## 2017-06-22 ENCOUNTER — Other Ambulatory Visit: Payer: Self-pay | Admitting: *Deleted

## 2017-06-22 NOTE — Patient Outreach (Signed)
[facility]) Care Management  06/22/2017  EMALIA WITKOP [birth year] [id number]  Referral via [place] List: Primary care provider is not [facility] provider; not eligible for [facility] services.  Plan: Send to care management assistant to close out.   Sherrin Daisy, RN BSN [place] Management Coordinator [facility] Care Management  [phone]

## 2017-06-23 DIAGNOSIS — C642 Malignant neoplasm of left kidney, except renal pelvis: Secondary | ICD-10-CM | POA: Diagnosis not present

## 2017-06-23 DIAGNOSIS — R0609 Other forms of dyspnea: Secondary | ICD-10-CM | POA: Diagnosis not present

## 2017-06-23 DIAGNOSIS — I10 Essential (primary) hypertension: Secondary | ICD-10-CM | POA: Diagnosis not present

## 2017-06-23 DIAGNOSIS — I272 Pulmonary hypertension, unspecified: Secondary | ICD-10-CM | POA: Diagnosis not present

## 2017-07-01 ENCOUNTER — Ambulatory Visit: Payer: Medicare Other | Admitting: Podiatry

## 2017-07-02 ENCOUNTER — Emergency Department (HOSPITAL_COMMUNITY): Payer: Medicare Other

## 2017-07-02 ENCOUNTER — Inpatient Hospital Stay (HOSPITAL_COMMUNITY)
Admission: EM | Admit: 2017-07-02 | Discharge: 2017-07-13 | DRG: 640 | Disposition: A | Discharge status: Home / Self Care | Payer: Medicare Other | Attending: Family Medicine | Admitting: Family Medicine

## 2017-07-02 ENCOUNTER — Encounter (HOSPITAL_COMMUNITY): Payer: Self-pay

## 2017-07-02 DIAGNOSIS — Z9981 Dependence on supplemental oxygen: Secondary | ICD-10-CM

## 2017-07-02 DIAGNOSIS — G9341 Metabolic encephalopathy: Secondary | ICD-10-CM | POA: Diagnosis not present

## 2017-07-02 DIAGNOSIS — J9621 Acute and chronic respiratory failure with hypoxia: Secondary | ICD-10-CM | POA: Diagnosis not present

## 2017-07-02 DIAGNOSIS — N183 Chronic kidney disease, stage 3 unspecified: Secondary | ICD-10-CM | POA: Diagnosis present

## 2017-07-02 DIAGNOSIS — Z885 Allergy status to narcotic agent status: Secondary | ICD-10-CM

## 2017-07-02 DIAGNOSIS — Z79899 Other long term (current) drug therapy: Secondary | ICD-10-CM | POA: Diagnosis not present

## 2017-07-02 DIAGNOSIS — E11649 Type 2 diabetes mellitus with hypoglycemia without coma: Secondary | ICD-10-CM | POA: Diagnosis not present

## 2017-07-02 DIAGNOSIS — E119 Type 2 diabetes mellitus without complications: Secondary | ICD-10-CM

## 2017-07-02 DIAGNOSIS — Z7984 Long term (current) use of oral hypoglycemic drugs: Secondary | ICD-10-CM | POA: Diagnosis not present

## 2017-07-02 DIAGNOSIS — J449 Chronic obstructive pulmonary disease, unspecified: Secondary | ICD-10-CM | POA: Diagnosis present

## 2017-07-02 DIAGNOSIS — Z87898 Personal history of other specified conditions: Secondary | ICD-10-CM | POA: Diagnosis not present

## 2017-07-02 DIAGNOSIS — Z888 Allergy status to other drugs, medicaments and biological substances status: Secondary | ICD-10-CM | POA: Diagnosis not present

## 2017-07-02 DIAGNOSIS — R0603 Acute respiratory distress: Secondary | ICD-10-CM

## 2017-07-02 DIAGNOSIS — H409 Unspecified glaucoma: Secondary | ICD-10-CM | POA: Diagnosis not present

## 2017-07-02 DIAGNOSIS — R531 Weakness: Secondary | ICD-10-CM | POA: Diagnosis not present

## 2017-07-02 DIAGNOSIS — E871 Hypo-osmolality and hyponatremia: Principal | ICD-10-CM | POA: Diagnosis present

## 2017-07-02 DIAGNOSIS — J9611 Chronic respiratory failure with hypoxia: Secondary | ICD-10-CM

## 2017-07-02 DIAGNOSIS — Z7982 Long term (current) use of aspirin: Secondary | ICD-10-CM | POA: Diagnosis not present

## 2017-07-02 DIAGNOSIS — I272 Pulmonary hypertension, unspecified: Secondary | ICD-10-CM | POA: Diagnosis not present

## 2017-07-02 DIAGNOSIS — F259 Schizoaffective disorder, unspecified: Secondary | ICD-10-CM | POA: Diagnosis present

## 2017-07-02 DIAGNOSIS — Z90710 Acquired absence of both cervix and uterus: Secondary | ICD-10-CM | POA: Diagnosis not present

## 2017-07-02 DIAGNOSIS — L03114 Cellulitis of left upper limb: Secondary | ICD-10-CM | POA: Diagnosis present

## 2017-07-02 DIAGNOSIS — J9612 Chronic respiratory failure with hypercapnia: Secondary | ICD-10-CM

## 2017-07-02 DIAGNOSIS — R0902 Hypoxemia: Secondary | ICD-10-CM

## 2017-07-02 DIAGNOSIS — F0390 Unspecified dementia without behavioral disturbance: Secondary | ICD-10-CM | POA: Diagnosis present

## 2017-07-02 DIAGNOSIS — Z7952 Long term (current) use of systemic steroids: Secondary | ICD-10-CM | POA: Diagnosis not present

## 2017-07-02 DIAGNOSIS — R9431 Abnormal electrocardiogram [ECG] [EKG]: Secondary | ICD-10-CM | POA: Diagnosis not present

## 2017-07-02 DIAGNOSIS — Z87891 Personal history of nicotine dependence: Secondary | ICD-10-CM | POA: Diagnosis not present

## 2017-07-02 DIAGNOSIS — I10 Essential (primary) hypertension: Secondary | ICD-10-CM | POA: Diagnosis not present

## 2017-07-02 DIAGNOSIS — E1122 Type 2 diabetes mellitus with diabetic chronic kidney disease: Secondary | ICD-10-CM | POA: Diagnosis not present

## 2017-07-02 DIAGNOSIS — R404 Transient alteration of awareness: Secondary | ICD-10-CM | POA: Diagnosis not present

## 2017-07-02 DIAGNOSIS — E785 Hyperlipidemia, unspecified: Secondary | ICD-10-CM | POA: Diagnosis not present

## 2017-07-02 DIAGNOSIS — M79609 Pain in unspecified limb: Secondary | ICD-10-CM | POA: Diagnosis not present

## 2017-07-02 DIAGNOSIS — M6281 Muscle weakness (generalized): Secondary | ICD-10-CM | POA: Diagnosis not present

## 2017-07-02 DIAGNOSIS — I129 Hypertensive chronic kidney disease with stage 1 through stage 4 chronic kidney disease, or unspecified chronic kidney disease: Secondary | ICD-10-CM | POA: Diagnosis present

## 2017-07-02 DIAGNOSIS — F319 Bipolar disorder, unspecified: Secondary | ICD-10-CM | POA: Diagnosis present

## 2017-07-02 DIAGNOSIS — J9600 Acute respiratory failure, unspecified whether with hypoxia or hypercapnia: Secondary | ICD-10-CM | POA: Diagnosis not present

## 2017-07-02 LAB — OSMOLALITY: OSMOLALITY: 254 mosm/kg — AB (ref 275–295)

## 2017-07-02 LAB — I-STAT CG4 LACTIC ACID, ED
Lactic Acid, Venous: 1.16 mmol/L (ref 0.5–1.9)
Lactic Acid, Venous: 0.69 mmol/L (ref 0.5–1.9)

## 2017-07-02 LAB — URINALYSIS, ROUTINE W REFLEX MICROSCOPIC
BACTERIA UA: NONE SEEN
BILIRUBIN URINE: NEGATIVE
Glucose, UA: NEGATIVE mg/dL
Hgb urine dipstick: NEGATIVE
KETONES UR: NEGATIVE mg/dL
LEUKOCYTES UA: NEGATIVE
NITRITE: NEGATIVE
PROTEIN: 30 mg/dL — AB
SQUAMOUS EPITHELIAL / LPF: NONE SEEN
Specific Gravity, Urine: 1.006 (ref 1.005–1.030)
pH: 5 (ref 5.0–8.0)

## 2017-07-02 LAB — COMPREHENSIVE METABOLIC PANEL
ALBUMIN: 3.8 g/dL (ref 3.5–5.0)
ALK PHOS: 63 U/L (ref 38–126)
ALT: 18 U/L (ref 14–54)
AST: 33 U/L (ref 15–41)
Anion gap: 10 (ref 5–15)
BUN: 20 mg/dL (ref 6–20)
CALCIUM: 9.8 mg/dL (ref 8.9–10.3)
CO2: 21 mmol/L — AB (ref 22–32)
CREATININE: 1.98 mg/dL — AB (ref 0.44–1.00)
Chloride: 88 mmol/L — ABNORMAL LOW (ref 101–111)
GFR calc non Af Amer: 25 mL/min — ABNORMAL LOW (ref >60)
GFR, EST AFRICAN AMERICAN: 29 mL/min — AB (ref >60)
GLUCOSE: 71 mg/dL (ref 65–99)
Potassium: 4.1 mmol/L (ref 3.5–5.1)
SODIUM: 119 mmol/L — AB (ref 135–145)
Total Bilirubin: 0.4 mg/dL (ref 0.3–1.2)
Total Protein: 6.9 g/dL (ref 6.5–8.1)

## 2017-07-02 LAB — I-STAT TROPONIN, ED: Troponin i, poc: 0.04 ng/mL (ref 0.00–0.08)

## 2017-07-02 LAB — PHOSPHORUS: Phosphorus: 4.8 mg/dL — ABNORMAL HIGH (ref 2.5–4.6)

## 2017-07-02 LAB — CBC WITH DIFFERENTIAL/PLATELET
BASOS ABS: 0 10*3/uL (ref 0.0–0.1)
Basophils Relative: 0 %
EOS ABS: 0.2 10*3/uL (ref 0.0–0.7)
Eosinophils Relative: 3 %
HCT: 34.1 % — ABNORMAL LOW (ref 36.0–46.0)
HEMOGLOBIN: 11.1 g/dL — AB (ref 12.0–15.0)
LYMPHS ABS: 1.1 10*3/uL (ref 0.7–4.0)
Lymphocytes Relative: 24 %
MCH: 24.6 pg — AB (ref 26.0–34.0)
MCHC: 32.6 g/dL (ref 30.0–36.0)
MCV: 75.6 fL — ABNORMAL LOW (ref 78.0–100.0)
Monocytes Absolute: 0.8 10*3/uL (ref 0.1–1.0)
Monocytes Relative: 16 %
NEUTROS PCT: 57 %
Neutro Abs: 2.7 10*3/uL (ref 1.7–7.7)
Platelets: 382 10*3/uL (ref 150–400)
RBC: 4.51 MIL/uL (ref 3.87–5.11)
RDW: 15.3 % (ref 11.5–15.5)
WBC: 4.7 10*3/uL (ref 4.0–10.5)

## 2017-07-02 LAB — OSMOLALITY, URINE: OSMOLALITY UR: 191 mosm/kg — AB (ref 300–900)

## 2017-07-02 LAB — MAGNESIUM: Magnesium: 1.8 mg/dL (ref 1.7–2.4)

## 2017-07-02 LAB — SODIUM, URINE, RANDOM: Sodium, Ur: <10 mmol/L

## 2017-07-02 MED ORDER — ACETAMINOPHEN 650 MG RE SUPP: 650.0000 mg | Freq: Four times a day (QID) | RECTAL | Status: DC | PRN

## 2017-07-02 MED ORDER — ASPIRIN EC 81 MG PO TBEC
81.0000 mg | DELAYED_RELEASE_TABLET | Freq: Every day | ORAL | Status: DC
  Administered 2017-07-03 – 2017-07-13 (×11): 81 mg via ORAL
  Filled 2017-07-02 (×11): qty 1

## 2017-07-02 MED ORDER — LAMOTRIGINE 100 MG PO TABS
100.0000 mg | ORAL_TABLET | Freq: Every day | ORAL | Status: DC
  Administered 2017-07-02 – 2017-07-12 (×11): 100 mg via ORAL
  Filled 2017-07-02 (×11): qty 1

## 2017-07-02 MED ORDER — GLIMEPIRIDE 2 MG PO TABS
2.0000 mg | ORAL_TABLET | Freq: Every day | ORAL | Status: DC
  Administered 2017-07-03 – 2017-07-04 (×2): 2 mg via ORAL
  Filled 2017-07-02 (×2): qty 1

## 2017-07-02 MED ORDER — ARIPIPRAZOLE 10 MG PO TABS
20.0000 mg | ORAL_TABLET | Freq: Every day | ORAL | Status: DC
  Administered 2017-07-02 – 2017-07-12 (×11): 20 mg via ORAL
  Filled 2017-07-02 (×12): qty 2

## 2017-07-02 MED ORDER — MAGNESIUM 250 MG PO TABS: 250.0000 mg | ORAL_TABLET | ORAL | Status: DC

## 2017-07-02 MED ORDER — ATORVASTATIN CALCIUM 20 MG PO TABS
20.0000 mg | ORAL_TABLET | Freq: Every day | ORAL | Status: DC
  Administered 2017-07-02 – 2017-07-12 (×11): 20 mg via ORAL
  Filled 2017-07-02 (×11): qty 1

## 2017-07-02 MED ORDER — DORZOLAMIDE HCL-TIMOLOL MAL 2-0.5 % OP SOLN
1.0000 [drp] | Freq: Two times a day (BID) | OPHTHALMIC | Status: DC
  Administered 2017-07-02 – 2017-07-13 (×22): 1 [drp] via OPHTHALMIC
  Filled 2017-07-02 (×2): qty 10

## 2017-07-02 MED ORDER — IPRATROPIUM-ALBUTEROL 20-100 MCG/ACT IN AERS: 1.0000 | INHALATION_SPRAY | RESPIRATORY_TRACT | Status: DC | PRN

## 2017-07-02 MED ORDER — ENOXAPARIN SODIUM 30 MG/0.3ML ~~LOC~~ SOLN
30.0000 mg | SUBCUTANEOUS | Status: DC
  Administered 2017-07-02 – 2017-07-04 (×3): 30 mg via SUBCUTANEOUS
  Filled 2017-07-02 (×3): qty 0.3

## 2017-07-02 MED ORDER — ALBUTEROL SULFATE HFA 108 (90 BASE) MCG/ACT IN AERS: 2.0000 | INHALATION_SPRAY | Freq: Four times a day (QID) | RESPIRATORY_TRACT | Status: DC | PRN

## 2017-07-02 MED ORDER — MIRTAZAPINE 15 MG PO TABS
7.5000 mg | ORAL_TABLET | Freq: Every day | ORAL | Status: DC
  Administered 2017-07-02 – 2017-07-03 (×2): 7.5 mg via ORAL
  Filled 2017-07-02 (×2): qty 1

## 2017-07-02 MED ORDER — ACETAMINOPHEN 325 MG PO TABS
650.0000 mg | ORAL_TABLET | Freq: Four times a day (QID) | ORAL | Status: DC | PRN
  Administered 2017-07-06 – 2017-07-07 (×2): 650 mg via ORAL
  Filled 2017-07-02 (×2): qty 2

## 2017-07-02 MED ORDER — DONEPEZIL HCL 5 MG PO TABS
5.0000 mg | ORAL_TABLET | Freq: Every day | ORAL | Status: DC
  Administered 2017-07-02 – 2017-07-12 (×11): 5 mg via ORAL
  Filled 2017-07-02 (×11): qty 1

## 2017-07-02 MED ORDER — METFORMIN HCL 500 MG PO TABS
1000.0000 mg | ORAL_TABLET | Freq: Two times a day (BID) | ORAL | Status: DC
  Administered 2017-07-03 – 2017-07-04 (×3): 1000 mg via ORAL
  Filled 2017-07-02 (×4): qty 2

## 2017-07-02 MED ORDER — HYDROXYZINE HCL 25 MG PO TABS
50.0000 mg | ORAL_TABLET | Freq: Every day | ORAL | Status: DC
  Administered 2017-07-02 – 2017-07-07 (×6): 50 mg via ORAL
  Filled 2017-07-02 (×6): qty 2

## 2017-07-02 MED ORDER — SODIUM CHLORIDE 0.9% FLUSH
3.0000 mL | Freq: Two times a day (BID) | INTRAVENOUS | Status: DC
  Administered 2017-07-02 – 2017-07-07 (×7): 3 mL via INTRAVENOUS

## 2017-07-02 MED ORDER — IPRATROPIUM-ALBUTEROL 0.5-2.5 (3) MG/3ML IN SOLN
3.0000 mL | RESPIRATORY_TRACT | Status: DC | PRN
  Administered 2017-07-10: 3 mL via RESPIRATORY_TRACT
  Filled 2017-07-02: qty 3

## 2017-07-02 MED ORDER — SODIUM CHLORIDE 0.9 % IV BOLUS (SEPSIS): 500.0000 mL | Freq: Once | INTRAVENOUS | Status: DC

## 2017-07-02 MED ORDER — MOMETASONE FURO-FORMOTEROL FUM 200-5 MCG/ACT IN AERO
2.0000 | INHALATION_SPRAY | Freq: Two times a day (BID) | RESPIRATORY_TRACT | Status: DC
  Administered 2017-07-03 – 2017-07-13 (×17): 2 via RESPIRATORY_TRACT
  Filled 2017-07-02: qty 8.8

## 2017-07-02 MED ORDER — ALBUTEROL SULFATE (2.5 MG/3ML) 0.083% IN NEBU: 2.5000 mg | INHALATION_SOLUTION | RESPIRATORY_TRACT | Status: DC | PRN

## 2017-07-02 MED ORDER — MAGNESIUM OXIDE 400 (241.3 MG) MG PO TABS
200.0000 mg | ORAL_TABLET | Freq: Every day | ORAL | Status: DC
  Administered 2017-07-03 – 2017-07-13 (×11): 200 mg via ORAL
  Filled 2017-07-02 (×11): qty 1

## 2017-07-02 MED ORDER — LATANOPROST 0.005 % OP SOLN
1.0000 [drp] | Freq: Every day | OPHTHALMIC | Status: DC
  Administered 2017-07-02 – 2017-07-12 (×11): 1 [drp] via OPHTHALMIC
  Filled 2017-07-02: qty 2.5

## 2017-07-02 MED ORDER — SILDENAFIL CITRATE 20 MG PO TABS
20.0000 mg | ORAL_TABLET | Freq: Three times a day (TID) | ORAL | Status: DC
  Administered 2017-07-02 – 2017-07-13 (×32): 20 mg via ORAL
  Filled 2017-07-02 (×35): qty 1

## 2017-07-02 NOTE — H&P (Signed)
History and Physical    Elizabeth Thompson:[id number] DOB: [birth year] DOA: 07/02/2017   PCP: Everardo Beals, NP   Attending physician: Nehemiah Settle  Patient coming from/Resides with: Private residence/lives with niece  Chief Complaint: Lethargy-abnormal/low sodium obtained at outside facility  HPI: Elizabeth Thompson is a 66 y.o. female with medical history significant for COPD on chronic nasal cannula oxygen at 3 L/m, new diagnosis of severe pulmonary hypertension during most recent hospitalization with new start of BID Lasix and sildenafil, known diabetes on Amaryl and metformin, bipolar disorder on chronic psychiatric medications, and dyslipidemia. The patient was recently hospitalized with heart failure symptoms. She underwent right heart catheterization by Dr. Doylene Canard and was found to have severe pulmonary hypertension with subsequent started on Lasix 40 mg by mouth twice a day as well as sildenafil. She was discharged on 7/2 and at that time her sodium was 141, BUN 34 and creatinine 1.74. Her metformin was briefly held secondary to receipt of recent IV contrast and the cause of her chronic kidney disease her Amaryl doses which decreased from formula grams 2 mg during the hospitalization. Since discharge patient has followed up with PCP where apparently on 7/11 she was told that her sodium level was "low". No adjustments in medications made. She presented today to [facility] complaining of generalized weakness. Subsequent lab data revealed a significant decrease in sodium to 119 with normal renal function for this patient. Patient's chest x-ray was stable with persistent mild pulmonary interstitial congestion. In addition to low-sodium patient was more lethargic than baseline in complaining of some mild nausea.  ED Course:  Vital Signs: BP 106/81   Pulse 94   Temp 98.5 F (36.9 C) (Oral)   Resp 18   Ht _0  (1.6 m)   Wt 90.7 kg (200 lb)   SpO2 98%   BMI 35.43 kg/m  2  view CXR: As above Lab data: Sodium 119, potassium 4.1, chloride 88, CO2 21, glucose 71, BUN 20, creatinine 1.98, anion gap 10, calcium 9.8, POC troponin 0.04, lactic acid 1.16, white count 4700 with normal differential, hemoglobin 1.1, platelets 382,000 Medications and treatments: None  Review of Systems:  In addition to the HPI above,  No Fever-chills, myalgias or other constitutional symptoms No Headache, changes with Vision or hearing, new weakness, tingling, numbness in any extremity, dizziness, dysarthria or word finding difficulty, gait disturbance or imbalance, tremors or seizure activity; she has been reporting generalized weakness No problems swallowing food or Liquids, indigestion/reflux, choking or coughing while eating, abdominal pain with or after eating No Chest pain, Cough or Shortness of Breath, palpitations, orthopnea or DOE No Abdominal pain, emesis, melena,hematochezia, dark tarry stools, constipation No dysuria, malodorous urine, hematuria or flank pain No new skin rashes, lesions, masses or bruises, No new joint pains, aches, swelling or redness No recent unintentional weight gain or loss No polyuria, polydypsia or polyphagia   Past Medical History:  Diagnosis Date  . Acute respiratory failure ([place]) 06/09/2017  . Arthritis   . Back pain   . Bell's palsy   . Bipolar disorder ([place])   . Bronchitis   . Chronic low back pain 05/09/2015  . COPD (chronic obstructive pulmonary disease) ([place])    hyoxia during sleep- using O2 as needed  . Cough with expectoration 05/21/16   recent diagnosis of bronchitis  . Cyst of right kidney   . Diabetes mellitus without complication ([place])    type 2  . Frequency of urination   .  GERD (gastroesophageal reflux disease)   . Glaucoma   . History of blood transfusion   . History of colon polyps   . History of hiatal hernia   . History of tobacco use   . Hypercalcemia   . Hyperlipidemia   . Hypertension   . Memory disorder 09/05/2014    . On home oxygen therapy    3L/M [place] at night   . Schizo-affective psychosis ([place])   . Shortness of breath dyspnea    exertion, or with out oxygen  . Uterine fibroid     Past Surgical History:  Procedure Laterality Date  . ABDOMINAL HYSTERECTOMY    . APPENDECTOMY    . COLONOSCOPY W/ POLYPECTOMY    . IR GENERIC HISTORICAL  04/29/2016   IR RADIOLOGIST EVAL & MGMT 04/29/2016 Corrie Mckusick, DO [facility] INTERV RAD  . IR GENERIC HISTORICAL  10/01/2016   IR RADIOLOGIST EVAL & MGMT 10/01/2016 [facility] INTERV [facility]  . IR GENERIC HISTORICAL  11/11/2016   IR RADIOLOGIST EVAL & MGMT 11/11/2016 Corrie Mckusick, DO [facility] INTER[facility]  . IR GENERIC HISTORICAL  07/16/2016   IR RADIOLOGIST EVAL & MGMT 07/16/2016 Corrie Mckusick, DO [facility] INTER[facility] RAD  . IR RADIOLOGIST EVAL & MGMT  03/10/2017  . IR RADIOLOGIST EVAL & MGMT  05/13/2017  . LUMBAR LAMINECTOMY/DECOMPRESSION MICRODISCECTOMY N/A 08/14/2015   Procedure: LUMBAR DECOMPRESSION MICRODISCECTOMY L3-S1;  Surgeon: Melina Schools, MD;  Location: [place];  Service: Orthopedics;  Laterality: N/A;  . RADIOFREQUENCY ABLATION Left 02/05/2017   Procedure: LEFT RENAL CRYOABLATION;  Surgeon: Corrie Mckusick, DO;  Location: [facility];  Service: Anesthesiology;  Laterality: Left;  . RIGHT/LEFT HEART CATH AND CORONARY ANGIOGRAPHY N/A 06/11/2017   Procedure: Right/Left Heart Cath and Coronary Angiography;  Surgeon: Dixie Dials, MD;  Location: [place] [facility];  Service: Cardiovascular;  Laterality: N/A;  . TONSILLECTOMY AND ADENOIDECTOMY      Social History   Social History  . Marital status: Divorced    Spouse name: N/A  . Number of children: 1  . Years of education: college 4   Occupational History  . disabled    Social History Main Topics  . Smoking status: Former Smoker    Packs/day: 1.00    Years: 28.00    Types: Cigarettes    Quit date: 07/15/2015  . Smokeless tobacco: Never Used  . Alcohol use No  . Drug use: No  . Sexual activity: No   Other Topics Concern  . Not  on file   Social History Narrative   Patient is right handed.   Patient drinks 2 cups caffeine daily.    Mobility: Rollator Work history: Disabled/retired   Allergies  Allergen Reactions  . Codeine Nausea And Vomiting  . Prednisone Other (See Comments)    Has to monitor because she is a diabetic     Family History  Problem Relation Age of Onset  . Dementia Mother   . Alzheimer's disease Mother   . Heart disease Mother   . Hypertension Mother   . Diabetes Mother   . Diabetes Father   . Alzheimer's disease Sister   . Heart disease Brother   . Heart attack Brother   . Bladder Cancer Neg Hx   . Kidney cancer Neg Hx   . Prostate cancer Neg Hx      Prior to Admission medications   Medication Sig Start Date End Date Taking? Authorizing Provider  acetaminophen (TYLENOL) 650 MG CR tablet Take 1,300 mg by mouth every 12 (twelve)  hours.    [provider]  albuterol (PROAIR HFA) 108 (90 Base) MCG/ACT inhaler Inhale 2 puffs into the lungs every 6 (six) hours as needed for wheezing or shortness of breath.    [provider]  albuterol (PROVENTIL) (2.5 MG/3ML) 0.083% nebulizer solution Take 2.5 mg by nebulization every 4 (four) hours as needed for wheezing or shortness of breath. PLAN C    [provider]  ARIPiprazole (ABILIFY) 20 MG tablet Take 1 tablet (20 mg total) by mouth at bedtime. 08/27/14   Kerrie Buffalo, NP  aspirin EC 81 MG EC tablet Take 1 tablet (81 mg total) by mouth daily. 06/15/17   Regalado, Belkys A, MD  atorvastatin (LIPITOR) 20 MG tablet Take 20 mg by mouth daily after supper.    [provider]  bisacodyl (DULCOLAX) 10 MG suppository Place 1 suppository (10 mg total) rectally daily as needed for moderate constipation. Patient taking differently: Place 10 mg rectally daily as needed (for constipation).  09/08/16   Geradine Girt, DO  budesonide-formoterol (SYMBICORT) 160-4.5 MCG/ACT inhaler Inhale 2 puffs into the lungs 2 (two)  times daily.    [provider]  Coenzyme Q10 (COQ10 PO) Take 1 capsule by mouth every other day.     [provider]  donepezil (ARICEPT) 5 MG tablet Take 1 tablet (5 mg total) by mouth at bedtime. 08/27/14   Kerrie Buffalo, NP  dorzolamide-timolol (COSOPT) 22.3-6.8 MG/ML ophthalmic solution Place 1 drop into both eyes 2 (two) times daily.    [provider]  furosemide (LASIX) 40 MG tablet Take 1 tablet (40 mg total) by mouth 2 (two) times daily at 10 AM and 5 PM. 06/14/17   Regalado, Belkys A, MD  glimepiride (AMARYL) 4 MG tablet Take 0.5 tablets (2 mg total) by mouth daily with breakfast. 06/14/17   Regalado, Belkys A, MD  glucose blood (ACCU-CHEK AVIVA) test strip 1 each by Other route 2 (two) times daily. 08/04/16   Jearld Fenton, NP  Guaifenesin (MUCINEX MAXIMUM STRENGTH) 1200 MG TB12 Take 1,200 mg by mouth 2 (two) times daily.    [provider]  hydrOXYzine (ATARAX/VISTARIL) 25 MG tablet Take 50 mg by mouth at bedtime.  04/17/16   [provider]  Ipratropium-Albuterol (COMBIVENT RESPIMAT) 20-100 MCG/ACT AERS respimat Inhale 1 puff into the lungs every 4 (four) hours as needed for wheezing. PLAN B    [provider]  lactulose (CHRONULAC) 10 GM/15ML solution Take 30 mLs (20 g total) by mouth 2 (two) times daily as needed for mild constipation. 09/08/16   Geradine Girt, DO  lamoTRIgine (LAMICTAL) 100 MG tablet Take 1 tablet (100 mg total) by mouth every evening. Patient taking differently: Take 100 mg by mouth daily after supper.  08/27/14   Kerrie Buffalo, NP  latanoprost (XALATAN) 0.005 % ophthalmic solution Place 1 drop into both eyes at bedtime. 08/27/14   Kerrie Buffalo, NP  Magnesium 250 MG TABS Take 250 mg by mouth every other day.     [provider]  mirtazapine (REMERON) 7.5 MG tablet Take 1 tablet (7.5 mg total) by mouth at bedtime. 08/27/14   Kerrie Buffalo, NP  OXYGEN Inhale 3-4 L into the lungs See admin instructions. 3  liters continuously and 4 liters if patient is ambulatory    [provider]  polyethylene glycol (MIRALAX / GLYCOLAX) packet Take 17 g by mouth daily. Patient taking differently: Take 17 g by mouth daily as needed (for constipation).  09/08/16  Geradine Girt, DO  predniSONE (DELTASONE) 20 MG tablet Take 1 tablet (20 mg total) by mouth daily before breakfast. 06/16/17   Regalado, Cassie Freer, MD  Respiratory Therapy Supplies (FLUTTER) DEVI Use as directed. 05/26/17   Parrett, Fonnie Mu, NP  Selenium Sulf-Pyrithione-Urea 2.25 % SHAM Apply 1 application topically every 14 (fourteen) days. To scalp; for dermatitis 01/25/15   [provider]  senna (SENOKOT) 8.6 MG tablet Take 1 tablet by mouth daily as needed for constipation.     [provider]  sildenafil (REVATIO) 20 MG tablet Take 1 tablet (20 mg total) by mouth 3 (three) times daily. 06/14/17   Elmarie Shiley, MD    Physical Exam: Vitals:   07/02/17 1530 07/02/17 1545 07/02/17 1600 07/02/17 1615  BP: (!) 120/104 107/90 106/76 106/81  Pulse: 87 89 94 94  Resp: _0 Temp:      TempSrc:      SpO2: 94% 96% 97% 98%  Weight:      Height:          Constitutional: NAD, calm, comfortable Eyes: PERRL, lids and conjunctivae normal ENMT: Mucous membranes are moist. Posterior pharynx clear of any exudate or lesions.Normal dentition.  Neck: normal, supple, no masses, no thyromegaly Respiratory: clear to auscultation bilaterally, no wheezing, no crackles. Normal respiratory effort. No accessory muscle use.  Cardiovascular: Regular rate and rhythm, no murmurs / rubs / gallops. No extremity edema. 2+ pedal pulses. No carotid bruits.  Abdomen: no tenderness, no masses palpated. No hepatosplenomegaly. Bowel sounds positive.  Musculoskeletal: no clubbing / cyanosis. No joint deformity upper and lower extremities. Good ROM, no contractures. Normal muscle tone.  Skin: no rashes, lesions, ulcers. No induration Neurologic:  CN 2-12 grossly intact. Sensation intact, DTR Equivocal Strength 4-5/5 x all 4 extremities.  Psychiatric: Awake and oriented x name. Seems somewhat normal and niece had to assist patient with history portion of exam. Flat affect.   Labs on Admission: I have personally reviewed following labs and imaging studies  CBC:  Recent Labs Lab 07/02/17 1431  WBC 4.7  NEUTROABS 2.7  HGB 11.1*  HCT 34.1*  MCV 75.6*  PLT 333   Basic Metabolic Panel:  Recent Labs Lab 07/02/17 1431  NA 119*  K 4.1  CL 88*  CO2 21*  GLUCOSE 71  BUN 20  CREATININE 1.98*  CALCIUM 9.8   GFR: Estimated Creatinine Clearance: 29.9 mL/min (A) (by C-G formula based on SCr of 1.98 mg/dL (H)). Liver Function Tests:  Recent Labs Lab 07/02/17 1431  AST 33  ALT 18  ALKPHOS 63  BILITOT 0.4  PROT 6.9  ALBUMIN 3.8   No results for input(s): LIPASE, AMYLASE in the last 168 hours. No results for input(s): AMMONIA in the last 168 hours. Coagulation Profile: No results for input(s): INR, PROTIME in the last 168 hours. Cardiac Enzymes: No results for input(s): CKTOTAL, CKMB, CKMBINDEX, TROPONINI in the last 168 hours. BNP (last 3 results)  Recent Labs  05/04/17 1634  PROBNP 160.0*   HbA1C: No results for input(s): HGBA1C in the last 72 hours. CBG: No results for input(s): GLUCAP in the last 168 hours. Lipid Profile: No results for input(s): CHOL, HDL, LDLCALC, TRIG, CHOLHDL, LDLDIRECT in the last 72 hours. Thyroid Function Tests: No results for input(s): TSH, T4TOTAL, FREET4, T3FREE, THYROIDAB in the last 72 hours. Anemia Panel: No results for input(s): VITAMINB12, FOLATE, FERRITIN, TIBC, IRON, RETICCTPCT in the last 72 hours. Urine analysis:    Component  Value Date/Time   COLORURINE COLORLESS (A) 06/09/2017 1419   APPEARANCEUR CLEAR 06/09/2017 1419   LABSPEC 1.005 06/09/2017 1419   PHURINE 5.0 06/09/2017 1419   GLUCOSEU NEGATIVE 06/09/2017 1419   HGBUR NEGATIVE 06/09/2017 1419   BILIRUBINUR  NEGATIVE 06/09/2017 1419   KETONESUR NEGATIVE 06/09/2017 1419   PROTEINUR 100 (A) 06/09/2017 1419   UROBILINOGEN 0.2 08/17/2015 0109   NITRITE NEGATIVE 06/09/2017 1419   LEUKOCYTESUR NEGATIVE 06/09/2017 1419   Sepsis Labs: _0 (procalcitonin:4,lacticidven:4) )No results found for this or any previous visit (from the past 240 hour(s)).   Radiological Exams on Admission: Dg Chest 2 View  Result Date: 07/02/2017 CLINICAL DATA:  Initial evaluation for acute weakness. EXAM: CHEST  2 VIEW COMPARISON:  Prior radiograph from 06/10/2017. FINDINGS: Stable cardiomegaly.  Mediastinal silhouette within normal limits. Lungs mildly hypoinflated. Underlying COPD. Superimposed mild interstitial prominence suspected to reflect interstitial congestion, similar to previous. No focal infiltrates. No overt alveolar edema or pleural effusion. No pneumothorax. No acute osseus abnormality. IMPRESSION: 1. Stable cardiomegaly with mild diffuse interstitial prominence, favored to reflect mild pulmonary interstitial congestion. 2. Underlying COPD. Electronically Signed   By: Jeannine Boga M.D.   On: 07/02/2017 15:23    EKG: (Independently reviewed) sinus rhythm with ventricular rate 87 bpm, QTC 496 ms, normal R-wave rotation, voltage criteria met for LVH, no acute ischemic changes  Assessment/Plan Principal Problem:   Acute hyponatremia -Patient presents with altered mental status and generalized weakness and setting of significant hyponatremia with drop of sodium from previous reading of 141 on 7/2 to a reading of 119 today -Recently started on Lasix so this could be culprit -Has been on her psychotropic medications for an extended period of time with no medication adjustments so doubt these are contributing -Consider SIADH although patient does appear somewhat dry -Check urine osmolality and random urine sodium as well as serum osmolality -Hold Lasix -Patient was not orthostatic when checked from  supine to sitting, she was unable to stand -Uncertain etiology so we'll begin 1200 mL FR; niece reports patient does eat ice frequently at home and estimates about 600 mL of melted water daily on top of juices, Gatorade and so does she encourage the patient to drink -Will not utilize sodium was treated diet at this point -Avoid rapid overcorrection of sodium-if the degree of volume depletion suspected we'll likely give 500 mL initially over 4-6 hours -Follow electrolytes  Active Problems:   Acute metabolic encephalopathy -Secondary to acute hyponatremia -Neurological checks every 4 hours -No reported clonus or seizure activity at home    Hypertension -Current blood pressure somewhat suboptimal -Currently not on antihypertensive medications since her blood pressure was dropping with the initiation of Lasix and sildenafil during previous admission    Chronic respiratory failure with hypoxia, on home O2 therapy/COPD (chronic obstructive pulmonary disease)/Severe Pulmonary HTN  -Continue home O2 at 3 L/m -Continue sildenafil -Lasix on hold as above -Continue home nebulizers and inhalers    Diabetes mellitus without complication  -Due to CKD Amaryl was decreased during previous admission therefore will continue Amaryl 2 mg and metformin 1000 twice a day    Bipolar disorder /Dementia -Continue preadmission Abilify, Aricept and Lamictal as well as Remeron    Hyperlipidemia -Continue Lipitor    CKD (chronic kidney disease) stage 3, GFR 30-59 ml/min -Renal function stable -It is noted that on 7/1 her BUN was 51 with a creatinine of 2.7 and on 7/to her BUN was 34 with a creatinine of 1.74 -Current BUN low for  this patient at 54 with a creatinine of 1.98-could this possibly be reflective of hemodilution from SIADH? -Follow labs      DVT prophylaxis: Lovenox Code Status: Full (has previously been DO NOT RESUSCITATE so may need to discuss further with niece during admission-if truly is DO  NOT RESUSCITATE make sure yellow DO NOT RESUSCITATE form completed and given to the family prior to discharge) Family Communication: Niece Disposition Plan: Home Consults called: None    ELLIS,ALLISON L. ANP-BC [facility] Pager [phone]   If 7PM-7AM, please contact night-coverage [email or web address] Password [email or web address]  07/02/2017, 5:11 PM

## 2017-07-02 NOTE — ED Triage Notes (Signed)
[facility]- pt coming from [place] with complaint of generalized weakness. She was told on 7/11 that her sodium level was low. Pt also recently began taking lasix.

## 2017-07-02 NOTE — Progress Notes (Signed)
Elizabeth Thompson is a 66 y.o. female patient admitted from ED awake, alert - oriented  X 4 - no acute distress noted.  VSS - Blood pressure 108/68, pulse 90, temperature 98.5 F (36.9 C), temperature source Oral, resp. rate 16, height _0  (1.6 m), weight 90.7 kg (200 lb), SpO2 99 %.    IV in place, occlusive dsg intact without redness.  Orientation to room, and floor completed with information packet given to patient/family.   Admission INP armband ID verified with patient/family, and in place.   SR up x 2. Call light within reach, patient able to voice, and demonstrate understanding.      Will cont to eval and treat per MD orders.  Celine Ahr, RN 07/02/2017 7:04 PM

## 2017-07-02 NOTE — Progress Notes (Signed)
Received report on pt.

## 2017-07-02 NOTE — ED Provider Notes (Signed)
[facility] DEPT Provider Note   CSN: [id number] Arrival date & time: 07/02/17  1318     History   Chief Complaint Chief Complaint  Patient presents with  . Weakness    HPI Elizabeth Thompson is a 66 y.o. female.  HPI Elizabeth Thompson is a 66 y.o. female with history of COPD, pulmonary hypertension, diabetes, hypertension, schizoaffective disorder, on home oxygen 3 L at all times, presents to emergency department complaining of weakness. Patient states that she has been feeling increasingly weak over the last several days. Patient has been mainly sleeping all day. She has had trouble even sitting up on her own due to weakness. She denies any pain. She denies any fever or chills. No urinary symptoms. No cough or congestion. No new medications recently. She states she was seen by her doctor a few days ago was told her sodium is low. Patient lives at home with her niece who helps take care of her. Niece states that this is not patient's usual state. Recent start on lasix and sildenafil for severe pulmonary htn.  Past Medical History:  Diagnosis Date  . Acute respiratory failure ([place]) 06/09/2017  . Arthritis   . Back pain   . Bell's palsy   . Bipolar disorder ([place])   . Bronchitis   . Chronic low back pain 05/09/2015  . COPD (chronic obstructive pulmonary disease) ([place])    hyoxia during sleep- using O2 as needed  . Cough with expectoration 05/21/16   recent diagnosis of bronchitis  . Cyst of right kidney   . Diabetes mellitus without complication ([place])    type 2  . Frequency of urination   . GERD (gastroesophageal reflux disease)   . Glaucoma   . History of blood transfusion   . History of colon polyps   . History of hiatal hernia   . History of tobacco use   . Hypercalcemia   . Hyperlipidemia   . Hypertension   . Memory disorder 09/05/2014  . On home oxygen therapy    3L/M [place] at night   . Schizo-affective psychosis ([place])   . Shortness of breath dyspnea    exertion, or  with out oxygen  . Uterine fibroid     Patient Active Problem List   Diagnosis Date Noted  . Elevated troponin   . Cor pulmonale, acute ([place])   . Acute on chronic respiratory failure ([place]) 06/09/2017  . Chronic renal disease, stage III 05/05/2017  . Chronic respiratory failure with hypoxia ([place]) 05/05/2017  . Renal cell carcinoma, left ([place]) 02/05/2017  . Dyspnea on exertion 12/28/2016  . Depression 09/04/2016  . Renal malignant tumor ([place]) 06/26/2016  . GERD (gastroesophageal reflux disease) 03/22/2016  . Insomnia 03/22/2016  . COPD GOLD 0  12/06/2015  . Essential hypertension 12/06/2015  . COPD exacerbation ([place]) 11/20/2015  . Multiple lung nodules 11/20/2015  . Morbid obesity due to excess calories ([place]) 11/20/2015  . Hypersomnia with sleep apnea 11/20/2015  . Diabetes mellitus type 2, controlled, without complications ([place]) 42/35/3614  . HLD (hyperlipidemia) 11/06/2015  . Chronic low back pain 05/09/2015  . Gait disorder 05/09/2015  . Memory disorder 09/05/2014  . Schizoaffective disorder ([place]) 08/18/2014    Past Surgical History:  Procedure Laterality Date  . ABDOMINAL HYSTERECTOMY    . APPENDECTOMY    . COLONOSCOPY W/ POLYPECTOMY    . IR GENERIC HISTORICAL  04/29/2016   IR RADIOLOGIST EVAL & MGMT 04/29/2016 Corrie Mckusick, DO [facility] INTER[facility] RAD  . IR GENERIC  HISTORICAL  10/01/2016   IR RADIOLOGIST EVAL & MGMT 10/01/2016 [facility] INTER[facility]  . IR GENERIC HISTORICAL  11/11/2016   IR RADIOLOGIST EVAL & MGMT 11/11/2016 Corrie Mckusick, DO [facility]  . IR GENERIC HISTORICAL  07/16/2016   IR RADIOLOGIST EVAL & MGMT 07/16/2016 Corrie Mckusick, DO [facility]  . IR RADIOLOGIST EVAL & MGMT  03/10/2017  . IR RADIOLOGIST EVAL & MGMT  05/13/2017  . LUMBAR LAMINECTOMY/DECOMPRESSION MICRODISCECTOMY N/A 08/14/2015   Procedure: LUMBAR DECOMPRESSION MICRODISCECTOMY L3-S1;  Surgeon: Melina Schools, MD;  Location: [facility];  Service: Orthopedics;  Laterality: N/A;  . RADIOFREQUENCY ABLATION  Left 02/05/2017   Procedure: LEFT RENAL CRYOABLATION;  Surgeon: Corrie Mckusick, DO;  Location: [facility];  Service: Anesthesiology;  Laterality: Left;  . RIGHT/LEFT HEART CATH AND CORONARY ANGIOGRAPHY N/A 06/11/2017   Procedure: Right/Left Heart Cath and Coronary Angiography;  Surgeon: Dixie Dials, MD;  Location: [facility];  Service: Cardiovascular;  Laterality: N/A;  . TONSILLECTOMY AND ADENOIDECTOMY      OB History    No data available       Home Medications    Prior to Admission medications   Medication Sig Start Date End Date Taking? Authorizing Provider  acetaminophen (TYLENOL) 650 MG CR tablet Take 1,300 mg by mouth every 12 (twelve) hours.    [provider]  albuterol (PROAIR HFA) 108 (90 Base) MCG/ACT inhaler Inhale 2 puffs into the lungs every 6 (six) hours as needed for wheezing or shortness of breath.    [provider]  albuterol (PROVENTIL) (2.5 MG/3ML) 0.083% nebulizer solution Take 2.5 mg by nebulization every 4 (four) hours as needed for wheezing or shortness of breath. PLAN C    [provider]  ARIPiprazole (ABILIFY) 20 MG tablet Take 1 tablet (20 mg total) by mouth at bedtime. 08/27/14   Kerrie Buffalo, NP  aspirin EC 81 MG EC tablet Take 1 tablet (81 mg total) by mouth daily. 06/15/17   Regalado, Belkys A, MD  atorvastatin (LIPITOR) 20 MG tablet Take 20 mg by mouth daily after supper.    [provider]  bisacodyl (DULCOLAX) 10 MG suppository Place 1 suppository (10 mg total) rectally daily as needed for moderate constipation. Patient taking differently: Place 10 mg rectally daily as needed (for constipation).  09/08/16   Geradine Girt, DO  budesonide-formoterol (SYMBICORT) 160-4.5 MCG/ACT inhaler Inhale 2 puffs into the lungs 2 (two) times daily.    [provider]  Coenzyme Q10 (COQ10 PO) Take 1 capsule by mouth every other day.     [provider]  donepezil (ARICEPT) 5 MG tablet Take 1 tablet (5 mg total) by  mouth at bedtime. 08/27/14   Kerrie Buffalo, NP  dorzolamide-timolol (COSOPT) 22.3-6.8 MG/ML ophthalmic solution Place 1 drop into both eyes 2 (two) times daily.    [provider]  furosemide (LASIX) 40 MG tablet Take 1 tablet (40 mg total) by mouth 2 (two) times daily at 10 AM and 5 PM. 06/14/17   Regalado, Belkys A, MD  glimepiride (AMARYL) 4 MG tablet Take 0.5 tablets (2 mg total) by mouth daily with breakfast. 06/14/17   Regalado, Belkys A, MD  glucose blood (ACCU-CHEK AVIVA) test strip 1 each by Other route 2 (two) times daily. 08/04/16   Jearld Fenton, NP  Guaifenesin (MUCINEX MAXIMUM STRENGTH) 1200 MG TB12 Take 1,200 mg by mouth 2 (two) times daily.    [provider]  hydrOXYzine (ATARAX/VISTARIL) 25 MG tablet Take 50  mg by mouth at bedtime.  04/17/16   [provider]  Ipratropium-Albuterol (COMBIVENT RESPIMAT) 20-100 MCG/ACT AERS respimat Inhale 1 puff into the lungs every 4 (four) hours as needed for wheezing. PLAN B    [provider]  lactulose (CHRONULAC) 10 GM/15ML solution Take 30 mLs (20 g total) by mouth 2 (two) times daily as needed for mild constipation. 09/08/16   Geradine Girt, DO  lamoTRIgine (LAMICTAL) 100 MG tablet Take 1 tablet (100 mg total) by mouth every evening. Patient taking differently: Take 100 mg by mouth daily after supper.  08/27/14   Kerrie Buffalo, NP  latanoprost (XALATAN) 0.005 % ophthalmic solution Place 1 drop into both eyes at bedtime. 08/27/14   Kerrie Buffalo, NP  Magnesium 250 MG TABS Take 250 mg by mouth every other day.     [provider]  mirtazapine (REMERON) 7.5 MG tablet Take 1 tablet (7.5 mg total) by mouth at bedtime. 08/27/14   Kerrie Buffalo, NP  OXYGEN Inhale 3-4 L into the lungs See admin instructions. 3 liters continuously and 4 liters if patient is ambulatory    [provider]  polyethylene glycol (MIRALAX / GLYCOLAX) packet Take 17 g by mouth daily. Patient taking differently: Take 17 g  by mouth daily as needed (for constipation).  09/08/16   Geradine Girt, DO  predniSONE (DELTASONE) 20 MG tablet Take 1 tablet (20 mg total) by mouth daily before breakfast. 06/16/17   Regalado, Cassie Freer, MD  Respiratory Therapy Supplies (FLUTTER) DEVI Use as directed. 05/26/17   Parrett, Fonnie Mu, NP  Selenium Sulf-Pyrithione-Urea 2.25 % SHAM Apply 1 application topically every 14 (fourteen) days. To scalp; for dermatitis 01/25/15   [provider]  senna (SENOKOT) 8.6 MG tablet Take 1 tablet by mouth daily as needed for constipation.     [provider]  sildenafil (REVATIO) 20 MG tablet Take 1 tablet (20 mg total) by mouth 3 (three) times daily. 06/14/17   Regalado, Cassie Freer, MD    Family History Family History  Problem Relation Age of Onset  . Dementia Mother   . Alzheimer's disease Mother   . Heart disease Mother   . Hypertension Mother   . Diabetes Mother   . Diabetes Father   . Alzheimer's disease Sister   . Heart disease Brother   . Heart attack Brother   . Bladder Cancer Neg Hx   . Kidney cancer Neg Hx   . Prostate cancer Neg Hx     Social History Social History  Substance Use Topics  . Smoking status: Former Smoker    Packs/day: 1.00    Years: 28.00    Types: Cigarettes    Quit date: 07/15/2015  . Smokeless tobacco: Never Used  . Alcohol use No     Allergies   Codeine and Prednisone   Review of Systems Review of Systems  Constitutional: Positive for fatigue. Negative for chills and fever.  Respiratory: Negative for cough, chest tightness and shortness of breath.   Cardiovascular: Negative for chest pain, palpitations and leg swelling.  Gastrointestinal: Negative for abdominal pain, diarrhea, nausea and vomiting.  Genitourinary: Negative for dysuria, flank pain, pelvic pain, vaginal bleeding, vaginal discharge and vaginal pain.  Musculoskeletal: Negative for arthralgias, myalgias, neck pain and neck stiffness.  Skin: Negative for rash.    Neurological: Positive for weakness. Negative for dizziness and headaches.  All other systems reviewed and are negative.    Physical Exam Updated Vital Signs BP 106/62  Pulse 84   Temp 98.5 F (36.9 C) (Oral)   Resp 16   Ht _0  (1.6 m)   Wt 90.7 kg (200 lb)   SpO2 99%   BMI 35.43 kg/m   Physical Exam  Constitutional: She is oriented to person, place, and time. She appears well-developed and well-nourished. No distress.  Appears to be somnolent, falls asleep after speaking a few words at a time.  HENT:  Head: Normocephalic.  Eyes: Conjunctivae are normal.  Neck: Neck supple.  Cardiovascular: Normal rate, regular rhythm and normal heart sounds.   Pulmonary/Chest: Effort normal and breath sounds normal. No respiratory distress. She has no wheezes. She has no rales.  Abdominal: Soft. Bowel sounds are normal. She exhibits no distension. There is no tenderness. There is no rebound.  Musculoskeletal: She exhibits no edema.  Neurological: She is alert and oriented to person, place, and time.  5/5 and equal upper and lower extremity strength bilaterally. Equal grip strength bilaterally. Unable to do finger to nose due to somnolence. No pronator drift. Patellar reflexes 2+   Skin: Skin is warm and dry.  Psychiatric: She has a normal mood and affect. Her behavior is normal.  Nursing note and vitals reviewed.    ED Treatments / Results  Labs (all labs ordered are listed, but only abnormal results are displayed) Labs Reviewed  CBC WITH DIFFERENTIAL/PLATELET - Abnormal; Notable for the following:       Result Value   Hemoglobin 11.1 (*)    HCT 34.1 (*)    MCV 75.6 (*)    MCH 24.6 (*)    All other components within normal limits  COMPREHENSIVE METABOLIC PANEL - Abnormal; Notable for the following:    Sodium 119 (*)    Chloride 88 (*)    CO2 21 (*)    Creatinine, Ser 1.98 (*)    GFR calc non Af Amer 25 (*)    GFR calc Af Amer 29 (*)    All other components within normal  limits  URINALYSIS, ROUTINE W REFLEX MICROSCOPIC  SODIUM, URINE, RANDOM  OSMOLALITY, URINE  OSMOLALITY  I-STAT TROPONIN, ED  I-STAT CG4 LACTIC ACID, ED    EKG  EKG Interpretation  Date/Time:  Friday July 02 2017 15:33:25 EDT Ventricular Rate:  87 PR Interval:    QRS Duration: 106 QT Interval:  412 QTC Calculation: 496 R Axis:   120 Text Interpretation:  Sinus rhythm Borderline prolonged PR interval Right axis deviation Nonspecific T abnrm Borderline prolonged QT interval Confirmed by Lajean Saver [phone]) on 07/02/2017 3:48:33 PM       Radiology Dg Chest 2 View  Result Date: 07/02/2017 CLINICAL DATA:  Initial evaluation for acute weakness. EXAM: CHEST  2 VIEW COMPARISON:  Prior radiograph from 06/10/2017. FINDINGS: Stable cardiomegaly.  Mediastinal silhouette within normal limits. Lungs mildly hypoinflated. Underlying COPD. Superimposed mild interstitial prominence suspected to reflect interstitial congestion, similar to previous. No focal infiltrates. No overt alveolar edema or pleural effusion. No pneumothorax. No acute osseus abnormality. IMPRESSION: 1. Stable cardiomegaly with mild diffuse interstitial prominence, favored to reflect mild pulmonary interstitial congestion. 2. Underlying COPD. Electronically Signed   By: Jeannine Boga M.D.   On: 07/02/2017 15:23    Procedures Procedures (including critical care time)  Medications Ordered in ED Medications - No data to display   Initial Impression / Assessment and Plan / ED Course  I have reviewed the triage vital signs and the nursing notes.  Pertinent labs & imaging results that were available  during my care of the patient were reviewed by me and considered in my medical decision making (see chart for details).     Patient in emergency department generalized weakness, recent admission, diagnosed with pulmonary hypertension, started on sildenafil, Lasix. Just finished prednisone as well as a time. Patient states  she has also been not taking as much of oral sodium at home. Recent blood work by her PCP showed sodium that is low. We will recheck her electrolytes here. She is in no acute distress otherwise. She appears really weak on exam but able to move all the extremities bilaterally. She is oriented, she is able to provide history of a falls asleep. No other complaints.   Sodium today is 119. Will call medicine for admission. Urinalysis still pending.  4:24 PM Spoke with [facility], they will admit.  Final Clinical Impressions(s) / ED Diagnoses   Final diagnoses:  Hyponatremia  Weakness    New Prescriptions New Prescriptions   No medications on file     Jeannett Senior, Hershal Coria 07/03/17 1555    Mackuen, Fredia Sorrow, MD 07/06/17 1624

## 2017-07-03 LAB — CBC
HCT: 32.3 % — ABNORMAL LOW (ref 36.0–46.0)
HEMOGLOBIN: 10.3 g/dL — AB (ref 12.0–15.0)
MCH: 24.1 pg — AB (ref 26.0–34.0)
MCHC: 31.9 g/dL (ref 30.0–36.0)
MCV: 75.6 fL — ABNORMAL LOW (ref 78.0–100.0)
Platelets: 385 10*3/uL (ref 150–400)
RBC: 4.27 MIL/uL (ref 3.87–5.11)
RDW: 15.3 % (ref 11.5–15.5)
WBC: 4.9 10*3/uL (ref 4.0–10.5)

## 2017-07-03 LAB — BASIC METABOLIC PANEL
Anion gap: 11 (ref 5–15)
Anion gap: 9 (ref 5–15)
BUN: 24 mg/dL — AB (ref 6–20)
BUN: 24 mg/dL — AB (ref 6–20)
CHLORIDE: 87 mmol/L — AB (ref 101–111)
CHLORIDE: 89 mmol/L — AB (ref 101–111)
CO2: 21 mmol/L — AB (ref 22–32)
CO2: 22 mmol/L (ref 22–32)
CREATININE: 2.32 mg/dL — AB (ref 0.44–1.00)
CREATININE: 2.21 mg/dL — AB (ref 0.44–1.00)
Calcium: 9.5 mg/dL (ref 8.9–10.3)
Calcium: 9.3 mg/dL (ref 8.9–10.3)
GFR calc Af Amer: 24 mL/min — ABNORMAL LOW (ref >60)
GFR calc Af Amer: 26 mL/min — ABNORMAL LOW (ref >60)
GFR calc non Af Amer: 21 mL/min — ABNORMAL LOW (ref >60)
GFR calc non Af Amer: 22 mL/min — ABNORMAL LOW (ref >60)
GLUCOSE: 85 mg/dL (ref 65–99)
Glucose, Bld: 56 mg/dL — ABNORMAL LOW (ref 65–99)
Potassium: 3.8 mmol/L (ref 3.5–5.1)
Potassium: 4.3 mmol/L (ref 3.5–5.1)
SODIUM: 119 mmol/L — AB (ref 135–145)
SODIUM: 120 mmol/L — AB (ref 135–145)

## 2017-07-03 LAB — COMPREHENSIVE METABOLIC PANEL
ALBUMIN: 3.4 g/dL — AB (ref 3.5–5.0)
ALK PHOS: 56 U/L (ref 38–126)
ALT: 16 U/L (ref 14–54)
ANION GAP: 9 (ref 5–15)
AST: 31 U/L (ref 15–41)
BUN: 23 mg/dL — ABNORMAL HIGH (ref 6–20)
CALCIUM: 9.5 mg/dL (ref 8.9–10.3)
CO2: 22 mmol/L (ref 22–32)
Chloride: 91 mmol/L — ABNORMAL LOW (ref 101–111)
Creatinine, Ser: 2.05 mg/dL — ABNORMAL HIGH (ref 0.44–1.00)
GFR calc Af Amer: 28 mL/min — ABNORMAL LOW (ref >60)
GFR calc non Af Amer: 24 mL/min — ABNORMAL LOW (ref >60)
GLUCOSE: 55 mg/dL — AB (ref 65–99)
Potassium: 4 mmol/L (ref 3.5–5.1)
SODIUM: 122 mmol/L — AB (ref 135–145)
Total Bilirubin: 0.4 mg/dL (ref 0.3–1.2)
Total Protein: 6.3 g/dL — ABNORMAL LOW (ref 6.5–8.1)

## 2017-07-03 MED ORDER — SODIUM CHLORIDE 0.9 % IV SOLN
INTRAVENOUS | Status: DC
  Administered 2017-07-03: 16:00:00 via INTRAVENOUS

## 2017-07-03 MED ORDER — SODIUM CHLORIDE 1 G PO TABS
2.0000 g | ORAL_TABLET | Freq: Two times a day (BID) | ORAL | Status: DC
  Administered 2017-07-03 – 2017-07-04 (×2): 2 g via ORAL
  Filled 2017-07-03 (×3): qty 2

## 2017-07-03 MED ORDER — SODIUM CHLORIDE 1 G PO TABS
2.0000 g | ORAL_TABLET | Freq: Once | ORAL | Status: AC
  Administered 2017-07-03: 2 g via ORAL
  Filled 2017-07-03: qty 2

## 2017-07-03 NOTE — Progress Notes (Signed)
PROGRESS NOTE    DELIANA AVALOS  WNU:[id number] DOB: [birth year] DOA: 07/02/2017 PCP: Everardo Beals, NP   Brief Narrative:  [phone] patient on chronic O2, severe pulm HTN on lasix and sildenafil, DM2. Lethargic for several days. Seen by PCP - found sodium "low" and came to ED for evaluation   Assessment & Plan:   Principal Problem:   Acute hyponatremia -  Patient reportedly recently started on Lasix and she reports low sodium diet. - Lab work reviewed not consistent with SIADH. - Provide careful gentle fluid rehydration with normal saline  Active Problems:   Hypertension - Currently not on antihypertensive medications since her blood pressure was dropping with the initiation of Lasix and sildenafil during previous admission    Chronic respiratory failure with hypoxia, on home O2 therapy ([place]) - Patient has pulmonary hypertension is on 3 L supplemental oxygen. We'll continue sildenafil    COPD (chronic obstructive pulmonary disease) ([facility]) - no wheezes - continue duonebs, continue Dulera    Diabetes mellitus without complication ([place]) -  Diabetic diet - Patient is currently on metformin    Pulmonary HTN ([place]) - Lasix currently on hold, patient is on sildenafil    Bipolar disorder ([place]) - Continue home medication regimen.    Hyperlipidemia - Stable on statin    Acute metabolic encephalopathy - Resolving with improvement in serum creatinine    CKD (chronic kidney disease) stage 3, GFR 30-59 ml/min - Continue to monitor serum creatinine   DVT prophylaxis: Lovenox Code Status: Full Family Communication: discussed with patient Disposition Plan: continue to monitor sodium levels, with improvement in sodium levels to normal or near normal    Consultants:   None   Procedures: None   Antimicrobials: None   Subjective: Pt has no new complaints. Per family member patient is nearer to baseline mentation but not there yet.  Objective: Vitals:   07/03/17 0555  07/03/17 0557 07/03/17 0826 07/03/17 1339  BP:  112/76  (!) 100/58  Pulse:  79  85  Resp:  18  16  Temp:  97.9 F (36.6 C)  98.4 F (36.9 C)  TempSrc:    Oral  SpO2:  100% 98% 97%  Weight: 92.7 kg (204 lb 6.4 oz)     Height:        Intake/Output Summary (Last 24 hours) at 07/03/17 1517 Last data filed at 07/03/17 1257  Gross per 24 hour  Intake              480 ml  Output              500 ml  Net              -20 ml   Filed Weights   07/02/17 1440 07/02/17 1908 07/03/17 0555  Weight: 90.7 kg (200 lb) 94.8 kg (209 lb) 92.7 kg (204 lb 6.4 oz)    Examination:  General exam: Appears calm and comfortable, in nad. Respiratory system: Clear to auscultation. Respiratory effort normal. Cardiovascular system: S1 & S2 heard, RRR. No JVD, murmurs, rubs, or gallops Gastrointestinal system: Abdomen is nondistended, soft and nontender. Normal bowel sounds heard. Central nervous system: Alert and oriented. No focal neurological deficits. Extremities: Symmetric 5 x 5 power. Skin: No rashes, lesions or ulcers, on limited exam. Psychiatry:  Mood & affect appropriate.   Data Reviewed: I have personally reviewed following labs and imaging studies  CBC:  Recent Labs Lab 07/02/17 1431 07/03/17 0247  WBC 4.7 4.9  NEUTROABS 2.7  --  HGB 11.1* 10.3*  HCT 34.1* 32.3*  MCV 75.6* 75.6*  PLT 382 700   Basic Metabolic Panel:  Recent Labs Lab 07/02/17 1431 07/02/17 1830 07/03/17 0247  NA 119*  --  122*  K 4.1  --  4.0  CL 88*  --  91*  CO2 21*  --  22  GLUCOSE 71  --  55*  BUN 20  --  23*  CREATININE 1.98*  --  2.05*  CALCIUM 9.8  --  9.5  MG  --  1.8  --   PHOS  --  4.8*  --    GFR: Estimated Creatinine Clearance: 29.2 mL/min (A) (by C-G formula based on SCr of 2.05 mg/dL (H)). Liver Function Tests:  Recent Labs Lab 07/02/17 1431 07/03/17 0247  AST 33 31  ALT 18 16  ALKPHOS 63 56  BILITOT 0.4 0.4  PROT 6.9 6.3*  ALBUMIN 3.8 3.4*   No results for input(s): LIPASE,  AMYLASE in the last 168 hours. No results for input(s): AMMONIA in the last 168 hours. Coagulation Profile: No results for input(s): INR, PROTIME in the last 168 hours. Cardiac Enzymes: No results for input(s): CKTOTAL, CKMB, CKMBINDEX, TROPONINI in the last 168 hours. BNP (last 3 results)  Recent Labs  05/04/17 1634  PROBNP 160.0*   HbA1C: No results for input(s): HGBA1C in the last 72 hours. CBG: No results for input(s): GLUCAP in the last 168 hours. Lipid Profile: No results for input(s): CHOL, HDL, LDLCALC, TRIG, CHOLHDL, LDLDIRECT in the last 72 hours. Thyroid Function Tests: No results for input(s): TSH, T4TOTAL, FREET4, T3FREE, THYROIDAB in the last 72 hours. Anemia Panel: No results for input(s): VITAMINB12, FOLATE, FERRITIN, TIBC, IRON, RETICCTPCT in the last 72 hours. Sepsis Labs:  Recent Labs Lab 07/02/17 1438 07/02/17 1837  LATICACIDVEN 1.16 0.69    No results found for this or any previous visit (from the past 240 hour(s)).   Radiology Studies: Dg Chest 2 View  Result Date: 07/02/2017 CLINICAL DATA:  Initial evaluation for acute weakness. EXAM: CHEST  2 VIEW COMPARISON:  Prior radiograph from 06/10/2017. FINDINGS: Stable cardiomegaly.  Mediastinal silhouette within normal limits. Lungs mildly hypoinflated. Underlying COPD. Superimposed mild interstitial prominence suspected to reflect interstitial congestion, similar to previous. No focal infiltrates. No overt alveolar edema or pleural effusion. No pneumothorax. No acute osseus abnormality. IMPRESSION: 1. Stable cardiomegaly with mild diffuse interstitial prominence, favored to reflect mild pulmonary interstitial congestion. 2. Underlying COPD. Electronically Signed   By: Jeannine Boga M.D.   On: 07/02/2017 15:23    Scheduled Meds: . ARIPiprazole  20 mg Oral QHS  . aspirin EC  81 mg Oral Daily  . atorvastatin  20 mg Oral QPC supper  . donepezil  5 mg Oral QHS  . dorzolamide-timolol  1 drop Both Eyes  BID  . enoxaparin (LOVENOX) injection  30 mg Subcutaneous Q24H  . glimepiride  2 mg Oral Q breakfast  . hydrOXYzine  50 mg Oral QHS  . lamoTRIgine  100 mg Oral QPC supper  . latanoprost  1 drop Both Eyes QHS  . magnesium oxide  200 mg Oral Daily  . metFORMIN  1,000 mg Oral BID WC  . mirtazapine  7.5 mg Oral QHS  . mometasone-formoterol  2 puff Inhalation BID  . sildenafil  20 mg Oral TID  . sodium chloride flush  3 mL Intravenous Q12H   Continuous Infusions:   LOS: 1 day    Time spent: > 35 minutes  Ladan Vanderzanden, [place],  MD [facility] Pager [phone]  If 7PM-7AM, please contact night-coverage [email or web address] Password [email or web address] 07/03/2017, 3:17 PM

## 2017-07-03 NOTE — Progress Notes (Signed)
CRITICAL VALUE ALERT  Critical Value: Na 119  Date & Time Notied:  07/03/17 1732  Provider Notified: Dr. Wendee Beavers [id number]  Orders Received/Actions taken: Fluids stopped and Extra 2 mg sodium chloride tab ordered once  per Dr. Wendee Beavers

## 2017-07-03 NOTE — Progress Notes (Signed)
Ordered Extra 2 mg sodium chloride tab per Dr Wendee Beavers pt's. Na 119

## 2017-07-04 LAB — BASIC METABOLIC PANEL
ANION GAP: 8 (ref 5–15)
ANION GAP: 11 (ref 5–15)
ANION GAP: 11 (ref 5–15)
BUN: 22 mg/dL — AB (ref 6–20)
BUN: 22 mg/dL — AB (ref 6–20)
BUN: 22 mg/dL — AB (ref 6–20)
CHLORIDE: 92 mmol/L — AB (ref 101–111)
CHLORIDE: 92 mmol/L — AB (ref 101–111)
CHLORIDE: 93 mmol/L — AB (ref 101–111)
CO2: 22 mmol/L (ref 22–32)
CO2: 18 mmol/L — AB (ref 22–32)
CO2: 20 mmol/L — ABNORMAL LOW (ref 22–32)
Calcium: 9.3 mg/dL (ref 8.9–10.3)
Calcium: 9.2 mg/dL (ref 8.9–10.3)
Calcium: 9.3 mg/dL (ref 8.9–10.3)
Creatinine, Ser: 1.95 mg/dL — ABNORMAL HIGH (ref 0.44–1.00)
Creatinine, Ser: 1.96 mg/dL — ABNORMAL HIGH (ref 0.44–1.00)
Creatinine, Ser: 2.12 mg/dL — ABNORMAL HIGH (ref 0.44–1.00)
GFR calc Af Amer: 30 mL/min — ABNORMAL LOW (ref >60)
GFR calc Af Amer: 29 mL/min — ABNORMAL LOW (ref >60)
GFR calc Af Amer: 27 mL/min — ABNORMAL LOW (ref >60)
GFR calc non Af Amer: 26 mL/min — ABNORMAL LOW (ref >60)
GFR, EST NON AFRICAN AMERICAN: 25 mL/min — AB (ref >60)
GFR, EST NON AFRICAN AMERICAN: 23 mL/min — AB (ref >60)
GLUCOSE: 60 mg/dL — AB (ref 65–99)
GLUCOSE: 132 mg/dL — AB (ref 65–99)
GLUCOSE: 92 mg/dL (ref 65–99)
POTASSIUM: 4 mmol/L (ref 3.5–5.1)
POTASSIUM: 4 mmol/L (ref 3.5–5.1)
POTASSIUM: 4.2 mmol/L (ref 3.5–5.1)
Sodium: 122 mmol/L — ABNORMAL LOW (ref 135–145)
Sodium: 121 mmol/L — ABNORMAL LOW (ref 135–145)
Sodium: 124 mmol/L — ABNORMAL LOW (ref 135–145)

## 2017-07-04 LAB — BLOOD GAS, ARTERIAL
Acid-base deficit: 4 mmol/L — ABNORMAL HIGH (ref 0.0–2.0)
BICARBONATE: 21.1 mmol/L (ref 20.0–28.0)
DRAWN BY: 31306
O2 CONTENT: 3 L/min
O2 SAT: 94.9 %
PATIENT TEMPERATURE: 98.6
PO2 ART: 79.9 mmHg — AB (ref 83.0–108.0)
pCO2 arterial: 42.2 mmHg (ref 32.0–48.0)
pH, Arterial: 7.32 — ABNORMAL LOW (ref 7.350–7.450)

## 2017-07-04 LAB — GLUCOSE, CAPILLARY
GLUCOSE-CAPILLARY: 87 mg/dL (ref 65–99)
GLUCOSE-CAPILLARY: 124 mg/dL — AB (ref 65–99)
GLUCOSE-CAPILLARY: 49 mg/dL — AB (ref 65–99)
Glucose-Capillary: 52 mg/dL — ABNORMAL LOW (ref 65–99)
Glucose-Capillary: 161 mg/dL — ABNORMAL HIGH (ref 65–99)
Glucose-Capillary: 92 mg/dL (ref 65–99)

## 2017-07-04 MED ORDER — SODIUM CHLORIDE 1 G PO TABS
2.0000 g | ORAL_TABLET | Freq: Three times a day (TID) | ORAL | Status: DC
  Administered 2017-07-04 – 2017-07-07 (×8): 2 g via ORAL
  Filled 2017-07-04 (×10): qty 2

## 2017-07-04 MED ORDER — ENSURE ENLIVE PO LIQD
237.0000 mL | Freq: Two times a day (BID) | ORAL | Status: DC
  Administered 2017-07-09: 237 mL via ORAL

## 2017-07-04 MED ORDER — DEXTROSE 50 % IV SOLN
INTRAVENOUS | Status: AC
  Administered 2017-07-04: 50 mL
  Filled 2017-07-04: qty 50

## 2017-07-04 MED ORDER — DEXTROSE-NACL 5-0.9 % IV SOLN
INTRAVENOUS | Status: DC
  Administered 2017-07-04: 09:00:00 via INTRAVENOUS

## 2017-07-04 NOTE — Progress Notes (Signed)
Hypoglycemic Event  CBG: 49  Treatment: D50 IV 50 mL  Symptoms: Sweaty and Shaky  Follow-up CBG: Time:1823 CBG Result:124  Possible Reasons for Event: Other: unknown  Comments/MD notified:Dr vega notified Metformin held Diabetes med's changed     Luci Bank

## 2017-07-04 NOTE — Progress Notes (Addendum)
PROGRESS NOTE    Elizabeth Thompson  WGY:[id number] DOB: [birth year] DOA: 07/02/2017 PCP: Everardo Beals, NP   Brief Narrative:  66 y/o patient on chronic O2, severe pulm HTN on lasix and sildenafil, DM2. Lethargic for several days. Seen by PCP - found sodium "low" and came to ED for evaluation   Assessment & Plan:   Principal Problem:   Acute hyponatremia: Suspecting SIADH -  Patient reportedly recently started on Lasix and she reports low sodium diet. - Lab workup reviewed: Hyponatremia, HypoOsmolarity, and Urine osmolarity above 100 but urine sodium < 10, given these findings and the fact that anytime patient gets fluids her serum sodium levels decrease I will treat this patient as if she had SIADH. We'll discontinue fluids, increase salt tablets administration, and continue fluid restriction.  My concern would be what is causing this SIADH. On review of problem list I do not see any carcinomas although patient is 64 and could be at risk for developing a new carcinoma this would warrant broad imaging scanning. Review of patient's medication in particular: Abilify, Aricept, lamotrigine reviewed for side effect profile and no report of SIADH or hyponatremia listed.  Remeron however has listed side effect of hyponatremia as such will discontinue.  AMS - most likely due to hypoglycemia - Administered D5 normal saline at 50 mL per hour but this induced mild worsening of her hyponatremia as such fluids were held. - We'll obtain CBGs every before meals daily at bedtime - Patient may require appetite stimulant considering Marinol although this does have a tendency to make some progress confused or loopy  Active Problems:   Hypertension - Currently not on antihypertensive medications since her blood pressure was dropping with the initiation of Lasix and sildenafil during previous admission    Chronic respiratory failure with hypoxia, on home O2 therapy ([place]) - Patient has pulmonary  hypertension is on 3 L supplemental oxygen. We'll continue sildenafil    COPD (chronic obstructive pulmonary disease) (HCC) - no wheezes - continue duonebs, continue Dulera    Diabetes mellitus with complication ([place]) -  Diabetic diet Addendum- Patient is having hypoglycemic agents as such oral hypoglycemic agents will be discontinued glimepiride and metformin. Most likely mediated by poor oral intake. Patient of note should not be on metformin anyhow secondary to elevated serum creatinine above 1.4    Pulmonary HTN (HCC) - Lasix currently on hold, patient is on sildenafil    Bipolar disorder ([place]) - Continue home medication regimen.    Hyperlipidemia - Stable on statin    Acute metabolic encephalopathy - Resolving with improvement in serum creatinine    CKD (chronic kidney disease) stage 3, GFR 30-59 ml/min - Continue to monitor serum creatinine   DVT prophylaxis: Lovenox Code Status: Full Family Communication: discussed with patient Disposition Plan: pending improvement in sodium levels.   Consultants:   None   Procedures: None   Antimicrobials: None   Subjective: Pt was somnolent this am. Had low blood sugars  Objective: Vitals:   07/03/17 2056 07/04/17 0446 07/04/17 0540 07/04/17 0823  BP: 112/70  103/75 119/69  Pulse: 89  81 80  Resp: 16  20   Temp: 98.7 F (37.1 C)  98.5 F (36.9 C) 97.9 F (36.6 C)  TempSrc: Oral  Oral Oral  SpO2: 98%  100% 100%  Weight:  93.4 kg (206 lb)    Height:        Intake/Output Summary (Last 24 hours) at 07/04/17 1544 Last data filed at 07/04/17 0105  Gross per 24 hour  Intake           383.33 ml  Output              300 ml  Net            83.33 ml   Filed Weights   07/02/17 1908 07/03/17 0555 07/04/17 0446  Weight: 94.8 kg (209 lb) 92.7 kg (204 lb 6.4 oz) 93.4 kg (206 lb)    Examination:  General exam: in nad, alert and awake Respiratory system: no increased wob, no wheezes, equal chest  rise. Cardiovascular system: S1 & S2 heard, RRR. No JVD, murmurs, rubs, or gallops Gastrointestinal system: Abdomen is nondistended, soft and nontender. Normal bowel sounds heard. Central nervous system: Alert and oriented. Somnolent but arrousable, no facial asymmetry Extremities: Symmetric movement, warm Skin: No rashes, lesions or ulcers, on limited exam. Psychiatry:  Difficult to assess secondary to somnolence. Patient was arousable but had limited ability to assess this  Data Reviewed: I have personally reviewed following labs and imaging studies  CBC:  Recent Labs Lab 07/02/17 1431 07/03/17 0247  WBC 4.7 4.9  NEUTROABS 2.7  --   HGB 11.1* 10.3*  HCT 34.1* 32.3*  MCV 75.6* 75.6*  PLT 382 270   Basic Metabolic Panel:  Recent Labs Lab 07/02/17 1830 07/03/17 0247 07/03/17 1617 07/03/17 2127 07/04/17 0320 07/04/17 1026  NA  --  122* 119* 120* 122* 121*  K  --  4.0 3.8 4.3 4.0 4.0  CL  --  91* 87* 89* 92* 92*  CO2  --  22 21* 22 22 18*  GLUCOSE  --  55* 56* 85 60* 132*  BUN  --  23* 24* 24* 22* 22*  CREATININE  --  2.05* 2.32* 2.21* 1.95* 1.96*  CALCIUM  --  9.5 9.5 9.3 9.3 9.2  MG 1.8  --   --   --   --   --   PHOS 4.8*  --   --   --   --   --    GFR: Estimated Creatinine Clearance: 30.7 mL/min (A) (by C-G formula based on SCr of 1.96 mg/dL (H)). Liver Function Tests:  Recent Labs Lab 07/02/17 1431 07/03/17 0247  AST 33 31  ALT 18 16  ALKPHOS 63 56  BILITOT 0.4 0.4  PROT 6.9 6.3*  ALBUMIN 3.8 3.4*   No results for input(s): LIPASE, AMYLASE in the last 168 hours. No results for input(s): AMMONIA in the last 168 hours. Coagulation Profile: No results for input(s): INR, PROTIME in the last 168 hours. Cardiac Enzymes: No results for input(s): CKTOTAL, CKMB, CKMBINDEX, TROPONINI in the last 168 hours. BNP (last 3 results)  Recent Labs  05/04/17 1634  PROBNP 160.0*   HbA1C: No results for input(s): HGBA1C in the last 72 hours. CBG:  Recent  Labs Lab 07/04/17 0710 07/04/17 0813 07/04/17 1212  GLUCAP 52* 161* 87   Lipid Profile: No results for input(s): CHOL, HDL, LDLCALC, TRIG, CHOLHDL, LDLDIRECT in the last 72 hours. Thyroid Function Tests: No results for input(s): TSH, T4TOTAL, FREET4, T3FREE, THYROIDAB in the last 72 hours. Anemia Panel: No results for input(s): VITAMINB12, FOLATE, FERRITIN, TIBC, IRON, RETICCTPCT in the last 72 hours. Sepsis Labs:  Recent Labs Lab 07/02/17 1438 07/02/17 1837  LATICACIDVEN 1.16 0.69    No results found for this or any previous visit (from the past 240 hour(s)).   Radiology Studies: No results found.  Scheduled Meds: . ARIPiprazole  20  mg Oral QHS  . aspirin EC  81 mg Oral Daily  . atorvastatin  20 mg Oral QPC supper  . donepezil  5 mg Oral QHS  . dorzolamide-timolol  1 drop Both Eyes BID  . enoxaparin (LOVENOX) injection  30 mg Subcutaneous Q24H  . glimepiride  2 mg Oral Q breakfast  . hydrOXYzine  50 mg Oral QHS  . lamoTRIgine  100 mg Oral QPC supper  . latanoprost  1 drop Both Eyes QHS  . magnesium oxide  200 mg Oral Daily  . metFORMIN  1,000 mg Oral BID WC  . mirtazapine  7.5 mg Oral QHS  . mometasone-formoterol  2 puff Inhalation BID  . sildenafil  20 mg Oral TID  . sodium chloride flush  3 mL Intravenous Q12H  . sodium chloride  2 g Oral TID WC   Continuous Infusions:   LOS: 2 days    Time spent: > 35 minutes  Velvet Bathe, MD [facility] Pager [phone]  If 7PM-7AM, please contact night-coverage [email or web address] Password [facility] 07/04/2017, 3:44 PM

## 2017-07-04 NOTE — Progress Notes (Signed)
Hypoglycemic Event  CBG: 52  Treatment: D50 IV 50 mL  Symptoms: Shaky  Follow-up CBG: Time: to be taken CBG Result: to be taken  Possible Reasons for Event: Inadequate meal intake  Comments/MD notified:  On report and reviewing labs with day shift RN  Blood glucose noted to be low at 0320, no lab call to RN. CBG checked at this time, result above. Pt given amp of D50. Will recheck CBG and continue to monitor. [place], Tobie Lords

## 2017-07-04 NOTE — Progress Notes (Signed)
RT called RN stating that pt appeared more lethargic than the previous morning Rn went to assess pt. Pt appeared lethargic and pt was A&Ox4 VSS and blood sugar went up to 161. Rapid called to assess pt along with Dr. Wendee Beavers, orders placed  By MD and Rapid Nurse will continue to monitor pt.

## 2017-07-04 NOTE — Significant Event (Signed)
Rapid Response Event Note  Overview:  Called by Bennie Dallas for Rapid response  Time Called: [phone] Arrival Time: 0820 Event Type: Other (Comment)  Initial Focused Assessment:  Called by Raquel Sarna, Rt for patient with increased lethargy.  On my arrival, RN, Rt and visitor at bedside.  Patient is lethargic, but answers questions appropriately and moves all extremities, skin warm and dry.  patient states she is sleepy.  RN states patient had low CBG in the 50's was given an amp of d50 and cbg now 160.     Interventions:  ABG done per protocol.  No focal deficits, lethargy probably realted to low CBG.  MD paged and updated, and at bedside  Plan of Care (if not transferred):   RN to monitor patient and call if assistance needed  Event Summary:   at      at          [facility]

## 2017-07-05 LAB — BASIC METABOLIC PANEL
ANION GAP: 9 (ref 5–15)
ANION GAP: 8 (ref 5–15)
ANION GAP: 7 (ref 5–15)
BUN: 23 mg/dL — ABNORMAL HIGH (ref 6–20)
BUN: 21 mg/dL — ABNORMAL HIGH (ref 6–20)
BUN: 23 mg/dL — ABNORMAL HIGH (ref 6–20)
CHLORIDE: 95 mmol/L — AB (ref 101–111)
CHLORIDE: 97 mmol/L — AB (ref 101–111)
CHLORIDE: 95 mmol/L — AB (ref 101–111)
CO2: 21 mmol/L — ABNORMAL LOW (ref 22–32)
CO2: 22 mmol/L (ref 22–32)
CO2: 24 mmol/L (ref 22–32)
Calcium: 9.6 mg/dL (ref 8.9–10.3)
Calcium: 9.4 mg/dL (ref 8.9–10.3)
Calcium: 9.4 mg/dL (ref 8.9–10.3)
Creatinine, Ser: 1.72 mg/dL — ABNORMAL HIGH (ref 0.44–1.00)
Creatinine, Ser: 1.79 mg/dL — ABNORMAL HIGH (ref 0.44–1.00)
Creatinine, Ser: 1.86 mg/dL — ABNORMAL HIGH (ref 0.44–1.00)
GFR calc Af Amer: 35 mL/min — ABNORMAL LOW (ref >60)
GFR calc Af Amer: 33 mL/min — ABNORMAL LOW (ref >60)
GFR calc Af Amer: 31 mL/min — ABNORMAL LOW (ref >60)
GFR, EST NON AFRICAN AMERICAN: 30 mL/min — AB (ref >60)
GFR, EST NON AFRICAN AMERICAN: 28 mL/min — AB (ref >60)
GFR, EST NON AFRICAN AMERICAN: 27 mL/min — AB (ref >60)
GLUCOSE: 82 mg/dL (ref 65–99)
Glucose, Bld: 147 mg/dL — ABNORMAL HIGH (ref 65–99)
Glucose, Bld: 216 mg/dL — ABNORMAL HIGH (ref 65–99)
POTASSIUM: 4.6 mmol/L (ref 3.5–5.1)
POTASSIUM: 4.3 mmol/L (ref 3.5–5.1)
POTASSIUM: 4.7 mmol/L (ref 3.5–5.1)
SODIUM: 125 mmol/L — AB (ref 135–145)
SODIUM: 127 mmol/L — AB (ref 135–145)
SODIUM: 126 mmol/L — AB (ref 135–145)

## 2017-07-05 LAB — GLUCOSE, CAPILLARY
GLUCOSE-CAPILLARY: 71 mg/dL (ref 65–99)
GLUCOSE-CAPILLARY: 80 mg/dL (ref 65–99)
GLUCOSE-CAPILLARY: 158 mg/dL — AB (ref 65–99)
GLUCOSE-CAPILLARY: 175 mg/dL — AB (ref 65–99)
GLUCOSE-CAPILLARY: 176 mg/dL — AB (ref 65–99)
Glucose-Capillary: 99 mg/dL (ref 65–99)

## 2017-07-05 MED ORDER — INSULIN ASPART 100 UNIT/ML ~~LOC~~ SOLN
0.0000 [IU] | Freq: Three times a day (TID) | SUBCUTANEOUS | Status: DC
  Administered 2017-07-06: 2 [IU] via SUBCUTANEOUS
  Administered 2017-07-07 – 2017-07-08 (×3): 1 [IU] via SUBCUTANEOUS
  Administered 2017-07-08 – 2017-07-09 (×2): 2 [IU] via SUBCUTANEOUS
  Administered 2017-07-09: 3 [IU] via SUBCUTANEOUS
  Administered 2017-07-10: 5 [IU] via SUBCUTANEOUS
  Administered 2017-07-10: 2 [IU] via SUBCUTANEOUS
  Administered 2017-07-11: 1 [IU] via SUBCUTANEOUS
  Administered 2017-07-11: 2 [IU] via SUBCUTANEOUS
  Administered 2017-07-11: 5 [IU] via SUBCUTANEOUS
  Administered 2017-07-12: 1 [IU] via SUBCUTANEOUS
  Administered 2017-07-12 – 2017-07-13 (×2): 2 [IU] via SUBCUTANEOUS

## 2017-07-05 MED ORDER — ENOXAPARIN SODIUM 40 MG/0.4ML ~~LOC~~ SOLN
40.0000 mg | SUBCUTANEOUS | Status: DC
  Administered 2017-07-05 – 2017-07-12 (×8): 40 mg via SUBCUTANEOUS
  Filled 2017-07-05 (×8): qty 0.4

## 2017-07-05 MED ORDER — METOCLOPRAMIDE HCL 5 MG/ML IJ SOLN
5.0000 mg | Freq: Once | INTRAMUSCULAR | Status: AC | PRN
  Administered 2017-07-05: 5 mg via INTRAVENOUS
  Filled 2017-07-05: qty 2

## 2017-07-05 NOTE — Care Management Note (Addendum)
Case Management Note  Patient Details  Name: Elizabeth Thompson MRN: [id number] Date of Birth: Apr 20, 1951  Subjective/Objective:         Admitted with hyponatremia,  hx of chronic O2, severe pulm HTN/ lasix and sildenafil, DM2.  6/27-06/14/2017 -  admitted for acute on chronic respiratory failure, ? 2/2 acute diastolic heart failure, underwent cardiac catheterization on 6/29 showing severe pulmonary hypertension.    [name] (Niece)      [phone]      PCP: Tyrell Antonio   Action/Plan:  Per PT: Home health PT Discharge planning in process.... CM following for disposition needs.  Expected Discharge Date:                  Expected Discharge Plan:  [place]  In-House Referral:     Discharge planning Services  CM Consult  Post Acute Care Choice:  Resumption of Svcs/PTA Provider Choice offered to:  Patient  DME Arranged:    DME Agency:  [facility] (home oxygen,3L)  HH Arranged:   [place] Agency:   [facility]  Status of Service:  In process, will continue to follow  If discussed at Long Length of Stay Meetings, dates discussed:    Additional Comments:  Sharin Mons, RN 07/05/2017, 8:31 PM

## 2017-07-05 NOTE — Progress Notes (Addendum)
PROGRESS NOTE    Elizabeth Thompson  XYB:[id number] DOB: 08/15/1951 DOA: 07/02/2017 PCP: Everardo Beals, NP   Brief Narrative:  66 y/o patient on chronic O2, severe pulm HTN on lasix and sildenafil, DM2. Lethargic for several days. Seen by PCP - found sodium "low" and came to ED for evaluation  Assessment & Plan:   Principal Problem:   Acute hyponatremia: Suspecting SIADH - Suspecting SIADH because of lab work and because of decrease in sodium levels with fluid administration.   Remeron discontinued because of reported side effect of hyponatremia.  AMS - resolving with improvement in sodium levels and resolution of hypoglycemia  Active Problems:   Hypertension - Currently not on antihypertensive medications since her blood pressure was dropping with the initiation of Lasix and sildenafil during previous admission    Chronic respiratory failure with hypoxia, on home O2 therapy ([place]) - Patient has pulmonary hypertension is on 3 L supplemental oxygen. - stable on sildenafil    COPD (chronic obstructive pulmonary disease) (HCC) - no wheezes - continue duonebs, continue Dulera    Diabetes mellitus with complication ([place]) -  Diabetic diet Discontinued oral     Pulmonary HTN ([place]) - Lasix currently on hold, patient is on sildenafil    Bipolar disorder ([place]) - Continue home medication regimen.    Hyperlipidemia - Stable on statin    Acute metabolic encephalopathy - Resolving with improvement in serum creatinine    CKD (chronic kidney disease) stage 3, GFR 30-59 ml/min - Continue to monitor serum creatinine   DVT prophylaxis: Lovenox Code Status: Full Family Communication: discussed with patient and niece Disposition Plan: pending improvement in sodium levels.   Consultants:   None   Procedures: None   Antimicrobials: None   Subjective: Pt has no new complaints. No acute issues overnight. Is becoming more alert.  Objective: Vitals:   07/05/17 0500  07/05/17 0533 07/05/17 0921 07/05/17 1000  BP:  (!) 108/46  127/66  Pulse:  85  87  Resp:  16    Temp:  97.9 F (36.6 C)  98 F (36.7 C)  TempSrc:  Oral  Axillary  SpO2:   98% 97%  Weight: 94.3 kg (207 lb 14.4 oz)     Height:        Intake/Output Summary (Last 24 hours) at 07/05/17 1342 Last data filed at 07/05/17 0755  Gross per 24 hour  Intake              240 ml  Output              201 ml  Net               39 ml   Filed Weights   07/03/17 0555 07/04/17 0446 07/05/17 0500  Weight: 92.7 kg (204 lb 6.4 oz) 93.4 kg (206 lb) 94.3 kg (207 lb 14.4 oz)    Examination:  General exam: awake and alert, in nad. Respiratory system:  Equal chest rise, no increased work of breathing, nasal cannula in place, no wheezes Cardiovascular system: S1 & S2 heard, RRR. No JVD, murmurs, rubs, or gallops Gastrointestinal system: Abdomen is nondistended, soft and nontender. Normal bowel sounds heard. No guarding Central nervous system: Alert and oriented. No facial asymmetry Extremities: Symmetric movement, warm Skin: No rashes, lesions or ulcers, on limited exam. Psychiatry:  Addendum: more alert, mood appropriate.  Data Reviewed: I have personally reviewed following labs and imaging studies  CBC:  Recent Labs Lab 07/02/17 1431 07/03/17 0247  WBC 4.7 4.9  NEUTROABS 2.7  --   HGB 11.1* 10.3*  HCT 34.1* 32.3*  MCV 75.6* 75.6*  PLT 382 335   Basic Metabolic Panel:  Recent Labs Lab 07/02/17 1830  07/03/17 2127 07/04/17 0320 07/04/17 1026 07/04/17 1527 07/05/17 0841  NA  --   < > 120* 122* 121* 124* 125*  K  --   < > 4.3 4.0 4.0 4.2 4.6  CL  --   < > 89* 92* 92* 93* 95*  CO2  --   < > 22 22 18* 20* 21*  GLUCOSE  --   < > 85 60* 132* 92 82  BUN  --   < > 24* 22* 22* 22* 23*  CREATININE  --   < > 2.21* 1.95* 1.96* 2.12* 1.72*  CALCIUM  --   < > 9.3 9.3 9.2 9.3 9.6  MG 1.8  --   --   --   --   --   --   PHOS 4.8*  --   --   --   --   --   --   < > = values in this interval  not displayed. GFR: Estimated Creatinine Clearance: 35.1 mL/min (A) (by C-G formula based on SCr of 1.72 mg/dL (H)). Liver Function Tests:  Recent Labs Lab 07/02/17 1431 07/03/17 0247  AST 33 31  ALT 18 16  ALKPHOS 63 56  BILITOT 0.4 0.4  PROT 6.9 6.3*  ALBUMIN 3.8 3.4*   No results for input(s): LIPASE, AMYLASE in the last 168 hours. No results for input(s): AMMONIA in the last 168 hours. Coagulation Profile: No results for input(s): INR, PROTIME in the last 168 hours. Cardiac Enzymes: No results for input(s): CKTOTAL, CKMB, CKMBINDEX, TROPONINI in the last 168 hours. BNP (last 3 results)  Recent Labs  05/04/17 1634  PROBNP 160.0*   HbA1C: No results for input(s): HGBA1C in the last 72 hours. CBG:  Recent Labs Lab 07/04/17 1937 07/05/17 0107 07/05/17 0431 07/05/17 0837 07/05/17 1309  GLUCAP 92 99 71 80 158*   Lipid Profile: No results for input(s): CHOL, HDL, LDLCALC, TRIG, CHOLHDL, LDLDIRECT in the last 72 hours. Thyroid Function Tests: No results for input(s): TSH, T4TOTAL, FREET4, T3FREE, THYROIDAB in the last 72 hours. Anemia Panel: No results for input(s): VITAMINB12, FOLATE, FERRITIN, TIBC, IRON, RETICCTPCT in the last 72 hours. Sepsis Labs:  Recent Labs Lab 07/02/17 1438 07/02/17 1837  LATICACIDVEN 1.16 0.69    No results found for this or any previous visit (from the past 240 hour(s)).   Radiology Studies: No results found.  Scheduled Meds: . ARIPiprazole  20 mg Oral QHS  . aspirin EC  81 mg Oral Daily  . atorvastatin  20 mg Oral QPC supper  . donepezil  5 mg Oral QHS  . dorzolamide-timolol  1 drop Both Eyes BID  . enoxaparin (LOVENOX) injection  30 mg Subcutaneous Q24H  . feeding supplement (ENSURE ENLIVE)  237 mL Oral BID BM  . hydrOXYzine  50 mg Oral QHS  . lamoTRIgine  100 mg Oral QPC supper  . latanoprost  1 drop Both Eyes QHS  . magnesium oxide  200 mg Oral Daily  . mometasone-formoterol  2 puff Inhalation BID  . sildenafil  20  mg Oral TID  . sodium chloride flush  3 mL Intravenous Q12H  . sodium chloride  2 g Oral TID WC   Continuous Infusions:   LOS: 3 days    Time spent: >  18 minutes  Velvet Bathe, MD [facility] Pager [phone]  If 7PM-7AM, please contact night-coverage [email or web address] Password [email or web address] 07/05/2017, 1:42 PM

## 2017-07-05 NOTE — Evaluation (Addendum)
Physical Therapy Evaluation Patient Details Name: Elizabeth Thompson MRN: [id number] DOB: 01/15/1951 Today's Date: 07/05/2017   History of Present Illness  pt is a 66 y/o female with phm significant for COPD on 3L O2, recent dx of sever pulmanary HTN, DM, bipolar d/o, admitted with lethargy for several days.  Found to have acute hyponatremia.  Work up continues.  Clinical Impression  Pt admitted with/for lethargy due in part to hyponatremia.  Pt is needing at least minimal assist for most back mobility.  Pt currently limited functionally due to the problems listed below.  (see problems list.)  Pt will benefit from PT to maximize function and safety to be able to get home safely with available assist.     Follow Up Recommendations Home health PT;DC plan and follow up therapy as arranged by surgeon;Other (comment) (work up to going to [employer])    Equipment Recommendations  None recommended by PT    Recommendations for Other Services       Precautions / Restrictions Precautions Precautions: Fall      Mobility  Bed Mobility Overal bed mobility: Needs Assistance Bed Mobility: Supine to Sit     Supine to sit: Min assist        Transfers Overall transfer level: Needs assistance   Transfers: Sit to/from Stand Sit to Stand: Min assist         General transfer comment: cues for hand placement and assist to help come more forward.  Ambulation/Gait Ambulation/Gait assistance: Min assist Ambulation Distance (Feet): 80 Feet (x2 with standing rest at half way point)   Gait Pattern/deviations: Step-through pattern Gait velocity: slower Gait velocity interpretation: Below normal speed for age/gender General Gait Details: generally guarded, steady steps with RW.  sats dropped to 83 % on 3L at 107 bpm.  On 4L still dropped into 80's with quick recovery into the 90's%  Stairs            Wheelchair Mobility    Modified Rankin (Stroke Patients Only)       Balance Overall  balance assessment: Needs assistance Sitting-balance support: No upper extremity supported Sitting balance-Leahy Scale: Fair       Standing balance-Leahy Scale: Fair                               Pertinent Vitals/Pain Pain Assessment: No/denies pain    Home Living Family/patient expects to be discharged to:: Private residence Living Arrangements: Other relatives (niece) Available Help at Discharge: Family;Available 24 hours/day;Available PRN/intermittently Type of Home: House Home Access: Stairs to enter Entrance Stairs-Rails: None Entrance Stairs-Number of Steps: 1 Home Layout: One level Home Equipment: Shower seat;Wheelchair - [employer] - 2 wheels;Walker - 4 wheels Additional Comments: lives with niece who is CG, on home O2    Prior Function Level of Independence: Independent with assistive device(s)         Comments: has been having to have a little more assist since last hospitalization     Hand Dominance   Dominant Hand: Right    Extremity/Trunk Assessment   Upper Extremity Assessment Upper Extremity Assessment: Overall WFL for tasks assessed;Generalized weakness    Lower Extremity Assessment Lower Extremity Assessment: Overall WFL for tasks assessed (general weakness in hip flexors, df/pf, otherise 4/5)       Communication   Communication: No difficulties  Cognition Arousal/Alertness: Awake/alert;Lethargic Behavior During Therapy: WFL for tasks assessed/performed Overall Cognitive Status: Within Functional Limits for tasks assessed  General Comments      Exercises     Assessment/Plan    PT Assessment Patient needs continued PT services  PT Problem List Decreased strength;Decreased activity tolerance;Decreased balance;Decreased mobility;Decreased coordination       PT Treatment Interventions DME instruction;Gait training;Functional mobility training;Therapeutic  activities;Balance training;Patient/family education;Stair training    PT Goals (Current goals can be found in the Care Plan section)  Acute Rehab PT Goals Patient Stated Goal: I want to go home PT Goal Formulation: With patient Time For Goal Achievement: 07/19/17 Potential to Achieve Goals: Good    Frequency Min 3X/week   Barriers to discharge        Co-evaluation               AM-PAC PT "6 Clicks" Daily Activity  Outcome Measure Difficulty turning over in bed (including adjusting bedclothes, sheets and blankets)?: A Little Difficulty moving from lying on back to sitting on the side of the bed? : A Little Difficulty sitting down on and standing up from a chair with arms (e.g., wheelchair, bedside commode, etc,.)?: A Little Help needed moving to and from a bed to chair (including a wheelchair)?: A Little Help needed walking in hospital room?: A Little Help needed climbing 3-5 steps with a railing? : A Lot 6 Click Score: 17    End of Session   Activity Tolerance: Patient tolerated treatment well Patient left: in chair;with call bell/phone within reach;with chair alarm set;with family/visitor present Nurse Communication: Mobility status PT Visit Diagnosis: Unsteadiness on feet (R26.81);Muscle weakness (generalized) (M62.81)    Time: 8453-6468 PT Time Calculation (min) (ACUTE ONLY): 40 min   Charges:   PT Evaluation $PT Eval Moderate Complexity: 1 Procedure PT Treatments $Gait Training: 8-22 mins $Therapeutic Activity: 8-22 mins   PT G Codes:        Jul 21, 2017  [name], PT [phone]  (pager)  [name] Briar Sword 07/21/17, 4:40 PM

## 2017-07-05 NOTE — Care Management Important Message (Signed)
Important Message  Patient Details  Name: Elizabeth Thompson MRN: [id number] Date of Birth: [birth year]   Medicare Important Message Given:  Yes    Nathen May 07/05/2017, 9:34 AM

## 2017-07-06 LAB — BASIC METABOLIC PANEL
Anion gap: 7 (ref 5–15)
Anion gap: 9 (ref 5–15)
BUN: 23 mg/dL — AB (ref 6–20)
BUN: 21 mg/dL — AB (ref 6–20)
CHLORIDE: 97 mmol/L — AB (ref 101–111)
CO2: 21 mmol/L — AB (ref 22–32)
CO2: 23 mmol/L (ref 22–32)
Calcium: 9.3 mg/dL (ref 8.9–10.3)
Calcium: 9.4 mg/dL (ref 8.9–10.3)
Chloride: 96 mmol/L — ABNORMAL LOW (ref 101–111)
Creatinine, Ser: 1.54 mg/dL — ABNORMAL HIGH (ref 0.44–1.00)
Creatinine, Ser: 1.56 mg/dL — ABNORMAL HIGH (ref 0.44–1.00)
GFR calc Af Amer: 39 mL/min — ABNORMAL LOW (ref >60)
GFR calc Af Amer: 39 mL/min — ABNORMAL LOW (ref >60)
GFR, EST NON AFRICAN AMERICAN: 34 mL/min — AB (ref >60)
GFR, EST NON AFRICAN AMERICAN: 34 mL/min — AB (ref >60)
GLUCOSE: 127 mg/dL — AB (ref 65–99)
GLUCOSE: 114 mg/dL — AB (ref 65–99)
POTASSIUM: 4.3 mmol/L (ref 3.5–5.1)
POTASSIUM: 4.4 mmol/L (ref 3.5–5.1)
Sodium: 124 mmol/L — ABNORMAL LOW (ref 135–145)
Sodium: 129 mmol/L — ABNORMAL LOW (ref 135–145)

## 2017-07-06 LAB — GLUCOSE, CAPILLARY
GLUCOSE-CAPILLARY: 118 mg/dL — AB (ref 65–99)
GLUCOSE-CAPILLARY: 157 mg/dL — AB (ref 65–99)
GLUCOSE-CAPILLARY: 105 mg/dL — AB (ref 65–99)
GLUCOSE-CAPILLARY: 105 mg/dL — AB (ref 65–99)
Glucose-Capillary: 175 mg/dL — ABNORMAL HIGH (ref 65–99)
Glucose-Capillary: 134 mg/dL — ABNORMAL HIGH (ref 65–99)

## 2017-07-06 NOTE — Progress Notes (Signed)
Physical Therapy Treatment Patient Details Name: Elizabeth Thompson MRN: [id number] DOB: Sep 24, 1951 Today's Date: 07/06/2017    History of Present Illness pt is a 66 y/o female with phm significant for COPD on 3L O2, recent dx of sever pulmanary HTN, DM, bipolar d/o, admitted with lethargy for several days.  Found to have acute hyponatremia.  Work up continues.    PT Comments    [name] pulmonary progress is slow, but overall functional mobility has improved and pt's niece can entertain thoughts of getting her home now.  [name] wants to start with HHPT then transition to out of the house at [facility] or more ideally, Pulmonary rehab or CRP[facility].   Follow Up Recommendations  Home health PT;Other (comment) (with transition to [facility] or pulmonary rehab)     Equipment Recommendations  None recommended by PT    Recommendations for Other Services       Precautions / Restrictions Precautions Precautions: Fall    Mobility  Bed Mobility Overal bed mobility: Needs Assistance             General bed mobility comments: pt already up on arrival  Transfers   Equipment used: Rolling walker (2 wheeled) Transfers: Sit to/from Stand Sit to Stand: Min guard;Min assist         General transfer comment: cues for hand placement and assist to help come more forward or control descent when fatigued  Ambulation/Gait Ambulation/Gait assistance: Min guard;Min assist Ambulation Distance (Feet): 100 Feet (x2) Assistive device: Rolling walker (2 wheeled) Gait Pattern/deviations: Step-through pattern Gait velocity: slower Gait velocity interpretation: Below normal speed for age/gender General Gait Details: generally guarded, intially steady, but with fatigue or drop in sats, pt becomes mildly unsteady.  Pt inevitably begins to wheeze and gets more lethargic, not standing as well.  At this point on 4L pt;s sats are as low as 75-79% with EHR in the 100's bpm  It took pt 1 .5-2 mins to recover to  90%   Stairs            Wheelchair Mobility    Modified Rankin (Stroke Patients Only)       Balance Overall balance assessment: Needs assistance Sitting-balance support: No upper extremity supported Sitting balance-Leahy Scale: Fair     Standing balance support: Single extremity supported;Bilateral upper extremity supported Standing balance-Leahy Scale: Fair Standing balance comment: With fatigue, pt starts bobbing and needs UE's plus external support for stability.                            Cognition Arousal/Alertness: Awake/alert;Lethargic Behavior During Therapy: WFL for tasks assessed/performed Overall Cognitive Status: Within Functional Limits for tasks assessed                                        Exercises General Exercises - Lower Extremity Hip Flexion/Marching: AROM;Strengthening;Both;10 reps;Standing    General Comments        Pertinent Vitals/Pain Pain Assessment: No/denies pain    Home Living                      Prior Function            PT Goals (current goals can now be found in the care plan section) Acute Rehab PT Goals Patient Stated Goal: I want to go home PT Goal Formulation: With  patient Time For Goal Achievement: 07/19/17 Potential to Achieve Goals: Good Progress towards PT goals: Progressing toward goals    Frequency    Min 3X/week      PT Plan Current plan remains appropriate    Co-evaluation              AM-PAC PT "6 Clicks" Daily Activity  Outcome Measure  Difficulty turning over in bed (including adjusting bedclothes, sheets and blankets)?: A Little Difficulty moving from lying on back to sitting on the side of the bed? : A Lot Difficulty sitting down on and standing up from a chair with arms (e.g., wheelchair, bedside commode, etc,.)?: A Little Help needed moving to and from a bed to chair (including a wheelchair)?: A Little Help needed walking in hospital room?: A  Little Help needed climbing 3-5 steps with a railing? : A Little 6 Click Score: 17    End of Session   Activity Tolerance: Patient tolerated treatment well Patient left: in chair;with call bell/phone within reach;with chair alarm set;with family/visitor present Nurse Communication: Mobility status PT Visit Diagnosis: Unsteadiness on feet (R26.81);Muscle weakness (generalized) (M62.81);Other abnormalities of gait and mobility (R26.89)     Time: 9678-9381 PT Time Calculation (min) (ACUTE ONLY): 41 min  Charges:  $Gait Training: 8-22 mins $Therapeutic Activity: 23-37 mins                    G Codes:       07/17/17  [name], PT [phone]  (pager)   Elizabeth Thompson Jul 17, 2017, 3:41 PM

## 2017-07-06 NOTE — Progress Notes (Signed)
PROGRESS NOTE    Elizabeth Thompson  IDP:[id number] DOB: 1951/12/11 DOA: 07/02/2017 PCP: Everardo Beals, NP   Brief Narrative:  66 y/o patient on chronic O2, severe pulm HTN on lasix and sildenafil, DM2. Lethargic for several days. Seen by PCP - found sodium "low" and came to ED for evaluation  Assessment & Plan:   Principal Problem:   Acute hyponatremia: Suspecting SIADH - Suspecting SIADH because of lab work and because of decrease in sodium levels with fluid administration.   Remeron discontinued because of reported side effect of hyponatremia. - Resolving on current medical management. Will continue sodium chloride tablet and fluid restriction  AMS - resolved after improving sodium levels and resolution of hypoglycemia  Active Problems:   Hypertension - Pt off antihypertensive medications since her blood pressure was dropping with the initiation of Lasix and sildenafil during previous admission    Chronic respiratory failure with hypoxia, on home O2 therapy ([place]) - Patient has pulmonary hypertension is on 3 L supplemental oxygen. - stable on sildenafil    COPD (chronic obstructive pulmonary disease) (HCC) - stable - continue duonebs, continue Dulera    Diabetes mellitus with complication ([place]) -  Diabetic diet Discontinued oral hypoglycemic agents - covering with SSI    Pulmonary HTN ([place]) - Lasix currently on hold, patient is on sildenafil    Bipolar disorder ([place]) - Continue home medication regimen.    Hyperlipidemia - Stable on statin    Acute metabolic encephalopathy - Resolving with improvement in serum creatinine    CKD (chronic kidney disease) stage 3, GFR 30-59 ml/min - Continue to monitor serum creatinine   DVT prophylaxis: Lovenox Code Status: Full Family Communication: discussed with patient and niece Disposition Plan: started discharge planning. D/c next am with home health PT   Consultants:   None   Procedures:  None   Antimicrobials: None   Subjective: Pt has no new complaints. No acute issues overnight. Is becoming more alert.  Objective: Vitals:   07/05/17 1400 07/05/17 1954 07/05/17 2136 07/06/17 0601  BP: (!) 107/48  (!) 111/51 (!) 114/55  Pulse: 85  88 85  Resp:   18 16  Temp: 97.6 F (36.4 C)  (!) 97.5 F (36.4 C) 97.8 F (36.6 C)  TempSrc: Oral  Oral Oral  SpO2: 100% 100% 99% 100%  Weight:    93.7 kg (206 lb 8 oz)  Height:        Intake/Output Summary (Last 24 hours) at 07/06/17 1354 Last data filed at 07/06/17 4431  Gross per 24 hour  Intake              716 ml  Output                0 ml  Net              716 ml   Filed Weights   07/04/17 0446 07/05/17 0500 07/06/17 0601  Weight: 93.4 kg (206 lb) 94.3 kg (207 lb 14.4 oz) 93.7 kg (206 lb 8 oz)    Examination:  General exam: Pt in nad, alert and awake Respiratory system:  Equal chest rise, no wheezes, no increased work of breathing, nasal cannula in place,  Cardiovascular system: S1 & S2 heard, RRR. No JVD, murmurs, rubs, or gallops Gastrointestinal system: Abdomen is nondistended, soft and nontender. Normal bowel sounds heard. No guarding Central nervous system: Alert and oriented. Moves extremities equally Extremities: Symmetric movement, warm Skin: No rashes, lesions or ulcers, on limited exam.  Psychiatry: Mood and affect appropriate.  Data Reviewed: I have personally reviewed following labs and imaging studies  CBC:  Recent Labs Lab 07/02/17 1431 07/03/17 0247  WBC 4.7 4.9  NEUTROABS 2.7  --   HGB 11.1* 10.3*  HCT 34.1* 32.3*  MCV 75.6* 75.6*  PLT 382 235   Basic Metabolic Panel:  Recent Labs Lab 07/02/17 1830  07/05/17 0841 07/05/17 1422 07/05/17 2015 07/06/17 0303 07/06/17 0758  NA  --   < > 125* 127* 126* 124* 129*  K  --   < > 4.6 4.3 4.7 4.3 4.4  CL  --   < > 95* 97* 95* 96* 97*  CO2  --   < > 21* 22 24 21* 23  GLUCOSE  --   < > 82 147* 216* 127* 114*  BUN  --   < > 23* 21* 23*  23* 21*  CREATININE  --   < > 1.72* 1.79* 1.86* 1.54* 1.56*  CALCIUM  --   < > 9.6 9.4 9.4 9.3 9.4  MG 1.8  --   --   --   --   --   --   PHOS 4.8*  --   --   --   --   --   --   < > = values in this interval not displayed. GFR: Estimated Creatinine Clearance: 38.6 mL/min (A) (by C-G formula based on SCr of 1.56 mg/dL (H)). Liver Function Tests:  Recent Labs Lab 07/02/17 1431 07/03/17 0247  AST 33 31  ALT 18 16  ALKPHOS 63 56  BILITOT 0.4 0.4  PROT 6.9 6.3*  ALBUMIN 3.8 3.4*   No results for input(s): LIPASE, AMYLASE in the last 168 hours. No results for input(s): AMMONIA in the last 168 hours. Coagulation Profile: No results for input(s): INR, PROTIME in the last 168 hours. Cardiac Enzymes: No results for input(s): CKTOTAL, CKMB, CKMBINDEX, TROPONINI in the last 168 hours. BNP (last 3 results)  Recent Labs  05/04/17 1634  PROBNP 160.0*   HbA1C: No results for input(s): HGBA1C in the last 72 hours. CBG:  Recent Labs Lab 07/05/17 2133 07/06/17 0139 07/06/17 0442 07/06/17 0808 07/06/17 1236  GLUCAP 176* 175* 134* 118* 157*   Lipid Profile: No results for input(s): CHOL, HDL, LDLCALC, TRIG, CHOLHDL, LDLDIRECT in the last 72 hours. Thyroid Function Tests: No results for input(s): TSH, T4TOTAL, FREET4, T3FREE, THYROIDAB in the last 72 hours. Anemia Panel: No results for input(s): VITAMINB12, FOLATE, FERRITIN, TIBC, IRON, RETICCTPCT in the last 72 hours. Sepsis Labs:  Recent Labs Lab 07/02/17 1438 07/02/17 1837  LATICACIDVEN 1.16 0.69    No results found for this or any previous visit (from the past 240 hour(s)).   Radiology Studies: No results found.  Scheduled Meds: . ARIPiprazole  20 mg Oral QHS  . aspirin EC  81 mg Oral Daily  . atorvastatin  20 mg Oral QPC supper  . donepezil  5 mg Oral QHS  . dorzolamide-timolol  1 drop Both Eyes BID  . enoxaparin (LOVENOX) injection  40 mg Subcutaneous Q24H  . feeding supplement (ENSURE ENLIVE)  237 mL Oral  BID BM  . hydrOXYzine  50 mg Oral QHS  . insulin aspart  0-9 Units Subcutaneous TID WC  . lamoTRIgine  100 mg Oral QPC supper  . latanoprost  1 drop Both Eyes QHS  . magnesium oxide  200 mg Oral Daily  . mometasone-formoterol  2 puff Inhalation BID  . sildenafil  20  mg Oral TID  . sodium chloride flush  3 mL Intravenous Q12H  . sodium chloride  2 g Oral TID WC   Continuous Infusions:   LOS: 4 days    Time spent: > 35 minutes  Velvet Bathe, MD [facility] Pager [phone]  If 7PM-7AM, please contact night-coverage [email or web address] Password [email or web address] 07/06/2017, 1:54 PM

## 2017-07-07 LAB — BASIC METABOLIC PANEL
Anion gap: 10 (ref 5–15)
BUN: 21 mg/dL — ABNORMAL HIGH (ref 6–20)
CALCIUM: 9.7 mg/dL (ref 8.9–10.3)
CHLORIDE: 101 mmol/L (ref 101–111)
CO2: 21 mmol/L — AB (ref 22–32)
CREATININE: 1.38 mg/dL — AB (ref 0.44–1.00)
GFR calc Af Amer: 45 mL/min — ABNORMAL LOW (ref >60)
GFR calc non Af Amer: 39 mL/min — ABNORMAL LOW (ref >60)
GLUCOSE: 110 mg/dL — AB (ref 65–99)
Potassium: 4.7 mmol/L (ref 3.5–5.1)
Sodium: 132 mmol/L — ABNORMAL LOW (ref 135–145)

## 2017-07-07 LAB — GLUCOSE, CAPILLARY
GLUCOSE-CAPILLARY: 109 mg/dL — AB (ref 65–99)
Glucose-Capillary: 124 mg/dL — ABNORMAL HIGH (ref 65–99)
Glucose-Capillary: 131 mg/dL — ABNORMAL HIGH (ref 65–99)
Glucose-Capillary: 149 mg/dL — ABNORMAL HIGH (ref 65–99)

## 2017-07-07 LAB — C-PEPTIDE: C PEPTIDE: 8.3 ng/mL — AB (ref 1.1–4.4)

## 2017-07-07 MED ORDER — CEPHALEXIN 500 MG PO CAPS
500.0000 mg | ORAL_CAPSULE | Freq: Two times a day (BID) | ORAL | Status: DC
  Administered 2017-07-07 – 2017-07-12 (×11): 500 mg via ORAL
  Filled 2017-07-07 (×11): qty 1

## 2017-07-07 MED ORDER — FUROSEMIDE 40 MG PO TABS: 40.0000 mg | ORAL_TABLET | Freq: Every day | ORAL | Status: DC

## 2017-07-07 MED ORDER — SODIUM CHLORIDE 1 G PO TABS
1.0000 g | ORAL_TABLET | Freq: Two times a day (BID) | ORAL | Status: DC
  Administered 2017-07-07 – 2017-07-09 (×4): 1 g via ORAL
  Filled 2017-07-07 (×4): qty 1

## 2017-07-07 MED ORDER — FUROSEMIDE 40 MG PO TABS
40.0000 mg | ORAL_TABLET | Freq: Every day | ORAL | Status: DC
  Administered 2017-07-07 – 2017-07-09 (×3): 40 mg via ORAL
  Filled 2017-07-07 (×3): qty 1

## 2017-07-07 NOTE — Progress Notes (Signed)
PROGRESS NOTE    Elizabeth Thompson  YNW:[id number] DOB: [birth year] DOA: 07/02/2017 PCP: Everardo Beals, NP   Brief Narrative:  66 y/o patient on chronic O2, severe pulm HTN on lasix and sildenafil, DM2. Lethargic for several days. Seen by PCP - found sodium "low" and came to ED for evaluation.  Hospital stay complicated by somnolence which is suspected to be secondary to hydroxyzine. She did develop IV infiltration of her left arm and developed some induration. Had a fever overnight.  Assessment & Plan:   Principal Problem:   Acute hyponatremia: Suspecting SIADH - Suspecting SIADH because of lab work and because of decrease in sodium levels with fluid administration.  - restarting lasix as such will decrease oral sodium regimen. - Remeron discontinued because of reported side effect of hyponatremia.  Cellulitis left arm - Patient developed this after IV infiltration. Areas indurated and patient developed fever. Obtained blood cultures 2 prior to administering antibiotics. Will start patient on Keflex. - May be contributing to somnolence some but I suspect hypersomnolence this morning was secondary to patient taking 50 mg of hydroxyzine at 11 at night  AMS - Patient has some hypersomnolence. As mentioned above I suspect it was secondary to hydroxyzine administered at 11 at night. Please see discussion above  Active Problems:   Hypertension - Pt off antihypertensive medications since her blood pressure was dropping with the initiation of Lasix and Sildenafil during previous admission    Chronic respiratory failure with hypoxia, on home O2 therapy ([place]) - Patient has pulmonary hypertension is on 3 L supplemental oxygen. - Continue patient on sildenafil    COPD (chronic obstructive pulmonary disease) (HCC) - stable - continue duonebs, continue Dulera    Diabetes mellitus with complication ([place]) - Continue patient on Diabetic diet - Discontinued oral hypoglycemic agents,  patient should not be discharge on metformin if serum creatinine 1.4 or above.  - covering with SSI    Pulmonary HTN ([place]) - Lasix currently on hold, patient is on sildenafil    Bipolar disorder ([employer]) - Continue home medication regimen.    Hyperlipidemia - Stable on statin    Acute metabolic encephalopathy - Resolving with improvement in serum creatinine    CKD (chronic kidney disease) stage 3, GFR 30-59 ml/min - Continue to monitor serum creatinine   DVT prophylaxis: Lovenox Code Status: Full Family Communication: discussed with patient and niece Disposition Plan: Pt developed cellulitis of left arm and mild fever. Had some AMS thought to be secondary to hydroxyzine. Will recommend continued monitoring   Consultants:   None   Procedures: None   Antimicrobials: Keflex   Subjective: Patient was somnolent this morning arousable but did not converse with examiner.  Objective: Vitals:   07/07/17 0500 07/07/17 0532 07/07/17 0947 07/07/17 1454  BP:  109/66 123/71 124/85  Pulse:  98 (!) 112 (!) 102  Resp:  _0 Temp:  (!) 100.7 F (38.2 C) 99.1 F (37.3 C) 98.7 F (37.1 C)  TempSrc:  Axillary Oral Oral  SpO2:  98%  100%  Weight: 95.1 kg (209 lb 9 oz)     Height:        Intake/Output Summary (Last 24 hours) at 07/07/17 1640 Last data filed at 07/06/17 2119  Gross per 24 hour  Intake                0 ml  Output              200 ml  Net             -  200 ml   Filed Weights   07/05/17 0500 07/06/17 0601 07/07/17 0500  Weight: 94.3 kg (207 lb 14.4 oz) 93.7 kg (206 lb 8 oz) 95.1 kg (209 lb 9 oz)    Examination:  General exam: PtSomnolent but arousable Respiratory system:  Equal chest rise, no wheezes, no increased work of breathing, nasal cannula in place,  Cardiovascular system: S1 & S2 heard, RRR. No JVD, murmurs, rubs, or gallops Gastrointestinal system: Abdomen is nondistended, soft and nontender. Normal bowel sounds heard. No guarding Central  nervous system: Somnolent but arousable, no facial asymmetry Extremities: Left arm had induration near IV site, painful to the touch Skin: Induration of left arm cubital fossa, warm and dry Psychiatry: Unable to accurately assess secondary to somnolence limited patient cooperation  Data Reviewed: I have personally reviewed following labs and imaging studies  CBC:  Recent Labs Lab 07/02/17 1431 07/03/17 0247  WBC 4.7 4.9  NEUTROABS 2.7  --   HGB 11.1* 10.3*  HCT 34.1* 32.3*  MCV 75.6* 75.6*  PLT 382 497   Basic Metabolic Panel:  Recent Labs Lab 07/02/17 1830  07/05/17 1422 07/05/17 2015 07/06/17 0303 07/06/17 0758 07/07/17 0513  NA  --   < > 127* 126* 124* 129* 132*  K  --   < > 4.3 4.7 4.3 4.4 4.7  CL  --   < > 97* 95* 96* 97* 101  CO2  --   < > 22 24 21* 23 21*  GLUCOSE  --   < > 147* 216* 127* 114* 110*  BUN  --   < > 21* 23* 23* 21* 21*  CREATININE  --   < > 1.79* 1.86* 1.54* 1.56* 1.38*  CALCIUM  --   < > 9.4 9.4 9.3 9.4 9.7  MG 1.8  --   --   --   --   --   --   PHOS 4.8*  --   --   --   --   --   --   < > = values in this interval not displayed. GFR: Estimated Creatinine Clearance: 44 mL/min (A) (by C-G formula based on SCr of 1.38 mg/dL (H)). Liver Function Tests:  Recent Labs Lab 07/02/17 1431 07/03/17 0247  AST 33 31  ALT 18 16  ALKPHOS 63 56  BILITOT 0.4 0.4  PROT 6.9 6.3*  ALBUMIN 3.8 3.4*   No results for input(s): LIPASE, AMYLASE in the last 168 hours. No results for input(s): AMMONIA in the last 168 hours. Coagulation Profile: No results for input(s): INR, PROTIME in the last 168 hours. Cardiac Enzymes: No results for input(s): CKTOTAL, CKMB, CKMBINDEX, TROPONINI in the last 168 hours. BNP (last 3 results)  Recent Labs  05/04/17 1634  PROBNP 160.0*   HbA1C: No results for input(s): HGBA1C in the last 72 hours. CBG:  Recent Labs Lab 07/06/17 1236 07/06/17 1717 07/06/17 2053 07/07/17 0746 07/07/17 1220  GLUCAP 157* 105* 105*  124* 109*   Lipid Profile: No results for input(s): CHOL, HDL, LDLCALC, TRIG, CHOLHDL, LDLDIRECT in the last 72 hours. Thyroid Function Tests: No results for input(s): TSH, T4TOTAL, FREET4, T3FREE, THYROIDAB in the last 72 hours. Anemia Panel: No results for input(s): VITAMINB12, FOLATE, FERRITIN, TIBC, IRON, RETICCTPCT in the last 72 hours. Sepsis Labs:  Recent Labs Lab 07/02/17 1438 07/02/17 1837  LATICACIDVEN 1.16 0.69    No results found for this or any previous visit (from the past 240 hour(s)).   Radiology Studies: No results  found.  Scheduled Meds: . ARIPiprazole  20 mg Oral QHS  . aspirin EC  81 mg Oral Daily  . atorvastatin  20 mg Oral QPC supper  . cephALEXin  500 mg Oral Q12H  . donepezil  5 mg Oral QHS  . dorzolamide-timolol  1 drop Both Eyes BID  . enoxaparin (LOVENOX) injection  40 mg Subcutaneous Q24H  . feeding supplement (ENSURE ENLIVE)  237 mL Oral BID BM  . furosemide  40 mg Oral Daily  . hydrOXYzine  50 mg Oral QHS  . insulin aspart  0-9 Units Subcutaneous TID WC  . lamoTRIgine  100 mg Oral QPC supper  . latanoprost  1 drop Both Eyes QHS  . magnesium oxide  200 mg Oral Daily  . mometasone-formoterol  2 puff Inhalation BID  . sildenafil  20 mg Oral TID  . sodium chloride flush  3 mL Intravenous Q12H  . sodium chloride  1 g Oral BID WC   Continuous Infusions:   LOS: 5 days    Time spent: > 35 minutes  Velvet Bathe, MD [facility] Pager [phone]  If 7PM-7AM, please contact night-coverage [email or web address] Password [email or web address] 07/07/2017, 4:40 PM

## 2017-07-08 DIAGNOSIS — I10 Essential (primary) hypertension: Secondary | ICD-10-CM

## 2017-07-08 DIAGNOSIS — J9611 Chronic respiratory failure with hypoxia: Secondary | ICD-10-CM

## 2017-07-08 DIAGNOSIS — G9341 Metabolic encephalopathy: Secondary | ICD-10-CM

## 2017-07-08 DIAGNOSIS — I272 Pulmonary hypertension, unspecified: Secondary | ICD-10-CM

## 2017-07-08 DIAGNOSIS — Z9981 Dependence on supplemental oxygen: Secondary | ICD-10-CM

## 2017-07-08 LAB — CBC
HCT: 31 % — ABNORMAL LOW (ref 36.0–46.0)
Hemoglobin: 9.8 g/dL — ABNORMAL LOW (ref 12.0–15.0)
MCH: 24.1 pg — AB (ref 26.0–34.0)
MCHC: 31.6 g/dL (ref 30.0–36.0)
MCV: 76.4 fL — ABNORMAL LOW (ref 78.0–100.0)
PLATELETS: 359 10*3/uL (ref 150–400)
RBC: 4.06 MIL/uL (ref 3.87–5.11)
RDW: 16 % — AB (ref 11.5–15.5)
WBC: 8 10*3/uL (ref 4.0–10.5)

## 2017-07-08 LAB — BLOOD GAS, ARTERIAL
ACID-BASE DEFICIT: 2.9 mmol/L — AB (ref 0.0–2.0)
Bicarbonate: 22 mmol/L (ref 20.0–28.0)
Drawn by: 257881
O2 CONTENT: 3 L/min
O2 SAT: 90.4 %
PH ART: 7.335 — AB (ref 7.350–7.450)
PO2 ART: 62.9 mmHg — AB (ref 83.0–108.0)
Patient temperature: 98.6
pCO2 arterial: 42.5 mmHg (ref 32.0–48.0)

## 2017-07-08 LAB — BASIC METABOLIC PANEL
Anion gap: 10 (ref 5–15)
BUN: 23 mg/dL — AB (ref 6–20)
CALCIUM: 9.7 mg/dL (ref 8.9–10.3)
CO2: 21 mmol/L — ABNORMAL LOW (ref 22–32)
Chloride: 102 mmol/L (ref 101–111)
Creatinine, Ser: 1.67 mg/dL — ABNORMAL HIGH (ref 0.44–1.00)
GFR calc non Af Amer: 31 mL/min — ABNORMAL LOW (ref >60)
GFR, EST AFRICAN AMERICAN: 36 mL/min — AB (ref >60)
Glucose, Bld: 100 mg/dL — ABNORMAL HIGH (ref 65–99)
Potassium: 4.3 mmol/L (ref 3.5–5.1)
SODIUM: 133 mmol/L — AB (ref 135–145)

## 2017-07-08 LAB — GLUCOSE, CAPILLARY
GLUCOSE-CAPILLARY: 110 mg/dL — AB (ref 65–99)
GLUCOSE-CAPILLARY: 136 mg/dL — AB (ref 65–99)
GLUCOSE-CAPILLARY: 92 mg/dL (ref 65–99)
Glucose-Capillary: 160 mg/dL — ABNORMAL HIGH (ref 65–99)

## 2017-07-08 NOTE — Progress Notes (Signed)
PROGRESS NOTE    Elizabeth Thompson  TAV:[id number] DOB: [birth year] DOA: 07/02/2017 PCP: Everardo Beals, NP   Brief Narrative: Elizabeth Thompson is a 66 y.o. that presented with lethargy and hyponatremia. She her hyponatremia is improved but somnolence has continued. She also developed cellulitis from infiltrated IV.   Assessment & Plan:   Principal Problem:   Acute hyponatremia Active Problems:   Hypertension   Chronic respiratory failure with hypoxia, on home O2 therapy (HCC)   COPD (chronic obstructive pulmonary disease) (HCC)   Diabetes mellitus without complication (HCC)   Pulmonary HTN (HCC)   Bipolar disorder (HCC)   Hyperlipidemia   Acute metabolic encephalopathy   CKD (chronic kidney disease) stage 3, GFR 30-59 ml/min  Hyponatremia Suspected SIADH. Worsened with IV fluids. Significantly improved with fluid restriction. -continue fluid restriction  Somnolence Not at baseline. Possibly secondary to Atarax, although she has been taking this at home. Patient with hypoxia as well. -ABG -Discontinue Atarax  Cellulitis Left arm. Improving -continue Keflex  CKD stage III Baseline appears to be around 1.9. Patient has some improvement down to 1.54.  COPD Stable -Continue DuoNeb and deliver  Diabetes mellitus -Continue SSI -May need to have metformin discontinued depending on kidney function  Pulmonary hypertension -Continue sildenafil  Bipolar disorder -Continue Abilify  Hyperlipidemia -Continue statin    DVT prophylaxis: Lovenox Code Status: Full code Family Communication: Niece at bedside Disposition Plan: Discharge in several days   Consultants:   None  Procedures:   None  Antimicrobials:  Keflex    Subjective: Patient reports no issues overnight. Febrile to 100.75F.  Objective: Vitals:   07/07/17 2211 07/07/17 2213 07/08/17 0553 07/08/17 1409  BP: (!) 104/55  (!) 114/91 (!) 124/31  Pulse: 95 (!) 103 97 76  Resp: _0 Temp: 98.7 F (37.1 C)  98.5 F (36.9 C) 98.6 F (37 C)  TempSrc: Oral  Oral Oral  SpO2: 92% 100% 97% 92%  Weight:      Height:        Intake/Output Summary (Last 24 hours) at 07/08/17 1453 Last data filed at 07/08/17 0830  Gross per 24 hour  Intake                0 ml  Output              200 ml  Net             -200 ml   Filed Weights   07/05/17 0500 07/06/17 0601 07/07/17 0500  Weight: 94.3 kg (207 lb 14.4 oz) 93.7 kg (206 lb 8 oz) 95.1 kg (209 lb 9 oz)    Examination:  General exam: Appears calm and comfortable Respiratory system: Clear to auscultation. Respiratory effort normal. Cardiovascular system: S1 & S2 heard, RRR. No murmurs. Gastrointestinal system: Abdomen is nondistended, soft and nontender. Normal bowel sounds heard. Central nervous system: Somnolent but arousable and oriented. No focal neurological deficits. Extremities: No edema. No calf tenderness Skin: No cyanosis. Ecchymotic lesion on left antecubital region without warmth Psychiatry: Judgement and insight appear normal. Mood & affect depressed and flat.     Data Reviewed: I have personally reviewed following labs and imaging studies  CBC:  Recent Labs Lab 07/02/17 1431 07/03/17 0247 07/08/17 0316  WBC 4.7 4.9 8.0  NEUTROABS 2.7  --   --   HGB 11.1* 10.3* 9.8*  HCT 34.1* 32.3* 31.0*  MCV 75.6* 75.6* 76.4*  PLT 382 385 359  Basic Metabolic Panel:  Recent Labs Lab 07/02/17 1830  07/05/17 2015 07/06/17 0303 07/06/17 0758 07/07/17 0513 07/08/17 0316  NA  --   < > 126* 124* 129* 132* 133*  K  --   < > 4.7 4.3 4.4 4.7 4.3  CL  --   < > 95* 96* 97* 101 102  CO2  --   < > 24 21* 23 21* 21*  GLUCOSE  --   < > 216* 127* 114* 110* 100*  BUN  --   < > 23* 23* 21* 21* 23*  CREATININE  --   < > 1.86* 1.54* 1.56* 1.38* 1.67*  CALCIUM  --   < > 9.4 9.3 9.4 9.7 9.7  MG 1.8  --   --   --   --   --   --   PHOS 4.8*  --   --   --   --   --   --   < > = values in this interval not  displayed. GFR: Estimated Creatinine Clearance: 36.4 mL/min (A) (by C-G formula based on SCr of 1.67 mg/dL (H)). Liver Function Tests:  Recent Labs Lab 07/02/17 1431 07/03/17 0247  AST 33 31  ALT 18 16  ALKPHOS 63 56  BILITOT 0.4 0.4  PROT 6.9 6.3*  ALBUMIN 3.8 3.4*   No results for input(s): LIPASE, AMYLASE in the last 168 hours. No results for input(s): AMMONIA in the last 168 hours. Coagulation Profile: No results for input(s): INR, PROTIME in the last 168 hours. Cardiac Enzymes: No results for input(s): CKTOTAL, CKMB, CKMBINDEX, TROPONINI in the last 168 hours. BNP (last 3 results)  Recent Labs  05/04/17 1634  PROBNP 160.0*   HbA1C: No results for input(s): HGBA1C in the last 72 hours. CBG:  Recent Labs Lab 07/07/17 1220 07/07/17 1655 07/07/17 2210 07/08/17 0817 07/08/17 1232  GLUCAP 109* 131* 149* 110* 160*   Lipid Profile: No results for input(s): CHOL, HDL, LDLCALC, TRIG, CHOLHDL, LDLDIRECT in the last 72 hours. Thyroid Function Tests: No results for input(s): TSH, T4TOTAL, FREET4, T3FREE, THYROIDAB in the last 72 hours. Anemia Panel: No results for input(s): VITAMINB12, FOLATE, FERRITIN, TIBC, IRON, RETICCTPCT in the last 72 hours. Sepsis Labs:  Recent Labs Lab 07/02/17 1438 07/02/17 1837  LATICACIDVEN 1.16 0.69    Recent Results (from the past 240 hour(s))  Culture, blood (Routine X 2) w Reflex to ID Panel     Status: None (Preliminary result)   Collection Time: 07/07/17 10:39 AM  Result Value Ref Range Status   Specimen Description BLOOD RIGHT ARM  Final   Special Requests IN PEDIATRIC BOTTLE Blood Culture adequate volume  Final   Culture NO GROWTH 1 DAY  Final   Report Status PENDING  Incomplete  Culture, blood (Routine X 2) w Reflex to ID Panel     Status: None (Preliminary result)   Collection Time: 07/07/17 10:39 AM  Result Value Ref Range Status   Specimen Description BLOOD RIGHT HAND  Final   Special Requests IN PEDIATRIC BOTTLE  Blood Culture adequate volume  Final   Culture NO GROWTH 1 DAY  Final   Report Status PENDING  Incomplete         Radiology Studies: No results found.      Scheduled Meds: . ARIPiprazole  20 mg Oral QHS  . aspirin EC  81 mg Oral Daily  . atorvastatin  20 mg Oral QPC supper  . cephALEXin  500 mg Oral Q12H  .  donepezil  5 mg Oral QHS  . dorzolamide-timolol  1 drop Both Eyes BID  . enoxaparin (LOVENOX) injection  40 mg Subcutaneous Q24H  . feeding supplement (ENSURE ENLIVE)  237 mL Oral BID BM  . furosemide  40 mg Oral Daily  . insulin aspart  0-9 Units Subcutaneous TID WC  . lamoTRIgine  100 mg Oral QPC supper  . latanoprost  1 drop Both Eyes QHS  . magnesium oxide  200 mg Oral Daily  . mometasone-formoterol  2 puff Inhalation BID  . sildenafil  20 mg Oral TID  . sodium chloride flush  3 mL Intravenous Q12H  . sodium chloride  1 g Oral BID WC   Continuous Infusions:   LOS: 6 days     Cordelia Poche, MD [facility] 07/08/2017, 2:53 PM Pager: [phone]  If 7PM-7AM, please contact night-coverage [email or web address] Password [facility] 07/08/2017, 2:53 PM

## 2017-07-08 NOTE — Progress Notes (Signed)
Physical Therapy Treatment Patient Details Name: Elizabeth Thompson MRN: [id number] DOB: 1951-01-26 Today's Date: 07/08/2017    History of Present Illness pt is a 66 y/o female with phm significant for COPD on 3L O2, recent dx of sever pulmanary HTN, DM, bipolar d/o, admitted with lethargy for several days.  Found to have acute hyponatremia.  Work up continues.    PT Comments    Pt has had a significant functional decline since last therapy session due to increased lethargy. Pt requires Mod-Max A +2 for bed mobility and transfers this session. Once assisted to standing, pt is able to weight bear and assist with stand pivot transfer to recliner. Deferred any gait activities this session. [name] continues to want to bring pt home at discharge assuming she improves. Pt and niece are resistance to rehab at discharge.     Follow Up Recommendations  Home health PT;Supervision/Assistance - 24 hour;Other (comment) (work up to going to [employer])     Equipment Recommendations  None recommended by PT    Recommendations for Other Services       Precautions / Restrictions Precautions Precautions: Fall Restrictions Weight Bearing Restrictions: No    Mobility  Bed Mobility Overal bed mobility: Needs Assistance Bed Mobility: Supine to Sit     Supine to sit: Mod assist;+2 for physical assistance     General bed mobility comments: Mod A +2 to scoot to EOB and to maintain balance at EOB initally.   Transfers Overall transfer level: Needs assistance Equipment used: Rolling walker (2 wheeled) Transfers: Sit to/from [facility] Sit to Stand: Mod assist;+2 safety/equipment Stand pivot transfers: Mod assist;+2 safety/equipment       General transfer comment: Mod A to stand from EOB +2 for assistance throughout transfer due to increased lethargy this session.   Ambulation/Gait             General Gait Details: unable to attempt this session due to lethargy   Stairs            Wheelchair Mobility    Modified Rankin (Stroke Patients Only)       Balance Overall balance assessment: Needs assistance Sitting-balance support: Bilateral upper extremity supported;Feet supported Sitting balance-Leahy Scale: Poor Sitting balance - Comments: left lateral lean in sitting with Mod A to maintain sitting upright at EOB and cues to look outside to keep head upright Postural control: Right lateral lean Standing balance support: Bilateral upper extremity supported Standing balance-Leahy Scale: Poor Standing balance comment: reliant on RW for stability in standing                            Cognition Arousal/Alertness: Lethargic Behavior During Therapy: WFL for tasks assessed/performed Overall Cognitive Status: Within Functional Limits for tasks assessed                                        Exercises      General Comments        Pertinent Vitals/Pain Pain Assessment: No/denies pain    Home Living                      Prior Function            PT Goals (current goals can now be found in the care plan section) Acute Rehab PT Goals Patient Stated Goal:  I want to go home Progress towards PT goals: Not progressing toward goals - comment (significant decline noted this session since previous eval)    Frequency    Min 3X/week      PT Plan Current plan remains appropriate    Co-evaluation              AM-PAC PT "6 Clicks" Daily Activity  Outcome Measure  Difficulty turning over in bed (including adjusting bedclothes, sheets and blankets)?: Total Difficulty moving from lying on back to sitting on the side of the bed? : Total Difficulty sitting down on and standing up from a chair with arms (e.g., wheelchair, bedside commode, etc,.)?: Total Help needed moving to and from a bed to chair (including a wheelchair)?: A Lot Help needed walking in hospital room?: Total Help needed climbing 3-5 steps  with a railing? : Total 6 Click Score: 7    End of Session Equipment Utilized During Treatment: Gait belt;Oxygen Activity Tolerance: Patient limited by lethargy Patient left: in chair;with call bell/phone within reach;with chair alarm set;with family/visitor present Nurse Communication: Mobility status PT Visit Diagnosis: Unsteadiness on feet (R26.81);Muscle weakness (generalized) (M62.81);Other abnormalities of gait and mobility (R26.89)     Time: 0233-4356 PT Time Calculation (min) (ACUTE ONLY): 26 min  Charges:  $Therapeutic Activity: 23-37 mins                    G Codes:       Scheryl Marten PT, DPT  [phone]    Jacqulyn Liner Sloan Leiter 07/08/2017, 1:13 PM

## 2017-07-09 ENCOUNTER — Inpatient Hospital Stay (HOSPITAL_COMMUNITY): Payer: Medicare Other

## 2017-07-09 DIAGNOSIS — E119 Type 2 diabetes mellitus without complications: Secondary | ICD-10-CM

## 2017-07-09 DIAGNOSIS — N183 Chronic kidney disease, stage 3 (moderate): Secondary | ICD-10-CM

## 2017-07-09 DIAGNOSIS — M79609 Pain in unspecified limb: Secondary | ICD-10-CM

## 2017-07-09 LAB — COMPREHENSIVE METABOLIC PANEL
ALT: 18 U/L (ref 14–54)
AST: 26 U/L (ref 15–41)
Albumin: 2.9 g/dL — ABNORMAL LOW (ref 3.5–5.0)
Alkaline Phosphatase: 73 U/L (ref 38–126)
Anion gap: 11 (ref 5–15)
BUN: 24 mg/dL — ABNORMAL HIGH (ref 6–20)
CHLORIDE: 103 mmol/L (ref 101–111)
CO2: 22 mmol/L (ref 22–32)
Calcium: 9.7 mg/dL (ref 8.9–10.3)
Creatinine, Ser: 1.82 mg/dL — ABNORMAL HIGH (ref 0.44–1.00)
GFR, EST AFRICAN AMERICAN: 32 mL/min — AB (ref >60)
GFR, EST NON AFRICAN AMERICAN: 28 mL/min — AB (ref >60)
Glucose, Bld: 87 mg/dL (ref 65–99)
POTASSIUM: 4.1 mmol/L (ref 3.5–5.1)
Sodium: 136 mmol/L (ref 135–145)
Total Bilirubin: 0.5 mg/dL (ref 0.3–1.2)
Total Protein: 6.4 g/dL — ABNORMAL LOW (ref 6.5–8.1)

## 2017-07-09 LAB — GLUCOSE, CAPILLARY
GLUCOSE-CAPILLARY: 203 mg/dL — AB (ref 65–99)
Glucose-Capillary: 86 mg/dL (ref 65–99)
Glucose-Capillary: 152 mg/dL — ABNORMAL HIGH (ref 65–99)
Glucose-Capillary: 148 mg/dL — ABNORMAL HIGH (ref 65–99)

## 2017-07-09 LAB — AMMONIA: AMMONIA: 16 umol/L (ref 9–35)

## 2017-07-09 MED ORDER — SODIUM CHLORIDE 0.9% FLUSH
3.0000 mL | Freq: Two times a day (BID) | INTRAVENOUS | Status: DC
  Administered 2017-07-10 – 2017-07-13 (×7): 3 mL via INTRAVENOUS

## 2017-07-09 MED ORDER — SODIUM CHLORIDE 0.9 % IV BOLUS (SEPSIS): 250.0000 mL | Freq: Once | INTRAVENOUS | Status: DC

## 2017-07-09 NOTE — Progress Notes (Signed)
PROGRESS NOTE    Elizabeth Thompson  TMH:[id number] DOB: [birth year] DOA: 07/02/2017 PCP: Everardo Beals, NP   Brief Narrative: Elizabeth Thompson is a 66 y.o. that presented with lethargy and hyponatremia. She her hyponatremia is improved but somnolence has continued. She also developed cellulitis from infiltrated IV.   Assessment & Plan:   Principal Problem:   Acute hyponatremia Active Problems:   Hypertension   Chronic respiratory failure with hypoxia, on home O2 therapy (HCC)   COPD (chronic obstructive pulmonary disease) (HCC)   Diabetes mellitus without complication (HCC)   Pulmonary HTN (HCC)   Bipolar disorder (HCC)   Hyperlipidemia   Acute metabolic encephalopathy   CKD (chronic kidney disease) stage 3, GFR 30-59 ml/min  Hyponatremia Suspected SIADH. Worsened with IV fluids. Significantly improved with fluid restriction. Resolved. -discontinue salt tabs  Somnolence Improved with discontinuation of Atarax.  Cellulitis Left arm. Improving -continue Keflex  CKD stage III Baseline appears to be around 1.9. Patient has some improvement down to 1.54. Up again to 1.82. Soft blood pressures -BMP in AM -urine sodium/creatinine  COPD Stable -Continue DuoNeb and deliver  Diabetes mellitus -Continue SSI -May need to have metformin discontinued depending on kidney function  Pulmonary hypertension -Continue sildenafil  Bipolar disorder -Continue Abilify  Hyperlipidemia -Continue statin    DVT prophylaxis: Lovenox Code Status: Full code Family Communication: Niece at bedside Disposition Plan: Discharge in likely 24-48 hours if improving.   Consultants:   None  Procedures:   None  Antimicrobials:  Keflex    Subjective: Afebrile overnight. More alert. Left arm pain.  Objective: Vitals:   07/08/17 1409 07/08/17 1948 07/08/17 2132 07/09/17 0546  BP: (!) 124/31  (!) 103/57 103/67  Pulse: 76 (!) 102 95 99  Resp: _0 Temp: 98.6 F  (37 C)  98 F (36.7 C) 98.2 F (36.8 C)  TempSrc: Oral  Oral Oral  SpO2: 92% 92% 95% 98%  Weight:      Height:       No intake or output data in the 24 hours ending 07/09/17 1029 Filed Weights   07/05/17 0500 07/06/17 0601 07/07/17 0500  Weight: 94.3 kg (207 lb 14.4 oz) 93.7 kg (206 lb 8 oz) 95.1 kg (209 lb 9 oz)    Examination:  General exam: Appears calm and comfortable Respiratory system: Clear to auscultation. Respiratory effort normal. Cardiovascular system: S1 & S2 heard, RRR. No murmurs. Gastrointestinal system: Abdomen is nondistended, soft and nontender. Normal bowel sounds heard. Central nervous system: Alert, oriented.  Extremities: No edema. No calf tenderness Skin: No cyanosis. Warmth over left antecubital fossa (patient had warm compresses on recently). Mild ecchymosis without much erythema. Slightly swollen. Psychiatry: Judgement and insight appear normal. Mood & affect depressed and flat.     Data Reviewed: I have personally reviewed following labs and imaging studies  CBC:  Recent Labs Lab 07/02/17 1431 07/03/17 0247 07/08/17 0316  WBC 4.7 4.9 8.0  NEUTROABS 2.7  --   --   HGB 11.1* 10.3* 9.8*  HCT 34.1* 32.3* 31.0*  MCV 75.6* 75.6* 76.4*  PLT 382 385 921   Basic Metabolic Panel:  Recent Labs Lab 07/02/17 1830  07/06/17 0303 07/06/17 0758 07/07/17 0513 07/08/17 0316 07/09/17 0443  NA  --   < > 124* 129* 132* 133* 136  K  --   < > 4.3 4.4 4.7 4.3 4.1  CL  --   < > 96* 97* 101 102 103  CO2  --   < >  21* 23 21* 21* 22  GLUCOSE  --   < > 127* 114* 110* 100* 87  BUN  --   < > 23* 21* 21* 23* 24*  CREATININE  --   < > 1.54* 1.56* 1.38* 1.67* 1.82*  CALCIUM  --   < > 9.3 9.4 9.7 9.7 9.7  MG 1.8  --   --   --   --   --   --   PHOS 4.8*  --   --   --   --   --   --   < > = values in this interval not displayed. GFR: Estimated Creatinine Clearance: 33.4 mL/min (A) (by C-G formula based on SCr of 1.82 mg/dL (H)). Liver Function Tests:  Recent  Labs Lab 07/02/17 1431 07/03/17 0247 07/09/17 0443  AST 33 31 26  ALT _0 ALKPHOS 63 56 73  BILITOT 0.4 0.4 0.5  PROT 6.9 6.3* 6.4*  ALBUMIN 3.8 3.4* 2.9*   No results for input(s): LIPASE, AMYLASE in the last 168 hours.  Recent Labs Lab 07/09/17 0443  AMMONIA 16   Coagulation Profile: No results for input(s): INR, PROTIME in the last 168 hours. Cardiac Enzymes: No results for input(s): CKTOTAL, CKMB, CKMBINDEX, TROPONINI in the last 168 hours. BNP (last 3 results)  Recent Labs  05/04/17 1634  PROBNP 160.0*   HbA1C: No results for input(s): HGBA1C in the last 72 hours. CBG:  Recent Labs Lab 07/08/17 0817 07/08/17 1232 07/08/17 1725 07/08/17 2141 07/09/17 0758  GLUCAP 110* 160* 136* 92 86   Lipid Profile: No results for input(s): CHOL, HDL, LDLCALC, TRIG, CHOLHDL, LDLDIRECT in the last 72 hours. Thyroid Function Tests: No results for input(s): TSH, T4TOTAL, FREET4, T3FREE, THYROIDAB in the last 72 hours. Anemia Panel: No results for input(s): VITAMINB12, FOLATE, FERRITIN, TIBC, IRON, RETICCTPCT in the last 72 hours. Sepsis Labs:  Recent Labs Lab 07/02/17 1438 07/02/17 1837  LATICACIDVEN 1.16 0.69    Recent Results (from the past 240 hour(s))  Culture, blood (Routine X 2) w Reflex to ID Panel     Status: None (Preliminary result)   Collection Time: 07/07/17 10:39 AM  Result Value Ref Range Status   Specimen Description BLOOD RIGHT ARM  Final   Special Requests IN PEDIATRIC BOTTLE Blood Culture adequate volume  Final   Culture NO GROWTH 1 DAY  Final   Report Status PENDING  Incomplete  Culture, blood (Routine X 2) w Reflex to ID Panel     Status: None (Preliminary result)   Collection Time: 07/07/17 10:39 AM  Result Value Ref Range Status   Specimen Description BLOOD RIGHT HAND  Final   Special Requests IN PEDIATRIC BOTTLE Blood Culture adequate volume  Final   Culture NO GROWTH 1 DAY  Final   Report Status PENDING  Incomplete          Radiology Studies: No results found.      Scheduled Meds: . ARIPiprazole  20 mg Oral QHS  . aspirin EC  81 mg Oral Daily  . atorvastatin  20 mg Oral QPC supper  . cephALEXin  500 mg Oral Q12H  . donepezil  5 mg Oral QHS  . dorzolamide-timolol  1 drop Both Eyes BID  . enoxaparin (LOVENOX) injection  40 mg Subcutaneous Q24H  . feeding supplement (ENSURE ENLIVE)  237 mL Oral BID BM  . furosemide  40 mg Oral Daily  . insulin aspart  0-9 Units Subcutaneous TID WC  .  lamoTRIgine  100 mg Oral QPC supper  . latanoprost  1 drop Both Eyes QHS  . magnesium oxide  200 mg Oral Daily  . mometasone-formoterol  2 puff Inhalation BID  . sildenafil  20 mg Oral TID  . sodium chloride flush  3 mL Intravenous Q12H  . sodium chloride  1 g Oral BID WC   Continuous Infusions:   LOS: 7 days     Cordelia Poche, MD [facility] 07/09/2017, 10:29 AM Pager: [phone]  If 7PM-7AM, please contact night-coverage [email or web address] Password [facility] 07/09/2017, 10:29 AM

## 2017-07-09 NOTE — Progress Notes (Signed)
*  Preliminary Results* Left upper extremity venous duplex completed. Left upper extremity is negative for deep and vein thrombosis. There is evidence of acute superficial vein thrombosis involving the left cephalic vein.  Preliminary results discussed with Dr. Lonny Prude.  07/09/2017 3:34 PM  Maudry Mayhew, BS, RVT, RDCS, RDMS

## 2017-07-10 ENCOUNTER — Inpatient Hospital Stay (HOSPITAL_COMMUNITY): Payer: Medicare Other

## 2017-07-10 DIAGNOSIS — E785 Hyperlipidemia, unspecified: Secondary | ICD-10-CM

## 2017-07-10 LAB — BLOOD GAS, ARTERIAL
ACID-BASE DEFICIT: 2.5 mmol/L — AB (ref 0.0–2.0)
BICARBONATE: 22.1 mmol/L (ref 20.0–28.0)
DRAWN BY: 24513
O2 Content: 3 L/min
O2 Saturation: 83 %
PCO2 ART: 40.3 mmHg (ref 32.0–48.0)
PH ART: 7.358 (ref 7.350–7.450)
PO2 ART: 52.1 mmHg — AB (ref 83.0–108.0)
Patient temperature: 98.6

## 2017-07-10 LAB — BASIC METABOLIC PANEL
ANION GAP: 8 (ref 5–15)
BUN: 31 mg/dL — ABNORMAL HIGH (ref 6–20)
CALCIUM: 9.6 mg/dL (ref 8.9–10.3)
CO2: 24 mmol/L (ref 22–32)
Chloride: 101 mmol/L (ref 101–111)
Creatinine, Ser: 1.94 mg/dL — ABNORMAL HIGH (ref 0.44–1.00)
GFR, EST AFRICAN AMERICAN: 30 mL/min — AB (ref >60)
GFR, EST NON AFRICAN AMERICAN: 26 mL/min — AB (ref >60)
Glucose, Bld: 128 mg/dL — ABNORMAL HIGH (ref 65–99)
Potassium: 4.1 mmol/L (ref 3.5–5.1)
SODIUM: 133 mmol/L — AB (ref 135–145)

## 2017-07-10 LAB — URINE CULTURE: Culture: <10000 — AB

## 2017-07-10 LAB — GLUCOSE, CAPILLARY
GLUCOSE-CAPILLARY: 107 mg/dL — AB (ref 65–99)
GLUCOSE-CAPILLARY: 258 mg/dL — AB (ref 65–99)
GLUCOSE-CAPILLARY: 181 mg/dL — AB (ref 65–99)
Glucose-Capillary: 226 mg/dL — ABNORMAL HIGH (ref 65–99)

## 2017-07-10 LAB — SODIUM, URINE, RANDOM: Sodium, Ur: 24 mmol/L

## 2017-07-10 LAB — CREATININE, URINE, RANDOM: Creatinine, Urine: 248.65 mg/dL

## 2017-07-10 MED ORDER — FUROSEMIDE 10 MG/ML IJ SOLN
INTRAMUSCULAR | Status: AC
  Administered 2017-07-10: 40 mg via INTRAVENOUS
  Filled 2017-07-10: qty 4

## 2017-07-10 MED ORDER — BOOST / RESOURCE BREEZE PO LIQD
1.0000 | Freq: Three times a day (TID) | ORAL | Status: DC
  Administered 2017-07-10 – 2017-07-12 (×6): 1 via ORAL

## 2017-07-10 MED ORDER — FUROSEMIDE 10 MG/ML IJ SOLN
40.0000 mg | Freq: Once | INTRAMUSCULAR | Status: AC
Start: 2017-07-10 — End: 2017-07-10
  Administered 2017-07-10: 40 mg via INTRAVENOUS
  Filled 2017-07-10: qty 4

## 2017-07-10 NOTE — Progress Notes (Signed)
Paged MD, patient short of breath, respirations 24, O2 sat 84 on 2L, increased O2 to 6 liters and sat increased to 90%, JVD present.  Orders received for CXR and ABGs.  RN called RT and requested PRN Duoneb and made her aware ABG ordered.  Other VSS, will continue to monitor patient.  P.J. Linus Mako, RN

## 2017-07-10 NOTE — Plan of Care (Signed)
Problem: Education: Goal: Knowledge of [facility] Education information/materials will improve Outcome: Progressing [name] knowledge of disease process will improve prior to discharge.

## 2017-07-10 NOTE — Progress Notes (Signed)
PROGRESS NOTE    KELIAH HARNED  DXI:[id number] DOB: 05-26-1951 DOA: 07/02/2017 PCP: Everardo Beals, NP   Brief Narrative: Elizabeth Thompson is a 66 y.o. that presented with lethargy and hyponatremia. She her hyponatremia is improved but somnolence has continued. She also developed cellulitis from infiltrated IV.   Assessment & Plan:   Principal Problem:   Acute hyponatremia Active Problems:   Hypertension   Chronic respiratory failure with hypoxia, on home O2 therapy (HCC)   COPD (chronic obstructive pulmonary disease) (HCC)   Diabetes mellitus without complication (HCC)   Pulmonary HTN (HCC)   Bipolar disorder (HCC)   Hyperlipidemia   Acute metabolic encephalopathy   CKD (chronic kidney disease) stage 3, GFR 30-59 ml/min  Hyponatremia Suspected SIADH initially but upon review of previous workup, appears more so secondary to hypovolemia. Initial concern, however, is that it worsened with IV fluids. Not sure if improvement was secondary to fluid restriction/lasix vs salt tablets. Urine osmolality/sodium suggesting hypovolemia. -encourage good oral intake -hold on IV fluids for now, but may give some if no adequate oral fluid intake  Somnolence Improved with discontinuation of Atarax.  Superficial thrombus Etiology of arm warmth, swelling and pain. -discontinue Keflex -warm compresses -unfortunately, very hesitant to use NSAIDs in this patient with kidney injury  CKD stage III Baseline appears to be around 1.8-1.9. Patient has some improvement down to 1.54 earlier in admission. Up again to 1.94. Blood pressure improved since stopping lasix. FENa suggesting prerenal. -BMP in AM -fluid intake  COPD Stable -Continue DuoNeb and deliver  Diabetes mellitus -Continue SSI -May need to have metformin discontinued depending on kidney function  Pulmonary hypertension -Continue sildenafil  Bipolar disorder -Continue Abilify  Hyperlipidemia -Continue  statin    DVT prophylaxis: Lovenox Code Status: Full code Family Communication: Niece at bedside Disposition Plan: Discharge in likely 24-48 hours if improving.   Consultants:   None  Procedures:   None  Antimicrobials:  Keflex    Subjective: Left arm pain.  Objective: Vitals:   07/09/17 1433 07/09/17 2102 07/10/17 0533 07/10/17 0806  BP: 102/64 105/62 120/81   Pulse: 95 90 83   Resp: _0 Temp: 98.9 F (37.2 C) 98.5 F (36.9 C) 98.2 F (36.8 C)   TempSrc: Oral Oral Oral   SpO2: 96% 95% 100% (!) 88%  Weight:      Height:        Intake/Output Summary (Last 24 hours) at 07/10/17 1104 Last data filed at 07/10/17 1057  Gross per 24 hour  Intake             4841 ml  Output              201 ml  Net             4640 ml   Filed Weights   07/05/17 0500 07/06/17 0601 07/07/17 0500  Weight: 94.3 kg (207 lb 14.4 oz) 93.7 kg (206 lb 8 oz) 95.1 kg (209 lb 9 oz)    Examination:  General exam: Appears calm and comfortable Respiratory system: Clear to auscultation bilaterally. Unlabored work of breathing. No wheezing or rales. Cardiovascular system: Regular rate and rhythm. Normal S1 and S2. No heart murmurs present. No extra heart sounds Gastrointestinal system: Soft, non-tender, non-distended, no guarding, no rebound, no masses felt Central nervous system: Alert, oriented.  Extremities: No edema. No calf tenderness Skin: No cyanosis. Warmth over left antecubital fossa. Mild ecchymosis without much erythema. Swollen. Psychiatry: Judgement and  insight appear normal. Mood & affect depressed and flat.     Data Reviewed: I have personally reviewed following labs and imaging studies  CBC:  Recent Labs Lab 07/08/17 0316  WBC 8.0  HGB 9.8*  HCT 31.0*  MCV 76.4*  PLT 254   Basic Metabolic Panel:  Recent Labs Lab 07/06/17 0758 07/07/17 0513 07/08/17 0316 07/09/17 0443 07/10/17 0344  NA 129* 132* 133* 136 133*  K 4.4 4.7 4.3 4.1 4.1  CL 97* 101  102 103 101  CO2 23 21* 21* 22 24  GLUCOSE 114* 110* 100* 87 128*  BUN 21* 21* 23* 24* 31*  CREATININE 1.56* 1.38* 1.67* 1.82* 1.94*  CALCIUM 9.4 9.7 9.7 9.7 9.6   GFR: Estimated Creatinine Clearance: 31.3 mL/min (A) (by C-G formula based on SCr of 1.94 mg/dL (H)). Liver Function Tests:  Recent Labs Lab 07/09/17 0443  AST 26  ALT 18  ALKPHOS 73  BILITOT 0.5  PROT 6.4*  ALBUMIN 2.9*   No results for input(s): LIPASE, AMYLASE in the last 168 hours.  Recent Labs Lab 07/09/17 0443  AMMONIA 16   Coagulation Profile: No results for input(s): INR, PROTIME in the last 168 hours. Cardiac Enzymes: No results for input(s): CKTOTAL, CKMB, CKMBINDEX, TROPONINI in the last 168 hours. BNP (last 3 results)  Recent Labs  05/04/17 1634  PROBNP 160.0*   HbA1C: No results for input(s): HGBA1C in the last 72 hours. CBG:  Recent Labs Lab 07/09/17 0758 07/09/17 1158 07/09/17 1728 07/09/17 2105 07/10/17 0754  GLUCAP 86 203* 152* 148* 107*   Lipid Profile: No results for input(s): CHOL, HDL, LDLCALC, TRIG, CHOLHDL, LDLDIRECT in the last 72 hours. Thyroid Function Tests: No results for input(s): TSH, T4TOTAL, FREET4, T3FREE, THYROIDAB in the last 72 hours. Anemia Panel: No results for input(s): VITAMINB12, FOLATE, FERRITIN, TIBC, IRON, RETICCTPCT in the last 72 hours. Sepsis Labs: No results for input(s): PROCALCITON, LATICACIDVEN in the last 168 hours.  Recent Results (from the past 240 hour(s))  Culture, blood (Routine X 2) w Reflex to ID Panel     Status: None (Preliminary result)   Collection Time: 07/07/17 10:39 AM  Result Value Ref Range Status   Specimen Description BLOOD RIGHT ARM  Final   Special Requests IN PEDIATRIC BOTTLE Blood Culture adequate volume  Final   Culture NO GROWTH 2 DAYS  Final   Report Status PENDING  Incomplete  Culture, blood (Routine X 2) w Reflex to ID Panel     Status: None (Preliminary result)   Collection Time: 07/07/17 10:39 AM  Result  Value Ref Range Status   Specimen Description BLOOD RIGHT HAND  Final   Special Requests IN PEDIATRIC BOTTLE Blood Culture adequate volume  Final   Culture NO GROWTH 2 DAYS  Final   Report Status PENDING  Incomplete  Urine Culture     Status: Abnormal   Collection Time: 07/09/17  5:09 AM  Result Value Ref Range Status   Specimen Description URINE, RANDOM  Final   Special Requests NONE  Final   Culture <10,000 COLONIES/mL INSIGNIFICANT GROWTH (A)  Final   Report Status 07/10/2017 FINAL  Final         Radiology Studies: No results found.      Scheduled Meds: . ARIPiprazole  20 mg Oral QHS  . aspirin EC  81 mg Oral Daily  . atorvastatin  20 mg Oral QPC supper  . cephALEXin  500 mg Oral Q12H  . donepezil  5 mg Oral  QHS  . dorzolamide-timolol  1 drop Both Eyes BID  . enoxaparin (LOVENOX) injection  40 mg Subcutaneous Q24H  . feeding supplement  1 Container Oral TID BM  . insulin aspart  0-9 Units Subcutaneous TID WC  . lamoTRIgine  100 mg Oral QPC supper  . latanoprost  1 drop Both Eyes QHS  . magnesium oxide  200 mg Oral Daily  . mometasone-formoterol  2 puff Inhalation BID  . sildenafil  20 mg Oral TID  . sodium chloride flush  3 mL Intravenous Q12H   Continuous Infusions:   LOS: 8 days     Cordelia Poche, MD [facility] 07/10/2017, 11:04 AM Pager: [phone]  If 7PM-7AM, please contact night-coverage [email or web address] Password [facility] 07/10/2017, 11:04 AM

## 2017-07-11 ENCOUNTER — Other Ambulatory Visit: Payer: Self-pay

## 2017-07-11 ENCOUNTER — Inpatient Hospital Stay (HOSPITAL_COMMUNITY): Payer: Medicare Other

## 2017-07-11 DIAGNOSIS — R0603 Acute respiratory distress: Secondary | ICD-10-CM

## 2017-07-11 LAB — BASIC METABOLIC PANEL
Anion gap: 9 (ref 5–15)
BUN: 34 mg/dL — ABNORMAL HIGH (ref 6–20)
CALCIUM: 9.8 mg/dL (ref 8.9–10.3)
CO2: 24 mmol/L (ref 22–32)
Chloride: 103 mmol/L (ref 101–111)
Creatinine, Ser: 1.8 mg/dL — ABNORMAL HIGH (ref 0.44–1.00)
GFR, EST AFRICAN AMERICAN: 33 mL/min — AB (ref >60)
GFR, EST NON AFRICAN AMERICAN: 28 mL/min — AB (ref >60)
Glucose, Bld: 127 mg/dL — ABNORMAL HIGH (ref 65–99)
Potassium: 4 mmol/L (ref 3.5–5.1)
Sodium: 136 mmol/L (ref 135–145)

## 2017-07-11 LAB — GLUCOSE, CAPILLARY
GLUCOSE-CAPILLARY: 275 mg/dL — AB (ref 65–99)
GLUCOSE-CAPILLARY: 168 mg/dL — AB (ref 65–99)
GLUCOSE-CAPILLARY: 201 mg/dL — AB (ref 65–99)
Glucose-Capillary: 133 mg/dL — ABNORMAL HIGH (ref 65–99)

## 2017-07-11 LAB — UREA NITROGEN, URINE: UREA NITROGEN UR: 737 mg/dL

## 2017-07-11 LAB — TROPONIN I: TROPONIN I: 0.04 ng/mL — AB (ref <0.03)

## 2017-07-11 MED ORDER — FUROSEMIDE 40 MG PO TABS
40.0000 mg | ORAL_TABLET | Freq: Every day | ORAL | Status: DC
  Administered 2017-07-11 – 2017-07-13 (×3): 40 mg via ORAL
  Filled 2017-07-11 (×3): qty 1

## 2017-07-11 NOTE — Progress Notes (Signed)
RN notified MD about results of ABG and chest x-ray, new orders received for IV Lasix.  Patient voiced she is feeling better, breathing easier.  RN will continue to monitor.   P.J. Linus Mako, RN

## 2017-07-11 NOTE — Progress Notes (Signed)
CRITICAL VALUE ALERT  Critical Value:  Troponin 0.04  Date & Time Notied:  7/29 1237  Provider Notified: NETTEY  Orders Received/Actions taken: EKG 12 LEAD

## 2017-07-11 NOTE — Plan of Care (Signed)
Problem: Education: Goal: Knowledge of [facility] Education information/materials will improve Outcome: Progressing [name] knowledge of disease process will improve prior to discharge.

## 2017-07-11 NOTE — Progress Notes (Signed)
PROGRESS NOTE    Elizabeth Thompson  ZMO:[id number] DOB: 07-19-1951 DOA: 07/02/2017 PCP: Everardo Beals, NP   Brief Narrative: Elizabeth Thompson is a 66 y.o. that presented with lethargy and hyponatremia. She her hyponatremia is improved but somnolence has continued. She also developed cellulitis from infiltrated IV.   Assessment & Plan:   Principal Problem:   Acute hyponatremia Active Problems:   Hypertension   Chronic respiratory failure with hypoxia, on home O2 therapy (HCC)   COPD (chronic obstructive pulmonary disease) (HCC)   Diabetes mellitus without complication (HCC)   Pulmonary HTN (HCC)   Bipolar disorder (HCC)   Hyperlipidemia   Acute metabolic encephalopathy   CKD (chronic kidney disease) stage 3, GFR 30-59 ml/min  Hyponatremia Improved with cessation of lasix, however, patient had an acute respiratory event overnight. Improved with lasix. No associated symptoms -continue lasix  Acute on chronic respiratory failure Event overnight as mentioned abvoe -continue lasix -EKG/Troponin -repeat CXR: mild improvement in possible edema  Somnolence Improved with discontinuation of Atarax.  Superficial thrombus Etiology of arm warmth, swelling and pain. -warm compresses  CKD stage III Baseline appears to be around 1.8-1.9. Patient has some improvement down to 1.54 earlier in admission. Stable. -fluid intake  COPD Stable -Continue DuoNeb  Diabetes mellitus -Continue SSI -May need to have outpatient metformin discontinued depending on kidney function  Pulmonary hypertension -Continue sildenafil  Bipolar disorder -Continue Abilify  Hyperlipidemia -Continue statin    DVT prophylaxis: Lovenox Code Status: Full code Family Communication: Niece at bedside Disposition Plan: Discharge in likely 24-48 hours if improving.   Consultants:   None  Procedures:   None  Antimicrobials:  Keflex    Subjective: Left arm pain improved. His weight is  significant dyspnea overnight and associated worsening hypoxia. She required Ventimask. Chest x-ray was significant for possible pulmonary edema that responded well to Lasix. She had no associated chest pain, palpitations. She reports no dyspnea or chest pain today.   Objective: Vitals:   07/11/17 0500 07/11/17 0534 07/11/17 0822 07/11/17 0828  BP:  (!) 100/54    Pulse:  82    Resp:  16    Temp:  98.4 F (36.9 C)    TempSrc:      SpO2:  100% 95% 95%  Weight: 91.6 kg (202 lb)     Height:        Intake/Output Summary (Last 24 hours) at 07/11/17 1058 Last data filed at 07/11/17 1017  Gross per 24 hour  Intake              580 ml  Output             1000 ml  Net             -420 ml   Filed Weights   07/06/17 0601 07/07/17 0500 07/11/17 0500  Weight: 93.7 kg (206 lb 8 oz) 95.1 kg (209 lb 9 oz) 91.6 kg (202 lb)    Examination:  General exam: Appears calm and comfortable Respiratory system: Diminished but clear without significant crackles. Cardiovascular system: Regular rate and rhythm. Normal S1 and S2. No heart murmurs present. No extra heart sounds Gastrointestinal system: Soft, non-tender, non-distended, no guarding, no rebound, no masses felt Central nervous system: Alert, oriented.  Extremities: No edema. No calf tenderness Skin: No cyanosis. Warmth over left antecubital fossa. Mild ecchymosis without much erythema. Swollen. Psychiatry: Judgement and insight appear normal. Mood & affect depressed and flat.     Data Reviewed: I have  personally reviewed following labs and imaging studies  CBC:  Recent Labs Lab 07/08/17 0316  WBC 8.0  HGB 9.8*  HCT 31.0*  MCV 76.4*  PLT 754   Basic Metabolic Panel:  Recent Labs Lab 07/07/17 0513 07/08/17 0316 07/09/17 0443 07/10/17 0344 07/11/17 0534  NA 132* 133* 136 133* 136  K 4.7 4.3 4.1 4.1 4.0  CL 101 102 103 101 103  CO2 21* 21* _0 GLUCOSE 110* 100* 87 128* 127*  BUN 21* 23* 24* 31* 34*  CREATININE 1.38*  1.67* 1.82* 1.94* 1.80*  CALCIUM 9.7 9.7 9.7 9.6 9.8   GFR: Estimated Creatinine Clearance: 33.1 mL/min (A) (by C-G formula based on SCr of 1.8 mg/dL (H)). Liver Function Tests:  Recent Labs Lab 07/09/17 0443  AST 26  ALT 18  ALKPHOS 73  BILITOT 0.5  PROT 6.4*  ALBUMIN 2.9*   No results for input(s): LIPASE, AMYLASE in the last 168 hours.  Recent Labs Lab 07/09/17 0443  AMMONIA 16   Coagulation Profile: No results for input(s): INR, PROTIME in the last 168 hours. Cardiac Enzymes: No results for input(s): CKTOTAL, CKMB, CKMBINDEX, TROPONINI in the last 168 hours. BNP (last 3 results)  Recent Labs  05/04/17 1634  PROBNP 160.0*   HbA1C: No results for input(s): HGBA1C in the last 72 hours. CBG:  Recent Labs Lab 07/10/17 0754 07/10/17 1232 07/10/17 1614 07/10/17 2108 07/11/17 0754  GLUCAP 107* 258* 181* 226* 133*   Lipid Profile: No results for input(s): CHOL, HDL, LDLCALC, TRIG, CHOLHDL, LDLDIRECT in the last 72 hours. Thyroid Function Tests: No results for input(s): TSH, T4TOTAL, FREET4, T3FREE, THYROIDAB in the last 72 hours. Anemia Panel: No results for input(s): VITAMINB12, FOLATE, FERRITIN, TIBC, IRON, RETICCTPCT in the last 72 hours. Sepsis Labs: No results for input(s): PROCALCITON, LATICACIDVEN in the last 168 hours.  Recent Results (from the past 240 hour(s))  Culture, blood (Routine X 2) w Reflex to ID Panel     Status: None (Preliminary result)   Collection Time: 07/07/17 10:39 AM  Result Value Ref Range Status   Specimen Description BLOOD RIGHT ARM  Final   Special Requests IN PEDIATRIC BOTTLE Blood Culture adequate volume  Final   Culture NO GROWTH 3 DAYS  Final   Report Status PENDING  Incomplete  Culture, blood (Routine X 2) w Reflex to ID Panel     Status: None (Preliminary result)   Collection Time: 07/07/17 10:39 AM  Result Value Ref Range Status   Specimen Description BLOOD RIGHT HAND  Final   Special Requests IN PEDIATRIC BOTTLE  Blood Culture adequate volume  Final   Culture NO GROWTH 3 DAYS  Final   Report Status PENDING  Incomplete  Urine Culture     Status: Abnormal   Collection Time: 07/09/17  5:09 AM  Result Value Ref Range Status   Specimen Description URINE, RANDOM  Final   Special Requests NONE  Final   Culture <10,000 COLONIES/mL INSIGNIFICANT GROWTH (A)  Final   Report Status 07/10/2017 FINAL  Final         Radiology Studies: Dg Chest Port 1 View  Result Date: 07/11/2017 CLINICAL DATA:  Hypoxia. EXAM: PORTABLE CHEST 1 VIEW COMPARISON:  07/10/2017 FINDINGS: The cardiac silhouette is mildly enlarged. The lungs are mildly hypoinflated. There is a mild diffuse coarse interstitial opacity which overall appears mildly improved, most notably in the lower lungs. No new areas of airspace consolidation, sizeable pleural effusion, or pneumothorax is identified. IMPRESSION: Mild  interval improvement of bilateral interstitial opacities which may reflect decreasing edema superimposed on COPD. Electronically Signed   By: Logan Bores M.D.   On: 07/11/2017 07:59   Dg Chest Port 1 View  Result Date: 07/10/2017 CLINICAL DATA:  Acute respiratory distress. EXAM: PORTABLE CHEST 1 VIEW COMPARISON:  07/02/2017 FINDINGS: The cardiomediastinal silhouette is enlarged. There is no evidence of pneumothorax. Small bilateral pleural effusions cannot be excluded. There has been interval development of diffuse interstitial and alveolar opacities, on the background of chronic emphysema. Osseous structures are without acute abnormality. Soft tissues are grossly normal. IMPRESSION: Interval development of diffuse interstitial and alveolar opacities on the background of chronic emphysema. This may represent development of pulmonary edema versus viral/ atypical respiratory infection. Electronically Signed   By: Fidela Salisbury M.D.   On: 07/10/2017 21:43        Scheduled Meds: . ARIPiprazole  20 mg Oral QHS  . aspirin EC  81 mg  Oral Daily  . atorvastatin  20 mg Oral QPC supper  . cephALEXin  500 mg Oral Q12H  . donepezil  5 mg Oral QHS  . dorzolamide-timolol  1 drop Both Eyes BID  . enoxaparin (LOVENOX) injection  40 mg Subcutaneous Q24H  . feeding supplement  1 Container Oral TID BM  . furosemide  40 mg Oral Daily  . insulin aspart  0-9 Units Subcutaneous TID WC  . lamoTRIgine  100 mg Oral QPC supper  . latanoprost  1 drop Both Eyes QHS  . magnesium oxide  200 mg Oral Daily  . mometasone-formoterol  2 puff Inhalation BID  . sildenafil  20 mg Oral TID  . sodium chloride flush  3 mL Intravenous Q12H   Continuous Infusions:   LOS: 9 days     Cordelia Poche, MD [facility] 07/11/2017, 10:58 AM Pager: [phone]  If 7PM-7AM, please contact night-coverage [email or web address] Password [facility] 07/11/2017, 10:58 AM

## 2017-07-11 NOTE — Progress Notes (Signed)
Call received from RT making RN aware patient's O2 via blood gas was 52. RT reports patient's bicarb level was 22 and recommends patient be placed on ventimask at 52%. O2 applied per RT's recommendations. RN will notify MD.  Arthor Captain. Linus Mako, RN

## 2017-07-12 ENCOUNTER — Inpatient Hospital Stay (HOSPITAL_COMMUNITY): Payer: Medicare Other

## 2017-07-12 LAB — GLUCOSE, CAPILLARY
GLUCOSE-CAPILLARY: 113 mg/dL — AB (ref 65–99)
GLUCOSE-CAPILLARY: 165 mg/dL — AB (ref 65–99)
GLUCOSE-CAPILLARY: 122 mg/dL — AB (ref 65–99)

## 2017-07-12 LAB — BASIC METABOLIC PANEL
ANION GAP: 9 (ref 5–15)
BUN: 30 mg/dL — ABNORMAL HIGH (ref 6–20)
CHLORIDE: 102 mmol/L (ref 101–111)
CO2: 26 mmol/L (ref 22–32)
Calcium: 10.1 mg/dL (ref 8.9–10.3)
Creatinine, Ser: 1.72 mg/dL — ABNORMAL HIGH (ref 0.44–1.00)
GFR calc non Af Amer: 30 mL/min — ABNORMAL LOW (ref >60)
GFR, EST AFRICAN AMERICAN: 35 mL/min — AB (ref >60)
Glucose, Bld: 115 mg/dL — ABNORMAL HIGH (ref 65–99)
POTASSIUM: 4.2 mmol/L (ref 3.5–5.1)
SODIUM: 137 mmol/L (ref 135–145)

## 2017-07-12 LAB — CULTURE, BLOOD (ROUTINE X 2)
CULTURE: NO GROWTH
Culture: NO GROWTH
SPECIAL REQUESTS: ADEQUATE
SPECIAL REQUESTS: ADEQUATE

## 2017-07-12 NOTE — Care Management Important Message (Signed)
Important Message  Patient Details  Name: Elizabeth Thompson MRN: [id number] Date of Birth: May 02, 1951   Medicare Important Message Given:  Yes  Patient signed on 07/09/2017   Orbie Pyo 07/12/2017, 8:53 AM

## 2017-07-12 NOTE — Consult Note (Signed)
   [facility] CM Inpatient Consult   07/12/2017  Elizabeth Thompson 04/17/1951 [id number]   Patient assessed for long length of stay and community care management needs for COPD, DM, CKD stage 3 for disease management.  However, chart review reveals that the patient has a non-[facility] primary care provider, [name]. Patient is not eligible for [facility] Care Management services.  For questions, please contact:  Natividad Brood, RN BSN [facility] Liaison  [phone] business mobile phone Toll free office [phone]

## 2017-07-12 NOTE — Progress Notes (Signed)
Physical Therapy Treatment Patient Details Name: Elizabeth Thompson MRN: [id number] DOB: 1951-11-03 Today's Date: 07/12/2017    History of Present Illness pt is a 66 y/o female with phm significant for COPD on 3L O2, recent dx of sever pulmanary HTN, DM, bipolar d/o, admitted with lethargy for several days.  Found to have acute hyponatremia.  Work up continues.    PT Comments    Patient extremely slow with all aspects of mobility during today's session. Very lethargic and limited by overall fatigue. Performed all activity on 4 liters [place] with saturations stable and HR mid 80s. Patient reluctant to mobilize but agreeable to in room mobility and use of commode. Max multimodal cues to maintain engagement in session. Assist for all aspects of mobility. Will continue to see and progress as tolerated.    Follow Up Recommendations  Home health PT;Supervision/Assistance - 24 hour;Other (comment) (work up to going to [employer])     Equipment Recommendations  None recommended by PT    Recommendations for Other Services       Precautions / Restrictions Precautions Precautions: Fall Precaution Comments: watch O2 saturations Restrictions Weight Bearing Restrictions: No    Mobility  Bed Mobility Overal bed mobility: Needs Assistance Bed Mobility: Supine to Sit;Sit to Supine     Supine to sit: Min assist Sit to supine: Min assist   General bed mobility comments: min assist to rotate trunk and elevate to upright, increased time and effort to perform. assist to elevate LEs back to bed  Transfers Overall transfer level: Needs assistance   Transfers: Sit to/from Stand Sit to Stand: Min assist         General transfer comment: min assist to power to upright. Increased effort to perform. assist for stability and VCs for upright posture  Ambulation/Gait Ambulation/Gait assistance: Min guard;Min assist Ambulation Distance (Feet): 20 Feet Assistive device: 1 person hand held assist Gait  Pattern/deviations: Step-through pattern;Shuffle;Decreased stride length Gait velocity: decreased Gait velocity interpretation: Below normal speed for age/gender General Gait Details: patient limited by fatigue, ambulated in room to commode and back to bed. Assist for stability during ambulation   Stairs            Wheelchair Mobility    Modified Rankin (Stroke Patients Only)       Balance Overall balance assessment: Needs assistance Sitting-balance support: Bilateral upper extremity supported;Feet supported Sitting balance-Leahy Scale: Poor Sitting balance - Comments: left lateral lean in sitting with Mod A to maintain sitting upright at EOB and cues to look outside to keep head upright Postural control: Right lateral lean Standing balance support: Bilateral upper extremity supported Standing balance-Leahy Scale: Poor Standing balance comment: continues to rely on UE support                            Cognition Arousal/Alertness: Lethargic Behavior During Therapy: Flat affect Overall Cognitive Status: Within Functional Limits for tasks assessed                                        Exercises      General Comments General comments (skin integrity, edema, etc.): educated patient on energy conservation and pursed lip breathing. multiple cues to perform technique      Pertinent Vitals/Pain Pain Assessment: No/denies pain    Home Living  Prior Function            PT Goals (current goals can now be found in the care plan section) Acute Rehab PT Goals Patient Stated Goal: I want to go home PT Goal Formulation: With patient Time For Goal Achievement: 07/19/17 Potential to Achieve Goals: Good Progress towards PT goals: Not progressing toward goals - comment (limtied by fatigue)    Frequency    Min 3X/week      PT Plan Current plan remains appropriate    Co-evaluation              AM-PAC  PT "6 Clicks" Daily Activity  Outcome Measure  Difficulty turning over in bed (including adjusting bedclothes, sheets and blankets)?: Total Difficulty moving from lying on back to sitting on the side of the bed? : Total Difficulty sitting down on and standing up from a chair with arms (e.g., wheelchair, bedside commode, etc,.)?: Total Help needed moving to and from a bed to chair (including a wheelchair)?: A Lot Help needed walking in hospital room?: Total Help needed climbing 3-5 steps with a railing? : Total 6 Click Score: 7    End of Session Equipment Utilized During Treatment: Gait belt;Oxygen Activity Tolerance: Patient limited by lethargy Patient left: in bed;with call bell/phone within reach;with bed alarm set Nurse Communication: Mobility status PT Visit Diagnosis: Unsteadiness on feet (R26.81);Muscle weakness (generalized) (M62.81);Other abnormalities of gait and mobility (R26.89)     Time: 8882-8003 PT Time Calculation (min) (ACUTE ONLY): 26 min  Charges:  $Therapeutic Activity: 23-37 mins                    G Codes:       Alben Deeds, PT DPT  Board Certified Neurologic Specialist [phone]    Duncan Dull 07/12/2017, 4:49 PM

## 2017-07-12 NOTE — Plan of Care (Signed)
Problem: Education: Goal: Knowledge of [facility] Education information/materials will improve Outcome: Progressing [name] knowledge on disease process will improve prior to discharge.  Problem: Activity: Goal: Risk for activity intolerance will decrease Outcome: Progressing Pt will be free from SOB during periods of activity.

## 2017-07-12 NOTE — Discharge Summary (Signed)
Physician Discharge Summary  Elizabeth Thompson HRC:[id number] DOB: 1951/10/17 DOA: 07/02/2017  PCP: Everardo Beals, NP  Admit date: 07/02/2017 Discharge date: 07/13/2017  Admitted From: Home Disposition: Home  Recommendations for Outpatient Follow-up:  1. Follow up with PCP in 1 week 2. Please obtain BMP/CBC in three days 3. Please follow up on the following pending results: None  Home Health: Physical therapy Equipment/Devices: None  Discharge Condition: Stable CODE STATUS: Full code Diet recommendation: Heart healthy, fluid restriction   Brief/Interim Summary:  Admission HPI written by Velvet Bathe, MD   Chief Complaint: Lethargy-abnormal/low sodium obtained at outside facility  HPI: Elizabeth Thompson is a 66 y.o. female with medical history significant for COPD on chronic nasal cannula oxygen at 3 L/m, new diagnosis of severe pulmonary hypertension during most recent hospitalization with new start of BID Lasix and sildenafil, known diabetes on Amaryl and metformin, bipolar disorder on chronic psychiatric medications, and dyslipidemia. The patient was recently hospitalized with heart failure symptoms. She underwent right heart catheterization by Dr. Doylene Canard and was found to have severe pulmonary hypertension with subsequent started on Lasix 40 mg by mouth twice a day as well as sildenafil. She was discharged on 7/2 and at that time her sodium was 141, BUN 34 and creatinine 1.74. Her metformin was briefly held secondary to receipt of recent IV contrast and the cause of her chronic kidney disease her Amaryl doses which decreased from formula grams 2 mg during the hospitalization. Since discharge patient has followed up with PCP where apparently on 7/11 she was told that her sodium level was "low". No adjustments in medications made. She presented today to [facility] complaining of generalized weakness. Subsequent lab data revealed a significant decrease in sodium to 119  with normal renal function for this patient. Patient's chest x-ray was stable with persistent mild pulmonary interstitial congestion. In addition to low-sodium patient was more lethargic than baseline in complaining of some mild nausea.  ED Course:  Vital Signs: BP 106/81   Pulse 94   Temp 98.5 F (36.9 C) (Oral)   Resp 18   Ht _0  (1.6 m)   Wt 90.7 kg (200 lb)   SpO2 98%   BMI 35.43 kg/m  2 view CXR: As above Lab data: Sodium 119, potassium 4.1, chloride 88, CO2 21, glucose 71, BUN 20, creatinine 1.98, anion gap 10, calcium 9.8, POC troponin 0.04, lactic acid 1.16, white count 4700 with normal differential, hemoglobin 1.1, platelets 382,000 Medications and treatments: None   Hospital course:  Hyponatremia Fluid restriction and lasix improved sodium. Continue lasix at reduced dose. Continued fluid restriction. Repeat BMP within 3 days  Acute on chronic respiratory failure Secondary to pulmonary edema in the setting of chronic respiratory failure. Given IV lasix with improvement. Attempted to obtain an echocardiogram prior to discharge, but unable. Please obtain as an outpatient.  Somnolence Improved with discontinuation of Atarax.  Superficial thrombus Etiology of arm warmth, swelling and pain. Warm compresses. Keflex initially started for concern of cellulitis which was discontinued.  CKD stage III Baseline appears to be around 1.8-1.9. Patient has some improvement down to 1.54 earlier in admission. Stable. -fluid intake  COPD Stable. Continued inhalers.  Diabetes mellitus Sliding scale insulin while inpatient. Continue metformin on discharge in addition to glimepiride. Consider discontinuing metformin should her GFR reduce below 30 mL/min. Metformin reduced to 1035m daily.  Pulmonary hypertension Continued sildenafil.  Bipolar disorder Continue Abilify.  Hyperlipidemia Continue statin.  Discharge Diagnoses:  Principal Problem:  Acute  hyponatremia Active Problems:   Hypertension   Chronic respiratory failure with hypoxia, on home O2 therapy (HCC)   COPD (chronic obstructive pulmonary disease) (HCC)   Diabetes mellitus without complication (HCC)   Pulmonary HTN (HCC)   Bipolar disorder (HCC)   Hyperlipidemia   Acute metabolic encephalopathy   CKD (chronic kidney disease) stage 3, GFR 30-59 ml/min    Discharge Instructions  Discharge Instructions    Call MD for:  difficulty breathing, headache or visual disturbances    Complete by:  As directed    Call MD for:  persistant dizziness or light-headedness    Complete by:  As directed    Call MD for:  persistant nausea and vomiting    Complete by:  As directed    Diet - low sodium heart healthy    Complete by:  As directed    Increase activity slowly    Complete by:  As directed      Allergies as of 07/13/2017      Reactions   Codeine Nausea And Vomiting   Nitrostat [nitroglycerin] Other (See Comments)   Pt is on revatio   Prednisone Other (See Comments)   Has to monitor because she is a diabetic       Medication List    STOP taking these medications   hydrOXYzine 25 MG tablet Commonly known as:  ATARAX/VISTARIL   mirtazapine 7.5 MG tablet Commonly known as:  REMERON     TAKE these medications   acetaminophen 650 MG CR tablet Commonly known as:  TYLENOL Take 1,300 mg by mouth every 12 (twelve) hours as needed for pain.   albuterol (2.5 MG/3ML) 0.083% nebulizer solution Commonly known as:  PROVENTIL Take 2.5 mg by nebulization every 4 (four) hours as needed for wheezing or shortness of breath. PLAN C   PROAIR HFA 108 (90 Base) MCG/ACT inhaler Generic drug:  albuterol Inhale 2 puffs into the lungs every 6 (six) hours as needed for wheezing or shortness of breath.   ARIPiprazole 20 MG tablet Commonly known as:  ABILIFY Take 1 tablet (20 mg total) by mouth at bedtime.   aspirin 81 MG EC tablet Take 1 tablet (81 mg total) by mouth daily.    atorvastatin 20 MG tablet Commonly known as:  LIPITOR Take 20 mg by mouth daily after supper.   bisacodyl 10 MG suppository Commonly known as:  DULCOLAX Place 1 suppository (10 mg total) rectally daily as needed for moderate constipation. What changed:  reasons to take this   budesonide-formoterol 160-4.5 MCG/ACT inhaler Commonly known as:  SYMBICORT Inhale 2 puffs into the lungs 2 (two) times daily.   COMBIVENT RESPIMAT 20-100 MCG/ACT Aers respimat Generic drug:  Ipratropium-Albuterol Inhale 1 puff into the lungs every 4 (four) hours as needed for wheezing. PLAN B   COQ10 PO Take 1 capsule by mouth See admin instructions. Take 1 capsule by mouth every other day after supper (with atorvastatin)   donepezil 5 MG tablet Commonly known as:  ARICEPT Take 1 tablet (5 mg total) by mouth at bedtime.   dorzolamide-timolol 22.3-6.8 MG/ML ophthalmic solution Commonly known as:  COSOPT Place 1 drop into both eyes 2 (two) times daily.   feeding supplement Liqd Take 1 Container by mouth 3 (three) times daily between meals.   ferrous sulfate 325 (65 FE) MG tablet Take 325 mg by mouth daily after supper.   FLUTTER Devi Use as directed.   furosemide 40 MG tablet Commonly known as:  LASIX Take  1 tablet (40 mg total) by mouth daily. What changed:  when to take this   glimepiride 4 MG tablet Commonly known as:  AMARYL Take 0.5 tablets (2 mg total) by mouth daily with breakfast.   glucose blood test strip Commonly known as:  ACCU-CHEK AVIVA 1 each by Other route 2 (two) times daily.   lactulose 10 GM/15ML solution Commonly known as:  CHRONULAC Take 30 mLs (20 g total) by mouth 2 (two) times daily as needed for mild constipation.   lamoTRIgine 100 MG tablet Commonly known as:  LAMICTAL Take 1 tablet (100 mg total) by mouth every evening. What changed:  when to take this   latanoprost 0.005 % ophthalmic solution Commonly known as:  XALATAN Place 1 drop into both eyes at  bedtime.   Magnesium 250 MG Tabs Take 250 mg by mouth See admin instructions. Take 1 tablet (250 mg) by mouth every other day after supper   metFORMIN 1000 MG tablet Commonly known as:  GLUCOPHAGE Take 1 tablet (1,000 mg total) by mouth daily with breakfast. What changed:  when to take this   [place] 1200 MG Tb12 Generic drug:  Guaifenesin Take 1,200 mg by mouth 2 (two) times daily.   OXYGEN Inhale 3-4 L into the lungs See admin instructions. 3 liters continuously and 4 liters if patient is ambulatory   polyethylene glycol packet Commonly known as:  MIRALAX / GLYCOLAX Take 17 g by mouth daily. What changed:  when to take this  reasons to take this  additional instructions   Selenium Sulf-Pyrithione-Urea 2.25 % Sham Apply 1 application topically See admin instructions. Apply topically to scalp for dermatitis once every 14 days   senna 8.6 MG tablet Commonly known as:  SENOKOT Take 1 tablet by mouth daily as needed for constipation.   sildenafil 20 MG tablet Commonly known as:  REVATIO Take 1 tablet (20 mg total) by mouth 3 (three) times daily.      Follow-up Information    Home, [facility] Follow up.   Specialty:  [place] Why:  home health services arranged, office will call and set up home visits Contact information: [place] [address] [phone]        Everardo Beals, NP. Schedule an appointment as soon as possible for a visit in 1 week(s).   Contact information: [facility] [address] [place] [address] [phone]          Allergies  Allergen Reactions  . Codeine Nausea And Vomiting  . Nitrostat [Nitroglycerin] Other (See Comments)    Pt is on revatio  . Prednisone Other (See Comments)    Has to monitor because she is a diabetic     Consultations:  None   Procedures/Studies: Dg Chest 2 View  Result Date: 07/02/2017 CLINICAL DATA:  Initial  evaluation for acute weakness. EXAM: CHEST  2 VIEW COMPARISON:  Prior radiograph from 06/10/2017. FINDINGS: Stable cardiomegaly.  Mediastinal silhouette within normal limits. Lungs mildly hypoinflated. Underlying COPD. Superimposed mild interstitial prominence suspected to reflect interstitial congestion, similar to previous. No focal infiltrates. No overt alveolar edema or pleural effusion. No pneumothorax. No acute osseus abnormality. IMPRESSION: 1. Stable cardiomegaly with mild diffuse interstitial prominence, favored to reflect mild pulmonary interstitial congestion. 2. Underlying COPD. Electronically Signed   By: Jeannine Boga M.D.   On: 07/02/2017 15:23   Dg Chest Port 1 View  Result Date: 07/11/2017 CLINICAL DATA:  Hypoxia. EXAM:  PORTABLE CHEST 1 VIEW COMPARISON:  07/10/2017 FINDINGS: The cardiac silhouette is mildly enlarged. The lungs are mildly hypoinflated. There is a mild diffuse coarse interstitial opacity which overall appears mildly improved, most notably in the lower lungs. No new areas of airspace consolidation, sizeable pleural effusion, or pneumothorax is identified. IMPRESSION: Mild interval improvement of bilateral interstitial opacities which may reflect decreasing edema superimposed on COPD. Electronically Signed   By: Logan Bores M.D.   On: 07/11/2017 07:59   Dg Chest Port 1 View  Result Date: 07/10/2017 CLINICAL DATA:  Acute respiratory distress. EXAM: PORTABLE CHEST 1 VIEW COMPARISON:  07/02/2017 FINDINGS: The cardiomediastinal silhouette is enlarged. There is no evidence of pneumothorax. Small bilateral pleural effusions cannot be excluded. There has been interval development of diffuse interstitial and alveolar opacities, on the background of chronic emphysema. Osseous structures are without acute abnormality. Soft tissues are grossly normal. IMPRESSION: Interval development of diffuse interstitial and alveolar opacities on the background of chronic emphysema. This may  represent development of pulmonary edema versus viral/ atypical respiratory infection. Electronically Signed   By: Fidela Salisbury M.D.   On: 07/10/2017 21:43     Subjective: No dyspnea or chest pain. Some left arm tenderness.  Discharge Exam: Vitals:   07/13/17 0656 07/13/17 1029  BP: 116/60 121/77  Pulse: 85 81  Resp: 18   Temp:     Vitals:   07/12/17 2335 07/13/17 0656 07/13/17 0801 07/13/17 1029  BP: 109/66 116/60  121/77  Pulse: 85 85  81  Resp: 18 18    Temp: 97.7 F (36.5 C)     TempSrc: Oral     SpO2: 95% 99% 99%   Weight:      Height:        General exam: Appears calm and comfortable Respiratory system: mild crackles bilaterally. Cardiovascular system: Regular rate and rhythm. Normal S1 and S2. No heart murmurs present. No extra heart sounds. 2+ left radial pulses. Gastrointestinal system: Soft, non-tender, non-distended, no guarding, no rebound, no masses felt Central nervous system: Alert, oriented.  Extremities: No edema. No calf tenderness. No arm swelling Skin: left antecubital area with induration and mild warmth/tenderness. Psychiatry: Judgement and insight appear normal. Mood & affect depressed and flat.    The results of significant diagnostics from this hospitalization (including imaging, microbiology, ancillary and laboratory) are listed below for reference.     Microbiology: Recent Results (from the past 240 hour(s))  Culture, blood (Routine X 2) w Reflex to ID Panel     Status: None   Collection Time: 07/07/17 10:39 AM  Result Value Ref Range Status   Specimen Description BLOOD RIGHT ARM  Final   Special Requests IN PEDIATRIC BOTTLE Blood Culture adequate volume  Final   Culture NO GROWTH 5 DAYS  Final   Report Status 07/12/2017 FINAL  Final  Culture, blood (Routine X 2) w Reflex to ID Panel     Status: None   Collection Time: 07/07/17 10:39 AM  Result Value Ref Range Status   Specimen Description BLOOD RIGHT HAND  Final   Special  Requests IN PEDIATRIC BOTTLE Blood Culture adequate volume  Final   Culture NO GROWTH 5 DAYS  Final   Report Status 07/12/2017 FINAL  Final  Urine Culture     Status: Abnormal   Collection Time: 07/09/17  5:09 AM  Result Value Ref Range Status   Specimen Description URINE, RANDOM  Final   Special Requests NONE  Final   Culture <10,000 COLONIES/mL INSIGNIFICANT  GROWTH (A)  Final   Report Status 07/10/2017 FINAL  Final     Labs: BNP (last 3 results)  Recent Labs  06/09/17 2358 06/11/17 0425 06/12/17 0251  BNP 1,001.6* 478.5* 818.2*   Basic Metabolic Panel:  Recent Labs Lab 07/08/17 0316 07/09/17 0443 07/10/17 0344 07/11/17 0534 07/12/17 0810  NA 133* 136 133* 136 137  K 4.3 4.1 4.1 4.0 4.2  CL 102 103 101 103 102  CO2 21* _0 GLUCOSE 100* 87 128* 127* 115*  BUN 23* 24* 31* 34* 30*  CREATININE 1.67* 1.82* 1.94* 1.80* 1.72*  CALCIUM 9.7 9.7 9.6 9.8 10.1   Liver Function Tests:  Recent Labs Lab 07/09/17 0443  AST 26  ALT 18  ALKPHOS 73  BILITOT 0.5  PROT 6.4*  ALBUMIN 2.9*   No results for input(s): LIPASE, AMYLASE in the last 168 hours.  Recent Labs Lab 07/09/17 0443  AMMONIA 16   CBC:  Recent Labs Lab 07/08/17 0316  WBC 8.0  HGB 9.8*  HCT 31.0*  MCV 76.4*  PLT 359   Cardiac Enzymes:  Recent Labs Lab 07/11/17 1109  TROPONINI 0.04*   BNP: Invalid input(s): POCBNP CBG:  Recent Labs Lab 07/12/17 1218 07/12/17 1620 07/13/17 0013 07/13/17 0821 07/13/17 1158  GLUCAP 165* 122* 124* 201* 166*   D-Dimer No results for input(s): DDIMER in the last 72 hours. Hgb A1c No results for input(s): HGBA1C in the last 72 hours. Lipid Profile No results for input(s): CHOL, HDL, LDLCALC, TRIG, CHOLHDL, LDLDIRECT in the last 72 hours. Thyroid function studies No results for input(s): TSH, T4TOTAL, T3FREE, THYROIDAB in the last 72 hours.  Invalid input(s): FREET3 Anemia work up No results for input(s): VITAMINB12, FOLATE, FERRITIN,  TIBC, IRON, RETICCTPCT in the last 72 hours. Urinalysis    Component Value Date/Time   COLORURINE YELLOW 07/02/2017 1825   APPEARANCEUR CLEAR 07/02/2017 1825   LABSPEC 1.006 07/02/2017 1825   PHURINE 5.0 07/02/2017 1825   GLUCOSEU NEGATIVE 07/02/2017 1825   HGBUR NEGATIVE 07/02/2017 1825   BILIRUBINUR NEGATIVE 07/02/2017 1825   KETONESUR NEGATIVE 07/02/2017 1825   PROTEINUR 30 (A) 07/02/2017 1825   UROBILINOGEN 0.2 08/17/2015 0109   NITRITE NEGATIVE 07/02/2017 1825   LEUKOCYTESUR NEGATIVE 07/02/2017 1825   Sepsis Labs Invalid input(s): PROCALCITONIN,  WBC,  LACTICIDVEN Microbiology Recent Results (from the past 240 hour(s))  Culture, blood (Routine X 2) w Reflex to ID Panel     Status: None   Collection Time: 07/07/17 10:39 AM  Result Value Ref Range Status   Specimen Description BLOOD RIGHT ARM  Final   Special Requests IN PEDIATRIC BOTTLE Blood Culture adequate volume  Final   Culture NO GROWTH 5 DAYS  Final   Report Status 07/12/2017 FINAL  Final  Culture, blood (Routine X 2) w Reflex to ID Panel     Status: None   Collection Time: 07/07/17 10:39 AM  Result Value Ref Range Status   Specimen Description BLOOD RIGHT HAND  Final   Special Requests IN PEDIATRIC BOTTLE Blood Culture adequate volume  Final   Culture NO GROWTH 5 DAYS  Final   Report Status 07/12/2017 FINAL  Final  Urine Culture     Status: Abnormal   Collection Time: 07/09/17  5:09 AM  Result Value Ref Range Status   Specimen Description URINE, RANDOM  Final   Special Requests NONE  Final   Culture <10,000 COLONIES/mL INSIGNIFICANT GROWTH (A)  Final   Report Status 07/10/2017 FINAL  Final     Time coordinating discharge: Over 30 minutes  SIGNED:   Cordelia Poche, MD [facility] 07/13/2017, 1:17 PM Pager [phone]  If 7PM-7AM, please contact night-coverage [email or web address] Password [email or web address]

## 2017-07-12 NOTE — Progress Notes (Signed)
PROGRESS NOTE    Elizabeth Thompson  XYB:[id number] DOB: 1951-03-11 DOA: 07/02/2017 PCP: Everardo Beals, NP   Brief Narrative: Elizabeth Thompson is a 66 y.o. that presented with lethargy and hyponatremia. She her hyponatremia is improved but somnolence has continued. She also developed cellulitis from infiltrated IV.   Assessment & Plan:   Principal Problem:   Acute hyponatremia Active Problems:   Hypertension   Chronic respiratory failure with hypoxia, on home O2 therapy (HCC)   COPD (chronic obstructive pulmonary disease) (HCC)   Diabetes mellitus without complication (HCC)   Pulmonary HTN (HCC)   Bipolar disorder (HCC)   Hyperlipidemia   Acute metabolic encephalopathy   CKD (chronic kidney disease) stage 3, GFR 30-59 ml/min  Hyponatremia Improved with cessation of lasix, however, patient had an acute respiratory event overnight. Improved with lasix. No associated symptoms -continue lasix  Acute on chronic respiratory failure Event overnight as mentioned above -continue lasix -repeat CXR: mild improvement in possible edema -echocardiogram pending  Somnolence Improved with discontinuation of Atarax.  Superficial thrombus Etiology of arm warmth, swelling and pain. -warm compresses  CKD stage III Baseline appears to be around 1.8-1.9. Patient has some improvement down to 1.54 earlier in admission. Stable. -fluid intake  COPD Stable -Continue DuoNeb  Diabetes mellitus -Continue SSI -May need to have outpatient metformin discontinued depending on kidney function  Pulmonary hypertension -Continue sildenafil  Bipolar disorder -Continue Abilify  Hyperlipidemia -Continue statin    DVT prophylaxis: Lovenox Code Status: Full code Family Communication: Niece at bedside Disposition Plan: Discharge in 24 hours; awaiting echocardiogram   Consultants:   None  Procedures:   None  Antimicrobials:  Keflex    Subjective: No events  overnight  Objective: Vitals:   07/12/17 0500 07/12/17 0530 07/12/17 1013 07/12/17 1340  BP:  134/62  132/67  Pulse:  92  92  Resp:  20  20  Temp:  97.6 F (36.4 C)  98.2 F (36.8 C)  TempSrc:  Oral  Oral  SpO2:  92% 93% 93%  Weight: 91 kg (200 lb 9.9 oz)     Height:        Intake/Output Summary (Last 24 hours) at 07/12/17 1617 Last data filed at 07/12/17 1552  Gross per 24 hour  Intake              480 ml  Output             1780 ml  Net            -1300 ml   Filed Weights   07/07/17 0500 07/11/17 0500 07/12/17 0500  Weight: 95.1 kg (209 lb 9 oz) 91.6 kg (202 lb) 91 kg (200 lb 9.9 oz)    Examination:  General exam: Appears calm and comfortable Respiratory system: mild crackles bilaterally. Cardiovascular system: Regular rate and rhythm. Normal S1 and S2. No heart murmurs present. No extra heart sounds Gastrointestinal system: Soft, non-tender, non-distended, no guarding, no rebound, no masses felt Central nervous system: Alert, oriented.  Extremities: No edema. No calf tenderness Psychiatry: Judgement and insight appear normal. Mood & affect depressed and flat.     Data Reviewed: I have personally reviewed following labs and imaging studies  CBC:  Recent Labs Lab 07/08/17 0316  WBC 8.0  HGB 9.8*  HCT 31.0*  MCV 76.4*  PLT 660   Basic Metabolic Panel:  Recent Labs Lab 07/08/17 0316 07/09/17 0443 07/10/17 0344 07/11/17 0534 07/12/17 0810  NA 133* 136 133* 136 137  K 4.3 4.1 4.1 4.0 4.2  CL 102 103 101 103 102  CO2 21* _0 GLUCOSE 100* 87 128* 127* 115*  BUN 23* 24* 31* 34* 30*  CREATININE 1.67* 1.82* 1.94* 1.80* 1.72*  CALCIUM 9.7 9.7 9.6 9.8 10.1   GFR: Estimated Creatinine Clearance: 34.4 mL/min (A) (by C-G formula based on SCr of 1.72 mg/dL (H)). Liver Function Tests:  Recent Labs Lab 07/09/17 0443  AST 26  ALT 18  ALKPHOS 73  BILITOT 0.5  PROT 6.4*  ALBUMIN 2.9*   No results for input(s): LIPASE, AMYLASE in the last 168  hours.  Recent Labs Lab 07/09/17 0443  AMMONIA 16   Coagulation Profile: No results for input(s): INR, PROTIME in the last 168 hours. Cardiac Enzymes:  Recent Labs Lab 07/11/17 1109  TROPONINI 0.04*   BNP (last 3 results)  Recent Labs  05/04/17 1634  PROBNP 160.0*   HbA1C: No results for input(s): HGBA1C in the last 72 hours. CBG:  Recent Labs Lab 07/11/17 1220 07/11/17 1621 07/11/17 2117 07/12/17 0753 07/12/17 1218  GLUCAP 275* 168* 201* 113* 165*   Lipid Profile: No results for input(s): CHOL, HDL, LDLCALC, TRIG, CHOLHDL, LDLDIRECT in the last 72 hours. Thyroid Function Tests: No results for input(s): TSH, T4TOTAL, FREET4, T3FREE, THYROIDAB in the last 72 hours. Anemia Panel: No results for input(s): VITAMINB12, FOLATE, FERRITIN, TIBC, IRON, RETICCTPCT in the last 72 hours. Sepsis Labs: No results for input(s): PROCALCITON, LATICACIDVEN in the last 168 hours.  Recent Results (from the past 240 hour(s))  Culture, blood (Routine X 2) w Reflex to ID Panel     Status: None   Collection Time: 07/07/17 10:39 AM  Result Value Ref Range Status   Specimen Description BLOOD RIGHT ARM  Final   Special Requests IN PEDIATRIC BOTTLE Blood Culture adequate volume  Final   Culture NO GROWTH 5 DAYS  Final   Report Status 07/12/2017 FINAL  Final  Culture, blood (Routine X 2) w Reflex to ID Panel     Status: None   Collection Time: 07/07/17 10:39 AM  Result Value Ref Range Status   Specimen Description BLOOD RIGHT HAND  Final   Special Requests IN PEDIATRIC BOTTLE Blood Culture adequate volume  Final   Culture NO GROWTH 5 DAYS  Final   Report Status 07/12/2017 FINAL  Final  Urine Culture     Status: Abnormal   Collection Time: 07/09/17  5:09 AM  Result Value Ref Range Status   Specimen Description URINE, RANDOM  Final   Special Requests NONE  Final   Culture <10,000 COLONIES/mL INSIGNIFICANT GROWTH (A)  Final   Report Status 07/10/2017 FINAL  Final          Radiology Studies: Dg Chest Port 1 View  Result Date: 07/11/2017 CLINICAL DATA:  Hypoxia. EXAM: PORTABLE CHEST 1 VIEW COMPARISON:  07/10/2017 FINDINGS: The cardiac silhouette is mildly enlarged. The lungs are mildly hypoinflated. There is a mild diffuse coarse interstitial opacity which overall appears mildly improved, most notably in the lower lungs. No new areas of airspace consolidation, sizeable pleural effusion, or pneumothorax is identified. IMPRESSION: Mild interval improvement of bilateral interstitial opacities which may reflect decreasing edema superimposed on COPD. Electronically Signed   By: Logan Bores M.D.   On: 07/11/2017 07:59   Dg Chest Port 1 View  Result Date: 07/10/2017 CLINICAL DATA:  Acute respiratory distress. EXAM: PORTABLE CHEST 1 VIEW COMPARISON:  07/02/2017 FINDINGS: The cardiomediastinal silhouette is enlarged. There is  no evidence of pneumothorax. Small bilateral pleural effusions cannot be excluded. There has been interval development of diffuse interstitial and alveolar opacities, on the background of chronic emphysema. Osseous structures are without acute abnormality. Soft tissues are grossly normal. IMPRESSION: Interval development of diffuse interstitial and alveolar opacities on the background of chronic emphysema. This may represent development of pulmonary edema versus viral/ atypical respiratory infection. Electronically Signed   By: Fidela Salisbury M.D.   On: 07/10/2017 21:43        Scheduled Meds: . ARIPiprazole  20 mg Oral QHS  . aspirin EC  81 mg Oral Daily  . atorvastatin  20 mg Oral QPC supper  . donepezil  5 mg Oral QHS  . dorzolamide-timolol  1 drop Both Eyes BID  . enoxaparin (LOVENOX) injection  40 mg Subcutaneous Q24H  . feeding supplement  1 Container Oral TID BM  . furosemide  40 mg Oral Daily  . insulin aspart  0-9 Units Subcutaneous TID WC  . lamoTRIgine  100 mg Oral QPC supper  . latanoprost  1 drop Both Eyes QHS  .  magnesium oxide  200 mg Oral Daily  . mometasone-formoterol  2 puff Inhalation BID  . sildenafil  20 mg Oral TID  . sodium chloride flush  3 mL Intravenous Q12H   Continuous Infusions:   LOS: 10 days     Cordelia Poche, MD [facility] 07/12/2017, 4:17 PM Pager: [phone]  If 7PM-7AM, please contact night-coverage [email or web address] Password [email or web address] 07/12/2017, 4:17 PM

## 2017-07-13 ENCOUNTER — Inpatient Hospital Stay (HOSPITAL_COMMUNITY): Payer: Medicare Other

## 2017-07-13 ENCOUNTER — Other Ambulatory Visit (HOSPITAL_COMMUNITY): Payer: Self-pay

## 2017-07-13 LAB — GLUCOSE, CAPILLARY
Glucose-Capillary: 124 mg/dL — ABNORMAL HIGH (ref 65–99)
Glucose-Capillary: 166 mg/dL — ABNORMAL HIGH (ref 65–99)
Glucose-Capillary: 201 mg/dL — ABNORMAL HIGH (ref 65–99)

## 2017-07-13 MED ORDER — BOOST / RESOURCE BREEZE PO LIQD: 1.0000 | Freq: Three times a day (TID) | ORAL | 0 refills | Status: DC

## 2017-07-13 MED ORDER — FUROSEMIDE 40 MG PO TABS: 40.0000 mg | ORAL_TABLET | Freq: Every day | ORAL | 0 refills | Status: DC

## 2017-07-13 MED ORDER — METFORMIN HCL 1000 MG PO TABS: 1000.0000 mg | ORAL_TABLET | Freq: Every day | ORAL | Status: DC

## 2017-07-13 NOTE — Progress Notes (Signed)
  Echocardiogram 2D Echocardiogram has been performed.  Johny Chess 07/13/2017, 2:29 PM

## 2017-07-13 NOTE — Discharge Instructions (Addendum)
You were admitted with somnolence, hyponatremia and trouble breathing. We managed this with restricting your fluid intake. Your oxygen was weaned down to your baseline as tolerated. You have some medication changes. Please refer carefully to your medication list. Please follow-up with your primary care physician.

## 2017-07-18 NOTE — H&P (Signed)
OFFICE VISIT NOTES COPIED TO EPIC FOR DOCUMENTATION  . History of Present Illness Elizabeth Page MD; 07-22-17 10:11 PM) Patient words: Last OV 01/11/2017; FU 10 day for heart failure; pulm htn, carotid duplex.  The patient is a 66 year old female who presents for a follow-up for Dyspnea. Patient has history of left superior pole renal cell carcinoma cryoablation on July 2017. On 10/01/2016, a new and separate left upperlobe tumor was identified and underwent successful repeat Cryoablation.  She has COPD, uses home oxygen, diabetes mellitus, history of hyperlipidemia, hypertension, GERD. She reports that her diabetes has usually always been well controlled. She denies any chest pain, she is a former smoker quit 1-1/2 years ago and has 40 pack year history of smoking.  She was admitted to the hospital on 06/09/22 minutes severe hypoxemia, acute on chronic diastolic heart failure see a pulmonary hypertension, COPD and acute renal failure probably related to cardiac catheterization and also aggressive diuresis that occurred during the hospital admission. She now presents here for follow-up, admits tried made significant lifestyle changes and has been watching her diet including salt restriction. She continues to have exertional dyspnea but overall has started to feel better. Her niece is present at the bedside.   Problem List/Past Medical Anderson Malta Sergeant; 07/22/17 9:08 AM) GERD (gastroesophageal reflux disease) (K21.9)  Dyspnea on exertion (R06.09)  Lexiscan myoview stress test 12/25/2016: 1. The resting electrocardiogram demonstrated normal sinus rhythm, normal resting conduction, no resting arrhythmias and normal rest repolarization. Stress EKG is non-diagnostic for ischemia as it a pharmacologic stress using Lexiscan. Stress symptoms included dyspnea. 2. The perfusion imaging study demonstrates breast attenuation artifact in the inferior wall and apical thinning but no demonstrable  ischemia or scar. There is normal wall motion in all vascular territories, LVEF was calculated at 61%. This represents a low risk study. Echocardiogram 09/06/2016: Normal LV systolic function, EF 32-12%. Grade 2 diastolic dysfunction. Moderate aortic valve ossification without stenosis. Mild tricuspid regurgitation. Normal PA pressure. Adenocarcinoma of kidney, left (C64.2)  Hypertension, essential, benign (I10)  Controlled type 2 diabetes mellitus with stage 3 chronic kidney disease, without long-term current use of insulin (E11.22)  COPD with hypoxia (J44.9)  Follows Dr. Melvyn Novas. CT scan of the chest 11/10/2016: Increased size of axillary lymphadenopathy on the right, decreased the size of previously described mediastinal lymphadenopathy, central emphysema, aortic atherosclerosis. Labwork  Labs 06/26/2016: Potassium 4.7, BUN 23, serum glucose 126 minute grams, creatinine 1.6. EGFR 33 mL. CMP otherwise normal. Vitamin D 41, TSH normal, HbA1c 6.0%. Total cholesterol 21 and 16, triglycerides 144, HDL 65, LDL 122. HB 10.9/HCT 34.4, normal indicis. Platelets 322. Hyperlipidemia, group A (E78.00)  Preoperative cardiovascular examination (Z01.810)  Cryoablation of the left kidney mass 02/05/17 by IR- Dr Jacqualyn Posey Bilateral carotid bruits (R09.89)  Carotid artery duplex 01/27/2017: Minimal stenosis in the left internal carotid artery a Antegrade right vertebral artery flow. < 50% stenosis of the left common carotid artery(minimal) Antegrade vertebral artery flow. Follow up in one year is appropriate if clinically indicated. Pulmonary hypertension (I27.20)   Allergies Anderson Malta Sergeant; 2017/07/22 8:46 AM) Codeine Phosphate *ANALGESICS - OPIOID*  PredniSONE *CORTICOSTEROIDS*   Family History Anderson Malta Sergeant; 07/22/2017 8:46 AM) Mother  Deceased. at age 51, MI at age 65, HTN Father  Deceased. at age 69 Diabetic Coma, no known heart issues Sister 1  older, no known heart issues Brother 1  older,  MI in age 63's one stent placed  Social History Anderson Malta Sergeant; 2017/07/22 8:46 AM) Current tobacco use  Former smoker. quit 07/2015 Non Drinker/No Alcohol Use  Marital status  Divorced. Living Situation  Lives with roommates. Number of Children  1.  Past Surgical History Anderson Malta Sergeant; 06/23/2017 8:46 AM) Hysterectomy; Abdominal  Tonsillectomy  Appendectomy  Renal Scopr w/ Tumor Resect  Back Surgery [2016]:  Medication History Anderson Malta Sergeant; 06/23/2017 9:34 AM) Bisac-Evac (10MG Suppository, Rectal as needed) Active. Multivital (1 Oral daily) Active. Vitamin D (8000UNIT/ML Solution, 1 Oral every other day) Active. Sildenafil Citrate (20MG Tablet, 1 Oral three times daily) Active. Selenium Sulfide (2.25% Shampoo, 1 External every 14 days) Active. Aspirin Low Dose (81MG Tablet DR, 1 Oral daily) Active. Acetaminophen (650MG Tablet, 2 Oral as needed) Active. Albuterol Sulfate ((2.5 MG/3ML)0.083% Nebulized Soln, Inhalation two times daily as needed) Active. ProAir HFA (108 (90 Base)MCG/ACT Aerosol Soln, Inhalation as needed) Active. Abilify (20MG Tablet, 1 Oral at bedtime) Active. Atorvastatin Calcium (20MG Tablet, 1 (one) Tablet Oral daily, Taken starting 05/05/2017) Active. (Pt needs appt for further refills) Symbicort (160-4.5MCG/ACT Aerosol, 2 puffs Inhalation two times daily) Active. Combivent Respimat (20-100MCG/ACT Aerosol Soln, Inhalation two times daily) Active. CoQ10 (10MG Capsule, 1 Oral every other day) Active. Donepezil HCl (5MG Tablet, 1 Oral at bedtime) Active. Dorzolamide HCl-Timolol Mal (22.3-6.8MG/ML Solution, 2 drops Ophthalmic each eye at bedtime) Active. Furosemide (40MG Tablet, 1 Oral two times daily) Active. Glimepiride (4MG Tablet, 1/2 Oral daily) Active. HydrOXYzine HCl (50MG Tablet, 1 Oral at bedtime) Active. Lactulose (10GM/15ML Solution, Oral as needed) Active. LamoTRIgine (100MG Tablet, 1 Oral daily)  Active. Latanoprost (0.005% Solution, 1 drop Ophthalmic both eyes daily) Active. Magnesium (250MG Tablet, 1 Oral every other day) Active. Mirtazapine (7.5MG Tablet, 1 Oral at bedtime) Active. Mucinex D Max Strength (120-1200MG Tablet ER 12HR, 1 Oral two times daily) Active. Polyethylene Glycol 3350 (Oral as needed) Active. Oxygen (3 lites continuously) Active. Senna (8.6MG Capsule, 1 Oral as needed) Active. Medications Reconciled (verified with List from hospital)  Diagnostic Studies History Elizabeth Page, MD; 06/23/2017 10:20 PM) Carotid Doppler [01/27/2017]: Minimal stenosis in the left internal carotid artery a Antegrade right vertebral artery flow. < 50% stenosis of the left common carotid artery(minimal) Antegrade vertebral artery flow. Follow up in one year is appropriate if clinically indicated. Bubble Study [06/06/2017]: Abnormal. Colonoscopy [2014]: removed polyps Nuclear stress test [12/25/2016]: 1. The resting electrocardiogram demonstrated normal sinus rhythm, normal resting conduction, no resting arrhythmias and normal rest repolarization. Stress EKG is non-diagnostic for ischemia as it a pharmacologic stress using Lexiscan. Stress symptoms included dyspnea. 2. The perfusion imaging study demonstrates breast attenuation artifact in the inferior wall and apical thinning but no demonstrable ischemia or scar. There is normal wall motion in all vascular territories, LVEF was calculated at 61%. This represents a low risk study. Endoscopy [2014]: Sleep Study [2017]: Normal. CT Scan of Chest [2017]: Heart Cath [06/11/2017]: Distal RCA 70%, ostial rate. 50% stenosis, very mild coronary disease, normal LVEF. Severe pulmonary hypertension with markedly elevated LVEDP, PA pressure 77/44 mmHg with a mean of 63 mmHg. LVEDP 29 mmHg. Preserved CO and CI.    Review of Systems Elizabeth Page MD; 06/23/2017 10:12 PM) General Not Present- Anorexia, Fatigue and  Fever. Respiratory Not Present- Cough. Cardiovascular Present- Difficulty Breathing On Exertion and Shortness of Breath. Not Present- Chest Pain, Claudications, Edema, Orthopnea and Paroxysmal Nocturnal Dyspnea. Gastrointestinal Not Present- Black, Tarry Stool, Change in Bowel Habits and Nausea. Musculoskeletal Present- Backache and Joint Pain (knee). Neurological Not Present- Dizziness, Focal Neurological Symptoms and Syncope. Endocrine Not Present- Cold Intolerance, Excessive Sweating, Heat  Intolerance and Thyroid Problems. Hematology Not Present- Anemia, Easy Bruising, Petechiae and Prolonged Bleeding. All other systems negative  Vitals Anderson Malta Sergeant; 06/23/2017 9:18 AM) 06/23/2017 8:56 AM Weight: 205.31 lb Height: 63in Body Surface Area: 1.96 m Body Mass Index: 36.37 kg/m  Pulse: 85 (Regular)  P.OX: 94% (Room air) BP: 111/79 (Sitting, Left Arm, Standard)       Physical Exam Elizabeth Page MD; 06/23/2017 10:12 PM) General Mental Status-Alert. General Appearance-Cooperative, Appears stated age, Not in acute distress. Build & Nutrition-Moderately built and Moderately obese.  Head and Neck Neck -Note: Short neck and difficult to evaluate JVD.  Thyroid Gland Characteristics - no palpable nodules, no palpable enlargement.  Cardiovascular Cardiovascular examination reveals -normal heart sounds, regular rate and rhythm with no murmurs. Inspection Jugular vein - Right - No Distention.  Abdomen Inspection Contour - Obese and Pannus present. Palpation/Percussion Normal exam - Non Tender and No hepatosplenomegaly. Auscultation Normal exam - Bowel sounds normal.  Peripheral Vascular Lower Extremity Inspection - Bilateral - Inspection Normal. Palpation - Edema - Bilateral - No edema. Femoral pulse - Bilateral - Feeble(Pulsus difficult to feel due to patient's bodily habitus.), No Bruits. Popliteal pulse - Bilateral - Feeble(Pulsus difficult to  feel due to patient's bodily habitus.). Dorsalis pedis pulse - Bilateral - Normal. Posterior tibial pulse - Bilateral - 0+. Carotid arteries - Bilateral-Soft Bruit.  Neurologic Neurologic evaluation reveals -alert and oriented x 3 with no impairment of recent or remote memory. Motor-Grossly intact without any focal deficits.  Musculoskeletal - Did not examine.    Assessment & Plan Elizabeth Page MD; 06/23/2017 10:25 PM) Pulmonary hypertension (I27.20) Story: Echocardiogram 06/09/2017: Normal LV systolic function, EF 21-30 %. Consistent with RV pressure and volume overload, moderately dilated right ventricle. No intracardiac shunt. Severe pulmonary hypertension, PA pressure 89 mmHg. Findings consistent with chronic corpulmonale with severe pulmonary hypertension. Impression: Heart catheterization 06/11/2017: Distal RCA 70%, ostial rate. 50% stenosis, very mild coronary disease, normal LVEF. Severe pulmonary hypertension with markedly elevated LVEDP, PA pressure 77/44 mmHg with a mean of 63 mmHg. LVEDP 29 mmHg. Preserved CO and CI. Future Plans 8/65/7846: METABOLIC PANEL, BASIC (96295) - one time 07/12/2017: CBC & PLATELETS (AUTO) (28413) - one time 07/12/2017: PT (PROTHROMBIN TIME) (24401) - one time Adenocarcinoma of kidney, left (C64.2) Dyspnea on exertion (R06.09) Story: Lexiscan myoview stress test 12/25/2016: 1. The resting electrocardiogram demonstrated normal sinus rhythm, normal resting conduction, no resting arrhythmias and normal rest repolarization. Stress EKG is non-diagnostic for ischemia as it a pharmacologic stress using Lexiscan. Stress symptoms included dyspnea. 2. The perfusion imaging study demonstrates breast attenuation artifact in the inferior wall and apical thinning but no demonstrable ischemia or scar. There is normal wall motion in all vascular territories, LVEF was calculated at 61%. This represents a low risk study.= Current Plans METABOLIC PANEL, BASIC  (02725) Hypertension, essential, benign (I10) Impression: EKG 06/23/2017: Normal sinus rhythm at the rate of 86 bpm, normal axis, poor R-wave progression, cannot exclude anterior infarct old. Low-voltage complexes. Pulmonary disease pattern.  EKG 12/24/2016: Normal sinus rhythm at the rate of 93 bpm, right axis deviation, left posterior fascicular block. Poor R-wave progression, cannot exclude anterior infarct old. Low-voltage complexes. Pulmonary disease pattern. Current Plans Complete electrocardiogram (93000) Labwork Story: 06/23/2017: Glucose 208, and BUN 36, creatinine 2.28, EGFR 25, sodium 129, potassium 4.7, calcium 10.9.  Labs 06/26/2016: Potassium 4.7, BUN 23, serum glucose 126 minute grams, creatinine 1.6. EGFR 33 mL. CMP otherwise normal. Vitamin D 41, TSH normal, HbA1c 6.0%. Total  cholesterol 21 and 16, triglycerides 144, HDL 65, LDL 122. HB 10.9/HCT 34.4, normal indicis. Platelets 322. COPD with hypoxia (J44.9) Story: Follows Dr. Melvyn Novas. CT scan of the chest 11/10/2016: Increased size of axillary lymphadenopathy on the right, decreased the size of previously described mediastinal lymphadenopathy, central emphysema, aortic atherosclerosis. Bilateral carotid bruits (R09.89) Story: Carotid artery duplex 01/27/2017: Minimal stenosis in the left internal carotid artery a Antegrade right vertebral artery flow. < 50% stenosis of the left common carotid artery(minimal) Antegrade vertebral artery flow. Follow up in one year is appropriate if clinically indicated. Current Plans Mechanism of underlying disease process and action of medications discussed with the patient. Patient presenting for follow-up and evaluation of worsening dyspnea or exertional that occurred over the past 2-3 months, she probably has primary pulmonary hypertension but was complicated combined diastolic heart failure when she had left and right heart catheterization. I reviewed today's labs, patient is in stage IV kidney  disease, very concerned about her overall long-term prognosis with continued hypoxemia, dyspnea but overall patient has started to feel better. I'll set up for a repeat right heart catheterization now that she is well diuresed and is clinically dry. Again discussed regarding weight loss. Blood pressure is well controlled, although has prominent carotid week, and carotid disease. I'll see her back after the right heart catheterization. This was a greater than 40 minute office visit with greater than 50% of the time spent with face-to-face encounter with patient and evaluation of complex medical issues, review of external records and coordination of care.  CC: Barbee Shropshire, NP;    Signed by Elizabeth Page, MD (06/23/2017 10:26 PM)

## 2017-07-19 LAB — ECHOCARDIOGRAM COMPLETE
CHL CUP RV SYS PRESS: 60 mmHg
E decel time: 113 msec
EERAT: 14.68
FS: 31 % (ref 28–44)
Height: 63 in
IVS/LV PW RATIO, ED: 0.96
LA diam end sys: 35 mm
LA diam index: 1.8 cm/m2
LASIZE: 35 mm
LAVOL: 51.4 mL
LAVOLA4C: 50.5 mL
LAVOLIN: 26.5 mL/m2
LV E/e' medial: 14.68
LV E/e'average: 14.68
LVELAT: 7.29 cm/s
LVOT SV: 62 mL
LVOT VTI: 19.8 cm
LVOT area: 3.14 cm2
LVOT peak vel: 98.9 cm/s
LVOTD: 20 mm
MV Dec: 113
MV pk E vel: 107 m/s
MVPG: 5 mmHg
MVPKAVEL: 113 m/s
PW: 9.65 mm — AB (ref 0.6–1.1)
RV LATERAL S' VELOCITY: 8.92 cm/s
RV TAPSE: 21.2 mm
Reg peak vel: 371 cm/s
TDI e' lateral: 7.29
TDI e' medial: 5.66
TRMAXVEL: 371 cm/s
WEIGHTICAEL: 3209.9 [oz_av]

## 2017-07-20 ENCOUNTER — Encounter (HOSPITAL_COMMUNITY)
Admission: RE | Disposition: A | Discharge status: Home / Self Care | Payer: Self-pay | Source: Ambulatory Visit | Attending: Cardiology

## 2017-07-20 ENCOUNTER — Encounter (HOSPITAL_COMMUNITY): Payer: Self-pay | Admitting: *Deleted

## 2017-07-20 ENCOUNTER — Ambulatory Visit (HOSPITAL_COMMUNITY)
Admission: RE | Admit: 2017-07-20 | Discharge: 2017-07-20 | Disposition: A | Discharge status: Home / Self Care | Payer: Medicare Other | Source: Ambulatory Visit | Attending: Cardiology | Admitting: Cardiology

## 2017-07-20 DIAGNOSIS — I445 Left posterior fascicular block: Secondary | ICD-10-CM | POA: Insufficient documentation

## 2017-07-20 DIAGNOSIS — I2510 Atherosclerotic heart disease of native coronary artery without angina pectoris: Secondary | ICD-10-CM | POA: Insufficient documentation

## 2017-07-20 DIAGNOSIS — I5032 Chronic diastolic (congestive) heart failure: Secondary | ICD-10-CM | POA: Insufficient documentation

## 2017-07-20 DIAGNOSIS — E7800 Pure hypercholesterolemia, unspecified: Secondary | ICD-10-CM | POA: Diagnosis not present

## 2017-07-20 DIAGNOSIS — Z7951 Long term (current) use of inhaled steroids: Secondary | ICD-10-CM | POA: Insufficient documentation

## 2017-07-20 DIAGNOSIS — I6522 Occlusion and stenosis of left carotid artery: Secondary | ICD-10-CM | POA: Diagnosis not present

## 2017-07-20 DIAGNOSIS — Z7982 Long term (current) use of aspirin: Secondary | ICD-10-CM | POA: Diagnosis not present

## 2017-07-20 DIAGNOSIS — I700 Atherosclerosis of aorta: Secondary | ICD-10-CM | POA: Diagnosis not present

## 2017-07-20 DIAGNOSIS — E1122 Type 2 diabetes mellitus with diabetic chronic kidney disease: Secondary | ICD-10-CM | POA: Insufficient documentation

## 2017-07-20 DIAGNOSIS — N183 Chronic kidney disease, stage 3 (moderate): Secondary | ICD-10-CM | POA: Diagnosis not present

## 2017-07-20 DIAGNOSIS — R0600 Dyspnea, unspecified: Secondary | ICD-10-CM | POA: Diagnosis present

## 2017-07-20 DIAGNOSIS — J449 Chronic obstructive pulmonary disease, unspecified: Secondary | ICD-10-CM | POA: Diagnosis not present

## 2017-07-20 DIAGNOSIS — I272 Pulmonary hypertension, unspecified: Secondary | ICD-10-CM | POA: Insufficient documentation

## 2017-07-20 DIAGNOSIS — C649 Malignant neoplasm of unspecified kidney, except renal pelvis: Secondary | ICD-10-CM | POA: Insufficient documentation

## 2017-07-20 DIAGNOSIS — Z9981 Dependence on supplemental oxygen: Secondary | ICD-10-CM | POA: Diagnosis not present

## 2017-07-20 DIAGNOSIS — I130 Hypertensive heart and chronic kidney disease with heart failure and stage 1 through stage 4 chronic kidney disease, or unspecified chronic kidney disease: Secondary | ICD-10-CM | POA: Insufficient documentation

## 2017-07-20 DIAGNOSIS — K219 Gastro-esophageal reflux disease without esophagitis: Secondary | ICD-10-CM | POA: Diagnosis not present

## 2017-07-20 HISTORY — PX: RIGHT HEART CATH: CATH118263

## 2017-07-20 LAB — BASIC METABOLIC PANEL
Anion gap: 9 (ref 5–15)
BUN: 19 mg/dL (ref 6–20)
CHLORIDE: 103 mmol/L (ref 101–111)
CO2: 24 mmol/L (ref 22–32)
Calcium: 9.8 mg/dL (ref 8.9–10.3)
Creatinine, Ser: 1.77 mg/dL — ABNORMAL HIGH (ref 0.44–1.00)
GFR calc Af Amer: 33 mL/min — ABNORMAL LOW (ref >60)
GFR calc non Af Amer: 29 mL/min — ABNORMAL LOW (ref >60)
GLUCOSE: 97 mg/dL (ref 65–99)
POTASSIUM: 3.5 mmol/L (ref 3.5–5.1)
SODIUM: 136 mmol/L (ref 135–145)

## 2017-07-20 LAB — POCT I-STAT 3, VENOUS BLOOD GAS (G3P V)
Acid-base deficit: 2 mmol/L (ref 0.0–2.0)
BICARBONATE: 23.1 mmol/L (ref 20.0–28.0)
O2 SAT: 61 %
TCO2: 24 mmol/L (ref 0–100)
pCO2, Ven: 39.5 mmHg — ABNORMAL LOW (ref 44.0–60.0)
pH, Ven: 7.375 (ref 7.250–7.430)
pO2, Ven: 32 mmHg (ref 32.0–45.0)

## 2017-07-20 LAB — CBC
HEMATOCRIT: 34.1 % — AB (ref 36.0–46.0)
HEMOGLOBIN: 10.5 g/dL — AB (ref 12.0–15.0)
MCH: 24.2 pg — AB (ref 26.0–34.0)
MCHC: 30.8 g/dL (ref 30.0–36.0)
MCV: 78.6 fL (ref 78.0–100.0)
Platelets: 558 10*3/uL — ABNORMAL HIGH (ref 150–400)
RBC: 4.34 MIL/uL (ref 3.87–5.11)
RDW: 17.1 % — ABNORMAL HIGH (ref 11.5–15.5)
WBC: 6 10*3/uL (ref 4.0–10.5)

## 2017-07-20 LAB — PROTIME-INR
INR: 0.94
PROTHROMBIN TIME: 12.6 s (ref 11.4–15.2)

## 2017-07-20 LAB — GLUCOSE, CAPILLARY
GLUCOSE-CAPILLARY: 74 mg/dL (ref 65–99)
Glucose-Capillary: 104 mg/dL — ABNORMAL HIGH (ref 65–99)
Glucose-Capillary: 94 mg/dL (ref 65–99)

## 2017-07-20 SURGERY — RIGHT HEART CATH

## 2017-07-20 MED ORDER — SODIUM CHLORIDE 0.9 % IV SOLN
INTRAVENOUS | Status: DC
  Administered 2017-07-20: 13:00:00 via INTRAVENOUS

## 2017-07-20 MED ORDER — LIDOCAINE HCL (PF) 1 % IJ SOLN
INTRAMUSCULAR | Status: DC | PRN
  Administered 2017-07-20: 2 mL

## 2017-07-20 MED ORDER — SODIUM CHLORIDE 0.9 % IV SOLN: 250.0000 mL | INTRAVENOUS | Status: DC | PRN

## 2017-07-20 MED ORDER — HEPARIN (PORCINE) IN NACL 2-0.9 UNIT/ML-% IJ SOLN
INTRAMUSCULAR | Status: AC | PRN
  Administered 2017-07-20: 500 mL

## 2017-07-20 MED ORDER — LIDOCAINE HCL (PF) 1 % IJ SOLN
INTRAMUSCULAR | Status: AC
  Filled 2017-07-20: qty 30

## 2017-07-20 MED ORDER — SODIUM CHLORIDE 0.9% FLUSH: 3.0000 mL | INTRAVENOUS | Status: DC | PRN

## 2017-07-20 MED ORDER — HEPARIN (PORCINE) IN NACL 2-0.9 UNIT/ML-% IJ SOLN
INTRAMUSCULAR | Status: AC
  Filled 2017-07-20: qty 500

## 2017-07-20 MED ORDER — SODIUM CHLORIDE 0.9% FLUSH: 3.0000 mL | Freq: Two times a day (BID) | INTRAVENOUS | Status: DC

## 2017-07-20 SURGICAL SUPPLY — 9 items
CATH BALLN WEDGE 5F 110CM (CATHETERS) ×2 IMPLANT
KIT HEART RIGHT NAMIC (KITS) ×2 IMPLANT
PACK CARDIAC CATHETERIZATION (CUSTOM PROCEDURE TRAY) ×2 IMPLANT
PROTECTION STATION PRESSURIZED (MISCELLANEOUS) ×3
SHEATH GLIDE SLENDER 4/5FR (SHEATH) ×2 IMPLANT
STATION PROTECTION PRESSURIZED (MISCELLANEOUS) IMPLANT
TRANSDUCER W/STOPCOCK (MISCELLANEOUS) ×2 IMPLANT
TUBING ART PRESS 72  MALE/FEM (TUBING) ×2
TUBING ART PRESS 72 MALE/FEM (TUBING) IMPLANT

## 2017-07-20 NOTE — Discharge Instructions (Signed)

## 2017-07-21 ENCOUNTER — Encounter (HOSPITAL_COMMUNITY): Payer: Self-pay | Admitting: Cardiology

## 2017-07-21 DIAGNOSIS — I272 Pulmonary hypertension, unspecified: Secondary | ICD-10-CM | POA: Diagnosis not present

## 2017-07-21 DIAGNOSIS — J449 Chronic obstructive pulmonary disease, unspecified: Secondary | ICD-10-CM | POA: Diagnosis not present

## 2017-07-21 DIAGNOSIS — I129 Hypertensive chronic kidney disease with stage 1 through stage 4 chronic kidney disease, or unspecified chronic kidney disease: Secondary | ICD-10-CM | POA: Diagnosis not present

## 2017-07-21 DIAGNOSIS — E1122 Type 2 diabetes mellitus with diabetic chronic kidney disease: Secondary | ICD-10-CM | POA: Diagnosis not present

## 2017-07-21 DIAGNOSIS — E871 Hypo-osmolality and hyponatremia: Secondary | ICD-10-CM | POA: Diagnosis not present

## 2017-07-26 DIAGNOSIS — M6281 Muscle weakness (generalized): Secondary | ICD-10-CM | POA: Diagnosis not present

## 2017-07-26 DIAGNOSIS — E871 Hypo-osmolality and hyponatremia: Secondary | ICD-10-CM | POA: Diagnosis not present

## 2017-07-27 DIAGNOSIS — I129 Hypertensive chronic kidney disease with stage 1 through stage 4 chronic kidney disease, or unspecified chronic kidney disease: Secondary | ICD-10-CM | POA: Diagnosis not present

## 2017-07-27 DIAGNOSIS — E1122 Type 2 diabetes mellitus with diabetic chronic kidney disease: Secondary | ICD-10-CM | POA: Diagnosis not present

## 2017-07-27 DIAGNOSIS — E871 Hypo-osmolality and hyponatremia: Secondary | ICD-10-CM | POA: Diagnosis not present

## 2017-07-27 DIAGNOSIS — J449 Chronic obstructive pulmonary disease, unspecified: Secondary | ICD-10-CM | POA: Diagnosis not present

## 2017-07-27 DIAGNOSIS — I272 Pulmonary hypertension, unspecified: Secondary | ICD-10-CM | POA: Diagnosis not present

## 2017-07-29 DIAGNOSIS — E119 Type 2 diabetes mellitus without complications: Secondary | ICD-10-CM | POA: Diagnosis not present

## 2017-07-29 DIAGNOSIS — H25813 Combined forms of age-related cataract, bilateral: Secondary | ICD-10-CM | POA: Diagnosis not present

## 2017-07-29 DIAGNOSIS — H401133 Primary open-angle glaucoma, bilateral, severe stage: Secondary | ICD-10-CM | POA: Diagnosis not present

## 2017-07-29 LAB — HM DIABETES EYE EXAM

## 2017-07-30 DIAGNOSIS — R0609 Other forms of dyspnea: Secondary | ICD-10-CM | POA: Diagnosis not present

## 2017-07-30 DIAGNOSIS — J449 Chronic obstructive pulmonary disease, unspecified: Secondary | ICD-10-CM | POA: Diagnosis not present

## 2017-07-30 DIAGNOSIS — I272 Pulmonary hypertension, unspecified: Secondary | ICD-10-CM | POA: Diagnosis not present

## 2017-07-30 DIAGNOSIS — R0902 Hypoxemia: Secondary | ICD-10-CM | POA: Diagnosis not present

## 2017-08-02 DIAGNOSIS — E1122 Type 2 diabetes mellitus with diabetic chronic kidney disease: Secondary | ICD-10-CM | POA: Diagnosis not present

## 2017-08-02 DIAGNOSIS — I129 Hypertensive chronic kidney disease with stage 1 through stage 4 chronic kidney disease, or unspecified chronic kidney disease: Secondary | ICD-10-CM | POA: Diagnosis not present

## 2017-08-02 DIAGNOSIS — E871 Hypo-osmolality and hyponatremia: Secondary | ICD-10-CM | POA: Diagnosis not present

## 2017-08-02 DIAGNOSIS — I272 Pulmonary hypertension, unspecified: Secondary | ICD-10-CM | POA: Diagnosis not present

## 2017-08-02 DIAGNOSIS — J9621 Acute and chronic respiratory failure with hypoxia: Secondary | ICD-10-CM | POA: Diagnosis not present

## 2017-08-02 DIAGNOSIS — J449 Chronic obstructive pulmonary disease, unspecified: Secondary | ICD-10-CM | POA: Diagnosis not present

## 2017-08-04 DIAGNOSIS — I272 Pulmonary hypertension, unspecified: Secondary | ICD-10-CM | POA: Diagnosis not present

## 2017-08-04 DIAGNOSIS — E871 Hypo-osmolality and hyponatremia: Secondary | ICD-10-CM | POA: Diagnosis not present

## 2017-08-04 DIAGNOSIS — J449 Chronic obstructive pulmonary disease, unspecified: Secondary | ICD-10-CM | POA: Diagnosis not present

## 2017-08-04 DIAGNOSIS — I129 Hypertensive chronic kidney disease with stage 1 through stage 4 chronic kidney disease, or unspecified chronic kidney disease: Secondary | ICD-10-CM | POA: Diagnosis not present

## 2017-08-04 DIAGNOSIS — E1122 Type 2 diabetes mellitus with diabetic chronic kidney disease: Secondary | ICD-10-CM | POA: Diagnosis not present

## 2017-08-06 DIAGNOSIS — I272 Pulmonary hypertension, unspecified: Secondary | ICD-10-CM | POA: Diagnosis not present

## 2017-08-06 DIAGNOSIS — J449 Chronic obstructive pulmonary disease, unspecified: Secondary | ICD-10-CM | POA: Diagnosis not present

## 2017-08-06 DIAGNOSIS — E1122 Type 2 diabetes mellitus with diabetic chronic kidney disease: Secondary | ICD-10-CM | POA: Diagnosis not present

## 2017-08-06 DIAGNOSIS — E871 Hypo-osmolality and hyponatremia: Secondary | ICD-10-CM | POA: Diagnosis not present

## 2017-08-06 DIAGNOSIS — I129 Hypertensive chronic kidney disease with stage 1 through stage 4 chronic kidney disease, or unspecified chronic kidney disease: Secondary | ICD-10-CM | POA: Diagnosis not present

## 2017-08-08 DIAGNOSIS — J9621 Acute and chronic respiratory failure with hypoxia: Secondary | ICD-10-CM | POA: Diagnosis not present

## 2017-08-08 DIAGNOSIS — J449 Chronic obstructive pulmonary disease, unspecified: Secondary | ICD-10-CM | POA: Diagnosis not present

## 2017-08-10 DIAGNOSIS — E1122 Type 2 diabetes mellitus with diabetic chronic kidney disease: Secondary | ICD-10-CM | POA: Diagnosis not present

## 2017-08-10 DIAGNOSIS — E871 Hypo-osmolality and hyponatremia: Secondary | ICD-10-CM | POA: Diagnosis not present

## 2017-08-10 DIAGNOSIS — I272 Pulmonary hypertension, unspecified: Secondary | ICD-10-CM | POA: Diagnosis not present

## 2017-08-10 DIAGNOSIS — I129 Hypertensive chronic kidney disease with stage 1 through stage 4 chronic kidney disease, or unspecified chronic kidney disease: Secondary | ICD-10-CM | POA: Diagnosis not present

## 2017-08-10 DIAGNOSIS — J449 Chronic obstructive pulmonary disease, unspecified: Secondary | ICD-10-CM | POA: Diagnosis not present

## 2017-08-11 ENCOUNTER — Telehealth: Payer: Self-pay | Admitting: Internal Medicine

## 2017-08-11 NOTE — Telephone Encounter (Signed)
Called spoke with Marlowe Kays (occupational therapist w/ [facility]) - OT was ordered through pt's PCP.  However Marlowe Kays would like to incorporate breathing exercises using incentive spirometer and flutter valve.  She is requesting MW's okay for this portion.    Verbal order is okay, and a written order will then be faxed for signature  MW please advise, thank you.

## 2017-08-12 ENCOUNTER — Encounter: Payer: Self-pay | Admitting: Neurology

## 2017-08-12 ENCOUNTER — Ambulatory Visit (INDEPENDENT_AMBULATORY_CARE_PROVIDER_SITE_OTHER): Payer: Medicare Other | Admitting: Neurology

## 2017-08-12 VITALS — BP 131/80 | HR 91 | Ht 63.0 in | Wt 199.0 lb

## 2017-08-12 DIAGNOSIS — J9691 Respiratory failure, unspecified with hypoxia: Secondary | ICD-10-CM | POA: Diagnosis not present

## 2017-08-12 DIAGNOSIS — I2721 Secondary pulmonary arterial hypertension: Secondary | ICD-10-CM

## 2017-08-12 DIAGNOSIS — J9692 Respiratory failure, unspecified with hypercapnia: Secondary | ICD-10-CM | POA: Diagnosis not present

## 2017-08-12 NOTE — Progress Notes (Signed)
SLEEP MEDICINE CLINIC   Provider:  Larey Seat, M D  Referring Provider: Einar Gip, MD  Primary neurologist Derek Jack:  Floyde Parkins, MD   Chief Complaint  Patient presents with  . New Patient (Initial Visit)    pt here with neice. pt re- referred to follow up on sleep apnea. pt had a prior sleep study on 1/18./17. pt was hospitalized for hyponatremia and was dc on 07/13/2017. in early July pt wa diagnosed with pulmonary HTN.     HPI:  ORISSA ARREAGA is a 66 y.o. female , seen here as a referral  From Dr Einar Gip, her primary neurologist is Dr. Floyde Parkins.  Mrs. Keileigh Vahey is a22 year old African-American right-handed lady who was just hospitalized for hyponatremia. She was diagnosed with pulmonary hypertension and has followed up with her cardiologist. Cardiologist mentions that she had a split-night study 2016 but did not go on CPAP. However she has worsening dyspnea now. Dr. Jannifer Franklin evaluate this patient and follows for memory deficits. I would like to mention that the patient has a history of left renal cell carcinoma which was cryoablated in July 2017, as of October 2017 she developed a new and separate left tumor and the procedure was repeated -successfully. The patient has long-standing diagnosis of COPD- but dr wert is not sure if she has truly COPD. Pulmonary hypertension. -, is home oxygen dependent, 24 hours a day. She walks with a walker that has a seat built in, she has also diabetes, hyperlipidemia, hypertension and gastroesophageal reflux disease. She quit smoking 18 months ago and had a 40-pack-year history of smoking.  She was admitted to the hospital on June 09 2017, with severe dyspnea with severe dyspnea, acute on chronic diastolic heart failure, COPD exacerbation and acute renal failure. Her niece has helped her to make significant lifestyle changes including regional loosing her salt intake, the patient was aggressively diuresed, echocardiogram confirmed an ejection  fraction of 55-60% with grade 2 diastolic dysfunction and moderate aortic calcification without stenosis. She has bilateral carotid bruits but a duplex from 01/27/2017 showed minimal stenosis. She is followed for her pulmonary disease by Dr. Melvyn Novas.  The patient has an established pulmonologist, she was furbished with oxygen,  supply through Dr. Morrison Old office, [facility] provides the refills.  I would like to "hear from the polysomnography study from 01/01/2016 performed at [facility] sleep. The patient had no sleep apnea her AHI was 1.5 even during REM sleep her apnea index was 8.9, she had prolonged hypoxemia and is now treated with oxygen night and day. She retained CO2 and this was later confirmed by an arterial blood gas. Based on her study she's not a CPAP candidate. She did not have apnea but she has pulmonary failure, this is called acute on chronic respiratory failure. She will remain on oxygen 24 hours a day. This in return will decrease her CO2 levels.       Sleep habits are as follows: 08-12-2017   Patient sleeps in her bed and usually goes to bed between 11 PM and midnight. She does have a TV in the bedroom and reports that "the TV watches her " but she sleeps. The bedroom is otherwise cool - still neither dark nor quiet. She usually is asleep rather promptly- she will have 3 hours of sleep than she wakes - she has the urge to urinate by 2 Am, 4 AM, 6 AM. She will fall asleep promptly again . She reports  Using a bedside commode. She  [facility]  up at 5 and rises usually at 6 AM. By 9 am she feels ready for a nap. By midday she will usually nap and sometimes her naps will take up to 2 hours. She usually keeps it at 1 nap but often up to 1 hour. She is now barely mobile. She is exposing herself rarely to daylight.   Sleep medical history and family sleep history:  Unknown if anybody was diagnosed -many members snored.  Social history:  Former smoker, quit last in August 2016, had at that time smoked  for about 40 years. Prior to 2006 she also had to quit smoking for 3 years , previously smoked for about a decade. No alcohol use, single. She lives with a niece. Decaffeinated coffee, no iced tea and no dark soda.   Last visit at [facility] with Dr. Jannifer Franklin.: Ms. Artus is a 66 year old right-handed white female with a schizoaffective disorder, admitted to the hospital on 08/16/2014 with psychosis. The patient has had some confusion coming out of that hospitalization, but over time, the family indicates that she has gotten back to her usual baseline.  MRI evaluation of the brain did not show acute abnormalities, and an EEG study was unremarkable. Blood work was unremarkable with exception that she appears to have chronic hypercalcemia. A workup for the hypercalcemia apparently was unrevealing. The patient is apparently doing quite well at this time. She returns for an evaluation.  Past Medical History  Diagnosis Date  . Hypertension   . Bell's palsy   . Glaucoma   . Diabetes mellitus without complication   . Schizo-affective psychosis   . Memory disorder 09/05/2014  . Uterine fibroid   . Hypercalcemia     Past Surgical History  Procedure Laterality Date  . Abdominal hysterectomy    . Tonsillectomy and adenoidectomy    . Appendectomy      Family History  Problem Relation Age of Onset  . Dementia Mother   . Alzheimer's disease Mother   . Heart disease Mother   . Hypertension Mother   . Diabetes Mother   . Diabetes Father   . Alzheimer's disease Sister   . Heart disease Brother   . Heart attack Brother     Social history:  reports that she has been smoking Cigarettes.  She has a 28 pack-year smoking history. She has never used smokeless tobacco. She reports that she does not drink alcohol or use illicit drugs.       Review of Systems: Out of a complete 14 system review, the patient complains of only the following symptoms, and all other reviewed systems are negative.   Epworth score  3 points on questions 1,3,4,5, and 7. 2 on 2,6 and 8.  =21  , Fatigue severity score 42   ,  PH Q depression score :   Social History   Social History  . Marital status: Divorced    Spouse name: N/A  . Number of children: 1  . Years of education: college 4   Occupational History  . disabled    Social History Main Topics  . Smoking status: Former Smoker    Packs/day: 1.00    Years: 28.00    Types: Cigarettes    Quit date: 07/15/2015  . Smokeless tobacco: Never Used  . Alcohol use No  . Drug use: No  . Sexual activity: No   Other Topics Concern  . Not on file   Social History Narrative   Patient is right handed.   Patient  drinks 2 cups caffeine daily.    Family History  Problem Relation Age of Onset  . Dementia Mother   . Alzheimer's disease Mother   . Heart disease Mother   . Hypertension Mother   . Diabetes Mother   . Diabetes Father   . Alzheimer's disease Sister   . Heart disease Brother   . Heart attack Brother   . Bladder Cancer Neg Hx   . Kidney cancer Neg Hx   . Prostate cancer Neg Hx     Past Medical History:  Diagnosis Date  . Acute respiratory failure ([place]) 06/09/2017  . Arthritis   . Back pain   . Bell's palsy   . Bipolar disorder ([place])   . Bronchitis   . Chronic low back pain 05/09/2015  . COPD (chronic obstructive pulmonary disease) ([place])    hyoxia during sleep- using O2 as needed  . Cough with expectoration 05/21/16   recent diagnosis of bronchitis  . Cyst of right kidney   . Diabetes mellitus without complication ([place])    type 2  . Frequency of urination   . GERD (gastroesophageal reflux disease)   . Glaucoma   . History of blood transfusion   . History of colon polyps   . History of hiatal hernia   . History of tobacco use   . Hypercalcemia   . Hyperlipidemia   . Hypertension   . Memory disorder 09/05/2014  . On home oxygen therapy    3L/M [place] at night   . Schizo-affective psychosis ([place])   . Shortness of breath dyspnea     exertion, or with out oxygen  . Uterine fibroid     Past Surgical History:  Procedure Laterality Date  . ABDOMINAL HYSTERECTOMY    . APPENDECTOMY    . COLONOSCOPY W/ POLYPECTOMY    . IR GENERIC HISTORICAL  04/29/2016   IR RADIOLOGIST EVAL & MGMT 04/29/2016 Corrie Mckusick, DO [facility] INTERV RAD  . IR GENERIC HISTORICAL  10/01/2016   IR RADIOLOGIST EVAL & MGMT 10/01/2016 [facility] INTERV RAD  . IR GENERIC HISTORICAL  11/11/2016   IR RADIOLOGIST EVAL & MGMT 11/11/2016 Corrie Mckusick, DO [facility] INTERV RAD  . IR GENERIC HISTORICAL  07/16/2016   IR RADIOLOGIST EVAL & MGMT 07/16/2016 Corrie Mckusick, DO [facility] INTERV RAD  . IR RADIOLOGIST EVAL & MGMT  03/10/2017  . IR RADIOLOGIST EVAL & MGMT  05/13/2017  . LUMBAR LAMINECTOMY/DECOMPRESSION MICRODISCECTOMY N/A 08/14/2015   Procedure: LUMBAR DECOMPRESSION MICRODISCECTOMY L3-S1;  Surgeon: Melina Schools, MD;  Location: [facility];  Service: Orthopedics;  Laterality: N/A;  . RADIOFREQUENCY ABLATION Left 02/05/2017   Procedure: LEFT RENAL CRYOABLATION;  Surgeon: Corrie Mckusick, DO;  Location: [facility];  Service: Anesthesiology;  Laterality: Left;  . RIGHT HEART CATH N/A 07/20/2017   Procedure: Right Heart Cath;  Surgeon: Nigel Mormon, MD;  Location: [place] [facility];  Service: Cardiovascular;  Laterality: N/A;  . RIGHT/LEFT HEART CATH AND CORONARY ANGIOGRAPHY N/A 06/11/2017   Procedure: Right/Left Heart Cath and Coronary Angiography;  Surgeon: Dixie Dials, MD;  Location: [place] [facility];  Service: Cardiovascular;  Laterality: N/A;  . TONSILLECTOMY AND ADENOIDECTOMY      Current Outpatient Prescriptions  Medication Sig Dispense Refill  . acetaminophen (TYLENOL) 650 MG CR tablet Take 1,300 mg by mouth every 12 (twelve) hours as needed for pain.     [name] albuterol (PROAIR HFA) 108 (90 Base) MCG/ACT inhaler Inhale 2 puffs into the lungs every 6 (six) hours as needed for  wheezing or shortness of breath.    [name] albuterol (PROVENTIL) (2.5 MG/3ML) 0.083% nebulizer solution  Take 2.5 mg by nebulization every 4 (four) hours as needed for wheezing or shortness of breath. PLAN C    . ARIPiprazole (ABILIFY) 20 MG tablet Take 1 tablet (20 mg total) by mouth at bedtime. 30 tablet 0  . ASPERCREME LIDOCAINE EX Apply 1 application topically 2 (two) times daily as needed (back pain).    [name] aspirin EC 81 MG EC tablet Take 1 tablet (81 mg total) by mouth daily. 30 tablet 0  . atorvastatin (LIPITOR) 20 MG tablet Take 20 mg by mouth daily after supper.    . bisacodyl (DULCOLAX) 10 MG suppository Place 1 suppository (10 mg total) rectally daily as needed for moderate constipation. 12 suppository 0  . budesonide-formoterol (SYMBICORT) 160-4.5 MCG/ACT inhaler Inhale 2 puffs into the lungs 2 (two) times daily.    . Coenzyme Q10 (COQ10) 100 MG CAPS Take 100 mg by mouth every other day. Take 1 capsule by mouth every other day after supper (with atorvastatin)    . donepezil (ARICEPT) 5 MG tablet Take 1 tablet (5 mg total) by mouth at bedtime. 30 tablet 0  . dorzolamide-timolol (COSOPT) 22.3-6.8 MG/ML ophthalmic solution Place 1 drop into both eyes 2 (two) times daily.    . feeding supplement (BOOST / RESOURCE BREEZE) LIQD Take 1 Container by mouth 3 (three) times daily between meals. 60 Container 0  . ferrous sulfate 325 (65 FE) MG tablet Take 325 mg by mouth daily after supper.    . furosemide (LASIX) 40 MG tablet Take 1 tablet (40 mg total) by mouth daily. 30 tablet 0  . glimepiride (AMARYL) 4 MG tablet Take 0.5 tablets (2 mg total) by mouth daily with breakfast. 30 tablet 0  . glucose blood (ACCU-CHEK AVIVA) test strip 1 each by Other route 2 (two) times daily. 200 each 5  . Ipratropium-Albuterol (COMBIVENT RESPIMAT) 20-100 MCG/ACT AERS respimat Inhale 1-2 puffs into the lungs every 4 (four) hours as needed for wheezing. PLAN B     . lactulose (CHRONULAC) 10 GM/15ML solution Take 30 mLs (20 g total) by mouth 2 (two) times daily as needed for mild constipation. 240 mL 0  . lamoTRIgine  (LAMICTAL) 100 MG tablet Take 1 tablet (100 mg total) by mouth every evening. (Patient taking differently: Take 100 mg by mouth daily after supper. ) 30 tablet 0  . latanoprost (XALATAN) 0.005 % ophthalmic solution Place 1 drop into both eyes at bedtime. 2.5 mL 12  . Magnesium 250 MG TABS Take 250 mg by mouth See admin instructions. Take 1 tablet (250 mg) by mouth every other day after supper    . Menthol, Topical Analgesic, (BIOFREEZE EX) Apply 1 application topically 2 (two) times daily as needed (back pain).    . metFORMIN (GLUCOPHAGE) 1000 MG tablet Take 1 tablet (1,000 mg total) by mouth daily with breakfast. (Patient taking differently: Take 1,000 mg by mouth daily with breakfast. If not eating well, give 500 mg twice daily instead of 1053m daily)    . OXYGEN Inhale 5 L into the lungs See admin instructions.     . Phenylephrine-APAP-Guaifenesin (MUCINEX SINUS-MAX) 10-650-400 MG/20ML LIQD Take 20 mLs by mouth 2 (two) times daily.    . polyethylene glycol (MIRALAX / GLYCOLAX) packet Take 17 g by mouth daily. (Patient taking differently: Take 17 g by mouth daily as needed (for constipation). Mix in 4-8 oz liquid and drink) 14 each 0  .  Respiratory Therapy Supplies (FLUTTER) DEVI Use as directed. 1 each 0  . Selenium Sulf-Pyrithione-Urea 2.25 % SHAM Apply 1 application topically See admin instructions. Apply topically to scalp for dermatitis once every 14 days  3  . senna (SENOKOT) 8.6 MG tablet Take 1 tablet by mouth daily as needed for constipation.     . sildenafil (REVATIO) 20 MG tablet Take 1 tablet (20 mg total) by mouth 3 (three) times daily. 90 tablet 0   No current facility-administered medications for this visit.     Allergies as of 08/12/2017 - Review Complete 08/12/2017  Allergen Reaction Noted  . Codeine Nausea And Vomiting 08/14/2014  . Nitrostat [nitroglycerin] Other (See Comments) 07/02/2017  . Prednisone Other (See Comments) 08/14/2014    Vitals: BP 131/80   Pulse 91    Ht _0  (1.6 m)   Wt 199 lb (90.3 kg)   BMI 35.25 kg/m  Last Weight:  Wt Readings from Last 1 Encounters:  08/12/17 199 lb (90.3 kg)   IFO:YDXA mass index is 35.25 kg/m.     Last Height:   Ht Readings from Last 1 Encounters:  08/12/17 _1  (1.6 m)    Physical exam:  General: The patient is very short of breath, interrupting her speech to gasp for breath.  Head: Normocephalic Neck is supple. Mallampati 5 - uvula is not visible.  She has an excess of soft tissue. She is edentulous.  neck circumference:16. Nasal airflow restricted during allergy season, TMJ click is not evident . Retrognathia is seen.  Cardiovascular:  Regular rate and rhythm, without  murmurs or carotid bruit, and without distended neck veins. Respiratory: Lungs are clear to auscultation. Skin:  Without evidence of edema, or rash- skinny ankles  Trunk: BMI is 35   Neurologic exam : The patient is awake and alert, oriented to place and time.   Memory subjective  described as intact. This is not the observation of her knees and other family members.  Previous memory testing has been performed with Dr. Jannifer Franklin and is not part of today's evaluation mini-mental status exam   MOCA:No flowsheet data found. MMSE: MMSE - Mini Mental State Exam 05/09/2015 12/12/2014  Orientation to time 4 5  Orientation to Place 5 5  Registration 3 3  Attention/ Calculation 5 2  Recall 3 3  Language- name 2 objects 2 2  Language- repeat 1 1  Language- follow 3 step command 3 3  Language- read & follow direction 1 1  Write a sentence 1 1  Copy design 0 1  Total score 28 27     Attention span & concentration ability appears normal. Speech is noted with dysarthria, some dysphonia not aphasia. SOB . Cranial nerves: Pupils are equal and briskly reactive to light. Patient has a residual left-sided Bell's palsy. Her left eye is to be a the eyelids show some ptosis but her nasal labial fold is almost symmetric. Extraocular movements  in  vertical and horizontal planes intact and without nystagmus. Visual fields by finger perimetry are intact. Hearing to finger rub intact.  Facial sensation intact to fine touch. Shoulder shrug was symmetrical.   The patient was advised of the nature of the diagnosed sleep disorder , the treatment options and risks for general a health and wellness arising from not treating the condition.  I spent more than 45 minutes of face to face time with the patient. Greater than 50% of time was spent in counseling and coordination of care. We have discussed  the diagnosis and differential and I answered the patient's questions.    Assessment:  After physical and neurologic examination, review of laboratory studies,  Personal review of imaging studies, reports of other /same  Imaging studies ,  Results of polysomnography/ neurophysiology testing and pre-existing records as far as provided in visit., my assessment is   1) Mrs. Evy Lutterman is at a high risk of having central and obstructive sleep apnea.  Her last sleep study found no OSA or CSA and she only qualified for oxygen supplementation- please address further with her [facility]  pulmonologist.  . She has chronic respiratory failure- her in hospital ABG confirmed acute on chronic exacerbation. Not a CPAP candidate.  Her clinical symptoms of extreme sleepiness and fatigue may be related to her the already diagnosed hypoxemia and cause of her primary underlying lung disorder,  but she is waking up with a dry mouth and has been known to snore loudly. She also breezes sometimes irregular. She seems to be a shallow breather even in daytime.   2) Dr. Cruzita Lederer  is her endocrinologist and she has done 2 times 24-hour urine tests, she had hypercalcemia. Hyponatremia, SIADH- and diabetes.    Oxygen may be titrated - this patient is already diagnosed with 2 pulmonary disorders,  Asthma and lung nodules, now pulmonary hypertension.  NO OSA_ NO CSA_ NO CPAP needed.   No sleep clinic follow up.      Asencion Partridge Antwan Bribiesca MD  08/12/2017   CC: Everardo Beals, [facility] [place], [place] [address]  Cc Dr. Einar Gip , Dr. Jannifer Franklin.

## 2017-08-12 NOTE — Telephone Encounter (Signed)
I have spoken with Marlowe Kays with [facility] and gave verbal per MW.  Marlowe Kays states she will fax over order for breathing exercises for MW to sign. Will await fax.

## 2017-08-12 NOTE — Telephone Encounter (Signed)
ok

## 2017-08-12 NOTE — Telephone Encounter (Signed)
Elizabeth Thompson from [employer] is returning call from yesterday.  Her # is [phone]

## 2017-08-12 NOTE — Telephone Encounter (Signed)
lmomtcb x 1 for [place] with [facility] to call us back .

## 2017-08-13 DIAGNOSIS — I129 Hypertensive chronic kidney disease with stage 1 through stage 4 chronic kidney disease, or unspecified chronic kidney disease: Secondary | ICD-10-CM | POA: Diagnosis not present

## 2017-08-13 DIAGNOSIS — E1122 Type 2 diabetes mellitus with diabetic chronic kidney disease: Secondary | ICD-10-CM | POA: Diagnosis not present

## 2017-08-13 DIAGNOSIS — J449 Chronic obstructive pulmonary disease, unspecified: Secondary | ICD-10-CM | POA: Diagnosis not present

## 2017-08-13 DIAGNOSIS — E871 Hypo-osmolality and hyponatremia: Secondary | ICD-10-CM | POA: Diagnosis not present

## 2017-08-13 DIAGNOSIS — I272 Pulmonary hypertension, unspecified: Secondary | ICD-10-CM | POA: Diagnosis not present

## 2017-08-16 DIAGNOSIS — E1122 Type 2 diabetes mellitus with diabetic chronic kidney disease: Secondary | ICD-10-CM | POA: Diagnosis not present

## 2017-08-16 DIAGNOSIS — E871 Hypo-osmolality and hyponatremia: Secondary | ICD-10-CM | POA: Diagnosis not present

## 2017-08-16 DIAGNOSIS — J449 Chronic obstructive pulmonary disease, unspecified: Secondary | ICD-10-CM | POA: Diagnosis not present

## 2017-08-16 DIAGNOSIS — I272 Pulmonary hypertension, unspecified: Secondary | ICD-10-CM | POA: Diagnosis not present

## 2017-08-16 DIAGNOSIS — I129 Hypertensive chronic kidney disease with stage 1 through stage 4 chronic kidney disease, or unspecified chronic kidney disease: Secondary | ICD-10-CM | POA: Diagnosis not present

## 2017-08-17 DIAGNOSIS — J449 Chronic obstructive pulmonary disease, unspecified: Secondary | ICD-10-CM | POA: Diagnosis not present

## 2017-08-17 DIAGNOSIS — I129 Hypertensive chronic kidney disease with stage 1 through stage 4 chronic kidney disease, or unspecified chronic kidney disease: Secondary | ICD-10-CM | POA: Diagnosis not present

## 2017-08-17 DIAGNOSIS — E1122 Type 2 diabetes mellitus with diabetic chronic kidney disease: Secondary | ICD-10-CM | POA: Diagnosis not present

## 2017-08-17 DIAGNOSIS — I272 Pulmonary hypertension, unspecified: Secondary | ICD-10-CM | POA: Diagnosis not present

## 2017-08-17 DIAGNOSIS — E871 Hypo-osmolality and hyponatremia: Secondary | ICD-10-CM | POA: Diagnosis not present

## 2017-08-19 DIAGNOSIS — E871 Hypo-osmolality and hyponatremia: Secondary | ICD-10-CM | POA: Diagnosis not present

## 2017-08-19 DIAGNOSIS — J449 Chronic obstructive pulmonary disease, unspecified: Secondary | ICD-10-CM | POA: Diagnosis not present

## 2017-08-19 DIAGNOSIS — E1122 Type 2 diabetes mellitus with diabetic chronic kidney disease: Secondary | ICD-10-CM | POA: Diagnosis not present

## 2017-08-19 DIAGNOSIS — I272 Pulmonary hypertension, unspecified: Secondary | ICD-10-CM | POA: Diagnosis not present

## 2017-08-19 DIAGNOSIS — I129 Hypertensive chronic kidney disease with stage 1 through stage 4 chronic kidney disease, or unspecified chronic kidney disease: Secondary | ICD-10-CM | POA: Diagnosis not present

## 2017-08-20 DIAGNOSIS — I129 Hypertensive chronic kidney disease with stage 1 through stage 4 chronic kidney disease, or unspecified chronic kidney disease: Secondary | ICD-10-CM | POA: Diagnosis not present

## 2017-08-20 DIAGNOSIS — E871 Hypo-osmolality and hyponatremia: Secondary | ICD-10-CM | POA: Diagnosis not present

## 2017-08-20 DIAGNOSIS — E1122 Type 2 diabetes mellitus with diabetic chronic kidney disease: Secondary | ICD-10-CM | POA: Diagnosis not present

## 2017-08-20 DIAGNOSIS — J449 Chronic obstructive pulmonary disease, unspecified: Secondary | ICD-10-CM | POA: Diagnosis not present

## 2017-08-20 DIAGNOSIS — I272 Pulmonary hypertension, unspecified: Secondary | ICD-10-CM | POA: Diagnosis not present

## 2017-08-24 DIAGNOSIS — E871 Hypo-osmolality and hyponatremia: Secondary | ICD-10-CM | POA: Diagnosis not present

## 2017-08-24 DIAGNOSIS — E1122 Type 2 diabetes mellitus with diabetic chronic kidney disease: Secondary | ICD-10-CM | POA: Diagnosis not present

## 2017-08-24 DIAGNOSIS — J449 Chronic obstructive pulmonary disease, unspecified: Secondary | ICD-10-CM | POA: Diagnosis not present

## 2017-08-24 DIAGNOSIS — I129 Hypertensive chronic kidney disease with stage 1 through stage 4 chronic kidney disease, or unspecified chronic kidney disease: Secondary | ICD-10-CM | POA: Diagnosis not present

## 2017-08-24 DIAGNOSIS — I272 Pulmonary hypertension, unspecified: Secondary | ICD-10-CM | POA: Diagnosis not present

## 2017-08-26 DIAGNOSIS — I129 Hypertensive chronic kidney disease with stage 1 through stage 4 chronic kidney disease, or unspecified chronic kidney disease: Secondary | ICD-10-CM | POA: Diagnosis not present

## 2017-08-26 DIAGNOSIS — E871 Hypo-osmolality and hyponatremia: Secondary | ICD-10-CM | POA: Diagnosis not present

## 2017-08-26 DIAGNOSIS — I272 Pulmonary hypertension, unspecified: Secondary | ICD-10-CM | POA: Diagnosis not present

## 2017-08-26 DIAGNOSIS — E1122 Type 2 diabetes mellitus with diabetic chronic kidney disease: Secondary | ICD-10-CM | POA: Diagnosis not present

## 2017-08-26 DIAGNOSIS — J449 Chronic obstructive pulmonary disease, unspecified: Secondary | ICD-10-CM | POA: Diagnosis not present

## 2017-08-31 DIAGNOSIS — I129 Hypertensive chronic kidney disease with stage 1 through stage 4 chronic kidney disease, or unspecified chronic kidney disease: Secondary | ICD-10-CM | POA: Diagnosis not present

## 2017-08-31 DIAGNOSIS — E871 Hypo-osmolality and hyponatremia: Secondary | ICD-10-CM | POA: Diagnosis not present

## 2017-08-31 DIAGNOSIS — I272 Pulmonary hypertension, unspecified: Secondary | ICD-10-CM | POA: Diagnosis not present

## 2017-08-31 DIAGNOSIS — J449 Chronic obstructive pulmonary disease, unspecified: Secondary | ICD-10-CM | POA: Diagnosis not present

## 2017-08-31 DIAGNOSIS — E1122 Type 2 diabetes mellitus with diabetic chronic kidney disease: Secondary | ICD-10-CM | POA: Diagnosis not present

## 2017-09-02 DIAGNOSIS — J9621 Acute and chronic respiratory failure with hypoxia: Secondary | ICD-10-CM | POA: Diagnosis not present

## 2017-09-02 DIAGNOSIS — E871 Hypo-osmolality and hyponatremia: Secondary | ICD-10-CM | POA: Diagnosis not present

## 2017-09-02 DIAGNOSIS — I129 Hypertensive chronic kidney disease with stage 1 through stage 4 chronic kidney disease, or unspecified chronic kidney disease: Secondary | ICD-10-CM | POA: Diagnosis not present

## 2017-09-02 DIAGNOSIS — E1122 Type 2 diabetes mellitus with diabetic chronic kidney disease: Secondary | ICD-10-CM | POA: Diagnosis not present

## 2017-09-02 DIAGNOSIS — J449 Chronic obstructive pulmonary disease, unspecified: Secondary | ICD-10-CM | POA: Diagnosis not present

## 2017-09-02 DIAGNOSIS — I272 Pulmonary hypertension, unspecified: Secondary | ICD-10-CM | POA: Diagnosis not present

## 2017-09-05 DIAGNOSIS — I509 Heart failure, unspecified: Secondary | ICD-10-CM | POA: Diagnosis not present

## 2017-09-05 DIAGNOSIS — I272 Pulmonary hypertension, unspecified: Secondary | ICD-10-CM | POA: Diagnosis not present

## 2017-09-07 DIAGNOSIS — I129 Hypertensive chronic kidney disease with stage 1 through stage 4 chronic kidney disease, or unspecified chronic kidney disease: Secondary | ICD-10-CM | POA: Diagnosis not present

## 2017-09-07 DIAGNOSIS — I272 Pulmonary hypertension, unspecified: Secondary | ICD-10-CM | POA: Diagnosis not present

## 2017-09-07 DIAGNOSIS — E1122 Type 2 diabetes mellitus with diabetic chronic kidney disease: Secondary | ICD-10-CM | POA: Diagnosis not present

## 2017-09-07 DIAGNOSIS — E871 Hypo-osmolality and hyponatremia: Secondary | ICD-10-CM | POA: Diagnosis not present

## 2017-09-07 DIAGNOSIS — J449 Chronic obstructive pulmonary disease, unspecified: Secondary | ICD-10-CM | POA: Diagnosis not present

## 2017-09-08 DIAGNOSIS — J9621 Acute and chronic respiratory failure with hypoxia: Secondary | ICD-10-CM | POA: Diagnosis not present

## 2017-09-08 DIAGNOSIS — J449 Chronic obstructive pulmonary disease, unspecified: Secondary | ICD-10-CM | POA: Diagnosis not present

## 2017-09-09 DIAGNOSIS — E871 Hypo-osmolality and hyponatremia: Secondary | ICD-10-CM | POA: Diagnosis not present

## 2017-09-09 DIAGNOSIS — I272 Pulmonary hypertension, unspecified: Secondary | ICD-10-CM | POA: Diagnosis not present

## 2017-09-09 DIAGNOSIS — J449 Chronic obstructive pulmonary disease, unspecified: Secondary | ICD-10-CM | POA: Diagnosis not present

## 2017-09-09 DIAGNOSIS — I129 Hypertensive chronic kidney disease with stage 1 through stage 4 chronic kidney disease, or unspecified chronic kidney disease: Secondary | ICD-10-CM | POA: Diagnosis not present

## 2017-09-09 DIAGNOSIS — E1122 Type 2 diabetes mellitus with diabetic chronic kidney disease: Secondary | ICD-10-CM | POA: Diagnosis not present

## 2017-09-10 DIAGNOSIS — E871 Hypo-osmolality and hyponatremia: Secondary | ICD-10-CM | POA: Diagnosis not present

## 2017-09-10 DIAGNOSIS — I272 Pulmonary hypertension, unspecified: Secondary | ICD-10-CM | POA: Diagnosis not present

## 2017-09-10 DIAGNOSIS — I129 Hypertensive chronic kidney disease with stage 1 through stage 4 chronic kidney disease, or unspecified chronic kidney disease: Secondary | ICD-10-CM | POA: Diagnosis not present

## 2017-09-10 DIAGNOSIS — E1122 Type 2 diabetes mellitus with diabetic chronic kidney disease: Secondary | ICD-10-CM | POA: Diagnosis not present

## 2017-09-10 DIAGNOSIS — J449 Chronic obstructive pulmonary disease, unspecified: Secondary | ICD-10-CM | POA: Diagnosis not present

## 2017-09-14 DIAGNOSIS — I272 Pulmonary hypertension, unspecified: Secondary | ICD-10-CM | POA: Diagnosis not present

## 2017-09-14 DIAGNOSIS — I129 Hypertensive chronic kidney disease with stage 1 through stage 4 chronic kidney disease, or unspecified chronic kidney disease: Secondary | ICD-10-CM | POA: Diagnosis not present

## 2017-09-14 DIAGNOSIS — E871 Hypo-osmolality and hyponatremia: Secondary | ICD-10-CM | POA: Diagnosis not present

## 2017-09-14 DIAGNOSIS — E1122 Type 2 diabetes mellitus with diabetic chronic kidney disease: Secondary | ICD-10-CM | POA: Diagnosis not present

## 2017-09-14 DIAGNOSIS — J449 Chronic obstructive pulmonary disease, unspecified: Secondary | ICD-10-CM | POA: Diagnosis not present

## 2017-09-15 DIAGNOSIS — I129 Hypertensive chronic kidney disease with stage 1 through stage 4 chronic kidney disease, or unspecified chronic kidney disease: Secondary | ICD-10-CM | POA: Diagnosis not present

## 2017-09-15 DIAGNOSIS — J449 Chronic obstructive pulmonary disease, unspecified: Secondary | ICD-10-CM | POA: Diagnosis not present

## 2017-09-15 DIAGNOSIS — E1122 Type 2 diabetes mellitus with diabetic chronic kidney disease: Secondary | ICD-10-CM | POA: Diagnosis not present

## 2017-09-15 DIAGNOSIS — E871 Hypo-osmolality and hyponatremia: Secondary | ICD-10-CM | POA: Diagnosis not present

## 2017-09-15 DIAGNOSIS — I272 Pulmonary hypertension, unspecified: Secondary | ICD-10-CM | POA: Diagnosis not present

## 2017-09-16 DIAGNOSIS — E871 Hypo-osmolality and hyponatremia: Secondary | ICD-10-CM | POA: Diagnosis not present

## 2017-09-16 DIAGNOSIS — I272 Pulmonary hypertension, unspecified: Secondary | ICD-10-CM | POA: Diagnosis not present

## 2017-09-16 DIAGNOSIS — E1122 Type 2 diabetes mellitus with diabetic chronic kidney disease: Secondary | ICD-10-CM | POA: Diagnosis not present

## 2017-09-16 DIAGNOSIS — I129 Hypertensive chronic kidney disease with stage 1 through stage 4 chronic kidney disease, or unspecified chronic kidney disease: Secondary | ICD-10-CM | POA: Diagnosis not present

## 2017-09-16 DIAGNOSIS — J449 Chronic obstructive pulmonary disease, unspecified: Secondary | ICD-10-CM | POA: Diagnosis not present

## 2017-09-19 DIAGNOSIS — I129 Hypertensive chronic kidney disease with stage 1 through stage 4 chronic kidney disease, or unspecified chronic kidney disease: Secondary | ICD-10-CM | POA: Diagnosis not present

## 2017-09-19 DIAGNOSIS — E871 Hypo-osmolality and hyponatremia: Secondary | ICD-10-CM | POA: Diagnosis not present

## 2017-09-19 DIAGNOSIS — J449 Chronic obstructive pulmonary disease, unspecified: Secondary | ICD-10-CM | POA: Diagnosis not present

## 2017-09-19 DIAGNOSIS — E1122 Type 2 diabetes mellitus with diabetic chronic kidney disease: Secondary | ICD-10-CM | POA: Diagnosis not present

## 2017-09-19 DIAGNOSIS — I272 Pulmonary hypertension, unspecified: Secondary | ICD-10-CM | POA: Diagnosis not present

## 2017-09-21 DIAGNOSIS — N183 Chronic kidney disease, stage 3 (moderate): Secondary | ICD-10-CM | POA: Diagnosis not present

## 2017-09-21 DIAGNOSIS — J449 Chronic obstructive pulmonary disease, unspecified: Secondary | ICD-10-CM | POA: Diagnosis not present

## 2017-09-21 DIAGNOSIS — I272 Pulmonary hypertension, unspecified: Secondary | ICD-10-CM | POA: Diagnosis not present

## 2017-09-21 DIAGNOSIS — Z7982 Long term (current) use of aspirin: Secondary | ICD-10-CM | POA: Diagnosis not present

## 2017-09-21 DIAGNOSIS — I129 Hypertensive chronic kidney disease with stage 1 through stage 4 chronic kidney disease, or unspecified chronic kidney disease: Secondary | ICD-10-CM | POA: Diagnosis not present

## 2017-09-21 DIAGNOSIS — E1122 Type 2 diabetes mellitus with diabetic chronic kidney disease: Secondary | ICD-10-CM | POA: Diagnosis not present

## 2017-09-21 DIAGNOSIS — J9611 Chronic respiratory failure with hypoxia: Secondary | ICD-10-CM | POA: Diagnosis not present

## 2017-09-21 DIAGNOSIS — Z7951 Long term (current) use of inhaled steroids: Secondary | ICD-10-CM | POA: Diagnosis not present

## 2017-09-21 DIAGNOSIS — Z9981 Dependence on supplemental oxygen: Secondary | ICD-10-CM | POA: Diagnosis not present

## 2017-09-21 DIAGNOSIS — E785 Hyperlipidemia, unspecified: Secondary | ICD-10-CM | POA: Diagnosis not present

## 2017-09-21 DIAGNOSIS — E871 Hypo-osmolality and hyponatremia: Secondary | ICD-10-CM | POA: Diagnosis not present

## 2017-09-21 DIAGNOSIS — Z7984 Long term (current) use of oral hypoglycemic drugs: Secondary | ICD-10-CM | POA: Diagnosis not present

## 2017-09-23 DIAGNOSIS — E1122 Type 2 diabetes mellitus with diabetic chronic kidney disease: Secondary | ICD-10-CM | POA: Diagnosis not present

## 2017-09-23 DIAGNOSIS — I272 Pulmonary hypertension, unspecified: Secondary | ICD-10-CM | POA: Diagnosis not present

## 2017-09-23 DIAGNOSIS — N183 Chronic kidney disease, stage 3 (moderate): Secondary | ICD-10-CM | POA: Diagnosis not present

## 2017-09-23 DIAGNOSIS — Z7984 Long term (current) use of oral hypoglycemic drugs: Secondary | ICD-10-CM | POA: Diagnosis not present

## 2017-09-23 DIAGNOSIS — I129 Hypertensive chronic kidney disease with stage 1 through stage 4 chronic kidney disease, or unspecified chronic kidney disease: Secondary | ICD-10-CM | POA: Diagnosis not present

## 2017-09-23 DIAGNOSIS — J449 Chronic obstructive pulmonary disease, unspecified: Secondary | ICD-10-CM | POA: Diagnosis not present

## 2017-09-23 DIAGNOSIS — Z7951 Long term (current) use of inhaled steroids: Secondary | ICD-10-CM | POA: Diagnosis not present

## 2017-09-23 DIAGNOSIS — Z7982 Long term (current) use of aspirin: Secondary | ICD-10-CM | POA: Diagnosis not present

## 2017-09-23 DIAGNOSIS — E785 Hyperlipidemia, unspecified: Secondary | ICD-10-CM | POA: Diagnosis not present

## 2017-09-23 DIAGNOSIS — Z9981 Dependence on supplemental oxygen: Secondary | ICD-10-CM | POA: Diagnosis not present

## 2017-09-23 DIAGNOSIS — E871 Hypo-osmolality and hyponatremia: Secondary | ICD-10-CM | POA: Diagnosis not present

## 2017-09-23 DIAGNOSIS — J9611 Chronic respiratory failure with hypoxia: Secondary | ICD-10-CM | POA: Diagnosis not present

## 2017-09-28 DIAGNOSIS — Z7951 Long term (current) use of inhaled steroids: Secondary | ICD-10-CM | POA: Diagnosis not present

## 2017-09-28 DIAGNOSIS — E1122 Type 2 diabetes mellitus with diabetic chronic kidney disease: Secondary | ICD-10-CM | POA: Diagnosis not present

## 2017-09-28 DIAGNOSIS — E785 Hyperlipidemia, unspecified: Secondary | ICD-10-CM | POA: Diagnosis not present

## 2017-09-28 DIAGNOSIS — I129 Hypertensive chronic kidney disease with stage 1 through stage 4 chronic kidney disease, or unspecified chronic kidney disease: Secondary | ICD-10-CM | POA: Diagnosis not present

## 2017-09-28 DIAGNOSIS — E871 Hypo-osmolality and hyponatremia: Secondary | ICD-10-CM | POA: Diagnosis not present

## 2017-09-28 DIAGNOSIS — Z9981 Dependence on supplemental oxygen: Secondary | ICD-10-CM | POA: Diagnosis not present

## 2017-09-28 DIAGNOSIS — I272 Pulmonary hypertension, unspecified: Secondary | ICD-10-CM | POA: Diagnosis not present

## 2017-09-28 DIAGNOSIS — J9611 Chronic respiratory failure with hypoxia: Secondary | ICD-10-CM | POA: Diagnosis not present

## 2017-09-28 DIAGNOSIS — N183 Chronic kidney disease, stage 3 (moderate): Secondary | ICD-10-CM | POA: Diagnosis not present

## 2017-09-28 DIAGNOSIS — Z7984 Long term (current) use of oral hypoglycemic drugs: Secondary | ICD-10-CM | POA: Diagnosis not present

## 2017-09-28 DIAGNOSIS — Z7982 Long term (current) use of aspirin: Secondary | ICD-10-CM | POA: Diagnosis not present

## 2017-09-28 DIAGNOSIS — J449 Chronic obstructive pulmonary disease, unspecified: Secondary | ICD-10-CM | POA: Diagnosis not present

## 2017-09-29 DIAGNOSIS — E785 Hyperlipidemia, unspecified: Secondary | ICD-10-CM | POA: Diagnosis not present

## 2017-09-29 DIAGNOSIS — E871 Hypo-osmolality and hyponatremia: Secondary | ICD-10-CM | POA: Diagnosis not present

## 2017-09-29 DIAGNOSIS — I272 Pulmonary hypertension, unspecified: Secondary | ICD-10-CM | POA: Diagnosis not present

## 2017-09-29 DIAGNOSIS — I129 Hypertensive chronic kidney disease with stage 1 through stage 4 chronic kidney disease, or unspecified chronic kidney disease: Secondary | ICD-10-CM | POA: Diagnosis not present

## 2017-09-29 DIAGNOSIS — E1122 Type 2 diabetes mellitus with diabetic chronic kidney disease: Secondary | ICD-10-CM | POA: Diagnosis not present

## 2017-09-29 DIAGNOSIS — J9611 Chronic respiratory failure with hypoxia: Secondary | ICD-10-CM | POA: Diagnosis not present

## 2017-09-29 DIAGNOSIS — J449 Chronic obstructive pulmonary disease, unspecified: Secondary | ICD-10-CM | POA: Diagnosis not present

## 2017-09-29 DIAGNOSIS — N183 Chronic kidney disease, stage 3 (moderate): Secondary | ICD-10-CM | POA: Diagnosis not present

## 2017-09-29 DIAGNOSIS — Z7951 Long term (current) use of inhaled steroids: Secondary | ICD-10-CM | POA: Diagnosis not present

## 2017-09-29 DIAGNOSIS — Z7982 Long term (current) use of aspirin: Secondary | ICD-10-CM | POA: Diagnosis not present

## 2017-09-29 DIAGNOSIS — Z7984 Long term (current) use of oral hypoglycemic drugs: Secondary | ICD-10-CM | POA: Diagnosis not present

## 2017-09-29 DIAGNOSIS — Z9981 Dependence on supplemental oxygen: Secondary | ICD-10-CM | POA: Diagnosis not present

## 2017-09-30 DIAGNOSIS — E785 Hyperlipidemia, unspecified: Secondary | ICD-10-CM | POA: Diagnosis not present

## 2017-09-30 DIAGNOSIS — J9611 Chronic respiratory failure with hypoxia: Secondary | ICD-10-CM | POA: Diagnosis not present

## 2017-09-30 DIAGNOSIS — I129 Hypertensive chronic kidney disease with stage 1 through stage 4 chronic kidney disease, or unspecified chronic kidney disease: Secondary | ICD-10-CM | POA: Diagnosis not present

## 2017-09-30 DIAGNOSIS — N183 Chronic kidney disease, stage 3 (moderate): Secondary | ICD-10-CM | POA: Diagnosis not present

## 2017-09-30 DIAGNOSIS — E871 Hypo-osmolality and hyponatremia: Secondary | ICD-10-CM | POA: Diagnosis not present

## 2017-09-30 DIAGNOSIS — J449 Chronic obstructive pulmonary disease, unspecified: Secondary | ICD-10-CM | POA: Diagnosis not present

## 2017-09-30 DIAGNOSIS — E1122 Type 2 diabetes mellitus with diabetic chronic kidney disease: Secondary | ICD-10-CM | POA: Diagnosis not present

## 2017-09-30 DIAGNOSIS — Z7984 Long term (current) use of oral hypoglycemic drugs: Secondary | ICD-10-CM | POA: Diagnosis not present

## 2017-09-30 DIAGNOSIS — Z7951 Long term (current) use of inhaled steroids: Secondary | ICD-10-CM | POA: Diagnosis not present

## 2017-09-30 DIAGNOSIS — I272 Pulmonary hypertension, unspecified: Secondary | ICD-10-CM | POA: Diagnosis not present

## 2017-09-30 DIAGNOSIS — Z9981 Dependence on supplemental oxygen: Secondary | ICD-10-CM | POA: Diagnosis not present

## 2017-09-30 DIAGNOSIS — Z7982 Long term (current) use of aspirin: Secondary | ICD-10-CM | POA: Diagnosis not present

## 2017-10-01 DIAGNOSIS — E871 Hypo-osmolality and hyponatremia: Secondary | ICD-10-CM | POA: Diagnosis not present

## 2017-10-01 DIAGNOSIS — Z7951 Long term (current) use of inhaled steroids: Secondary | ICD-10-CM | POA: Diagnosis not present

## 2017-10-01 DIAGNOSIS — Z7984 Long term (current) use of oral hypoglycemic drugs: Secondary | ICD-10-CM | POA: Diagnosis not present

## 2017-10-01 DIAGNOSIS — I272 Pulmonary hypertension, unspecified: Secondary | ICD-10-CM | POA: Diagnosis not present

## 2017-10-01 DIAGNOSIS — Z9981 Dependence on supplemental oxygen: Secondary | ICD-10-CM | POA: Diagnosis not present

## 2017-10-01 DIAGNOSIS — Z7982 Long term (current) use of aspirin: Secondary | ICD-10-CM | POA: Diagnosis not present

## 2017-10-01 DIAGNOSIS — E785 Hyperlipidemia, unspecified: Secondary | ICD-10-CM | POA: Diagnosis not present

## 2017-10-01 DIAGNOSIS — J449 Chronic obstructive pulmonary disease, unspecified: Secondary | ICD-10-CM | POA: Diagnosis not present

## 2017-10-01 DIAGNOSIS — E1122 Type 2 diabetes mellitus with diabetic chronic kidney disease: Secondary | ICD-10-CM | POA: Diagnosis not present

## 2017-10-01 DIAGNOSIS — J9611 Chronic respiratory failure with hypoxia: Secondary | ICD-10-CM | POA: Diagnosis not present

## 2017-10-01 DIAGNOSIS — I129 Hypertensive chronic kidney disease with stage 1 through stage 4 chronic kidney disease, or unspecified chronic kidney disease: Secondary | ICD-10-CM | POA: Diagnosis not present

## 2017-10-01 DIAGNOSIS — N183 Chronic kidney disease, stage 3 (moderate): Secondary | ICD-10-CM | POA: Diagnosis not present

## 2017-10-02 DIAGNOSIS — J449 Chronic obstructive pulmonary disease, unspecified: Secondary | ICD-10-CM | POA: Diagnosis not present

## 2017-10-02 DIAGNOSIS — J9621 Acute and chronic respiratory failure with hypoxia: Secondary | ICD-10-CM | POA: Diagnosis not present

## 2017-10-05 DIAGNOSIS — Z7951 Long term (current) use of inhaled steroids: Secondary | ICD-10-CM | POA: Diagnosis not present

## 2017-10-05 DIAGNOSIS — Z7984 Long term (current) use of oral hypoglycemic drugs: Secondary | ICD-10-CM | POA: Diagnosis not present

## 2017-10-05 DIAGNOSIS — E1122 Type 2 diabetes mellitus with diabetic chronic kidney disease: Secondary | ICD-10-CM | POA: Diagnosis not present

## 2017-10-05 DIAGNOSIS — E785 Hyperlipidemia, unspecified: Secondary | ICD-10-CM | POA: Diagnosis not present

## 2017-10-05 DIAGNOSIS — I272 Pulmonary hypertension, unspecified: Secondary | ICD-10-CM | POA: Diagnosis not present

## 2017-10-05 DIAGNOSIS — E871 Hypo-osmolality and hyponatremia: Secondary | ICD-10-CM | POA: Diagnosis not present

## 2017-10-05 DIAGNOSIS — J9611 Chronic respiratory failure with hypoxia: Secondary | ICD-10-CM | POA: Diagnosis not present

## 2017-10-05 DIAGNOSIS — N183 Chronic kidney disease, stage 3 (moderate): Secondary | ICD-10-CM | POA: Diagnosis not present

## 2017-10-05 DIAGNOSIS — I129 Hypertensive chronic kidney disease with stage 1 through stage 4 chronic kidney disease, or unspecified chronic kidney disease: Secondary | ICD-10-CM | POA: Diagnosis not present

## 2017-10-05 DIAGNOSIS — Z7982 Long term (current) use of aspirin: Secondary | ICD-10-CM | POA: Diagnosis not present

## 2017-10-05 DIAGNOSIS — Z9981 Dependence on supplemental oxygen: Secondary | ICD-10-CM | POA: Diagnosis not present

## 2017-10-05 DIAGNOSIS — J449 Chronic obstructive pulmonary disease, unspecified: Secondary | ICD-10-CM | POA: Diagnosis not present

## 2017-10-06 DIAGNOSIS — I2721 Secondary pulmonary arterial hypertension: Secondary | ICD-10-CM | POA: Diagnosis not present

## 2017-10-06 DIAGNOSIS — J449 Chronic obstructive pulmonary disease, unspecified: Secondary | ICD-10-CM | POA: Diagnosis not present

## 2017-10-06 DIAGNOSIS — R0902 Hypoxemia: Secondary | ICD-10-CM | POA: Diagnosis not present

## 2017-10-06 DIAGNOSIS — R0609 Other forms of dyspnea: Secondary | ICD-10-CM | POA: Diagnosis not present

## 2017-10-07 DIAGNOSIS — J9611 Chronic respiratory failure with hypoxia: Secondary | ICD-10-CM | POA: Diagnosis not present

## 2017-10-07 DIAGNOSIS — E871 Hypo-osmolality and hyponatremia: Secondary | ICD-10-CM | POA: Diagnosis not present

## 2017-10-07 DIAGNOSIS — J449 Chronic obstructive pulmonary disease, unspecified: Secondary | ICD-10-CM | POA: Diagnosis not present

## 2017-10-07 DIAGNOSIS — E1122 Type 2 diabetes mellitus with diabetic chronic kidney disease: Secondary | ICD-10-CM | POA: Diagnosis not present

## 2017-10-07 DIAGNOSIS — Z9981 Dependence on supplemental oxygen: Secondary | ICD-10-CM | POA: Diagnosis not present

## 2017-10-07 DIAGNOSIS — Z7984 Long term (current) use of oral hypoglycemic drugs: Secondary | ICD-10-CM | POA: Diagnosis not present

## 2017-10-07 DIAGNOSIS — N183 Chronic kidney disease, stage 3 (moderate): Secondary | ICD-10-CM | POA: Diagnosis not present

## 2017-10-07 DIAGNOSIS — Z7951 Long term (current) use of inhaled steroids: Secondary | ICD-10-CM | POA: Diagnosis not present

## 2017-10-07 DIAGNOSIS — I272 Pulmonary hypertension, unspecified: Secondary | ICD-10-CM | POA: Diagnosis not present

## 2017-10-07 DIAGNOSIS — E785 Hyperlipidemia, unspecified: Secondary | ICD-10-CM | POA: Diagnosis not present

## 2017-10-07 DIAGNOSIS — I129 Hypertensive chronic kidney disease with stage 1 through stage 4 chronic kidney disease, or unspecified chronic kidney disease: Secondary | ICD-10-CM | POA: Diagnosis not present

## 2017-10-07 DIAGNOSIS — Z7982 Long term (current) use of aspirin: Secondary | ICD-10-CM | POA: Diagnosis not present

## 2017-10-08 DIAGNOSIS — J9621 Acute and chronic respiratory failure with hypoxia: Secondary | ICD-10-CM | POA: Diagnosis not present

## 2017-10-08 DIAGNOSIS — J449 Chronic obstructive pulmonary disease, unspecified: Secondary | ICD-10-CM | POA: Diagnosis not present

## 2017-10-12 DIAGNOSIS — Z9981 Dependence on supplemental oxygen: Secondary | ICD-10-CM | POA: Diagnosis not present

## 2017-10-12 DIAGNOSIS — Z7951 Long term (current) use of inhaled steroids: Secondary | ICD-10-CM | POA: Diagnosis not present

## 2017-10-12 DIAGNOSIS — I129 Hypertensive chronic kidney disease with stage 1 through stage 4 chronic kidney disease, or unspecified chronic kidney disease: Secondary | ICD-10-CM | POA: Diagnosis not present

## 2017-10-12 DIAGNOSIS — Z7984 Long term (current) use of oral hypoglycemic drugs: Secondary | ICD-10-CM | POA: Diagnosis not present

## 2017-10-12 DIAGNOSIS — I272 Pulmonary hypertension, unspecified: Secondary | ICD-10-CM | POA: Diagnosis not present

## 2017-10-12 DIAGNOSIS — Z7982 Long term (current) use of aspirin: Secondary | ICD-10-CM | POA: Diagnosis not present

## 2017-10-12 DIAGNOSIS — E1122 Type 2 diabetes mellitus with diabetic chronic kidney disease: Secondary | ICD-10-CM | POA: Diagnosis not present

## 2017-10-12 DIAGNOSIS — N183 Chronic kidney disease, stage 3 (moderate): Secondary | ICD-10-CM | POA: Diagnosis not present

## 2017-10-12 DIAGNOSIS — E785 Hyperlipidemia, unspecified: Secondary | ICD-10-CM | POA: Diagnosis not present

## 2017-10-12 DIAGNOSIS — E871 Hypo-osmolality and hyponatremia: Secondary | ICD-10-CM | POA: Diagnosis not present

## 2017-10-12 DIAGNOSIS — J9611 Chronic respiratory failure with hypoxia: Secondary | ICD-10-CM | POA: Diagnosis not present

## 2017-10-12 DIAGNOSIS — J449 Chronic obstructive pulmonary disease, unspecified: Secondary | ICD-10-CM | POA: Diagnosis not present

## 2017-10-13 DIAGNOSIS — Z7982 Long term (current) use of aspirin: Secondary | ICD-10-CM | POA: Diagnosis not present

## 2017-10-13 DIAGNOSIS — J9611 Chronic respiratory failure with hypoxia: Secondary | ICD-10-CM | POA: Diagnosis not present

## 2017-10-13 DIAGNOSIS — E785 Hyperlipidemia, unspecified: Secondary | ICD-10-CM | POA: Diagnosis not present

## 2017-10-13 DIAGNOSIS — E871 Hypo-osmolality and hyponatremia: Secondary | ICD-10-CM | POA: Diagnosis not present

## 2017-10-13 DIAGNOSIS — J449 Chronic obstructive pulmonary disease, unspecified: Secondary | ICD-10-CM | POA: Diagnosis not present

## 2017-10-13 DIAGNOSIS — I129 Hypertensive chronic kidney disease with stage 1 through stage 4 chronic kidney disease, or unspecified chronic kidney disease: Secondary | ICD-10-CM | POA: Diagnosis not present

## 2017-10-13 DIAGNOSIS — Z7951 Long term (current) use of inhaled steroids: Secondary | ICD-10-CM | POA: Diagnosis not present

## 2017-10-13 DIAGNOSIS — Z7984 Long term (current) use of oral hypoglycemic drugs: Secondary | ICD-10-CM | POA: Diagnosis not present

## 2017-10-13 DIAGNOSIS — Z9981 Dependence on supplemental oxygen: Secondary | ICD-10-CM | POA: Diagnosis not present

## 2017-10-13 DIAGNOSIS — E1122 Type 2 diabetes mellitus with diabetic chronic kidney disease: Secondary | ICD-10-CM | POA: Diagnosis not present

## 2017-10-13 DIAGNOSIS — N183 Chronic kidney disease, stage 3 (moderate): Secondary | ICD-10-CM | POA: Diagnosis not present

## 2017-10-13 DIAGNOSIS — I272 Pulmonary hypertension, unspecified: Secondary | ICD-10-CM | POA: Diagnosis not present

## 2017-10-14 DIAGNOSIS — Z7951 Long term (current) use of inhaled steroids: Secondary | ICD-10-CM | POA: Diagnosis not present

## 2017-10-14 DIAGNOSIS — Z9981 Dependence on supplemental oxygen: Secondary | ICD-10-CM | POA: Diagnosis not present

## 2017-10-14 DIAGNOSIS — N183 Chronic kidney disease, stage 3 (moderate): Secondary | ICD-10-CM | POA: Diagnosis not present

## 2017-10-14 DIAGNOSIS — E1122 Type 2 diabetes mellitus with diabetic chronic kidney disease: Secondary | ICD-10-CM | POA: Diagnosis not present

## 2017-10-14 DIAGNOSIS — Z7984 Long term (current) use of oral hypoglycemic drugs: Secondary | ICD-10-CM | POA: Diagnosis not present

## 2017-10-14 DIAGNOSIS — I272 Pulmonary hypertension, unspecified: Secondary | ICD-10-CM | POA: Diagnosis not present

## 2017-10-14 DIAGNOSIS — I129 Hypertensive chronic kidney disease with stage 1 through stage 4 chronic kidney disease, or unspecified chronic kidney disease: Secondary | ICD-10-CM | POA: Diagnosis not present

## 2017-10-14 DIAGNOSIS — E871 Hypo-osmolality and hyponatremia: Secondary | ICD-10-CM | POA: Diagnosis not present

## 2017-10-14 DIAGNOSIS — Z7982 Long term (current) use of aspirin: Secondary | ICD-10-CM | POA: Diagnosis not present

## 2017-10-14 DIAGNOSIS — J9611 Chronic respiratory failure with hypoxia: Secondary | ICD-10-CM | POA: Diagnosis not present

## 2017-10-14 DIAGNOSIS — E785 Hyperlipidemia, unspecified: Secondary | ICD-10-CM | POA: Diagnosis not present

## 2017-10-14 DIAGNOSIS — J449 Chronic obstructive pulmonary disease, unspecified: Secondary | ICD-10-CM | POA: Diagnosis not present

## 2017-10-15 DIAGNOSIS — I129 Hypertensive chronic kidney disease with stage 1 through stage 4 chronic kidney disease, or unspecified chronic kidney disease: Secondary | ICD-10-CM | POA: Diagnosis not present

## 2017-10-15 DIAGNOSIS — E871 Hypo-osmolality and hyponatremia: Secondary | ICD-10-CM | POA: Diagnosis not present

## 2017-10-15 DIAGNOSIS — E1122 Type 2 diabetes mellitus with diabetic chronic kidney disease: Secondary | ICD-10-CM | POA: Diagnosis not present

## 2017-10-15 DIAGNOSIS — Z7982 Long term (current) use of aspirin: Secondary | ICD-10-CM | POA: Diagnosis not present

## 2017-10-15 DIAGNOSIS — I272 Pulmonary hypertension, unspecified: Secondary | ICD-10-CM | POA: Diagnosis not present

## 2017-10-15 DIAGNOSIS — N183 Chronic kidney disease, stage 3 (moderate): Secondary | ICD-10-CM | POA: Diagnosis not present

## 2017-10-15 DIAGNOSIS — J449 Chronic obstructive pulmonary disease, unspecified: Secondary | ICD-10-CM | POA: Diagnosis not present

## 2017-10-15 DIAGNOSIS — Z9981 Dependence on supplemental oxygen: Secondary | ICD-10-CM | POA: Diagnosis not present

## 2017-10-15 DIAGNOSIS — Z7951 Long term (current) use of inhaled steroids: Secondary | ICD-10-CM | POA: Diagnosis not present

## 2017-10-15 DIAGNOSIS — J9611 Chronic respiratory failure with hypoxia: Secondary | ICD-10-CM | POA: Diagnosis not present

## 2017-10-15 DIAGNOSIS — Z7984 Long term (current) use of oral hypoglycemic drugs: Secondary | ICD-10-CM | POA: Diagnosis not present

## 2017-10-15 DIAGNOSIS — E785 Hyperlipidemia, unspecified: Secondary | ICD-10-CM | POA: Diagnosis not present

## 2017-10-19 DIAGNOSIS — Z7984 Long term (current) use of oral hypoglycemic drugs: Secondary | ICD-10-CM | POA: Diagnosis not present

## 2017-10-19 DIAGNOSIS — I129 Hypertensive chronic kidney disease with stage 1 through stage 4 chronic kidney disease, or unspecified chronic kidney disease: Secondary | ICD-10-CM | POA: Diagnosis not present

## 2017-10-19 DIAGNOSIS — E785 Hyperlipidemia, unspecified: Secondary | ICD-10-CM | POA: Diagnosis not present

## 2017-10-19 DIAGNOSIS — Z9981 Dependence on supplemental oxygen: Secondary | ICD-10-CM | POA: Diagnosis not present

## 2017-10-19 DIAGNOSIS — E1122 Type 2 diabetes mellitus with diabetic chronic kidney disease: Secondary | ICD-10-CM | POA: Diagnosis not present

## 2017-10-19 DIAGNOSIS — N183 Chronic kidney disease, stage 3 (moderate): Secondary | ICD-10-CM | POA: Diagnosis not present

## 2017-10-19 DIAGNOSIS — Z7951 Long term (current) use of inhaled steroids: Secondary | ICD-10-CM | POA: Diagnosis not present

## 2017-10-19 DIAGNOSIS — J9611 Chronic respiratory failure with hypoxia: Secondary | ICD-10-CM | POA: Diagnosis not present

## 2017-10-19 DIAGNOSIS — Z7982 Long term (current) use of aspirin: Secondary | ICD-10-CM | POA: Diagnosis not present

## 2017-10-19 DIAGNOSIS — E871 Hypo-osmolality and hyponatremia: Secondary | ICD-10-CM | POA: Diagnosis not present

## 2017-10-19 DIAGNOSIS — J449 Chronic obstructive pulmonary disease, unspecified: Secondary | ICD-10-CM | POA: Diagnosis not present

## 2017-10-19 DIAGNOSIS — I272 Pulmonary hypertension, unspecified: Secondary | ICD-10-CM | POA: Diagnosis not present

## 2017-10-21 DIAGNOSIS — Z7984 Long term (current) use of oral hypoglycemic drugs: Secondary | ICD-10-CM | POA: Diagnosis not present

## 2017-10-21 DIAGNOSIS — Z7951 Long term (current) use of inhaled steroids: Secondary | ICD-10-CM | POA: Diagnosis not present

## 2017-10-21 DIAGNOSIS — J9611 Chronic respiratory failure with hypoxia: Secondary | ICD-10-CM | POA: Diagnosis not present

## 2017-10-21 DIAGNOSIS — I129 Hypertensive chronic kidney disease with stage 1 through stage 4 chronic kidney disease, or unspecified chronic kidney disease: Secondary | ICD-10-CM | POA: Diagnosis not present

## 2017-10-21 DIAGNOSIS — J449 Chronic obstructive pulmonary disease, unspecified: Secondary | ICD-10-CM | POA: Diagnosis not present

## 2017-10-21 DIAGNOSIS — E1122 Type 2 diabetes mellitus with diabetic chronic kidney disease: Secondary | ICD-10-CM | POA: Diagnosis not present

## 2017-10-21 DIAGNOSIS — N183 Chronic kidney disease, stage 3 (moderate): Secondary | ICD-10-CM | POA: Diagnosis not present

## 2017-10-21 DIAGNOSIS — E785 Hyperlipidemia, unspecified: Secondary | ICD-10-CM | POA: Diagnosis not present

## 2017-10-21 DIAGNOSIS — I272 Pulmonary hypertension, unspecified: Secondary | ICD-10-CM | POA: Diagnosis not present

## 2017-10-21 DIAGNOSIS — Z7982 Long term (current) use of aspirin: Secondary | ICD-10-CM | POA: Diagnosis not present

## 2017-10-21 DIAGNOSIS — E871 Hypo-osmolality and hyponatremia: Secondary | ICD-10-CM | POA: Diagnosis not present

## 2017-10-21 DIAGNOSIS — Z9981 Dependence on supplemental oxygen: Secondary | ICD-10-CM | POA: Diagnosis not present

## 2017-10-22 ENCOUNTER — Other Ambulatory Visit: Payer: Self-pay | Admitting: Interventional Radiology

## 2017-10-22 DIAGNOSIS — C642 Malignant neoplasm of left kidney, except renal pelvis: Secondary | ICD-10-CM

## 2017-10-25 DIAGNOSIS — Z23 Encounter for immunization: Secondary | ICD-10-CM | POA: Diagnosis not present

## 2017-10-26 DIAGNOSIS — N183 Chronic kidney disease, stage 3 (moderate): Secondary | ICD-10-CM | POA: Diagnosis not present

## 2017-10-26 DIAGNOSIS — J449 Chronic obstructive pulmonary disease, unspecified: Secondary | ICD-10-CM | POA: Diagnosis not present

## 2017-10-26 DIAGNOSIS — Z7951 Long term (current) use of inhaled steroids: Secondary | ICD-10-CM | POA: Diagnosis not present

## 2017-10-26 DIAGNOSIS — E785 Hyperlipidemia, unspecified: Secondary | ICD-10-CM | POA: Diagnosis not present

## 2017-10-26 DIAGNOSIS — I272 Pulmonary hypertension, unspecified: Secondary | ICD-10-CM | POA: Diagnosis not present

## 2017-10-26 DIAGNOSIS — Z7982 Long term (current) use of aspirin: Secondary | ICD-10-CM | POA: Diagnosis not present

## 2017-10-26 DIAGNOSIS — E871 Hypo-osmolality and hyponatremia: Secondary | ICD-10-CM | POA: Diagnosis not present

## 2017-10-26 DIAGNOSIS — J9611 Chronic respiratory failure with hypoxia: Secondary | ICD-10-CM | POA: Diagnosis not present

## 2017-10-26 DIAGNOSIS — Z7984 Long term (current) use of oral hypoglycemic drugs: Secondary | ICD-10-CM | POA: Diagnosis not present

## 2017-10-26 DIAGNOSIS — E1122 Type 2 diabetes mellitus with diabetic chronic kidney disease: Secondary | ICD-10-CM | POA: Diagnosis not present

## 2017-10-26 DIAGNOSIS — Z9981 Dependence on supplemental oxygen: Secondary | ICD-10-CM | POA: Diagnosis not present

## 2017-10-26 DIAGNOSIS — I129 Hypertensive chronic kidney disease with stage 1 through stage 4 chronic kidney disease, or unspecified chronic kidney disease: Secondary | ICD-10-CM | POA: Diagnosis not present

## 2017-10-28 DIAGNOSIS — E1122 Type 2 diabetes mellitus with diabetic chronic kidney disease: Secondary | ICD-10-CM | POA: Diagnosis not present

## 2017-10-28 DIAGNOSIS — Z7982 Long term (current) use of aspirin: Secondary | ICD-10-CM | POA: Diagnosis not present

## 2017-10-28 DIAGNOSIS — Z9981 Dependence on supplemental oxygen: Secondary | ICD-10-CM | POA: Diagnosis not present

## 2017-10-28 DIAGNOSIS — Z7951 Long term (current) use of inhaled steroids: Secondary | ICD-10-CM | POA: Diagnosis not present

## 2017-10-28 DIAGNOSIS — I129 Hypertensive chronic kidney disease with stage 1 through stage 4 chronic kidney disease, or unspecified chronic kidney disease: Secondary | ICD-10-CM | POA: Diagnosis not present

## 2017-10-28 DIAGNOSIS — Z7984 Long term (current) use of oral hypoglycemic drugs: Secondary | ICD-10-CM | POA: Diagnosis not present

## 2017-10-28 DIAGNOSIS — E785 Hyperlipidemia, unspecified: Secondary | ICD-10-CM | POA: Diagnosis not present

## 2017-10-28 DIAGNOSIS — J449 Chronic obstructive pulmonary disease, unspecified: Secondary | ICD-10-CM | POA: Diagnosis not present

## 2017-10-28 DIAGNOSIS — I272 Pulmonary hypertension, unspecified: Secondary | ICD-10-CM | POA: Diagnosis not present

## 2017-10-28 DIAGNOSIS — N183 Chronic kidney disease, stage 3 (moderate): Secondary | ICD-10-CM | POA: Diagnosis not present

## 2017-10-28 DIAGNOSIS — E871 Hypo-osmolality and hyponatremia: Secondary | ICD-10-CM | POA: Diagnosis not present

## 2017-10-28 DIAGNOSIS — J9611 Chronic respiratory failure with hypoxia: Secondary | ICD-10-CM | POA: Diagnosis not present

## 2017-10-31 DIAGNOSIS — E1122 Type 2 diabetes mellitus with diabetic chronic kidney disease: Secondary | ICD-10-CM | POA: Diagnosis not present

## 2017-10-31 DIAGNOSIS — J449 Chronic obstructive pulmonary disease, unspecified: Secondary | ICD-10-CM | POA: Diagnosis not present

## 2017-10-31 DIAGNOSIS — I272 Pulmonary hypertension, unspecified: Secondary | ICD-10-CM | POA: Diagnosis not present

## 2017-10-31 DIAGNOSIS — Z9981 Dependence on supplemental oxygen: Secondary | ICD-10-CM | POA: Diagnosis not present

## 2017-10-31 DIAGNOSIS — E871 Hypo-osmolality and hyponatremia: Secondary | ICD-10-CM | POA: Diagnosis not present

## 2017-10-31 DIAGNOSIS — Z7951 Long term (current) use of inhaled steroids: Secondary | ICD-10-CM | POA: Diagnosis not present

## 2017-10-31 DIAGNOSIS — J9611 Chronic respiratory failure with hypoxia: Secondary | ICD-10-CM | POA: Diagnosis not present

## 2017-10-31 DIAGNOSIS — E785 Hyperlipidemia, unspecified: Secondary | ICD-10-CM | POA: Diagnosis not present

## 2017-10-31 DIAGNOSIS — Z7984 Long term (current) use of oral hypoglycemic drugs: Secondary | ICD-10-CM | POA: Diagnosis not present

## 2017-10-31 DIAGNOSIS — I129 Hypertensive chronic kidney disease with stage 1 through stage 4 chronic kidney disease, or unspecified chronic kidney disease: Secondary | ICD-10-CM | POA: Diagnosis not present

## 2017-10-31 DIAGNOSIS — N183 Chronic kidney disease, stage 3 (moderate): Secondary | ICD-10-CM | POA: Diagnosis not present

## 2017-10-31 DIAGNOSIS — Z7982 Long term (current) use of aspirin: Secondary | ICD-10-CM | POA: Diagnosis not present

## 2017-11-01 ENCOUNTER — Ambulatory Visit
Admission: RE | Admit: 2017-11-01 | Discharge: 2017-11-01 | Disposition: A | Discharge status: Home / Self Care | Payer: Medicare Other | Source: Ambulatory Visit | Attending: Interventional Radiology | Admitting: Interventional Radiology

## 2017-11-01 DIAGNOSIS — C642 Malignant neoplasm of left kidney, except renal pelvis: Secondary | ICD-10-CM | POA: Diagnosis not present

## 2017-11-01 MED ORDER — IOPAMIDOL (ISOVUE-300) INJECTION 61%
100.0000 mL | Freq: Once | INTRAVENOUS | Status: AC | PRN
  Administered 2017-11-01: 100 mL via INTRAVENOUS

## 2017-11-02 DIAGNOSIS — Z7951 Long term (current) use of inhaled steroids: Secondary | ICD-10-CM | POA: Diagnosis not present

## 2017-11-02 DIAGNOSIS — N183 Chronic kidney disease, stage 3 (moderate): Secondary | ICD-10-CM | POA: Diagnosis not present

## 2017-11-02 DIAGNOSIS — J9621 Acute and chronic respiratory failure with hypoxia: Secondary | ICD-10-CM | POA: Diagnosis not present

## 2017-11-02 DIAGNOSIS — I272 Pulmonary hypertension, unspecified: Secondary | ICD-10-CM | POA: Diagnosis not present

## 2017-11-02 DIAGNOSIS — I129 Hypertensive chronic kidney disease with stage 1 through stage 4 chronic kidney disease, or unspecified chronic kidney disease: Secondary | ICD-10-CM | POA: Diagnosis not present

## 2017-11-02 DIAGNOSIS — Z7984 Long term (current) use of oral hypoglycemic drugs: Secondary | ICD-10-CM | POA: Diagnosis not present

## 2017-11-02 DIAGNOSIS — J9611 Chronic respiratory failure with hypoxia: Secondary | ICD-10-CM | POA: Diagnosis not present

## 2017-11-02 DIAGNOSIS — J449 Chronic obstructive pulmonary disease, unspecified: Secondary | ICD-10-CM | POA: Diagnosis not present

## 2017-11-02 DIAGNOSIS — E785 Hyperlipidemia, unspecified: Secondary | ICD-10-CM | POA: Diagnosis not present

## 2017-11-02 DIAGNOSIS — E1122 Type 2 diabetes mellitus with diabetic chronic kidney disease: Secondary | ICD-10-CM | POA: Diagnosis not present

## 2017-11-02 DIAGNOSIS — E871 Hypo-osmolality and hyponatremia: Secondary | ICD-10-CM | POA: Diagnosis not present

## 2017-11-02 DIAGNOSIS — Z7982 Long term (current) use of aspirin: Secondary | ICD-10-CM | POA: Diagnosis not present

## 2017-11-02 DIAGNOSIS — Z9981 Dependence on supplemental oxygen: Secondary | ICD-10-CM | POA: Diagnosis not present

## 2017-11-03 DIAGNOSIS — J449 Chronic obstructive pulmonary disease, unspecified: Secondary | ICD-10-CM | POA: Diagnosis not present

## 2017-11-03 DIAGNOSIS — J9611 Chronic respiratory failure with hypoxia: Secondary | ICD-10-CM | POA: Diagnosis not present

## 2017-11-03 DIAGNOSIS — N183 Chronic kidney disease, stage 3 (moderate): Secondary | ICD-10-CM | POA: Diagnosis not present

## 2017-11-03 DIAGNOSIS — I129 Hypertensive chronic kidney disease with stage 1 through stage 4 chronic kidney disease, or unspecified chronic kidney disease: Secondary | ICD-10-CM | POA: Diagnosis not present

## 2017-11-03 DIAGNOSIS — Z7984 Long term (current) use of oral hypoglycemic drugs: Secondary | ICD-10-CM | POA: Diagnosis not present

## 2017-11-03 DIAGNOSIS — I272 Pulmonary hypertension, unspecified: Secondary | ICD-10-CM | POA: Diagnosis not present

## 2017-11-03 DIAGNOSIS — Z7951 Long term (current) use of inhaled steroids: Secondary | ICD-10-CM | POA: Diagnosis not present

## 2017-11-03 DIAGNOSIS — E871 Hypo-osmolality and hyponatremia: Secondary | ICD-10-CM | POA: Diagnosis not present

## 2017-11-03 DIAGNOSIS — E1122 Type 2 diabetes mellitus with diabetic chronic kidney disease: Secondary | ICD-10-CM | POA: Diagnosis not present

## 2017-11-03 DIAGNOSIS — Z9981 Dependence on supplemental oxygen: Secondary | ICD-10-CM | POA: Diagnosis not present

## 2017-11-03 DIAGNOSIS — E785 Hyperlipidemia, unspecified: Secondary | ICD-10-CM | POA: Diagnosis not present

## 2017-11-03 DIAGNOSIS — Z7982 Long term (current) use of aspirin: Secondary | ICD-10-CM | POA: Diagnosis not present

## 2017-11-08 DIAGNOSIS — J9621 Acute and chronic respiratory failure with hypoxia: Secondary | ICD-10-CM | POA: Diagnosis not present

## 2017-11-08 DIAGNOSIS — J449 Chronic obstructive pulmonary disease, unspecified: Secondary | ICD-10-CM | POA: Diagnosis not present

## 2017-11-09 DIAGNOSIS — Z7982 Long term (current) use of aspirin: Secondary | ICD-10-CM | POA: Diagnosis not present

## 2017-11-09 DIAGNOSIS — Z9981 Dependence on supplemental oxygen: Secondary | ICD-10-CM | POA: Diagnosis not present

## 2017-11-09 DIAGNOSIS — E871 Hypo-osmolality and hyponatremia: Secondary | ICD-10-CM | POA: Diagnosis not present

## 2017-11-09 DIAGNOSIS — J9611 Chronic respiratory failure with hypoxia: Secondary | ICD-10-CM | POA: Diagnosis not present

## 2017-11-09 DIAGNOSIS — N183 Chronic kidney disease, stage 3 (moderate): Secondary | ICD-10-CM | POA: Diagnosis not present

## 2017-11-09 DIAGNOSIS — J449 Chronic obstructive pulmonary disease, unspecified: Secondary | ICD-10-CM | POA: Diagnosis not present

## 2017-11-09 DIAGNOSIS — E1122 Type 2 diabetes mellitus with diabetic chronic kidney disease: Secondary | ICD-10-CM | POA: Diagnosis not present

## 2017-11-09 DIAGNOSIS — Z7984 Long term (current) use of oral hypoglycemic drugs: Secondary | ICD-10-CM | POA: Diagnosis not present

## 2017-11-09 DIAGNOSIS — I272 Pulmonary hypertension, unspecified: Secondary | ICD-10-CM | POA: Diagnosis not present

## 2017-11-09 DIAGNOSIS — I129 Hypertensive chronic kidney disease with stage 1 through stage 4 chronic kidney disease, or unspecified chronic kidney disease: Secondary | ICD-10-CM | POA: Diagnosis not present

## 2017-11-09 DIAGNOSIS — E785 Hyperlipidemia, unspecified: Secondary | ICD-10-CM | POA: Diagnosis not present

## 2017-11-09 DIAGNOSIS — Z7951 Long term (current) use of inhaled steroids: Secondary | ICD-10-CM | POA: Diagnosis not present

## 2017-11-10 ENCOUNTER — Other Ambulatory Visit: Payer: Self-pay | Admitting: Interventional Radiology

## 2017-11-10 DIAGNOSIS — J9611 Chronic respiratory failure with hypoxia: Secondary | ICD-10-CM | POA: Diagnosis not present

## 2017-11-10 DIAGNOSIS — E1122 Type 2 diabetes mellitus with diabetic chronic kidney disease: Secondary | ICD-10-CM | POA: Diagnosis not present

## 2017-11-10 DIAGNOSIS — I272 Pulmonary hypertension, unspecified: Secondary | ICD-10-CM | POA: Diagnosis not present

## 2017-11-10 DIAGNOSIS — N183 Chronic kidney disease, stage 3 (moderate): Secondary | ICD-10-CM | POA: Diagnosis not present

## 2017-11-10 DIAGNOSIS — I129 Hypertensive chronic kidney disease with stage 1 through stage 4 chronic kidney disease, or unspecified chronic kidney disease: Secondary | ICD-10-CM | POA: Diagnosis not present

## 2017-11-10 DIAGNOSIS — Z7984 Long term (current) use of oral hypoglycemic drugs: Secondary | ICD-10-CM | POA: Diagnosis not present

## 2017-11-10 DIAGNOSIS — Z7951 Long term (current) use of inhaled steroids: Secondary | ICD-10-CM | POA: Diagnosis not present

## 2017-11-10 DIAGNOSIS — Z7982 Long term (current) use of aspirin: Secondary | ICD-10-CM | POA: Diagnosis not present

## 2017-11-10 DIAGNOSIS — E785 Hyperlipidemia, unspecified: Secondary | ICD-10-CM | POA: Diagnosis not present

## 2017-11-10 DIAGNOSIS — E871 Hypo-osmolality and hyponatremia: Secondary | ICD-10-CM | POA: Diagnosis not present

## 2017-11-10 DIAGNOSIS — J449 Chronic obstructive pulmonary disease, unspecified: Secondary | ICD-10-CM | POA: Diagnosis not present

## 2017-11-10 DIAGNOSIS — Z9981 Dependence on supplemental oxygen: Secondary | ICD-10-CM | POA: Diagnosis not present

## 2017-11-12 DIAGNOSIS — J449 Chronic obstructive pulmonary disease, unspecified: Secondary | ICD-10-CM | POA: Diagnosis not present

## 2017-11-12 DIAGNOSIS — J9611 Chronic respiratory failure with hypoxia: Secondary | ICD-10-CM | POA: Diagnosis not present

## 2017-11-12 DIAGNOSIS — Z9981 Dependence on supplemental oxygen: Secondary | ICD-10-CM | POA: Diagnosis not present

## 2017-11-12 DIAGNOSIS — Z7951 Long term (current) use of inhaled steroids: Secondary | ICD-10-CM | POA: Diagnosis not present

## 2017-11-12 DIAGNOSIS — I272 Pulmonary hypertension, unspecified: Secondary | ICD-10-CM | POA: Diagnosis not present

## 2017-11-12 DIAGNOSIS — Z7982 Long term (current) use of aspirin: Secondary | ICD-10-CM | POA: Diagnosis not present

## 2017-11-12 DIAGNOSIS — E785 Hyperlipidemia, unspecified: Secondary | ICD-10-CM | POA: Diagnosis not present

## 2017-11-12 DIAGNOSIS — E871 Hypo-osmolality and hyponatremia: Secondary | ICD-10-CM | POA: Diagnosis not present

## 2017-11-12 DIAGNOSIS — Z7984 Long term (current) use of oral hypoglycemic drugs: Secondary | ICD-10-CM | POA: Diagnosis not present

## 2017-11-12 DIAGNOSIS — N183 Chronic kidney disease, stage 3 (moderate): Secondary | ICD-10-CM | POA: Diagnosis not present

## 2017-11-12 DIAGNOSIS — E1122 Type 2 diabetes mellitus with diabetic chronic kidney disease: Secondary | ICD-10-CM | POA: Diagnosis not present

## 2017-11-12 DIAGNOSIS — I129 Hypertensive chronic kidney disease with stage 1 through stage 4 chronic kidney disease, or unspecified chronic kidney disease: Secondary | ICD-10-CM | POA: Diagnosis not present

## 2017-11-15 DIAGNOSIS — E871 Hypo-osmolality and hyponatremia: Secondary | ICD-10-CM | POA: Diagnosis not present

## 2017-11-15 DIAGNOSIS — Z7984 Long term (current) use of oral hypoglycemic drugs: Secondary | ICD-10-CM | POA: Diagnosis not present

## 2017-11-15 DIAGNOSIS — J9611 Chronic respiratory failure with hypoxia: Secondary | ICD-10-CM | POA: Diagnosis not present

## 2017-11-15 DIAGNOSIS — Z7982 Long term (current) use of aspirin: Secondary | ICD-10-CM | POA: Diagnosis not present

## 2017-11-15 DIAGNOSIS — E785 Hyperlipidemia, unspecified: Secondary | ICD-10-CM | POA: Diagnosis not present

## 2017-11-15 DIAGNOSIS — N183 Chronic kidney disease, stage 3 (moderate): Secondary | ICD-10-CM | POA: Diagnosis not present

## 2017-11-15 DIAGNOSIS — I272 Pulmonary hypertension, unspecified: Secondary | ICD-10-CM | POA: Diagnosis not present

## 2017-11-15 DIAGNOSIS — Z9981 Dependence on supplemental oxygen: Secondary | ICD-10-CM | POA: Diagnosis not present

## 2017-11-15 DIAGNOSIS — J449 Chronic obstructive pulmonary disease, unspecified: Secondary | ICD-10-CM | POA: Diagnosis not present

## 2017-11-15 DIAGNOSIS — I129 Hypertensive chronic kidney disease with stage 1 through stage 4 chronic kidney disease, or unspecified chronic kidney disease: Secondary | ICD-10-CM | POA: Diagnosis not present

## 2017-11-15 DIAGNOSIS — Z7951 Long term (current) use of inhaled steroids: Secondary | ICD-10-CM | POA: Diagnosis not present

## 2017-11-15 DIAGNOSIS — E1122 Type 2 diabetes mellitus with diabetic chronic kidney disease: Secondary | ICD-10-CM | POA: Diagnosis not present

## 2017-11-17 DIAGNOSIS — Z9981 Dependence on supplemental oxygen: Secondary | ICD-10-CM | POA: Diagnosis not present

## 2017-11-17 DIAGNOSIS — E785 Hyperlipidemia, unspecified: Secondary | ICD-10-CM | POA: Diagnosis not present

## 2017-11-17 DIAGNOSIS — N183 Chronic kidney disease, stage 3 (moderate): Secondary | ICD-10-CM | POA: Diagnosis not present

## 2017-11-17 DIAGNOSIS — J9611 Chronic respiratory failure with hypoxia: Secondary | ICD-10-CM | POA: Diagnosis not present

## 2017-11-17 DIAGNOSIS — I272 Pulmonary hypertension, unspecified: Secondary | ICD-10-CM | POA: Diagnosis not present

## 2017-11-17 DIAGNOSIS — I129 Hypertensive chronic kidney disease with stage 1 through stage 4 chronic kidney disease, or unspecified chronic kidney disease: Secondary | ICD-10-CM | POA: Diagnosis not present

## 2017-11-17 DIAGNOSIS — Z7982 Long term (current) use of aspirin: Secondary | ICD-10-CM | POA: Diagnosis not present

## 2017-11-17 DIAGNOSIS — J449 Chronic obstructive pulmonary disease, unspecified: Secondary | ICD-10-CM | POA: Diagnosis not present

## 2017-11-17 DIAGNOSIS — E1122 Type 2 diabetes mellitus with diabetic chronic kidney disease: Secondary | ICD-10-CM | POA: Diagnosis not present

## 2017-11-17 DIAGNOSIS — Z7951 Long term (current) use of inhaled steroids: Secondary | ICD-10-CM | POA: Diagnosis not present

## 2017-11-17 DIAGNOSIS — Z7984 Long term (current) use of oral hypoglycemic drugs: Secondary | ICD-10-CM | POA: Diagnosis not present

## 2017-11-17 DIAGNOSIS — E871 Hypo-osmolality and hyponatremia: Secondary | ICD-10-CM | POA: Diagnosis not present

## 2017-11-24 ENCOUNTER — Other Ambulatory Visit: Payer: Self-pay

## 2017-12-02 DIAGNOSIS — J9621 Acute and chronic respiratory failure with hypoxia: Secondary | ICD-10-CM | POA: Diagnosis not present

## 2017-12-02 DIAGNOSIS — J449 Chronic obstructive pulmonary disease, unspecified: Secondary | ICD-10-CM | POA: Diagnosis not present

## 2017-12-08 DIAGNOSIS — J9621 Acute and chronic respiratory failure with hypoxia: Secondary | ICD-10-CM | POA: Diagnosis not present

## 2017-12-08 DIAGNOSIS — J449 Chronic obstructive pulmonary disease, unspecified: Secondary | ICD-10-CM | POA: Diagnosis not present

## 2017-12-29 DIAGNOSIS — R0609 Other forms of dyspnea: Secondary | ICD-10-CM | POA: Diagnosis not present

## 2017-12-29 DIAGNOSIS — E7800 Pure hypercholesterolemia, unspecified: Secondary | ICD-10-CM | POA: Diagnosis not present

## 2018-01-02 DIAGNOSIS — J9621 Acute and chronic respiratory failure with hypoxia: Secondary | ICD-10-CM | POA: Diagnosis not present

## 2018-01-02 DIAGNOSIS — J449 Chronic obstructive pulmonary disease, unspecified: Secondary | ICD-10-CM | POA: Diagnosis not present

## 2018-01-03 DIAGNOSIS — I2721 Secondary pulmonary arterial hypertension: Secondary | ICD-10-CM | POA: Diagnosis not present

## 2018-01-03 DIAGNOSIS — R0902 Hypoxemia: Secondary | ICD-10-CM | POA: Diagnosis not present

## 2018-01-03 DIAGNOSIS — J449 Chronic obstructive pulmonary disease, unspecified: Secondary | ICD-10-CM | POA: Diagnosis not present

## 2018-01-03 DIAGNOSIS — R0609 Other forms of dyspnea: Secondary | ICD-10-CM | POA: Diagnosis not present

## 2018-01-05 DIAGNOSIS — D509 Iron deficiency anemia, unspecified: Secondary | ICD-10-CM | POA: Diagnosis not present

## 2018-01-05 DIAGNOSIS — E1122 Type 2 diabetes mellitus with diabetic chronic kidney disease: Secondary | ICD-10-CM | POA: Diagnosis not present

## 2018-01-05 DIAGNOSIS — R0609 Other forms of dyspnea: Secondary | ICD-10-CM | POA: Diagnosis not present

## 2018-01-08 DIAGNOSIS — J9621 Acute and chronic respiratory failure with hypoxia: Secondary | ICD-10-CM | POA: Diagnosis not present

## 2018-01-08 DIAGNOSIS — J449 Chronic obstructive pulmonary disease, unspecified: Secondary | ICD-10-CM | POA: Diagnosis not present

## 2018-01-21 ENCOUNTER — Ambulatory Visit: Payer: Medicare Other | Admitting: Urology

## 2018-01-21 ENCOUNTER — Encounter: Payer: Self-pay | Admitting: Urology

## 2018-01-27 DIAGNOSIS — E119 Type 2 diabetes mellitus without complications: Secondary | ICD-10-CM | POA: Diagnosis not present

## 2018-01-27 DIAGNOSIS — H401133 Primary open-angle glaucoma, bilateral, severe stage: Secondary | ICD-10-CM | POA: Diagnosis not present

## 2018-01-28 ENCOUNTER — Ambulatory Visit: Payer: Medicare Other | Admitting: Podiatry

## 2018-01-28 ENCOUNTER — Encounter: Payer: Self-pay | Admitting: Podiatry

## 2018-01-28 DIAGNOSIS — B351 Tinea unguium: Secondary | ICD-10-CM | POA: Diagnosis not present

## 2018-01-28 DIAGNOSIS — E1142 Type 2 diabetes mellitus with diabetic polyneuropathy: Secondary | ICD-10-CM

## 2018-01-28 DIAGNOSIS — M79676 Pain in unspecified toe(s): Secondary | ICD-10-CM

## 2018-01-28 NOTE — Progress Notes (Signed)
Complaint:  Visit Type: Patient returns to my office for continued preventative foot care services. Complaint: Patient states" my nails have grown long and thick and become painful to walk and wear shoes" Patient has been diagnosed with DM with no foot complications. The patient presents for preventative foot care services. No changes to ROS  Podiatric Exam: Vascular: dorsalis pedis and posterior tibial pulses are palpable bilateral. Capillary return is immediate. Temperature gradient is WNL. Skin turgor WNL  Sensorium: Normal Semmes Weinstein monofilament test. Normal tactile sensation bilaterally. Nail Exam: Pt has thick disfigured discolored nails with subungual debris noted 2,4,5 onleft foot and 2-5 right foot. Ulcer Exam: There is no evidence of ulcer or pre-ulcerative changes or infection. Orthopedic Exam: Muscle tone and strength are WNL. No limitations in general ROM. No crepitus or effusions noted. Foot type and digits show no abnormalities. Bony prominences are unremarkable. Skin: No Porokeratosis. No infection or ulcers  Diagnosis:  Onychomycosis, , Pain in right toe, pain in left toes  Treatment & Plan Procedures and Treatment: Consent by patient was obtained for treatment procedures.   Debridement of mycotic and hypertrophic toenails, 1 through 5 bilateral and clearing of subungual debris. No ulceration, no infection noted.  Return Visit-Office Procedure: Patient instructed to return to the office for a follow up visit 3 months for continued evaluation and treatment.    Gardiner Barefoot DPM

## 2018-02-01 DIAGNOSIS — I2721 Secondary pulmonary arterial hypertension: Secondary | ICD-10-CM | POA: Diagnosis not present

## 2018-02-02 DIAGNOSIS — J449 Chronic obstructive pulmonary disease, unspecified: Secondary | ICD-10-CM | POA: Diagnosis not present

## 2018-02-02 DIAGNOSIS — J9621 Acute and chronic respiratory failure with hypoxia: Secondary | ICD-10-CM | POA: Diagnosis not present

## 2018-02-08 DIAGNOSIS — J449 Chronic obstructive pulmonary disease, unspecified: Secondary | ICD-10-CM | POA: Diagnosis not present

## 2018-02-08 DIAGNOSIS — J9621 Acute and chronic respiratory failure with hypoxia: Secondary | ICD-10-CM | POA: Diagnosis not present

## 2018-02-14 DIAGNOSIS — J449 Chronic obstructive pulmonary disease, unspecified: Secondary | ICD-10-CM | POA: Diagnosis not present

## 2018-02-14 DIAGNOSIS — I2721 Secondary pulmonary arterial hypertension: Secondary | ICD-10-CM | POA: Diagnosis not present

## 2018-02-14 DIAGNOSIS — R0902 Hypoxemia: Secondary | ICD-10-CM | POA: Diagnosis not present

## 2018-02-14 DIAGNOSIS — I10 Essential (primary) hypertension: Secondary | ICD-10-CM | POA: Diagnosis not present

## 2018-02-18 DIAGNOSIS — I509 Heart failure, unspecified: Secondary | ICD-10-CM | POA: Diagnosis not present

## 2018-02-18 DIAGNOSIS — J449 Chronic obstructive pulmonary disease, unspecified: Secondary | ICD-10-CM | POA: Diagnosis not present

## 2018-02-18 DIAGNOSIS — R040 Epistaxis: Secondary | ICD-10-CM | POA: Diagnosis not present

## 2018-03-02 DIAGNOSIS — J9621 Acute and chronic respiratory failure with hypoxia: Secondary | ICD-10-CM | POA: Diagnosis not present

## 2018-03-02 DIAGNOSIS — J449 Chronic obstructive pulmonary disease, unspecified: Secondary | ICD-10-CM | POA: Diagnosis not present

## 2018-03-08 DIAGNOSIS — J449 Chronic obstructive pulmonary disease, unspecified: Secondary | ICD-10-CM | POA: Diagnosis not present

## 2018-03-08 DIAGNOSIS — J9621 Acute and chronic respiratory failure with hypoxia: Secondary | ICD-10-CM | POA: Diagnosis not present

## 2018-04-02 DIAGNOSIS — J449 Chronic obstructive pulmonary disease, unspecified: Secondary | ICD-10-CM | POA: Diagnosis not present

## 2018-04-02 DIAGNOSIS — J9621 Acute and chronic respiratory failure with hypoxia: Secondary | ICD-10-CM | POA: Diagnosis not present

## 2018-04-06 ENCOUNTER — Other Ambulatory Visit: Payer: Self-pay | Admitting: Interventional Radiology

## 2018-04-06 DIAGNOSIS — C642 Malignant neoplasm of left kidney, except renal pelvis: Secondary | ICD-10-CM

## 2018-04-08 DIAGNOSIS — J9621 Acute and chronic respiratory failure with hypoxia: Secondary | ICD-10-CM | POA: Diagnosis not present

## 2018-04-08 DIAGNOSIS — J449 Chronic obstructive pulmonary disease, unspecified: Secondary | ICD-10-CM | POA: Diagnosis not present

## 2018-04-12 ENCOUNTER — Other Ambulatory Visit: Payer: Self-pay | Admitting: *Deleted

## 2018-04-12 DIAGNOSIS — C642 Malignant neoplasm of left kidney, except renal pelvis: Secondary | ICD-10-CM

## 2018-04-21 DIAGNOSIS — I10 Essential (primary) hypertension: Secondary | ICD-10-CM | POA: Diagnosis not present

## 2018-04-21 DIAGNOSIS — J449 Chronic obstructive pulmonary disease, unspecified: Secondary | ICD-10-CM | POA: Diagnosis not present

## 2018-04-21 DIAGNOSIS — R0902 Hypoxemia: Secondary | ICD-10-CM | POA: Diagnosis not present

## 2018-04-21 DIAGNOSIS — I2721 Secondary pulmonary arterial hypertension: Secondary | ICD-10-CM | POA: Diagnosis not present

## 2018-04-22 ENCOUNTER — Encounter: Payer: Self-pay | Admitting: Podiatry

## 2018-04-22 ENCOUNTER — Ambulatory Visit: Payer: Medicare Other | Admitting: Podiatry

## 2018-04-22 DIAGNOSIS — M79676 Pain in unspecified toe(s): Secondary | ICD-10-CM

## 2018-04-22 DIAGNOSIS — B351 Tinea unguium: Secondary | ICD-10-CM

## 2018-04-22 DIAGNOSIS — E1142 Type 2 diabetes mellitus with diabetic polyneuropathy: Secondary | ICD-10-CM

## 2018-04-22 NOTE — Progress Notes (Signed)
Complaint:  Visit Type: Patient returns to my office for continued preventative foot care services. Complaint: Patient states" my nails have grown long and thick and become painful to walk and wear shoes" Patient has been diagnosed with DM with no foot complications. The patient presents for preventative foot care services. No changes to ROS  Podiatric Exam: Vascular: dorsalis pedis and posterior tibial pulses are palpable bilateral. Capillary return is immediate. Temperature gradient is WNL. Skin turgor WNL  Sensorium: Normal Semmes Weinstein monofilament test. Normal tactile sensation bilaterally. Nail Exam: Pt has thick disfigured discolored nails with subungual debris noted 2,4,5 onleft foot and 2-5 right foot. Ulcer Exam: There is no evidence of ulcer or pre-ulcerative changes or infection. Orthopedic Exam: Muscle tone and strength are WNL. No limitations in general ROM. No crepitus or effusions noted. Foot type and digits show no abnormalities. Bony prominences are unremarkable. Third interspace pain noted. Skin: No Porokeratosis. No infection or ulcers  Diagnosis:  Onychomycosis, , Pain in right toe, pain in left toes  Treatment & Plan Procedures and Treatment: Consent by patient was obtained for treatment procedures.   Debridement of mycotic and hypertrophic toenails, 1 through 5 bilateral and clearing of subungual debris. No ulceration, no infection noted.  Return Visit-Office Procedure: Patient instructed to return to the office for a follow up visit 3 months for continued evaluation and treatment.    Gardiner Barefoot DPM

## 2018-04-27 ENCOUNTER — Other Ambulatory Visit: Payer: Self-pay | Admitting: Interventional Radiology

## 2018-04-27 ENCOUNTER — Ambulatory Visit (HOSPITAL_COMMUNITY)
Admission: RE | Admit: 2018-04-27 | Discharge: 2018-04-27 | Disposition: A | Discharge status: Home / Self Care | Payer: Medicare Other | Source: Ambulatory Visit | Attending: Interventional Radiology | Admitting: Interventional Radiology

## 2018-04-27 ENCOUNTER — Ambulatory Visit
Admission: RE | Admit: 2018-04-27 | Discharge: 2018-04-27 | Disposition: A | Discharge status: Home / Self Care | Payer: Medicare Other | Source: Ambulatory Visit | Attending: Interventional Radiology | Admitting: Interventional Radiology

## 2018-04-27 ENCOUNTER — Encounter (HOSPITAL_COMMUNITY): Payer: Self-pay

## 2018-04-27 DIAGNOSIS — I2510 Atherosclerotic heart disease of native coronary artery without angina pectoris: Secondary | ICD-10-CM | POA: Insufficient documentation

## 2018-04-27 DIAGNOSIS — C642 Malignant neoplasm of left kidney, except renal pelvis: Secondary | ICD-10-CM | POA: Diagnosis not present

## 2018-04-27 DIAGNOSIS — Z9889 Other specified postprocedural states: Secondary | ICD-10-CM | POA: Diagnosis not present

## 2018-04-27 DIAGNOSIS — I700 Atherosclerosis of aorta: Secondary | ICD-10-CM | POA: Diagnosis not present

## 2018-04-27 DIAGNOSIS — K8020 Calculus of gallbladder without cholecystitis without obstruction: Secondary | ICD-10-CM | POA: Diagnosis not present

## 2018-04-27 DIAGNOSIS — R160 Hepatomegaly, not elsewhere classified: Secondary | ICD-10-CM | POA: Insufficient documentation

## 2018-04-27 DIAGNOSIS — N289 Disorder of kidney and ureter, unspecified: Secondary | ICD-10-CM | POA: Diagnosis not present

## 2018-04-27 HISTORY — PX: IR RADIOLOGIST EVAL & MGMT: IMG5224

## 2018-04-27 LAB — POCT I-STAT CREATININE: CREATININE: 2.2 mg/dL — AB (ref 0.44–1.00)

## 2018-04-27 NOTE — Progress Notes (Signed)
Chief Complaint: Left RCC x 2, SP cryoablation   Referring Physician(s): Kable Haywood  History of Present Illness: History: Elizabeth Thompson a 67 y.o.femalepresenting today as a scheduled follow-up to vascular interventional radiology clinic, status post CT-guided ablation with cryotherapy of 2 left-sided renal cell carcinoma.  She has been treated both on 02/05/2017, and 06/26/2017.    She is here today with her niece, who acts as her primary care-giver.   They tell me that she has been doing much better over the past few months, now that she has a diagnosis of pulmonary HTN, and has been receiving treatment.    Regarding her treatment with [place], they are quite satisfied, and she denies any hematuria or flank pain.  She has been getting surveillance CT studies of her kidneys SP treatment, with her last contrast CT on 11/01/2017.  This shows treatment changes of the left upper pole lesion, and the lower, more anterior left lesion.  No evidence of recurrence.   While she was scheduled for contrast CT today before our visit, her iStat result shows higher creatinine than usual, 2.2, with a calculated GFR of 35.  We decided to withhold contrast today, and scan as a non-con CT.    The result shows expected changes of the surgical site, with no concerning findings.  Of course, our result is limited by the absence of contrast.    I did share these results with the patient.      Past Medical History:  Diagnosis Date  . Acute respiratory failure ([place]) 06/09/2017  . Arthritis   . Back pain   . Bell's palsy   . Bipolar disorder ([place])   . Bronchitis   . Chronic low back pain 05/09/2015  . COPD (chronic obstructive pulmonary disease) ([place])    hyoxia during sleep- using O2 as needed  . Cough with expectoration 05/21/16   recent diagnosis of bronchitis  . Cyst of right kidney   . Diabetes mellitus without complication ([place])    type 2  . Frequency of urination   . GERD  (gastroesophageal reflux disease)   . Glaucoma   . History of blood transfusion   . History of colon polyps   . History of hiatal hernia   . History of tobacco use   . Hypercalcemia   . Hyperlipidemia   . Hypertension   . Memory disorder 09/05/2014  . On home oxygen therapy    3L/M [place] at night   . Schizo-affective psychosis ([place])   . Shortness of breath dyspnea    exertion, or with out oxygen  . Uterine fibroid     Past Surgical History:  Procedure Laterality Date  . ABDOMINAL HYSTERECTOMY    . APPENDECTOMY    . COLONOSCOPY W/ POLYPECTOMY    . IR GENERIC HISTORICAL  04/29/2016   IR RADIOLOGIST EVAL & MGMT 04/29/2016 Corrie Mckusick, DO [facility]  . IR GENERIC HISTORICAL  10/01/2016   IR RADIOLOGIST EVAL & MGMT 10/01/2016 [facility]  . IR GENERIC HISTORICAL  11/11/2016   IR RADIOLOGIST EVAL & MGMT 11/11/2016 Corrie Mckusick, DO [facility]  . IR GENERIC HISTORICAL  07/16/2016   IR RADIOLOGIST EVAL & MGMT 07/16/2016 Corrie Mckusick, DO [facility]  . IR RADIOLOGIST EVAL & MGMT  03/10/2017  . IR RADIOLOGIST EVAL & MGMT  05/13/2017  . IR RADIOLOGIST EVAL & MGMT  04/27/2018  . LUMBAR LAMINECTOMY/DECOMPRESSION MICRODISCECTOMY N/A 08/14/2015   Procedure: LUMBAR DECOMPRESSION MICRODISCECTOMY L3-S1;  Surgeon: Melina Schools, MD;  Location: [facility];  Service: Orthopedics;  Laterality: N/A;  . RADIOFREQUENCY ABLATION Left 02/05/2017   Procedure: LEFT RENAL CRYOABLATION;  Surgeon: Corrie Mckusick, DO;  Location: [facility];  Service: Anesthesiology;  Laterality: Left;  . RIGHT HEART CATH N/A 07/20/2017   Procedure: Right Heart Cath;  Surgeon: Nigel Mormon, MD;  Location: [facility];  Service: Cardiovascular;  Laterality: N/A;  . RIGHT/LEFT HEART CATH AND CORONARY ANGIOGRAPHY N/A 06/11/2017   Procedure: Right/Left Heart Cath and Coronary Angiography;  Surgeon: Dixie Dials, MD;  Location: [facility];  Service: Cardiovascular;  Laterality: N/A;  . TONSILLECTOMY AND  ADENOIDECTOMY      Allergies: Codeine; Nitrostat [nitroglycerin]; and Prednisone  Medications: Prior to Admission medications   Medication Sig Start Date End Date Taking? Authorizing Provider  acetaminophen (TYLENOL) 650 MG CR tablet Take 1,300 mg by mouth every 12 (twelve) hours as needed for pain.     [provider]  albuterol (PROAIR HFA) 108 (90 Base) MCG/ACT inhaler Inhale 2 puffs into the lungs every 6 (six) hours as needed for wheezing or shortness of breath.    [provider]  albuterol (PROVENTIL) (2.5 MG/3ML) 0.083% nebulizer solution Take 2.5 mg by nebulization every 4 (four) hours as needed for wheezing or shortness of breath. PLAN C    [provider]  ARIPiprazole (ABILIFY) 20 MG tablet Take 1 tablet (20 mg total) by mouth at bedtime. 08/27/14   Kerrie Buffalo, NP  ASPERCREME LIDOCAINE EX Apply 1 application topically 2 (two) times daily as needed (back pain).    [provider]  aspirin EC 81 MG EC tablet Take 1 tablet (81 mg total) by mouth daily. 06/15/17   Regalado, Belkys A, MD  atorvastatin (LIPITOR) 20 MG tablet Take 20 mg by mouth daily after supper.    [provider]  bisacodyl (DULCOLAX) 10 MG suppository Place 1 suppository (10 mg total) rectally daily as needed for moderate constipation. 09/08/16   Geradine Girt, DO  budesonide-formoterol (SYMBICORT) 160-4.5 MCG/ACT inhaler Inhale 2 puffs into the lungs 2 (two) times daily.    [provider]  Coenzyme Q10 (COQ10) 100 MG CAPS Take 100 mg by mouth every other day. Take 1 capsule by mouth every other day after supper (with atorvastatin)    [provider]  donepezil (ARICEPT) 5 MG tablet Take 1 tablet (5 mg total) by mouth at bedtime. 08/27/14   Kerrie Buffalo, NP  dorzolamide-timolol (COSOPT) 22.3-6.8 MG/ML ophthalmic solution Place 1 drop into both eyes 2 (two) times daily.    [provider]  feeding supplement (BOOST / RESOURCE BREEZE) LIQD Take  1 Container by mouth 3 (three) times daily between meals. 07/13/17   Mariel Aloe, MD  ferrous sulfate 325 (65 FE) MG tablet Take 325 mg by mouth daily after supper.    [provider]  furosemide (LASIX) 40 MG tablet Take 1 tablet (40 mg total) by mouth daily. 07/14/17   Mariel Aloe, MD  glimepiride (AMARYL) 4 MG tablet Take 0.5 tablets (2 mg total) by mouth daily with breakfast. 06/14/17   Regalado, Belkys A, MD  glucose blood (ACCU-CHEK AVIVA) test strip 1 each by Other route 2 (two) times daily. 08/04/16   Jearld Fenton, NP  Ipratropium-Albuterol (COMBIVENT RESPIMAT) 20-100 MCG/ACT AERS respimat Inhale 1-2 puffs into the lungs every 4 (four) hours as needed for wheezing. PLAN B     [provider]  lactulose ([place])  10 GM/15ML solution Take 30 mLs (20 g total) by mouth 2 (two) times daily as needed for mild constipation. 09/08/16   Geradine Girt, DO  lamoTRIgine (LAMICTAL) 100 MG tablet Take 1 tablet (100 mg total) by mouth every evening. Patient taking differently: Take 100 mg by mouth daily after supper.  08/27/14   Kerrie Buffalo, NP  latanoprost (XALATAN) 0.005 % ophthalmic solution Place 1 drop into both eyes at bedtime. 08/27/14   Kerrie Buffalo, NP  Magnesium 250 MG TABS Take 250 mg by mouth See admin instructions. Take 1 tablet (250 mg) by mouth every other day after supper    [provider]  Menthol, Topical Analgesic, (BIOFREEZE EX) Apply 1 application topically 2 (two) times daily as needed (back pain).    [provider]  metFORMIN (GLUCOPHAGE) 1000 MG tablet Take 1 tablet (1,000 mg total) by mouth daily with breakfast. Patient taking differently: Take 1,000 mg by mouth daily with breakfast. If not eating well, give 500 mg twice daily instead of 1024m daily 07/13/17   NMariel Aloe MD  OXYGEN Inhale 5 L into the lungs See admin instructions.     [provider]  Phenylephrine-APAP-Guaifenesin (MUCINEX SINUS-MAX) 10-650-400  MG/20ML LIQD Take 20 mLs by mouth 2 (two) times daily.    [provider]  polyethylene glycol (MIRALAX / GLYCOLAX) packet Take 17 g by mouth daily. Patient taking differently: Take 17 g by mouth daily as needed (for constipation). Mix in 4-8 oz liquid and drink 09/08/16   VGeradine Girt DO  Respiratory Therapy Supplies (FLUTTER) DEVI Use as directed. 05/26/17   Parrett, TFonnie Mu NP  Selenium Sulf-Pyrithione-Urea 2.25 % SHAM Apply 1 application topically See admin instructions. Apply topically to scalp for dermatitis once every 14 days 01/25/15   [provider]  senna (SENOKOT) 8.6 MG tablet Take 1 tablet by mouth daily as needed for constipation.     [provider]  sildenafil (REVATIO) 20 MG tablet Take 1 tablet (20 mg total) by mouth 3 (three) times daily. 06/14/17   Regalado, BCassie Freer MD     Family History  Problem Relation Age of Onset  . Dementia Mother   . Alzheimer's disease Mother   . Heart disease Mother   . Hypertension Mother   . Diabetes Mother   . Diabetes Father   . Alzheimer's disease Sister   . Heart disease Brother   . Heart attack Brother   . Bladder Cancer Neg Hx   . Kidney cancer Neg Hx   . Prostate cancer Neg Hx     Social History   Socioeconomic History  . Marital status: Divorced    Spouse name: Not on file  . Number of children: 1  . Years of education: college 4  . Highest education level: Not on file  Occupational History  . Occupation: disabled  Social Needs  . Financial resource strain: Not on file  . Food insecurity:    Worry: Not on file    Inability: Not on file  . Transportation needs:    Medical: Not on file    Non-medical: Not on file  Tobacco Use  . Smoking status: Former Smoker    Packs/day: 1.00    Years: 28.00    Pack years: 28.00    Types: Cigarettes    Last attempt to quit: 07/15/2015    Years since quitting: 2.7  . Smokeless tobacco: Never Used  Substance and Sexual Activity  . Alcohol use: No  Alcohol/week: 0.0 oz  . Drug use: No  . Sexual activity: Never    Birth control/protection: None  Lifestyle  . Physical activity:    Days per week: Not on file    Minutes per session: Not on file  . Stress: Not on file  Relationships  . Social connections:    Talks on phone: Not on file    Gets together: Not on file    Attends religious service: Not on file    Active member of club or organization: Not on file    Attends meetings of clubs or organizations: Not on file    Relationship status: Not on file  Other Topics Concern  . Not on file  Social History Narrative   Patient is right handed.   Patient drinks 2 cups caffeine daily.    Review of Systems: A 12 point ROS discussed and pertinent positives are indicated in the HPI above.  All other systems are negative.  Review of Systems  Vital Signs: BP (!) 101/50   Pulse (!) 101   Temp (!) 97.5 F (36.4 C) (Oral)   Resp 15   SpO2 94%   Physical Exam General: 67 yo female appearing  stated age.  Well-developed, well-nourished.  No distress. HEENT: Atraumatic, normocephalic.  Conjugate gaze, extra-ocular motor intact. No scleral icterus or scleral injection. No lesions on external ears, nose, lips, or gums.  Oral mucosa moist, pink.  Neck: Symmetric with no goiter enlargement.  Chest/Lungs:  Nasal cannula for O2.  Symmetric chest with inspiration/expiration.  No labored breathing.    Heart:   No JVD appreciated.  Abdomen:  Soft, obsese NT/ND  Genito-urinary: Deferred Neurologic: Alert & Oriented to person, place, and time.   Normal affect and insight.  Appropriate questions.  Moving all 4 extremities with gross sensory intact. She is using a wheelchair in the clinic, and typically at least a cane.   Imaging: Ct Abdomen Wo Contrast  Result Date: 04/27/2018 CLINICAL DATA:  Left renal clear cell carcinoma with ablation. EXAM: CT ABDOMEN WITHOUT CONTRAST TECHNIQUE: Multidetector CT imaging of the abdomen was performed  following the standard protocol without IV contrast. COMPARISON:  11/01/2017. FINDINGS: Lower chest: Lung bases show no acute findings. Heart is at the upper limits of normal in size. No pericardial or pleural effusion. Coronary artery calcification. Tiny hiatal hernia. Hepatobiliary: Subcentimeter low-attenuation lesion in the left kidney is too small to characterize. Liver measures 19.0 cm. Stones layer in the gallbladder. No biliary ductal dilatation. Pancreas: Negative. Spleen: Negative. Adrenals/Urinary Tract: Adrenal glands are unremarkable. Multiple high and low-attenuation lesions are again seen in the kidneys measuring up to 3.0 cm on the right, similar. Further characterization is limited due to size and/or lack of post-contrast imaging. A mildly complex lesion off the upper pole left kidney measures 2.0 x 2.1 cm, has internal calcification, was previously ablated per report and is stable in size from 11/01/2017. No urinary stones. Stomach/Bowel: Tiny hiatal hernia. Stomach and visualized portions of the small bowel and colon are unremarkable. Vascular/Lymphatic: Atherosclerotic calcification of the arterial vasculature without abdominal aortic aneurysm. No pathologically enlarged lymph nodes. Other: No free fluid.  Mesenteries and peritoneum are unremarkable. Musculoskeletal: Degenerative changes in the spine. No worrisome lytic or sclerotic lesions. IMPRESSION: 1. Complex lesion off the upper pole left kidney is stable in size, post ablation. 2. Additional evaluation of high and low-attenuation lesions in both kidneys is limited by lack of post-contrast imaging and/or size. 3. Aortic atherosclerosis (ICD10-170.0). Coronary artery  calcification. 4. Mild hepatomegaly. 5. Cholelithiasis. Electronically Signed   By: Lorin Picket M.D.   On: 04/27/2018 11:47   Ir Radiologist Eval & Mgmt  Result Date: 04/27/2018 Please refer to notes tab for details about interventional procedure. (Op  Note)   Labs:  CBC: Recent Labs    07/02/17 1431 07/03/17 0247 07/08/17 0316 07/20/17 1304  WBC 4.7 4.9 8.0 6.0  HGB 11.1* 10.3* 9.8* 10.5*  HCT 34.1* 32.3* 31.0* 34.1*  PLT 382 385 359 558*    COAGS: Recent Labs    07/20/17 1340  INR 0.94    BMP: Recent Labs    07/10/17 0344 07/11/17 0534 07/12/17 0810 07/20/17 1340 04/27/18 1017  NA 133* 136 137 136  --   K 4.1 4.0 4.2 3.5  --   CL 101 103 102 103  --   CO2 _0 --   GLUCOSE 128* 127* 115* 97  --   BUN 31* 34* 30* 19  --   CALCIUM 9.6 9.8 10.1 9.8  --   CREATININE 1.94* 1.80* 1.72* 1.77* 2.20*  GFRNONAA 26* 28* 30* 29*  --   GFRAA 30* 33* 35* 33*  --     LIVER FUNCTION TESTS: Recent Labs    06/13/17 0354 06/14/17 0631 07/02/17 1431 07/03/17 0247 07/09/17 0443  BILITOT 1.2  --  0.4 0.4 0.5  AST 28  --  33 31 26  ALT 28  --  _1 ALKPHOS 56  --  63 56 73  PROT 6.4*  --  6.9 6.3* 6.4*  ALBUMIN 3.4* 3.9 3.8 3.4* 2.9*    TUMOR MARKERS: No results for input(s): AFPTM, CEA, CA199, CHROMGRNA in the last 8760 hours.  Assessment and Plan:  Ms Tangen is a 67 year old female with history of 2 treated left RCC, both treated with CT guided tissue ablation with cryotherapy.    She has continued CT surveillance imaging today which shows expected treatment changes.  The study today is somewhat limited for a comprehensive assessment of the kidneys given the absence of contrast.   She has ongoing renal insufficiency.  This is an obstacle for contrast enhanced imaging.  I had a lengthy discussion with her and her niece about our imaging strategy, and the desire to perform CT or MR with contrast.    I think it would be reasonable to have her follow up in 1 year from today's date with another office visit.  We will plan on contrast CT of the abdomen.  If there are again problems with insufficiency in the future, we may need to find alternative strategy.  For now, the plan will be for adequate hydration,  anticipating a reduced contrast dose on the future CT.    They understand our plan.    Plan: - 1 year follow up clinic visit, with contrast enhanced CT of the abdomen.  - I have encouraged her to observe her future appointments   Electronically Signed: Corrie Mckusick 04/27/2018, 2:40 PM   I spent a total of    25 Minutes in face to face in clinical consultation, greater than 50% of which was counseling/coordinating care for left RCC, SP cryoablation of 2 tumors.

## 2018-05-02 DIAGNOSIS — J449 Chronic obstructive pulmonary disease, unspecified: Secondary | ICD-10-CM | POA: Diagnosis not present

## 2018-05-02 DIAGNOSIS — J9621 Acute and chronic respiratory failure with hypoxia: Secondary | ICD-10-CM | POA: Diagnosis not present

## 2018-05-08 DIAGNOSIS — J9621 Acute and chronic respiratory failure with hypoxia: Secondary | ICD-10-CM | POA: Diagnosis not present

## 2018-05-08 DIAGNOSIS — J449 Chronic obstructive pulmonary disease, unspecified: Secondary | ICD-10-CM | POA: Diagnosis not present

## 2018-05-13 ENCOUNTER — Ambulatory Visit: Payer: Medicare Other | Admitting: Urology

## 2018-05-13 NOTE — Progress Notes (Deleted)
05/13/2018 8:09 AM   Rosine Beat [birth year] [id number]  Referring provider: Everardo Beals, NP [facility] [address] [place], [place] [address]  No chief complaint on file.   HPI:    PMH: Past Medical History:  Diagnosis Date  . Acute respiratory failure ([place]) 06/09/2017  . Arthritis   . Back pain   . Bell's palsy   . Bipolar disorder ([place])   . Bronchitis   . Chronic low back pain 05/09/2015  . COPD (chronic obstructive pulmonary disease) ([place])    hyoxia during sleep- using O2 as needed  . Cough with expectoration 05/21/16   recent diagnosis of bronchitis  . Cyst of right kidney   . Diabetes mellitus without complication ([place])    type 2  . Frequency of urination   . GERD (gastroesophageal reflux disease)   . Glaucoma   . History of blood transfusion   . History of colon polyps   . History of hiatal hernia   . History of tobacco use   . Hypercalcemia   . Hyperlipidemia   . Hypertension   . Memory disorder 09/05/2014  . On home oxygen therapy    3L/M [place] at night   . Schizo-affective psychosis ([place])   . Shortness of breath dyspnea    exertion, or with out oxygen  . Uterine fibroid     Surgical History: Past Surgical History:  Procedure Laterality Date  . ABDOMINAL HYSTERECTOMY    . APPENDECTOMY    . COLONOSCOPY W/ POLYPECTOMY    . IR GENERIC HISTORICAL  04/29/2016   IR RADIOLOGIST EVAL & MGMT 04/29/2016 Corrie Mckusick, DO [facility] INTER[facility] RAD  . IR GENERIC HISTORICAL  10/01/2016   IR RADIOLOGIST EVAL & MGMT 10/01/2016 [facility] INTER[facility]  . IR GENERIC HISTORICAL  11/11/2016   IR RADIOLOGIST EVAL & MGMT 11/11/2016 Corrie Mckusick, DO [facility]  . IR GENERIC HISTORICAL  07/16/2016   IR RADIOLOGIST EVAL & MGMT 07/16/2016 Corrie Mckusick, DO [facility] INTER[facility]  . IR RADIOLOGIST EVAL & MGMT  03/10/2017  . IR RADIOLOGIST EVAL & MGMT  05/13/2017  . IR RADIOLOGIST EVAL & MGMT  04/27/2018  . LUMBAR LAMINECTOMY/DECOMPRESSION MICRODISCECTOMY N/A  08/14/2015   Procedure: LUMBAR DECOMPRESSION MICRODISCECTOMY L3-S1;  Surgeon: Melina Schools, MD;  Location: [facility];  Service: Orthopedics;  Laterality: N/A;  . RADIOFREQUENCY ABLATION Left 02/05/2017   Procedure: LEFT RENAL CRYOABLATION;  Surgeon: Corrie Mckusick, DO;  Location: [facility];  Service: Anesthesiology;  Laterality: Left;  . RIGHT HEART CATH N/A 07/20/2017   Procedure: Right Heart Cath;  Surgeon: Nigel Mormon, MD;  Location: [place] [facility];  Service: Cardiovascular;  Laterality: N/A;  . RIGHT/LEFT HEART CATH AND CORONARY ANGIOGRAPHY N/A 06/11/2017   Procedure: Right/Left Heart Cath and Coronary Angiography;  Surgeon: Dixie Dials, MD;  Location: [facility];  Service: Cardiovascular;  Laterality: N/A;  . TONSILLECTOMY AND ADENOIDECTOMY      Home Medications:  Allergies as of 05/13/2018      Reactions   Codeine Nausea And Vomiting   Nitrostat [nitroglycerin] Other (See Comments)   Pt is on revatio   Prednisone Other (See Comments)   Has to monitor because she is a diabetic       Medication List        Accurate as of 05/13/18  8:09 AM. Always use your most recent med list.          acetaminophen 650 MG CR tablet Commonly known  as:  TYLENOL Take 1,300 mg by mouth every 12 (twelve) hours as needed for pain.   albuterol (2.5 MG/3ML) 0.083% nebulizer solution Commonly known as:  PROVENTIL Take 2.5 mg by nebulization every 4 (four) hours as needed for wheezing or shortness of breath. PLAN C   PROAIR HFA 108 (90 Base) MCG/ACT inhaler Generic drug:  albuterol Inhale 2 puffs into the lungs every 6 (six) hours as needed for wheezing or shortness of breath.   ARIPiprazole 20 MG tablet Commonly known as:  ABILIFY Take 1 tablet (20 mg total) by mouth at bedtime.   ASPERCREME LIDOCAINE EX Apply 1 application topically 2 (two) times daily as needed (back pain).   aspirin 81 MG EC tablet Take 1 tablet (81 mg total) by mouth daily.   atorvastatin 20 MG  tablet Commonly known as:  LIPITOR Take 20 mg by mouth daily after supper.   BIOFREEZE EX Apply 1 application topically 2 (two) times daily as needed (back pain).   bisacodyl 10 MG suppository Commonly known as:  DULCOLAX Place 1 suppository (10 mg total) rectally daily as needed for moderate constipation.   budesonide-formoterol 160-4.5 MCG/ACT inhaler Commonly known as:  SYMBICORT Inhale 2 puffs into the lungs 2 (two) times daily.   COMBIVENT RESPIMAT 20-100 MCG/ACT Aers respimat Generic drug:  Ipratropium-Albuterol Inhale 1-2 puffs into the lungs every 4 (four) hours as needed for wheezing. PLAN B   CoQ10 100 MG Caps Take 100 mg by mouth every other day. Take 1 capsule by mouth every other day after supper (with atorvastatin)   donepezil 5 MG tablet Commonly known as:  ARICEPT Take 1 tablet (5 mg total) by mouth at bedtime.   dorzolamide-timolol 22.3-6.8 MG/ML ophthalmic solution Commonly known as:  COSOPT Place 1 drop into both eyes 2 (two) times daily.   feeding supplement Liqd Take 1 Container by mouth 3 (three) times daily between meals.   ferrous sulfate 325 (65 FE) MG tablet Take 325 mg by mouth daily after supper.   FLUTTER Devi Use as directed.   furosemide 40 MG tablet Commonly known as:  LASIX Take 1 tablet (40 mg total) by mouth daily.   glimepiride 4 MG tablet Commonly known as:  AMARYL Take 0.5 tablets (2 mg total) by mouth daily with breakfast.   glucose blood test strip Commonly known as:  ACCU-CHEK AVIVA 1 each by Other route 2 (two) times daily.   lactulose 10 GM/15ML solution Commonly known as:  CHRONULAC Take 30 mLs (20 g total) by mouth 2 (two) times daily as needed for mild constipation.   lamoTRIgine 100 MG tablet Commonly known as:  LAMICTAL Take 1 tablet (100 mg total) by mouth every evening.   latanoprost 0.005 % ophthalmic solution Commonly known as:  XALATAN Place 1 drop into both eyes at bedtime.   Magnesium 250 MG  Tabs Take 250 mg by mouth See admin instructions. Take 1 tablet (250 mg) by mouth every other day after supper   metFORMIN 1000 MG tablet Commonly known as:  GLUCOPHAGE Take 1 tablet (1,000 mg total) by mouth daily with breakfast.   MUCINEX SINUS-MAX 10-650-400 MG/20ML Liqd Generic drug:  Phenylephrine-APAP-guaiFENesin Take 20 mLs by mouth 2 (two) times daily.   OXYGEN Inhale 5 L into the lungs See admin instructions.   polyethylene glycol packet Commonly known as:  MIRALAX / GLYCOLAX Take 17 g by mouth daily.   Selenium Sulf-Pyrithione-Urea 2.25 % Sham Apply 1 application topically See admin instructions. Apply topically to scalp  for dermatitis once every 14 days   senna 8.6 MG tablet Commonly known as:  SENOKOT Take 1 tablet by mouth daily as needed for constipation.   sildenafil 20 MG tablet Commonly known as:  REVATIO Take 1 tablet (20 mg total) by mouth 3 (three) times daily.       Allergies:  Allergies  Allergen Reactions  . Codeine Nausea And Vomiting  . Nitrostat [Nitroglycerin] Other (See Comments)    Pt is on revatio  . Prednisone Other (See Comments)    Has to monitor because she is a diabetic     Family History: Family History  Problem Relation Age of Onset  . Dementia Mother   . Alzheimer's disease Mother   . Heart disease Mother   . Hypertension Mother   . Diabetes Mother   . Diabetes Father   . Alzheimer's disease Sister   . Heart disease Brother   . Heart attack Brother   . Bladder Cancer Neg Hx   . Kidney cancer Neg Hx   . Prostate cancer Neg Hx     Social History:  reports that she quit smoking about 2 years ago. Her smoking use included cigarettes. She has a 28.00 pack-year smoking history. She has never used smokeless tobacco. She reports that she does not drink alcohol or use drugs.  ROS:                                        Physical Exam: There were no vitals taken for this visit.  Constitutional:  Alert  and oriented, No acute distress. HEENT: [place] AT, moist mucus membranes.  Trachea midline, no masses. Cardiovascular: No clubbing, cyanosis, or edema. Respiratory: Normal respiratory effort, no increased work of breathing. GI: Abdomen is soft, nontender, nondistended, no abdominal masses GU: No CVA tenderness Lymph: No cervical or inguinal lymphadenopathy. Skin: No rashes, bruises or suspicious lesions. Neurologic: Grossly intact, no focal deficits, moving all 4 extremities. Psychiatric: Normal mood and affect.  Laboratory Data: Lab Results  Component Value Date   WBC 6.0 07/20/2017   HGB 10.5 (L) 07/20/2017   HCT 34.1 (L) 07/20/2017   MCV 78.6 07/20/2017   PLT 558 (H) 07/20/2017    Lab Results  Component Value Date   CREATININE 2.20 (H) 04/27/2018    No results found for: PSA  No results found for: TESTOSTERONE  Lab Results  Component Value Date   HGBA1C 5.4 02/02/2017    Urinalysis    Component Value Date/Time   COLORURINE YELLOW 07/02/2017 1825   APPEARANCEUR CLEAR 07/02/2017 1825   LABSPEC 1.006 07/02/2017 1825   PHURINE 5.0 07/02/2017 1825   GLUCOSEU NEGATIVE 07/02/2017 1825   HGBUR NEGATIVE 07/02/2017 1825   BILIRUBINUR NEGATIVE 07/02/2017 1825   KETONESUR NEGATIVE 07/02/2017 1825   PROTEINUR 30 (A) 07/02/2017 1825   UROBILINOGEN 0.2 08/17/2015 0109   NITRITE NEGATIVE 07/02/2017 1825   LEUKOCYTESUR NEGATIVE 07/02/2017 1825    Lab Results  Component Value Date   BACTERIA NONE SEEN 07/02/2017    Pertinent Imaging: *** No results found for this or any previous visit. No results found for this or any previous visit. No results found for this or any previous visit. No results found for this or any previous visit. No results found for this or any previous visit. No results found for this or any previous visit. No results found for this or any previous  visit. No results found for this or any previous visit.  Assessment & Plan:    1. Clear cell renal  cell carcinoma, left (HCC) *** - Basic metabolic panel; Future  2. CKD (chronic kidney disease) stage 3, GFR 30-59 ml/min (HCC) *** - Basic metabolic panel; Future   No follow-ups on file.  Hollice Espy, MD  [facility] [address], [place], [place] [address] [phone]

## 2018-06-02 DIAGNOSIS — J9621 Acute and chronic respiratory failure with hypoxia: Secondary | ICD-10-CM | POA: Diagnosis not present

## 2018-06-02 DIAGNOSIS — J449 Chronic obstructive pulmonary disease, unspecified: Secondary | ICD-10-CM | POA: Diagnosis not present

## 2018-06-08 DIAGNOSIS — J449 Chronic obstructive pulmonary disease, unspecified: Secondary | ICD-10-CM | POA: Diagnosis not present

## 2018-06-08 DIAGNOSIS — J9621 Acute and chronic respiratory failure with hypoxia: Secondary | ICD-10-CM | POA: Diagnosis not present

## 2018-07-02 DIAGNOSIS — J449 Chronic obstructive pulmonary disease, unspecified: Secondary | ICD-10-CM | POA: Diagnosis not present

## 2018-07-02 DIAGNOSIS — J9621 Acute and chronic respiratory failure with hypoxia: Secondary | ICD-10-CM | POA: Diagnosis not present

## 2018-07-08 DIAGNOSIS — J9621 Acute and chronic respiratory failure with hypoxia: Secondary | ICD-10-CM | POA: Diagnosis not present

## 2018-07-08 DIAGNOSIS — J449 Chronic obstructive pulmonary disease, unspecified: Secondary | ICD-10-CM | POA: Diagnosis not present

## 2018-07-27 DIAGNOSIS — R0902 Hypoxemia: Secondary | ICD-10-CM | POA: Diagnosis not present

## 2018-07-27 DIAGNOSIS — J449 Chronic obstructive pulmonary disease, unspecified: Secondary | ICD-10-CM | POA: Diagnosis not present

## 2018-07-27 DIAGNOSIS — I10 Essential (primary) hypertension: Secondary | ICD-10-CM | POA: Diagnosis not present

## 2018-07-27 DIAGNOSIS — I2721 Secondary pulmonary arterial hypertension: Secondary | ICD-10-CM | POA: Diagnosis not present

## 2018-07-29 ENCOUNTER — Ambulatory Visit (INDEPENDENT_AMBULATORY_CARE_PROVIDER_SITE_OTHER): Payer: Medicare Other | Admitting: Podiatry

## 2018-07-29 ENCOUNTER — Encounter: Payer: Self-pay | Admitting: Podiatry

## 2018-07-29 DIAGNOSIS — E1142 Type 2 diabetes mellitus with diabetic polyneuropathy: Secondary | ICD-10-CM | POA: Diagnosis not present

## 2018-07-29 DIAGNOSIS — B351 Tinea unguium: Secondary | ICD-10-CM

## 2018-07-29 DIAGNOSIS — M79676 Pain in unspecified toe(s): Secondary | ICD-10-CM | POA: Diagnosis not present

## 2018-07-29 NOTE — Progress Notes (Signed)
Complaint:  Visit Type: Patient returns to my office for continued preventative foot care services. Complaint: Patient states" my nails have grown long and thick and become painful to walk and wear shoes" Patient has been diagnosed with DM with no foot complications. The patient presents for preventative foot care services. No changes to ROS  Podiatric Exam: Vascular: dorsalis pedis and posterior tibial pulses are palpable bilateral. Capillary return is immediate. Temperature gradient is WNL. Skin turgor WNL  Sensorium: Normal Semmes Weinstein monofilament test. Normal tactile sensation bilaterally. Nail Exam: Pt has thick disfigured discolored nails with subungual debris noted 2,4,5 onleft foot and 2-5 right foot. Ulcer Exam: There is no evidence of ulcer or pre-ulcerative changes or infection. Orthopedic Exam: Muscle tone and strength are WNL. No limitations in general ROM. No crepitus or effusions noted. Foot type and digits show no abnormalities. Bony prominences are unremarkable. Third interspace pain noted. Skin: No Porokeratosis. No infection or ulcers  Diagnosis:  Onychomycosis, , Pain in right toe, pain in left toes  Treatment & Plan Procedures and Treatment: Consent by patient was obtained for treatment procedures.   Debridement of mycotic and hypertrophic toenails, 1 through 5 bilateral and clearing of subungual debris. No ulceration, no infection noted.  Return Visit-Office Procedure: Patient instructed to return to the office for a follow up visit 3 months for continued evaluation and treatment.    Gardiner Barefoot DPM

## 2018-07-30 DIAGNOSIS — R040 Epistaxis: Secondary | ICD-10-CM | POA: Diagnosis not present

## 2018-07-30 DIAGNOSIS — I10 Essential (primary) hypertension: Secondary | ICD-10-CM | POA: Diagnosis not present

## 2018-07-30 DIAGNOSIS — E785 Hyperlipidemia, unspecified: Secondary | ICD-10-CM | POA: Diagnosis not present

## 2018-07-30 DIAGNOSIS — E119 Type 2 diabetes mellitus without complications: Secondary | ICD-10-CM | POA: Diagnosis not present

## 2018-07-31 DIAGNOSIS — E119 Type 2 diabetes mellitus without complications: Secondary | ICD-10-CM | POA: Diagnosis not present

## 2018-08-02 DIAGNOSIS — J449 Chronic obstructive pulmonary disease, unspecified: Secondary | ICD-10-CM | POA: Diagnosis not present

## 2018-08-02 DIAGNOSIS — J9621 Acute and chronic respiratory failure with hypoxia: Secondary | ICD-10-CM | POA: Diagnosis not present

## 2018-08-04 DIAGNOSIS — E119 Type 2 diabetes mellitus without complications: Secondary | ICD-10-CM | POA: Diagnosis not present

## 2018-08-04 DIAGNOSIS — H401133 Primary open-angle glaucoma, bilateral, severe stage: Secondary | ICD-10-CM | POA: Diagnosis not present

## 2018-08-08 DIAGNOSIS — J449 Chronic obstructive pulmonary disease, unspecified: Secondary | ICD-10-CM | POA: Diagnosis not present

## 2018-08-08 DIAGNOSIS — J9621 Acute and chronic respiratory failure with hypoxia: Secondary | ICD-10-CM | POA: Diagnosis not present

## 2018-09-02 DIAGNOSIS — J9621 Acute and chronic respiratory failure with hypoxia: Secondary | ICD-10-CM | POA: Diagnosis not present

## 2018-09-02 DIAGNOSIS — J449 Chronic obstructive pulmonary disease, unspecified: Secondary | ICD-10-CM | POA: Diagnosis not present

## 2018-09-08 DIAGNOSIS — J449 Chronic obstructive pulmonary disease, unspecified: Secondary | ICD-10-CM | POA: Diagnosis not present

## 2018-09-08 DIAGNOSIS — J9621 Acute and chronic respiratory failure with hypoxia: Secondary | ICD-10-CM | POA: Diagnosis not present

## 2018-10-02 DIAGNOSIS — J449 Chronic obstructive pulmonary disease, unspecified: Secondary | ICD-10-CM | POA: Diagnosis not present

## 2018-10-02 DIAGNOSIS — J9621 Acute and chronic respiratory failure with hypoxia: Secondary | ICD-10-CM | POA: Diagnosis not present

## 2018-10-08 DIAGNOSIS — J449 Chronic obstructive pulmonary disease, unspecified: Secondary | ICD-10-CM | POA: Diagnosis not present

## 2018-10-08 DIAGNOSIS — J9621 Acute and chronic respiratory failure with hypoxia: Secondary | ICD-10-CM | POA: Diagnosis not present

## 2018-10-25 DIAGNOSIS — I10 Essential (primary) hypertension: Secondary | ICD-10-CM | POA: Diagnosis not present

## 2018-10-25 DIAGNOSIS — D509 Iron deficiency anemia, unspecified: Secondary | ICD-10-CM | POA: Diagnosis not present

## 2018-11-02 DIAGNOSIS — J449 Chronic obstructive pulmonary disease, unspecified: Secondary | ICD-10-CM | POA: Diagnosis not present

## 2018-11-02 DIAGNOSIS — J9621 Acute and chronic respiratory failure with hypoxia: Secondary | ICD-10-CM | POA: Diagnosis not present

## 2018-11-04 ENCOUNTER — Ambulatory Visit: Payer: Medicare Other | Admitting: Podiatry

## 2018-11-08 DIAGNOSIS — J9621 Acute and chronic respiratory failure with hypoxia: Secondary | ICD-10-CM | POA: Diagnosis not present

## 2018-11-08 DIAGNOSIS — J449 Chronic obstructive pulmonary disease, unspecified: Secondary | ICD-10-CM | POA: Diagnosis not present

## 2018-11-29 DIAGNOSIS — I272 Pulmonary hypertension, unspecified: Secondary | ICD-10-CM | POA: Diagnosis not present

## 2018-12-02 DIAGNOSIS — J449 Chronic obstructive pulmonary disease, unspecified: Secondary | ICD-10-CM | POA: Diagnosis not present

## 2018-12-02 DIAGNOSIS — J9621 Acute and chronic respiratory failure with hypoxia: Secondary | ICD-10-CM | POA: Diagnosis not present

## 2018-12-05 DIAGNOSIS — R0902 Hypoxemia: Secondary | ICD-10-CM | POA: Diagnosis not present

## 2018-12-05 DIAGNOSIS — I10 Essential (primary) hypertension: Secondary | ICD-10-CM | POA: Diagnosis not present

## 2018-12-05 DIAGNOSIS — J449 Chronic obstructive pulmonary disease, unspecified: Secondary | ICD-10-CM | POA: Diagnosis not present

## 2018-12-05 DIAGNOSIS — I2721 Secondary pulmonary arterial hypertension: Secondary | ICD-10-CM | POA: Diagnosis not present

## 2018-12-08 DIAGNOSIS — J449 Chronic obstructive pulmonary disease, unspecified: Secondary | ICD-10-CM | POA: Diagnosis not present

## 2018-12-08 DIAGNOSIS — J9621 Acute and chronic respiratory failure with hypoxia: Secondary | ICD-10-CM | POA: Diagnosis not present

## 2018-12-09 ENCOUNTER — Encounter: Payer: Self-pay | Admitting: Podiatry

## 2018-12-09 ENCOUNTER — Ambulatory Visit (INDEPENDENT_AMBULATORY_CARE_PROVIDER_SITE_OTHER): Payer: Medicare Other | Admitting: Podiatry

## 2018-12-09 DIAGNOSIS — M79676 Pain in unspecified toe(s): Secondary | ICD-10-CM

## 2018-12-09 DIAGNOSIS — E1142 Type 2 diabetes mellitus with diabetic polyneuropathy: Secondary | ICD-10-CM | POA: Diagnosis not present

## 2018-12-09 DIAGNOSIS — B351 Tinea unguium: Secondary | ICD-10-CM | POA: Diagnosis not present

## 2018-12-09 NOTE — Progress Notes (Signed)
Complaint:  Visit Type: Patient returns to my office for continued preventative foot care services. Complaint: Patient states" my nails have grown long and thick and become painful to walk and wear shoes" Patient has been diagnosed with DM with no foot complications. The patient presents for preventative foot care services. No changes to ROS  Podiatric Exam: Vascular: dorsalis pedis and posterior tibial pulses are palpable bilateral. Capillary return is immediate. Temperature gradient is WNL. Skin turgor WNL  Sensorium: Normal Semmes Weinstein monofilament test. Normal tactile sensation bilaterally. Nail Exam: Pt has thick disfigured discolored nails with subungual debris noted 2,4,5 onleft foot and 2-5 right foot. Ulcer Exam: There is no evidence of ulcer or pre-ulcerative changes or infection. Orthopedic Exam: Muscle tone and strength are WNL. No limitations in general ROM. No crepitus or effusions noted. Foot type and digits show no abnormalities. Bony prominences are unremarkable. Third interspace pain noted. Skin: No Porokeratosis. No infection or ulcers  Diagnosis:  Onychomycosis, , Pain in right toe, pain in left toes  Treatment & Plan Procedures and Treatment: Consent by patient was obtained for treatment procedures.   Debridement of mycotic and hypertrophic toenails, 1 through 5 bilateral and clearing of subungual debris. No ulceration, no infection noted.  Return Visit-Office Procedure: Patient instructed to return to the office for a follow up visit 3 months for continued evaluation and treatment.    Gardiner Barefoot DPM

## 2018-12-24 IMAGING — CR DG CHEST 2V
2 series · 2 of 2 positions shown · non-contrast
Comparison: 05/04/2017 chest radiograph and prior studies.
06/09/2017 chest CT

CLINICAL DATA: Shortness of breath

EXAM:
CHEST  2 VIEW

[chest pa]
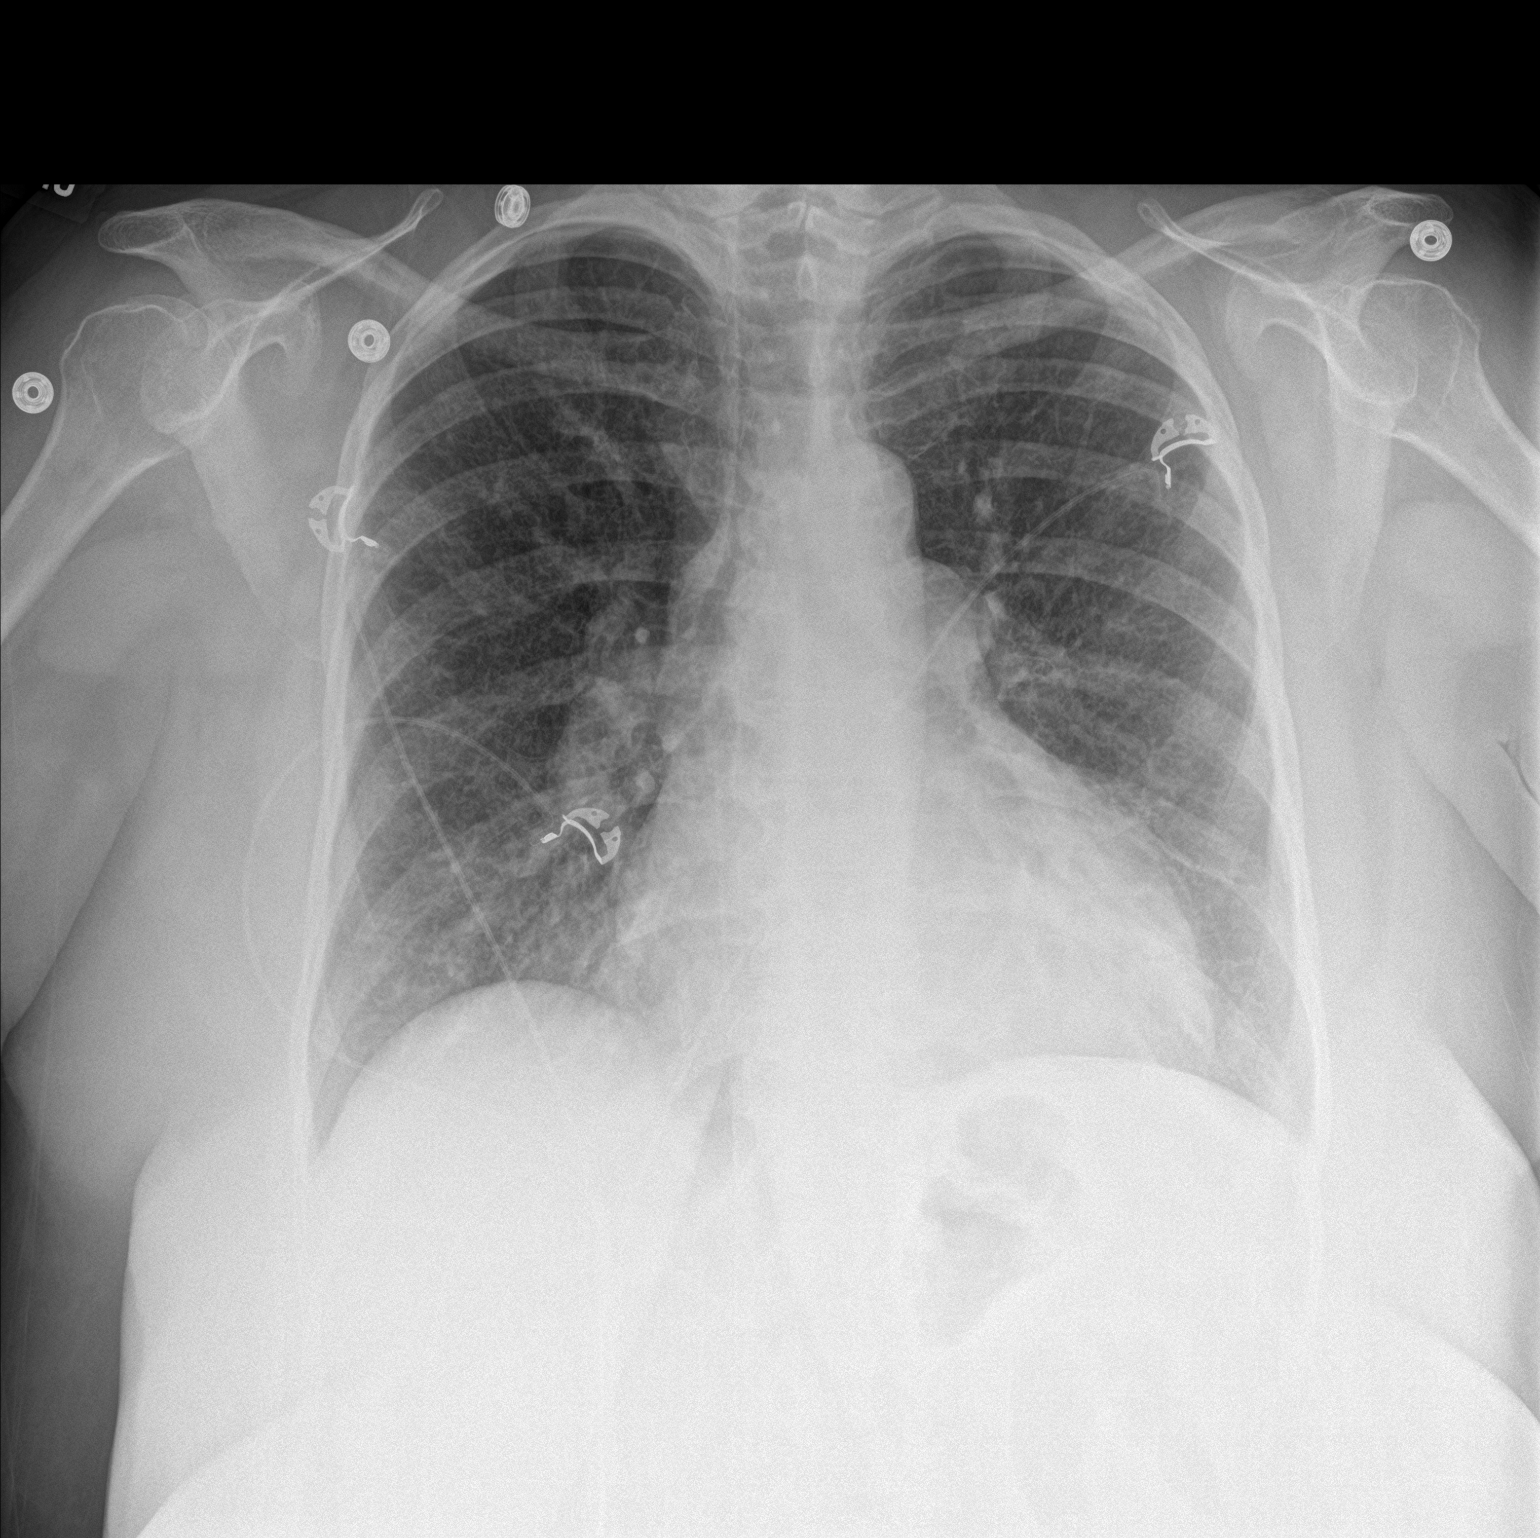

[chest lat]
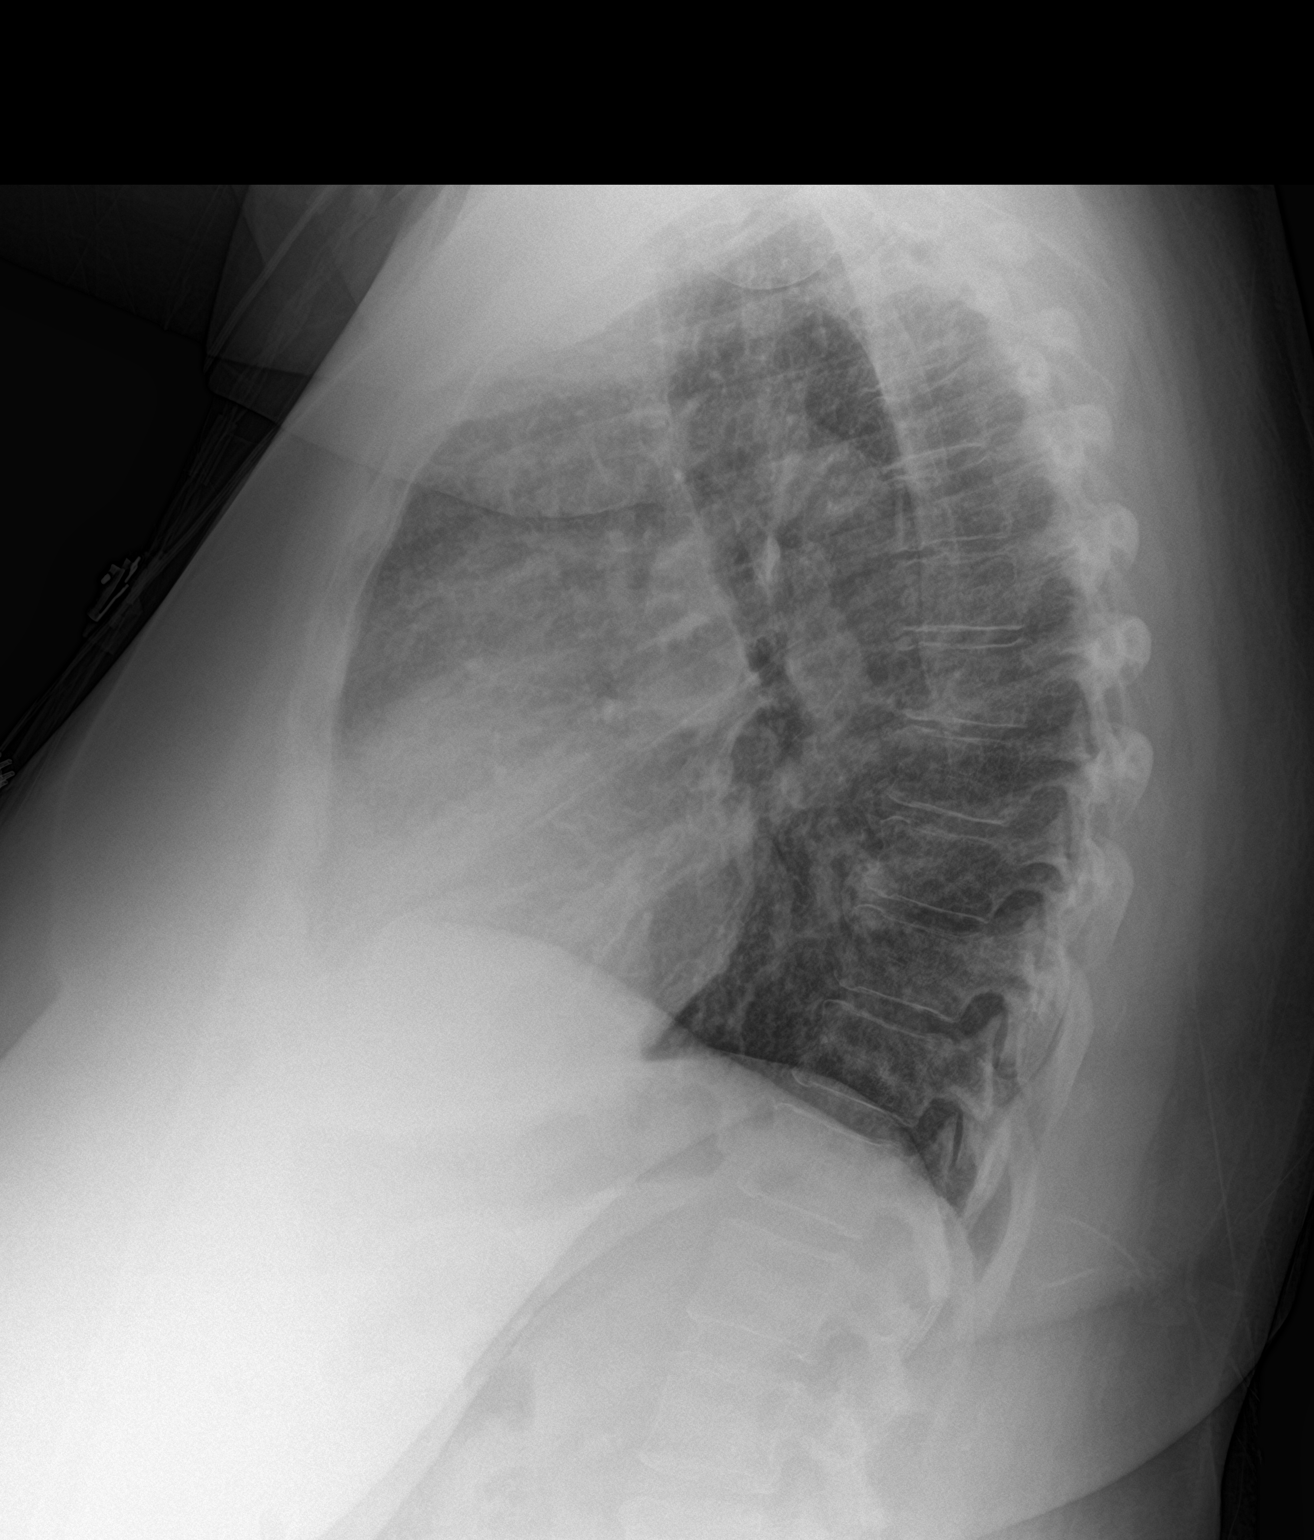

[2 of 2 positions shown; findings below may reference images not displayed]

FINDINGS: Cardiomegaly and interstitial prominence again noted.

There is no evidence of focal airspace disease, pulmonary edema,
suspicious pulmonary nodule/mass, pleural effusion, or pneumothorax.
No acute bony abnormalities are identified.
IMPRESSION: Cardiomegaly without evidence of acute abnormality.

Unchanged interstitial opacities.

## 2019-01-02 DIAGNOSIS — J449 Chronic obstructive pulmonary disease, unspecified: Secondary | ICD-10-CM | POA: Diagnosis not present

## 2019-01-02 DIAGNOSIS — J9621 Acute and chronic respiratory failure with hypoxia: Secondary | ICD-10-CM | POA: Diagnosis not present

## 2019-01-03 ENCOUNTER — Encounter (HOSPITAL_COMMUNITY): Payer: Self-pay | Admitting: Emergency Medicine

## 2019-01-03 ENCOUNTER — Inpatient Hospital Stay (HOSPITAL_COMMUNITY)
Admission: EM | Admit: 2019-01-03 | Discharge: 2019-01-11 | DRG: 190 | Disposition: A | Discharge status: Home Health | Payer: Medicare Other | Attending: Internal Medicine | Admitting: Internal Medicine

## 2019-01-03 ENCOUNTER — Emergency Department (HOSPITAL_COMMUNITY): Payer: Medicare Other

## 2019-01-03 ENCOUNTER — Other Ambulatory Visit: Payer: Self-pay

## 2019-01-03 DIAGNOSIS — Z9089 Acquired absence of other organs: Secondary | ICD-10-CM | POA: Diagnosis not present

## 2019-01-03 DIAGNOSIS — I5033 Acute on chronic diastolic (congestive) heart failure: Secondary | ICD-10-CM | POA: Diagnosis present

## 2019-01-03 DIAGNOSIS — I2781 Cor pulmonale (chronic): Secondary | ICD-10-CM | POA: Diagnosis not present

## 2019-01-03 DIAGNOSIS — J9621 Acute and chronic respiratory failure with hypoxia: Secondary | ICD-10-CM

## 2019-01-03 DIAGNOSIS — J432 Centrilobular emphysema: Principal | ICD-10-CM | POA: Diagnosis present

## 2019-01-03 DIAGNOSIS — E1122 Type 2 diabetes mellitus with diabetic chronic kidney disease: Secondary | ICD-10-CM | POA: Diagnosis present

## 2019-01-03 DIAGNOSIS — I2729 Other secondary pulmonary hypertension: Secondary | ICD-10-CM | POA: Diagnosis not present

## 2019-01-03 DIAGNOSIS — I130 Hypertensive heart and chronic kidney disease with heart failure and stage 1 through stage 4 chronic kidney disease, or unspecified chronic kidney disease: Secondary | ICD-10-CM | POA: Diagnosis present

## 2019-01-03 DIAGNOSIS — Z23 Encounter for immunization: Secondary | ICD-10-CM | POA: Diagnosis not present

## 2019-01-03 DIAGNOSIS — J9620 Acute and chronic respiratory failure, unspecified whether with hypoxia or hypercapnia: Secondary | ICD-10-CM | POA: Diagnosis present

## 2019-01-03 DIAGNOSIS — R0602 Shortness of breath: Secondary | ICD-10-CM | POA: Diagnosis not present

## 2019-01-03 DIAGNOSIS — J810 Acute pulmonary edema: Secondary | ICD-10-CM | POA: Diagnosis not present

## 2019-01-03 DIAGNOSIS — Z90710 Acquired absence of both cervix and uterus: Secondary | ICD-10-CM

## 2019-01-03 DIAGNOSIS — Z833 Family history of diabetes mellitus: Secondary | ICD-10-CM

## 2019-01-03 DIAGNOSIS — G4733 Obstructive sleep apnea (adult) (pediatric): Secondary | ICD-10-CM

## 2019-01-03 DIAGNOSIS — Z8249 Family history of ischemic heart disease and other diseases of the circulatory system: Secondary | ICD-10-CM

## 2019-01-03 DIAGNOSIS — M545 Low back pain: Secondary | ICD-10-CM | POA: Diagnosis not present

## 2019-01-03 DIAGNOSIS — K219 Gastro-esophageal reflux disease without esophagitis: Secondary | ICD-10-CM | POA: Diagnosis not present

## 2019-01-03 DIAGNOSIS — Z7982 Long term (current) use of aspirin: Secondary | ICD-10-CM

## 2019-01-03 DIAGNOSIS — Z87891 Personal history of nicotine dependence: Secondary | ICD-10-CM

## 2019-01-03 DIAGNOSIS — J9601 Acute respiratory failure with hypoxia: Secondary | ICD-10-CM

## 2019-01-03 DIAGNOSIS — N184 Chronic kidney disease, stage 4 (severe): Secondary | ICD-10-CM | POA: Diagnosis present

## 2019-01-03 DIAGNOSIS — I2699 Other pulmonary embolism without acute cor pulmonale: Secondary | ICD-10-CM | POA: Diagnosis not present

## 2019-01-03 DIAGNOSIS — H409 Unspecified glaucoma: Secondary | ICD-10-CM | POA: Diagnosis present

## 2019-01-03 DIAGNOSIS — Z7189 Other specified counseling: Secondary | ICD-10-CM | POA: Diagnosis not present

## 2019-01-03 DIAGNOSIS — J9600 Acute respiratory failure, unspecified whether with hypoxia or hypercapnia: Secondary | ICD-10-CM | POA: Diagnosis present

## 2019-01-03 DIAGNOSIS — N183 Chronic kidney disease, stage 3 unspecified: Secondary | ICD-10-CM | POA: Diagnosis present

## 2019-01-03 DIAGNOSIS — N179 Acute kidney failure, unspecified: Secondary | ICD-10-CM | POA: Diagnosis present

## 2019-01-03 DIAGNOSIS — Z9981 Dependence on supplemental oxygen: Secondary | ICD-10-CM | POA: Diagnosis not present

## 2019-01-03 DIAGNOSIS — E785 Hyperlipidemia, unspecified: Secondary | ICD-10-CM | POA: Diagnosis not present

## 2019-01-03 DIAGNOSIS — Z885 Allergy status to narcotic agent status: Secondary | ICD-10-CM

## 2019-01-03 DIAGNOSIS — E669 Obesity, unspecified: Secondary | ICD-10-CM | POA: Diagnosis present

## 2019-01-03 DIAGNOSIS — J9622 Acute and chronic respiratory failure with hypercapnia: Secondary | ICD-10-CM | POA: Diagnosis not present

## 2019-01-03 DIAGNOSIS — R0600 Dyspnea, unspecified: Secondary | ICD-10-CM

## 2019-01-03 DIAGNOSIS — Z888 Allergy status to other drugs, medicaments and biological substances status: Secondary | ICD-10-CM

## 2019-01-03 DIAGNOSIS — D649 Anemia, unspecified: Secondary | ICD-10-CM | POA: Diagnosis not present

## 2019-01-03 DIAGNOSIS — J449 Chronic obstructive pulmonary disease, unspecified: Secondary | ICD-10-CM | POA: Diagnosis present

## 2019-01-03 DIAGNOSIS — I10 Essential (primary) hypertension: Secondary | ICD-10-CM | POA: Diagnosis present

## 2019-01-03 DIAGNOSIS — I272 Pulmonary hypertension, unspecified: Secondary | ICD-10-CM | POA: Diagnosis present

## 2019-01-03 DIAGNOSIS — Z515 Encounter for palliative care: Secondary | ICD-10-CM

## 2019-01-03 DIAGNOSIS — G8929 Other chronic pain: Secondary | ICD-10-CM | POA: Diagnosis not present

## 2019-01-03 DIAGNOSIS — Z7951 Long term (current) use of inhaled steroids: Secondary | ICD-10-CM

## 2019-01-03 DIAGNOSIS — Z7984 Long term (current) use of oral hypoglycemic drugs: Secondary | ICD-10-CM

## 2019-01-03 DIAGNOSIS — Z6833 Body mass index (BMI) 33.0-33.9, adult: Secondary | ICD-10-CM | POA: Diagnosis not present

## 2019-01-03 DIAGNOSIS — E119 Type 2 diabetes mellitus without complications: Secondary | ICD-10-CM

## 2019-01-03 DIAGNOSIS — R9431 Abnormal electrocardiogram [ECG] [EKG]: Secondary | ICD-10-CM | POA: Diagnosis not present

## 2019-01-03 DIAGNOSIS — J9690 Respiratory failure, unspecified, unspecified whether with hypoxia or hypercapnia: Secondary | ICD-10-CM | POA: Diagnosis not present

## 2019-01-03 DIAGNOSIS — Z791 Long term (current) use of non-steroidal anti-inflammatories (NSAID): Secondary | ICD-10-CM

## 2019-01-03 DIAGNOSIS — Z86010 Personal history of colonic polyps: Secondary | ICD-10-CM

## 2019-01-03 DIAGNOSIS — F259 Schizoaffective disorder, unspecified: Secondary | ICD-10-CM | POA: Diagnosis present

## 2019-01-03 DIAGNOSIS — Z820 Family history of epilepsy and other diseases of the nervous system: Secondary | ICD-10-CM

## 2019-01-03 DIAGNOSIS — R0902 Hypoxemia: Secondary | ICD-10-CM | POA: Diagnosis not present

## 2019-01-03 DIAGNOSIS — Z818 Family history of other mental and behavioral disorders: Secondary | ICD-10-CM

## 2019-01-03 DIAGNOSIS — Z79899 Other long term (current) drug therapy: Secondary | ICD-10-CM

## 2019-01-03 LAB — PROCALCITONIN: Procalcitonin: <0.1 ng/mL

## 2019-01-03 LAB — POCT I-STAT EG7
ACID-BASE DEFICIT: 1 mmol/L (ref 0.0–2.0)
BICARBONATE: 26.5 mmol/L (ref 20.0–28.0)
Calcium, Ion: 1.24 mmol/L (ref 1.15–1.40)
HCT: 62 % — ABNORMAL HIGH (ref 36.0–46.0)
HEMOGLOBIN: 21.1 g/dL — AB (ref 12.0–15.0)
O2 SAT: 34 %
Potassium: 4.3 mmol/L (ref 3.5–5.1)
SODIUM: 132 mmol/L — AB (ref 135–145)
TCO2: 28 mmol/L (ref 22–32)
pCO2, Ven: 49.4 mmHg (ref 44.0–60.0)
pH, Ven: 7.337 (ref 7.250–7.430)
pO2, Ven: 22 mmHg — CL (ref 32.0–45.0)

## 2019-01-03 LAB — D-DIMER, QUANTITATIVE: D-Dimer, Quant: 1.44 ug/mL-FEU — ABNORMAL HIGH (ref 0.00–0.50)

## 2019-01-03 LAB — CBG MONITORING, ED
Glucose-Capillary: 83 mg/dL (ref 70–99)
Glucose-Capillary: 273 mg/dL — ABNORMAL HIGH (ref 70–99)

## 2019-01-03 LAB — BASIC METABOLIC PANEL
Anion gap: 12 (ref 5–15)
BUN: 26 mg/dL — ABNORMAL HIGH (ref 8–23)
CO2: 23 mmol/L (ref 22–32)
Calcium: 10 mg/dL (ref 8.9–10.3)
Chloride: 96 mmol/L — ABNORMAL LOW (ref 98–111)
Creatinine, Ser: 2.28 mg/dL — ABNORMAL HIGH (ref 0.44–1.00)
GFR calc Af Amer: 25 mL/min — ABNORMAL LOW (ref >60)
GFR calc non Af Amer: 21 mL/min — ABNORMAL LOW (ref >60)
Glucose, Bld: 87 mg/dL (ref 70–99)
Potassium: 4.4 mmol/L (ref 3.5–5.1)
Sodium: 131 mmol/L — ABNORMAL LOW (ref 135–145)

## 2019-01-03 LAB — CBC WITH DIFFERENTIAL/PLATELET
Abs Immature Granulocytes: 0.02 10*3/uL (ref 0.00–0.07)
Basophils Absolute: 0 10*3/uL (ref 0.0–0.1)
Basophils Relative: 1 %
Eosinophils Absolute: 0.4 10*3/uL (ref 0.0–0.5)
Eosinophils Relative: 6 %
HCT: 35.5 % — ABNORMAL LOW (ref 36.0–46.0)
Hemoglobin: 10.4 g/dL — ABNORMAL LOW (ref 12.0–15.0)
Immature Granulocytes: 0 %
Lymphocytes Relative: 26 %
Lymphs Abs: 1.4 10*3/uL (ref 0.7–4.0)
MCH: 24.1 pg — ABNORMAL LOW (ref 26.0–34.0)
MCHC: 29.3 g/dL — ABNORMAL LOW (ref 30.0–36.0)
MCV: 82.4 fL (ref 80.0–100.0)
Monocytes Absolute: 0.4 10*3/uL (ref 0.1–1.0)
Monocytes Relative: 8 %
Neutro Abs: 3.2 10*3/uL (ref 1.7–7.7)
Neutrophils Relative %: 59 %
Platelets: 449 10*3/uL — ABNORMAL HIGH (ref 150–400)
RBC: 4.31 MIL/uL (ref 3.87–5.11)
RDW: 17.3 % — ABNORMAL HIGH (ref 11.5–15.5)
WBC: 5.5 10*3/uL (ref 4.0–10.5)
nRBC: 0 % (ref 0.0–0.2)

## 2019-01-03 LAB — TROPONIN I: Troponin I: <0.03 ng/mL (ref <0.03)

## 2019-01-03 LAB — HEPARIN LEVEL (UNFRACTIONATED): Heparin Unfractionated: 0.24 IU/mL — ABNORMAL LOW (ref 0.30–0.70)

## 2019-01-03 LAB — BRAIN NATRIURETIC PEPTIDE: B Natriuretic Peptide: 60.2 pg/mL (ref 0.0–100.0)

## 2019-01-03 LAB — GLUCOSE, CAPILLARY: Glucose-Capillary: 172 mg/dL — ABNORMAL HIGH (ref 70–99)

## 2019-01-03 MED ORDER — AMLODIPINE BESYLATE 10 MG PO TABS
10.0000 mg | ORAL_TABLET | Freq: Every day | ORAL | Status: DC
  Administered 2019-01-04 – 2019-01-11 (×8): 10 mg via ORAL
  Filled 2019-01-03 (×8): qty 1

## 2019-01-03 MED ORDER — SODIUM CHLORIDE 0.9 % IV SOLN
500.0000 mg | Freq: Once | INTRAVENOUS | Status: AC
  Administered 2019-01-03: 500 mg via INTRAVENOUS
  Filled 2019-01-03: qty 500

## 2019-01-03 MED ORDER — ALBUTEROL SULFATE HFA 108 (90 BASE) MCG/ACT IN AERS: 2.0000 | INHALATION_SPRAY | Freq: Four times a day (QID) | RESPIRATORY_TRACT | Status: DC | PRN

## 2019-01-03 MED ORDER — ASPIRIN EC 81 MG PO TBEC
81.0000 mg | DELAYED_RELEASE_TABLET | Freq: Every day | ORAL | Status: DC
  Administered 2019-01-04 – 2019-01-11 (×8): 81 mg via ORAL
  Filled 2019-01-03 (×8): qty 1

## 2019-01-03 MED ORDER — ARIPIPRAZOLE 10 MG PO TABS
20.0000 mg | ORAL_TABLET | Freq: Every day | ORAL | Status: DC
  Administered 2019-01-03 – 2019-01-10 (×8): 20 mg via ORAL
  Filled 2019-01-03 (×8): qty 2

## 2019-01-03 MED ORDER — MIRTAZAPINE 7.5 MG PO TABS
7.5000 mg | ORAL_TABLET | Freq: Every day | ORAL | Status: DC
  Administered 2019-01-03 – 2019-01-10 (×8): 7.5 mg via ORAL
  Filled 2019-01-03 (×8): qty 1

## 2019-01-03 MED ORDER — COQ10 100 MG PO CAPS: 100.0000 mg | ORAL_CAPSULE | ORAL | Status: DC

## 2019-01-03 MED ORDER — METHYLPREDNISOLONE SODIUM SUCC 125 MG IJ SOLR
125.0000 mg | Freq: Once | INTRAMUSCULAR | Status: AC
  Administered 2019-01-03: 125 mg via INTRAVENOUS
  Filled 2019-01-03: qty 2

## 2019-01-03 MED ORDER — SILDENAFIL CITRATE 20 MG PO TABS: 20.0000 mg | ORAL_TABLET | Freq: Three times a day (TID) | ORAL | Status: DC

## 2019-01-03 MED ORDER — SIMVASTATIN 20 MG PO TABS
40.0000 mg | ORAL_TABLET | Freq: Every day | ORAL | Status: DC
  Administered 2019-01-04: 40 mg via ORAL
  Filled 2019-01-03 (×2): qty 2

## 2019-01-03 MED ORDER — FUROSEMIDE 20 MG PO TABS
40.0000 mg | ORAL_TABLET | Freq: Every day | ORAL | Status: DC
  Administered 2019-01-04: 40 mg via ORAL
  Filled 2019-01-03: qty 2

## 2019-01-03 MED ORDER — HEPARIN (PORCINE) 25000 UT/250ML-% IV SOLN
1400.0000 [IU]/h | INTRAVENOUS | Status: DC
  Administered 2019-01-03: 1200 [IU]/h via INTRAVENOUS
  Administered 2019-01-04: 1400 [IU]/h via INTRAVENOUS
  Filled 2019-01-03 (×2): qty 250

## 2019-01-03 MED ORDER — HEPARIN BOLUS VIA INFUSION
4500.0000 [IU] | Freq: Once | INTRAVENOUS | Status: AC
  Administered 2019-01-03: 4500 [IU] via INTRAVENOUS
  Filled 2019-01-03: qty 4500

## 2019-01-03 MED ORDER — LATANOPROST 0.005 % OP SOLN
1.0000 [drp] | Freq: Every day | OPHTHALMIC | Status: DC
  Administered 2019-01-03 – 2019-01-10 (×8): 1 [drp] via OPHTHALMIC
  Filled 2019-01-03: qty 2.5

## 2019-01-03 MED ORDER — BISACODYL 10 MG RE SUPP: 10.0000 mg | Freq: Every day | RECTAL | Status: DC | PRN

## 2019-01-03 MED ORDER — ACETAMINOPHEN 500 MG PO TABS: 1000.0000 mg | ORAL_TABLET | Freq: Two times a day (BID) | ORAL | Status: DC | PRN

## 2019-01-03 MED ORDER — DONEPEZIL HCL 5 MG PO TABS
5.0000 mg | ORAL_TABLET | Freq: Every day | ORAL | Status: DC
  Administered 2019-01-03 – 2019-01-10 (×8): 5 mg via ORAL
  Filled 2019-01-03 (×8): qty 1

## 2019-01-03 MED ORDER — SODIUM CHLORIDE 0.9 % IV SOLN
1.0000 g | Freq: Once | INTRAVENOUS | Status: AC
  Administered 2019-01-03: 1 g via INTRAVENOUS
  Filled 2019-01-03: qty 10

## 2019-01-03 MED ORDER — FERROUS SULFATE 325 (65 FE) MG PO TABS
325.0000 mg | ORAL_TABLET | Freq: Every day | ORAL | Status: DC
  Administered 2019-01-04 – 2019-01-10 (×7): 325 mg via ORAL
  Filled 2019-01-03 (×7): qty 1

## 2019-01-03 MED ORDER — PREDNISONE 20 MG PO TABS
40.0000 mg | ORAL_TABLET | Freq: Every day | ORAL | Status: DC
  Administered 2019-01-04 – 2019-01-10 (×7): 40 mg via ORAL
  Filled 2019-01-03 (×7): qty 2

## 2019-01-03 MED ORDER — ALBUTEROL SULFATE (2.5 MG/3ML) 0.083% IN NEBU: 2.5000 mg | INHALATION_SOLUTION | RESPIRATORY_TRACT | Status: DC | PRN

## 2019-01-03 MED ORDER — SODIUM CHLORIDE 0.9 % IV SOLN: INTRAVENOUS | Status: DC

## 2019-01-03 MED ORDER — IPRATROPIUM-ALBUTEROL 20-100 MCG/ACT IN AERS: 1.0000 | INHALATION_SPRAY | RESPIRATORY_TRACT | Status: DC | PRN

## 2019-01-03 MED ORDER — MUSCLE RUB 10-15 % EX CREA: TOPICAL_CREAM | Freq: Two times a day (BID) | CUTANEOUS | Status: DC | PRN

## 2019-01-03 MED ORDER — SENNA 8.6 MG PO TABS: 1.0000 | ORAL_TABLET | Freq: Every day | ORAL | Status: DC | PRN

## 2019-01-03 MED ORDER — DORZOLAMIDE HCL-TIMOLOL MAL 2-0.5 % OP SOLN
1.0000 [drp] | Freq: Two times a day (BID) | OPHTHALMIC | Status: DC
  Administered 2019-01-03 – 2019-01-11 (×16): 1 [drp] via OPHTHALMIC
  Filled 2019-01-03: qty 10

## 2019-01-03 MED ORDER — SODIUM CHLORIDE 0.9 % IV SOLN
1.0000 g | INTRAVENOUS | Status: DC
  Administered 2019-01-04: 1 g via INTRAVENOUS
  Filled 2019-01-03 (×2): qty 10

## 2019-01-03 MED ORDER — SILDENAFIL CITRATE 20 MG PO TABS
20.0000 mg | ORAL_TABLET | Freq: Three times a day (TID) | ORAL | Status: DC
  Administered 2019-01-03 – 2019-01-11 (×23): 20 mg via ORAL
  Filled 2019-01-03 (×24): qty 1

## 2019-01-03 MED ORDER — ALBUTEROL SULFATE (2.5 MG/3ML) 0.083% IN NEBU
2.5000 mg | INHALATION_SOLUTION | Freq: Once | RESPIRATORY_TRACT | Status: AC
  Administered 2019-01-03: 2.5 mg via RESPIRATORY_TRACT
  Filled 2019-01-03: qty 3

## 2019-01-03 MED ORDER — FENOFIBRATE 160 MG PO TABS
160.0000 mg | ORAL_TABLET | Freq: Every day | ORAL | Status: DC
  Administered 2019-01-04 – 2019-01-11 (×8): 160 mg via ORAL
  Filled 2019-01-03 (×8): qty 1

## 2019-01-03 MED ORDER — LACTULOSE 10 GM/15ML PO SOLN
20.0000 g | Freq: Two times a day (BID) | ORAL | Status: DC | PRN
  Filled 2019-01-03: qty 30

## 2019-01-03 MED ORDER — FUROSEMIDE 10 MG/ML IJ SOLN
40.0000 mg | Freq: Four times a day (QID) | INTRAMUSCULAR | Status: AC
  Administered 2019-01-03 – 2019-01-04 (×3): 40 mg via INTRAVENOUS
  Filled 2019-01-03 (×3): qty 4

## 2019-01-03 MED ORDER — GUAIFENESIN ER 600 MG PO TB12
600.0000 mg | ORAL_TABLET | Freq: Two times a day (BID) | ORAL | Status: DC
  Administered 2019-01-04 – 2019-01-11 (×15): 600 mg via ORAL
  Filled 2019-01-03 (×15): qty 1

## 2019-01-03 MED ORDER — LAMOTRIGINE 100 MG PO TABS
100.0000 mg | ORAL_TABLET | Freq: Every day | ORAL | Status: DC
  Administered 2019-01-03 – 2019-01-10 (×8): 100 mg via ORAL
  Filled 2019-01-03 (×2): qty 4
  Filled 2019-01-03: qty 1
  Filled 2019-01-03 (×2): qty 4
  Filled 2019-01-03: qty 1
  Filled 2019-01-03: qty 4
  Filled 2019-01-03 (×3): qty 1
  Filled 2019-01-03: qty 4
  Filled 2019-01-03 (×2): qty 1
  Filled 2019-01-03: qty 4
  Filled 2019-01-03 (×2): qty 1
  Filled 2019-01-03: qty 4

## 2019-01-03 MED ORDER — MACITENTAN 10 MG PO TABS
10.0000 mg | ORAL_TABLET | Freq: Every day | ORAL | Status: DC
  Administered 2019-01-04 – 2019-01-11 (×8): 10 mg via ORAL
  Filled 2019-01-03 (×8): qty 1

## 2019-01-03 MED ORDER — ENOXAPARIN SODIUM 30 MG/0.3ML ~~LOC~~ SOLN: 30.0000 mg | SUBCUTANEOUS | Status: DC

## 2019-01-03 MED ORDER — INSULIN ASPART 100 UNIT/ML ~~LOC~~ SOLN
0.0000 [IU] | Freq: Every day | SUBCUTANEOUS | Status: DC
  Administered 2019-01-05: 2 [IU] via SUBCUTANEOUS
  Administered 2019-01-06: 3 [IU] via SUBCUTANEOUS
  Administered 2019-01-08 – 2019-01-09 (×2): 2 [IU] via SUBCUTANEOUS
  Administered 2019-01-10: 3 [IU] via SUBCUTANEOUS

## 2019-01-03 MED ORDER — ATORVASTATIN CALCIUM 10 MG PO TABS
20.0000 mg | ORAL_TABLET | Freq: Every day | ORAL | Status: DC
  Administered 2019-01-03 – 2019-01-10 (×8): 20 mg via ORAL
  Filled 2019-01-03 (×8): qty 2

## 2019-01-03 MED ORDER — MENTHOL (TOPICAL ANALGESIC) 10 % EX LIQD: Freq: Two times a day (BID) | CUTANEOUS | Status: DC | PRN

## 2019-01-03 MED ORDER — MAGNESIUM OXIDE 400 (241.3 MG) MG PO TABS
400.0000 mg | ORAL_TABLET | ORAL | Status: DC
  Administered 2019-01-04 – 2019-01-10 (×4): 400 mg via ORAL
  Filled 2019-01-03 (×8): qty 1

## 2019-01-03 MED ORDER — SODIUM CHLORIDE 0.9 % IV SOLN
500.0000 mg | INTRAVENOUS | Status: DC
  Administered 2019-01-04: 500 mg via INTRAVENOUS
  Filled 2019-01-03 (×2): qty 500

## 2019-01-03 MED ORDER — INSULIN ASPART 100 UNIT/ML ~~LOC~~ SOLN
0.0000 [IU] | Freq: Three times a day (TID) | SUBCUTANEOUS | Status: DC
  Administered 2019-01-03: 5 [IU] via SUBCUTANEOUS
  Administered 2019-01-04 – 2019-01-06 (×4): 1 [IU] via SUBCUTANEOUS
  Administered 2019-01-06 – 2019-01-07 (×2): 2 [IU] via SUBCUTANEOUS
  Administered 2019-01-08: 5 [IU] via SUBCUTANEOUS
  Administered 2019-01-09: 2 [IU] via SUBCUTANEOUS
  Administered 2019-01-09: 3 [IU] via SUBCUTANEOUS
  Administered 2019-01-10: 2 [IU] via SUBCUTANEOUS
  Administered 2019-01-10: 1 [IU] via SUBCUTANEOUS
  Administered 2019-01-10: 3 [IU] via SUBCUTANEOUS
  Administered 2019-01-11: 1 [IU] via SUBCUTANEOUS

## 2019-01-03 MED ORDER — IPRATROPIUM-ALBUTEROL 0.5-2.5 (3) MG/3ML IN SOLN
3.0000 mL | RESPIRATORY_TRACT | Status: DC | PRN
  Administered 2019-01-05 – 2019-01-06 (×3): 3 mL via RESPIRATORY_TRACT
  Filled 2019-01-03 (×3): qty 3

## 2019-01-03 MED ORDER — MOMETASONE FURO-FORMOTEROL FUM 200-5 MCG/ACT IN AERO
2.0000 | INHALATION_SPRAY | Freq: Two times a day (BID) | RESPIRATORY_TRACT | Status: DC
  Administered 2019-01-04 – 2019-01-11 (×15): 2 via RESPIRATORY_TRACT
  Filled 2019-01-03: qty 8.8

## 2019-01-03 MED ORDER — ASPIRIN 81 MG PO TBEC: 81.0000 mg | DELAYED_RELEASE_TABLET | Freq: Every day | ORAL | Status: DC

## 2019-01-03 MED ORDER — POLYETHYLENE GLYCOL 3350 17 G PO PACK: 17.0000 g | PACK | Freq: Every day | ORAL | Status: DC | PRN

## 2019-01-03 NOTE — Progress Notes (Signed)
ANTICOAGULATION CONSULT NOTE - Initial Consult  Pharmacy Consult for heparin Indication: r/o PE  Allergies  Allergen Reactions  . Codeine Nausea And Vomiting  . Nitrostat [Nitroglycerin] Other (See Comments)    Pt is on revatio  . Prednisone Other (See Comments)    Has to monitor because she is a diabetic     Patient Measurements: Height: 5' 3.5" (161.3 cm) Weight: 193 lb (87.5 kg) IBW/kg (Calculated) : 53.55 Heparin Dosing Weight: 73.1kg  Vital Signs: BP: 134/87 (01/21 1330) Pulse Rate: 81 (01/21 1330)  Labs: Recent Labs    01/03/19 1300 01/03/19 1343  HGB 10.4* 21.1*  HCT 35.5* 62.0*  PLT 449*  --   CREATININE 2.28*  --   TROPONINI <0.03  --     Estimated Creatinine Clearance: 25.1 mL/min (A) (by C-G formula based on SCr of 2.28 mg/dL (H)).   Medical History: Past Medical History:  Diagnosis Date  . Acute respiratory failure ([place]) 06/09/2017  . Arthritis   . Back pain   . Bell's palsy   . Bipolar disorder ([place])   . Bronchitis   . Chronic low back pain 05/09/2015  . COPD (chronic obstructive pulmonary disease) ([place])    hyoxia during sleep- using O2 as needed  . Cough with expectoration 05/21/16   recent diagnosis of bronchitis  . Cyst of right kidney   . Diabetes mellitus without complication ([place])    type 2  . Frequency of urination   . GERD (gastroesophageal reflux disease)   . Glaucoma   . History of blood transfusion   . History of colon polyps   . History of hiatal hernia   . History of tobacco use   . Hypercalcemia   . Hyperlipidemia   . Hypertension   . Memory disorder 09/05/2014  . On home oxygen therapy    3L/M [place] at night   . Schizo-affective psychosis ([place])   . Shortness of breath dyspnea    exertion, or with out oxygen  . Uterine fibroid     Medications:  Infusions:  . azithromycin (ZITHROMAX) 500 MG IVPB (Vial-Mate [employer])    . cefTRIAXone (ROCEPHIN)  IV 1 g (01/03/19 1418)  . heparin      Assessment: 14 yof presented to  the ED with SOB. To start IV heparin for possible PE. Hgb and platelets are elevated. She is not on antiocoagulation PTA.   Goal of Therapy:  Heparin level 0.3-0.7 units/ml Monitor platelets by anticoagulation protocol: Yes   Plan:  Heparin bolus 4500 units IV x 1 Heparin gtt 1200 units/hr Check an 8 hr heparin level Daily heparin level and CBC F/u VQ scan  Abbigal Radich, Rande Lawman 01/03/2019,2:19 PM

## 2019-01-03 NOTE — ED Notes (Signed)
Pt placed on nonrebreather when sats stayed in 70's on 5 L [place]

## 2019-01-03 NOTE — Progress Notes (Signed)
PHARMACIST - PHYSICIAN ORDER COMMUNICATION  CONCERNING: P&T Medication Policy on Herbal Medications  DESCRIPTION:  This patient's order for:  Co Q 10  has been noted.  This product(s) is classified as an "herbal" or natural product. Due to a lack of definitive safety studies or FDA approval, nonstandard manufacturing practices, plus the potential risk of unknown drug-drug interactions while on inpatient medications, the Pharmacy and Therapeutics Committee does not permit the use of "herbal" or natural products of this type within [facility].   ACTION TAKEN: The pharmacy department is unable to verify this order at this time and your patient has been informed of this safety policy. Please reevaluate patient's clinical condition at discharge and address if the herbal or natural product(s) should be resumed at that time.

## 2019-01-03 NOTE — ED Notes (Signed)
Attempted report x 1.

## 2019-01-03 NOTE — ED Provider Notes (Addendum)
[facility] DEPART[facility] Provider Note   CSN: [id number] Arrival date & time: 01/03/19  1245     History   Chief Complaint Chief Complaint  Patient presents with  . Shortness of Breath    HPI Elizabeth Thompson is a 68 y.o. female.  HPI   69 year old female with acute on chronic dyspnea.  Past history of severe pulmonary hypertension, grade 1 diastolic dysfunction, chronic kidney disease, COPD chronically on 4 to 5 L at baseline, diabetes and hypertension.  Increasingly short of breath over the past several days.  Oxygen saturations in the 70s on 5 L at home.  Consistent with values she got here in the emergency room.  She is requiring 12 L of oxygen per minute to keep her oxygen saturations in the 90s.  She denies any acute pain.  Occasional cough.  No unusual swelling.  No fevers.  Reports compliance with her medications.  Past Medical History:  Diagnosis Date  . Acute respiratory failure ([place]) 06/09/2017  . Arthritis   . Back pain   . Bell's palsy   . Bipolar disorder ([place])   . Bronchitis   . Chronic low back pain 05/09/2015  . COPD (chronic obstructive pulmonary disease) ([place])    hyoxia during sleep- using O2 as needed  . Cough with expectoration 05/21/16   recent diagnosis of bronchitis  . Cyst of right kidney   . Diabetes mellitus without complication ([place])    type 2  . Frequency of urination   . GERD (gastroesophageal reflux disease)   . Glaucoma   . History of blood transfusion   . History of colon polyps   . History of hiatal hernia   . History of tobacco use   . Hypercalcemia   . Hyperlipidemia   . Hypertension   . Memory disorder 09/05/2014  . On home oxygen therapy    3L/M [place] at night   . Schizo-affective psychosis ([place])   . Shortness of breath dyspnea    exertion, or with out oxygen  . Uterine fibroid     Patient Active Problem List   Diagnosis Date Noted  . Hypertension 07/02/2017  . Chronic respiratory failure with  hypoxia, on home O2 therapy ([place]) 07/02/2017  . COPD (chronic obstructive pulmonary disease) ([place]) 07/02/2017  . Diabetes mellitus without complication ([place]) 41/28/7867  . Pulmonary hypertension ([place]) 07/02/2017  . Bipolar disorder ([place]) 07/02/2017  . Hyperlipidemia 07/02/2017  . Acute hyponatremia 07/02/2017  . Acute metabolic encephalopathy 67/20/9470  . CKD (chronic kidney disease) stage 3, GFR 30-59 ml/min ([facility]) 07/02/2017  . Elevated troponin   . Cor pulmonale, acute ([place])   . Acute on chronic respiratory failure ([place]) 06/09/2017  . Chronic renal disease, stage III ([place]) 05/05/2017  . Chronic respiratory failure with hypoxia ([place]) 05/05/2017  . Renal cell carcinoma, left ([place]) 02/05/2017  . Dyspnea on exertion 12/28/2016  . Depression 09/04/2016  . Renal malignant tumor ([place]) 06/26/2016  . GERD (gastroesophageal reflux disease) 03/22/2016  . Insomnia 03/22/2016  . COPD GOLD 0  12/06/2015  . Essential hypertension 12/06/2015  . COPD exacerbation ([place]) 11/20/2015  . Multiple lung nodules 11/20/2015  . Morbid obesity due to excess calories ([place]) 11/20/2015  . Hypersomnia with sleep apnea 11/20/2015  . Diabetes mellitus type 2, controlled, without complications ([place]) 96/28/3662  . HLD (hyperlipidemia) 11/06/2015  . Chronic low back pain 05/09/2015  . Gait disorder 05/09/2015  . Memory disorder 09/05/2014  . Schizoaffective disorder ([place]) 08/18/2014  Past Surgical History:  Procedure Laterality Date  . ABDOMINAL HYSTERECTOMY    . APPENDECTOMY    . COLONOSCOPY W/ POLYPECTOMY    . IR GENERIC HISTORICAL  04/29/2016   IR RADIOLOGIST EVAL & MGMT 04/29/2016 Corrie Mckusick, DO [facility] INTER[facility] RAD  . IR GENERIC HISTORICAL  10/01/2016   IR RADIOLOGIST EVAL & MGMT 10/01/2016 [facility] INTERV RAD  . IR GENERIC HISTORICAL  11/11/2016   IR RADIOLOGIST EVAL & MGMT 11/11/2016 Corrie Mckusick, DO [facility] INTER[facility] RAD  . IR GENERIC HISTORICAL  07/16/2016   IR RADIOLOGIST EVAL & MGMT 07/16/2016 Corrie Mckusick, DO [facility] INTERV RAD  . IR RADIOLOGIST EVAL & MGMT  03/10/2017  . IR RADIOLOGIST EVAL & MGMT  05/13/2017  . IR RADIOLOGIST EVAL & MGMT  04/27/2018  . LUMBAR LAMINECTOMY/DECOMPRESSION MICRODISCECTOMY N/A 08/14/2015   Procedure: LUMBAR DECOMPRESSION MICRODISCECTOMY L3-S1;  Surgeon: Melina Schools, MD;  Location: [facility];  Service: Orthopedics;  Laterality: N/A;  . RADIOFREQUENCY ABLATION Left 02/05/2017   Procedure: LEFT RENAL CRYOABLATION;  Surgeon: Corrie Mckusick, DO;  Location: [facility];  Service: Anesthesiology;  Laterality: Left;  . RIGHT HEART CATH N/A 07/20/2017   Procedure: Right Heart Cath;  Surgeon: Nigel Mormon, MD;  Location: [facility];  Service: Cardiovascular;  Laterality: N/A;  . RIGHT/LEFT HEART CATH AND CORONARY ANGIOGRAPHY N/A 06/11/2017   Procedure: Right/Left Heart Cath and Coronary Angiography;  Surgeon: Dixie Dials, MD;  Location: [facility];  Service: Cardiovascular;  Laterality: N/A;  . TONSILLECTOMY AND ADENOIDECTOMY       OB History   No obstetric history on file.      Home Medications    Prior to Admission medications   Medication Sig Start Date End Date Taking? Authorizing Provider  acetaminophen (TYLENOL) 650 MG CR tablet Take 1,300 mg by mouth every 12 (twelve) hours as needed for pain.     [provider]  albuterol (PROAIR HFA) 108 (90 Base) MCG/ACT inhaler Inhale 2 puffs into the lungs every 6 (six) hours as needed for wheezing or shortness of breath.    [provider]  albuterol (PROVENTIL) (2.5 MG/3ML) 0.083% nebulizer solution Take 2.5 mg by nebulization every 4 (four) hours as needed for wheezing or shortness of breath. PLAN C    [provider]  amLODipine (NORVASC) 10 MG tablet amlodipine 10 mg tablet    [provider]  ARIPiprazole (ABILIFY) 20 MG tablet Take 1 tablet (20 mg total) by mouth at bedtime. 08/27/14   Kerrie Buffalo, NP  ASPERCREME LIDOCAINE EX Apply 1 application topically 2  (two) times daily as needed (back pain).    [provider]  aspirin EC 81 MG EC tablet Take 1 tablet (81 mg total) by mouth daily. 06/15/17   Regalado, Belkys A, MD  atorvastatin (LIPITOR) 20 MG tablet Take 20 mg by mouth daily after supper.    [provider]  bisacodyl (DULCOLAX) 10 MG suppository Place 1 suppository (10 mg total) rectally daily as needed for moderate constipation. 09/08/16   Geradine Girt, DO  budesonide-formoterol (SYMBICORT) 160-4.5 MCG/ACT inhaler Inhale 2 puffs into the lungs 2 (two) times daily.    [provider]  Coenzyme Q10 (COQ10) 100 MG CAPS Take 100 mg by mouth every other day. Take 1 capsule by mouth every other day after supper (with atorvastatin)    [provider]  diclofenac (VOLTAREN) 75 MG EC tablet diclofenac sodium 75 mg tablet,delayed release  TK 1 T PO  BID  [provider]  donepezil (ARICEPT) 5 MG tablet Take 1 tablet (5 mg total) by mouth at bedtime. 08/27/14   Kerrie Buffalo, NP  dorzolamide-timolol (COSOPT) 22.3-6.8 MG/ML ophthalmic solution Place 1 drop into both eyes 2 (two) times daily.    [provider]  feeding supplement (BOOST / RESOURCE BREEZE) LIQD Take 1 Container by mouth 3 (three) times daily between meals. 07/13/17   Mariel Aloe, MD  fenofibrate 160 MG tablet fenofibrate 160 mg tablet  TK 1 T PO QD    [provider]  ferrous sulfate 325 (65 FE) MG tablet Take 325 mg by mouth daily after supper.    [provider]  furosemide (LASIX) 40 MG tablet Take 1 tablet (40 mg total) by mouth daily. 07/14/17   Mariel Aloe, MD  glimepiride (AMARYL) 4 MG tablet Take 0.5 tablets (2 mg total) by mouth daily with breakfast. 06/14/17   Regalado, Belkys A, MD  glucose blood (ACCU-CHEK AVIVA) test strip 1 each by Other route 2 (two) times daily. 08/04/16   Jearld Fenton, NP  hydrOXYzine (ATARAX/VISTARIL) 25 MG tablet hydroxyzine HCl 25 mg tablet    [provider]    Ipratropium-Albuterol (COMBIVENT RESPIMAT) 20-100 MCG/ACT AERS respimat Inhale 1-2 puffs into the lungs every 4 (four) hours as needed for wheezing. PLAN B     [provider]  irbesartan (AVAPRO) 300 MG tablet irbesartan 300 mg tablet    [provider]  lactulose (CHRONULAC) 10 GM/15ML solution Take 30 mLs (20 g total) by mouth 2 (two) times daily as needed for mild constipation. 09/08/16   Geradine Girt, DO  lamoTRIgine (LAMICTAL) 100 MG tablet Take 1 tablet (100 mg total) by mouth every evening. Patient taking differently: Take 100 mg by mouth daily after supper.  08/27/14   Kerrie Buffalo, NP  latanoprost (XALATAN) 0.005 % ophthalmic solution Place 1 drop into both eyes at bedtime. 08/27/14   Kerrie Buffalo, NP  Magnesium 250 MG TABS Take 250 mg by mouth See admin instructions. Take 1 tablet (250 mg) by mouth every other day after supper    [provider]  Menthol, Topical Analgesic, (BIOFREEZE EX) Apply 1 application topically 2 (two) times daily as needed (back pain).    [provider]  metFORMIN (GLUCOPHAGE) 1000 MG tablet Take 1 tablet (1,000 mg total) by mouth daily with breakfast. Patient taking differently: Take 1,000 mg by mouth daily with breakfast. If not eating well, give 500 mg twice daily instead of 1056m daily 07/13/17   NMariel Aloe MD  mirtazapine (REMERON) 7.5 MG tablet mirtazapine 7.5 mg tablet    [provider]  OPSUMIT 10 MG tablet  10/17/18   [provider]  OXYGEN Inhale 5 L into the lungs See admin instructions.     [provider]  Phenylephrine-APAP-Guaifenesin (MUCINEX SINUS-MAX) 10-650-400 MG/20ML LIQD Take 20 mLs by mouth 2 (two) times daily.    [provider]  polyethylene glycol (MIRALAX / GLYCOLAX) packet Take 17 g by mouth daily. Patient taking differently: Take 17 g by mouth daily as needed (for constipation). Mix in 4-8 oz liquid and drink 09/08/16   VGeradine Girt DO   Respiratory Therapy Supplies (FLUTTER) DEVI Use as directed. 05/26/17   Parrett, TFonnie Mu NP  Selenium Sulf-Pyrithione-Urea 2.25 % SHAM Apply 1 application topically See admin instructions. Apply topically to scalp for dermatitis once every 14 days 01/25/15   [provider]  Selenium Sulfide 2.25 % SHAM  selenium sulfide 2.25 % shampoo    [provider]  senna (SENOKOT) 8.6 MG tablet Take 1 tablet by mouth daily as needed for constipation.     [provider]  sildenafil (REVATIO) 20 MG tablet Take 1 tablet (20 mg total) by mouth 3 (three) times daily. 06/14/17   Regalado, Belkys A, MD  simvastatin (ZOCOR) 40 MG tablet simvastatin 40 mg tablet  TK 1 T PO D    [provider]  traZODone HCl (OLEPTRO PO) trazodone  200 mg; po qhs    [provider]    Family History Family History  Problem Relation Age of Onset  . Dementia Mother   . Alzheimer's disease Mother   . Heart disease Mother   . Hypertension Mother   . Diabetes Mother   . Diabetes Father   . Alzheimer's disease Sister   . Heart disease Brother   . Heart attack Brother   . Bladder Cancer Neg Hx   . Kidney cancer Neg Hx   . Prostate cancer Neg Hx     Social History Social History   Tobacco Use  . Smoking status: Former Smoker    Packs/day: 1.00    Years: 28.00    Pack years: 28.00    Types: Cigarettes    Last attempt to quit: 07/15/2015    Years since quitting: 3.4  . Smokeless tobacco: Never Used  Substance Use Topics  . Alcohol use: No    Alcohol/week: 0.0 standard drinks  . Drug use: No     Allergies   Codeine; Nitrostat [nitroglycerin]; and Prednisone   Review of Systems Review of Systems  All systems reviewed and negative, other than as noted in HPI.  Physical Exam Updated Vital Signs BP 134/87   Pulse 81   Resp 20   SpO2 100%   Physical Exam Vitals signs and nursing note reviewed.  Constitutional:      General: She is not in acute distress.     Appearance: She is well-developed.  HENT:     Head: Normocephalic and atraumatic.  Eyes:     General:        Right eye: No discharge.        Left eye: No discharge.     Conjunctiva/sclera: Conjunctivae normal.  Neck:     Musculoskeletal: Neck supple.  Cardiovascular:     Rate and Rhythm: Normal rate and regular rhythm.     Heart sounds: Normal heart sounds. No murmur. No friction rub. No gallop.   Pulmonary:     Effort: Pulmonary effort is normal. No respiratory distress.     Breath sounds: Normal breath sounds.  Abdominal:     General: There is no distension.     Palpations: Abdomen is soft.     Tenderness: There is no abdominal tenderness.  Musculoskeletal:        General: No tenderness.  Skin:    General: Skin is warm and dry.  Neurological:     Mental Status: She is alert.  Psychiatric:        Behavior: Behavior normal.        Thought Content: Thought content normal.      ED Treatments / Results  Labs (all labs ordered are listed, but only abnormal results are displayed) Labs Reviewed  CBC WITH DIFFERENTIAL/PLATELET - Abnormal; Notable for the following components:      Result Value   Hemoglobin 10.4 (*)    HCT 35.5 (*)    MCH 24.1 (*)  MCHC 29.3 (*)    RDW 17.3 (*)    Platelets 449 (*)    All other components within normal limits  BASIC METABOLIC PANEL - Abnormal; Notable for the following components:   Sodium 131 (*)    Chloride 96 (*)    BUN 26 (*)    Creatinine, Ser 2.28 (*)    GFR calc non Af Amer 21 (*)    GFR calc Af Amer 25 (*)    All other components within normal limits  POCT I-STAT EG7 - Abnormal; Notable for the following components:   pO2, Ven 22.0 (*)    Sodium 132 (*)    HCT 62.0 (*)    Hemoglobin 21.1 (*)    All other components within normal limits  BRAIN NATRIURETIC PEPTIDE  TROPONIN I  HEPARIN LEVEL (UNFRACTIONATED)  I-STAT VENOUS BLOOD GAS, ED  CBG MONITORING, ED    EKG EKG Interpretation  Date/Time:  Tuesday January 03 2019 12:57:39 EST Ventricular Rate:  87 PR Interval:    QRS Duration: 99 QT Interval:  401 QTC Calculation: 483 R Axis:   147 Text Interpretation:  Sinus rhythm Borderline prolonged PR interval  left atrial enlargement Low voltage, precordial leads Probable right ventricular hypertrophy Borderline abnrm T, anterolateral leads Baseline wander in lead(s) V4 V5 V6 Confirmed by Virgel Manifold [id number]) on 01/03/2019 2:40:48 PM   Radiology Dg Chest Portable 1 View  Result Date: 01/03/2019 CLINICAL DATA:  Increased shortness of breath EXAM: PORTABLE CHEST 1 VIEW COMPARISON:  07/11/2017 FINDINGS: Bilateral mild interstitial thickening. No pleural effusion or pneumothorax. Stable cardiomediastinal silhouette. No acute osseous abnormality. IMPRESSION: Mild bilateral interstitial thickening which may reflect chronic interstitial disease versus mild interstitial edema or infection. Electronically Signed   By: Kathreen Devoid   On: 01/03/2019 13:39    Procedures Procedures (including critical care time)  CRITICAL CARE Performed by: Virgel Manifold Total critical care time: 35 minutes Critical care time was exclusive of separately billable procedures and treating other patients. Critical care was necessary to treat or prevent imminent or life-threatening deterioration. Critical care was time spent personally by me on the following activities: development of treatment plan with patient and/or surrogate as well as nursing, discussions with consultants, evaluation of patient's response to treatment, examination of patient, obtaining history from patient or surrogate, ordering and performing treatments and interventions, ordering and review of laboratory studies, ordering and review of radiographic studies, pulse oximetry and re-evaluation of patient's condition.   Medications Ordered in ED Medications  azithromycin (ZITHROMAX) 500 mg in sodium chloride 0.9 % 250 mL IVPB (has no administration in time range)   cefTRIAXone (ROCEPHIN) 1 g in sodium chloride 0.9 % 100 mL IVPB (1 g Intravenous New Bag/Given 01/03/19 1418)  heparin bolus via infusion 4,500 Units (has no administration in time range)  heparin ADULT infusion 100 units/mL (25000 units/246m sodium chloride 0.45%) (has no administration in time range)  albuterol (PROVENTIL) (2.5 MG/3ML) 0.083% nebulizer solution 2.5 mg (2.5 mg Nebulization Given 01/03/19 1309)  methylPREDNISolone sodium succinate (SOLU-MEDROL) 125 mg/2 mL injection 125 mg (125 mg Intravenous Given 01/03/19 1413)     Initial Impression / Assessment and Plan / ED Course  I have reviewed the triage vital signs and the nursing notes.  Pertinent labs & imaging results that were available during my care of the patient were reviewed by me and considered in my medical decision making (see chart for details).     68year old female with pretty severe hypoxemia and high oxygen  requirement over her baseline.  Past history of COPD.  I really cannot appreciate much in terms of wheezing on my exam though.  Her high oxygen requirements though she was given a neb and steroids.  Antibiotics to cover for possible infectious etiology although radiograph is not overly impressive.  Renal function precludes CT angiography.  Cannot perform VQ scan on a nonrebreather.  She is empirically covered with heparin for possible PE.  Her anemia is stable.  Troponin is normal.  Radiology question possible edema on plain films.  She does not appear to be volume overloaded on exam though.  I do not appreciate crackles.  BNP is normal.  Final Clinical Impressions(s) / ED Diagnoses   Final diagnoses:  Acute respiratory failure with hypoxia [facility])    ED Discharge Orders    None       Virgel Manifold, MD 01/09/19 1028    Virgel Manifold, MD 01/22/19 [id number]

## 2019-01-03 NOTE — Consult Note (Signed)
NAME:  Elizabeth Thompson, MRN:  [id number], DOB:  [birth year], LOS: 0 ADMISSION DATE:  01/03/2019, CONSULTATION DATE:  01/03/2019 REFERRING MD:  EDP - [place], CHIEF COMPLAINT:  Acute on chronic hypoxemic respiratory failure   Brief History   68 year old female with PMH of pulmonary HTN who presents to the ED with acute on chronic hypoxemic respiratory failure.  Patient was c/o SOB x3 days and went to her primary where she was noted to have a saturation of 70% on home dose of O2 of 3-5L.  Patient was sent to the ED where she was started on HFNC and PCCM was asked to consult.  Patient denies fever/chills, N/V but does report cough that is not productive.  No sick contact or recent travel.  Patient is relatively sedentary.  Reports skipping her lasix yesterday and today.  History of present illness   68 year old female with PMH of pulmonary HTN who presents to the ED with acute on chronic hypoxemic respiratory failure.  Patient was c/o SOB x3 days and went to her primary where she was noted to have a saturation of 70% on home dose of O2 of 3-5L.  Patient was sent to the ED where she was started on HFNC and PCCM was asked to consult.  Patient denies fever/chills, N/V but does report cough that is not productive.  No sick contact or recent travel.  Patient is relatively sedentary.  Reports skipping her lasix yesterday and today  Past Medical History  COPD Pulmonary HTN  Significant Hospital Events   1/21 admission to the hospital for hypoxemia  Consults:  PCCM  Procedures:  N/A  Significant Diagnostic Tests:  CXR that I reviewed myself showing pulmonary edema  Micro Data:  Blood 1/21>>> RVP 1/21>>>  Antimicrobials:  Rocephin 1/21>>> Zithromax 1/21>>>   Interim history/subjective:  Feels better with increased O2  Objective   Blood pressure (!) 150/88, pulse 87, resp. rate (!) 21, height 5' 3.5" (1.613 m), weight 87.5 kg, SpO2 97 %.       No intake or output data in the 24 hours ending  01/03/19 1527 Filed Weights   01/03/19 1400  Weight: 87.5 kg    Examination: General: Chronically ill appearing female, NAD HENT: [place]/AT, PERRL, EOM-I and MMM Lungs: Bibasilar crackles Cardiovascular: RRR, Nl S1/S2 and -M/R/G Abdomen: Obese, distended, soft, NT and +BS Extremities: -edema and -tenderness Neuro: Alert and interactive, moving all ext to command Skin: Intact  Resolved Hospital Problem list   N/A  Assessment & Plan:  68 year old female with COPD and pulmonary HTN who presents for hypoxemia that is likely multi-factorial due to pulmonary HTN and pulmonary edema.  Discussed with EDP and PCCM-NP.  Acute pulmonary edema:  - Lasix 40 mg IV q6 x3 doses  - BMET in AM  Pulmonary HTN:  - Continue Opsumit  - Continue revatio  - Tele monitoring  Hypoxemia:  - Titrate o2 for sat of 88-92%  - Check D-dimer, if negative then will d/c heparin if positive then will need VQ scan  - Continue heparin for now  ?CAP:  - Not convincing on CXR  - Check procal, if negative then will d/c abx  - F/U on culture  - RVP  Labs   CBC: Recent Labs  Lab 01/03/19 1300 01/03/19 1343  WBC 5.5  --   NEUTROABS 3.2  --   HGB 10.4* 21.1*  HCT 35.5* 62.0*  MCV 82.4  --   PLT 449*  --  Basic Metabolic Panel: Recent Labs  Lab 01/03/19 1300 01/03/19 1343  NA 131* 132*  K 4.4 4.3  CL 96*  --   CO2 23  --   GLUCOSE 87  --   BUN 26*  --   CREATININE 2.28*  --   CALCIUM 10.0  --    GFR: Estimated Creatinine Clearance: 25.1 mL/min (A) (by C-G formula based on SCr of 2.28 mg/dL (H)). Recent Labs  Lab 01/03/19 1300  WBC 5.5    Liver Function Tests: No results for input(s): AST, ALT, ALKPHOS, BILITOT, PROT, ALBUMIN in the last 168 hours. No results for input(s): LIPASE, AMYLASE in the last 168 hours. No results for input(s): AMMONIA in the last 168 hours.  ABG    Component Value Date/Time   PHART 7.358 07/10/2017 2123   PCO2ART 40.3 07/10/2017 2123   PO2ART 52.1 (L)  07/10/2017 2123   HCO3 26.5 01/03/2019 1343   TCO2 28 01/03/2019 1343   ACIDBASEDEF 1.0 01/03/2019 1343   O2SAT 34.0 01/03/2019 1343     Coagulation Profile: No results for input(s): INR, PROTIME in the last 168 hours.  Cardiac Enzymes: Recent Labs  Lab 01/03/19 1300  TROPONINI <0.03    HbA1C: Hgb A1c MFr Bld  Date/Time Value Ref Range Status  02/02/2017 02:28 PM 5.4 4.8 - 5.6 % Final    Comment:    (NOTE)         Pre-diabetes: 5.7 - 6.4         Diabetes: >6.4         Glycemic control for adults with diabetes: <7.0   06/15/2016 07:57 AM 7.3 (H) 4.6 - 6.5 % Final    Comment:    Glycemic Control Guidelines for People with Diabetes:Non Diabetic:  <6%Goal of Therapy: <7%Additional Action Suggested:  >8%     CBG: Recent Labs  Lab 01/03/19 1430  GLUCAP 83    Review of Systems:   12 point ROS is negative other than mentioned above  Past Medical History  She,  has a past medical history of Acute respiratory failure ([place]) (06/09/2017), Arthritis, Back pain, Bell's palsy, Bipolar disorder ([place]), Bronchitis, Chronic low back pain (05/09/2015), COPD (chronic obstructive pulmonary disease) ([place]), Cough with expectoration (05/21/16), Cyst of right kidney, Diabetes mellitus without complication ([place]), Frequency of urination, GERD (gastroesophageal reflux disease), Glaucoma, History of blood transfusion, History of colon polyps, History of hiatal hernia, History of tobacco use, Hypercalcemia, Hyperlipidemia, Hypertension, Memory disorder (09/05/2014), On home oxygen therapy, Schizo-affective psychosis ([place]), Shortness of breath dyspnea, and Uterine fibroid.   Surgical History    Past Surgical History:  Procedure Laterality Date  . ABDOMINAL HYSTERECTOMY    . APPENDECTOMY    . COLONOSCOPY W/ POLYPECTOMY    . IR GENERIC HISTORICAL  04/29/2016   IR RADIOLOGIST EVAL & MGMT 04/29/2016 Corrie Mckusick, DO [facility] INTERV RAD  . IR GENERIC HISTORICAL  10/01/2016   IR RADIOLOGIST EVAL & MGMT  10/01/2016 [facility] INTERV RAD  . IR GENERIC HISTORICAL  11/11/2016   IR RADIOLOGIST EVAL & MGMT 11/11/2016 Corrie Mckusick, DO [facility] INTERV RAD  . IR GENERIC HISTORICAL  07/16/2016   IR RADIOLOGIST EVAL & MGMT 07/16/2016 Corrie Mckusick, DO [facility] INTERV RAD  . IR RADIOLOGIST EVAL & MGMT  03/10/2017  . IR RADIOLOGIST EVAL & MGMT  05/13/2017  . IR RADIOLOGIST EVAL & MGMT  04/27/2018  . LUMBAR LAMINECTOMY/DECOMPRESSION MICRODISCECTOMY N/A 08/14/2015   Procedure: LUMBAR DECOMPRESSION MICRODISCECTOMY L3-S1;  Surgeon: Melina Schools, MD;  Location[facility]  OR;  Service: Orthopedics;  Laterality: N/A;  . RADIOFREQUENCY ABLATION Left 02/05/2017   Procedure: LEFT RENAL CRYOABLATION;  Surgeon: Corrie Mckusick, DO;  Location: [facility] ORS;  Service: Anesthesiology;  Laterality: Left;  . RIGHT HEART CATH N/A 07/20/2017   Procedure: Right Heart Cath;  Surgeon: Nigel Mormon, MD;  Location: [place] CV LAB;  Service: Cardiovascular;  Laterality: N/A;  . RIGHT/LEFT HEART CATH AND CORONARY ANGIOGRAPHY N/A 06/11/2017   Procedure: Right/Left Heart Cath and Coronary Angiography;  Surgeon: Dixie Dials, MD;  Location: [place] [facility];  Service: Cardiovascular;  Laterality: N/A;  . TONSILLECTOMY AND ADENOIDECTOMY       Social History   reports that she quit smoking about 3 years ago. Her smoking use included cigarettes. She has a 28.00 pack-year smoking history. She has never used smokeless tobacco. She reports that she does not drink alcohol or use drugs.   Family History   Her family history includes Alzheimer's disease in her mother and sister; Dementia in her mother; Diabetes in her father and mother; Heart attack in her brother; Heart disease in her brother and mother; Hypertension in her mother. There is no history of Bladder Cancer, Kidney cancer, or Prostate cancer.   Allergies Allergies  Allergen Reactions  . Codeine Nausea And Vomiting  . Nitrostat [Nitroglycerin] Other (See Comments)    Pt is on revatio  .  Prednisone Other (See Comments)    Has to monitor because she is a diabetic      Home Medications  Prior to Admission medications   Medication Sig Start Date End Date Taking? Authorizing Provider  acetaminophen (TYLENOL) 650 MG CR tablet Take 1,300 mg by mouth every 12 (twelve) hours as needed for pain.     [provider]  albuterol (PROAIR HFA) 108 (90 Base) MCG/ACT inhaler Inhale 2 puffs into the lungs every 6 (six) hours as needed for wheezing or shortness of breath.    [provider]  albuterol (PROVENTIL) (2.5 MG/3ML) 0.083% nebulizer solution Take 2.5 mg by nebulization every 4 (four) hours as needed for wheezing or shortness of breath. PLAN C    [provider]  amLODipine (NORVASC) 10 MG tablet amlodipine 10 mg tablet    [provider]  ARIPiprazole (ABILIFY) 20 MG tablet Take 1 tablet (20 mg total) by mouth at bedtime. 08/27/14   Kerrie Buffalo, NP  ASPERCREME LIDOCAINE EX Apply 1 application topically 2 (two) times daily as needed (back pain).    [provider]  aspirin EC 81 MG EC tablet Take 1 tablet (81 mg total) by mouth daily. 06/15/17   Regalado, Belkys A, MD  atorvastatin (LIPITOR) 20 MG tablet Take 20 mg by mouth daily after supper.    [provider]  bisacodyl (DULCOLAX) 10 MG suppository Place 1 suppository (10 mg total) rectally daily as needed for moderate constipation. 09/08/16   Geradine Girt, DO  budesonide-formoterol (SYMBICORT) 160-4.5 MCG/ACT inhaler Inhale 2 puffs into the lungs 2 (two) times daily.    [provider]  Coenzyme Q10 (COQ10) 100 MG CAPS Take 100 mg by mouth every other day. Take 1 capsule by mouth every other day after supper (with atorvastatin)    [provider]  diclofenac (VOLTAREN) 75 MG EC tablet diclofenac sodium 75 mg tablet,delayed release  TK 1 T PO  BID    [provider]  donepezil (ARICEPT) 5 MG tablet Take 1 tablet (5 mg total) by mouth at bedtime. 08/27/14  Kerrie Buffalo, NP  dorzolamide-timolol (COSOPT) 22.3-6.8 MG/ML ophthalmic solution Place 1 drop into both eyes 2 (two) times daily.    [provider]  feeding supplement (BOOST / RESOURCE BREEZE) LIQD Take 1 Container by mouth 3 (three) times daily between meals. 07/13/17   Mariel Aloe, MD  fenofibrate 160 MG tablet fenofibrate 160 mg tablet  TK 1 T PO QD    [provider]  ferrous sulfate 325 (65 FE) MG tablet Take 325 mg by mouth daily after supper.    [provider]  furosemide (LASIX) 40 MG tablet Take 1 tablet (40 mg total) by mouth daily. 07/14/17   Mariel Aloe, MD  glimepiride (AMARYL) 4 MG tablet Take 0.5 tablets (2 mg total) by mouth daily with breakfast. 06/14/17   Regalado, Belkys A, MD  glucose blood (ACCU-CHEK AVIVA) test strip 1 each by Other route 2 (two) times daily. 08/04/16   Jearld Fenton, NP  hydrOXYzine (ATARAX/VISTARIL) 25 MG tablet hydroxyzine HCl 25 mg tablet    [provider]  Ipratropium-Albuterol (COMBIVENT RESPIMAT) 20-100 MCG/ACT AERS respimat Inhale 1-2 puffs into the lungs every 4 (four) hours as needed for wheezing. PLAN B     [provider]  irbesartan (AVAPRO) 300 MG tablet irbesartan 300 mg tablet    [provider]  lactulose (CHRONULAC) 10 GM/15ML solution Take 30 mLs (20 g total) by mouth 2 (two) times daily as needed for mild constipation. 09/08/16   Geradine Girt, DO  lamoTRIgine (LAMICTAL) 100 MG tablet Take 1 tablet (100 mg total) by mouth every evening. Patient taking differently: Take 100 mg by mouth daily after supper.  08/27/14   Kerrie Buffalo, NP  latanoprost (XALATAN) 0.005 % ophthalmic solution Place 1 drop into both eyes at bedtime. 08/27/14   Kerrie Buffalo, NP  Magnesium 250 MG TABS Take 250 mg by mouth See admin instructions. Take 1 tablet (250 mg) by mouth every other day after supper    [provider]  Menthol, Topical Analgesic, (BIOFREEZE EX) Apply 1 application  topically 2 (two) times daily as needed (back pain).    [provider]  metFORMIN (GLUCOPHAGE) 1000 MG tablet Take 1 tablet (1,000 mg total) by mouth daily with breakfast. Patient taking differently: Take 1,000 mg by mouth daily with breakfast. If not eating well, give 500 mg twice daily instead of 1036m daily 07/13/17   NMariel Aloe MD  mirtazapine (REMERON) 7.5 MG tablet mirtazapine 7.5 mg tablet    [provider]  OPSUMIT 10 MG tablet  10/17/18   [provider]  OXYGEN Inhale 5 L into the lungs See admin instructions.     [provider]  Phenylephrine-APAP-Guaifenesin (MUCINEX SINUS-MAX) 10-650-400 MG/20ML LIQD Take 20 mLs by mouth 2 (two) times daily.    [provider]  polyethylene glycol (MIRALAX / GLYCOLAX) packet Take 17 g by mouth daily. Patient taking differently: Take 17 g by mouth daily as needed (for constipation). Mix in 4-8 oz liquid and drink 09/08/16   VGeradine Girt DO  Respiratory Therapy Supplies (FLUTTER) DEVI Use as directed. 05/26/17   Parrett, TFonnie Mu NP  Selenium Sulf-Pyrithione-Urea 2.25 % SHAM Apply 1 application topically See admin instructions. Apply topically to scalp for dermatitis once every 14 days 01/25/15   [provider]  Selenium Sulfide 2.25 % SHAM selenium sulfide 2.25 % shampoo    [provider]  senna (SENOKOT) 8.6 MG tablet Take 1 tablet by mouth daily as  needed for constipation.     [provider]  sildenafil (REVATIO) 20 MG tablet Take 1 tablet (20 mg total) by mouth 3 (three) times daily. 06/14/17   Regalado, Belkys A, MD  simvastatin (ZOCOR) 40 MG tablet simvastatin 40 mg tablet  TK 1 T PO D    [provider]  traZODone HCl (OLEPTRO PO) trazodone  200 mg; po qhs    [provider]    PCCM will continue to follow  Rush Farmer, M.D. [facility]. Pager: [phone]. After hours pager: [phone].

## 2019-01-03 NOTE — H&P (Signed)
History and Physical    Elizabeth Thompson HCW:[id number] DOB: 04-13-1951 DOA: 01/03/2019  PCP: Everardo Beals, NP Consultants: None Patient coming from: Home- lives with  Chief Complaint: Dyspnea  HPI: Elizabeth Thompson is a 68 y.o. female with medical history significant for chronic hypoxic respiratory failure on 5 L of oxygen at home, hypertension, hyperlipidemia, COPD, severe pulmonary hypertension, diabetes mellitus type 2, schizo affective disorder who presented to the ED today with c/o acute on chronic dyspnea. She is here with her niece who is also her caregiver, [name]. Pt states she has been having worsening SOB for about 3 weeks. She started out with a dry cough that has now become productive of sometimes clear, sometimes greenish sputum. She states yesterday it was blood-tinged. She states the increased cough was what made her decide she needed to come to ED. She denies CP, palpitations, headache, fever/chills. No orthopnea or PND, no edema. No weight loss recently. Appetite is fair, lately has been worse. She has not been drinking as much as usual. She is compliant with all her meds. No known sick contacts.  ED Course: She was tachypneic, increased work of breathing with oxygen saturations in the low 60s on her baseline of 5 L nasal cannula.  On a nonrebreather at 12 L she maintained sats in the 90s.  She received Solu-Medrol 125 mg IV x1, albuterol 2.5 mg neb, Rocephin and azithromycin to empirically cover CAP, and empiric heparin drip to cover potential PE.  Review of Systems: As per HPI; otherwise review of systems reviewed and negative.   Past Medical History:  Diagnosis Date  . Acute respiratory failure ([place]) 06/09/2017  . Arthritis   . Back pain   . Bell's palsy   . Bipolar disorder ([place])   . Bronchitis   . Chronic low back pain 05/09/2015  . COPD (chronic obstructive pulmonary disease) ([place])    hyoxia during sleep- using O2 as needed  . Cough with expectoration 05/21/16   recent diagnosis of bronchitis  . Cyst of right kidney   . Diabetes mellitus without complication ([place])    type 2  . Frequency of urination   . GERD (gastroesophageal reflux disease)   . Glaucoma   . History of blood transfusion   . History of colon polyps   . History of hiatal hernia   . History of tobacco use   . Hypercalcemia   . Hyperlipidemia   . Hypertension   . Memory disorder 09/05/2014  . On home oxygen therapy    3L/M [place] at night   . Schizo-affective psychosis ([place])   . Shortness of breath dyspnea    exertion, or with out oxygen  . Uterine fibroid     Past Surgical History:  Procedure Laterality Date  . ABDOMINAL HYSTERECTOMY    . APPENDECTOMY    . COLONOSCOPY W/ POLYPECTOMY    . IR GENERIC HISTORICAL  04/29/2016   IR RADIOLOGIST EVAL & MGMT 04/29/2016 Corrie Mckusick, DO [facility] INTER[facility] RAD  . IR GENERIC HISTORICAL  10/01/2016   IR RADIOLOGIST EVAL & MGMT 10/01/2016 [facility] INTER[facility]  . IR GENERIC HISTORICAL  11/11/2016   IR RADIOLOGIST EVAL & MGMT 11/11/2016 Corrie Mckusick, DO [facility]  . IR GENERIC HISTORICAL  07/16/2016   IR RADIOLOGIST EVAL & MGMT 07/16/2016 Corrie Mckusick, DO [facility]  . IR RADIOLOGIST EVAL & MGMT  03/10/2017  . IR RADIOLOGIST EVAL & MGMT  05/13/2017  . IR RADIOLOGIST EVAL & MGMT  04/27/2018  .  LUMBAR LAMINECTOMY/DECOMPRESSION MICRODISCECTOMY N/A 08/14/2015   Procedure: LUMBAR DECOMPRESSION MICRODISCECTOMY L3-S1;  Surgeon: Melina Schools, MD;  Location: [place];  Service: Orthopedics;  Laterality: N/A;  . RADIOFREQUENCY ABLATION Left 02/05/2017   Procedure: LEFT RENAL CRYOABLATION;  Surgeon: Corrie Mckusick, DO;  Location: [facility];  Service: Anesthesiology;  Laterality: Left;  . RIGHT HEART CATH N/A 07/20/2017   Procedure: Right Heart Cath;  Surgeon: Nigel Mormon, MD;  Location: [facility];  Service: Cardiovascular;  Laterality: N/A;  . RIGHT/LEFT HEART CATH AND CORONARY ANGIOGRAPHY N/A 06/11/2017   Procedure: Right/Left Heart Cath and  Coronary Angiography;  Surgeon: Dixie Dials, MD;  Location: [facility];  Service: Cardiovascular;  Laterality: N/A;  . TONSILLECTOMY AND ADENOIDECTOMY      Social History   Socioeconomic History  . Marital status: Divorced    Spouse name: Not on file  . Number of children: 1  . Years of education: college 4  . Highest education level: Not on file  Occupational History  . Occupation: disabled  Social Needs  . Financial resource strain: Not on file  . Food insecurity:    Worry: Not on file    Inability: Not on file  . Transportation needs:    Medical: Not on file    Non-medical: Not on file  Tobacco Use  . Smoking status: Former Smoker    Packs/day: 1.00    Years: 28.00    Pack years: 28.00    Types: Cigarettes    Last attempt to quit: 07/15/2015    Years since quitting: 3.4  . Smokeless tobacco: Never Used  Substance and Sexual Activity  . Alcohol use: No    Alcohol/week: 0.0 standard drinks  . Drug use: No  . Sexual activity: Never    Birth control/protection: None  Lifestyle  . Physical activity:    Days per week: Not on file    Minutes per session: Not on file  . Stress: Not on file  Relationships  . Social connections:    Talks on phone: Not on file    Gets together: Not on file    Attends religious service: Not on file    Active member of club or organization: Not on file    Attends meetings of clubs or organizations: Not on file    Relationship status: Not on file  . Intimate partner violence:    Fear of current or ex partner: Not on file    Emotionally abused: Not on file    Physically abused: Not on file    Forced sexual activity: Not on file  Other Topics Concern  . Not on file  Social History Narrative   Patient is right handed.   Patient drinks 2 cups caffeine daily.    Allergies  Allergen Reactions  . Codeine Nausea And Vomiting  . Nitrostat [Nitroglycerin] Other (See Comments)    Pt is on revatio  . Prednisone Other (See Comments)     Has to monitor because she is a diabetic     Family History  Problem Relation Age of Onset  . Dementia Mother   . Alzheimer's disease Mother   . Heart disease Mother   . Hypertension Mother   . Diabetes Mother   . Diabetes Father   . Alzheimer's disease Sister   . Heart disease Brother   . Heart attack Brother   . Bladder Cancer Neg Hx   . Kidney cancer Neg Hx   . Prostate cancer Neg Hx  Prior to Admission medications   Medication Sig Start Date End Date Taking? Authorizing Provider  acetaminophen (TYLENOL) 650 MG CR tablet Take 1,300 mg by mouth every 12 (twelve) hours as needed for pain.     [provider]  albuterol (PROAIR HFA) 108 (90 Base) MCG/ACT inhaler Inhale 2 puffs into the lungs every 6 (six) hours as needed for wheezing or shortness of breath.    [provider]  albuterol (PROVENTIL) (2.5 MG/3ML) 0.083% nebulizer solution Take 2.5 mg by nebulization every 4 (four) hours as needed for wheezing or shortness of breath. PLAN C    [provider]  amLODipine (NORVASC) 10 MG tablet amlodipine 10 mg tablet    [provider]  ARIPiprazole (ABILIFY) 20 MG tablet Take 1 tablet (20 mg total) by mouth at bedtime. 08/27/14   Kerrie Buffalo, NP  ASPERCREME LIDOCAINE EX Apply 1 application topically 2 (two) times daily as needed (back pain).    [provider]  aspirin EC 81 MG EC tablet Take 1 tablet (81 mg total) by mouth daily. 06/15/17   Regalado, Belkys A, MD  atorvastatin (LIPITOR) 20 MG tablet Take 20 mg by mouth daily after supper.    [provider]  bisacodyl (DULCOLAX) 10 MG suppository Place 1 suppository (10 mg total) rectally daily as needed for moderate constipation. 09/08/16   Geradine Girt, DO  budesonide-formoterol (SYMBICORT) 160-4.5 MCG/ACT inhaler Inhale 2 puffs into the lungs 2 (two) times daily.    [provider]  Coenzyme Q10 (COQ10) 100 MG CAPS Take 100 mg by mouth every other day. Take 1  capsule by mouth every other day after supper (with atorvastatin)    [provider]  diclofenac (VOLTAREN) 75 MG EC tablet diclofenac sodium 75 mg tablet,delayed release  TK 1 T PO  BID    [provider]  donepezil (ARICEPT) 5 MG tablet Take 1 tablet (5 mg total) by mouth at bedtime. 08/27/14   Kerrie Buffalo, NP  dorzolamide-timolol (COSOPT) 22.3-6.8 MG/ML ophthalmic solution Place 1 drop into both eyes 2 (two) times daily.    [provider]  feeding supplement (BOOST / RESOURCE BREEZE) LIQD Take 1 Container by mouth 3 (three) times daily between meals. 07/13/17   Mariel Aloe, MD  fenofibrate 160 MG tablet fenofibrate 160 mg tablet  TK 1 T PO QD    [provider]  ferrous sulfate 325 (65 FE) MG tablet Take 325 mg by mouth daily after supper.    [provider]  furosemide (LASIX) 40 MG tablet Take 1 tablet (40 mg total) by mouth daily. 07/14/17   Mariel Aloe, MD  glimepiride (AMARYL) 4 MG tablet Take 0.5 tablets (2 mg total) by mouth daily with breakfast. 06/14/17   Regalado, Belkys A, MD  glucose blood (ACCU-CHEK AVIVA) test strip 1 each by Other route 2 (two) times daily. 08/04/16   Jearld Fenton, NP  hydrOXYzine (ATARAX/VISTARIL) 25 MG tablet hydroxyzine HCl 25 mg tablet    [provider]  Ipratropium-Albuterol (COMBIVENT RESPIMAT) 20-100 MCG/ACT AERS respimat Inhale 1-2 puffs into the lungs every 4 (four) hours as needed for wheezing. PLAN B     [provider]  irbesartan (AVAPRO) 300 MG tablet irbesartan 300 mg tablet    [provider]  lactulose (CHRONULAC) 10 GM/15ML solution Take 30 mLs (20 g total) by mouth 2 (two) times daily as needed for mild constipation. 09/08/16   Geradine Girt, DO  lamoTRIgine (LAMICTAL) 100  MG tablet Take 1 tablet (100 mg total) by mouth every evening. Patient taking differently: Take 100 mg by mouth daily after supper.  08/27/14   Kerrie Buffalo, NP  latanoprost (XALATAN) 0.005 %  ophthalmic solution Place 1 drop into both eyes at bedtime. 08/27/14   Kerrie Buffalo, NP  Magnesium 250 MG TABS Take 250 mg by mouth See admin instructions. Take 1 tablet (250 mg) by mouth every other day after supper    [provider]  Menthol, Topical Analgesic, (BIOFREEZE EX) Apply 1 application topically 2 (two) times daily as needed (back pain).    [provider]  metFORMIN (GLUCOPHAGE) 1000 MG tablet Take 1 tablet (1,000 mg total) by mouth daily with breakfast. Patient taking differently: Take 1,000 mg by mouth daily with breakfast. If not eating well, give 500 mg twice daily instead of 1058m daily 07/13/17   NMariel Aloe MD  mirtazapine (REMERON) 7.5 MG tablet mirtazapine 7.5 mg tablet    [provider]  OPSUMIT 10 MG tablet  10/17/18   [provider]  OXYGEN Inhale 5 L into the lungs See admin instructions.     [provider]  Phenylephrine-APAP-Guaifenesin (MUCINEX SINUS-MAX) 10-650-400 MG/20ML LIQD Take 20 mLs by mouth 2 (two) times daily.    [provider]  polyethylene glycol (MIRALAX / GLYCOLAX) packet Take 17 g by mouth daily. Patient taking differently: Take 17 g by mouth daily as needed (for constipation). Mix in 4-8 oz liquid and drink 09/08/16   VGeradine Girt DO  Respiratory Therapy Supplies (FLUTTER) DEVI Use as directed. 05/26/17   Parrett, TFonnie Mu NP  Selenium Sulf-Pyrithione-Urea 2.25 % SHAM Apply 1 application topically See admin instructions. Apply topically to scalp for dermatitis once every 14 days 01/25/15   [provider]  Selenium Sulfide 2.25 % SHAM selenium sulfide 2.25 % shampoo    [provider]  senna (SENOKOT) 8.6 MG tablet Take 1 tablet by mouth daily as needed for constipation.     [provider]  sildenafil (REVATIO) 20 MG tablet Take 1 tablet (20 mg total) by mouth 3 (three) times daily. 06/14/17   Regalado, Belkys A, MD  simvastatin (ZOCOR) 40 MG tablet simvastatin 40  mg tablet  TK 1 T PO D    [provider]  traZODone HCl (OLEPTRO PO) trazodone  200 mg; po qhs    [provider]    Physical Exam: Vitals:   01/03/19 1415 01/03/19 1445 01/03/19 1455 01/03/19 1515  BP: 130/67 133/65 113/65 (!) 150/88  Pulse: 82 86 87 87  Resp: 19 (!) 22 19 (!) 21  SpO2: 99% 100% 92% 97%  Weight:      Height:         . General: Appears calm and uncomfortable and is in NAD; under covers appears cold . Eyes:  PERRL, EOMI, normal lids, iris . ENT:  grossly normal hearing, lips & tongue, mmm . Neck:  supple, no lymphadenopathy . Cardiovascular:  nL S1, S2, normal rate, reg rhythm, no murmur. .Marland KitchenRespiratory:   CTA bilaterally although diminished throughout; with no wheezes/rales/rhonchi.  Normal respiratory effort. . Abdomen:  soft, NT, ND, NABS . Back:   grossly normal alignment . Skin:  no rash or lesions seen on limited exam . Musculoskeletal:  grossly normal tone BUE/BLE, good ROM, no bony abnormality or obvious joint deformity . Lower extremities:  No LE edema.  Limited foot exam with no ulcerations.  2+ distal pulses. .Marland KitchenPsychiatric:  grossly normal mood and affect, speech fluent and appropriate, AOx3 . Neurologic:  CN 2-12 grossly intact, moves all extremities in coordinated fashion, sensation intact, Patellar DTRs 2+ and symmetric    Radiological Exams on Admission: Dg Chest Portable 1 View  Result Date: 01/03/2019 CLINICAL DATA:  Increased shortness of breath EXAM: PORTABLE CHEST 1 VIEW COMPARISON:  07/11/2017 FINDINGS: Bilateral mild interstitial thickening. No pleural effusion or pneumothorax. Stable cardiomediastinal silhouette. No acute osseous abnormality. IMPRESSION: Mild bilateral interstitial thickening which may reflect chronic interstitial disease versus mild interstitial edema or infection. Electronically Signed   By: Kathreen Devoid   On: 01/03/2019 13:39    EKG: Independently reviewed.  Rate 87 Sinus rhythm Borderline prolonged  PR interval left atrial enlargement Low voltage, precordial leads Probable right ventricular hypertrophy Borderline abnrm T, anterolateral leads Baseline wander in lead(s) V4 V5 V6   Labs on Admission: I have personally reviewed the available labs and imaging studies at the time of the admission.  Pertinent labs:  VBG 7.337/49.04/04/25.5 Na 132 Creat 2.28 (baseline around 1.7 - 1.8) BUN 26 CO2 23 WBC 5.5 Hgb 10.4 Plt 449     Assessment/Plan Principal Problem:   Acute on chronic respiratory failure (HCC) Active Problems:   Schizoaffective disorder (HCC)   Diabetes mellitus type 2, controlled, without complications (HCC)   HLD (hyperlipidemia)   Essential hypertension   Dyspnea   Hypertension   COPD (chronic obstructive pulmonary disease) (HCC)   Diabetes mellitus without complication (HCC)   Pulmonary hypertension (HCC)   CKD (chronic kidney disease) stage 3, GFR 30-59 ml/min (HCC)   AKI (acute kidney injury) ([place])   Acute respiratory failure (HCC)    Acute on chronic hypoxic resp failure; posssiby due to atypical / viral PNA as CXR shows no consolidation. Pt reports productive sputum and worsening cough, worsening SOB. However no fever or leukocytosis. She does not appear volume overloaded and BNP is normal, but CXR shows some bilateral interstitial thickening which could be due to edema. She has little to no wheezing, doubt COPD exacerbation. PE is on differential but cannot get CTA due to renal function and can't get V/Q while on NRB. Will admit to stepdown. -cont NRB, wean O2 as able to keep sats >92 -cont abx rocephin and azithromycin to cover CAP -cont nebs, steroids to cover COPD exacerbation -PCCM has seen and feel pt has pulm edema as cause; they have ordered lasix 60 mg q6h x 3 doses -strict I/O, monitor lytes, BUN/creat -check TTE to help determine volume status -D-dimer sent; if negative, stop heparin. If positive, needs V/Q scan. Cont heparin for empiric Rx  of PE for now  AKI on CKD:  -check BMP in am; if creatinine worsens with IV diuresis would stop IV lasix and gently hydrate -avoid nephrotoxins -hold home ARB -strict I/O  HTN -cont home meds except ARB (due to AKI)  HLD -cont statin  DM2 -carb controlled diet -hold oral meds -SSI  Pulmonary HTN -cont Revatio -careful with volume management   DVT prophylaxis: lovenox Code Status:  Full - confirmed with patient/family Family Communication: niece at bedside [name])  Disposition Plan:  Home once clinically improved Consults called: PCCM  Admission status: Admit - It is my clinical opinion that admission to INPATIENT is reasonable and necessary because of the expectation that this patient will require hospital care that crosses at least 2 midnights to treat this condition based on the medical complexity of the problems presented.  Given the aforementioned information, the predictability of  an adverse outcome is felt to be significant.     Janora Norlander MD [facility]  If note is complete, please contact covering daytime or nighttime physician. [email or web address] Password [facility]  01/03/2019, 3:32 PM

## 2019-01-03 NOTE — ED Notes (Signed)
D-dimer needs to be recollected, RN informed

## 2019-01-03 NOTE — ED Triage Notes (Signed)
Pt here from home with c/o sob pt is on home O2 3 to 4 liters usually able to get her sats up but could not today hx of copd

## 2019-01-04 ENCOUNTER — Inpatient Hospital Stay (HOSPITAL_COMMUNITY): Payer: Medicare Other

## 2019-01-04 ENCOUNTER — Ambulatory Visit: Payer: Medicare Other | Admitting: Podiatry

## 2019-01-04 DIAGNOSIS — N184 Chronic kidney disease, stage 4 (severe): Secondary | ICD-10-CM

## 2019-01-04 DIAGNOSIS — R0902 Hypoxemia: Secondary | ICD-10-CM

## 2019-01-04 DIAGNOSIS — J9601 Acute respiratory failure with hypoxia: Secondary | ICD-10-CM

## 2019-01-04 DIAGNOSIS — I272 Pulmonary hypertension, unspecified: Secondary | ICD-10-CM | POA: Diagnosis not present

## 2019-01-04 LAB — RESPIRATORY PANEL BY PCR
Adenovirus: NOT DETECTED
Bordetella pertussis: NOT DETECTED
Chlamydophila pneumoniae: NOT DETECTED
Coronavirus 229E: NOT DETECTED
Coronavirus HKU1: NOT DETECTED
Coronavirus NL63: NOT DETECTED
Coronavirus OC43: NOT DETECTED
Influenza A: NOT DETECTED
Influenza B: NOT DETECTED
Metapneumovirus: NOT DETECTED
Mycoplasma pneumoniae: NOT DETECTED
Parainfluenza Virus 1: NOT DETECTED
Parainfluenza Virus 2: NOT DETECTED
Parainfluenza Virus 3: NOT DETECTED
Parainfluenza Virus 4: NOT DETECTED
RESPIRATORY SYNCYTIAL VIRUS-RVPPCR: NOT DETECTED
RHINOVIRUS / ENTEROVIRUS - RVPPCR: NOT DETECTED

## 2019-01-04 LAB — CBC
HCT: 34 % — ABNORMAL LOW (ref 36.0–46.0)
Hemoglobin: 9.8 g/dL — ABNORMAL LOW (ref 12.0–15.0)
MCH: 23.6 pg — ABNORMAL LOW (ref 26.0–34.0)
MCHC: 28.8 g/dL — ABNORMAL LOW (ref 30.0–36.0)
MCV: 81.9 fL (ref 80.0–100.0)
Platelets: 408 10*3/uL — ABNORMAL HIGH (ref 150–400)
RBC: 4.15 MIL/uL (ref 3.87–5.11)
RDW: 17.2 % — ABNORMAL HIGH (ref 11.5–15.5)
WBC: 2.9 10*3/uL — ABNORMAL LOW (ref 4.0–10.5)
nRBC: 0 % (ref 0.0–0.2)

## 2019-01-04 LAB — BASIC METABOLIC PANEL
Anion gap: 13 (ref 5–15)
BUN: 30 mg/dL — ABNORMAL HIGH (ref 8–23)
CO2: 23 mmol/L (ref 22–32)
Calcium: 10 mg/dL (ref 8.9–10.3)
Chloride: 98 mmol/L (ref 98–111)
Creatinine, Ser: 2.33 mg/dL — ABNORMAL HIGH (ref 0.44–1.00)
GFR calc Af Amer: 24 mL/min — ABNORMAL LOW (ref >60)
GFR calc non Af Amer: 21 mL/min — ABNORMAL LOW (ref >60)
Glucose, Bld: 139 mg/dL — ABNORMAL HIGH (ref 70–99)
Potassium: 4.4 mmol/L (ref 3.5–5.1)
Sodium: 134 mmol/L — ABNORMAL LOW (ref 135–145)

## 2019-01-04 LAB — HEPARIN LEVEL (UNFRACTIONATED)
Heparin Unfractionated: 0.64 IU/mL (ref 0.30–0.70)
Heparin Unfractionated: 0.62 IU/mL (ref 0.30–0.70)

## 2019-01-04 LAB — GLUCOSE, CAPILLARY
Glucose-Capillary: 121 mg/dL — ABNORMAL HIGH (ref 70–99)
Glucose-Capillary: 123 mg/dL — ABNORMAL HIGH (ref 70–99)
Glucose-Capillary: 139 mg/dL — ABNORMAL HIGH (ref 70–99)
Glucose-Capillary: 127 mg/dL — ABNORMAL HIGH (ref 70–99)

## 2019-01-04 LAB — HIV ANTIBODY (ROUTINE TESTING W REFLEX): HIV Screen 4th Generation wRfx: NONREACTIVE

## 2019-01-04 LAB — PROCALCITONIN: Procalcitonin: <0.1 ng/mL

## 2019-01-04 MED ORDER — ENOXAPARIN SODIUM 30 MG/0.3ML ~~LOC~~ SOLN
30.0000 mg | Freq: Every day | SUBCUTANEOUS | Status: DC
  Administered 2019-01-05 – 2019-01-11 (×7): 30 mg via SUBCUTANEOUS
  Filled 2019-01-04 (×7): qty 0.3

## 2019-01-04 MED ORDER — TECHNETIUM TC 99M DIETHYLENETRIAME-PENTAACETIC ACID
30.0000 | Freq: Once | INTRAVENOUS | Status: AC | PRN
  Administered 2019-01-04: 30 via RESPIRATORY_TRACT

## 2019-01-04 MED ORDER — TECHNETIUM TO 99M ALBUMIN AGGREGATED
4.0000 | Freq: Once | INTRAVENOUS | Status: AC | PRN
  Administered 2019-01-04: 4 via INTRAVENOUS

## 2019-01-04 NOTE — Progress Notes (Addendum)
ANTICOAGULATION CONSULT NOTE - Follow-up Consult  Pharmacy Consult for heparin Indication: r/o PE  Allergies  Allergen Reactions  . Codeine Nausea And Vomiting  . Nitrostat [Nitroglycerin] Other (See Comments)    Pt is on revatio  . Prednisone Other (See Comments)    Has to monitor because she is a diabetic     Patient Measurements: Height: 5' 3.5" (161.3 cm) Weight: 193 lb (87.5 kg) IBW/kg (Calculated) : 53.55 Heparin Dosing Weight: 73.1kg  Vital Signs: Temp: 98.5 F (36.9 C) (01/22 1228) Temp Source: Oral (01/22 1228) BP: 117/91 (01/22 1228) Pulse Rate: 79 (01/22 1228)  Labs: Recent Labs    01/03/19 1300 01/03/19 1343 01/03/19 2320 01/04/19 0325 01/04/19 1139  HGB 10.4* 21.1*  --  9.8*  --   HCT 35.5* 62.0*  --  34.0*  --   PLT 449*  --   --  408*  --   HEPARINUNFRC  --   --  0.24*  --  0.64  CREATININE 2.28*  --   --  2.33*  --   TROPONINI <0.03  --   --   --   --     Estimated Creatinine Clearance: 24.5 mL/min (A) (by C-G formula based on SCr of 2.33 mg/dL (H)).  Assessment: 16 yof presented to the ED with SOB. To start IV heparin for possible PE. She is not on antiocoagulation PTA.   Heparin level therapeutic (0.64) on gtt at 1400 units/hr. Hgb 9.8, platelets 408. No issues with line or bleeding reported per nurse.  Goal of Therapy:  Heparin level 0.3-0.7 units/ml Monitor platelets by anticoagulation protocol: Yes   Plan:  Continue heparin drip at 1400 units/hr Check an 8 hr confirmatory heparin level Monitor for s/sx of bleeding   Gwenlyn Found, Sherian Rein D PGY1 Pharmacy Resident  Phone [phone] 01/04/2019   1:35 PM  **Pharmacist phone directory can now be found on [email or web address] ([email or web address]).  Listed under [place]. 01/04/2019,1:33 PM

## 2019-01-04 NOTE — Progress Notes (Signed)
ANTICOAGULATION CONSULT NOTE - Follow-up Consult  Pharmacy Consult for heparin Indication: r/o PE  Allergies  Allergen Reactions  . Codeine Nausea And Vomiting  . Nitrostat [Nitroglycerin] Other (See Comments)    Pt is on revatio  . Prednisone Other (See Comments)    Has to monitor because she is a diabetic     Patient Measurements: Height: 5' 3.5" (161.3 cm) Weight: 193 lb (87.5 kg) IBW/kg (Calculated) : 53.55 Heparin Dosing Weight: 73.1kg  Vital Signs: Temp: 98.2 F (36.8 C) (01/22 0003) Temp Source: Axillary (01/22 0003) BP: 117/66 (01/22 0003) Pulse Rate: 88 (01/22 0003)  Labs: Recent Labs    01/03/19 1300 01/03/19 1343 01/03/19 2320  HGB 10.4* 21.1*  --   HCT 35.5* 62.0*  --   PLT 449*  --   --   HEPARINUNFRC  --   --  0.24*  CREATININE 2.28*  --   --   TROPONINI <0.03  --   --     Estimated Creatinine Clearance: 25.1 mL/min (A) (by C-G formula based on SCr of 2.28 mg/dL (H)).  Assessment: 39 yof presented to the ED with SOB. To start IV heparin for possible PE. Hgb and platelets are elevated. She is not on antiocoagulation PTA.   Heparin level subtherapeutic (0.24) on gtt at 1200 units/hr. No issues with line or bleeding reported per RN.  Goal of Therapy:  Heparin level 0.3-0.7 units/ml Monitor platelets by anticoagulation protocol: Yes   Plan:  Increase heparin gtt to 1400 units/hr Check an 8 hr heparin level F/u VQ scan  Sherlon Handing, PharmD, BCPS Clinical pharmacist  **Pharmacist phone directory can now be found on [email or web address] ([email or web address]).  Listed under [place]. 01/04/2019,1:34 AM

## 2019-01-04 NOTE — Progress Notes (Addendum)
NAME:  Elizabeth Thompson, MRN:  [id number], DOB:  1951-11-05, LOS: 1 ADMISSION DATE:  01/03/2019, CONSULTATION DATE:  01/03/2019 REFERRING MD:  EDP - [place], CHIEF COMPLAINT:  Acute on chronic hypoxemic respiratory failure   Brief History   68 year old female with PMH of pulmonary HTN who presents to the ED with acute on chronic hypoxemic respiratory failure.  Patient was c/o SOB x3 days and went to her primary where she was noted to have a saturation of 70% on home dose of O2 of 3-5L.  Patient was sent to the ED where she was started on HFNC and PCCM was asked to consult.  Patient denies fever/chills, N/V but does report cough that is not productive.  No sick contact or recent travel.  Patient is relatively sedentary.  Reports skipping her lasix yesterday and today.  Past Medical History  COPD Pulmonary HTN  Significant Hospital Events   1/21 admission to the hospital for hypoxemia  Consults:  PCCM  Procedures:  N/A  Significant Diagnostic Tests:     Micro Data:  Blood 1/21 >> RVP 1/21 >> negative   Antimicrobials:  Rocephin 1/21 >> Zithromax 1/21 >>   Interim history/subjective:  Pt reports she rested well overnight.  States breathing is better.  UOP 1.3L since admit.  No intake recorded.   Objective   Blood pressure 117/66, pulse 77, temperature 98.4 F (36.9 C), temperature source Oral, resp. rate 14, height 5' 3.5" (1.613 m), weight 87.5 kg, SpO2 99 %.        Intake/Output Summary (Last 24 hours) at 01/04/2019 0905 Last data filed at 01/04/2019 8242 Gross per 24 hour  Intake -  Output 1300 ml  Net -1300 ml   Filed Weights   01/03/19 1400  Weight: 87.5 kg    Examination: General: adult female lying in bed in NAD on 6L O2 HEENT: MM pink/moist Neuro: AAOx4, speech clear, MAE, normal strength  CV: s1s2 rrr, no m/r/g PULM: even/non-labored, lungs bilaterally clear anterior, few faint bibasilar crackles  PN:TIRW, non-tender, bsx4 active  Extremities: warm/dry, no  edema  Skin: no rashes or lesions  Resolved Hospital Problem list   N/A  Assessment & Plan:  68 year old female with COPD and pulmonary HTN who presents for hypoxemia that is likely multi-factorial due to pulmonary HTN and pulmonary edema.      Acute pulmonary edema P: Complete previously ordered lasix, hold further additional lasix 1/22.   Continue home dose of 40 mg PO QD  Follow BMP   Pulmonary HTN -followed by Dr. Einar Gip P: Continue home Opsumit, Revatio Tele monitoring  Will need outpatient follow up with Dr. Einar Gip post discharge  Acute on Chronic Hypoxemic Respiratory Failure  P: Wean O2 for sats 88-95% Await VQ scan   Continue heparin gtt for now   R/O CAP -negative PCT, no convincing airspace disease on initial CXR P: Follow intermittent CXR  Consider discontinuing abx, defer to primary    Labs   CBC: Recent Labs  Lab 01/03/19 1300 01/03/19 1343 01/04/19 0325  WBC 5.5  --  2.9*  NEUTROABS 3.2  --   --   HGB 10.4* 21.1* 9.8*  HCT 35.5* 62.0* 34.0*  MCV 82.4  --  81.9  PLT 449*  --  408*    Basic Metabolic Panel: Recent Labs  Lab 01/03/19 1300 01/03/19 1343 01/04/19 0325  NA 131* 132* 134*  K 4.4 4.3 4.4  CL 96*  --  98  CO2 23  --  23  GLUCOSE 87  --  139*  BUN 26*  --  30*  CREATININE 2.28*  --  2.33*  CALCIUM 10.0  --  10.0   GFR: Estimated Creatinine Clearance: 24.5 mL/min (A) (by C-G formula based on SCr of 2.33 mg/dL (H)). Recent Labs  Lab 01/03/19 1300 01/03/19 1544 01/04/19 0325  PROCALCITON  --  <0.10 <0.10  WBC 5.5  --  2.9*    Liver Function Tests: No results for input(s): AST, ALT, ALKPHOS, BILITOT, PROT, ALBUMIN in the last 168 hours. No results for input(s): LIPASE, AMYLASE in the last 168 hours. No results for input(s): AMMONIA in the last 168 hours.  ABG    Component Value Date/Time   PHART 7.358 07/10/2017 2123   PCO2ART 40.3 07/10/2017 2123   PO2ART 52.1 (L) 07/10/2017 2123   HCO3 26.5 01/03/2019 1343    TCO2 28 01/03/2019 1343   ACIDBASEDEF 1.0 01/03/2019 1343   O2SAT 34.0 01/03/2019 1343     Coagulation Profile: No results for input(s): INR, PROTIME in the last 168 hours.  Cardiac Enzymes: Recent Labs  Lab 01/03/19 1300  TROPONINI <0.03    HbA1C: Hgb A1c MFr Bld  Date/Time Value Ref Range Status  02/02/2017 02:28 PM 5.4 4.8 - 5.6 % Final    Comment:    (NOTE)         Pre-diabetes: 5.7 - 6.4         Diabetes: >6.4         Glycemic control for adults with diabetes: <7.0   06/15/2016 07:57 AM 7.3 (H) 4.6 - 6.5 % Final    Comment:    Glycemic Control Guidelines for People with Diabetes:Non Diabetic:  <6%Goal of Therapy: <7%Additional Action Suggested:  >8%     CBG: Recent Labs  Lab 01/03/19 1430 01/03/19 1941 01/03/19 2355 01/04/19 0608  GLUCAP 83 [place], NP-C [facility] Pgr: [phone] or if no answer [phone] 01/04/2019, 9:06 AM  Attending Note:  68 year old female with pulmonary HTN and COPD presenting with hypoxemia that is multifactorial with pulmonary HTN and pulmonary edema.  Patient feels better since admission.  On exam, decreased BS bibasilarIy.  I reviewed CXR myself, no acute disease noted.  Discussed with PCCM-NP.  Acute pulmonary edema:  - Continue active diureses, urinated 1.2 liters but did not documet input  - Decrease lasix to 40 mg IV q8 x2 doses  - BMET iN AM  Pulmonary HTN:  - Opsumit   - Revatio  - Tele monitoring  Hypoxemia:  - Titrate O2 for sat of 92-95%  - D-dimer positive (V/Q pending)  - Heparin drip per pharmacy  - F/U on V/Q scan  ?CAP: procal and CXR not convincing, RVP negative  - Recommend d/c of abx  PCCM will continue to follow  Patient seen and examined, agree with above note.  I dictated the care and orders written for this patient under my direction.  Rush Farmer, [place]

## 2019-01-04 NOTE — Plan of Care (Signed)
  Problem: Education: Goal: Knowledge of General Education information will improve Description Including pain rating scale, medication(s)/side effects and non-pharmacologic comfort measures Outcome: Progressing

## 2019-01-04 NOTE — Consult Note (Signed)
   [facility] CM Inpatient Consult   01/04/2019  JAMIRIA LANGILL 05/28/1951 [id number]  Patient screened for potential [facility] Care Management services due to unplanned readmission risk score of 31%, extreme.   Went to bedside to speak with patient and explain [facility] Care Management Services. Patient preparing for transport to radiology. Will attempt to engage a later time. Following for progression plans.  Netta Cedars, MSN, [facility] Liaison Nurse Mobile Phone [phone]  Toll free office [phone]

## 2019-01-04 NOTE — Progress Notes (Signed)
PROGRESS NOTE    Elizabeth Thompson  XAJ:[id number] DOB: 10/03/1951 DOA: 01/03/2019 PCP: Everardo Beals, NP  Outpatient Specialists:   Brief Narrative: Elizabeth Thompson is a 68 y.o. female with medical history significant for chronic hypoxic respiratory failure on 5 L of oxygen at home, hypertension, hyperlipidemia, COPD, severe pulmonary hypertension, diabetes mellitus type 2, chronic kidney disease stage IV and schizo affective disorder.  Patient presented to the hospital with acute on chronic dyspnea.  Apparently, patient has had worsening of shortness of breath for about 3 weeks, with associated cough that is intermittently productive of greenish sputum.  No fever or chills.  No sick contacts.  On presentation to the hospital, patient was tachypneic, with increased work of breathing and oxygen saturations in the low 60s on her baseline of 5 L nasal cannula.  On a nonrebreather at 12 L, patient maintained sats in the 90s.  On presentation to the ER, patient was treated with Solu-Medrol 125 mg IV x1, albuterol 2.5 mg neb, Rocephin and azithromycin to empirically cover CAP, and empiric heparin drip to cover potential PE.  01/04/2019: Patient was seen alongside patient's niece.  Patient is currently undergoing an echocardiogram.  Input from pulmonary team is highly appreciated.  Patient is on treatment for pulmonary hypertension.  Assessment & Plan:   Principal Problem:   Acute on chronic respiratory failure (HCC) Active Problems:   Schizoaffective disorder (HCC)   Diabetes mellitus type 2, controlled, without complications (HCC)   HLD (hyperlipidemia)   Essential hypertension   Dyspnea   Hypertension   COPD (chronic obstructive pulmonary disease) (HCC)   Diabetes mellitus without complication (HCC)   Pulmonary hypertension (HCC)   CKD (chronic kidney disease) stage 3, GFR 30-59 ml/min (HCC)   AKI (acute kidney injury) ([place])   Acute respiratory failure (HCC)  Acute on chronic hypoxic  resp failure:  -Patient has chronic pulmonary disease (severe pulmonary hypertension and query COPD). -Possible current viral syndrome. -Lung examination is not indicative of COPD exacerbation -Pulmonary input is highly appreciated -Continue current management -Follow echo report -Follow-up VQ scan -Is currently on heparin.  CKD IV: This is at baseline.  HTN -Blood pressure is optimized.    HLD -cont statin  DM2 -carb controlled diet -hold oral meds -SSI  Pulmonary HTN -cont Revatio and Macitentan (Opsumit) -Pulmonary input is highly appreciated.  DVT prophylaxis: lovenox Code Status:  Full - confirmed with patient/family Family Communication: niece at bedside [name])  Disposition Plan:  Home once clinically improved Consults called: PCCM   Procedures:   Echocardiogram  Antimicrobials:   IV Rocephin  IV azithromycin   Subjective: Shortness of breath is improving.  Objective: Vitals:   01/04/19 0003 01/04/19 0350 01/04/19 0817 01/04/19 1228  BP: 117/66   (!) 117/91  Pulse: 88 77  79  Resp: (!) 21 14  (!) 21  Temp: 98.2 F (36.8 C) 98.4 F (36.9 C)  98.5 F (36.9 C)  TempSrc: Axillary Oral  Oral  SpO2: 99% 100% 99% 98%  Weight:      Height:        Intake/Output Summary (Last 24 hours) at 01/04/2019 1424 Last data filed at 01/04/2019 0800 Gross per 24 hour  Intake -  Output 1580 ml  Net -1580 ml   Filed Weights   01/03/19 1400  Weight: 87.5 kg    Examination:  General exam: Appears calm and comfortable.  Pale. Respiratory system: Clear to auscultation.  Cardiovascular system: S1 & S2 heard. No pedal edema. Gastrointestinal  system: Abdomen is nondistended, soft and nontender. No organomegaly or masses felt. Normal bowel sounds heard. Central nervous system: Alert and oriented.  Patient moves all limbs.    Data Reviewed: I have personally reviewed following labs and imaging studies  CBC: Recent Labs  Lab 01/03/19 1300  01/03/19 1343 01/04/19 0325  WBC 5.5  --  2.9*  NEUTROABS 3.2  --   --   HGB 10.4* 21.1* 9.8*  HCT 35.5* 62.0* 34.0*  MCV 82.4  --  81.9  PLT 449*  --  517*   Basic Metabolic Panel: Recent Labs  Lab 01/03/19 1300 01/03/19 1343 01/04/19 0325  NA 131* 132* 134*  K 4.4 4.3 4.4  CL 96*  --  98  CO2 23  --  23  GLUCOSE 87  --  139*  BUN 26*  --  30*  CREATININE 2.28*  --  2.33*  CALCIUM 10.0  --  10.0   GFR: Estimated Creatinine Clearance: 24.5 mL/min (A) (by C-G formula based on SCr of 2.33 mg/dL (H)). Liver Function Tests: No results for input(s): AST, ALT, ALKPHOS, BILITOT, PROT, ALBUMIN in the last 168 hours. No results for input(s): LIPASE, AMYLASE in the last 168 hours. No results for input(s): AMMONIA in the last 168 hours. Coagulation Profile: No results for input(s): INR, PROTIME in the last 168 hours. Cardiac Enzymes: Recent Labs  Lab 01/03/19 1300  TROPONINI <0.03   BNP (last 3 results) No results for input(s): PROBNP in the last 8760 hours. HbA1C: No results for input(s): HGBA1C in the last 72 hours. CBG: Recent Labs  Lab 01/03/19 1430 01/03/19 1941 01/03/19 2355 01/04/19 0608 01/04/19 1121  GLUCAP 83 273* 172* 121* 123*   Lipid Profile: No results for input(s): CHOL, HDL, LDLCALC, TRIG, CHOLHDL, LDLDIRECT in the last 72 hours. Thyroid Function Tests: No results for input(s): TSH, T4TOTAL, FREET4, T3FREE, THYROIDAB in the last 72 hours. Anemia Panel: No results for input(s): VITAMINB12, FOLATE, FERRITIN, TIBC, IRON, RETICCTPCT in the last 72 hours. Urine analysis:    Component Value Date/Time   COLORURINE YELLOW 07/02/2017 [place] 07/02/2017 1825   LABSPEC 1.006 07/02/2017 1825   PHURINE 5.0 07/02/2017 1825   GLUCOSEU NEGATIVE 07/02/2017 1825   HGBUR NEGATIVE 07/02/2017 1825   BILIRUBINUR NEGATIVE 07/02/2017 1825   KETONESUR NEGATIVE 07/02/2017 1825   PROTEINUR 30 (A) 07/02/2017 1825   UROBILINOGEN 0.2 08/17/2015 0109    NITRITE NEGATIVE 07/02/2017 1825   LEUKOCYTESUR NEGATIVE 07/02/2017 1825   Sepsis Labs: _0 (procalcitonin:4,lacticidven:4)  ) Recent Results (from the past 240 hour(s))  Respiratory Panel by PCR     Status: None   Collection Time: 01/03/19  9:55 PM  Result Value Ref Range Status   Adenovirus NOT DETECTED NOT DETECTED Final   Coronavirus 229E NOT DETECTED NOT DETECTED Final   Coronavirus HKU1 NOT DETECTED NOT DETECTED Final   Coronavirus NL63 NOT DETECTED NOT DETECTED Final   Coronavirus OC43 NOT DETECTED NOT DETECTED Final   Metapneumovirus NOT DETECTED NOT DETECTED Final   Rhinovirus / Enterovirus NOT DETECTED NOT DETECTED Final   Influenza A NOT DETECTED NOT DETECTED Final   Influenza B NOT DETECTED NOT DETECTED Final   Parainfluenza Virus 1 NOT DETECTED NOT DETECTED Final   Parainfluenza Virus 2 NOT DETECTED NOT DETECTED Final   Parainfluenza Virus 3 NOT DETECTED NOT DETECTED Final   Parainfluenza Virus 4 NOT DETECTED NOT DETECTED Final   Respiratory Syncytial Virus NOT DETECTED NOT DETECTED Final   Bordetella pertussis  NOT DETECTED NOT DETECTED Final   Chlamydophila pneumoniae NOT DETECTED NOT DETECTED Final   Mycoplasma pneumoniae NOT DETECTED NOT DETECTED Final    Comment: Performed at [facility], [place] [address] [place], [place] [address]         Radiology Studies: Dg Chest Portable 1 View  Result Date: 01/03/2019 CLINICAL DATA:  Increased shortness of breath EXAM: PORTABLE CHEST 1 VIEW COMPARISON:  07/11/2017 FINDINGS: Bilateral mild interstitial thickening. No pleural effusion or pneumothorax. Stable cardiomediastinal silhouette. No acute osseous abnormality. IMPRESSION: Mild bilateral interstitial thickening which may reflect chronic interstitial disease versus mild interstitial edema or infection. Electronically Signed   By: Kathreen Devoid   On: 01/03/2019 13:39        Scheduled Meds: . amLODipine  10 mg Oral Daily  . ARIPiprazole  20 mg Oral QHS   . aspirin EC  81 mg Oral Daily  . atorvastatin  20 mg Oral QPC supper  . donepezil  5 mg Oral QHS  . dorzolamide-timolol  1 drop Both Eyes BID  . fenofibrate  160 mg Oral Daily  . ferrous sulfate  325 mg Oral QPC supper  . furosemide  40 mg Oral Daily  . guaiFENesin  600 mg Oral BID  . insulin aspart  0-5 Units Subcutaneous QHS  . insulin aspart  0-9 Units Subcutaneous TID WC  . lamoTRIgine  100 mg Oral QPC supper  . latanoprost  1 drop Both Eyes QHS  . macitentan  10 mg Oral Daily  . magnesium oxide  400 mg Oral Q48H  . mirtazapine  7.5 mg Oral QHS  . mometasone-formoterol  2 puff Inhalation BID  . predniSONE  40 mg Oral Q breakfast  . sildenafil  20 mg Oral TID  . simvastatin  40 mg Oral q1800   Continuous Infusions: . azithromycin (ZITHROMAX) 500 MG IVPB (Vial-Mate [employer]) 500 mg (01/04/19 1323)  . cefTRIAXone (ROCEPHIN)  IV 1 g (01/04/19 1238)  . heparin 1,400 Units/hr (01/04/19 0700)     LOS: 1 day    Time spent: 35 minutes.    Dana Allan, MD  [facility] Pager #: [phone] 7PM-7AM contact night coverage as above

## 2019-01-05 ENCOUNTER — Inpatient Hospital Stay (HOSPITAL_COMMUNITY): Payer: Medicare Other

## 2019-01-05 DIAGNOSIS — I5033 Acute on chronic diastolic (congestive) heart failure: Secondary | ICD-10-CM

## 2019-01-05 DIAGNOSIS — J9622 Acute and chronic respiratory failure with hypercapnia: Secondary | ICD-10-CM

## 2019-01-05 DIAGNOSIS — I2699 Other pulmonary embolism without acute cor pulmonale: Secondary | ICD-10-CM

## 2019-01-05 LAB — PROCALCITONIN: Procalcitonin: <0.1 ng/mL

## 2019-01-05 LAB — GLUCOSE, CAPILLARY
Glucose-Capillary: 88 mg/dL (ref 70–99)
Glucose-Capillary: 77 mg/dL (ref 70–99)
Glucose-Capillary: 143 mg/dL — ABNORMAL HIGH (ref 70–99)
Glucose-Capillary: 237 mg/dL — ABNORMAL HIGH (ref 70–99)

## 2019-01-05 MED ORDER — PNEUMOCOCCAL VAC POLYVALENT 25 MCG/0.5ML IJ INJ
0.5000 mL | INJECTION | INTRAMUSCULAR | Status: AC
  Administered 2019-01-08: 0.5 mL via INTRAMUSCULAR
  Filled 2019-01-05: qty 0.5

## 2019-01-05 MED ORDER — FUROSEMIDE 10 MG/ML IJ SOLN
80.0000 mg | Freq: Two times a day (BID) | INTRAMUSCULAR | Status: DC
  Administered 2019-01-05 – 2019-01-07 (×5): 80 mg via INTRAVENOUS
  Filled 2019-01-05 (×5): qty 8

## 2019-01-05 NOTE — Progress Notes (Signed)
PROGRESS NOTE    Elizabeth Thompson  JME:[id number] DOB: Feb 25, 1951 DOA: 01/03/2019 PCP: Everardo Beals, NP  Outpatient Specialists:   Brief Narrative: Elizabeth Thompson is a 68 y.o. female with medical history significant for chronic hypoxic respiratory failure on 5 L of oxygen at home, hypertension, hyperlipidemia, COPD, severe pulmonary hypertension, diabetes mellitus type 2, chronic kidney disease stage IV and schizo affective disorder.  Patient presented to the hospital with acute on chronic dyspnea.  Apparently, patient has had worsening of shortness of breath for about 3 weeks, with associated cough that is intermittently productive of greenish sputum.  No fever or chills.  No sick contacts.  On presentation to the hospital, patient was tachypneic, with increased work of breathing and oxygen saturations in the low 60s on her baseline of 5 L nasal cannula.  On a nonrebreather at 12 L, patient maintained sats in the 90s.  On presentation to the ER, patient was treated with Solu-Medrol 125 mg IV x1, albuterol 2.5 mg neb, Rocephin and azithromycin to empirically cover CAP, and empiric heparin drip to cover potential PE.  01/04/2019: Patient was seen alongside patient's niece.  Patient is currently undergoing an echocardiogram.  Input from pulmonary team is highly appreciated.  Patient is on treatment for pulmonary hypertension.  01/05/2019: Patient seen.  Oxygen requirement is down to 6 L/min via nasal cannula.  However, patient remains dyspneic on minimal exertion.  Pulmonary input is appreciated.  Aggressive diuresis for now.  Monitor renal function, electrolytes and blood pressure.  Other management will depend on hospital course.  Assessment & Plan:   Principal Problem:   Acute on chronic respiratory failure (HCC) Active Problems:   Schizoaffective disorder (HCC)   Diabetes mellitus type 2, controlled, without complications (HCC)   HLD (hyperlipidemia)   Essential hypertension    Dyspnea   Hypertension   COPD (chronic obstructive pulmonary disease) (HCC)   Diabetes mellitus without complication (HCC)   Pulmonary hypertension (HCC)   CKD (chronic kidney disease) stage 3, GFR 30-59 ml/min (HCC)   AKI (acute kidney injury) ([place])   Acute respiratory failure (HCC)  Acute on chronic combined hypercapnic hypoxic resp failure:  -Patient has chronic pulmonary disease (severe pulmonary hypertension and query COPD). -Possible current viral syndrome. -Lung examination is not indicative of COPD exacerbation -Pulmonary input is highly appreciated -Continue current management -Follow echo report -Follow-up VQ scan -Is currently on heparin. 01/05/2019: Pulmonary team is directing care.  ABG in the morning as per pulmonary team.  Stable outpatient sleep study.  Follow echocardiogram.  Pursue VQ scan.  CKD IV: This is at baseline.  HTN -Blood pressure is optimized.    HLD -cont statin  DM2 -carb controlled diet -hold oral meds -SSI  Pulmonary HTN -cont Revatio and Macitentan (Opsumit) -Pulmonary input is highly appreciated.  Possible pulmonary edema/acute on chronic diastolic CHF: Aggressive diuresis.    DVT prophylaxis: lovenox Code Status:  Full - confirmed with patient/family Family Communication: niece at bedside Elizabeth Thompson)  Disposition Plan:  Home once clinically improved Consults called: PCCM   Procedures:   Echocardiogram  Antimicrobials:   IV Rocephin discontinued.  IV azithromycin was continued.   Subjective: Still dyspneic with minimal exertion.   Objective: Vitals:   01/05/19 0735 01/05/19 0919 01/05/19 1246 01/05/19 1307  BP: 120/65  (!) 105/52 (!) 123/95  Pulse: 88 75 79 78  Resp: _0 Temp: 97.6 F (36.4 C)  97.6 F (36.4 C)   TempSrc: Axillary  Oral  SpO2: 99% 97% 100% 100%  Weight:      Height:        Intake/Output Summary (Last 24 hours) at 01/05/2019 1623 Last data filed at 01/05/2019 0919 Gross per 24 hour   Intake 444.54 ml  Output 945 ml  Net -500.46 ml   Filed Weights   01/03/19 1400  Weight: 87.5 kg    Examination:  General exam: Appears calm and comfortable.  Pale. Respiratory system: Decreased air entry globally. Cardiovascular system: S1 & S2 heard. No pedal edema. Gastrointestinal system: Abdomen is nondistended, soft and nontender. No organomegaly or masses felt. Normal bowel sounds heard. Central nervous system: Alert and oriented.  Patient moves all limbs.    Data Reviewed: I have personally reviewed following labs and imaging studies  CBC: Recent Labs  Lab 01/03/19 1300 01/03/19 1343 01/04/19 0325  WBC 5.5  --  2.9*  NEUTROABS 3.2  --   --   HGB 10.4* 21.1* 9.8*  HCT 35.5* 62.0* 34.0*  MCV 82.4  --  81.9  PLT 449*  --  275*   Basic Metabolic Panel: Recent Labs  Lab 01/03/19 1300 01/03/19 1343 01/04/19 0325  NA 131* 132* 134*  K 4.4 4.3 4.4  CL 96*  --  98  CO2 23  --  23  GLUCOSE 87  --  139*  BUN 26*  --  30*  CREATININE 2.28*  --  2.33*  CALCIUM 10.0  --  10.0   GFR: Estimated Creatinine Clearance: 24.5 mL/min (A) (by C-G formula based on SCr of 2.33 mg/dL (H)). Liver Function Tests: No results for input(s): AST, ALT, ALKPHOS, BILITOT, PROT, ALBUMIN in the last 168 hours. No results for input(s): LIPASE, AMYLASE in the last 168 hours. No results for input(s): AMMONIA in the last 168 hours. Coagulation Profile: No results for input(s): INR, PROTIME in the last 168 hours. Cardiac Enzymes: Recent Labs  Lab 01/03/19 1300  TROPONINI <0.03   BNP (last 3 results) No results for input(s): PROBNP in the last 8760 hours. HbA1C: No results for input(s): HGBA1C in the last 72 hours. CBG: Recent Labs  Lab 01/04/19 1121 01/04/19 1634 01/04/19 2101 01/05/19 0545 01/05/19 1116  GLUCAP 123* 139* 127* 88 77   Lipid Profile: No results for input(s): CHOL, HDL, LDLCALC, TRIG, CHOLHDL, LDLDIRECT in the last 72 hours. Thyroid Function Tests: No  results for input(s): TSH, T4TOTAL, FREET4, T3FREE, THYROIDAB in the last 72 hours. Anemia Panel: No results for input(s): VITAMINB12, FOLATE, FERRITIN, TIBC, IRON, RETICCTPCT in the last 72 hours. Urine analysis:    Component Value Date/Time   COLORURINE YELLOW 07/02/2017 1825   APPEARANCEUR CLEAR 07/02/2017 1825   LABSPEC 1.006 07/02/2017 1825   PHURINE 5.0 07/02/2017 1825   GLUCOSEU NEGATIVE 07/02/2017 1825   HGBUR NEGATIVE 07/02/2017 1825   BILIRUBINUR NEGATIVE 07/02/2017 1825   KETONESUR NEGATIVE 07/02/2017 1825   PROTEINUR 30 (A) 07/02/2017 1825   UROBILINOGEN 0.2 08/17/2015 0109   NITRITE NEGATIVE 07/02/2017 1825   LEUKOCYTESUR NEGATIVE 07/02/2017 1825   Sepsis Labs: _0 (procalcitonin:4,lacticidven:4)  ) Recent Results (from the past 240 hour(s))  Respiratory Panel by PCR     Status: None   Collection Time: 01/03/19  9:55 PM  Result Value Ref Range Status   Adenovirus NOT DETECTED NOT DETECTED Final   Coronavirus 229E NOT DETECTED NOT DETECTED Final   Coronavirus HKU1 NOT DETECTED NOT DETECTED Final   Coronavirus NL63 NOT DETECTED NOT DETECTED Final   Coronavirus OC43 NOT DETECTED NOT DETECTED  Final   Metapneumovirus NOT DETECTED NOT DETECTED Final   Rhinovirus / Enterovirus NOT DETECTED NOT DETECTED Final   Influenza A NOT DETECTED NOT DETECTED Final   Influenza B NOT DETECTED NOT DETECTED Final   Parainfluenza Virus 1 NOT DETECTED NOT DETECTED Final   Parainfluenza Virus 2 NOT DETECTED NOT DETECTED Final   Parainfluenza Virus 3 NOT DETECTED NOT DETECTED Final   Parainfluenza Virus 4 NOT DETECTED NOT DETECTED Final   Respiratory Syncytial Virus NOT DETECTED NOT DETECTED Final   Bordetella pertussis NOT DETECTED NOT DETECTED Final   Chlamydophila pneumoniae NOT DETECTED NOT DETECTED Final   Mycoplasma pneumoniae NOT DETECTED NOT DETECTED Final    Comment: Performed at [facility], [place] [address], [place], [place] [address]         Radiology  Studies: Nm Pulmonary Vent And Perf (v/q Scan)  Result Date: 01/04/2019 CLINICAL DATA:  Acute respiratory failure with cough. High probability for acute pulmonary embolism. On heparin therapy without history of pulmonary embolism. History of pulmonary hypertension. EXAM: NUCLEAR MEDICINE VENTILATION - PERFUSION LUNG SCAN TECHNIQUE: Ventilation images were obtained in multiple projections using inhaled aerosol Tc-18mDTPA. Perfusion images were obtained in multiple projections after intravenous injection of Tc-974mAA. RADIOPHARMACEUTICALS:  31.0 mCi of Tc-9915mPA aerosol inhalation and 4.3 mCi Tc99m64m IV COMPARISON:  Portable chest 01/03/2019. FINDINGS: Ventilation: Central clumping of the aerosol in the airways. No focal ventilatory defects. Perfusion: No wedge shaped peripheral perfusion defects to suggest acute pulmonary embolism. IMPRESSION: Very low probability for acute pulmonary embolism. Electronically Signed   By: WillRichardean Sale.   On: 01/04/2019 14:42   Dg Chest Port 1 View  Result Date: 01/05/2019 CLINICAL DATA:  Acute respiratory failure with hypoxia. EXAM: PORTABLE CHEST 1 VIEW COMPARISON:  01/03/2019, 07/11/2017 and chest CT 06/09/2017 FINDINGS: Heart size is mildly prominent but may be accentuated by the AP technique. Again noted are prominent lung markings, particularly at the right lung base. Coarse interstitial lung markings are chronic and patient has underlying emphysema based on previous chest CT. No focal airspace disease or consolidation. Trachea is midline. Negative for a pneumothorax. No acute bone abnormality. IMPRESSION: Chronic lung changes without clear evidence of an acute process. Electronically Signed   By: AdamMarkus Daft.   On: 01/05/2019 08:39        Scheduled Meds: . amLODipine  10 mg Oral Daily  . ARIPiprazole  20 mg Oral QHS  . aspirin EC  81 mg Oral Daily  . atorvastatin  20 mg Oral QPC supper  . donepezil  5 mg Oral QHS  . dorzolamide-timolol  1 drop  Both Eyes BID  . enoxaparin (LOVENOX) injection  30 mg Subcutaneous Daily  . fenofibrate  160 mg Oral Daily  . ferrous sulfate  325 mg Oral QPC supper  . furosemide  80 mg Intravenous BID  . guaiFENesin  600 mg Oral BID  . insulin aspart  0-5 Units Subcutaneous QHS  . insulin aspart  0-9 Units Subcutaneous TID WC  . lamoTRIgine  100 mg Oral QPC supper  . latanoprost  1 drop Both Eyes QHS  . macitentan  10 mg Oral Daily  . magnesium oxide  400 mg Oral Q48H  . mirtazapine  7.5 mg Oral QHS  . mometasone-formoterol  2 puff Inhalation BID  . predniSONE  40 mg Oral Q breakfast  . sildenafil  20 mg Oral TID  . simvastatin  40 mg Oral q1800   Continuous Infusions:  LOS: 2 days    Time spent: 35 minutes.    Dana Allan, MD  [facility] Pager #: [phone] 7PM-7AM contact night coverage as above

## 2019-01-05 NOTE — Progress Notes (Addendum)
NAME:  LAIYAH EXLINE, MRN:  [id number], DOB:  08-Apr-1951, LOS: 2 ADMISSION DATE:  01/03/2019, CONSULTATION DATE:  01/03/2019 REFERRING MD:  EDP - [place], CHIEF COMPLAINT:  Acute on chronic hypoxemic respiratory failure   Brief History   68 year old female with PMH of pulmonary HTN who presents to the ED with acute on chronic hypoxemic respiratory failure.  Patient was c/o SOB x3 days and went to her primary where she was noted to have a saturation of 70% on home dose of O2 of 3-5L.  Patient was sent to the ED where she was started on HFNC and PCCM was asked to consult.  Patient denies fever/chills, N/V but does report cough that is not productive.  No sick contact or recent travel.  Patient is relatively sedentary.  Reports skipping her lasix yesterday and today.  Past Medical History  COPD Pulmonary HTN  Significant Hospital Events   1/21 admission to the hospital for hypoxemia  Consults:  PCCM  Procedures:  N/A  Significant Diagnostic Tests:     Micro Data:  Blood 1/21 >> RVP 1/21 >> negative   Antimicrobials:  Rocephin 1/21 >> 1/22 Zithromax 1/21 >> 1/22  Interim history/subjective:  Feels a little better  Objective   Blood pressure 120/65, pulse 75, temperature 97.6 F (36.4 C), temperature source Axillary, resp. rate 16, height 5' 3.5" (1.613 m), weight 87.5 kg, SpO2 97 %.        Intake/Output Summary (Last 24 hours) at 01/05/2019 1005 Last data filed at 01/05/2019 0919 Gross per 24 hour  Intake 444.54 ml  Output 945 ml  Net -500.46 ml   Filed Weights   01/03/19 1400  Weight: 87.5 kg    Examination:  General: 68 year old female patient actually appears much older than stated age but she is in no acute distress HEENT normocephalic atraumatic no jugular venous distention mucous membranes are moist Pulmonary: Clear to auscultation diminished bases Cardiac: Regular rate and rhythm Abdomen: Soft nontender no organomegaly Extremities: No significant edema  warm dry strong pulses Neuro: Sleepy but alert and oriented no focal deficits GU: Voids spontaneously  Resolved Hospital Problem list   N/A  Assessment & Plan:  68 year old female with h/o asthma and pulmonary HTN who presents for hypoxemia that is likely multi-factorial due to pulmonary HTN and pulmonary edema.     Acute on Chronic Hypoxemic Respiratory Failure  Acute pulmonary edema Pulmonary HTN Decompensated Cor Pulmonale R/o chronic thromboembolic disease   Discussion: She has been followed by our clinic since 2017.  Her PFTs do not suggest COPD, however she does have a history of asthma.  She is chronically hypoxic, I am concerned that her baseline degree of hypoxia is probably contributing to her acute decompensation.  She typically desaturates with activity, I suspect also nocturnally desaturates.  Her last sleep study was back in 2017 at that point there was no observed sleep apnea.  I am still concerned at least about central sleep apnea and also would be concerned about simply obesity hypoventilation syndrome or similar physiology.  Plan Walking oximetry, need to determine to what degree she desaturates with activity, oxygen will need to be titrated appropriately to avoid hypoxia Continue diuresis as long as her BUN and creatinine tolerate Nocturnal pulse oximetry assessing for desaturation at night A.m. arterial blood gas looking for baseline hypercarbia; I think she might benefit from nocturnal ventilation to unload her ventricle and help prevent hypoxia Consider outpatient sleep study, however if she is hypercarbic  may be able to avoid that   Erick Colace ACNP-BC [place] Pager # [phone] OR # [phone] if no answer  Attending Note:  68 year old female with history of pulmonary HTN presenting with acute on chronic hypoxemic respiratory failure due to pulmonary edema.  No events overnight and O2 demand is improving.  I am suspicious that patient  may have a strong OSA component.  On exam, lungs with bibasilar crackles.  I reviewed CXR myself, pulmonary edema noted.  Discussed with PCCM-NP.  Pulmonary HTN:  - Continue home medications as ordered  - No change in home medication  - Echo pending  ?PE:  - VQ scan to be done today  - Continue heparin for now  - D/C if negative or low probability  Chronic respiratory failure: hypercarbic and hypoxemic  - ABG in AM  - If hypercarbic in AM will obtain BiPAP for home or trilogy vent  Acute pulmonary edema:  - Lasix ordered  Deconditioning:  - PT evaluation  Hypoxemia:  - Titrate O2 for sat of 90-92%  - Will need an ambulatory desaturation study when closer to discharge for home O2 level needs  OSA:  - If no CO2 retention in AM ABG then will need an outpatient sleep study  PCCM will continue to follow  Patient seen and examined, agree with above note.  I dictated the care and orders written for this patient under my direction.  Rush Farmer, [place]

## 2019-01-06 DIAGNOSIS — G4733 Obstructive sleep apnea (adult) (pediatric): Secondary | ICD-10-CM

## 2019-01-06 LAB — RENAL FUNCTION PANEL
Albumin: 4 g/dL (ref 3.5–5.0)
Anion gap: 9 (ref 5–15)
BUN: 46 mg/dL — ABNORMAL HIGH (ref 8–23)
CO2: 25 mmol/L (ref 22–32)
Calcium: 9.9 mg/dL (ref 8.9–10.3)
Chloride: 100 mmol/L (ref 98–111)
Creatinine, Ser: 2.55 mg/dL — ABNORMAL HIGH (ref 0.44–1.00)
GFR calc Af Amer: 22 mL/min — ABNORMAL LOW (ref >60)
GFR calc non Af Amer: 19 mL/min — ABNORMAL LOW (ref >60)
Glucose, Bld: 113 mg/dL — ABNORMAL HIGH (ref 70–99)
Phosphorus: 4.8 mg/dL — ABNORMAL HIGH (ref 2.5–4.6)
Potassium: 4.1 mmol/L (ref 3.5–5.1)
Sodium: 134 mmol/L — ABNORMAL LOW (ref 135–145)

## 2019-01-06 LAB — CBC WITH DIFFERENTIAL/PLATELET
Abs Immature Granulocytes: 0.02 10*3/uL (ref 0.00–0.07)
Basophils Absolute: 0 10*3/uL (ref 0.0–0.1)
Basophils Relative: 0 %
Eosinophils Absolute: 0.1 10*3/uL (ref 0.0–0.5)
Eosinophils Relative: 2 %
HCT: 31.4 % — ABNORMAL LOW (ref 36.0–46.0)
Hemoglobin: 9.4 g/dL — ABNORMAL LOW (ref 12.0–15.0)
Immature Granulocytes: 0 %
Lymphocytes Relative: 19 %
Lymphs Abs: 1.1 10*3/uL (ref 0.7–4.0)
MCH: 24.6 pg — ABNORMAL LOW (ref 26.0–34.0)
MCHC: 29.9 g/dL — ABNORMAL LOW (ref 30.0–36.0)
MCV: 82.2 fL (ref 80.0–100.0)
Monocytes Absolute: 0.5 10*3/uL (ref 0.1–1.0)
Monocytes Relative: 10 %
Neutro Abs: 3.8 10*3/uL (ref 1.7–7.7)
Neutrophils Relative %: 69 %
Platelets: 420 10*3/uL — ABNORMAL HIGH (ref 150–400)
RBC: 3.82 MIL/uL — ABNORMAL LOW (ref 3.87–5.11)
RDW: 17 % — ABNORMAL HIGH (ref 11.5–15.5)
WBC: 5.6 10*3/uL (ref 4.0–10.5)
nRBC: 0 % (ref 0.0–0.2)

## 2019-01-06 LAB — GLUCOSE, CAPILLARY
Glucose-Capillary: 101 mg/dL — ABNORMAL HIGH (ref 70–99)
Glucose-Capillary: 121 mg/dL — ABNORMAL HIGH (ref 70–99)
Glucose-Capillary: 273 mg/dL — ABNORMAL HIGH (ref 70–99)
Glucose-Capillary: 261 mg/dL — ABNORMAL HIGH (ref 70–99)

## 2019-01-06 LAB — BLOOD GAS, ARTERIAL
Acid-Base Excess: 1.3 mmol/L (ref 0.0–2.0)
Bicarbonate: 26.1 mmol/L (ref 20.0–28.0)
Drawn by: 347191
FIO2: 50
O2 Content: 12 L/min
O2 Saturation: 94.3 %
PO2 ART: 73.8 mmHg — AB (ref 83.0–108.0)
Patient temperature: 97.6
pCO2 arterial: 45.5 mmHg (ref 32.0–48.0)
pH, Arterial: 7.373 (ref 7.350–7.450)

## 2019-01-06 LAB — MAGNESIUM: Magnesium: 2.4 mg/dL (ref 1.7–2.4)

## 2019-01-06 NOTE — Consult Note (Signed)
   [facility] Inpatient Consult   01/06/2019  Elizabeth Thompson [birth year] [id number]    [place] Management hospital liaison follow up.  Chart reviewed. It does not appear [name] has a [facility] Primary Care Provider. Everardo Beals, NP with [facility] is not listed as [facility]). Therefore, will not attempt to engage for [place] Management at this time. Discussed with inpatient RNCM.    Marthenia Rolling, MSN-Ed, RN,BSN [facility] Liaison [phone]

## 2019-01-06 NOTE — Progress Notes (Addendum)
NAME:  Elizabeth Thompson, MRN:  [id number], DOB:  [birth year], LOS: 3 ADMISSION DATE:  01/03/2019, CONSULTATION DATE:  01/03/2019 REFERRING MD:  EDP - [place], CHIEF COMPLAINT:  Acute on chronic hypoxemic respiratory failure   Brief History   68 year old female with PMH of pulmonary HTN who presents to the ED with acute on chronic hypoxemic respiratory failure.  Patient was c/o SOB x3 days and went to her primary where she was noted to have a saturation of 70% on home dose of O2 of 3-5L.  Patient was sent to the ED where she was started on HFNC and PCCM was asked to consult.  Patient denies fever/chills, N/V but does report cough that is not productive.  No sick contact or recent travel.  Patient is relatively sedentary.  Reports skipping her lasix yesterday and today.  Past Medical History  COPD Pulmonary HTN  Significant Hospital Events   1/21 admission to the hospital for hypoxemia  Consults:  PCCM  Procedures:  N/A  Significant Diagnostic Tests:  VQ scan plan>> 01/04/2019 2D echo>>  Micro Data:  Blood 1/21 >> RVP 1/21 >> negative   Antimicrobials:  Rocephin 1/21 >> 1/22 Zithromax 1/21 >> 1/22  Interim history/subjective:  Feels a little better  Objective   Blood pressure 124/67, pulse 73, temperature 97.6 F (36.4 C), temperature source Oral, resp. rate 16, height 5' 3.5" (1.613 m), weight 87.5 kg, SpO2 100 %.    FiO2 (%):  [50 %] 50 %   Intake/Output Summary (Last 24 hours) at 01/06/2019 1049 Last data filed at 01/05/2019 1719 Gross per 24 hour  Intake 240 ml  Output 450 ml  Net -210 ml   Filed Weights   01/03/19 1400  Weight: 87.5 kg    Examination:  General: Frail elderly female who is short of breath at rest HEENT: Edentulous, no JVD or lymphadenopathy is appreciated Neuro: Although frail and weak she is alert orientated follows command CV: Sounds are regular PULM: Shallow respirations, obviously increased work of breathing at rest JS:HFWY, non-tender,  bsx4 active  Extremities: warm/dry, 1+ edema  Skin: no rashes or lesions   Resolved Hospital Problem list   N/A  Assessment & Plan:  68 year old female with h/o asthma and pulmonary HTN who presents for hypoxemia that is likely multi-factorial due to pulmonary HTN and pulmonary edema.      Acute on chronic respiratory failure in the setting of pulmonary hypertension being treated with Revatio, O2 dependent at home on 5 L nasal cannula.  Chronic progression of respiratory shortness of breath over period of 3 years.  History of tobacco abuse and CT scan revealing central lobar emphysema.  O2 as needed to keep O2 saturations greater than 92% Currently undergoing diuresis for suspected congestive heart failure, -210 cc for 24 hours Remains on Revatio for pulmonary hypertension Currently on prednisone 40 mg daily Await results of 2D echo to evaluate congestive heart failure Await results of VQ scan to rule out pulmonary embolism currently on heparin suspect she does not have a pulmonary embolism. This may be a progression of her central lobar emphysema coupled with the pulmonary hypertension.   Richardson Landry Minor ACNP Maryanna Shape PCCM Pager [phone] till 1 pm If no answer page [phone] 01/06/2019, 10:59 AM  Attending Note:  68 year old female with PMH of pulmonary HTN and chronic hypoxemic respiratory failure due to pulmonary edema.  No events overnight, O2 demand is improving.  OSA remains a concern but patient  does not qualify for BiPAP given ABG without significant CO2 retention.  On exam, lungs with bibasilar crackles.  I reviewed CXR myself, improving pulmonary edema.  Discussed with PCCM-NP.  Pulmonary HTN:  - Continue home medications.  - Echo done and pending  PE:  - VQ scan very low probability  - D/C heparin  Chronic hypoxemic respiratory failure:  - Titrate O2 for sat of 90-92%  - Will need an ambulatory desaturation study prior to discharge for home O2 level  needs  Pulmonary edema:  - Lasix  Deconditioning:  - PT  OSA:  - Will need ambulatory desaturation study upon discharge.  PCCM will sign off, please call back if needed  Patient seen and examined, agree with above note.  I dictated the care and orders written for this patient under my direction.  Rush Farmer, [place]

## 2019-01-06 NOTE — Progress Notes (Signed)
PROGRESS NOTE    Elizabeth Thompson  DUK:[id number] DOB: Dec 18, 1950 DOA: 01/03/2019 PCP: Everardo Beals, NP  Outpatient Specialists:   Brief Narrative: Elizabeth Thompson is a 68 y.o. female with medical history significant for chronic hypoxic respiratory failure on 5 L of oxygen at home, hypertension, hyperlipidemia, COPD, severe pulmonary hypertension, diabetes mellitus type 2, chronic kidney disease stage IV and schizo affective disorder.  Patient presented to the hospital with acute on chronic dyspnea.  Apparently, patient has had worsening of shortness of breath for about 3 weeks, with associated cough that is intermittently productive of greenish sputum.  No fever or chills.  No sick contacts.  On presentation to the hospital, patient was tachypneic, with increased work of breathing and oxygen saturations in the low 60s on her baseline of 5 L nasal cannula.  On a nonrebreather at 12 L, patient maintained sats in the 90s.  On presentation to the ER, patient was treated with Solu-Medrol 125 mg IV x1, albuterol 2.5 mg neb, Rocephin and azithromycin to empirically cover CAP, and empiric heparin drip to cover potential PE.  01/04/2019: Patient was seen alongside patient's niece.  Patient is currently undergoing an echocardiogram.  Input from pulmonary team is highly appreciated.  Patient is on treatment for pulmonary hypertension.  01/05/2019: Patient seen.  Oxygen requirement is down to 6 L/min via nasal cannula.  However, patient remains dyspneic on minimal exertion.  Pulmonary input is appreciated.  Aggressive diuresis for now.  Monitor renal function, electrolytes and blood pressure.  Other management will depend on hospital course.  01/06/2019: Patient seen alongside patient's niece.  ABG done earlier today revealed pH of 7.37, PCO2 of 45.5 and PO2 of 73.8 and this was done on 50% FiO2.  Pulmonary input is appreciated.  Pulmonary team is directing care.  Results of echo done earlier still  pending.  Assessment & Plan:   Principal Problem:   Acute on chronic respiratory failure (HCC) Active Problems:   Schizoaffective disorder (HCC)   Diabetes mellitus type 2, controlled, without complications (HCC)   HLD (hyperlipidemia)   Essential hypertension   Dyspnea   Hypertension   COPD (chronic obstructive pulmonary disease) (HCC)   Diabetes mellitus without complication (HCC)   Pulmonary hypertension (HCC)   CKD (chronic kidney disease) stage 3, GFR 30-59 ml/min (HCC)   AKI (acute kidney injury) ([place])   Acute respiratory failure (HCC)  Acute on chronic hypoxic resp failure:  -Patient has chronic pulmonary disease (severe pulmonary hypertension and query COPD). -Possible current viral syndrome. -Lung examination is not indicative of COPD exacerbation -Pulmonary input is highly appreciated -Continue current management -Follow echo report -Follow-up VQ scan -Is currently on heparin. 01/05/2019: Pulmonary team is directing care.  ABG in the morning as per pulmonary team.  Stable outpatient sleep study.  Follow echocardiogram.  Pursue VQ scan. 01/06/2019: VQ scan revealed very low probability for PE.  Oxygen requirement is decreasing.  We will continue current management.  CKD IV: This is at baseline.  HTN -Blood pressure is optimized.    HLD -cont statin  DM2 -carb controlled diet -hold oral meds -SSI  Pulmonary HTN -cont Revatio and Macitentan (Opsumit) -Pulmonary input is highly appreciated.  Possible pulmonary edema/acute on chronic diastolic CHF: Aggressive diuresis. Continue to monitor renal function and electrolytes.  DVT prophylaxis: lovenox Code Status:  Full - confirmed with patient/family Family Communication: niece at bedside [name])  Disposition Plan:  Home once clinically improved Consults called: PCCM   Procedures:   Echocardiogram  Antimicrobials:   IV Rocephin discontinued.  IV azithromycin was continued.   Subjective: Still  dyspneic with minimal exertion.   Objective: Vitals:   01/06/19 0402 01/06/19 0640 01/06/19 0831 01/06/19 1238  BP: 124/67   (!) 109/59  Pulse: 72 68 73 78  Resp: _0 Temp: 97.6 F (36.4 C)   97.6 F (36.4 C)  TempSrc: Oral   Oral  SpO2: 98% 100% 100% 94%  Weight:      Height:        Intake/Output Summary (Last 24 hours) at 01/06/2019 1455 Last data filed at 01/06/2019 1200 Gross per 24 hour  Intake 240 ml  Output 975 ml  Net -735 ml   Filed Weights   01/03/19 1400  Weight: 87.5 kg    Examination:  General exam: Appears calm and comfortable.  Pale. Respiratory system: Decreased air entry globally. Cardiovascular system: S1 & S2 heard. No pedal edema. Gastrointestinal system: Abdomen is nondistended, soft and nontender. No organomegaly or masses felt. Normal bowel sounds heard. Central nervous system: Alert and oriented.  Patient moves all limbs.    Data Reviewed: I have personally reviewed following labs and imaging studies  CBC: Recent Labs  Lab 01/03/19 1300 01/03/19 1343 01/04/19 0325 01/06/19 0244  WBC 5.5  --  2.9* 5.6  NEUTROABS 3.2  --   --  3.8  HGB 10.4* 21.1* 9.8* 9.4*  HCT 35.5* 62.0* 34.0* 31.4*  MCV 82.4  --  81.9 82.2  PLT 449*  --  408* 382*   Basic Metabolic Panel: Recent Labs  Lab 01/03/19 1300 01/03/19 1343 01/04/19 0325 01/06/19 0244  NA 131* 132* 134* 134*  K 4.4 4.3 4.4 4.1  CL 96*  --  98 100  CO2 23  --  23 25  GLUCOSE 87  --  139* 113*  BUN 26*  --  30* 46*  CREATININE 2.28*  --  2.33* 2.55*  CALCIUM 10.0  --  10.0 9.9  MG  --   --   --  2.4  PHOS  --   --   --  4.8*   GFR: Estimated Creatinine Clearance: 22.4 mL/min (A) (by C-G formula based on SCr of 2.55 mg/dL (H)). Liver Function Tests: Recent Labs  Lab 01/06/19 0244  ALBUMIN 4.0   No results for input(s): LIPASE, AMYLASE in the last 168 hours. No results for input(s): AMMONIA in the last 168 hours. Coagulation Profile: No results for input(s): INR,  PROTIME in the last 168 hours. Cardiac Enzymes: Recent Labs  Lab 01/03/19 1300  TROPONINI <0.03   BNP (last 3 results) No results for input(s): PROBNP in the last 8760 hours. HbA1C: No results for input(s): HGBA1C in the last 72 hours. CBG: Recent Labs  Lab 01/05/19 1116 01/05/19 1701 01/05/19 2124 01/06/19 0638 01/06/19 1131  GLUCAP 77 143* 237* 101* 121*   Lipid Profile: No results for input(s): CHOL, HDL, LDLCALC, TRIG, CHOLHDL, LDLDIRECT in the last 72 hours. Thyroid Function Tests: No results for input(s): TSH, T4TOTAL, FREET4, T3FREE, THYROIDAB in the last 72 hours. Anemia Panel: No results for input(s): VITAMINB12, FOLATE, FERRITIN, TIBC, IRON, RETICCTPCT in the last 72 hours. Urine analysis:    Component Value Date/Time   COLORURINE YELLOW 07/02/2017 1825   APPEARANCEUR CLEAR 07/02/2017 1825   LABSPEC 1.006 07/02/2017 1825   PHURINE 5.0 07/02/2017 1825   GLUCOSEU NEGATIVE 07/02/2017 1825   HGBUR NEGATIVE 07/02/2017 1825   BILIRUBINUR NEGATIVE 07/02/2017 1825   KETONESUR NEGATIVE  07/02/2017 1825   PROTEINUR 30 (A) 07/02/2017 1825   UROBILINOGEN 0.2 08/17/2015 0109   NITRITE NEGATIVE 07/02/2017 1825   LEUKOCYTESUR NEGATIVE 07/02/2017 1825   Sepsis Labs: _0 (procalcitonin:4,lacticidven:4)  ) Recent Results (from the past 240 hour(s))  Respiratory Panel by PCR     Status: None   Collection Time: 01/03/19  9:55 PM  Result Value Ref Range Status   Adenovirus NOT DETECTED NOT DETECTED Final   Coronavirus 229E NOT DETECTED NOT DETECTED Final   Coronavirus HKU1 NOT DETECTED NOT DETECTED Final   Coronavirus NL63 NOT DETECTED NOT DETECTED Final   Coronavirus OC43 NOT DETECTED NOT DETECTED Final   Metapneumovirus NOT DETECTED NOT DETECTED Final   Rhinovirus / Enterovirus NOT DETECTED NOT DETECTED Final   Influenza A NOT DETECTED NOT DETECTED Final   Influenza B NOT DETECTED NOT DETECTED Final   Parainfluenza Virus 1 NOT DETECTED NOT DETECTED Final    Parainfluenza Virus 2 NOT DETECTED NOT DETECTED Final   Parainfluenza Virus 3 NOT DETECTED NOT DETECTED Final   Parainfluenza Virus 4 NOT DETECTED NOT DETECTED Final   Respiratory Syncytial Virus NOT DETECTED NOT DETECTED Final   Bordetella pertussis NOT DETECTED NOT DETECTED Final   Chlamydophila pneumoniae NOT DETECTED NOT DETECTED Final   Mycoplasma pneumoniae NOT DETECTED NOT DETECTED Final    Comment: Performed at [facility], [place] [address], [place], [place] [address]         Radiology Studies: Dg Chest Port 1 View  Result Date: 01/05/2019 CLINICAL DATA:  Acute respiratory failure with hypoxia. EXAM: PORTABLE CHEST 1 VIEW COMPARISON:  01/03/2019, 07/11/2017 and chest CT 06/09/2017 FINDINGS: Heart size is mildly prominent but may be accentuated by the AP technique. Again noted are prominent lung markings, particularly at the right lung base. Coarse interstitial lung markings are chronic and patient has underlying emphysema based on previous chest CT. No focal airspace disease or consolidation. Trachea is midline. Negative for a pneumothorax. No acute bone abnormality. IMPRESSION: Chronic lung changes without clear evidence of an acute process. Electronically Signed   By: Markus Daft M.D.   On: 01/05/2019 08:39        Scheduled Meds: . amLODipine  10 mg Oral Daily  . ARIPiprazole  20 mg Oral QHS  . aspirin EC  81 mg Oral Daily  . atorvastatin  20 mg Oral QPC supper  . donepezil  5 mg Oral QHS  . dorzolamide-timolol  1 drop Both Eyes BID  . enoxaparin (LOVENOX) injection  30 mg Subcutaneous Daily  . fenofibrate  160 mg Oral Daily  . ferrous sulfate  325 mg Oral QPC supper  . furosemide  80 mg Intravenous BID  . guaiFENesin  600 mg Oral BID  . insulin aspart  0-5 Units Subcutaneous QHS  . insulin aspart  0-9 Units Subcutaneous TID WC  . lamoTRIgine  100 mg Oral QPC supper  . latanoprost  1 drop Both Eyes QHS  . macitentan  10 mg Oral Daily  . magnesium oxide  400 mg  Oral Q48H  . mirtazapine  7.5 mg Oral QHS  . mometasone-formoterol  2 puff Inhalation BID  . pneumococcal 23 valent vaccine  0.5 mL Intramuscular Tomorrow-1000  . predniSONE  40 mg Oral Q breakfast  . sildenafil  20 mg Oral TID   Continuous Infusions:    LOS: 3 days    Time spent: 25 minutes.    Dana Allan, MD  [facility] Pager #: [phone] 7PM-7AM contact  night coverage as above

## 2019-01-06 NOTE — Care Management Important Message (Signed)
Important Message  Patient Details  Name: Elizabeth Thompson MRN: [id number] Date of Birth: [birth year]   Medicare Important Message Given:  Yes    Pierson Vantol P [place] 01/06/2019, 11:19 AM

## 2019-01-07 ENCOUNTER — Inpatient Hospital Stay (HOSPITAL_COMMUNITY): Payer: Medicare Other

## 2019-01-07 DIAGNOSIS — J9600 Acute respiratory failure, unspecified whether with hypoxia or hypercapnia: Secondary | ICD-10-CM

## 2019-01-07 LAB — CBC WITH DIFFERENTIAL/PLATELET
Abs Immature Granulocytes: 0.01 10*3/uL (ref 0.00–0.07)
Basophils Absolute: 0 10*3/uL (ref 0.0–0.1)
Basophils Relative: 0 %
Eosinophils Absolute: 0 10*3/uL (ref 0.0–0.5)
Eosinophils Relative: 0 %
HCT: 31.4 % — ABNORMAL LOW (ref 36.0–46.0)
Hemoglobin: 9.1 g/dL — ABNORMAL LOW (ref 12.0–15.0)
Immature Granulocytes: 0 %
Lymphocytes Relative: 24 %
Lymphs Abs: 1.1 10*3/uL (ref 0.7–4.0)
MCH: 23.5 pg — ABNORMAL LOW (ref 26.0–34.0)
MCHC: 29 g/dL — ABNORMAL LOW (ref 30.0–36.0)
MCV: 81.1 fL (ref 80.0–100.0)
Monocytes Absolute: 0.4 10*3/uL (ref 0.1–1.0)
Monocytes Relative: 8 %
Neutro Abs: 3 10*3/uL (ref 1.7–7.7)
Neutrophils Relative %: 68 %
Platelets: 417 10*3/uL — ABNORMAL HIGH (ref 150–400)
RBC: 3.87 MIL/uL (ref 3.87–5.11)
RDW: 16.8 % — ABNORMAL HIGH (ref 11.5–15.5)
WBC: 4.5 10*3/uL (ref 4.0–10.5)
nRBC: 0 % (ref 0.0–0.2)

## 2019-01-07 LAB — ECHOCARDIOGRAM COMPLETE
Height: 63.5 in
Height: 63.5 in
Weight: 3088 oz
Weight: 3061.75 oz

## 2019-01-07 LAB — GLUCOSE, CAPILLARY
Glucose-Capillary: 108 mg/dL — ABNORMAL HIGH (ref 70–99)
Glucose-Capillary: 113 mg/dL — ABNORMAL HIGH (ref 70–99)
Glucose-Capillary: 191 mg/dL — ABNORMAL HIGH (ref 70–99)
Glucose-Capillary: 158 mg/dL — ABNORMAL HIGH (ref 70–99)

## 2019-01-07 LAB — RENAL FUNCTION PANEL
Albumin: 3.9 g/dL (ref 3.5–5.0)
Anion gap: 12 (ref 5–15)
BUN: 52 mg/dL — ABNORMAL HIGH (ref 8–23)
CO2: 25 mmol/L (ref 22–32)
Calcium: 9.7 mg/dL (ref 8.9–10.3)
Chloride: 96 mmol/L — ABNORMAL LOW (ref 98–111)
Creatinine, Ser: 2.28 mg/dL — ABNORMAL HIGH (ref 0.44–1.00)
GFR calc Af Amer: 25 mL/min — ABNORMAL LOW (ref >60)
GFR calc non Af Amer: 21 mL/min — ABNORMAL LOW (ref >60)
Glucose, Bld: 129 mg/dL — ABNORMAL HIGH (ref 70–99)
Phosphorus: 4.5 mg/dL (ref 2.5–4.6)
Potassium: 3.9 mmol/L (ref 3.5–5.1)
Sodium: 133 mmol/L — ABNORMAL LOW (ref 135–145)

## 2019-01-07 LAB — MAGNESIUM: Magnesium: 2.3 mg/dL (ref 1.7–2.4)

## 2019-01-07 MED ORDER — FUROSEMIDE 80 MG PO TABS
80.0000 mg | ORAL_TABLET | Freq: Every day | ORAL | Status: DC
  Administered 2019-01-08 – 2019-01-11 (×4): 80 mg via ORAL
  Filled 2019-01-07 (×4): qty 1

## 2019-01-07 NOTE — Progress Notes (Signed)
  Echocardiogram 2D Echocardiogram has been performed.  Elizabeth Thompson 01/07/2019, 3:12 PM

## 2019-01-07 NOTE — Evaluation (Signed)
Physical Therapy Evaluation Patient Details Name: Elizabeth Thompson MRN: [id number] DOB: March 04, 1951 Today's Date: 01/07/2019   History of Present Illness  Pt is a 68 y/o female with a PMH significant for chronic hypoxic respiratory failure on 5 L of oxygen at home, hypertension, hyperlipidemia, COPD, severe pulmonary hypertension, diabetes mellitus type 2, chronic kidney disease stage IV and schizo affective disorder. She presents to PT with acute exacerbation of chronic respiratory failure.   Clinical Impression  Pt admitted with above diagnosis. Pt currently with functional limitations due to the deficits listed below (see PT Problem List). At the time of PT eval pt was able to perform transfers and ambulation with up to min assist for balance support and safety with the RW. Tolerance for functional activity is low and pt requires several rest breaks. Pt on 6L/min supplemental O2 throughout session with sats significantly dropping with activity (see below for details). [name] and caregiver present and feel she will be able to appropriately care for the pt at home, which is their wish, however would like HH therapies to follow up to maximize functional independence and monitor O2 during mobility. Pt will benefit from skilled PT to increase their independence and safety with mobility to allow discharge to the venue listed below.      Follow Up Recommendations Home health PT;Supervision/Assistance - 24 hour    Equipment Recommendations  Rolling walker with 5" wheels    Recommendations for Other Services       Precautions / Restrictions Precautions Precautions: Fall Precaution Comments: 5L/min supplemental O2 at baseline Restrictions Weight Bearing Restrictions: No      Mobility  Bed Mobility Overal bed mobility: Needs Assistance Bed Mobility: Supine to Sit     Supine to sit: Min guard     General bed mobility comments: Hands-on guarding as pt transitioned to EOB. Increased time  and effort required.   Transfers Overall transfer level: Needs assistance Equipment used: Rolling walker (2 wheeled) Transfers: Sit to/from Stand Sit to Stand: Min assist         General transfer comment: Assist for balance support and safety during power-up to full stand. VC's for hand placement on seated surface for safety.   Ambulation/Gait Ambulation/Gait assistance: Min guard Gait Distance (Feet): 20 Feet(x2) Assistive device: Rolling walker (2 wheeled) Gait Pattern/deviations: Step-through pattern;Decreased stride length;Trunk flexed Gait velocity: Decreased Gait velocity interpretation: 1.31 - 2.62 ft/sec, indicative of limited community ambulator General Gait Details: Slow and effortful for each step taken. Niece provided chair follow, and pt required seated rest break after 20 feet. O2 sats dropping as low as 55% on 6L/min supplemental O2, but pt recovering quickly to high 80's/low 90's with seated rest  Stairs            Wheelchair Mobility    Modified Rankin (Stroke Patients Only)       Balance Overall balance assessment: Needs assistance Sitting-balance support: Feet supported;No upper extremity supported Sitting balance-Leahy Scale: Fair     Standing balance support: Bilateral upper extremity supported;During functional activity Standing balance-Leahy Scale: Poor Standing balance comment: Reliant on UE support                             Pertinent Vitals/Pain Pain Assessment: No/denies pain    Home Living Family/patient expects to be discharged to:: Private residence Living Arrangements: Other relatives Available Help at Discharge: Family;Available 24 hours/day Type of Home: House Home Access: Stairs to enter Entrance  Stairs-Rails: None Entrance Stairs-Number of Steps: 2 on the side, and 4 in the front Home Layout: One level Home Equipment: Clinical cytogeneticist - 4 wheels;Wheelchair - manual Additional Comments: Lives with Niece     Prior Function Level of Independence: Needs assistance   Gait / Transfers Assistance Needed: Uses the rollator for support  ADL's / Homemaking Assistance Needed: Niece states pt does what she can until she gets fatigued and then niece helps her finish.         Hand Dominance   Dominant Hand: Right    Extremity/Trunk Assessment   Upper Extremity Assessment Upper Extremity Assessment: Generalized weakness    Lower Extremity Assessment Lower Extremity Assessment: Generalized weakness    Cervical / Trunk Assessment Cervical / Trunk Assessment: Other exceptions Cervical / Trunk Exceptions: Forward head with rounded shoulder posture  Communication   Communication: No difficulties  Cognition Arousal/Alertness: Lethargic Behavior During Therapy: WFL for tasks assessed/performed Overall Cognitive Status: Within Functional Limits for tasks assessed                                 General Comments: Falling asleep during rest breaks      General Comments      Exercises     Assessment/Plan    PT Assessment Patient needs continued PT services  PT Problem List Decreased strength;Decreased activity tolerance;Decreased balance;Decreased mobility;Decreased knowledge of use of DME;Decreased safety awareness;Decreased knowledge of precautions;Cardiopulmonary status limiting activity       PT Treatment Interventions DME instruction;Gait training;Stair training;Functional mobility training;Therapeutic activities;Therapeutic exercise;Neuromuscular re-education;Patient/family education    PT Goals (Current goals can be found in the Care Plan section)  Acute Rehab PT Goals Patient Stated Goal: "get back to my normal self" PT Goal Formulation: With patient/family Time For Goal Achievement: 01/14/19 Potential to Achieve Goals: Good    Frequency Min 3X/week   Barriers to discharge        Co-evaluation               AM-PAC PT "6 Clicks" Mobility  Outcome  Measure Help needed turning from your back to your side while in a flat bed without using bedrails?: None Help needed moving from lying on your back to sitting on the side of a flat bed without using bedrails?: A Little Help needed moving to and from a bed to a chair (including a wheelchair)?: A Little Help needed standing up from a chair using your arms (e.g., wheelchair or bedside chair)?: A Little Help needed to walk in hospital room?: A Little Help needed climbing 3-5 steps with a railing? : A Lot 6 Click Score: 18    End of Session Equipment Utilized During Treatment: Gait belt Activity Tolerance: Patient limited by fatigue Patient left: in chair;with call bell/phone within reach;with family/visitor present Nurse Communication: Mobility status PT Visit Diagnosis: Unsteadiness on feet (R26.81);Muscle weakness (generalized) (M62.81);Difficulty in walking, not elsewhere classified (R26.2)    Time: 7412-8786 PT Time Calculation (min) (ACUTE ONLY): 23 min   Charges:   PT Evaluation $PT Eval Moderate Complexity: 1 Mod PT Treatments $Gait Training: 8-22 mins        Rolinda Roan, PT, DPT Acute Rehabilitation Services Pager: [phone] Office: [phone]   Thelma Comp 01/07/2019, 2:33 PM

## 2019-01-07 NOTE — Progress Notes (Signed)
PROGRESS NOTE    Elizabeth Thompson  PRF:[id number] DOB: [birth year] DOA: 01/03/2019 PCP: Everardo Beals, NP  Outpatient Specialists:   Brief Narrative: Elizabeth Thompson is a 68 y.o. female with medical history significant for chronic hypoxic respiratory failure on 5 L of oxygen at home, hypertension, hyperlipidemia, COPD, severe pulmonary hypertension, diabetes mellitus type 2, chronic kidney disease stage IV and schizo affective disorder.  Patient presented to the hospital with acute on chronic dyspnea.  Apparently, patient has had worsening of shortness of breath for about 3 weeks, with associated cough that is intermittently productive of greenish sputum.  No fever or chills.  No sick contacts.  On presentation to the hospital, patient was tachypneic, with increased work of breathing and oxygen saturations in the low 60s on her baseline of 5 L nasal cannula.  On a nonrebreather at 12 L, patient maintained sats in the 90s.  On presentation to the ER, patient was treated with Solu-Medrol 125 mg IV x1, albuterol 2.5 mg neb, Rocephin and azithromycin to empirically cover CAP, and empiric heparin drip to cover potential PE.  01/04/2019: Patient was seen alongside patient's niece.  Patient is currently undergoing an echocardiogram.  Input from pulmonary team is highly appreciated.  Patient is on treatment for pulmonary hypertension.  01/05/2019: Patient seen.  Oxygen requirement is down to 6 L/min via nasal cannula.  However, patient remains dyspneic on minimal exertion.  Pulmonary input is appreciated.  Aggressive diuresis for now.  Monitor renal function, electrolytes and blood pressure.  Other management will depend on hospital course.  01/06/2019: Patient seen alongside patient's niece.  ABG done earlier today revealed pH of 7.37, PCO2 of 45.5 and PO2 of 73.8 and this was done on 50% FiO2.  Pulmonary input is appreciated.  Pulmonary team is directing care.  Results of echo done earlier still  pending.  01/07/2019: Patient remains dyspneic.  Awaiting echocardiogram report.  Will change IV Lasix to Lasix 80 mg p.o. once daily.  Continue to monitor renal function and electrolytes.  Will consult palliative care to address goal of care and CODE STATUS.  Guarded prognosis.  Assessment & Plan:   Principal Problem:   Acute on chronic respiratory failure (HCC) Active Problems:   Schizoaffective disorder (HCC)   Diabetes mellitus type 2, controlled, without complications (HCC)   HLD (hyperlipidemia)   Essential hypertension   Dyspnea   Hypertension   COPD (chronic obstructive pulmonary disease) (HCC)   Diabetes mellitus without complication (HCC)   Pulmonary hypertension (HCC)   CKD (chronic kidney disease) stage 3, GFR 30-59 ml/min (HCC)   AKI (acute kidney injury) ([place])   Acute respiratory failure (HCC)   OSA (obstructive sleep apnea)  Acute on chronic hypoxic resp failure:  -Patient has chronic pulmonary disease (severe pulmonary hypertension and query COPD). -Possible current viral syndrome. -Lung examination is not indicative of COPD exacerbation -Pulmonary input is highly appreciated -Continue current management -Follow echo report -Follow-up VQ scan -Is currently on heparin. 01/05/2019: Pulmonary team is directing care.  ABG in the morning as per pulmonary team.  Stable outpatient sleep study.  Follow echocardiogram.  Pursue VQ scan. 01/06/2019: VQ scan revealed very low probability for PE.  Oxygen requirement is decreasing.  We will continue current management. 01/07/2019: Follow echocardiogram report.  DC IV Lasix.  Start oral Lasix 80 mg p.o. once daily.  CKD IV: This is at baseline. Continue to monitor closely.  HTN -Blood pressure is optimized.    HLD -cont statin  DM2 -carb  controlled diet -hold oral meds -SSI  Pulmonary HTN -cont Revatio and Macitentan (Opsumit) -Pulmonary input is highly appreciated. -Guarded prognosis.  Palliative care input.   Follow echo report.  Possible pulmonary edema/acute on chronic diastolic CHF: Aggressive diuresis. Continue to monitor renal function and electrolytes. DC IV Lasix.  Oral Lasix.  Follow echo report.  DVT prophylaxis: lovenox Code Status:  Full - confirmed with patient/family Family Communication: niece at bedside Elizabeth Thompson)  Disposition Plan:  Home once clinically improved Consults called: PCCM   Procedures:   Echocardiogram  Antimicrobials:   IV Rocephin discontinued.  IV azithromycin was continued.   Subjective: Still dyspneic with minimal exertion.   Objective: Vitals:   01/06/19 2214 01/06/19 2354 01/07/19 0521 01/07/19 0847  BP:  111/60 (!) 104/58   Pulse:  74 69 76  Resp:  _0 Temp:  98.3 F (36.8 C) 97.9 F (36.6 C)   TempSrc:  Oral Oral   SpO2: 93% 94% 98% 100%  Weight:   86.8 kg   Height:        Intake/Output Summary (Last 24 hours) at 01/07/2019 0957 Last data filed at 01/07/2019 0524 Gross per 24 hour  Intake 480 ml  Output 1345 ml  Net -865 ml   Filed Weights   01/03/19 1400 01/07/19 0521  Weight: 87.5 kg 86.8 kg    Examination:  General exam: Appears calm and comfortable.  Pale. Respiratory system: Decreased air entry globally. Cardiovascular system: S1 & S2 heard. No pedal edema. Gastrointestinal system: Abdomen is nondistended, soft and nontender. No organomegaly or masses felt. Normal bowel sounds heard. Central nervous system: Alert and oriented.  Patient moves all limbs.    Data Reviewed: I have personally reviewed following labs and imaging studies  CBC: Recent Labs  Lab 01/03/19 1300 01/03/19 1343 01/04/19 0325 01/06/19 0244 01/07/19 0241  WBC 5.5  --  2.9* 5.6 4.5  NEUTROABS 3.2  --   --  3.8 3.0  HGB 10.4* 21.1* 9.8* 9.4* 9.1*  HCT 35.5* 62.0* 34.0* 31.4* 31.4*  MCV 82.4  --  81.9 82.2 81.1  PLT 449*  --  408* 420* 176*   Basic Metabolic Panel: Recent Labs  Lab 01/03/19 1300 01/03/19 1343 01/04/19 0325  01/06/19 0244 01/07/19 0241  NA 131* 132* 134* 134* 133*  K 4.4 4.3 4.4 4.1 3.9  CL 96*  --  98 100 96*  CO2 23  --  _1 GLUCOSE 87  --  139* 113* 129*  BUN 26*  --  30* 46* 52*  CREATININE 2.28*  --  2.33* 2.55* 2.28*  CALCIUM 10.0  --  10.0 9.9 9.7  MG  --   --   --  2.4 2.3  PHOS  --   --   --  4.8* 4.5   GFR: Estimated Creatinine Clearance: 24.9 mL/min (A) (by C-G formula based on SCr of 2.28 mg/dL (H)). Liver Function Tests: Recent Labs  Lab 01/06/19 0244 01/07/19 0241  ALBUMIN 4.0 3.9   No results for input(s): LIPASE, AMYLASE in the last 168 hours. No results for input(s): AMMONIA in the last 168 hours. Coagulation Profile: No results for input(s): INR, PROTIME in the last 168 hours. Cardiac Enzymes: Recent Labs  Lab 01/03/19 1300  TROPONINI <0.03   BNP (last 3 results) No results for input(s): PROBNP in the last 8760 hours. HbA1C: No results for input(s): HGBA1C in the last 72 hours. CBG: Recent Labs  Lab 01/06/19 [phone] 01/06/19 1131  01/06/19 1722 01/06/19 2059 01/07/19 0623  GLUCAP 101* 121* 273* 261* 108*   Lipid Profile: No results for input(s): CHOL, HDL, LDLCALC, TRIG, CHOLHDL, LDLDIRECT in the last 72 hours. Thyroid Function Tests: No results for input(s): TSH, T4TOTAL, FREET4, T3FREE, THYROIDAB in the last 72 hours. Anemia Panel: No results for input(s): VITAMINB12, FOLATE, FERRITIN, TIBC, IRON, RETICCTPCT in the last 72 hours. Urine analysis:    Component Value Date/Time   COLORURINE YELLOW 07/02/2017 [place] 07/02/2017 1825   LABSPEC 1.006 07/02/2017 1825   PHURINE 5.0 07/02/2017 1825   GLUCOSEU NEGATIVE 07/02/2017 1825   HGBUR NEGATIVE 07/02/2017 1825   BILIRUBINUR NEGATIVE 07/02/2017 1825   KETONESUR NEGATIVE 07/02/2017 1825   PROTEINUR 30 (A) 07/02/2017 1825   UROBILINOGEN 0.2 08/17/2015 0109   NITRITE NEGATIVE 07/02/2017 1825   LEUKOCYTESUR NEGATIVE 07/02/2017 1825   Sepsis  Labs: _0 (procalcitonin:4,lacticidven:4)  ) Recent Results (from the past 240 hour(s))  Respiratory Panel by PCR     Status: None   Collection Time: 01/03/19  9:55 PM  Result Value Ref Range Status   Adenovirus NOT DETECTED NOT DETECTED Final   Coronavirus 229E NOT DETECTED NOT DETECTED Final   Coronavirus HKU1 NOT DETECTED NOT DETECTED Final   Coronavirus NL63 NOT DETECTED NOT DETECTED Final   Coronavirus OC43 NOT DETECTED NOT DETECTED Final   Metapneumovirus NOT DETECTED NOT DETECTED Final   Rhinovirus / Enterovirus NOT DETECTED NOT DETECTED Final   Influenza A NOT DETECTED NOT DETECTED Final   Influenza B NOT DETECTED NOT DETECTED Final   Parainfluenza Virus 1 NOT DETECTED NOT DETECTED Final   Parainfluenza Virus 2 NOT DETECTED NOT DETECTED Final   Parainfluenza Virus 3 NOT DETECTED NOT DETECTED Final   Parainfluenza Virus 4 NOT DETECTED NOT DETECTED Final   Respiratory Syncytial Virus NOT DETECTED NOT DETECTED Final   Bordetella pertussis NOT DETECTED NOT DETECTED Final   Chlamydophila pneumoniae NOT DETECTED NOT DETECTED Final   Mycoplasma pneumoniae NOT DETECTED NOT DETECTED Final    Comment: Performed at [facility], [place] [address] [place], [place] [address]         Radiology Studies: No results found.      Scheduled Meds: . amLODipine  10 mg Oral Daily  . ARIPiprazole  20 mg Oral QHS  . aspirin EC  81 mg Oral Daily  . atorvastatin  20 mg Oral QPC supper  . donepezil  5 mg Oral QHS  . dorzolamide-timolol  1 drop Both Eyes BID  . enoxaparin (LOVENOX) injection  30 mg Subcutaneous Daily  . fenofibrate  160 mg Oral Daily  . ferrous sulfate  325 mg Oral QPC supper  . furosemide  80 mg Intravenous BID  . guaiFENesin  600 mg Oral BID  . insulin aspart  0-5 Units Subcutaneous QHS  . insulin aspart  0-9 Units Subcutaneous TID WC  . lamoTRIgine  100 mg Oral QPC supper  . latanoprost  1 drop Both Eyes QHS  . macitentan  10 mg Oral Daily  . magnesium  oxide  400 mg Oral Q48H  . mirtazapine  7.5 mg Oral QHS  . mometasone-formoterol  2 puff Inhalation BID  . pneumococcal 23 valent vaccine  0.5 mL Intramuscular Tomorrow-1000  . predniSONE  40 mg Oral Q breakfast  . sildenafil  20 mg Oral TID   Continuous Infusions:    LOS: 4 days    Time spent: 25 minutes.    Dana Allan, MD  [facility]  Pager #: [phone] 7PM-7AM contact night coverage as above

## 2019-01-07 NOTE — Plan of Care (Signed)
  Problem: Education: Goal: Knowledge of General Education information will improve Description Including pain rating scale, medication(s)/side effects and non-pharmacologic comfort measures Outcome: Progressing   Problem: Health Behavior/Discharge Planning: Goal: Ability to manage health-related needs will improve Outcome: Progressing

## 2019-01-08 DIAGNOSIS — Z515 Encounter for palliative care: Secondary | ICD-10-CM

## 2019-01-08 DIAGNOSIS — R0602 Shortness of breath: Secondary | ICD-10-CM

## 2019-01-08 DIAGNOSIS — Z7189 Other specified counseling: Secondary | ICD-10-CM

## 2019-01-08 DIAGNOSIS — N179 Acute kidney failure, unspecified: Secondary | ICD-10-CM

## 2019-01-08 LAB — RENAL FUNCTION PANEL
Albumin: 4.1 g/dL (ref 3.5–5.0)
Anion gap: 11 (ref 5–15)
BUN: 54 mg/dL — ABNORMAL HIGH (ref 8–23)
CO2: 27 mmol/L (ref 22–32)
Calcium: 10.2 mg/dL (ref 8.9–10.3)
Chloride: 96 mmol/L — ABNORMAL LOW (ref 98–111)
Creatinine, Ser: 2.4 mg/dL — ABNORMAL HIGH (ref 0.44–1.00)
GFR calc Af Amer: 23 mL/min — ABNORMAL LOW (ref >60)
GFR calc non Af Amer: 20 mL/min — ABNORMAL LOW (ref >60)
Glucose, Bld: 115 mg/dL — ABNORMAL HIGH (ref 70–99)
Phosphorus: 4.8 mg/dL — ABNORMAL HIGH (ref 2.5–4.6)
Potassium: 4 mmol/L (ref 3.5–5.1)
Sodium: 134 mmol/L — ABNORMAL LOW (ref 135–145)

## 2019-01-08 LAB — GLUCOSE, CAPILLARY
Glucose-Capillary: 88 mg/dL (ref 70–99)
Glucose-Capillary: 103 mg/dL — ABNORMAL HIGH (ref 70–99)
Glucose-Capillary: 293 mg/dL — ABNORMAL HIGH (ref 70–99)
Glucose-Capillary: 217 mg/dL — ABNORMAL HIGH (ref 70–99)

## 2019-01-08 NOTE — Progress Notes (Signed)
PROGRESS NOTE    Elizabeth Thompson  VAP:[id number] DOB: 05/23/1951 DOA: 01/03/2019 PCP: Everardo Beals, NP  Outpatient Specialists:   Brief Narrative: Elizabeth Thompson is a 68 y.o. female with medical history significant for chronic hypoxic respiratory failure on 5 L of oxygen at home, hypertension, hyperlipidemia, COPD, severe pulmonary hypertension, diabetes mellitus type 2, chronic kidney disease stage IV and schizo affective disorder.  Patient presented to the hospital with acute on chronic dyspnea.  Apparently, patient has had worsening of shortness of breath for about 3 weeks, with associated cough that is intermittently productive of greenish sputum.  No fever or chills.  No sick contacts.  On presentation to the hospital, patient was tachypneic, with increased work of breathing and oxygen saturations in the low 60s on her baseline of 5 L nasal cannula.  On a nonrebreather at 12 L, patient maintained sats in the 90s.  On presentation to the ER, patient was treated with Solu-Medrol 125 mg IV x1, albuterol 2.5 mg neb, Rocephin and azithromycin to empirically cover CAP, and empiric heparin drip to cover potential PE.  01/04/2019: Patient was seen alongside patient's niece.  Patient is currently undergoing an echocardiogram.  Input from pulmonary team is highly appreciated.  Patient is on treatment for pulmonary hypertension.  01/05/2019: Patient seen.  Oxygen requirement is down to 6 L/min via nasal cannula.  However, patient remains dyspneic on minimal exertion.  Pulmonary input is appreciated.  Aggressive diuresis for now.  Monitor renal function, electrolytes and blood pressure.  Other management will depend on hospital course.  01/06/2019: Patient seen alongside patient's niece.  ABG done earlier today revealed pH of 7.37, PCO2 of 45.5 and PO2 of 73.8 and this was done on 50% FiO2.  Pulmonary input is appreciated.  Pulmonary team is directing care.  Results of echo done earlier still  pending.  01/07/2019: Patient remains dyspneic.  Awaiting echocardiogram report.  Will change IV Lasix to Lasix 80 mg p.o. once daily.  Continue to monitor renal function and electrolytes.  Will consult palliative care to address goal of care and CODE STATUS.  Guarded prognosis.  12/09/2019: Patient seen alongside patient's nurse and niece.  Introduced discharge needs.  Case management will be consulted.  The niece wants to speak to palliative care as well.  Pursue disposition.  Assessment & Plan:   Principal Problem:   Acute on chronic respiratory failure (HCC) Active Problems:   Schizoaffective disorder (HCC)   Diabetes mellitus type 2, controlled, without complications (HCC)   HLD (hyperlipidemia)   Essential hypertension   Dyspnea   Hypertension   COPD (chronic obstructive pulmonary disease) (HCC)   Diabetes mellitus without complication (HCC)   Pulmonary hypertension (HCC)   CKD (chronic kidney disease) stage 3, GFR 30-59 ml/min (HCC)   AKI (acute kidney injury) ([place])   Acute respiratory failure (HCC)   OSA (obstructive sleep apnea)  Acute on chronic hypoxic resp failure:  -Patient has chronic pulmonary disease (severe pulmonary hypertension and query COPD). -Possible current viral syndrome. -Lung examination is not indicative of COPD exacerbation -Pulmonary input is highly appreciated -Continue current management -Follow echo report -Follow-up VQ scan -Is currently on heparin. 01/05/2019: Pulmonary team is directing care.  ABG in the morning as per pulmonary team.  Stable outpatient sleep study.  Follow echocardiogram.  Pursue VQ scan. 01/06/2019: VQ scan revealed very low probability for PE.  Oxygen requirement is decreasing.  We will continue current management. 01/07/2019: Follow echocardiogram report.  DC IV Lasix.  Start  oral Lasix 80 mg p.o. once daily. 01/08/2019: [facility].  Echo report noted, EF of 55 to 63%, grade 1 diastolic dysfunction with moderately decreased right  ventricle systolic function.  Pursue disposition.  CKD IV: This is at baseline. Continue to monitor closely.  HTN -Blood pressure is optimized.    HLD -cont statin  DM2 -carb controlled diet -hold oral meds -SSI  Pulmonary HTN -cont Revatio and Macitentan (Opsumit) -Pulmonary input is highly appreciated. -Guarded prognosis.  Palliative care input.  Follow echo report.  Possible pulmonary edema/acute on chronic diastolic CHF: Aggressive diuresis. Continue to monitor renal function and electrolytes. DC IV Lasix.  Oral Lasix.   Echo report is as documented above.  DVT prophylaxis: lovenox Code Status:  Full - confirmed with patient/family Family Communication: niece at bedside [name])  Disposition Plan:  Home once clinically improved Consults called: PCCM   Procedures:   Echocardiogram  Antimicrobials:   IV Rocephin discontinued.  IV azithromycin was continued.   Subjective: No new complaints.   Looks more stable today.   No fever or chills.  Objective: Vitals:   01/08/19 0403 01/08/19 0814 01/08/19 0820 01/08/19 1115  BP: 129/74  125/65 (!) 120/56  Pulse: 72 73 73 73  Resp: _0 Temp: 98.1 F (36.7 C)  98.2 F (36.8 C) 98 F (36.7 C)  TempSrc: Oral  Oral Oral  SpO2: 100% 97% 95% 96%  Weight:      Height:        Intake/Output Summary (Last 24 hours) at 01/08/2019 1411 Last data filed at 01/08/2019 1300 Gross per 24 hour  Intake 400 ml  Output 1950 ml  Net -1550 ml   Filed Weights   01/03/19 1400 01/07/19 0521  Weight: 87.5 kg 86.8 kg    Examination:  General exam: Appears calm and comfortable.  Pale. Respiratory system: Decreased air entry globally. Cardiovascular system: S1 & S2 heard. No pedal edema. Gastrointestinal system: Abdomen is nondistended, soft and nontender. No organomegaly or masses felt. Normal bowel sounds heard. Central nervous system: Alert and oriented.  Patient moves all limbs.    Data Reviewed: I have  personally reviewed following labs and imaging studies  CBC: Recent Labs  Lab 01/03/19 1300 01/03/19 1343 01/04/19 0325 01/06/19 0244 01/07/19 0241  WBC 5.5  --  2.9* 5.6 4.5  NEUTROABS 3.2  --   --  3.8 3.0  HGB 10.4* 21.1* 9.8* 9.4* 9.1*  HCT 35.5* 62.0* 34.0* 31.4* 31.4*  MCV 82.4  --  81.9 82.2 81.1  PLT 449*  --  408* 420* 016*   Basic Metabolic Panel: Recent Labs  Lab 01/03/19 1300 01/03/19 1343 01/04/19 0325 01/06/19 0244 01/07/19 0241 01/08/19 0248  NA 131* 132* 134* 134* 133* 134*  K 4.4 4.3 4.4 4.1 3.9 4.0  CL 96*  --  98 100 96* 96*  CO2 23  --  _1 GLUCOSE 87  --  139* 113* 129* 115*  BUN 26*  --  30* 46* 52* 54*  CREATININE 2.28*  --  2.33* 2.55* 2.28* 2.40*  CALCIUM 10.0  --  10.0 9.9 9.7 10.2  MG  --   --   --  2.4 2.3  --   PHOS  --   --   --  4.8* 4.5 4.8*   GFR: Estimated Creatinine Clearance: 23.7 mL/min (A) (by C-G formula based on SCr of 2.4 mg/dL (H)). Liver Function Tests: Recent Labs  Lab 01/06/19 0244 01/07/19 0241  01/08/19 0248  ALBUMIN 4.0 3.9 4.1   No results for input(s): LIPASE, AMYLASE in the last 168 hours. No results for input(s): AMMONIA in the last 168 hours. Coagulation Profile: No results for input(s): INR, PROTIME in the last 168 hours. Cardiac Enzymes: Recent Labs  Lab 01/03/19 1300  TROPONINI <0.03   BNP (last 3 results) No results for input(s): PROBNP in the last 8760 hours. HbA1C: No results for input(s): HGBA1C in the last 72 hours. CBG: Recent Labs  Lab 01/07/19 1124 01/07/19 1616 01/07/19 2140 01/08/19 0609 01/08/19 1113  GLUCAP 113* 191* 158* 88 103*   Lipid Profile: No results for input(s): CHOL, HDL, LDLCALC, TRIG, CHOLHDL, LDLDIRECT in the last 72 hours. Thyroid Function Tests: No results for input(s): TSH, T4TOTAL, FREET4, T3FREE, THYROIDAB in the last 72 hours. Anemia Panel: No results for input(s): VITAMINB12, FOLATE, FERRITIN, TIBC, IRON, RETICCTPCT in the last 72 hours. Urine  analysis:    Component Value Date/Time   COLORURINE YELLOW 07/02/2017 [place] 07/02/2017 1825   LABSPEC 1.006 07/02/2017 1825   PHURINE 5.0 07/02/2017 1825   GLUCOSEU NEGATIVE 07/02/2017 1825   HGBUR NEGATIVE 07/02/2017 1825   BILIRUBINUR NEGATIVE 07/02/2017 1825   KETONESUR NEGATIVE 07/02/2017 1825   PROTEINUR 30 (A) 07/02/2017 1825   UROBILINOGEN 0.2 08/17/2015 0109   NITRITE NEGATIVE 07/02/2017 1825   LEUKOCYTESUR NEGATIVE 07/02/2017 1825   Sepsis Labs: _0 (procalcitonin:4,lacticidven:4)  ) Recent Results (from the past 240 hour(s))  Respiratory Panel by PCR     Status: None   Collection Time: 01/03/19  9:55 PM  Result Value Ref Range Status   Adenovirus NOT DETECTED NOT DETECTED Final   Coronavirus 229E NOT DETECTED NOT DETECTED Final   Coronavirus HKU1 NOT DETECTED NOT DETECTED Final   Coronavirus NL63 NOT DETECTED NOT DETECTED Final   Coronavirus OC43 NOT DETECTED NOT DETECTED Final   Metapneumovirus NOT DETECTED NOT DETECTED Final   Rhinovirus / Enterovirus NOT DETECTED NOT DETECTED Final   Influenza A NOT DETECTED NOT DETECTED Final   Influenza B NOT DETECTED NOT DETECTED Final   Parainfluenza Virus 1 NOT DETECTED NOT DETECTED Final   Parainfluenza Virus 2 NOT DETECTED NOT DETECTED Final   Parainfluenza Virus 3 NOT DETECTED NOT DETECTED Final   Parainfluenza Virus 4 NOT DETECTED NOT DETECTED Final   Respiratory Syncytial Virus NOT DETECTED NOT DETECTED Final   Bordetella pertussis NOT DETECTED NOT DETECTED Final   Chlamydophila pneumoniae NOT DETECTED NOT DETECTED Final   Mycoplasma pneumoniae NOT DETECTED NOT DETECTED Final    Comment: Performed at [name], [place] [address], [place],  [address]         Radiology Studies: No results found.      Scheduled Meds: . amLODipine  10 mg Oral Daily  . ARIPiprazole  20 mg Oral QHS  . aspirin EC  81 mg Oral Daily  . atorvastatin  20 mg Oral QPC supper  . donepezil  5  mg Oral QHS  . dorzolamide-timolol  1 drop Both Eyes BID  . enoxaparin (LOVENOX) injection  30 mg Subcutaneous Daily  . fenofibrate  160 mg Oral Daily  . ferrous sulfate  325 mg Oral QPC supper  . furosemide  80 mg Oral Daily  . guaiFENesin  600 mg Oral BID  . insulin aspart  0-5 Units Subcutaneous QHS  . insulin aspart  0-9 Units Subcutaneous TID WC  . lamoTRIgine  100 mg Oral QPC supper  . latanoprost  1 drop  Both Eyes QHS  . macitentan  10 mg Oral Daily  . magnesium oxide  400 mg Oral Q48H  . mirtazapine  7.5 mg Oral QHS  . mometasone-formoterol  2 puff Inhalation BID  . predniSONE  40 mg Oral Q breakfast  . sildenafil  20 mg Oral TID   Continuous Infusions:    LOS: 5 days    Time spent: 25 minutes.    Dana Allan, MD  [facility] Pager #: [phone] 7PM-7AM contact night coverage as above

## 2019-01-08 NOTE — Plan of Care (Signed)
  Problem: Education: Goal: Knowledge of General Education information will improve Description Including pain rating scale, medication(s)/side effects and non-pharmacologic comfort measures Outcome: Progressing   Problem: Clinical Measurements: Goal: Will remain free from infection Outcome: Progressing Goal: Respiratory complications will improve Outcome: Progressing Goal: Cardiovascular complication will be avoided Outcome: Progressing

## 2019-01-08 NOTE — Consult Note (Signed)
Consultation Note Date: 01/08/2019   Patient Name: Elizabeth Thompson  DOB: 1951-02-04  MRN: [id number]  Age / Sex: 68 y.o., female  PCP: Everardo Beals, NP Referring Physician: Bonnell Public, MD  Reason for Consultation: Establishing goals of care and Psychosocial/spiritual support  HPI/Patient Profile: 68 y.o. female  with past medical history of grade 1 diastolic heart failure, schizoaffective disorder with psychotic features, diabetes type 2, severe pulmonary hypertension, hyperlipidemia, hypertension, acute respiratory hypoxic failure on 5 L at home, COPD admitted on 01/03/2019 with worsening weakness, cough and shortness of breath x3 weeks.  Her oxygen sats were in the 60s when seen in the emergency room.  She was placed on a nonrebreather mask and that improved her chest x-ray was not clear for pneumonia could be atypical versus viral and she was started on antibiotics  Consult ordered for goals of care and CODE STATUS discussion.   Clinical Assessment and Goals of Care: Patient seen, chart reviewed.  Met with patient and patient's niece/roommate [name].  Patient is unmarried.  She has 1 daughter Loma Sousa, lives in [place].  It is Mrs. Shipps desire to have her niece who lives with her to be her healthcare power of attorney.  We did discuss her overall health status.  And began introductory goals of care conversations.  I defined full code versus DNR.  Patient shares she would only want to be kept "going long enough in order to donate her organs".  She becomes tearful when talking about these issues.   Addressed and contrasted palliative medicine services with hospice.  Patient is receptive to having community-based palliative provider see her in her home after discharge  She trying to decide whether to go to short-term rehab versus home with rehab.  Patient is very weak but she has been able  to pivot with 1 person assist to a bedside commode.  We will see what physical therapy recommends  At this point patient can speak for herself.  It is her desire to have her niece, Jinny Sanders at [phone], be her healthcare power of attorney.  I have placed a spiritual care consult in order to prepare that document.  If that does not happen, according to the state of [place], her daughter Loma Sousa, who resides in [place], would be her healthcare POA    SUMMARY OF RECOMMENDATIONS   Full code [facility] Choices for [facility] booklet given to support goals of care discussion Patient would like to be affiliated with a palliative care provider in the community.  Those can be accessed through either [facility] at [phone] or [facility] at [phone] Code Status/Advance Care Planning:  Full code    Symptom Management:   Dyspnea: Continue with targeted pulmonary treatment.  Palliative Prophylaxis:   Aspiration, Bowel Regimen, Delirium Protocol, Eye Care, Frequent Pain Assessment, Oral Care and Turn Reposition  Additional Recommendations (Limitations, Scope, Preferences):  Full Scope Treatment  Psycho-social/Spiritual:   Desire for further Chaplaincy support:yes  Additional Recommendations:  Referral to Community Resources   Prognosis:   Unable to determine  Discharge Planning: To Be Determined      Primary Diagnoses: Present on Admission: . Schizoaffective disorder ([place]) . HLD (hyperlipidemia) . Essential hypertension . Acute on chronic respiratory failure ([place]) . COPD (chronic obstructive pulmonary disease) ([place]) . Hypertension . CKD (chronic kidney disease) stage 3, GFR 30-59 ml/min (HCC) . Acute respiratory failure ([place]) . Pulmonary hypertension ([place])   I have reviewed the medical record, interviewed the patient and family, and examined the patient. The following aspects are  pertinent.  Past Medical History:  Diagnosis Date  . Acute respiratory failure ([place]) 06/09/2017  . Arthritis   . Back pain   . Bell's palsy   . Bipolar disorder ([place])   . Bronchitis   . Chronic low back pain 05/09/2015  . COPD (chronic obstructive pulmonary disease) ([place])    hyoxia during sleep- using O2 as needed  . Cough with expectoration 05/21/16   recent diagnosis of bronchitis  . Cyst of right kidney   . Diabetes mellitus without complication ([place])    type 2  . Frequency of urination   . GERD (gastroesophageal reflux disease)   . Glaucoma   . History of blood transfusion   . History of colon polyps   . History of hiatal hernia   . History of tobacco use   . Hypercalcemia   . Hyperlipidemia   . Hypertension   . Memory disorder 09/05/2014  . On home oxygen therapy    3L/M [place] at night   . Schizo-affective psychosis ([place])   . Shortness of breath dyspnea    exertion, or with out oxygen  . Uterine fibroid    Social History   Socioeconomic History  . Marital status: Divorced    Spouse name: Not on file  . Number of children: 1  . Years of education: college 4  . Highest education level: Not on file  Occupational History  . Occupation: disabled  Social Needs  . Financial resource strain: Not on file  . Food insecurity:    Worry: Not on file    Inability: Not on file  . Transportation needs:    Medical: Not on file    Non-medical: Not on file  Tobacco Use  . Smoking status: Former Smoker    Packs/day: 1.00    Years: 28.00    Pack years: 28.00    Types: Cigarettes    Last attempt to quit: 07/15/2015    Years since quitting: 3.4  . Smokeless tobacco: Never Used  Substance and Sexual Activity  . Alcohol use: No    Alcohol/week: 0.0 standard drinks  . Drug use: No  . Sexual activity: Never    Birth control/protection: None  Lifestyle  . Physical activity:    Days per week: Not on file    Minutes per session: Not on file  . Stress: Not on file    Relationships  . Social connections:    Talks on phone: Not on file    Gets together: Not on file    Attends religious service: Not on file    Active member of club or organization: Not on file    Attends meetings of clubs or organizations: Not on file    Relationship status: Not on file  Other Topics Concern  . Not on file  Social History Narrative   Patient is right handed.   Patient drinks 2 cups caffeine daily.   Family History  Problem Relation Age of Onset  . Dementia Mother   . Alzheimer's disease Mother   . Heart disease Mother   . Hypertension Mother   . Diabetes Mother   . Diabetes Father   . Alzheimer's disease Sister   . Heart disease Brother   . Heart attack Brother   . Bladder Cancer Neg Hx   . Kidney cancer Neg Hx   . Prostate cancer Neg Hx    Scheduled Meds: . amLODipine  10 mg Oral Daily  . ARIPiprazole  20 mg Oral QHS  . aspirin EC  81 mg Oral Daily  . atorvastatin  20 mg Oral QPC supper  . donepezil  5 mg Oral QHS  . dorzolamide-timolol  1 drop Both Eyes BID  . enoxaparin (LOVENOX) injection  30 mg Subcutaneous Daily  . fenofibrate  160 mg Oral Daily  . ferrous sulfate  325 mg Oral QPC supper  . furosemide  80 mg Oral Daily  . guaiFENesin  600 mg Oral BID  . insulin aspart  0-5 Units Subcutaneous QHS  . insulin aspart  0-9 Units Subcutaneous TID WC  . lamoTRIgine  100 mg Oral QPC supper  . latanoprost  1 drop Both Eyes QHS  . macitentan  10 mg Oral Daily  . magnesium oxide  400 mg Oral Q48H  . mirtazapine  7.5 mg Oral QHS  . mometasone-formoterol  2 puff Inhalation BID  . predniSONE  40 mg Oral Q breakfast  . sildenafil  20 mg Oral TID   Continuous Infusions: PRN Meds:.acetaminophen, albuterol, bisacodyl, ipratropium-albuterol, lactulose, MUSCLE RUB, polyethylene glycol, senna Medications Prior to Admission:  Prior to Admission medications   Medication Sig Start Date End Date Taking? Authorizing Provider  acetaminophen (TYLENOL) 650 MG CR  tablet Take 1,300 mg by mouth every 12 (twelve) hours as needed for pain.    Yes [provider]  albuterol (PROAIR HFA) 108 (90 Base) MCG/ACT inhaler Inhale 2 puffs into the lungs every 6 (six) hours as needed for wheezing or shortness of breath.   Yes [provider]  albuterol (PROVENTIL) (2.5 MG/3ML) 0.083% nebulizer solution Take 2.5 mg by nebulization every 4 (four) hours as needed for wheezing or shortness of breath. PLAN C   Yes [provider]  ARIPiprazole (ABILIFY) 20 MG tablet Take 1 tablet (20 mg total) by mouth at bedtime. 08/27/14  Yes Kerrie Buffalo, NP  ASPERCREME LIDOCAINE EX Apply 1 application topically 2 (two) times daily as needed (back pain).   Yes [provider]  aspirin EC 81 MG EC tablet Take 1 tablet (81 mg total) by mouth daily. 06/15/17  Yes Regalado, Belkys A, MD  atorvastatin (LIPITOR) 20 MG tablet Take 20 mg by mouth daily after supper.   Yes [provider]  bisacodyl (DULCOLAX) 10 MG suppository Place 1 suppository (10 mg total) rectally daily as needed for moderate constipation. 09/08/16  Yes Vann, Jessica U, DO  budesonide-formoterol (SYMBICORT) 160-4.5 MCG/ACT inhaler Inhale 2 puffs into the lungs 2 (two) times daily.   Yes [provider]  donepezil (ARICEPT) 5 MG tablet Take 1 tablet (5 mg total) by mouth at bedtime. 08/27/14  Yes Kerrie Buffalo, NP  dorzolamide-timolol (COSOPT) 22.3-6.8 MG/ML ophthalmic solution Place 1 drop into both eyes 2 (two) times daily.   Yes [provider]  ferrous sulfate 325 (65 FE) MG tablet Take 325 mg by mouth daily after supper.   Yes [provider]  furosemide (LASIX) 40 MG tablet Take 1  tablet (40 mg total) by mouth daily. 07/14/17  Yes Mariel Aloe, MD  glimepiride (AMARYL) 4 MG tablet Take 0.5 tablets (2 mg total) by mouth daily with breakfast. 06/14/17  Yes Regalado, Belkys A, MD  Ipratropium-Albuterol (COMBIVENT RESPIMAT) 20-100 MCG/ACT AERS respimat Inhale  1-2 puffs into the lungs every 4 (four) hours as needed for wheezing. PLAN B    Yes [provider]  lamoTRIgine (LAMICTAL) 100 MG tablet Take 1 tablet (100 mg total) by mouth every evening. Patient taking differently: Take 100 mg by mouth daily after supper.  08/27/14  Yes Kerrie Buffalo, NP  latanoprost (XALATAN) 0.005 % ophthalmic solution Place 1 drop into both eyes at bedtime. 08/27/14  Yes Kerrie Buffalo, NP  Magnesium 250 MG TABS Take 250 mg by mouth See admin instructions. Take 1 tablet (250 mg) by mouth every other day after supper   Yes [provider]  Menthol, Topical Analgesic, (BIOFREEZE EX) Apply 1 application topically 2 (two) times daily as needed (back pain).   Yes [provider]  metFORMIN (GLUCOPHAGE) 1000 MG tablet Take 1 tablet (1,000 mg total) by mouth daily with breakfast. Patient taking differently: Take 1,000 mg by mouth daily with breakfast. If not eating well, give 500 mg twice daily instead of 1048m daily 07/13/17  Yes NMariel Aloe MD  OPSUMIT 10 MG tablet Take 10 mg by mouth daily.  10/17/18  Yes [provider]  OXYGEN Inhale 5 L into the lungs See admin instructions.    Yes [provider]  Phenylephrine-APAP-Guaifenesin (MUCINEX SINUS-MAX) 10-650-400 MG/20ML LIQD Take 20 mLs by mouth 2 (two) times daily.   Yes [provider]  polyethylene glycol (MIRALAX / GLYCOLAX) packet Take 17 g by mouth daily. Patient taking differently: Take 17 g by mouth daily as needed (for constipation). Mix in 4-8 oz liquid and drink 09/08/16  Yes Vann, Jessica U, DO  Selenium Sulf-Pyrithione-Urea 2.25 % SHAM Apply 1 application topically See admin instructions. Apply topically to scalp for dermatitis once every 14 days 01/25/15  Yes [provider]  senna (SENOKOT) 8.6 MG tablet Take 1 tablet by mouth daily as needed for constipation.    Yes [provider]  sildenafil (REVATIO) 20 MG tablet Take 1 tablet (20 mg  total) by mouth 3 (three) times daily. 06/14/17  Yes Regalado, Belkys A, MD  feeding supplement (BOOST / RESOURCE BREEZE) LIQD Take 1 Container by mouth 3 (three) times daily between meals. 07/13/17   NMariel Aloe MD  glucose blood (ACCU-CHEK AVIVA) test strip 1 each by Other route 2 (two) times daily. 08/04/16   BJearld Fenton NP  lactulose (CHRONULAC) 10 GM/15ML solution Take 30 mLs (20 g total) by mouth 2 (two) times daily as needed for mild constipation. Patient not taking: Reported on 01/03/2019 09/08/16   VGeradine Girt DO  Respiratory Therapy Supplies (FLUTTER) DEVI Use as directed. 05/26/17   Parrett, TFonnie Mu NP   Allergies  Allergen Reactions  . Codeine Nausea And Vomiting  . Nitrostat [Nitroglycerin] Other (See Comments)    Pt is on revatio  . Prednisone Other (See Comments)    Has to monitor because she is a diabetic    Review of Systems  Unable to perform ROS: Other    Physical Exam Vitals signs and nursing note reviewed.  Constitutional:      Comments: Somnolent Easily awakens, appears very fatigued  HENT:     Head: Normocephalic and atraumatic.  Neck:  Musculoskeletal: Normal range of motion.  Cardiovascular:     Rate and Rhythm: Normal rate.  Pulmonary:     Comments: Weak inspiratory effort Musculoskeletal:     Right lower leg: Edema present.     Left lower leg: Edema present.  Skin:    General: Skin is warm and dry.  Neurological:     General: No focal deficit present.     Mental Status: She is oriented to person, place, and time.  Psychiatric:        Mood and Affect: Mood normal.        Behavior: Behavior normal.     Vital Signs: BP 125/65 (BP Location: Right Arm)   Pulse 73   Temp 98.2 F (36.8 C) (Oral)   Resp 15   Ht 5' 3.5" (1.613 m)   Wt 86.8 kg   SpO2 95%   BMI 33.37 kg/m  Pain Scale: 0-10   Pain Score: 0-No pain   SpO2: SpO2: 95 % O2 Device:SpO2: 95 % O2 Flow Rate: .O2 Flow Rate (L/min): 4 L/min  IO: Intake/output summary:     Intake/Output Summary (Last 24 hours) at 01/08/2019 1004 Last data filed at 01/08/2019 0908 Gross per 24 hour  Intake 300 ml  Output 2000 ml  Net -1700 ml    LBM: Last BM Date: 01/06/19 Baseline Weight: Weight: 87.5 kg Most recent weight: Weight: 86.8 kg     Palliative Assessment/Data:   Flowsheet Rows     Most Recent Value  Intake Tab  Referral Department  Hospitalist  Unit at Time of Referral  Med/Surg Unit  Palliative Care Primary Diagnosis  Pulmonary  Date Notified  01/07/19  Reason for referral  Clarify Goals of Care, Psychosocial or Spiritual support  Date of Admission  01/03/19  Date first seen by Palliative Care  01/08/19  # of days Palliative referral response time  1 Day(s)  # of days IP prior to Palliative referral  4  Clinical Assessment  Palliative Performance Scale Score  40%  Pain Max last 24 hours  Not able to report  Pain Min Last 24 hours  Not able to report  Dyspnea Max Last 24 Hours  Not able to report  Dyspnea Min Last 24 hours  Not able to report  Nausea Max Last 24 Hours  Not able to report  Nausea Min Last 24 Hours  Not able to report  Anxiety Max Last 24 Hours  Not able to report  Anxiety Min Last 24 Hours  Not able to report  Other Max Last 24 Hours  Not able to report  Psychosocial & Spiritual Assessment  Palliative Care Outcomes  Patient/Family meeting held?  Yes  Who was at the meeting?  pt and pt's niece  [place] regarding hospice, Provided advance care planning, Provided psychosocial or spiritual support      Time In: 0900 Time Out: 1013 Time Total: 73 min Greater than 50%  of this time was spent counseling and coordinating care related to the above assessment and plan.  Signed by: Dory Horn, NP   Please contact Palliative Medicine Team phone at [phone] for questions and concerns.  For individual provider: See Shea Evans

## 2019-01-09 LAB — GLUCOSE, CAPILLARY
Glucose-Capillary: 122 mg/dL — ABNORMAL HIGH (ref 70–99)
Glucose-Capillary: 157 mg/dL — ABNORMAL HIGH (ref 70–99)
Glucose-Capillary: 232 mg/dL — ABNORMAL HIGH (ref 70–99)
Glucose-Capillary: 235 mg/dL — ABNORMAL HIGH (ref 70–99)

## 2019-01-09 NOTE — Progress Notes (Signed)
   01/09/19 1500  Clinical Encounter Type  Visited With Patient and family together  Visit Type Initial;Other (Comment);Social support (AD request--ready to notarize)  Referral From Chaplain  Spiritual Encounters  Spiritual Needs Emotional  Stress Factors  Patient Stress Factors Health changes   Visited from referral from another chaplain who had worked w/ pt earlier today.  Reviewed AD, called notary and witnesses, made copies, put one copy in shadow chart binder, and returned original and 1 copy to pt room.  Myra Gianotti resident, [phone]

## 2019-01-09 NOTE — Care Management Note (Signed)
Case Management Note Marvetta Gibbons RN, BSN Transitions of Care Unit 4E- RN Case Manager [phone]  Patient Details  Name: Elizabeth Thompson MRN: [id number] Date of Birth: 07-Oct-1951  Subjective/Objective:   Pt admitted with acute on chronic respiratory failure, hx of pulmonary HTN, COPD  Action/Plan: PTA pt lived at home with niece. CM spoke with pt and niece at bedside for transition of care needs. Per conversation niece states she would like to look at [facility] options for a few weeks of rehab prior to return home. Pt has been at [facility] in past however niece would prefer a closer SNF if possible. Will have CSW consult for possible placement options. Also discussed option of return home. Pt currently has home 02 with [facility] baseline was 3L with tank capable of going to 5L- niece asking about need for higher capacity tank and HF- will have to see if pt qualifying for this and if so will need new home 02 orders and will update [facility] for new home 02 needs. Pt has rollator, BSC, and shower chair at home, requesting a hospital bed if returns home. List provided to niece for [facility] agencies Per CMS guidelines from medicare.gov website with star ratings (copy placed in shadow chart) to review if choice if made to return home. CM f/u with pt and niece for transition plan STSNF vs Home with [place] and DME needs.   Expected Discharge Date:                  Expected Discharge Plan:  [place]  In-House Referral:  Clinical Social Work  Discharge planning Services  CM Consult  Post Acute Care Choice:  Durable Medical Equipment, Home Health Choice offered to:  Patient, [facility] POA / Guardian  DME Arranged:    DME Agency:     HH Arranged:    [place] Agency:     Status of Service:  In process, will continue to follow  If discussed at Long Length of Stay Meetings, dates discussed:    Discharge Disposition:   Additional Comments:  Dawayne Patricia, RN 01/09/2019, 11:52 AM

## 2019-01-09 NOTE — Progress Notes (Signed)
   01/09/19 1101  Clinical Encounter Type  Visited With Patient and family together  Visit Type Initial;Other (Comment) (AD)  Referral From Palliative care team  Consult/Referral To Chaplain  The chaplain responded to PMT consult for AD completion.  The chaplain met with the Pt. and the Pt. niece-[name].  After education and discussion, the AD was completed and is waiting for the notary. The chaplain left the document in the Pt. room and will contact the notary for F/U.  The chaplain concluded with prayer and an invitation to provide F/U spiritual care as needed.

## 2019-01-09 NOTE — Progress Notes (Signed)
Physical Therapy Treatment Patient Details Name: Elizabeth Thompson MRN: [id number] DOB: 03-Jun-1951 Today's Date: 01/09/2019    History of Present Illness Pt is a 68 y/o female with a PMH significant for chronic hypoxic respiratory failure on 5 L of oxygen at home, hypertension, hyperlipidemia, COPD, severe pulmonary hypertension, diabetes mellitus type 2, chronic kidney disease stage IV and schizo affective disorder. She presents to PT with acute exacerbation of chronic respiratory failure.     PT Comments    Pt with significant improved today. Pt tolerated amb with RW at 200' with min assist. With verbal cues to take deep breaths and standing rest breaks pt able to maintain SpO2 >90% on 4Lo2 via [place]. Pt didn't require seated rest break today. Niece present and very excited about patient progress. Acute PT to cont to follow.   Follow Up Recommendations  Home health PT;Supervision/Assistance - 24 hour     Equipment Recommendations  Rolling walker with 5" wheels    Recommendations for Other Services       Precautions / Restrictions Precautions Precautions: Fall Precaution Comments: 5L/min supplemental O2 at baseline Restrictions Weight Bearing Restrictions: No    Mobility  Bed Mobility Overal bed mobility: Needs Assistance Bed Mobility: Supine to Sit     Supine to sit: Min guard     General bed mobility comments: HOB elevated  Transfers Overall transfer level: Needs assistance Equipment used: Rolling walker (2 wheeled) Transfers: Sit to/from Stand Sit to Stand: Min assist         General transfer comment: Assist for balance support and safety during power-up to full stand. VC's for hand placement on seated surface for safety.   Ambulation/Gait Ambulation/Gait assistance: Min assist Gait Distance (Feet): 200 Feet Assistive device: Rolling walker (2 wheeled) Gait Pattern/deviations: Step-through pattern;Decreased stride length Gait velocity: decreased Gait velocity  interpretation: <1.31 ft/sec, indicative of household ambulator General Gait Details: slow, 4-6 standing rest breaks, SpO 2 decreased into 70s on 4lO2 via [place] but when cued to take deep breaths pt immeadiately SpO2 increased into 90s. Pt didn't require a sitting rest break   Stairs             Wheelchair Mobility    Modified Rankin (Stroke Patients Only)       Balance Overall balance assessment: Needs assistance Sitting-balance support: Feet supported;No upper extremity supported Sitting balance-Leahy Scale: Fair Sitting balance - Comments: pt instructed to scoot to EOB to place feet on floor to balance   Standing balance support: Bilateral upper extremity supported;During functional activity Standing balance-Leahy Scale: Poor Standing balance comment: Reliant on UE support                            Cognition Arousal/Alertness: Awake/alert Behavior During Therapy: Flat affect Overall Cognitive Status: Within Functional Limits for tasks assessed                                 General Comments: soft spoken, followed commands but didn't engage in conversation, slightly delayed      Exercises      General Comments        Pertinent Vitals/Pain Pain Assessment: No/denies pain    Home Living                      Prior Function  PT Goals (current goals can now be found in the care plan section) Progress towards PT goals: Progressing toward goals    Frequency    Min 3X/week      PT Plan Current plan remains appropriate    Co-evaluation              AM-PAC PT "6 Clicks" Mobility   Outcome Measure  Help needed turning from your back to your side while in a flat bed without using bedrails?: A Little Help needed moving from lying on your back to sitting on the side of a flat bed without using bedrails?: A Little Help needed moving to and from a bed to a chair (including a wheelchair)?: A Little Help  needed standing up from a chair using your arms (e.g., wheelchair or bedside chair)?: A Little Help needed to walk in hospital room?: A Little Help needed climbing 3-5 steps with a railing? : A Lot 6 Click Score: 17    End of Session Equipment Utilized During Treatment: Gait belt Activity Tolerance: Patient tolerated treatment well Patient left: in chair;with call bell/phone within reach;with chair alarm set;with family/visitor present Nurse Communication: Mobility status PT Visit Diagnosis: Unsteadiness on feet (R26.81);Muscle weakness (generalized) (M62.81);Difficulty in walking, not elsewhere classified (R26.2)     Time: 7944-4619 PT Time Calculation (min) (ACUTE ONLY): 29 min  Charges:  $Gait Training: 23-37 mins                     Kittie Plater, PT, DPT Acute Rehabilitation Services Pager #: [phone] Office #: [phone]    Berline Lopes 01/09/2019, 2:08 PM

## 2019-01-09 NOTE — Progress Notes (Signed)
PROGRESS NOTE    Elizabeth Thompson  JSE:[id number] DOB: 05/14/1951 DOA: 01/03/2019 PCP: Everardo Beals, NP  Outpatient Specialists:   Brief Narrative: Elizabeth Thompson is a 68 y.o. female with medical history significant for chronic hypoxic respiratory failure on 5 L of oxygen at home, hypertension, hyperlipidemia, COPD, severe pulmonary hypertension, diabetes mellitus type 2, chronic kidney disease stage IV and schizo affective disorder.  Patient presented to the hospital with acute on chronic dyspnea.  Apparently, patient has had worsening of shortness of breath for about 3 weeks, with associated cough that is intermittently productive of greenish sputum.  No fever or chills.  No sick contacts.  On presentation to the hospital, patient was tachypneic, with increased work of breathing and oxygen saturations in the low 60s on her baseline of 5 L nasal cannula.  On a nonrebreather at 12 L, patient maintained sats in the 90s.  On presentation to the ER, patient was treated with Solu-Medrol 125 mg IV x1, albuterol 2.5 mg neb, Rocephin and azithromycin to empirically cover CAP, and empiric heparin drip to cover potential PE.  01/04/2019: Patient was seen alongside patient's niece.  Patient is currently undergoing an echocardiogram.  Input from pulmonary team is highly appreciated.  Patient is on treatment for pulmonary hypertension.  01/05/2019: Patient seen.  Oxygen requirement is down to 6 L/min via nasal cannula.  However, patient remains dyspneic on minimal exertion.  Pulmonary input is appreciated.  Aggressive diuresis for now.  Monitor renal function, electrolytes and blood pressure.  Other management will depend on hospital course.  01/06/2019: Patient seen alongside patient's niece.  ABG done earlier today revealed pH of 7.37, PCO2 of 45.5 and PO2 of 73.8 and this was done on 50% FiO2.  Pulmonary input is appreciated.  Pulmonary team is directing care.  Results of echo done earlier still  pending.  01/07/2019: Patient remains dyspneic.  Awaiting echocardiogram report.  Will change IV Lasix to Lasix 80 mg p.o. once daily.  Continue to monitor renal function and electrolytes.  Will consult palliative care to address goal of care and CODE STATUS.  Guarded prognosis.  12/09/2019: Patient seen alongside patient's nurse and niece.  Introduced discharge needs.  Case management will be consulted.  The niece wants to speak to palliative care as well.  Pursue disposition.  01/09/2019: Patient seen.  Patient looks a lot better today.  Patient is stable for discharge.  Patient and patient's caregiver are considering the option of possible short-term rehab.  Patient is medically stable for discharge.  Pursue disposition.  Assessment & Plan:   Principal Problem:   Acute on chronic respiratory failure (HCC) Active Problems:   Schizoaffective disorder (HCC)   Diabetes mellitus type 2, controlled, without complications (HCC)   HLD (hyperlipidemia)   Essential hypertension   Dyspnea   Hypertension   COPD (chronic obstructive pulmonary disease) (HCC)   Diabetes mellitus without complication (HCC)   Pulmonary hypertension (HCC)   CKD (chronic kidney disease) stage 3, GFR 30-59 ml/min (HCC)   AKI (acute kidney injury) ([place])   Acute respiratory failure (HCC)   OSA (obstructive sleep apnea)   Goals of care, counseling/discussion   Palliative care by specialist  Acute on chronic hypoxic resp failure:  -Patient has chronic pulmonary disease (severe pulmonary hypertension and query COPD). -Possible current viral syndrome. -Lung examination is not indicative of COPD exacerbation -Pulmonary input is highly appreciated -Continue current management -Follow echo report -Follow-up VQ scan -Is currently on heparin. 01/05/2019: Pulmonary team is directing  care.  ABG in the morning as per pulmonary team.  Stable outpatient sleep study.  Follow echocardiogram.  Pursue VQ scan. 01/06/2019: VQ scan  revealed very low probability for PE.  Oxygen requirement is decreasing.  We will continue current management. 01/07/2019: Follow echocardiogram report.  DC IV Lasix.  Start oral Lasix 80 mg p.o. once daily. 01/08/2019: Optimize.  Echo report noted, EF of 55 to 74%, grade 1 diastolic dysfunction with moderately decreased right ventricle systolic function.  Pursue disposition. 01/09/2019: Patient seen.  Patient looks a lot better today.  Patient is stable for discharge.  Patient and patient's caregiver are considering the option of possible short-term rehab.  Patient is medically stable for discharge.  Pursue disposition.  CKD IV: This is at baseline. Continue to monitor closely.  HTN -Blood pressure is optimized.    HLD -cont statin  DM2 -carb controlled diet -hold oral meds -SSI  Pulmonary HTN -cont Revatio and Macitentan (Opsumit) -Pulmonary input is highly appreciated. -Guarded prognosis.  Palliative care input is highly appreciated. Echo report is noted.  Possible acute on chronic diastolic CHF: Aggressive diuresis. Continue to monitor renal function and electrolytes. DC IV Lasix.  Oral Lasix.   Echo report is as documented above. 01/09/2019: Volume status is optimized.  DVT prophylaxis: lovenox Code Status:  Full  Family Communication: niece at bedside [name])  Disposition Plan:   Pursue disposition.   Consults called: PCCM   Procedures:   Echocardiogram  Antimicrobials:   IV Rocephin discontinued.  IV azithromycin discontinued.   Subjective: No new complaints.   Looks more stable today.   No fever or chills.  Objective: Vitals:   01/09/19 0857 01/09/19 0957 01/09/19 1233 01/09/19 1638  BP:  (!) 107/51 (!) 100/37 111/63  Pulse:  79 89 84  Resp:  _0 Temp:  98.2 F (36.8 C) 98.3 F (36.8 C) 98.9 F (37.2 C)  TempSrc:  Oral Oral Oral  SpO2: 100% 97%  96%  Weight:      Height:        Intake/Output Summary (Last 24 hours) at 01/09/2019  1639 Last data filed at 01/09/2019 1236 Gross per 24 hour  Intake 160 ml  Output 1525 ml  Net -1365 ml   Filed Weights   01/03/19 1400 01/07/19 0521  Weight: 87.5 kg 86.8 kg    Examination:  General exam: Appears calm and comfortable.  Pale. Respiratory system: Decreased air entry globally. Cardiovascular system: S1 & S2 heard. No pedal edema. Gastrointestinal system: Abdomen is nondistended, soft and nontender. No organomegaly or masses felt. Normal bowel sounds heard. Central nervous system: Alert and oriented.  Patient moves all limbs.    Data Reviewed: I have personally reviewed following labs and imaging studies  CBC: Recent Labs  Lab 01/03/19 1300 01/03/19 1343 01/04/19 0325 01/06/19 0244 01/07/19 0241  WBC 5.5  --  2.9* 5.6 4.5  NEUTROABS 3.2  --   --  3.8 3.0  HGB 10.4* 21.1* 9.8* 9.4* 9.1*  HCT 35.5* 62.0* 34.0* 31.4* 31.4*  MCV 82.4  --  81.9 82.2 81.1  PLT 449*  --  408* 420* 128*   Basic Metabolic Panel: Recent Labs  Lab 01/03/19 1300 01/03/19 1343 01/04/19 0325 01/06/19 0244 01/07/19 0241 01/08/19 0248  NA 131* 132* 134* 134* 133* 134*  K 4.4 4.3 4.4 4.1 3.9 4.0  CL 96*  --  98 100 96* 96*  CO2 23  --  _1 GLUCOSE 87  --  139* 113* 129* 115*  BUN 26*  --  30* 46* 52* 54*  CREATININE 2.28*  --  2.33* 2.55* 2.28* 2.40*  CALCIUM 10.0  --  10.0 9.9 9.7 10.2  MG  --   --   --  2.4 2.3  --   PHOS  --   --   --  4.8* 4.5 4.8*   GFR: Estimated Creatinine Clearance: 23.7 mL/min (A) (by C-G formula based on SCr of 2.4 mg/dL (H)). Liver Function Tests: Recent Labs  Lab 01/06/19 0244 01/07/19 0241 01/08/19 0248  ALBUMIN 4.0 3.9 4.1   No results for input(s): LIPASE, AMYLASE in the last 168 hours. No results for input(s): AMMONIA in the last 168 hours. Coagulation Profile: No results for input(s): INR, PROTIME in the last 168 hours. Cardiac Enzymes: Recent Labs  Lab 01/03/19 1300  TROPONINI <0.03   BNP (last 3 results) No results  for input(s): PROBNP in the last 8760 hours. HbA1C: No results for input(s): HGBA1C in the last 72 hours. CBG: Recent Labs  Lab 01/08/19 1609 01/08/19 2114 01/09/19 0613 01/09/19 1127 01/09/19 1637  GLUCAP 293* 217* 122* 157* 232*   Lipid Profile: No results for input(s): CHOL, HDL, LDLCALC, TRIG, CHOLHDL, LDLDIRECT in the last 72 hours. Thyroid Function Tests: No results for input(s): TSH, T4TOTAL, FREET4, T3FREE, THYROIDAB in the last 72 hours. Anemia Panel: No results for input(s): VITAMINB12, FOLATE, FERRITIN, TIBC, IRON, RETICCTPCT in the last 72 hours. Urine analysis:    Component Value Date/Time   COLORURINE YELLOW 07/02/2017 [place] 07/02/2017 1825   LABSPEC 1.006 07/02/2017 1825   PHURINE 5.0 07/02/2017 1825   GLUCOSEU NEGATIVE 07/02/2017 1825   HGBUR NEGATIVE 07/02/2017 1825   BILIRUBINUR NEGATIVE 07/02/2017 1825   KETONESUR NEGATIVE 07/02/2017 1825   PROTEINUR 30 (A) 07/02/2017 1825   UROBILINOGEN 0.2 08/17/2015 0109   NITRITE NEGATIVE 07/02/2017 1825   LEUKOCYTESUR NEGATIVE 07/02/2017 1825   Sepsis Labs: _0 (procalcitonin:4,lacticidven:4)  ) Recent Results (from the past 240 hour(s))  Respiratory Panel by PCR     Status: None   Collection Time: 01/03/19  9:55 PM  Result Value Ref Range Status   Adenovirus NOT DETECTED NOT DETECTED Final   Coronavirus 229E NOT DETECTED NOT DETECTED Final   Coronavirus HKU1 NOT DETECTED NOT DETECTED Final   Coronavirus NL63 NOT DETECTED NOT DETECTED Final   Coronavirus OC43 NOT DETECTED NOT DETECTED Final   Metapneumovirus NOT DETECTED NOT DETECTED Final   Rhinovirus / Enterovirus NOT DETECTED NOT DETECTED Final   Influenza A NOT DETECTED NOT DETECTED Final   Influenza B NOT DETECTED NOT DETECTED Final   Parainfluenza Virus 1 NOT DETECTED NOT DETECTED Final   Parainfluenza Virus 2 NOT DETECTED NOT DETECTED Final   Parainfluenza Virus 3 NOT DETECTED NOT DETECTED Final   Parainfluenza Virus 4 NOT  DETECTED NOT DETECTED Final   Respiratory Syncytial Virus NOT DETECTED NOT DETECTED Final   Bordetella pertussis NOT DETECTED NOT DETECTED Final   Chlamydophila pneumoniae NOT DETECTED NOT DETECTED Final   Mycoplasma pneumoniae NOT DETECTED NOT DETECTED Final    Comment: Performed at [facility], [place] [address] [place], [place] [address]         Radiology Studies: No results found.      Scheduled Meds: . amLODipine  10 mg Oral Daily  . ARIPiprazole  20 mg Oral QHS  . aspirin EC  81 mg Oral Daily  . atorvastatin  20 mg Oral QPC supper  .  donepezil  5 mg Oral QHS  . dorzolamide-timolol  1 drop Both Eyes BID  . enoxaparin (LOVENOX) injection  30 mg Subcutaneous Daily  . fenofibrate  160 mg Oral Daily  . ferrous sulfate  325 mg Oral QPC supper  . furosemide  80 mg Oral Daily  . guaiFENesin  600 mg Oral BID  . insulin aspart  0-5 Units Subcutaneous QHS  . insulin aspart  0-9 Units Subcutaneous TID WC  . lamoTRIgine  100 mg Oral QPC supper  . latanoprost  1 drop Both Eyes QHS  . macitentan  10 mg Oral Daily  . magnesium oxide  400 mg Oral Q48H  . mirtazapine  7.5 mg Oral QHS  . mometasone-formoterol  2 puff Inhalation BID  . predniSONE  40 mg Oral Q breakfast  . sildenafil  20 mg Oral TID   Continuous Infusions:    LOS: 6 days    Time spent: 25 minutes.    Dana Allan, MD  [facility] Pager #: [phone] 7PM-7AM contact night coverage as above

## 2019-01-10 LAB — GLUCOSE, CAPILLARY
Glucose-Capillary: 126 mg/dL — ABNORMAL HIGH (ref 70–99)
Glucose-Capillary: 168 mg/dL — ABNORMAL HIGH (ref 70–99)
Glucose-Capillary: 212 mg/dL — ABNORMAL HIGH (ref 70–99)
Glucose-Capillary: 259 mg/dL — ABNORMAL HIGH (ref 70–99)

## 2019-01-10 MED ORDER — PREDNISONE 20 MG PO TABS: 30.0000 mg | ORAL_TABLET | Freq: Every day | ORAL | Status: DC

## 2019-01-10 MED ORDER — PREDNISOLONE 5 MG PO TABS: 10.0000 mg | ORAL_TABLET | Freq: Every day | ORAL | Status: DC

## 2019-01-10 MED ORDER — PREDNISOLONE 5 MG PO TABS
30.0000 mg | ORAL_TABLET | Freq: Every day | ORAL | Status: DC
  Administered 2019-01-11: 30 mg via ORAL
  Filled 2019-01-10: qty 6

## 2019-01-10 MED ORDER — PREDNISOLONE 5 MG PO TABS: 20.0000 mg | ORAL_TABLET | Freq: Every day | ORAL | Status: DC

## 2019-01-10 NOTE — Progress Notes (Signed)
PROGRESS NOTE    Elizabeth Thompson  VOJ:[id number] DOB: 08-10-1951 DOA: 01/03/2019 PCP: Everardo Beals, NP  Outpatient Specialists:   Brief Narrative: Elizabeth Thompson is a 68 year old female, with past medical history significant for chronic hypoxic respiratory failure on 5 L of oxygen at home, hypertension, hyperlipidemia, COPD, severe pulmonary hypertension, diabetes mellitus type 2, chronic kidney disease stage IV and Schizo-affective disorder.  Patient presented to the hospital with acute on chronic dyspnea.  Apparently, patient has had worsening of shortness of breath for about 3 weeks prior to presentation, with associated cough that was intermittently productive of greenish sputum.  No fever or chills.  No sick contacts.  On presentation to the hospital, patient was tachypneic, with increased work of breathing and oxygen saturations in the low 60s on her baseline supplemental oxygen of 5 L per minute via nasal cannula.  On a nonrebreather at 12 L, patient maintained sats in the 90s.  On presentation to the ER, patient was treated with Solu-Medrol 125 mg IV x1, albuterol 2.5 mg neb, Rocephin and azithromycin to empirically cover CAP, and empiric heparin drip to cover potential PE.  Patient was admitted and worked up extensively.  Pulmonary team was consulted to assist with patient's management.  VQ scan was reported as very low probability for pulmonary embolism.  Heparin drip was discontinued afterwards.  Pneumonia was deemed less likely, and antibiotics discontinued.  Echocardiogram revealed diastolic dysfunction.  Patient was initially managed with IV diuretics for possible acute on chronic diastolic CHF, and has been transitioned back to oral Lasix.  To need to monitor renal function and electrolytes.  There were initial concerns for possible OSA, however, ABG done revealed pH of 7.37, PCO2 of 45.5 and PO2 of 73.8, and this was done on 50% FiO2.  The patient's oxygen requirement is down to 3  L/min via nasal cannula.  Patient is stable for discharge.  Physical therapy has recommended possible discharge home with patient's main care is looking into possibility of short-term rehab.  Kindly pursue disposition.  Patient has been optimized.     Assessment & Plan:   Principal Problem:   Acute on chronic respiratory failure (HCC) Active Problems:   Schizoaffective disorder (HCC)   Diabetes mellitus type 2, controlled, without complications (HCC)   HLD (hyperlipidemia)   Essential hypertension   Dyspnea   Hypertension   COPD (chronic obstructive pulmonary disease) (HCC)   Diabetes mellitus without complication (HCC)   Pulmonary hypertension (HCC)   CKD (chronic kidney disease) stage 3, GFR 30-59 ml/min (HCC)   AKI (acute kidney injury) ([place])   Acute respiratory failure (HCC)   OSA (obstructive sleep apnea)   Goals of care, counseling/discussion   Palliative care by specialist  Acute on chronic hypoxic resp failure:  -Patient has severe chronic pulmonary disease (severe pulmonary hypertension and query COPD). -Possible current viral syndrome. -Lung examination - not indicative of COPD exacerbation -Pulmonary input is highly appreciated -VQ scan is reported as very low probability for PE. -Echocardiogram reported diastolic dysfunction. -ABG done during the hospital stay is as documented above. -Continue oral prednisone 40 mg p.o. once daily, but will start to taper. -Continue breathing treatment. -Supplemental oxygen requirement is down to 3 L/min via nasal cannula.  Patient's baseline is 5 L/min via nasal cannula at home (prior to admission) -Patient is medically stable for discharge. -Long-term prognosis is guarded.  CKD IV: This is at baseline. Continue to monitor closely.  HTN -Blood pressure is optimized.    HLD -  cont statin  DM2 -carb controlled diet -hold oral meds -SSI  Pulmonary HTN -Continue Revatio and Macitentan (Opsumit) -Pulmonary input is  highly appreciated. -Guarded prognosis.   -Palliative care input is highly appreciated. -Echo report is noted.  Possible acute on chronic diastolic CHF: -Initially on IV Lasix 80 mg twice daily.   -Transitioned back to oral Lasix.   -Continue to monitor renal function, electrolytes and volume status.   -Echo report is noted (55 to 00%, grade 1 diastolic dysfunction, mild LVH and moderately reduced right ventricular systolic function)  DVT prophylaxis: lovenox Code Status:  Full  Family Communication: niece at bedside  Disposition Plan:   Pursue disposition.   Consults called: PCCM   Procedures:   Echocardiogram  Antimicrobials:   IV Rocephin discontinued.  IV azithromycin discontinued.   Subjective: No new complaints.   Looks stable. No shortness of breath No fever or chills.  Objective: Vitals:   01/10/19 0005 01/10/19 0443 01/10/19 0848 01/10/19 0900  BP: (!) 111/56 138/70  (!) 112/49  Pulse: 72 70 70 78  Resp: _0 Temp: 98.1 F (36.7 C) 97.9 F (36.6 C)  98.1 F (36.7 C)  TempSrc: Oral Oral  Oral  SpO2: 98% 95% 100% 99%  Weight:      Height:        Intake/Output Summary (Last 24 hours) at 01/10/2019 1431 Last data filed at 01/10/2019 0100 Gross per 24 hour  Intake -  Output 950 ml  Net -950 ml   Filed Weights   01/03/19 1400 01/07/19 0521  Weight: 87.5 kg 86.8 kg    Examination:  General exam: Appears calm and comfortable.  Pale. Respiratory system: Adequate air entry anteriorly Cardiovascular system: S1 & S2 heard. No pedal edema. Gastrointestinal system: Abdomen is nondistended, soft and nontender. No organomegaly or masses felt. Normal bowel sounds heard. Central nervous system: Alert and oriented.  Patient moves all limbs.    Data Reviewed: I have personally reviewed following labs and imaging studies  CBC: Recent Labs  Lab 01/04/19 0325 01/06/19 0244 01/07/19 0241  WBC 2.9* 5.6 4.5  NEUTROABS  --  3.8 3.0  HGB 9.8* 9.4*  9.1*  HCT 34.0* 31.4* 31.4*  MCV 81.9 82.2 81.1  PLT 408* 420* 762*   Basic Metabolic Panel: Recent Labs  Lab 01/04/19 0325 01/06/19 0244 01/07/19 0241 01/08/19 0248  NA 134* 134* 133* 134*  K 4.4 4.1 3.9 4.0  CL 98 100 96* 96*  CO2 _1 GLUCOSE 139* 113* 129* 115*  BUN 30* 46* 52* 54*  CREATININE 2.33* 2.55* 2.28* 2.40*  CALCIUM 10.0 9.9 9.7 10.2  MG  --  2.4 2.3  --   PHOS  --  4.8* 4.5 4.8*   GFR: Estimated Creatinine Clearance: 23.7 mL/min (A) (by C-G formula based on SCr of 2.4 mg/dL (H)). Liver Function Tests: Recent Labs  Lab 01/06/19 0244 01/07/19 0241 01/08/19 0248  ALBUMIN 4.0 3.9 4.1   No results for input(s): LIPASE, AMYLASE in the last 168 hours. No results for input(s): AMMONIA in the last 168 hours. Coagulation Profile: No results for input(s): INR, PROTIME in the last 168 hours. Cardiac Enzymes: No results for input(s): CKTOTAL, CKMB, CKMBINDEX, TROPONINI in the last 168 hours. BNP (last 3 results) No results for input(s): PROBNP in the last 8760 hours. HbA1C: No results for input(s): HGBA1C in the last 72 hours. CBG: Recent Labs  Lab 01/09/19 1127 01/09/19 1637 01/09/19 2103 01/10/19 2633 01/10/19  [place]* 232* 235* 126* 168*   Lipid Profile: No results for input(s): CHOL, HDL, LDLCALC, TRIG, CHOLHDL, LDLDIRECT in the last 72 hours. Thyroid Function Tests: No results for input(s): TSH, T4TOTAL, FREET4, T3FREE, THYROIDAB in the last 72 hours. Anemia Panel: No results for input(s): VITAMINB12, FOLATE, FERRITIN, TIBC, IRON, RETICCTPCT in the last 72 hours. Urine analysis:    Component Value Date/Time   COLORURINE YELLOW 07/02/2017 [place] 07/02/2017 1825   LABSPEC 1.006 07/02/2017 1825   PHURINE 5.0 07/02/2017 1825   GLUCOSEU NEGATIVE 07/02/2017 1825   HGBUR NEGATIVE 07/02/2017 1825   BILIRUBINUR NEGATIVE 07/02/2017 1825   KETONESUR NEGATIVE 07/02/2017 1825   PROTEINUR 30 (A) 07/02/2017 1825    UROBILINOGEN 0.2 08/17/2015 0109   NITRITE NEGATIVE 07/02/2017 1825   LEUKOCYTESUR NEGATIVE 07/02/2017 1825   Sepsis Labs: _0 (procalcitonin:4,lacticidven:4)  ) Recent Results (from the past 240 hour(s))  Respiratory Panel by PCR     Status: None   Collection Time: 01/03/19  9:55 PM  Result Value Ref Range Status   Adenovirus NOT DETECTED NOT DETECTED Final   Coronavirus 229E NOT DETECTED NOT DETECTED Final   Coronavirus HKU1 NOT DETECTED NOT DETECTED Final   Coronavirus NL63 NOT DETECTED NOT DETECTED Final   Coronavirus OC43 NOT DETECTED NOT DETECTED Final   Metapneumovirus NOT DETECTED NOT DETECTED Final   Rhinovirus / Enterovirus NOT DETECTED NOT DETECTED Final   Influenza A NOT DETECTED NOT DETECTED Final   Influenza B NOT DETECTED NOT DETECTED Final   Parainfluenza Virus 1 NOT DETECTED NOT DETECTED Final   Parainfluenza Virus 2 NOT DETECTED NOT DETECTED Final   Parainfluenza Virus 3 NOT DETECTED NOT DETECTED Final   Parainfluenza Virus 4 NOT DETECTED NOT DETECTED Final   Respiratory Syncytial Virus NOT DETECTED NOT DETECTED Final   Bordetella pertussis NOT DETECTED NOT DETECTED Final   Chlamydophila pneumoniae NOT DETECTED NOT DETECTED Final   Mycoplasma pneumoniae NOT DETECTED NOT DETECTED Final    Comment: Performed at [facility], [place] [address], [place], [place] [address]         Radiology Studies: No results found.      Scheduled Meds: . amLODipine  10 mg Oral Daily  . ARIPiprazole  20 mg Oral QHS  . aspirin EC  81 mg Oral Daily  . atorvastatin  20 mg Oral QPC supper  . donepezil  5 mg Oral QHS  . dorzolamide-timolol  1 drop Both Eyes BID  . enoxaparin (LOVENOX) injection  30 mg Subcutaneous Daily  . fenofibrate  160 mg Oral Daily  . ferrous sulfate  325 mg Oral QPC supper  . furosemide  80 mg Oral Daily  . guaiFENesin  600 mg Oral BID  . insulin aspart  0-5 Units Subcutaneous QHS  . insulin aspart  0-9 Units Subcutaneous TID WC  .  lamoTRIgine  100 mg Oral QPC supper  . latanoprost  1 drop Both Eyes QHS  . macitentan  10 mg Oral Daily  . magnesium oxide  400 mg Oral Q48H  . mirtazapine  7.5 mg Oral QHS  . mometasone-formoterol  2 puff Inhalation BID  . predniSONE  40 mg Oral Q breakfast  . sildenafil  20 mg Oral TID   Continuous Infusions:    LOS: 7 days    Time spent: 25 minutes.    Dana Allan, MD  [facility] Pager #: [phone] 7PM-7AM contact night coverage as above

## 2019-01-10 NOTE — Care Management Important Message (Signed)
Important Message  Patient Details  Name: Elizabeth Thompson MRN: [id number] Date of Birth: Nov 11, 1951   Medicare Important Message Given:  Yes    Tommy Medal 01/10/2019, 11:03 AM

## 2019-01-10 NOTE — Progress Notes (Signed)
Physical Therapy Treatment Patient Details Name: Elizabeth Thompson MRN: [id number] DOB: 04/03/1951 Today's Date: 01/10/2019    History of Present Illness Pt is a 68 y/o female with a PMH significant for chronic hypoxic respiratory failure on 5 L of oxygen at home, hypertension, hyperlipidemia, COPD, severe pulmonary hypertension, diabetes mellitus type 2, chronic kidney disease stage IV and schizo affective disorder. She presents to PT with acute exacerbation of chronic respiratory failure.     PT Comments    Patient seen for activity progression. Tolerated session well with increased time and effort. Ambulated extensive distance today compared to previous session. Utilized 4 liters [place] initially then increased to 6 liters. Multiple rest breaks. Patient with good awareness and use of pursed lip breathing to rebound during rest. Current POC remains appropriate.   Follow Up Recommendations  Home health PT;Supervision/Assistance - 24 hour     Equipment Recommendations  Rolling walker with 5" wheels    Recommendations for Other Services       Precautions / Restrictions Precautions Precautions: Fall Precaution Comments: 4L/min supplemental O2 at baseline Restrictions Weight Bearing Restrictions: No    Mobility  Bed Mobility Overal bed mobility: Needs Assistance Bed Mobility: Supine to Sit     Supine to sit: Min guard     General bed mobility comments: HOB elevated  Transfers Overall transfer level: Needs assistance Equipment used: Rolling walker (2 wheeled) Transfers: Sit to/from [facility] Sit to Stand: Min guard Stand pivot transfers: Min guard       General transfer comment: Min guard for safety during transition to [facility], and during power up from sitting x4  Ambulation/Gait Ambulation/Gait assistance: Min guard Gait Distance (Feet): 380 Feet Assistive device: Rolling walker (2 wheeled) Gait Pattern/deviations: Step-through pattern;Decreased stride  length Gait velocity: decreased Gait velocity interpretation: <1.31 ft/sec, indicative of household ambulator General Gait Details: VCs for increased cadence and upright posture, 2 seated rest breaks, 4 standing rest breaks. Ambulated on 4 liters [place] intiially increased to 6 liters upon return.    Stairs             Wheelchair Mobility    Modified Rankin (Stroke Patients Only)       Balance Overall balance assessment: Needs assistance Sitting-balance support: Feet supported;No upper extremity supported Sitting balance-Leahy Scale: Fair Sitting balance - Comments: pt instructed to scoot to EOB to place feet on floor to balance   Standing balance support: Bilateral upper extremity supported;During functional activity Standing balance-Leahy Scale: Poor Standing balance comment: Reliant on UE support                            Cognition Arousal/Alertness: Awake/alert Behavior During Therapy: Flat affect Overall Cognitive Status: Within Functional Limits for tasks assessed                                        Exercises      General Comments        Pertinent Vitals/Pain      Home Living                      Prior Function            PT Goals (current goals can now be found in the care plan section) Acute Rehab PT Goals Patient Stated Goal: get back home PT Goal  Formulation: With patient/family Time For Goal Achievement: 01/14/19 Potential to Achieve Goals: Good Progress towards PT goals: Progressing toward goals    Frequency    Min 3X/week      PT Plan Current plan remains appropriate    Co-evaluation              AM-PAC PT "6 Clicks" Mobility   Outcome Measure  Help needed turning from your back to your side while in a flat bed without using bedrails?: A Little Help needed moving from lying on your back to sitting on the side of a flat bed without using bedrails?: A Little Help needed moving to and  from a bed to a chair (including a wheelchair)?: A Little Help needed standing up from a chair using your arms (e.g., wheelchair or bedside chair)?: A Little Help needed to walk in hospital room?: A Little Help needed climbing 3-5 steps with a railing? : A Lot 6 Click Score: 17    End of Session Equipment Utilized During Treatment: Gait belt Activity Tolerance: Patient tolerated treatment well Patient left: in chair;with call bell/phone within reach;with chair alarm set;with family/visitor present Nurse Communication: Mobility status PT Visit Diagnosis: Unsteadiness on feet (R26.81);Muscle weakness (generalized) (M62.81);Difficulty in walking, not elsewhere classified (R26.2)     Time: 1594-5859 PT Time Calculation (min) (ACUTE ONLY): 23 min  Charges:  $Gait Training: 23-37 mins                     Alben Deeds, PT DPT  Board Certified Neurologic Specialist [place] Pager [phone] Office [place] 01/10/2019, 1:43 PM

## 2019-01-10 NOTE — Care Management Note (Signed)
Case Management Note Marvetta Gibbons RN, BSN Transitions of Care Unit 4E- RN Case Manager [phone]  Patient Details  Name: Elizabeth Thompson MRN: [id number] Date of Birth: [birth year]  Subjective/Objective:   Pt admitted with acute on chronic respiratory failure, hx of pulmonary HTN, COPD  Action/Plan: PTA pt lived at home with niece. CM spoke with pt and niece at bedside for transition of care needs. Per conversation niece states she would like to look at [facility] options for a few weeks of rehab prior to return home. Pt has been at [facility] in past however niece would prefer a closer SNF if possible. Will have CSW consult for possible placement options. Also discussed option of return home. Pt currently has home 02 with [facility] baseline was 3L with tank capable of going to 5L- niece asking about need for higher capacity tank and HF- will have to see if pt qualifying for this and if so will need new home 02 orders and will update [employer] for new home 02 needs. Pt has rollator, BSC, and shower chair at home, requesting a hospital bed if returns home. List provided to niece for [facility] agencies Per CMS guidelines from medicare.gov website with star ratings (copy placed in shadow chart) to review if choice if made to return home. CM f/u with pt and niece for transition plan STSNF vs Home with [place] and DME needs.   Expected Discharge Date:                  Expected Discharge Plan:  [place]  In-House Referral:  Clinical Social Work, [facility]  Discharge planning Services  CM Consult  Post Acute Care Choice:  Durable Medical Equipment, Home Health Choice offered to:  Patient, [place] / Guardian  DME Arranged:  Hospital bed DME Agency:  [place]:  RN, Disease Management, PT [place] Agency:  [facility]  Status of Service:  In process, will continue to follow  If discussed at Long Length of Stay Meetings, dates discussed:     Discharge Disposition: Home/home health   Additional Comments:  01/10/19- 1600- Azaryah Oleksy RN, CM- pt progressing well with PT- walked 380 feet today. F/u done with pt and niece regarding transition needs- pt unlikely to be approved for STSNF by insurance with current mobility level, discussed this with pt and niece who both state that they want to move forward with return home and have [facility] services- choice for [facility] agency will be [facility]. DME need- hospital bed- niece states they will not need delivery of bed prior to discharge- plan to transport pt home via private car. Pt now on 3L - will not need to change out concentrator at home. Spoke with Caryl Pina at [place] who states that [facility] can provide HF [place] for home. Call made to [facility] at [facility] for hospital bed- they will f/u with niece for home delivery. CM will f/u in am for final transition needs- per MD will plan on d/c on 1/29. Referral for HHRN/PT called to [place] with [facility], also called [place] with [facility] for referral.   Dawayne Patricia, RN 01/10/2019, 4:48 PM

## 2019-01-10 NOTE — Progress Notes (Signed)
Durable Medical Equipment  (From admission, onward)         Start     Ordered   01/10/19 1551  For home use only DME Hospital bed  Once    Comments:  Hospital bed  Question:  Bed type  Answer:  Semi-electric   01/10/19 1551         Pt has history of COPD, Pulmonary HTN. Pt requires the ability to reposition frequently. Head of bed must be elevated  Greater than 30 degrees.

## 2019-01-11 DIAGNOSIS — Z23 Encounter for immunization: Secondary | ICD-10-CM | POA: Diagnosis not present

## 2019-01-11 LAB — GLUCOSE, CAPILLARY
Glucose-Capillary: 98 mg/dL (ref 70–99)
Glucose-Capillary: 136 mg/dL — ABNORMAL HIGH (ref 70–99)

## 2019-01-11 MED ORDER — PREDNISONE 20 MG PO TABS: 40.0000 mg | ORAL_TABLET | Freq: Every day | ORAL | 0 refills | Status: DC

## 2019-01-11 MED ORDER — FUROSEMIDE 80 MG PO TABS: 80.0000 mg | ORAL_TABLET | Freq: Every day | ORAL | 0 refills | Status: DC

## 2019-01-11 MED ORDER — PREDNISOLONE 5 MG PO TABS: ORAL_TABLET | ORAL | 0 refills | Status: DC

## 2019-01-11 MED ORDER — AMLODIPINE BESYLATE 10 MG PO TABS: 10.0000 mg | ORAL_TABLET | Freq: Every day | ORAL | 0 refills | Status: DC

## 2019-01-11 NOTE — Discharge Summary (Signed)
Physician Discharge Summary  Elizabeth Thompson RNH:[id number] DOB: 1951/09/15 DOA: 01/03/2019  PCP: Everardo Beals, NP  Admit date: 01/03/2019 Discharge date: 01/11/2019  Admitted From: Home Disposition:  Home  Discharge Condition:Stable CODE STATUS:FULL Diet recommendation: Heart Healthy  Brief/Interim Summary:  Elizabeth Thompson a 68 year old female, with past medical history significant forchronic hypoxic respiratory failure on 5 L of oxygen at home, hypertension, hyperlipidemia, COPD, severe pulmonary hypertension, diabetes mellitus type 2, chronic kidney disease stage IV and Schizo-affective disorder.  Patient presented to the hospital withacute on chronic dyspnea.  Apparently, patient has had worsening of shortness of breath for about 3 weeks prior to presentation, with associated cough that was intermittently productive of greenish sputum.  No fever or chills.  No sick contacts.  On presentation to the hospital, patient was tachypneic, with increased work of breathing and oxygen saturations in the low 60s on her baseline supplemental oxygen of 5 L per minute via nasal cannula. On a nonrebreather at 12 L, patient maintained sats in the 90s.  On presentation to the ER, patient was treated with Solu-Medrol 125 mg IV x1, albuterol 2.5 mg neb, Rocephin and azithromycin to empirically cover CAP, and empiric heparin drip to cover potential PE. Patient was admitted and worked up extensively.  Pulmonary team was consulted to assist with patient's management.  VQ scan was reported as very low probability for pulmonary embolism.  Heparin drip was discontinued afterwards.  Pneumonia was deemed less likely, and antibiotics discontinued.  Echocardiogram revealed diastolic dysfunction.  Patient was initially managed with IV diuretics for possible acute on chronic diastolic CHF, and has been transitioned back to oral Lasix.  To need to monitor renal function and electrolytes.  There were initial  concerns for possible OSA, however, ABG done revealed pH of 7.37, PCO2 of 45.5 and PO2 of 73.8, and this was done on 50% FiO2.  The patient's oxygen requirement is down to 3 L/min via nasal cannula.  Currently her respiratory status is stable and is on baseline. Patient is stable for discharge.  She needs to follow-up with her pulmonologist as an outpatient.  Following problems were addressed during her  hospitalization: Acute on chronic hypoxic resp failure:  -Patient has severe chronic pulmonary disease (severe pulmonary hypertension and query COPD). -Lung examination - not indicative of COPD exacerbation -Pulmonary was following -VQ scan is reported as very low probability for PE. -Echocardiogram reported diastolic dysfunction. -ABG done during the hospital stay is as documented above. -Continue oral prednisone 40 mg p.o. once daily for next 5 days on discharge. -Continue bronchodilators at home -Supplemental oxygen requirement is down to 3 L/min via nasal cannula.  Patient's baseline is 5 L/min via nasal cannula at home (prior to admission) -Patient is medically stable for discharge. -Long-term prognosis is guarded.  CKD IV: This is at baseline. Continue to monitor closely.  HTN -Blood pressure is optimized.    HLD -cont statin  DM2 -carb controlled diet -resume home meds  Pulmonary HTN -Continue Revatio and Macitentan (Opsumit) -Pulmonary was following -She follows with Dr. Melvyn Novas as an outpatient.  Follow-up with him -Echo report is noted.  Possible acute on chronic diastolic CHF: -Initially on IV Lasix 80 mg twice daily.   -Transitioned back to oral Lasix.Now on 80 mg daily.  She was on 40 mg daily at home.   -Echo report is noted (55 to 38%, grade 1 diastolic dysfunction, mild LVH and moderately reduced right ventricular systolic function  Discharge Diagnoses:  Principal Problem:  Acute on chronic respiratory failure (HCC) Active Problems:   Schizoaffective  disorder (HCC)   Diabetes mellitus type 2, controlled, without complications (HCC)   HLD (hyperlipidemia)   Essential hypertension   Dyspnea   Hypertension   COPD (chronic obstructive pulmonary disease) (HCC)   Diabetes mellitus without complication (HCC)   Pulmonary hypertension (HCC)   CKD (chronic kidney disease) stage 3, GFR 30-59 ml/min (HCC)   AKI (acute kidney injury) ([place])   Acute respiratory failure (HCC)   OSA (obstructive sleep apnea)   Goals of care, counseling/discussion   Palliative care by specialist    Discharge Instructions  Discharge Instructions    Diet - low sodium heart healthy   Complete by:  As directed    Discharge instructions   Complete by:  As directed    1)Take prescribed medications as instructed. 2)Follow up with your PCP in a week.  Do a CBC, BMP test during follow-up. 3) Follow-up with pulmonology,Dr Melvyn Novas, as an outpatient in 4 weeks.   Increase activity slowly   Complete by:  As directed      Allergies as of 01/11/2019      Reactions   Codeine Nausea And Vomiting   Nitrostat [nitroglycerin] Other (See Comments)   Pt is on revatio   Prednisone Other (See Comments)   Has to monitor because she is a diabetic       Medication List    STOP taking these medications   lactulose 10 GM/15ML solution Commonly known as:  CHRONULAC     TAKE these medications   acetaminophen 650 MG CR tablet Commonly known as:  TYLENOL Take 1,300 mg by mouth every 12 (twelve) hours as needed for pain.   albuterol (2.5 MG/3ML) 0.083% nebulizer solution Commonly known as:  PROVENTIL Take 2.5 mg by nebulization every 4 (four) hours as needed for wheezing or shortness of breath. PLAN C   PROAIR HFA 108 (90 Base) MCG/ACT inhaler Generic drug:  albuterol Inhale 2 puffs into the lungs every 6 (six) hours as needed for wheezing or shortness of breath.   amLODipine 10 MG tablet Commonly known as:  NORVASC Take 1 tablet (10 mg total) by mouth daily. Start  taking on:  January 12, 2019 What changed:  See the new instructions.   ARIPiprazole 20 MG tablet Commonly known as:  ABILIFY Take 1 tablet (20 mg total) by mouth at bedtime.   ASPERCREME LIDOCAINE EX Apply 1 application topically 2 (two) times daily as needed (back pain).   aspirin 81 MG EC tablet Take 1 tablet (81 mg total) by mouth daily.   atorvastatin 20 MG tablet Commonly known as:  LIPITOR Take 20 mg by mouth daily after supper.   BIOFREEZE EX Apply 1 application topically 2 (two) times daily as needed (back pain).   bisacodyl 10 MG suppository Commonly known as:  DULCOLAX Place 1 suppository (10 mg total) rectally daily as needed for moderate constipation.   budesonide-formoterol 160-4.5 MCG/ACT inhaler Commonly known as:  SYMBICORT Inhale 2 puffs into the lungs 2 (two) times daily.   COMBIVENT RESPIMAT 20-100 MCG/ACT Aers respimat Generic drug:  Ipratropium-Albuterol Inhale 1-2 puffs into the lungs every 4 (four) hours as needed for wheezing. PLAN B   donepezil 5 MG tablet Commonly known as:  ARICEPT Take 1 tablet (5 mg total) by mouth at bedtime.   dorzolamide-timolol 22.3-6.8 MG/ML ophthalmic solution Commonly known as:  COSOPT Place 1 drop into both eyes 2 (two) times daily.   feeding supplement Liqd  Take 1 Container by mouth 3 (three) times daily between meals.   ferrous sulfate 325 (65 FE) MG tablet Take 325 mg by mouth daily after supper.   FLUTTER Devi Use as directed.   furosemide 80 MG tablet Commonly known as:  LASIX Take 1 tablet (80 mg total) by mouth daily. Start taking on:  January 12, 2019 What changed:    medication strength  how much to take   glimepiride 4 MG tablet Commonly known as:  AMARYL Take 0.5 tablets (2 mg total) by mouth daily with breakfast.   glucose blood test strip Commonly known as:  ACCU-CHEK AVIVA 1 each by Other route 2 (two) times daily.   lamoTRIgine 100 MG tablet Commonly known as:  LAMICTAL Take 1  tablet (100 mg total) by mouth every evening. What changed:  when to take this   latanoprost 0.005 % ophthalmic solution Commonly known as:  XALATAN Place 1 drop into both eyes at bedtime.   Magnesium 250 MG Tabs Take 250 mg by mouth See admin instructions. Take 1 tablet (250 mg) by mouth every other day after supper   metFORMIN 1000 MG tablet Commonly known as:  GLUCOPHAGE Take 1 tablet (1,000 mg total) by mouth daily with breakfast. What changed:  additional instructions   MUCINEX SINUS-MAX 10-650-400 MG/20ML Liqd Generic drug:  Phenylephrine-APAP-guaiFENesin Take 20 mLs by mouth 2 (two) times daily.   OPSUMIT 10 MG tablet Generic drug:  macitentan Take 10 mg by mouth daily.   OXYGEN Inhale 5 L into the lungs See admin instructions.   polyethylene glycol packet Commonly known as:  MIRALAX / GLYCOLAX Take 17 g by mouth daily. What changed:    when to take this  reasons to take this  additional instructions   prednisoLONE 5 MG Tabs tablet Take 30 mg daily for 2 days then 20 mg daily for 2 days then 10 mg daily for 3 days then stop Start taking on:  January 12, 2019   Selenium Sulf-Pyrithione-Urea 2.25 % Sham Apply 1 application topically See admin instructions. Apply topically to scalp for dermatitis once every 14 days   senna 8.6 MG tablet Commonly known as:  SENOKOT Take 1 tablet by mouth daily as needed for constipation.   sildenafil 20 MG tablet Commonly known as:  REVATIO Take 1 tablet (20 mg total) by mouth 3 (three) times daily.            Durable Medical Equipment  (From admission, onward)         Start     Ordered   01/10/19 1551  For home use only DME Hospital bed  Once    Comments:  Hospital bed  Question:  Bed type  Answer:  Semi-electric   01/10/19 1551         Follow-up Information    Everardo Beals, NP. Schedule an appointment as soon as possible for a visit in 1 week(s).   Contact information: [place] [address] [phone]        [facility] up.   Specialty:  [place] Why:  HHRN/PT arranged- they will call you to set up home visits Contact information: [place] [address] [phone]        [facility] - Dme Follow up.   Why:  hospital bed arranged- they will call to set up home delivery Contact information: [address] [place] [address] [phone]  Tanda Rockers, MD. Go on 01/16/2019.   Specialty:  Pulmonary Disease Why:  Appointment at 10:45 AM  Contact information: [place] [address] [phone]          Allergies  Allergen Reactions  . Codeine Nausea And Vomiting  . Nitrostat [Nitroglycerin] Other (See Comments)    Pt is on revatio  . Prednisone Other (See Comments)    Has to monitor because she is a diabetic     Consultations:  Pulmonary   Procedures/Studies: Nm Pulmonary Vent And Perf (v/q Scan)  Result Date: 01/04/2019 CLINICAL DATA:  Acute respiratory failure with cough. High probability for acute pulmonary embolism. On heparin therapy without history of pulmonary embolism. History of pulmonary hypertension. EXAM: NUCLEAR MEDICINE VENTILATION - PERFUSION LUNG SCAN TECHNIQUE: Ventilation images were obtained in multiple projections using inhaled aerosol Tc-46mDTPA. Perfusion images were obtained in multiple projections after intravenous injection of Tc-962mAA. RADIOPHARMACEUTICALS:  31.0 mCi of Tc-99100mPA aerosol inhalation and 4.3 mCi Tc99m60m IV COMPARISON:  Portable chest 01/03/2019. FINDINGS: Ventilation: Central clumping of the aerosol in the airways. No focal ventilatory defects. Perfusion: No wedge shaped peripheral perfusion defects to suggest acute pulmonary embolism. IMPRESSION: Very low probability for acute pulmonary embolism. Electronically Signed   By: WillRichardean Sale.   On: 01/04/2019 14:42   Dg  Chest Port 1 View  Result Date: 01/05/2019 CLINICAL DATA:  Acute respiratory failure with hypoxia. EXAM: PORTABLE CHEST 1 VIEW COMPARISON:  01/03/2019, 07/11/2017 and chest CT 06/09/2017 FINDINGS: Heart size is mildly prominent but may be accentuated by the AP technique. Again noted are prominent lung markings, particularly at the right lung base. Coarse interstitial lung markings are chronic and patient has underlying emphysema based on previous chest CT. No focal airspace disease or consolidation. Trachea is midline. Negative for a pneumothorax. No acute bone abnormality. IMPRESSION: Chronic lung changes without clear evidence of an acute process. Electronically Signed   By: AdamMarkus Daft.   On: 01/05/2019 08:39   Dg Chest Portable 1 View  Result Date: 01/03/2019 CLINICAL DATA:  Increased shortness of breath EXAM: PORTABLE CHEST 1 VIEW COMPARISON:  07/11/2017 FINDINGS: Bilateral mild interstitial thickening. No pleural effusion or pneumothorax. Stable cardiomediastinal silhouette. No acute osseous abnormality. IMPRESSION: Mild bilateral interstitial thickening which may reflect chronic interstitial disease versus mild interstitial edema or infection. Electronically Signed   By: HetaKathreen Devoidn: 01/03/2019 13:39      Subjective: Patient seen and examined at bedside this morning.  Remains comfortable.  Hemodynamically stable.  Currently respiratory status stable  Discharge Exam: Vitals:   01/11/19 0905 01/11/19 1122  BP:  120/60  Pulse: 75 66  Resp: 19 15  Temp:  (!) 96.3 F (35.7 C)  SpO2:  93%   Vitals:   01/10/19 2059 01/11/19 0822 01/11/19 0905 01/11/19 1122  BP:  113/66  120/60  Pulse:  70 75 66  Resp:  _0 Temp:  (!) 97.2 F (36.2 C)  (!) 96.3 F (35.7 C)  TempSrc:  Oral  Axillary  SpO2: 97% 100%  93%  Weight:      Height:        General: Pt is alert, awake, not in acute distress Cardiovascular: RRR, S1/S2 +, no rubs, no gallops Respiratory: CTA bilaterally, no  wheezing, no rhonchi Abdominal: Soft, NT, ND, bowel sounds + Extremities: no edema, no cyanosis    The results of significant diagnostics from this hospitalization (  including imaging, microbiology, ancillary and laboratory) are listed below for reference.     Microbiology: Recent Results (from the past 240 hour(s))  Respiratory Panel by PCR     Status: None   Collection Time: 01/03/19  9:55 PM  Result Value Ref Range Status   Adenovirus NOT DETECTED NOT DETECTED Final   Coronavirus 229E NOT DETECTED NOT DETECTED Final   Coronavirus HKU1 NOT DETECTED NOT DETECTED Final   Coronavirus NL63 NOT DETECTED NOT DETECTED Final   Coronavirus OC43 NOT DETECTED NOT DETECTED Final   Metapneumovirus NOT DETECTED NOT DETECTED Final   Rhinovirus / Enterovirus NOT DETECTED NOT DETECTED Final   Influenza A NOT DETECTED NOT DETECTED Final   Influenza B NOT DETECTED NOT DETECTED Final   Parainfluenza Virus 1 NOT DETECTED NOT DETECTED Final   Parainfluenza Virus 2 NOT DETECTED NOT DETECTED Final   Parainfluenza Virus 3 NOT DETECTED NOT DETECTED Final   Parainfluenza Virus 4 NOT DETECTED NOT DETECTED Final   Respiratory Syncytial Virus NOT DETECTED NOT DETECTED Final   Bordetella pertussis NOT DETECTED NOT DETECTED Final   Chlamydophila pneumoniae NOT DETECTED NOT DETECTED Final   Mycoplasma pneumoniae NOT DETECTED NOT DETECTED Final    Comment: Performed at [facility], [address] [place], [place] [address]     Labs: BNP (last 3 results) Recent Labs    01/03/19 1300  BNP 70.6   Basic Metabolic Panel: Recent Labs  Lab 01/06/19 0244 01/07/19 0241 01/08/19 0248  NA 134* 133* 134*  K 4.1 3.9 4.0  CL 100 96* 96*  CO2 _0 GLUCOSE 113* 129* 115*  BUN 46* 52* 54*  CREATININE 2.55* 2.28* 2.40*  CALCIUM 9.9 9.7 10.2  MG 2.4 2.3  --   PHOS 4.8* 4.5 4.8*   Liver Function Tests: Recent Labs  Lab 01/06/19 0244 01/07/19 0241 01/08/19 0248  ALBUMIN 4.0 3.9 4.1   No  results for input(s): LIPASE, AMYLASE in the last 168 hours. No results for input(s): AMMONIA in the last 168 hours. CBC: Recent Labs  Lab 01/06/19 0244 01/07/19 0241  WBC 5.6 4.5  NEUTROABS 3.8 3.0  HGB 9.4* 9.1*  HCT 31.4* 31.4*  MCV 82.2 81.1  PLT 420* 417*   Cardiac Enzymes: No results for input(s): CKTOTAL, CKMB, CKMBINDEX, TROPONINI in the last 168 hours. BNP: Invalid input(s): POCBNP CBG: Recent Labs  Lab 01/10/19 1132 01/10/19 1626 01/10/19 2138 01/11/19 0640 01/11/19 1123  GLUCAP 168* 212* 259* 98 136*   D-Dimer No results for input(s): DDIMER in the last 72 hours. Hgb A1c No results for input(s): HGBA1C in the last 72 hours. Lipid Profile No results for input(s): CHOL, HDL, LDLCALC, TRIG, CHOLHDL, LDLDIRECT in the last 72 hours. Thyroid function studies No results for input(s): TSH, T4TOTAL, T3FREE, THYROIDAB in the last 72 hours.  Invalid input(s): FREET3 Anemia work up No results for input(s): VITAMINB12, FOLATE, FERRITIN, TIBC, IRON, RETICCTPCT in the last 72 hours. Urinalysis    Component Value Date/Time   COLORURINE YELLOW 07/02/2017 1825   APPEARANCEUR CLEAR 07/02/2017 1825   LABSPEC 1.006 07/02/2017 1825   PHURINE 5.0 07/02/2017 1825   GLUCOSEU NEGATIVE 07/02/2017 1825   HGBUR NEGATIVE 07/02/2017 1825   BILIRUBINUR NEGATIVE 07/02/2017 1825   KETONESUR NEGATIVE 07/02/2017 1825   PROTEINUR 30 (A) 07/02/2017 1825   UROBILINOGEN 0.2 08/17/2015 0109   NITRITE NEGATIVE 07/02/2017 1825   LEUKOCYTESUR NEGATIVE 07/02/2017 1825   Sepsis Labs Invalid input(s): PROCALCITONIN,  WBC,  LACTICIDVEN Microbiology  Recent Results (from the past 240 hour(s))  Respiratory Panel by PCR     Status: None   Collection Time: 01/03/19  9:55 PM  Result Value Ref Range Status   Adenovirus NOT DETECTED NOT DETECTED Final   Coronavirus 229E NOT DETECTED NOT DETECTED Final   Coronavirus HKU1 NOT DETECTED NOT DETECTED Final   Coronavirus NL63 NOT DETECTED NOT DETECTED  Final   Coronavirus OC43 NOT DETECTED NOT DETECTED Final   Metapneumovirus NOT DETECTED NOT DETECTED Final   Rhinovirus / Enterovirus NOT DETECTED NOT DETECTED Final   Influenza A NOT DETECTED NOT DETECTED Final   Influenza B NOT DETECTED NOT DETECTED Final   Parainfluenza Virus 1 NOT DETECTED NOT DETECTED Final   Parainfluenza Virus 2 NOT DETECTED NOT DETECTED Final   Parainfluenza Virus 3 NOT DETECTED NOT DETECTED Final   Parainfluenza Virus 4 NOT DETECTED NOT DETECTED Final   Respiratory Syncytial Virus NOT DETECTED NOT DETECTED Final   Bordetella pertussis NOT DETECTED NOT DETECTED Final   Chlamydophila pneumoniae NOT DETECTED NOT DETECTED Final   Mycoplasma pneumoniae NOT DETECTED NOT DETECTED Final    Comment: Performed at [facility], [address] [place], [place] [address]    Please note: You were cared for by a hospitalist during your hospital stay. Once you are discharged, your primary care physician will handle any further medical issues. Please note that NO REFILLS for any discharge medications will be authorized once you are discharged, as it is imperative that you return to your primary care physician (or establish a relationship with a primary care physician if you do not have one) for your post hospital discharge needs so that they can reassess your need for medications and monitor your lab values.    Time coordinating discharge: 40 minutes  SIGNED:   Shelly Coss, MD  [facility] 01/11/2019, 12:24 PM Pager [phone]  If 7PM-7AM, please contact night-coverage [email or web address] Password [email or web address]

## 2019-01-11 NOTE — Care Management Note (Signed)
Case Management Note Elizabeth Gibbons RN, BSN Transitions of Care Unit 4E- RN Case Manager [phone]  Patient Details  Name: Elizabeth Thompson MRN: [id number] Date of Birth: Mar 18, 1951  Subjective/Objective:   Pt admitted with acute on chronic respiratory failure, hx of pulmonary HTN, COPD  Action/Plan: PTA pt lived at home with niece. CM spoke with pt and niece at bedside for transition of care needs. Per conversation niece states she would like to look at [facility] options for a few weeks of rehab prior to return home. Pt has been at [facility] in past however niece would prefer a closer SNF if possible. Will have CSW consult for possible placement options. Also discussed option of return home. Pt currently has home 02 with [facility] baseline was 3L with tank capable of going to 5L- niece asking about need for higher capacity tank and HF- will have to see if pt qualifying for this and if so will need new home 02 orders and will update [facility] for new home 02 needs. Pt has rollator, BSC, and shower chair at home, requesting a hospital bed if returns home. List provided to niece for [facility] agencies Per CMS guidelines from medicare.gov website with star ratings (copy placed in shadow chart) to review if choice if made to return home. CM f/u with pt and niece for transition plan STSNF vs Home with [place] and DME needs.   Expected Discharge Date:  01/11/19               Expected Discharge Plan:  [facility]  In-House Referral:  Clinical Social Work, [facility]  Discharge planning Services  CM Consult  Post Acute Care Choice:  Durable Medical Equipment, Home Health Choice offered to:  Patient, Elizabeth Thompson  DME Arranged:  Hospital bed DME Agency:  [place]:  RN, Disease Management, PT [place] Agency:  [facility]  Status of Service:  Completed, signed off  If discussed at [employer] of Stay Meetings, dates discussed:     Discharge Disposition: Home/home health   Additional Comments:  01/11/19- 1130- Elizabeth Gibbons RN, CM- spoke with Caryl Pina at [facility]- confirmed that they can supply HF Agency to pt- will have niece call [facility] on return home so that [place] can be delivered to home, spoke with niece at the bedside to let her know about HFNC with [facility]- she will f/u with phone call when she get's pt home, she also confirms pt has nebulizer machine at home and she gets meds from pharmacy. She also states she has spare 02 tank in car for transport, no further needs at this time. Pt stable for transition home. Tiffany with [facility] has confirmed referral has been accepted for HHRN/PT. Jermaine with [facility] working on hospital bed from home- Niece to let [facility] know when she is ready for delivery.   01/10/19- 1600- [place] Jakubiak RN, CM- pt progressing well with PT- walked 380 feet today. F/u done with pt and niece regarding transition needs- pt unlikely to be approved for STSNF by insurance with current mobility level, discussed this with pt and niece who both state that they want to move forward with return home and have [facility] services- choice for [facility] agency will be [facility]. DME need- hospital bed- niece states they will not need delivery of bed prior to discharge- plan to transport pt home via private car. Pt now on 3L - will not need to  change out concentrator at home. Spoke with Caryl Pina at [facility] who states that [facility] can provide HF [place] for home. Call made to [facility] for hospital bed- they will f/u with niece for home delivery. CM will f/u in am for final transition needs- per MD will plan on d/c on 1/29. Referral for HHRN/PT called to [place] with [facility], also called [place] with [facility] for referral.   Dawayne Patricia, RN 01/11/2019, 12:29 PM

## 2019-01-16 ENCOUNTER — Inpatient Hospital Stay: Payer: Self-pay | Admitting: Internal Medicine

## 2019-01-20 DIAGNOSIS — J441 Chronic obstructive pulmonary disease with (acute) exacerbation: Secondary | ICD-10-CM | POA: Diagnosis not present

## 2019-01-23 ENCOUNTER — Encounter: Payer: Self-pay | Admitting: Internal Medicine

## 2019-01-23 ENCOUNTER — Ambulatory Visit (INDEPENDENT_AMBULATORY_CARE_PROVIDER_SITE_OTHER): Payer: Medicare Other | Admitting: Internal Medicine

## 2019-01-23 DIAGNOSIS — J449 Chronic obstructive pulmonary disease, unspecified: Secondary | ICD-10-CM

## 2019-01-23 DIAGNOSIS — I272 Pulmonary hypertension, unspecified: Secondary | ICD-10-CM | POA: Diagnosis not present

## 2019-01-23 DIAGNOSIS — J9611 Chronic respiratory failure with hypoxia: Secondary | ICD-10-CM | POA: Diagnosis not present

## 2019-01-23 MED ORDER — BUDESONIDE-FORMOTEROL FUMARATE 160-4.5 MCG/ACT IN AERO: 2.0000 | INHALATION_SPRAY | Freq: Two times a day (BID) | RESPIRATORY_TRACT | 0 refills | Status: AC

## 2019-01-23 NOTE — Progress Notes (Signed)
Subjective:    Elizabeth Thompson ID: Elizabeth Thompson, Thompson    DOB: June 13, 1951,    MRN: [id number]    Brief Elizabeth Thompson profile:  80  yobf quit smoking aug 2016 with onset of wheezing sob while in rehab p back surgery and some better while in rehab but generally worse since at home since Nov 8th 2016  >  On multiple meds no better > referred to pulmonary clinic 12/05/2015 by Barbee Shropshire with multiple nl pfts c/w GOLD 0 copd.   History of Present Illness  12/05/2015 1st [facility] office visit/ Kathlynn Swofford   Chief Complaint  Elizabeth Thompson presents with  . Pulmonary Consult    Referred by Everardo Beals. Pt c/o SOB and low o2 sats for the past 3 wks.    doe = MMRC1 = can walk nl pace, flat grade, can't hurry or go uphills or steps s sob  But this is worse since stopped smoking assoc with dry daytime cough and sense of pnds.  Was apparently not needing any resp meds while smoking now no response to multiple inhalers/ nebs  rec Stop lisinopril  Start avapro (ibesartan) 300 mg one daily - ok to break in half if too strong GERD diet      05/04/2017  Extended f/u ov/Reba Hulett re: re-establish care/ consultation per Dr Benna Dunks for sob/ hypoxemia with nl spirometry  Chief Complaint  Elizabeth Thompson presents with  . Pulmonary Consult    Referred by Dr Benna Dunks for f/u on COPD. Elizabeth Thompson reports no changes in Elizabeth Thompson breathing since Elizabeth Thompson was here last in Feb 2017.   wearing 02 3lpm but with ex go to 4lpm  And rides bike up to 38mn  Cannot walk  50 ft and has to stop even on 4lpm  Prior to back surgery getting around pretty well  around 2016 @ 25 lb less than now  Sleeps on side with 3 pillows Symbicort/ combivent and neb at least 4 h  prior to OV  For Wheeze when walk not really helping and way over using saba rec Ok 3lpm except with exertion adjust the flow to keep saturation at or above 90%  Plan A = Automatic = symbicort 160 Take 2 puffs first thing in am and then another 2 puffs about 12 hours later.  Plan B =  Backup Only use your combivent  as a rescue medication   Plan C = Crisis - only use your albuterol nebulizer if you first try Plan B and it fails to help > ok to use the nebulizer up to every 4 hours but if start needing it regularly call for immediate appointment Please remember to go to the lab and x-ray department downstairs in the basement  for your tests - we will call you with the results when they are available.  See Tammy NP w/in 2 weeks with all your medications,  Add:  Echo with bubble study ordered    Admit date: 01/03/2019 Discharge date: 01/11/2019  Admitted From: Home Disposition:  Home  Discharge Condition:Stable CODE STATUS:FULL Diet recommendation: Heart Healthy  Brief/Interim Summary:  C76W Elizabeth Thompson, with past medical history significant forchronic hypoxic respiratory failure on 5 L of oxygen at home, hypertension, hyperlipidemia, COPD, severe pulmonary hypertension, diabetes mellitus type 2, chronic kidney disease stage IV andSchizo-affectivedisorder. Elizabeth Thompson presented to the hospital withacute on chronic dyspnea. Apparently, Elizabeth Thompson has had worsening of shortness of breath for about 3 weeksprior to presentation, with associated cough thatwas intermittently productive of greenish sputum. No  fever or chills. No sick contacts. On presentation to the hospital, Elizabeth Thompson was tachypneic, with increased work of breathing and oxygen saturations in the low 60s on Elizabeth Thompson baseline supplemental oxygenof 5 Lper minute vianasal cannula. On a nonrebreather at 12 L, Elizabeth Thompson maintained sats in the 90s. On presentation to the ER, Elizabeth Thompson was treated with Solu-Medrol 125 mg IV x1, albuterol 2.5 mg neb, Rocephin and azithromycin to empirically cover CAP, and empiric heparin drip to cover potential PE. Elizabeth Thompson was admitted and worked up extensively. Pulmonary team was consulted to assist with Elizabeth Thompson's management. VQ scan was reported as very low  probability for pulmonary embolism. Heparin drip was discontinued afterwards. Pneumonia was deemed less likely, and antibiotics discontinued. Echocardiogram revealed diastolic dysfunction. Elizabeth Thompson was initially managed with IV diuretics for possible acute on chronic diastolic CHF, and has been transitioned back to oral Lasix. To need to monitor renal function and electrolytes. There were initial concerns for possible OSA, however, ABG done revealed pH of 7.37, PCO2 of 45.5 and PO2 of 73.8, and this was done on 50% FiO2. The Elizabeth Thompson's oxygen requirement is down to 3 L/min via nasal cannula.  Currently Elizabeth Thompson respiratory status is stable and is on baseline.Elizabeth Thompson is stable for discharge.  Elizabeth Thompson needs to follow-up with Elizabeth Thompson pulmonologist as an outpatient.  Following problems were addressed during Elizabeth Thompson  hospitalization: Acute on chronic hypoxic resp failure:  -Elizabeth Thompson hasseverechronic pulmonary disease (severe pulmonary hypertension and query COPD). -Lung examination-not indicative of COPD exacerbation -Pulmonary was following -VQ scan is reported as very low probability for PE. -Echocardiogram reported diastolic dysfunction. -ABG done during the hospital stay is as documented above. -Continue oral prednisone79m p.o. once daily for next 5 days on discharge. -Continue bronchodilators at home -Supplemental oxygen requirement is down to 3 L/min via nasal cannula. Elizabeth Thompson's baseline is 5 L/min via nasal cannula at home (prior to admission) -Elizabeth Thompson is medically stable for discharge. -Long-term prognosis is guarded.  CKD IV: This is at baseline. Continue to monitor closely.  HTN -Blood pressure is optimized.   HLD -cont statin  DM2 -carb controlled diet -resume home meds  Pulmonary HTN -ContinueRevatio and Macitentan (Opsumit) -Pulmonary was following -Elizabeth Thompson follows with Dr. WMelvyn Novasas an outpatient.  Follow-up with him -Echo report is noted.  Possible acute on chronic  diastolic CHF: -Initially on IV Lasix 80 mg twice daily. -Transitionedback to oral Lasix.Now on 80 mg daily. Elizabeth Thompson was on 40 mg daily at home. -Echo report is noted (55 to 610% grade 1 diastolic dysfunction, mild LVH and moderately reduced right ventricular systolic function  Discharge Diagnoses:  Principal Problem:   Acute on chronic respiratory failure (HCC)   Schizoaffective disorder (HCC)   Diabetes mellitus type 2, controlled, without complications ([place]   HLD (hyperlipidemia)   Essential hypertension   Dyspnea   Hypertension   COPD (chronic obstructive pulmonary disease) ([facility]   Diabetes mellitus without complication ([facility]   Pulmonary hypertension (HCC)   CKD (chronic kidney disease) stage 3, GFR 30-59 ml/min (HCC)   AKI (acute kidney injury) ([facility]   Acute respiratory failure (HCC)   OSA (obstructive sleep apnea)   Goals of care, counseling/discussion   Palliative care by specialist    01/23/2019  Extended post hosp f/u ov/Rivaan Kendall re: completed prednisone 01/18/19 Chief Complaint  Elizabeth Thompson presents with  . Hospitalization Follow-up    Breathing has improved some.   Dyspnea:  Did 200 ft prior to discharge on 3.5 lpm until 01/21/19  Decreased sats with more thick mucus and worse  sats  Cough: clear thick  mucus worse in ams  Sleeping: flat bed / 3 pillows SABA use:  02: 3.5- 5lpm    No obvious day to day or daytime variability or assoc purulent sputum or mucus plugs or hemoptysis or cp or chest tightness, subjective wheeze or overt sinus or hb symptoms.     Also denies any obvious fluctuation of symptoms with weather or environmental changes or other aggravating or alleviating factors except as outlined above   No unusual exposure hx or h/o childhood pna/ asthma or knowledge of premature birth.  Current Allergies, Complete Past Medical History, Past Surgical History, Family History, and Social History were reviewed in [employer] record.  ROS  The  following are not active complaints unless bolded Hoarseness, sore throat, dysphagia, dental problems, itching, sneezing,  nasal congestion or discharge of excess mucus or purulent secretions, ear ache,   fever, chills, sweats, unintended wt loss or wt gain, classically pleuritic or exertional cp,  orthopnea pnd or arm/hand swelling  or leg swelling, presyncope, palpitations, abdominal pain, anorexia, nausea, vomiting, diarrhea  or change in bowel habits or change in bladder habits, change in stools or change in urine, dysuria, hematuria,  rash, arthralgias, visual complaints, headache, numbness, weakness or ataxia or problems with walking or coordination,  change in mood or  memory.        Current Meds  Medication Sig  . acetaminophen (TYLENOL) 650 MG CR tablet Take 1,300 mg by mouth every 12 (twelve) hours as needed for pain.   [name] albuterol (PROAIR HFA) 108 (90 Base) MCG/ACT inhaler Inhale 2 puffs into the lungs every 6 (six) hours as needed for wheezing or shortness of breath.  [name] albuterol (PROVENTIL) (2.5 MG/3ML) 0.083% nebulizer solution Take 2.5 mg by nebulization every 4 (four) hours as needed for wheezing or shortness of breath. PLAN C  . amLODipine (NORVASC) 10 MG tablet Take 1 tablet (10 mg total) by mouth daily.  . ARIPiprazole (ABILIFY) 20 MG tablet Take 1 tablet (20 mg total) by mouth at bedtime.  . ASPERCREME LIDOCAINE EX Apply 1 application topically 2 (two) times daily as needed (back pain).  [name] aspirin EC 81 MG EC tablet Take 1 tablet (81 mg total) by mouth daily.  [name] atorvastatin (LIPITOR) 20 MG tablet Take 20 mg by mouth daily after supper.  . bisacodyl (DULCOLAX) 10 MG suppository Place 1 suppository (10 mg total) rectally daily as needed for moderate constipation.  . budesonide-formoterol (SYMBICORT) 160-4.5 MCG/ACT inhaler Inhale 2 puffs into the lungs 2 (two) times daily.  [name] donepezil (ARICEPT) 5 MG tablet Take 1 tablet (5 mg total) by mouth at bedtime.  . dorzolamide-timolol  (COSOPT) 22.3-6.8 MG/ML ophthalmic solution Place 1 drop into both eyes 2 (two) times daily.  . feeding supplement (BOOST / RESOURCE BREEZE) LIQD Take 1 Container by mouth 3 (three) times daily between meals.  . ferrous sulfate 325 (65 FE) MG tablet Take 325 mg by mouth daily after supper.  . furosemide (LASIX) 80 MG tablet Take 1 tablet (80 mg total) by mouth daily.  [name] glimepiride (AMARYL) 4 MG tablet Take 0.5 tablets (2 mg total) by mouth daily with breakfast.  . glucose blood (ACCU-CHEK AVIVA) test strip 1 each by Other route 2 (two) times daily.  . Ipratropium-Albuterol (COMBIVENT RESPIMAT) 20-100 MCG/ACT AERS respimat Inhale 1-2 puffs into the lungs every 4 (four) hours as needed for wheezing. PLAN B   . lamoTRIgine (LAMICTAL) 100 MG tablet Take 1 tablet (  100 mg total) by mouth every evening. (Elizabeth Thompson taking differently: Take 100 mg by mouth daily after supper. )  . latanoprost (XALATAN) 0.005 % ophthalmic solution Place 1 drop into both eyes at bedtime.  . Magnesium 250 MG TABS Take 250 mg by mouth See admin instructions. Take 1 tablet (250 mg) by mouth every other day after supper  . Menthol, Topical Analgesic, (BIOFREEZE EX) Apply 1 application topically 2 (two) times daily as needed (back pain).  . metFORMIN (GLUCOPHAGE) 1000 MG tablet Take 1 tablet (1,000 mg total) by mouth daily with breakfast. (Elizabeth Thompson taking differently: Take 1,000 mg by mouth daily with breakfast. If not eating well, give 500 mg twice daily instead of 1064m daily)  . OPSUMIT 10 MG tablet Take 10 mg by mouth daily.   . OXYGEN Inhale 5 L into the lungs See admin instructions.   . Phenylephrine-APAP-Guaifenesin (MUCINEX SINUS-MAX) 10-650-400 MG/20ML LIQD Take 20 mLs by mouth 2 (two) times daily.  . polyethylene glycol (MIRALAX / GLYCOLAX) packet Take 17 g by mouth daily. (Elizabeth Thompson taking differently: Take 17 g by mouth daily as needed (for constipation). Mix in 4-8 oz liquid and drink)  . prednisoLONE 5 MG TABS tablet Take  30 mg daily for 2 days then 20 mg daily for 2 days then 10 mg daily for 3 days then stop  . Respiratory Therapy Supplies (FLUTTER) DEVI Use as directed.  . Selenium Sulf-Pyrithione-Urea 2.25 % SHAM Apply 1 application topically See admin instructions. Apply topically to scalp for dermatitis once every 14 days  . senna (SENOKOT) 8.6 MG tablet Take 1 tablet by mouth daily as needed for constipation.   . sildenafil (REVATIO) 20 MG tablet Take 1 tablet (20 mg total) by mouth 3 (three) times daily.                   Objective:   Physical Exam  amb obese bf nad    01/23/2019      191   05/04/17 212 lb (96.2 kg)  03/10/17 213 lb (96.6 kg)  02/06/17 232 lb 9.6 oz (105.5 kg)     Vital signs reviewed - Note on arrival 02 sats  94% on 6lpm  Continuous   HEENT: nl dentition, turbinates bilaterally, and oropharynx. Nl external ear canals without cough reflex   NECK :  without JVD/Nodes/TM/ nl carotid upstrokes bilaterally   LUNGS: no acc muscle use,  Nl contour chest which is clear to A and P bilaterally without cough on insp or exp maneuvers   CV:  RRR  no s3 or murmur - Pos  increase in P2, and no significant edema.  ABD:  Obese/ soft and nontender with nl inspiratory excursion in the supine position. No bruits or organomegaly appreciated, bowel sounds nl  MS:  Nl gait/ ext warm without deformities, calf tenderness, cyanosis or clubbing No obvious joint restrictions   SKIN: warm and dry without lesions    NEURO:  alert, approp, nl sensorium with  no motor or cerebellar deficits apparent.            I personally reviewed images and agree with radiology impression as follows:  CXR:   01/05/19 Chronic lung changes without clear evidence of an acute process.

## 2019-01-23 NOTE — Patient Instructions (Addendum)
Plan A = Automatic = Symbicort 160  Take 2 puffs first thing in am and then another 2 puffs about 12 hours later.   Work on inhaler technique:  relax and gently blow all the way out then take a nice smooth deep breath back in, triggering the inhaler at same time you start breathing in.  Hold for up to 5 seconds if you can. Blow out thru nose. Rinse and gargle with water when done   Plan B = Backup Only use your combivent  as a rescue medication to be used if you can't catch your breath by resting or doing a relaxed purse lip breathing pattern.  - The less you use it, the better it will work when you need it. - Ok to use the inhaler up to 1 puffs  every 4 hours if you must but call for appointment if use goes up over your usual need - Don't leave home without it !!  (think of it like the spare tire for your car)   Plan C = Crisis - only use your albuterol nebulizer if you first try Plan B and it fails to help > ok to use the nebulizer up to every 4 hours but if start needing it regularly call for immediate appointment   Adjust to keep the 02 above 90% at all times   Please schedule a follow up office visit in 4 weeks, sooner if needed

## 2019-01-24 ENCOUNTER — Emergency Department (HOSPITAL_COMMUNITY)
Admission: EM | Admit: 2019-01-24 | Discharge: 2019-01-25 | Disposition: A | Discharge status: Home / Self Care | Payer: Medicare Other | Attending: Emergency Medicine | Admitting: Emergency Medicine

## 2019-01-24 ENCOUNTER — Encounter: Payer: Self-pay | Admitting: Internal Medicine

## 2019-01-24 ENCOUNTER — Telehealth: Payer: Self-pay | Admitting: Internal Medicine

## 2019-01-24 ENCOUNTER — Encounter (HOSPITAL_COMMUNITY): Payer: Self-pay | Admitting: Emergency Medicine

## 2019-01-24 ENCOUNTER — Emergency Department (HOSPITAL_COMMUNITY): Payer: Medicare Other

## 2019-01-24 DIAGNOSIS — J9621 Acute and chronic respiratory failure with hypoxia: Secondary | ICD-10-CM | POA: Diagnosis not present

## 2019-01-24 DIAGNOSIS — K219 Gastro-esophageal reflux disease without esophagitis: Secondary | ICD-10-CM | POA: Diagnosis not present

## 2019-01-24 DIAGNOSIS — Z87891 Personal history of nicotine dependence: Secondary | ICD-10-CM | POA: Insufficient documentation

## 2019-01-24 DIAGNOSIS — J9611 Chronic respiratory failure with hypoxia: Secondary | ICD-10-CM

## 2019-01-24 DIAGNOSIS — I4581 Long QT syndrome: Secondary | ICD-10-CM | POA: Diagnosis not present

## 2019-01-24 DIAGNOSIS — Z7982 Long term (current) use of aspirin: Secondary | ICD-10-CM | POA: Insufficient documentation

## 2019-01-24 DIAGNOSIS — N289 Disorder of kidney and ureter, unspecified: Secondary | ICD-10-CM

## 2019-01-24 DIAGNOSIS — J449 Chronic obstructive pulmonary disease, unspecified: Secondary | ICD-10-CM | POA: Insufficient documentation

## 2019-01-24 DIAGNOSIS — Z9181 History of falling: Secondary | ICD-10-CM | POA: Diagnosis not present

## 2019-01-24 DIAGNOSIS — E1122 Type 2 diabetes mellitus with diabetic chronic kidney disease: Secondary | ICD-10-CM | POA: Insufficient documentation

## 2019-01-24 DIAGNOSIS — R0902 Hypoxemia: Secondary | ICD-10-CM | POA: Diagnosis not present

## 2019-01-24 DIAGNOSIS — H409 Unspecified glaucoma: Secondary | ICD-10-CM | POA: Diagnosis not present

## 2019-01-24 DIAGNOSIS — I272 Pulmonary hypertension, unspecified: Secondary | ICD-10-CM | POA: Diagnosis not present

## 2019-01-24 DIAGNOSIS — E785 Hyperlipidemia, unspecified: Secondary | ICD-10-CM | POA: Diagnosis not present

## 2019-01-24 DIAGNOSIS — Z79899 Other long term (current) drug therapy: Secondary | ICD-10-CM | POA: Diagnosis not present

## 2019-01-24 DIAGNOSIS — N184 Chronic kidney disease, stage 4 (severe): Secondary | ICD-10-CM | POA: Diagnosis not present

## 2019-01-24 DIAGNOSIS — J811 Chronic pulmonary edema: Secondary | ICD-10-CM | POA: Diagnosis not present

## 2019-01-24 DIAGNOSIS — G4733 Obstructive sleep apnea (adult) (pediatric): Secondary | ICD-10-CM | POA: Diagnosis not present

## 2019-01-24 DIAGNOSIS — Z85828 Personal history of other malignant neoplasm of skin: Secondary | ICD-10-CM | POA: Diagnosis not present

## 2019-01-24 DIAGNOSIS — R911 Solitary pulmonary nodule: Secondary | ICD-10-CM | POA: Diagnosis not present

## 2019-01-24 DIAGNOSIS — N183 Chronic kidney disease, stage 3 (moderate): Secondary | ICD-10-CM | POA: Insufficient documentation

## 2019-01-24 DIAGNOSIS — I129 Hypertensive chronic kidney disease with stage 1 through stage 4 chronic kidney disease, or unspecified chronic kidney disease: Secondary | ICD-10-CM | POA: Insufficient documentation

## 2019-01-24 DIAGNOSIS — G4700 Insomnia, unspecified: Secondary | ICD-10-CM | POA: Diagnosis not present

## 2019-01-24 DIAGNOSIS — J810 Acute pulmonary edema: Secondary | ICD-10-CM | POA: Diagnosis not present

## 2019-01-24 DIAGNOSIS — Z9981 Dependence on supplemental oxygen: Secondary | ICD-10-CM | POA: Diagnosis not present

## 2019-01-24 DIAGNOSIS — Z86010 Personal history of colonic polyps: Secondary | ICD-10-CM | POA: Diagnosis not present

## 2019-01-24 DIAGNOSIS — R0602 Shortness of breath: Secondary | ICD-10-CM | POA: Insufficient documentation

## 2019-01-24 DIAGNOSIS — R799 Abnormal finding of blood chemistry, unspecified: Secondary | ICD-10-CM | POA: Diagnosis present

## 2019-01-24 DIAGNOSIS — Z7984 Long term (current) use of oral hypoglycemic drugs: Secondary | ICD-10-CM | POA: Diagnosis not present

## 2019-01-24 DIAGNOSIS — G510 Bell's palsy: Secondary | ICD-10-CM | POA: Diagnosis not present

## 2019-01-24 LAB — CBC
HCT: 33.2 % — ABNORMAL LOW (ref 36.0–46.0)
HEMOGLOBIN: 9.9 g/dL — AB (ref 12.0–15.0)
MCH: 24.8 pg — ABNORMAL LOW (ref 26.0–34.0)
MCHC: 29.8 g/dL — AB (ref 30.0–36.0)
MCV: 83 fL (ref 80.0–100.0)
Platelets: 409 10*3/uL — ABNORMAL HIGH (ref 150–400)
RBC: 4 MIL/uL (ref 3.87–5.11)
RDW: 17.2 % — ABNORMAL HIGH (ref 11.5–15.5)
WBC: 10.1 10*3/uL (ref 4.0–10.5)
nRBC: 0 % (ref 0.0–0.2)

## 2019-01-24 LAB — COMPREHENSIVE METABOLIC PANEL
ALT: 12 U/L (ref 0–44)
AST: 18 U/L (ref 15–41)
Albumin: 3.8 g/dL (ref 3.5–5.0)
Alkaline Phosphatase: 44 U/L (ref 38–126)
Anion gap: 13 (ref 5–15)
BUN: 55 mg/dL — ABNORMAL HIGH (ref 8–23)
CALCIUM: 10 mg/dL (ref 8.9–10.3)
CO2: 25 mmol/L (ref 22–32)
Chloride: 97 mmol/L — ABNORMAL LOW (ref 98–111)
Creatinine, Ser: 2.68 mg/dL — ABNORMAL HIGH (ref 0.44–1.00)
GFR calc Af Amer: 20 mL/min — ABNORMAL LOW (ref >60)
GFR calc non Af Amer: 18 mL/min — ABNORMAL LOW (ref >60)
Glucose, Bld: 178 mg/dL — ABNORMAL HIGH (ref 70–99)
Potassium: 3.7 mmol/L (ref 3.5–5.1)
Sodium: 135 mmol/L (ref 135–145)
Total Bilirubin: 0.5 mg/dL (ref 0.3–1.2)
Total Protein: 7.2 g/dL (ref 6.5–8.1)

## 2019-01-24 NOTE — Assessment & Plan Note (Signed)
02 sats at rest on 3lpm 92% 05/04/2017 but needed 6lpm with walking  - Echo 06/09/17 neg for shunt but pos for PH > [facility] 06/11/17  PCWP  29 and PA mean 63> see [place]  Critical given prob PAH that she keep sats > 90% at all times, ok to titrate

## 2019-01-24 NOTE — Assessment & Plan Note (Addendum)
Quit smoking 07/2015  PFT's  01/17/2016  FEV1 2.07 (105 % ) ratio 89  p 0 % improvement from saba with DLCO  37 % corrects to 49 % for alv volume with ERV 34   - Spirometry 05/04/2017  FEV1 1.42 (77%)  Ratio 81 with nl curvature   - Allergy profile  05/04/17 >  Eos 0.2/  IgE 7 RAST pos dust only   - 01/23/2019  After extensive coaching inhaler device,  effectiveness =    75% from a baseline of < 50%     Mild flare of cough off pred x 6 days c/w AB component though no wheeze on exam > rec continue to work on technique with symb 160 2bid and use saba approp as backup  - see avs for instructions unique to this ov  Since she has some centrilobular emphysema on CT she is best characterized as copd gold 0 as does not meet the pft criteria for it .    Note also on timolol eyedrops so if wheezing does develop needs to consider alternative

## 2019-01-24 NOTE — Telephone Encounter (Signed)
Called and left message.

## 2019-01-24 NOTE — Assessment & Plan Note (Addendum)
Onset of symptoms around 2016 p back surgery with wt gain originally assoc with "wheezing" on ACEi  Misdiagnosed as copd  -HC 07/20/17  PA mean = 53 with wedge 17 and co = 5.11 c/w  PVR  = 7 woods units - V/Q lung scan 01/04/19  Nl ventilation and no perfusion abn c/w very low prob pe     I suspect more of a WHO III pattern here but can't r/o WHO I and support continued rx per Dr Nadyne Coombes    I had an extended discussion with the patient and her caretaker  reviewing all relevant studies completed to date and  lasting 25  minutes of a 40 minute transition of care office visit  In pt not seen by me since 04/2017 and lots of data to review with her in meantime including the misdiagnosis of copd.   See device teaching which extended face to face time for this visit.  Each maintenance medication was reviewed in detail including emphasizing most importantly the difference between maintenance and prns and under what circumstances the prns are to be triggered using an action plan format that is not reflected in the computer generated alphabetically organized AVS which I have not found useful in most complex patients, especially with respiratory illnesses  Please see AVS for specific instructions unique to this visit that I personally wrote and verbalized to the the pt in detail and then reviewed with pt  by my nurse highlighting any  changes in therapy recommended at today's visit to their plan of care.

## 2019-01-24 NOTE — ED Triage Notes (Signed)
Patient here due to having increased renal labs per PCP.  Patient also having more demand of oxygen use, family increasing amount of Liters of oxygen to bring her sats up.  She has a history of COPD.

## 2019-01-25 ENCOUNTER — Emergency Department (HOSPITAL_COMMUNITY): Payer: Medicare Other

## 2019-01-25 DIAGNOSIS — J449 Chronic obstructive pulmonary disease, unspecified: Secondary | ICD-10-CM | POA: Diagnosis not present

## 2019-01-25 DIAGNOSIS — R0602 Shortness of breath: Secondary | ICD-10-CM | POA: Diagnosis not present

## 2019-01-25 LAB — TROPONIN I

## 2019-01-25 LAB — D-DIMER, QUANTITATIVE: D-Dimer, Quant: 1.19 ug/mL-FEU — ABNORMAL HIGH (ref 0.00–0.50)

## 2019-01-25 LAB — BRAIN NATRIURETIC PEPTIDE: B Natriuretic Peptide: 28.1 pg/mL (ref 0.0–100.0)

## 2019-01-25 MED ORDER — PREDNISONE 20 MG PO TABS
40.0000 mg | ORAL_TABLET | Freq: Once | ORAL | Status: AC
  Administered 2019-01-25: 40 mg via ORAL
  Filled 2019-01-25: qty 2

## 2019-01-25 MED ORDER — PREDNISONE 20 MG PO TABS: 40.0000 mg | ORAL_TABLET | Freq: Every day | ORAL | 0 refills | Status: DC

## 2019-01-25 MED ORDER — ALBUTEROL SULFATE (2.5 MG/3ML) 0.083% IN NEBU
5.0000 mg | INHALATION_SOLUTION | Freq: Once | RESPIRATORY_TRACT | Status: AC
  Administered 2019-01-25: 5 mg via RESPIRATORY_TRACT
  Filled 2019-01-25: qty 6

## 2019-01-25 MED ORDER — IPRATROPIUM BROMIDE 0.02 % IN SOLN
0.5000 mg | Freq: Once | RESPIRATORY_TRACT | Status: AC
  Administered 2019-01-25: 0.5 mg via RESPIRATORY_TRACT
  Filled 2019-01-25: qty 2.5

## 2019-01-25 MED ORDER — TECHNETIUM TC 99M DIETHYLENETRIAME-PENTAACETIC ACID
30.0000 | Freq: Once | INTRAVENOUS | Status: AC | PRN
  Administered 2019-01-25: 30 via RESPIRATORY_TRACT

## 2019-01-25 NOTE — ED Provider Notes (Signed)
8:50 AM Patient's VQ scan shows low probability for PE.  Upon returning to the room just with short bursts of activity her oxygen drops to the mid 70s.  This improves with rest back to the mid 90s.  In speaking with the patient and her family member they state that since they saw the doctor on Friday she has finished her prednisone prescription and is supposed to start on Symbicort but has not been able to pick that prescription up yet.  She is having to use her PRN nebulizers much more frequently at home and anytime she does any type of activity they have to increase her oxygen requirement to 6 L.  Patient has decreased breath sounds diffusely on exam but no specific wheezing.  She is tachypneic with some mild accessory muscle use.  She has not had any nebs or her home medications since being here.  Will give albuterol and Atrovent here and a dose of prednisone.  Feel may most likely related to weather, recently stopping her prednisone and not being able to start Symbicort yet she is having a mild flare in her lung disease.  9:43 AM Patient feels improved after neb.  She will be given a tapering course of prednisone.  She will also increase her diabetic medications if her sugar starts to elevate.  She will follow-up with pulmonology as planned.  She is to be getting a concentrator later today.   Blanchie Dessert, MD 01/28/19 [phone]

## 2019-01-25 NOTE — Telephone Encounter (Signed)
Elizabeth Thompson returning call. Cb is [phone].

## 2019-01-25 NOTE — ED Provider Notes (Signed)
[facility] EMERGENCY DEPARTMENT Provider Note   CSN: [id number] Arrival date & time: 01/24/19  2101     History   Chief Complaint Chief Complaint  Patient presents with  . Abnormal Lab    HPI Elizabeth Thompson is a 68 y.o. female.  The history is provided by the patient and a relative.  She has history of hypertension, diabetes, hyperlipidemia, COPD, bipolar disorder and was told to come to the ED because her PCP noted her renal function tests had worsened on blood draw from the office.  In addition, patient's oxygen requirement has increased.  She normally maintains adequate oxygen saturation at rest, but has noted that her her saturations will drop with even modest exertion.  She had recently been in the hospital and she had been on nasal oxygen 3.5 L/min.  However, at home tonight, oxygen saturations dropped to as low as 60%, even when on 5 L/min.  Her home oxygen concentrator will not go any higher than 5 L.  She had been in touch with her pulmonologist to get a concentrator that would go up to 10L.  She has been maintaining a steady weight.  There has been no obvious leg swelling.  She denies chest pain.  There is a mild cough.  She has recently been taken off of prednisone.  Past Medical History:  Diagnosis Date  . Acute respiratory failure ([place]) 06/09/2017  . Arthritis   . Back pain   . Bell's palsy   . Bipolar disorder ([place])   . Bronchitis   . Chronic low back pain 05/09/2015  . COPD (chronic obstructive pulmonary disease) ([place])    hyoxia during sleep- using O2 as needed  . Cough with expectoration 05/21/16   recent diagnosis of bronchitis  . Cyst of right kidney   . Diabetes mellitus without complication ([place])    type 2  . Frequency of urination   . GERD (gastroesophageal reflux disease)   . Glaucoma   . History of blood transfusion   . History of colon polyps   . History of hiatal hernia   . History of tobacco use   . Hypercalcemia   .  Hyperlipidemia   . Hypertension   . Memory disorder 09/05/2014  . On home oxygen therapy    3L/M [place] at night   . Schizo-affective psychosis ([place])   . Shortness of breath dyspnea    exertion, or with out oxygen  . Uterine fibroid     Patient Active Problem List   Diagnosis Date Noted  . Goals of care, counseling/discussion   . Palliative care by specialist   . OSA (obstructive sleep apnea)   . AKI (acute kidney injury) ([place]) 01/03/2019  . Acute respiratory failure ([place]) 01/03/2019  . Hypertension 07/02/2017  . Chronic respiratory failure with hypoxia, on home O2 therapy ([place]) 07/02/2017  . COPD (chronic obstructive pulmonary disease) ([place]) 07/02/2017  . Diabetes mellitus without complication ([place]) 13/24/4010  . Pulmonary hypertension ([place]) 07/02/2017  . Bipolar disorder ([place]) 07/02/2017  . Hyperlipidemia 07/02/2017  . Acute hyponatremia 07/02/2017  . Acute metabolic encephalopathy 27/25/3664  . CKD (chronic kidney disease) stage 3, GFR 30-59 ml/min (HCC) 07/02/2017  . Elevated troponin   . Cor pulmonale, acute ([place])   . Acute on chronic respiratory failure ([place]) 06/09/2017  . Chronic renal disease, stage III ([place]) 05/05/2017  . Chronic respiratory failure with hypoxia ([place]) 05/05/2017  . Renal cell carcinoma, left ([place]) 02/05/2017  . Dyspnea 12/28/2016  .  Depression 09/04/2016  . Renal malignant tumor ([place]) 06/26/2016  . GERD (gastroesophageal reflux disease) 03/22/2016  . Insomnia 03/22/2016  . COPD GOLD 0  12/06/2015  . Essential hypertension 12/06/2015  . COPD exacerbation ([place]) 11/20/2015  . Multiple lung nodules 11/20/2015  . Morbid obesity due to excess calories ([place]) 11/20/2015  . Hypersomnia with sleep apnea 11/20/2015  . Diabetes mellitus type 2, controlled, without complications ([place]) 37/85/8850  . HLD (hyperlipidemia) 11/06/2015  . Chronic low back pain 05/09/2015  . Gait disorder 05/09/2015  . Memory disorder 09/05/2014  . Schizoaffective disorder ([place])  08/18/2014    Past Surgical History:  Procedure Laterality Date  . ABDOMINAL HYSTERECTOMY    . APPENDECTOMY    . COLONOSCOPY W/ POLYPECTOMY    . IR GENERIC HISTORICAL  04/29/2016   IR RADIOLOGIST EVAL & MGMT 04/29/2016 Corrie Mckusick, DO [facility] INTERV RAD  . IR GENERIC HISTORICAL  10/01/2016   IR RADIOLOGIST EVAL & MGMT 10/01/2016 [facility] INTERV RAD  . IR GENERIC HISTORICAL  11/11/2016   IR RADIOLOGIST EVAL & MGMT 11/11/2016 Corrie Mckusick, DO [facility] INTERV RAD  . IR GENERIC HISTORICAL  07/16/2016   IR RADIOLOGIST EVAL & MGMT 07/16/2016 Corrie Mckusick, DO [facility] INTERV RAD  . IR RADIOLOGIST EVAL & MGMT  03/10/2017  . IR RADIOLOGIST EVAL & MGMT  05/13/2017  . IR RADIOLOGIST EVAL & MGMT  04/27/2018  . LUMBAR LAMINECTOMY/DECOMPRESSION MICRODISCECTOMY N/A 08/14/2015   Procedure: LUMBAR DECOMPRESSION MICRODISCECTOMY L3-S1;  Surgeon: Melina Schools, MD;  Location: [place];  Service: Orthopedics;  Laterality: N/A;  . RADIOFREQUENCY ABLATION Left 02/05/2017   Procedure: LEFT RENAL CRYOABLATION;  Surgeon: Corrie Mckusick, DO;  Location: [facility];  Service: Anesthesiology;  Laterality: Left;  . RIGHT HEART CATH N/A 07/20/2017   Procedure: Right Heart Cath;  Surgeon: Nigel Mormon, MD;  Location: [place];  Service: Cardiovascular;  Laterality: N/A;  . RIGHT/LEFT HEART CATH AND CORONARY ANGIOGRAPHY N/A 06/11/2017   Procedure: Right/Left Heart Cath and Coronary Angiography;  Surgeon: Dixie Dials, MD;  Location: [facility];  Service: Cardiovascular;  Laterality: N/A;  . TONSILLECTOMY AND ADENOIDECTOMY       OB History   No obstetric history on file.      Home Medications    Prior to Admission medications   Medication Sig Start Date End Date Taking? Authorizing Provider  acetaminophen (TYLENOL) 650 MG CR tablet Take 1,300 mg by mouth every 12 (twelve) hours as needed for pain.     [provider]  albuterol (PROAIR HFA) 108 (90 Base) MCG/ACT inhaler Inhale 2 puffs into the lungs  every 6 (six) hours as needed for wheezing or shortness of breath.    [provider]  albuterol (PROVENTIL) (2.5 MG/3ML) 0.083% nebulizer solution Take 2.5 mg by nebulization every 4 (four) hours as needed for wheezing or shortness of breath. PLAN C    [provider]  amLODipine (NORVASC) 10 MG tablet Take 1 tablet (10 mg total) by mouth daily. 01/12/19   Shelly Coss, MD  ARIPiprazole (ABILIFY) 20 MG tablet Take 1 tablet (20 mg total) by mouth at bedtime. 08/27/14   Kerrie Buffalo, NP  ASPERCREME LIDOCAINE EX Apply 1 application topically 2 (two) times daily as needed (back pain).    [provider]  aspirin EC 81 MG EC tablet Take 1 tablet (81 mg total) by mouth daily. 06/15/17   Regalado, Belkys A, MD  atorvastatin (LIPITOR) 20 MG tablet Take 20 mg by mouth daily after  supper.    [provider]  bisacodyl (DULCOLAX) 10 MG suppository Place 1 suppository (10 mg total) rectally daily as needed for moderate constipation. 09/08/16   Geradine Girt, DO  budesonide-formoterol (SYMBICORT) 160-4.5 MCG/ACT inhaler Inhale 2 puffs into the lungs 2 (two) times daily. 01/23/19   Tanda Rockers, MD  donepezil (ARICEPT) 5 MG tablet Take 1 tablet (5 mg total) by mouth at bedtime. 08/27/14   Kerrie Buffalo, NP  dorzolamide-timolol (COSOPT) 22.3-6.8 MG/ML ophthalmic solution Place 1 drop into both eyes 2 (two) times daily.    [provider]  feeding supplement (BOOST / RESOURCE BREEZE) LIQD Take 1 Container by mouth 3 (three) times daily between meals. 07/13/17   Mariel Aloe, MD  ferrous sulfate 325 (65 FE) MG tablet Take 325 mg by mouth daily after supper.    [provider]  furosemide (LASIX) 80 MG tablet Take 1 tablet (80 mg total) by mouth daily. 01/12/19   Shelly Coss, MD  glimepiride (AMARYL) 4 MG tablet Take 0.5 tablets (2 mg total) by mouth daily with breakfast. 06/14/17   Regalado, Belkys A, MD  glucose blood (ACCU-CHEK AVIVA) test strip 1 each  by Other route 2 (two) times daily. 08/04/16   Jearld Fenton, NP  Ipratropium-Albuterol (COMBIVENT RESPIMAT) 20-100 MCG/ACT AERS respimat Inhale 1-2 puffs into the lungs every 4 (four) hours as needed for wheezing. PLAN B     [provider]  lamoTRIgine (LAMICTAL) 100 MG tablet Take 1 tablet (100 mg total) by mouth every evening. Patient taking differently: Take 100 mg by mouth daily after supper.  08/27/14   Kerrie Buffalo, NP  latanoprost (XALATAN) 0.005 % ophthalmic solution Place 1 drop into both eyes at bedtime. 08/27/14   Kerrie Buffalo, NP  Magnesium 250 MG TABS Take 250 mg by mouth See admin instructions. Take 1 tablet (250 mg) by mouth every other day after supper    [provider]  Menthol, Topical Analgesic, (BIOFREEZE EX) Apply 1 application topically 2 (two) times daily as needed (back pain).    [provider]  metFORMIN (GLUCOPHAGE) 1000 MG tablet Take 1 tablet (1,000 mg total) by mouth daily with breakfast. Patient taking differently: Take 1,000 mg by mouth daily with breakfast. If not eating well, give 500 mg twice daily instead of 1021m daily 07/13/17   NMariel Aloe MD  OPSUMIT 10 MG tablet Take 10 mg by mouth daily.  10/17/18   [provider]  OXYGEN Inhale 5 L into the lungs See admin instructions.     [provider]  Phenylephrine-APAP-Guaifenesin (MUCINEX SINUS-MAX) 10-650-400 MG/20ML LIQD Take 20 mLs by mouth 2 (two) times daily.    [provider]  polyethylene glycol (MIRALAX / GLYCOLAX) packet Take 17 g by mouth daily. Patient taking differently: Take 17 g by mouth daily as needed (for constipation). Mix in 4-8 oz liquid and drink 09/08/16   VEulogio BearU, DO  prednisoLONE 5 MG TABS tablet Take 30 mg daily for 2 days then 20 mg daily for 2 days then 10 mg daily for 3 days then stop 01/12/19   AShelly Coss MD  Respiratory Therapy Supplies (FLUTTER) DEVI Use as directed. 05/26/17   Parrett, TFonnie Mu NP  Selenium  Sulf-Pyrithione-Urea 2.25 % SHAM Apply 1 application topically See admin instructions. Apply topically to scalp for dermatitis once every 14 days 01/25/15   [provider]  senna (SENOKOT) 8.6 MG tablet Take 1 tablet by mouth daily as needed  for constipation.     [provider]  sildenafil (REVATIO) 20 MG tablet Take 1 tablet (20 mg total) by mouth 3 (three) times daily. 06/14/17   Regalado, Cassie Freer, MD    Family History Family History  Problem Relation Age of Onset  . Dementia Mother   . Alzheimer's disease Mother   . Heart disease Mother   . Hypertension Mother   . Diabetes Mother   . Diabetes Father   . Alzheimer's disease Sister   . Heart disease Brother   . Heart attack Brother   . Bladder Cancer Neg Hx   . Kidney cancer Neg Hx   . Prostate cancer Neg Hx     Social History Social History   Tobacco Use  . Smoking status: Former Smoker    Packs/day: 1.00    Years: 28.00    Pack years: 28.00    Types: Cigarettes    Last attempt to quit: 07/15/2015    Years since quitting: 3.5  . Smokeless tobacco: Never Used  Substance Use Topics  . Alcohol use: No    Alcohol/week: 0.0 standard drinks  . Drug use: No     Allergies   Codeine; Nitrostat [nitroglycerin]; and Prednisone   Review of Systems Review of Systems  All other systems reviewed and are negative.    Physical Exam Updated Vital Signs BP 109/61 (BP Location: Right Arm)   Pulse 91   Temp 98.1 F (36.7 C) (Oral)   Resp (!) 22   SpO2 94%   Physical Exam Vitals signs and nursing note reviewed.    68 year old female, resting comfortably and in no acute distress. Vital signs are significant for mildly elevated respiratory rate. Oxygen saturation is 94%, which is normal. Head is normocephalic and atraumatic. PERRLA, EOMI. Oropharynx is clear. Neck is nontender and supple without adenopathy or JVD. Back is nontender and there is no CVA tenderness. Lungs are clear without rales, wheezes, or  rhonchi. Chest is nontender. Heart has regular rate and rhythm without murmur. Abdomen is soft, flat, nontender without masses or hepatosplenomegaly and peristalsis is normoactive. Extremities have no cyanosis or edema, full range of motion is present. Skin is warm and dry without rash. Neurologic: Mental status is normal, cranial nerves are intact, there are no motor or sensory deficits.  ED Treatments / Results  Labs (all labs ordered are listed, but only abnormal results are displayed) Labs Reviewed  CBC - Abnormal; Notable for the following components:      Result Value   Hemoglobin 9.9 (*)    HCT 33.2 (*)    MCH 24.8 (*)    MCHC 29.8 (*)    RDW 17.2 (*)    Platelets 409 (*)    All other components within normal limits  COMPREHENSIVE METABOLIC PANEL - Abnormal; Notable for the following components:   Chloride 97 (*)    Glucose, Bld 178 (*)    BUN 55 (*)    Creatinine, Ser 2.68 (*)    GFR calc non Af Amer 18 (*)    GFR calc Af Amer 20 (*)    All other components within normal limits  D-DIMER, QUANTITATIVE (NOT AT [facility]) - Abnormal; Notable for the following components:   D-Dimer, Quant 1.19 (*)    All other components within normal limits  BRAIN NATRIURETIC PEPTIDE  TROPONIN I    EKG Interpretation  Date/Time:  Wednesday January 25 2019 03:35:31 EST Ventricular Rate:  89 PR Interval:    QRS  Duration: 103 QT Interval:  414 QTC Calculation: 504 R Axis:   109 Text Interpretation:  Sinus rhythm Right axis deviation Prolonged QT interval When compared with ECG of 01/03/2019, No significant change was found Confirmed by Delora Fuel ([id number]) on 01/25/2019 3:41:38 AM       Radiology Dg Chest 2 View  Result Date: 01/24/2019 CLINICAL DATA:  Increased oxygen demand. EXAM: CHEST - 2 VIEW COMPARISON:  01/05/2019 chest radiograph FINDINGS: The lungs are well inflated. The cardiomediastinal contours are normal. There is mild interstitial pulmonary edema. There is no pleural  effusion or pneumothorax. IMPRESSION: Mild interstitial pulmonary edema. Electronically Signed   By: Ulyses Jarred M.D.   On: 01/24/2019 22:26    Procedures Procedures   Medications Ordered in ED Medications - No data to display   Initial Impression / Assessment and Plan / ED Course  I have reviewed the triage vital signs and the nursing notes.  Pertinent labs & imaging results that were available during my care of the patient were reviewed by me and considered in my medical decision making (see chart for details).  Worsening renal function.  Increased oxygen requirement and patient with COPD.  Old records are reviewed showing recent hospitalization for respiratory failure.  Creatinine today is 2.68 compared with 2.40 on hospital discharge on January 26.  Hemoglobin has actually risen to 9.9 compared with 9.1 on January 25.  Chest x-ray does show evidence of mild pulmonary edema.  Will check BNP and troponin as well as ECG.  4:53 AM ECG shows no acute changes.  BNP and troponin are normal.  Will check d-dimer to rule out pulmonary embolism.  5:41 AM D-dimer has come back elevated.  She will need to have the/Q scan to evaluate for pulmonary emboli.  Unable to do CT angiogram because of renal insufficiency.  Case is signed out to Dr. Maryan Rued.  Final Clinical Impressions(s) / ED Diagnoses   Final diagnoses:  Chronic obstructive pulmonary disease, unspecified COPD type [facility])  Renal insufficiency    ED Discharge Orders    None       Delora Fuel, MD 57/50/51 [phone]

## 2019-01-25 NOTE — Telephone Encounter (Signed)
Called Elizabeth Thompson, unable to reach. Left message to give [place] a call back.

## 2019-01-25 NOTE — ED Notes (Signed)
Removed pt's IV, per [place].

## 2019-01-25 NOTE — Telephone Encounter (Signed)
Lmom to call back. 

## 2019-01-25 NOTE — Telephone Encounter (Signed)
Called and spoke with Olin Hauser. She stated that the patient has a oxygen concentrator that only goes up to 5 liters. Olin Hauser stated that the patient has to go up to 6 liters when ambulating and sometimes 6 when resting. Olin Hauser would like a concentrator for the patient that goes up to 10. MW please advise, thank you.

## 2019-01-25 NOTE — Discharge Instructions (Signed)
Start taking your Symbicort as planned.  Take the tapering dose of prednisone to help with your breathing and continue to use your nebulizers as needed.  You may need to increase your oxygen with activity as you have been doing.  However if your shortness of breath worsens, the oxygen level is not going up, or you develop a fever or any type of confusion you need to return to the emergency room immediately.

## 2019-01-25 NOTE — ED Notes (Signed)
Patient verbalizes understanding of discharge instructions. Opportunity for questioning and answers were provided. Armband removed by staff, pt discharged from ED with family.

## 2019-01-25 NOTE — Telephone Encounter (Signed)
Elizabeth Thompson returned call, CB is [phone]

## 2019-01-25 NOTE — Telephone Encounter (Signed)
Fine to order it though the dme company may need more formal titration than what we have on record and if so may need ov with NP for qualifying study

## 2019-01-26 DIAGNOSIS — R05 Cough: Secondary | ICD-10-CM | POA: Diagnosis not present

## 2019-01-26 DIAGNOSIS — J9611 Chronic respiratory failure with hypoxia: Secondary | ICD-10-CM | POA: Diagnosis not present

## 2019-01-26 NOTE — Telephone Encounter (Signed)
Order placed to [place]. lmtcb X2 to make pamela/pt aware.

## 2019-01-26 NOTE — Telephone Encounter (Signed)
Elizabeth Thompson is returning call. Cb is [phone].

## 2019-01-26 NOTE — Telephone Encounter (Signed)
Spoke with Elizabeth Thompson, aware of recs.  Nothing further needed at this time- will close encounter.

## 2019-01-30 DIAGNOSIS — R0902 Hypoxemia: Secondary | ICD-10-CM | POA: Diagnosis not present

## 2019-01-30 DIAGNOSIS — N289 Disorder of kidney and ureter, unspecified: Secondary | ICD-10-CM | POA: Diagnosis not present

## 2019-02-01 DIAGNOSIS — Z86010 Personal history of colonic polyps: Secondary | ICD-10-CM | POA: Diagnosis not present

## 2019-02-01 DIAGNOSIS — Z9181 History of falling: Secondary | ICD-10-CM | POA: Diagnosis not present

## 2019-02-01 DIAGNOSIS — J449 Chronic obstructive pulmonary disease, unspecified: Secondary | ICD-10-CM | POA: Diagnosis not present

## 2019-02-01 DIAGNOSIS — J810 Acute pulmonary edema: Secondary | ICD-10-CM | POA: Diagnosis not present

## 2019-02-01 DIAGNOSIS — Z7984 Long term (current) use of oral hypoglycemic drugs: Secondary | ICD-10-CM | POA: Diagnosis not present

## 2019-02-01 DIAGNOSIS — E1122 Type 2 diabetes mellitus with diabetic chronic kidney disease: Secondary | ICD-10-CM | POA: Diagnosis not present

## 2019-02-01 DIAGNOSIS — N184 Chronic kidney disease, stage 4 (severe): Secondary | ICD-10-CM | POA: Diagnosis not present

## 2019-02-01 DIAGNOSIS — G4700 Insomnia, unspecified: Secondary | ICD-10-CM | POA: Diagnosis not present

## 2019-02-01 DIAGNOSIS — G4733 Obstructive sleep apnea (adult) (pediatric): Secondary | ICD-10-CM | POA: Diagnosis not present

## 2019-02-01 DIAGNOSIS — H409 Unspecified glaucoma: Secondary | ICD-10-CM | POA: Diagnosis not present

## 2019-02-01 DIAGNOSIS — I272 Pulmonary hypertension, unspecified: Secondary | ICD-10-CM | POA: Diagnosis not present

## 2019-02-01 DIAGNOSIS — I129 Hypertensive chronic kidney disease with stage 1 through stage 4 chronic kidney disease, or unspecified chronic kidney disease: Secondary | ICD-10-CM | POA: Diagnosis not present

## 2019-02-01 DIAGNOSIS — R911 Solitary pulmonary nodule: Secondary | ICD-10-CM | POA: Diagnosis not present

## 2019-02-01 DIAGNOSIS — E785 Hyperlipidemia, unspecified: Secondary | ICD-10-CM | POA: Diagnosis not present

## 2019-02-01 DIAGNOSIS — Z85828 Personal history of other malignant neoplasm of skin: Secondary | ICD-10-CM | POA: Diagnosis not present

## 2019-02-01 DIAGNOSIS — G510 Bell's palsy: Secondary | ICD-10-CM | POA: Diagnosis not present

## 2019-02-01 DIAGNOSIS — J9621 Acute and chronic respiratory failure with hypoxia: Secondary | ICD-10-CM | POA: Diagnosis not present

## 2019-02-01 DIAGNOSIS — K219 Gastro-esophageal reflux disease without esophagitis: Secondary | ICD-10-CM | POA: Diagnosis not present

## 2019-02-01 DIAGNOSIS — Z9981 Dependence on supplemental oxygen: Secondary | ICD-10-CM | POA: Diagnosis not present

## 2019-02-02 DIAGNOSIS — I129 Hypertensive chronic kidney disease with stage 1 through stage 4 chronic kidney disease, or unspecified chronic kidney disease: Secondary | ICD-10-CM | POA: Diagnosis not present

## 2019-02-02 DIAGNOSIS — J9621 Acute and chronic respiratory failure with hypoxia: Secondary | ICD-10-CM | POA: Diagnosis not present

## 2019-02-02 DIAGNOSIS — N184 Chronic kidney disease, stage 4 (severe): Secondary | ICD-10-CM | POA: Diagnosis not present

## 2019-02-02 DIAGNOSIS — G4700 Insomnia, unspecified: Secondary | ICD-10-CM | POA: Diagnosis not present

## 2019-02-02 DIAGNOSIS — E1122 Type 2 diabetes mellitus with diabetic chronic kidney disease: Secondary | ICD-10-CM | POA: Diagnosis not present

## 2019-02-02 DIAGNOSIS — G510 Bell's palsy: Secondary | ICD-10-CM | POA: Diagnosis not present

## 2019-02-02 DIAGNOSIS — J810 Acute pulmonary edema: Secondary | ICD-10-CM | POA: Diagnosis not present

## 2019-02-02 DIAGNOSIS — Z86010 Personal history of colonic polyps: Secondary | ICD-10-CM | POA: Diagnosis not present

## 2019-02-02 DIAGNOSIS — R911 Solitary pulmonary nodule: Secondary | ICD-10-CM | POA: Diagnosis not present

## 2019-02-02 DIAGNOSIS — I272 Pulmonary hypertension, unspecified: Secondary | ICD-10-CM | POA: Diagnosis not present

## 2019-02-02 DIAGNOSIS — E785 Hyperlipidemia, unspecified: Secondary | ICD-10-CM | POA: Diagnosis not present

## 2019-02-02 DIAGNOSIS — Z9981 Dependence on supplemental oxygen: Secondary | ICD-10-CM | POA: Diagnosis not present

## 2019-02-02 DIAGNOSIS — Z7984 Long term (current) use of oral hypoglycemic drugs: Secondary | ICD-10-CM | POA: Diagnosis not present

## 2019-02-02 DIAGNOSIS — H409 Unspecified glaucoma: Secondary | ICD-10-CM | POA: Diagnosis not present

## 2019-02-02 DIAGNOSIS — Z85828 Personal history of other malignant neoplasm of skin: Secondary | ICD-10-CM | POA: Diagnosis not present

## 2019-02-02 DIAGNOSIS — J449 Chronic obstructive pulmonary disease, unspecified: Secondary | ICD-10-CM | POA: Diagnosis not present

## 2019-02-02 DIAGNOSIS — G4733 Obstructive sleep apnea (adult) (pediatric): Secondary | ICD-10-CM | POA: Diagnosis not present

## 2019-02-02 DIAGNOSIS — Z9181 History of falling: Secondary | ICD-10-CM | POA: Diagnosis not present

## 2019-02-02 DIAGNOSIS — K219 Gastro-esophageal reflux disease without esophagitis: Secondary | ICD-10-CM | POA: Diagnosis not present

## 2019-02-04 DIAGNOSIS — J9621 Acute and chronic respiratory failure with hypoxia: Secondary | ICD-10-CM | POA: Diagnosis not present

## 2019-02-04 DIAGNOSIS — Z86010 Personal history of colonic polyps: Secondary | ICD-10-CM | POA: Diagnosis not present

## 2019-02-04 DIAGNOSIS — I272 Pulmonary hypertension, unspecified: Secondary | ICD-10-CM | POA: Diagnosis not present

## 2019-02-04 DIAGNOSIS — H409 Unspecified glaucoma: Secondary | ICD-10-CM | POA: Diagnosis not present

## 2019-02-04 DIAGNOSIS — K219 Gastro-esophageal reflux disease without esophagitis: Secondary | ICD-10-CM | POA: Diagnosis not present

## 2019-02-04 DIAGNOSIS — E1122 Type 2 diabetes mellitus with diabetic chronic kidney disease: Secondary | ICD-10-CM | POA: Diagnosis not present

## 2019-02-04 DIAGNOSIS — E785 Hyperlipidemia, unspecified: Secondary | ICD-10-CM | POA: Diagnosis not present

## 2019-02-04 DIAGNOSIS — N184 Chronic kidney disease, stage 4 (severe): Secondary | ICD-10-CM | POA: Diagnosis not present

## 2019-02-04 DIAGNOSIS — Z9981 Dependence on supplemental oxygen: Secondary | ICD-10-CM | POA: Diagnosis not present

## 2019-02-04 DIAGNOSIS — R911 Solitary pulmonary nodule: Secondary | ICD-10-CM | POA: Diagnosis not present

## 2019-02-04 DIAGNOSIS — G4700 Insomnia, unspecified: Secondary | ICD-10-CM | POA: Diagnosis not present

## 2019-02-04 DIAGNOSIS — G510 Bell's palsy: Secondary | ICD-10-CM | POA: Diagnosis not present

## 2019-02-04 DIAGNOSIS — I129 Hypertensive chronic kidney disease with stage 1 through stage 4 chronic kidney disease, or unspecified chronic kidney disease: Secondary | ICD-10-CM | POA: Diagnosis not present

## 2019-02-04 DIAGNOSIS — Z85828 Personal history of other malignant neoplasm of skin: Secondary | ICD-10-CM | POA: Diagnosis not present

## 2019-02-04 DIAGNOSIS — G4733 Obstructive sleep apnea (adult) (pediatric): Secondary | ICD-10-CM | POA: Diagnosis not present

## 2019-02-04 DIAGNOSIS — Z9181 History of falling: Secondary | ICD-10-CM | POA: Diagnosis not present

## 2019-02-04 DIAGNOSIS — J449 Chronic obstructive pulmonary disease, unspecified: Secondary | ICD-10-CM | POA: Diagnosis not present

## 2019-02-04 DIAGNOSIS — Z7984 Long term (current) use of oral hypoglycemic drugs: Secondary | ICD-10-CM | POA: Diagnosis not present

## 2019-02-04 DIAGNOSIS — J810 Acute pulmonary edema: Secondary | ICD-10-CM | POA: Diagnosis not present

## 2019-02-07 DIAGNOSIS — G4700 Insomnia, unspecified: Secondary | ICD-10-CM | POA: Diagnosis not present

## 2019-02-07 DIAGNOSIS — Z9181 History of falling: Secondary | ICD-10-CM | POA: Diagnosis not present

## 2019-02-07 DIAGNOSIS — E1122 Type 2 diabetes mellitus with diabetic chronic kidney disease: Secondary | ICD-10-CM | POA: Diagnosis not present

## 2019-02-07 DIAGNOSIS — J9621 Acute and chronic respiratory failure with hypoxia: Secondary | ICD-10-CM | POA: Diagnosis not present

## 2019-02-07 DIAGNOSIS — Z85828 Personal history of other malignant neoplasm of skin: Secondary | ICD-10-CM | POA: Diagnosis not present

## 2019-02-07 DIAGNOSIS — I272 Pulmonary hypertension, unspecified: Secondary | ICD-10-CM | POA: Diagnosis not present

## 2019-02-07 DIAGNOSIS — G4733 Obstructive sleep apnea (adult) (pediatric): Secondary | ICD-10-CM | POA: Diagnosis not present

## 2019-02-07 DIAGNOSIS — K219 Gastro-esophageal reflux disease without esophagitis: Secondary | ICD-10-CM | POA: Diagnosis not present

## 2019-02-07 DIAGNOSIS — Z9981 Dependence on supplemental oxygen: Secondary | ICD-10-CM | POA: Diagnosis not present

## 2019-02-07 DIAGNOSIS — I129 Hypertensive chronic kidney disease with stage 1 through stage 4 chronic kidney disease, or unspecified chronic kidney disease: Secondary | ICD-10-CM | POA: Diagnosis not present

## 2019-02-07 DIAGNOSIS — H409 Unspecified glaucoma: Secondary | ICD-10-CM | POA: Diagnosis not present

## 2019-02-07 DIAGNOSIS — G510 Bell's palsy: Secondary | ICD-10-CM | POA: Diagnosis not present

## 2019-02-07 DIAGNOSIS — Z86010 Personal history of colonic polyps: Secondary | ICD-10-CM | POA: Diagnosis not present

## 2019-02-07 DIAGNOSIS — R911 Solitary pulmonary nodule: Secondary | ICD-10-CM | POA: Diagnosis not present

## 2019-02-07 DIAGNOSIS — Z7984 Long term (current) use of oral hypoglycemic drugs: Secondary | ICD-10-CM | POA: Diagnosis not present

## 2019-02-07 DIAGNOSIS — J810 Acute pulmonary edema: Secondary | ICD-10-CM | POA: Diagnosis not present

## 2019-02-07 DIAGNOSIS — N184 Chronic kidney disease, stage 4 (severe): Secondary | ICD-10-CM | POA: Diagnosis not present

## 2019-02-07 DIAGNOSIS — J449 Chronic obstructive pulmonary disease, unspecified: Secondary | ICD-10-CM | POA: Diagnosis not present

## 2019-02-07 DIAGNOSIS — E785 Hyperlipidemia, unspecified: Secondary | ICD-10-CM | POA: Diagnosis not present

## 2019-02-08 DIAGNOSIS — I129 Hypertensive chronic kidney disease with stage 1 through stage 4 chronic kidney disease, or unspecified chronic kidney disease: Secondary | ICD-10-CM | POA: Diagnosis not present

## 2019-02-08 DIAGNOSIS — E785 Hyperlipidemia, unspecified: Secondary | ICD-10-CM | POA: Diagnosis not present

## 2019-02-08 DIAGNOSIS — Z86010 Personal history of colonic polyps: Secondary | ICD-10-CM | POA: Diagnosis not present

## 2019-02-08 DIAGNOSIS — Z9981 Dependence on supplemental oxygen: Secondary | ICD-10-CM | POA: Diagnosis not present

## 2019-02-08 DIAGNOSIS — G510 Bell's palsy: Secondary | ICD-10-CM | POA: Diagnosis not present

## 2019-02-08 DIAGNOSIS — K219 Gastro-esophageal reflux disease without esophagitis: Secondary | ICD-10-CM | POA: Diagnosis not present

## 2019-02-08 DIAGNOSIS — N184 Chronic kidney disease, stage 4 (severe): Secondary | ICD-10-CM | POA: Diagnosis not present

## 2019-02-08 DIAGNOSIS — E1122 Type 2 diabetes mellitus with diabetic chronic kidney disease: Secondary | ICD-10-CM | POA: Diagnosis not present

## 2019-02-08 DIAGNOSIS — J449 Chronic obstructive pulmonary disease, unspecified: Secondary | ICD-10-CM | POA: Diagnosis not present

## 2019-02-08 DIAGNOSIS — Z9181 History of falling: Secondary | ICD-10-CM | POA: Diagnosis not present

## 2019-02-08 DIAGNOSIS — H409 Unspecified glaucoma: Secondary | ICD-10-CM | POA: Diagnosis not present

## 2019-02-08 DIAGNOSIS — R911 Solitary pulmonary nodule: Secondary | ICD-10-CM | POA: Diagnosis not present

## 2019-02-08 DIAGNOSIS — Z85828 Personal history of other malignant neoplasm of skin: Secondary | ICD-10-CM | POA: Diagnosis not present

## 2019-02-08 DIAGNOSIS — J810 Acute pulmonary edema: Secondary | ICD-10-CM | POA: Diagnosis not present

## 2019-02-08 DIAGNOSIS — J9621 Acute and chronic respiratory failure with hypoxia: Secondary | ICD-10-CM | POA: Diagnosis not present

## 2019-02-08 DIAGNOSIS — G4700 Insomnia, unspecified: Secondary | ICD-10-CM | POA: Diagnosis not present

## 2019-02-08 DIAGNOSIS — Z7984 Long term (current) use of oral hypoglycemic drugs: Secondary | ICD-10-CM | POA: Diagnosis not present

## 2019-02-08 DIAGNOSIS — G4733 Obstructive sleep apnea (adult) (pediatric): Secondary | ICD-10-CM | POA: Diagnosis not present

## 2019-02-08 DIAGNOSIS — I272 Pulmonary hypertension, unspecified: Secondary | ICD-10-CM | POA: Diagnosis not present

## 2019-02-09 DIAGNOSIS — G4700 Insomnia, unspecified: Secondary | ICD-10-CM | POA: Diagnosis not present

## 2019-02-09 DIAGNOSIS — E119 Type 2 diabetes mellitus without complications: Secondary | ICD-10-CM | POA: Diagnosis not present

## 2019-02-09 DIAGNOSIS — Z9981 Dependence on supplemental oxygen: Secondary | ICD-10-CM | POA: Diagnosis not present

## 2019-02-09 DIAGNOSIS — G510 Bell's palsy: Secondary | ICD-10-CM | POA: Diagnosis not present

## 2019-02-09 DIAGNOSIS — E785 Hyperlipidemia, unspecified: Secondary | ICD-10-CM | POA: Diagnosis not present

## 2019-02-09 DIAGNOSIS — K219 Gastro-esophageal reflux disease without esophagitis: Secondary | ICD-10-CM | POA: Diagnosis not present

## 2019-02-09 DIAGNOSIS — R911 Solitary pulmonary nodule: Secondary | ICD-10-CM | POA: Diagnosis not present

## 2019-02-09 DIAGNOSIS — Z85828 Personal history of other malignant neoplasm of skin: Secondary | ICD-10-CM | POA: Diagnosis not present

## 2019-02-09 DIAGNOSIS — Z86010 Personal history of colonic polyps: Secondary | ICD-10-CM | POA: Diagnosis not present

## 2019-02-09 DIAGNOSIS — G4733 Obstructive sleep apnea (adult) (pediatric): Secondary | ICD-10-CM | POA: Diagnosis not present

## 2019-02-09 DIAGNOSIS — E1122 Type 2 diabetes mellitus with diabetic chronic kidney disease: Secondary | ICD-10-CM | POA: Diagnosis not present

## 2019-02-09 DIAGNOSIS — H401133 Primary open-angle glaucoma, bilateral, severe stage: Secondary | ICD-10-CM | POA: Diagnosis not present

## 2019-02-09 DIAGNOSIS — J9621 Acute and chronic respiratory failure with hypoxia: Secondary | ICD-10-CM | POA: Diagnosis not present

## 2019-02-09 DIAGNOSIS — Z7984 Long term (current) use of oral hypoglycemic drugs: Secondary | ICD-10-CM | POA: Diagnosis not present

## 2019-02-09 DIAGNOSIS — I129 Hypertensive chronic kidney disease with stage 1 through stage 4 chronic kidney disease, or unspecified chronic kidney disease: Secondary | ICD-10-CM | POA: Diagnosis not present

## 2019-02-09 DIAGNOSIS — J810 Acute pulmonary edema: Secondary | ICD-10-CM | POA: Diagnosis not present

## 2019-02-09 DIAGNOSIS — N184 Chronic kidney disease, stage 4 (severe): Secondary | ICD-10-CM | POA: Diagnosis not present

## 2019-02-09 DIAGNOSIS — H409 Unspecified glaucoma: Secondary | ICD-10-CM | POA: Diagnosis not present

## 2019-02-09 DIAGNOSIS — J449 Chronic obstructive pulmonary disease, unspecified: Secondary | ICD-10-CM | POA: Diagnosis not present

## 2019-02-09 DIAGNOSIS — Z9181 History of falling: Secondary | ICD-10-CM | POA: Diagnosis not present

## 2019-02-09 DIAGNOSIS — H25813 Combined forms of age-related cataract, bilateral: Secondary | ICD-10-CM | POA: Diagnosis not present

## 2019-02-09 DIAGNOSIS — I272 Pulmonary hypertension, unspecified: Secondary | ICD-10-CM | POA: Diagnosis not present

## 2019-02-10 DIAGNOSIS — K219 Gastro-esophageal reflux disease without esophagitis: Secondary | ICD-10-CM | POA: Diagnosis not present

## 2019-02-10 DIAGNOSIS — Z86010 Personal history of colonic polyps: Secondary | ICD-10-CM | POA: Diagnosis not present

## 2019-02-10 DIAGNOSIS — E1122 Type 2 diabetes mellitus with diabetic chronic kidney disease: Secondary | ICD-10-CM | POA: Diagnosis not present

## 2019-02-10 DIAGNOSIS — I129 Hypertensive chronic kidney disease with stage 1 through stage 4 chronic kidney disease, or unspecified chronic kidney disease: Secondary | ICD-10-CM | POA: Diagnosis not present

## 2019-02-10 DIAGNOSIS — Z85828 Personal history of other malignant neoplasm of skin: Secondary | ICD-10-CM | POA: Diagnosis not present

## 2019-02-10 DIAGNOSIS — G4700 Insomnia, unspecified: Secondary | ICD-10-CM | POA: Diagnosis not present

## 2019-02-10 DIAGNOSIS — G510 Bell's palsy: Secondary | ICD-10-CM | POA: Diagnosis not present

## 2019-02-10 DIAGNOSIS — G4733 Obstructive sleep apnea (adult) (pediatric): Secondary | ICD-10-CM | POA: Diagnosis not present

## 2019-02-10 DIAGNOSIS — Z9981 Dependence on supplemental oxygen: Secondary | ICD-10-CM | POA: Diagnosis not present

## 2019-02-10 DIAGNOSIS — Z7984 Long term (current) use of oral hypoglycemic drugs: Secondary | ICD-10-CM | POA: Diagnosis not present

## 2019-02-10 DIAGNOSIS — H409 Unspecified glaucoma: Secondary | ICD-10-CM | POA: Diagnosis not present

## 2019-02-10 DIAGNOSIS — J9621 Acute and chronic respiratory failure with hypoxia: Secondary | ICD-10-CM | POA: Diagnosis not present

## 2019-02-10 DIAGNOSIS — R911 Solitary pulmonary nodule: Secondary | ICD-10-CM | POA: Diagnosis not present

## 2019-02-10 DIAGNOSIS — J810 Acute pulmonary edema: Secondary | ICD-10-CM | POA: Diagnosis not present

## 2019-02-10 DIAGNOSIS — J449 Chronic obstructive pulmonary disease, unspecified: Secondary | ICD-10-CM | POA: Diagnosis not present

## 2019-02-10 DIAGNOSIS — N184 Chronic kidney disease, stage 4 (severe): Secondary | ICD-10-CM | POA: Diagnosis not present

## 2019-02-10 DIAGNOSIS — E785 Hyperlipidemia, unspecified: Secondary | ICD-10-CM | POA: Diagnosis not present

## 2019-02-10 DIAGNOSIS — Z9181 History of falling: Secondary | ICD-10-CM | POA: Diagnosis not present

## 2019-02-10 DIAGNOSIS — I272 Pulmonary hypertension, unspecified: Secondary | ICD-10-CM | POA: Diagnosis not present

## 2019-02-21 ENCOUNTER — Other Ambulatory Visit: Payer: Self-pay

## 2019-02-21 NOTE — Patient Outreach (Signed)
[facility]) Care Management  02/21/2019  Elizabeth Thompson Mar 10, 1951 [id number]   Medication adherence call to Elizabeth Thompson [facility] [employer] Verify spoke with patient she is due on Atorvastatin but does not have refill left a message at doctors office to send in a refill for a 90 days supply to [facility]. Elizabeth Thompson is showing past due under [place].   [place] Management Direct Dial [phone]  Fax [phone] [email or web address]

## 2019-02-23 ENCOUNTER — Telehealth: Payer: Self-pay | Admitting: Internal Medicine

## 2019-02-23 ENCOUNTER — Ambulatory Visit: Payer: Self-pay | Admitting: Primary Care

## 2019-02-23 NOTE — Telephone Encounter (Signed)
Called patient unable to reach LMTCB 

## 2019-02-23 NOTE — Telephone Encounter (Signed)
LVM for patient to return call regarding not feeling well today. X1 Will leave message in triage until we get a hold of the patient today.

## 2019-02-24 ENCOUNTER — Emergency Department (HOSPITAL_COMMUNITY): Payer: Medicare Other

## 2019-02-24 ENCOUNTER — Inpatient Hospital Stay (HOSPITAL_COMMUNITY)
Admission: EM | Admit: 2019-02-24 | Discharge: 2019-03-09 | DRG: 314 | Disposition: A | Discharge status: Hospice | Payer: Medicare Other | Attending: Internal Medicine | Admitting: Internal Medicine

## 2019-02-24 ENCOUNTER — Encounter (HOSPITAL_COMMUNITY): Payer: Self-pay

## 2019-02-24 ENCOUNTER — Other Ambulatory Visit: Payer: Self-pay

## 2019-02-24 DIAGNOSIS — Z9981 Dependence on supplemental oxygen: Secondary | ICD-10-CM

## 2019-02-24 DIAGNOSIS — Z818 Family history of other mental and behavioral disorders: Secondary | ICD-10-CM

## 2019-02-24 DIAGNOSIS — E119 Type 2 diabetes mellitus without complications: Secondary | ICD-10-CM

## 2019-02-24 DIAGNOSIS — G8929 Other chronic pain: Secondary | ICD-10-CM | POA: Diagnosis present

## 2019-02-24 DIAGNOSIS — E785 Hyperlipidemia, unspecified: Secondary | ICD-10-CM | POA: Diagnosis present

## 2019-02-24 DIAGNOSIS — Z87891 Personal history of nicotine dependence: Secondary | ICD-10-CM

## 2019-02-24 DIAGNOSIS — G4700 Insomnia, unspecified: Secondary | ICD-10-CM | POA: Diagnosis present

## 2019-02-24 DIAGNOSIS — E872 Acidosis: Secondary | ICD-10-CM | POA: Diagnosis not present

## 2019-02-24 DIAGNOSIS — J9622 Acute and chronic respiratory failure with hypercapnia: Secondary | ICD-10-CM | POA: Diagnosis not present

## 2019-02-24 DIAGNOSIS — E8770 Fluid overload, unspecified: Secondary | ICD-10-CM

## 2019-02-24 DIAGNOSIS — J449 Chronic obstructive pulmonary disease, unspecified: Secondary | ICD-10-CM | POA: Diagnosis present

## 2019-02-24 DIAGNOSIS — J9621 Acute and chronic respiratory failure with hypoxia: Secondary | ICD-10-CM | POA: Diagnosis present

## 2019-02-24 DIAGNOSIS — M1990 Unspecified osteoarthritis, unspecified site: Secondary | ICD-10-CM | POA: Diagnosis present

## 2019-02-24 DIAGNOSIS — G4733 Obstructive sleep apnea (adult) (pediatric): Secondary | ICD-10-CM | POA: Diagnosis not present

## 2019-02-24 DIAGNOSIS — E875 Hyperkalemia: Secondary | ICD-10-CM | POA: Diagnosis not present

## 2019-02-24 DIAGNOSIS — Z7982 Long term (current) use of aspirin: Secondary | ICD-10-CM

## 2019-02-24 DIAGNOSIS — I272 Pulmonary hypertension, unspecified: Secondary | ICD-10-CM | POA: Diagnosis not present

## 2019-02-24 DIAGNOSIS — Z7189 Other specified counseling: Secondary | ICD-10-CM

## 2019-02-24 DIAGNOSIS — Z23 Encounter for immunization: Secondary | ICD-10-CM | POA: Diagnosis present

## 2019-02-24 DIAGNOSIS — E871 Hypo-osmolality and hyponatremia: Secondary | ICD-10-CM | POA: Diagnosis present

## 2019-02-24 DIAGNOSIS — F0390 Unspecified dementia without behavioral disturbance: Secondary | ICD-10-CM | POA: Diagnosis present

## 2019-02-24 DIAGNOSIS — Z820 Family history of epilepsy and other diseases of the nervous system: Secondary | ICD-10-CM

## 2019-02-24 DIAGNOSIS — Z79899 Other long term (current) drug therapy: Secondary | ICD-10-CM

## 2019-02-24 DIAGNOSIS — Z7984 Long term (current) use of oral hypoglycemic drugs: Secondary | ICD-10-CM

## 2019-02-24 DIAGNOSIS — I50813 Acute on chronic right heart failure: Secondary | ICD-10-CM

## 2019-02-24 DIAGNOSIS — E1122 Type 2 diabetes mellitus with diabetic chronic kidney disease: Secondary | ICD-10-CM | POA: Diagnosis present

## 2019-02-24 DIAGNOSIS — J849 Interstitial pulmonary disease, unspecified: Secondary | ICD-10-CM | POA: Diagnosis present

## 2019-02-24 DIAGNOSIS — N184 Chronic kidney disease, stage 4 (severe): Secondary | ICD-10-CM | POA: Diagnosis present

## 2019-02-24 DIAGNOSIS — N289 Disorder of kidney and ureter, unspecified: Secondary | ICD-10-CM

## 2019-02-24 DIAGNOSIS — K219 Gastro-esophageal reflux disease without esophagitis: Secondary | ICD-10-CM | POA: Diagnosis present

## 2019-02-24 DIAGNOSIS — I5043 Acute on chronic combined systolic (congestive) and diastolic (congestive) heart failure: Secondary | ICD-10-CM | POA: Diagnosis present

## 2019-02-24 DIAGNOSIS — Z66 Do not resuscitate: Secondary | ICD-10-CM | POA: Diagnosis present

## 2019-02-24 DIAGNOSIS — I2723 Pulmonary hypertension due to lung diseases and hypoxia: Secondary | ICD-10-CM | POA: Diagnosis present

## 2019-02-24 DIAGNOSIS — J439 Emphysema, unspecified: Secondary | ICD-10-CM | POA: Diagnosis present

## 2019-02-24 DIAGNOSIS — Z90710 Acquired absence of both cervix and uterus: Secondary | ICD-10-CM

## 2019-02-24 DIAGNOSIS — H409 Unspecified glaucoma: Secondary | ICD-10-CM | POA: Diagnosis present

## 2019-02-24 DIAGNOSIS — I130 Hypertensive heart and chronic kidney disease with heart failure and stage 1 through stage 4 chronic kidney disease, or unspecified chronic kidney disease: Secondary | ICD-10-CM | POA: Diagnosis present

## 2019-02-24 DIAGNOSIS — I959 Hypotension, unspecified: Secondary | ICD-10-CM | POA: Diagnosis not present

## 2019-02-24 DIAGNOSIS — Z833 Family history of diabetes mellitus: Secondary | ICD-10-CM

## 2019-02-24 DIAGNOSIS — J9620 Acute and chronic respiratory failure, unspecified whether with hypoxia or hypercapnia: Secondary | ICD-10-CM | POA: Diagnosis present

## 2019-02-24 DIAGNOSIS — F319 Bipolar disorder, unspecified: Secondary | ICD-10-CM | POA: Diagnosis present

## 2019-02-24 DIAGNOSIS — J441 Chronic obstructive pulmonary disease with (acute) exacerbation: Secondary | ICD-10-CM | POA: Diagnosis not present

## 2019-02-24 DIAGNOSIS — Z888 Allergy status to other drugs, medicaments and biological substances status: Secondary | ICD-10-CM

## 2019-02-24 DIAGNOSIS — I2781 Cor pulmonale (chronic): Secondary | ICD-10-CM | POA: Diagnosis present

## 2019-02-24 DIAGNOSIS — M545 Low back pain: Secondary | ICD-10-CM | POA: Diagnosis present

## 2019-02-24 DIAGNOSIS — F259 Schizoaffective disorder, unspecified: Secondary | ICD-10-CM | POA: Diagnosis present

## 2019-02-24 DIAGNOSIS — Z85528 Personal history of other malignant neoplasm of kidney: Secondary | ICD-10-CM

## 2019-02-24 DIAGNOSIS — D631 Anemia in chronic kidney disease: Secondary | ICD-10-CM | POA: Diagnosis present

## 2019-02-24 DIAGNOSIS — Z6835 Body mass index (BMI) 35.0-35.9, adult: Secondary | ICD-10-CM

## 2019-02-24 DIAGNOSIS — N281 Cyst of kidney, acquired: Secondary | ICD-10-CM | POA: Diagnosis present

## 2019-02-24 DIAGNOSIS — Z8719 Personal history of other diseases of the digestive system: Secondary | ICD-10-CM

## 2019-02-24 DIAGNOSIS — Z885 Allergy status to narcotic agent status: Secondary | ICD-10-CM | POA: Diagnosis not present

## 2019-02-24 DIAGNOSIS — D509 Iron deficiency anemia, unspecified: Secondary | ICD-10-CM | POA: Diagnosis present

## 2019-02-24 DIAGNOSIS — D649 Anemia, unspecified: Secondary | ICD-10-CM | POA: Diagnosis not present

## 2019-02-24 DIAGNOSIS — N179 Acute kidney failure, unspecified: Secondary | ICD-10-CM | POA: Diagnosis present

## 2019-02-24 DIAGNOSIS — E860 Dehydration: Secondary | ICD-10-CM | POA: Diagnosis present

## 2019-02-24 DIAGNOSIS — I5082 Biventricular heart failure: Secondary | ICD-10-CM | POA: Diagnosis present

## 2019-02-24 DIAGNOSIS — Z7951 Long term (current) use of inhaled steroids: Secondary | ICD-10-CM

## 2019-02-24 DIAGNOSIS — E669 Obesity, unspecified: Secondary | ICD-10-CM | POA: Diagnosis present

## 2019-02-24 DIAGNOSIS — Z8249 Family history of ischemic heart disease and other diseases of the circulatory system: Secondary | ICD-10-CM

## 2019-02-24 DIAGNOSIS — Z515 Encounter for palliative care: Secondary | ICD-10-CM | POA: Diagnosis not present

## 2019-02-24 DIAGNOSIS — I493 Ventricular premature depolarization: Secondary | ICD-10-CM | POA: Diagnosis present

## 2019-02-24 LAB — RESPIRATORY PANEL BY PCR

## 2019-02-24 LAB — CREATININE, SERUM
Creatinine, Ser: 2.07 mg/dL — ABNORMAL HIGH (ref 0.44–1.00)
GFR calc Af Amer: 28 mL/min — ABNORMAL LOW
GFR calc non Af Amer: 24 mL/min — ABNORMAL LOW

## 2019-02-24 LAB — GLUCOSE, CAPILLARY
Glucose-Capillary: 169 mg/dL — ABNORMAL HIGH (ref 70–99)
Glucose-Capillary: 262 mg/dL — ABNORMAL HIGH (ref 70–99)

## 2019-02-24 LAB — COMPREHENSIVE METABOLIC PANEL
ALT: 19 U/L (ref 0–44)
AST: 40 U/L (ref 15–41)
Albumin: 3.9 g/dL (ref 3.5–5.0)
Alkaline Phosphatase: 53 U/L (ref 38–126)
Anion gap: 12 (ref 5–15)
BUN: 20 mg/dL (ref 8–23)
CO2: 21 mmol/L — ABNORMAL LOW (ref 22–32)
Calcium: 9.1 mg/dL (ref 8.9–10.3)
Chloride: 89 mmol/L — ABNORMAL LOW (ref 98–111)
Creatinine, Ser: 1.96 mg/dL — ABNORMAL HIGH (ref 0.44–1.00)
GFR calc Af Amer: 30 mL/min — ABNORMAL LOW (ref >60)
GFR calc non Af Amer: 26 mL/min — ABNORMAL LOW (ref >60)
Glucose, Bld: 86 mg/dL (ref 70–99)
POTASSIUM: 4.6 mmol/L (ref 3.5–5.1)
SODIUM: 122 mmol/L — AB (ref 135–145)
Total Bilirubin: 0.6 mg/dL (ref 0.3–1.2)
Total Protein: 7.1 g/dL (ref 6.5–8.1)

## 2019-02-24 LAB — CBC WITH DIFFERENTIAL/PLATELET
Abs Immature Granulocytes: 0.08 10*3/uL — ABNORMAL HIGH (ref 0.00–0.07)
BASOS ABS: 0 10*3/uL (ref 0.0–0.1)
Basophils Relative: 0 %
Eosinophils Absolute: 0.1 10*3/uL (ref 0.0–0.5)
Eosinophils Relative: 1 %
HCT: 25.4 % — ABNORMAL LOW (ref 36.0–46.0)
Hemoglobin: 7.5 g/dL — ABNORMAL LOW (ref 12.0–15.0)
IMMATURE GRANULOCYTES: 1 %
Lymphocytes Relative: 13 %
Lymphs Abs: 1.2 10*3/uL (ref 0.7–4.0)
MCH: 23.1 pg — ABNORMAL LOW (ref 26.0–34.0)
MCHC: 29.5 g/dL — ABNORMAL LOW (ref 30.0–36.0)
MCV: 78.4 fL — ABNORMAL LOW (ref 80.0–100.0)
Monocytes Absolute: 0.7 10*3/uL (ref 0.1–1.0)
Monocytes Relative: 8 %
Neutro Abs: 7.5 10*3/uL (ref 1.7–7.7)
Neutrophils Relative %: 77 %
Platelets: 556 10*3/uL — ABNORMAL HIGH (ref 150–400)
RBC: 3.24 MIL/uL — AB (ref 3.87–5.11)
RDW: 17.2 % — ABNORMAL HIGH (ref 11.5–15.5)
WBC: 9.6 10*3/uL (ref 4.0–10.5)
nRBC: 0.4 % — ABNORMAL HIGH (ref 0.0–0.2)

## 2019-02-24 LAB — POCT I-STAT EG7
Acid-base deficit: 2 mmol/L (ref 0.0–2.0)
BICARBONATE: 22.8 mmol/L (ref 20.0–28.0)
Calcium, Ion: 1.12 mmol/L — ABNORMAL LOW (ref 1.15–1.40)
HCT: 26 % — ABNORMAL LOW (ref 36.0–46.0)
Hemoglobin: 8.8 g/dL — ABNORMAL LOW (ref 12.0–15.0)
O2 Saturation: 69 %
Potassium: 4.7 mmol/L (ref 3.5–5.1)
Sodium: 120 mmol/L — ABNORMAL LOW (ref 135–145)
TCO2: 24 mmol/L (ref 22–32)
pCO2, Ven: 39.9 mmHg — ABNORMAL LOW (ref 44.0–60.0)
pH, Ven: 7.364 (ref 7.250–7.430)
pO2, Ven: 37 mmHg (ref 32.0–45.0)

## 2019-02-24 LAB — I-STAT TROPONIN, ED: Troponin i, poc: 0.01 ng/mL (ref 0.00–0.08)

## 2019-02-24 LAB — CBC
HCT: 24.5 % — ABNORMAL LOW (ref 36.0–46.0)
Hemoglobin: 7.6 g/dL — ABNORMAL LOW (ref 12.0–15.0)
MCH: 24.2 pg — ABNORMAL LOW (ref 26.0–34.0)
MCHC: 31 g/dL (ref 30.0–36.0)
MCV: 78 fL — ABNORMAL LOW (ref 80.0–100.0)
Platelets: 543 10*3/uL — ABNORMAL HIGH (ref 150–400)
RBC: 3.14 MIL/uL — ABNORMAL LOW (ref 3.87–5.11)
RDW: 17.2 % — ABNORMAL HIGH (ref 11.5–15.5)
WBC: 10.7 10*3/uL — ABNORMAL HIGH (ref 4.0–10.5)
nRBC: 0.4 % — ABNORMAL HIGH (ref 0.0–0.2)

## 2019-02-24 LAB — HEMOGLOBIN A1C
Hgb A1c MFr Bld: 6.5 % — ABNORMAL HIGH (ref 4.8–5.6)
Mean Plasma Glucose: 139.85 mg/dL

## 2019-02-24 LAB — BRAIN NATRIURETIC PEPTIDE: B Natriuretic Peptide: 371.7 pg/mL — ABNORMAL HIGH (ref 0.0–100.0)

## 2019-02-24 LAB — MRSA PCR SCREENING: MRSA by PCR: POSITIVE — AB

## 2019-02-24 LAB — INFLUENZA PANEL BY PCR (TYPE A & B)
INFLAPCR: NEGATIVE
INFLBPCR: NEGATIVE

## 2019-02-24 MED ORDER — DORZOLAMIDE HCL-TIMOLOL MAL 2-0.5 % OP SOLN
1.0000 [drp] | Freq: Two times a day (BID) | OPHTHALMIC | Status: DC
  Administered 2019-02-24 – 2019-03-09 (×26): 1 [drp] via OPHTHALMIC
  Filled 2019-02-24: qty 10

## 2019-02-24 MED ORDER — GLIMEPIRIDE 2 MG PO TABS
2.0000 mg | ORAL_TABLET | Freq: Every day | ORAL | Status: DC
  Administered 2019-02-25 – 2019-03-09 (×13): 2 mg via ORAL
  Filled 2019-02-24 (×13): qty 1

## 2019-02-24 MED ORDER — SODIUM CHLORIDE 0.9% FLUSH: 3.0000 mL | INTRAVENOUS | Status: DC | PRN

## 2019-02-24 MED ORDER — METHYLPREDNISOLONE SODIUM SUCC 125 MG IJ SOLR: 60.0000 mg | Freq: Two times a day (BID) | INTRAMUSCULAR | Status: DC

## 2019-02-24 MED ORDER — AMLODIPINE BESYLATE 10 MG PO TABS
10.0000 mg | ORAL_TABLET | Freq: Every day | ORAL | Status: DC
  Administered 2019-02-25: 10 mg via ORAL
  Filled 2019-02-24: qty 1

## 2019-02-24 MED ORDER — DONEPEZIL HCL 5 MG PO TABS
5.0000 mg | ORAL_TABLET | Freq: Every day | ORAL | Status: DC
  Administered 2019-02-24: 5 mg via ORAL
  Filled 2019-02-24: qty 1

## 2019-02-24 MED ORDER — MAGNESIUM OXIDE 400 (241.3 MG) MG PO TABS
200.0000 mg | ORAL_TABLET | ORAL | Status: DC
  Administered 2019-02-25 – 2019-03-07 (×6): 200 mg via ORAL
  Filled 2019-02-24 (×8): qty 1

## 2019-02-24 MED ORDER — MOMETASONE FURO-FORMOTEROL FUM 200-5 MCG/ACT IN AERO
2.0000 | INHALATION_SPRAY | Freq: Two times a day (BID) | RESPIRATORY_TRACT | Status: DC
  Administered 2019-02-24 – 2019-03-09 (×23): 2 via RESPIRATORY_TRACT
  Filled 2019-02-24: qty 8.8

## 2019-02-24 MED ORDER — SILDENAFIL CITRATE 20 MG PO TABS
20.0000 mg | ORAL_TABLET | Freq: Three times a day (TID) | ORAL | Status: DC
  Administered 2019-02-24 – 2019-03-09 (×39): 20 mg via ORAL
  Filled 2019-02-24 (×40): qty 1

## 2019-02-24 MED ORDER — ASPIRIN EC 81 MG PO TBEC
81.0000 mg | DELAYED_RELEASE_TABLET | Freq: Every day | ORAL | Status: DC
  Administered 2019-02-25 – 2019-03-09 (×13): 81 mg via ORAL
  Filled 2019-02-24 (×13): qty 1

## 2019-02-24 MED ORDER — ALBUTEROL SULFATE (2.5 MG/3ML) 0.083% IN NEBU
2.5000 mg | INHALATION_SOLUTION | RESPIRATORY_TRACT | Status: DC | PRN
  Administered 2019-02-25 – 2019-03-01 (×2): 2.5 mg via RESPIRATORY_TRACT
  Filled 2019-02-24 (×2): qty 3

## 2019-02-24 MED ORDER — MACITENTAN 10 MG PO TABS
10.0000 mg | ORAL_TABLET | Freq: Every day | ORAL | Status: DC
  Administered 2019-02-24 – 2019-03-09 (×14): 10 mg via ORAL
  Filled 2019-02-24 (×14): qty 1

## 2019-02-24 MED ORDER — IPRATROPIUM-ALBUTEROL 0.5-2.5 (3) MG/3ML IN SOLN
3.0000 mL | Freq: Four times a day (QID) | RESPIRATORY_TRACT | Status: DC
  Administered 2019-02-24 – 2019-02-28 (×17): 3 mL via RESPIRATORY_TRACT
  Filled 2019-02-24 (×16): qty 3

## 2019-02-24 MED ORDER — FERROUS SULFATE 325 (65 FE) MG PO TABS
325.0000 mg | ORAL_TABLET | Freq: Every day | ORAL | Status: DC
  Administered 2019-02-24 – 2019-03-08 (×13): 325 mg via ORAL
  Filled 2019-02-24 (×13): qty 1

## 2019-02-24 MED ORDER — ENOXAPARIN SODIUM 30 MG/0.3ML ~~LOC~~ SOLN
30.0000 mg | SUBCUTANEOUS | Status: DC
  Administered 2019-02-24 – 2019-03-07 (×12): 30 mg via SUBCUTANEOUS
  Filled 2019-02-24 (×13): qty 0.3

## 2019-02-24 MED ORDER — INSULIN ASPART 100 UNIT/ML ~~LOC~~ SOLN
0.0000 [IU] | Freq: Three times a day (TID) | SUBCUTANEOUS | Status: DC
  Administered 2019-02-24: 4 [IU] via SUBCUTANEOUS
  Administered 2019-02-25 – 2019-02-26 (×2): 3 [IU] via SUBCUTANEOUS
  Administered 2019-02-26 (×2): 4 [IU] via SUBCUTANEOUS
  Administered 2019-02-27: 7 [IU] via SUBCUTANEOUS
  Administered 2019-02-28: 4 [IU] via SUBCUTANEOUS
  Administered 2019-02-28: 11 [IU] via SUBCUTANEOUS
  Administered 2019-03-01 – 2019-03-02 (×3): 7 [IU] via SUBCUTANEOUS
  Administered 2019-03-02 (×2): 4 [IU] via SUBCUTANEOUS
  Administered 2019-03-03: 3 [IU] via SUBCUTANEOUS
  Administered 2019-03-03: 11 [IU] via SUBCUTANEOUS
  Administered 2019-03-03: 4 [IU] via SUBCUTANEOUS
  Administered 2019-03-04: 7 [IU] via SUBCUTANEOUS
  Administered 2019-03-04: 11 [IU] via SUBCUTANEOUS
  Administered 2019-03-05: 4 [IU] via SUBCUTANEOUS
  Administered 2019-03-05: 15 [IU] via SUBCUTANEOUS
  Administered 2019-03-06: 7 [IU] via SUBCUTANEOUS
  Administered 2019-03-06 – 2019-03-07 (×2): 4 [IU] via SUBCUTANEOUS
  Administered 2019-03-07 (×2): 3 [IU] via SUBCUTANEOUS
  Administered 2019-03-08: 7 [IU] via SUBCUTANEOUS
  Administered 2019-03-08: 4 [IU] via SUBCUTANEOUS

## 2019-02-24 MED ORDER — INSULIN ASPART 100 UNIT/ML ~~LOC~~ SOLN
0.0000 [IU] | Freq: Every day | SUBCUTANEOUS | Status: DC
  Administered 2019-02-24: 3 [IU] via SUBCUTANEOUS
  Administered 2019-03-01: 4 [IU] via SUBCUTANEOUS
  Administered 2019-03-02 – 2019-03-08 (×3): 3 [IU] via SUBCUTANEOUS

## 2019-02-24 MED ORDER — LIDOCAINE 5 % EX PTCH: 1.0000 | MEDICATED_PATCH | Freq: Two times a day (BID) | CUTANEOUS | Status: DC | PRN

## 2019-02-24 MED ORDER — MAGNESIUM SULFATE 2 GM/50ML IV SOLN
2.0000 g | Freq: Once | INTRAVENOUS | Status: AC
  Administered 2019-02-24: 2 g via INTRAVENOUS
  Filled 2019-02-24: qty 50

## 2019-02-24 MED ORDER — METFORMIN HCL 500 MG PO TABS: 1000.0000 mg | ORAL_TABLET | Freq: Every day | ORAL | Status: DC

## 2019-02-24 MED ORDER — BRIMONIDINE TARTRATE 0.2 % OP SOLN
1.0000 [drp] | Freq: Two times a day (BID) | OPHTHALMIC | Status: DC
  Administered 2019-02-24 – 2019-03-09 (×26): 1 [drp] via OPHTHALMIC
  Filled 2019-02-24: qty 5

## 2019-02-24 MED ORDER — INSULIN ASPART 100 UNIT/ML ~~LOC~~ SOLN
4.0000 [IU] | Freq: Three times a day (TID) | SUBCUTANEOUS | Status: DC
  Administered 2019-02-24 – 2019-03-08 (×26): 4 [IU] via SUBCUTANEOUS

## 2019-02-24 MED ORDER — METHYLPREDNISOLONE SODIUM SUCC 125 MG IJ SOLR
125.0000 mg | Freq: Once | INTRAMUSCULAR | Status: AC
  Administered 2019-02-24: 125 mg via INTRAVENOUS
  Filled 2019-02-24: qty 2

## 2019-02-24 MED ORDER — FUROSEMIDE 10 MG/ML IJ SOLN
80.0000 mg | Freq: Two times a day (BID) | INTRAMUSCULAR | Status: DC
  Administered 2019-02-24 – 2019-02-25 (×2): 80 mg via INTRAVENOUS
  Filled 2019-02-24 (×2): qty 8

## 2019-02-24 MED ORDER — ARIPIPRAZOLE 10 MG PO TABS
20.0000 mg | ORAL_TABLET | Freq: Every day | ORAL | Status: DC
  Administered 2019-02-24: 20 mg via ORAL
  Filled 2019-02-24: qty 2

## 2019-02-24 MED ORDER — SODIUM CHLORIDE 0.9 % IV SOLN: 250.0000 mL | INTRAVENOUS | Status: DC | PRN

## 2019-02-24 MED ORDER — MUSCLE RUB 10-15 % EX CREA
TOPICAL_CREAM | Freq: Two times a day (BID) | CUTANEOUS | Status: DC | PRN
  Filled 2019-02-24: qty 85

## 2019-02-24 MED ORDER — SENNA 8.6 MG PO TABS: 1.0000 | ORAL_TABLET | Freq: Every day | ORAL | Status: DC | PRN

## 2019-02-24 MED ORDER — LATANOPROST 0.005 % OP SOLN
1.0000 [drp] | Freq: Every day | OPHTHALMIC | Status: DC
  Administered 2019-02-24 – 2019-03-08 (×13): 1 [drp] via OPHTHALMIC
  Filled 2019-02-24: qty 2.5

## 2019-02-24 MED ORDER — LAMOTRIGINE 100 MG PO TABS
100.0000 mg | ORAL_TABLET | Freq: Every evening | ORAL | Status: DC
  Administered 2019-02-24 – 2019-03-08 (×13): 100 mg via ORAL
  Filled 2019-02-24 (×13): qty 1

## 2019-02-24 MED ORDER — FUROSEMIDE 20 MG PO TABS: 80.0000 mg | ORAL_TABLET | Freq: Every day | ORAL | Status: DC

## 2019-02-24 MED ORDER — ACETAMINOPHEN 325 MG PO TABS
650.0000 mg | ORAL_TABLET | Freq: Two times a day (BID) | ORAL | Status: DC | PRN
  Filled 2019-02-24: qty 2

## 2019-02-24 MED ORDER — SODIUM CHLORIDE 0.9% FLUSH
3.0000 mL | Freq: Two times a day (BID) | INTRAVENOUS | Status: DC
  Administered 2019-02-24 – 2019-03-09 (×24): 3 mL via INTRAVENOUS

## 2019-02-24 MED ORDER — IPRATROPIUM BROMIDE 0.02 % IN SOLN
1.0000 mg | Freq: Once | RESPIRATORY_TRACT | Status: AC
  Administered 2019-02-24: 1 mg via RESPIRATORY_TRACT
  Filled 2019-02-24: qty 5

## 2019-02-24 MED ORDER — MENTHOL (TOPICAL ANALGESIC) 4 % EX GEL: Freq: Two times a day (BID) | CUTANEOUS | Status: DC | PRN

## 2019-02-24 MED ORDER — ALBUTEROL (5 MG/ML) CONTINUOUS INHALATION SOLN
15.0000 mg/h | INHALATION_SOLUTION | Freq: Once | RESPIRATORY_TRACT | Status: AC
  Administered 2019-02-24: 15 mg/h via RESPIRATORY_TRACT
  Filled 2019-02-24: qty 20

## 2019-02-24 MED ORDER — ORAL CARE MOUTH RINSE
15.0000 mL | Freq: Two times a day (BID) | OROMUCOSAL | Status: DC
  Administered 2019-02-25 – 2019-03-09 (×21): 15 mL via OROMUCOSAL

## 2019-02-24 MED ORDER — POLYETHYLENE GLYCOL 3350 17 G PO PACK
17.0000 g | PACK | Freq: Every day | ORAL | Status: DC
  Administered 2019-02-27 – 2019-03-09 (×8): 17 g via ORAL
  Filled 2019-02-24 (×10): qty 1

## 2019-02-24 MED ORDER — BISACODYL 10 MG RE SUPP: 10.0000 mg | Freq: Every day | RECTAL | Status: DC | PRN

## 2019-02-24 MED ORDER — INFLUENZA VAC SPLIT HIGH-DOSE 0.5 ML IM SUSY
0.5000 mL | PREFILLED_SYRINGE | INTRAMUSCULAR | Status: AC
  Administered 2019-02-25: 0.5 mL via INTRAMUSCULAR
  Filled 2019-02-24: qty 0.5

## 2019-02-24 MED ORDER — ATORVASTATIN CALCIUM 10 MG PO TABS
20.0000 mg | ORAL_TABLET | Freq: Every day | ORAL | Status: DC
  Administered 2019-02-24 – 2019-03-08 (×13): 20 mg via ORAL
  Filled 2019-02-24 (×13): qty 2

## 2019-02-24 MED ORDER — POTASSIUM CHLORIDE IN NACL 20-0.9 MEQ/L-% IV SOLN
INTRAVENOUS | Status: DC
  Filled 2019-02-24: qty 1000

## 2019-02-24 NOTE — Consult Note (Addendum)
NAME:  Elizabeth Thompson, MRN:  [id number], DOB:  June 21, 1951, LOS: 0 ADMISSION DATE:  02/24/2019, CONSULTATION DATE:  3/13 REFERRING MD:  sheehan, CHIEF COMPLAINT:  Acute on chronic respiratory failure    Brief History   This is a 68 year old female patient with pulmonary hypertension and chronic hypoxic respiratory failure, presents to the emergency room with acute on chronic respiratory failure in the setting of decompensated diastolic heart failure and cor pulmonale on 3/13  History of present illness    Pleasant 68 year old female patient followed by Dr. Melvyn Novas  in the outpatient setting for chronic hypoxic respiratory failure, pulmonary hypertension   In the setting of emphysema on CT scan but normal PFTs, type 2 diabetes, chronic diastolic heart failure.  On chronic oxygen at 6 L.. Just discharged about 1 week ago following decompensated heart failure and diastolic dysfunction with volume overload in which she was treated with aggressive diuresis and oxygen.   She lives at home with her niece.  The family started to notice worsening shortness of breath about 4 to 5 days prior to presentation with increased episodes of hypoxia with saturations down in the 70s, decreased activity tolerance, intermittent essentially nonproductive cough and 3 pound weight gain.  Because of this her knees continue to titrate oxygen.  When she got up to 7 L but saturations remain low and therefore she came to the emergency room.  On evaluation chest x-ray demonstrated diffuse pulmonary edema.  In the emergency room she was given methylprednisone, supplemental oxygen, and placed on BiPAP.  She was to be admitted by the internal medicine service but critical care was consulted given her acute on chronic respiratory failure.    Past Medical History  Chronic respiratory failure, pulmonary hypertension WHO III,  In the setting of emphysema on CT scan but normal PFTs, type 2 diabetes, chronic diastolic heart failure.  On  chronic oxygen at 6 L.   Significant Hospital Events   3/13 admitted w/ diffuse pulmonary edema and acute on chronic resp failure   Consults:  3/13_ pulm   Procedures:    Significant Diagnostic Tests:  Echo 1/25: Function, EF 55 to 63% grade 1 diastolic dysfunction right ventricular systolic function moderately reduced Micro Data:    Antimicrobials:    Interim history/subjective:  Very short of breath, feels better with BiPAP  Objective   Blood pressure 127/70, pulse 99, resp. rate 14, height _0  (1.6 m), weight 87.1 kg, SpO2 95 %.    Vent Mode: BIPAP FiO2 (%):  [50 %] 50 % Set Rate:  [12 bmp] 12 bmp PEEP:  [5 cmH20] 5 cmH20  No intake or output data in the 24 hours ending 02/24/19 1427 Filed Weights   02/24/19 1155  Weight: 87.1 kg    Examination: General: Obese decompensated 69 year old female resting in bed does exhibit increased work of breathing HENT: Mucous membranes moist currently has facemask in place.  No JVD Lungs: Posterior rales some accessory use short of breath with talking Cardiovascular: Regular rate and rhythm Abdomen: Obese soft nontender Extremities:  trace lower extremity edema Neuro: Awake oriented no focal deficits GU: Due to void  Resolved Hospital Problem list     Assessment & Plan:  Acute on chronic hypoxic respiratory failure in the setting of decompensated pulmonary hypertension, cor pulmonale and diastolic dysfunction with pulmonary edema -I am concerned about underlying sleep apnea plus minus hypoventilation syndrome -No evidence of active infection Plan Admit to stepdown IV diuresis Pulse oximetry BiPAP at at bedtime  A.m. arterial blood gas Need to reevaluate for possible trilogy at discharge No role for systemic steroids, this is not COPD exacerbation Continue both revatio and OPSUMIT  Gold 0 COPD -Has history of central lobar emphysema but normal PFTs Plan Scheduled bronchodilators Wean oxygen  Chronic renal  insufficiency.  Baseline creatinine around 2.2-2.6 Plan Trend chemistry with diuresis  Hyponatremia Plan IV Lasix  DM type II Plan ssi   Best practice:  Diet: adv as tol Pain/Anxiety/Delirium protocol (if indicated): NI VAP protocol (if indicated): ni DVT prophylaxis: LMWH GI prophylaxis: PPI Glucose control: ssi Mobility: adv as tol Code Status: dnr Family Communication: niece updated Disposition:  Agree w/ SDU admit. See above plan of care  Labs   CBC: Recent Labs  Lab 02/24/19 1150 02/24/19 1201  WBC 9.6  --   NEUTROABS 7.5  --   HGB 7.5* 8.8*  HCT 25.4* 26.0*  MCV 78.4*  --   PLT 556*  --     Basic Metabolic Panel: Recent Labs  Lab 02/24/19 1150 02/24/19 1201  NA 122* 120*  K 4.6 4.7  CL 89*  --   CO2 21*  --   GLUCOSE 86  --   BUN 20  --   CREATININE 1.96*  --   CALCIUM 9.1  --    GFR: Estimated Creatinine Clearance: 28.8 mL/min (A) (by C-G formula based on SCr of 1.96 mg/dL (H)). Recent Labs  Lab 02/24/19 1150  WBC 9.6    Liver Function Tests: Recent Labs  Lab 02/24/19 1150  AST 40  ALT 19  ALKPHOS 53  BILITOT 0.6  PROT 7.1  ALBUMIN 3.9   No results for input(s): LIPASE, AMYLASE in the last 168 hours. No results for input(s): AMMONIA in the last 168 hours.  ABG    Component Value Date/Time   PHART 7.373 01/06/2019 0555   PCO2ART 45.5 01/06/2019 0555   PO2ART 73.8 (L) 01/06/2019 0555   HCO3 22.8 02/24/2019 1201   TCO2 24 02/24/2019 1201   ACIDBASEDEF 2.0 02/24/2019 1201   O2SAT 69.0 02/24/2019 1201     Coagulation Profile: No results for input(s): INR, PROTIME in the last 168 hours.  Cardiac Enzymes: No results for input(s): CKTOTAL, CKMB, CKMBINDEX, TROPONINI in the last 168 hours.  HbA1C: Hgb A1c MFr Bld  Date/Time Value Ref Range Status  02/02/2017 02:28 PM 5.4 4.8 - 5.6 % Final    Comment:    (NOTE)         Pre-diabetes: 5.7 - 6.4         Diabetes: >6.4         Glycemic control for adults with diabetes: <7.0    06/15/2016 07:57 AM 7.3 (H) 4.6 - 6.5 % Final    Comment:    Glycemic Control Guidelines for People with Diabetes:Non Diabetic:  <6%Goal of Therapy: <7%Additional Action Suggested:  >8%     CBG: No results for input(s): GLUCAP in the last 168 hours.  Review of Systems:   Review of Systems  Constitutional: Positive for malaise/fatigue. Negative for chills, diaphoresis and fever.  HENT: Negative for congestion, sinus pain and sore throat.   Eyes: Negative.   Respiratory: Positive for shortness of breath. Negative for stridor.   Cardiovascular: Positive for orthopnea and leg swelling.  Gastrointestinal: Negative.   Genitourinary: Negative.   Musculoskeletal: Negative.   Skin: Negative.   Neurological: Negative.   Endo/Heme/Allergies: Negative.   Psychiatric/Behavioral: Negative.      Past Medical History  She,  has a past medical history of Acute respiratory failure ([place]) (06/09/2017), Arthritis, Back pain, Bell's palsy, Bipolar disorder ([place]), Bronchitis, Chronic low back pain (05/09/2015), COPD (chronic obstructive pulmonary disease) ([place]), Cough with expectoration (05/21/16), Cyst of right kidney, Diabetes mellitus without complication ([place]), Frequency of urination, GERD (gastroesophageal reflux disease), Glaucoma, History of blood transfusion, History of colon polyps, History of hiatal hernia, History of tobacco use, Hypercalcemia, Hyperlipidemia, Hypertension, Memory disorder (09/05/2014), On home oxygen therapy, Schizo-affective psychosis ([place]), Shortness of breath dyspnea, and Uterine fibroid.   Surgical History    Past Surgical History:  Procedure Laterality Date  . ABDOMINAL HYSTERECTOMY    . APPENDECTOMY    . COLONOSCOPY W/ POLYPECTOMY    . IR GENERIC HISTORICAL  04/29/2016   IR RADIOLOGIST EVAL & MGMT 04/29/2016 Corrie Mckusick, DO [facility] INTER[facility] RAD  . IR GENERIC HISTORICAL  10/01/2016   IR RADIOLOGIST EVAL & MGMT 10/01/2016 [facility] INTER[facility]  . IR GENERIC HISTORICAL   11/11/2016   IR RADIOLOGIST EVAL & MGMT 11/11/2016 Corrie Mckusick, DO [facility]  . IR GENERIC HISTORICAL  07/16/2016   IR RADIOLOGIST EVAL & MGMT 07/16/2016 Corrie Mckusick, DO [facility] INTER[facility] RAD  . IR RADIOLOGIST EVAL & MGMT  03/10/2017  . IR RADIOLOGIST EVAL & MGMT  05/13/2017  . IR RADIOLOGIST EVAL & MGMT  04/27/2018  . LUMBAR LAMINECTOMY/DECOMPRESSION MICRODISCECTOMY N/A 08/14/2015   Procedure: LUMBAR DECOMPRESSION MICRODISCECTOMY L3-S1;  Surgeon: Melina Schools, MD;  Location: [facility];  Service: Orthopedics;  Laterality: N/A;  . RADIOFREQUENCY ABLATION Left 02/05/2017   Procedure: LEFT RENAL CRYOABLATION;  Surgeon: Corrie Mckusick, DO;  Location: [facility];  Service: Anesthesiology;  Laterality: Left;  . RIGHT HEART CATH N/A 07/20/2017   Procedure: Right Heart Cath;  Surgeon: Nigel Mormon, MD;  Location: [facility];  Service: Cardiovascular;  Laterality: N/A;  . RIGHT/LEFT HEART CATH AND CORONARY ANGIOGRAPHY N/A 06/11/2017   Procedure: Right/Left Heart Cath and Coronary Angiography;  Surgeon: Dixie Dials, MD;  Location: [facility];  Service: Cardiovascular;  Laterality: N/A;  . TONSILLECTOMY AND ADENOIDECTOMY       Social History   reports that she quit smoking about 3 years ago. Her smoking use included cigarettes. She has a 28.00 pack-year smoking history. She has never used smokeless tobacco. She reports that she does not drink alcohol or use drugs.   Family History   Her family history includes Alzheimer's disease in her mother and sister; Dementia in her mother; Diabetes in her father and mother; Heart attack in her brother; Heart disease in her brother and mother; Hypertension in her mother. There is no history of Bladder Cancer, Kidney cancer, or Prostate cancer.   Allergies Allergies  Allergen Reactions  . Codeine Nausea And Vomiting  . Nitrostat [Nitroglycerin] Other (See Comments)    Pt is on revatio  . Prednisone Other (See Comments)    Has to monitor because she  is a diabetic      Home Medications  Prior to Admission medications   Medication Sig Start Date End Date Taking? Authorizing Provider  acetaminophen (TYLENOL) 650 MG CR tablet Take 1,300 mg by mouth every 12 (twelve) hours as needed for pain.    Yes [provider]  albuterol (PROAIR HFA) 108 (90 Base) MCG/ACT inhaler Inhale 2 puffs into the lungs every 6 (six) hours as needed for wheezing or shortness of breath.   Yes [provider]  albuterol (PROVENTIL) (2.5 MG/3ML) 0.083% nebulizer solution Take 2.5 mg by  nebulization every 4 (four) hours as needed for wheezing or shortness of breath.    Yes [provider]  amLODipine (NORVASC) 10 MG tablet Take 1 tablet (10 mg total) by mouth daily. 01/12/19  Yes Shelly Coss, MD  ARIPiprazole (ABILIFY) 20 MG tablet Take 1 tablet (20 mg total) by mouth at bedtime. 08/27/14  Yes Kerrie Buffalo, NP  ASPERCREME LIDOCAINE EX Apply 1 application topically 2 (two) times daily as needed (back pain).   Yes [provider]  aspirin EC 81 MG EC tablet Take 1 tablet (81 mg total) by mouth daily. 06/15/17  Yes Regalado, Belkys A, MD  atorvastatin (LIPITOR) 20 MG tablet Take 20 mg by mouth daily after supper.   Yes [provider]  bisacodyl (DULCOLAX) 10 MG suppository Place 1 suppository (10 mg total) rectally daily as needed for moderate constipation. 09/08/16  Yes Vann, Jessica U, DO  brimonidine (ALPHAGAN) 0.2 % ophthalmic solution Place 1 drop into both eyes 2 (two) times daily. 02/09/19  Yes [provider]  budesonide-formoterol (SYMBICORT) 160-4.5 MCG/ACT inhaler Inhale 2 puffs into the lungs 2 (two) times daily. 01/23/19  Yes Tanda Rockers, MD  donepezil (ARICEPT) 5 MG tablet Take 1 tablet (5 mg total) by mouth at bedtime. 08/27/14  Yes Kerrie Buffalo, NP  dorzolamide-timolol (COSOPT) 22.3-6.8 MG/ML ophthalmic solution Place 1 drop into both eyes 2 (two) times daily.   Yes [provider]  ferrous  sulfate 325 (65 FE) MG tablet Take 325 mg by mouth daily after supper.   Yes [provider]  furosemide (LASIX) 80 MG tablet Take 1 tablet (80 mg total) by mouth daily. 01/12/19  Yes Shelly Coss, MD  glimepiride (AMARYL) 4 MG tablet Take 0.5 tablets (2 mg total) by mouth daily with breakfast. 06/14/17  Yes Regalado, Belkys A, MD  Ipratropium-Albuterol (COMBIVENT RESPIMAT) 20-100 MCG/ACT AERS respimat Inhale 1-2 puffs into the lungs every 4 (four) hours as needed for wheezing.    Yes [provider]  lamoTRIgine (LAMICTAL) 100 MG tablet Take 1 tablet (100 mg total) by mouth every evening. Patient taking differently: Take 100 mg by mouth daily after supper.  08/27/14  Yes Kerrie Buffalo, NP  latanoprost (XALATAN) 0.005 % ophthalmic solution Place 1 drop into both eyes at bedtime. 08/27/14  Yes Kerrie Buffalo, NP  Magnesium 250 MG TABS Take 250 mg by mouth See admin instructions. Take 1 tablet (250 mg) by mouth every other day after supper   Yes [provider]  Menthol, Topical Analgesic, (BIOFREEZE EX) Apply 1 application topically 2 (two) times daily as needed (back pain).   Yes [provider]  metFORMIN (GLUCOPHAGE) 1000 MG tablet Take 1 tablet (1,000 mg total) by mouth daily with breakfast. Patient taking differently: Take 1,000 mg by mouth daily with breakfast. If not eating well, give 500 mg twice daily instead of 10104m daily 07/13/17  Yes NMariel Aloe MD  OPSUMIT 10 MG tablet Take 10 mg by mouth daily.  10/17/18  Yes [provider]  OXYGEN Inhale 7 L into the lungs See admin instructions.    Yes [provider]  Phenylephrine-APAP-Guaifenesin (MUCINEX SINUS-MAX) 10-650-400 MG/20ML LIQD Take 20 mLs by mouth 2 (two) times daily.   Yes [provider]  polyethylene glycol (MIRALAX / GLYCOLAX) packet Take 17 g by mouth daily. Patient taking differently: Take 17 g by mouth daily as needed (for constipation). Mix in 4-8 oz liquid and  drink 09/08/16  Yes VGeradine Girt DO  Selenium Sulf-Pyrithione-Urea 2.25 % SHAM Apply 1 application topically See admin instructions. Apply topically to scalp for dermatitis once every 14 days 01/25/15  Yes [provider]  senna (SENOKOT) 8.6 MG tablet Take 1 tablet by mouth daily as needed for constipation.    Yes [provider]  sildenafil (REVATIO) 20 MG tablet Take 1 tablet (20 mg total) by mouth 3 (three) times daily. 06/14/17  Yes Regalado, Belkys A, MD  feeding supplement (BOOST / RESOURCE BREEZE) LIQD Take 1 Container by mouth 3 (three) times daily between meals. Patient not taking: Reported on 02/24/2019 07/13/17   Mariel Aloe, MD  glucose blood (ACCU-CHEK AVIVA) test strip 1 each by Other route 2 (two) times daily. 08/04/16   Jearld Fenton, NP  Respiratory Therapy Supplies (FLUTTER) DEVI Use as directed. 05/26/17   Parrett, Fonnie Mu, NP     Critical care time: 32 min   Erick Colace ACNP-BC [place] Pager # [phone] OR # [phone] if no answer  Attending Note:  68 year old female with extensive cardiopulmonary history to include pulmonary HTN, COPD and OSA.  Patient presents with an acute exacerbation of her right heart failure.  On exam, bibasilar crackles.  I reviewed CXR myself, pulmonary edema noted.  Discussed with PCCM-NP.  Acute respiratory failure:             - Treat pulmonary HTN             - DNR status             - Will likely need a trilogy upon discharge  Hypoxemia:             - Titrate O2 for sat of >90%  Acute pulmonary edema:             - Lasix IV as ordered  Pulmonary HTN:             - Revatio             - Echo             - Opsumit  OSA:             - BiPAP at night             - Trilogy on discharge if qualifies  COPD:             - Symbicort             - Albuterol             - Duonebs             - No steroids  PCCM will follow.  Patient seen and examined, agree with above note.   I dictated the care and orders written for this patient under my direction.  Rush Farmer, [place]

## 2019-02-24 NOTE — ED Notes (Signed)
Called RT to take pt off Bipap and apply high-flow per Dr. Darl Householder.

## 2019-02-24 NOTE — ED Notes (Signed)
ED TO INPATIENT HANDOFF REPORT  ED Nurse Name and Phone #:  Ginny Forth [phone]  S Name/Age/Gender Elizabeth Thompson 68 y.o. female Room/Bed: 047C/047C  Code Status   Code Status: DNR  Home/SNF/Other Home Patient oriented to: self, place, time and situation Is this baseline? Yes   Triage Complete: Triage complete  Chief Complaint sob  Triage Note Pt arrives with [facility] from home c/o SHOB x4 days. Per EMS, pt has hx of pulmonary HTN and repiratory problems. Pt reports seeing RT 4 days ago. Per EMS, pt was 61% on RA; given saline neb on route, O2 sat up to 85%.  EMS vitals: HR 88 BP 134/73 85% O2 CBG 106   Allergies Allergies  Allergen Reactions  . Codeine Nausea And Vomiting  . Nitrostat [Nitroglycerin] Other (See Comments)    Pt is on revatio  . Prednisone Other (See Comments)    Has to monitor because she is a diabetic     Level of Care/Admitting Diagnosis ED Disposition    ED Disposition Condition [place]: [place] [[id number]]  Level of Care: Progressive [102]  Diagnosis: Acute on chronic respiratory failure [facility]) [[id number]]  Admitting Physician: Lady Deutscher [[id number]]  Attending Physician: Lady Deutscher [[id number]]  Estimated length of stay: 5 - 7 days  Certification:: I certify this patient will need inpatient services for at least 2 midnights  PT Class (Do Not Modify): Inpatient [101]  PT Acc Code (Do Not Modify): Private [1]       B Medical/Surgery History Past Medical History:  Diagnosis Date  . Acute respiratory failure ([place]) 06/09/2017  . Arthritis   . Back pain   . Bell's palsy   . Bipolar disorder ([place])   . Bronchitis   . Chronic low back pain 05/09/2015  . COPD (chronic obstructive pulmonary disease) ([place])    hyoxia during sleep- using O2 as needed  . Cough with expectoration 05/21/16   recent diagnosis of bronchitis  . Cyst of right kidney   . Diabetes mellitus without complication ([place])     type 2  . Frequency of urination   . GERD (gastroesophageal reflux disease)   . Glaucoma   . History of blood transfusion   . History of colon polyps   . History of hiatal hernia   . History of tobacco use   . Hypercalcemia   . Hyperlipidemia   . Hypertension   . Memory disorder 09/05/2014  . On home oxygen therapy    3L/M [place] at night   . Schizo-affective psychosis ([place])   . Shortness of breath dyspnea    exertion, or with out oxygen  . Uterine fibroid    Past Surgical History:  Procedure Laterality Date  . ABDOMINAL HYSTERECTOMY    . APPENDECTOMY    . COLONOSCOPY W/ POLYPECTOMY    . IR GENERIC HISTORICAL  04/29/2016   IR RADIOLOGIST EVAL & MGMT 04/29/2016 Corrie Mckusick, DO [facility] INTERV RAD  . IR GENERIC HISTORICAL  10/01/2016   IR RADIOLOGIST EVAL & MGMT 10/01/2016 [facility] INTERV RAD  . IR GENERIC HISTORICAL  11/11/2016   IR RADIOLOGIST EVAL & MGMT 11/11/2016 Corrie Mckusick, DO [facility] INTERV RAD  . IR GENERIC HISTORICAL  07/16/2016   IR RADIOLOGIST EVAL & MGMT 07/16/2016 Corrie Mckusick, DO [facility] INTERV RAD  . IR RADIOLOGIST EVAL & MGMT  03/10/2017  . IR RADIOLOGIST EVAL & MGMT  05/13/2017  . IR RADIOLOGIST EVAL & MGMT  04/27/2018  .  LUMBAR LAMINECTOMY/DECOMPRESSION MICRODISCECTOMY N/A 08/14/2015   Procedure: LUMBAR DECOMPRESSION MICRODISCECTOMY L3-S1;  Surgeon: Melina Schools, MD;  Location: [place];  Service: Orthopedics;  Laterality: N/A;  . RADIOFREQUENCY ABLATION Left 02/05/2017   Procedure: LEFT RENAL CRYOABLATION;  Surgeon: Corrie Mckusick, DO;  Location: [facility];  Service: Anesthesiology;  Laterality: Left;  . RIGHT HEART CATH N/A 07/20/2017   Procedure: Right Heart Cath;  Surgeon: Nigel Mormon, MD;  Location: [place] [facility];  Service: Cardiovascular;  Laterality: N/A;  . RIGHT/LEFT HEART CATH AND CORONARY ANGIOGRAPHY N/A 06/11/2017   Procedure: Right/Left Heart Cath and Coronary Angiography;  Surgeon: Dixie Dials, MD;  Location: [place] [facility];  Service: Cardiovascular;   Laterality: N/A;  . TONSILLECTOMY AND ADENOIDECTOMY       A IV Location/Drains/Wounds Patient Lines/Drains/Airways Status   Active Line/Drains/Airways    Name:   Placement date:   Placement time:   Site:   Days:   Peripheral IV 02/24/19 Left Antecubital   02/24/19    1223    Antecubital   less than 1   Incision (Closed) 08/14/15 Back   08/14/15    1423     1290   Incision (Closed) 02/05/17 Back Left   02/05/17    2010     749   Incision - 1 Port   02/05/17    0850     749          Intake/Output Last 24 hours No intake or output data in the 24 hours ending 02/24/19 1513  Labs/Imaging Results for orders placed or performed during the hospital encounter of 02/24/19 (from the past 48 hour(s))  CBC with Differential/Platelet     Status: Abnormal   Collection Time: 02/24/19 11:50 AM  Result Value Ref Range   WBC 9.6 4.0 - 10.5 K/uL   RBC 3.24 (L) 3.87 - 5.11 MIL/uL   Hemoglobin 7.5 (L) 12.0 - 15.0 g/dL   HCT 25.4 (L) 36.0 - 46.0 %   MCV 78.4 (L) 80.0 - 100.0 fL   MCH 23.1 (L) 26.0 - 34.0 pg   MCHC 29.5 (L) 30.0 - 36.0 g/dL   RDW 17.2 (H) 11.5 - 15.5 %   Platelets 556 (H) 150 - 400 K/uL   nRBC 0.4 (H) 0.0 - 0.2 %   Neutrophils Relative % 77 %   Neutro Abs 7.5 1.7 - 7.7 K/uL   Lymphocytes Relative 13 %   Lymphs Abs 1.2 0.7 - 4.0 K/uL   Monocytes Relative 8 %   Monocytes Absolute 0.7 0.1 - 1.0 K/uL   Eosinophils Relative 1 %   Eosinophils Absolute 0.1 0.0 - 0.5 K/uL   Basophils Relative 0 %   Basophils Absolute 0.0 0.0 - 0.1 K/uL   Immature Granulocytes 1 %   Abs Immature Granulocytes 0.08 (H) 0.00 - 0.07 K/uL    Comment: Performed at [facility], [address], [place], [place] [address]  Comprehensive metabolic panel     Status: Abnormal   Collection Time: 02/24/19 11:50 AM  Result Value Ref Range   Sodium 122 (L) 135 - 145 mmol/L   Potassium 4.6 3.5 - 5.1 mmol/L   Chloride 89 (L) 98 - 111 mmol/L   CO2 21 (L) 22 - 32 mmol/L   Glucose, Bld 86 70 - 99 mg/dL    BUN 20 8 - 23 mg/dL   Creatinine, Ser 1.96 (H) 0.44 - 1.00 mg/dL   Calcium 9.1 8.9 - 10.3 mg/dL   Total  Protein 7.1 6.5 - 8.1 g/dL   Albumin 3.9 3.5 - 5.0 g/dL   AST 40 15 - 41 U/L   ALT 19 0 - 44 U/L   Alkaline Phosphatase 53 38 - 126 U/L   Total Bilirubin 0.6 0.3 - 1.2 mg/dL   GFR calc non Af Amer 26 (L) >60 mL/min   GFR calc Af Amer 30 (L) >60 mL/min   Anion gap 12 5 - 15    Comment: Performed at [facility] [address] [place], [place] [address]  Brain natriuretic peptide     Status: Abnormal   Collection Time: 02/24/19 11:50 AM  Result Value Ref Range   B Natriuretic Peptide 371.7 (H) 0.0 - 100.0 pg/mL    Comment: Performed at [facility] [address] [place], [place] [address]  Influenza panel by PCR (type A & B)     Status: None   Collection Time: 02/24/19 11:50 AM  Result Value Ref Range   Influenza A By PCR NEGATIVE NEGATIVE   Influenza B By PCR NEGATIVE NEGATIVE    Comment: (NOTE) The Xpert Xpress Flu assay is intended as an aid in the diagnosis of  influenza and should not be used as a sole basis for treatment.  This  assay is FDA approved for nasopharyngeal swab specimens only. Nasal  washings and aspirates are unacceptable for Xpert Xpress Flu testing. Performed at [facility], [place] [address] [place], [place] [address]   I-stat troponin, ED     Status: None   Collection Time: 02/24/19 11:59 AM  Result Value Ref Range   Troponin i, poc 0.01 0.00 - 0.08 ng/mL   Comment 3            Comment: Due to the release kinetics of cTnI, a negative result within the first hours of the onset of symptoms does not rule out myocardial infarction with certainty. If myocardial infarction is still suspected, repeat the test at appropriate intervals.   POCT I-Stat EG7     Status: Abnormal   Collection Time: 02/24/19 12:01 PM  Result Value Ref Range   pH, Ven 7.364 7.250 - 7.430   pCO2, Ven 39.9 (L) 44.0 - 60.0 mmHg   pO2, Ven 37.0 32.0 - 45.0  mmHg   Bicarbonate 22.8 20.0 - 28.0 mmol/L   TCO2 24 22 - 32 mmol/L   O2 Saturation 69.0 %   Acid-base deficit 2.0 0.0 - 2.0 mmol/L   Sodium 120 (L) 135 - 145 mmol/L   Potassium 4.7 3.5 - 5.1 mmol/L   Calcium, Ion 1.12 (L) 1.15 - 1.40 mmol/L   HCT 26.0 (L) 36.0 - 46.0 %   Hemoglobin 8.8 (L) 12.0 - 15.0 g/dL   Patient temperature HIDE    Sample type VENOUS    Comment NOTIFIED PHYSICIAN    Dg Chest Port 1 View  Result Date: 02/24/2019 CLINICAL DATA:  Shortness of breath for the past 4 days. EXAM: PORTABLE CHEST 1 VIEW COMPARISON:  01/24/2019. FINDINGS: Poor inspiration. No gross change in borderline enlargement of the cardiac silhouette. No Grace change in diffuse prominence of the interstitial markings with normal vasculature. No pleural fluid seen. Thoracic spine degenerative changes. IMPRESSION: No acute abnormality. Stable borderline cardiomegaly and moderate chronic interstitial lung disease when a decreased depth of inspiration is taken into account. Electronically Signed   By: Claudie Revering M.D.   On: 02/24/2019 12:18    Pending Labs Unresulted Labs (From admission, onward)  Start     Ordered   03/03/19 0500  Creatinine, serum  (enoxaparin (LOVENOX)    CrCl < 30 ml/min)  Weekly,   R    Comments:  while on enoxaparin therapy.    02/24/19 1407   02/25/19 6629  Basic metabolic panel  Tomorrow morning,   R     02/24/19 1407   02/25/19 0500  CBC  Tomorrow morning,   R     02/24/19 1407   02/24/19 1500  Hemoglobin A1c  Once,   R     02/24/19 1500   02/24/19 1406  Respiratory Panel by PCR  (Respiratory virus panel with precautions)  ONCE - STAT,   R     02/24/19 1407   02/24/19 1402  HIV antibody  Once,   R     02/24/19 1407   02/24/19 1402  CBC  (enoxaparin (LOVENOX)    CrCl < 30 ml/min)  Once,   R    Comments:  Baseline for enoxaparin therapy IF NOT ALREADY DRAWN.  Notify MD if PLT < 100 K.    02/24/19 1407   02/24/19 1402  Creatinine, serum  (enoxaparin (LOVENOX)    CrCl <  30 ml/min)  Once,   R    Comments:  Baseline for enoxaparin therapy IF NOT ALREADY DRAWN.    02/24/19 1407          Vitals/Pain Today's Vitals   02/24/19 1300 02/24/19 1330 02/24/19 1400 02/24/19 1453  BP: 136/64 (!) 111/59 127/70   Pulse: 82 86 99   Resp: _0 SpO2: 100% 100% 95% 98%  Weight:      Height:      PainSc:        Isolation Precautions Droplet precaution  Medications Medications  acetaminophen (TYLENOL) tablet 650 mg (has no administration in time range)  aspirin EC tablet 81 mg (has no administration in time range)  amLODipine (NORVASC) tablet 10 mg (has no administration in time range)  atorvastatin (LIPITOR) tablet 20 mg (has no administration in time range)  macitentan (OPSUMIT) tablet 10 mg (has no administration in time range)  sildenafil (REVATIO) tablet 20 mg (has no administration in time range)  ARIPiprazole (ABILIFY) tablet 20 mg (has no administration in time range)  donepezil (ARICEPT) tablet 5 mg (has no administration in time range)  glimepiride (AMARYL) tablet 2 mg (has no administration in time range)  metFORMIN (GLUCOPHAGE) tablet 1,000 mg (has no administration in time range)  bisacodyl (DULCOLAX) suppository 10 mg (has no administration in time range)  polyethylene glycol (MIRALAX / GLYCOLAX) packet 17 g (has no administration in time range)  senna (SENOKOT) tablet 8.6 mg (has no administration in time range)  ferrous sulfate tablet 325 mg (has no administration in time range)  lamoTRIgine (LAMICTAL) tablet 100 mg (has no administration in time range)  Magnesium TABS 250 mg (has no administration in time range)  mometasone-formoterol (DULERA) 200-5 MCG/ACT inhaler 2 puff (has no administration in time range)  lidocaine (LIDODERM) 5 % 1 patch (has no administration in time range)  brimonidine (ALPHAGAN) 0.2 % ophthalmic solution 1 drop (has no administration in time range)  dorzolamide-timolol (COSOPT) 22.3-6.8 MG/ML ophthalmic  solution 1 drop (has no administration in time range)  latanoprost (XALATAN) 0.005 % ophthalmic solution 1 drop (has no administration in time range)  Menthol (Topical Analgesic) 4 % GEL (has no administration in time range)  enoxaparin (LOVENOX) injection 30 mg (has no administration in time range)  sodium chloride flush (NS) 0.9 % injection 3 mL (has no administration in time range)  sodium chloride flush (NS) 0.9 % injection 3 mL (has no administration in time range)  0.9 %  sodium chloride infusion (has no administration in time range)  ipratropium-albuterol (DUONEB) 0.5-2.5 (3) MG/3ML nebulizer solution 3 mL (3 mLs Nebulization Given 02/24/19 1453)  albuterol (PROVENTIL) (2.5 MG/3ML) 0.083% nebulizer solution 2.5 mg (has no administration in time range)  insulin aspart (novoLOG) injection 0-20 Units (has no administration in time range)  insulin aspart (novoLOG) injection 0-5 Units (has no administration in time range)  insulin aspart (novoLOG) injection 4 Units (has no administration in time range)  furosemide (LASIX) injection 80 mg (has no administration in time range)  methylPREDNISolone sodium succinate (SOLU-MEDROL) 125 mg/2 mL injection 125 mg (125 mg Intravenous Given 02/24/19 1223)  magnesium sulfate IVPB 2 g 50 mL (0 g Intravenous Stopped 02/24/19 1352)  albuterol (PROVENTIL,VENTOLIN) solution continuous neb (15 mg/hr Nebulization Given 02/24/19 1213)  ipratropium (ATROVENT) nebulizer solution 1 mg (1 mg Nebulization Given 02/24/19 1214)    Mobility non-ambulatory High fall risk   Focused Assessments Cardiac Assessment Handoff:  Cardiac Rhythm: Normal sinus rhythm Lab Results  Component Value Date   TROPONINI <0.03 01/25/2019   Lab Results  Component Value Date   DDIMER 1.19 (H) 01/25/2019   Does the Patient currently have chest pain? No     R Recommendations: See Admitting Provider Note  Report given to:   Additional Notes:  bipap

## 2019-02-24 NOTE — H&P (Addendum)
Unassigned patient history and Physical    Elizabeth Thompson ZLD:[id number] DOB: June 01, 1951 DOA: 02/24/2019  PCP: Everardo Beals, NP  Patient coming from: Home  I have personally briefly reviewed patient's old medical records in [place]  Chief Complaint: Worsening shortness of breath oxygen had to be increased from 6 L to 7 L  HPI: Elizabeth Thompson is a 68 y.o. female with medical history significant of Polar disorder, pulmonary hypertension with COPD on 6 L nasal cannula at baseline, diabetes type 2 who presented with worsening shortness of breath and a nonproductive cough.  Follows up with pulmonary (Dr. Melvyn Novas) for her lung issues and just finished a course of prednisone taper about a month ago.  She was discharged from the hospital on January 29th after an approximate 10-day stay.  She has been having worsening nonproductive cough for the last several days.  She lives at home with her niece and they have a pulse oximeter at home and who routinely checks it.  It reads in the 70s usually.  The patient actually had a VQ scan done about a month ago that showed no PE.  She is currently not on any blood thinners.  She was noted to be hypoxic about 61% on room air per EMS.  Patient's niece reports that when her hypoxia level increases and she gets into the 50s she will place her prone to improve her oxygenation or on her right side lying down and her sats can increase to is much is 93%.  When EMS arrived her sats were 61% on room air.  She was placed on increased nasal cannula and went up to 85%.  She was also given some saline nebs by EMS.  ED Course: Patient given methylprednisolone 125 mg IV, 2 g of magnesium sulfate.  Albuterol continuous nebulizer as well as Atrovent nebulizer.  She was placed on BiPAP for about an hour and her sats improved so she has now come off BiPAP and is currently on 15 L nasal cannula.  Review of Systems: As per HPI otherwise all other systems reviewed and   negative.   Past Medical History:  Diagnosis Date   Acute respiratory failure ([place]) 06/09/2017   Arthritis    Back pain    Bell's palsy    Bipolar disorder (HCC)    Bronchitis    Chronic low back pain 05/09/2015   COPD (chronic obstructive pulmonary disease) (HCC)    hyoxia during sleep- using O2 as needed   Cough with expectoration 05/21/16   recent diagnosis of bronchitis   Cyst of right kidney    Diabetes mellitus without complication (HCC)    type 2   Frequency of urination    GERD (gastroesophageal reflux disease)    Glaucoma    History of blood transfusion    History of colon polyps    History of hiatal hernia    History of tobacco use    Hypercalcemia    Hyperlipidemia    Hypertension    Memory disorder 09/05/2014   On home oxygen therapy    3L/M [place] at night    Schizo-affective psychosis ([place])    Shortness of breath dyspnea    exertion, or with out oxygen   Uterine fibroid     Past Surgical History:  Procedure Laterality Date   ABDOMINAL HYSTERECTOMY     APPENDECTOMY     COLONOSCOPY W/ POLYPECTOMY     IR GENERIC HISTORICAL  04/29/2016   IR RADIOLOGIST EVAL &  MGMT 04/29/2016 Corrie Mckusick, DO [facility]   IR GENERIC HISTORICAL  10/01/2016   IR RADIOLOGIST EVAL & MGMT 10/01/2016 [facility]   IR GENERIC HISTORICAL  11/11/2016   IR RADIOLOGIST EVAL & MGMT 11/11/2016 Corrie Mckusick, DO [facility]   IR GENERIC HISTORICAL  07/16/2016   IR RADIOLOGIST EVAL & MGMT 07/16/2016 Corrie Mckusick, DO [facility]   IR RADIOLOGIST EVAL & MGMT  03/10/2017   IR RADIOLOGIST EVAL & MGMT  05/13/2017   IR RADIOLOGIST EVAL & MGMT  04/27/2018   LUMBAR LAMINECTOMY/DECOMPRESSION MICRODISCECTOMY N/A 08/14/2015   Procedure: LUMBAR DECOMPRESSION MICRODISCECTOMY L3-S1;  Surgeon: Melina Schools, MD;  Location: [facility];  Service: Orthopedics;  Laterality: N/A;   RADIOFREQUENCY ABLATION Left 02/05/2017   Procedure: LEFT RENAL CRYOABLATION;   Surgeon: Corrie Mckusick, DO;  Location: [facility];  Service: Anesthesiology;  Laterality: Left;   RIGHT HEART CATH N/A 07/20/2017   Procedure: Right Heart Cath;  Surgeon: Nigel Mormon, MD;  Location: [facility];  Service: Cardiovascular;  Laterality: N/A;   RIGHT/LEFT HEART CATH AND CORONARY ANGIOGRAPHY N/A 06/11/2017   Procedure: Right/Left Heart Cath and Coronary Angiography;  Surgeon: Dixie Dials, MD;  Location: [facility];  Service: Cardiovascular;  Laterality: N/A;   TONSILLECTOMY AND ADENOIDECTOMY      Social History   Social History Narrative   Patient is right handed.   Patient drinks 2 cups caffeine daily.     reports that she quit smoking about 3 years ago. Her smoking use included cigarettes. She has a 28.00 pack-year smoking history. She has never used smokeless tobacco. She reports that she does not drink alcohol or use drugs.  Allergies  Allergen Reactions   Codeine Nausea And Vomiting   Nitrostat [Nitroglycerin] Other (See Comments)    Pt is on revatio   Prednisone Other (See Comments)    Has to monitor because she is a diabetic     Family History  Problem Relation Age of Onset   Dementia Mother    Alzheimer's disease Mother    Heart disease Mother    Hypertension Mother    Diabetes Mother    Diabetes Father    Alzheimer's disease Sister    Heart disease Brother    Heart attack Brother    Bladder Cancer Neg Hx    Kidney cancer Neg Hx    Prostate cancer Neg Hx      Prior to Admission medications   Medication Sig Start Date End Date Taking? Authorizing Provider  acetaminophen (TYLENOL) 650 MG CR tablet Take 1,300 mg by mouth every 12 (twelve) hours as needed for pain.    Yes [provider]  albuterol (PROAIR HFA) 108 (90 Base) MCG/ACT inhaler Inhale 2 puffs into the lungs every 6 (six) hours as needed for wheezing or shortness of breath.   Yes [provider]  albuterol (PROVENTIL) (2.5 MG/3ML) 0.083%  nebulizer solution Take 2.5 mg by nebulization every 4 (four) hours as needed for wheezing or shortness of breath.    Yes [provider]  amLODipine (NORVASC) 10 MG tablet Take 1 tablet (10 mg total) by mouth daily. 01/12/19  Yes Shelly Coss, MD  ARIPiprazole (ABILIFY) 20 MG tablet Take 1 tablet (20 mg total) by mouth at bedtime. 08/27/14  Yes Kerrie Buffalo, NP  ASPERCREME LIDOCAINE EX Apply 1 application topically 2 (two) times daily as needed (back pain).   Yes [provider]  aspirin EC 81 MG EC  tablet Take 1 tablet (81 mg total) by mouth daily. 06/15/17  Yes Regalado, Belkys A, MD  atorvastatin (LIPITOR) 20 MG tablet Take 20 mg by mouth daily after supper.   Yes [provider]  bisacodyl (DULCOLAX) 10 MG suppository Place 1 suppository (10 mg total) rectally daily as needed for moderate constipation. 09/08/16  Yes Vann, Jessica U, DO  brimonidine (ALPHAGAN) 0.2 % ophthalmic solution Place 1 drop into both eyes 2 (two) times daily. 02/09/19  Yes [provider]  budesonide-formoterol (SYMBICORT) 160-4.5 MCG/ACT inhaler Inhale 2 puffs into the lungs 2 (two) times daily. 01/23/19  Yes Tanda Rockers, MD  donepezil (ARICEPT) 5 MG tablet Take 1 tablet (5 mg total) by mouth at bedtime. 08/27/14  Yes Kerrie Buffalo, NP  dorzolamide-timolol (COSOPT) 22.3-6.8 MG/ML ophthalmic solution Place 1 drop into both eyes 2 (two) times daily.   Yes [provider]  ferrous sulfate 325 (65 FE) MG tablet Take 325 mg by mouth daily after supper.   Yes [provider]  furosemide (LASIX) 80 MG tablet Take 1 tablet (80 mg total) by mouth daily. 01/12/19  Yes Shelly Coss, MD  glimepiride (AMARYL) 4 MG tablet Take 0.5 tablets (2 mg total) by mouth daily with breakfast. 06/14/17  Yes Regalado, Belkys A, MD  Ipratropium-Albuterol (COMBIVENT RESPIMAT) 20-100 MCG/ACT AERS respimat Inhale 1-2 puffs into the lungs every 4 (four) hours as needed for wheezing.    Yes  [provider]  lamoTRIgine (LAMICTAL) 100 MG tablet Take 1 tablet (100 mg total) by mouth every evening. Patient taking differently: Take 100 mg by mouth daily after supper.  08/27/14  Yes Kerrie Buffalo, NP  latanoprost (XALATAN) 0.005 % ophthalmic solution Place 1 drop into both eyes at bedtime. 08/27/14  Yes Kerrie Buffalo, NP  Magnesium 250 MG TABS Take 250 mg by mouth See admin instructions. Take 1 tablet (250 mg) by mouth every other day after supper   Yes [provider]  Menthol, Topical Analgesic, (BIOFREEZE EX) Apply 1 application topically 2 (two) times daily as needed (back pain).   Yes [provider]  metFORMIN (GLUCOPHAGE) 1000 MG tablet Take 1 tablet (1,000 mg total) by mouth daily with breakfast. Patient taking differently: Take 1,000 mg by mouth daily with breakfast. If not eating well, give 500 mg twice daily instead of 1051m daily 07/13/17  Yes NMariel Aloe MD  OPSUMIT 10 MG tablet Take 10 mg by mouth daily.  10/17/18  Yes [provider]  OXYGEN Inhale 7 L into the lungs See admin instructions.    Yes [provider]  Phenylephrine-APAP-Guaifenesin (MUCINEX SINUS-MAX) 10-650-400 MG/20ML LIQD Take 20 mLs by mouth 2 (two) times daily.   Yes [provider]  polyethylene glycol (MIRALAX / GLYCOLAX) packet Take 17 g by mouth daily. Patient taking differently: Take 17 g by mouth daily as needed (for constipation). Mix in 4-8 oz liquid and drink 09/08/16  Yes Vann, Jessica U, DO  Selenium Sulf-Pyrithione-Urea 2.25 % SHAM Apply 1 application topically See admin instructions. Apply topically to scalp for dermatitis once every 14 days 01/25/15  Yes [provider]  senna (SENOKOT) 8.6 MG tablet Take 1 tablet by mouth daily as needed for constipation.    Yes [provider]  sildenafil (REVATIO) 20 MG tablet Take 1 tablet (20 mg total) by mouth 3 (three) times daily. 06/14/17  Yes Regalado, Belkys A, MD  feeding  supplement (BOOST / RESOURCE BREEZE) LIQD Take 1 Container by mouth 3 (three)  times daily between meals. Patient not taking: Reported on 02/24/2019 07/13/17   Mariel Aloe, MD  glucose blood (ACCU-CHEK AVIVA) test strip 1 each by Other route 2 (two) times daily. 08/04/16   Jearld Fenton, NP  Respiratory Therapy Supplies (FLUTTER) DEVI Use as directed. 05/26/17   Parrett, Fonnie Mu, NP    Physical Exam:  Constitutional: Ill-appearing African-American female appears older than her stated age with accessory muscle use.  She is able to speak in short phrases. Vitals:   02/24/19 1230 02/24/19 1300 02/24/19 1330 02/24/19 1400  BP: 123/66 136/64 (!) 111/59 127/70  Pulse: 79 82 86 99  Resp: _0 SpO2: 100% 100% 100% 95%  Weight:      Height:       Eyes: PERRL, lids and conjunctivae normal ENMT: Mucous membranes are moist. Posterior pharynx clear of any exudate or lesions.Normal dentition.  Neck: normal, supple, no masses, no thyromegaly Respiratory: Moderate accessory muscle use, decreased breath sounds with wheezing on end expiration with poor air movement Cardiovascular: Regular rate and rhythm, no murmurs / rubs / gallops. No extremity edema. 2+ pedal pulses. No carotid bruits.  Abdomen: no tenderness, no masses palpated. No hepatosplenomegaly. Bowel sounds positive.  Musculoskeletal: no clubbing / cyanosis. No joint deformity upper and lower extremities. Good ROM, no contractures. Normal muscle tone.  Skin: no rashes, lesions, ulcers. No induration Neurologic: CN 2-12 grossly intact. Sensation intact, DTR normal. Strength 5/5 in all 4.  Psychiatric: Normal judgment and insight. Alert and oriented x 3. Normal mood.    Labs on Admission: I have personally reviewed following labs and imaging studies  CBC: Recent Labs  Lab 02/24/19 1150 02/24/19 1201  WBC 9.6  --   NEUTROABS 7.5  --   HGB 7.5* 8.8*  HCT 25.4* 26.0*  MCV 78.4*  --   PLT 556*  --    Basic Metabolic  Panel: Recent Labs  Lab 02/24/19 1150 02/24/19 1201  NA 122* 120*  K 4.6 4.7  CL 89*  --   CO2 21*  --   GLUCOSE 86  --   BUN 20  --   CREATININE 1.96*  --   CALCIUM 9.1  --    GFR: Estimated Creatinine Clearance: 28.8 mL/min (A) (by C-G formula based on SCr of 1.96 mg/dL (H)). Liver Function Tests: Recent Labs  Lab 02/24/19 1150  AST 40  ALT 19  ALKPHOS 53  BILITOT 0.6  PROT 7.1  ALBUMIN 3.9   Urine analysis:    Component Value Date/Time   COLORURINE YELLOW 07/02/2017 1825   APPEARANCEUR CLEAR 07/02/2017 1825   LABSPEC 1.006 07/02/2017 1825   PHURINE 5.0 07/02/2017 1825   GLUCOSEU NEGATIVE 07/02/2017 1825   HGBUR NEGATIVE 07/02/2017 1825   BILIRUBINUR NEGATIVE 07/02/2017 1825   KETONESUR NEGATIVE 07/02/2017 1825   PROTEINUR 30 (A) 07/02/2017 1825   UROBILINOGEN 0.2 08/17/2015 0109   NITRITE NEGATIVE 07/02/2017 1825   LEUKOCYTESUR NEGATIVE 07/02/2017 1825    Radiological Exams on Admission: Dg Chest Port 1 View  Result Date: 02/24/2019 CLINICAL DATA:  Shortness of breath for the past 4 days. EXAM: PORTABLE CHEST 1 VIEW COMPARISON:  01/24/2019. FINDINGS: Poor inspiration. No gross change in borderline enlargement of the cardiac silhouette. No Grace change in diffuse prominence of the interstitial markings with normal vasculature. No pleural fluid seen. Thoracic spine degenerative changes. IMPRESSION: No acute abnormality. Stable borderline cardiomegaly and moderate chronic interstitial lung disease when a decreased depth of inspiration is  taken into account. Electronically Signed   By: Claudie Revering M.D.   On: 02/24/2019 12:18    EKG: Independently reviewed.  Sinus rhythm with a nonspecific interventricular conduction delay with delay and ST elevations unchanged from prior  Assessment/Plan Principal Problem:   Pulmonary hypertension (HCC) Active Problems:   COPD GOLD 0    Diabetes mellitus type 2, controlled, without complications (HCC)   COPD exacerbation  (HCC)   Acute on chronic respiratory failure (HCC)   Goals of care, counseling/discussion    1.  Acute exacerbation of pulmonary hypertension with a COPD exacerbation and acute on chronic respiratory failure: We will start patient on treatment with nebulizers of ipratropium and albuterol, IV Solu-Medrol, increased oxygen supplementation, at this point patient does not have elevated white blood cell count or indicators of infection we will hold off on antibiotics pending evaluation by pulmonary.  I personally spoke to Dr. Chase Caller from pulmonary who will see the patient.  She will be admitted to the stepdown unit given her serious condition.  2.  Diabetes mellitus type 2 controlled without complications: Expect that her blood glucoses will rise we will start her on blood glucose management protocol diabetic patients receiving steroids.  3.  Goals of care counseling discussion: Patient met with nurse practitioner Shirlee Limerick from palliative care in January 2020.  At that time she was full code.  Today patient's niece and patient affirmed that she is a DO NOT RESUSCITATE and has a paper at home.  I believe this patient would benefit from hospice care at home as they would be able to provide her increased support.  I am consulting palliative care for further assistance with goals of care and planning more support at home.  4.  Hyponatremia: Patient's sodium fairly low.  Will restrict free water and recheck in a.m.  No free water or other fluids fine.  DVT prophylaxis: Subcu Lovenox Code Status: DO NOT RESUSCITATE not hospice yet wishes full scope of treatment Family Communication: Spoke with patient's niece Jinny Sanders who was present at the bedside. Disposition Plan: Home with nieces assistance.  But may benefit from rehab stay prior to discharge home to be determined likely in 5 to 7 days. Consults called:  Dr. Einar Gip from cardiology at patient request,  Dr. Chase Caller from pulmonary,  palliative  care Admission status: Inpatient to progressive care unit   Lady Deutscher MD FACP [facility] Pager [phone]  How to contact the [facility] Attending or Consulting provider [place] or covering provider during after hours [place], for this patient?  1. Check the care team in [facility] and look for a) attending/consulting [facility] provider listed and b) the [facility] team listed 2. Log into [email or web address] and use [place]'s universal password to access. If you do not have the password, please contact the hospital operator. 3. Locate the [facility] provider you are looking for under [facility] and page to a number that you can be directly reached. 4. If you still have difficulty reaching the provider, please page the [facility] (Director on Call) for the Hospitalists listed on amion for assistance.  If 7PM-7AM, please contact night-coverage [email or web address] Password [email or web address]  02/24/2019, 2:10 PM

## 2019-02-24 NOTE — ED Notes (Addendum)
RT at bedside applying Bipap per Dr. Darl Householder.

## 2019-02-24 NOTE — ED Triage Notes (Signed)
Pt arrives with [facility] from home c/o SHOB x4 days. Per EMS, pt has hx of pulmonary HTN and repiratory problems. Pt reports seeing RT 4 days ago. Per EMS, pt was 61% on RA; given saline neb on route, O2 sat up to 85%.  EMS vitals: HR 88 BP 134/73 85% O2 CBG 106

## 2019-02-24 NOTE — ED Provider Notes (Signed)
[facility] EMERGENCY DEPARTMENT Provider Note   CSN: [id number] Arrival date & time: 02/24/19  1146    History   Chief Complaint Chief Complaint  Patient presents with  . Shortness of Breath    HPI Elizabeth Thompson is a 68 y.o. female hx of bipolar, COPD on 6 L nasal cannula at baseline, DM, here presenting with worsening shortness of breath, nonproductive cough.  Patient follows up with pulmonary for COPD and finished a course of prednisone taper about a month ago.  Patient states that she is on 6 L nasal cannula at baseline.  Patient states that she has been having worsening nonproductive cough for the last several days.  She lives at home with her niece and they have a pulse ox at home that routinely reads in the 70s.  Patient actually had a VQ scan done about a month ago that showed no PE.  She is not currently on any blood thinners.  Patient was noted to be hypoxic about 61% on room air per EMS.  Patient was put on next liter nasal cannula and went up to 85%.  Patient was given some saline nebs by EMS.      The history is provided by the patient.    Past Medical History:  Diagnosis Date  . Acute respiratory failure ([place]) 06/09/2017  . Arthritis   . Back pain   . Bell's palsy   . Bipolar disorder ([place])   . Bronchitis   . Chronic low back pain 05/09/2015  . COPD (chronic obstructive pulmonary disease) ([place])    hyoxia during sleep- using O2 as needed  . Cough with expectoration 05/21/16   recent diagnosis of bronchitis  . Cyst of right kidney   . Diabetes mellitus without complication ([place])    type 2  . Frequency of urination   . GERD (gastroesophageal reflux disease)   . Glaucoma   . History of blood transfusion   . History of colon polyps   . History of hiatal hernia   . History of tobacco use   . Hypercalcemia   . Hyperlipidemia   . Hypertension   . Memory disorder 09/05/2014  . On home oxygen therapy    3L/M [place] at night   . Schizo-affective  psychosis ([place])   . Shortness of breath dyspnea    exertion, or with out oxygen  . Uterine fibroid     Patient Active Problem List   Diagnosis Date Noted  . Goals of care, counseling/discussion   . Palliative care by specialist   . OSA (obstructive sleep apnea)   . AKI (acute kidney injury) ([place]) 01/03/2019  . Acute respiratory failure ([place]) 01/03/2019  . Hypertension 07/02/2017  . Chronic respiratory failure with hypoxia, on home O2 therapy ([place]) 07/02/2017  . COPD (chronic obstructive pulmonary disease) ([place]) 07/02/2017  . Diabetes mellitus without complication ([place]) 76/22/6333  . Pulmonary hypertension ([place]) 07/02/2017  . Bipolar disorder ([place]) 07/02/2017  . Hyperlipidemia 07/02/2017  . Acute hyponatremia 07/02/2017  . Acute metabolic encephalopathy 54/56/2563  . CKD (chronic kidney disease) stage 3, GFR 30-59 ml/min (HCC) 07/02/2017  . Elevated troponin   . Cor pulmonale, acute ([place])   . Acute on chronic respiratory failure ([place]) 06/09/2017  . Chronic renal disease, stage III ([place]) 05/05/2017  . Chronic respiratory failure with hypoxia ([place]) 05/05/2017  . Renal cell carcinoma, left ([place]) 02/05/2017  . Dyspnea 12/28/2016  . Depression 09/04/2016  . Renal malignant tumor ([place]) 06/26/2016  . GERD (  gastroesophageal reflux disease) 03/22/2016  . Insomnia 03/22/2016  . COPD GOLD 0  12/06/2015  . Essential hypertension 12/06/2015  . COPD exacerbation ([place]) 11/20/2015  . Multiple lung nodules 11/20/2015  . Morbid obesity due to excess calories ([place]) 11/20/2015  . Hypersomnia with sleep apnea 11/20/2015  . Diabetes mellitus type 2, controlled, without complications ([place]) 03/54/6568  . HLD (hyperlipidemia) 11/06/2015  . Chronic low back pain 05/09/2015  . Gait disorder 05/09/2015  . Memory disorder 09/05/2014  . Schizoaffective disorder ([place]) 08/18/2014    Past Surgical History:  Procedure Laterality Date  . ABDOMINAL HYSTERECTOMY    . APPENDECTOMY    . COLONOSCOPY W/  POLYPECTOMY    . IR GENERIC HISTORICAL  04/29/2016   IR RADIOLOGIST EVAL & MGMT 04/29/2016 Corrie Mckusick, DO [facility] INTERV RAD  . IR GENERIC HISTORICAL  10/01/2016   IR RADIOLOGIST EVAL & MGMT 10/01/2016 [facility] INTERV RAD  . IR GENERIC HISTORICAL  11/11/2016   IR RADIOLOGIST EVAL & MGMT 11/11/2016 Corrie Mckusick, DO [facility] INTER[facility] RAD  . IR GENERIC HISTORICAL  07/16/2016   IR RADIOLOGIST EVAL & MGMT 07/16/2016 Corrie Mckusick, DO [facility] INTER[facility] RAD  . IR RADIOLOGIST EVAL & MGMT  03/10/2017  . IR RADIOLOGIST EVAL & MGMT  05/13/2017  . IR RADIOLOGIST EVAL & MGMT  04/27/2018  . LUMBAR LAMINECTOMY/DECOMPRESSION MICRODISCECTOMY N/A 08/14/2015   Procedure: LUMBAR DECOMPRESSION MICRODISCECTOMY L3-S1;  Surgeon: Melina Schools, MD;  Location: [facility];  Service: Orthopedics;  Laterality: N/A;  . RADIOFREQUENCY ABLATION Left 02/05/2017   Procedure: LEFT RENAL CRYOABLATION;  Surgeon: Corrie Mckusick, DO;  Location: [facility];  Service: Anesthesiology;  Laterality: Left;  . RIGHT HEART CATH N/A 07/20/2017   Procedure: Right Heart Cath;  Surgeon: Nigel Mormon, MD;  Location: [facility];  Service: Cardiovascular;  Laterality: N/A;  . RIGHT/LEFT HEART CATH AND CORONARY ANGIOGRAPHY N/A 06/11/2017   Procedure: Right/Left Heart Cath and Coronary Angiography;  Surgeon: Dixie Dials, MD;  Location: [facility];  Service: Cardiovascular;  Laterality: N/A;  . TONSILLECTOMY AND ADENOIDECTOMY       OB History   No obstetric history on file.      Home Medications    Prior to Admission medications   Medication Sig Start Date End Date Taking? Authorizing Provider  acetaminophen (TYLENOL) 650 MG CR tablet Take 1,300 mg by mouth every 12 (twelve) hours as needed for pain.    Yes [provider]  albuterol (PROAIR HFA) 108 (90 Base) MCG/ACT inhaler Inhale 2 puffs into the lungs every 6 (six) hours as needed for wheezing or shortness of breath.   Yes [provider]  albuterol (PROVENTIL) (2.5 MG/3ML)  0.083% nebulizer solution Take 2.5 mg by nebulization every 4 (four) hours as needed for wheezing or shortness of breath. PLAN C   Yes [provider]  amLODipine (NORVASC) 10 MG tablet Take 1 tablet (10 mg total) by mouth daily. 01/12/19  Yes Shelly Coss, MD  ARIPiprazole (ABILIFY) 20 MG tablet Take 1 tablet (20 mg total) by mouth at bedtime. 08/27/14  Yes Kerrie Buffalo, NP  ASPERCREME LIDOCAINE EX Apply 1 application topically 2 (two) times daily as needed (back pain).   Yes [provider]  aspirin EC 81 MG EC tablet Take 1 tablet (81 mg total) by mouth daily. 06/15/17  Yes Regalado, Belkys A, MD  atorvastatin (LIPITOR) 20 MG tablet Take 20 mg by mouth daily after supper.   Yes [provider]  bisacodyl (DULCOLAX) 10 MG  suppository Place 1 suppository (10 mg total) rectally daily as needed for moderate constipation. 09/08/16  Yes Vann, Jessica U, DO  brimonidine (ALPHAGAN) 0.2 % ophthalmic solution Place 1 drop into both eyes 2 (two) times daily. 02/09/19  Yes [provider]  budesonide-formoterol (SYMBICORT) 160-4.5 MCG/ACT inhaler Inhale 2 puffs into the lungs 2 (two) times daily. 01/23/19  Yes Tanda Rockers, MD  donepezil (ARICEPT) 5 MG tablet Take 1 tablet (5 mg total) by mouth at bedtime. 08/27/14  Yes Kerrie Buffalo, NP  dorzolamide-timolol (COSOPT) 22.3-6.8 MG/ML ophthalmic solution Place 1 drop into both eyes 2 (two) times daily.   Yes [provider]  ferrous sulfate 325 (65 FE) MG tablet Take 325 mg by mouth daily after supper.   Yes [provider]  furosemide (LASIX) 80 MG tablet Take 1 tablet (80 mg total) by mouth daily. 01/12/19  Yes Shelly Coss, MD  glimepiride (AMARYL) 4 MG tablet Take 0.5 tablets (2 mg total) by mouth daily with breakfast. 06/14/17  Yes Regalado, Belkys A, MD  Ipratropium-Albuterol (COMBIVENT RESPIMAT) 20-100 MCG/ACT AERS respimat Inhale 1-2 puffs into the lungs every 4 (four) hours as needed for wheezing.  PLAN B    Yes [provider]  lamoTRIgine (LAMICTAL) 100 MG tablet Take 1 tablet (100 mg total) by mouth every evening. Patient taking differently: Take 100 mg by mouth daily after supper.  08/27/14  Yes Kerrie Buffalo, NP  latanoprost (XALATAN) 0.005 % ophthalmic solution Place 1 drop into both eyes at bedtime. 08/27/14  Yes Kerrie Buffalo, NP  Magnesium 250 MG TABS Take 250 mg by mouth See admin instructions. Take 1 tablet (250 mg) by mouth every other day after supper   Yes [provider]  Menthol, Topical Analgesic, (BIOFREEZE EX) Apply 1 application topically 2 (two) times daily as needed (back pain).   Yes [provider]  metFORMIN (GLUCOPHAGE) 1000 MG tablet Take 1 tablet (1,000 mg total) by mouth daily with breakfast. Patient taking differently: Take 1,000 mg by mouth daily with breakfast. If not eating well, give 500 mg twice daily instead of 1073m daily 07/13/17  Yes NMariel Aloe MD  OPSUMIT 10 MG tablet Take 10 mg by mouth daily.  10/17/18  Yes [provider]  OXYGEN Inhale 7 L into the lungs See admin instructions.    Yes [provider]  Phenylephrine-APAP-Guaifenesin (MUCINEX SINUS-MAX) 10-650-400 MG/20ML LIQD Take 20 mLs by mouth 2 (two) times daily.   Yes [provider]  polyethylene glycol (MIRALAX / GLYCOLAX) packet Take 17 g by mouth daily. Patient taking differently: Take 17 g by mouth daily as needed (for constipation). Mix in 4-8 oz liquid and drink 09/08/16  Yes Vann, Jessica U, DO  Selenium Sulf-Pyrithione-Urea 2.25 % SHAM Apply 1 application topically See admin instructions. Apply topically to scalp for dermatitis once every 14 days 01/25/15  Yes [provider]  senna (SENOKOT) 8.6 MG tablet Take 1 tablet by mouth daily as needed for constipation.    Yes [provider]  sildenafil (REVATIO) 20 MG tablet Take 1 tablet (20 mg total) by mouth 3 (three) times daily. 06/14/17  Yes Regalado, Belkys A, MD   feeding supplement (BOOST / RESOURCE BREEZE) LIQD Take 1 Container by mouth 3 (three) times daily between meals. Patient not taking: Reported on 02/24/2019 07/13/17   NMariel Aloe MD  glucose blood (ACCU-CHEK AVIVA) test strip 1 each by Other route 2 (two) times daily. 08/04/16   BJearld Fenton NP  prednisoLONE 5 MG TABS tablet Take 30 mg daily for 2 days then 20 mg daily for 2 days then 10 mg daily for 3 days then stop Patient not taking: Reported on 02/24/2019 01/12/19   Shelly Coss, MD  predniSONE (DELTASONE) 20 MG tablet Take 2 tablets (40 mg total) by mouth daily. Take 69m for 5 days, then decrease to 288m(1tab) po for 5 days,and then 1054m1/2 tab) for 5 days Patient not taking: Reported on 02/24/2019 01/25/19   PluBlanchie DessertD  Respiratory Therapy Supplies (FLUTTER) DEVI Use as directed. 05/26/17   Parrett, TamFonnie MuP    Family History Family History  Problem Relation Age of Onset  . Dementia Mother   . Alzheimer's disease Mother   . Heart disease Mother   . Hypertension Mother   . Diabetes Mother   . Diabetes Father   . Alzheimer's disease Sister   . Heart disease Brother   . Heart attack Brother   . Bladder Cancer Neg Hx   . Kidney cancer Neg Hx   . Prostate cancer Neg Hx     Social History Social History   Tobacco Use  . Smoking status: Former Smoker    Packs/day: 1.00    Years: 28.00    Pack years: 28.00    Types: Cigarettes    Last attempt to quit: 07/15/2015    Years since quitting: 3.6  . Smokeless tobacco: Never Used  Substance Use Topics  . Alcohol use: No    Alcohol/week: 0.0 standard drinks  . Drug use: No     Allergies   Codeine; Nitrostat [nitroglycerin]; and Prednisone   Review of Systems Review of Systems  Respiratory: Positive for shortness of breath.   All other systems reviewed and are negative.    Physical Exam Updated Vital Signs BP 123/66   Pulse 79   Resp 13   Ht _0  (1.6 m)   Wt 87.1 kg   SpO2 100%   BMI 34.01  kg/m   Physical Exam Vitals signs and nursing note reviewed.  Constitutional:      Comments: Tachypneic, moderate respiratory distress   HENT:     Head: Normocephalic.     Mouth/Throat:     Pharynx: Oropharynx is clear.  Eyes:     Extraocular Movements: Extraocular movements intact.     Pupils: Pupils are equal, round, and reactive to light.  Neck:     Musculoskeletal: Normal range of motion.  Cardiovascular:     Rate and Rhythm: Normal rate and regular rhythm.  Pulmonary:     Comments: Tachypneic, moderate distress, + retractions, diminished breath sounds bilaterally  Abdominal:     General: Bowel sounds are normal.     Palpations: Abdomen is soft.  Musculoskeletal: Normal range of motion.  Skin:    General: Skin is warm.     Capillary Refill: Capillary refill takes less than 2 seconds.  Neurological:     General: No focal deficit present.     Mental Status: She is alert and oriented to person, place, and time.  Psychiatric:        Mood and Affect: Mood normal.        Behavior: Behavior normal.      ED Treatments / Results  Labs (all labs ordered are listed, but only abnormal results are displayed) Labs Reviewed  CBC WITH DIFFERENTIAL/PLATELET - Abnormal; Notable for the following components:      Result Value   RBC 3.24 (*)  Hemoglobin 7.5 (*)    HCT 25.4 (*)    MCV 78.4 (*)    MCH 23.1 (*)    MCHC 29.5 (*)    RDW 17.2 (*)    Platelets 556 (*)    nRBC 0.4 (*)    Abs Immature Granulocytes 0.08 (*)    All other components within normal limits  COMPREHENSIVE METABOLIC PANEL - Abnormal; Notable for the following components:   Sodium 122 (*)    Chloride 89 (*)    CO2 21 (*)    Creatinine, Ser 1.96 (*)    GFR calc non Af Amer 26 (*)    GFR calc Af Amer 30 (*)    All other components within normal limits  POCT I-STAT EG7 - Abnormal; Notable for the following components:   pCO2, Ven 39.9 (*)    Sodium 120 (*)    Calcium, Ion 1.12 (*)    HCT 26.0 (*)     Hemoglobin 8.8 (*)    All other components within normal limits  BRAIN NATRIURETIC PEPTIDE  INFLUENZA PANEL BY PCR (TYPE A & B)  I-STAT TROPONIN, ED    EKG EKG Interpretation  Date/Time:  Friday February 24 2019 11:47:04 EDT Ventricular Rate:  87 PR Interval:    QRS Duration: 126 QT Interval:  396 QTC Calculation: 477 R Axis:   105 Text Interpretation:  Sinus rhythm Nonspecific intraventricular conduction delay ST elevation, consider inferior injury No significant change since last tracing Confirmed by Wandra Arthurs ([id number]) on 02/24/2019 12:04:15 PM   Radiology Dg Chest Port 1 View  Result Date: 02/24/2019 CLINICAL DATA:  Shortness of breath for the past 4 days. EXAM: PORTABLE CHEST 1 VIEW COMPARISON:  01/24/2019. FINDINGS: Poor inspiration. No gross change in borderline enlargement of the cardiac silhouette. No Grace change in diffuse prominence of the interstitial markings with normal vasculature. No pleural fluid seen. Thoracic spine degenerative changes. IMPRESSION: No acute abnormality. Stable borderline cardiomegaly and moderate chronic interstitial lung disease when a decreased depth of inspiration is taken into account. Electronically Signed   By: Claudie Revering M.D.   On: 02/24/2019 12:18    Procedures Procedures (including critical care time)  CRITICAL CARE Performed by: Wandra Arthurs   Total critical care time: 30 minutes  Critical care time was exclusive of separately billable procedures and treating other patients.  Critical care was necessary to treat or prevent imminent or life-threatening deterioration.  Critical care was time spent personally by me on the following activities: development of treatment plan with patient and/or surrogate as well as nursing, discussions with consultants, evaluation of patient's response to treatment, examination of patient, obtaining history from patient or surrogate, ordering and performing treatments and interventions, ordering and  review of laboratory studies, ordering and review of radiographic studies, pulse oximetry and re-evaluation of patient's condition.   Medications Ordered in ED Medications  magnesium sulfate IVPB 2 g 50 mL (2 g Intravenous New Bag/Given 02/24/19 1228)  methylPREDNISolone sodium succinate (SOLU-MEDROL) 125 mg/2 mL injection 125 mg (125 mg Intravenous Given 02/24/19 1223)  albuterol (PROVENTIL,VENTOLIN) solution continuous neb (15 mg/hr Nebulization Given 02/24/19 1213)  ipratropium (ATROVENT) nebulizer solution 1 mg (1 mg Nebulization Given 02/24/19 1214)     Initial Impression / Assessment and Plan / ED Course  I have reviewed the triage vital signs and the nursing notes.  Pertinent labs & imaging results that were available during my care of the patient were reviewed by me and considered in my medical decision  making (see chart for details).       Elizabeth Thompson is a 68 y.o. female hx of COPD on 6 L [place] at baseline here with SOB. Had recent VQ scan that is negative. O2 61 % on RA per EMS. I suspect COPD vs CHF. Will put patient on bipap.   1 pm VBG reassuring on bipap. Given continuous nebs, steroids. Still tachypneic but felt better. O2 saturation improved. Will try high flow nasal cannula.   1:56 PM Patient improved on high flow oxygen. Hospitalist to admit to stepdown for COPD exacerbation. Flu pending.    Final Clinical Impressions(s) / ED Diagnoses   Final diagnoses:  None    ED Discharge Orders    None       Drenda Freeze, MD 02/24/19 1357

## 2019-02-24 NOTE — Progress Notes (Signed)
Pt taken off bipap and placed on HFNC at 8 L, increased to 10, then to 15L per ED MD. Pt spo2 dropped to 79% but increased to 90% on 15L HFNC and 8L CAT neb tx going simultaneously. RN at bedside. Pt will more than likely need to go back on bipap when [facility] tx is done. RT will closely monitor pt

## 2019-02-24 NOTE — ED Notes (Signed)
ED Provider at bedside.

## 2019-02-24 NOTE — ED Notes (Signed)
Critical Lab results was given To Nurse.

## 2019-02-24 NOTE — ED Notes (Signed)
ED Provider at bedside. 

## 2019-02-24 NOTE — Progress Notes (Signed)
Patient requested to have her CODE STATUS changed back to full code.

## 2019-02-24 NOTE — ED Notes (Signed)
Hospitalist at the bedside 

## 2019-02-25 ENCOUNTER — Inpatient Hospital Stay (HOSPITAL_COMMUNITY): Payer: Medicare Other

## 2019-02-25 DIAGNOSIS — E8770 Fluid overload, unspecified: Secondary | ICD-10-CM

## 2019-02-25 DIAGNOSIS — I2781 Cor pulmonale (chronic): Secondary | ICD-10-CM

## 2019-02-25 DIAGNOSIS — D649 Anemia, unspecified: Secondary | ICD-10-CM

## 2019-02-25 DIAGNOSIS — I50813 Acute on chronic right heart failure: Secondary | ICD-10-CM

## 2019-02-25 DIAGNOSIS — Z515 Encounter for palliative care: Secondary | ICD-10-CM

## 2019-02-25 DIAGNOSIS — E871 Hypo-osmolality and hyponatremia: Secondary | ICD-10-CM

## 2019-02-25 LAB — RETICULOCYTES
Immature Retic Fract: 40.9 % — ABNORMAL HIGH (ref 2.3–15.9)
RBC.: 3.32 MIL/uL — ABNORMAL LOW (ref 3.87–5.11)
Retic Count, Absolute: 127.2 10*3/uL (ref 19.0–186.0)
Retic Ct Pct: 3.8 % — ABNORMAL HIGH (ref 0.4–3.1)

## 2019-02-25 LAB — BASIC METABOLIC PANEL
Anion gap: 14 (ref 5–15)
Anion gap: 12 (ref 5–15)
BUN: 28 mg/dL — ABNORMAL HIGH (ref 8–23)
BUN: 34 mg/dL — ABNORMAL HIGH (ref 8–23)
CHLORIDE: 86 mmol/L — AB (ref 98–111)
CO2: 19 mmol/L — ABNORMAL LOW (ref 22–32)
CO2: 19 mmol/L — ABNORMAL LOW (ref 22–32)
Calcium: 9 mg/dL (ref 8.9–10.3)
Calcium: 8.8 mg/dL — ABNORMAL LOW (ref 8.9–10.3)
Chloride: 87 mmol/L — ABNORMAL LOW (ref 98–111)
Creatinine, Ser: 2.22 mg/dL — ABNORMAL HIGH (ref 0.44–1.00)
Creatinine, Ser: 2.42 mg/dL — ABNORMAL HIGH (ref 0.44–1.00)
GFR calc Af Amer: 26 mL/min — ABNORMAL LOW (ref >60)
GFR calc Af Amer: 23 mL/min — ABNORMAL LOW (ref >60)
GFR calc non Af Amer: 22 mL/min — ABNORMAL LOW (ref >60)
GFR calc non Af Amer: 20 mL/min — ABNORMAL LOW (ref >60)
Glucose, Bld: 209 mg/dL — ABNORMAL HIGH (ref 70–99)
Glucose, Bld: 84 mg/dL (ref 70–99)
Potassium: 4.7 mmol/L (ref 3.5–5.1)
Potassium: 4.8 mmol/L (ref 3.5–5.1)
Sodium: 119 mmol/L — CL (ref 135–145)
Sodium: 118 mmol/L — CL (ref 135–145)

## 2019-02-25 LAB — CBC
HEMATOCRIT: 22.8 % — AB (ref 36.0–46.0)
Hemoglobin: 6.9 g/dL — CL (ref 12.0–15.0)
MCH: 23.6 pg — ABNORMAL LOW (ref 26.0–34.0)
MCHC: 30.3 g/dL (ref 30.0–36.0)
MCV: 78.1 fL — ABNORMAL LOW (ref 80.0–100.0)
Platelets: 548 10*3/uL — ABNORMAL HIGH (ref 150–400)
RBC: 2.92 MIL/uL — ABNORMAL LOW (ref 3.87–5.11)
RDW: 17.3 % — ABNORMAL HIGH (ref 11.5–15.5)
WBC: 5.6 10*3/uL (ref 4.0–10.5)
nRBC: 0.4 % — ABNORMAL HIGH (ref 0.0–0.2)

## 2019-02-25 LAB — NA AND K (SODIUM & POTASSIUM), RAND UR
Potassium Urine: 19 mmol/L
Sodium, Ur: 10 mmol/L

## 2019-02-25 LAB — GLUCOSE, CAPILLARY
GLUCOSE-CAPILLARY: 144 mg/dL — AB (ref 70–99)
Glucose-Capillary: 299 mg/dL — ABNORMAL HIGH (ref 70–99)
Glucose-Capillary: 76 mg/dL (ref 70–99)
Glucose-Capillary: 98 mg/dL (ref 70–99)
Glucose-Capillary: 84 mg/dL (ref 70–99)

## 2019-02-25 LAB — ABO/RH: ABO/RH(D): O POS

## 2019-02-25 LAB — FOLATE: Folate: 36.1 ng/mL (ref >5.9)

## 2019-02-25 LAB — IRON AND TIBC
Iron: 31 ug/dL (ref 28–170)
Saturation Ratios: 6 % — ABNORMAL LOW (ref 10.4–31.8)
TIBC: 490 ug/dL — ABNORMAL HIGH (ref 250–450)
UIBC: 459 ug/dL

## 2019-02-25 LAB — FERRITIN: FERRITIN: 26 ng/mL (ref 11–307)

## 2019-02-25 LAB — CHLORIDE, URINE, RANDOM: Chloride Urine: 24 mmol/L

## 2019-02-25 LAB — PREPARE RBC (CROSSMATCH)

## 2019-02-25 LAB — VITAMIN B12: Vitamin B-12: 1459 pg/mL — ABNORMAL HIGH (ref 180–914)

## 2019-02-25 LAB — HEMOGLOBIN AND HEMATOCRIT, BLOOD
HCT: 25.1 % — ABNORMAL LOW (ref 36.0–46.0)
Hemoglobin: 7.7 g/dL — ABNORMAL LOW (ref 12.0–15.0)

## 2019-02-25 LAB — HIV ANTIBODY (ROUTINE TESTING W REFLEX): HIV Screen 4th Generation wRfx: NONREACTIVE

## 2019-02-25 LAB — URIC ACID: Uric Acid, Serum: 9.2 mg/dL — ABNORMAL HIGH (ref 2.5–7.1)

## 2019-02-25 MED ORDER — METHYLPREDNISOLONE SODIUM SUCC 40 MG IJ SOLR
40.0000 mg | Freq: Four times a day (QID) | INTRAMUSCULAR | Status: DC
  Administered 2019-02-25 – 2019-02-26 (×4): 40 mg via INTRAVENOUS
  Filled 2019-02-25 (×4): qty 1

## 2019-02-25 MED ORDER — ENSURE ENLIVE PO LIQD: 237.0000 mL | Freq: Two times a day (BID) | ORAL | Status: DC

## 2019-02-25 MED ORDER — SALINE SPRAY 0.65 % NA SOLN
1.0000 | NASAL | Status: DC | PRN
  Administered 2019-02-25 – 2019-02-28 (×3): 1 via NASAL
  Filled 2019-02-25: qty 44

## 2019-02-25 MED ORDER — FUROSEMIDE 10 MG/ML IJ SOLN
80.0000 mg | Freq: Three times a day (TID) | INTRAMUSCULAR | Status: AC
  Administered 2019-02-26 – 2019-02-28 (×10): 80 mg via INTRAVENOUS
  Filled 2019-02-25 (×11): qty 8

## 2019-02-25 MED ORDER — AMOXICILLIN-POT CLAVULANATE 875-125 MG PO TABS: 1.0000 | ORAL_TABLET | Freq: Two times a day (BID) | ORAL | Status: DC

## 2019-02-25 MED ORDER — MUPIROCIN 2 % EX OINT
1.0000 "application " | TOPICAL_OINTMENT | Freq: Two times a day (BID) | CUTANEOUS | Status: AC
  Administered 2019-02-25 – 2019-03-01 (×10): 1 via NASAL
  Filled 2019-02-25 (×2): qty 22

## 2019-02-25 MED ORDER — SODIUM CHLORIDE 0.9 % IV SOLN
INTRAVENOUS | Status: DC
  Administered 2019-02-25: 17:00:00 via INTRAVENOUS

## 2019-02-25 MED ORDER — CHLORHEXIDINE GLUCONATE CLOTH 2 % EX PADS
6.0000 | MEDICATED_PAD | Freq: Every day | CUTANEOUS | Status: AC
  Administered 2019-02-25 – 2019-02-28 (×4): 6 via TOPICAL

## 2019-02-25 MED ORDER — AMOXICILLIN-POT CLAVULANATE 500-125 MG PO TABS
1.0000 | ORAL_TABLET | Freq: Two times a day (BID) | ORAL | Status: DC
  Administered 2019-02-25 – 2019-03-04 (×14): 500 mg via ORAL
  Filled 2019-02-25 (×14): qty 1

## 2019-02-25 NOTE — Progress Notes (Signed)
Initial Nutrition Assessment  DOCUMENTATION CODES:   Obesity unspecified  INTERVENTION:   Ensure Enlive po BID, each supplement provides 350 kcal and 20 grams of protein  NUTRITION DIAGNOSIS:   Inadequate oral intake related to (difficulty breathing) as evidenced by (report from RN).  GOAL:   Patient will meet greater than or equal to 90% of their needs  MONITOR:   PO intake, Supplement acceptance  REASON FOR ASSESSMENT:   Consult COPD Protocol  ASSESSMENT:   68 yo female with PMH of HTN, Bell's palsy, schizo-affective psychosis, HF, HLD, bipolar disorder, DM-2, COPD who was admitted with worsening SOB.  Unable to speak with patient or complete NFPE. She was receiving nursing care in her room.  RN reports that patient has been eating poorly due to difficulty breathing.   Palliative Care team is following. Patient is currently a DNR and does not want artificial feeding. She is eligible for Hospice services per review of progress notes.   Labs reviewed. Sodium 119 (L), creatinine 2.22 (H), Hgb 6.9 (L) CBG's: 144-76 Medications reviewed and include iron, lasix, novolog, mag-ox.  NUTRITION - FOCUSED PHYSICAL EXAM:  unable to complete  Diet Order:   Diet Order            Diet Carb Modified Fluid consistency: Thin; Room service appropriate? Yes  Diet effective now              EDUCATION NEEDS:   No education needs have been identified at this time  Skin:  Skin Assessment: Reviewed RN Assessment  Last BM:  3/14  Height:   Ht Readings from Last 1 Encounters:  02/24/19 5' 3" (1.6 m)    Weight:   Wt Readings from Last 1 Encounters:  02/25/19 91.2 kg    Ideal Body Weight:  52.3 kg  BMI:  Body mass index is 35.61 kg/m.  Estimated Nutritional Needs:   Kcal:  1700-1900  Protein:  90-100 gm  Fluid:  1.8 L    Molli Barrows, RD, LDN, CNSC Pager [phone] After Hours Pager [phone]

## 2019-02-25 NOTE — Progress Notes (Signed)
RT Note:  RN called me and asked me to come assess patient in 5W18, stated that patient sats had fallen in the 70s when she had tried to put sterile water in the HFNC.  Patient had been on HFNC 15L and was doing great with sats in upper 90s to 100%, but any change, sometimes is a little much for patient to recover from.  RN placed patient on NRB 15L and sats increased.  RT heard audible wheezing and gave patient a PRN Albuterol treatment.  Patient is still on NRB at this time; will try HFNC again in about an hour to see if patient can tolerate.  Will continue to monitor.

## 2019-02-25 NOTE — Progress Notes (Signed)
Patient started to destat with 15L HF [place], to 70's. Placed patient on non-rebreather and call Respirtory Therapy to bedside. She agreed with the plan and addtiionally gave patient a breathing treatment. Will continue to monitor. Patient sats currently 100% on non-rebreather.

## 2019-02-25 NOTE — Progress Notes (Addendum)
Placed patient on 15L non-rebreather at approximately 1945 after observing SpO2 87-89% at rest. Remained on non-rebreather throughout night. Informed day nurse of rationale for maintaining 15L non-rebreather.   Notified by lab at 2253 patient Na+ is 118. Notified [place] at 2254. No action taken.

## 2019-02-25 NOTE — Consult Note (Signed)
CARDIOLOGY CONSULT NOTE  Patient ID: Elizabeth Thompson MRN: [id number] DOB/AGE: 07/23/1951 68 y.o.  Admit date: 02/24/2019 Referring Physician  Randa Spike, MD Primary Physician:  Everardo Beals, NP Reason for Consultation  CHF  HPI: Elizabeth Thompson  is a 68 y.o. female  With WHO group 3 PAH. She has h/o left superior pole renal cell carcinoma cryoablation on July 2017 with second ablation on a new lesion on 10/01/2016. Prior tobacco use disorder (40 pack year quit 2017) with severe COPD, uses home oxygen, diabetes mellitus, hyperlipidemia, hypertension, GERD, PH treated with Opsumit and Sildenafil. Now admitted with dyspnea and severe anemia. I was asked by the primary team to see the patient for my opinion regarding management of CHF and PH. She is well known to me.  Patient admitted with worsening dyspnea, hypoxemia and acute respiratory distress needing BiPAP support in the emergency room.  She is presently on nasal cannula oxygen and receiving blood transfusion.  Patient except for dyspnea and being extremely fatigued, denies any other symptoms.  No fever or chills.  No disturbances in her bowels or bladder, states that her appetite is low but was able to eat her breakfast this morning her niece is present at the bedside who is the caregiver. Past Medical History:  Diagnosis Date  . Acute respiratory failure ([place]) 06/09/2017  . Arthritis   . Back pain   . Bell's palsy   . Bipolar disorder ([place])   . Bronchitis   . Chronic low back pain 05/09/2015  . COPD (chronic obstructive pulmonary disease) ([place])    hyoxia during sleep- using O2 as needed  . Cough with expectoration 05/21/16   recent diagnosis of bronchitis  . Cyst of right kidney   . Diabetes mellitus without complication ([place])    type 2  . Frequency of urination   . GERD (gastroesophageal reflux disease)   . Glaucoma   . History of blood transfusion   . History of colon polyps   . History of hiatal hernia   .  History of tobacco use   . Hypercalcemia   . Hyperlipidemia   . Hypertension   . Memory disorder 09/05/2014  . On home oxygen therapy    3L/M [place] at night   . Schizo-affective psychosis ([place])   . Shortness of breath dyspnea    exertion, or with out oxygen  . Uterine fibroid      Past Surgical History:  Procedure Laterality Date  . ABDOMINAL HYSTERECTOMY    . APPENDECTOMY    . COLONOSCOPY W/ POLYPECTOMY    . IR GENERIC HISTORICAL  04/29/2016   IR RADIOLOGIST EVAL & MGMT 04/29/2016 Corrie Mckusick, DO [facility]  . IR GENERIC HISTORICAL  10/01/2016   IR RADIOLOGIST EVAL & MGMT 10/01/2016 [facility]  . IR GENERIC HISTORICAL  11/11/2016   IR RADIOLOGIST EVAL & MGMT 11/11/2016 Corrie Mckusick, DO [facility]  . IR GENERIC HISTORICAL  07/16/2016   IR RADIOLOGIST EVAL & MGMT 07/16/2016 Corrie Mckusick, DO [facility]  . IR RADIOLOGIST EVAL & MGMT  03/10/2017  . IR RADIOLOGIST EVAL & MGMT  05/13/2017  . IR RADIOLOGIST EVAL & MGMT  04/27/2018  . LUMBAR LAMINECTOMY/DECOMPRESSION MICRODISCECTOMY N/A 08/14/2015   Procedure: LUMBAR DECOMPRESSION MICRODISCECTOMY L3-S1;  Surgeon: Melina Schools, MD;  Location: [facility];  Service: Orthopedics;  Laterality: N/A;  . RADIOFREQUENCY ABLATION Left 02/05/2017   Procedure: LEFT RENAL CRYOABLATION;  Surgeon: Corrie Mckusick, DO;  Location: [facility];  Service:  Anesthesiology;  Laterality: Left;  . RIGHT HEART CATH N/A 07/20/2017   Procedure: Right Heart Cath;  Surgeon: Nigel Mormon, MD;  Location: [facility];  Service: Cardiovascular;  Laterality: N/A;  . RIGHT/LEFT HEART CATH AND CORONARY ANGIOGRAPHY N/A 06/11/2017   Procedure: Right/Left Heart Cath and Coronary Angiography;  Surgeon: Dixie Dials, MD;  Location: [facility];  Service: Cardiovascular;  Laterality: N/A;  . TONSILLECTOMY AND ADENOIDECTOMY       Family History  Problem Relation Age of Onset  . Dementia Mother   . Alzheimer's disease Mother   . Heart disease Mother    . Hypertension Mother   . Diabetes Mother   . Diabetes Father   . Alzheimer's disease Sister   . Heart disease Brother   . Heart attack Brother   . Bladder Cancer Neg Hx   . Kidney cancer Neg Hx   . Prostate cancer Neg Hx      Social History: Social History   Socioeconomic History  . Marital status: Divorced    Spouse name: Not on file  . Number of children: 1  . Years of education: college 4  . Highest education level: Not on file  Occupational History  . Occupation: disabled  Social Needs  . Financial resource strain: Not on file  . Food insecurity:    Worry: Not on file    Inability: Not on file  . Transportation needs:    Medical: Not on file    Non-medical: Not on file  Tobacco Use  . Smoking status: Former Smoker    Packs/day: 1.00    Years: 28.00    Pack years: 28.00    Types: Cigarettes    Last attempt to quit: 07/15/2015    Years since quitting: 3.6  . Smokeless tobacco: Never Used  Substance and Sexual Activity  . Alcohol use: No    Alcohol/week: 0.0 standard drinks  . Drug use: No  . Sexual activity: Not Currently    Birth control/protection: None  Lifestyle  . Physical activity:    Days per week: Not on file    Minutes per session: Not on file  . Stress: Not on file  Relationships  . Social connections:    Talks on phone: Not on file    Gets together: Not on file    Attends religious service: Not on file    Active member of club or organization: Not on file    Attends meetings of clubs or organizations: Not on file    Relationship status: Not on file  . Intimate partner violence:    Fear of current or ex partner: Not on file    Emotionally abused: Not on file    Physically abused: Not on file    Forced sexual activity: Not on file  Other Topics Concern  . Not on file  Social History Narrative   Patient is right handed.   Patient drinks 2 cups caffeine daily.     Medications Prior to Admission  Medication Sig Dispense Refill Last Dose   . acetaminophen (TYLENOL) 650 MG CR tablet Take 1,300 mg by mouth every 12 (twelve) hours as needed for pain.    02/23/2019 at Unknown time  . albuterol (PROAIR HFA) 108 (90 Base) MCG/ACT inhaler Inhale 2 puffs into the lungs every 6 (six) hours as needed for wheezing or shortness of breath.   Past Week at Unknown time  . albuterol (PROVENTIL) (2.5 MG/3ML) 0.083% nebulizer solution Take 2.5  mg by nebulization every 4 (four) hours as needed for wheezing or shortness of breath.    02/24/2019 at Unknown time  . amLODipine (NORVASC) 10 MG tablet Take 1 tablet (10 mg total) by mouth daily. 30 tablet 0 02/24/2019 at Unknown time  . ARIPiprazole (ABILIFY) 20 MG tablet Take 1 tablet (20 mg total) by mouth at bedtime. 30 tablet 0 02/23/2019 at Unknown time  . ASPERCREME LIDOCAINE EX Apply 1 application topically 2 (two) times daily as needed (back pain).   02/23/2019 at Unknown time  . aspirin EC 81 MG EC tablet Take 1 tablet (81 mg total) by mouth daily. 30 tablet 0 02/23/2019 at Unknown time  . atorvastatin (LIPITOR) 20 MG tablet Take 20 mg by mouth daily after supper.   02/23/2019 at Unknown time  . bisacodyl (DULCOLAX) 10 MG suppository Place 1 suppository (10 mg total) rectally daily as needed for moderate constipation. 12 suppository 0 Past Month at Unknown time  . brimonidine (ALPHAGAN) 0.2 % ophthalmic solution Place 1 drop into both eyes 2 (two) times daily.   02/23/2019 at Unknown time  . budesonide-formoterol (SYMBICORT) 160-4.5 MCG/ACT inhaler Inhale 2 puffs into the lungs 2 (two) times daily. 1 Inhaler 0 02/24/2019 at Unknown time  . donepezil (ARICEPT) 5 MG tablet Take 1 tablet (5 mg total) by mouth at bedtime. 30 tablet 0 02/23/2019 at Unknown time  . dorzolamide-timolol (COSOPT) 22.3-6.8 MG/ML ophthalmic solution Place 1 drop into both eyes 2 (two) times daily.   02/23/2019 at Unknown time  . ferrous sulfate 325 (65 FE) MG tablet Take 325 mg by mouth daily after supper.   02/23/2019 at Unknown time  .  furosemide (LASIX) 80 MG tablet Take 1 tablet (80 mg total) by mouth daily. 30 tablet 0 02/24/2019 at Unknown time  . glimepiride (AMARYL) 4 MG tablet Take 0.5 tablets (2 mg total) by mouth daily with breakfast. 30 tablet 0 02/23/2019 at Unknown time  . Ipratropium-Albuterol (COMBIVENT RESPIMAT) 20-100 MCG/ACT AERS respimat Inhale 1-2 puffs into the lungs every 4 (four) hours as needed for wheezing.    02/24/2019 at Unknown time  . lamoTRIgine (LAMICTAL) 100 MG tablet Take 1 tablet (100 mg total) by mouth every evening. (Patient taking differently: Take 100 mg by mouth daily after supper. ) 30 tablet 0 02/23/2019 at Unknown time  . latanoprost (XALATAN) 0.005 % ophthalmic solution Place 1 drop into both eyes at bedtime. 2.5 mL 12 02/23/2019 at Unknown time  . Magnesium 250 MG TABS Take 250 mg by mouth See admin instructions. Take 1 tablet (250 mg) by mouth every other day after supper   02/23/2019 at Unknown time  . Menthol, Topical Analgesic, (BIOFREEZE EX) Apply 1 application topically 2 (two) times daily as needed (back pain).   02/23/2019 at Unknown time  . metFORMIN (GLUCOPHAGE) 1000 MG tablet Take 1 tablet (1,000 mg total) by mouth daily with breakfast. (Patient taking differently: Take 1,000 mg by mouth daily with breakfast. If not eating well, give 500 mg twice daily instead of 1091m daily)   02/23/2019 at Unknown time  . OPSUMIT 10 MG tablet Take 10 mg by mouth daily.    02/24/2019 at Unknown time  . OXYGEN Inhale 7 L into the lungs See admin instructions.    continuous  . Phenylephrine-APAP-Guaifenesin (MUCINEX SINUS-MAX) 10-650-400 MG/20ML LIQD Take 20 mLs by mouth 2 (two) times daily.   02/23/2019 at Unknown time  . polyethylene glycol (MIRALAX / GLYCOLAX) packet Take 17 g by mouth daily. (Patient  taking differently: Take 17 g by mouth daily as needed (for constipation). Mix in 4-8 oz liquid and drink) 14 each 0 Past Week at Unknown time  . Selenium Sulf-Pyrithione-Urea 2.25 % SHAM Apply 1 application  topically See admin instructions. Apply topically to scalp for dermatitis once every 14 days  3 Past Month at Unknown time  . senna (SENOKOT) 8.6 MG tablet Take 1 tablet by mouth daily as needed for constipation.    Past Month at Unknown time  . sildenafil (REVATIO) 20 MG tablet Take 1 tablet (20 mg total) by mouth 3 (three) times daily. 90 tablet 0 02/24/2019 at Unknown time  . feeding supplement (BOOST / RESOURCE BREEZE) LIQD Take 1 Container by mouth 3 (three) times daily between meals. (Patient not taking: Reported on 02/24/2019) 60 Container 0 Not Taking at Unknown time  . glucose blood (ACCU-CHEK AVIVA) test strip 1 each by Other route 2 (two) times daily. 200 each 5 Taking  . Respiratory Therapy Supplies (FLUTTER) DEVI Use as directed. 1 each 0 Taking    Review of Systems  Constitutional: Positive for malaise/fatigue. Negative for weight loss.  Respiratory: Positive for cough, shortness of breath and wheezing. Negative for hemoptysis and sputum production.   Cardiovascular: Positive for orthopnea. Negative for chest pain, palpitations, claudication and leg swelling.  Gastrointestinal: Negative for abdominal pain, blood in stool, constipation, heartburn and vomiting.  Genitourinary: Negative for dysuria.  Musculoskeletal: Positive for joint pain. Negative for myalgias.  Neurological: Negative for dizziness, focal weakness and headaches.  Endo/Heme/Allergies: Does not bruise/bleed easily.  Psychiatric/Behavioral: Positive for depression. The patient is nervous/anxious.   All other systems reviewed and are negative.   Physical Exam: Blood pressure 112/67, pulse 86, temperature 98.4 F (36.9 C), temperature source Oral, resp. rate 13, height _0  (1.6 m), weight 91.2 kg, SpO2 91 %.  Physical Exam  Constitutional: She appears lethargic. She is easily aroused. She appears distressed (mild respiratory distress).  Moderately obese, appears older than stated age  HENT:  Head: Atraumatic.   Eyes: Conjunctivae are normal.  Neck: Neck supple. JVD (to the angle of the jaw) present. No thyromegaly present.  Short neck and difficult to evaluate JVP  Cardiovascular: Normal rate, regular rhythm, normal heart sounds and intact distal pulses. Exam reveals no gallop.  No murmur heard. Pulses:      Carotid pulses are 2+ on the right side and 2+ on the left side.      Dorsalis pedis pulses are 2+ on the right side and 2+ on the left side.       Posterior tibial pulses are 2+ on the right side and 2+ on the left side.  Femoral and popliteal pulse difficult to feel due to patient's body habitus.   Pulmonary/Chest: She is in respiratory distress. She has no wheezes. She has no rales.  Abdominal: Soft. Bowel sounds are normal.  Obese. Pannus present. ? Hepatomegaly  Musculoskeletal: Normal range of motion.        General: No edema.  Neurological: She is easily aroused. She appears lethargic.  Skin: Skin is warm and dry.  Psychiatric: She has a normal mood and affect.    Labs:  BNP (last 3 results) Recent Labs    01/03/19 1300 01/25/19 0323 02/24/19 1150  BNP 60.2 28.1 371.7*    CMP Latest Ref Rng & Units 02/25/2019 02/24/2019 02/24/2019  Glucose 70 - 99 mg/dL 209(H) - -  BUN 8 - 23 mg/dL 28(H) - -  Creatinine 0.44 -  1.00 mg/dL 2.22(H) 2.07(H) -  Sodium 135 - 145 mmol/L 119(LL) - 120(L)  Potassium 3.5 - 5.1 mmol/L 4.7 - 4.7  Chloride 98 - 111 mmol/L 86(L) - -  CO2 22 - 32 mmol/L 19(L) - -  Calcium 8.9 - 10.3 mg/dL 9.0 - -  Total Protein 6.5 - 8.1 g/dL - - -  Total Bilirubin 0.3 - 1.2 mg/dL - - -  Alkaline Phos 38 - 126 U/L - - -  AST 15 - 41 U/L - - -  ALT 0 - 44 U/L - - -     CBC Latest Ref Rng & Units 02/25/2019 02/25/2019 02/24/2019  WBC 4.0 - 10.5 K/uL - 5.6 10.7(H)  Hemoglobin 12.0 - 15.0 g/dL 7.7(L) 6.9(LL) 7.6(L)  Hematocrit 36.0 - 46.0 % 25.1(L) 22.8(L) 24.5(L)  Platelets 150 - 400 K/uL - 548(H) 543(H)     Lipid Panel     Component Value Date/Time   CHOL 166  06/13/2017 0728   TRIG 72 06/13/2017 0728   HDL 61 06/13/2017 0728   CHOLHDL 2.7 06/13/2017 0728   VLDL 14 06/13/2017 0728   LDLCALC 91 06/13/2017 0728   LDLDIRECT 142.0 03/19/2016 1129     BNP (last 3 results) Recent Labs    01/03/19 1300 01/25/19 0323 02/24/19 1150  BNP 60.2 28.1 371.7*     HEMOGLOBIN A1C Lab Results  Component Value Date   HGBA1C 6.5 (H) 02/24/2019   MPG 139.85 02/24/2019    BMP Latest Ref Rng & Units 02/25/2019 02/24/2019 02/24/2019  Glucose 70 - 99 mg/dL 209(H) - -  BUN 8 - 23 mg/dL 28(H) - -  Creatinine 0.44 - 1.00 mg/dL 2.22(H) 2.07(H) -  BUN/Creat Ratio 11 - 26 - - -  Sodium 135 - 145 mmol/L 119(LL) - 120(L)  Potassium 3.5 - 5.1 mmol/L 4.7 - 4.7  Chloride 98 - 111 mmol/L 86(L) - -  CO2 22 - 32 mmol/L 19(L) - -  Calcium 8.9 - 10.3 mg/dL 9.0 - -     TSH No results for input(s): TSH in the last 8760 hours.   Radiology: Dg Chest 2 View  Result Date: 02/25/2019 CLINICAL DATA:  Chronic bronchitis and COPD. EXAM: CHEST - 2 VIEW COMPARISON:  02/24/2019 FINDINGS: Cardiomegaly as seen previously. Slight improvement in interstitial lung density. Findings could represent a combination of bronchitis and interstitial edema. No consolidation, collapse or effusion. IMPRESSION: Radiographic improvement consistent with improving bronchitis and or interstitial edema. Electronically Signed   By: Nelson Chimes M.D.   On: 02/25/2019 10:43   Dg Chest Port 1 View  Result Date: 02/24/2019 CLINICAL DATA:  Shortness of breath for the past 4 days. EXAM: PORTABLE CHEST 1 VIEW COMPARISON:  01/24/2019. FINDINGS: Poor inspiration. No gross change in borderline enlargement of the cardiac silhouette. No Grace change in diffuse prominence of the interstitial markings with normal vasculature. No pleural fluid seen. Thoracic spine degenerative changes. IMPRESSION: No acute abnormality. Stable borderline cardiomegaly and moderate chronic interstitial lung disease when a decreased  depth of inspiration is taken into account. Electronically Signed   By: Claudie Revering M.D.   On: 02/24/2019 12:18    Scheduled Meds: . amoxicillin-clavulanate  1 tablet Oral BID WC  . aspirin EC  81 mg Oral Daily  . atorvastatin  20 mg Oral QPC supper  . brimonidine  1 drop Both Eyes BID  . Chlorhexidine Gluconate Cloth  6 each Topical Q0600  . dorzolamide-timolol  1 drop Both Eyes BID  . enoxaparin (LOVENOX)  injection  30 mg Subcutaneous Q24H  . feeding supplement (ENSURE ENLIVE)  237 mL Oral BID BM  . ferrous sulfate  325 mg Oral QPC supper  . glimepiride  2 mg Oral Q breakfast  . insulin aspart  0-20 Units Subcutaneous TID WC  . insulin aspart  0-5 Units Subcutaneous QHS  . insulin aspart  4 Units Subcutaneous TID WC  . ipratropium-albuterol  3 mL Nebulization Q6H  . lamoTRIgine  100 mg Oral QPM  . latanoprost  1 drop Both Eyes QHS  . macitentan  10 mg Oral Daily  . magnesium oxide  200 mg Oral QODAY  . mouth rinse  15 mL Mouth Rinse BID  . methylPREDNISolone (SOLU-MEDROL) injection  40 mg Intravenous Q6H  . mometasone-formoterol  2 puff Inhalation BID  . mupirocin ointment  1 application Nasal BID  . [START ON 02/26/2019] polyethylene glycol  17 g Oral Daily  . sildenafil  20 mg Oral TID  . sodium chloride flush  3 mL Intravenous Q12H   Continuous Infusions: . sodium chloride    . sodium chloride 70 mL/hr at 02/25/19 1717   PRN Meds:.sodium chloride, acetaminophen, albuterol, bisacodyl, lidocaine, Muscle Rub, senna, sodium chloride, sodium chloride flush  CARDIAC STUDIES:  EKG 02/24/2019: Normal sinus rhythm at rate of 87 bpm, right axis deviation, poor progression, no evidence of ischemia.  Otherwise normal EKG.  Echocardiogram 01/07/2019: - Left ventricle: The cavity size was normal. Wall thickness was  increased in a pattern of mild LVH. Systolic function was normal.  The estimated ejection fraction was in the range of 55% to 60%.  Wall motion was normal; there were no  regional wall motion  abnormalities. Doppler parameters are consistent with abnormal  left ventricular relaxation (grade 1 diastolic dysfunction).  Doppler parameters are consistent with high ventricular filling  pressure. - Right ventricle: Systolic function was moderately reduced.  ASSESSMENT AND PLAN:  1.  Acute cor pulmonale secondary to underlying severe COPD exacerbated by severe anemia, progression of underlying COPD and probably pulmonary hypertension. 2.  Acute on chronic diastolic heart failure, LV 3.  Acute on chronic systolic heart failure, RV secondary to #1. 4.  Hyponatremia secondary to volume excess 5.  Anemia of chronic disease and also related to stage IV chronic kidney disease.  Acute drop in hemoglobin noted with no obvious source.  Recommendation: I do not think she needs IV fluids, etc. To hyponatremia from volume excess with elevated JVD and probably mild hepatomegaly, difficult to make out the margins, would recommend diuretics.  Otherwise she is on appropriate medical therapy, receiving blood transfusion,would follow-up on BMP, BNP in the morning.  I had a lengthy discussion with the niece regarding making patient palliative care, patient is extremely and critically ill and prognosis is extremely guarded for inpatient mortality.  Patient's niece is acceptable with the suggestion if patient does not improve over the next 24-48 hours to make her comfort care.  I'll continue to follow her sidelines.  Adrian Prows, MD, [facility] 02/25/2019, 5:37 PM [facility] [place] Pager: [phone] Office: [phone] If no answer Cell [phone]

## 2019-02-25 NOTE — Evaluation (Signed)
Physical Therapy Evaluation Patient Details Name: Elizabeth Thompson MRN: [id number] DOB: 1951/02/01 Today's Date: 02/25/2019   History of Present Illness   Elizabeth Thompson is a 68 y.o. female with medical history significant of bipolar disorder, pulmonary hypertension with COPD on 6 L nasal cannula at baseline, diabetes type 2 who presented with worsening shortness of breath and a nonproductive cough.  Work up includes acute on chronic hypoxic respiratory failure in the setting of decompensated pulmonary HTN, cor pulmonale and diastolic dysfunction with pulmonary edema.  Clinical Impression  Pt admitted with/for respiratory failure as stated above.  Pt still on 15L HFNC and needing min guard to minimal assist..  Pt currently limited functionally due to the problems listed below.  (see problems list.)  Pt will benefit from PT to maximize function and safety to be able to get home safely with available assist.     Follow Up Recommendations Home health PT;Supervision/Assistance - 24 hour    Equipment Recommendations  None recommended by PT    Recommendations for Other Services       Precautions / Restrictions Precautions Precautions: Fall Restrictions Weight Bearing Restrictions: No      Mobility  Bed Mobility Overal bed mobility: Modified Independent             General bed mobility comments: HOB elevated  Transfers Overall transfer level: Needs assistance   Transfers: Sit to/from Stand;Stand Pivot Transfers Sit to Stand: Min guard Stand pivot transfers: Min guard          Ambulation/Gait             General Gait Details: steps over to the chair with min guard assist  Stairs            Wheelchair Mobility    Modified Rankin (Stroke Patients Only)       Balance Overall balance assessment: Needs assistance   Sitting balance-Leahy Scale: Fair     Standing balance support: No upper extremity supported Standing balance-Leahy Scale: Fair                               Pertinent Vitals/Pain Pain Assessment: No/denies pain    Home Living Family/patient expects to be discharged to:: Private residence Living Arrangements: Other relatives Available Help at Discharge: Family;Available 24 hours/day Type of Home: House Home Access: Stairs to enter Entrance Stairs-Rails: None Entrance Stairs-Number of Steps: 2 on the side, and 4 in the front Home Layout: One level Home Equipment: Clinical cytogeneticist - 4 wheels;Wheelchair - manual;Bedside commode;Grab bars - tub/shower Additional Comments: Lives with Niece    Prior Function Level of Independence: Needs assistance   Gait / Transfers Assistance Needed: Uses the rollator for support  ADL's / Homemaking Assistance Needed: Niece states pt does what she can until she gets fatigued and then niece helps her finish.         Hand Dominance   Dominant Hand: Right    Extremity/Trunk Assessment   Upper Extremity Assessment Upper Extremity Assessment: Defer to OT evaluation LUE Deficits / Details: weakness  LUE Sensation: WNL LUE Coordination: decreased fine motor    Lower Extremity Assessment Lower Extremity Assessment: Generalized weakness;Overall WFL for tasks assessed       Communication   Communication: No difficulties  Cognition Arousal/Alertness: Awake/alert Behavior During Therapy: WFL for tasks assessed/performed( a little impulsive or eager ) Overall Cognitive Status: Within Functional Limits for tasks assessed(NT formally)  General Comments General comments (skin integrity, edema, etc.): Spo2 ON 15 L HFNC in upper 90's    Exercises     Assessment/Plan    PT Assessment Patient needs continued PT services  PT Problem List Decreased strength;Decreased activity tolerance;Decreased balance;Decreased mobility;Cardiopulmonary status limiting activity       PT Treatment Interventions DME  instruction;Gait training;Functional mobility training;Therapeutic activities;Stair training;Balance training;Patient/family education    PT Goals (Current goals can be found in the Care Plan section)  Acute Rehab PT Goals Patient Stated Goal: back home if at all possible. PT Goal Formulation: With patient Time For Goal Achievement: 03/11/19 Potential to Achieve Goals: Good    Frequency Min 3X/week   Barriers to discharge        Co-evaluation PT/OT/SLP Co-Evaluation/Treatment: Yes Reason for Co-Treatment: Complexity of the patient's impairments (multi-system involvement) PT goals addressed during session: Mobility/safety with mobility OT goals addressed during session: ADL's and self-care       AM-PAC PT "6 Clicks" Mobility  Outcome Measure Help needed turning from your back to your side while in a flat bed without using bedrails?: None Help needed moving from lying on your back to sitting on the side of a flat bed without using bedrails?: None Help needed moving to and from a bed to a chair (including a wheelchair)?: A Little Help needed standing up from a chair using your arms (e.g., wheelchair or bedside chair)?: A Little Help needed to walk in hospital room?: A Little Help needed climbing 3-5 steps with a railing? : A Little 6 Click Score: 20    End of Session   Activity Tolerance: Patient tolerated treatment well Patient left: in chair;with call bell/phone within reach;with chair alarm set;with family/visitor present Nurse Communication: Mobility status PT Visit Diagnosis: Other abnormalities of gait and mobility (R26.89);Difficulty in walking, not elsewhere classified (R26.2)    Time: 0404-5913 PT Time Calculation (min) (ACUTE ONLY): 23 min   Charges:   PT Evaluation $PT Eval Moderate Complexity: 1 Mod          02/25/2019  Elizabeth Thompson, PT Acute Rehabilitation Services [phone]  (pager) [phone]  (office)  Elizabeth Thompson 02/25/2019, 1:11  PM

## 2019-02-25 NOTE — Consult Note (Signed)
Consultation Note Date: 02/25/2019   Patient Name: Elizabeth Thompson  DOB: 1951-08-09  MRN: [id number]  Age / Sex: 68 y.o., female  PCP: Everardo Beals, NP Referring Physician: Guilford Shi, MD  Reason for Consultation: Establishing goals of care, Non pain symptom management and Psychosocial/spiritual support  HPI/Patient Profile: 68 y.o. female  with past medical history of end-stage COPD, pulmonary hypertension, diabetes type 2, hypertension, chronic back pain, cardiac cath 6644, diastolic heart failure EF 55 to 60%, chronic kidney disease stage IV, schizoaffective disorder admitted on 02/24/2019 with worsening shortness of breath and cough.  Per chart review, when EMS arrived to the home patient's O2 sats were 61% on room air.  Patient is followed by Dr. Melvyn Novas in the community.  She had just finished a steroids taper.  She was placed on BiPAP in the emergency room with relief and was transitioned to 15 L.  Chest x-ray at that time showed diffuse pulmonary edema, chronic interstitial lung disease.  She was seen by PCCM in consultation in the emergency room.  Consult ordered for goals of care.  Palliative medicine team has met with Ms. Walker as well as her niece in January 2020.[name]   Clinical Assessment and Goals of Care: Patient seen, chart reviewed.  Patient's niece, [name] (who is also her healthcare power of attorney; paperwork performed on 01/09/2019 see spiritual care note) is at the bedside.  Patient looks very ill.  I shared candidly with [name] that I am worried about her.  We did review some clinicals issues such as dropping hemoglobin of 6.9, sodium of 119.  She has received IV Lasix without much benefit in terms of diuresis but it her sodium has dropped.  Ms. Byers can still make some decisions for herself however she does look critically ill and I am worried how much longer she will be able to do  that.  Her healthcare power of attorney were she unable to speak for herself is her niece, [name] at [phone].  I have asked Ms. Mindi Junker to bring her documentation to the hospital.  I shared with Ms. Mindi Junker my concern for Cassondra.  I asked [name] had she ever seen her look this ill and she replied no.  Her respirations are labored and she tells me "I am tired"    SUMMARY OF RECOMMENDATIONS   Confirmed that patient is a DNR.  I have changed that in epic and conveyed to her attending Patient agreeable to transfusion Patient wants to continue to treat the treatable.  Care limits of DNR set for now Nephrology consult pending. Palliative medicine to stay involved and address further goals of care such as hemodialysis as well as evaluation for hospice Code Status/Advance Care Planning:  DNR    Symptom Management:   Dyspnea: Patient is quite short of breath but is not full comfort care.  She would benefit from opioids when appropriate.  Would recommend oxycodone concentrate given her renal impairment of 2.5 to 5 mg every 6 hours as needed  Anxiety: Verbalizing  anxiety probably secondary to the shortness of breath.  Recommend low-dose Ativan 0.25 to 0.5 mg every 6 hours as needed  Palliative Prophylaxis:   Aspiration, Bowel Regimen, Delirium Protocol, Eye Care, Frequent Pain Assessment, Oral Care and Turn Reposition  Additional Recommendations (Limitations, Scope, Preferences):  No Artificial Feeding, No Chemotherapy and No Tracheostomy  Psycho-social/Spiritual:   Desire for further Chaplaincy support:no  Prognosis:   < 6 months in the setting of worsening COPD now with acute on chronic respiratory failure, worsening anemia, chronic kidney disease stage IV.  She would qualify for hospice should this be something that she is receptive to  Discharge Planning: To Be Determined      Primary Diagnoses: Present on Admission: . COPD exacerbation ([place]) . COPD GOLD 0  . Pulmonary  hypertension ([place]) . Acute on chronic respiratory failure ([place])   I have reviewed the medical record, interviewed the patient and family, and examined the patient. The following aspects are pertinent.  Past Medical History:  Diagnosis Date  . Acute respiratory failure ([place]) 06/09/2017  . Arthritis   . Back pain   . Bell's palsy   . Bipolar disorder ([place])   . Bronchitis   . Chronic low back pain 05/09/2015  . COPD (chronic obstructive pulmonary disease) ([place])    hyoxia during sleep- using O2 as needed  . Cough with expectoration 05/21/16   recent diagnosis of bronchitis  . Cyst of right kidney   . Diabetes mellitus without complication ([place])    type 2  . Frequency of urination   . GERD (gastroesophageal reflux disease)   . Glaucoma   . History of blood transfusion   . History of colon polyps   . History of hiatal hernia   . History of tobacco use   . Hypercalcemia   . Hyperlipidemia   . Hypertension   . Memory disorder 09/05/2014  . On home oxygen therapy    3L/M [place] at night   . Schizo-affective psychosis ([place])   . Shortness of breath dyspnea    exertion, or with out oxygen  . Uterine fibroid    Social History   Socioeconomic History  . Marital status: Divorced    Spouse name: Not on file  . Number of children: 1  . Years of education: college 4  . Highest education level: Not on file  Occupational History  . Occupation: disabled  Social Needs  . Financial resource strain: Not on file  . Food insecurity:    Worry: Not on file    Inability: Not on file  . Transportation needs:    Medical: Not on file    Non-medical: Not on file  Tobacco Use  . Smoking status: Former Smoker    Packs/day: 1.00    Years: 28.00    Pack years: 28.00    Types: Cigarettes    Last attempt to quit: 07/15/2015    Years since quitting: 3.6  . Smokeless tobacco: Never Used  Substance and Sexual Activity  . Alcohol use: No    Alcohol/week: 0.0 standard drinks  . Drug use: No  .  Sexual activity: Not Currently    Birth control/protection: None  Lifestyle  . Physical activity:    Days per week: Not on file    Minutes per session: Not on file  . Stress: Not on file  Relationships  . Social connections:    Talks on phone: Not on file    Gets together: Not on file    Attends  religious service: Not on file    Active member of club or organization: Not on file    Attends meetings of clubs or organizations: Not on file    Relationship status: Not on file  Other Topics Concern  . Not on file  Social History Narrative   Patient is right handed.   Patient drinks 2 cups caffeine daily.   Family History  Problem Relation Age of Onset  . Dementia Mother   . Alzheimer's disease Mother   . Heart disease Mother   . Hypertension Mother   . Diabetes Mother   . Diabetes Father   . Alzheimer's disease Sister   . Heart disease Brother   . Heart attack Brother   . Bladder Cancer Neg Hx   . Kidney cancer Neg Hx   . Prostate cancer Neg Hx    Scheduled Meds: . amLODipine  10 mg Oral Daily  . ARIPiprazole  20 mg Oral QHS  . aspirin EC  81 mg Oral Daily  . atorvastatin  20 mg Oral QPC supper  . brimonidine  1 drop Both Eyes BID  . Chlorhexidine Gluconate Cloth  6 each Topical Q0600  . donepezil  5 mg Oral QHS  . dorzolamide-timolol  1 drop Both Eyes BID  . enoxaparin (LOVENOX) injection  30 mg Subcutaneous Q24H  . ferrous sulfate  325 mg Oral QPC supper  . furosemide  80 mg Intravenous Q12H  . glimepiride  2 mg Oral Q breakfast  . Influenza vac split quadrivalent PF  0.5 mL Intramuscular Tomorrow-1000  . insulin aspart  0-20 Units Subcutaneous TID WC  . insulin aspart  0-5 Units Subcutaneous QHS  . insulin aspart  4 Units Subcutaneous TID WC  . ipratropium-albuterol  3 mL Nebulization Q6H  . lamoTRIgine  100 mg Oral QPM  . latanoprost  1 drop Both Eyes QHS  . macitentan  10 mg Oral Daily  . magnesium oxide  200 mg Oral QODAY  . mouth rinse  15 mL Mouth Rinse  BID  . mometasone-formoterol  2 puff Inhalation BID  . mupirocin ointment  1 application Nasal BID  . [START ON 02/26/2019] polyethylene glycol  17 g Oral Daily  . sildenafil  20 mg Oral TID  . sodium chloride flush  3 mL Intravenous Q12H   Continuous Infusions: . sodium chloride     PRN Meds:.sodium chloride, acetaminophen, albuterol, bisacodyl, lidocaine, Muscle Rub, senna, sodium chloride flush Medications Prior to Admission:  Prior to Admission medications   Medication Sig Start Date End Date Taking? Authorizing Provider  acetaminophen (TYLENOL) 650 MG CR tablet Take 1,300 mg by mouth every 12 (twelve) hours as needed for pain.    Yes [provider]  albuterol (PROAIR HFA) 108 (90 Base) MCG/ACT inhaler Inhale 2 puffs into the lungs every 6 (six) hours as needed for wheezing or shortness of breath.   Yes [provider]  albuterol (PROVENTIL) (2.5 MG/3ML) 0.083% nebulizer solution Take 2.5 mg by nebulization every 4 (four) hours as needed for wheezing or shortness of breath.    Yes [provider]  amLODipine (NORVASC) 10 MG tablet Take 1 tablet (10 mg total) by mouth daily. 01/12/19  Yes Shelly Coss, MD  ARIPiprazole (ABILIFY) 20 MG tablet Take 1 tablet (20 mg total) by mouth at bedtime. 08/27/14  Yes Kerrie Buffalo, NP  ASPERCREME LIDOCAINE EX Apply 1 application topically 2 (two) times daily as needed (back pain).   Yes [provider]  aspirin EC  81 MG EC tablet Take 1 tablet (81 mg total) by mouth daily. 06/15/17  Yes Regalado, Belkys A, MD  atorvastatin (LIPITOR) 20 MG tablet Take 20 mg by mouth daily after supper.   Yes [provider]  bisacodyl (DULCOLAX) 10 MG suppository Place 1 suppository (10 mg total) rectally daily as needed for moderate constipation. 09/08/16  Yes Vann, Jessica U, DO  brimonidine (ALPHAGAN) 0.2 % ophthalmic solution Place 1 drop into both eyes 2 (two) times daily. 02/09/19  Yes [provider]   budesonide-formoterol (SYMBICORT) 160-4.5 MCG/ACT inhaler Inhale 2 puffs into the lungs 2 (two) times daily. 01/23/19  Yes Tanda Rockers, MD  donepezil (ARICEPT) 5 MG tablet Take 1 tablet (5 mg total) by mouth at bedtime. 08/27/14  Yes Kerrie Buffalo, NP  dorzolamide-timolol (COSOPT) 22.3-6.8 MG/ML ophthalmic solution Place 1 drop into both eyes 2 (two) times daily.   Yes [provider]  ferrous sulfate 325 (65 FE) MG tablet Take 325 mg by mouth daily after supper.   Yes [provider]  furosemide (LASIX) 80 MG tablet Take 1 tablet (80 mg total) by mouth daily. 01/12/19  Yes Shelly Coss, MD  glimepiride (AMARYL) 4 MG tablet Take 0.5 tablets (2 mg total) by mouth daily with breakfast. 06/14/17  Yes Regalado, Belkys A, MD  Ipratropium-Albuterol (COMBIVENT RESPIMAT) 20-100 MCG/ACT AERS respimat Inhale 1-2 puffs into the lungs every 4 (four) hours as needed for wheezing.    Yes [provider]  lamoTRIgine (LAMICTAL) 100 MG tablet Take 1 tablet (100 mg total) by mouth every evening. Patient taking differently: Take 100 mg by mouth daily after supper.  08/27/14  Yes Kerrie Buffalo, NP  latanoprost (XALATAN) 0.005 % ophthalmic solution Place 1 drop into both eyes at bedtime. 08/27/14  Yes Kerrie Buffalo, NP  Magnesium 250 MG TABS Take 250 mg by mouth See admin instructions. Take 1 tablet (250 mg) by mouth every other day after supper   Yes [provider]  Menthol, Topical Analgesic, (BIOFREEZE EX) Apply 1 application topically 2 (two) times daily as needed (back pain).   Yes [provider]  metFORMIN (GLUCOPHAGE) 1000 MG tablet Take 1 tablet (1,000 mg total) by mouth daily with breakfast. Patient taking differently: Take 1,000 mg by mouth daily with breakfast. If not eating well, give 500 mg twice daily instead of 1040m daily 07/13/17  Yes NMariel Aloe MD  OPSUMIT 10 MG tablet Take 10 mg by mouth daily.  10/17/18  Yes [provider]  OXYGEN  Inhale 7 L into the lungs See admin instructions.    Yes [provider]  Phenylephrine-APAP-Guaifenesin (MUCINEX SINUS-MAX) 10-650-400 MG/20ML LIQD Take 20 mLs by mouth 2 (two) times daily.   Yes [provider]  polyethylene glycol (MIRALAX / GLYCOLAX) packet Take 17 g by mouth daily. Patient taking differently: Take 17 g by mouth daily as needed (for constipation). Mix in 4-8 oz liquid and drink 09/08/16  Yes Vann, Jessica U, DO  Selenium Sulf-Pyrithione-Urea 2.25 % SHAM Apply 1 application topically See admin instructions. Apply topically to scalp for dermatitis once every 14 days 01/25/15  Yes [provider]  senna (SENOKOT) 8.6 MG tablet Take 1 tablet by mouth daily as needed for constipation.    Yes [provider]  sildenafil (REVATIO) 20 MG tablet Take 1 tablet (20 mg total) by mouth 3 (three) times daily. 06/14/17  Yes Regalado, Belkys A, MD  feeding supplement (BOOST / RESOURCE BREEZE) LIQD Take 1 Container by  mouth 3 (three) times daily between meals. Patient not taking: Reported on 02/24/2019 07/13/17   Mariel Aloe, MD  glucose blood (ACCU-CHEK AVIVA) test strip 1 each by Other route 2 (two) times daily. 08/04/16   Jearld Fenton, NP  Respiratory Therapy Supplies (FLUTTER) DEVI Use as directed. 05/26/17   Parrett, Fonnie Mu, NP   Allergies  Allergen Reactions  . Codeine Nausea And Vomiting  . Nitrostat [Nitroglycerin] Other (See Comments)    Pt is on revatio  . Prednisone Other (See Comments)    Has to monitor because she is a diabetic    Review of Systems  Unable to perform ROS: Severe respiratory distress    Physical Exam Vitals signs and nursing note reviewed.  Constitutional:      Appearance: She is toxic-appearing.     Comments: lethargic  HENT:     Head: Normocephalic and atraumatic.  Neck:     Musculoskeletal: Normal range of motion.  Cardiovascular:     Rate and Rhythm: Tachycardia present.  Pulmonary:     Effort: Respiratory  distress present.     Comments: Patient wearing nonrebreather Usage of accessory muscles noted Increased work of breathing at rest Musculoskeletal: Normal range of motion.  Skin:    General: Skin is warm and dry.  Neurological:     Comments: Lethargic but awakens easily; oriented x3  Psychiatric:     Comments: Unable to test No overt agitation     Vital Signs: BP 100/87   Pulse 85   Temp (!) 97.5 F (36.4 C)   Resp 14   Ht _0  (1.6 m)   Wt 91.2 kg   SpO2 95%   BMI 35.61 kg/m  Pain Scale: 0-10   Pain Score: 0-No pain   SpO2: SpO2: 95 % O2 Device:SpO2: 95 % O2 Flow Rate: .O2 Flow Rate (L/min): 15 L/min  IO: Intake/output summary:   Intake/Output Summary (Last 24 hours) at 02/25/2019 1026 Last data filed at 02/25/2019 0700 Gross per 24 hour  Intake 400 ml  Output 380 ml  Net 20 ml    LBM: Last BM Date: 02/25/19 Baseline Weight: Weight: 87.1 kg Most recent weight: Weight: 91.2 kg     Palliative Assessment/Data:   Flowsheet Rows     Most Recent Value  Intake Tab  Referral Department  Hospitalist  Unit at Time of Referral  Med/Surg Unit  Palliative Care Primary Diagnosis  Pulmonary  Date Notified  02/24/19  Palliative Care Type  Return patient Palliative Care  Reason for referral  Non-pain Symptom  Date of Admission  02/24/19  Date first seen by Palliative Care  02/25/19  # of days Palliative referral response time  1 Day(s)  # of days IP prior to Palliative referral  0  Clinical Assessment  Palliative Performance Scale Score  40%  Pain Max last 24 hours  Not able to report  Pain Min Last 24 hours  Not able to report  Dyspnea Max Last 24 Hours  Not able to report  Dyspnea Min Last 24 hours  Not able to report  Nausea Max Last 24 Hours  Not able to report  Nausea Min Last 24 Hours  Not able to report  Anxiety Max Last 24 Hours  Not able to report  Anxiety Min Last 24 Hours  Not able to report  Other Max Last 24 Hours  Not able to report  Psychosocial  & Spiritual Assessment  Palliative Care Outcomes  Patient/Family meeting held?  Yes  Who was at the meeting?  pt, and HCPOA  Patient/Family wishes: Interventions discontinued/not started   Trach, Mechanical Ventilation  Palliative Care follow-up planned  Yes, Facility      Time In: 0900 Time Out: 1010 Time Total: 70 min Greater than 50%  of this time was spent counseling and coordinating care related to the above assessment and plan. Staffed with Dr. Earnest Conroy  Signed by: Dory Horn, NP   Please contact Palliative Medicine Team phone at [phone] for questions and concerns.  For individual provider: See Shea Evans

## 2019-02-25 NOTE — Progress Notes (Signed)
CRITICAL VALUE ALERT  Critical Value:  Sodium 119  Date & Time Notied:  02/25/2019 _0   Provider Notified: Dr Earnest Conroy via text page

## 2019-02-25 NOTE — Progress Notes (Addendum)
PROGRESS NOTE    Elizabeth Thompson  LTJ:[id number]  DOB: May 01, 1951  DOA: 02/24/2019 PCP: Everardo Beals, NP  Brief Narrative:  68 y.o. female with medical history significant of Polar disorder, pulmonary hypertension with COPD on 6 L nasal cannula at baseline, diabetes type 2 who presented with worsening shortness of breath and a nonproductive cough associated with hypoxia as noted on home pulse oximetry (patient usually has O2 sats in the 70s but sometimes drops to even 50% improving with positional change according to niece who is a retired Marine scientist).  Follows up with pulmonary (Dr. Melvyn Novas) for her lung issues and just finished a course of prednisone taper about a month ago.  She also had a negative VQ scan a month back. She was discharged from the hospital on January 29th after an approximate 10-day stay.    Patient received continuous neb treatments in the ED with 125 mg IV Solu-Medrol as well as 2 g magnesium.  Initially she required BiPAP but was able to transition to 15 L nasal cannula.  She was subsequently evaluated by pulmonary in consultation who felt her symptoms could be related to CHF and started her on IV diuretics 80 mg IV twice daily.  Admission labs showed sodium of 122 (labs from February show sodium of 135)  Subjective:  Patient still complaining of dyspnea, trying to eat and noted to be on 100% NRBM.  Niece bedside reports that patient was scheduled to see nephrology as outpatient for progressive kidney disease.  She states patient had hyponatremia in the past in the setting of IV Lasix. Hemoglobin 7.5 on presentation, dropped to 6.9 on labs this morning.   Objective: Vitals:   02/25/19 1136 02/25/19 1313 02/25/19 1325 02/25/19 1352  BP: 123/80   (!) 84/58  Pulse: 75     Resp: _0 Temp: 98.5 F (36.9 C)   98.4 F (36.9 C)  TempSrc: Oral   Oral  SpO2: 97% 95% 100%   Weight:      Height:        Intake/Output Summary (Last 24 hours) at 02/25/2019 1555 Last data  filed at 02/25/2019 1352 Gross per 24 hour  Intake 702 ml  Output 580 ml  Net 122 ml   Filed Weights   02/24/19 1155 02/24/19 1600 02/25/19 0431  Weight: 87.1 kg 93.4 kg 91.2 kg    Physical Examination:  General exam: Appears dyspneic while talking full sentences, 100% NRB M Respiratory system: Distant breath sounds, no crepitations or wheezing on exam.  Mild respiratory distress while talking full sentences. Cardiovascular system: S1 & S2 heard, RRR. No JVD, murmurs, rubs, gallops or clicks. No pedal edema. Gastrointestinal system: Abdomen is nondistended, soft and nontender. No organomegaly or masses felt. Normal bowel sounds heard. Central nervous system: Alert and oriented x 2. No focal neurological deficits. Extremities: Symmetric, moving all extremities Skin: No rashes, lesions or ulcers Psychiatry: Judgement and insight appear normal. Mood & affect appropriate.     Data Reviewed: I have personally reviewed following labs and imaging studies  CBC: Recent Labs  Lab 02/24/19 1150 02/24/19 1201 02/24/19 1450 02/25/19 0630  WBC 9.6  --  10.7* 5.6  NEUTROABS 7.5  --   --   --   HGB 7.5* 8.8* 7.6* 6.9*  HCT 25.4* 26.0* 24.5* 22.8*  MCV 78.4*  --  78.0* 78.1*  PLT 556*  --  543* 076*   Basic Metabolic Panel: Recent Labs  Lab 02/24/19 1150 02/24/19 1201 02/24/19  1450 02/25/19 0630  NA 122* 120*  --  119*  K 4.6 4.7  --  4.7  CL 89*  --   --  86*  CO2 21*  --   --  19*  GLUCOSE 86  --   --  209*  BUN 20  --   --  28*  CREATININE 1.96*  --  2.07* 2.22*  CALCIUM 9.1  --   --  9.0   GFR: Estimated Creatinine Clearance: 26 mL/min (A) (by C-G formula based on SCr of 2.22 mg/dL (H)). Liver Function Tests: Recent Labs  Lab 02/24/19 1150  AST 40  ALT 19  ALKPHOS 53  BILITOT 0.6  PROT 7.1  ALBUMIN 3.9   No results for input(s): LIPASE, AMYLASE in the last 168 hours. No results for input(s): AMMONIA in the last 168 hours. Coagulation Profile: No results for  input(s): INR, PROTIME in the last 168 hours. Cardiac Enzymes: No results for input(s): CKTOTAL, CKMB, CKMBINDEX, TROPONINI in the last 168 hours. BNP (last 3 results) No results for input(s): PROBNP in the last 8760 hours. HbA1C: Recent Labs    02/24/19 1500  HGBA1C 6.5*   CBG: Recent Labs  Lab 02/24/19 1701 02/24/19 2132 02/25/19 0332 02/25/19 0921 02/25/19 1226  GLUCAP 169* 262* 299* 144* 76   Lipid Profile: No results for input(s): CHOL, HDL, LDLCALC, TRIG, CHOLHDL, LDLDIRECT in the last 72 hours. Thyroid Function Tests: No results for input(s): TSH, T4TOTAL, FREET4, T3FREE, THYROIDAB in the last 72 hours. Anemia Panel: No results for input(s): VITAMINB12, FOLATE, FERRITIN, TIBC, IRON, RETICCTPCT in the last 72 hours. Sepsis Labs: No results for input(s): PROCALCITON, LATICACIDVEN in the last 168 hours.  Recent Results (from the past 240 hour(s))  Respiratory Panel by PCR     Status: None   Collection Time: 02/24/19 11:50 AM  Result Value Ref Range Status   Adenovirus NOT DETECTED NOT DETECTED Final   Coronavirus 229E NOT DETECTED NOT DETECTED Final    Comment: (NOTE) The Coronavirus on the Respiratory Panel, DOES NOT test for the novel  Coronavirus (2019 nCoV)    Coronavirus HKU1 NOT DETECTED NOT DETECTED Final   Coronavirus NL63 NOT DETECTED NOT DETECTED Final   Coronavirus OC43 NOT DETECTED NOT DETECTED Final   Metapneumovirus NOT DETECTED NOT DETECTED Final   Rhinovirus / Enterovirus NOT DETECTED NOT DETECTED Final   Influenza A NOT DETECTED NOT DETECTED Final   Influenza B NOT DETECTED NOT DETECTED Final   Parainfluenza Virus 1 NOT DETECTED NOT DETECTED Final   Parainfluenza Virus 2 NOT DETECTED NOT DETECTED Final   Parainfluenza Virus 3 NOT DETECTED NOT DETECTED Final   Parainfluenza Virus 4 NOT DETECTED NOT DETECTED Final   Respiratory Syncytial Virus NOT DETECTED NOT DETECTED Final   Bordetella pertussis NOT DETECTED NOT DETECTED Final   Chlamydophila  pneumoniae NOT DETECTED NOT DETECTED Final   Mycoplasma pneumoniae NOT DETECTED NOT DETECTED Final    Comment: Performed at [facility], [address] [place], [place] [address]  MRSA PCR Screening     Status: Abnormal   Collection Time: 02/24/19  6:35 PM  Result Value Ref Range Status   MRSA by PCR POSITIVE (A) NEGATIVE Final    Comment:        The GeneXpert MRSA Assay (FDA approved for NASAL specimens only), is one component of a comprehensive MRSA colonization surveillance program. It is not intended to diagnose MRSA infection nor to guide or monitor treatment for MRSA infections.  RESULT CALLED TO, READ BACK BY AND VERIFIED WITH: Marguerita Beards RN 2205 02/24/19 A BROWNING Performed at [facility], [place] [address] [place], [place] [address]       Radiology Studies: Dg Chest 2 View  Result Date: 02/25/2019 CLINICAL DATA:  Chronic bronchitis and COPD. EXAM: CHEST - 2 VIEW COMPARISON:  02/24/2019 FINDINGS: Cardiomegaly as seen previously. Slight improvement in interstitial lung density. Findings could represent a combination of bronchitis and interstitial edema. No consolidation, collapse or effusion. IMPRESSION: Radiographic improvement consistent with improving bronchitis and or interstitial edema. Electronically Signed   By: Nelson Chimes M.D.   On: 02/25/2019 10:43   Dg Chest Port 1 View  Result Date: 02/24/2019 CLINICAL DATA:  Shortness of breath for the past 4 days. EXAM: PORTABLE CHEST 1 VIEW COMPARISON:  01/24/2019. FINDINGS: Poor inspiration. No gross change in borderline enlargement of the cardiac silhouette. No Grace change in diffuse prominence of the interstitial markings with normal vasculature. No pleural fluid seen. Thoracic spine degenerative changes. IMPRESSION: No acute abnormality. Stable borderline cardiomegaly and moderate chronic interstitial lung disease when a decreased depth of inspiration is taken into account. Electronically Signed   By: Claudie Revering  M.D.   On: 02/24/2019 12:18        Scheduled Meds: . amLODipine  10 mg Oral Daily  . aspirin EC  81 mg Oral Daily  . atorvastatin  20 mg Oral QPC supper  . brimonidine  1 drop Both Eyes BID  . Chlorhexidine Gluconate Cloth  6 each Topical Q0600  . dorzolamide-timolol  1 drop Both Eyes BID  . enoxaparin (LOVENOX) injection  30 mg Subcutaneous Q24H  . feeding supplement (ENSURE ENLIVE)  237 mL Oral BID BM  . ferrous sulfate  325 mg Oral QPC supper  . glimepiride  2 mg Oral Q breakfast  . insulin aspart  0-20 Units Subcutaneous TID WC  . insulin aspart  0-5 Units Subcutaneous QHS  . insulin aspart  4 Units Subcutaneous TID WC  . ipratropium-albuterol  3 mL Nebulization Q6H  . lamoTRIgine  100 mg Oral QPM  . latanoprost  1 drop Both Eyes QHS  . macitentan  10 mg Oral Daily  . magnesium oxide  200 mg Oral QODAY  . mouth rinse  15 mL Mouth Rinse BID  . mometasone-formoterol  2 puff Inhalation BID  . mupirocin ointment  1 application Nasal BID  . [START ON 02/26/2019] polyethylene glycol  17 g Oral Daily  . sildenafil  20 mg Oral TID  . sodium chloride flush  3 mL Intravenous Q12H   Continuous Infusions: . sodium chloride    . sodium chloride      Assessment & Plan:    1.  Acute on chronic hypoxic respiratory failure: Present on admission.  Likely multifactorial secondary to ?COPD exacerbation with underlying interstitial/restrictive lung disease and pulmonary hypertension.  Per pulmonary note, patient has emphysema by radiology but had normal PFTs.  Suspected to have CHF by pulmonary but  BNP <500 and appears dry clinically.  Echo in January showed normal EF with stage I diastolic dysfunction. ?Aspiration.  Will resume Solu-Medrol, empiric antibiotics, continue nebs.  Patient uses 6 L nasal cannula at baseline.  VQ scan on February 12 showed very low probability for PE.  Venous blood gas on admission showed normal pH.  Will try and obtain arterial blood gas in a.m. not a candidate  for contrasted CT in view of CKD and not candidate for anticoagulation in  view of severe anemia of unclear etiology. Will obtain high resolution CT without contrast if she can tolerate. Overall poor prognosis given chronic hypoxia even with baseline O2 requirement of 6 L or more.  On sildenafil for pulmonary hypertension.  2.  Acute hyponatremia: Present on admission.  Patient's last known sodium level was within normal limits in February.  Patient on Lasix 80 mg p.o. twice daily at home.  She received 1 dose of IV Lasix yesterday and held this morning and concern for worsening hyponatremia.  Urine electrolytes pending.  Serum uric acid elevated at 9.2 indicating dehydration and ruling out SIADH.  Discussed with nephrology on-call Dr. Justin Mend who agreed with gentle hydration and will follow-up in a.m.  3.  Acute on chronic anemia: Unclear etiology, no active signs of bleeding.  Baseline hemoglobin around 9.  Will obtain iron studies.  Transfuse 2 units of PRBC and obtain posttransfusion hemoglobin.  4.  Diabetes mellitus type 2: Continue glimepiride, hold metformin in view of problem #5.  Continue pre-meal insulin, sliding scale insulin.  Watch blood glucose on steroids.  5.  Chronic kidney disease stage IV: Patient's serum creatinine stable around 2.2 (baseline appears to be around 1.8-2.5).  Will hold IV diuretics/ACE inhibitor/metformin for now  6.  Hypertension: Hold Norvasc given hypotension in the setting of IV diuresis.  Hold diuretics in view of #2.  Watch blood pressure with mild IV hydration  7.  Bipolar disorder/dementia: Will hold Abilify/Aricept for now and resume other medications.  8.  Goals of care: Discussed at length with patient's niece bedside who is the primary caregiver and also a retired Marine scientist.  She states patient was in the process of seeing nephrology and cardiology Dr. Einar Gip as outpatient and requesting evaluations while here if possible.  She understands overall poor prognosis.   She met palliative care nurse earlier in the day and finalized the [place] as DNR.  Ongoing discussions about withdrawal of care with palliative team as clinical course determines over the next few days.  DVT prophylaxis: Hold Lovenox with anemia.  Will order SCDs Code Status: DNR Disposition Plan: To be decided     LOS: 1 day    Time spent: 45 minutes    Guilford Shi, MD [facility] Pager [phone]  If 7PM-7AM, please contact night-coverage [email or web address] Password [facility] 02/25/2019, 3:55 PM

## 2019-02-25 NOTE — Evaluation (Signed)
Occupational Therapy Evaluation Patient Details Name: Elizabeth Thompson MRN: [id number] DOB: 1951/07/02 Today's Date: 02/25/2019    History of Present Illness  Elizabeth Thompson is a 68 y.o. female with medical history significant of bipolar disorder, pulmonary hypertension with COPD on 6 L nasal cannula at baseline, diabetes type 2 who presented with worsening shortness of breath and a nonproductive cough.  Work up includes acute on chronic hypoxic respiratory failure in the setting of decompensated pulmonary HTN and diastolic dysfunction with pulmonary edema.   Clinical Impression   Pt admitted with pulmonary HTN and diastolic dysfunction with pulmonary edema. Pt currently with functional limitations due to the deficits listed below (see OT Problem List). The patient's goal at this time is to return home with the niece who can assist as needed. The patient at this time is limited to complete ADLS due to decrease activity tolerance.  Pt will benefit from skilled OT to increase their safety and independence with ADL and functional mobility for ADL to facilitate discharge to venue listed below.       Follow Up Recommendations  Home health OT;Supervision/Assistance - 24 hour    Equipment Recommendations       Recommendations for Other Services       Precautions / Restrictions Precautions Precautions: Fall Restrictions Weight Bearing Restrictions: No      Mobility Bed Mobility Overal bed mobility: Modified Independent             General bed mobility comments: HOB elevated  Transfers Overall transfer level: Needs assistance   Transfers: Sit to/from Stand;Stand Pivot Transfers Sit to Stand: Min guard Stand pivot transfers: Min guard            Balance Overall balance assessment: Needs assistance   Sitting balance-Leahy Scale: Fair     Standing balance support: No upper extremity supported Standing balance-Leahy Scale: Fair                              ADL either performed or assessed with clinical judgement   ADL Overall ADL's : Needs assistance/impaired Eating/Feeding: Independent;Sitting   Grooming: Wash/dry hands;Sitting;Modified independent   Upper Body Bathing: Set up;Sitting   Lower Body Bathing: Min guard;Cueing for compensatory techniques;Sit to/from stand   Upper Body Dressing : Set up;Cueing for safety;Cueing for sequencing   Lower Body Dressing: Cueing for compensatory techniques;Cueing for safety;Sit to/from stand;Min guard   Toilet Transfer: Min guard;Comfort height toilet;Cueing for safety   Toileting- Water quality scientist and Hygiene: Min guard;Cueing for safety;Sit to/from stand   Tub/ Banker: Minimal assistance;Cueing for safety;Cueing for sequencing   Functional mobility during ADLs: Min guard;Cueing for safety;Cueing for sequencing       Vision Baseline Vision/History: Wears glasses Wears Glasses: Reading only Patient Visual Report: No change from baseline Vision Assessment?: No apparent visual deficits     Perception Perception Perception Tested?: No   Praxis Praxis Praxis tested?: Not tested    Pertinent Vitals/Pain Pain Assessment: No/denies pain     Hand Dominance Right   Extremity/Trunk Assessment Upper Extremity Assessment Upper Extremity Assessment: Defer to OT evaluation LUE Deficits / Details: weakness  LUE Sensation: WNL LUE Coordination: decreased fine motor   Lower Extremity Assessment Lower Extremity Assessment: Generalized weakness;Overall WFL for tasks assessed       Communication Communication Communication: No difficulties   Cognition Arousal/Alertness: Awake/alert Behavior During Therapy: WFL for tasks assessed/performed( a little impulsive or eager ) Overall Cognitive Status:  Within Functional Limits for tasks assessed(NT formally)                                     General Comments  Spo2 ON 15 L HFNC in upper 90's    Exercises      Shoulder Instructions      Home Living Family/patient expects to be discharged to:: Private residence Living Arrangements: Other relatives Available Help at Discharge: Family;Available 24 hours/day Type of Home: House Home Access: Stairs to enter [employer] of Steps: 2 on the side, and 4 in the front Entrance Stairs-Rails: None Home Layout: One level     Bathroom Shower/Tub: Teacher, early years/pre: Standard Bathroom Accessibility: Yes How Accessible: Accessible via walker Home Equipment: Shower seat;Walker - 4 wheels;Wheelchair - manual;Bedside commode;Grab bars - tub/shower   Additional Comments: Lives with Niece      Prior Functioning/Environment Level of Independence: Needs assistance  Gait / Transfers Assistance Needed: Uses the rollator for support ADL's / Homemaking Assistance Needed: Niece states pt does what she can until she gets fatigued and then niece helps her finish.             OT Problem List: Decreased strength;Decreased activity tolerance;Impaired balance (sitting and/or standing);Decreased safety awareness;Decreased knowledge of use of DME or AE      OT Treatment/Interventions: Self-care/ADL training;Therapeutic exercise;Energy conservation;DME and/or AE instruction;Therapeutic activities;Patient/family education    OT Goals(Current goals can be found in the care plan section) Acute Rehab OT Goals Patient Stated Goal: back home if at all possible. OT Goal Formulation: With patient Time For Goal Achievement: 03/04/19 Potential to Achieve Goals: Good ADL Goals Pt Will Perform Lower Body Bathing: with modified independence;sit to/from stand Pt Will Perform Lower Body Dressing: (P) with modified independence;sit to/from stand Pt Will Transfer to Toilet: (P) with modified independence;grab bars Additional ADL Goal #1: (P) Pt will complete a standing ADL for 5 min interval with stable o2 stats  OT Frequency: Min 2X/week    Barriers to D/C:            Co-evaluation   Reason for Co-Treatment: Complexity of the patient's impairments (multi-system involvement) PT goals addressed during session: Mobility/safety with mobility OT goals addressed during session: ADL's and self-care      AM-PAC OT "6 Clicks" Daily Activity     Outcome Measure Help from another person eating meals?: None Help from another person taking care of personal grooming?: A Little Help from another person toileting, which includes using toliet, bedpan, or urinal?: A Little Help from another person bathing (including washing, rinsing, drying)?: A Lot Help from another person to put on and taking off regular upper body clothing?: A Little Help from another person to put on and taking off regular lower body clothing?: A Lot 6 Click Score: 17   End of Session    Activity Tolerance: Patient tolerated treatment well;Patient limited by fatigue Patient left: in chair;with chair alarm set;with call bell/phone within reach;with family/visitor present  OT Visit Diagnosis: Unsteadiness on feet (R26.81);Muscle weakness (generalized) (M62.81)                Time: 5072-2575 OT Time Calculation (min): 23 min Charges:  OT General Charges $OT Visit: 1 Visit OT Evaluation $OT Eval Low Complexity: [place] OTR/L  Acute Rehab Services  [phone] office number [phone] pager number   Joeseph Amor 02/25/2019, 1:18  PM

## 2019-02-25 NOTE — Progress Notes (Signed)
CRITICAL VALUE ALERT  Critical Value:  HGB 6.9  Date & Time Notied:  02/25/19  Provider Notified: MD Earnest Conroy via text page

## 2019-02-26 ENCOUNTER — Inpatient Hospital Stay (HOSPITAL_COMMUNITY): Payer: Medicare Other

## 2019-02-26 LAB — BLOOD GAS, ARTERIAL
Acid-base deficit: 3.5 mmol/L — ABNORMAL HIGH (ref 0.0–2.0)
BICARBONATE: 22 mmol/L (ref 20.0–28.0)
Drawn by: 365291
O2 Content: 15 L/min
O2 Saturation: 97.1 %
PO2 ART: 97.3 mmHg (ref 83.0–108.0)
Patient temperature: 98.6
pCO2 arterial: 46.8 mmHg (ref 32.0–48.0)
pH, Arterial: 7.294 — ABNORMAL LOW (ref 7.350–7.450)

## 2019-02-26 LAB — BASIC METABOLIC PANEL
Anion gap: 13 (ref 5–15)
BUN: 40 mg/dL — ABNORMAL HIGH (ref 8–23)
CO2: 19 mmol/L — ABNORMAL LOW (ref 22–32)
Calcium: 8.6 mg/dL — ABNORMAL LOW (ref 8.9–10.3)
Chloride: 85 mmol/L — ABNORMAL LOW (ref 98–111)
Creatinine, Ser: 2.45 mg/dL — ABNORMAL HIGH (ref 0.44–1.00)
GFR calc Af Amer: 23 mL/min — ABNORMAL LOW (ref >60)
GFR calc non Af Amer: 20 mL/min — ABNORMAL LOW (ref >60)
Glucose, Bld: 153 mg/dL — ABNORMAL HIGH (ref 70–99)
POTASSIUM: 5.5 mmol/L — AB (ref 3.5–5.1)
Sodium: 117 mmol/L — CL (ref 135–145)

## 2019-02-26 LAB — BRAIN NATRIURETIC PEPTIDE: B Natriuretic Peptide: 547.8 pg/mL — ABNORMAL HIGH (ref 0.0–100.0)

## 2019-02-26 LAB — GLUCOSE, CAPILLARY
GLUCOSE-CAPILLARY: 186 mg/dL — AB (ref 70–99)
GLUCOSE-CAPILLARY: 103 mg/dL — AB (ref 70–99)
Glucose-Capillary: 138 mg/dL — ABNORMAL HIGH (ref 70–99)
Glucose-Capillary: 170 mg/dL — ABNORMAL HIGH (ref 70–99)

## 2019-02-26 LAB — CORTISOL-AM, BLOOD: Cortisol - AM: 5.2 ug/dL — ABNORMAL LOW (ref 6.7–22.6)

## 2019-02-26 LAB — TSH: TSH: 0.308 u[IU]/mL — ABNORMAL LOW (ref 0.350–4.500)

## 2019-02-26 LAB — CORTISOL: Cortisol, Plasma: 20.2 ug/dL

## 2019-02-26 MED ORDER — SODIUM ZIRCONIUM CYCLOSILICATE 10 G PO PACK
10.0000 g | PACK | Freq: Three times a day (TID) | ORAL | Status: DC
  Administered 2019-02-26 – 2019-02-27 (×4): 10 g via ORAL
  Filled 2019-02-26 (×5): qty 1

## 2019-02-26 MED ORDER — COSYNTROPIN 0.25 MG IJ SOLR
0.2500 mg | Freq: Once | INTRAMUSCULAR | Status: AC
  Administered 2019-02-26: 0.25 mg via INTRAVENOUS
  Filled 2019-02-26: qty 0.25

## 2019-02-26 MED ORDER — SODIUM CHLORIDE 0.9 % IV SOLN
125.0000 mg | Freq: Every day | INTRAVENOUS | Status: AC
  Administered 2019-02-26 – 2019-03-05 (×8): 125 mg via INTRAVENOUS
  Filled 2019-02-26 (×8): qty 10

## 2019-02-26 MED ORDER — SODIUM BICARBONATE 650 MG PO TABS
1300.0000 mg | ORAL_TABLET | Freq: Three times a day (TID) | ORAL | Status: DC
  Administered 2019-02-26 – 2019-03-09 (×33): 1300 mg via ORAL
  Filled 2019-02-26 (×33): qty 2

## 2019-02-26 MED ORDER — METHYLPREDNISOLONE SODIUM SUCC 40 MG IJ SOLR
40.0000 mg | Freq: Two times a day (BID) | INTRAMUSCULAR | Status: DC
  Administered 2019-02-27 – 2019-03-03 (×9): 40 mg via INTRAVENOUS
  Filled 2019-02-26 (×9): qty 1

## 2019-02-26 NOTE — Progress Notes (Signed)
  Palliative medicine progress note  Patient seen, chart reviewed.  Patient remains on 15 L, nonrebreather.  Niece, Jinny Sanders (healthcare POA) is at the bedside.  Patient has been seen by cardiology as well as nephrology. Patient's goals are hopeful and watchful waiting over the next 24 to 48 hours; treat the treatable but if patient were to continue to decline would pursue comfort  For now, care limits are DNR DNI  Assessment/Plan  Continue current treatment plan as outlined per cardiology, nephrology and primary attending As noted above, care limits are DNR/DNI for now  Palliative medicine to stay involved and help support patient and family as they contemplate further goals of care, specifically moving towards a more comfort based, symptom based approach as they see how pt responds to treatment  Thank you, Romona Curls, NP Time spent: 25 minutes Greater than 50% of time spent in counseling and coordination of care

## 2019-02-26 NOTE — Progress Notes (Signed)
PROGRESS NOTE    Elizabeth Thompson  HEN:[id number]  DOB: 14-Mar-1951  DOA: 02/24/2019 PCP: Everardo Beals, NP  Brief Narrative:  68 y.o. female with medical history significant of Bipolar disorder, pulmonary hypertension with COPD on 6 L nasal cannula at baseline, diabetes type 2 who presented with worsening shortness of breath and a nonproductive cough associated with hypoxia as noted on home pulse oximetry (patient usually has O2 sats in the 70s but sometimes drops to even 50% improving with positional change according to niece who is a retired [employer] scientist).  Follows up with pulmonary (Dr. Melvyn Novas) for her lung issues and just finished a course of prednisone taper about a month ago.  She also had a negative VQ scan a month back. She was discharged from the hospital on January 29th after an approximate 10-day stay.    Patient received continuous neb treatments in the ED with 125 mg IV Solu-Medrol as well as 2 g magnesium.  Initially she required BiPAP but was able to transition to 15 L nasal cannula.  She was subsequently evaluated by pulmonary in consultation who felt her symptoms could be related to CHF and started her on IV diuretics 80 mg IV twice daily.  Admission labs showed sodium of 122 (labs from February show sodium of 135)  Subjective:  Patient appears more awake alert and communicative today.  She is still on 100% NRB M but constantly moving it off her face to take medications and to eat.  Objective: Vitals:   02/26/19 0449 02/26/19 0730 02/26/19 0736 02/26/19 1158  BP: 123/75     Pulse: 75     Resp: 11     Temp: 98 F (36.7 C) 97.8 F (36.6 C)  98.7 F (37.1 C)  TempSrc: Oral Axillary  Axillary  SpO2: 100%  100%   Weight:      Height:        Intake/Output Summary (Last 24 hours) at 02/26/2019 1354 Last data filed at 02/26/2019 1004 Gross per 24 hour  Intake 322.63 ml  Output 350 ml  Net -27.37 ml   Filed Weights   02/24/19 1155 02/24/19 1600 02/25/19 0431  Weight: 87.1  kg 93.4 kg 91.2 kg    Physical Examination:  General exam: Appears dyspneic while talking full sentences, 100% NRB M Respiratory system: Distant breath sounds, no crepitations or wheezing on exam.  Mild respiratory distress while talking full sentences. Cardiovascular system: S1 & S2 heard, RRR. No JVD, murmurs, rubs, gallops or clicks. No pedal edema. Gastrointestinal system: Abdomen is nondistended, soft and nontender. No organomegaly or masses felt. Normal bowel sounds heard. Central nervous system: Alert and oriented x 2. No focal neurological deficits. Extremities: Symmetric, moving all extremities Skin: No rashes, lesions or ulcers Psychiatry: Judgement and insight appear normal. Mood & affect appropriate.     Data Reviewed: I have personally reviewed following labs and imaging studies  CBC: Recent Labs  Lab 02/24/19 1150 02/24/19 1201 02/24/19 1450 02/25/19 0630 02/25/19 1550  WBC 9.6  --  10.7* 5.6  --   NEUTROABS 7.5  --   --   --   --   HGB 7.5* 8.8* 7.6* 6.9* 7.7*  HCT 25.4* 26.0* 24.5* 22.8* 25.1*  MCV 78.4*  --  78.0* 78.1*  --   PLT 556*  --  543* 548*  --    Basic Metabolic Panel: Recent Labs  Lab 02/24/19 1150 02/24/19 1201 02/24/19 1450 02/25/19 0630 02/25/19 2052 02/26/19 0813  NA 122* 120*  --  119* 118* 117*  K 4.6 4.7  --  4.7 4.8 5.5*  CL 89*  --   --  86* 87* 85*  CO2 21*  --   --  19* 19* 19*  GLUCOSE 86  --   --  209* 84 153*  BUN 20  --   --  28* 34* 40*  CREATININE 1.96*  --  2.07* 2.22* 2.42* 2.45*  CALCIUM 9.1  --   --  9.0 8.8* 8.6*   GFR: Estimated Creatinine Clearance: 23.6 mL/min (A) (by C-G formula based on SCr of 2.45 mg/dL (H)). Liver Function Tests: Recent Labs  Lab 02/24/19 1150  AST 40  ALT 19  ALKPHOS 53  BILITOT 0.6  PROT 7.1  ALBUMIN 3.9   No results for input(s): LIPASE, AMYLASE in the last 168 hours. No results for input(s): AMMONIA in the last 168 hours. Coagulation Profile: No results for input(s): INR,  PROTIME in the last 168 hours. Cardiac Enzymes: No results for input(s): CKTOTAL, CKMB, CKMBINDEX, TROPONINI in the last 168 hours. BNP (last 3 results) No results for input(s): PROBNP in the last 8760 hours. HbA1C: Recent Labs    02/24/19 1500  HGBA1C 6.5*   CBG: Recent Labs  Lab 02/25/19 1226 02/25/19 1735 02/25/19 2142 02/26/19 0834 02/26/19 1241  GLUCAP 76 98 84 138* 186*   Lipid Profile: No results for input(s): CHOL, HDL, LDLCALC, TRIG, CHOLHDL, LDLDIRECT in the last 72 hours. Thyroid Function Tests: No results for input(s): TSH, T4TOTAL, FREET4, T3FREE, THYROIDAB in the last 72 hours. Anemia Panel: Recent Labs    02/25/19 1758  VITAMINB12 1,459*  FOLATE 36.1  FERRITIN 26  TIBC 490*  IRON 31  RETICCTPCT 3.8*   Sepsis Labs: No results for input(s): PROCALCITON, LATICACIDVEN in the last 168 hours.  Recent Results (from the past 240 hour(s))  Respiratory Panel by PCR     Status: None   Collection Time: 02/24/19 11:50 AM  Result Value Ref Range Status   Adenovirus NOT DETECTED NOT DETECTED Final   Coronavirus 229E NOT DETECTED NOT DETECTED Final    Comment: (NOTE) The Coronavirus on the Respiratory Panel, DOES NOT test for the novel  Coronavirus (2019 nCoV)    Coronavirus HKU1 NOT DETECTED NOT DETECTED Final   Coronavirus NL63 NOT DETECTED NOT DETECTED Final   Coronavirus OC43 NOT DETECTED NOT DETECTED Final   Metapneumovirus NOT DETECTED NOT DETECTED Final   Rhinovirus / Enterovirus NOT DETECTED NOT DETECTED Final   Influenza A NOT DETECTED NOT DETECTED Final   Influenza B NOT DETECTED NOT DETECTED Final   Parainfluenza Virus 1 NOT DETECTED NOT DETECTED Final   Parainfluenza Virus 2 NOT DETECTED NOT DETECTED Final   Parainfluenza Virus 3 NOT DETECTED NOT DETECTED Final   Parainfluenza Virus 4 NOT DETECTED NOT DETECTED Final   Respiratory Syncytial Virus NOT DETECTED NOT DETECTED Final   Bordetella pertussis NOT DETECTED NOT DETECTED Final    Chlamydophila pneumoniae NOT DETECTED NOT DETECTED Final   Mycoplasma pneumoniae NOT DETECTED NOT DETECTED Final    Comment: Performed at [facility], [address], [place], [place] [address]  MRSA PCR Screening     Status: Abnormal   Collection Time: 02/24/19  6:35 PM  Result Value Ref Range Status   MRSA by PCR POSITIVE (A) NEGATIVE Final    Comment:        The GeneXpert MRSA Assay (FDA approved for NASAL specimens only), is one component of a comprehensive MRSA colonization surveillance  program. It is not intended to diagnose MRSA infection nor to guide or monitor treatment for MRSA infections. RESULT CALLED TO, READ BACK BY AND VERIFIED WITH: Marguerita Beards RN 2205 02/24/19 A BROWNING Performed at [facility], [place] [address], [place], [place] [address]       Radiology Studies: Dg Chest 2 View  Result Date: 02/25/2019 CLINICAL DATA:  Chronic bronchitis and COPD. EXAM: CHEST - 2 VIEW COMPARISON:  02/24/2019 FINDINGS: Cardiomegaly as seen previously. Slight improvement in interstitial lung density. Findings could represent a combination of bronchitis and interstitial edema. No consolidation, collapse or effusion. IMPRESSION: Radiographic improvement consistent with improving bronchitis and or interstitial edema. Electronically Signed   By: Nelson Chimes M.D.   On: 02/25/2019 10:43        Scheduled Meds: . amoxicillin-clavulanate  1 tablet Oral BID WC  . aspirin EC  81 mg Oral Daily  . atorvastatin  20 mg Oral QPC supper  . brimonidine  1 drop Both Eyes BID  . Chlorhexidine Gluconate Cloth  6 each Topical Q0600  . cosyntropin  0.25 mg Intravenous Once  . dorzolamide-timolol  1 drop Both Eyes BID  . enoxaparin (LOVENOX) injection  30 mg Subcutaneous Q24H  . feeding supplement (ENSURE ENLIVE)  237 mL Oral BID BM  . ferrous sulfate  325 mg Oral QPC supper  . furosemide  80 mg Intravenous Q8H  . glimepiride  2 mg Oral Q breakfast  . insulin aspart  0-20 Units  Subcutaneous TID WC  . insulin aspart  0-5 Units Subcutaneous QHS  . insulin aspart  4 Units Subcutaneous TID WC  . ipratropium-albuterol  3 mL Nebulization Q6H  . lamoTRIgine  100 mg Oral QPM  . latanoprost  1 drop Both Eyes QHS  . macitentan  10 mg Oral Daily  . magnesium oxide  200 mg Oral QODAY  . mouth rinse  15 mL Mouth Rinse BID  . methylPREDNISolone (SOLU-MEDROL) injection  40 mg Intravenous Q6H  . mometasone-formoterol  2 puff Inhalation BID  . mupirocin ointment  1 application Nasal BID  . polyethylene glycol  17 g Oral Daily  . sildenafil  20 mg Oral TID  . sodium bicarbonate  1,300 mg Oral TID  . sodium chloride flush  3 mL Intravenous Q12H  . sodium zirconium cyclosilicate  10 g Oral TID   Continuous Infusions: . sodium chloride    . ferric gluconate (FERRLECIT/NULECIT) IV      Assessment & Plan:    1.  Acute on chronic hypoxic respiratory failure: Present on admission.  Likely multifactorial secondary to CHF/COPD exacerbation with underlying interstitial/restrictive lung disease and pulmonary hypertension.?Aspiration.  Seen by cardiology and started on 80 mg IV Lasix 3 times daily for pulmonary edema.  Repeat BNP elevated at 547. On sildenafil for pulmonary hypertension.    Per pulmonary note, patient has emphysema by radiology but had normal PFTs.  Ordered high-resolution CT for better evaluation.Patient uses 6 L nasal cannula at baseline.   Will request RT to titrate O2 as tolerated and possibly transition to high flow as patient constantly moving 100% facemask to eat and to take medications.  Taper steroids, on empiric antibiotics, continue nebs.   VQ scan on February 12 showed very low probability for PE.  Venous blood gas on admission showed normal pH.  Not a candidate for contrasted CT in view of CKD and not candidate for anticoagulation in view of severe anemia of unclear etiology.  Overall poor prognosis given  chronic hypoxia even with baseline O2 requirement of 6 L  or more.   2.  Acute hyponatremia: Present on admission.  Continues to downtrend.  She received mild IV hydration yesterday.  Subsequently she was seen by cardiology and nephrology and currently on IV diuresis for presumed hypervolemic hyponatremia.  Patient's last known sodium level was within normal limits in February.  Patient on Lasix 80 mg p.o. twice daily at home. Urine sodium at 10 and urine potassium at 19..  Serum uric acid elevated at 9.2, ruling out SIADH.  Given hyponatremia, hyperkalemia and acidosis with hypotension, checked cortisol level, which resulted low at 5 but not very reliable as patient been on steroids.  Will follow-up cosyntropin stim results.  3.  Acute on chronic anemia: Unclear etiology, no active signs of bleeding.  Baseline hemoglobin around 9. Status post transfusion of  2 units of PRBC and  posttransfusion hemoglobin at 7.7 yesterday.  Will repeat in am.  Show iron level of 31, TIBC of 490 and ferritin of 26.  IV iron ordered by nephrology  4.  Acute on chronic combined CHF: Now on IV Lasix.  Monitor daily weights, inputs and outputs.  No leg edema on exam but elevated BNP and chest x-ray suggestive of pulmonary edema.  Patient has severe pulmonary hypertension/right heart failure at baseline. Echo in January showed normal EF with stage I diastolic dysfunction.  Appreciate cardiology evaluation and follow-up  5.  Chronic kidney disease stage IV: Patient's serum creatinine stable around 2.2 (baseline appears to be around 1.8-2.5).  On IV diuretics.  Will hold ACE inhibitor/metformin for now.  Nephrology following and renal ultrasound ordered.  6.  Hypertension: Hold Norvasc as patient was hypotensive yesterday.  Continue to monitor blood pressure on IV diuretics.  7.  Bipolar disorder/dementia: Holding Abilify/Aricept for now.  Resume once sodium level comes up and if mental status continues to improve.  8.  Diabetes mellitus type 2: Continue glimepiride, hold metformin  in view of problem #5.  Continue pre-meal insulin, sliding scale insulin.  Watch blood glucose on steroids.  9. Goals of care: Discussed at length with patient's niece bedside who is the primary caregiver and also a retired [employer] scientist.   She understands overall poor prognosis.  She met palliative care and finalized the CODE STATUS as DNR.  Ongoing discussions about withdrawal of care with palliative team as clinical course determines over the next few days.  DVT prophylaxis: Hold Lovenox with anemia.  Will order SCDs Code Status: DNR Disposition Plan: To be decided     LOS: 2 days    Time spent: 45 minutes    Guilford Shi, MD [facility] Pager [phone]  If 7PM-7AM, please contact night-coverage [email or web address] Password [facility] 02/26/2019, 1:54 PM

## 2019-02-26 NOTE — Progress Notes (Signed)
Cortisol injection given per order, notified phlebotomy  that labs can be drawn at this time.  AKingBSNRN

## 2019-02-26 NOTE — Progress Notes (Signed)
Per lab, pt needs to be NPO for this lab to be drawn.  Will notify MD  [email or web address]

## 2019-02-26 NOTE — Progress Notes (Signed)
Advised MD of need for NPO for ACTH labs.  MD would like to proceed with labs as ordered and advised lab to note time period after Cortisol given in results.  Brandy in lab to consult with lab supervisor and follow up with Margaretmary Bayley RN @ [phone] who has assumed care of pt at this time.  AKingBSNRN

## 2019-02-26 NOTE — Consult Note (Addendum)
Referring Provider: No ref. provider found Primary Care Physician:  Elizabeth Beals, NP Primary Nephrologist:    Reason for Consultation: Hyponatremia  HPI: This is a very pleasant 68 year old lady who is a history of pulmonary arterial hypertension.  She also has a history of a left superior pole renal cell carcinoma diagnosed in July 2017 and treated with cryoablation she had a new lesion in 18 September 2016 also requiring ablation.  She has a history of severe COPD she currently uses oxygen she is diabetes mellitus hypertension hyperlipidemia and gastroesophageal reflux disease.  She underwent chest x-ray and a CT scan with high resolution.  She had a urine sodium of 10.  In January 2020 her serum sodium was 131-135 the low normal range  She had been admitted in January 2020 with acute on chronic dyspnea worsening shortness of breath for 3 weeks cough with greenish sputum production.  VQ scan was very low probability for pulmonary embolus pneumonia was thought to be less likely antibiotics discontinued 2D echo showed diastolic dysfunction she is managed with IV diuretics for acute on chronic diastolic heart failure and transition back to oral Lasix.  She was concerning for obstructive sleep apnea due to hypoxia on the blood gas.  She was discharged on prednisone taper over 7 days.  Home medications albuterol 2 puffs every 6 hours, amlodipine 10 mg daily Abilify 20 mg daily aspirin 81 mg daily atorvastatin 20 mg daily, Symbicort inhaler 2 puffs twice daily iron sat 5 mg nightly iron sulfate 325 mg daily Lasix 80 mg a day, glipizide 2 mg a day Lamictal 100 mg nightly Combivent inhaler 1 to 2 puffs every 4 hours, metformin 1 g daily, Selindril 20 mg 3 times daily    Medications in hospital   Augmentin 500/125 mg 1 tablet daily, atorvastatin 20 mg daily, Lasix IV 80 mg every 8 hours, OPSUMIT 10 mg daily first dose 02/24/2019 sildenafil 20 mg 3 times daily, Amaryl 2 mg daily, methylprednisolone 40 mg IV  every 6 hours, iron sulfate 325 mg daily Lovenox every 24 hours, albuterol nebulizer 2.5 mg as needed DuoNeb nebulizer every 6 hours Dulera 2 puffs twice daily  Sodium 117 potassium 5.5 chloride 85 CO2 19 glucose 153 BUN 40 creatinine 2.45 calcium 8.6 iron sat 6%, urine sodium less than 10 HIV antibody negative, WBC 10.7 hemoglobin 7.6 platelets 543  Her a.m. cortisol was only 5.2 this is borderline low.  Although she was taking Solu-Medrol  Sodium 117 potassium 5.5 chloride 85 CO2 19 BUN 40 creatinine 2.45 glucose 153 calcium 2.45 GFR about 20 cc/min   02/25/2019 that showed radiographic improvement of bronchitis and interstitial edema.  Is also have high resolution CT scan and not available at this time.  Past Medical History:  Diagnosis Date  . Acute respiratory failure ([place]) 06/09/2017  . Arthritis   . Back pain   . Bell's palsy   . Bipolar disorder ([place])   . Bronchitis   . Chronic low back pain 05/09/2015  . COPD (chronic obstructive pulmonary disease) ([place])    hyoxia during sleep- using O2 as needed  . Cough with expectoration 05/21/16   recent diagnosis of bronchitis  . Cyst of right kidney   . Diabetes mellitus without complication ([place])    type 2  . Frequency of urination   . GERD (gastroesophageal reflux disease)   . Glaucoma   . History of blood transfusion   . History of colon polyps   . History of hiatal hernia   .  History of tobacco use   . Hypercalcemia   . Hyperlipidemia   . Hypertension   . Memory disorder 09/05/2014  . On home oxygen therapy    3L/M [place] at night   . Schizo-affective psychosis ([place])   . Shortness of breath dyspnea    exertion, or with out oxygen  . Uterine fibroid     Past Surgical History:  Procedure Laterality Date  . ABDOMINAL HYSTERECTOMY    . APPENDECTOMY    . COLONOSCOPY W/ POLYPECTOMY    . IR GENERIC HISTORICAL  04/29/2016   IR RADIOLOGIST EVAL & MGMT 04/29/2016 Corrie Mckusick, DO [facility] INTER[facility]  . IR GENERIC HISTORICAL  10/01/2016    IR RADIOLOGIST EVAL & MGMT 10/01/2016 [facility]  . IR GENERIC HISTORICAL  11/11/2016   IR RADIOLOGIST EVAL & MGMT 11/11/2016 Corrie Mckusick, DO [facility]  . IR GENERIC HISTORICAL  07/16/2016   IR RADIOLOGIST EVAL & MGMT 07/16/2016 Corrie Mckusick, DO [facility]  . IR RADIOLOGIST EVAL & MGMT  03/10/2017  . IR RADIOLOGIST EVAL & MGMT  05/13/2017  . IR RADIOLOGIST EVAL & MGMT  04/27/2018  . LUMBAR LAMINECTOMY/DECOMPRESSION MICRODISCECTOMY N/A 08/14/2015   Procedure: LUMBAR DECOMPRESSION MICRODISCECTOMY L3-S1;  Surgeon: Melina Schools, MD;  Location: [facility];  Service: Orthopedics;  Laterality: N/A;  . RADIOFREQUENCY ABLATION Left 02/05/2017   Procedure: LEFT RENAL CRYOABLATION;  Surgeon: Corrie Mckusick, DO;  Location: [facility];  Service: Anesthesiology;  Laterality: Left;  . RIGHT HEART CATH N/A 07/20/2017   Procedure: Right Heart Cath;  Surgeon: Nigel Mormon, MD;  Location: [facility];  Service: Cardiovascular;  Laterality: N/A;  . RIGHT/LEFT HEART CATH AND CORONARY ANGIOGRAPHY N/A 06/11/2017   Procedure: Right/Left Heart Cath and Coronary Angiography;  Surgeon: Dixie Dials, MD;  Location: [facility];  Service: Cardiovascular;  Laterality: N/A;  . TONSILLECTOMY AND ADENOIDECTOMY      Prior to Admission medications   Medication Sig Start Date End Date Taking? Authorizing Provider  acetaminophen (TYLENOL) 650 MG CR tablet Take 1,300 mg by mouth every 12 (twelve) hours as needed for pain.    Yes [provider]  albuterol (PROAIR HFA) 108 (90 Base) MCG/ACT inhaler Inhale 2 puffs into the lungs every 6 (six) hours as needed for wheezing or shortness of breath.   Yes [provider]  albuterol (PROVENTIL) (2.5 MG/3ML) 0.083% nebulizer solution Take 2.5 mg by nebulization every 4 (four) hours as needed for wheezing or shortness of breath.    Yes [provider]  amLODipine (NORVASC) 10 MG tablet Take 1 tablet (10 mg total) by mouth daily. 01/12/19   Yes Shelly Coss, MD  ARIPiprazole (ABILIFY) 20 MG tablet Take 1 tablet (20 mg total) by mouth at bedtime. 08/27/14  Yes Kerrie Buffalo, NP  ASPERCREME LIDOCAINE EX Apply 1 application topically 2 (two) times daily as needed (back pain).   Yes [provider]  aspirin EC 81 MG EC tablet Take 1 tablet (81 mg total) by mouth daily. 06/15/17  Yes Regalado, Belkys A, MD  atorvastatin (LIPITOR) 20 MG tablet Take 20 mg by mouth daily after supper.   Yes [provider]  bisacodyl (DULCOLAX) 10 MG suppository Place 1 suppository (10 mg total) rectally daily as needed for moderate constipation. 09/08/16  Yes Vann, Jessica U, DO  brimonidine (ALPHAGAN) 0.2 % ophthalmic solution Place 1 drop into both eyes 2 (two) times daily. 02/09/19  Yes [provider]  budesonide-formoterol (SYMBICORT) 160-4.5  MCG/ACT inhaler Inhale 2 puffs into the lungs 2 (two) times daily. 01/23/19  Yes Tanda Rockers, MD  donepezil (ARICEPT) 5 MG tablet Take 1 tablet (5 mg total) by mouth at bedtime. 08/27/14  Yes Kerrie Buffalo, NP  dorzolamide-timolol (COSOPT) 22.3-6.8 MG/ML ophthalmic solution Place 1 drop into both eyes 2 (two) times daily.   Yes [provider]  ferrous sulfate 325 (65 FE) MG tablet Take 325 mg by mouth daily after supper.   Yes [provider]  furosemide (LASIX) 80 MG tablet Take 1 tablet (80 mg total) by mouth daily. 01/12/19  Yes Shelly Coss, MD  glimepiride (AMARYL) 4 MG tablet Take 0.5 tablets (2 mg total) by mouth daily with breakfast. 06/14/17  Yes Regalado, Belkys A, MD  Ipratropium-Albuterol (COMBIVENT RESPIMAT) 20-100 MCG/ACT AERS respimat Inhale 1-2 puffs into the lungs every 4 (four) hours as needed for wheezing.    Yes [provider]  lamoTRIgine (LAMICTAL) 100 MG tablet Take 1 tablet (100 mg total) by mouth every evening. Patient taking differently: Take 100 mg by mouth daily after supper.  08/27/14  Yes Kerrie Buffalo, NP  latanoprost  (XALATAN) 0.005 % ophthalmic solution Place 1 drop into both eyes at bedtime. 08/27/14  Yes Kerrie Buffalo, NP  Magnesium 250 MG TABS Take 250 mg by mouth See admin instructions. Take 1 tablet (250 mg) by mouth every other day after supper   Yes [provider]  Menthol, Topical Analgesic, (BIOFREEZE EX) Apply 1 application topically 2 (two) times daily as needed (back pain).   Yes [provider]  metFORMIN (GLUCOPHAGE) 1000 MG tablet Take 1 tablet (1,000 mg total) by mouth daily with breakfast. Patient taking differently: Take 1,000 mg by mouth daily with breakfast. If not eating well, give 500 mg twice daily instead of 1066m daily 07/13/17  Yes NMariel Aloe MD  OPSUMIT 10 MG tablet Take 10 mg by mouth daily.  10/17/18  Yes [provider]  OXYGEN Inhale 7 L into the lungs See admin instructions.    Yes [provider]  Phenylephrine-APAP-Guaifenesin (MUCINEX SINUS-MAX) 10-650-400 MG/20ML LIQD Take 20 mLs by mouth 2 (two) times daily.   Yes [provider]  polyethylene glycol (MIRALAX / GLYCOLAX) packet Take 17 g by mouth daily. Patient taking differently: Take 17 g by mouth daily as needed (for constipation). Mix in 4-8 oz liquid and drink 09/08/16  Yes Vann, Jessica U, DO  Selenium Sulf-Pyrithione-Urea 2.25 % SHAM Apply 1 application topically See admin instructions. Apply topically to scalp for dermatitis once every 14 days 01/25/15  Yes [provider]  senna (SENOKOT) 8.6 MG tablet Take 1 tablet by mouth daily as needed for constipation.    Yes [provider]  sildenafil (REVATIO) 20 MG tablet Take 1 tablet (20 mg total) by mouth 3 (three) times daily. 06/14/17  Yes Regalado, Belkys A, MD  feeding supplement (BOOST / RESOURCE BREEZE) LIQD Take 1 Container by mouth 3 (three) times daily between meals. Patient not taking: Reported on 02/24/2019 07/13/17   NMariel Aloe MD  glucose blood (ACCU-CHEK AVIVA) test strip 1 each by Other  route 2 (two) times daily. 08/04/16   BJearld Fenton NP  Respiratory Therapy Supplies (FLUTTER) DEVI Use as directed. 05/26/17   Parrett, TFonnie Mu NP    Current Facility-Administered Medications  Medication Dose Route Frequency Provider Last Rate Last Dose  . 0.9 %  sodium chloride infusion  250 mL Intravenous PRN SLady Deutscher MD      .  acetaminophen (TYLENOL) tablet 650 mg  650 mg Oral Q12H PRN Lady Deutscher, MD      . albuterol (PROVENTIL) (2.5 MG/3ML) 0.083% nebulizer solution 2.5 mg  2.5 mg Nebulization Q2H PRN Lady Deutscher, MD   2.5 mg at 02/25/19 0534  . amoxicillin-clavulanate (AUGMENTIN) 500-125 MG per tablet 500 mg  1 tablet Oral BID WC Guilford Shi, MD   500 mg at 02/26/19 0959  . aspirin EC tablet 81 mg  81 mg Oral Daily Lady Deutscher, MD   81 mg at 02/26/19 0959  . atorvastatin (LIPITOR) tablet 20 mg  20 mg Oral QPC supper Lady Deutscher, MD   20 mg at 02/25/19 1718  . bisacodyl (DULCOLAX) suppository 10 mg  10 mg Rectal Daily PRN Lady Deutscher, MD      . brimonidine (ALPHAGAN) 0.2 % ophthalmic solution 1 drop  1 drop Both Eyes BID Lady Deutscher, MD   1 drop at 02/26/19 1000  . Chlorhexidine Gluconate Cloth 2 % PADS 6 each  6 each Topical Q0600 Lady Deutscher, MD   6 each at 02/26/19 [phone]  . dorzolamide-timolol (COSOPT) 22.3-6.8 MG/ML ophthalmic solution 1 drop  1 drop Both Eyes BID Lady Deutscher, MD   1 drop at 02/26/19 1001  . enoxaparin (LOVENOX) injection 30 mg  30 mg Subcutaneous Q24H Lady Deutscher, MD   30 mg at 02/25/19 2052  . feeding supplement (ENSURE ENLIVE) (ENSURE ENLIVE) liquid 237 mL  237 mL Oral BID BM Kamineni, Neelima, MD      . ferrous sulfate tablet 325 mg  325 mg Oral QPC supper Lady Deutscher, MD   325 mg at 02/25/19 1718  . furosemide (LASIX) injection 80 mg  80 mg Intravenous Q8H Adrian Prows, MD   80 mg at 02/26/19 1157  . glimepiride (AMARYL) tablet 2 mg  2 mg Oral Q breakfast Lady Deutscher, MD    2 mg at 02/26/19 1014  . insulin aspart (novoLOG) injection 0-20 Units  0-20 Units Subcutaneous TID WC Lady Deutscher, MD   3 Units at 02/26/19 0840  . insulin aspart (novoLOG) injection 0-5 Units  0-5 Units Subcutaneous QHS Lady Deutscher, MD   3 Units at 02/24/19 2144  . insulin aspart (novoLOG) injection 4 Units  4 Units Subcutaneous TID WC Lady Deutscher, MD   4 Units at 02/26/19 0840  . ipratropium-albuterol (DUONEB) 0.5-2.5 (3) MG/3ML nebulizer solution 3 mL  3 mL Nebulization Q6H Lady Deutscher, MD   3 mL at 02/26/19 0735  . lamoTRIgine (LAMICTAL) tablet 100 mg  100 mg Oral QPM Lady Deutscher, MD   100 mg at 02/25/19 1721  . latanoprost (XALATAN) 0.005 % ophthalmic solution 1 drop  1 drop Both Eyes QHS Lady Deutscher, MD   1 drop at 02/26/19 0014  . lidocaine (LIDODERM) 5 % 1 patch  1 patch Transdermal BID PRN Lady Deutscher, MD      . macitentan (OPSUMIT) tablet 10 mg  10 mg Oral Daily Lady Deutscher, MD   10 mg at 02/26/19 1013  . magnesium oxide (MAG-OX) tablet 200 mg  200 mg Oral Cathren Laine, MD   200 mg at 02/25/19 1718  . MEDLINE mouth rinse  15 mL Mouth Rinse BID Lady Deutscher, MD   15 mL at 02/26/19 0959  . methylPREDNISolone sodium succinate (SOLU-MEDROL) 40 mg/mL injection 40 mg  40 mg Intravenous Q6H Guilford Shi, MD  40 mg at 02/26/19 0354  . mometasone-formoterol (DULERA) 200-5 MCG/ACT inhaler 2 puff  2 puff Inhalation BID Lady Deutscher, MD   2 puff at 02/26/19 0736  . mupirocin ointment (BACTROBAN) 2 % 1 application  1 application Nasal BID Lady Deutscher, MD   1 application at 65/68/12 1003  . Muscle Rub CREA   Topical BID PRN Skeet Simmer, RPH      . polyethylene glycol (MIRALAX / GLYCOLAX) packet 17 g  17 g Oral Daily Lady Deutscher, MD      . senna [facility]) tablet 8.6 mg  1 tablet Oral Daily PRN Lady Deutscher, MD      . sildenafil (REVATIO) tablet 20 mg  20 mg Oral TID Lady Deutscher, MD   20  mg at 02/26/19 1013  . sodium chloride (OCEAN) 0.65 % nasal spray 1 spray  1 spray Each Nare PRN Guilford Shi, MD   1 spray at 02/25/19 1732  . sodium chloride flush (NS) 0.9 % injection 3 mL  3 mL Intravenous Q12H Lady Deutscher, MD   3 mL at 02/26/19 1004  . sodium chloride flush (NS) 0.9 % injection 3 mL  3 mL Intravenous PRN Lady Deutscher, MD        Allergies as of 02/24/2019 - Review Complete 02/24/2019  Allergen Reaction Noted  . Codeine Nausea And Vomiting 08/14/2014  . Nitrostat [nitroglycerin] Other (See Comments) 07/02/2017  . Prednisone Other (See Comments) 08/14/2014    Family History  Problem Relation Age of Onset  . Dementia Mother   . Alzheimer's disease Mother   . Heart disease Mother   . Hypertension Mother   . Diabetes Mother   . Diabetes Father   . Alzheimer's disease Sister   . Heart disease Brother   . Heart attack Brother   . Bladder Cancer Neg Hx   . Kidney cancer Neg Hx   . Prostate cancer Neg Hx     Social History   Socioeconomic History  . Marital status: Divorced    Spouse name: Not on file  . Number of children: 1  . Years of education: college 4  . Highest education level: Not on file  Occupational History  . Occupation: disabled  Social Needs  . Financial resource strain: Not on file  . Food insecurity:    Worry: Not on file    Inability: Not on file  . Transportation needs:    Medical: Not on file    Non-medical: Not on file  Tobacco Use  . Smoking status: Former Smoker    Packs/day: 1.00    Years: 28.00    Pack years: 28.00    Types: Cigarettes    Last attempt to quit: 07/15/2015    Years since quitting: 3.6  . Smokeless tobacco: Never Used  Substance and Sexual Activity  . Alcohol use: No    Alcohol/week: 0.0 standard drinks  . Drug use: No  . Sexual activity: Not Currently    Birth control/protection: None  Lifestyle  . Physical activity:    Days per week: Not on file    Minutes per session: Not on file  .  Stress: Not on file  Relationships  . Social connections:    Talks on phone: Not on file    Gets together: Not on file    Attends religious service: Not on file    Active member of club or organization: Not on file    Attends meetings  of clubs or organizations: Not on file    Relationship status: Not on file  . Intimate partner violence:    Fear of current or ex partner: Not on file    Emotionally abused: Not on file    Physically abused: Not on file    Forced sexual activity: Not on file  Other Topics Concern  . Not on file  Social History Narrative   Patient is right handed.   Patient drinks 2 cups caffeine daily.    Review of Systems: Gen: Denies any fever, chills, sweats, positive for anorexia weakness malaise HEENT: No visual complaints, No history of Retinopathy. Normal external appearance No Epistaxis or Sore throat. No sinusitis.   CV: Denies chest pain, angina, palpitations, syncope, orthopnea, PND, peripheral edema, and claudication. Resp: Admits to cough with green sputum production and history of COPD history of obstructive sleep apnea GI: Denies vomiting blood, jaundice, and fecal incontinence.   Denies dysphagia or odynophagia. GU : Denies urinary burning, blood in urine, urinary frequency, urinary hesitancy, nocturnal urination, and urinary incontinence.  No renal calculi. MS: Denies joint pain, limitation of movement, and swelling, stiffness, low back pain, extremity pain.  Weak debilitated Derm: Denies rash, itching, dry skin, hives, moles, warts, or unhealing ulcers.  Psych: History of psychosis using Abilify Heme: Denies bruising, bleeding, and enlarged lymph nodes. Neuro: No headache.  No diplopia. No dysarthria.  No dysphasia.  No history of CVA.  No Seizures. No paresthesias.  No weakness. Endocrine No DM.  No Thyroid disease.  Recent course of prednisone taper  Physical Exam: Vital signs in last 24 hours: Temp:  [97.4 F (36.3 C)-98.4 F (36.9 C)] 98 F  (36.7 C) (03/15 0449) Pulse Rate:  [75-88] 75 (03/15 0449) Resp:  [11-18] 11 (03/15 0449) BP: (84-123)/(58-75) 123/75 (03/15 0449) SpO2:  [91 %-100 %] 100 % (03/15 0736) Last BM Date: 02/25/19 General:   Alert chronically ill-appearing female Head:  Normocephalic and atraumatic. Eyes:  Sclera clear, no icterus.   Conjunctiva pink. Ears:  Normal auditory acuity. Nose:  No deformity, discharge,  or lesions. Mouth:  No deformity or lesions, dentition normal. Neck:  Supple; no masses or thyromegaly. JVP not elevated Lungs: Rhonchorous breath sounds heard throughout lung fields. Heart: regular rate and rhythm Abdomen:  Soft, nontender and nondistended. No masses, hepatosplenomegaly or hernias noted. Normal bowel sounds, without guarding, and without rebound.   Msk:  Symmetrical without gross deformities. Normal posture. Pulses:  No carotid, renal, femoral bruits. DP and PT symmetrical and equal Extremities:  Without clubbing or edema. Skin:  Intact without significant lesions or rashes. Psych:  Alert and cooperative. Normal mood and affect.  Intake/Output from previous day: 03/14 0701 - 03/15 0700 In: 621.6 [P.O.:200; I.V.:119.6; Blood:302] Out: 550 [Urine:550] Intake/Output this shift: Total I/O In: 3 [I.V.:3] Out: -   Lab Results: Recent Labs    02/24/19 1150  02/24/19 1450 02/25/19 0630 02/25/19 1550  WBC 9.6  --  10.7* 5.6  --   HGB 7.5*   < > 7.6* 6.9* 7.7*  HCT 25.4*   < > 24.5* 22.8* 25.1*  PLT 556*  --  543* 548*  --    < > = values in this interval not displayed.   BMET Recent Labs    02/25/19 0630 02/25/19 2052 02/26/19 0813  NA 119* 118* 117*  K 4.7 4.8 5.5*  CL 86* 87* 85*  CO2 19* 19* 19*  GLUCOSE 209* 84 153*  BUN 28* 34* 40*  CREATININE 2.22* 2.42* 2.45*  CALCIUM 9.0 8.8* 8.6*   LFT Recent Labs    02/24/19 1150  PROT 7.1  ALBUMIN 3.9  AST 40  ALT 19  ALKPHOS 53  BILITOT 0.6   PT/INR No results for input(s): LABPROT, INR in the last 72  hours. Hepatitis Panel No results for input(s): HEPBSAG, HCVAB, HEPAIGM, HEPBIGM in the last 72 hours.  Studies/Results: Dg Chest 2 View  Result Date: 02/25/2019 CLINICAL DATA:  Chronic bronchitis and COPD. EXAM: CHEST - 2 VIEW COMPARISON:  02/24/2019 FINDINGS: Cardiomegaly as seen previously. Slight improvement in interstitial lung density. Findings could represent a combination of bronchitis and interstitial edema. No consolidation, collapse or effusion. IMPRESSION: Radiographic improvement consistent with improving bronchitis and or interstitial edema. Electronically Signed   By: Nelson Chimes M.D.   On: 02/25/2019 10:43   Dg Chest Port 1 View  Result Date: 02/24/2019 CLINICAL DATA:  Shortness of breath for the past 4 days. EXAM: PORTABLE CHEST 1 VIEW COMPARISON:  01/24/2019. FINDINGS: Poor inspiration. No gross change in borderline enlargement of the cardiac silhouette. No Grace change in diffuse prominence of the interstitial markings with normal vasculature. No pleural fluid seen. Thoracic spine degenerative changes. IMPRESSION: No acute abnormality. Stable borderline cardiomegaly and moderate chronic interstitial lung disease when a decreased depth of inspiration is taken into account. Electronically Signed   By: Claudie Revering M.D.   On: 02/24/2019 12:18    Assessment/Plan:  Hyponatremia.  Sodium less than 10.  I do not see any results from her urine osmolality will order this.  This would be inconsistent with SIADH.  It does appear that she has some significant volume overload on x-ray and interstitial edema.  She also has diastolic heart failure with the last 2D echo performed 01/07/2019.  It is possible that she is developed some fluid overload in the setting of prednisone that she was discharged with whilst admitted for COPD exacerbation in January 2020.  Her dyspnea is difficult to assess because she does have a history of pulmonary artery hypertension.  She does have chronic kidney disease  in the serum creatinine runs between 1.7 and 2.1.  Is a little higher at this time.  This would impair her ability to excrete salt and water.  The intake of dilute liquid would certainly lead to hyponatremia in this situation.  I will therefore institute fluid restriction.  I agree with diuresis.  She is now been started on 80 mg IV of Lasix.  We also check a thyroid function study.  It does not appear that her cortisol will be accurate in the setting of Solu-Medrol administration.  Even though this was measured low.  Chronic kidney disease stage III renal function relatively stable slight increase in serum creatinine.  Will check renal ultrasound.  Urine microscopy avoid ACE inhibitors ARB use nonsteroidal inflammatories Cox 2 inhibitors IV contrast.  Will check serum protein pheresis and urine immunofixation  Anemia appears to have some iron deficiency anemia would replete with IV iron.  Mild hyperkalemia will treat with Lokelma 10 g 3 times daily  Pulmonary artery hypertension continue sildenafil 3 times a day and Opsumit  Psychosis.  Patient has been discontinued on her Abilify.  It looks like she has been taking this since 2015.  This may be amplifying her hyponatremia but certainly not causing it  Metabolic acidosis we will start sodium bicarbonate 650 mg 2 tablets with meals  COPD chronic oxygen therapy titrate to maintain O2 sats above 92%  Diastolic heart failure agree with diuresis.  80 mg IV every 8 hours.  I did void thiazide type diuretics as these will worsen her hyponatremia  Diabetes mellitus avoid metformin.    LOS: [place] _0 _1 :01 PM

## 2019-02-27 ENCOUNTER — Encounter (HOSPITAL_COMMUNITY): Payer: Self-pay | Admitting: *Deleted

## 2019-02-27 DIAGNOSIS — E119 Type 2 diabetes mellitus without complications: Secondary | ICD-10-CM

## 2019-02-27 DIAGNOSIS — I2781 Cor pulmonale (chronic): Secondary | ICD-10-CM

## 2019-02-27 LAB — BASIC METABOLIC PANEL
Anion gap: 12 (ref 5–15)
BUN: 49 mg/dL — ABNORMAL HIGH (ref 8–23)
CO2: 24 mmol/L (ref 22–32)
CREATININE: 2.57 mg/dL — AB (ref 0.44–1.00)
Calcium: 9.3 mg/dL (ref 8.9–10.3)
Chloride: 87 mmol/L — ABNORMAL LOW (ref 98–111)
GFR calc Af Amer: 21 mL/min — ABNORMAL LOW (ref >60)
GFR calc non Af Amer: 18 mL/min — ABNORMAL LOW (ref >60)
Glucose, Bld: 91 mg/dL (ref 70–99)
Potassium: 4.7 mmol/L (ref 3.5–5.1)
Sodium: 123 mmol/L — ABNORMAL LOW (ref 135–145)

## 2019-02-27 LAB — CBC
HCT: 24.8 % — ABNORMAL LOW (ref 36.0–46.0)
HEMOGLOBIN: 7.7 g/dL — AB (ref 12.0–15.0)
MCH: 24.4 pg — ABNORMAL LOW (ref 26.0–34.0)
MCHC: 31 g/dL (ref 30.0–36.0)
MCV: 78.7 fL — ABNORMAL LOW (ref 80.0–100.0)
Platelets: 550 10*3/uL — ABNORMAL HIGH (ref 150–400)
RBC: 3.15 MIL/uL — ABNORMAL LOW (ref 3.87–5.11)
RDW: 17.6 % — ABNORMAL HIGH (ref 11.5–15.5)
WBC: 5.5 10*3/uL (ref 4.0–10.5)
nRBC: 0.7 % — ABNORMAL HIGH (ref 0.0–0.2)

## 2019-02-27 LAB — PROTEIN ELECTROPHORESIS, SERUM
A/G Ratio: 1.4 (ref 0.7–1.7)
Albumin ELP: 4 g/dL (ref 2.9–4.4)
Alpha-1-Globulin: 0.3 g/dL (ref 0.0–0.4)
Alpha-2-Globulin: 0.9 g/dL (ref 0.4–1.0)
Beta Globulin: 1.1 g/dL (ref 0.7–1.3)
Gamma Globulin: 0.6 g/dL (ref 0.4–1.8)
Globulin, Total: 2.8 g/dL (ref 2.2–3.9)
Total Protein ELP: 6.8 g/dL (ref 6.0–8.5)

## 2019-02-27 LAB — IMMUNOFIXATION ELECTROPHORESIS
IGA: 94 mg/dL (ref 87–352)
IgG (Immunoglobin G), Serum: 610 mg/dL — ABNORMAL LOW (ref 700–1600)
IgM (Immunoglobulin M), Srm: 215 mg/dL (ref 26–217)
Total Protein ELP: 6.8 g/dL (ref 6.0–8.5)

## 2019-02-27 LAB — ACTH STIMULATION, 3 TIME POINTS
CORTISOL 60 MIN: 24.6 ug/dL
Cortisol, 30 Min: 20.3 ug/dL
Cortisol, Base: 5.4 ug/dL

## 2019-02-27 LAB — GLUCOSE, CAPILLARY
Glucose-Capillary: 100 mg/dL — ABNORMAL HIGH (ref 70–99)
Glucose-Capillary: 88 mg/dL (ref 70–99)
Glucose-Capillary: 223 mg/dL — ABNORMAL HIGH (ref 70–99)
Glucose-Capillary: 89 mg/dL (ref 70–99)
Glucose-Capillary: 194 mg/dL — ABNORMAL HIGH (ref 70–99)

## 2019-02-27 LAB — CORTISOL: CORTISOL PLASMA: 5.8 ug/dL

## 2019-02-27 MED ORDER — OXYCODONE HCL 5 MG PO TABS
5.0000 mg | ORAL_TABLET | Freq: Two times a day (BID) | ORAL | Status: DC | PRN
  Administered 2019-02-27: 5 mg via ORAL
  Filled 2019-02-27: qty 1

## 2019-02-27 MED ORDER — DARBEPOETIN ALFA 150 MCG/0.3ML IJ SOSY
150.0000 ug | PREFILLED_SYRINGE | INTRAMUSCULAR | Status: DC
  Administered 2019-02-27 – 2019-03-06 (×2): 150 ug via SUBCUTANEOUS
  Filled 2019-02-27 (×2): qty 0.3

## 2019-02-27 MED ORDER — COSYNTROPIN 0.25 MG IJ SOLR
0.2500 mg | INTRAMUSCULAR | Status: AC
  Administered 2019-02-27: 0.25 mg via INTRAVENOUS
  Filled 2019-02-27 (×2): qty 0.25

## 2019-02-27 NOTE — Progress Notes (Addendum)
Occupational Therapy Treatment Patient Details Name: Elizabeth Thompson MRN: [id number] DOB: 1951-01-30 Today's Date: 02/27/2019    History of present illness  Elizabeth Thompson is a 68 y.o. female with medical history significant of bipolar disorder, pulmonary hypertension with COPD on 6 L nasal cannula at baseline, diabetes type 2 who presented with worsening shortness of breath and a nonproductive cough.  Work up includes acute on chronic hypoxic respiratory failure in the setting of decompensated pulmonary HTN, cor pulmonale and diastolic dysfunction with pulmonary edema.   OT comments  Pt progressing toward established goals. Pt tolerated session, but limited due to fatigue requesting to return to bed to take a nap. Pt currently requires minguard for toilet transfer, toilet hygiene and mingaurd for functional mobility. Pt educated on energy conservation strategies with provided handout. Pt on 15lnc, VSS throughout session. Pt will continue to benefit from skilled OT services to maximize safety and independence with ADL and functional mobility to allow safe d/c to venue listed below. Will continue to follow acutely.     Follow Up Recommendations  Home health OT;Supervision/Assistance - 24 hour    Equipment Recommendations       Recommendations for Other Services      Precautions / Restrictions Precautions Precautions: Fall Restrictions Weight Bearing Restrictions: No       Mobility Bed Mobility Overal bed mobility: Modified Independent             General bed mobility comments: pt able to adjust self in bed;increased time and effort required to return sit>supine  Transfers Overall transfer level: Needs assistance   Transfers: Sit to/from Stand Sit to Stand: Min guard Stand pivot transfers: Min guard            Balance Overall balance assessment: Needs assistance Sitting-balance support: No upper extremity supported;Feet supported Sitting balance-Leahy Scale:  Fair     Standing balance support: During functional activity;Single extremity supported Standing balance-Leahy Scale: Fair Standing balance comment: pt able to complete pericare with single UE support                           ADL either performed or assessed with clinical judgement   ADL Overall ADL's : Needs assistance/impaired                         Toilet Transfer: Min guard;Comfort height toilet;Cueing for Office manager Details (indicate cue type and reason): pt transferred from [facility] to [place] and Hygiene: Min guard;Cueing for safety;Sit to/from stand Toileting - Clothing Manipulation Details (indicate cue type and reason): pt required mingaurd while standing for ADL completion     Functional mobility during ADLs: Min guard;Cueing for safety;Cueing for sequencing General ADL Comments: educated pt on energy conservation strategies during ADL with provided handout     Vision       Perception     Praxis      Cognition Arousal/Alertness: Awake/alert Behavior During Therapy: Flat affect Overall Cognitive Status: Within Functional Limits for tasks assessed                                          Exercises     Shoulder Instructions       General Comments VSS throughout session    Pertinent Vitals/ Pain  Pain Assessment: No/denies pain  Home Living                                          Prior Functioning/Environment              Frequency  Min 2X/week        Progress Toward Goals  OT Goals(current goals can now be found in the care plan section)  Progress towards OT goals: Progressing toward goals  Acute Rehab OT Goals Patient Stated Goal: to get stronger OT Goal Formulation: With patient Time For Goal Achievement: 03/04/19 Potential to Achieve Goals: Good ADL Goals Pt Will Perform Lower Body Bathing: with modified independence;sit to/from  stand Pt Will Perform Lower Body Dressing: with modified independence;sit to/from stand Pt Will Transfer to Toilet: with modified independence;grab bars Additional ADL Goal #1: Pt will complete a standing ADL for 5 min interval with stable o2 stats  Plan Discharge plan remains appropriate    Co-evaluation                 AM-PAC OT "6 Clicks" Daily Activity     Outcome Measure   Help from another person eating meals?: None Help from another person taking care of personal grooming?: A Little Help from another person toileting, which includes using toliet, bedpan, or urinal?: A Little Help from another person bathing (including washing, rinsing, drying)?: A Lot Help from another person to put on and taking off regular upper body clothing?: A Little Help from another person to put on and taking off regular lower body clothing?: A Lot 6 Click Score: 17    End of Session Equipment Utilized During Treatment: Oxygen  OT Visit Diagnosis: Unsteadiness on feet (R26.81);Muscle weakness (generalized) (M62.81)   Activity Tolerance Patient tolerated treatment well;Patient limited by fatigue   Patient Left in bed;with call bell/phone within reach;with family/visitor present   Nurse Communication Mobility status        Time: 1119-1140 OT Time Calculation (min): 21 min  Charges: OT General Charges $OT Visit: 1 Visit OT Treatments $Self Care/Home Management : 8-22 mins  [place] Office: [place] 02/27/2019, 1:20 PM

## 2019-02-27 NOTE — Progress Notes (Signed)
PROGRESS NOTE    Elizabeth Thompson  XFG:[id number] DOB: March 11, 1951 DOA: 02/24/2019 PCP: Everardo Beals, NP   Brief Narrative:  68 year old with history of bipolar disorder, pulmonary hypertension, COPD on 6 L nasal cannula, diabetes mellitus type 2 came to the hospital with worsening of shortness of breath and nonproductive cough with hypoxia.  She started receiving IV Solu-Medrol and bronchodilators.  She was initially on BiPAP also subsequently thought there was concerns of fluid overload therefore started on IV diuretics.  Pulmonary, nephrology, cardiology and palliative care teams were consulted.   Assessment & Plan:   Principal Problem:   Pulmonary hypertension (HCC) Active Problems:   Diabetes mellitus type 2, controlled, without complications (HCC)   COPD exacerbation (HCC)   COPD GOLD 0    Acute on chronic respiratory failure (HCC)   Goals of care, counseling/discussion   Cor pulmonale, chronic (HCC)   Acute on chronic right-sided heart failure (HCC)   Hyponatremia with excess extracellular fluid volume   Acute hypoxic respiratory failure requiring 15 L nasal cannula/nonrebreather Multifactorial- acute COPD exacerbation Group 3 pulmonary arterial hypertension - Continue IV steroids Solu-Medrol 40 mg IV every 12 hours, aggressive bronchodilator treatments.  Dulera.  Incentive spirometry and flutter valve - Supplemental oxygen as needed.  Continue sildenafil and OPSUMIT. -On Augmentin -Lasix 80 IV every 8 hours.  Closely monitor fluid status. - Could benefit from trilogy at home?  Hyponatremia -Cortisol stim test-normal.  At this time improving with diuresis and fluid restriction.  Closely monitor sodium levels.  Acute on chronic renal failure, stage III - Repeat ultrasound appears to be negative.  Nephrology team is following.  Closely monitor electrolytes.  On Lokelma.  Acute on chronic diastolic congestive heart failure, class IV - Echocardiogram January  2020-normal ejection fraction grade 1 diastolic dysfunction.  Cardiology following.  Getting aggressive diuresis.  Anemia of chronic disease, stage IV.  Baseline creatinine 1.8 - Iron deficiency noted.  Iron supplements  And Epo  Essential hypertension -In acceptable range.  Currently getting aggressive diuresis.  Hyperlipidemia -Continue Lipitor 20 mg daily  Diabetes mellitus type 2 -Accu-Cheks and sliding scale.  Continue glipizide.  Metformin is on hold.  Bipolar disorder/dementia -Abilify and Aricept on hold due to hyponatremia  DVT prophylaxis: SCDs Code Status: DNR Family Communication: None at bedside Disposition Plan: To be determined  Consultants:   Pulmonary  Nephrology  Palliative care   Subjective: Gets quite short of breath with movement.  Overnight she became acutely hypoxic requiring 15 L of nonrebreather. Had an extensive discussion with the patient's niece and the patient this morning regarding goals of care.  At this time they would like as much treatment as possible but she still remains DNR/DNI.  Review of Systems Otherwise negative except as per HPI, including: General: Denies fever, chills, night sweats or unintended weight loss. Resp: Denies hemoptysis Cardiac: Denies chest pain, palpitations, orthopnea, paroxysmal nocturnal dyspnea. GI: Denies abdominal pain, nausea, vomiting, diarrhea or constipation GU: Denies dysuria, frequency, hesitancy or incontinence MS: Denies muscle aches, joint pain or swelling Neuro: Denies headache, neurologic deficits (focal weakness, numbness, tingling), abnormal gait Psych: Denies anxiety, depression, SI/HI/AVH Skin: Denies new rashes or lesions ID: Denies sick contacts, exotic exposures, travel  Objective: Vitals:   02/27/19 0214 02/27/19 0500 02/27/19 0608 02/27/19 0900  BP:      Pulse:      Resp:      Temp:   97.6 F (36.4 C)   TempSrc:      SpO2: 98%  95%  Weight:  91 kg    Height:         Intake/Output Summary (Last 24 hours) at 02/27/2019 1323 Last data filed at 02/27/2019 1230 Gross per 24 hour  Intake 600 ml  Output 1525 ml  Net -925 ml   Filed Weights   02/24/19 1600 02/25/19 0431 02/27/19 0500  Weight: 93.4 kg 91.2 kg 91 kg    Examination:  General exam: Slight short of breath.  On nonrebreather. Respiratory system: Diffuse diminished breath sounds Cardiovascular system: S1 & S2 heard, RRR. No JVD, murmurs, rubs, gallops or clicks. No pedal edema. Gastrointestinal system: Abdomen is nondistended, soft and nontender. No organomegaly or masses felt. Normal bowel sounds heard. Central nervous system: Alert and oriented. No focal neurological deficits. Extremities: Symmetric 4 x 5 power. Skin: No rashes, lesions or ulcers Psychiatry: Judgement and insight appear normal. Mood & affect appropriate.     Data Reviewed:   CBC: Recent Labs  Lab 02/24/19 1150 02/24/19 1201 02/24/19 1450 02/25/19 0630 02/25/19 1550 02/27/19 0932  WBC 9.6  --  10.7* 5.6  --  5.5  NEUTROABS 7.5  --   --   --   --   --   HGB 7.5* 8.8* 7.6* 6.9* 7.7* 7.7*  HCT 25.4* 26.0* 24.5* 22.8* 25.1* 24.8*  MCV 78.4*  --  78.0* 78.1*  --  78.7*  PLT 556*  --  543* 548*  --  902*   Basic Metabolic Panel: Recent Labs  Lab 02/24/19 1150 02/24/19 1201 02/24/19 1450 02/25/19 0630 02/25/19 2052 02/26/19 0813 02/27/19 0932  NA 122* 120*  --  119* 118* 117* 123*  K 4.6 4.7  --  4.7 4.8 5.5* 4.7  CL 89*  --   --  86* 87* 85* 87*  CO2 21*  --   --  19* 19* 19* 24  GLUCOSE 86  --   --  209* 84 153* 91  BUN 20  --   --  28* 34* 40* 49*  CREATININE 1.96*  --  2.07* 2.22* 2.42* 2.45* 2.57*  CALCIUM 9.1  --   --  9.0 8.8* 8.6* 9.3   GFR: Estimated Creatinine Clearance: 22.4 mL/min (A) (by C-G formula based on SCr of 2.57 mg/dL (H)). Liver Function Tests: Recent Labs  Lab 02/24/19 1150  AST 40  ALT 19  ALKPHOS 53  BILITOT 0.6  PROT 7.1  ALBUMIN 3.9   No results for input(s):  LIPASE, AMYLASE in the last 168 hours. No results for input(s): AMMONIA in the last 168 hours. Coagulation Profile: No results for input(s): INR, PROTIME in the last 168 hours. Cardiac Enzymes: No results for input(s): CKTOTAL, CKMB, CKMBINDEX, TROPONINI in the last 168 hours. BNP (last 3 results) No results for input(s): PROBNP in the last 8760 hours. HbA1C: Recent Labs    02/24/19 1500  HGBA1C 6.5*   CBG: Recent Labs  Lab 02/26/19 1714 02/26/19 2119 02/27/19 0606 02/27/19 0830 02/27/19 1217  GLUCAP 170* 103* 100* 88 223*   Lipid Profile: No results for input(s): CHOL, HDL, LDLCALC, TRIG, CHOLHDL, LDLDIRECT in the last 72 hours. Thyroid Function Tests: Recent Labs    02/26/19 1453  TSH 0.308*   Anemia Panel: Recent Labs    02/25/19 1758  VITAMINB12 1,459*  FOLATE 36.1  FERRITIN 26  TIBC 490*  IRON 31  RETICCTPCT 3.8*   Sepsis Labs: No results for input(s): PROCALCITON, LATICACIDVEN in the last 168 hours.  Recent Results (from the past 240  hour(s))  Respiratory Panel by PCR     Status: None   Collection Time: 02/24/19 11:50 AM  Result Value Ref Range Status   Adenovirus NOT DETECTED NOT DETECTED Final   Coronavirus 229E NOT DETECTED NOT DETECTED Final    Comment: (NOTE) The Coronavirus on the Respiratory Panel, DOES NOT test for the novel  Coronavirus (2019 nCoV)    Coronavirus HKU1 NOT DETECTED NOT DETECTED Final   Coronavirus NL63 NOT DETECTED NOT DETECTED Final   Coronavirus OC43 NOT DETECTED NOT DETECTED Final   Metapneumovirus NOT DETECTED NOT DETECTED Final   Rhinovirus / Enterovirus NOT DETECTED NOT DETECTED Final   Influenza A NOT DETECTED NOT DETECTED Final   Influenza B NOT DETECTED NOT DETECTED Final   Parainfluenza Virus 1 NOT DETECTED NOT DETECTED Final   Parainfluenza Virus 2 NOT DETECTED NOT DETECTED Final   Parainfluenza Virus 3 NOT DETECTED NOT DETECTED Final   Parainfluenza Virus 4 NOT DETECTED NOT DETECTED Final   Respiratory  Syncytial Virus NOT DETECTED NOT DETECTED Final   Bordetella pertussis NOT DETECTED NOT DETECTED Final   Chlamydophila pneumoniae NOT DETECTED NOT DETECTED Final   Mycoplasma pneumoniae NOT DETECTED NOT DETECTED Final    Comment: Performed at [facility], [address], [place], [place] [address]  MRSA PCR Screening     Status: Abnormal   Collection Time: 02/24/19  6:35 PM  Result Value Ref Range Status   MRSA by PCR POSITIVE (A) NEGATIVE Final    Comment:        The GeneXpert MRSA Assay (FDA approved for NASAL specimens only), is one component of a comprehensive MRSA colonization surveillance program. It is not intended to diagnose MRSA infection nor to guide or monitor treatment for MRSA infections. RESULT CALLED TO, READ BACK BY AND VERIFIED WITH: Marguerita Beards RN 2205 02/24/19 A BROWNING Performed at [facility], [place] [address], [place], [place] [address]          Radiology Studies: US Renal  Result Date: 02/26/2019 CLINICAL DATA:  Acute renal failure. EXAM: RENAL / URINARY TRACT ULTRASOUND COMPLETE COMPARISON:  CT abdomen pelvis 04/27/2018 FINDINGS: Right Kidney: Renal measurements: 11.4 x 5.0 x 4.9 cm = volume: 146.6 mL. Normal renal cortical thickness. No hydronephrosis. Multiple hypoechoic lesions within the right kidney of various sizes measuring up to 2.8 cm, favored to represent cysts although not all are completely characterized. Left Kidney: Renal measurements: 9.9 x 4.6 x 4.5 cm = volume: 107.4 mL. Normal renal cortical thickness and echogenicity. No hydronephrosis. Multiple hypoechoic lesions within the left kidney, some of which represent cysts. Many are incompletely characterized. Bladder: Appears normal for degree of bladder distention. IMPRESSION: No hydronephrosis. Electronically Signed   By: Lovey Newcomer M.D.   On: 02/26/2019 14:56   Ct Chest High Resolution  Result Date: 02/27/2019 CLINICAL DATA:  68 year old female with history of acute on chronic  hypoxic respiratory failure. COPD exacerbation. Evaluate for potential interstitial lung disease. EXAM: CT CHEST WITHOUT CONTRAST TECHNIQUE: Multidetector CT imaging of the chest was performed following the standard protocol without intravenous contrast. High resolution imaging of the lungs, as well as inspiratory and expiratory imaging, was performed. COMPARISON:  Chest CT 06/09/2017. FINDINGS: Cardiovascular: Heart size is mildly enlarged. There is no significant pericardial fluid, thickening or pericardial calcification. There is aortic atherosclerosis, as well as atherosclerosis of the great vessels of the mediastinum and the coronary arteries, including calcified atherosclerotic plaque in the left main, left anterior descending, left circumflex  and right coronary arteries. Calcifications of the aortic valve. Dilatation of the pulmonic trunk (3.7 cm in diameter). Mediastinum/Nodes: No pathologically enlarged mediastinal or hilar lymph nodes. Please note that accurate exclusion of hilar adenopathy is limited on noncontrast CT scans. Esophagus is unremarkable in appearance. No axillary lymphadenopathy. Lungs/Pleura: Diffuse bronchial wall thickening with moderate centrilobular and paraseptal emphysema. High-resolution images also demonstrate some ground-glass attenuation and mild interlobular septal thickening most evident throughout the mid to lower lungs bilaterally. No associated significant regions of traction bronchiectasis or honeycombing. Inspiratory and expiratory imaging demonstrates some mild air trapping, indicative of mild small airways disease. Upper Abdomen: Aortic atherosclerosis. Exophytic lesion in the upper pole of the left kidney measuring 2.1 cm in diameter, incompletely characterized on today's noncontrast CT examination, but stable in size and demonstrating new calcifications compared to prior study from 2018, potentially related to prior ablation. Musculoskeletal: There are no aggressive  appearing lytic or blastic lesions noted in the visualized portions of the skeleton. IMPRESSION: 1. No definite findings to suggest interstitial lung disease. The ground-glass attenuation and interlobular septal thickening is favored to reflect a background of mild interstitial pulmonary edema in light of the patient's cardiomegaly, which could suggest mild congestive heart failure. If there is persistent clinical concern for interstitial lung disease, repeat high-resolution chest CT should be obtained in 12 months to assess for temporal changes in the appearance of the lung parenchyma. 2. Diffuse bronchial wall thickening with moderate centrilobular and paraseptal emphysema; imaging findings compatible with the reported clinical history of underlying COPD. 3. Dilatation of the pulmonic trunk (3.7 cm in diameter), concerning for associated pulmonary arterial hypertension. 4. Aortic atherosclerosis, in addition to left main and 3 vessel coronary artery disease. Please note that although the presence of coronary artery calcium documents the presence of coronary artery disease, the severity of this disease and any potential stenosis cannot be assessed on this non-gated CT examination. Assessment for potential risk factor modification, dietary therapy or pharmacologic therapy may be warranted, if clinically indicated. 5. There are calcifications of the aortic valve. Echocardiographic correlation for evaluation of potential valvular dysfunction may be warranted if clinically indicated. Aortic Atherosclerosis (ICD10-I70.0) and Emphysema (ICD10-J43.9). Electronically Signed   By: Vinnie Langton M.D.   On: 02/27/2019 08:16        Scheduled Meds: . amoxicillin-clavulanate  1 tablet Oral BID WC  . aspirin EC  81 mg Oral Daily  . atorvastatin  20 mg Oral QPC supper  . brimonidine  1 drop Both Eyes BID  . Chlorhexidine Gluconate Cloth  6 each Topical Q0600  . darbepoetin (ARANESP) injection - NON-DIALYSIS  150  mcg Subcutaneous Q Mon-1800  . dorzolamide-timolol  1 drop Both Eyes BID  . enoxaparin (LOVENOX) injection  30 mg Subcutaneous Q24H  . feeding supplement (ENSURE ENLIVE)  237 mL Oral BID BM  . ferrous sulfate  325 mg Oral QPC supper  . furosemide  80 mg Intravenous Q8H  . glimepiride  2 mg Oral Q breakfast  . insulin aspart  0-20 Units Subcutaneous TID WC  . insulin aspart  0-5 Units Subcutaneous QHS  . insulin aspart  4 Units Subcutaneous TID WC  . ipratropium-albuterol  3 mL Nebulization Q6H  . lamoTRIgine  100 mg Oral QPM  . latanoprost  1 drop Both Eyes QHS  . macitentan  10 mg Oral Daily  . magnesium oxide  200 mg Oral QODAY  . mouth rinse  15 mL Mouth Rinse BID  . methylPREDNISolone (SOLU-MEDROL) injection  40 mg Intravenous Q12H  . mometasone-formoterol  2 puff Inhalation BID  . mupirocin ointment  1 application Nasal BID  . polyethylene glycol  17 g Oral Daily  . sildenafil  20 mg Oral TID  . sodium bicarbonate  1,300 mg Oral TID  . sodium chloride flush  3 mL Intravenous Q12H   Continuous Infusions: . sodium chloride    . ferric gluconate (FERRLECIT/NULECIT) IV 125 mg (02/27/19 1230)     LOS: 3 days   Time spent= 35 mins    Jassiah Viviano Arsenio Loader, MD [facility]  If 7PM-7AM, please contact night-coverage [email or web address] 02/27/2019, 1:23 PM

## 2019-02-27 NOTE — Progress Notes (Signed)
Cortisol level to be drawn at 0800. Patient and family member instructed not to eat morning tray as per instructed by phlebotomy.

## 2019-02-27 NOTE — Progress Notes (Signed)
Daily Progress Note   Patient Name: Elizabeth Thompson       Date: 02/27/2019 DOB: Dec 21, 1950  Age: 68 y.o. MRN#: [id number] Attending Physician: Damita Lack, MD Primary Care Physician: Everardo Beals, NP Admit Date: 02/24/2019  Reason for Consultation/Follow-up: Establishing goals of care  Subjective: Alert and oriented. Feeling "much better."   Length of Stay: 3  Current Medications: Scheduled Meds:   amoxicillin-clavulanate  1 tablet Oral BID WC   aspirin EC  81 mg Oral Daily   atorvastatin  20 mg Oral QPC supper   brimonidine  1 drop Both Eyes BID   Chlorhexidine Gluconate Cloth  6 each Topical Q0600   dorzolamide-timolol  1 drop Both Eyes BID   enoxaparin (LOVENOX) injection  30 mg Subcutaneous Q24H   feeding supplement (ENSURE ENLIVE)  237 mL Oral BID BM   ferrous sulfate  325 mg Oral QPC supper   furosemide  80 mg Intravenous Q8H   glimepiride  2 mg Oral Q breakfast   insulin aspart  0-20 Units Subcutaneous TID WC   insulin aspart  0-5 Units Subcutaneous QHS   insulin aspart  4 Units Subcutaneous TID WC   ipratropium-albuterol  3 mL Nebulization Q6H   lamoTRIgine  100 mg Oral QPM   latanoprost  1 drop Both Eyes QHS   macitentan  10 mg Oral Daily   magnesium oxide  200 mg Oral QODAY   mouth rinse  15 mL Mouth Rinse BID   methylPREDNISolone (SOLU-MEDROL) injection  40 mg Intravenous Q12H   mometasone-formoterol  2 puff Inhalation BID   mupirocin ointment  1 application Nasal BID   polyethylene glycol  17 g Oral Daily   sildenafil  20 mg Oral TID   sodium bicarbonate  1,300 mg Oral TID   sodium chloride flush  3 mL Intravenous Q12H   sodium zirconium cyclosilicate  10 g Oral TID    Continuous Infusions:  sodium chloride      ferric gluconate (FERRLECIT/NULECIT) IV 125 mg (02/26/19 1357)    PRN Meds: sodium chloride, acetaminophen, albuterol, bisacodyl, lidocaine, Muscle Rub, senna, sodium chloride, sodium chloride flush  Physical Exam Vitals signs and nursing note reviewed.  Constitutional:      Appearance: She is overweight. She is ill-appearing.  Cardiovascular:     Rate and Rhythm:  Normal rate and regular rhythm.     Comments: PVCs occasionally Pulmonary:     Effort: No tachypnea, accessory muscle usage or respiratory distress.     Breath sounds: Decreased breath sounds present.  Abdominal:     Palpations: Abdomen is soft.  Neurological:     Mental Status: She is oriented to person, place, and time. She is lethargic.             Vital Signs: BP 123/75 (BP Location: Right Arm)    Pulse 75    Temp 97.6 F (36.4 C)    Resp 11    Ht _0  (1.6 m)    Wt 91 kg    SpO2 95%    BMI 35.54 kg/m  SpO2: SpO2: 95 % O2 Device: O2 Device: NRB O2 Flow Rate: O2 Flow Rate (L/min): 15 L/min  Intake/output summary:   Intake/Output Summary (Last 24 hours) at 02/27/2019 1151 Last data filed at 02/27/2019 0630 Gross per 24 hour  Intake 600 ml  Output 575 ml  Net 25 ml   LBM: Last BM Date: 02/26/19 Baseline Weight: Weight: 87.1 kg Most recent weight: Weight: 91 kg       Palliative Assessment/Data:    Flowsheet Rows     Most Recent Value  Intake Tab  Referral Department  Hospitalist  Unit at Time of Referral  Med/Surg Unit  Palliative Care Primary Diagnosis  Pulmonary  Date Notified  02/24/19  Palliative Care Type  Return patient Palliative Care  Reason for referral  Non-pain Symptom  Date of Admission  02/24/19  Date first seen by Palliative Care  02/25/19  # of days Palliative referral response time  1 Day(s)  # of days IP prior to Palliative referral  0  Clinical Assessment  Palliative Performance Scale Score  40%  Pain Max last 24 hours  Not able to report  Pain Min Last 24 hours  Not able to  report  Dyspnea Max Last 24 Hours  Not able to report  Dyspnea Min Last 24 hours  Not able to report  Nausea Max Last 24 Hours  Not able to report  Nausea Min Last 24 Hours  Not able to report  Anxiety Max Last 24 Hours  Not able to report  Anxiety Min Last 24 Hours  Not able to report  Other Max Last 24 Hours  Not able to report  Psychosocial & Spiritual Assessment  Palliative Care Outcomes  Patient/Family meeting held?  Yes  Who was at the meeting?  pt, and HCPOA  Patient/Family wishes: Interventions discontinued/not started   Trach, Mechanical Ventilation  Palliative Care follow-up planned  Yes, Facility      Patient Active Problem List   Diagnosis Date Noted   Cor pulmonale, chronic (HCC)    Acute on chronic right-sided heart failure (HCC)    Hyponatremia with excess extracellular fluid volume    Goals of care, counseling/discussion    Palliative care by specialist    OSA (obstructive sleep apnea)    AKI (acute kidney injury) ([place]) 01/03/2019   Acute respiratory failure ([place]) 01/03/2019   Hypertension 07/02/2017   Chronic respiratory failure with hypoxia, on home O2 therapy ([place]) 07/02/2017   COPD (chronic obstructive pulmonary disease) () 07/02/2017   Diabetes mellitus without complication ([place]) 12/75/1700   Pulmonary hypertension ([place]) 07/02/2017   Bipolar disorder ([place]) 07/02/2017   Hyperlipidemia 07/02/2017   Acute hyponatremia 17/49/4496   Acute metabolic encephalopathy 75/91/6384   CKD (chronic kidney disease) stage  3, GFR 30-59 ml/min (HCC) 07/02/2017   Elevated troponin    Cor pulmonale, acute (HCC)    Acute on chronic respiratory failure ([place]) 06/09/2017   Chronic renal disease, stage III ([place]) 05/05/2017   Chronic respiratory failure with hypoxia ([place]) 05/05/2017   Renal cell carcinoma, left (HCC) 02/05/2017   Dyspnea 12/28/2016   Depression 09/04/2016   Renal malignant tumor ([place]) 06/26/2016   GERD (gastroesophageal reflux  disease) 03/22/2016   Insomnia 03/22/2016   COPD GOLD 0  12/06/2015   Essential hypertension 12/06/2015   COPD exacerbation ([place]) 11/20/2015   Multiple lung nodules 11/20/2015   Morbid obesity due to excess calories ([place]) 11/20/2015   Hypersomnia with sleep apnea 11/20/2015   Diabetes mellitus type 2, controlled, without complications ([place]) 61/44/3154   HLD (hyperlipidemia) 11/06/2015   Chronic low back pain 05/09/2015   Gait disorder 05/09/2015   Memory disorder 09/05/2014   Schizoaffective disorder ([place]) 08/18/2014    Palliative Care Assessment & Plan   HPI: 68 yo female with PMH of pulmonary hypertension, cor pulmonale, diastolic CHF, , HTN, CKD stage IV, diabetes, chronic back pain admitted 02/24/2019 with worsening SOB and cough r/t pulmonary hypertension, cor pulmonale, and diastolic CHF with pulmonary edema. She has continued to require NRB with rising creatinine, and hyponatremia.   Assessment: I met today at [facility] bedside with herself and her niece/HCPOA/[name]. [name] lives and cares for Jordanne and is a retired Therapist, sports. She is very knowledgeable of Fiorella's condition and they are very open that they are very aware of poor prognosis and that Aydin's condition will end her life. [name] is very clear that her goals are to ensure that everything is being done (within reason) to try and help Latania to improve. She feels that Shona is much improved today compared to yesterday in mentation and increase urine output. Michaelann is eating well and ambulates with minimal assist while oxygen levels and respirations remaining stable. She is very sleepy. [name] is hopeful that we can continue to try diuresis and titrate off NRB which will allow her to take her back home. [name] goes back and forth about considering a trial of dialysis although I explain that I feel this would be very difficult for Dontasia and I believe it could cause her more harm and discomfort that help. I  encouraged that if she is feeling better and seeing improvements especially in diuresis and output there is no reason to consider dialysis anyway.   [name] and Tamela both express frustration at the constant reminders from providers that Sandrea is dying. They understand that she has a condition that will lead to her passing but are more interested in gaining information and options. I will continue to follow and discuss.   Recommendations/Plan:  Continue current therapies.   Hopes to titrate oxygen down to 10L to allow her to return home.   Will need to consider conversations regarding hospice as well.   OxyIR 5 mg BID prn for pain/SOB and to assist with trying to get her off NRB.   Code Status:  DNR  Prognosis:   Overall very poor. Eligible for hospice if goals aligned with comfort but not desiring full comfort yet. If we can get her oxygen down to 10L may consider home with hospice.   Discharge Planning:  To Be Determined   Thank you for allowing the Palliative Medicine Team to assist in the care of this patient.   Time In: 1050 Time Out: 1200 Total Time 70 min Prolonged Time  Billed  yes       Greater than 50%  of this time was spent counseling and coordinating care related to the above assessment and plan.  Vinie Sill, NP Palliative Medicine Team Pager # [phone] (M-F 8a-5p) Team Phone # [phone] (Nights/Weekends)

## 2019-02-27 NOTE — Progress Notes (Signed)
Subjective:  UOP seems to be up- 575 but missed a shift, pts family said 950 out !  sodium up to 123 - kidney function pretty stable.  Her cortisol stim test seems normal - no result of urine osm    Objective Vital signs in last 24 hours: Vitals:   02/27/19 0214 02/27/19 0500 02/27/19 0608 02/27/19 0900  BP:      Pulse:      Resp:      Temp:   97.6 F (36.4 C)   TempSrc:      SpO2: 98%   95%  Weight:  91 kg    Height:       Weight change:   Intake/Output Summary (Last 24 hours) at 02/27/2019 1241 Last data filed at 02/27/2019 0630 Gross per 24 hour  Intake 600 ml  Output 575 ml  Net 25 ml    Assessment/ Plan: Pt is a 68 y.o. yo female with DM, COPD, pulmonary HTN, RCC s/p cryoablation as well as some baseline mild hyponatremia who was admitted on 02/24/2019 with worsening SOB and A on chronic hyponatremia  Assessment/Plan: 1. Hyponatremia - seeming to be hypervolemic hyponatremia- seems to be ?responding to diuresis and fluid restriction, to continue- antidepressant stopped- still no urine osm, cortisol stim test is OK.   2. A on CRF- baseline crt as high as 2- now in the mid 2's.  Renal ultrasound not revealing of reason, no hydro- just cysts  3. Anemia- hgb in the 7's -  iron stores low , on ferrlecit - will also add ESA 4. Hyperkalemia- better on lokelma- will hold and follow  5. HTN/volume- pulmonary HTN- appears to have volume overload- cont lasix 80 q 8 for now   [place]: Basic Metabolic Panel: Recent Labs  Lab 02/25/19 2052 02/26/19 0813 02/27/19 0932  NA 118* 117* 123*  K 4.8 5.5* 4.7  CL 87* 85* 87*  CO2 19* 19* 24  GLUCOSE 84 153* 91  BUN 34* 40* 49*  CREATININE 2.42* 2.45* 2.57*  CALCIUM 8.8* 8.6* 9.3   Liver Function Tests: Recent Labs  Lab 02/24/19 1150  AST 40  ALT 19  ALKPHOS 53  BILITOT 0.6  PROT 7.1  ALBUMIN 3.9   No results for input(s): LIPASE, AMYLASE in the last 168 hours. No results for input(s): AMMONIA in the  last 168 hours. CBC: Recent Labs  Lab 02/24/19 1150  02/24/19 1450 02/25/19 0630 02/25/19 1550 02/27/19 0932  WBC 9.6  --  10.7* 5.6  --  5.5  NEUTROABS 7.5  --   --   --   --   --   HGB 7.5*   < > 7.6* 6.9* 7.7* 7.7*  HCT 25.4*   < > 24.5* 22.8* 25.1* 24.8*  MCV 78.4*  --  78.0* 78.1*  --  78.7*  PLT 556*  --  543* 548*  --  550*   < > = values in this interval not displayed.   Cardiac Enzymes: No results for input(s): CKTOTAL, CKMB, CKMBINDEX, TROPONINI in the last 168 hours. CBG: Recent Labs  Lab 02/26/19 1714 02/26/19 2119 02/27/19 0606 02/27/19 0830 02/27/19 1217  GLUCAP 170* 103* 100* 88 223*    Iron Studies:  Recent Labs    02/25/19 1758  IRON 31  TIBC 490*  FERRITIN 26   Studies/Results: US Renal  Result Date: 02/26/2019 CLINICAL DATA:  Acute renal failure. EXAM: RENAL / URINARY TRACT ULTRASOUND COMPLETE COMPARISON:  CT  abdomen pelvis 04/27/2018 FINDINGS: Right Kidney: Renal measurements: 11.4 x 5.0 x 4.9 cm = volume: 146.6 mL. Normal renal cortical thickness. No hydronephrosis. Multiple hypoechoic lesions within the right kidney of various sizes measuring up to 2.8 cm, favored to represent cysts although not all are completely characterized. Left Kidney: Renal measurements: 9.9 x 4.6 x 4.5 cm = volume: 107.4 mL. Normal renal cortical thickness and echogenicity. No hydronephrosis. Multiple hypoechoic lesions within the left kidney, some of which represent cysts. Many are incompletely characterized. Bladder: Appears normal for degree of bladder distention. IMPRESSION: No hydronephrosis. Electronically Signed   By: Lovey Newcomer M.D.   On: 02/26/2019 14:56   Ct Chest High Resolution  Result Date: 02/27/2019 CLINICAL DATA:  68 year old female with history of acute on chronic hypoxic respiratory failure. COPD exacerbation. Evaluate for potential interstitial lung disease. EXAM: CT CHEST WITHOUT CONTRAST TECHNIQUE: Multidetector CT imaging of the chest was performed  following the standard protocol without intravenous contrast. High resolution imaging of the lungs, as well as inspiratory and expiratory imaging, was performed. COMPARISON:  Chest CT 06/09/2017. FINDINGS: Cardiovascular: Heart size is mildly enlarged. There is no significant pericardial fluid, thickening or pericardial calcification. There is aortic atherosclerosis, as well as atherosclerosis of the great vessels of the mediastinum and the coronary arteries, including calcified atherosclerotic plaque in the left main, left anterior descending, left circumflex and right coronary arteries. Calcifications of the aortic valve. Dilatation of the pulmonic trunk (3.7 cm in diameter). Mediastinum/Nodes: No pathologically enlarged mediastinal or hilar lymph nodes. Please note that accurate exclusion of hilar adenopathy is limited on noncontrast CT scans. Esophagus is unremarkable in appearance. No axillary lymphadenopathy. Lungs/Pleura: Diffuse bronchial wall thickening with moderate centrilobular and paraseptal emphysema. High-resolution images also demonstrate some ground-glass attenuation and mild interlobular septal thickening most evident throughout the mid to lower lungs bilaterally. No associated significant regions of traction bronchiectasis or honeycombing. Inspiratory and expiratory imaging demonstrates some mild air trapping, indicative of mild small airways disease. Upper Abdomen: Aortic atherosclerosis. Exophytic lesion in the upper pole of the left kidney measuring 2.1 cm in diameter, incompletely characterized on today's noncontrast CT examination, but stable in size and demonstrating new calcifications compared to prior study from 2018, potentially related to prior ablation. Musculoskeletal: There are no aggressive appearing lytic or blastic lesions noted in the visualized portions of the skeleton. IMPRESSION: 1. No definite findings to suggest interstitial lung disease. The ground-glass attenuation and  interlobular septal thickening is favored to reflect a background of mild interstitial pulmonary edema in light of the patient's cardiomegaly, which could suggest mild congestive heart failure. If there is persistent clinical concern for interstitial lung disease, repeat high-resolution chest CT should be obtained in 12 months to assess for temporal changes in the appearance of the lung parenchyma. 2. Diffuse bronchial wall thickening with moderate centrilobular and paraseptal emphysema; imaging findings compatible with the reported clinical history of underlying COPD. 3. Dilatation of the pulmonic trunk (3.7 cm in diameter), concerning for associated pulmonary arterial hypertension. 4. Aortic atherosclerosis, in addition to left main and 3 vessel coronary artery disease. Please note that although the presence of coronary artery calcium documents the presence of coronary artery disease, the severity of this disease and any potential stenosis cannot be assessed on this non-gated CT examination. Assessment for potential risk factor modification, dietary therapy or pharmacologic therapy may be warranted, if clinically indicated. 5. There are calcifications of the aortic valve. Echocardiographic correlation for evaluation of potential valvular dysfunction may be warranted if  clinically indicated. Aortic Atherosclerosis (ICD10-I70.0) and Emphysema (ICD10-J43.9). Electronically Signed   By: Vinnie Langton M.D.   On: 02/27/2019 08:16   Medications: Infusions: . sodium chloride    . ferric gluconate (FERRLECIT/NULECIT) IV 125 mg (02/27/19 1230)    Scheduled Medications: . amoxicillin-clavulanate  1 tablet Oral BID WC  . aspirin EC  81 mg Oral Daily  . atorvastatin  20 mg Oral QPC supper  . brimonidine  1 drop Both Eyes BID  . Chlorhexidine Gluconate Cloth  6 each Topical Q0600  . dorzolamide-timolol  1 drop Both Eyes BID  . enoxaparin (LOVENOX) injection  30 mg Subcutaneous Q24H  . feeding supplement  (ENSURE ENLIVE)  237 mL Oral BID BM  . ferrous sulfate  325 mg Oral QPC supper  . furosemide  80 mg Intravenous Q8H  . glimepiride  2 mg Oral Q breakfast  . insulin aspart  0-20 Units Subcutaneous TID WC  . insulin aspart  0-5 Units Subcutaneous QHS  . insulin aspart  4 Units Subcutaneous TID WC  . ipratropium-albuterol  3 mL Nebulization Q6H  . lamoTRIgine  100 mg Oral QPM  . latanoprost  1 drop Both Eyes QHS  . macitentan  10 mg Oral Daily  . magnesium oxide  200 mg Oral QODAY  . mouth rinse  15 mL Mouth Rinse BID  . methylPREDNISolone (SOLU-MEDROL) injection  40 mg Intravenous Q12H  . mometasone-formoterol  2 puff Inhalation BID  . mupirocin ointment  1 application Nasal BID  . polyethylene glycol  17 g Oral Daily  . sildenafil  20 mg Oral TID  . sodium bicarbonate  1,300 mg Oral TID  . sodium chloride flush  3 mL Intravenous Q12H  . sodium zirconium cyclosilicate  10 g Oral TID    have reviewed scheduled and prn medications.  Physical Exam: General: somnolent but then says she "feels pretty good  "  Heart: tachy Lungs: poor effort- big O2 req Abdomen: soft non tender Extremities: dep edema    02/27/2019,12:41 PM  LOS: 3 days

## 2019-02-27 NOTE — Progress Notes (Signed)
Did not received follow up from lab/phlebotomy about cortisol/ACTH lab instructions. Clarification from Pharmacy was that patient does not need to be NPO. Placed orders for Cortisol/Phlebotomy levels to be drawn.

## 2019-02-27 NOTE — Consult Note (Signed)
NAME:  Elizabeth Thompson, MRN:  [id number], DOB:  01-02-1951, LOS: 3 ADMISSION DATE:  02/24/2019, CONSULTATION DATE:  3/13 REFERRING MD:  sheehan, CHIEF COMPLAINT:  Acute on chronic respiratory failure    Brief History   This is a 68 year old female patient with pulmonary hypertension and chronic hypoxic respiratory failure, presents to the emergency room with acute on chronic respiratory failure in the setting of decompensated diastolic heart failure and cor pulmonale on 3/13  History of present illness    Pleasant 68 year old female patient followed by Dr. Melvyn Novas  in the outpatient setting for chronic hypoxic respiratory failure, pulmonary hypertension   In the setting of emphysema on CT scan but normal PFTs, type 2 diabetes, chronic diastolic heart failure.  On chronic oxygen at 6 L.. Just discharged about 1 week ago following decompensated heart failure and diastolic dysfunction with volume overload in which she was treated with aggressive diuresis and oxygen.   She lives at home with her niece.  The family started to notice worsening shortness of breath about 4 to 5 days prior to presentation with increased episodes of hypoxia with saturations down in the 70s, decreased activity tolerance, intermittent essentially nonproductive cough and 3 pound weight gain.  Because of this her knees continue to titrate oxygen.  When she got up to 7 L but saturations remain low and therefore she came to the emergency room.  On evaluation chest x-ray demonstrated diffuse pulmonary edema.  In the emergency room she was given methylprednisone, supplemental oxygen, and placed on BiPAP.  She was to be admitted by the internal medicine service but critical care was consulted given her acute on chronic respiratory failure.    Past Medical History  Chronic respiratory failure, pulmonary hypertension WHO III,  In the setting of emphysema on CT scan but normal PFTs, type 2 diabetes, chronic diastolic heart failure.  On  chronic oxygen at 6 L.   Significant Hospital Events   3/13 admitted w/ diffuse pulmonary edema and acute on chronic resp failure    Consults:  3/13 pulm  3/15 Nephrology 3/15 Palliative   Procedures:    Significant Diagnostic Tests:  Echo 1/25: Function, EF 55 to 93% grade 1 diastolic dysfunction right ventricular systolic function moderately reduced Micro Data:    Antimicrobials:   Augmentin 3/14>>  Interim history/subjective:  Diuresing well s/p Lasix Remains on NRB, SpO2 100% Very drowsy   Objective   Blood pressure 123/75, pulse 75, temperature 97.6 F (36.4 C), resp. rate 11, height _0  (1.6 m), weight 91 kg, SpO2 95 %.    FiO2 (%):  [100 %] 100 %   Intake/Output Summary (Last 24 hours) at 02/27/2019 1241 Last data filed at 02/27/2019 0630 Gross per 24 hour  Intake 600 ml  Output 575 ml  Net 25 ml   Filed Weights   02/24/19 1600 02/25/19 0431 02/27/19 0500  Weight: 93.4 kg 91.2 kg 91 kg    Examination: General: Obese older adult female, on NRB with some signs of mild respiratory distress  HENT: NCAT, pink mmm, PERRL, trachea midline  Lungs: Some accessory muscle recruitment, 100% SpO2 on NRB. Diminished lung sounds with some crackles  Cardiovascular: RRR, distant heart sounds, 2+ radial pulses, capillary refill < 3 seconds  Abdomen: Obese, soft, round, ndnt, bowel sounds x4 Extremities:  Trace edema BUE BLE  Neuro: Awakens to voice, follows simple commands, drowsy and disoriented Skin: clean, dry, warm, intact without rash   Resolved Hospital Problem list  Assessment & Plan:  Acute on chronic hypoxic respiratory failure in the setting of decompensated pulmonary hypertension, cor pulmonale and diastolic dysfunction with pulmonary edema -I am concerned about underlying sleep apnea plus minus hypoventilation syndrome -No evidence of active infection -Home 6LNC  Plan Continue IV Diuresis Titrate O2 support for SpO2 goal 88-92% Currently on NRB,  can try to de-escalate to [place]  If patient recovers from current hospitalization, would recommend outpatient follow up for OSA COPD evaluation, evaluate for possible trilogy at dc Continue both revatio and OPSUMIT Do not feel that this is COPD exacerbation, would recommend de-escalating systemic steroids   Gold 0 COPD -Has history of central lobar emphysema but normal PFTs -Home [facility]  Plan Continue bronchodilators Wean oxygen as able  As above, would recommend de-escalating systemic steroids  If able to be discharged from hospital, would recommend OP follow up   Chronic renal insufficiency.  Baseline creatinine around 2.2-2.6 Plan Trend BUN/Cr, UOP Diuresing as above Nephrology is following for chronic kidney disease and associated electrolyte abnormalities    Goals of Care: Patient has complex multiorgan failure. Palliative Care is consulting and the patient is DNR/DNI.  The patient and family are hoping to continue treatment for fluid overload and assess daily if there is any benefit. Discussions of comfort care/palliation have commenced with palliative medicine. PCCM has discussed complexity of managing multi-organ dysfunction and patient's caregiver seems to have a grasp on the challenges of current management. Appreciate PCM guidance of goals of care discussions.   Rest per primary   Best practice:  Diet: adv as tol Pain/Anxiety/Delirium protocol (if indicated): NI VAP protocol (if indicated): ni DVT prophylaxis: LMWH GI prophylaxis: PPI Glucose control: ssi Mobility: adv as tol Code Status: dnr Family Communication: niece updated Disposition:  Agree w/ SDU admit. See above plan of care  Labs   CBC: Recent Labs  Lab 02/24/19 1150 02/24/19 1201 02/24/19 1450 02/25/19 0630 02/25/19 1550 02/27/19 0932  WBC 9.6  --  10.7* 5.6  --  5.5  NEUTROABS 7.5  --   --   --   --   --   HGB 7.5* 8.8* 7.6* 6.9* 7.7* 7.7*  HCT 25.4* 26.0* 24.5* 22.8* 25.1* 24.8*  MCV 78.4*  --   78.0* 78.1*  --  78.7*  PLT 556*  --  543* 548*  --  550*    Basic Metabolic Panel: Recent Labs  Lab 02/24/19 1150 02/24/19 1201 02/24/19 1450 02/25/19 0630 02/25/19 2052 02/26/19 0813 02/27/19 0932  NA 122* 120*  --  119* 118* 117* 123*  K 4.6 4.7  --  4.7 4.8 5.5* 4.7  CL 89*  --   --  86* 87* 85* 87*  CO2 21*  --   --  19* 19* 19* 24  GLUCOSE 86  --   --  209* 84 153* 91  BUN 20  --   --  28* 34* 40* 49*  CREATININE 1.96*  --  2.07* 2.22* 2.42* 2.45* 2.57*  CALCIUM 9.1  --   --  9.0 8.8* 8.6* 9.3   GFR: Estimated Creatinine Clearance: 22.4 mL/min (A) (by C-G formula based on SCr of 2.57 mg/dL (H)). Recent Labs  Lab 02/24/19 1150 02/24/19 1450 02/25/19 0630 02/27/19 0932  WBC 9.6 10.7* 5.6 5.5    Liver Function Tests: Recent Labs  Lab 02/24/19 1150  AST 40  ALT 19  ALKPHOS 53  BILITOT 0.6  PROT 7.1  ALBUMIN 3.9   No results for input(s):  LIPASE, AMYLASE in the last 168 hours. No results for input(s): AMMONIA in the last 168 hours.  ABG    Component Value Date/Time   PHART 7.294 (L) 02/26/2019 1550   PCO2ART 46.8 02/26/2019 1550   PO2ART 97.3 02/26/2019 1550   HCO3 22.0 02/26/2019 1550   TCO2 24 02/24/2019 1201   ACIDBASEDEF 3.5 (H) 02/26/2019 1550   O2SAT 97.1 02/26/2019 1550     Coagulation Profile: No results for input(s): INR, PROTIME in the last 168 hours.  Cardiac Enzymes: No results for input(s): CKTOTAL, CKMB, CKMBINDEX, TROPONINI in the last 168 hours.  HbA1C: Hgb A1c MFr Bld  Date/Time Value Ref Range Status  02/24/2019 03:00 PM 6.5 (H) 4.8 - 5.6 % Final    Comment:    (NOTE) Pre diabetes:          5.7%-6.4% Diabetes:              >6.4% Glycemic control for   <7.0% adults with diabetes   02/02/2017 02:28 PM 5.4 4.8 - 5.6 % Final    Comment:    (NOTE)         Pre-diabetes: 5.7 - 6.4         Diabetes: >6.4         Glycemic control for adults with diabetes: <7.0     CBG: Recent Labs  Lab 02/26/19 1714 02/26/19 2119  02/27/19 0606 02/27/19 0830 02/27/19 1217  GLUCAP 170* 103* 100* 88 [place] MSN, AGACNP-BC [place] [phone] If no answer, [phone] 02/27/2019, 3:01 PM

## 2019-02-27 NOTE — Telephone Encounter (Signed)
Tried to call patient, phone went to busy signal. Unable to reach.

## 2019-02-27 NOTE — Progress Notes (Signed)
Physical Therapy Treatment Patient Details Name: Elizabeth Thompson MRN: [id number] DOB: 06/27/1951 Today's Date: 02/27/2019    History of Present Illness  Elizabeth Thompson is a 68 y.o. female with medical history significant of bipolar disorder, pulmonary hypertension with COPD on 6 L nasal cannula at baseline, diabetes type 2 who presented with worsening shortness of breath and a nonproductive cough.  Work up includes acute on chronic hypoxic respiratory failure in the setting of decompensated pulmonary HTN, cor pulmonale and diastolic dysfunction with pulmonary edema.    PT Comments    Pt performed gait training with in room.  Pt is limited due to hypoxia and need for high levels of supplemental O2.  Pt remains motivated.  Appears drowsy during session.  Plan for HHPT remains appropriate.  Pt limited due to respiratory function at this time.     Follow Up Recommendations  Home health PT;Supervision/Assistance - 24 hour     Equipment Recommendations  None recommended by PT    Recommendations for Other Services       Precautions / Restrictions Precautions Precautions: Fall Restrictions Weight Bearing Restrictions: No    Mobility  Bed Mobility Overal bed mobility: Modified Independent             General bed mobility comments: Pt required increased time and effort but able to achieve sitting edge of bed, presents   Transfers Overall transfer level: Needs assistance   Transfers: Sit to/from Stand Sit to Stand: Min assist Stand pivot transfers: Min guard       General transfer comment: min assist for boost into standing.  pt presents with drowsiness due to meds.    Ambulation/Gait Ambulation/Gait assistance: Min assist;+2 safety/equipment Gait Distance (Feet): 4 Feet(+ 10 ft) Assistive device: Rolling walker (2 wheeled) Gait Pattern/deviations: Step-to pattern;Decreased step length - right;Decreased step length - left;Trunk flexed     General Gait Details: Cues  for RW safety, upper trunk control, and increasing stride length.  DOE 3/4.  SPO2 decreased to 87% on 15L NRB mask.  Pt recovered to 97% with seated rest break.     Stairs             Wheelchair Mobility    Modified Rankin (Stroke Patients Only)       Balance Overall balance assessment: Needs assistance Sitting-balance support: No upper extremity supported;Feet supported Sitting balance-Leahy Scale: Fair     Standing balance support: During functional activity;Single extremity supported Standing balance-Leahy Scale: Fair Standing balance comment: pt able to complete pericare with single UE support                            Cognition Arousal/Alertness: Awake/alert Behavior During Therapy: Flat affect Overall Cognitive Status: Within Functional Limits for tasks assessed                                        Exercises      General Comments General comments (skin integrity, edema, etc.): VSS throughout session      Pertinent Vitals/Pain Pain Assessment: No/denies pain    Home Living                      Prior Function            PT Goals (current goals can now be found in the care plan section) Acute  Rehab PT Goals Patient Stated Goal: to get stronger PT Goal Formulation: With patient Potential to Achieve Goals: Good Progress towards PT goals: Progressing toward goals    Frequency    Min 3X/week      PT Plan Current plan remains appropriate    Co-evaluation              AM-PAC PT "6 Clicks" Mobility   Outcome Measure  Help needed turning from your back to your side while in a flat bed without using bedrails?: None Help needed moving from lying on your back to sitting on the side of a flat bed without using bedrails?: None Help needed moving to and from a bed to a chair (including a wheelchair)?: A Little Help needed standing up from a chair using your arms (e.g., wheelchair or bedside chair)?: A  Little Help needed to walk in hospital room?: A Little Help needed climbing 3-5 steps with a railing? : A Little 6 Click Score: 20    End of Session Equipment Utilized During Treatment: Gait belt Activity Tolerance: Patient tolerated treatment well Patient left: in chair;with call bell/phone within reach;with chair alarm set;with family/visitor present Nurse Communication: Mobility status PT Visit Diagnosis: Other abnormalities of gait and mobility (R26.89);Difficulty in walking, not elsewhere classified (R26.2)     Time: 3735-7897 PT Time Calculation (min) (ACUTE ONLY): 27 min  Charges:  $Gait Training: 8-22 mins $Therapeutic Activity: 8-22 mins                     Governor Rooks, PTA Acute Rehabilitation Services Pager [phone] Office [phone]     Rehmat Murtagh Eli Hose 02/27/2019, 4:41 PM

## 2019-02-28 LAB — CBC
HCT: 24.6 % — ABNORMAL LOW (ref 36.0–46.0)
HEMOGLOBIN: 7.6 g/dL — AB (ref 12.0–15.0)
MCH: 24.6 pg — ABNORMAL LOW (ref 26.0–34.0)
MCHC: 30.9 g/dL (ref 30.0–36.0)
MCV: 79.6 fL — ABNORMAL LOW (ref 80.0–100.0)
NRBC: 1 % — AB (ref 0.0–0.2)
Platelets: 498 10*3/uL — ABNORMAL HIGH (ref 150–400)
RBC: 3.09 MIL/uL — ABNORMAL LOW (ref 3.87–5.11)
RDW: 18.2 % — ABNORMAL HIGH (ref 11.5–15.5)
WBC: 6.8 10*3/uL (ref 4.0–10.5)

## 2019-02-28 LAB — RENAL FUNCTION PANEL
Albumin: 3.6 g/dL (ref 3.5–5.0)
Anion gap: 8 (ref 5–15)
BUN: 54 mg/dL — ABNORMAL HIGH (ref 8–23)
CO2: 31 mmol/L (ref 22–32)
Calcium: 9.3 mg/dL (ref 8.9–10.3)
Chloride: 95 mmol/L — ABNORMAL LOW (ref 98–111)
Creatinine, Ser: 2.35 mg/dL — ABNORMAL HIGH (ref 0.44–1.00)
GFR calc Af Amer: 24 mL/min — ABNORMAL LOW (ref >60)
GFR calc non Af Amer: 21 mL/min — ABNORMAL LOW (ref >60)
Glucose, Bld: 97 mg/dL (ref 70–99)
POTASSIUM: 4.1 mmol/L (ref 3.5–5.1)
Phosphorus: 5.5 mg/dL — ABNORMAL HIGH (ref 2.5–4.6)
Sodium: 134 mmol/L — ABNORMAL LOW (ref 135–145)

## 2019-02-28 LAB — GLUCOSE, CAPILLARY
Glucose-Capillary: 97 mg/dL (ref 70–99)
Glucose-Capillary: 155 mg/dL — ABNORMAL HIGH (ref 70–99)
Glucose-Capillary: 69 mg/dL — ABNORMAL LOW (ref 70–99)
Glucose-Capillary: 287 mg/dL — ABNORMAL HIGH (ref 70–99)
Glucose-Capillary: 193 mg/dL — ABNORMAL HIGH (ref 70–99)

## 2019-02-28 LAB — MAGNESIUM: Magnesium: 2.5 mg/dL — ABNORMAL HIGH (ref 1.7–2.4)

## 2019-02-28 MED ORDER — FUROSEMIDE 80 MG PO TABS
80.0000 mg | ORAL_TABLET | Freq: Two times a day (BID) | ORAL | Status: DC
  Administered 2019-03-01 – 2019-03-05 (×10): 80 mg via ORAL
  Filled 2019-02-28 (×10): qty 1

## 2019-02-28 MED ORDER — DEXTROSE 50 % IV SOLN
1.0000 | Freq: Once | INTRAVENOUS | Status: AC
  Administered 2019-02-28: 50 mL via INTRAVENOUS
  Filled 2019-02-28: qty 50

## 2019-02-28 NOTE — Progress Notes (Addendum)
Palliative:  Elizabeth Thompson is much unchanged from yesterday. She is sleepy but arousable and answers all questions appropriately and laughs at jokes during conversation appropriately. She and [name] are pleased with her progress and diuresis. Her sodium is improved creatinine down a little. They are hopeful for further attempts to wean oxygen which I hope is more successful with good diuresis. Pitting edema is much improved in thigh area.   They continue to be hopeful for continues improvement and more time. They wish to take things one day at a time and [name] admits she was very overwhelmed yesterday but feels better today. Therapeutic listening and emotional support provided.   Exam: Lethargic but arousable and oriented. No distress. Fatigued. Decreased breath sounds. VSS on NRB.   Plan: - Continue current plan and treatment interventions - Plan to address desire and option for hospice at home if desired with progressing pulmonary hypertension complicated by cor pulmonale and renal failure   30 min  Vinie Sill, NP Palliative Medicine Team Pager # [phone] (M-F 8a-5p) Team Phone # [phone] (Nights/Weekends)

## 2019-02-28 NOTE — Progress Notes (Signed)
Physical Therapy Treatment Patient Details Name: Elizabeth Thompson MRN: [id number] DOB: Oct 30, 1951 Today's Date: 02/28/2019    History of Present Illness  Elizabeth Thompson is a 68 y.o. female with medical history significant of bipolar disorder, pulmonary hypertension with COPD on 6 L nasal cannula at baseline, diabetes type 2 who presented with worsening shortness of breath and a nonproductive cough.  Work up includes acute on chronic hypoxic respiratory failure in the setting of decompensated pulmonary HTN, cor pulmonale and diastolic dysfunction with pulmonary edema.    PT Comments    Per chart review, Hematocrit 24.6 (below cutoff in [facility] formal PT guidelines); RN reports patient is stable for PT to attempt. Performed modified session today due to low hematocrit with focus on bed exercises for B UEs and LEs including ankle pumps, hip abduction, SLRs, bicep curls, overhead press, and tricep press. Patient with accessory breathing and difficulty with simple cognitive tasks involving counting today. Did not progress mobility due to low Hematocrit. She was left in bed with all needs met, bed alarm active.    Follow Up Recommendations  Home health PT;Supervision/Assistance - 24 hour     Equipment Recommendations  None recommended by PT    Recommendations for Other Services       Precautions / Restrictions Precautions Precautions: Fall;Other (comment) Precaution Comments: watch SpO2/HR  Restrictions Weight Bearing Restrictions: No    Mobility  Bed Mobility               General bed mobility comments: deferred due to low HCT   Transfers                 General transfer comment: deferred due to low HCT   Ambulation/Gait             General Gait Details: deferred due to low HCT    Stairs             Wheelchair Mobility    Modified Rankin (Stroke Patients Only)       Balance Overall balance assessment: Needs assistance Sitting-balance  support: No upper extremity supported;Feet supported Sitting balance-Leahy Scale: Fair     Standing balance support: During functional activity;Single extremity supported   Standing balance comment: pt able to complete pericare with single UE support                            Cognition Arousal/Alertness: Awake/alert Behavior During Therapy: Flat affect Overall Cognitive Status: Within Functional Limits for tasks assessed                                 General Comments: flat affect, pleasant but had difficulty with basic cognitive skills such as counting to 20 today       Exercises      General Comments General comments (skin integrity, edema, etc.): bed exercise focus today due to low HCT, no mobility performed       Pertinent Vitals/Pain Pain Assessment: No/denies pain    Home Living                      Prior Function            PT Goals (current goals can now be found in the care plan section) Acute Rehab PT Goals Patient Stated Goal: to get stronger PT Goal Formulation: With patient Time For Goal Achievement:  03/11/19 Potential to Achieve Goals: Good Progress towards PT goals: Progressing toward goals    Frequency    Min 3X/week      PT Plan Current plan remains appropriate    Co-evaluation              AM-PAC PT "6 Clicks" Mobility   Outcome Measure  Help needed turning from your back to your side while in a flat bed without using bedrails?: None Help needed moving from lying on your back to sitting on the side of a flat bed without using bedrails?: None Help needed moving to and from a bed to a chair (including a wheelchair)?: A Little Help needed standing up from a chair using your arms (e.g., wheelchair or bedside chair)?: A Little Help needed to walk in hospital room?: A Little Help needed climbing 3-5 steps with a railing? : A Little 6 Click Score: 20    End of Session   Activity Tolerance: Patient  tolerated treatment well Patient left: in bed;with bed alarm set;with call bell/phone within reach   PT Visit Diagnosis: Other abnormalities of gait and mobility (R26.89);Difficulty in walking, not elsewhere classified (R26.2)     Time: 4627-0350 PT Time Calculation (min) (ACUTE ONLY): 20 min  Charges:  $Therapeutic Exercise: 8-22 mins                     Deniece Ree PT, DPT, CBIS  Supplemental Physical Therapist [place]    Pager [phone] Acute Rehab Office [phone]

## 2019-02-28 NOTE — Progress Notes (Signed)
PROGRESS NOTE    Elizabeth Thompson  FXT:[id number] DOB: 1951-06-27 DOA: 02/24/2019 PCP: Everardo Beals, NP   Brief Narrative:  68 year old with history of bipolar disorder, pulmonary hypertension, COPD on 6 L nasal cannula, diabetes mellitus type 2 came to the hospital with worsening of shortness of breath and nonproductive cough with hypoxia.  She started receiving IV Solu-Medrol and bronchodilators.  She was initially on BiPAP also subsequently thought there was concerns of fluid overload therefore started on IV diuretics.  Pulmonary, nephrology, cardiology and palliative care teams were consulted.  With aggressive diuresis patient sodium level is improved.   Assessment & Plan:   Principal Problem:   Pulmonary hypertension (HCC) Active Problems:   Diabetes mellitus type 2, controlled, without complications (HCC)   COPD exacerbation (HCC)   COPD GOLD 0    Acute on chronic respiratory failure (HCC)   Goals of care, counseling/discussion   Cor pulmonale, chronic (HCC)   Acute on chronic right-sided heart failure (HCC)   Hyponatremia with excess extracellular fluid volume   Acute hypoxic respiratory failure requiring 15 L nasal cannula/nonrebreather Multifactorial- acute COPD exacerbation Group 3 pulmonary arterial hypertension - Once patient is able to tolerate oral, will transition IV Solu-Medrol to p.o. prednisone.  In the meantime continue Solu-Medrol 40 mg IV every 12 hours.  Aggressive bronchodilator treatment.  Dulera.  Incentive spirometry and flutter valve. - Supplemental oxygen as needed.  Continue sildenafil and OPSUMIT. -On Augmentin -Plan is to transition patient to Lasix oral..  Closely monitor fluid status. - Possibly could benefit from home trilogy?. -Advised patient to get out of bed to chair and if possible progressively ambulate herself.  Hyponatremia -Cortisol stim test-normal.  Significantly improved with diuresis.  Acute on chronic renal failure, stage III  - Creatinine is trended down to 2.35 with diuretics.  Closely monitor urine output.  On Lokelma.  Acute on chronic diastolic congestive heart failure, class IV - Echocardiogram January 2020-normal ejection fraction grade 1 diastolic dysfunction.  Cardiology following.  Getting aggressive diuresis.  Anemia of chronic disease, stage IV.  Baseline creatinine 1.8 - Iron deficiency noted.  Iron supplements  And Epo.  We can consider transfusing her another unit of blood to keep hemoglobin above 8.0.  Essential hypertension -In acceptable range.  Currently getting aggressive diuresis.  Hyperlipidemia -Continue Lipitor 20 mg daily  Diabetes mellitus type 2 -Accu-Cheks and sliding scale.  Continue glipizide.  Metformin is on hold.  Bipolar disorder/dementia -Abilify and Aricept on hold due to hyponatremia  DVT prophylaxis: SCDs Code Status: DNR Family Communication: Nieces at the bedside Disposition Plan: Still to be determined  Consultants:   Pulmonary  Nephrology  Palliative care   Subjective: Patient tells me she feels little better this morning.  Her oxygen levels were weaned down to 10 L.  Apparently she diuresed well overnight but not all of the urine output was recorded.  No new complaints this morning. Still gets sob with minimal exertion.   Review of Systems Otherwise negative except as per HPI, including: General = no fevers, chills, dizziness, malaise, fatigue HEENT/EYES = negative for pain, redness, loss of vision, double vision, blurred vision, loss of hearing, sore throat, hoarseness, dysphagia Cardiovascular= negative for chest pain, palpitation, murmurs, lower extremity swelling Respiratory/lungs= negative forhemoptysis, wheezing Gastrointestinal= negative for nausea, vomiting,, abdominal pain, melena, hematemesis Genitourinary= negative for Dysuria, Hematuria, Change in Urinary Frequency MSK = Negative for arthralgia, myalgias, Back Pain, Joint swelling   Neurology= Negative for headache, seizures, numbness, tingling  Psychiatry= Negative  for anxiety, depression, suicidal and homocidal ideation Allergy/Immunology= Medication/Food allergy as listed  Skin= Negative for Rash, lesions, ulcers, itching   Objective: Vitals:   02/28/19 0126 02/28/19 0312 02/28/19 0455 02/28/19 0741  BP:  133/63 123/69   Pulse: 77 80 89   Resp: _0 Temp:  98 F (36.7 C)    TempSrc:  Axillary    SpO2: 94% 97% 100% 100%  Weight:  90.2 kg    Height:        Intake/Output Summary (Last 24 hours) at 02/28/2019 1127 Last data filed at 02/28/2019 1025 Gross per 24 hour  Intake 166 ml  Output 4425 ml  Net -4259 ml   Filed Weights   02/25/19 0431 02/27/19 0500 02/28/19 0312  Weight: 91.2 kg 91 kg 90.2 kg    Examination:  Constitutional: NAD, calm, comfortable, currently on 10 L nonrebreather, chronically ill-appearing Eyes: PERRL, lids and conjunctivae normal ENMT: Mucous membranes are moist. Posterior pharynx clear of any exudate or lesions.Normal dentition.  Neck: normal, supple, no masses, no thyromegaly Respiratory: Diffuse diminished breath sounds with some bibasilar crackles Cardiovascular: Regular rate and rhythm, no murmurs / rubs / gallops. No extremity edema. 2+ pedal pulses. No carotid bruits.  Abdomen: no tenderness, no masses palpated. No hepatosplenomegaly. Bowel sounds positive.  Musculoskeletal: no clubbing / cyanosis. No joint deformity upper and lower extremities. Good ROM, no contractures. Normal muscle tone.  Skin: no rashes, lesions, ulcers. No induration Neurologic: CN 2-12 grossly intact. Sensation intact, DTR normal. Strength 4/5 in all 4.  Psychiatric: Normal judgment and insight. Alert and oriented x 3. Normal mood.   Data Reviewed:   CBC: Recent Labs  Lab 02/24/19 1150  02/24/19 1450 02/25/19 0630 02/25/19 1550 02/27/19 0932 02/28/19 0559  WBC 9.6  --  10.7* 5.6  --  5.5 6.8  NEUTROABS 7.5  --   --   --   --    --   --   HGB 7.5*   < > 7.6* 6.9* 7.7* 7.7* 7.6*  HCT 25.4*   < > 24.5* 22.8* 25.1* 24.8* 24.6*  MCV 78.4*  --  78.0* 78.1*  --  78.7* 79.6*  PLT 556*  --  543* 548*  --  550* 498*   < > = values in this interval not displayed.   Basic Metabolic Panel: Recent Labs  Lab 02/25/19 0630 02/25/19 2052 02/26/19 0813 02/27/19 0932 02/28/19 0559  NA 119* 118* 117* 123* 134*  K 4.7 4.8 5.5* 4.7 4.1  CL 86* 87* 85* 87* 95*  CO2 19* 19* 19* 24 31  GLUCOSE 209* 84 153* 91 97  BUN 28* 34* 40* 49* 54*  CREATININE 2.22* 2.42* 2.45* 2.57* 2.35*  CALCIUM 9.0 8.8* 8.6* 9.3 9.3  MG  --   --   --   --  2.5*  PHOS  --   --   --   --  5.5*   GFR: Estimated Creatinine Clearance: 24.4 mL/min (A) (by C-G formula based on SCr of 2.35 mg/dL (H)). Liver Function Tests: Recent Labs  Lab 02/24/19 1150 02/28/19 0559  AST 40  --   ALT 19  --   ALKPHOS 53  --   BILITOT 0.6  --   PROT 7.1  --   ALBUMIN 3.9 3.6   No results for input(s): LIPASE, AMYLASE in the last 168 hours. No results for input(s): AMMONIA in the last 168 hours. Coagulation Profile: No results for input(s): INR, PROTIME in  the last 168 hours. Cardiac Enzymes: No results for input(s): CKTOTAL, CKMB, CKMBINDEX, TROPONINI in the last 168 hours. BNP (last 3 results) No results for input(s): PROBNP in the last 8760 hours. HbA1C: No results for input(s): HGBA1C in the last 72 hours. CBG: Recent Labs  Lab 02/27/19 0830 02/27/19 1217 02/27/19 1647 02/27/19 2038 02/28/19 0812  GLUCAP 88 223* 89 194* 97   Lipid Profile: No results for input(s): CHOL, HDL, LDLCALC, TRIG, CHOLHDL, LDLDIRECT in the last 72 hours. Thyroid Function Tests: Recent Labs    02/26/19 1453  TSH 0.308*   Anemia Panel: Recent Labs    02/25/19 1758  VITAMINB12 1,459*  FOLATE 36.1  FERRITIN 26  TIBC 490*  IRON 31  RETICCTPCT 3.8*   Sepsis Labs: No results for input(s): PROCALCITON, LATICACIDVEN in the last 168 hours.  Recent Results (from  the past 240 hour(s))  Respiratory Panel by PCR     Status: None   Collection Time: 02/24/19 11:50 AM  Result Value Ref Range Status   Adenovirus NOT DETECTED NOT DETECTED Final   Coronavirus 229E NOT DETECTED NOT DETECTED Final    Comment: (NOTE) The Coronavirus on the Respiratory Panel, DOES NOT test for the novel  Coronavirus (2019 nCoV)    Coronavirus HKU1 NOT DETECTED NOT DETECTED Final   Coronavirus NL63 NOT DETECTED NOT DETECTED Final   Coronavirus OC43 NOT DETECTED NOT DETECTED Final   Metapneumovirus NOT DETECTED NOT DETECTED Final   Rhinovirus / Enterovirus NOT DETECTED NOT DETECTED Final   Influenza A NOT DETECTED NOT DETECTED Final   Influenza B NOT DETECTED NOT DETECTED Final   Parainfluenza Virus 1 NOT DETECTED NOT DETECTED Final   Parainfluenza Virus 2 NOT DETECTED NOT DETECTED Final   Parainfluenza Virus 3 NOT DETECTED NOT DETECTED Final   Parainfluenza Virus 4 NOT DETECTED NOT DETECTED Final   Respiratory Syncytial Virus NOT DETECTED NOT DETECTED Final   Bordetella pertussis NOT DETECTED NOT DETECTED Final   Chlamydophila pneumoniae NOT DETECTED NOT DETECTED Final   Mycoplasma pneumoniae NOT DETECTED NOT DETECTED Final    Comment: Performed at [facility], [address], [place],  [address]  MRSA PCR Screening     Status: Abnormal   Collection Time: 02/24/19  6:35 PM  Result Value Ref Range Status   MRSA by PCR POSITIVE (A) NEGATIVE Final    Comment:        The GeneXpert MRSA Assay (FDA approved for NASAL specimens only), is one component of a comprehensive MRSA colonization surveillance program. It is not intended to diagnose MRSA infection nor to guide or monitor treatment for MRSA infections. RESULT CALLED TO, READ BACK BY AND VERIFIED WITH: Marguerita Beards RN 2205 02/24/19 A BROWNING Performed at [facility], [place] [address], [place],  [address]          Radiology Studies: US Renal  Result Date: 02/26/2019 CLINICAL DATA:   Acute renal failure. EXAM: RENAL / URINARY TRACT ULTRASOUND COMPLETE COMPARISON:  CT abdomen pelvis 04/27/2018 FINDINGS: Right Kidney: Renal measurements: 11.4 x 5.0 x 4.9 cm = volume: 146.6 mL. Normal renal cortical thickness. No hydronephrosis. Multiple hypoechoic lesions within the right kidney of various sizes measuring up to 2.8 cm, favored to represent cysts although not all are completely characterized. Left Kidney: Renal measurements: 9.9 x 4.6 x 4.5 cm = volume: 107.4 mL. Normal renal cortical thickness and echogenicity. No hydronephrosis. Multiple hypoechoic lesions within the left kidney, some of which represent cysts. Many are  incompletely characterized. Bladder: Appears normal for degree of bladder distention. IMPRESSION: No hydronephrosis. Electronically Signed   By: Lovey Newcomer M.D.   On: 02/26/2019 14:56        Scheduled Meds: . amoxicillin-clavulanate  1 tablet Oral BID WC  . aspirin EC  81 mg Oral Daily  . atorvastatin  20 mg Oral QPC supper  . brimonidine  1 drop Both Eyes BID  . Chlorhexidine Gluconate Cloth  6 each Topical Q0600  . darbepoetin (ARANESP) injection - NON-DIALYSIS  150 mcg Subcutaneous Q Mon-1800  . dorzolamide-timolol  1 drop Both Eyes BID  . enoxaparin (LOVENOX) injection  30 mg Subcutaneous Q24H  . feeding supplement (ENSURE ENLIVE)  237 mL Oral BID BM  . ferrous sulfate  325 mg Oral QPC supper  . furosemide  80 mg Intravenous Q8H  . [START ON 03/01/2019] furosemide  80 mg Oral BID  . glimepiride  2 mg Oral Q breakfast  . insulin aspart  0-20 Units Subcutaneous TID WC  . insulin aspart  0-5 Units Subcutaneous QHS  . insulin aspart  4 Units Subcutaneous TID WC  . ipratropium-albuterol  3 mL Nebulization Q6H  . lamoTRIgine  100 mg Oral QPM  . latanoprost  1 drop Both Eyes QHS  . macitentan  10 mg Oral Daily  . magnesium oxide  200 mg Oral QODAY  . mouth rinse  15 mL Mouth Rinse BID  . methylPREDNISolone (SOLU-MEDROL) injection  40 mg Intravenous Q12H   . mometasone-formoterol  2 puff Inhalation BID  . mupirocin ointment  1 application Nasal BID  . polyethylene glycol  17 g Oral Daily  . sildenafil  20 mg Oral TID  . sodium bicarbonate  1,300 mg Oral TID  . sodium chloride flush  3 mL Intravenous Q12H   Continuous Infusions: . sodium chloride    . ferric gluconate (FERRLECIT/NULECIT) IV 125 mg (02/27/19 1230)     LOS: 4 days   Time spent= 35 mins    Chrissi Crow Arsenio Loader, MD [facility]  If 7PM-7AM, please contact night-coverage [email or web address] 02/28/2019, 11:27 AM

## 2019-02-28 NOTE — Progress Notes (Signed)
Subjective:  UOP 2923, sodium up to 134 - kidney function pretty stable.  No new c/o's    Objective Vital signs in last 24 hours: Vitals:   02/28/19 0126 02/28/19 0312 02/28/19 0455 02/28/19 0741  BP:  133/63 123/69   Pulse: 77 80 89   Resp: _0 Temp:  98 F (36.7 C)    TempSrc:  Axillary    SpO2: 94% 97% 100% 100%  Weight:  90.2 kg    Height:       Weight change: -0.8 kg  Intake/Output Summary (Last 24 hours) at 02/28/2019 1109 Last data filed at 02/28/2019 1025 Gross per 24 hour  Intake 166 ml  Output 4425 ml  Net -4259 ml    Assessment/ Plan: Pt is a 68 y.o. yo female with DM, COPD, pulmonary HTN, RCC s/p cryoablation as well as some baseline mild hyponatremia who was admitted on 02/24/2019 with worsening SOB and A on chronic hyponatremia  Assessment/Plan: 1. Hyponatremia - seeming to be hypervolemic hyponatremia- responded  to diuresis and fluid restriction, to continue- antidepressant stopped.  With BUN and bicarb up will dec lasix to 80 PO BID 2. A on CRF- baseline crt as high as 2- now in the mid 2's.  Renal ultrasound not revealing of reason, no hydro- just cysts - stable renal function  3. Anemia- hgb in the 7's -  iron stores low , on ferrlecit - have added ESA 4. Hyperkalemia- better  5. HTN/volume- pulmonary HTN- appears to also have volume overload- convert lasix to oral as above   Sodium improving and crt stable- renal will sign off, call with questions- ESA would not need to be continued as OP   Louis Meckel    Labs: Basic Metabolic Panel: Recent Labs  Lab 02/26/19 0813 02/27/19 0932 02/28/19 0559  NA 117* 123* 134*  K 5.5* 4.7 4.1  CL 85* 87* 95*  CO2 19* 24 31  GLUCOSE 153* 91 97  BUN 40* 49* 54*  CREATININE 2.45* 2.57* 2.35*  CALCIUM 8.6* 9.3 9.3  PHOS  --   --  5.5*   Liver Function Tests: Recent Labs  Lab 02/24/19 1150 02/28/19 0559  AST 40  --   ALT 19  --   ALKPHOS 53  --   BILITOT 0.6  --   PROT 7.1  --   ALBUMIN  3.9 3.6   No results for input(s): LIPASE, AMYLASE in the last 168 hours. No results for input(s): AMMONIA in the last 168 hours. CBC: Recent Labs  Lab 02/24/19 1150  02/24/19 1450 02/25/19 0630 02/25/19 1550 02/27/19 0932 02/28/19 0559  WBC 9.6  --  10.7* 5.6  --  5.5 6.8  NEUTROABS 7.5  --   --   --   --   --   --   HGB 7.5*   < > 7.6* 6.9* 7.7* 7.7* 7.6*  HCT 25.4*   < > 24.5* 22.8* 25.1* 24.8* 24.6*  MCV 78.4*  --  78.0* 78.1*  --  78.7* 79.6*  PLT 556*  --  543* 548*  --  550* 498*   < > = values in this interval not displayed.   Cardiac Enzymes: No results for input(s): CKTOTAL, CKMB, CKMBINDEX, TROPONINI in the last 168 hours. CBG: Recent Labs  Lab 02/27/19 0830 02/27/19 1217 02/27/19 1647 02/27/19 2038 02/28/19 0812  GLUCAP 88 223* 89 194* 97    Iron Studies:  Recent Labs    02/25/19 1758  IRON 31  TIBC 490*  FERRITIN 26   Studies/Results: US Renal  Result Date: 02/26/2019 CLINICAL DATA:  Acute renal failure. EXAM: RENAL / URINARY TRACT ULTRASOUND COMPLETE COMPARISON:  CT abdomen pelvis 04/27/2018 FINDINGS: Right Kidney: Renal measurements: 11.4 x 5.0 x 4.9 cm = volume: 146.6 mL. Normal renal cortical thickness. No hydronephrosis. Multiple hypoechoic lesions within the right kidney of various sizes measuring up to 2.8 cm, favored to represent cysts although not all are completely characterized. Left Kidney: Renal measurements: 9.9 x 4.6 x 4.5 cm = volume: 107.4 mL. Normal renal cortical thickness and echogenicity. No hydronephrosis. Multiple hypoechoic lesions within the left kidney, some of which represent cysts. Many are incompletely characterized. Bladder: Appears normal for degree of bladder distention. IMPRESSION: No hydronephrosis. Electronically Signed   By: Lovey Newcomer M.D.   On: 02/26/2019 14:56   Medications: Infusions: . sodium chloride    . ferric gluconate (FERRLECIT/NULECIT) IV 125 mg (02/27/19 1230)    Scheduled Medications: .  amoxicillin-clavulanate  1 tablet Oral BID WC  . aspirin EC  81 mg Oral Daily  . atorvastatin  20 mg Oral QPC supper  . brimonidine  1 drop Both Eyes BID  . Chlorhexidine Gluconate Cloth  6 each Topical Q0600  . darbepoetin (ARANESP) injection - NON-DIALYSIS  150 mcg Subcutaneous Q Mon-1800  . dorzolamide-timolol  1 drop Both Eyes BID  . enoxaparin (LOVENOX) injection  30 mg Subcutaneous Q24H  . feeding supplement (ENSURE ENLIVE)  237 mL Oral BID BM  . ferrous sulfate  325 mg Oral QPC supper  . furosemide  80 mg Intravenous Q8H  . glimepiride  2 mg Oral Q breakfast  . insulin aspart  0-20 Units Subcutaneous TID WC  . insulin aspart  0-5 Units Subcutaneous QHS  . insulin aspart  4 Units Subcutaneous TID WC  . ipratropium-albuterol  3 mL Nebulization Q6H  . lamoTRIgine  100 mg Oral QPM  . latanoprost  1 drop Both Eyes QHS  . macitentan  10 mg Oral Daily  . magnesium oxide  200 mg Oral QODAY  . mouth rinse  15 mL Mouth Rinse BID  . methylPREDNISolone (SOLU-MEDROL) injection  40 mg Intravenous Q12H  . mometasone-formoterol  2 puff Inhalation BID  . mupirocin ointment  1 application Nasal BID  . polyethylene glycol  17 g Oral Daily  . sildenafil  20 mg Oral TID  . sodium bicarbonate  1,300 mg Oral TID  . sodium chloride flush  3 mL Intravenous Q12H    have reviewed scheduled and prn medications.  Physical Exam: General: somnolent but then says she "feels pretty good  "  Heart: tachy Lungs: poor effort- big O2 req Abdomen: soft non tender Extremities: dep edema- improved     02/28/2019,11:09 AM  LOS: 4 days

## 2019-02-28 NOTE — Telephone Encounter (Signed)
Called and spoke with Patients niece, Elizabeth Thompson stated Patient was admitted to [facility], 02/24/19, for COPD, and is currently on [place] stated that she has received a unit of blood since being admitted.  [name] stated Patient is doing better.  [name] stated they are beginning to wean her oxygen.  [name] stated she would keep Dr Melvyn Novas updated.  Message routed to Dr Melvyn Novas, as Juluis Rainier

## 2019-03-01 LAB — GLUCOSE, CAPILLARY
Glucose-Capillary: 218 mg/dL — ABNORMAL HIGH (ref 70–99)
Glucose-Capillary: 185 mg/dL — ABNORMAL HIGH (ref 70–99)
Glucose-Capillary: 99 mg/dL (ref 70–99)
Glucose-Capillary: 325 mg/dL — ABNORMAL HIGH (ref 70–99)

## 2019-03-01 LAB — CBC
HEMATOCRIT: 31.8 % — AB (ref 36.0–46.0)
Hemoglobin: 9.4 g/dL — ABNORMAL LOW (ref 12.0–15.0)
MCH: 24 pg — ABNORMAL LOW (ref 26.0–34.0)
MCHC: 29.6 g/dL — ABNORMAL LOW (ref 30.0–36.0)
MCV: 81.3 fL (ref 80.0–100.0)
Platelets: 595 10*3/uL — ABNORMAL HIGH (ref 150–400)
RBC: 3.91 MIL/uL (ref 3.87–5.11)
RDW: 18.9 % — ABNORMAL HIGH (ref 11.5–15.5)
WBC: 15.6 10*3/uL — ABNORMAL HIGH (ref 4.0–10.5)
nRBC: 1.9 % — ABNORMAL HIGH (ref 0.0–0.2)

## 2019-03-01 LAB — BPAM RBC
BLOOD PRODUCT EXPIRATION DATE: 202004162359
BLOOD PRODUCT EXPIRATION DATE: 202004162359
Blood Product Expiration Date: 202004162359
ISSUE DATE / TIME: 202003141113
Unit Type and Rh: 5100
Unit Type and Rh: 5100
Unit Type and Rh: 5100

## 2019-03-01 LAB — TYPE AND SCREEN
ABO/RH(D): O POS
Antibody Screen: NEGATIVE
UNIT DIVISION: 0
Unit division: 0
Unit division: 0

## 2019-03-01 LAB — RENAL FUNCTION PANEL
Albumin: 3.8 g/dL (ref 3.5–5.0)
Anion gap: 15 (ref 5–15)
BUN: 49 mg/dL — AB (ref 8–23)
CHLORIDE: 90 mmol/L — AB (ref 98–111)
CO2: 32 mmol/L (ref 22–32)
CREATININE: 2.22 mg/dL — AB (ref 0.44–1.00)
Calcium: 10 mg/dL (ref 8.9–10.3)
GFR calc Af Amer: 26 mL/min — ABNORMAL LOW (ref >60)
GFR calc non Af Amer: 22 mL/min — ABNORMAL LOW (ref >60)
Glucose, Bld: 176 mg/dL — ABNORMAL HIGH (ref 70–99)
POTASSIUM: 4 mmol/L (ref 3.5–5.1)
Phosphorus: 4 mg/dL (ref 2.5–4.6)
Sodium: 137 mmol/L (ref 135–145)

## 2019-03-01 LAB — MAGNESIUM: Magnesium: 2.3 mg/dL (ref 1.7–2.4)

## 2019-03-01 MED ORDER — IPRATROPIUM-ALBUTEROL 0.5-2.5 (3) MG/3ML IN SOLN
3.0000 mL | Freq: Three times a day (TID) | RESPIRATORY_TRACT | Status: DC
  Administered 2019-03-01 – 2019-03-09 (×25): 3 mL via RESPIRATORY_TRACT
  Filled 2019-03-01 (×25): qty 3

## 2019-03-01 NOTE — Progress Notes (Addendum)
Tx performed and charted by SPTA under direct guidance and supervision of PTA at all times. This session was performed under the supervision of a licensed clinician.   Charting reviewed for accuracy and depicts tx performed and services provided.  Governor Rooks, PTA Acute Rehabilitation Services Pager [phone] Office [phone]    Physical Therapy Treatment Patient Details Name: Elizabeth Thompson MRN: [id number] DOB: [birth year] Today's Date: 03/01/2019    History of Present Illness  Elizabeth Thompson is a 69 y.o. female with medical history significant of bipolar disorder, pulmonary hypertension with COPD on 6 L nasal cannula at baseline, diabetes type 2 who presented with worsening shortness of breath and a nonproductive cough.  Work up includes acute on chronic hypoxic respiratory failure in the setting of decompensated pulmonary HTN, cor pulmonale and diastolic dysfunction with pulmonary edema.    PT Comments    PT co-treat with Lyndel Pleasure (OT). Pt performs bed mobility with modified independence and remains min A for transfers with max cues for hand placement and walker safety. Pt able to progress gait across room to chair from sink with min guard. Pt remains +2 to make any meaningful progress with gait. Pt tolerated interventions well but remains limited d/t fatigue, and requires standing rest breaks and max cues for breathing. Plan to d/c needs to updated to SNF/24 hour supervision and supervising PT to be notified of need for change. Plan to progress pt gait to improve functional mobility and promote independence.   Follow Up Recommendations  SNF;Supervision/Assistance - 24 hour     Equipment Recommendations  None recommended by PT    Recommendations for Other Services       Precautions / Restrictions Precautions Precautions: Fall;Other (comment) Precaution Comments: watch SpO2/HR  Restrictions Weight Bearing Restrictions: No    Mobility  Bed Mobility Overal bed  mobility: Modified Independent                Transfers Overall transfer level: Needs assistance Equipment used: Rolling walker (2 wheeled) Transfers: Sit to/from Stand Sit to Stand: Min assist         General transfer comment: vc/tc's for hand placement and safety with min A for power up from sit to stand.  Ambulation/Gait Ambulation/Gait assistance: Min guard;+2 safety/equipment Gait Distance (Feet): 12 Feet Assistive device: Rolling walker (2 wheeled) Gait Pattern/deviations: Step-to pattern;Decreased step length - right;Decreased step length - left;Trunk flexed     General Gait Details: Pt o2 dropped as low as 77% sp02 and required 1 standing rest break to recover O2.    Stairs             Wheelchair Mobility    Modified Rankin (Stroke Patients Only)       Balance Overall balance assessment: Needs assistance Sitting-balance support: No upper extremity supported;Feet supported Sitting balance-Leahy Scale: Fair     Standing balance support: During functional activity;Single extremity supported Standing balance-Leahy Scale: Fair Standing balance comment: pt able to stand briefly without hands on walker to donn cap for hair                            Cognition Arousal/Alertness: Awake/alert Behavior During Therapy: Flat affect Overall Cognitive Status: Within Functional Limits for tasks assessed                                 General Comments: flat affect, pleasant but had  difficulty with basic cognitive skills such as counting to 20 today       Exercises General Exercises - Lower Extremity Ankle Circles/Pumps: AROM;10 reps;Supine;Both Quad Sets: AROM;10 reps;Supine;Both Hip ABduction/ADduction: AROM;10 reps;Both;Supine    General Comments        Pertinent Vitals/Pain Pain Assessment: No/denies pain    Home Living                      Prior Function            PT Goals (current goals can now be  found in the care plan section) Acute Rehab PT Goals Patient Stated Goal: to get to her chair PT Goal Formulation: With patient Time For Goal Achievement: 03/11/19 Potential to Achieve Goals: Good    Frequency    Min 3X/week      PT Plan Discharge plan needs to be updated    Co-evaluation PT/OT/SLP Co-Evaluation/Treatment: Yes Reason for Co-Treatment: Complexity of the patient's impairments (multi-system involvement) PT goals addressed during session: Mobility/safety with mobility OT goals addressed during session: ADL's and self-care      AM-PAC PT "6 Clicks" Mobility   Outcome Measure  Help needed turning from your back to your side while in a flat bed without using bedrails?: None Help needed moving from lying on your back to sitting on the side of a flat bed without using bedrails?: None Help needed moving to and from a bed to a chair (including a wheelchair)?: A Little Help needed standing up from a chair using your arms (e.g., wheelchair or bedside chair)?: A Little Help needed to walk in hospital room?: A Lot Help needed climbing 3-5 steps with a railing? : A Lot 6 Click Score: 18    End of Session Equipment Utilized During Treatment: Gait belt Activity Tolerance: Patient tolerated treatment well;Patient limited by fatigue Patient left: in chair;with call bell/phone within reach;with family/visitor present(niece present) Nurse Communication: Mobility status       Time: 8910-0262 PT Time Calculation (min) (ACUTE ONLY): 25 min  Charges:  $Gait Training: 8-22 mins                     Maryelizabeth Kaufmann, SPTA   Maryelizabeth Kaufmann 03/01/2019, 10:59 AM

## 2019-03-01 NOTE — Progress Notes (Signed)
Palliative:  Elizabeth Thompson is very lethargic when I visit today. She only arouses for a brief time and she waives at me and goes back to sleep. She is maintaining 86-90 on 8L nasal cannula.   I spoke more with [name] at bedside. [name] has become very optimistic with good diuresis and stabilized renal function. However she continues with severe underlying health issues and respiratory status. I did speak with [name] about my concern that even if Graclynn improves now that we could be back in this same position a week later back at square one. [name] says she is worried about this as well but also shares that this is why she would want Loryn to go to SNF for closer monitoring prior to coming home. I am not sure that this is the answer. I worry that there is not a SNF that will take her on high oxygen needs and reminded [name] that SNF currently are not allowing any visitors. [name] will consider these options (also open to LTAC) when the time comes.   [name] is definitely more hopeful and optimistic in her speech today. I fear overall prognosis continues to be very poor. They continue to be hopeful and wish to continue current treatments. [name] did seem more avoidant of discussion of decline and poor prognosis today over past days.   Exam: Lethargic. No distress. Sleeping. 8L [place].   Plan: - Continue current therapies. - Hopeful for improvement.  - Open to LTAC or SNF rehab options if available and improves enough to pursue.   25 min  Vinie Sill, NP Palliative Medicine Team Pager # [phone] (M-F 8a-5p) Team Phone # [phone] (Nights/Weekends)

## 2019-03-01 NOTE — Progress Notes (Signed)
PROGRESS NOTE    Elizabeth Thompson  ASN:[id number] DOB: [birth year] DOA: 02/24/2019 PCP: Everardo Beals, NP   Brief Narrative:  68 year old with history of bipolar disorder, pulmonary hypertension, COPD on 6 L nasal cannula, diabetes mellitus type 2 came to the hospital with worsening of shortness of breath and nonproductive cough with hypoxia.  She started receiving IV Solu-Medrol and bronchodilators.  She was initially on BiPAP also subsequently thought there was concerns of fluid overload therefore started on IV diuretics.  Pulmonary, nephrology, cardiology and palliative care teams were consulted.  With aggressive diuresis patient sodium level is improved.   Assessment & Plan:   Principal Problem:   Pulmonary hypertension (HCC) Active Problems:   Diabetes mellitus type 2, controlled, without complications (HCC)   COPD exacerbation (HCC)   COPD GOLD 0    Acute on chronic respiratory failure (HCC)   Goals of care, counseling/discussion   Cor pulmonale, chronic (HCC)   Acute on chronic right-sided heart failure (HCC)   Hyponatremia with excess extracellular fluid volume   Acute hypoxic respiratory failure requiring 15 L nasal cannula/nonrebreather Multifactorial- acute COPD exacerbation Group 3 pulmonary arterial hypertension - Once she is able to take oral, will transition IV Solu-Medrol to p.o. prednisone.  Aggressive bronchodilator treatment.  Dulera.  Incentive spirometry and flutter valve. - Supplemental oxygen as needed.  Continue sildenafil and OPSUMIT. -On Augmentin -Now on Lasix 80 mg orally twice daily - Daily needs to work with physical therapy as much as possible. Will have LTAC eval her case.   Hyponatremia, resolved -Cortisol stim test-normal.  Significantly improved with diuresis.  Acute on chronic renal failure, stage III, stable - Creatinine is trended down to 2.22 with diuretics.  Closely monitor urine output.  On Lokelma.  Acute on chronic diastolic  congestive heart failure, class IV - Echocardiogram January 2020-normal ejection fraction grade 1 diastolic dysfunction.  Cardiology following.  Appears to be more euvolemic therefore IV Lasix changed to oral Lasix 80 mg twice daily  Anemia of chronic disease, stage IV.  Baseline creatinine 1.8 - Iron deficiency noted.  Iron supplements  And Epo.  We can consider transfusing her another unit of blood to keep hemoglobin above 8.0.  Essential hypertension -In acceptable range.  Currently getting aggressive diuresis.  Hyperlipidemia -Continue Lipitor 20 mg daily  Diabetes mellitus type 2 -Accu-Cheks and sliding scale.  Continue glipizide.  Metformin is on hold.  Bipolar disorder/dementia -Abilify and Aricept on hold due to hyponatremia  DVT prophylaxis: SCDs Code Status: DNR Family Communication: Nieces at the bedside Disposition Plan: Maintain stepdown status patient is still requiring 15 L of nonrebreather.  Consultants:   Pulmonary  Nephrology  Palliative care   Subjective: Patient tells me symptomatically she feels maybe 5% better than yesterday.  Still gets significantly hypoxic with minimal movement.  Niece is at the bedside.  Review of Systems Otherwise negative except as per HPI, including: General = no fevers, chills, dizziness, malaise, fatigue HEENT/EYES = negative for pain, redness, loss of vision, double vision, blurred vision, loss of hearing, sore throat, hoarseness, dysphagia Cardiovascular= negative for chest pain, palpitation, murmurs, lower extremity swelling Respiratory/lungs= negative for hemoptysis Gastrointestinal= negative for nausea, vomiting,, abdominal pain, melena, hematemesis Genitourinary= negative for Dysuria, Hematuria, Change in Urinary Frequency MSK = Negative for arthralgia, myalgias, Back Pain, Joint swelling  Neurology= Negative for headache, seizures, numbness, tingling  Psychiatry= Negative for anxiety, depression, suicidal and  homocidal ideation Allergy/Immunology= Medication/Food allergy as listed  Skin= Negative for Rash, lesions, ulcers, itching  Objective: Vitals:   03/01/19 0155 03/01/19 0751 03/01/19 0807 03/01/19 1100  BP:  138/80    Pulse:  95  86  Resp:  18  14  Temp:  (!) 97.4 F (36.3 C)    TempSrc:  Oral    SpO2: 92% 92% 94% 93%  Weight:      Height:        Intake/Output Summary (Last 24 hours) at 03/01/2019 1156 Last data filed at 03/01/2019 0753 Gross per 24 hour  Intake 230 ml  Output 1650 ml  Net -1420 ml   Filed Weights   02/25/19 0431 02/27/19 0500 02/28/19 0312  Weight: 91.2 kg 91 kg 90.2 kg    Examination:  Constitutional: NAD, calm, comfortable on 15 L of nonrebreather Eyes: PERRL, lids and conjunctivae normal ENMT: Mucous membranes are moist. Posterior pharynx clear of any exudate or lesions.Normal dentition.  Neck: normal, supple, no masses, no thyromegaly Respiratory: Minimal bibasilar crackles Cardiovascular: Regular rate and rhythm, no murmurs / rubs / gallops. No extremity edema. 2+ pedal pulses. No carotid bruits.  Abdomen: no tenderness, no masses palpated. No hepatosplenomegaly. Bowel sounds positive.  Musculoskeletal: no clubbing / cyanosis. No joint deformity upper and lower extremities. Good ROM, no contractures. Normal muscle tone.  Skin: no rashes, lesions, ulcers. No induration Neurologic: CN 2-12 grossly intact. Sensation intact, DTR normal. Strength 4/5 in all 4.  Psychiatric: Normal judgment and insight. Alert and oriented x 3. Normal mood.   Data Reviewed:   CBC: Recent Labs  Lab 02/24/19 1150  02/24/19 1450 02/25/19 0630 02/25/19 1550 02/27/19 0932 02/28/19 0559 03/01/19 0338  WBC 9.6  --  10.7* 5.6  --  5.5 6.8 15.6*  NEUTROABS 7.5  --   --   --   --   --   --   --   HGB 7.5*   < > 7.6* 6.9* 7.7* 7.7* 7.6* 9.4*  HCT 25.4*   < > 24.5* 22.8* 25.1* 24.8* 24.6* 31.8*  MCV 78.4*  --  78.0* 78.1*  --  78.7* 79.6* 81.3  PLT 556*  --  543*  548*  --  550* 498* 595*   < > = values in this interval not displayed.   Basic Metabolic Panel: Recent Labs  Lab 02/25/19 2052 02/26/19 0813 02/27/19 0932 02/28/19 0559 03/01/19 0338  NA 118* 117* 123* 134* 137  K 4.8 5.5* 4.7 4.1 4.0  CL 87* 85* 87* 95* 90*  CO2 19* 19* 24 31 32  GLUCOSE 84 153* 91 97 176*  BUN 34* 40* 49* 54* 49*  CREATININE 2.42* 2.45* 2.57* 2.35* 2.22*  CALCIUM 8.8* 8.6* 9.3 9.3 10.0  MG  --   --   --  2.5* 2.3  PHOS  --   --   --  5.5* 4.0   GFR: Estimated Creatinine Clearance: 25.8 mL/min (A) (by C-G formula based on SCr of 2.22 mg/dL (H)). Liver Function Tests: Recent Labs  Lab 02/24/19 1150 02/28/19 0559 03/01/19 0338  AST 40  --   --   ALT 19  --   --   ALKPHOS 53  --   --   BILITOT 0.6  --   --   PROT 7.1  --   --   ALBUMIN 3.9 3.6 3.8   No results for input(s): LIPASE, AMYLASE in the last 168 hours. No results for input(s): AMMONIA in the last 168 hours. Coagulation Profile: No results for input(s): INR, PROTIME in the last 168 hours. Cardiac  Enzymes: No results for input(s): CKTOTAL, CKMB, CKMBINDEX, TROPONINI in the last 168 hours. BNP (last 3 results) No results for input(s): PROBNP in the last 8760 hours. HbA1C: No results for input(s): HGBA1C in the last 72 hours. CBG: Recent Labs  Lab 02/28/19 1219 02/28/19 1605 02/28/19 1739 02/28/19 2114 03/01/19 0746  GLUCAP 155* 69* 287* 193* 218*   Lipid Profile: No results for input(s): CHOL, HDL, LDLCALC, TRIG, CHOLHDL, LDLDIRECT in the last 72 hours. Thyroid Function Tests: Recent Labs    02/26/19 1453  TSH 0.308*   Anemia Panel: No results for input(s): VITAMINB12, FOLATE, FERRITIN, TIBC, IRON, RETICCTPCT in the last 72 hours. Sepsis Labs: No results for input(s): PROCALCITON, LATICACIDVEN in the last 168 hours.  Recent Results (from the past 240 hour(s))  Respiratory Panel by PCR     Status: None   Collection Time: 02/24/19 11:50 AM  Result Value Ref Range Status    Adenovirus NOT DETECTED NOT DETECTED Final   Coronavirus 229E NOT DETECTED NOT DETECTED Final    Comment: (NOTE) The Coronavirus on the Respiratory Panel, DOES NOT test for the novel  Coronavirus (2019 nCoV)    Coronavirus HKU1 NOT DETECTED NOT DETECTED Final   Coronavirus NL63 NOT DETECTED NOT DETECTED Final   Coronavirus OC43 NOT DETECTED NOT DETECTED Final   Metapneumovirus NOT DETECTED NOT DETECTED Final   Rhinovirus / Enterovirus NOT DETECTED NOT DETECTED Final   Influenza A NOT DETECTED NOT DETECTED Final   Influenza B NOT DETECTED NOT DETECTED Final   Parainfluenza Virus 1 NOT DETECTED NOT DETECTED Final   Parainfluenza Virus 2 NOT DETECTED NOT DETECTED Final   Parainfluenza Virus 3 NOT DETECTED NOT DETECTED Final   Parainfluenza Virus 4 NOT DETECTED NOT DETECTED Final   Respiratory Syncytial Virus NOT DETECTED NOT DETECTED Final   Bordetella pertussis NOT DETECTED NOT DETECTED Final   Chlamydophila pneumoniae NOT DETECTED NOT DETECTED Final   Mycoplasma pneumoniae NOT DETECTED NOT DETECTED Final    Comment: Performed at [facility], [address] [place], [place] [address]  MRSA PCR Screening     Status: Abnormal   Collection Time: 02/24/19  6:35 PM  Result Value Ref Range Status   MRSA by PCR POSITIVE (A) NEGATIVE Final    Comment:        The GeneXpert MRSA Assay (FDA approved for NASAL specimens only), is one component of a comprehensive MRSA colonization surveillance program. It is not intended to diagnose MRSA infection nor to guide or monitor treatment for MRSA infections. RESULT CALLED TO, READ BACK BY AND VERIFIED WITH: Marguerita Beards RN 2205 02/24/19 A BROWNING Performed at [facility], [place] [address], [place], [place] [address]          Radiology Studies: No results found.      Scheduled Meds: . amoxicillin-clavulanate  1 tablet Oral BID WC  . aspirin EC  81 mg Oral Daily  . atorvastatin  20 mg Oral QPC supper  . brimonidine  1 drop Both  Eyes BID  . Chlorhexidine Gluconate Cloth  6 each Topical Q0600  . darbepoetin (ARANESP) injection - NON-DIALYSIS  150 mcg Subcutaneous Q Mon-1800  . dorzolamide-timolol  1 drop Both Eyes BID  . enoxaparin (LOVENOX) injection  30 mg Subcutaneous Q24H  . feeding supplement (ENSURE ENLIVE)  237 mL Oral BID BM  . ferrous sulfate  325 mg Oral QPC supper  . furosemide  80 mg Oral BID  . glimepiride  2 mg Oral  Q breakfast  . insulin aspart  0-20 Units Subcutaneous TID WC  . insulin aspart  0-5 Units Subcutaneous QHS  . insulin aspart  4 Units Subcutaneous TID WC  . ipratropium-albuterol  3 mL Nebulization TID  . lamoTRIgine  100 mg Oral QPM  . latanoprost  1 drop Both Eyes QHS  . macitentan  10 mg Oral Daily  . magnesium oxide  200 mg Oral QODAY  . mouth rinse  15 mL Mouth Rinse BID  . methylPREDNISolone (SOLU-MEDROL) injection  40 mg Intravenous Q12H  . mometasone-formoterol  2 puff Inhalation BID  . polyethylene glycol  17 g Oral Daily  . sildenafil  20 mg Oral TID  . sodium bicarbonate  1,300 mg Oral TID  . sodium chloride flush  3 mL Intravenous Q12H   Continuous Infusions: . sodium chloride    . ferric gluconate (FERRLECIT/NULECIT) IV 125 mg (02/28/19 1022)     LOS: 5 days   Time spent= 35 mins    Euel Castile Arsenio Loader, MD [facility]  If 7PM-7AM, please contact night-coverage [email or web address] 03/01/2019, 11:56 AM

## 2019-03-01 NOTE — Progress Notes (Signed)
Occupational Therapy Treatment Patient Details Name: Elizabeth Thompson MRN: [id number] DOB: [birth year] Today's Date: 03/01/2019    History of present illness  Elizabeth Thompson is a 68 y.o. female with medical history significant of bipolar disorder, pulmonary hypertension with COPD on 6 L nasal cannula at baseline, diabetes type 2 who presented with worsening shortness of breath and a nonproductive cough.  Work up includes acute on chronic hypoxic respiratory failure in the setting of decompensated pulmonary HTN, cor pulmonale and diastolic dysfunction with pulmonary edema.   OT comments  Pt progressing towards acute OT goals, cardiopulmonary status limiting overall rate of progress at this time. Stood 2 minutes at sink for ADL task with O2 dropping to 87-89 with minimal activity. Lowest O2 reading was 77 during mobility, standing rest break and breathing techniques incorporated. On 15L O2 throughout session with nonrebreathing mask on. Niece present and involved during session. Niece spoke of desire for pt to go to [facility] prior to returning home. Updating d/c recommendation to SNF.   Follow Up Recommendations  SNF    Equipment Recommendations  Other (comment)(defer to next venue)    Recommendations for Other Services      Precautions / Restrictions Precautions Precautions: Fall;Other (comment) Precaution Comments: watch SpO2/HR  Restrictions Weight Bearing Restrictions: No       Mobility Bed Mobility Overal bed mobility: Modified Independent             General bed mobility comments: extra time and effort.  Transfers Overall transfer level: Needs assistance Equipment used: Rolling walker (2 wheeled) Transfers: Sit to/from Stand Sit to Stand: Min assist         General transfer comment: vc/tc's for hand placement and safety with min A for power up from sit to stand. Assist to control descent into recliner at end of session.     Balance Overall balance assessment:  Needs assistance Sitting-balance support: No upper extremity supported;Feet supported Sitting balance-Leahy Scale: Fair     Standing balance support: During functional activity;Single extremity supported Standing balance-Leahy Scale: Fair Standing balance comment: pt able to stand briefly without hands on walker to donn cap for hair                           ADL either performed or assessed with clinical judgement   ADL Overall ADL's : Needs assistance/impaired     Grooming: Brushing hair;Min guard;Standing Grooming Details (indicate cue type and reason): Pt placed and adjusted hair cap in standing position. Min guard for safety. Stood about 2 minutes at sink. O2 dropping to 87-89 with ADL task. Standing rest break prior to walking to recliner.                              Functional mobility during ADLs: Min guard;Rolling walker(chair follow, +2 to progress ambulation) General ADL Comments: Pt completed 1 ADL task standing at sink. Cues for breathing technique and rest breaks. Discussed importance of both rest breaks and activity to build activity tolerance.      Vision       Perception     Praxis      Cognition Arousal/Alertness: Awake/alert Behavior During Therapy: Flat affect Overall Cognitive Status: Within Functional Limits for tasks assessed  General Comments: flat affect, pleasant, minimal verbalizations        Exercises General Exercises - Lower Extremity Ankle Circles/Pumps: AROM;10 reps;Supine;Both Quad Sets: AROM;10 reps;Supine;Both Hip ABduction/ADduction: AROM;10 reps;Both;Supine   Shoulder Instructions       General Comments      Pertinent Vitals/ Pain       Pain Assessment: No/denies pain  Home Living                                          Prior Functioning/Environment              Frequency  Min 2X/week        Progress Toward Goals  OT  Goals(current goals can now be found in the care plan section)  Progress towards OT goals: Progressing toward goals(slowly)  Acute Rehab OT Goals Patient Stated Goal: to get to her chair OT Goal Formulation: With patient Time For Goal Achievement: 03/04/19 Potential to Achieve Goals: Good ADL Goals Pt Will Perform Lower Body Bathing: with modified independence;sit to/from stand Pt Will Perform Lower Body Dressing: with modified independence;sit to/from stand Pt Will Transfer to Toilet: with modified independence;grab bars Additional ADL Goal #1: Pt will complete a standing ADL for 5 min interval with stable o2 stats  Plan Discharge plan needs to be updated    Co-evaluation    PT/OT/SLP Co-Evaluation/Treatment: Yes Reason for Co-Treatment: Complexity of the patient's impairments (multi-system involvement) PT goals addressed during session: Mobility/safety with mobility OT goals addressed during session: ADL's and self-care;Strengthening/ROM      AM-PAC OT "6 Clicks" Daily Activity     Outcome Measure   Help from another person eating meals?: None Help from another person taking care of personal grooming?: A Little Help from another person toileting, which includes using toliet, bedpan, or urinal?: A Little Help from another person bathing (including washing, rinsing, drying)?: A Lot Help from another person to put on and taking off regular upper body clothing?: A Little Help from another person to put on and taking off regular lower body clothing?: A Lot 6 Click Score: 17    End of Session Equipment Utilized During Treatment: Oxygen  OT Visit Diagnosis: Unsteadiness on feet (R26.81);Muscle weakness (generalized) (M62.81)   Activity Tolerance Patient limited by fatigue;Patient tolerated treatment well;Other (comment)(O2 sats dropping at times as low as 77 on 15L)   Patient Left in chair;with call bell/phone within reach;with family/visitor present   Nurse Communication           Time: [phone] OT Time Calculation (min): 26 min  Charges: OT General Charges $OT Visit: 1 Visit OT Treatments $Self Care/Home Management : 8-22 mins  Tyrone Schimke, OT Acute Rehabilitation Services Pager: [phone] Office: [phone]    Hortencia Pilar 03/01/2019, 12:09 PM

## 2019-03-02 ENCOUNTER — Ambulatory Visit: Payer: Self-pay | Admitting: Primary Care

## 2019-03-02 LAB — CBC
HCT: 29.7 % — ABNORMAL LOW (ref 36.0–46.0)
HEMOGLOBIN: 8.6 g/dL — AB (ref 12.0–15.0)
MCH: 24.2 pg — ABNORMAL LOW (ref 26.0–34.0)
MCHC: 29 g/dL — AB (ref 30.0–36.0)
MCV: 83.7 fL (ref 80.0–100.0)
Platelets: 532 10*3/uL — ABNORMAL HIGH (ref 150–400)
RBC: 3.55 MIL/uL — ABNORMAL LOW (ref 3.87–5.11)
RDW: 19.2 % — AB (ref 11.5–15.5)
WBC: 14.1 10*3/uL — ABNORMAL HIGH (ref 4.0–10.5)
nRBC: 3.2 % — ABNORMAL HIGH (ref 0.0–0.2)

## 2019-03-02 LAB — RENAL FUNCTION PANEL
Albumin: 3.5 g/dL (ref 3.5–5.0)
Anion gap: 10 (ref 5–15)
BUN: 41 mg/dL — AB (ref 8–23)
CO2: 35 mmol/L — ABNORMAL HIGH (ref 22–32)
Calcium: 9.6 mg/dL (ref 8.9–10.3)
Chloride: 93 mmol/L — ABNORMAL LOW (ref 98–111)
Creatinine, Ser: 2.04 mg/dL — ABNORMAL HIGH (ref 0.44–1.00)
GFR calc Af Amer: 28 mL/min — ABNORMAL LOW (ref >60)
GFR calc non Af Amer: 24 mL/min — ABNORMAL LOW (ref >60)
Glucose, Bld: 116 mg/dL — ABNORMAL HIGH (ref 70–99)
Phosphorus: 3.6 mg/dL (ref 2.5–4.6)
Potassium: 3.7 mmol/L (ref 3.5–5.1)
Sodium: 138 mmol/L (ref 135–145)

## 2019-03-02 LAB — PATHOLOGIST SMEAR REVIEW

## 2019-03-02 LAB — GLUCOSE, CAPILLARY
GLUCOSE-CAPILLARY: 180 mg/dL — AB (ref 70–99)
GLUCOSE-CAPILLARY: 252 mg/dL — AB (ref 70–99)
Glucose-Capillary: 154 mg/dL — ABNORMAL HIGH (ref 70–99)
Glucose-Capillary: 242 mg/dL — ABNORMAL HIGH (ref 70–99)

## 2019-03-02 LAB — MAGNESIUM: Magnesium: 2.1 mg/dL (ref 1.7–2.4)

## 2019-03-02 NOTE — Progress Notes (Signed)
Patient having difficulty urinating.  Bladder scanned patient. 572 mL noted on the scanner.  Notified K.Schorr, NP.  Will continue to monitor the patient and notify as needed

## 2019-03-02 NOTE — Progress Notes (Signed)
PROGRESS NOTE    Elizabeth Thompson  ZOX:[id number] DOB: 24-Sep-1951 DOA: 02/24/2019 PCP: Everardo Beals, NP   Brief Narrative:  68 year old with history of bipolar disorder, pulmonary hypertension, COPD on 6 L nasal cannula, diabetes mellitus type 2 came to the hospital with worsening of shortness of breath and nonproductive cough with hypoxia.  She started receiving IV Solu-Medrol and bronchodilators.  She was initially on BiPAP also subsequently thought there was concerns of fluid overload therefore started on IV diuretics.  Pulmonary, nephrology, cardiology and palliative care teams were consulted.  With aggressive diuresis patient sodium level is improved.   Assessment & Plan:   Principal Problem:   Pulmonary hypertension (HCC) Active Problems:   Diabetes mellitus type 2, controlled, without complications (HCC)   COPD exacerbation (HCC)   COPD GOLD 0    Acute on chronic respiratory failure (HCC)   Goals of care, counseling/discussion   Cor pulmonale, chronic (HCC)   Acute on chronic right-sided heart failure (HCC)   Hyponatremia with excess extracellular fluid volume   Acute hypoxic respiratory failure requiring 10 L nasal cannula/nonrebreather Multifactorial- acute COPD exacerbation Group 3 pulmonary arterial hypertension - Continue 1 more day of Solu-Medrol 40 mg IV twice daily and will likely transition to oral prednisone.  Aggressive bronchodilator treatments. Dulera.  Incentive spirometry and flutter valve. - Supplemental oxygen as needed.  Continue sildenafil and OPSUMIT. -On Augmentin, check procalcitonin level tomorrow -Now on Lasix 80 mg orally twice daily - Daily needs to work with physical therapy as much as possible. Will have LTAC eval her case.   Hyponatremia, resolved -Cortisol stim test-normal.  Significantly improved with diuresis.  Acute on chronic renal failure, stage III, stable - Creatinine is trended down to 2.04 with diuretics.  Closely monitor urine  output.  On Lokelma.  Acute on chronic diastolic congestive heart failure, class IV - Echocardiogram January 2020-normal ejection fraction grade 1 diastolic dysfunction.  Cardiology following.  Appears to be more euvolemic therefore IV Lasix changed to oral Lasix 80 mg twice daily  Anemia of chronic disease, stage IV.  Baseline creatinine 1.8 - Iron deficiency noted.  Iron supplements  And Epo.  We can consider transfusing her another unit of blood to keep hemoglobin above 8.0.  Essential hypertension -In acceptable range.  Currently getting aggressive diuresis.  Hyperlipidemia -Continue Lipitor 20 mg daily  Diabetes mellitus type 2 -Accu-Cheks and sliding scale.  Continue glipizide.  Metformin is on hold.  Bipolar disorder/dementia -Abilify and Aricept on hold due to hyponatremia  As of now physical therapy is recommending skilled nursing facility  DVT prophylaxis: SCDs Code Status: DNR Family Communication: Nieces at the bedside Disposition Plan: Patient is extremely slow to improve, still requires 10 L of nasal cannula.  Consultants:   Pulmonary  Nephrology  Palliative care   Subjective: Feels a little better than yesterday.  Still gets quite hypoxic with minimal movement. Review of Systems Otherwise negative except as per HPI, including: General = no fevers, chills, dizziness, malaise, fatigue HEENT/EYES = negative for pain, redness, loss of vision, double vision, blurred vision, loss of hearing, sore throat, hoarseness, dysphagia Cardiovascular= negative for chest pain, palpitation, murmurs, lower extremity swelling Respiratory/lungs= negative for shortness of breath, cough, hemoptysis, wheezing, mucus production Gastrointestinal= negative for nausea, vomiting,, abdominal pain, melena, hematemesis Genitourinary= negative for Dysuria, Hematuria, Change in Urinary Frequency MSK = Negative for arthralgia, myalgias, Back Pain, Joint swelling  Neurology= Negative for  headache, seizures, numbness, tingling  Psychiatry= Negative for anxiety, depression, suicidal and  homocidal ideation Allergy/Immunology= Medication/Food allergy as listed  Skin= Negative for Rash, lesions, ulcers, itching    Objective: Vitals:   03/02/19 0001 03/02/19 0500 03/02/19 0610 03/02/19 0838  BP: (!) 118/53  107/64   Pulse: 81  89   Resp: 15  15   Temp: 98.3 F (36.8 C)  98.3 F (36.8 C)   TempSrc: Axillary  Axillary   SpO2: 100%  94% 94%  Weight:  84.2 kg    Height:        Intake/Output Summary (Last 24 hours) at 03/02/2019 1126 Last data filed at 03/02/2019 3710 Gross per 24 hour  Intake 490 ml  Output 1425 ml  Net -935 ml   Filed Weights   02/27/19 0500 02/28/19 0312 03/02/19 0500  Weight: 91 kg 90.2 kg 84.2 kg    Examination:  Constitutional: NAD, calm, comfortable, 10 L nasal cannula Eyes: PERRL, lids and conjunctivae normal ENMT: Mucous membranes are moist. Posterior pharynx clear of any exudate or lesions.Normal dentition.  Neck: normal, supple, no masses, no thyromegaly Respiratory: Diffuse diminished breath sounds but improved, no wheezing Cardiovascular: Regular rate and rhythm, no murmurs / rubs / gallops. No extremity edema. 2+ pedal pulses. No carotid bruits.  Abdomen: no tenderness, no masses palpated. No hepatosplenomegaly. Bowel sounds positive.  Musculoskeletal: no clubbing / cyanosis. No joint deformity upper and lower extremities. Good ROM, no contractures. Normal muscle tone.  Skin: no rashes, lesions, ulcers. No induration Neurologic: CN 2-12 grossly intact. Sensation intact, DTR normal. Strength 4/5 in all 4.  Psychiatric: Normal judgment and insight. Alert and oriented x 3. Normal mood.   Data Reviewed:   CBC: Recent Labs  Lab 02/24/19 1150  02/25/19 0630 02/25/19 1550 02/27/19 0932 02/28/19 0559 03/01/19 0338 03/02/19 0402  WBC 9.6   < > 5.6  --  5.5 6.8 15.6* 14.1*  NEUTROABS 7.5  --   --   --   --   --   --   --   HGB  7.5*   < > 6.9* 7.7* 7.7* 7.6* 9.4* 8.6*  HCT 25.4*   < > 22.8* 25.1* 24.8* 24.6* 31.8* 29.7*  MCV 78.4*   < > 78.1*  --  78.7* 79.6* 81.3 83.7  PLT 556*   < > 548*  --  550* 498* 595* 532*   < > = values in this interval not displayed.   Basic Metabolic Panel: Recent Labs  Lab 02/26/19 0813 02/27/19 0932 02/28/19 0559 03/01/19 0338 03/02/19 0402  NA 117* 123* 134* 137 138  K 5.5* 4.7 4.1 4.0 3.7  CL 85* 87* 95* 90* 93*  CO2 19* 24 31 32 35*  GLUCOSE 153* 91 97 176* 116*  BUN 40* 49* 54* 49* 41*  CREATININE 2.45* 2.57* 2.35* 2.22* 2.04*  CALCIUM 8.6* 9.3 9.3 10.0 9.6  MG  --   --  2.5* 2.3 2.1  PHOS  --   --  5.5* 4.0 3.6   GFR: Estimated Creatinine Clearance: 27.1 mL/min (A) (by C-G formula based on SCr of 2.04 mg/dL (H)). Liver Function Tests: Recent Labs  Lab 02/24/19 1150 02/28/19 0559 03/01/19 0338 03/02/19 0402  AST 40  --   --   --   ALT 19  --   --   --   ALKPHOS 53  --   --   --   BILITOT 0.6  --   --   --   PROT 7.1  --   --   --  ALBUMIN 3.9 3.6 3.8 3.5   No results for input(s): LIPASE, AMYLASE in the last 168 hours. No results for input(s): AMMONIA in the last 168 hours. Coagulation Profile: No results for input(s): INR, PROTIME in the last 168 hours. Cardiac Enzymes: No results for input(s): CKTOTAL, CKMB, CKMBINDEX, TROPONINI in the last 168 hours. BNP (last 3 results) No results for input(s): PROBNP in the last 8760 hours. HbA1C: No results for input(s): HGBA1C in the last 72 hours. CBG: Recent Labs  Lab 03/01/19 0746 03/01/19 1209 03/01/19 1652 03/01/19 2153 03/02/19 0753  GLUCAP 218* 185* 99 325* 154*   Lipid Profile: No results for input(s): CHOL, HDL, LDLCALC, TRIG, CHOLHDL, LDLDIRECT in the last 72 hours. Thyroid Function Tests: No results for input(s): TSH, T4TOTAL, FREET4, T3FREE, THYROIDAB in the last 72 hours. Anemia Panel: No results for input(s): VITAMINB12, FOLATE, FERRITIN, TIBC, IRON, RETICCTPCT in the last 72 hours.  Sepsis Labs: No results for input(s): PROCALCITON, LATICACIDVEN in the last 168 hours.  Recent Results (from the past 240 hour(s))  Respiratory Panel by PCR     Status: None   Collection Time: 02/24/19 11:50 AM  Result Value Ref Range Status   Adenovirus NOT DETECTED NOT DETECTED Final   Coronavirus 229E NOT DETECTED NOT DETECTED Final    Comment: (NOTE) The Coronavirus on the Respiratory Panel, DOES NOT test for the novel  Coronavirus (2019 nCoV)    Coronavirus HKU1 NOT DETECTED NOT DETECTED Final   Coronavirus NL63 NOT DETECTED NOT DETECTED Final   Coronavirus OC43 NOT DETECTED NOT DETECTED Final   Metapneumovirus NOT DETECTED NOT DETECTED Final   Rhinovirus / Enterovirus NOT DETECTED NOT DETECTED Final   Influenza A NOT DETECTED NOT DETECTED Final   Influenza B NOT DETECTED NOT DETECTED Final   Parainfluenza Virus 1 NOT DETECTED NOT DETECTED Final   Parainfluenza Virus 2 NOT DETECTED NOT DETECTED Final   Parainfluenza Virus 3 NOT DETECTED NOT DETECTED Final   Parainfluenza Virus 4 NOT DETECTED NOT DETECTED Final   Respiratory Syncytial Virus NOT DETECTED NOT DETECTED Final   Bordetella pertussis NOT DETECTED NOT DETECTED Final   Chlamydophila pneumoniae NOT DETECTED NOT DETECTED Final   Mycoplasma pneumoniae NOT DETECTED NOT DETECTED Final    Comment: Performed at [facility], [address], [place], [place] [address]  MRSA PCR Screening     Status: Abnormal   Collection Time: 02/24/19  6:35 PM  Result Value Ref Range Status   MRSA by PCR POSITIVE (A) NEGATIVE Final    Comment:        The GeneXpert MRSA Assay (FDA approved for NASAL specimens only), is one component of a comprehensive MRSA colonization surveillance program. It is not intended to diagnose MRSA infection nor to guide or monitor treatment for MRSA infections. RESULT CALLED TO, READ BACK BY AND VERIFIED WITH: Marguerita Beards RN 2205 02/24/19 A BROWNING Performed at [facility], [place] [address],  [place], [place] [address]          Radiology Studies: No results found.      Scheduled Meds: . amoxicillin-clavulanate  1 tablet Oral BID WC  . aspirin EC  81 mg Oral Daily  . atorvastatin  20 mg Oral QPC supper  . brimonidine  1 drop Both Eyes BID  . darbepoetin (ARANESP) injection - NON-DIALYSIS  150 mcg Subcutaneous Q Mon-1800  . dorzolamide-timolol  1 drop Both Eyes BID  . enoxaparin (LOVENOX) injection  30 mg Subcutaneous Q24H  . feeding  supplement (ENSURE ENLIVE)  237 mL Oral BID BM  . ferrous sulfate  325 mg Oral QPC supper  . furosemide  80 mg Oral BID  . glimepiride  2 mg Oral Q breakfast  . insulin aspart  0-20 Units Subcutaneous TID WC  . insulin aspart  0-5 Units Subcutaneous QHS  . insulin aspart  4 Units Subcutaneous TID WC  . ipratropium-albuterol  3 mL Nebulization TID  . lamoTRIgine  100 mg Oral QPM  . latanoprost  1 drop Both Eyes QHS  . macitentan  10 mg Oral Daily  . magnesium oxide  200 mg Oral QODAY  . mouth rinse  15 mL Mouth Rinse BID  . methylPREDNISolone (SOLU-MEDROL) injection  40 mg Intravenous Q12H  . mometasone-formoterol  2 puff Inhalation BID  . polyethylene glycol  17 g Oral Daily  . sildenafil  20 mg Oral TID  . sodium bicarbonate  1,300 mg Oral TID  . sodium chloride flush  3 mL Intravenous Q12H   Continuous Infusions: . sodium chloride    . ferric gluconate (FERRLECIT/NULECIT) IV 125 mg (03/01/19 1309)     LOS: 6 days   Time spent=  35 mins    Amilia Vandenbrink Arsenio Loader, MD [facility]  If 7PM-7AM, please contact night-coverage [email or web address] 03/02/2019, 11:26 AM

## 2019-03-02 NOTE — Consult Note (Signed)
   [facility] CM Inpatient Consult   03/02/2019  Elizabeth Thompson Mar 17, 1951 [id number]   Patient screened for high risk score for unplanned readmissions and hospitalizations to check if potential [facility] services with [facility].  Review of chart from [facility] HL notes that the patient's primary care provider is NOT a [facility] provider.   Will sign off. Outreached inpatient [facility] RNCM via phone for follow up updates and left a voicemail message.   Noted patient has Palliative consult on board as well. For questions contact:   Natividad Brood, RN BSN [facility] Liaison  [phone] business mobile phone Toll free office [phone]

## 2019-03-03 LAB — RENAL FUNCTION PANEL
Albumin: 3.3 g/dL — ABNORMAL LOW (ref 3.5–5.0)
Anion gap: 12 (ref 5–15)
BUN: 40 mg/dL — ABNORMAL HIGH (ref 8–23)
CALCIUM: 9.7 mg/dL (ref 8.9–10.3)
CO2: 35 mmol/L — ABNORMAL HIGH (ref 22–32)
Chloride: 91 mmol/L — ABNORMAL LOW (ref 98–111)
Creatinine, Ser: 2.12 mg/dL — ABNORMAL HIGH (ref 0.44–1.00)
GFR calc non Af Amer: 23 mL/min — ABNORMAL LOW (ref >60)
GFR, EST AFRICAN AMERICAN: 27 mL/min — AB (ref >60)
Glucose, Bld: 112 mg/dL — ABNORMAL HIGH (ref 70–99)
Phosphorus: 4.2 mg/dL (ref 2.5–4.6)
Potassium: 3.7 mmol/L (ref 3.5–5.1)
Sodium: 138 mmol/L (ref 135–145)

## 2019-03-03 LAB — CBC
HCT: 29.8 % — ABNORMAL LOW (ref 36.0–46.0)
Hemoglobin: 8.6 g/dL — ABNORMAL LOW (ref 12.0–15.0)
MCH: 24.8 pg — AB (ref 26.0–34.0)
MCHC: 28.9 g/dL — ABNORMAL LOW (ref 30.0–36.0)
MCV: 85.9 fL (ref 80.0–100.0)
PLATELETS: 483 10*3/uL — AB (ref 150–400)
RBC: 3.47 MIL/uL — AB (ref 3.87–5.11)
RDW: 20.7 % — ABNORMAL HIGH (ref 11.5–15.5)
WBC: 11.8 10*3/uL — ABNORMAL HIGH (ref 4.0–10.5)
nRBC: 2.1 % — ABNORMAL HIGH (ref 0.0–0.2)

## 2019-03-03 LAB — CREATININE, SERUM
Creatinine, Ser: 2.24 mg/dL — ABNORMAL HIGH (ref 0.44–1.00)
GFR calc Af Amer: 25 mL/min — ABNORMAL LOW (ref >60)
GFR calc non Af Amer: 22 mL/min — ABNORMAL LOW (ref >60)

## 2019-03-03 LAB — GLUCOSE, CAPILLARY
Glucose-Capillary: 252 mg/dL — ABNORMAL HIGH (ref 70–99)
Glucose-Capillary: 149 mg/dL — ABNORMAL HIGH (ref 70–99)
Glucose-Capillary: 152 mg/dL — ABNORMAL HIGH (ref 70–99)
Glucose-Capillary: 131 mg/dL — ABNORMAL HIGH (ref 70–99)

## 2019-03-03 LAB — MAGNESIUM: Magnesium: 1.9 mg/dL (ref 1.7–2.4)

## 2019-03-03 LAB — PROCALCITONIN: Procalcitonin: 0.2 ng/mL

## 2019-03-03 MED ORDER — PREDNISONE 20 MG PO TABS: 30.0000 mg | ORAL_TABLET | Freq: Every day | ORAL | Status: DC

## 2019-03-03 MED ORDER — PREDNISONE 20 MG PO TABS: 40.0000 mg | ORAL_TABLET | Freq: Every day | ORAL | Status: DC

## 2019-03-03 MED ORDER — PREDNISONE 20 MG PO TABS: 20.0000 mg | ORAL_TABLET | Freq: Every day | ORAL | Status: DC

## 2019-03-03 MED ORDER — GUAIFENESIN 100 MG/5ML PO SOLN
5.0000 mL | ORAL | Status: DC | PRN
  Administered 2019-03-03: 100 mg via ORAL
  Filled 2019-03-03: qty 5

## 2019-03-03 MED ORDER — PREDNISONE 10 MG PO TABS: 10.0000 mg | ORAL_TABLET | Freq: Every day | ORAL | Status: DC

## 2019-03-03 MED ORDER — BOOST / RESOURCE BREEZE PO LIQD CUSTOM: 1.0000 | Freq: Three times a day (TID) | ORAL | Status: DC

## 2019-03-03 MED ORDER — PREDNISONE 20 MG PO TABS
40.0000 mg | ORAL_TABLET | Freq: Every day | ORAL | Status: AC
  Administered 2019-03-04 – 2019-03-08 (×5): 40 mg via ORAL
  Filled 2019-03-03 (×5): qty 2

## 2019-03-03 NOTE — Progress Notes (Signed)
  Patient seen, chart reviewed.  Patient's niece, Elizabeth Thompson, who is also healthcare POA, is at the bedside.  Elizabeth Thompson looks much improved since when I saw her the previous weekend.  She is more alert, sitting up in a recliner, eating lunch.  She reports feeling better but still is very weak.  She is a 2 person assist to the bedside commode  She, [name], and I discussed options going forward in the setting of COVID19.  Ideally [name] would have like to have pursued skilled nursing for rehab prior to returning to the home but is now reluctant to look at this because of inability to stay with patient in the facility.  She states they have limited support in the home.  [name] is reluctant to talk about hospice support in the home but she may become more willing to address this as opposed to SNF rehab  Plan  DNR Continue to treat the treatable  Prognosis: I would not be surprised if patient met Medicare hospice guidelines for care in the home thus with a prognosis of potentially less than 6 months.  We will continue to talk to patient and niece regarding this is a potential plan  Disposition: Pending  Thank you, Romona Curls, NP Time spent: 15 minutes Greater than 50% of time was spent in counseling and coordination of care

## 2019-03-03 NOTE — Progress Notes (Signed)
Physical Therapy Treatment Patient Details Name: Elizabeth Thompson MRN: [id number] DOB: 18-Dec-1950 Today's Date: 03/03/2019    History of Present Illness  Elizabeth Thompson is a 68 y.o. female with medical history significant of bipolar disorder, pulmonary hypertension with COPD on 6 L nasal cannula at baseline, diabetes type 2 who presented with worsening shortness of breath and a nonproductive cough.  Work up includes acute on chronic hypoxic respiratory failure in the setting of decompensated pulmonary HTN, cor pulmonale and diastolic dysfunction with pulmonary edema.    PT Comments    Pt performed gait training progression to short hall way distances.  She continues to desat on 10 L HFNC to 81 %.  Pt required close chair follow for necessary rest break.  Pt remains motivated.  Plan next session for continued strengthening and gait training.  SNF placement remains appropriate.      Follow Up Recommendations  SNF;Supervision/Assistance - 24 hour     Equipment Recommendations  None recommended by PT    Recommendations for Other Services       Precautions / Restrictions Precautions Precautions: Fall;Other (comment) Precaution Comments: watch SpO2/HR  Restrictions Weight Bearing Restrictions: No    Mobility  Bed Mobility Overal bed mobility: Modified Independent             General bed mobility comments: extra time and effort.  Transfers Overall transfer level: Needs assistance Equipment used: Rolling walker (2 wheeled) Transfers: Sit to/from Stand Sit to Stand: Supervision         General transfer comment: cues for hand placement.   Ambulation/Gait Ambulation/Gait assistance: Min guard;+2 safety/equipment Gait Distance (Feet): 30 Feet(+ 40 ft , + 60 ft) Assistive device: Rolling walker (2 wheeled) Gait Pattern/deviations: Step-to pattern;Decreased step length - right;Decreased step length - left;Trunk flexed     General Gait Details: O2 sats decreased to 81 %  and ends of trials, however SPO2 difficult to obtain reading.  Cues for pacing pursed lip breathing and increasing stride length.     Stairs             Wheelchair Mobility    Modified Rankin (Stroke Patients Only)       Balance Overall balance assessment: Needs assistance   Sitting balance-Leahy Scale: Fair       Standing balance-Leahy Scale: Fair                              Cognition Arousal/Alertness: Awake/alert Behavior During Therapy: WFL for tasks assessed/performed Overall Cognitive Status: Within Functional Limits for tasks assessed                                        Exercises      General Comments        Pertinent Vitals/Pain Pain Assessment: No/denies pain    Home Living                      Prior Function            PT Goals (current goals can now be found in the care plan section) Acute Rehab PT Goals Patient Stated Goal: to get to her chair Potential to Achieve Goals: Good Progress towards PT goals: Progressing toward goals    Frequency    Min 3X/week      PT Plan Current plan  remains appropriate    Co-evaluation              AM-PAC PT "6 Clicks" Mobility   Outcome Measure  Help needed turning from your back to your side while in a flat bed without using bedrails?: None Help needed moving from lying on your back to sitting on the side of a flat bed without using bedrails?: None Help needed moving to and from a bed to a chair (including a wheelchair)?: A Little Help needed standing up from a chair using your arms (e.g., wheelchair or bedside chair)?: A Little Help needed to walk in hospital room?: A Lot Help needed climbing 3-5 steps with a railing? : A Lot 6 Click Score: 18    End of Session Equipment Utilized During Treatment: Gait belt Activity Tolerance: Patient tolerated treatment well;Patient limited by fatigue Patient left: in chair;with call bell/phone within  reach;with family/visitor present(niece present) Nurse Communication: Mobility status PT Visit Diagnosis: Other abnormalities of gait and mobility (R26.89);Difficulty in walking, not elsewhere classified (R26.2)     Time: 3578-9784 PT Time Calculation (min) (ACUTE ONLY): 24 min  Charges:  $Gait Training: 8-22 mins $Therapeutic Activity: 8-22 mins                     Governor Rooks, PTA Acute Rehabilitation Services Pager [phone] Office [phone]     Cristela Blue 03/03/2019, 4:24 PM

## 2019-03-03 NOTE — Care Management Important Message (Signed)
Important Message  Patient Details  Name: Elizabeth Thompson MRN: [id number] Date of Birth: 01-24-1951   Medicare Important Message Given:  Yes    Orbie Pyo 03/03/2019, 3:45 PM

## 2019-03-03 NOTE — Progress Notes (Addendum)
Nutrition Follow Up  DOCUMENTATION CODES:   Obesity unspecified  INTERVENTION:    Boost Breeze po TID, each supplement provides 250 kcal and 9 grams of protein   D/C Ensure Enlive  NUTRITION DIAGNOSIS:   Inadequate oral intake related to (difficulty breathing) as evidenced by (report from RN), improved  GOAL:   Patient will meet greater than or equal to 90% of their needs, progressing  MONITOR:   PO intake, Supplement acceptance, Labs, Skin, Weight trends, I & O's  ASSESSMENT:   68 yo female with PMH of HTN, Bell's palsy, schizo-affective psychosis, HF, HLD, bipolar disorder, DM-2, COPD who was admitted with worsening SOB.  RD unable to speak with patient. Spoke with Judson Roch, RN.  [name] breathing is better; PO intake has improved. PO intake approximately 50% per flowsheet records. She does not like Ensure or Boost Plus supplements.  Palliative Medicine Team is continuing to follow. Pt is a DNR and does not want artificial feeding.  She is eligible for Hospice Services.  Labs & medications reviewed. CBG's [phone].  Diet Order:   Diet Order            Diet Carb Modified Fluid consistency: Thin; Room service appropriate? Yes  Diet effective now             EDUCATION NEEDS:   No education needs have been identified at this time  Skin:  Skin Assessment: Reviewed RN Assessment  Last BM:  3/20  Height:   Ht Readings from Last 1 Encounters:  02/24/19 _0  (1.6 m)   Weight:   Wt Readings from Last 1 Encounters:  03/03/19 84.2 kg   Ideal Body Weight:  52.3 kg  BMI:  Body mass index is 32.88 kg/m.  Estimated Nutritional Needs:   Kcal:  1700-1900  Protein:  90-100 gm  Fluid:  1.8 L  Arthur Holms, RD, LDN Pager #: [phone] After-Hours Pager #: [phone]

## 2019-03-03 NOTE — Progress Notes (Signed)
PROGRESS NOTE    Elizabeth Thompson  TDS:[id number] DOB: December 09, 1951 DOA: 02/24/2019 PCP: Everardo Beals, NP   Brief Narrative:  68 year old with history of bipolar disorder, pulmonary hypertension, COPD on 6 L nasal cannula, diabetes mellitus type 2 came to the hospital with worsening of shortness of breath and nonproductive cough with hypoxia.  She started receiving IV Solu-Medrol and bronchodilators.  She was initially on BiPAP also subsequently thought there was concerns of fluid overload therefore started on IV diuretics.  Pulmonary, nephrology, cardiology and palliative care teams were consulted.  With aggressive diuresis patient sodium level is improved.   Assessment & Plan:   Principal Problem:   Pulmonary hypertension (HCC) Active Problems:   Diabetes mellitus type 2, controlled, without complications (HCC)   COPD exacerbation (HCC)   COPD GOLD 0    Acute on chronic respiratory failure (HCC)   Goals of care, counseling/discussion   Cor pulmonale, chronic (HCC)   Acute on chronic right-sided heart failure (HCC)   Hyponatremia with excess extracellular fluid volume   Acute hypoxic respiratory failure requiring 10 L nasal cannula/nonrebreather Multifactorial- acute COPD exacerbation Group 3 pulmonary arterial hypertension - Transition to PO prednisone.  Aggressive bronchodilator treatments. Dulera.  Incentive spirometry and flutter valve. - Supplemental oxygen as needed.  Continue sildenafil and OPSUMIT. -On Augmentin, check procalcitonin level tomorrow -Cont lasix 67m po daily. - Daily needs to work with physical therapy as much as possible. Will have LTAC eval her case.   Hyponatremia, resolved -Cortisol stim test-normal.  Significantly improved with diuresis.  Acute on chronic renal failure, stage III, stable - Cr 2.12.  Closely monitor urine output.  On Lokelma.  Acute on chronic diastolic congestive heart failure, class IV - Echocardiogram January 2020-normal  ejection fraction grade 1 diastolic dysfunction.  Cardiology following.  Appears to be more euvolemic therefore IV Lasix changed to oral Lasix 80 mg twice daily  Anemia of chronic disease, stage IV.  Baseline creatinine 1.8 - Iron deficiency noted.  Iron supplements  And Epo.  We can consider transfusing her another unit of blood to keep hemoglobin above 8.0.  Essential hypertension -In acceptable range.  Currently getting aggressive diuresis.  Hyperlipidemia -Continue Lipitor 20 mg daily  Diabetes mellitus type 2 -Accu-Cheks and sliding scale.  Continue glipizide.  Metformin is on hold.  Bipolar disorder/dementia -Abilify and Aricept on hold due to hyponatremia  PT - SNF  DVT prophylaxis: SCDs Code Status: DNR Family Communication: Nieces at the bedside Disposition Plan: Slow to improving, still needs 10L [place]   Consultants:   Pulmonary  Nephrology  Palliative care   Subjective: Little stronger. Ambulating with PT but gets hypoxic and short of breath. Little better. Family reports her LE swelling has resolved.   Review of Systems Otherwise negative except as per HPI, including: General = no fevers, chills, dizziness, malaise, fatigue HEENT/EYES = negative for pain, redness, loss of vision, double vision, blurred vision, loss of hearing, sore throat, hoarseness, dysphagia Cardiovascular= negative for chest pain, palpitation, murmurs, lower extremity swelling Respiratory/lungs= negative hemoptysis Gastrointestinal= negative for nausea, vomiting,, abdominal pain, melena, hematemesis Genitourinary= negative for Dysuria, Hematuria, Change in Urinary Frequency MSK = Negative for arthralgia, myalgias, Back Pain, Joint swelling  Neurology= Negative for headache, seizures, numbness, tingling  Psychiatry= Negative for anxiety, depression, suicidal and homocidal ideation Allergy/Immunology= Medication/Food allergy as listed  Skin= Negative for Rash, lesions, ulcers, itching     Objective: Vitals:   03/03/19 0757 03/03/19 0800 03/03/19 0810 03/03/19 1223  BP:  ([name]  127/39  (!) 127/39  Pulse:  (!) 104  76  Resp:  16  16  Temp: 98.1 F (36.7 C)   97.8 F (36.6 C)  TempSrc: Oral   Oral  SpO2:  98% 100% 100%  Weight:      Height:        Intake/Output Summary (Last 24 hours) at 03/03/2019 1233 Last data filed at 03/03/2019 0500 Gross per 24 hour  Intake -  Output 2050 ml  Net -2050 ml   Filed Weights   02/28/19 0312 03/02/19 0500 03/03/19 0500  Weight: 90.2 kg 84.2 kg 84.2 kg    Examination:  Constitutional: NAD, calm, comfortable Eyes: PERRL, lids and conjunctivae normal ENMT: Mucous membranes are moist. Posterior pharynx clear of any exudate or lesions.Normal dentition.  Neck: normal, supple, no masses, no thyromegaly Respiratory: bibasilar crackles Cardiovascular: Regular rate and rhythm, no murmurs / rubs / gallops. No extremity edema. 2+ pedal pulses. No carotid bruits.  Abdomen: no tenderness, no masses palpated. No hepatosplenomegaly. Bowel sounds positive.  Musculoskeletal: no clubbing / cyanosis. No joint deformity upper and lower extremities. Good ROM, no contractures. Normal muscle tone.  Skin: no rashes, lesions, ulcers. No induration Neurologic: CN 2-12 grossly intact. Sensation intact, DTR normal. Strength 5/5 in all 4.  Psychiatric: Normal judgment and insight. Alert and oriented x 3. Normal mood.   Data Reviewed:   CBC: Recent Labs  Lab 02/27/19 0932 02/28/19 0559 03/01/19 0338 03/02/19 0402 03/03/19 0424  WBC 5.5 6.8 15.6* 14.1* 11.8*  HGB 7.7* 7.6* 9.4* 8.6* 8.6*  HCT 24.8* 24.6* 31.8* 29.7* 29.8*  MCV 78.7* 79.6* 81.3 83.7 85.9  PLT 550* 498* 595* 532* 440*   Basic Metabolic Panel: Recent Labs  Lab 02/27/19 0932 02/28/19 0559 03/01/19 0338 03/02/19 0402 03/03/19 0424  NA 123* 134* 137 138 138  K 4.7 4.1 4.0 3.7 3.7  CL 87* 95* 90* 93* 91*  CO2 24 31 32 35* 35*  GLUCOSE 91 97 176* 116* 112*  BUN 49* 54* 49* 41*  40*  CREATININE 2.57* 2.35* 2.22* 2.04* 2.12*  2.24*  CALCIUM 9.3 9.3 10.0 9.6 9.7  MG  --  2.5* 2.3 2.1 1.9  PHOS  --  5.5* 4.0 3.6 4.2   GFR: Estimated Creatinine Clearance: 24.7 mL/min (A) (by C-G formula based on SCr of 2.24 mg/dL (H)). Liver Function Tests: Recent Labs  Lab 02/28/19 0559 03/01/19 0338 03/02/19 0402 03/03/19 0424  ALBUMIN 3.6 3.8 3.5 3.3*   No results for input(s): LIPASE, AMYLASE in the last 168 hours. No results for input(s): AMMONIA in the last 168 hours. Coagulation Profile: No results for input(s): INR, PROTIME in the last 168 hours. Cardiac Enzymes: No results for input(s): CKTOTAL, CKMB, CKMBINDEX, TROPONINI in the last 168 hours. BNP (last 3 results) No results for input(s): PROBNP in the last 8760 hours. HbA1C: No results for input(s): HGBA1C in the last 72 hours. CBG: Recent Labs  Lab 03/02/19 1149 03/02/19 1652 03/02/19 2204 03/03/19 0756 03/03/19 1220  GLUCAP 242* 180* 252* 252* 149*   Lipid Profile: No results for input(s): CHOL, HDL, LDLCALC, TRIG, CHOLHDL, LDLDIRECT in the last 72 hours. Thyroid Function Tests: No results for input(s): TSH, T4TOTAL, FREET4, T3FREE, THYROIDAB in the last 72 hours. Anemia Panel: No results for input(s): VITAMINB12, FOLATE, FERRITIN, TIBC, IRON, RETICCTPCT in the last 72 hours. Sepsis Labs: Recent Labs  Lab 03/03/19 0424  PROCALCITON 0.20    Recent Results (from the past 240 hour(s))  Respiratory Panel by  PCR     Status: None   Collection Time: 02/24/19 11:50 AM  Result Value Ref Range Status   Adenovirus NOT DETECTED NOT DETECTED Final   Coronavirus 229E NOT DETECTED NOT DETECTED Final    Comment: (NOTE) The Coronavirus on the Respiratory Panel, DOES NOT test for the novel  Coronavirus (2019 nCoV)    Coronavirus HKU1 NOT DETECTED NOT DETECTED Final   Coronavirus NL63 NOT DETECTED NOT DETECTED Final   Coronavirus OC43 NOT DETECTED NOT DETECTED Final   Metapneumovirus NOT DETECTED NOT  DETECTED Final   Rhinovirus / Enterovirus NOT DETECTED NOT DETECTED Final   Influenza A NOT DETECTED NOT DETECTED Final   Influenza B NOT DETECTED NOT DETECTED Final   Parainfluenza Virus 1 NOT DETECTED NOT DETECTED Final   Parainfluenza Virus 2 NOT DETECTED NOT DETECTED Final   Parainfluenza Virus 3 NOT DETECTED NOT DETECTED Final   Parainfluenza Virus 4 NOT DETECTED NOT DETECTED Final   Respiratory Syncytial Virus NOT DETECTED NOT DETECTED Final   Bordetella pertussis NOT DETECTED NOT DETECTED Final   Chlamydophila pneumoniae NOT DETECTED NOT DETECTED Final   Mycoplasma pneumoniae NOT DETECTED NOT DETECTED Final    Comment: Performed at [facility], [address], [place], [place] [address]  MRSA PCR Screening     Status: Abnormal   Collection Time: 02/24/19  6:35 PM  Result Value Ref Range Status   MRSA by PCR POSITIVE (A) NEGATIVE Final    Comment:        The GeneXpert MRSA Assay (FDA approved for NASAL specimens only), is one component of a comprehensive MRSA colonization surveillance program. It is not intended to diagnose MRSA infection nor to guide or monitor treatment for MRSA infections. RESULT CALLED TO, READ BACK BY AND VERIFIED WITH: Marguerita Beards RN 2205 02/24/19 A BROWNING Performed at [facility], [place] [address], [place], [place] [address]          Radiology Studies: No results found.      Scheduled Meds: . amoxicillin-clavulanate  1 tablet Oral BID WC  . aspirin EC  81 mg Oral Daily  . atorvastatin  20 mg Oral QPC supper  . brimonidine  1 drop Both Eyes BID  . darbepoetin (ARANESP) injection - NON-DIALYSIS  150 mcg Subcutaneous Q Mon-1800  . dorzolamide-timolol  1 drop Both Eyes BID  . enoxaparin (LOVENOX) injection  30 mg Subcutaneous Q24H  . feeding supplement (ENSURE ENLIVE)  237 mL Oral BID BM  . ferrous sulfate  325 mg Oral QPC supper  . furosemide  80 mg Oral BID  . glimepiride  2 mg Oral Q breakfast  . insulin aspart  0-20 Units  Subcutaneous TID WC  . insulin aspart  0-5 Units Subcutaneous QHS  . insulin aspart  4 Units Subcutaneous TID WC  . ipratropium-albuterol  3 mL Nebulization TID  . lamoTRIgine  100 mg Oral QPM  . latanoprost  1 drop Both Eyes QHS  . macitentan  10 mg Oral Daily  . magnesium oxide  200 mg Oral QODAY  . mouth rinse  15 mL Mouth Rinse BID  . methylPREDNISolone (SOLU-MEDROL) injection  40 mg Intravenous Q12H  . mometasone-formoterol  2 puff Inhalation BID  . polyethylene glycol  17 g Oral Daily  . sildenafil  20 mg Oral TID  . sodium bicarbonate  1,300 mg Oral TID  . sodium chloride flush  3 mL Intravenous Q12H   Continuous Infusions: . sodium chloride    .  ferric gluconate (FERRLECIT/NULECIT) IV 125 mg (03/03/19 0919)     LOS: 7 days   Time spent=  25 mins    Alleene Stoy Arsenio Loader, MD [facility]  If 7PM-7AM, please contact night-coverage [email or web address] 03/03/2019, 12:33 PM

## 2019-03-04 LAB — CBC
HCT: 30.2 % — ABNORMAL LOW (ref 36.0–46.0)
Hemoglobin: 8.9 g/dL — ABNORMAL LOW (ref 12.0–15.0)
MCH: 25.9 pg — ABNORMAL LOW (ref 26.0–34.0)
MCHC: 29.5 g/dL — ABNORMAL LOW (ref 30.0–36.0)
MCV: 88 fL (ref 80.0–100.0)
PLATELETS: 386 10*3/uL (ref 150–400)
RBC: 3.43 MIL/uL — ABNORMAL LOW (ref 3.87–5.11)
RDW: 22.5 % — ABNORMAL HIGH (ref 11.5–15.5)
WBC: 11.6 10*3/uL — ABNORMAL HIGH (ref 4.0–10.5)
nRBC: 0.5 % — ABNORMAL HIGH (ref 0.0–0.2)

## 2019-03-04 LAB — RENAL FUNCTION PANEL
Albumin: 3 g/dL — ABNORMAL LOW (ref 3.5–5.0)
Anion gap: 10 (ref 5–15)
BUN: 37 mg/dL — ABNORMAL HIGH (ref 8–23)
CO2: 35 mmol/L — ABNORMAL HIGH (ref 22–32)
Calcium: 9.4 mg/dL (ref 8.9–10.3)
Chloride: 91 mmol/L — ABNORMAL LOW (ref 98–111)
Creatinine, Ser: 2.1 mg/dL — ABNORMAL HIGH (ref 0.44–1.00)
GFR calc Af Amer: 27 mL/min — ABNORMAL LOW (ref >60)
GFR calc non Af Amer: 24 mL/min — ABNORMAL LOW (ref >60)
Glucose, Bld: 107 mg/dL — ABNORMAL HIGH (ref 70–99)
Phosphorus: 3.8 mg/dL (ref 2.5–4.6)
Potassium: 3 mmol/L — ABNORMAL LOW (ref 3.5–5.1)
Sodium: 136 mmol/L (ref 135–145)

## 2019-03-04 LAB — GLUCOSE, CAPILLARY
GLUCOSE-CAPILLARY: 183 mg/dL — AB (ref 70–99)
Glucose-Capillary: 235 mg/dL — ABNORMAL HIGH (ref 70–99)
Glucose-Capillary: 113 mg/dL — ABNORMAL HIGH (ref 70–99)
Glucose-Capillary: 255 mg/dL — ABNORMAL HIGH (ref 70–99)

## 2019-03-04 LAB — MAGNESIUM: Magnesium: 1.9 mg/dL (ref 1.7–2.4)

## 2019-03-04 MED ORDER — POTASSIUM CHLORIDE CRYS ER 20 MEQ PO TBCR
40.0000 meq | EXTENDED_RELEASE_TABLET | ORAL | Status: AC
  Administered 2019-03-04 (×2): 40 meq via ORAL
  Filled 2019-03-04 (×2): qty 2

## 2019-03-04 MED ORDER — SODIUM CHLORIDE 0.9 % IV SOLN: 510.0000 mg | Freq: Once | INTRAVENOUS | Status: DC

## 2019-03-04 NOTE — Progress Notes (Signed)
Paged [facility] about potassium 3.0.

## 2019-03-04 NOTE — Progress Notes (Signed)
PROGRESS NOTE    Elizabeth Thompson  ERD:[id number] DOB: 06-Jul-1951 DOA: 02/24/2019 PCP: Everardo Beals, NP   Brief Narrative:  68 year old with history of bipolar disorder, pulmonary hypertension, COPD on 6 L nasal cannula, diabetes mellitus type 2 came to the hospital with worsening of shortness of breath and nonproductive cough with hypoxia.  She started receiving IV Solu-Medrol and bronchodilators.  She was initially on BiPAP also subsequently thought there was concerns of fluid overload therefore started on IV diuretics.  Pulmonary, nephrology, cardiology and palliative care teams were consulted.  With aggressive diuresis patient sodium level is improved.   Assessment & Plan:   Principal Problem:   Pulmonary hypertension (HCC) Active Problems:   Diabetes mellitus type 2, controlled, without complications (HCC)   COPD exacerbation (HCC)   COPD GOLD 0    Acute on chronic respiratory failure (HCC)   Goals of care, counseling/discussion   Cor pulmonale, chronic (HCC)   Acute on chronic right-sided heart failure (HCC)   Hyponatremia with excess extracellular fluid volume   Acute hypoxic respiratory failure requiring 10 L nasal cannula/nonrebreather Multifactorial- acute COPD exacerbation Group 3 pulmonary arterial hypertension -Cont PO Prednisone.  Aggressive bronchodilator treatments. Dulera.  Incentive spirometry and flutter valve. - Supplemental oxygen as needed.  Continue sildenafil and OPSUMIT. -On Augmentin, Procal <0.2. Will discontinue Abx.  -Cont lasix 22m po daily. - Daily needs to work with physical therapy as much as possible. Will have LTAC eval her case.   Hyponatremia, resolved -Cortisol stim test-normal.  Significantly improved with diuresis.  Acute on chronic renal failure, stage III, stable - Cr 2.10.  Closely monitor urine output.  On Lokelma.  Acute on chronic diastolic congestive heart failure, class IV - Echocardiogram January 2020-normal ejection  fraction grade 1 diastolic dysfunction.  Cardiology following.  Appears to be more euvolemic therefore IV Lasix changed to oral Lasix 80 mg twice daily  Anemia of chronic disease, stage IV.  Baseline creatinine 1.8 - Iron deficiency noted.  Iron supplements  And Epo.  We can consider transfusing her another unit of blood to keep hemoglobin above 8.0.   Essential hypertension -In acceptable range.  Currently getting aggressive diuresis.  Hyperlipidemia -Continue Lipitor 20 mg daily  Diabetes mellitus type 2 -Accu-Cheks and sliding scale.  Continue glipizide.  Metformin is on hold.  Bipolar disorder/dementia -Abilify and Aricept on hold due to hyponatremia  PT - SNF  DVT prophylaxis: SCDs Code Status: DNR Family Communication: Nieces at the bedside Disposition Plan: VerySlow to improve from resp stand point.   Consultants:   Pulmonary  Nephrology  Palliative care   Subjective: Sob with minimal improvement. Feels slightly better than yesterday.   Review of Systems Otherwise negative except as per HPI, including: General = no fevers, chills, dizziness, malaise, fatigue HEENT/EYES = negative for pain, redness, loss of vision, double vision, blurred vision, loss of hearing, sore throat, hoarseness, dysphagia Cardiovascular= negative for chest pain, palpitation, murmurs, lower extremity swelling Respiratory/lungs= negative for shortness of breath, cough, hemoptysis, wheezing, mucus production Gastrointestinal= negative for nausea, vomiting,, abdominal pain, melena, hematemesis Genitourinary= negative for Dysuria, Hematuria, Change in Urinary Frequency MSK = Negative for arthralgia, myalgias, Back Pain, Joint swelling  Neurology= Negative for headache, seizures, numbness, tingling  Psychiatry= Negative for anxiety, depression, suicidal and homocidal ideation Allergy/Immunology= Medication/Food allergy as listed  Skin= Negative for Rash, lesions, ulcers, itching  Objective:  Vitals:   03/04/19 0003 03/04/19 0628 03/04/19 0756 03/04/19 0804  BP: (!) 100/46 (!) 99/50  Pulse:      Resp: 17 18    Temp: 98.2 F (36.8 C)     TempSrc: Oral     SpO2: 96% 96% 91% (!) 80%  Weight:      Height:        Intake/Output Summary (Last 24 hours) at 03/04/2019 1209 Last data filed at 03/03/2019 2300 Gross per 24 hour  Intake 240 ml  Output 1325 ml  Net -1085 ml   Filed Weights   02/28/19 0312 03/02/19 0500 03/03/19 0500  Weight: 90.2 kg 84.2 kg 84.2 kg    Examination:  Constitutional: NAD, calm, comfortable; on 12L [place] Eyes: PERRL, lids and conjunctivae normal ENMT: Mucous membranes are moist. Posterior pharynx clear of any exudate or lesions.Normal dentition.  Neck: normal, supple, no masses, no thyromegaly Respiratory: clear to auscultation bilaterally, no wheezing, no crackles. Normal respiratory effort. No accessory muscle use.  Cardiovascular: Regular rate and rhythm, no murmurs / rubs / gallops. No extremity edema. 2+ pedal pulses. No carotid bruits.  Abdomen: no tenderness, no masses palpated. No hepatosplenomegaly. Bowel sounds positive.  Musculoskeletal: no clubbing / cyanosis. No joint deformity upper and lower extremities. Good ROM, no contractures. Normal muscle tone.  Skin: no rashes, lesions, ulcers. No induration Neurologic: CN 2-12 grossly intact. Sensation intact, DTR normal. Strength 4/5 in all 4.  Psychiatric: Normal judgment and insight. Alert and oriented x 3. Normal mood.   Data Reviewed:   CBC: Recent Labs  Lab 02/28/19 0559 03/01/19 0338 03/02/19 0402 03/03/19 0424 03/04/19 0415  WBC 6.8 15.6* 14.1* 11.8* 11.6*  HGB 7.6* 9.4* 8.6* 8.6* 8.9*  HCT 24.6* 31.8* 29.7* 29.8* 30.2*  MCV 79.6* 81.3 83.7 85.9 88.0  PLT 498* 595* 532* 483* 433   Basic Metabolic Panel: Recent Labs  Lab 02/28/19 0559 03/01/19 0338 03/02/19 0402 03/03/19 0424 03/04/19 0415  NA 134* 137 138 138 136  K 4.1 4.0 3.7 3.7 3.0*  CL 95* 90* 93* 91* 91*   CO2 31 32 35* 35* 35*  GLUCOSE 97 176* 116* 112* 107*  BUN 54* 49* 41* 40* 37*  CREATININE 2.35* 2.22* 2.04* 2.12*  2.24* 2.10*  CALCIUM 9.3 10.0 9.6 9.7 9.4  MG 2.5* 2.3 2.1 1.9 1.9  PHOS 5.5* 4.0 3.6 4.2 3.8   GFR: Estimated Creatinine Clearance: 26.4 mL/min (A) (by C-G formula based on SCr of 2.1 mg/dL (H)). Liver Function Tests: Recent Labs  Lab 02/28/19 0559 03/01/19 0338 03/02/19 0402 03/03/19 0424 03/04/19 0415  ALBUMIN 3.6 3.8 3.5 3.3* 3.0*   No results for input(s): LIPASE, AMYLASE in the last 168 hours. No results for input(s): AMMONIA in the last 168 hours. Coagulation Profile: No results for input(s): INR, PROTIME in the last 168 hours. Cardiac Enzymes: No results for input(s): CKTOTAL, CKMB, CKMBINDEX, TROPONINI in the last 168 hours. BNP (last 3 results) No results for input(s): PROBNP in the last 8760 hours. HbA1C: No results for input(s): HGBA1C in the last 72 hours. CBG: Recent Labs  Lab 03/03/19 0756 03/03/19 1220 03/03/19 1715 03/03/19 1957 03/04/19 0814  GLUCAP 252* 149* 152* 131* 235*   Lipid Profile: No results for input(s): CHOL, HDL, LDLCALC, TRIG, CHOLHDL, LDLDIRECT in the last 72 hours. Thyroid Function Tests: No results for input(s): TSH, T4TOTAL, FREET4, T3FREE, THYROIDAB in the last 72 hours. Anemia Panel: No results for input(s): VITAMINB12, FOLATE, FERRITIN, TIBC, IRON, RETICCTPCT in the last 72 hours. Sepsis Labs: Recent Labs  Lab 03/03/19 0424  PROCALCITON 0.20    Recent Results (  from the past 240 hour(s))  Respiratory Panel by PCR     Status: None   Collection Time: 02/24/19 11:50 AM  Result Value Ref Range Status   Adenovirus NOT DETECTED NOT DETECTED Final   Coronavirus 229E NOT DETECTED NOT DETECTED Final    Comment: (NOTE) The Coronavirus on the Respiratory Panel, DOES NOT test for the novel  Coronavirus (2019 nCoV)    Coronavirus HKU1 NOT DETECTED NOT DETECTED Final   Coronavirus NL63 NOT DETECTED NOT DETECTED  Final   Coronavirus OC43 NOT DETECTED NOT DETECTED Final   Metapneumovirus NOT DETECTED NOT DETECTED Final   Rhinovirus / Enterovirus NOT DETECTED NOT DETECTED Final   Influenza A NOT DETECTED NOT DETECTED Final   Influenza B NOT DETECTED NOT DETECTED Final   Parainfluenza Virus 1 NOT DETECTED NOT DETECTED Final   Parainfluenza Virus 2 NOT DETECTED NOT DETECTED Final   Parainfluenza Virus 3 NOT DETECTED NOT DETECTED Final   Parainfluenza Virus 4 NOT DETECTED NOT DETECTED Final   Respiratory Syncytial Virus NOT DETECTED NOT DETECTED Final   Bordetella pertussis NOT DETECTED NOT DETECTED Final   Chlamydophila pneumoniae NOT DETECTED NOT DETECTED Final   Mycoplasma pneumoniae NOT DETECTED NOT DETECTED Final    Comment: Performed at [facility], [address] [place], [place] [address]  MRSA PCR Screening     Status: Abnormal   Collection Time: 02/24/19  6:35 PM  Result Value Ref Range Status   MRSA by PCR POSITIVE (A) NEGATIVE Final    Comment:        The GeneXpert MRSA Assay (FDA approved for NASAL specimens only), is one component of a comprehensive MRSA colonization surveillance program. It is not intended to diagnose MRSA infection nor to guide or monitor treatment for MRSA infections. RESULT CALLED TO, READ BACK BY AND VERIFIED WITH: Marguerita Beards RN 2205 02/24/19 A BROWNING Performed at [facility], [place] [address] [place], [place] [address]          Radiology Studies: No results found.      Scheduled Meds: . amoxicillin-clavulanate  1 tablet Oral BID WC  . aspirin EC  81 mg Oral Daily  . atorvastatin  20 mg Oral QPC supper  . brimonidine  1 drop Both Eyes BID  . darbepoetin (ARANESP) injection - NON-DIALYSIS  150 mcg Subcutaneous Q Mon-1800  . dorzolamide-timolol  1 drop Both Eyes BID  . enoxaparin (LOVENOX) injection  30 mg Subcutaneous Q24H  . feeding supplement  1 Container Oral TID BM  . ferrous sulfate  325 mg Oral QPC supper  . furosemide  80  mg Oral BID  . glimepiride  2 mg Oral Q breakfast  . insulin aspart  0-20 Units Subcutaneous TID WC  . insulin aspart  0-5 Units Subcutaneous QHS  . insulin aspart  4 Units Subcutaneous TID WC  . ipratropium-albuterol  3 mL Nebulization TID  . lamoTRIgine  100 mg Oral QPM  . latanoprost  1 drop Both Eyes QHS  . macitentan  10 mg Oral Daily  . magnesium oxide  200 mg Oral QODAY  . mouth rinse  15 mL Mouth Rinse BID  . mometasone-formoterol  2 puff Inhalation BID  . polyethylene glycol  17 g Oral Daily  . predniSONE  40 mg Oral Q breakfast  . sildenafil  20 mg Oral TID  . sodium bicarbonate  1,300 mg Oral TID  . sodium chloride flush  3 mL Intravenous Q12H   Continuous Infusions: .  sodium chloride    . ferric gluconate (FERRLECIT/NULECIT) IV 125 mg (03/03/19 0919)     LOS: 8 days   Time spent=  25 mins     Arsenio Loader, MD [facility]  If 7PM-7AM, please contact night-coverage [email or web address] 03/04/2019, 12:09 PM

## 2019-03-05 LAB — RENAL FUNCTION PANEL
Albumin: 3.1 g/dL — ABNORMAL LOW (ref 3.5–5.0)
Anion gap: 12 (ref 5–15)
BUN: 37 mg/dL — AB (ref 8–23)
CHLORIDE: 93 mmol/L — AB (ref 98–111)
CO2: 32 mmol/L (ref 22–32)
Calcium: 9.6 mg/dL (ref 8.9–10.3)
Creatinine, Ser: 1.99 mg/dL — ABNORMAL HIGH (ref 0.44–1.00)
GFR calc Af Amer: 29 mL/min — ABNORMAL LOW (ref >60)
GFR calc non Af Amer: 25 mL/min — ABNORMAL LOW (ref >60)
Glucose, Bld: 117 mg/dL — ABNORMAL HIGH (ref 70–99)
Phosphorus: 3.9 mg/dL (ref 2.5–4.6)
Potassium: 3.7 mmol/L (ref 3.5–5.1)
Sodium: 137 mmol/L (ref 135–145)

## 2019-03-05 LAB — GLUCOSE, CAPILLARY
Glucose-Capillary: 85 mg/dL (ref 70–99)
Glucose-Capillary: 168 mg/dL — ABNORMAL HIGH (ref 70–99)
Glucose-Capillary: 306 mg/dL — ABNORMAL HIGH (ref 70–99)
Glucose-Capillary: 201 mg/dL — ABNORMAL HIGH (ref 70–99)

## 2019-03-05 MED ORDER — POTASSIUM CHLORIDE CRYS ER 20 MEQ PO TBCR
40.0000 meq | EXTENDED_RELEASE_TABLET | Freq: Once | ORAL | Status: AC
  Administered 2019-03-05: 40 meq via ORAL
  Filled 2019-03-05: qty 2

## 2019-03-05 NOTE — Progress Notes (Signed)
   Palliative medicine progress note  Patient seen, chart reviewed.  Patient is more alert today.  I see slow improvement each day.  She is eating.  Her niece, [name], healthcare power of attorney, is at the bedside.  [name] is concerned that visitation will not be allowed after 6 AM tomorrow morning and is hopeful for Cassondra to get home as soon as medically appropriate.  She has been requiring 10 to 15 L of oxygen.  She has a concentrator at home that does not go up to 15 L.  They feel as if they can get that in the home they would be ready for discharge fairly soon  Spoke to respiratory therapy briefly.  This therapist shared that they do have the capability of combining concentrators to adjust up to 15 L  I also spoke at length about her hospice benefit and how they would provide additional support, 24/7 on-call services, and a good means to keep her in her home, out of the community in the setting of coronavirus 19.  Patient and niece were receptive to this service  Plan Case management consult placed for hospice services in the home as well as need for 15 L concentrator  Anticipate discharge within 24 to 48 hours  Thank you, Romona Curls, NP Time spent: 25 minutes Greater than 50% of visit spent in counseling and coordination of care

## 2019-03-05 NOTE — Plan of Care (Signed)
  Problem: Education: Goal: Knowledge of General Education information will improve Description Including pain rating scale, medication(s)/side effects and non-pharmacologic comfort measures Outcome: Progressing   Problem: Health Behavior/Discharge Planning: Goal: Ability to manage health-related needs will improve Outcome: Progressing   Problem: Clinical Measurements: Goal: Ability to maintain clinical measurements within normal limits will improve Outcome: Progressing Goal: Will remain free from infection Outcome: Progressing Goal: Diagnostic test results will improve Outcome: Progressing Goal: Respiratory complications will improve Outcome: Progressing Goal: Cardiovascular complication will be avoided Outcome: Progressing   Problem: Activity: Goal: Risk for activity intolerance will decrease Outcome: Progressing   Problem: Nutrition: Goal: Adequate nutrition will be maintained Outcome: Progressing   Problem: Coping: Goal: Level of anxiety will decrease Outcome: Progressing   Problem: Elimination: Goal: Will not experience complications related to bowel motility Outcome: Progressing Goal: Will not experience complications related to urinary retention Outcome: Progressing   Problem: Pain Managment: Goal: General experience of comfort will improve Outcome: Progressing   Problem: Safety: Goal: Ability to remain free from injury will improve Outcome: Progressing   Problem: Skin Integrity: Goal: Risk for impaired skin integrity will decrease Outcome: Progressing   Problem: Education: Goal: Knowledge of disease or condition will improve Outcome: Progressing Goal: Knowledge of the prescribed therapeutic regimen will improve Outcome: Progressing Goal: Individualized Educational Video(s) Outcome: Progressing   Problem: Activity: Goal: Ability to tolerate increased activity will improve Outcome: Progressing Goal: Will verbalize the importance of balancing  activity with adequate rest periods Outcome: Progressing   Problem: Respiratory: Goal: Ability to maintain a clear airway will improve Outcome: Progressing Goal: Levels of oxygenation will improve Outcome: Progressing Goal: Ability to maintain adequate ventilation will improve Outcome: Progressing   Problem: Education: Goal: Ability to demonstrate management of disease process will improve Outcome: Progressing Goal: Ability to verbalize understanding of medication therapies will improve Outcome: Progressing Goal: Individualized Educational Video(s) Outcome: Progressing   Problem: Activity: Goal: Capacity to carry out activities will improve Outcome: Progressing   Problem: Cardiac: Goal: Ability to achieve and maintain adequate cardiopulmonary perfusion will improve Outcome: Progressing

## 2019-03-05 NOTE — Progress Notes (Signed)
PROGRESS NOTE    Elizabeth Thompson  UEA:[id number] DOB: 1951/06/30 DOA: 02/24/2019 PCP: Everardo Beals, NP   Brief Narrative:  68 year old with history of bipolar disorder, pulmonary hypertension, COPD on 6 L nasal cannula, diabetes mellitus type 2 came to the hospital with worsening of shortness of breath and nonproductive cough with hypoxia.  She started receiving IV Solu-Medrol and bronchodilators.  She was initially on BiPAP also subsequently thought there was concerns of fluid overload therefore started on IV diuretics.  Pulmonary, nephrology, cardiology and palliative care teams were consulted.  With aggressive diuresis patient sodium level is improved.   Assessment & Plan:   Principal Problem:   Pulmonary hypertension (HCC) Active Problems:   Diabetes mellitus type 2, controlled, without complications (HCC)   COPD exacerbation (HCC)   COPD GOLD 0    Acute on chronic respiratory failure (HCC)   Goals of care, counseling/discussion   Cor pulmonale, chronic (HCC)   Acute on chronic right-sided heart failure (HCC)   Hyponatremia with excess extracellular fluid volume   Acute hypoxic respiratory failure requiring 10 L nasal cannula/nonrebreather Multifactorial- acute COPD exacerbation Group 3 pulmonary arterial hypertension -Cont Oral Prednisone.  Aggressive bronchodilator treatments. Dulera.  Incentive spirometry and flutter valve. - Supplemental oxygen as needed.  Continue sildenafil and OPSUMIT. -On Augmentin, Procal <0.2. Off Abx now. -Cont Lasix 57m po BID - Daily needs to work with physical therapy as much as possible. Will have LTAC eval her case.   Hyponatremia, resolved -Cortisol stim test-normal.  Significantly improved with diuresis.  Acute on chronic renal failure, stage III, stable - Cr 1.99.  Closely monitor urine output.  On Lokelma.  Acute on chronic diastolic congestive heart failure, class IV - Echocardiogram January 2020-normal ejection fraction grade  1 diastolic dysfunction.  Cardiology following.  Appears to be more euvolemic therefore IV Lasix changed to oral Lasix 80 mg twice daily  Anemia of chronic disease, stage IV.  Baseline creatinine 1.8 - Iron deficiency noted.  Iron supplements  And Epo.  We can consider transfusing her another unit of blood to keep hemoglobin above 8.0.   Essential hypertension -In acceptable range.  Currently getting aggressive diuresis.  Hyperlipidemia -Continue Lipitor 20 mg daily  Diabetes mellitus type 2 -Accu-Cheks and sliding scale.  Continue glipizide.  Metformin is on hold.  Bipolar disorder/dementia -Abilify and Aricept on hold due to hyponatremia  PT - SNF  DVT prophylaxis: SCDs Code Status: DNR Family Communication: Nieces at the bedside Disposition Plan: Continues to improve very slowly. Will consider LTAC coming week.   Consultants:   Pulmonary  Nephrology  Palliative care   Subjective: Little improvement in sob today.   Review of Systems Otherwise negative except as per HPI, including: General = no fevers, chills, dizziness, malaise, fatigue HEENT/EYES = negative for pain, redness, loss of vision, double vision, blurred vision, loss of hearing, sore throat, hoarseness, dysphagia Cardiovascular= negative for chest pain, palpitation, murmurs, lower extremity swelling Respiratory/lungs= negative for  hemoptysis, wheezing, mucus production Gastrointestinal= negative for nausea, vomiting,, abdominal pain, melena, hematemesis Genitourinary= negative for Dysuria, Hematuria, Change in Urinary Frequency MSK = Negative for arthralgia, myalgias, Back Pain, Joint swelling  Neurology= Negative for headache, seizures, numbness, tingling  Psychiatry= Negative for anxiety, depression, suicidal and homocidal ideation Allergy/Immunology= Medication/Food allergy as listed  Skin= Negative for Rash, lesions, ulcers, itching   Objective: Vitals:   03/04/19 2022 03/04/19 2027 03/05/19 0758  03/05/19 1014  BP:   (!) 126/50   Pulse: 96  Resp: 18     Temp:   (!) 97.2 F (36.2 C)   TempSrc:   Oral   SpO2: 93% (!) 87% 100% 100%  Weight:      Height:        Intake/Output Summary (Last 24 hours) at 03/05/2019 1113 Last data filed at 03/04/2019 1920 Gross per 24 hour  Intake 320 ml  Output 650 ml  Net -330 ml   Filed Weights   02/28/19 0312 03/02/19 0500 03/03/19 0500  Weight: 90.2 kg 84.2 kg 84.2 kg    Examination:  Constitutional: NAD, calm, comfortable Eyes: PERRL, lids and conjunctivae normal ENMT: Mucous membranes are moist. Posterior pharynx clear of any exudate or lesions.Normal dentition.  Neck: normal, supple, no masses, no thyromegaly Respiratory: bibasilar crackles.  Cardiovascular: Regular rate and rhythm, no murmurs / rubs / gallops. No extremity edema. 2+ pedal pulses. No carotid bruits.  Abdomen: no tenderness, no masses palpated. No hepatosplenomegaly. Bowel sounds positive.  Musculoskeletal: no clubbing / cyanosis. No joint deformity upper and lower extremities. Good ROM, no contractures. Normal muscle tone.  Skin: no rashes, lesions, ulcers. No induration Neurologic: CN 2-12 grossly intact. Sensation intact, DTR normal. Strength 4/5 in all 4.  Psychiatric: Normal judgment and insight. Alert and oriented x 3. Normal mood.    Data Reviewed:   CBC: Recent Labs  Lab 02/28/19 0559 03/01/19 0338 03/02/19 0402 03/03/19 0424 03/04/19 0415  WBC 6.8 15.6* 14.1* 11.8* 11.6*  HGB 7.6* 9.4* 8.6* 8.6* 8.9*  HCT 24.6* 31.8* 29.7* 29.8* 30.2*  MCV 79.6* 81.3 83.7 85.9 88.0  PLT 498* 595* 532* 483* 027   Basic Metabolic Panel: Recent Labs  Lab 02/28/19 0559 03/01/19 0338 03/02/19 0402 03/03/19 0424 03/04/19 0415 03/05/19 0447  NA 134* 137 138 138 136 137  K 4.1 4.0 3.7 3.7 3.0* 3.7  CL 95* 90* 93* 91* 91* 93*  CO2 31 32 35* 35* 35* 32  GLUCOSE 97 176* 116* 112* 107* 117*  BUN 54* 49* 41* 40* 37* 37*  CREATININE 2.35* 2.22* 2.04* 2.12*   2.24* 2.10* 1.99*  CALCIUM 9.3 10.0 9.6 9.7 9.4 9.6  MG 2.5* 2.3 2.1 1.9 1.9  --   PHOS 5.5* 4.0 3.6 4.2 3.8 3.9   GFR: Estimated Creatinine Clearance: 27.8 mL/min (A) (by C-G formula based on SCr of 1.99 mg/dL (H)). Liver Function Tests: Recent Labs  Lab 03/01/19 0338 03/02/19 0402 03/03/19 0424 03/04/19 0415 03/05/19 0447  ALBUMIN 3.8 3.5 3.3* 3.0* 3.1*   No results for input(s): LIPASE, AMYLASE in the last 168 hours. No results for input(s): AMMONIA in the last 168 hours. Coagulation Profile: No results for input(s): INR, PROTIME in the last 168 hours. Cardiac Enzymes: No results for input(s): CKTOTAL, CKMB, CKMBINDEX, TROPONINI in the last 168 hours. BNP (last 3 results) No results for input(s): PROBNP in the last 8760 hours. HbA1C: No results for input(s): HGBA1C in the last 72 hours. CBG: Recent Labs  Lab 03/04/19 0814 03/04/19 1212 03/04/19 1734 03/04/19 2203 03/05/19 0755  GLUCAP 235* 113* 255* 183* 85   Lipid Profile: No results for input(s): CHOL, HDL, LDLCALC, TRIG, CHOLHDL, LDLDIRECT in the last 72 hours. Thyroid Function Tests: No results for input(s): TSH, T4TOTAL, FREET4, T3FREE, THYROIDAB in the last 72 hours. Anemia Panel: No results for input(s): VITAMINB12, FOLATE, FERRITIN, TIBC, IRON, RETICCTPCT in the last 72 hours. Sepsis Labs: Recent Labs  Lab 03/03/19 0424  PROCALCITON 0.20    Recent Results (from the past 240 hour(s))  Respiratory Panel by PCR     Status: None   Collection Time: 02/24/19 11:50 AM  Result Value Ref Range Status   Adenovirus NOT DETECTED NOT DETECTED Final   Coronavirus 229E NOT DETECTED NOT DETECTED Final    Comment: (NOTE) The Coronavirus on the Respiratory Panel, DOES NOT test for the novel  Coronavirus (2019 nCoV)    Coronavirus HKU1 NOT DETECTED NOT DETECTED Final   Coronavirus NL63 NOT DETECTED NOT DETECTED Final   Coronavirus OC43 NOT DETECTED NOT DETECTED Final   Metapneumovirus NOT DETECTED NOT DETECTED  Final   Rhinovirus / Enterovirus NOT DETECTED NOT DETECTED Final   Influenza A NOT DETECTED NOT DETECTED Final   Influenza B NOT DETECTED NOT DETECTED Final   Parainfluenza Virus 1 NOT DETECTED NOT DETECTED Final   Parainfluenza Virus 2 NOT DETECTED NOT DETECTED Final   Parainfluenza Virus 3 NOT DETECTED NOT DETECTED Final   Parainfluenza Virus 4 NOT DETECTED NOT DETECTED Final   Respiratory Syncytial Virus NOT DETECTED NOT DETECTED Final   Bordetella pertussis NOT DETECTED NOT DETECTED Final   Chlamydophila pneumoniae NOT DETECTED NOT DETECTED Final   Mycoplasma pneumoniae NOT DETECTED NOT DETECTED Final    Comment: Performed at [facility], [address] [place], [place] [address]  MRSA PCR Screening     Status: Abnormal   Collection Time: 02/24/19  6:35 PM  Result Value Ref Range Status   MRSA by PCR POSITIVE (A) NEGATIVE Final    Comment:        The GeneXpert MRSA Assay (FDA approved for NASAL specimens only), is one component of a comprehensive MRSA colonization surveillance program. It is not intended to diagnose MRSA infection nor to guide or monitor treatment for MRSA infections. RESULT CALLED TO, READ BACK BY AND VERIFIED WITH: Marguerita Beards RN 2205 02/24/19 A BROWNING Performed at [facility], [place] [address] [place], [place] [address]          Radiology Studies: No results found.      Scheduled Meds: . aspirin EC  81 mg Oral Daily  . atorvastatin  20 mg Oral QPC supper  . brimonidine  1 drop Both Eyes BID  . darbepoetin (ARANESP) injection - NON-DIALYSIS  150 mcg Subcutaneous Q Mon-1800  . dorzolamide-timolol  1 drop Both Eyes BID  . enoxaparin (LOVENOX) injection  30 mg Subcutaneous Q24H  . feeding supplement  1 Container Oral TID BM  . ferrous sulfate  325 mg Oral QPC supper  . furosemide  80 mg Oral BID  . glimepiride  2 mg Oral Q breakfast  . insulin aspart  0-20 Units Subcutaneous TID WC  . insulin aspart  0-5 Units Subcutaneous QHS  .  insulin aspart  4 Units Subcutaneous TID WC  . ipratropium-albuterol  3 mL Nebulization TID  . lamoTRIgine  100 mg Oral QPM  . latanoprost  1 drop Both Eyes QHS  . macitentan  10 mg Oral Daily  . magnesium oxide  200 mg Oral QODAY  . mouth rinse  15 mL Mouth Rinse BID  . mometasone-formoterol  2 puff Inhalation BID  . polyethylene glycol  17 g Oral Daily  . predniSONE  40 mg Oral Q breakfast  . sildenafil  20 mg Oral TID  . sodium bicarbonate  1,300 mg Oral TID  . sodium chloride flush  3 mL Intravenous Q12H   Continuous Infusions: . sodium chloride    . ferric gluconate (FERRLECIT/NULECIT) IV 125 mg (03/04/19 1052)  LOS: 9 days   Time spent=  30 mins    Ankit Arsenio Loader, MD [facility]  If 7PM-7AM, please contact night-coverage [email or web address] 03/05/2019, 11:13 AM

## 2019-03-06 ENCOUNTER — Ambulatory Visit: Payer: Medicare Other | Admitting: Cardiology

## 2019-03-06 LAB — MAGNESIUM: Magnesium: 2.2 mg/dL (ref 1.7–2.4)

## 2019-03-06 LAB — RENAL FUNCTION PANEL
ANION GAP: 10 (ref 5–15)
Albumin: 3.3 g/dL — ABNORMAL LOW (ref 3.5–5.0)
BUN: 40 mg/dL — ABNORMAL HIGH (ref 8–23)
CHLORIDE: 95 mmol/L — AB (ref 98–111)
CO2: 31 mmol/L (ref 22–32)
Calcium: 9.6 mg/dL (ref 8.9–10.3)
Creatinine, Ser: 2.44 mg/dL — ABNORMAL HIGH (ref 0.44–1.00)
GFR calc Af Amer: 23 mL/min — ABNORMAL LOW (ref >60)
GFR calc non Af Amer: 20 mL/min — ABNORMAL LOW (ref >60)
Glucose, Bld: 95 mg/dL (ref 70–99)
POTASSIUM: 3.9 mmol/L (ref 3.5–5.1)
Phosphorus: 4 mg/dL (ref 2.5–4.6)
Sodium: 136 mmol/L (ref 135–145)

## 2019-03-06 LAB — GLUCOSE, CAPILLARY
Glucose-Capillary: 120 mg/dL — ABNORMAL HIGH (ref 70–99)
Glucose-Capillary: 200 mg/dL — ABNORMAL HIGH (ref 70–99)
Glucose-Capillary: 243 mg/dL — ABNORMAL HIGH (ref 70–99)
Glucose-Capillary: 126 mg/dL — ABNORMAL HIGH (ref 70–99)

## 2019-03-06 MED ORDER — FUROSEMIDE 80 MG PO TABS
80.0000 mg | ORAL_TABLET | Freq: Every day | ORAL | Status: DC
  Administered 2019-03-06 – 2019-03-09 (×4): 80 mg via ORAL
  Filled 2019-03-06 (×4): qty 1

## 2019-03-06 NOTE — TOC Initial Note (Addendum)
Transition of Care [facility]) - Initial/Assessment Note    Patient Details  Name: Elizabeth Thompson MRN: [id number] Date of Birth: [birth year]  [facility] CM/SW Contact:    Sharin Mons, RN Phone Number: 03/06/2019, 3:05 PM  Clinical Narrative:        Presented with worsening SOB, pulmonary HTN/ COPD with chronic bronchits. From home with niece, [name]. Oxygen dependent/ [employer].        [name] (Niece)     [phone]      PCP: Reita Cliche  Expected Discharge Plan: Home w Hospice Care Barriers to Discharge: Continued Medical Work up(weaning high flow oxygen)   Patient Goals and CMS Choice Patient states their goals for this hospitalization and ongoing recovery are:: to go home  with my neice with hospice care CMS Medicare.gov Compare Post Acute Care list provided to:: Patient Choice offered to / list presented to : Patient  Expected Discharge Plan and Services Expected Discharge Plan: [place]   Discharge Planning Services: CM Consult   Living arrangements for the past 2 months: Single Family Home Expected Discharge Date: 02/27/19                     [place], referral made after choice given to pt and pt selected [facility]. Acceptance pending....  Prior Living Arrangements/Services Living arrangements for the past 2 months: Single Family Home Lives with:: Relatives(resides with niece [name]) Patient language and need for interpreter reviewed:: Yes Do you feel safe going back to the place where you live?: Yes      Need for Family Participation in Patient Care: Yes (Comment) Care giver support system in place?: Yes (comment)   Criminal Activity/Legal Involvement Pertinent to Current Situation/Hospitalization: No - Comment as needed  Activities of Daily Living Home Assistive Devices/Equipment: None, Oxygen, Blood pressure cuff, CBG Meter ADL Screening (condition at time of admission) Patient's  cognitive ability adequate to safely complete daily activities?: Yes Is the patient deaf or have difficulty hearing?: No Does the patient have difficulty seeing, even when wearing glasses/contacts?: No Does the patient have difficulty concentrating, remembering, or making decisions?: No Patient able to express need for assistance with ADLs?: Yes Does the patient have difficulty dressing or bathing?: No(can so it but experience SOB) Independently performs ADLs?: Yes (appropriate for developmental age) Does the patient have difficulty walking or climbing stairs?: Yes Weakness of Legs: None Weakness of Arms/Hands: None  Permission Sought/Granted Permission sought to share information with : Case Manager Permission granted to share information with : Yes, Verbal Permission Granted  Share Information with NAME: [name] (Niece)           Emotional Assessment Appearance:: Appears older than stated age Attitude/Demeanor/Rapport: Engaged Affect (typically observed): Accepting Orientation: : Oriented to Self, Oriented to  Time, Oriented to Place, Oriented to Situation Alcohol / Substance Use: Not Applicable Psych Involvement: No (comment)(hx of bipolar)  Admission diagnosis:  COPD with chronic bronchitis ([place]) [J44.9] COPD exacerbation ([place]) [J44.1] Patient Active Problem List   Diagnosis Date Noted  . Cor pulmonale, chronic ([place])   . Acute on chronic right-sided heart failure ([place])   . Hyponatremia with excess extracellular fluid volume   . Goals of care, counseling/discussion   . Palliative care by specialist   . OSA (obstructive sleep apnea)   . AKI (acute kidney injury) ([place]) 01/03/2019  . Acute respiratory failure ([place]) 01/03/2019  . Hypertension 07/02/2017  . Chronic  respiratory failure with hypoxia, on home O2 therapy ([place]) 07/02/2017  . COPD (chronic obstructive pulmonary disease) ([place]) 07/02/2017  . Diabetes mellitus without complication ([place]) 50/51/0712  . Pulmonary  hypertension ([place]) 07/02/2017  . Bipolar disorder ([place]) 07/02/2017  . Hyperlipidemia 07/02/2017  . Acute hyponatremia 07/02/2017  . Acute metabolic encephalopathy 52/47/9980  . CKD (chronic kidney disease) stage 3, GFR 30-59 ml/min (HCC) 07/02/2017  . Elevated troponin   . Cor pulmonale, acute ([place])   . Acute on chronic respiratory failure ([place]) 06/09/2017  . Chronic renal disease, stage III ([place]) 05/05/2017  . Chronic respiratory failure with hypoxia ([place]) 05/05/2017  . Renal cell carcinoma, left ([place]) 02/05/2017  . Dyspnea 12/28/2016  . Depression 09/04/2016  . Renal malignant tumor ([place]) 06/26/2016  . GERD (gastroesophageal reflux disease) 03/22/2016  . Insomnia 03/22/2016  . COPD GOLD 0  12/06/2015  . Essential hypertension 12/06/2015  . COPD exacerbation ([place]) 11/20/2015  . Multiple lung nodules 11/20/2015  . Morbid obesity due to excess calories ([place]) 11/20/2015  . Hypersomnia with sleep apnea 11/20/2015  . Diabetes mellitus type 2, controlled, without complications ([place]) 01/05/9358  . HLD (hyperlipidemia) 11/06/2015  . Chronic low back pain 05/09/2015  . Gait disorder 05/09/2015  . Memory disorder 09/05/2014  . Schizoaffective disorder ([place]) 08/18/2014   PCP:  Everardo Beals, NP Pharmacy:   [facility] [place], [place] [address] Phone: [phone] Fax: [phone]     Social Determinants of Health (SDOH) Interventions    Readmission Risk Interventions No flowsheet data found.

## 2019-03-06 NOTE — Progress Notes (Signed)
Physical Therapy Treatment Patient Details Name: Elizabeth Thompson MRN: [id number] DOB: 17-Sep-1951 Today's Date: 03/06/2019    History of Present Illness  Elizabeth Thompson is a 68 y.o. female with medical history significant of bipolar disorder, pulmonary hypertension with COPD on 6 L nasal cannula at baseline, diabetes type 2 who presented with worsening shortness of breath and a nonproductive cough.  Work up includes acute on chronic hypoxic respiratory failure in the setting of decompensated pulmonary HTN, cor pulmonale and diastolic dysfunction with pulmonary edema.    PT Comments    Pt performed gait training with close monitor on SPO2, pt drops quickly as she approaches end of gait trials but appears asymptomatic with mild DOE.  Instructed in pursed lip breathing during session.  Pt continues to progress but remains limited due to SPO2.  Plan for SNF placement remains appropriate.     Follow Up Recommendations  SNF;Supervision/Assistance - 24 hour     Equipment Recommendations  None recommended by PT    Recommendations for Other Services       Precautions / Restrictions Precautions Precautions: Fall;Other (comment) Precaution Comments: watch SpO2/HR  Restrictions Weight Bearing Restrictions: No    Mobility  Bed Mobility Overal bed mobility: Modified Independent             General bed mobility comments: extra time and effort.  Transfers Overall transfer level: Needs assistance Equipment used: Rolling walker (2 wheeled) Transfers: Sit to/from Stand Sit to Stand: Supervision         General transfer comment: Cues for hand placement to and from seated surface.  Pt sitting before reaching back for chair.  Performed multiple trials for carryover.    Ambulation/Gait Ambulation/Gait assistance: Min guard Gait Distance (Feet): 30 Feet(x2  + 3 trial of 40 ft.  ) Assistive device: Rolling walker (2 wheeled) Gait Pattern/deviations: Step-to pattern;Decreased step  length - right;Decreased step length - left;Trunk flexed     General Gait Details: O2 sats decreased to 75 % and ends of trials, however SPO2 difficult to obtain reading.  Cues for pacing pursed lip breathing and increasing stride length.     Stairs             Wheelchair Mobility    Modified Rankin (Stroke Patients Only)       Balance Overall balance assessment: Needs assistance Sitting-balance support: No upper extremity supported;Feet supported Sitting balance-Leahy Scale: Fair       Standing balance-Leahy Scale: Fair                              Cognition Arousal/Alertness: Awake/alert Behavior During Therapy: WFL for tasks assessed/performed Overall Cognitive Status: Within Functional Limits for tasks assessed                                 General Comments: flat affect, pleasant, minimal verbalizations      Exercises      General Comments        Pertinent Vitals/Pain Pain Assessment: No/denies pain    Home Living                      Prior Function            PT Goals (current goals can now be found in the care plan section) Acute Rehab PT Goals Patient Stated Goal: to get to her  chair Potential to Achieve Goals: Good Progress towards PT goals: Progressing toward goals    Frequency    Min 3X/week      PT Plan Current plan remains appropriate    Co-evaluation              AM-PAC PT "6 Clicks" Mobility   Outcome Measure  Help needed turning from your back to your side while in a flat bed without using bedrails?: None Help needed moving from lying on your back to sitting on the side of a flat bed without using bedrails?: None Help needed moving to and from a bed to a chair (including a wheelchair)?: A Little Help needed standing up from a chair using your arms (e.g., wheelchair or bedside chair)?: A Little Help needed to walk in hospital room?: A Lot Help needed climbing 3-5 steps with a  railing? : A Lot 6 Click Score: 18    End of Session Equipment Utilized During Treatment: Gait belt Activity Tolerance: Patient tolerated treatment well;Patient limited by fatigue Patient left: in chair;with call bell/phone within reach;with family/visitor present(niece present) Nurse Communication: Mobility status PT Visit Diagnosis: Other abnormalities of gait and mobility (R26.89);Difficulty in walking, not elsewhere classified (R26.2)     Time: 4709-2957 PT Time Calculation (min) (ACUTE ONLY): 17 min  Charges:  $Gait Training: 8-22 mins                     Governor Rooks, PTA Acute Rehabilitation Services Pager [phone] Office [phone]     Elizabeth Thompson 03/06/2019, 2:24 PM

## 2019-03-06 NOTE — Progress Notes (Signed)
Palliative:  I met again today at [place]'s bedside along with herself and niece, [name]. We discussed plan moving forward with proceeding with home hospice care and they agree. We discussed the benefits with hospice and although they are hopeful for slow continued improvement they are aware that she can decline again very easily. Discussed plan to proceed with obtaining equipment with plan to return home. [name] says that she needs to return to their home to clean and prepare and will not be ready until Wednesday.   All questions/concerns addressed. Emotional support provided.   Exam: Alert, oriented.Sitting in recliner. More awake and alert than my past visits. No distress. Working with PT.   Plan: - Return home with hospice (secondary to respiratory decline with pulmonary hypertension and cor pulmonale/CHF) under the care of her niece, [name].   35 min  Vinie Sill, NP Palliative Medicine Team Pager # [phone] (M-F 8a-5p) Team Phone # [phone] (Nights/Weekends)

## 2019-03-06 NOTE — Telephone Encounter (Signed)
[name] stated Patient was admitted to [facility], 02/24/19, for COPD, and is currently on [place] stated that she has received a unit of blood since being admitted.  [name] stated Patient is doing better.  [name] stated they are beginning to wean her oxygen.  [name] stated she would keep Dr Melvyn Novas updated.  Message routed to Dr Melvyn Novas, as Juluis Rainier Nothing further needed, closing message in triage

## 2019-03-06 NOTE — Progress Notes (Signed)
PROGRESS NOTE    Elizabeth Thompson  TGG:[id number] DOB: [birth year] DOA: 02/24/2019 PCP: Everardo Beals, NP   Brief Narrative:  68 year old with history of bipolar disorder, pulmonary hypertension, COPD on 6 L nasal cannula, diabetes mellitus type 2 came to the hospital with worsening of shortness of breath and nonproductive cough with hypoxia.  She started receiving IV Solu-Medrol and bronchodilators.  She was initially on BiPAP also subsequently thought there was concerns of fluid overload therefore started on IV diuretics.  Pulmonary, nephrology, cardiology and palliative care teams were consulted.  With aggressive diuresis patient sodium level is improved.   Assessment & Plan:   Principal Problem:   Pulmonary hypertension (HCC) Active Problems:   Diabetes mellitus type 2, controlled, without complications (HCC)   COPD exacerbation (HCC)   COPD GOLD 0    Acute on chronic respiratory failure (HCC)   Goals of care, counseling/discussion   Cor pulmonale, chronic (HCC)   Acute on chronic right-sided heart failure (HCC)   Hyponatremia with excess extracellular fluid volume   Acute hypoxic respiratory failure requiring 10 L nasal cannula/nonrebreather Multifactorial- acute COPD exacerbation Group 3 pulmonary arterial hypertension -Cont Oral Prednisone.  Aggressive bronchodilator treatments. Dulera.  Incentive spirometry and flutter valve. CM informed to help arrange for higher Oxygen concentrator at home.  Will order Oxymizer as well. - Supplemental oxygen as needed.  Continue sildenafil and OPSUMIT. -On Augmentin, Procal <0.2. Off Abx now. -Due to rising creatinine, will reduce Lasix dose to 80 mg daily.  Hyponatremia, resolved -Cortisol stim test-normal.  Significantly improved with diuresis.  Acute on chronic renal failure, stage III, stable - Creatinine trended up to 2.4 from 1.9.  Acute on chronic diastolic congestive heart failure, class IV - Echocardiogram January  2020-normal ejection fraction grade 1 diastolic dysfunction.  Cardiology following.  Patient likely getting more on the drier side, change Lasix to 80 mg daily.  Anemia of chronic disease, stage IV.  Baseline creatinine 1.8 - Iron deficiency noted.  Iron supplements  And Epo.  We can consider transfusing her another unit of blood to keep hemoglobin above 8.0.   Essential hypertension -In acceptable range.   Hyperlipidemia -Continue Lipitor 20 mg daily  Diabetes mellitus type 2 -Accu-Cheks and sliding scale.  Continue glipizide.  Metformin is on hold.  Bipolar disorder/dementia -Abilify and Aricept on hold due to hyponatremia  PT - SNF  DVT prophylaxis: SCDs Code Status: DNR Family Communication: Nieces at the bedside Disposition Plan: Very slow to improve. Considering going home with hospice.   Consultants:   Pulmonary  Nephrology  Palliative care   Subjective: Feels slightly better but still on 10L [place]  Review of Systems Otherwise negative except as per HPI, including: General = no fevers, chills, dizziness, malaise, fatigue HEENT/EYES = negative for pain, redness, loss of vision, double vision, blurred vision, loss of hearing, sore throat, hoarseness, dysphagia Cardiovascular= negative for chest pain, palpitation, murmurs, lower extremity swelling Respiratory/lungs= negative for  hemoptysis, wheezing, mucus production Gastrointestinal= negative for nausea, vomiting,, abdominal pain, melena, hematemesis Genitourinary= negative for Dysuria, Hematuria, Change in Urinary Frequency MSK = Negative for arthralgia, myalgias, Back Pain, Joint swelling  Neurology= Negative for headache, seizures, numbness, tingling  Psychiatry= Negative for anxiety, depression, suicidal and homocidal ideation Allergy/Immunology= Medication/Food allergy as listed  Skin= Negative for Rash, lesions, ulcers, itching   Objective: Vitals:   03/05/19 2107 03/06/19 0340 03/06/19 0612 03/06/19 0826   BP: (!) 79/55  102/60   Pulse:      Resp:  Temp: 98.7 F (37.1 C) 97.9 F (36.6 C) 98 F (36.7 C)   TempSrc: Oral Axillary Axillary   SpO2:    94%  Weight:   87.1 kg   Height:        Intake/Output Summary (Last 24 hours) at 03/06/2019 1040 Last data filed at 03/06/2019 0900 Gross per 24 hour  Intake 240 ml  Output 2600 ml  Net -2360 ml   Filed Weights   03/02/19 0500 03/03/19 0500 03/06/19 0612  Weight: 84.2 kg 84.2 kg 87.1 kg    Examination:  Constitutional: NAD, calm, comfortable; on 10L [place] Eyes: PERRL, lids and conjunctivae normal ENMT: Mucous membranes are moist. Posterior pharynx clear of any exudate or lesions.Normal dentition.  Neck: normal, supple, no masses, no thyromegaly Respiratory: minimal bibasilar course BS Cardiovascular: Regular rate and rhythm, no murmurs / rubs / gallops. No extremity edema. 2+ pedal pulses. No carotid bruits.  Abdomen: no tenderness, no masses palpated. No hepatosplenomegaly. Bowel sounds positive.  Musculoskeletal: no clubbing / cyanosis. No joint deformity upper and lower extremities. Good ROM, no contractures. Normal muscle tone.  Skin: no rashes, lesions, ulcers. No induration Neurologic: CN 2-12 grossly intact. Sensation intact, DTR normal. Strength 4/5 in all 4.  Psychiatric: Normal judgment and insight. Alert and oriented x 3. Normal mood.    Data Reviewed:   CBC: Recent Labs  Lab 02/28/19 0559 03/01/19 0338 03/02/19 0402 03/03/19 0424 03/04/19 0415  WBC 6.8 15.6* 14.1* 11.8* 11.6*  HGB 7.6* 9.4* 8.6* 8.6* 8.9*  HCT 24.6* 31.8* 29.7* 29.8* 30.2*  MCV 79.6* 81.3 83.7 85.9 88.0  PLT 498* 595* 532* 483* 585   Basic Metabolic Panel: Recent Labs  Lab 03/01/19 0338 03/02/19 0402 03/03/19 0424 03/04/19 0415 03/05/19 0447 03/06/19 0525  NA 137 138 138 136 137 136  K 4.0 3.7 3.7 3.0* 3.7 3.9  CL 90* 93* 91* 91* 93* 95*  CO2 32 35* 35* 35* 32 31  GLUCOSE 176* 116* 112* 107* 117* 95  BUN 49* 41* 40* 37* 37* 40*   CREATININE 2.22* 2.04* 2.12*  2.24* 2.10* 1.99* 2.44*  CALCIUM 10.0 9.6 9.7 9.4 9.6 9.6  MG 2.3 2.1 1.9 1.9  --  2.2  PHOS 4.0 3.6 4.2 3.8 3.9 4.0   GFR: Estimated Creatinine Clearance: 23.1 mL/min (A) (by C-G formula based on SCr of 2.44 mg/dL (H)). Liver Function Tests: Recent Labs  Lab 03/02/19 0402 03/03/19 0424 03/04/19 0415 03/05/19 0447 03/06/19 0525  ALBUMIN 3.5 3.3* 3.0* 3.1* 3.3*   No results for input(s): LIPASE, AMYLASE in the last 168 hours. No results for input(s): AMMONIA in the last 168 hours. Coagulation Profile: No results for input(s): INR, PROTIME in the last 168 hours. Cardiac Enzymes: No results for input(s): CKTOTAL, CKMB, CKMBINDEX, TROPONINI in the last 168 hours. BNP (last 3 results) No results for input(s): PROBNP in the last 8760 hours. HbA1C: No results for input(s): HGBA1C in the last 72 hours. CBG: Recent Labs  Lab 03/05/19 0755 03/05/19 1153 03/05/19 1711 03/05/19 2033 03/06/19 0835  GLUCAP 85 168* 306* 201* 120*   Lipid Profile: No results for input(s): CHOL, HDL, LDLCALC, TRIG, CHOLHDL, LDLDIRECT in the last 72 hours. Thyroid Function Tests: No results for input(s): TSH, T4TOTAL, FREET4, T3FREE, THYROIDAB in the last 72 hours. Anemia Panel: No results for input(s): VITAMINB12, FOLATE, FERRITIN, TIBC, IRON, RETICCTPCT in the last 72 hours. Sepsis Labs: Recent Labs  Lab 03/03/19 0424  PROCALCITON 0.20    Recent Results (from the past  240 hour(s))  Respiratory Panel by PCR     Status: None   Collection Time: 02/24/19 11:50 AM  Result Value Ref Range Status   Adenovirus NOT DETECTED NOT DETECTED Final   Coronavirus 229E NOT DETECTED NOT DETECTED Final    Comment: (NOTE) The Coronavirus on the Respiratory Panel, DOES NOT test for the novel  Coronavirus (2019 nCoV)    Coronavirus HKU1 NOT DETECTED NOT DETECTED Final   Coronavirus NL63 NOT DETECTED NOT DETECTED Final   Coronavirus OC43 NOT DETECTED NOT DETECTED Final    Metapneumovirus NOT DETECTED NOT DETECTED Final   Rhinovirus / Enterovirus NOT DETECTED NOT DETECTED Final   Influenza A NOT DETECTED NOT DETECTED Final   Influenza B NOT DETECTED NOT DETECTED Final   Parainfluenza Virus 1 NOT DETECTED NOT DETECTED Final   Parainfluenza Virus 2 NOT DETECTED NOT DETECTED Final   Parainfluenza Virus 3 NOT DETECTED NOT DETECTED Final   Parainfluenza Virus 4 NOT DETECTED NOT DETECTED Final   Respiratory Syncytial Virus NOT DETECTED NOT DETECTED Final   Bordetella pertussis NOT DETECTED NOT DETECTED Final   Chlamydophila pneumoniae NOT DETECTED NOT DETECTED Final   Mycoplasma pneumoniae NOT DETECTED NOT DETECTED Final    Comment: Performed at [facility], [address], [place], [place] [address]  MRSA PCR Screening     Status: Abnormal   Collection Time: 02/24/19  6:35 PM  Result Value Ref Range Status   MRSA by PCR POSITIVE (A) NEGATIVE Final    Comment:        The GeneXpert MRSA Assay (FDA approved for NASAL specimens only), is one component of a comprehensive MRSA colonization surveillance program. It is not intended to diagnose MRSA infection nor to guide or monitor treatment for MRSA infections. RESULT CALLED TO, READ BACK BY AND VERIFIED WITH: Marguerita Beards RN 2205 02/24/19 A BROWNING Performed at [facility], [place] [address] [place], [place] [address]          Radiology Studies: No results found.      Scheduled Meds: . aspirin EC  81 mg Oral Daily  . atorvastatin  20 mg Oral QPC supper  . brimonidine  1 drop Both Eyes BID  . darbepoetin (ARANESP) injection - NON-DIALYSIS  150 mcg Subcutaneous Q Mon-1800  . dorzolamide-timolol  1 drop Both Eyes BID  . enoxaparin (LOVENOX) injection  30 mg Subcutaneous Q24H  . feeding supplement  1 Container Oral TID BM  . ferrous sulfate  325 mg Oral QPC supper  . furosemide  80 mg Oral Daily  . glimepiride  2 mg Oral Q breakfast  . insulin aspart  0-20 Units Subcutaneous TID WC  .  insulin aspart  0-5 Units Subcutaneous QHS  . insulin aspart  4 Units Subcutaneous TID WC  . ipratropium-albuterol  3 mL Nebulization TID  . lamoTRIgine  100 mg Oral QPM  . latanoprost  1 drop Both Eyes QHS  . macitentan  10 mg Oral Daily  . magnesium oxide  200 mg Oral QODAY  . mouth rinse  15 mL Mouth Rinse BID  . mometasone-formoterol  2 puff Inhalation BID  . polyethylene glycol  17 g Oral Daily  . predniSONE  40 mg Oral Q breakfast  . sildenafil  20 mg Oral TID  . sodium bicarbonate  1,300 mg Oral TID  . sodium chloride flush  3 mL Intravenous Q12H   Continuous Infusions: . sodium chloride       LOS: 10 days  Time spent=  25 mins    Ankit Arsenio Loader, MD [facility]  If 7PM-7AM, please contact night-coverage [email or web address] 03/06/2019, 10:40 AM

## 2019-03-06 NOTE — Progress Notes (Addendum)
Manufacturing engineer (ACC)  Notified by Levada Dy, RN Case Manager, of family request for [facility] services at home after discharge.  Chart and patient information under review by [facility],The physician, and eligibility is pending at this time.    Spoke with niece Olin Hauser to initiate education related to hospice philosophy, services and team approach to care. Olin Hauser verbalized understanding of information given.  Plan is to discharge to  [facility] [place], [place] [address].  Per niece Olin Hauser, she will not have her home ready until Wednesday to provide care for her.  She will need ambulance transportation home.      Please send signed and completed DNR form home with family.  Patient will need prescriptions for discharge comfort medications.  DME needs discussed, patient currently has the following equipment: bed, O2, wheelchair, BSC, rollator Patient will need the following equipment:  O2 (10lpm), walker with front wheels  [facility] is aware of the above.  Completed discharge summary will need to be faxed to [facility] at [phone].  Please notify ACC when patient is ready to leave the unit at discharge [phone] between 830-5, [phone] after 5).    ACC information give to niece Olin Hauser   Please call with any hospice related questions/concerns  Venia Carbon RN, BSN, Teacher, music [facility]) [facility] [phone]  **update 530p-pt eligible for hospice services at home once discharged

## 2019-03-07 DIAGNOSIS — Z7189 Other specified counseling: Secondary | ICD-10-CM

## 2019-03-07 LAB — GLUCOSE, CAPILLARY
Glucose-Capillary: 128 mg/dL — ABNORMAL HIGH (ref 70–99)
Glucose-Capillary: 140 mg/dL — ABNORMAL HIGH (ref 70–99)
Glucose-Capillary: 192 mg/dL — ABNORMAL HIGH (ref 70–99)
Glucose-Capillary: 265 mg/dL — ABNORMAL HIGH (ref 70–99)

## 2019-03-07 LAB — RENAL FUNCTION PANEL
Albumin: 3.2 g/dL — ABNORMAL LOW (ref 3.5–5.0)
Anion gap: 14 (ref 5–15)
BUN: 41 mg/dL — AB (ref 8–23)
CO2: 29 mmol/L (ref 22–32)
CREATININE: 2.33 mg/dL — AB (ref 0.44–1.00)
Calcium: 9.5 mg/dL (ref 8.9–10.3)
Chloride: 91 mmol/L — ABNORMAL LOW (ref 98–111)
GFR calc non Af Amer: 21 mL/min — ABNORMAL LOW (ref >60)
GFR, EST AFRICAN AMERICAN: 24 mL/min — AB (ref >60)
Glucose, Bld: 305 mg/dL — ABNORMAL HIGH (ref 70–99)
Phosphorus: 4.2 mg/dL (ref 2.5–4.6)
Potassium: 4.1 mmol/L (ref 3.5–5.1)
Sodium: 134 mmol/L — ABNORMAL LOW (ref 135–145)

## 2019-03-07 LAB — MAGNESIUM: Magnesium: 2.1 mg/dL (ref 1.7–2.4)

## 2019-03-07 MED ORDER — SODIUM CHLORIDE 0.9 % IV BOLUS
250.0000 mL | Freq: Once | INTRAVENOUS | Status: AC
  Administered 2019-03-07: 250 mL via INTRAVENOUS

## 2019-03-07 NOTE — Progress Notes (Signed)
Palliative:  I met today with Elizabeth Thompson and Elizabeth Thompson at bedside. Elizabeth Thompson is much more alert (although she does sleep during most of my visit) but arouses much easier and conversing much more than previously. She is motivated to move and be out of bed. Oxygen needs have stayed at 8-9L over past ~24 hours which is improved. They are pleased with her progress. Elizabeth Thompson is working to clean and get home ready for [name]'s return. She is awaiting a time for equipment delivery and plans on cleaning to rid home of dust and dirt that could impact [name]'s breathing. They are working with hospice to return home over the next few days. Emotional support provided.   Exam: Awake, alert, oriented. No distress.   Plan: - Home with hospice when equipment and home ready  25 min  Vinie Sill, NP Palliative Medicine Team Pager # [phone] (M-F 8a-5p) Team Phone # [phone] (Nights/Weekends)

## 2019-03-07 NOTE — Progress Notes (Signed)
Subjective:   Dyspnea much improved. Reviewed the chart and opinions from other specialties. I also met with her niece.   Intake/Output from previous day:  I/O last 3 completed shifts: In: 72 [P.O.:480] Out: 4001 [Urine:4000; Stool:1] Total I/O In: 720 [P.O.:720] Out: 800 [Urine:800]  Blood pressure (!) 121/53, pulse 87, temperature 98.4 F (36.9 C), temperature source Oral, resp. rate 17, height _0  (1.6 m), weight 88.9 kg, SpO2 94 %. Physical Exam  Constitutional: She appears distressed (mild and chronically ill looking).  Eyes: Conjunctivae are normal.  Neck: JVD present.  Cardiovascular: Exam reveals distant heart sounds.  No murmur heard. Pulmonary/Chest: She is in respiratory distress (mild). She has wheezes (mild and diffuse).  Abdominal: Soft.  Musculoskeletal:        General: No edema.    Lab Results: BMP BNP (last 3 results) Recent Labs    01/25/19 0323 02/24/19 1150 02/26/19 0632  BNP 28.1 371.7* 547.8*    ProBNP (last 3 results) No results for input(s): PROBNP in the last 8760 hours. BMP Latest Ref Rng & Units 03/07/2019 03/06/2019 03/05/2019  Glucose 70 - 99 mg/dL 305(H) 95 117(H)  BUN 8 - 23 mg/dL 41(H) 40(H) 37(H)  Creatinine 0.44 - 1.00 mg/dL 2.33(H) 2.44(H) 1.99(H)  BUN/Creat Ratio 11 - 26 - - -  Sodium 135 - 145 mmol/L 134(L) 136 137  Potassium 3.5 - 5.1 mmol/L 4.1 3.9 3.7  Chloride 98 - 111 mmol/L 91(L) 95(L) 93(L)  CO2 22 - 32 mmol/L 29 31 32  Calcium 8.9 - 10.3 mg/dL 9.5 9.6 9.6   Hepatic Function Latest Ref Rng & Units 03/07/2019 03/06/2019 03/05/2019  Total Protein 6.5 - 8.1 g/dL - - -  Albumin 3.5 - 5.0 g/dL 3.2(L) 3.3(L) 3.1(L)  AST 15 - 41 U/L - - -  ALT 0 - 44 U/L - - -  Alk Phosphatase 38 - 126 U/L - - -  Total Bilirubin 0.3 - 1.2 mg/dL - - -   CBC Latest Ref Rng & Units 03/04/2019 03/03/2019 03/02/2019  WBC 4.0 - 10.5 K/uL 11.6(H) 11.8(H) 14.1(H)  Hemoglobin 12.0 - 15.0 g/dL 8.9(L) 8.6(L) 8.6(L)  Hematocrit 36.0 - 46.0 % 30.2(L)  29.8(L) 29.7(L)  Platelets 150 - 400 K/uL 386 483(H) 532(H)   Lipid Panel     Component Value Date/Time   CHOL 166 06/13/2017 0728   TRIG 72 06/13/2017 0728   HDL 61 06/13/2017 0728   CHOLHDL 2.7 06/13/2017 0728   VLDL 14 06/13/2017 0728   LDLCALC 91 06/13/2017 0728   LDLDIRECT 142.0 03/19/2016 1129   Cardiac Panel (last 3 results) No results for input(s): CKTOTAL, CKMB, TROPONINI, RELINDX in the last 72 hours.  HEMOGLOBIN A1C Lab Results  Component Value Date   HGBA1C 6.5 (H) 02/24/2019   MPG 139.85 02/24/2019   TSH Recent Labs    02/26/19 1453  TSH 0.308*   Imaging: Imaging results have been reviewed  CARDIAC STUDIES:  EKG 02/24/2019: Normal sinus rhythm at rate of 87 bpm, right axis deviation, poor progression, no evidence of ischemia.  Otherwise normal EKG.  Echocardiogram 01/07/2019: - Left ventricle: The cavity size was normal. Wall thickness was  increased in a pattern of mild LVH. Systolic function was normal.  The estimated ejection fraction was in the range of 55% to 60%.  Wall motion was normal; there were no regional wall motion  abnormalities. Doppler parameters are consistent with abnormal  left ventricular relaxation (grade 1 diastolic dysfunction).  Doppler parameters are consistent with high  ventricular filling  pressure. - Right ventricle: Systolic function was moderately reduced.  ASSESSMENT AND PLAN:  1.  Acute cor pulmonale secondary to underlying severe COPD exacerbated by severe anemia, progression of underlying COPD and probably pulmonary hypertension. 2.  Acute on chronic diastolic heart failure, LV 3.  Acute on chronic systolic heart failure, RV secondary to #1. 4.  Anemia of chronic disease and also related to stage IV chronic kidney disease.  Also iron deficient. 5. WHO Group II and III severe PH  Rec: I have discussed specifically her caregiver and also power of attorney for health her niece, we discussed that continuing Opsumit may not  prove to be of any benefit in view of extremely guarded long-term prognosis.  However I have recommended that we continue with Revatio as it does give immediate relief from dyspnea and improve diastolic function and reduce severe pulmonary hypertension.  In view of stage IV-V chronic kidney disease, severe anemia, severe underlying COPD, grade 2 diastolic dysfunction, her overall long-term prognosis is very grim and I agree with hospice care and palliative care.  All questions answered.  This was a 30-minute encounter.  Questions regarding futility of care, comfort measures, goals of therapy and end-of-life were discussed extensively with her caregiver.  She is agreeable to this approach.  Adrian Prows, M.D. 03/07/2019, 2:43 PM [facility] Pager: [phone] Office: [phone] If no answer: [phone]

## 2019-03-07 NOTE — Progress Notes (Signed)
PROGRESS NOTE    Elizabeth Thompson  XLK:[id number] DOB: 05/10/1951 DOA: 02/24/2019 PCP: Everardo Beals, NP   Brief Narrative:  68 year old with history of bipolar disorder, pulmonary hypertension, COPD on 6 L nasal cannula, diabetes mellitus type 2 came to the hospital with worsening of shortness of breath and nonproductive cough with hypoxia.  She started receiving IV Solu-Medrol and bronchodilators.  She was initially on BiPAP also subsequently thought there was concerns of fluid overload therefore started on IV diuretics.  Pulmonary, nephrology, cardiology and palliative care teams were consulted.  With aggressive diuresis patient sodium level is improved.  Breathing continues to improve very slowly.  Patient and niece agreeable to go home in next 1-2 days once all the home oxygen arrangements are made.   Assessment & Plan:   Principal Problem:   Pulmonary hypertension (HCC) Active Problems:   Diabetes mellitus type 2, controlled, without complications (HCC)   COPD exacerbation (HCC)   COPD GOLD 0    Acute on chronic respiratory failure (HCC)   Goals of care, counseling/discussion   Cor pulmonale, chronic (HCC)   Acute on chronic right-sided heart failure (HCC)   Hyponatremia with excess extracellular fluid volume   Acute hypoxic respiratory failure requiring 10 L nasal cannula/nonrebreather Multifactorial- acute COPD exacerbation Group 3 pulmonary arterial hypertension -Cont Oral Prednisone.  Aggressive bronchodilator treatments. Dulera.  Incentive spirometry and flutter valve. CM informed to help arrange for higher Oxygen concentrator at home.  Oxymizer ordered (DME).  - Supplemental oxygen as needed.  Continue sildenafil and OPSUMIT. -On Augmentin, Procal <0.2. Off Abx now. -Due to rising creatinine, doing ok on lasix 67m po daily.   Hyponatremia, resolved -Cortisol stim test-normal.  Significantly improved with diuresis.  Acute on chronic renal failure, stage III,  stable - Creatinine stable about 2.3  Acute on chronic diastolic congestive heart failure, class IV - Echocardiogram January 2020-normal ejection fraction grade 1 diastolic dysfunction. .  Now on lasix 817mpo daily.   Anemia of chronic disease, stage IV.  Baseline creatinine 1.8 - Iron deficiency noted.  Iron supplements  And Epo.  We can consider transfusing her another unit of blood to keep hemoglobin above 8.0.   Essential hypertension -In acceptable range.   Hyperlipidemia -Continue Lipitor 20 mg daily  Diabetes mellitus type 2 -Accu-Cheks and sliding scale.  Continue glipizide.  Metformin is on hold.  Bipolar disorder/dementia -Abilify and Aricept on hold due to hyponatremia  PT - SNF  DVT prophylaxis: SCDs Code Status: DNR Family Communication: Nieces at the bedside Disposition Plan: Very slow to improve. Will plan on transitioning home over next 1-2 days   Consultants:   Pulmonary  Nephrology  Palliative care   Subjective: Still on 10L [place] but feels better.   Review of Systems Otherwise negative except as per HPI, including: General = no fevers, chills, dizziness, malaise, fatigue HEENT/EYES = negative for pain, redness, loss of vision, double vision, blurred vision, loss of hearing, sore throat, hoarseness, dysphagia Cardiovascular= negative for chest pain, palpitation, murmurs, lower extremity swelling Respiratory/lungs= negative fo hemoptysis, wheezing, mucus production Gastrointestinal= negative for nausea, vomiting,, abdominal pain, melena, hematemesis Genitourinary= negative for Dysuria, Hematuria, Change in Urinary Frequency MSK = Negative for arthralgia, myalgias, Back Pain, Joint swelling  Neurology= Negative for headache, seizures, numbness, tingling  Psychiatry= Negative for anxiety, depression, suicidal and homocidal ideation Allergy/Immunology= Medication/Food allergy as listed  Skin= Negative for Rash, lesions, ulcers, itching  Objective:  Vitals:   03/07/19 0430 03/07/19 0500 03/07/19 0636643/24/20 [id number]  BP:   (!) 121/53   Pulse:   87   Resp:   17   Temp: 98.4 F (36.9 C)   98.4 F (36.9 C)  TempSrc: Oral   Oral  SpO2:   94%   Weight:  88.9 kg    Height:        Intake/Output Summary (Last 24 hours) at 03/07/2019 0919 Last data filed at 03/07/2019 0759 Gross per 24 hour  Intake 240 ml  Output 2651 ml  Net -2411 ml   Filed Weights   03/03/19 0500 03/06/19 0612 03/07/19 0500  Weight: 84.2 kg 87.1 kg 88.9 kg    Examination:  Constitutional: NAD, calm, comfortable; on 10L Eyes: PERRL, lids and conjunctivae normal ENMT: Mucous membranes are moist. Posterior pharynx clear of any exudate or lesions.Normal dentition.  Neck: normal, supple, no masses, no thyromegaly Respiratory: diffuse dmininshed BS Cardiovascular: Regular rate and rhythm, no murmurs / rubs / gallops. No extremity edema. 2+ pedal pulses. No carotid bruits.  Abdomen: no tenderness, no masses palpated. No hepatosplenomegaly. Bowel sounds positive.  Musculoskeletal: no clubbing / cyanosis. No joint deformity upper and lower extremities. Good ROM, no contractures. Normal muscle tone.  Skin: no rashes, lesions, ulcers. No induration Neurologic: CN 2-12 grossly intact. Sensation intact, DTR normal. Strength 4/5 in all 4.  Psychiatric: Normal judgment and insight. Alert and oriented x 3. Normal mood.    Data Reviewed:   CBC: Recent Labs  Lab 03/01/19 0338 03/02/19 0402 03/03/19 0424 03/04/19 0415  WBC 15.6* 14.1* 11.8* 11.6*  HGB 9.4* 8.6* 8.6* 8.9*  HCT 31.8* 29.7* 29.8* 30.2*  MCV 81.3 83.7 85.9 88.0  PLT 595* 532* 483* 373   Basic Metabolic Panel: Recent Labs  Lab 03/02/19 0402 03/03/19 0424 03/04/19 0415 03/05/19 0447 03/06/19 0525 03/07/19 0414  NA 138 138 136 137 136 134*  K 3.7 3.7 3.0* 3.7 3.9 4.1  CL 93* 91* 91* 93* 95* 91*  CO2 35* 35* 35* 32 31 29  GLUCOSE 116* 112* 107* 117* 95 305*  BUN 41* 40* 37* 37* 40* 41*   CREATININE 2.04* 2.12*  2.24* 2.10* 1.99* 2.44* 2.33*  CALCIUM 9.6 9.7 9.4 9.6 9.6 9.5  MG 2.1 1.9 1.9  --  2.2 2.1  PHOS 3.6 4.2 3.8 3.9 4.0 4.2   GFR: Estimated Creatinine Clearance: 24.4 mL/min (A) (by C-G formula based on SCr of 2.33 mg/dL (H)). Liver Function Tests: Recent Labs  Lab 03/03/19 0424 03/04/19 0415 03/05/19 0447 03/06/19 0525 03/07/19 0414  ALBUMIN 3.3* 3.0* 3.1* 3.3* 3.2*   No results for input(s): LIPASE, AMYLASE in the last 168 hours. No results for input(s): AMMONIA in the last 168 hours. Coagulation Profile: No results for input(s): INR, PROTIME in the last 168 hours. Cardiac Enzymes: No results for input(s): CKTOTAL, CKMB, CKMBINDEX, TROPONINI in the last 168 hours. BNP (last 3 results) No results for input(s): PROBNP in the last 8760 hours. HbA1C: No results for input(s): HGBA1C in the last 72 hours. CBG: Recent Labs  Lab 03/06/19 0835 03/06/19 1122 03/06/19 1715 03/06/19 2147 03/07/19 0744  GLUCAP 120* 200* 243* 126* 128*   Lipid Profile: No results for input(s): CHOL, HDL, LDLCALC, TRIG, CHOLHDL, LDLDIRECT in the last 72 hours. Thyroid Function Tests: No results for input(s): TSH, T4TOTAL, FREET4, T3FREE, THYROIDAB in the last 72 hours. Anemia Panel: No results for input(s): VITAMINB12, FOLATE, FERRITIN, TIBC, IRON, RETICCTPCT in the last 72 hours. Sepsis Labs: Recent Labs  Lab 03/03/19 0424  PROCALCITON 0.20  No results found for this or any previous visit (from the past 240 hour(s)).       Radiology Studies: No results found.      Scheduled Meds: . aspirin EC  81 mg Oral Daily  . atorvastatin  20 mg Oral QPC supper  . brimonidine  1 drop Both Eyes BID  . darbepoetin (ARANESP) injection - NON-DIALYSIS  150 mcg Subcutaneous Q Mon-1800  . dorzolamide-timolol  1 drop Both Eyes BID  . enoxaparin (LOVENOX) injection  30 mg Subcutaneous Q24H  . feeding supplement  1 Container Oral TID BM  . ferrous sulfate  325 mg Oral  QPC supper  . furosemide  80 mg Oral Daily  . glimepiride  2 mg Oral Q breakfast  . insulin aspart  0-20 Units Subcutaneous TID WC  . insulin aspart  0-5 Units Subcutaneous QHS  . insulin aspart  4 Units Subcutaneous TID WC  . ipratropium-albuterol  3 mL Nebulization TID  . lamoTRIgine  100 mg Oral QPM  . latanoprost  1 drop Both Eyes QHS  . macitentan  10 mg Oral Daily  . magnesium oxide  200 mg Oral QODAY  . mouth rinse  15 mL Mouth Rinse BID  . mometasone-formoterol  2 puff Inhalation BID  . polyethylene glycol  17 g Oral Daily  . predniSONE  40 mg Oral Q breakfast  . sildenafil  20 mg Oral TID  . sodium bicarbonate  1,300 mg Oral TID  . sodium chloride flush  3 mL Intravenous Q12H   Continuous Infusions: . sodium chloride       LOS: 11 days   Time spent=  25 mins    Maurina Fawaz Arsenio Loader, MD [facility]  If 7PM-7AM, please contact night-coverage [email or web address] 03/07/2019, 9:19 AM

## 2019-03-07 NOTE — Progress Notes (Signed)
Physical Therapy Treatment Patient Details Name: Elizabeth Thompson MRN: [id number] DOB: 08/09/1951 Today's Date: 03/07/2019    History of Present Illness  Elizabeth Thompson is a 68 y.o. female with medical history significant of bipolar disorder, pulmonary hypertension with COPD on 6 L nasal cannula at baseline, diabetes type 2 who presented with worsening shortness of breath and a nonproductive cough.  Work up includes acute on chronic hypoxic respiratory failure in the setting of decompensated pulmonary HTN, cor pulmonale and diastolic dysfunction with pulmonary edema.    PT Comments    Patient received in chair, very pleasant and motivated to participate with PT today. SpO2 at rest on 8LPM 97%. Able to complete functional transfers with RW and S, and required Min guard for safety with RW for gait distances of 20f and 526fwith one seated rest break in between. SpO2 appeared to remain WNL/above 90% on 8LPM O2 during gait, however HR up to 123BPM and recovered to WNL with seated rest. Gait tolerance and quality of mobility is gradually improving in general. She was left up in the chair with all needs met, chair alarm active this afternoon.    Follow Up Recommendations  SNF;Supervision/Assistance - 24 hour     Equipment Recommendations  None recommended by PT    Recommendations for Other Services       Precautions / Restrictions Precautions Precautions: Fall;Other (comment) Precaution Comments: watch SpO2/HR  Restrictions Weight Bearing Restrictions: No    Mobility  Bed Mobility               General bed mobility comments: OOB in chair   Transfers Overall transfer level: Needs assistance Equipment used: Rolling walker (2 wheeled) Transfers: Sit to/from Stand Sit to Stand: Supervision         General transfer comment: S for safety, cues for hand placement and sequencing   Ambulation/Gait Ambulation/Gait assistance: Min guard Gait Distance (Feet): 90 Feet(4044f [id number] device: Rolling walker (2 wheeled) Gait Pattern/deviations: Step-to pattern;Decreased step length - right;Decreased step length - left;Trunk flexed     General Gait Details: SpO2 appeared to remain WNL on 8LPM O2, however HR elevated to as high as 123BPM. patient with improved gait tolerance but remains easily fatigued    Stairs             Wheelchair Mobility    Modified Rankin (Stroke Patients Only)       Balance Overall balance assessment: Needs assistance Sitting-balance support: No upper extremity supported;Feet supported Sitting balance-Leahy Scale: Good     Standing balance support: During functional activity;Single extremity supported Standing balance-Leahy Scale: Fair                              Cognition Arousal/Alertness: Awake/alert Behavior During Therapy: WFL for tasks assessed/performed Overall Cognitive Status: Within Functional Limits for tasks assessed                                 General Comments: flat affect, pleasant, minimal verbalizations      Exercises      General Comments        Pertinent Vitals/Pain Pain Assessment: No/denies pain    Home Living                      Prior Function  PT Goals (current goals can now be found in the care plan section) Acute Rehab PT Goals Patient Stated Goal: to get to her chair PT Goal Formulation: With patient Time For Goal Achievement: 03/11/19 Potential to Achieve Goals: Good Progress towards PT goals: Progressing toward goals    Frequency    Min 3X/week      PT Plan Current plan remains appropriate    Co-evaluation              AM-PAC PT "6 Clicks" Mobility   Outcome Measure  Help needed turning from your back to your side while in a flat bed without using bedrails?: None Help needed moving from lying on your back to sitting on the side of a flat bed without using bedrails?: None Help needed moving to  and from a bed to a chair (including a wheelchair)?: A Little Help needed standing up from a chair using your arms (e.g., wheelchair or bedside chair)?: A Little Help needed to walk in hospital room?: A Little Help needed climbing 3-5 steps with a railing? : A Lot 6 Click Score: 19    End of Session Equipment Utilized During Treatment: Oxygen Activity Tolerance: Patient tolerated treatment well Patient left: in chair;with chair alarm set;with call bell/phone within reach   PT Visit Diagnosis: Other abnormalities of gait and mobility (R26.89);Difficulty in walking, not elsewhere classified (R26.2)     Time: 9470-7615 PT Time Calculation (min) (ACUTE ONLY): 23 min  Charges:  $Gait Training: 23-37 mins                     Deniece Ree PT, DPT, CBIS  Supplemental Physical Therapist [place]    Pager [phone] Acute Rehab Office [phone]

## 2019-03-07 NOTE — Plan of Care (Signed)
  Problem: Education: Goal: Knowledge of General Education information will improve Description Including pain rating scale, medication(s)/side effects and non-pharmacologic comfort measures Outcome: Progressing   Problem: Health Behavior/Discharge Planning: Goal: Ability to manage health-related needs will improve Outcome: Progressing   Problem: Clinical Measurements: Goal: Ability to maintain clinical measurements within normal limits will improve Outcome: Progressing Goal: Will remain free from infection Outcome: Progressing Goal: Diagnostic test results will improve Outcome: Progressing Goal: Respiratory complications will improve Outcome: Progressing Goal: Cardiovascular complication will be avoided Outcome: Progressing   Problem: Activity: Goal: Risk for activity intolerance will decrease Outcome: Progressing   Problem: Nutrition: Goal: Adequate nutrition will be maintained Outcome: Progressing   Problem: Coping: Goal: Level of anxiety will decrease Outcome: Progressing   Problem: Elimination: Goal: Will not experience complications related to bowel motility Outcome: Progressing Goal: Will not experience complications related to urinary retention Outcome: Progressing   Problem: Pain Managment: Goal: General experience of comfort will improve Outcome: Progressing   Problem: Safety: Goal: Ability to remain free from injury will improve Outcome: Progressing   Problem: Skin Integrity: Goal: Risk for impaired skin integrity will decrease Outcome: Progressing   Problem: Education: Goal: Knowledge of disease or condition will improve Outcome: Progressing Goal: Knowledge of the prescribed therapeutic regimen will improve Outcome: Progressing Goal: Individualized Educational Video(s) Outcome: Progressing   Problem: Activity: Goal: Ability to tolerate increased activity will improve Outcome: Progressing Goal: Will verbalize the importance of balancing  activity with adequate rest periods Outcome: Progressing   Problem: Respiratory: Goal: Ability to maintain a clear airway will improve Outcome: Progressing Goal: Levels of oxygenation will improve Outcome: Progressing Goal: Ability to maintain adequate ventilation will improve Outcome: Progressing   Problem: Education: Goal: Ability to demonstrate management of disease process will improve Outcome: Progressing Goal: Ability to verbalize understanding of medication therapies will improve Outcome: Progressing Goal: Individualized Educational Video(s) Outcome: Progressing   Problem: Activity: Goal: Capacity to carry out activities will improve Outcome: Progressing   Problem: Cardiac: Goal: Ability to achieve and maintain adequate cardiopulmonary perfusion will improve Outcome: Progressing

## 2019-03-07 NOTE — Consult Note (Addendum)
[facility] Inpatient Consult   03/07/2019  Elizabeth Thompson 12-Mar-1951 [id number]    Patient's chart reviewed and screened for [facility] services through her [facility] plan, with extreme risk of 44% for unplanned readmissions.   Chart review reveals that patient was admitted to the hospital with worsening shortness of breath and non-productive cough with hypoxia (pulmonary HTN, COPD exacerbation with chronic bronchitis). Patient is oxygen dependent, from home with niece [name]).  [name] is a 68 year old female with past medical history of end-stage COPD, pulmonary hypertension, diabetes type 2, hypertension, chronic back pain, cardiac cath 7218, diastolic heart failure EF 55 to 60%, chronic kidney disease stage IV, schizoaffective disorder.  Patient's current primary care provider is Everardo Beals, NP with [facility], not a [facility] provider and not an [employer] trader.  Expected discharge plan for patient is Home with [facility]osp[facility] per transition of care CM note.   [facility]) hospital liaison is aware of referral.   No identifiable needs from [facility] at this point.   Note update:  Per hospital liaison note on 03/02/19 patient's primary care provider is NOT a [facility] provider.    For questions and referral, please contact:  Reinaldo Helt A. Keshana Klemz, BSN, RN-BC [facility] Liaison Cell: [phone]

## 2019-03-08 LAB — GLUCOSE, CAPILLARY
Glucose-Capillary: 253 mg/dL — ABNORMAL HIGH (ref 70–99)
Glucose-Capillary: 120 mg/dL — ABNORMAL HIGH (ref 70–99)
Glucose-Capillary: 197 mg/dL — ABNORMAL HIGH (ref 70–99)
Glucose-Capillary: 216 mg/dL — ABNORMAL HIGH (ref 70–99)
Glucose-Capillary: 252 mg/dL — ABNORMAL HIGH (ref 70–99)

## 2019-03-08 LAB — RENAL FUNCTION PANEL
Albumin: 3.4 g/dL — ABNORMAL LOW (ref 3.5–5.0)
Anion gap: 13 (ref 5–15)
BUN: 48 mg/dL — ABNORMAL HIGH (ref 8–23)
CO2: 28 mmol/L (ref 22–32)
Calcium: 9.4 mg/dL (ref 8.9–10.3)
Chloride: 94 mmol/L — ABNORMAL LOW (ref 98–111)
Creatinine, Ser: 2.57 mg/dL — ABNORMAL HIGH (ref 0.44–1.00)
GFR calc Af Amer: 21 mL/min — ABNORMAL LOW (ref >60)
GFR, EST NON AFRICAN AMERICAN: 18 mL/min — AB (ref >60)
Glucose, Bld: 220 mg/dL — ABNORMAL HIGH (ref 70–99)
POTASSIUM: 3.9 mmol/L (ref 3.5–5.1)
Phosphorus: 4.4 mg/dL (ref 2.5–4.6)
Sodium: 135 mmol/L (ref 135–145)

## 2019-03-08 LAB — MAGNESIUM: Magnesium: 2.3 mg/dL (ref 1.7–2.4)

## 2019-03-08 NOTE — Progress Notes (Signed)
Physical Therapy Treatment Patient Details Name: Elizabeth Thompson MRN: [id number] DOB: Aug 08, 1951 Today's Date: 03/08/2019    History of Present Illness  Elizabeth Thompson is a 68 y.o. female with medical history significant of bipolar disorder, pulmonary hypertension with COPD on 6 L nasal cannula at baseline, diabetes type 2 who presented with worsening shortness of breath and a nonproductive cough.  Work up includes acute on chronic hypoxic respiratory failure in the setting of decompensated pulmonary HTN, cor pulmonale and diastolic dysfunction with pulmonary edema.    PT Comments    Patient received in bed, very pleasant and willing to participate in session today. Able to complete bed mobility with Mod(I), continues to require S for functional transfers with RW and min guard for gait 36fx2 (1244ftotal) today. SpO2 on 8LPM appeared to drop into 70s-80s (poor signal quality), increased to 10LPM and dropped into 80s with gait but improved to WNL with seated rest and pursed lip breathing. Education provided on importance of pursed lip breathing and coughing/huffing to help clear mucous and improve respiratory clearance. She was left sitting up in chair with family member present, chair alarm active, all needs otherwise met.     Follow Up Recommendations  SNF;Supervision/Assistance - 24 hour     Equipment Recommendations  None recommended by PT    Recommendations for Other Services       Precautions / Restrictions Precautions Precautions: Fall;Other (comment) Precaution Comments: watch SpO2/HR  Restrictions Weight Bearing Restrictions: No    Mobility  Bed Mobility Overal bed mobility: Modified Independent             General bed mobility comments: no physical assist given   Transfers Overall transfer level: Needs assistance Equipment used: Rolling walker (2 wheeled) Transfers: Sit to/from Stand Sit to Stand: Supervision         General transfer comment: S for  safety, cues for hand placement and sequencing   Ambulation/Gait Ambulation/Gait assistance: Min guard Gait Distance (Feet): 120 Feet(6088f2 ) Assistive device: Rolling walker (2 wheeled) Gait Pattern/deviations: Step-to pattern;Decreased step length - right;Decreased step length - left;Trunk flexed Gait velocity: decreased    General Gait Details: SpO2 dropping into 80s on 8LPM, increased to 10LPM with second bout of gait however also appeared to drop on 10LPM but recovered into 90s with seated rest    Stairs             Wheelchair Mobility    Modified Rankin (Stroke Patients Only)       Balance Overall balance assessment: Needs assistance Sitting-balance support: No upper extremity supported;Feet supported Sitting balance-Leahy Scale: Good     Standing balance support: During functional activity;Single extremity supported Standing balance-Leahy Scale: Fair                              Cognition Arousal/Alertness: Awake/alert Behavior During Therapy: WFL for tasks assessed/performed Overall Cognitive Status: Within Functional Limits for tasks assessed                                 General Comments: flat affect, pleasant, minimal verbalizations      Exercises      General Comments        Pertinent Vitals/Pain Pain Assessment: No/denies pain    Home Living  Prior Function            PT Goals (current goals can now be found in the care plan section) Acute Rehab PT Goals Patient Stated Goal: to get to her chair PT Goal Formulation: With patient Time For Goal Achievement: 03/11/19 Potential to Achieve Goals: Good Progress towards PT goals: Progressing toward goals    Frequency    Min 3X/week      PT Plan Current plan remains appropriate    Co-evaluation PT/OT/SLP Co-Evaluation/Treatment: Yes Reason for Co-Treatment: Complexity of the patient's impairments (multi-system  involvement);To address functional/ADL transfers PT goals addressed during session: Mobility/safety with mobility;Proper use of DME        AM-PAC PT "6 Clicks" Mobility   Outcome Measure  Help needed turning from your back to your side while in a flat bed without using bedrails?: None Help needed moving from lying on your back to sitting on the side of a flat bed without using bedrails?: None Help needed moving to and from a bed to a chair (including a wheelchair)?: A Little Help needed standing up from a chair using your arms (e.g., wheelchair or bedside chair)?: A Little Help needed to walk in hospital room?: A Little Help needed climbing 3-5 steps with a railing? : A Little 6 Click Score: 20    End of Session Equipment Utilized During Treatment: Oxygen Activity Tolerance: Patient tolerated treatment well Patient left: in chair;with call bell/phone within reach;with chair alarm set;with family/visitor present   PT Visit Diagnosis: Other abnormalities of gait and mobility (R26.89);Difficulty in walking, not elsewhere classified (R26.2)     Time: 5183-3582 PT Time Calculation (min) (ACUTE ONLY): 38 min  Charges:  $Gait Training: 8-22 mins $Therapeutic Activity: 8-22 mins                     Deniece Ree PT, DPT, CBIS  Supplemental Physical Therapist [place]    Pager [phone] Acute Rehab Office [phone]

## 2019-03-08 NOTE — Progress Notes (Signed)
Occupational Therapy Treatment Patient Details Name: Elizabeth Thompson MRN: [id number] DOB: 1951/06/10 Today's Date: 03/08/2019    History of present illness  Elizabeth Thompson is a 68 y.o. female with medical history significant of bipolar disorder, pulmonary hypertension with COPD on 6 L nasal cannula at baseline, diabetes type 2 who presented with worsening shortness of breath and a nonproductive cough.  Work up includes acute on chronic hypoxic respiratory failure in the setting of decompensated pulmonary HTN, cor pulmonale and diastolic dysfunction with pulmonary edema.   OT comments  Pt performing bed mobility with supervisionA; minA for transfers and ADL functional mobility with RW. Pt performing mobility in room from bed to sink and in hallway a household distance x2. Pt performing grooming with set-upA in standing x1 min. Pt would greatly benefit from continued OT skilled services for ADL, mobility and safety in [facility] setting. OT to follow acutely.    Follow Up Recommendations  SNF    Equipment Recommendations       Recommendations for Other Services      Precautions / Restrictions Precautions Precautions: Fall;Other (comment) Precaution Comments: watch SpO2/HR  Restrictions Weight Bearing Restrictions: No       Mobility Bed Mobility Overal bed mobility: Modified Independent             General bed mobility comments: no physical assist given   Transfers Overall transfer level: Needs assistance Equipment used: Rolling walker (2 wheeled) Transfers: Sit to/from Stand Sit to Stand: Supervision         General transfer comment: S for safety, cues for hand placement and sequencing     Balance Overall balance assessment: Needs assistance Sitting-balance support: No upper extremity supported;Feet supported Sitting balance-Leahy Scale: Good     Standing balance support: During functional activity;Single extremity supported Standing balance-Leahy Scale: Fair                              ADL either performed or assessed with clinical judgement   ADL Overall ADL's : Needs assistance/impaired Eating/Feeding: Independent;Sitting   Grooming: Wash/dry hands;Min guard;Standing                               Functional mobility during ADLs: Min guard;Rolling walker General ADL Comments: Pt completed grooming task standing at sink. Cues for breathing technique and rest breaks. Discussed importance of both rest breaks and activity to build activity tolerance.      Vision Baseline Vision/History: Wears glasses Wears Glasses: Reading only Patient Visual Report: No change from baseline Vision Assessment?: No apparent visual deficits   Perception     Praxis      Cognition Arousal/Alertness: Awake/alert Behavior During Therapy: WFL for tasks assessed/performed Overall Cognitive Status: Within Functional Limits for tasks assessed                                 General Comments: flat affect, pleasant, minimal verbalizations        Exercises     Shoulder Instructions       General Comments      Pertinent Vitals/ Pain       Pain Assessment: No/denies pain  Home Living Family/patient expects to be discharged to:: Private residence Living Arrangements: Other relatives Available Help at Discharge: Family;Available 24 hours/day Type of Home: House Home Access: Stairs to  enter Entrance Stairs-Number of Steps: 2 on the side, and 4 in the front Entrance Stairs-Rails: None Home Layout: One level     Bathroom Shower/Tub: Teacher, early years/pre: Standard Bathroom Accessibility: Yes   Home Equipment: Clinical cytogeneticist - 4 wheels;Wheelchair - manual;Bedside commode;Grab bars - tub/shower   Additional Comments: Lives with Niece      Prior Functioning/Environment Level of Independence: Needs assistance  Gait / Transfers Assistance Needed: Uses the rollator for support ADL's / Homemaking  Assistance Needed: Niece states pt does what she can until she gets fatigued and then niece helps her finish.        Frequency  Min 2X/week        Progress Toward Goals  OT Goals(current goals can now be found in the care plan section)     Acute Rehab OT Goals Patient Stated Goal: to get to her chair OT Goal Formulation: With patient Time For Goal Achievement: 03/22/19 Potential to Achieve Goals: Good  Plan      Co-evaluation    PT/OT/SLP Co-Evaluation/Treatment: Yes Reason for Co-Treatment: Complexity of the patient's impairments (multi-system involvement);To address functional/ADL transfers   OT goals addressed during session: ADL's and self-care;Other (comment)(transfers)      AM-PAC OT "6 Clicks" Daily Activity     Outcome Measure   Help from another person eating meals?: None Help from another person taking care of personal grooming?: A Little Help from another person toileting, which includes using toliet, bedpan, or urinal?: A Little Help from another person bathing (including washing, rinsing, drying)?: A Lot Help from another person to put on and taking off regular upper body clothing?: A Little Help from another person to put on and taking off regular lower body clothing?: A Lot 6 Click Score: 17    End of Session Equipment Utilized During Treatment: Oxygen  OT Visit Diagnosis: Unsteadiness on feet (R26.81);Muscle weakness (generalized) (M62.81)   Activity Tolerance Patient limited by fatigue   Patient Left in chair;with call bell/phone within reach;with chair alarm set   Nurse Communication Mobility status        Time: 6378-5885 OT Time Calculation (min): 38 min  Charges: OT General Charges $OT Visit: 1 Visit OT Treatments $Self Care/Home Management : 8-22 mins  Darryl Nestle) Marsa Aris OTR/L Acute Rehabilitation Services Pager: [phone] Office: [phone]    Fredda Hammed 03/08/2019, 4:42 PM

## 2019-03-08 NOTE — Progress Notes (Signed)
PROGRESS NOTE    Elizabeth Thompson  HGD:[id number] DOB: 1951-11-28 DOA: 02/24/2019 PCP: Everardo Beals, NP   Brief Narrative:  68 year old with history of bipolar disorder, pulmonary hypertension, COPD on 6 L nasal cannula, diabetes mellitus type 2 came to the hospital with worsening of shortness of breath and nonproductive cough with hypoxia.  She started receiving IV Solu-Medrol and bronchodilators.  She was initially on BiPAP also subsequently thought there was concerns of fluid overload therefore started on IV diuretics.  Pulmonary, nephrology, cardiology and palliative care teams were consulted.  With aggressive diuresis patient sodium level is improved.  Breathing continues to improve very slowly.   Cardiology likely going to discontinue OPSUMIT.  We will transition patient home soon.  Assessment & Plan:   Principal Problem:   Pulmonary hypertension (HCC) Active Problems:   Diabetes mellitus type 2, controlled, without complications (HCC)   COPD exacerbation (HCC)   COPD GOLD 0    Acute on chronic respiratory failure (HCC)   Goals of care, counseling/discussion   Cor pulmonale, chronic (HCC)   Acute on chronic right-sided heart failure (HCC)   Hyponatremia with excess extracellular fluid volume   Acute hypoxic respiratory failure requiring 10 L nasal cannula/nonrebreather Multifactorial- acute COPD exacerbation Group 3 pulmonary arterial hypertension -Cont Oral Prednisone.  Aggressive bronchodilator treatments. Dulera.  Incentive spirometry and flutter valve. CM informed to help arrange for higher Oxygen concentrator at home.  Oxymizer ordered (DME).  - Supplemental oxygen as needed.  Continue sildenafil and OPSUMIT.  Per cardiology, should be able to stop OPSUMIT. -Was on Augmentin.  Now stopped. -Due to rising creatinine, Lasix 80 mg orally daily  Hyponatremia, resolved -Cortisol stim test-normal.  Significantly improved with diuresis.  Acute on chronic renal failure,  stage III, stable - Creatinine stable around 2.4  Acute on chronic diastolic congestive heart failure, class IV - Echocardiogram January 2020-normal ejection fraction grade 1 diastolic dysfunction. .  Now on lasix 15m po daily.   Anemia of chronic disease, stage IV.  Baseline creatinine 1.8 - Iron deficiency noted.  Iron supplements  And Epo.  We can consider transfusing her another unit of blood to keep hemoglobin above 8.0.   Essential hypertension -In acceptable range.   Hyperlipidemia -Continue Lipitor 20 mg daily  Diabetes mellitus type 2 -Accu-Cheks and sliding scale.  Continue glipizide.  Metformin is on hold.  Bipolar disorder/dementia -Abilify and Aricept on hold due to hyponatremia  PT recommends SNF but patient will be going home with home hospice  DVT prophylaxis: SCDs Code Status: DNR Family Communication: Nieces at the bedside Disposition Plan: Home with hospice tomorrow  Consultants:   Pulmonary  Nephrology  Palliative care   Subjective: No acute events overnight.  Still remains on 10 L nasal cannula.  Otherwise feels okay.  Review of Systems Otherwise negative except as per HPI, including: General = no fevers, chills, dizziness, malaise, fatigue HEENT/EYES = negative for pain, redness, loss of vision, double vision, blurred vision, loss of hearing, sore throat, hoarseness, dysphagia Cardiovascular= negative for chest pain, palpitation, murmurs, lower extremity swelling Respiratory/lungs= negative for  hemoptysis, wheezing, mucus production Gastrointestinal= negative for nausea, vomiting,, abdominal pain, melena, hematemesis Genitourinary= negative for Dysuria, Hematuria, Change in Urinary Frequency MSK = Negative for arthralgia, myalgias, Back Pain, Joint swelling  Neurology= Negative for headache, seizures, numbness, tingling  Psychiatry= Negative for anxiety, depression, suicidal and homocidal ideation Allergy/Immunology= Medication/Food allergy  as listed  Skin= Negative for Rash, lesions, ulcers, itching   Objective: Vitals:  03/08/19 0454 03/08/19 0803 03/08/19 0804 03/08/19 0834  BP: 109/67     Pulse: 78     Resp: 18     Temp:    (!) 97.1 F (36.2 C)  TempSrc:    Axillary  SpO2: 97% 96% 96%   Weight:      Height:        Intake/Output Summary (Last 24 hours) at 03/08/2019 0958 Last data filed at 03/07/2019 2200 Gross per 24 hour  Intake 240 ml  Output 601 ml  Net -361 ml   Filed Weights   03/06/19 0612 03/07/19 0500 03/08/19 0453  Weight: 87.1 kg 88.9 kg 88.3 kg    Examination:  Constitutional: NAD, calm, comfortable, 10 L nasal cannula Eyes: PERRL, lids and conjunctivae normal ENMT: Mucous membranes are moist. Posterior pharynx clear of any exudate or lesions.Normal dentition.  Neck: normal, supple, no masses, no thyromegaly Respiratory: Mostly is clear to auscultation bilaterally Cardiovascular: Regular rate and rhythm, no murmurs / rubs / gallops. No extremity edema. 2+ pedal pulses. No carotid bruits.  Abdomen: no tenderness, no masses palpated. No hepatosplenomegaly. Bowel sounds positive.  Musculoskeletal: no clubbing / cyanosis. No joint deformity upper and lower extremities. Good ROM, no contractures. Normal muscle tone.  Skin: no rashes, lesions, ulcers. No induration Neurologic: CN 2-12 grossly intact. Sensation intact, DTR normal. Strength 4/5 in all 4.  Psychiatric: Normal judgment and insight. Alert and oriented x 3. Normal mood.    Data Reviewed:   CBC: Recent Labs  Lab 03/02/19 0402 03/03/19 0424 03/04/19 0415  WBC 14.1* 11.8* 11.6*  HGB 8.6* 8.6* 8.9*  HCT 29.7* 29.8* 30.2*  MCV 83.7 85.9 88.0  PLT 532* 483* 093   Basic Metabolic Panel: Recent Labs  Lab 03/03/19 0424 03/04/19 0415 03/05/19 0447 03/06/19 0525 03/07/19 0414 03/08/19 0404  NA 138 136 137 136 134* 135  K 3.7 3.0* 3.7 3.9 4.1 3.9  CL 91* 91* 93* 95* 91* 94*  CO2 35* 35* 32 _0 GLUCOSE 112* 107* 117* 95  305* 220*  BUN 40* 37* 37* 40* 41* 48*  CREATININE 2.12*  2.24* 2.10* 1.99* 2.44* 2.33* 2.57*  CALCIUM 9.7 9.4 9.6 9.6 9.5 9.4  MG 1.9 1.9  --  2.2 2.1 2.3  PHOS 4.2 3.8 3.9 4.0 4.2 4.4   GFR: Estimated Creatinine Clearance: 22.1 mL/min (A) (by C-G formula based on SCr of 2.57 mg/dL (H)). Liver Function Tests: Recent Labs  Lab 03/04/19 0415 03/05/19 0447 03/06/19 0525 03/07/19 0414 03/08/19 0404  ALBUMIN 3.0* 3.1* 3.3* 3.2* 3.4*   No results for input(s): LIPASE, AMYLASE in the last 168 hours. No results for input(s): AMMONIA in the last 168 hours. Coagulation Profile: No results for input(s): INR, PROTIME in the last 168 hours. Cardiac Enzymes: No results for input(s): CKTOTAL, CKMB, CKMBINDEX, TROPONINI in the last 168 hours. BNP (last 3 results) No results for input(s): PROBNP in the last 8760 hours. HbA1C: No results for input(s): HGBA1C in the last 72 hours. CBG: Recent Labs  Lab 03/07/19 1141 03/07/19 1637 03/07/19 2103 03/08/19 0207 03/08/19 0748  GLUCAP 140* 192* 265* 253* 120*   Lipid Profile: No results for input(s): CHOL, HDL, LDLCALC, TRIG, CHOLHDL, LDLDIRECT in the last 72 hours. Thyroid Function Tests: No results for input(s): TSH, T4TOTAL, FREET4, T3FREE, THYROIDAB in the last 72 hours. Anemia Panel: No results for input(s): VITAMINB12, FOLATE, FERRITIN, TIBC, IRON, RETICCTPCT in the last 72 hours. Sepsis Labs: Recent Labs  Lab 03/03/19 0424  PROCALCITON 0.20    No results found for this or any previous visit (from the past 240 hour(s)).       Radiology Studies: No results found.      Scheduled Meds: . aspirin EC  81 mg Oral Daily  . atorvastatin  20 mg Oral QPC supper  . brimonidine  1 drop Both Eyes BID  . darbepoetin (ARANESP) injection - NON-DIALYSIS  150 mcg Subcutaneous Q Mon-1800  . dorzolamide-timolol  1 drop Both Eyes BID  . feeding supplement  1 Container Oral TID BM  . ferrous sulfate  325 mg Oral QPC supper  .  furosemide  80 mg Oral Daily  . glimepiride  2 mg Oral Q breakfast  . insulin aspart  0-20 Units Subcutaneous TID WC  . insulin aspart  0-5 Units Subcutaneous QHS  . insulin aspart  4 Units Subcutaneous TID WC  . ipratropium-albuterol  3 mL Nebulization TID  . lamoTRIgine  100 mg Oral QPM  . latanoprost  1 drop Both Eyes QHS  . macitentan  10 mg Oral Daily  . magnesium oxide  200 mg Oral QODAY  . mouth rinse  15 mL Mouth Rinse BID  . mometasone-formoterol  2 puff Inhalation BID  . polyethylene glycol  17 g Oral Daily  . predniSONE  40 mg Oral Q breakfast  . sildenafil  20 mg Oral TID  . sodium bicarbonate  1,300 mg Oral TID  . sodium chloride flush  3 mL Intravenous Q12H   Continuous Infusions: . sodium chloride       LOS: 12 days   Time spent=  25 mins     Arsenio Loader, MD [facility]  If 7PM-7AM, please contact night-coverage [email or web address] 03/08/2019, 9:58 AM

## 2019-03-09 LAB — GLUCOSE, CAPILLARY: Glucose-Capillary: 87 mg/dL (ref 70–99)

## 2019-03-09 MED ORDER — SODIUM BICARBONATE 650 MG PO TABS: 1300.0000 mg | ORAL_TABLET | Freq: Three times a day (TID) | ORAL | 0 refills | Status: AC

## 2019-03-09 MED ORDER — OXYCODONE HCL 5 MG PO TABS: 5.0000 mg | ORAL_TABLET | Freq: Two times a day (BID) | ORAL | 0 refills | Status: AC | PRN

## 2019-03-09 MED FILL — oxyCODONE HCL 5 MG TABS: 5 | 5 days supply | Qty: 10 | Fill #0

## 2019-03-09 MED FILL — SODIUM BICARB 10 GRAIN TAB: 650 | 7 days supply | Qty: 42 | Fill #0

## 2019-03-09 NOTE — Progress Notes (Signed)
Physical Therapy Treatment Patient Details Name: Elizabeth Thompson MRN: [id number] DOB: [birth year] Today's Date: 03/09/2019    History of Present Illness  Elizabeth Thompson is a 68 y.o. female with medical history significant of bipolar disorder, pulmonary hypertension with COPD on 6 L nasal cannula at baseline, diabetes type 2 who presented with worsening shortness of breath and a nonproductive cough.  Work up includes acute on chronic hypoxic respiratory failure in the setting of decompensated pulmonary HTN, cor pulmonale and diastolic dysfunction with pulmonary edema.    PT Comments    Patient received in bed, very pleasant and willing to work with PT today. Able to complete bed mobility with Mod(I) and otherwise required S for functional transfers and gait 4fx2 with RW. Unable to get accurate pulse ox signal during gait however suspect ongoing desaturation as patient continues to show sats in the high 70s/low 80s with seated rest breaks between gait periods. Able to recover to SpO2 above 90 with extended rest breaks and cues for pursed lip breathing, HR WNL today. She was left up in the chair with chair alarm active, all needs otherwise met, RN present and attending.     Follow Up Recommendations  SNF;Supervision/Assistance - 24 hour     Equipment Recommendations  None recommended by PT    Recommendations for Other Services       Precautions / Restrictions Precautions Precautions: Fall;Other (comment) Precaution Comments: watch SpO2/HR  Restrictions Weight Bearing Restrictions: No    Mobility  Bed Mobility Overal bed mobility: Modified Independent             General bed mobility comments: no physical assist given   Transfers Overall transfer level: Needs assistance Equipment used: Rolling walker (2 wheeled) Transfers: Sit to/from Stand Sit to Stand: Supervision         General transfer comment: S for safety, cues for hand placement and sequencing    Ambulation/Gait Ambulation/Gait assistance: Supervision Gait Distance (Feet): 140 Feet(723f2 ) Assistive device: Rolling walker (2 wheeled) Gait Pattern/deviations: Step-to pattern;Decreased step length - right;Decreased step length - left;Trunk flexed Gait velocity: decreased    General Gait Details: unable to get accurate SpO2 during movement, however with return to seated rest demonstrated desat into low 80s/high 70s even on 10LPM O2; able to recover with extended rest break and cues for pursed lip breathing. Otherwise [place] to and quality of mobility is improving    StChief Strategy Officer  Modified Rankin (Stroke Patients Only)       Balance Overall balance assessment: Needs assistance Sitting-balance support: No upper extremity supported;Feet supported Sitting balance-Leahy Scale: Good     Standing balance support: During functional activity;No upper extremity supported Standing balance-Leahy Scale: Fair                              Cognition Arousal/Alertness: Awake/alert Behavior During Therapy: WFL for tasks assessed/performed Overall Cognitive Status: Within Functional Limits for tasks assessed                                 General Comments: flat affect, pleasant, minimal verbalizations      Exercises      General Comments        Pertinent Vitals/Pain Pain Assessment: No/denies pain    Home Living  Prior Function            PT Goals (current goals can now be found in the care plan section) Acute Rehab PT Goals Patient Stated Goal: to get to her chair PT Goal Formulation: With patient Time For Goal Achievement: 03/11/19 Potential to Achieve Goals: Good Progress towards PT goals: Progressing toward goals    Frequency    Min 3X/week      PT Plan Current plan remains appropriate    Co-evaluation              AM-PAC PT "6 Clicks" Mobility    Outcome Measure  Help needed turning from your back to your side while in a flat bed without using bedrails?: None Help needed moving from lying on your back to sitting on the side of a flat bed without using bedrails?: None Help needed moving to and from a bed to a chair (including a wheelchair)?: None Help needed standing up from a chair using your arms (e.g., wheelchair or bedside chair)?: None Help needed to walk in hospital room?: A Little Help needed climbing 3-5 steps with a railing? : A Little 6 Click Score: 22    End of Session Equipment Utilized During Treatment: Oxygen Activity Tolerance: Patient tolerated treatment well Patient left: in chair;with call bell/phone within reach;with chair alarm set;with nursing/sitter in room   PT Visit Diagnosis: Other abnormalities of gait and mobility (R26.89);Difficulty in walking, not elsewhere classified (R26.2)     Time: 3790-2409 PT Time Calculation (min) (ACUTE ONLY): 32 min  Charges:  $Gait Training: 23-37 mins                     Deniece Ree PT, DPT, CBIS  Supplemental Physical Therapist [place]    Pager [phone] Acute Rehab Office [phone]

## 2019-03-09 NOTE — Discharge Summary (Signed)
Physician Discharge Summary  Elizabeth Thompson ZCH:[id number] DOB: 1951-07-26 DOA: 02/24/2019  PCP: Everardo Beals, NP  Admit date: 02/24/2019 Discharge date: 03/09/2019  Admitted From: Home Disposition: Home with home hospice  Recommendations for Outpatient Follow-up:  1. Follow up with PCP in 1-2 weeks 2. Please obtain BMP/CBC in one week your next doctors visit.  3. Home pain medications with bowel regimen given 4. Discontinue OPSUMIT per cardiology   Discharge Condition: Stable CODE STATUS: DNR Diet recommendation: As tolerated  Brief/Interim Summary: 68 year old with history of bipolar disorder, pulmonary hypertension, COPD on 6 L nasal cannula, diabetes mellitus type 2 came to the hospital with worsening of shortness of breath and nonproductive cough with hypoxia.  She started receiving IV Solu-Medrol and bronchodilators.  She was initially on BiPAP also subsequently thought there was concerns of fluid overload therefore started on IV diuretics.  Pulmonary, nephrology, cardiology and palliative care teams were consulted.  With aggressive diuresis patient sodium level is improved.  Breathing continues to improve very slowly.   Cardiology likely going to discontinue OPSUMIT.  We will transition patient home with home hospice.   Discharge Diagnoses:  Principal Problem:   Pulmonary hypertension ([place]) Active Problems:   Diabetes mellitus type 2, controlled, without complications (HCC)   COPD exacerbation (HCC)   COPD GOLD 0    Acute on chronic respiratory failure (HCC)   Goals of care, counseling/discussion   Cor pulmonale, chronic (HCC)   Acute on chronic right-sided heart failure (HCC)   Hyponatremia with excess extracellular fluid volume   Acute hypoxic respiratory failure requiring 10 L nasal cannula/nonrebreather Multifactorial- acute COPD exacerbation Group 3 pulmonary arterial hypertension -Steroids discontinued.  Continue home bronchodilators.  Advised to continue  using incentive spirometry and flutter valve as needed at home.  Oxymizer has been arranged for.  Discontinue OPSUMIT per cardiology.  Continue sildenafil.  Appreciate input from cardiology team. - Continue 80 mg of oral Lasix  Hyponatremia, resolved -Cortisol stim test-normal.  Significantly improved with diuresis.  Acute on chronic renal failure, stage III, stable -  Stable now around 2.4.  Acute on chronic diastolic congestive heart failure, class IV - Echocardiogram January 2020-normal ejection fraction grade 1 diastolic dysfunction. .  Now on lasix 31m po daily.   Anemia of chronic disease, stage IV.  Baseline creatinine 1.8 -  Stable.  Continue supplements.  Essential hypertension -In acceptable range.   Hyperlipidemia -Continue Lipitor 20 mg daily  Diabetes mellitus type 2 -Accu-Cheks and sliding scale.  Continue glipizide.  Metformin is on hold.  Bipolar disorder/dementia -Abilify and Aricept on hold due to hyponatremia  Patient to be transitioned home with home hospice.  Consultations:  Cardiology  Subjective: Feels better.  No new complaints.  Discharge Exam: Vitals:   03/09/19 0400 03/09/19 0828  BP: 109/63   Pulse: 69 72  Resp: 16 18  Temp:    SpO2: 99% 96%   Vitals:   03/09/19 0358 03/09/19 0400 03/09/19 0500 03/09/19 0828  BP:  109/63    Pulse:  69  72  Resp:  16  18  Temp: 98 F (36.7 C)     TempSrc: Oral     SpO2:  99%  96%  Weight:   87.8 kg   Height:        General: Pt is alert, awake, not in acute distress, remains on 8-10 L of oxygen. Cardiovascular: RRR, S1/S2 +, no rubs, no gallops Respiratory: CTA bilaterally, no wheezing, no rhonchi Abdominal: Soft, NT, ND, bowel sounds +  Extremities: no edema, no cyanosis  Discharge Instructions   Allergies as of 03/09/2019      Reactions   Codeine Nausea And Vomiting   Nitrostat [nitroglycerin] Other (See Comments)   Pt is on revatio   Prednisone Other (See Comments)   Has to  monitor because she is a diabetic       Medication List    STOP taking these medications   Opsumit 10 MG tablet Generic drug:  macitentan   OXYGEN     TAKE these medications   acetaminophen 650 MG CR tablet Commonly known as:  TYLENOL Take 1,300 mg by mouth every 12 (twelve) hours as needed for pain.   albuterol (2.5 MG/3ML) 0.083% nebulizer solution Commonly known as:  PROVENTIL Take 2.5 mg by nebulization every 4 (four) hours as needed for wheezing or shortness of breath.   ProAir HFA 108 (90 Base) MCG/ACT inhaler Generic drug:  albuterol Inhale 2 puffs into the lungs every 6 (six) hours as needed for wheezing or shortness of breath.   amLODipine 10 MG tablet Commonly known as:  NORVASC Take 1 tablet (10 mg total) by mouth daily.   ARIPiprazole 20 MG tablet Commonly known as:  ABILIFY Take 1 tablet (20 mg total) by mouth at bedtime.   ASPERCREME LIDOCAINE EX Apply 1 application topically 2 (two) times daily as needed (back pain).   aspirin 81 MG EC tablet Take 1 tablet (81 mg total) by mouth daily.   atorvastatin 20 MG tablet Commonly known as:  LIPITOR Take 20 mg by mouth daily after supper.   BIOFREEZE EX Apply 1 application topically 2 (two) times daily as needed (back pain).   bisacodyl 10 MG suppository Commonly known as:  DULCOLAX Place 1 suppository (10 mg total) rectally daily as needed for moderate constipation.   brimonidine 0.2 % ophthalmic solution Commonly known as:  ALPHAGAN Place 1 drop into both eyes 2 (two) times daily.   budesonide-formoterol 160-4.5 MCG/ACT inhaler Commonly known as:  SYMBICORT Inhale 2 puffs into the lungs 2 (two) times daily.   Combivent Respimat 20-100 MCG/ACT Aers respimat Generic drug:  Ipratropium-Albuterol Inhale 1-2 puffs into the lungs every 4 (four) hours as needed for wheezing.   donepezil 5 MG tablet Commonly known as:  ARICEPT Take 1 tablet (5 mg total) by mouth at bedtime.   dorzolamide-timolol  22.3-6.8 MG/ML ophthalmic solution Commonly known as:  COSOPT Place 1 drop into both eyes 2 (two) times daily.   feeding supplement Liqd Take 1 Container by mouth 3 (three) times daily between meals.   ferrous sulfate 325 (65 FE) MG tablet Take 325 mg by mouth daily after supper.   Flutter Devi Use as directed.   furosemide 80 MG tablet Commonly known as:  LASIX Take 1 tablet (80 mg total) by mouth daily.   glimepiride 4 MG tablet Commonly known as:  AMARYL Take 0.5 tablets (2 mg total) by mouth daily with breakfast.   glucose blood test strip Commonly known as:  Accu-Chek Aviva 1 each by Other route 2 (two) times daily.   lamoTRIgine 100 MG tablet Commonly known as:  LAMICTAL Take 1 tablet (100 mg total) by mouth every evening. What changed:  when to take this   latanoprost 0.005 % ophthalmic solution Commonly known as:  XALATAN Place 1 drop into both eyes at bedtime.   Magnesium 250 MG Tabs Take 250 mg by mouth See admin instructions. Take 1 tablet (250 mg) by mouth every other day after supper  metFORMIN 1000 MG tablet Commonly known as:  GLUCOPHAGE Take 1 tablet (1,000 mg total) by mouth daily with breakfast. What changed:  additional instructions   Mucinex Sinus-Max 10-650-400 MG/20ML Liqd Generic drug:  Phenylephrine-APAP-guaiFENesin Take 20 mLs by mouth 2 (two) times daily.   oxyCODONE 5 MG immediate release tablet Commonly known as:  Oxy IR/ROXICODONE Take 1 tablet (5 mg total) by mouth every 12 (twelve) hours as needed for up to 5 days for severe pain or breakthrough pain (SOB).   polyethylene glycol packet Commonly known as:  MIRALAX / GLYCOLAX Take 17 g by mouth daily. What changed:    when to take this  reasons to take this  additional instructions   Selenium Sulf-Pyrithione-Urea 2.25 % Sham Apply 1 application topically See admin instructions. Apply topically to scalp for dermatitis once every 14 days   senna 8.6 MG tablet Commonly known  as:  SENOKOT Take 1 tablet by mouth daily as needed for constipation.   sildenafil 20 MG tablet Commonly known as:  REVATIO Take 1 tablet (20 mg total) by mouth 3 (three) times daily.   sodium bicarbonate 650 MG tablet Take 2 tablets (1,300 mg total) by mouth 3 (three) times daily for 7 days.            Durable Medical Equipment  (From admission, onward)         Start     Ordered   03/06/19 1043  For home use only DME oxygen  Once    Comments:  With Oxymizer  Question Answer Comment  Mode or (Route) Nasal cannula   Frequency Continuous (stationary and portable oxygen unit needed)   Oxygen delivery system Gas      03/06/19 1042         Follow-up Information    Everardo Beals, NP. Schedule an appointment as soon as possible for a visit in 1 week(s).   Contact information: [address] [place] [address] [phone]          Allergies  Allergen Reactions  . Codeine Nausea And Vomiting  . Nitrostat [Nitroglycerin] Other (See Comments)    Pt is on revatio  . Prednisone Other (See Comments)    Has to monitor because she is a diabetic     You were cared for by a hospitalist during your hospital stay. If you have any questions about your discharge medications or the care you received while you were in the hospital after you are discharged, you can call the unit and asked to speak with the hospitalist on call if the hospitalist that took care of you is not available. Once you are discharged, your primary care physician will handle any further medical issues. Please note that no refills for any discharge medications will be authorized once you are discharged, as it is imperative that you return to your primary care physician (or establish a relationship with a primary care physician if you do not have one) for your aftercare needs so that they can reassess your need for medications and monitor your lab values.   Procedures/Studies: Dg Chest 2  View  Result Date: 02/25/2019 CLINICAL DATA:  Chronic bronchitis and COPD. EXAM: CHEST - 2 VIEW COMPARISON:  02/24/2019 FINDINGS: Cardiomegaly as seen previously. Slight improvement in interstitial lung density. Findings could represent a combination of bronchitis and interstitial edema. No consolidation, collapse or effusion. IMPRESSION: Radiographic improvement consistent with improving bronchitis and or interstitial edema. Electronically Signed   By: Jan Fireman.D.  On: 02/25/2019 10:43   US Renal  Result Date: 02/26/2019 CLINICAL DATA:  Acute renal failure. EXAM: RENAL / URINARY TRACT ULTRASOUND COMPLETE COMPARISON:  CT abdomen pelvis 04/27/2018 FINDINGS: Right Kidney: Renal measurements: 11.4 x 5.0 x 4.9 cm = volume: 146.6 mL. Normal renal cortical thickness. No hydronephrosis. Multiple hypoechoic lesions within the right kidney of various sizes measuring up to 2.8 cm, favored to represent cysts although not all are completely characterized. Left Kidney: Renal measurements: 9.9 x 4.6 x 4.5 cm = volume: 107.4 mL. Normal renal cortical thickness and echogenicity. No hydronephrosis. Multiple hypoechoic lesions within the left kidney, some of which represent cysts. Many are incompletely characterized. Bladder: Appears normal for degree of bladder distention. IMPRESSION: No hydronephrosis. Electronically Signed   By: Lovey Newcomer M.D.   On: 02/26/2019 14:56   Ct Chest High Resolution  Result Date: 02/27/2019 CLINICAL DATA:  68 year old female with history of acute on chronic hypoxic respiratory failure. COPD exacerbation. Evaluate for potential interstitial lung disease. EXAM: CT CHEST WITHOUT CONTRAST TECHNIQUE: Multidetector CT imaging of the chest was performed following the standard protocol without intravenous contrast. High resolution imaging of the lungs, as well as inspiratory and expiratory imaging, was performed. COMPARISON:  Chest CT 06/09/2017. FINDINGS: Cardiovascular: Heart size is mildly  enlarged. There is no significant pericardial fluid, thickening or pericardial calcification. There is aortic atherosclerosis, as well as atherosclerosis of the great vessels of the mediastinum and the coronary arteries, including calcified atherosclerotic plaque in the left main, left anterior descending, left circumflex and right coronary arteries. Calcifications of the aortic valve. Dilatation of the pulmonic trunk (3.7 cm in diameter). Mediastinum/Nodes: No pathologically enlarged mediastinal or hilar lymph nodes. Please note that accurate exclusion of hilar adenopathy is limited on noncontrast CT scans. Esophagus is unremarkable in appearance. No axillary lymphadenopathy. Lungs/Pleura: Diffuse bronchial wall thickening with moderate centrilobular and paraseptal emphysema. High-resolution images also demonstrate some ground-glass attenuation and mild interlobular septal thickening most evident throughout the mid to lower lungs bilaterally. No associated significant regions of traction bronchiectasis or honeycombing. Inspiratory and expiratory imaging demonstrates some mild air trapping, indicative of mild small airways disease. Upper Abdomen: Aortic atherosclerosis. Exophytic lesion in the upper pole of the left kidney measuring 2.1 cm in diameter, incompletely characterized on today's noncontrast CT examination, but stable in size and demonstrating new calcifications compared to prior study from 2018, potentially related to prior ablation. Musculoskeletal: There are no aggressive appearing lytic or blastic lesions noted in the visualized portions of the skeleton. IMPRESSION: 1. No definite findings to suggest interstitial lung disease. The ground-glass attenuation and interlobular septal thickening is favored to reflect a background of mild interstitial pulmonary edema in light of the patient's cardiomegaly, which could suggest mild congestive heart failure. If there is persistent clinical concern for  interstitial lung disease, repeat high-resolution chest CT should be obtained in 12 months to assess for temporal changes in the appearance of the lung parenchyma. 2. Diffuse bronchial wall thickening with moderate centrilobular and paraseptal emphysema; imaging findings compatible with the reported clinical history of underlying COPD. 3. Dilatation of the pulmonic trunk (3.7 cm in diameter), concerning for associated pulmonary arterial hypertension. 4. Aortic atherosclerosis, in addition to left main and 3 vessel coronary artery disease. Please note that although the presence of coronary artery calcium documents the presence of coronary artery disease, the severity of this disease and any potential stenosis cannot be assessed on this non-gated CT examination. Assessment for potential risk factor modification, dietary therapy or pharmacologic  therapy may be warranted, if clinically indicated. 5. There are calcifications of the aortic valve. Echocardiographic correlation for evaluation of potential valvular dysfunction may be warranted if clinically indicated. Aortic Atherosclerosis (ICD10-I70.0) and Emphysema (ICD10-J43.9). Electronically Signed   By: Vinnie Langton M.D.   On: 02/27/2019 08:16   Dg Chest Port 1 View  Result Date: 02/24/2019 CLINICAL DATA:  Shortness of breath for the past 4 days. EXAM: PORTABLE CHEST 1 VIEW COMPARISON:  01/24/2019. FINDINGS: Poor inspiration. No gross change in borderline enlargement of the cardiac silhouette. No Grace change in diffuse prominence of the interstitial markings with normal vasculature. No pleural fluid seen. Thoracic spine degenerative changes. IMPRESSION: No acute abnormality. Stable borderline cardiomegaly and moderate chronic interstitial lung disease when a decreased depth of inspiration is taken into account. Electronically Signed   By: Claudie Revering M.D.   On: 02/24/2019 12:18      The results of significant diagnostics from this hospitalization  (including imaging, microbiology, ancillary and laboratory) are listed below for reference.     Microbiology: No results found for this or any previous visit (from the past 240 hour(s)).   Labs: BNP (last 3 results) Recent Labs    01/25/19 0323 02/24/19 1150 02/26/19 0632  BNP 28.1 371.7* 633.3*   Basic Metabolic Panel: Recent Labs  Lab 03/03/19 0424 03/04/19 0415 03/05/19 0447 03/06/19 0525 03/07/19 0414 03/08/19 0404  NA 138 136 137 136 134* 135  K 3.7 3.0* 3.7 3.9 4.1 3.9  CL 91* 91* 93* 95* 91* 94*  CO2 35* 35* 32 _0 GLUCOSE 112* 107* 117* 95 305* 220*  BUN 40* 37* 37* 40* 41* 48*  CREATININE 2.12*  2.24* 2.10* 1.99* 2.44* 2.33* 2.57*  CALCIUM 9.7 9.4 9.6 9.6 9.5 9.4  MG 1.9 1.9  --  2.2 2.1 2.3  PHOS 4.2 3.8 3.9 4.0 4.2 4.4   Liver Function Tests: Recent Labs  Lab 03/04/19 0415 03/05/19 0447 03/06/19 0525 03/07/19 0414 03/08/19 0404  ALBUMIN 3.0* 3.1* 3.3* 3.2* 3.4*   No results for input(s): LIPASE, AMYLASE in the last 168 hours. No results for input(s): AMMONIA in the last 168 hours. CBC: Recent Labs  Lab 03/03/19 0424 03/04/19 0415  WBC 11.8* 11.6*  HGB 8.6* 8.9*  HCT 29.8* 30.2*  MCV 85.9 88.0  PLT 483* 386   Cardiac Enzymes: No results for input(s): CKTOTAL, CKMB, CKMBINDEX, TROPONINI in the last 168 hours. BNP: Invalid input(s): POCBNP CBG: Recent Labs  Lab 03/08/19 0748 03/08/19 1146 03/08/19 1820 03/08/19 2148 03/09/19 0740  GLUCAP 120* 197* 216* 252* 87   D-Dimer No results for input(s): DDIMER in the last 72 hours. Hgb A1c No results for input(s): HGBA1C in the last 72 hours. Lipid Profile No results for input(s): CHOL, HDL, LDLCALC, TRIG, CHOLHDL, LDLDIRECT in the last 72 hours. Thyroid function studies No results for input(s): TSH, T4TOTAL, T3FREE, THYROIDAB in the last 72 hours.  Invalid input(s): FREET3 Anemia work up No results for input(s): VITAMINB12, FOLATE, FERRITIN, TIBC, IRON, RETICCTPCT in the last  72 hours. Urinalysis    Component Value Date/Time   COLORURINE YELLOW 07/02/2017 1825   APPEARANCEUR CLEAR 07/02/2017 1825   LABSPEC 1.006 07/02/2017 1825   PHURINE 5.0 07/02/2017 1825   GLUCOSEU NEGATIVE 07/02/2017 1825   HGBUR NEGATIVE 07/02/2017 1825   BILIRUBINUR NEGATIVE 07/02/2017 1825   KETONESUR NEGATIVE 07/02/2017 1825   PROTEINUR 30 (A) 07/02/2017 1825   UROBILINOGEN 0.2 08/17/2015 0109   NITRITE NEGATIVE 07/02/2017 1825  LEUKOCYTESUR NEGATIVE 07/02/2017 1825   Sepsis Labs Invalid input(s): PROCALCITONIN,  WBC,  LACTICIDVEN Microbiology No results found for this or any previous visit (from the past 240 hour(s)).   Time coordinating discharge:  I have spent 35 minutes face to face with the patient and on the ward discussing the patients care, assessment, plan and disposition with other care givers. >50% of the time was devoted counseling the patient about the risks and benefits of treatment/Discharge disposition and coordinating care.   SIGNED:   Damita Lack, MD  [facility] 03/09/2019, 9:07 AM   If 7PM-7AM, please contact night-coverage [email or web address]

## 2019-03-09 NOTE — Progress Notes (Signed)
Pt seen by MD, orders written for discharge. RN went over discharge instructions with pt and answered all questions. RN removed IV's.  Pt transferred home with all belongings and discharge prescriptions via [facility]. Pt will follow up outpatient with MD. RN notified pt's niece (who she lives with) of discharge.

## 2019-03-09 NOTE — TOC Transition Note (Addendum)
Transition of Care [facility]) - CM/SW Discharge Note   Patient Details  Name: Elizabeth Thompson MRN: [id number] Date of Birth: Nov 06, 1951  [facility] CM/SW Contact:  Sharin Mons, RN Phone Number: 03/09/2019, 11:37 AM   Clinical Narrative:    Transition to home with home hospice care , [facility]. [facility] to provide transportation to home. Elizabeth Thompson ( niece) to receive pt.  Elizabeth Thompson (Niece)     [phone]        Barries to discharge: pt d/c to home with hospice care    Patient Goals and CMS Choice Patient states their goals for this hospitalization and ongoing recovery are:: to go home  with my neice with hospice care CMS Medicare.gov Compare Post Acute Care list provided to:: Patient Choice offered to / list presented to : Patient  Discharge Placement                       Discharge Plan and Services   Discharge Planning Services: CM Consult                  [facility] Agency: [facility]   Social Determinants of Health (SDOH) Interventions     Readmission Risk Interventions No flowsheet data found.

## 2019-03-14 ENCOUNTER — Ambulatory Visit: Payer: Medicare Other | Admitting: Podiatry

## 2019-03-23 ENCOUNTER — Telehealth: Payer: Self-pay | Admitting: Internal Medicine

## 2019-03-23 DIAGNOSIS — J449 Chronic obstructive pulmonary disease, unspecified: Secondary | ICD-10-CM

## 2019-03-23 DIAGNOSIS — J9611 Chronic respiratory failure with hypoxia: Secondary | ICD-10-CM

## 2019-03-23 NOTE — Telephone Encounter (Signed)
Order placed. [facility] to send order to DME company. Nothing further is needed at this time.

## 2019-03-23 NOTE — Telephone Encounter (Signed)
Ok

## 2019-03-23 NOTE — Telephone Encounter (Signed)
Returned phone call to [facility], spoke with Bethanne Ginger, she states they need new oxygen orders because patient she was d/c from hospice. The order needs to read 15L continous with Y connector and oximyzer, continuous with portable tanks and nebulizer.    MW please advise if we can send new order for oxygen and nebulizer due to D/C from hospice. Order has been pended. Thanks.

## 2019-03-27 ENCOUNTER — Encounter (HOSPITAL_COMMUNITY): Payer: Self-pay | Admitting: Emergency Medicine

## 2019-03-27 ENCOUNTER — Inpatient Hospital Stay (HOSPITAL_COMMUNITY)
Admission: EM | Admit: 2019-03-27 | Discharge: 2019-04-03 | DRG: 190 | Disposition: A | Discharge status: Home Health | Payer: Medicare Other | Attending: Internal Medicine | Admitting: Internal Medicine

## 2019-03-27 ENCOUNTER — Emergency Department (HOSPITAL_COMMUNITY): Payer: Medicare Other

## 2019-03-27 ENCOUNTER — Other Ambulatory Visit: Payer: Self-pay

## 2019-03-27 DIAGNOSIS — Z66 Do not resuscitate: Secondary | ICD-10-CM | POA: Diagnosis present

## 2019-03-27 DIAGNOSIS — Z79899 Other long term (current) drug therapy: Secondary | ICD-10-CM

## 2019-03-27 DIAGNOSIS — F319 Bipolar disorder, unspecified: Secondary | ICD-10-CM | POA: Diagnosis present

## 2019-03-27 DIAGNOSIS — E1122 Type 2 diabetes mellitus with diabetic chronic kidney disease: Secondary | ICD-10-CM | POA: Diagnosis present

## 2019-03-27 DIAGNOSIS — Z9981 Dependence on supplemental oxygen: Secondary | ICD-10-CM | POA: Diagnosis not present

## 2019-03-27 DIAGNOSIS — I5033 Acute on chronic diastolic (congestive) heart failure: Secondary | ICD-10-CM | POA: Diagnosis present

## 2019-03-27 DIAGNOSIS — Z820 Family history of epilepsy and other diseases of the nervous system: Secondary | ICD-10-CM

## 2019-03-27 DIAGNOSIS — I2721 Secondary pulmonary arterial hypertension: Secondary | ICD-10-CM | POA: Diagnosis present

## 2019-03-27 DIAGNOSIS — N184 Chronic kidney disease, stage 4 (severe): Secondary | ICD-10-CM | POA: Diagnosis present

## 2019-03-27 DIAGNOSIS — T380X5A Adverse effect of glucocorticoids and synthetic analogues, initial encounter: Secondary | ICD-10-CM | POA: Diagnosis present

## 2019-03-27 DIAGNOSIS — J9620 Acute and chronic respiratory failure, unspecified whether with hypoxia or hypercapnia: Secondary | ICD-10-CM | POA: Diagnosis present

## 2019-03-27 DIAGNOSIS — E119 Type 2 diabetes mellitus without complications: Secondary | ICD-10-CM

## 2019-03-27 DIAGNOSIS — I130 Hypertensive heart and chronic kidney disease with heart failure and stage 1 through stage 4 chronic kidney disease, or unspecified chronic kidney disease: Secondary | ICD-10-CM | POA: Diagnosis present

## 2019-03-27 DIAGNOSIS — J189 Pneumonia, unspecified organism: Secondary | ICD-10-CM | POA: Diagnosis present

## 2019-03-27 DIAGNOSIS — I959 Hypotension, unspecified: Secondary | ICD-10-CM | POA: Diagnosis not present

## 2019-03-27 DIAGNOSIS — I272 Pulmonary hypertension, unspecified: Secondary | ICD-10-CM | POA: Diagnosis not present

## 2019-03-27 DIAGNOSIS — J9611 Chronic respiratory failure with hypoxia: Secondary | ICD-10-CM

## 2019-03-27 DIAGNOSIS — Z85528 Personal history of other malignant neoplasm of kidney: Secondary | ICD-10-CM

## 2019-03-27 DIAGNOSIS — J441 Chronic obstructive pulmonary disease with (acute) exacerbation: Secondary | ICD-10-CM | POA: Diagnosis present

## 2019-03-27 DIAGNOSIS — Z6834 Body mass index (BMI) 34.0-34.9, adult: Secondary | ICD-10-CM | POA: Diagnosis not present

## 2019-03-27 DIAGNOSIS — J9612 Chronic respiratory failure with hypercapnia: Secondary | ICD-10-CM

## 2019-03-27 DIAGNOSIS — D638 Anemia in other chronic diseases classified elsewhere: Secondary | ICD-10-CM | POA: Diagnosis present

## 2019-03-27 DIAGNOSIS — E1165 Type 2 diabetes mellitus with hyperglycemia: Secondary | ICD-10-CM | POA: Diagnosis present

## 2019-03-27 DIAGNOSIS — I2781 Cor pulmonale (chronic): Secondary | ICD-10-CM | POA: Diagnosis present

## 2019-03-27 DIAGNOSIS — E876 Hypokalemia: Secondary | ICD-10-CM | POA: Diagnosis present

## 2019-03-27 DIAGNOSIS — R0902 Hypoxemia: Secondary | ICD-10-CM | POA: Diagnosis not present

## 2019-03-27 DIAGNOSIS — F0390 Unspecified dementia without behavioral disturbance: Secondary | ICD-10-CM | POA: Diagnosis present

## 2019-03-27 DIAGNOSIS — Z885 Allergy status to narcotic agent status: Secondary | ICD-10-CM

## 2019-03-27 DIAGNOSIS — E669 Obesity, unspecified: Secondary | ICD-10-CM | POA: Diagnosis present

## 2019-03-27 DIAGNOSIS — J9622 Acute and chronic respiratory failure with hypercapnia: Secondary | ICD-10-CM | POA: Diagnosis present

## 2019-03-27 DIAGNOSIS — Z20828 Contact with and (suspected) exposure to other viral communicable diseases: Secondary | ICD-10-CM | POA: Diagnosis present

## 2019-03-27 DIAGNOSIS — J9621 Acute and chronic respiratory failure with hypoxia: Secondary | ICD-10-CM | POA: Diagnosis present

## 2019-03-27 DIAGNOSIS — J9601 Acute respiratory failure with hypoxia: Secondary | ICD-10-CM

## 2019-03-27 DIAGNOSIS — H409 Unspecified glaucoma: Secondary | ICD-10-CM | POA: Diagnosis present

## 2019-03-27 DIAGNOSIS — R627 Adult failure to thrive: Secondary | ICD-10-CM

## 2019-03-27 DIAGNOSIS — Z515 Encounter for palliative care: Secondary | ICD-10-CM | POA: Diagnosis not present

## 2019-03-27 DIAGNOSIS — Z7984 Long term (current) use of oral hypoglycemic drugs: Secondary | ICD-10-CM

## 2019-03-27 DIAGNOSIS — Z7189 Other specified counseling: Secondary | ICD-10-CM | POA: Diagnosis not present

## 2019-03-27 DIAGNOSIS — Z8249 Family history of ischemic heart disease and other diseases of the circulatory system: Secondary | ICD-10-CM

## 2019-03-27 DIAGNOSIS — Z888 Allergy status to other drugs, medicaments and biological substances status: Secondary | ICD-10-CM

## 2019-03-27 DIAGNOSIS — K219 Gastro-esophageal reflux disease without esophagitis: Secondary | ICD-10-CM | POA: Diagnosis present

## 2019-03-27 DIAGNOSIS — Z87891 Personal history of nicotine dependence: Secondary | ICD-10-CM

## 2019-03-27 DIAGNOSIS — Z833 Family history of diabetes mellitus: Secondary | ICD-10-CM

## 2019-03-27 HISTORY — DX: Acute respiratory distress: R06.03

## 2019-03-27 LAB — POCT I-STAT 7, (LYTES, BLD GAS, ICA,H+H)
Acid-Base Excess: 2 mmol/L (ref 0.0–2.0)
Bicarbonate: 27.1 mmol/L (ref 20.0–28.0)
Calcium, Ion: 1.21 mmol/L (ref 1.15–1.40)
HCT: 37 % (ref 36.0–46.0)
Hemoglobin: 12.6 g/dL (ref 12.0–15.0)
O2 Saturation: 38 %
Potassium: 3.4 mmol/L — ABNORMAL LOW (ref 3.5–5.1)
Sodium: 138 mmol/L (ref 135–145)
TCO2: 28 mmol/L (ref 22–32)
pCO2 arterial: 43.8 mmHg (ref 32.0–48.0)
pH, Arterial: 7.399 (ref 7.350–7.450)
pO2, Arterial: 22 mmHg — CL (ref 83.0–108.0)

## 2019-03-27 LAB — CBC WITH DIFFERENTIAL/PLATELET
Abs Immature Granulocytes: 0.04 10*3/uL (ref 0.00–0.07)
Basophils Absolute: 0 10*3/uL (ref 0.0–0.1)
Basophils Relative: 1 %
Eosinophils Absolute: 0.1 10*3/uL (ref 0.0–0.5)
Eosinophils Relative: 1 %
HCT: 40.2 % (ref 36.0–46.0)
Hemoglobin: 11.9 g/dL — ABNORMAL LOW (ref 12.0–15.0)
Immature Granulocytes: 1 %
Lymphocytes Relative: 13 %
Lymphs Abs: 1 10*3/uL (ref 0.7–4.0)
MCH: 27.4 pg (ref 26.0–34.0)
MCHC: 29.6 g/dL — ABNORMAL LOW (ref 30.0–36.0)
MCV: 92.4 fL (ref 80.0–100.0)
Monocytes Absolute: 0.3 10*3/uL (ref 0.1–1.0)
Monocytes Relative: 4 %
Neutro Abs: 6.2 10*3/uL (ref 1.7–7.7)
Neutrophils Relative %: 80 %
Platelets: 425 10*3/uL — ABNORMAL HIGH (ref 150–400)
RBC: 4.35 MIL/uL (ref 3.87–5.11)
RDW: 18.8 % — ABNORMAL HIGH (ref 11.5–15.5)
WBC: 7.7 10*3/uL (ref 4.0–10.5)
nRBC: 0 % (ref 0.0–0.2)

## 2019-03-27 LAB — URINALYSIS, COMPLETE (UACMP) WITH MICROSCOPIC
Bilirubin Urine: NEGATIVE
Glucose, UA: NEGATIVE mg/dL
Hgb urine dipstick: NEGATIVE
Ketones, ur: NEGATIVE mg/dL
Leukocytes,Ua: NEGATIVE
Nitrite: NEGATIVE
Protein, ur: 30 mg/dL — AB
Specific Gravity, Urine: 1.013 (ref 1.005–1.030)
pH: 5 (ref 5.0–8.0)

## 2019-03-27 LAB — COMPREHENSIVE METABOLIC PANEL
ALT: 12 U/L (ref 0–44)
AST: 20 U/L (ref 15–41)
Albumin: 3.9 g/dL (ref 3.5–5.0)
Alkaline Phosphatase: 64 U/L (ref 38–126)
Anion gap: 16 — ABNORMAL HIGH (ref 5–15)
BUN: 22 mg/dL (ref 8–23)
CO2: 23 mmol/L (ref 22–32)
Calcium: 9.5 mg/dL (ref 8.9–10.3)
Chloride: 100 mmol/L (ref 98–111)
Creatinine, Ser: 1.99 mg/dL — ABNORMAL HIGH (ref 0.44–1.00)
GFR calc Af Amer: 29 mL/min — ABNORMAL LOW (ref >60)
GFR calc non Af Amer: 25 mL/min — ABNORMAL LOW (ref >60)
Glucose, Bld: 150 mg/dL — ABNORMAL HIGH (ref 70–99)
Potassium: 3.3 mmol/L — ABNORMAL LOW (ref 3.5–5.1)
Sodium: 139 mmol/L (ref 135–145)
Total Bilirubin: 0.6 mg/dL (ref 0.3–1.2)
Total Protein: 7 g/dL (ref 6.5–8.1)

## 2019-03-27 LAB — TYPE AND SCREEN
ABO/RH(D): O POS
Antibody Screen: NEGATIVE

## 2019-03-27 LAB — LACTIC ACID, PLASMA: Lactic Acid, Venous: 1.7 mmol/L (ref 0.5–1.9)

## 2019-03-27 LAB — PROCALCITONIN: Procalcitonin: 0.16 ng/mL

## 2019-03-27 LAB — BRAIN NATRIURETIC PEPTIDE: B Natriuretic Peptide: 593.2 pg/mL — ABNORMAL HIGH (ref 0.0–100.0)

## 2019-03-27 LAB — TROPONIN I: Troponin I: 0.03 ng/mL (ref <0.03)

## 2019-03-27 LAB — SARS CORONAVIRUS 2 BY RT PCR (HOSPITAL ORDER, PERFORMED IN ~~LOC~~ HOSPITAL LAB): SARS Coronavirus 2: NEGATIVE

## 2019-03-27 LAB — CBG MONITORING, ED: Glucose-Capillary: 134 mg/dL — ABNORMAL HIGH (ref 70–99)

## 2019-03-27 LAB — MAGNESIUM: Magnesium: 2.1 mg/dL (ref 1.7–2.4)

## 2019-03-27 LAB — PROTIME-INR
INR: 0.9 (ref 0.8–1.2)
Prothrombin Time: 12.5 seconds (ref 11.4–15.2)

## 2019-03-27 LAB — PHOSPHORUS: Phosphorus: 3.8 mg/dL (ref 2.5–4.6)

## 2019-03-27 MED ORDER — ALBUTEROL SULFATE HFA 108 (90 BASE) MCG/ACT IN AERS
4.0000 | INHALATION_SPRAY | Freq: Once | RESPIRATORY_TRACT | Status: AC
  Administered 2019-03-27: 4 via RESPIRATORY_TRACT
  Filled 2019-03-27: qty 6.7

## 2019-03-27 MED ORDER — IPRATROPIUM-ALBUTEROL 0.5-2.5 (3) MG/3ML IN SOLN
3.0000 mL | Freq: Four times a day (QID) | RESPIRATORY_TRACT | Status: DC
  Administered 2019-03-28 – 2019-03-31 (×16): 3 mL via RESPIRATORY_TRACT
  Filled 2019-03-27 (×18): qty 3

## 2019-03-27 MED ORDER — SILDENAFIL CITRATE 20 MG PO TABS
20.0000 mg | ORAL_TABLET | Freq: Three times a day (TID) | ORAL | Status: DC
  Administered 2019-03-28 – 2019-04-03 (×17): 20 mg via ORAL
  Filled 2019-03-27 (×18): qty 1

## 2019-03-27 MED ORDER — BUDESONIDE 0.25 MG/2ML IN SUSP
0.2500 mg | Freq: Two times a day (BID) | RESPIRATORY_TRACT | Status: DC
  Administered 2019-03-28 – 2019-04-03 (×14): 0.25 mg via RESPIRATORY_TRACT
  Filled 2019-03-27 (×16): qty 2

## 2019-03-27 MED ORDER — FUROSEMIDE 80 MG PO TABS
80.0000 mg | ORAL_TABLET | Freq: Every day | ORAL | Status: DC
  Administered 2019-03-27 – 2019-03-28 (×2): 80 mg via ORAL
  Filled 2019-03-27: qty 1
  Filled 2019-03-27: qty 4

## 2019-03-27 MED ORDER — POTASSIUM CHLORIDE CRYS ER 20 MEQ PO TBCR
20.0000 meq | EXTENDED_RELEASE_TABLET | Freq: Once | ORAL | Status: AC
  Administered 2019-03-27: 20 meq via ORAL
  Filled 2019-03-27: qty 1

## 2019-03-27 MED ORDER — METHYLPREDNISOLONE SODIUM SUCC 125 MG IJ SOLR
125.0000 mg | Freq: Once | INTRAMUSCULAR | Status: AC
  Administered 2019-03-27: 125 mg via INTRAVENOUS
  Filled 2019-03-27: qty 2

## 2019-03-27 MED ORDER — INSULIN ASPART 100 UNIT/ML ~~LOC~~ SOLN
0.0000 [IU] | Freq: Every day | SUBCUTANEOUS | Status: DC
  Administered 2019-03-31: 2 [IU] via SUBCUTANEOUS
  Administered 2019-04-01: 3 [IU] via SUBCUTANEOUS

## 2019-03-27 MED ORDER — SODIUM CHLORIDE 0.9 % IV SOLN
1.0000 g | INTRAVENOUS | Status: DC
  Administered 2019-03-28 – 2019-03-29 (×2): 1 g via INTRAVENOUS
  Filled 2019-03-27 (×3): qty 1

## 2019-03-27 MED ORDER — METHYLPREDNISOLONE SODIUM SUCC 40 MG IJ SOLR
40.0000 mg | Freq: Four times a day (QID) | INTRAMUSCULAR | Status: DC
  Administered 2019-03-27 – 2019-03-30 (×11): 40 mg via INTRAVENOUS
  Filled 2019-03-27 (×11): qty 1

## 2019-03-27 MED ORDER — POLYETHYLENE GLYCOL 3350 17 G PO PACK: 17.0000 g | PACK | Freq: Every day | ORAL | Status: DC | PRN

## 2019-03-27 MED ORDER — DORZOLAMIDE HCL-TIMOLOL MAL 2-0.5 % OP SOLN
1.0000 [drp] | Freq: Two times a day (BID) | OPHTHALMIC | Status: DC
  Administered 2019-03-28 – 2019-04-03 (×13): 1 [drp] via OPHTHALMIC
  Filled 2019-03-27: qty 10

## 2019-03-27 MED ORDER — LAMOTRIGINE 100 MG PO TABS
100.0000 mg | ORAL_TABLET | Freq: Every day | ORAL | Status: DC
  Administered 2019-03-30 – 2019-04-03 (×5): 100 mg via ORAL
  Filled 2019-03-27 (×5): qty 1

## 2019-03-27 MED ORDER — HEPARIN SODIUM (PORCINE) 5000 UNIT/ML IJ SOLN
5000.0000 [IU] | Freq: Three times a day (TID) | INTRAMUSCULAR | Status: DC
  Administered 2019-03-27 – 2019-04-03 (×20): 5000 [IU] via SUBCUTANEOUS
  Filled 2019-03-27 (×20): qty 1

## 2019-03-27 MED ORDER — BRIMONIDINE TARTRATE 0.2 % OP SOLN
1.0000 [drp] | Freq: Two times a day (BID) | OPHTHALMIC | Status: DC
  Administered 2019-03-28 – 2019-04-03 (×13): 1 [drp] via OPHTHALMIC
  Filled 2019-03-27: qty 5

## 2019-03-27 MED ORDER — FERROUS SULFATE 325 (65 FE) MG PO TABS
325.0000 mg | ORAL_TABLET | Freq: Every day | ORAL | Status: DC
  Administered 2019-03-30 – 2019-04-03 (×5): 325 mg via ORAL
  Filled 2019-03-27 (×5): qty 1

## 2019-03-27 MED ORDER — ATORVASTATIN CALCIUM 10 MG PO TABS
20.0000 mg | ORAL_TABLET | Freq: Every day | ORAL | Status: DC
  Administered 2019-03-30 – 2019-04-03 (×5): 20 mg via ORAL
  Filled 2019-03-27 (×5): qty 2

## 2019-03-27 MED ORDER — SENNA 8.6 MG PO TABS: 1.0000 | ORAL_TABLET | Freq: Every day | ORAL | Status: DC | PRN

## 2019-03-27 MED ORDER — SODIUM CHLORIDE 0.9 % IV SOLN
2.0000 g | Freq: Once | INTRAVENOUS | Status: AC
  Administered 2019-03-27: 16:00:00 2 g via INTRAVENOUS
  Filled 2019-03-27: qty 2

## 2019-03-27 MED ORDER — ARIPIPRAZOLE 5 MG PO TABS
20.0000 mg | ORAL_TABLET | Freq: Every day | ORAL | Status: DC
  Administered 2019-03-27 – 2019-04-02 (×6): 20 mg via ORAL
  Filled 2019-03-27 (×4): qty 4
  Filled 2019-03-27: qty 2
  Filled 2019-03-27: qty 4

## 2019-03-27 MED ORDER — IPRATROPIUM-ALBUTEROL 0.5-2.5 (3) MG/3ML IN SOLN: 3.0000 mL | Freq: Four times a day (QID) | RESPIRATORY_TRACT | Status: DC | PRN

## 2019-03-27 MED ORDER — ASPIRIN EC 81 MG PO TBEC
81.0000 mg | DELAYED_RELEASE_TABLET | Freq: Every day | ORAL | Status: DC
  Administered 2019-03-28 – 2019-04-03 (×5): 81 mg via ORAL
  Filled 2019-03-27 (×6): qty 1

## 2019-03-27 MED ORDER — VANCOMYCIN HCL IN DEXTROSE 1-5 GM/200ML-% IV SOLN: 1000.0000 mg | Freq: Once | INTRAVENOUS | Status: DC

## 2019-03-27 MED ORDER — BISACODYL 10 MG RE SUPP: 10.0000 mg | Freq: Every day | RECTAL | Status: DC | PRN

## 2019-03-27 MED ORDER — INSULIN ASPART 100 UNIT/ML ~~LOC~~ SOLN
0.0000 [IU] | Freq: Three times a day (TID) | SUBCUTANEOUS | Status: DC
  Administered 2019-03-28 (×2): 1 [IU] via SUBCUTANEOUS
  Administered 2019-03-29: 2 [IU] via SUBCUTANEOUS
  Administered 2019-03-29 (×2): 1 [IU] via SUBCUTANEOUS
  Administered 2019-03-30: 5 [IU] via SUBCUTANEOUS
  Administered 2019-03-30 (×2): 1 [IU] via SUBCUTANEOUS
  Administered 2019-03-31 (×2): 2 [IU] via SUBCUTANEOUS
  Administered 2019-04-01: 1 [IU] via SUBCUTANEOUS
  Administered 2019-04-01: 2 [IU] via SUBCUTANEOUS
  Administered 2019-04-02: 5 [IU] via SUBCUTANEOUS
  Administered 2019-04-02: 2 [IU] via SUBCUTANEOUS
  Administered 2019-04-03: 1 [IU] via SUBCUTANEOUS
  Administered 2019-04-03 (×2): 7 [IU] via SUBCUTANEOUS

## 2019-03-27 MED ORDER — DONEPEZIL HCL 5 MG PO TABS
5.0000 mg | ORAL_TABLET | Freq: Every day | ORAL | Status: DC
  Administered 2019-03-27 – 2019-04-02 (×6): 5 mg via ORAL
  Filled 2019-03-27 (×6): qty 1

## 2019-03-27 MED ORDER — LATANOPROST 0.005 % OP SOLN
1.0000 [drp] | Freq: Every day | OPHTHALMIC | Status: DC
  Administered 2019-03-28 – 2019-04-02 (×6): 1 [drp] via OPHTHALMIC
  Filled 2019-03-27: qty 2.5

## 2019-03-27 MED ORDER — VANCOMYCIN HCL 10 G IV SOLR
1500.0000 mg | Freq: Once | INTRAVENOUS | Status: AC
  Administered 2019-03-27: 1500 mg via INTRAVENOUS
  Filled 2019-03-27: qty 1500

## 2019-03-27 NOTE — ED Notes (Signed)
Pharmacy messaged about unverified meds

## 2019-03-27 NOTE — ED Notes (Addendum)
ED TO INPATIENT HANDOFF REPORT  ED Nurse Name and Phone #: [phone] Jeanine Luz Name/Age/Gender Elizabeth Thompson 68 y.o. female Room/Bed: 023C/023C  Code Status   Code Status: DNR  Home/SNF/Other Home Patient oriented to: self, place, time and situation Is this baseline? Yes   Triage Complete: Triage complete  Chief Complaint SOB  Triage Note Pt here from home with c/o low sats and sob , sats in the 96 and 50's on 14 Liters ,pt placed a NRB by ems sats up to 94 's  Pt temp 99.5 with ems    Allergies Allergies  Allergen Reactions  . Codeine Nausea And Vomiting  . Nitrostat [Nitroglycerin] Other (See Comments)    Pt is on revatio  . Prednisone Other (See Comments)    Has to monitor because she is a diabetic     Level of Care/Admitting Diagnosis ED Disposition    ED Disposition Condition [place]: [place] [100100]  Level of Care: Progressive [102]  Diagnosis: Acute on chronic respiratory failure [facility] [[id number]]  Admitting Physician: Shirlean Mylar  Attending Physician: Dana Allan I [[id number]]  Estimated length of stay: past midnight tomorrow  Certification:: I certify this patient will need inpatient services for at least 2 midnights  PT Class (Do Not Modify): Inpatient [101]  PT Acc Code (Do Not Modify): Private [1]       B Medical/Surgery History Past Medical History:  Diagnosis Date  . Acute respiratory failure ([place]) 06/09/2017  . Arthritis   . Back pain   . Bell's palsy   . Bipolar disorder ([place])   . Bronchitis   . Chronic low back pain 05/09/2015  . Cough with expectoration 05/21/16   recent diagnosis of bronchitis  . Cyst of right kidney   . Diabetes mellitus without complication ([place])    type 2  . Frequency of urination   . GERD (gastroesophageal reflux disease)   . Glaucoma   . History of blood transfusion   . History of colon polyps   . History of hiatal hernia   . History of tobacco  use   . Hypercalcemia   . Hyperlipidemia   . Hypertension   . Memory disorder 09/05/2014  . On home oxygen therapy    3L/M [place] at night   . Schizo-affective psychosis ([place])   . Shortness of breath dyspnea    exertion, or with out oxygen  . Uterine fibroid    Past Surgical History:  Procedure Laterality Date  . ABDOMINAL HYSTERECTOMY    . APPENDECTOMY    . COLONOSCOPY W/ POLYPECTOMY    . IR GENERIC HISTORICAL  04/29/2016   IR RADIOLOGIST EVAL & MGMT 04/29/2016 Corrie Mckusick, DO [facility] INTERV RAD  . IR GENERIC HISTORICAL  10/01/2016   IR RADIOLOGIST EVAL & MGMT 10/01/2016 [facility] INTERV RAD  . IR GENERIC HISTORICAL  11/11/2016   IR RADIOLOGIST EVAL & MGMT 11/11/2016 Corrie Mckusick, DO [facility] INTERV RAD  . IR GENERIC HISTORICAL  07/16/2016   IR RADIOLOGIST EVAL & MGMT 07/16/2016 Corrie Mckusick, DO [facility] INTERV RAD  . IR RADIOLOGIST EVAL & MGMT  03/10/2017  . IR RADIOLOGIST EVAL & MGMT  05/13/2017  . IR RADIOLOGIST EVAL & MGMT  04/27/2018  . LUMBAR LAMINECTOMY/DECOMPRESSION MICRODISCECTOMY N/A 08/14/2015   Procedure: LUMBAR DECOMPRESSION MICRODISCECTOMY L3-S1;  Surgeon: Melina Schools, MD;  Location: [place];  Service: Orthopedics;  Laterality: N/A;  . RADIOFREQUENCY ABLATION Left 02/05/2017   Procedure: LEFT  RENAL CRYOABLATION;  Surgeon: Corrie Mckusick, DO;  Location: [facility];  Service: Anesthesiology;  Laterality: Left;  . RIGHT HEART CATH N/A 07/20/2017   Procedure: Right Heart Cath;  Surgeon: Nigel Mormon, MD;  Location: [place] CV LAB;  Service: Cardiovascular;  Laterality: N/A;  . RIGHT/LEFT HEART CATH AND CORONARY ANGIOGRAPHY N/A 06/11/2017   Procedure: Right/Left Heart Cath and Coronary Angiography;  Surgeon: Dixie Dials, MD;  Location: [place] CV LAB;  Service: Cardiovascular;  Laterality: N/A;  . TONSILLECTOMY AND ADENOIDECTOMY       A IV Location/Drains/Wounds Patient Lines/Drains/Airways Status   Active Line/Drains/Airways    Name:   Placement date:   Placement time:   Site:    Days:   Peripheral IV 03/27/19 Right Forearm   03/27/19    -    Forearm   less than 1   Peripheral IV 03/27/19 Left;Upper Forearm   03/27/19    1553    Forearm   less than 1   External Urinary Catheter   03/27/19    1740    -   less than 1          Intake/Output Last 24 hours  Intake/Output Summary (Last 24 hours) at 03/27/2019 2210 Last data filed at 03/27/2019 2033 Gross per 24 hour  Intake 600 ml  Output -  Net 600 ml    Labs/Imaging Results for orders placed or performed during the hospital encounter of 03/27/19 (from the past 48 hour(s))  I-STAT 7, (LYTES, BLD GAS, ICA, H+H)     Status: Abnormal   Collection Time: 03/27/19  3:24 PM  Result Value Ref Range   pH, Arterial 7.399 7.350 - 7.450   pCO2 arterial 43.8 32.0 - 48.0 mmHg   pO2, Arterial 22.0 (LL) 83.0 - 108.0 mmHg   Bicarbonate 27.1 20.0 - 28.0 mmol/L   TCO2 28 22 - 32 mmol/L   O2 Saturation 38.0 %   Acid-Base Excess 2.0 0.0 - 2.0 mmol/L   Sodium 138 135 - 145 mmol/L   Potassium 3.4 (L) 3.5 - 5.1 mmol/L   Calcium, Ion 1.21 1.15 - 1.40 mmol/L   HCT 37.0 36.0 - 46.0 %   Hemoglobin 12.6 12.0 - 15.0 g/dL   Patient temperature HIDE    Sample type ARTERIAL    Comment NOTIFIED PHYSICIAN   Lactic acid, plasma     Status: None   Collection Time: 03/27/19  3:31 PM  Result Value Ref Range   Lactic Acid, Venous 1.7 0.5 - 1.9 mmol/L    Comment: Performed at [facility], [address], [place], [place] [address]  Comprehensive metabolic panel     Status: Abnormal   Collection Time: 03/27/19  3:31 PM  Result Value Ref Range   Sodium 139 135 - 145 mmol/L   Potassium 3.3 (L) 3.5 - 5.1 mmol/L   Chloride 100 98 - 111 mmol/L   CO2 23 22 - 32 mmol/L   Glucose, Bld 150 (H) 70 - 99 mg/dL   BUN 22 8 - 23 mg/dL   Creatinine, Ser 1.99 (H) 0.44 - 1.00 mg/dL   Calcium 9.5 8.9 - 10.3 mg/dL   Total Protein 7.0 6.5 - 8.1 g/dL   Albumin 3.9 3.5 - 5.0 g/dL   AST 20 15 - 41 U/L   ALT 12 0 - 44 U/L   Alkaline Phosphatase 64  38 - 126 U/L   Total Bilirubin 0.6 0.3 - 1.2 mg/dL   GFR calc non Af  Amer 25 (L) >60 mL/min   GFR calc Af Amer 29 (L) >60 mL/min   Anion gap 16 (H) 5 - 15    Comment: Performed at [facility] [address] [place], [place] [address]  CBC WITH DIFFERENTIAL     Status: Abnormal   Collection Time: 03/27/19  3:31 PM  Result Value Ref Range   WBC 7.7 4.0 - 10.5 K/uL   RBC 4.35 3.87 - 5.11 MIL/uL   Hemoglobin 11.9 (L) 12.0 - 15.0 g/dL   HCT 40.2 36.0 - 46.0 %   MCV 92.4 80.0 - 100.0 fL   MCH 27.4 26.0 - 34.0 pg   MCHC 29.6 (L) 30.0 - 36.0 g/dL   RDW 18.8 (H) 11.5 - 15.5 %   Platelets 425 (H) 150 - 400 K/uL   nRBC 0.0 0.0 - 0.2 %   Neutrophils Relative % 80 %   Neutro Abs 6.2 1.7 - 7.7 K/uL   Lymphocytes Relative 13 %   Lymphs Abs 1.0 0.7 - 4.0 K/uL   Monocytes Relative 4 %   Monocytes Absolute 0.3 0.1 - 1.0 K/uL   Eosinophils Relative 1 %   Eosinophils Absolute 0.1 0.0 - 0.5 K/uL   Basophils Relative 1 %   Basophils Absolute 0.0 0.0 - 0.1 K/uL   Immature Granulocytes 1 %   Abs Immature Granulocytes 0.04 0.00 - 0.07 K/uL    Comment: Performed at [facility] [address] [place], [place] [address]  Brain natriuretic peptide     Status: Abnormal   Collection Time: 03/27/19  3:31 PM  Result Value Ref Range   B Natriuretic Peptide 593.2 (H) 0.0 - 100.0 pg/mL    Comment: Performed at [facility] [address] [place], [place] [address]  Procalcitonin     Status: None   Collection Time: 03/27/19  3:31 PM  Result Value Ref Range   Procalcitonin 0.16 ng/mL    Comment:        Interpretation: PCT (Procalcitonin) <= 0.5 ng/mL: Systemic infection (sepsis) is not likely. Local bacterial infection is possible. (NOTE)       Sepsis PCT Algorithm           Lower Respiratory Tract                                      Infection PCT Algorithm    ----------------------------     ----------------------------         PCT < 0.25 ng/mL                PCT < 0.10  ng/mL         Strongly encourage             Strongly discourage   discontinuation of antibiotics    initiation of antibiotics    ----------------------------     -----------------------------       PCT 0.25 - 0.50 ng/mL            PCT 0.10 - 0.25 ng/mL               OR       >80% decrease in PCT            Discourage initiation of  antibiotics      Encourage discontinuation           of antibiotics    ----------------------------     -----------------------------         PCT >= 0.50 ng/mL              PCT 0.26 - 0.50 ng/mL               AND        <80% decrease in PCT             Encourage initiation of                                             antibiotics       Encourage continuation           of antibiotics    ----------------------------     -----------------------------        PCT >= 0.50 ng/mL                  PCT > 0.50 ng/mL               AND         increase in PCT                  Strongly encourage                                      initiation of antibiotics    Strongly encourage escalation           of antibiotics                                     -----------------------------                                           PCT <= 0.25 ng/mL                                                 OR                                        > 80% decrease in PCT                                     Discontinue / Do not initiate                                             antibiotics Performed at [facility], [place] [address], [place], [place] [address]   Protime-INR     Status: None   Collection Time: 03/27/19  3:31  PM  Result Value Ref Range   Prothrombin Time 12.5 11.4 - 15.2 seconds   INR 0.9 0.8 - 1.2    Comment: (NOTE) INR goal varies based on device and disease states. Performed at [facility], [place] [address], [place], [place] [address]   Type and screen [place]     Status: None   Collection  Time: 03/27/19  3:38 PM  Result Value Ref Range   ABO/RH(D) O POS    Antibody Screen NEG    Sample Expiration      03/30/2019 Performed at [name], [place] [address], [place], [place] [address]   SARS Coronavirus 2 [facility] order, Performed in [facility] hospital lab)     Status: None   Collection Time: 03/27/19  3:46 PM  Result Value Ref Range   SARS Coronavirus 2 NEGATIVE NEGATIVE    Comment: (NOTE) If result is NEGATIVE SARS-CoV-2 target nucleic acids are NOT DETECTED. The SARS-CoV-2 RNA is generally detectable in upper and lower  respiratory specimens during the acute phase of infection. The lowest  concentration of SARS-CoV-2 viral copies this assay can detect is 250  copies / mL. A negative result does not preclude SARS-CoV-2 infection  and should not be used as the sole basis for treatment or other  patient management decisions.  A negative result may occur with  improper specimen collection / handling, submission of specimen other  than nasopharyngeal swab, presence of viral mutation(s) within the  areas targeted by this assay, and inadequate number of viral copies  (<250 copies / mL). A negative result must be combined with clinical  observations, patient history, and epidemiological information. If result is POSITIVE SARS-CoV-2 target nucleic acids are DETECTED. The SARS-CoV-2 RNA is generally detectable in upper and lower  respiratory specimens dur ing the acute phase of infection.  Positive  results are indicative of active infection with SARS-CoV-2.  Clinical  correlation with patient history and other diagnostic information is  necessary to determine patient infection status.  Positive results do  not rule out bacterial infection or co-infection with other viruses. If result is PRESUMPTIVE POSTIVE SARS-CoV-2 nucleic acids MAY BE PRESENT.   A presumptive positive result was obtained on the submitted specimen  and confirmed on repeat testing.  While 2019  novel coronavirus  (SARS-CoV-2) nucleic acids may be present in the submitted sample  additional confirmatory testing may be necessary for epidemiological  and / or clinical management purposes  to differentiate between  SARS-CoV-2 and other Sarbecovirus currently known to infect humans.  If clinically indicated additional testing with an alternate test  methodology [phone]) is advised. The SARS-CoV-2 RNA is generally  detectable in upper and lower respiratory sp ecimens during the acute  phase of infection. The expected result is Negative. Fact Sheet for Patients:  StrictlyIdeas.no Fact Sheet for Healthcare Providers: BankingDealers.co.za This test is not yet approved or cleared by the [place] FDA and has been authorized for detection and/or diagnosis of SARS-CoV-2 by FDA under an Emergency Use Authorization (EUA).  This EUA will remain in effect (meaning this test can be used) for the duration of the COVID-19 declaration under Section 564(b)(1) of the Act, 21 U.S.C. section 360bbb-3(b)(1), unless the authorization is terminated or revoked sooner. Performed at [facility], [place] [address] [place], [place] [address]   Troponin I - ONCE - STAT     Status: Abnormal   Collection Time: 03/27/19  3:50 PM  Result  Value Ref Range   Troponin I 0.03 (HH) <0.03 ng/mL    Comment: CRITICAL RESULT CALLED TO, READ BACK BY AND VERIFIED WITH: Lonia Farber [id number] 03/27/2019 WBOND Performed at [facility], [place] [address] [place], [place] [address]   Urinalysis, Complete w Microscopic     Status: Abnormal   Collection Time: 03/27/19  4:18 PM  Result Value Ref Range   Color, Urine YELLOW YELLOW   APPearance CLEAR CLEAR   Specific Gravity, Urine 1.013 1.005 - 1.030   pH 5.0 5.0 - 8.0   Glucose, UA NEGATIVE NEGATIVE mg/dL   Hgb urine dipstick NEGATIVE NEGATIVE   Bilirubin Urine NEGATIVE NEGATIVE   Ketones, ur NEGATIVE NEGATIVE mg/dL    Protein, ur 30 (A) NEGATIVE mg/dL   Nitrite NEGATIVE NEGATIVE   Leukocytes,Ua NEGATIVE NEGATIVE   RBC / HPF 0-5 0 - 5 RBC/hpf   WBC, UA 0-5 0 - 5 WBC/hpf   Bacteria, UA RARE (A) NONE SEEN   Squamous Epithelial / LPF 0-5 0 - 5   Mucus PRESENT    Hyaline Casts, UA PRESENT     Comment: Performed at [facility], [address], [place], [place] [address]  Magnesium     Status: None   Collection Time: 03/27/19  8:21 PM  Result Value Ref Range   Magnesium 2.1 1.7 - 2.4 mg/dL    Comment: Performed at [facility] [address], [place], [place] [address]  Phosphorus     Status: None   Collection Time: 03/27/19  8:21 PM  Result Value Ref Range   Phosphorus 3.8 2.5 - 4.6 mg/dL    Comment: Performed at [facility] [address] [place], [place] [address]  CBG monitoring, ED     Status: Abnormal   Collection Time: 03/27/19  9:29 PM  Result Value Ref Range   Glucose-Capillary 134 (H) 70 - 99 mg/dL   Dg Chest Port 1 View  Result Date: 03/27/2019 CLINICAL DATA:  68 year old female with hypoxia EXAM: PORTABLE CHEST 1 VIEW COMPARISON:  Prior chest x-ray and CT scan of the chest 02/25/2019 FINDINGS: Stable borderline cardiomegaly. Mediastinal contours are unchanged. Slightly increased pulmonary vascular congestion and nonspecific bibasilar airspace opacities. No evidence of pneumothorax. Evaluation of the lung bases slightly limited by overlying soft tissues. No acute osseous abnormality. IMPRESSION: Borderline cardiomegaly, pulmonary vascular congestion and slightly increased nonspecific bibasilar airspace opacities. Findings may represent mild CHF with dependent edema versus small bilateral posteriorly layering pleural effusions and atelectasis. Bibasilar pneumonia is also a consideration in the appropriate clinical setting but is considered less likely. Electronically Signed   By: Jacqulynn Cadet M.D.   On: 03/27/2019 16:07    Pending Labs Unresulted Labs (From  admission, onward)    Start     Ordered   03/28/19 6599  Basic metabolic panel  Tomorrow morning,   R     03/27/19 2020   03/28/19 0500  CBC  Tomorrow morning,   R     03/27/19 2020   03/27/19 2021  Culture, sputum-assessment  Once,   R     03/27/19 2020   03/27/19 2021  MRSA PCR Screening  Once,   R     03/27/19 2020   03/27/19 1527  Blood Culture (routine x 2)  BLOOD CULTURE X 2,   STAT     03/27/19 1527   03/27/19 1527  Urine culture  ONCE - STAT,   STAT  03/27/19 1527          Vitals/Pain Today's Vitals   03/27/19 2030 03/27/19 2100 03/27/19 2127 03/27/19 2145  BP: 126/78 112/88 (!) 147/67 118/67  Pulse: (!) 110  (!) 109 99  Resp: (!) 25 20 (!) 24 19  Temp:      TempSrc:      SpO2: 95%  92% (!) 88%  Weight:      Height:      PainSc:        Isolation Precautions Airborne and Contact precautions  Medications Medications  aspirin EC tablet 81 mg (has no administration in time range)  atorvastatin (LIPITOR) tablet 20 mg (has no administration in time range)  furosemide (LASIX) tablet 80 mg (80 mg Oral Given 03/27/19 2120)  sildenafil (REVATIO) tablet 20 mg (has no administration in time range)  ARIPiprazole (ABILIFY) tablet 20 mg (has no administration in time range)  donepezil (ARICEPT) tablet 5 mg (has no administration in time range)  bisacodyl (DULCOLAX) suppository 10 mg (has no administration in time range)  polyethylene glycol (MIRALAX / GLYCOLAX) packet 17 g (has no administration in time range)  senna (SENOKOT) tablet 8.6 mg (has no administration in time range)  ferrous sulfate tablet 325 mg (has no administration in time range)  lamoTRIgine (LAMICTAL) tablet 100 mg (has no administration in time range)  brimonidine (ALPHAGAN) 0.2 % ophthalmic solution 1 drop (has no administration in time range)  dorzolamide-timolol (COSOPT) 22.3-6.8 MG/ML ophthalmic solution 1 drop (has no administration in time range)  latanoprost (XALATAN) 0.005 % ophthalmic solution  1 drop (has no administration in time range)  heparin injection 5,000 Units (5,000 Units Subcutaneous Given 03/27/19 2132)  ipratropium-albuterol (DUONEB) 0.5-2.5 (3) MG/3ML nebulizer solution 3 mL (has no administration in time range)  ipratropium-albuterol (DUONEB) 0.5-2.5 (3) MG/3ML nebulizer solution 3 mL (has no administration in time range)  budesonide (PULMICORT) nebulizer solution 0.25 mg (has no administration in time range)  methylPREDNISolone sodium succinate (SOLU-MEDROL) 40 mg/mL injection 40 mg (40 mg Intravenous Given 03/27/19 2124)  ceFEPIme (MAXIPIME) 1 g in sodium chloride 0.9 % 100 mL IVPB (has no administration in time range)  insulin aspart (novoLOG) injection 0-9 Units (has no administration in time range)  insulin aspart (novoLOG) injection 0-5 Units (0 Units Subcutaneous Not Given 03/27/19 2131)  ceFEPIme (MAXIPIME) 2 g in sodium chloride 0.9 % 100 mL IVPB (0 g Intravenous Stopped 03/27/19 1739)  vancomycin (VANCOCIN) 1,500 mg in sodium chloride 0.9 % 500 mL IVPB (0 mg Intravenous Stopped 03/27/19 2033)  methylPREDNISolone sodium succinate (SOLU-MEDROL) 125 mg/2 mL injection 125 mg (125 mg Intravenous Given 03/27/19 1542)  albuterol (PROVENTIL HFA;VENTOLIN HFA) 108 (90 Base) MCG/ACT inhaler 4 puff (4 puffs Inhalation Given 03/27/19 1545)  potassium chloride SA (K-DUR,KLOR-CON) CR tablet 20 mEq (20 mEq Oral Given 03/27/19 1834)    Mobility walks with device High fall risk   Focused Assessments Pulmonary Assessment Handoff:  Lung sounds:   O2 Device: NRB O2 Flow Rate (L/min): 15 L/min      R Recommendations: See Admitting Provider Note  Report given to:   Additional Notes:  Pt from home, taken care of by family. Pt on 14L of O2 per family and at rest sits in high 80's. However, over weekend pt had been at 40% oxygen saturation. Pt is on 15L NRB. Pt is alert and oriented. Pt has two IV's. Pt received IV ABX and IV steroids in ER. No fever in ER. Covid test  negative. Pt niece, Olin Hauser (#  in chart), would like to be updated periodically.

## 2019-03-27 NOTE — ED Notes (Signed)
Marthenia Rolling, MD at bedside

## 2019-03-27 NOTE — ED Triage Notes (Signed)
Pt here from home with c/o low sats and sob , sats in the 40 and 50's on 14 Liters ,pt placed a NRB by ems sats up to 80 's  Pt temp 99.5 with ems

## 2019-03-27 NOTE — H&P (Signed)
History and Physical  Elizabeth Thompson AUQ:[id number] DOB: 1951/09/06 DOA: 03/27/2019  Referring physician: ER physician PCP: Everardo Beals, NP  Outpatient Specialists:    Patient coming from: Home.  Chief Complaint: Worsening shortness of breath and decreased O2 sat.  HPI: Patient is a 68 year old African-American female with past medical history significant for COPD, severe pulmonary hypertension, chronic respiratory failure on 6 L of oxygen.  Patient has been admitted repeatedly due to acute on chronic respiratory failure.  Other past medical history includes hypertension, hyperlipidemia, tobacco use, GERD, diabetes mellitus without complications and bipolar disorder amongst other medical problems.  Patient was discharged from this hospital on March 09, 2019.  The discharge plan included for the patient to be discharged back home with home hospice.  Apparently, 1 of the patient's care went to see the patient earlier today and find out that the O2 sat was significantly decreased.  The patient is requiring 15 L of supplemental oxygen for now.  Blood gas done revealed pH of 7.39, PCO2 of 43.8 and PO2 of 22 (suspect that this is venous gas).  Cardiac BNP is elevated at 593.2, however, patient serum creatinine is 1.99.  Troponin is 0.03.  Procalcitonin is 0.16.  Chest x-ray and CT reveal borderline cardiomegaly, pulmonary vascular congestion and slightly increased non-specific bibasilar airspace opacities.  Patient be admitted for further assessment and management.  No headache, no neck pain, no URI symptoms, no chest pain, and no productive cough, no GI symptoms and no urinary symptoms.  ED Course: Patient is currently on supplemental oxygen.  Patient has been given nebulizer treatment and IV Solu-Medrol.  ER provider discussed case with the ICU team, and they were advised for the hospitalist team to admit patient.  Pertinent labs: Please note above documentation.  Mistry revealed sodium of 139,  potassium of 3.3, chloride 100, CO2 23, BUN of 22 and serum creatinine of 1.9 and blood sugar of 150.  CBC reveals WBC of 7.7, hemoglobin of 11.9, hematocrit of 40.2, MCV of 90.4 platelet count of 425.  Rapid COVID-19 test is negative.  EKG: Independently reviewed.   Imaging: independently reviewed.    Review of Systems:  Negative for fever, visual changes, sore throat, rash, new muscle aches, chest pain, SOB, dysuria, bleeding, n/v/abdominal pain.  Past Medical History:  Diagnosis Date  . Acute respiratory failure ([place]) 06/09/2017  . Arthritis   . Back pain   . Bell's palsy   . Bipolar disorder ([place])   . Bronchitis   . Chronic low back pain 05/09/2015  . Cough with expectoration 05/21/16   recent diagnosis of bronchitis  . Cyst of right kidney   . Diabetes mellitus without complication ([place])    type 2  . Frequency of urination   . GERD (gastroesophageal reflux disease)   . Glaucoma   . History of blood transfusion   . History of colon polyps   . History of hiatal hernia   . History of tobacco use   . Hypercalcemia   . Hyperlipidemia   . Hypertension   . Memory disorder 09/05/2014  . On home oxygen therapy    3L/M [place] at night   . Schizo-affective psychosis ([place])   . Shortness of breath dyspnea    exertion, or with out oxygen  . Uterine fibroid     Past Surgical History:  Procedure Laterality Date  . ABDOMINAL HYSTERECTOMY    . APPENDECTOMY    . COLONOSCOPY W/ POLYPECTOMY    . IR GENERIC HISTORICAL  04/29/2016   IR RADIOLOGIST EVAL & MGMT 04/29/2016 Corrie Mckusick, DO [facility] INTER[facility] RAD  . IR GENERIC HISTORICAL  10/01/2016   IR RADIOLOGIST EVAL & MGMT 10/01/2016 [facility] INTER[facility]  . IR GENERIC HISTORICAL  11/11/2016   IR RADIOLOGIST EVAL & MGMT 11/11/2016 Corrie Mckusick, DO [facility]  . IR GENERIC HISTORICAL  07/16/2016   IR RADIOLOGIST EVAL & MGMT 07/16/2016 Corrie Mckusick, DO [facility] INTER[facility] RAD  . IR RADIOLOGIST EVAL & MGMT  03/10/2017  . IR RADIOLOGIST EVAL & MGMT   05/13/2017  . IR RADIOLOGIST EVAL & MGMT  04/27/2018  . LUMBAR LAMINECTOMY/DECOMPRESSION MICRODISCECTOMY N/A 08/14/2015   Procedure: LUMBAR DECOMPRESSION MICRODISCECTOMY L3-S1;  Surgeon: Melina Schools, MD;  Location: [facility];  Service: Orthopedics;  Laterality: N/A;  . RADIOFREQUENCY ABLATION Left 02/05/2017   Procedure: LEFT RENAL CRYOABLATION;  Surgeon: Corrie Mckusick, DO;  Location: [facility];  Service: Anesthesiology;  Laterality: Left;  . RIGHT HEART CATH N/A 07/20/2017   Procedure: Right Heart Cath;  Surgeon: Nigel Mormon, MD;  Location: [facility];  Service: Cardiovascular;  Laterality: N/A;  . RIGHT/LEFT HEART CATH AND CORONARY ANGIOGRAPHY N/A 06/11/2017   Procedure: Right/Left Heart Cath and Coronary Angiography;  Surgeon: Dixie Dials, MD;  Location: [facility];  Service: Cardiovascular;  Laterality: N/A;  . TONSILLECTOMY AND ADENOIDECTOMY       reports that she quit smoking about 3 years ago. Her smoking use included cigarettes. She has a 28.00 pack-year smoking history. She has never used smokeless tobacco. She reports that she does not drink alcohol or use drugs.  Allergies  Allergen Reactions  . Codeine Nausea And Vomiting  . Nitrostat [Nitroglycerin] Other (See Comments)    Pt is on revatio  . Prednisone Other (See Comments)    Has to monitor because she is a diabetic     Family History  Problem Relation Age of Onset  . Dementia Mother   . Alzheimer's disease Mother   . Heart disease Mother   . Hypertension Mother   . Diabetes Mother   . Diabetes Father   . Alzheimer's disease Sister   . Heart disease Brother   . Heart attack Brother   . Bladder Cancer Neg Hx   . Kidney cancer Neg Hx   . Prostate cancer Neg Hx      Prior to Admission medications   Medication Sig Start Date End Date Taking? Authorizing Provider  acetaminophen (TYLENOL) 650 MG CR tablet Take 1,300 mg by mouth every 12 (twelve) hours as needed for pain.     [provider]   albuterol (PROAIR HFA) 108 (90 Base) MCG/ACT inhaler Inhale 2 puffs into the lungs every 6 (six) hours as needed for wheezing or shortness of breath.    [provider]  albuterol (PROVENTIL) (2.5 MG/3ML) 0.083% nebulizer solution Take 2.5 mg by nebulization every 4 (four) hours as needed for wheezing or shortness of breath.     [provider]  amLODipine (NORVASC) 10 MG tablet Take 1 tablet (10 mg total) by mouth daily. 01/12/19   Shelly Coss, MD  ARIPiprazole (ABILIFY) 20 MG tablet Take 1 tablet (20 mg total) by mouth at bedtime. 08/27/14   Kerrie Buffalo, NP  ASPERCREME LIDOCAINE EX Apply 1 application topically 2 (two) times daily as needed (back pain).    [provider]  aspirin EC 81 MG EC tablet Take 1 tablet (81 mg total) by mouth daily. 06/15/17   Regalado, Cassie Freer, MD  atorvastatin (LIPITOR) 20 MG tablet Take 20 mg by mouth daily after supper.    [provider]  bisacodyl (DULCOLAX) 10 MG suppository Place 1 suppository (10 mg total) rectally daily as needed for moderate constipation. 09/08/16   Geradine Girt, DO  brimonidine (ALPHAGAN) 0.2 % ophthalmic solution Place 1 drop into both eyes 2 (two) times daily. 02/09/19   [provider]  budesonide-formoterol (SYMBICORT) 160-4.5 MCG/ACT inhaler Inhale 2 puffs into the lungs 2 (two) times daily. 01/23/19   Tanda Rockers, MD  donepezil (ARICEPT) 5 MG tablet Take 1 tablet (5 mg total) by mouth at bedtime. 08/27/14   Kerrie Buffalo, NP  dorzolamide-timolol (COSOPT) 22.3-6.8 MG/ML ophthalmic solution Place 1 drop into both eyes 2 (two) times daily.    [provider]  feeding supplement (BOOST / RESOURCE BREEZE) LIQD Take 1 Container by mouth 3 (three) times daily between meals. Patient not taking: Reported on 02/24/2019 07/13/17   Mariel Aloe, MD  ferrous sulfate 325 (65 FE) MG tablet Take 325 mg by mouth daily after supper.    [provider]  furosemide (LASIX) 80 MG  tablet Take 1 tablet (80 mg total) by mouth daily. 01/12/19   Shelly Coss, MD  glimepiride (AMARYL) 4 MG tablet Take 0.5 tablets (2 mg total) by mouth daily with breakfast. 06/14/17   Regalado, Belkys A, MD  glucose blood (ACCU-CHEK AVIVA) test strip 1 each by Other route 2 (two) times daily. 08/04/16   Jearld Fenton, NP  Ipratropium-Albuterol (COMBIVENT RESPIMAT) 20-100 MCG/ACT AERS respimat Inhale 1-2 puffs into the lungs every 4 (four) hours as needed for wheezing.     [provider]  lamoTRIgine (LAMICTAL) 100 MG tablet Take 1 tablet (100 mg total) by mouth every evening. Patient taking differently: Take 100 mg by mouth daily after supper.  08/27/14   Kerrie Buffalo, NP  latanoprost (XALATAN) 0.005 % ophthalmic solution Place 1 drop into both eyes at bedtime. 08/27/14   Kerrie Buffalo, NP  Magnesium 250 MG TABS Take 250 mg by mouth See admin instructions. Take 1 tablet (250 mg) by mouth every other day after supper    [provider]  Menthol, Topical Analgesic, (BIOFREEZE EX) Apply 1 application topically 2 (two) times daily as needed (back pain).    [provider]  metFORMIN (GLUCOPHAGE) 1000 MG tablet Take 1 tablet (1,000 mg total) by mouth daily with breakfast. Patient taking differently: Take 1,000 mg by mouth daily with breakfast. If not eating well, give 500 mg twice daily instead of 1054m daily 07/13/17   NMariel Aloe MD  Phenylephrine-APAP-Guaifenesin ([facility] HospitalSINUS-MAX) 10-650-400 MG/20ML LIQD Take 20 mLs by mouth 2 (two) times daily.    [provider]  polyethylene glycol (MIRALAX / GLYCOLAX) packet Take 17 g by mouth daily. Patient taking differently: Take 17 g by mouth daily as needed (for constipation). Mix in 4-8 oz liquid and drink 09/08/16   VGeradine Girt DO  Respiratory Therapy Supplies (FLUTTER) DEVI Use as directed. 05/26/17   Parrett, TFonnie Mu NP  Selenium Sulf-Pyrithione-Urea 2.25 % SHAM Apply 1 application topically See admin  instructions. Apply topically to scalp for dermatitis once every 14 days 01/25/15   [provider]  senna (SENOKOT) 8.6 MG tablet Take 1 tablet by mouth daily as needed for constipation.     [provider]  sildenafil (REVATIO) 20 MG tablet Take 1 tablet (20 mg total) by mouth 3 (three) times daily. 06/14/17   Regalado, [employer]  A, MD    Physical Exam: Vitals:   03/27/19 1645 03/27/19 1700 03/27/19 1739 03/27/19 1745  BP: (!) 128/46  139/73 136/77  Pulse: 99 (!) 101 (!) 108 (!) 107  Resp: 20 19 (!) 22 (!) 21  Temp:      TempSrc:      SpO2: (!) 81% (!) 84% (!) 82% 92%  Weight:      Height:        Constitutional:  . Appears calm but in mild respiratory distress.  Eyes:  [name] Mild pallor. No jaundice.  ENMT:  . external ears, nose appear normal Neck:  . Neck is supple. No JVD Respiratory:  . Decreased air entry globally.   Cardiovascular:  . S1S2 . No LE extremity edema   Abdomen:  . Abdomen is soft and non tender. Organs are difficult to assess. Neurologic:  . Awake and alert. . Moves all limbs.  Wt Readings from Last 3 Encounters:  03/27/19 86.6 kg  03/09/19 87.8 kg  01/23/19 86.6 kg    I have personally reviewed following labs and imaging studies  Labs on Admission:  CBC: Recent Labs  Lab 03/27/19 1524 03/27/19 1531  WBC  --  7.7  NEUTROABS  --  6.2  HGB 12.6 11.9*  HCT 37.0 40.2  MCV  --  92.4  PLT  --  989*   Basic Metabolic Panel: Recent Labs  Lab 03/27/19 1524 03/27/19 1531  NA 138 139  K 3.4* 3.3*  CL  --  100  CO2  --  23  GLUCOSE  --  150*  BUN  --  22  CREATININE  --  1.99*  CALCIUM  --  9.5   Liver Function Tests: Recent Labs  Lab 03/27/19 1531  AST 20  ALT 12  ALKPHOS 64  BILITOT 0.6  PROT 7.0  ALBUMIN 3.9   No results for input(s): LIPASE, AMYLASE in the last 168 hours. No results for input(s): AMMONIA in the last 168 hours. Coagulation Profile: Recent Labs  Lab 03/27/19 1531  INR 0.9   Cardiac Enzymes:  No results for input(s): CKTOTAL, CKMB, CKMBINDEX, TROPONINI in the last 168 hours. BNP (last 3 results) No results for input(s): PROBNP in the last 8760 hours. HbA1C: No results for input(s): HGBA1C in the last 72 hours. CBG: No results for input(s): GLUCAP in the last 168 hours. Lipid Profile: No results for input(s): CHOL, HDL, LDLCALC, TRIG, CHOLHDL, LDLDIRECT in the last 72 hours. Thyroid Function Tests: No results for input(s): TSH, T4TOTAL, FREET4, T3FREE, THYROIDAB in the last 72 hours. Anemia Panel: No results for input(s): VITAMINB12, FOLATE, FERRITIN, TIBC, IRON, RETICCTPCT in the last 72 hours. Urine analysis:    Component Value Date/Time   COLORURINE YELLOW 03/27/2019 1618   APPEARANCEUR CLEAR 03/27/2019 1618   LABSPEC 1.013 03/27/2019 1618   PHURINE 5.0 03/27/2019 1618   GLUCOSEU NEGATIVE 03/27/2019 1618   HGBUR NEGATIVE 03/27/2019 1618   BILIRUBINUR NEGATIVE 03/27/2019 1618   KETONESUR NEGATIVE 03/27/2019 1618   PROTEINUR 30 (A) 03/27/2019 1618   UROBILINOGEN 0.2 08/17/2015 0109   NITRITE NEGATIVE 03/27/2019 1618   LEUKOCYTESUR NEGATIVE 03/27/2019 1618   Sepsis Labs: _0 (procalcitonin:4,lacticidven:4) ) Recent Results (from the past 240 hour(s))  SARS Coronavirus 2 [facility] order, Performed in [facility] hospital lab)     Status: None   Collection Time: 03/27/19  3:46 PM  Result Value Ref Range Status   SARS Coronavirus 2 NEGATIVE NEGATIVE Final    Comment: (NOTE) If result  is NEGATIVE SARS-CoV-2 target nucleic acids are NOT DETECTED. The SARS-CoV-2 RNA is generally detectable in upper and lower  respiratory specimens during the acute phase of infection. The lowest  concentration of SARS-CoV-2 viral copies this assay can detect is 250  copies / mL. A negative result does not preclude SARS-CoV-2 infection  and should not be used as the sole basis for treatment or other  patient management decisions.  A negative result may occur with  improper  specimen collection / handling, submission of specimen other  than nasopharyngeal swab, presence of viral mutation(s) within the  areas targeted by this assay, and inadequate number of viral copies  (<250 copies / mL). A negative result must be combined with clinical  observations, patient history, and epidemiological information. If result is POSITIVE SARS-CoV-2 target nucleic acids are DETECTED. The SARS-CoV-2 RNA is generally detectable in upper and lower  respiratory specimens dur ing the acute phase of infection.  Positive  results are indicative of active infection with SARS-CoV-2.  Clinical  correlation with patient history and other diagnostic information is  necessary to determine patient infection status.  Positive results do  not rule out bacterial infection or co-infection with other viruses. If result is PRESUMPTIVE POSTIVE SARS-CoV-2 nucleic acids MAY BE PRESENT.   A presumptive positive result was obtained on the submitted specimen  and confirmed on repeat testing.  While 2019 novel coronavirus  (SARS-CoV-2) nucleic acids may be present in the submitted sample  additional confirmatory testing may be necessary for epidemiological  and / or clinical management purposes  to differentiate between  SARS-CoV-2 and other Sarbecovirus currently known to infect humans.  If clinically indicated additional testing with an alternate test  methodology [phone]) is advised. The SARS-CoV-2 RNA is generally  detectable in upper and lower respiratory sp ecimens during the acute  phase of infection. The expected result is Negative. Fact Sheet for Patients:  StrictlyIdeas.no Fact Sheet for Healthcare Providers: BankingDealers.co.za This test is not yet approved or cleared by the [place] FDA and has been authorized for detection and/or diagnosis of SARS-CoV-2 by FDA under an Emergency Use Authorization (EUA).  This EUA will remain in  effect (meaning this test can be used) for the duration of the COVID-19 declaration under Section 564(b)(1) of the Act, 21 U.S.C. section 360bbb-3(b)(1), unless the authorization is terminated or revoked sooner. Performed at [facility], [place] [address], [place], [place] [address]       Radiological Exams on Admission: Dg Chest Port 1 View  Result Date: 03/27/2019 CLINICAL DATA:  68 year old female with hypoxia EXAM: PORTABLE CHEST 1 VIEW COMPARISON:  Prior chest x-ray and CT scan of the chest 02/25/2019 FINDINGS: Stable borderline cardiomegaly. Mediastinal contours are unchanged. Slightly increased pulmonary vascular congestion and nonspecific bibasilar airspace opacities. No evidence of pneumothorax. Evaluation of the lung bases slightly limited by overlying soft tissues. No acute osseous abnormality. IMPRESSION: Borderline cardiomegaly, pulmonary vascular congestion and slightly increased nonspecific bibasilar airspace opacities. Findings may represent mild CHF with dependent edema versus small bilateral posteriorly layering pleural effusions and atelectasis. Bibasilar pneumonia is also a consideration in the appropriate clinical setting but is considered less likely. Electronically Signed   By: Jacqulynn Cadet M.D.   On: 03/27/2019 16:07    EKG: Independently reviewed.   Active Problems:   Acute on chronic respiratory failure (HCC)   Assessment/Plan Acute on chronic respiratory failure with hypoxia: Suspect this is secondary to severe pulmonary hypertension Cannot rule out COPD with exacerbation  Admit patient to stepdown IV Solu-Medrol Nebulizer treatment Guarded prognosis Consider reconsulting the hospice team.  Severe pulmonary hypertension/possible COPD with exacerbation: See above management. Further management will depend on hospital course.  Hypertension: Episode of hypotension was noted earlier Hold all antihypertensives. Continue to monitor for now.   Diabetes mellitus: Continue with sliding scale insulin coverage.  Hypokalemia: Potassium was 3.3. Cautiously repeat. Continue to monitor renal function and electrolytes.  CKD 4: Stable. Continue to monitor.   Guarded prognosis  DVT prophylaxis: Subcu heparin Code Status: DNR Family Communication:  Disposition Plan: This will depend on hospital course Consults called: None Admission status: Inpatient  Time spent: 65 minutes  Dana Allan, MD  [facility] Pager #: [phone] 7PM-7AM contact night coverage as above  03/27/2019, 6:10 PM

## 2019-03-27 NOTE — ED Provider Notes (Signed)
[facility] DEPARTMENT Provider Note   CSN: [id number] Arrival date & time: 03/27/19  1457    History   Chief Complaint Chief Complaint  Patient presents with   Respiratory Distress    HPI Elizabeth Thompson is a 68 y.o. female.     HPI   68 year old with history of bipolar disorder, pulmonary hypertension, COPD on 6 L nasal cannula at home, diabetes mellitus type 2 presents from home per home health nurse reporting saturation of 30%.  EMS called and confirmed low saturations (2 trucks were actually called due to the first crew also getting saturations that were low and wanting to confirm with secondary equipment).  Patient states that she has not been more short of breath nor has she had any increased coughing.  Denies any recent fevers or exposure to Covid-19 virus.  Appetite is been normal.  Does not believe that she has been gaining weight.  Was in the hospital 1 month ago for COPD exacerbation.  Denies any chest pain at this time.    Past Medical History:  Diagnosis Date   Acute respiratory failure ([facility]) 06/09/2017   Arthritis    Back pain    Bell's palsy    Bipolar disorder (HCC)    Bronchitis    Chronic low back pain 05/09/2015   Cough with expectoration 05/21/16   recent diagnosis of bronchitis   Cyst of right kidney    Diabetes mellitus without complication ([place])    type 2   Frequency of urination    GERD (gastroesophageal reflux disease)    Glaucoma    History of blood transfusion    History of colon polyps    History of hiatal hernia    History of tobacco use    Hypercalcemia    Hyperlipidemia    Hypertension    Memory disorder 09/05/2014   On home oxygen therapy    3L/M [place] at night    Schizo-affective psychosis ([place])    Shortness of breath dyspnea    exertion, or with out oxygen   Uterine fibroid     Patient Active Problem List   Diagnosis Date Noted   Cor pulmonale, chronic (HCC)    Acute on  chronic right-sided heart failure (HCC)    Hyponatremia with excess extracellular fluid volume    Goals of care, counseling/discussion    Palliative care by specialist    OSA (obstructive sleep apnea)    AKI (acute kidney injury) ([place]) 01/03/2019   Acute respiratory failure ([place]) 01/03/2019   Hypertension 07/02/2017   Chronic respiratory failure with hypoxia, on home O2 therapy ([place]) 07/02/2017   COPD (chronic obstructive pulmonary disease) ([place]) 07/02/2017   Diabetes mellitus without complication ([place]) 94/17/4081   Pulmonary hypertension ([place]) 07/02/2017   Bipolar disorder ([place]) 07/02/2017   Hyperlipidemia 07/02/2017   Acute hyponatremia 44/81/8563   Acute metabolic encephalopathy 14/97/0263   CKD (chronic kidney disease) stage 3, GFR 30-59 ml/min (HCC) 07/02/2017   Elevated troponin    Cor pulmonale, acute ([place])    Acute on chronic respiratory failure ([place]) 06/09/2017   Chronic renal disease, stage III ([place]) 05/05/2017   Chronic respiratory failure with hypoxia ([place]) 05/05/2017   Renal cell carcinoma, left ([place]) 02/05/2017   Dyspnea 12/28/2016   Depression 09/04/2016   Renal malignant tumor ([place]) 06/26/2016   GERD (gastroesophageal reflux disease) 03/22/2016   Insomnia 03/22/2016   COPD GOLD 0  12/06/2015   Essential hypertension 12/06/2015   COPD exacerbation ([place]) 11/20/2015  Multiple lung nodules 11/20/2015   Morbid obesity due to excess calories ([place]) 11/20/2015   Hypersomnia with sleep apnea 11/20/2015   Diabetes mellitus type 2, controlled, without complications ([place]) 01/74/9449   HLD (hyperlipidemia) 11/06/2015   Chronic low back pain 05/09/2015   Gait disorder 05/09/2015   Memory disorder 09/05/2014   Schizoaffective disorder ([place]) 08/18/2014    Past Surgical History:  Procedure Laterality Date   ABDOMINAL HYSTERECTOMY     APPENDECTOMY     COLONOSCOPY W/ POLYPECTOMY     IR GENERIC HISTORICAL  04/29/2016   IR  RADIOLOGIST EVAL & MGMT 04/29/2016 Corrie Mckusick, DO [facility] INTER[facility] RAD   IR GENERIC HISTORICAL  10/01/2016   IR RADIOLOGIST EVAL & MGMT 10/01/2016 [facility]   IR GENERIC HISTORICAL  11/11/2016   IR RADIOLOGIST EVAL & MGMT 11/11/2016 Corrie Mckusick, DO [facility]   IR GENERIC HISTORICAL  07/16/2016   IR RADIOLOGIST EVAL & MGMT 07/16/2016 Corrie Mckusick, DO [facility]   IR RADIOLOGIST EVAL & MGMT  03/10/2017   IR RADIOLOGIST EVAL & MGMT  05/13/2017   IR RADIOLOGIST EVAL & MGMT  04/27/2018   LUMBAR LAMINECTOMY/DECOMPRESSION MICRODISCECTOMY N/A 08/14/2015   Procedure: LUMBAR DECOMPRESSION MICRODISCECTOMY L3-S1;  Surgeon: Melina Schools, MD;  Location: [facility];  Service: Orthopedics;  Laterality: N/A;   RADIOFREQUENCY ABLATION Left 02/05/2017   Procedure: LEFT RENAL CRYOABLATION;  Surgeon: Corrie Mckusick, DO;  Location: [facility];  Service: Anesthesiology;  Laterality: Left;   RIGHT HEART CATH N/A 07/20/2017   Procedure: Right Heart Cath;  Surgeon: Nigel Mormon, MD;  Location: [facility];  Service: Cardiovascular;  Laterality: N/A;   RIGHT/LEFT HEART CATH AND CORONARY ANGIOGRAPHY N/A 06/11/2017   Procedure: Right/Left Heart Cath and Coronary Angiography;  Surgeon: Dixie Dials, MD;  Location: [facility];  Service: Cardiovascular;  Laterality: N/A;   TONSILLECTOMY AND ADENOIDECTOMY       OB History   No obstetric history on file.      Home Medications    Prior to Admission medications   Medication Sig Start Date End Date Taking? Authorizing Provider  acetaminophen (TYLENOL) 650 MG CR tablet Take 1,300 mg by mouth every 12 (twelve) hours as needed for pain.     [provider]  albuterol (PROAIR HFA) 108 (90 Base) MCG/ACT inhaler Inhale 2 puffs into the lungs every 6 (six) hours as needed for wheezing or shortness of breath.    [provider]  albuterol (PROVENTIL) (2.5 MG/3ML) 0.083% nebulizer solution Take 2.5 mg by nebulization every 4  (four) hours as needed for wheezing or shortness of breath.     [provider]  amLODipine (NORVASC) 10 MG tablet Take 1 tablet (10 mg total) by mouth daily. 01/12/19   Shelly Coss, MD  ARIPiprazole (ABILIFY) 20 MG tablet Take 1 tablet (20 mg total) by mouth at bedtime. 08/27/14   Kerrie Buffalo, NP  ASPERCREME LIDOCAINE EX Apply 1 application topically 2 (two) times daily as needed (back pain).    [provider]  aspirin EC 81 MG EC tablet Take 1 tablet (81 mg total) by mouth daily. 06/15/17   Regalado, Belkys A, MD  atorvastatin (LIPITOR) 20 MG tablet Take 20 mg by mouth daily after supper.    [provider]  bisacodyl (DULCOLAX) 10 MG suppository Place 1 suppository (10 mg total) rectally daily as needed for moderate constipation. 09/08/16   Geradine Girt, DO  brimonidine (ALPHAGAN) 0.2 % ophthalmic solution Place  1 drop into both eyes 2 (two) times daily. 02/09/19   [provider]  budesonide-formoterol (SYMBICORT) 160-4.5 MCG/ACT inhaler Inhale 2 puffs into the lungs 2 (two) times daily. 01/23/19   Tanda Rockers, MD  donepezil (ARICEPT) 5 MG tablet Take 1 tablet (5 mg total) by mouth at bedtime. 08/27/14   Kerrie Buffalo, NP  dorzolamide-timolol (COSOPT) 22.3-6.8 MG/ML ophthalmic solution Place 1 drop into both eyes 2 (two) times daily.    [provider]  feeding supplement (BOOST / RESOURCE BREEZE) LIQD Take 1 Container by mouth 3 (three) times daily between meals. Patient not taking: Reported on 02/24/2019 07/13/17   Mariel Aloe, MD  ferrous sulfate 325 (65 FE) MG tablet Take 325 mg by mouth daily after supper.    [provider]  furosemide (LASIX) 80 MG tablet Take 1 tablet (80 mg total) by mouth daily. 01/12/19   Shelly Coss, MD  glimepiride (AMARYL) 4 MG tablet Take 0.5 tablets (2 mg total) by mouth daily with breakfast. 06/14/17   Regalado, Belkys A, MD  glucose blood (ACCU-CHEK AVIVA) test strip 1 each by Other route 2 (two)  times daily. 08/04/16   Jearld Fenton, NP  Ipratropium-Albuterol (COMBIVENT RESPIMAT) 20-100 MCG/ACT AERS respimat Inhale 1-2 puffs into the lungs every 4 (four) hours as needed for wheezing.     [provider]  lamoTRIgine (LAMICTAL) 100 MG tablet Take 1 tablet (100 mg total) by mouth every evening. Patient taking differently: Take 100 mg by mouth daily after supper.  08/27/14   Kerrie Buffalo, NP  latanoprost (XALATAN) 0.005 % ophthalmic solution Place 1 drop into both eyes at bedtime. 08/27/14   Kerrie Buffalo, NP  Magnesium 250 MG TABS Take 250 mg by mouth See admin instructions. Take 1 tablet (250 mg) by mouth every other day after supper    [provider]  Menthol, Topical Analgesic, (BIOFREEZE EX) Apply 1 application topically 2 (two) times daily as needed (back pain).    [provider]  metFORMIN (GLUCOPHAGE) 1000 MG tablet Take 1 tablet (1,000 mg total) by mouth daily with breakfast. Patient taking differently: Take 1,000 mg by mouth daily with breakfast. If not eating well, give 500 mg twice daily instead of 1031m daily 07/13/17   NMariel Aloe MD  Phenylephrine-APAP-Guaifenesin ([facility] System[facility]-MAX) 10-650-400 MG/20ML LIQD Take 20 mLs by mouth 2 (two) times daily.    [provider]  polyethylene glycol (MIRALAX / GLYCOLAX) packet Take 17 g by mouth daily. Patient taking differently: Take 17 g by mouth daily as needed (for constipation). Mix in 4-8 oz liquid and drink 09/08/16   VGeradine Girt DO  Respiratory Therapy Supplies (FLUTTER) DEVI Use as directed. 05/26/17   Parrett, TFonnie Mu NP  Selenium Sulf-Pyrithione-Urea 2.25 % SHAM Apply 1 application topically See admin instructions. Apply topically to scalp for dermatitis once every 14 days 01/25/15   [provider]  senna (SENOKOT) 8.6 MG tablet Take 1 tablet by mouth daily as needed for constipation.     [provider]  sildenafil (REVATIO) 20 MG tablet Take 1 tablet (20 mg  total) by mouth 3 (three) times daily. 06/14/17   Regalado, BCassie Freer MD    Family History Family History  Problem Relation Age of Onset   Dementia Mother    Alzheimer's disease Mother    Heart disease Mother    Hypertension Mother    Diabetes Mother    Diabetes Father    Alzheimer's disease Sister  Heart disease Brother    Heart attack Brother    Bladder Cancer Neg Hx    Kidney cancer Neg Hx    Prostate cancer Neg Hx     Social History Social History   Tobacco Use   Smoking status: Former Smoker    Packs/day: 1.00    Years: 28.00    Pack years: 28.00    Types: Cigarettes    Last attempt to quit: 07/15/2015    Years since quitting: 3.7   Smokeless tobacco: Never Used  Substance Use Topics   Alcohol use: No    Alcohol/week: 0.0 standard drinks   Drug use: No     Allergies   Codeine; Nitrostat [nitroglycerin]; and Prednisone   Review of Systems Review of Systems  Constitutional: Negative for chills and fever.  HENT: Negative for ear pain and sore throat.   Eyes: Negative for pain and visual disturbance.  Respiratory: Negative for cough and shortness of breath.   Cardiovascular: Negative for chest pain and palpitations.  Gastrointestinal: Negative for abdominal pain and vomiting.  Genitourinary: Negative for dysuria and hematuria.  Musculoskeletal: Negative for arthralgias and back pain.  Skin: Negative for color change and rash.  Allergic/Immunologic: Negative for food allergies and immunocompromised state.  Neurological: Negative for seizures and syncope.  All other systems reviewed and are negative.    Physical Exam Updated Vital Signs BP 130/72    Pulse (!) 114    Temp 99.2 F (37.3 C) (Oral)    Resp 17    SpO2 (!) 41%   Physical Exam Vitals signs and nursing note reviewed.  Constitutional:      Appearance: She is well-developed. She is obese. She is not ill-appearing or diaphoretic.  HENT:     Head: Normocephalic and atraumatic.      Nose: No congestion or rhinorrhea.     Mouth/Throat:     Pharynx: No oropharyngeal exudate or posterior oropharyngeal erythema.  Eyes:     Conjunctiva/sclera: Conjunctivae normal.  Neck:     Musculoskeletal: Neck supple.  Cardiovascular:     Rate and Rhythm: Normal rate and regular rhythm.     Heart sounds: No murmur.  Pulmonary:     Effort: No respiratory distress.     Breath sounds: Wheezing (minimal bilateral) present.     Comments: Decreased breath sounds bilaterally Abdominal:     Palpations: Abdomen is soft.     Tenderness: There is no abdominal tenderness.  Musculoskeletal:        General: No swelling or tenderness.     Right lower leg: No edema.     Left lower leg: No edema.  Skin:    General: Skin is warm and dry.     Capillary Refill: Capillary refill takes less than 2 seconds.  Neurological:     General: No focal deficit present.     Mental Status: She is alert and oriented to person, place, and time.      ED Treatments / Results  Labs (all labs ordered are listed, but only abnormal results are displayed) Labs Reviewed  I-STAT ARTERIAL BLOOD GAS, ED    EKG EKG Interpretation  Date/Time:  Monday March 27 2019 15:05:04 EDT Ventricular Rate:  114 PR Interval:    QRS Duration: 91 QT Interval:  348 QTC Calculation: 480 R Axis:   141 Text Interpretation:  Sinus tachycardia Low voltage, precordial leads Probable right ventricular hypertrophy background noise Otherwise no significant change Confirmed by Deno Etienne [phone]) on 03/27/2019 3:07:15 PM  Radiology No results found.  Procedures Procedures (including critical care time)  Medications Ordered in ED Medications - No data to display   Initial Impression / Assessment and Plan / ED Course  I have reviewed the triage vital signs and the nursing notes.  Pertinent labs & imaging results that were available during my care of the patient were reviewed by me and considered in my medical decision making  (see chart for details).         68 year old woman with the above past medical history and physical examination and HPI presents to the emergency department with hypoxia notably between 20 and 50% on multiple limb leads.  Patient awake and talking and awake alert and oriented at this time and currently does not complain of shortness of breath and says that she otherwise did not realize she had low saturations until her nurse helped her today.  Afebrile on examination.  Arterial stick performed by myself confirms low oxygenation with 40% on the monitor and ABG showing 38% oxygenation on 15 L nonrebreather facemask.  She has tight breath sounds bilaterally and has known COPD as well as cor pulmonale.  Last spirometry values 2018 showed FEV1 77%.  Possible COVID-19 infection so proper PPE was performed and rapid test negative.  Possible pneumonia due to her poor respiratory reserve so cultures were drawn and empirically started on cefepime and vancomycin given that she was in the hospital 1 month ago this places her at risk for hospital-acquired pneumonia.  Does not appear to be volume overloaded on physical exam she does not have edema, JVD nor does she have rales in the lungs.  This appears to be more consistent with COPD exacerbation with possible overlying infection.  Did not initially give nebulizer treatment due to the COVID-19 suspicion and MDI inhaler was given with 4 puffs and Solu-Medrol 125 mg given.  Not on anticoagulation so PE is still a possibility though seems to be less likely in this patient given that she does not have clinical signs of a DVT.  Patient admitted to the intermediate care unit for her high oxygen demand and further care of possible COPD exacerbation.  Now that me know that the COVID-19 testing is negative we are able to pursue further standard of care therapy for this patient. Final Clinical Impressions(s) / ED Diagnoses   Final diagnoses:  Acute respiratory failure with  hypoxia [facility])    ED Discharge Orders    None       Andee Poles, MD 03/27/19 [place], [place], DO 03/27/19 2332

## 2019-03-27 NOTE — ED Notes (Signed)
Attempted report

## 2019-03-27 NOTE — ED Notes (Signed)
ED TO INPATIENT HANDOFF REPORT  ED Nurse Name and Phone #: [phone] Jeanine Luz Name/Age/Gender Elizabeth Thompson 68 y.o. female Room/Bed: 023C/023C  Code Status   Code Status: Prior  Home/SNF/Other Home Patient oriented to: self, place and situation Is this baseline? Yes   Triage Complete: Triage complete  Chief Complaint SOB  Triage Note Pt here from home with c/o low sats and sob , sats in the 24 and 50's on 14 Liters ,pt placed a NRB by ems sats up to 65 's  Pt temp 99.5 with ems    Allergies Allergies  Allergen Reactions  . Codeine Nausea And Vomiting  . Nitrostat [Nitroglycerin] Other (See Comments)    Pt is on revatio  . Prednisone Other (See Comments)    Has to monitor because she is a diabetic     Level of Care/Admitting Diagnosis ED Disposition    ED Disposition Condition [place]: [place] [[id number]]  Level of Care: Progressive [102]  Diagnosis: Acute on chronic respiratory failure [facility]) [[id number]]  Admitting Physician: Shirlean Mylar  Attending Physician: Dana Allan I [[id number]]  Estimated length of stay: past midnight tomorrow  Certification:: I certify this patient will need inpatient services for at least 2 midnights  PT Class (Do Not Modify): Inpatient [101]  PT Acc Code (Do Not Modify): Private [1]       B Medical/Surgery History Past Medical History:  Diagnosis Date  . Acute respiratory failure ([place]) 06/09/2017  . Arthritis   . Back pain   . Bell's palsy   . Bipolar disorder ([place])   . Bronchitis   . Chronic low back pain 05/09/2015  . Cough with expectoration 05/21/16   recent diagnosis of bronchitis  . Cyst of right kidney   . Diabetes mellitus without complication ([place])    type 2  . Frequency of urination   . GERD (gastroesophageal reflux disease)   . Glaucoma   . History of blood transfusion   . History of colon polyps   . History of hiatal hernia   . History of tobacco use    . Hypercalcemia   . Hyperlipidemia   . Hypertension   . Memory disorder 09/05/2014  . On home oxygen therapy    3L/M [place] at night   . Schizo-affective psychosis ([place])   . Shortness of breath dyspnea    exertion, or with out oxygen  . Uterine fibroid    Past Surgical History:  Procedure Laterality Date  . ABDOMINAL HYSTERECTOMY    . APPENDECTOMY    . COLONOSCOPY W/ POLYPECTOMY    . IR GENERIC HISTORICAL  04/29/2016   IR RADIOLOGIST EVAL & MGMT 04/29/2016 Corrie Mckusick, DO [facility] INTERV RAD  . IR GENERIC HISTORICAL  10/01/2016   IR RADIOLOGIST EVAL & MGMT 10/01/2016 [facility] INTERV RAD  . IR GENERIC HISTORICAL  11/11/2016   IR RADIOLOGIST EVAL & MGMT 11/11/2016 Corrie Mckusick, DO [facility] INTERV RAD  . IR GENERIC HISTORICAL  07/16/2016   IR RADIOLOGIST EVAL & MGMT 07/16/2016 Corrie Mckusick, DO [facility] INTERV RAD  . IR RADIOLOGIST EVAL & MGMT  03/10/2017  . IR RADIOLOGIST EVAL & MGMT  05/13/2017  . IR RADIOLOGIST EVAL & MGMT  04/27/2018  . LUMBAR LAMINECTOMY/DECOMPRESSION MICRODISCECTOMY N/A 08/14/2015   Procedure: LUMBAR DECOMPRESSION MICRODISCECTOMY L3-S1;  Surgeon: Melina Schools, MD;  Location: [place];  Service: Orthopedics;  Laterality: N/A;  . RADIOFREQUENCY ABLATION Left 02/05/2017   Procedure: LEFT RENAL  CRYOABLATION;  Surgeon: Corrie Mckusick, DO;  Location: [facility];  Service: Anesthesiology;  Laterality: Left;  . RIGHT HEART CATH N/A 07/20/2017   Procedure: Right Heart Cath;  Surgeon: Nigel Mormon, MD;  Location: [place] [facility];  Service: Cardiovascular;  Laterality: N/A;  . RIGHT/LEFT HEART CATH AND CORONARY ANGIOGRAPHY N/A 06/11/2017   Procedure: Right/Left Heart Cath and Coronary Angiography;  Surgeon: Dixie Dials, MD;  Location: [place] [facility];  Service: Cardiovascular;  Laterality: N/A;  . TONSILLECTOMY AND ADENOIDECTOMY       A IV Location/Drains/Wounds Patient Lines/Drains/Airways Status   Active Line/Drains/Airways    Name:   Placement date:   Placement time:   Site:    Days:   Peripheral IV 03/27/19 Right Forearm   03/27/19    -    Forearm   less than 1   Peripheral IV 03/27/19 Left;Upper Forearm   03/27/19    1553    Forearm   less than 1   External Urinary Catheter   03/27/19    1740    -   less than 1          Intake/Output Last 24 hours  Intake/Output Summary (Last 24 hours) at 03/27/2019 1856 Last data filed at 03/27/2019 1739 Gross per 24 hour  Intake 100 ml  Output -  Net 100 ml    Labs/Imaging Results for orders placed or performed during the hospital encounter of 03/27/19 (from the past 48 hour(s))  I-STAT 7, (LYTES, BLD GAS, ICA, H+H)     Status: Abnormal   Collection Time: 03/27/19  3:24 PM  Result Value Ref Range   pH, Arterial 7.399 7.350 - 7.450   pCO2 arterial 43.8 32.0 - 48.0 mmHg   pO2, Arterial 22.0 (LL) 83.0 - 108.0 mmHg   Bicarbonate 27.1 20.0 - 28.0 mmol/L   TCO2 28 22 - 32 mmol/L   O2 Saturation 38.0 %   Acid-Base Excess 2.0 0.0 - 2.0 mmol/L   Sodium 138 135 - 145 mmol/L   Potassium 3.4 (L) 3.5 - 5.1 mmol/L   Calcium, Ion 1.21 1.15 - 1.40 mmol/L   HCT 37.0 36.0 - 46.0 %   Hemoglobin 12.6 12.0 - 15.0 g/dL   Patient temperature HIDE    Sample type ARTERIAL    Comment NOTIFIED PHYSICIAN   Lactic acid, plasma     Status: None   Collection Time: 03/27/19  3:31 PM  Result Value Ref Range   Lactic Acid, Venous 1.7 0.5 - 1.9 mmol/L    Comment: Performed at [facility], [address] [place], [place] [address]  Comprehensive metabolic panel     Status: Abnormal   Collection Time: 03/27/19  3:31 PM  Result Value Ref Range   Sodium 139 135 - 145 mmol/L   Potassium 3.3 (L) 3.5 - 5.1 mmol/L   Chloride 100 98 - 111 mmol/L   CO2 23 22 - 32 mmol/L   Glucose, Bld 150 (H) 70 - 99 mg/dL   BUN 22 8 - 23 mg/dL   Creatinine, Ser 1.99 (H) 0.44 - 1.00 mg/dL   Calcium 9.5 8.9 - 10.3 mg/dL   Total Protein 7.0 6.5 - 8.1 g/dL   Albumin 3.9 3.5 - 5.0 g/dL   AST 20 15 - 41 U/L   ALT 12 0 - 44 U/L   Alkaline Phosphatase 64  38 - 126 U/L   Total Bilirubin 0.6 0.3 - 1.2 mg/dL   GFR calc non Af Wyvonnia Lora  25 (L) >60 mL/min   GFR calc Af Amer 29 (L) >60 mL/min   Anion gap 16 (H) 5 - 15    Comment: Performed at [facility] [address] [place], [place] [address]  CBC WITH DIFFERENTIAL     Status: Abnormal   Collection Time: 03/27/19  3:31 PM  Result Value Ref Range   WBC 7.7 4.0 - 10.5 K/uL   RBC 4.35 3.87 - 5.11 MIL/uL   Hemoglobin 11.9 (L) 12.0 - 15.0 g/dL   HCT 40.2 36.0 - 46.0 %   MCV 92.4 80.0 - 100.0 fL   MCH 27.4 26.0 - 34.0 pg   MCHC 29.6 (L) 30.0 - 36.0 g/dL   RDW 18.8 (H) 11.5 - 15.5 %   Platelets 425 (H) 150 - 400 K/uL   nRBC 0.0 0.0 - 0.2 %   Neutrophils Relative % 80 %   Neutro Abs 6.2 1.7 - 7.7 K/uL   Lymphocytes Relative 13 %   Lymphs Abs 1.0 0.7 - 4.0 K/uL   Monocytes Relative 4 %   Monocytes Absolute 0.3 0.1 - 1.0 K/uL   Eosinophils Relative 1 %   Eosinophils Absolute 0.1 0.0 - 0.5 K/uL   Basophils Relative 1 %   Basophils Absolute 0.0 0.0 - 0.1 K/uL   Immature Granulocytes 1 %   Abs Immature Granulocytes 0.04 0.00 - 0.07 K/uL    Comment: Performed at [facility] [address] [place], [place] [address]  Brain natriuretic peptide     Status: Abnormal   Collection Time: 03/27/19  3:31 PM  Result Value Ref Range   B Natriuretic Peptide 593.2 (H) 0.0 - 100.0 pg/mL    Comment: Performed at [facility] [address] [place], [place] [address]  Procalcitonin     Status: None   Collection Time: 03/27/19  3:31 PM  Result Value Ref Range   Procalcitonin 0.16 ng/mL    Comment:        Interpretation: PCT (Procalcitonin) <= 0.5 ng/mL: Systemic infection (sepsis) is not likely. Local bacterial infection is possible. (NOTE)       Sepsis PCT Algorithm           Lower Respiratory Tract                                      Infection PCT Algorithm    ----------------------------     ----------------------------         PCT < 0.25 ng/mL                PCT < 0.10  ng/mL         Strongly encourage             Strongly discourage   discontinuation of antibiotics    initiation of antibiotics    ----------------------------     -----------------------------       PCT 0.25 - 0.50 ng/mL            PCT 0.10 - 0.25 ng/mL               OR       >80% decrease in PCT            Discourage initiation of  antibiotics      Encourage discontinuation           of antibiotics    ----------------------------     -----------------------------         PCT >= 0.50 ng/mL              PCT 0.26 - 0.50 ng/mL               AND        <80% decrease in PCT             Encourage initiation of                                             antibiotics       Encourage continuation           of antibiotics    ----------------------------     -----------------------------        PCT >= 0.50 ng/mL                  PCT > 0.50 ng/mL               AND         increase in PCT                  Strongly encourage                                      initiation of antibiotics    Strongly encourage escalation           of antibiotics                                     -----------------------------                                           PCT <= 0.25 ng/mL                                                 OR                                        > 80% decrease in PCT                                     Discontinue / Do not initiate                                             antibiotics Performed at [facility], [place] [address], [place], [place] [address]   Protime-INR     Status: None   Collection Time: 03/27/19  3:31  PM  Result Value Ref Range   Prothrombin Time 12.5 11.4 - 15.2 seconds   INR 0.9 0.8 - 1.2    Comment: (NOTE) INR goal varies based on device and disease states. Performed at [facility], [place] [address] [place], [place] [address]   Type and screen [place]     Status: None   Collection  Time: 03/27/19  3:38 PM  Result Value Ref Range   ABO/RH(D) O POS    Antibody Screen NEG    Sample Expiration      03/30/2019 Performed at [facility], [place] [address] [place], [place] [address]   SARS Coronavirus 2 [facility] order, Performed in [facility] hospital lab)     Status: None   Collection Time: 03/27/19  3:46 PM  Result Value Ref Range   SARS Coronavirus 2 NEGATIVE NEGATIVE    Comment: (NOTE) If result is NEGATIVE SARS-CoV-2 target nucleic acids are NOT DETECTED. The SARS-CoV-2 RNA is generally detectable in upper and lower  respiratory specimens during the acute phase of infection. The lowest  concentration of SARS-CoV-2 viral copies this assay can detect is 250  copies / mL. A negative result does not preclude SARS-CoV-2 infection  and should not be used as the sole basis for treatment or other  patient management decisions.  A negative result may occur with  improper specimen collection / handling, submission of specimen other  than nasopharyngeal swab, presence of viral mutation(s) within the  areas targeted by this assay, and inadequate number of viral copies  (<250 copies / mL). A negative result must be combined with clinical  observations, patient history, and epidemiological information. If result is POSITIVE SARS-CoV-2 target nucleic acids are DETECTED. The SARS-CoV-2 RNA is generally detectable in upper and lower  respiratory specimens dur ing the acute phase of infection.  Positive  results are indicative of active infection with SARS-CoV-2.  Clinical  correlation with patient history and other diagnostic information is  necessary to determine patient infection status.  Positive results do  not rule out bacterial infection or co-infection with other viruses. If result is PRESUMPTIVE POSTIVE SARS-CoV-2 nucleic acids MAY BE PRESENT.   A presumptive positive result was obtained on the submitted specimen  and confirmed on repeat testing.  While 2019  novel coronavirus  (SARS-CoV-2) nucleic acids may be present in the submitted sample  additional confirmatory testing may be necessary for epidemiological  and / or clinical management purposes  to differentiate between  SARS-CoV-2 and other Sarbecovirus currently known to infect humans.  If clinically indicated additional testing with an alternate test  methodology [phone]) is advised. The SARS-CoV-2 RNA is generally  detectable in upper and lower respiratory sp ecimens during the acute  phase of infection. The expected result is Negative. Fact Sheet for Patients:  StrictlyIdeas.no Fact Sheet for Healthcare Providers: BankingDealers.co.za This test is not yet approved or cleared by the [place] FDA and has been authorized for detection and/or diagnosis of SARS-CoV-2 by FDA under an Emergency Use Authorization (EUA).  This EUA will remain in effect (meaning this test can be used) for the duration of the COVID-19 declaration under Section 564(b)(1) of the Act, 21 U.S.C. section 360bbb-3(b)(1), unless the authorization is terminated or revoked sooner. Performed at [facility], [place] [address] [place], [place] [address]   Urinalysis, Complete w Microscopic     Status: Abnormal   Collection Time: 03/27/19  4:18 PM  Result Value Ref  Range   Color, Urine YELLOW YELLOW   APPearance CLEAR CLEAR   Specific Gravity, Urine 1.013 1.005 - 1.030   pH 5.0 5.0 - 8.0   Glucose, UA NEGATIVE NEGATIVE mg/dL   Hgb urine dipstick NEGATIVE NEGATIVE   Bilirubin Urine NEGATIVE NEGATIVE   Ketones, ur NEGATIVE NEGATIVE mg/dL   Protein, ur 30 (A) NEGATIVE mg/dL   Nitrite NEGATIVE NEGATIVE   Leukocytes,Ua NEGATIVE NEGATIVE   RBC / HPF 0-5 0 - 5 RBC/hpf   WBC, UA 0-5 0 - 5 WBC/hpf   Bacteria, UA RARE (A) NONE SEEN   Squamous Epithelial / LPF 0-5 0 - 5   Mucus PRESENT    Hyaline Casts, UA PRESENT     Comment: Performed at [facility] [address] [place], [place] [address]   Dg Chest Port 1 View  Result Date: 03/27/2019 CLINICAL DATA:  68 year old female with hypoxia EXAM: PORTABLE CHEST 1 VIEW COMPARISON:  Prior chest x-ray and CT scan of the chest 02/25/2019 FINDINGS: Stable borderline cardiomegaly. Mediastinal contours are unchanged. Slightly increased pulmonary vascular congestion and nonspecific bibasilar airspace opacities. No evidence of pneumothorax. Evaluation of the lung bases slightly limited by overlying soft tissues. No acute osseous abnormality. IMPRESSION: Borderline cardiomegaly, pulmonary vascular congestion and slightly increased nonspecific bibasilar airspace opacities. Findings may represent mild CHF with dependent edema versus small bilateral posteriorly layering pleural effusions and atelectasis. Bibasilar pneumonia is also a consideration in the appropriate clinical setting but is considered less likely. Electronically Signed   By: Jacqulynn Cadet M.D.   On: 03/27/2019 16:07    Pending Labs Unresulted Labs (From admission, onward)    Start     Ordered   03/27/19 1550  Troponin I - ONCE - STAT  ONCE - STAT,   STAT     03/27/19 1549   03/27/19 1527  Blood Culture (routine x 2)  BLOOD CULTURE X 2,   STAT     03/27/19 1527   03/27/19 1527  Urine culture  ONCE - STAT,   STAT     03/27/19 1527   Signed and Held  CBC  (heparin)  Once,   R    Comments:  Baseline for heparin therapy IF NOT ALREADY DRAWN.  Notify MD if PLT < 100 K.    Signed and Held   Signed and Held  Creatinine, serum  (heparin)  Once,   R    Comments:  Baseline for heparin therapy IF NOT ALREADY DRAWN.    Signed and Held   Signed and Held  Magnesium  Once,   R     Signed and Held   Signed and Held  Phosphorus  Once,   R     Signed and Held   Signed and [employer], sputum-assessment  Once,   R     Signed and Held   Signed and Held  Urinalysis, Routine w reflex microscopic  Once,   R     Signed and Held   Visual merchandiser and Licensed conveyancer morning,   R     Signed and Held   Signed and Held  CBC  Tomorrow morning,   R     Signed and Held   Signed and Held  MRSA PCR Screening  Once,   R     Signed and Held          Vitals/Pain Today's Vitals   03/27/19 1700 03/27/19 1739 03/27/19 1745 03/27/19  1830  BP:  139/73 136/77 (!) 124/49  Pulse: (!) 101 (!) 108 (!) 107 (!) 102  Resp: 19 (!) 22 (!) 21 18  Temp:      TempSrc:      SpO2: (!) 84% (!) 82% 92% (!) 83%  Weight:      Height:      PainSc:        Isolation Precautions Airborne and Contact precautions  Medications Medications  ceFEPIme (MAXIPIME) 1 g in sodium chloride 0.9 % 100 mL IVPB (has no administration in time range)  ceFEPIme (MAXIPIME) 2 g in sodium chloride 0.9 % 100 mL IVPB (0 g Intravenous Stopped 03/27/19 1739)  vancomycin (VANCOCIN) 1,500 mg in sodium chloride 0.9 % 500 mL IVPB (1,500 mg Intravenous New Bag/Given 03/27/19 1610)  methylPREDNISolone sodium succinate (SOLU-MEDROL) 125 mg/2 mL injection 125 mg (125 mg Intravenous Given 03/27/19 1542)  albuterol (PROVENTIL HFA;VENTOLIN HFA) 108 (90 Base) MCG/ACT inhaler 4 puff (4 puffs Inhalation Given 03/27/19 1545)  potassium chloride SA (K-DUR,KLOR-CON) CR tablet 20 mEq (20 mEq Oral Given 03/27/19 1834)    Mobility non-ambulatory High fall risk   Focused Assessments Pulmonary Assessment Handoff:  Lung sounds:   O2 Device: NRB        R Recommendations: See Admitting Provider Note  Report given to:   Additional Notes: Pt coming from home, per family pt wears 14L at home and oxygen saturation has been in 40's. Pt alert and oriented. Pt still in 80's on NRB, but alert and conversating.

## 2019-03-27 NOTE — ED Notes (Signed)
CBG 134 

## 2019-03-27 NOTE — ED Notes (Signed)
RN called Olin Hauser, niece, to provide update and notify of admission status.

## 2019-03-27 NOTE — ED Notes (Signed)
Attempted report 

## 2019-03-27 NOTE — ED Notes (Signed)
CONTACT PERSON: [place] (POA/CARE GIVER) on the bench outside the ED                 PHONE NO: [phone]

## 2019-03-27 NOTE — ED Notes (Signed)
RN made contact with family, Olin Hauser number in chart, and family stated pt no longer hospice care as of last week, but pt is DNR/DNI per family. A copy is at bedside and family has original. Family reports pt oxygen saturation was in 40% over weekend and pt has been on 12L of concentrated oxygen. Family made aware disposition to be made once results are in.

## 2019-03-28 DIAGNOSIS — J9601 Acute respiratory failure with hypoxia: Secondary | ICD-10-CM

## 2019-03-28 DIAGNOSIS — R0603 Acute respiratory distress: Secondary | ICD-10-CM

## 2019-03-28 HISTORY — DX: Acute respiratory distress: R06.03

## 2019-03-28 LAB — BASIC METABOLIC PANEL
Anion gap: 15 (ref 5–15)
BUN: 30 mg/dL — ABNORMAL HIGH (ref 8–23)
CO2: 22 mmol/L (ref 22–32)
Calcium: 9.2 mg/dL (ref 8.9–10.3)
Chloride: 100 mmol/L (ref 98–111)
Creatinine, Ser: 2.31 mg/dL — ABNORMAL HIGH (ref 0.44–1.00)
GFR calc Af Amer: 24 mL/min — ABNORMAL LOW (ref >60)
GFR calc non Af Amer: 21 mL/min — ABNORMAL LOW (ref >60)
Glucose, Bld: 158 mg/dL — ABNORMAL HIGH (ref 70–99)
Potassium: 4.2 mmol/L (ref 3.5–5.1)
Sodium: 137 mmol/L (ref 135–145)

## 2019-03-28 LAB — BLOOD GAS, ARTERIAL
Acid-base deficit: 1.8 mmol/L (ref 0.0–2.0)
Bicarbonate: 23.2 mmol/L (ref 20.0–28.0)
Delivery systems: POSITIVE
Drawn by: 27011
Expiratory PAP: 6
FIO2: 80
Inspiratory PAP: 12
O2 Saturation: 86.9 %
Patient temperature: 98.6
pCO2 arterial: 45 mmHg (ref 32.0–48.0)
pH, Arterial: 7.333 — ABNORMAL LOW (ref 7.350–7.450)
pO2, Arterial: 58.5 mmHg — ABNORMAL LOW (ref 83.0–108.0)

## 2019-03-28 LAB — GLUCOSE, CAPILLARY
Glucose-Capillary: 121 mg/dL — ABNORMAL HIGH (ref 70–99)
Glucose-Capillary: 138 mg/dL — ABNORMAL HIGH (ref 70–99)
Glucose-Capillary: 144 mg/dL — ABNORMAL HIGH (ref 70–99)
Glucose-Capillary: 144 mg/dL — ABNORMAL HIGH (ref 70–99)
Glucose-Capillary: 178 mg/dL — ABNORMAL HIGH (ref 70–99)

## 2019-03-28 LAB — CBC
HCT: 39.4 % (ref 36.0–46.0)
Hemoglobin: 11.3 g/dL — ABNORMAL LOW (ref 12.0–15.0)
MCH: 26.1 pg (ref 26.0–34.0)
MCHC: 28.7 g/dL — ABNORMAL LOW (ref 30.0–36.0)
MCV: 91 fL (ref 80.0–100.0)
Platelets: 419 10*3/uL — ABNORMAL HIGH (ref 150–400)
RBC: 4.33 MIL/uL (ref 3.87–5.11)
RDW: 18.8 % — ABNORMAL HIGH (ref 11.5–15.5)
WBC: 5.2 10*3/uL (ref 4.0–10.5)
nRBC: 0 % (ref 0.0–0.2)

## 2019-03-28 LAB — BLOOD CULTURE ID PANEL (REFLEXED)

## 2019-03-28 LAB — URINE CULTURE: Culture: NO GROWTH

## 2019-03-28 LAB — EXPECTORATED SPUTUM ASSESSMENT W GRAM STAIN, RFLX TO RESP C

## 2019-03-28 LAB — MRSA PCR SCREENING: MRSA by PCR: POSITIVE — AB

## 2019-03-28 LAB — EXPECTORATED SPUTUM ASSESSMENT W REFEX TO RESP CULTURE

## 2019-03-28 MED ORDER — FUROSEMIDE 10 MG/ML IJ SOLN: 80.0000 mg | Freq: Two times a day (BID) | INTRAMUSCULAR | Status: DC

## 2019-03-28 MED ORDER — FUROSEMIDE 10 MG/ML IJ SOLN: 120.0000 mg | Freq: Two times a day (BID) | INTRAVENOUS | Status: DC

## 2019-03-28 MED ORDER — CHLORHEXIDINE GLUCONATE CLOTH 2 % EX PADS
6.0000 | MEDICATED_PAD | Freq: Every day | CUTANEOUS | Status: DC
  Administered 2019-03-29 – 2019-04-03 (×6): 6 via TOPICAL

## 2019-03-28 MED ORDER — FUROSEMIDE 10 MG/ML IJ SOLN
120.0000 mg | Freq: Two times a day (BID) | INTRAVENOUS | Status: DC
  Filled 2019-03-28 (×3): qty 12

## 2019-03-28 MED ORDER — MUPIROCIN 2 % EX OINT
TOPICAL_OINTMENT | Freq: Two times a day (BID) | CUTANEOUS | Status: DC
  Administered 2019-03-28 – 2019-04-03 (×13): via NASAL
  Filled 2019-03-28 (×3): qty 22

## 2019-03-28 MED ORDER — FUROSEMIDE 10 MG/ML IJ SOLN
120.0000 mg | Freq: Two times a day (BID) | INTRAVENOUS | Status: DC
  Administered 2019-03-28 – 2019-04-01 (×9): 120 mg via INTRAVENOUS
  Filled 2019-03-28 (×2): qty 10
  Filled 2019-03-28: qty 12
  Filled 2019-03-28: qty 10
  Filled 2019-03-28: qty 2
  Filled 2019-03-28: qty 10
  Filled 2019-03-28: qty 12
  Filled 2019-03-28 (×2): qty 10
  Filled 2019-03-28: qty 2
  Filled 2019-03-28: qty 10

## 2019-03-28 MED ORDER — VANCOMYCIN HCL 1000 MG IV SOLR
1000.0000 mg | INTRAVENOUS | Status: DC
  Administered 2019-03-29 (×2): 1000 mg via INTRAVENOUS
  Filled 2019-03-28 (×3): qty 1000

## 2019-03-28 MED ORDER — FUROSEMIDE 10 MG/ML IJ SOLN: 80.0000 mg | Freq: Three times a day (TID) | INTRAMUSCULAR | Status: DC

## 2019-03-28 MED ORDER — ALBUTEROL SULFATE (2.5 MG/3ML) 0.083% IN NEBU: 2.5000 mg | INHALATION_SOLUTION | RESPIRATORY_TRACT | Status: DC | PRN

## 2019-03-28 NOTE — Progress Notes (Signed)
PHARMACY - PHYSICIAN COMMUNICATION CRITICAL VALUE ALERT - BLOOD CULTURE IDENTIFICATION (BCID)  Elizabeth Thompson is an 68 y.o. female who presented to [facility] on 03/27/2019 with a chief complaint of worsening SOB.  Assessment:  Suspected PNA. BCx result likely from contaminant but is on appropriate therapy for now.  Name of physician (or Provider) Contacted: n/a  Current antibiotics: vancomycin + cefepime  Changes to prescribed antibiotics recommended:  Patient is on recommended antibiotics - No changes needed  Results for orders placed or performed during the hospital encounter of 03/27/19  Blood Culture ID Panel (Reflexed) (Collected: 03/27/2019  3:38 PM)  Result Value Ref Range   Enterococcus species NOT DETECTED NOT DETECTED   Listeria monocytogenes NOT DETECTED NOT DETECTED   Staphylococcus species DETECTED (A) NOT DETECTED   Staphylococcus aureus (BCID) NOT DETECTED NOT DETECTED   Methicillin resistance DETECTED (A) NOT DETECTED   Streptococcus species NOT DETECTED NOT DETECTED   Streptococcus agalactiae NOT DETECTED NOT DETECTED   Streptococcus pneumoniae NOT DETECTED NOT DETECTED   Streptococcus pyogenes NOT DETECTED NOT DETECTED   Acinetobacter baumannii NOT DETECTED NOT DETECTED   Enterobacteriaceae species NOT DETECTED NOT DETECTED   Enterobacter cloacae complex NOT DETECTED NOT DETECTED   Escherichia coli NOT DETECTED NOT DETECTED   Klebsiella oxytoca NOT DETECTED NOT DETECTED   Klebsiella pneumoniae NOT DETECTED NOT DETECTED   Proteus species NOT DETECTED NOT DETECTED   Serratia marcescens NOT DETECTED NOT DETECTED   Haemophilus influenzae NOT DETECTED NOT DETECTED   Neisseria meningitidis NOT DETECTED NOT DETECTED   Pseudomonas aeruginosa NOT DETECTED NOT DETECTED   Candida albicans NOT DETECTED NOT DETECTED   Candida glabrata NOT DETECTED NOT DETECTED   Candida krusei NOT DETECTED NOT DETECTED   Candida parapsilosis NOT DETECTED NOT DETECTED   Candida  tropicalis NOT DETECTED NOT DETECTED    Renold Genta, PharmD, BCPS Clinical Pharmacist Clinical phone for 03/28/2019 until 10p is [phone] 03/28/2019 6:30 PM

## 2019-03-28 NOTE — Progress Notes (Signed)
Pharmacy Antibiotic Note  Elizabeth Thompson is a 68 y.o. female admitted on 03/27/2019 with pneumonia.  Pharmacy has been consulted for vancomycin dosing.  Patient currently afebrile, wbc 5, scr 2.3.  Vancomycin 1000 mg IV Q 24 hrs. Goal AUC 400-550. Expected AUC: 503 SCr used: 2.3   Plan: Vancomycin 1000 IV every 24 hours.  Goal trough 15-20 mcg/mL.  Height: 5' 3.25" (160.7 cm) Weight: 194 lb 7.1 oz (88.2 kg) IBW/kg (Calculated) : 52.98  Temp (24hrs), Avg:98.5 F (36.9 C), Min:97.6 F (36.4 C), Max:99.3 F (37.4 C)  Recent Labs  Lab 03/27/19 1531 03/28/19 0453  WBC 7.7 5.2  CREATININE 1.99* 2.31*  LATICACIDVEN 1.7  --     Estimated Creatinine Clearance: 24.7 mL/min (A) (by C-G formula based on SCr of 2.31 mg/dL (H)).    Allergies  Allergen Reactions  . Codeine Nausea And Vomiting  . Nitrostat [Nitroglycerin] Other (See Comments)    Pt is on revatio  . Prednisone Other (See Comments)    Has to monitor because she is a diabetic      Thank you for allowing pharmacy to be a part of this patient's care.  Erin Hearing PharmD., BCPS Clinical Pharmacist 03/28/2019 4:11 PM

## 2019-03-28 NOTE — Progress Notes (Signed)
RT called due to pt desaturation. RT arrived with sats in the 30s and dropped as low as the mid 20s. RT placed bipap and increased pressures to allow pt to recover.  Pt recovered quickly and complained of pressure being too high. RT dropped pressure to 12/6 and 100% at this time. RT will continue to monitor.

## 2019-03-28 NOTE — Progress Notes (Signed)
Pt desat to 60s after being placed on high flow cannula @ 15 liters, placed back on non rebreather @ 15 liters, RT notified of changes, slow recovery to high 80s low 90s, other VS remain stable, will continue to monitor closely.

## 2019-03-28 NOTE — TOC Initial Note (Signed)
Transition of Care [facility]) - Initial/Assessment Note    Patient Details  Name: Elizabeth Thompson MRN: [id number] Date of Birth: 1951/02/24  Transition of [facility]) CM/SW Contact:    Bethena Roys, RN Phone Number: 03/28/2019, 3:07 PM  Clinical Narrative:   CM completed readmission high risk screening. Pt presented for SOB- DNR from home with Niece- [name]. Pt initially active with [facility] and Palliative Care- pt is now receiving Palliative services. CM did reach out to MD and asked for Palliative Consult. Pt remains on high flow 02. CM will continue to monitor for transition of care needs.               Expected Discharge Plan: Home w Hospice Care Barriers to Discharge: Continued Medical Work up   Patient Goals and CMS Choice Patient states their goals for this hospitalization and ongoing recovery are:: "to get back home with family"      Expected Discharge Plan and Services Expected Discharge Plan: Home w Hospice Care In-house Referral: Hospice / Palliative Care(CM asked for Palliative Consult 03-28-19) Discharge Planning Services: CM Consult   Living arrangements for the past 2 months: [place]                  Prior Living Arrangements/Services Living arrangements for the past 2 months: Single Family Home Lives with:: Relatives Patient language and need for interpreter reviewed:: Yes Do you feel safe going back to the place where you live?: Yes      Need for Family Participation in Patient Care: Yes (Comment) Care giver support system in place?: Yes (comment)   Criminal Activity/Legal Involvement Pertinent to Current Situation/Hospitalization: No - Comment as needed  Activities of Daily Living      Permission Sought/Granted Permission sought to share information with : Family Supports Permission granted to share information with : Yes, Verbal Permission Granted  Share Information with NAME: Ennis Forts  (Niece)         Emotional Assessment Appearance:: Appears stated age Attitude/Demeanor/Rapport: Engaged Affect (typically observed): Accepting Orientation: : Oriented to Self, Oriented to Place, Oriented to  Time, Oriented to Situation Alcohol / Substance Use: Not Applicable Psych Involvement: No (comment)  Admission diagnosis:  Acute respiratory failure with hypoxia ([place]) [J96.01] Patient Active Problem List   Diagnosis Date Noted  . Cor pulmonale, chronic ([place])   . Acute on chronic right-sided heart failure ([place])   . Hyponatremia with excess extracellular fluid volume   . Goals of care, counseling/discussion   . Palliative care by specialist   . OSA (obstructive sleep apnea)   . AKI (acute kidney injury) ([place]) 01/03/2019  . Acute respiratory failure ([place]) 01/03/2019  . Hypertension 07/02/2017  . Chronic respiratory failure with hypoxia, on home O2 therapy ([place]) 07/02/2017  . COPD (chronic obstructive pulmonary disease) ([place]) 07/02/2017  . Diabetes mellitus without complication ([place]) 09/62/8366  . Pulmonary hypertension ([place]) 07/02/2017  . Bipolar disorder ([place]) 07/02/2017  . Hyperlipidemia 07/02/2017  . Acute hyponatremia 07/02/2017  . Acute metabolic encephalopathy 29/47/6546  . CKD (chronic kidney disease) stage 3, GFR 30-59 ml/min (HCC) 07/02/2017  . Elevated troponin   . Cor pulmonale, acute ([place])   . Acute on chronic respiratory failure ([place]) 06/09/2017  . Chronic renal disease, stage III ([place]) 05/05/2017  . Chronic respiratory failure with hypoxia ([place]) 05/05/2017  . Renal cell carcinoma, left ([place]) 02/05/2017  . Dyspnea 12/28/2016  . Depression 09/04/2016  . Renal malignant tumor ([place]) 06/26/2016  .  GERD (gastroesophageal reflux disease) 03/22/2016  . Insomnia 03/22/2016  . COPD GOLD 0  12/06/2015  . Essential hypertension 12/06/2015  . COPD exacerbation ([place]) 11/20/2015  . Multiple lung nodules 11/20/2015  . Morbid obesity due to excess calories ([place]) 11/20/2015  .  Hypersomnia with sleep apnea 11/20/2015  . Diabetes mellitus type 2, controlled, without complications ([place]) 75/44/9201  . HLD (hyperlipidemia) 11/06/2015  . Chronic low back pain 05/09/2015  . Gait disorder 05/09/2015  . Memory disorder 09/05/2014  . Schizoaffective disorder ([place]) 08/18/2014   PCP:  Everardo Beals, NP Pharmacy:   [facility] [phone] [address], [place] [address] Phone: [phone] Fax: [phone]  [facility] [place], [place] - [address] [place] [address] Phone: [phone] Fax: [phone]     Social Determinants of Health (SDOH) Interventions    Readmission Risk Interventions No flowsheet data found.

## 2019-03-28 NOTE — Progress Notes (Addendum)
PROGRESS NOTE    Elizabeth Thompson  FWY:[id number] DOB: 08-20-1951 DOA: 03/27/2019 PCP: Everardo Beals, NP   Brief Narrative:  Patient is Elizabeth Thompson 68 year old African-American female with past medical history significant for COPD, severe pulmonary hypertension, chronic respiratory failure on 6 L of oxygen.  Patient has been admitted repeatedly due to acute on chronic respiratory failure.  Other past medical history includes hypertension, hyperlipidemia, tobacco use, GERD, diabetes mellitus without complications and bipolar disorder amongst other medical problems.  Patient was discharged from this hospital on March 09, 2019.  The discharge plan included for the patient to be discharged back home with home hospice.  Apparently, 1 of the patient's care went to see the patient earlier today and find out that the O2 sat was significantly decreased.  The patient is requiring 15 L of supplemental oxygen for now.  Blood gas done revealed pH of 7.39, PCO2 of 43.8 and PO2 of 22 (suspect that this is venous gas).  Cardiac BNP is elevated at 593.2, however, patient serum creatinine is 1.99.  Troponin is 0.03.  Procalcitonin is 0.16.  Chest x-ray and CT reveal borderline cardiomegaly, pulmonary vascular congestion and slightly increased non-specific bibasilar airspace opacities.  Patient be admitted for further assessment and management.  No headache, no neck pain, no URI symptoms, no chest pain, and no productive cough, no GI symptoms and no urinary symptoms.  ED Course: Patient is currently on supplemental oxygen.  Patient has been given nebulizer treatment and IV Solu-Medrol.  ER provider discussed case with the ICU team, and they were advised for the hospitalist team to admit patient.  Assessment & Plan:   Active Problems:   Acute on chronic respiratory failure (HCC)  Acute on Chronic Hypoxic Respiratory Failure: Pt on 6 L at baseline Currently, her sats range from 60's to the 80's on Elizabeth Thompson NRB She confirms DNR  status ABG with pO2 58.5 on BIPAP Suspect this is related to heart failure and pulmonary hypertension.  She's being treated possible COPD exacerbation/pneumonia as well. Start on BIPAP and high dose lasix 120 mg BID Continue solumedrol, prn and scheduled nebs Currently on vanc/cefepime for possible infection, will continue for now Tested negative for novel coronavirus (no fevers, travel, or known sick contacts) Follow respiratory cultures Discussed trial of high dose lasix/BIPAP.  If not improving, will need to consider comfort care/hospice.  Severe pulmonary hypertension: Continue sildenofil OPSUMIT d/c'd by cards at last hospitalization Diuresis as above  COPD: On nebs/steroids as noted above  HFpEF: EF normal in 01/7740, mild diastolic dysfunction On lasix 80 mg daily, aggressive diuresis at this time as noted above  Hypertension: Episode of hypotension was noted earlier Stable now Continue to hold amlodipine Aggressive diuresis as noted above Continue to monitor for now.  Chronic Kidney Disease Stage IV: baseline creatinine appears to be between 2-2.5.  Follow closely with aggressive diuresis as noted above.  Will touch base with renal.    Diabetes mellitus: Continue with sliding scale insulin coverage.  Hypokalemia: Potassium was 3.3. Cautiously repeat. Continue to monitor renal function and electrolytes.  CKD 4: Stable. Continue to monitor.  Goals of care: pt and niece (caregiver) aware of severity of illness.  Sats low on nonrebreather.  Discussed we'd try lasix/bipap and follow.  If pt not improving, would need to discuss transitioning to making her comfortable and trying to get family here with her.  Pt and niece understanding and agreeable.  Consider palliative care c/s as needed.    DVT prophylaxis: heparin  Code Status: DNR - confirmed with pt  Family Communication: discussed with pt niece (she asks me to discuss with renal) Disposition Plan: pending  improvement   Consultants:   Renal   Procedures:   None   Antimicrobials Anti-infectives (From admission, onward)   Start     Dose/Rate Route Frequency Ordered Stop   03/28/19 1500  ceFEPIme (MAXIPIME) 1 g in sodium chloride 0.9 % 100 mL IVPB     1 g 200 mL/hr over 30 Minutes Intravenous Every 24 hours 03/27/19 1817     03/27/19 1600  vancomycin (VANCOCIN) 1,500 mg in sodium chloride 0.9 % 500 mL IVPB     1,500 mg 250 mL/hr over 120 Minutes Intravenous  Once 03/27/19 1531 03/27/19 2033   03/27/19 1530  vancomycin (VANCOCIN) IVPB 1000 mg/200 mL premix  Status:  Discontinued     1,000 mg 200 mL/hr over 60 Minutes Intravenous  Once 03/27/19 1527 03/27/19 1531   03/27/19 1530  ceFEPIme (MAXIPIME) 2 g in sodium chloride 0.9 % 100 mL IVPB     2 g 200 mL/hr over 30 Minutes Intravenous  Once 03/27/19 1527 03/27/19 1739         Subjective: Denies SOB. Confirms DNR status.   Objective: Vitals:   03/28/19 0110 03/28/19 0431 03/28/19 0744 03/28/19 1100  BP:  114/86 119/90   Pulse:  (!) 109 98   Resp:  20 16   Temp:  97.6 F (36.4 C) 98.7 F (37.1 C)   TempSrc:  Axillary Axillary   SpO2: (!) 88% (!) 87% 91% 92%  Weight:      Height:        Intake/Output Summary (Last 24 hours) at 03/28/2019 1141 Last data filed at 03/28/2019 0857 Gross per 24 hour  Intake 840 ml  Output -  Net 840 ml   Filed Weights   03/27/19 1528 03/28/19 0028  Weight: 86.6 kg 88.2 kg    Examination:  General exam: Appears calm and comfortable  Respiratory system: faint wheezes, some crackles at bases, increased work of breathing, but appears more comfortable and alert than I would expect with her O2 sats where they are Cardiovascular system: S1 & S2 heard, RRR. Gastrointestinal system: Abdomen is nondistended, soft and nontender.  Central nervous system: Alert and oriented. No focal neurological deficits. Extremities: trace edema, dependent edema. Skin: No rashes, lesions or ulcers  Psychiatry: Judgement and insight appear normal. Mood & affect appropriate.     Data Reviewed: I have personally reviewed following labs and imaging studies  CBC: Recent Labs  Lab 03/27/19 1524 03/27/19 1531 03/28/19 0453  WBC  --  7.7 5.2  NEUTROABS  --  6.2  --   HGB 12.6 11.9* 11.3*  HCT 37.0 40.2 39.4  MCV  --  92.4 91.0  PLT  --  425* 283*   Basic Metabolic Panel: Recent Labs  Lab 03/27/19 1524 03/27/19 1531 03/27/19 2021 03/28/19 0453  NA 138 139  --  137  K 3.4* 3.3*  --  4.2  CL  --  100  --  100  CO2  --  23  --  22  GLUCOSE  --  150*  --  158*  BUN  --  22  --  30*  CREATININE  --  1.99*  --  2.31*  CALCIUM  --  9.5  --  9.2  MG  --   --  2.1  --   PHOS  --   --  3.8  --  GFR: Estimated Creatinine Clearance: 24.7 mL/min (Elizabeth Thompson) (by C-G formula based on SCr of 2.31 mg/dL (H)). Liver Function Tests: Recent Labs  Lab 03/27/19 1531  AST 20  ALT 12  ALKPHOS 64  BILITOT 0.6  PROT 7.0  ALBUMIN 3.9   No results for input(s): LIPASE, AMYLASE in the last 168 hours. No results for input(s): AMMONIA in the last 168 hours. Coagulation Profile: Recent Labs  Lab 03/27/19 1531  INR 0.9   Cardiac Enzymes: Recent Labs  Lab 03/27/19 1550  TROPONINI 0.03*   BNP (last 3 results) No results for input(s): PROBNP in the last 8760 hours. HbA1C: No results for input(s): HGBA1C in the last 72 hours. CBG: Recent Labs  Lab 03/27/19 2129 03/28/19 0050 03/28/19 0742 03/28/19 1113  GLUCAP 134* 178* 138* 144*   Lipid Profile: No results for input(s): CHOL, HDL, LDLCALC, TRIG, CHOLHDL, LDLDIRECT in the last 72 hours. Thyroid Function Tests: No results for input(s): TSH, T4TOTAL, FREET4, T3FREE, THYROIDAB in the last 72 hours. Anemia Panel: No results for input(s): VITAMINB12, FOLATE, FERRITIN, TIBC, IRON, RETICCTPCT in the last 72 hours. Sepsis Labs: Recent Labs  Lab 03/27/19 1531  PROCALCITON 0.16  LATICACIDVEN 1.7    Recent Results (from the past  240 hour(s))  MRSA PCR Screening     Status: Abnormal   Collection Time: 03/27/19  1:09 AM  Result Value Ref Range Status   MRSA by PCR POSITIVE (Elizabeth Thompson) NEGATIVE Final    Comment: CRITICAL RESULT CALLED TO, READ BACK BY AND VERIFIED WITH: RN,Elizabeth Thompson. Elizabeth Thompson [id number] [id number]  Elizabeth Thompson Performed at [facility] [address] [place], [place] [address]   SARS Coronavirus 2 [facility] order, Performed in [facility] hospital lab)     Status: None   Collection Time: 03/27/19  3:46 PM  Result Value Ref Range Status   SARS Coronavirus 2 NEGATIVE NEGATIVE Final    Comment: (NOTE) If result is NEGATIVE SARS-CoV-2 target nucleic acids are NOT DETECTED. The SARS-CoV-2 RNA is generally detectable in upper and lower  respiratory specimens during the acute phase of infection. The lowest  concentration of SARS-CoV-2 viral copies this assay can detect is 250  copies / mL. Elizabeth Thompson negative result does not preclude SARS-CoV-2 infection  and should not be used as the sole basis for treatment or other  patient management decisions.  Elizabeth Thompson negative result may occur with  improper specimen collection / handling, submission of specimen other  than nasopharyngeal swab, presence of viral mutation(s) within the  areas targeted by this assay, and inadequate number of viral copies  (<250 copies / mL). Elizabeth Thompson negative result must be combined with clinical  observations, patient history, and epidemiological information. If result is POSITIVE SARS-CoV-2 target nucleic acids are DETECTED. The SARS-CoV-2 RNA is generally detectable in upper and lower  respiratory specimens dur ing the acute phase of infection.  Positive  results are indicative of active infection with SARS-CoV-2.  Clinical  correlation with patient history and other diagnostic information is  necessary to determine patient infection status.  Positive results do  not rule out bacterial infection or co-infection with other viruses. If result is PRESUMPTIVE POSTIVE  SARS-CoV-2 nucleic acids MAY BE PRESENT.   Elizabeth Thompson presumptive positive result was obtained on the submitted specimen  and confirmed on repeat testing.  While 2019 novel coronavirus  (SARS-CoV-2) nucleic acids may be present in the submitted sample  additional confirmatory testing may be necessary for epidemiological  and / or clinical management purposes  to differentiate between  SARS-CoV-2 and other Sarbecovirus currently known to infect humans.  If clinically indicated additional testing with an alternate test  methodology [phone]) is advised. The SARS-CoV-2 RNA is generally  detectable in upper and lower respiratory sp ecimens during the acute  phase of infection. The expected result is Negative. Fact Sheet for Patients:  StrictlyIdeas.no Fact Sheet for Healthcare Providers: BankingDealers.co.za This test is not yet approved or cleared by the [place] FDA and has been authorized for detection and/or diagnosis of SARS-CoV-2 by FDA under an Emergency Use Authorization (EUA).  This EUA will remain in effect (meaning this test can be used) for the duration of the COVID-19 declaration under Section 564(b)(1) of the Act, 21 U.S.C. section 360bbb-3(b)(1), unless the authorization is terminated or revoked sooner. Performed at [facility], [place] [address] [place], [place] [address]   Culture, sputum-assessment     Status: None   Collection Time: 03/28/19  3:09 AM  Result Value Ref Range Status   Specimen Description SPUTUM  Final   Special Requests NONE  Final   Sputum evaluation   Final    THIS SPECIMEN IS ACCEPTABLE FOR SPUTUM CULTURE Performed at [facility], [address] [place], [place] [address]    Report Status 03/28/2019 FINAL  Final  Culture, respiratory     Status: None (Preliminary result)   Collection Time: 03/28/19  3:09 AM  Result Value Ref Range Status   Specimen Description SPUTUM  Final   Special  Requests NONE Reflexed from [id number]  Final   Gram Stain   Final    RARE WBC PRESENT, PREDOMINANTLY PMN RARE SQUAMOUS EPITHELIAL CELLS PRESENT FEW GRAM POSITIVE RODS FEW GRAM NEGATIVE COCCOBACILLI Performed at [facility], [place] [address], [place], [place] [address]    Culture PENDING  Incomplete   Report Status PENDING  Incomplete         Radiology Studies: Elizabeth Thompson  Result Date: 03/27/2019 CLINICAL DATA:  68 year old female with hypoxia EXAM: PORTABLE CHEST 1 Thompson COMPARISON:  Prior chest x-ray and CT scan of the chest 02/25/2019 FINDINGS: Stable borderline cardiomegaly. Mediastinal contours are unchanged. Slightly increased pulmonary vascular congestion and nonspecific bibasilar airspace opacities. No evidence of pneumothorax. Evaluation of the lung bases slightly limited by overlying soft tissues. No acute osseous abnormality. IMPRESSION: Borderline cardiomegaly, pulmonary vascular congestion and slightly increased nonspecific bibasilar airspace opacities. Findings may represent mild CHF with dependent edema versus small bilateral posteriorly layering pleural effusions and atelectasis. Bibasilar pneumonia is also Elizabeth Thompson consideration in the appropriate clinical setting but is considered less likely. Electronically Signed   By: Elizabeth Cadet M.D.   On: 03/27/2019 16:07        Scheduled Meds: . ARIPiprazole  20 mg Oral QHS  . aspirin EC  81 mg Oral Daily  . atorvastatin  20 mg Oral QPC supper  . brimonidine  1 drop Both Eyes BID  . budesonide (PULMICORT) nebulizer solution  0.25 mg Nebulization BID  . Chlorhexidine Gluconate Cloth  6 each Topical Q0600  . donepezil  5 mg Oral QHS  . dorzolamide-timolol  1 drop Both Eyes BID  . ferrous sulfate  325 mg Oral QPC supper  . heparin  5,000 Units Subcutaneous Q8H  . insulin aspart  0-5 Units Subcutaneous QHS  . insulin aspart  0-9 Units Subcutaneous TID WC  . ipratropium-albuterol  3 mL Nebulization Q6H  .  lamoTRIgine  100 mg Oral QPC supper  . latanoprost  1  drop Both Eyes QHS  . methylPREDNISolone (SOLU-MEDROL) injection  40 mg Intravenous Q6H  . mupirocin ointment   Nasal BID  . sildenafil  20 mg Oral TID   Continuous Infusions: . ceFEPime (MAXIPIME) IV    . furosemide       LOS: 1 day    Time spent: over 30 min 35 min critical care time for acute hypoxic respiratory failure     Fayrene Helper, MD [facility] Pager AMION  If 7PM-7AM, please contact night-coverage [email or web address] Password [facility] 03/28/2019, 11:41 AM

## 2019-03-28 NOTE — Progress Notes (Signed)
[facility] Hospice and Palliative Care  Ms. Phung was under hospice services until 4/7.  She then was to transfer to our palliative program in the home to continue pursuing active treatments outside of the hospice plan of care.  Pt has not had her initial visit with our palliative services yet.  Updated the [facility] [place] palliative team, we are waiting for an order to begin palliative services from her community provider.  Will continue to follow to assist if needed until she returns home.    Thank you, Venia Carbon RN, BSN, [facility] Liaison [phone] (main #)

## 2019-03-29 ENCOUNTER — Inpatient Hospital Stay (HOSPITAL_COMMUNITY): Payer: Medicare Other

## 2019-03-29 DIAGNOSIS — J441 Chronic obstructive pulmonary disease with (acute) exacerbation: Principal | ICD-10-CM

## 2019-03-29 DIAGNOSIS — Z7189 Other specified counseling: Secondary | ICD-10-CM

## 2019-03-29 DIAGNOSIS — I272 Pulmonary hypertension, unspecified: Secondary | ICD-10-CM

## 2019-03-29 DIAGNOSIS — E119 Type 2 diabetes mellitus without complications: Secondary | ICD-10-CM

## 2019-03-29 DIAGNOSIS — Z515 Encounter for palliative care: Secondary | ICD-10-CM

## 2019-03-29 DIAGNOSIS — N184 Chronic kidney disease, stage 4 (severe): Secondary | ICD-10-CM

## 2019-03-29 LAB — COMPREHENSIVE METABOLIC PANEL
ALT: 18 U/L (ref 0–44)
AST: 22 U/L (ref 15–41)
Albumin: 3.7 g/dL (ref 3.5–5.0)
Alkaline Phosphatase: 57 U/L (ref 38–126)
Anion gap: 17 — ABNORMAL HIGH (ref 5–15)
BUN: 46 mg/dL — ABNORMAL HIGH (ref 8–23)
CO2: 20 mmol/L — ABNORMAL LOW (ref 22–32)
Calcium: 9.1 mg/dL (ref 8.9–10.3)
Chloride: 102 mmol/L (ref 98–111)
Creatinine, Ser: 2.48 mg/dL — ABNORMAL HIGH (ref 0.44–1.00)
GFR calc Af Amer: 22 mL/min — ABNORMAL LOW (ref >60)
GFR calc non Af Amer: 19 mL/min — ABNORMAL LOW (ref >60)
Glucose, Bld: 139 mg/dL — ABNORMAL HIGH (ref 70–99)
Potassium: 4.2 mmol/L (ref 3.5–5.1)
Sodium: 139 mmol/L (ref 135–145)
Total Bilirubin: 0.9 mg/dL (ref 0.3–1.2)
Total Protein: 6.8 g/dL (ref 6.5–8.1)

## 2019-03-29 LAB — CBC
HCT: 35.9 % — ABNORMAL LOW (ref 36.0–46.0)
Hemoglobin: 10.9 g/dL — ABNORMAL LOW (ref 12.0–15.0)
MCH: 27.3 pg (ref 26.0–34.0)
MCHC: 30.4 g/dL (ref 30.0–36.0)
MCV: 89.8 fL (ref 80.0–100.0)
Platelets: 441 10*3/uL — ABNORMAL HIGH (ref 150–400)
RBC: 4 MIL/uL (ref 3.87–5.11)
RDW: 18.9 % — ABNORMAL HIGH (ref 11.5–15.5)
WBC: 4.3 10*3/uL (ref 4.0–10.5)
nRBC: 0 % (ref 0.0–0.2)

## 2019-03-29 LAB — GLUCOSE, CAPILLARY
Glucose-Capillary: 155 mg/dL — ABNORMAL HIGH (ref 70–99)
Glucose-Capillary: 147 mg/dL — ABNORMAL HIGH (ref 70–99)
Glucose-Capillary: 137 mg/dL — ABNORMAL HIGH (ref 70–99)
Glucose-Capillary: 119 mg/dL — ABNORMAL HIGH (ref 70–99)

## 2019-03-29 LAB — CULTURE, BLOOD (ROUTINE X 2): Special Requests: ADEQUATE

## 2019-03-29 LAB — MAGNESIUM: Magnesium: 2.3 mg/dL (ref 1.7–2.4)

## 2019-03-29 NOTE — Plan of Care (Signed)
  Problem: Clinical Measurements: Goal: Cardiovascular complication will be avoided Outcome: Progressing Note:  No s/s of cardiovascular complication noted.   Problem: Coping: Goal: Level of anxiety will decrease Outcome: Progressing Note:  No s/s of anxiety noted.   Problem: Elimination: Goal: Will not experience complications related to urinary retention Outcome: Progressing Note:  No s/s of urinary retention noted.  Adequate urine output via external catheter.

## 2019-03-29 NOTE — Progress Notes (Signed)
Took patient off BiPAP with RN at bedside placed patient on HFNC 15L patient dropped to 77%.  Patient placed back on BiPAP 80% FiO2 Patient sats 94%.

## 2019-03-29 NOTE — Progress Notes (Addendum)
PROGRESS NOTE  Elizabeth Thompson:[id number] DOB: [birth year] DOA: 03/27/2019 PCP: Everardo Beals, NP  Brief Narrative:  Patient is a 68 year old African-American female with past medical history significant for COPD, severe pulmonary hypertension, chronic respiratory failure on 6 L of oxygen. Patient has been admitted repeatedly due to acute on chronic respiratory failure. Other past medical history includes hypertension, hyperlipidemia, tobacco use, GERD, diabetes mellitus without complications and bipolar disorder amongstother medical problems. Patient was discharged from this hospital on March 09, 2019. The discharge plan included for the patient to be discharged back home with home hospice. Apparently, 1 of the patient's care went to see the patient earlier today and find out that the O2 sat was significantly decreased. The patient is requiring 15 L of supplemental oxygen for now. Blood gas done revealed pH of 7.39, PCO2 of 43.8 and PO2 of 22 (suspect that this is venous gas). Cardiac BNP is elevated at 593.2, however, patient serum creatinine is 1.99. Troponin is 0.03. Procalcitonin is 0.16. Chest x-ray and CT reveal borderline cardiomegaly, pulmonary vascular congestion and slightly increased non-specific bibasilar airspace opacities. Patient be admitted for further assessment and management. No headache, no neck pain, no URI symptoms, no chest pain, and no productive cough, no GI symptoms and no urinary symptoms.  ED Course:Patient is currently on supplemental oxygen. Patient has been given nebulizer treatment and IV Solu-Medrol. ER provider discussed case with the ICU team, and they were advised for the hospitalist team to admit patient.    HPI/Recap of past 24 hours:  On bipap with 100% o2 supplement, urine output not measured,  bp/heart rate stable, she is alert and oriented x3, she denies pain, no edema, no fever She is wondering when she can be off bipap, she  wants to eat,   Assessment/Plan: Active Problems:   Acute on chronic respiratory failure (HCC)  Acute on Chronic Hypoxic Respiratory Failure: Pt on 6 L at baseline She is started on bipap due to sats range from 60's to the 80's on a NRB Suspect this is related to heart failure and pulmonary hypertension.  She's being treated possible COPD exacerbation/pneumonia as well. high dose lasix 120 mg BID Continue solumedrol, prn and scheduled nebs Currently on vanc/cefepime for possible infection, will continue for now Tested negative for novel coronavirus (no fevers, travel, or known sick contacts) Follow respiratory cultures, blood culture 1/2 coag negative staph, likely contamination, negative covid 19 test on 4/13 , I talked to [facility] over the phone this am and Discussed trial of high dose lasix/BIPAP.  If not improving, will need to consider comfort care/hospice. Palliative care consulted  Hypokalemia k normalized, monitor k while on lasix  Severe pulmonary hypertension: Continue sildenofil OPSUMIT d/c'd by cards at last hospitalization Diuresis as above  COPD: On nebs/steroids as noted above  HFpEF: EF normal in 03/4009, mild diastolic dysfunction On lasix 80 mg daily, aggressive diuresis at this time as noted above  Hypertension: Episode of hypotension was noted earlier Stable now Continue to hold amlodipine Aggressive diuresis as noted above Continue to monitor for now.  noninsulin dependent dm2, controlled Home meds amaryl/ metfromin held , recent a1c in 02/2019 was 6.5 On ssi here  Chronic Kidney Disease Stage IV: baseline creatinine appears to be between 2-2.5.  Follow closely with aggressive diuresis as noted above.  Anemia of chronic disease -hgb 10.9   Psych: she is stable on abilify, lamictal , aricept continued. She is aaox3, she can tell me her niece's phone number  Obesity: Body mass index is 34.17 kg/m.   Code Status: DNR  Family Communication:  patient and HPOA over the phone  Disposition Plan: not ready to discharge, poor prognosis   Consultants:  Palliative care  Procedures:  bipap  Antibiotics:  vanc/cefepime   Objective: BP 121/77 (BP Location: Right Leg)   Pulse 86   Temp 97.6 F (36.4 C)   Resp 18   Ht 5' 3.25" (1.607 m)   Wt 88.2 kg   SpO2 92%   BMI 34.17 kg/m   Intake/Output Summary (Last 24 hours) at 03/29/2019 0753 Last data filed at 03/29/2019 0300 Gross per 24 hour  Intake 993.07 ml  Output -  Net 993.07 ml   Filed Weights   03/27/19 1528 03/28/19 0028  Weight: 86.6 kg 88.2 kg    Exam: Patient is examined daily including today on 03/29/2019, exams remain the same as of yesterday except that has changed    General:  NAD, on bipap, aaox3  Cardiovascular: RRR  Respiratory: diminished, no wheezing, no rales, no rhonchi  Abdomen: Soft/ND/NT, positive BS  Musculoskeletal: No Edema  Neuro: alert, oriented   Data Reviewed: Basic Metabolic Panel: Recent Labs  Lab 03/27/19 1524 03/27/19 1531 03/27/19 2021 03/28/19 0453 03/29/19 0513  NA 138 139  --  137 139  K 3.4* 3.3*  --  4.2 4.2  CL  --  100  --  100 102  CO2  --  23  --  22 20*  GLUCOSE  --  150*  --  158* 139*  BUN  --  22  --  30* 46*  CREATININE  --  1.99*  --  2.31* 2.48*  CALCIUM  --  9.5  --  9.2 9.1  MG  --   --  2.1  --  2.3  PHOS  --   --  3.8  --   --    Liver Function Tests: Recent Labs  Lab 03/27/19 1531 03/29/19 0513  AST 20 22  ALT 12 18  ALKPHOS 64 57  BILITOT 0.6 0.9  PROT 7.0 6.8  ALBUMIN 3.9 3.7   No results for input(s): LIPASE, AMYLASE in the last 168 hours. No results for input(s): AMMONIA in the last 168 hours. CBC: Recent Labs  Lab 03/27/19 1524 03/27/19 1531 03/28/19 0453 03/29/19 0513  WBC  --  7.7 5.2 4.3  NEUTROABS  --  6.2  --   --   HGB 12.6 11.9* 11.3* 10.9*  HCT 37.0 40.2 39.4 35.9*  MCV  --  92.4 91.0 89.8  PLT  --  425* 419* 441*   Cardiac Enzymes:   Recent Labs   Lab 03/27/19 1550  TROPONINI 0.03*   BNP (last 3 results) Recent Labs    02/24/19 1150 02/26/19 0632 03/27/19 1531  BNP 371.7* 547.8* 593.2*    ProBNP (last 3 results) No results for input(s): PROBNP in the last 8760 hours.  CBG: Recent Labs  Lab 03/28/19 0050 03/28/19 0742 03/28/19 1113 03/28/19 1646 03/28/19 2132  GLUCAP 178* 138* 144* 121* 144*    Recent Results (from the past 240 hour(s))  MRSA PCR Screening     Status: Abnormal   Collection Time: 03/27/19  1:09 AM  Result Value Ref Range Status   MRSA by PCR POSITIVE (A) NEGATIVE Final    Comment: CRITICAL RESULT CALLED TO, READ BACK BY AND VERIFIED WITH: RN,A. Simone Curia [id number]  THANEY Performed at [facility] [address] [place], [place]  [address]   Blood Culture (routine x 2)     Status: None (Preliminary result)   Collection Time: 03/27/19  3:38 PM  Result Value Ref Range Status   Specimen Description BLOOD RIGHT FOREARM  Final   Special Requests   Final    BOTTLES DRAWN AEROBIC AND ANAEROBIC Blood Culture adequate volume   Culture  Setup Time   Final    GRAM POSITIVE COCCI IN CLUSTERS AEROBIC BOTTLE ONLY Organism ID to follow CRITICAL RESULT CALLED TO, READ BACK BY AND VERIFIED WITH:  Alvira Monday [facility] 03/28/19 1751 JDW Performed at [facility], [place] [address] [place], [place] [address]    Culture GRAM POSITIVE COCCI  Final   Report Status PENDING  Incomplete  Blood Culture ID Panel (Reflexed)     Status: Abnormal   Collection Time: 03/27/19  3:38 PM  Result Value Ref Range Status   Enterococcus species NOT DETECTED NOT DETECTED Final   Listeria monocytogenes NOT DETECTED NOT DETECTED Final   Staphylococcus species DETECTED (A) NOT DETECTED Final    Comment: Methicillin (oxacillin) resistant coagulase negative staphylococcus. Possible blood culture contaminant (unless isolated from more than one blood culture draw or clinical case suggests pathogenicity). No antibiotic treatment  is indicated for blood  culture contaminants. CRITICAL RESULT CALLED TO, READ BACK BY AND VERIFIED WITH: J Follett PHARMD 03/28/19 1751 JDW    Staphylococcus aureus (BCID) NOT DETECTED NOT DETECTED Final   Methicillin resistance DETECTED (A) NOT DETECTED Final    Comment: CRITICAL RESULT CALLED TO, READ BACK BY AND VERIFIED WITH: J [place] PHARMD 03/28/19 1751 JDW    Streptococcus species NOT DETECTED NOT DETECTED Final   Streptococcus agalactiae NOT DETECTED NOT DETECTED Final   Streptococcus pneumoniae NOT DETECTED NOT DETECTED Final   Streptococcus pyogenes NOT DETECTED NOT DETECTED Final   Acinetobacter baumannii NOT DETECTED NOT DETECTED Final   Enterobacteriaceae species NOT DETECTED NOT DETECTED Final   Enterobacter cloacae complex NOT DETECTED NOT DETECTED Final   Escherichia coli NOT DETECTED NOT DETECTED Final   Klebsiella oxytoca NOT DETECTED NOT DETECTED Final   Klebsiella pneumoniae NOT DETECTED NOT DETECTED Final   Proteus species NOT DETECTED NOT DETECTED Final   Serratia marcescens NOT DETECTED NOT DETECTED Final   Haemophilus influenzae NOT DETECTED NOT DETECTED Final   Neisseria meningitidis NOT DETECTED NOT DETECTED Final   Pseudomonas aeruginosa NOT DETECTED NOT DETECTED Final   Candida albicans NOT DETECTED NOT DETECTED Final   Candida glabrata NOT DETECTED NOT DETECTED Final   Candida krusei NOT DETECTED NOT DETECTED Final   Candida parapsilosis NOT DETECTED NOT DETECTED Final   Candida tropicalis NOT DETECTED NOT DETECTED Final    Comment: Performed at [facility], [place]. [address] [place], [place] [address]  Blood Culture (routine x 2)     Status: None (Preliminary result)   Collection Time: 03/27/19  3:39 PM  Result Value Ref Range Status   Specimen Description BLOOD RIGHT ANTECUBITAL  Final   Special Requests   Final    BOTTLES DRAWN AEROBIC ONLY Blood Culture results may not be optimal due to an inadequate volume of blood received in culture bottles    Culture   Final    NO GROWTH < 24 HOURS Performed at [facility], [place] [address], [place], [place] [address]    Report Status PENDING  Incomplete  SARS Coronavirus 2 [facility] order, Performed in [facility] hospital lab)     Status: None  Collection Time: 03/27/19  3:46 PM  Result Value Ref Range Status   SARS Coronavirus 2 NEGATIVE NEGATIVE Final    Comment: (NOTE) If result is NEGATIVE SARS-CoV-2 target nucleic acids are NOT DETECTED. The SARS-CoV-2 RNA is generally detectable in upper and lower  respiratory specimens during the acute phase of infection. The lowest  concentration of SARS-CoV-2 viral copies this assay can detect is 250  copies / mL. A negative result does not preclude SARS-CoV-2 infection  and should not be used as the sole basis for treatment or other  patient management decisions.  A negative result may occur with  improper specimen collection / handling, submission of specimen other  than nasopharyngeal swab, presence of viral mutation(s) within the  areas targeted by this assay, and inadequate number of viral copies  (<250 copies / mL). A negative result must be combined with clinical  observations, patient history, and epidemiological information. If result is POSITIVE SARS-CoV-2 target nucleic acids are DETECTED. The SARS-CoV-2 RNA is generally detectable in upper and lower  respiratory specimens dur ing the acute phase of infection.  Positive  results are indicative of active infection with SARS-CoV-2.  Clinical  correlation with patient history and other diagnostic information is  necessary to determine patient infection status.  Positive results do  not rule out bacterial infection or co-infection with other viruses. If result is PRESUMPTIVE POSTIVE SARS-CoV-2 nucleic acids MAY BE PRESENT.   A presumptive positive result was obtained on the submitted specimen  and confirmed on repeat testing.  While 2019 novel coronavirus  (SARS-CoV-2) nucleic  acids may be present in the submitted sample  additional confirmatory testing may be necessary for epidemiological  and / or clinical management purposes  to differentiate between  SARS-CoV-2 and other Sarbecovirus currently known to infect humans.  If clinically indicated additional testing with an alternate test  methodology [phone]) is advised. The SARS-CoV-2 RNA is generally  detectable in upper and lower respiratory sp ecimens during the acute  phase of infection. The expected result is Negative. Fact Sheet for Patients:  StrictlyIdeas.no Fact Sheet for Healthcare Providers: BankingDealers.co.za This test is not yet approved or cleared by the [place] FDA and has been authorized for detection and/or diagnosis of SARS-CoV-2 by FDA under an Emergency Use Authorization (EUA).  This EUA will remain in effect (meaning this test can be used) for the duration of the COVID-19 declaration under Section 564(b)(1) of the Act, 21 U.S.C. section 360bbb-3(b)(1), unless the authorization is terminated or revoked sooner. Performed at [facility], [place] [address] [place], [place] [address]   Urine culture     Status: None   Collection Time: 03/27/19  4:18 PM  Result Value Ref Range Status   Specimen Description URINE, RANDOM  Final   Special Requests NONE  Final   Culture   Final    NO GROWTH Performed at [facility], [place] [address], [place], [place] [address]    Report Status 03/28/2019 FINAL  Final  Culture, sputum-assessment     Status: None   Collection Time: 03/28/19  3:09 AM  Result Value Ref Range Status   Specimen Description SPUTUM  Final   Special Requests NONE  Final   Sputum evaluation   Final    THIS SPECIMEN IS ACCEPTABLE FOR SPUTUM CULTURE Performed at [facility], [place] [address] [place], [place] [address]    Report Status 03/28/2019 FINAL  Final  Culture, respiratory     Status:  None (Preliminary  result)   Collection Time: 03/28/19  3:09 AM  Result Value Ref Range Status   Specimen Description SPUTUM  Final   Special Requests NONE Reflexed from [id number]  Final   Gram Stain   Final    RARE WBC PRESENT, PREDOMINANTLY PMN RARE SQUAMOUS EPITHELIAL CELLS PRESENT FEW GRAM POSITIVE RODS FEW GRAM NEGATIVE COCCOBACILLI    Culture   Final    CULTURE REINCUBATED FOR BETTER GROWTH Performed at [facility], [place] [address], [place], [place] [address]    Report Status PENDING  Incomplete     Studies: No results found.  Scheduled Meds: . ARIPiprazole  20 mg Oral QHS  . aspirin EC  81 mg Oral Daily  . atorvastatin  20 mg Oral QPC supper  . brimonidine  1 drop Both Eyes BID  . budesonide (PULMICORT) nebulizer solution  0.25 mg Nebulization BID  . Chlorhexidine Gluconate Cloth  6 each Topical Q0600  . donepezil  5 mg Oral QHS  . dorzolamide-timolol  1 drop Both Eyes BID  . ferrous sulfate  325 mg Oral QPC supper  . heparin  5,000 Units Subcutaneous Q8H  . insulin aspart  0-5 Units Subcutaneous QHS  . insulin aspart  0-9 Units Subcutaneous TID WC  . ipratropium-albuterol  3 mL Nebulization Q6H  . lamoTRIgine  100 mg Oral QPC supper  . latanoprost  1 drop Both Eyes QHS  . methylPREDNISolone (SOLU-MEDROL) injection  40 mg Intravenous Q6H  . mupirocin ointment   Nasal BID  . sildenafil  20 mg Oral TID    Continuous Infusions: . ceFEPime (MAXIPIME) IV Stopped (03/28/19 1523)  . furosemide Stopped (03/28/19 1820)  . vancomycin Stopped (03/29/19 0121)     Time spent: 64mns, case discussed with palliative care over the phone I have personally reviewed and interpreted on  03/29/2019 daily labs, tele strips, imagings as discussed above under date review session and assessment and plans.  I reviewed all nursing notes, pharmacy notes, consultant notes,  vitals, pertinent old records  I have discussed plan of care as described above with RN , patient and family on 03/29/2019    FFlorencia ReasonsMD, PhD  [facility] Pager [phone] If 7PM-7AM, please contact night-coverage at [email or web address], password [email or web address]4/15/2020, 7:53 AM  LOS: 2 days

## 2019-03-29 NOTE — Consult Note (Signed)
Consultation Note Date: 03/29/2019   Patient Name: Elizabeth Thompson  DOB: [birth year]  MRN: [id number]  Age / Sex: 68 y.o., female  PCP: Everardo Beals, NP Referring Physician: Florencia Reasons, MD  Reason for Consultation: Establishing goals of care  HPI/Patient Profile: 68 y.o. female  with past medical history of pulmonary HTN, cor pulmonale, diastolic heart failure, CKD stage IV, diabetes, chronic back pain, recent admission 3/13-3/26/20 with acute on chronic respiratory decline requiring chronic high oxygen needs admitted on 03/27/2019 with worsening SOB and hypoxia now BiPAP dependent.   Clinical Assessment and Goals of Care: Elizabeth Thompson (and Elizabeth Thompson) are well known to me from past admission. When I visited today with Elizabeth Thompson she was very alert and communicative - in fact appeared improved from last admission. She continues on 90% FiO2 via BiPAP but has no complaints about BiPAP. She tells me that she feels good except she wants to eat. I explained that this may be difficult to do with the high amount of oxygen that she is currently requiring. Elizabeth Thompson's main concern is to be able to eat.   I called and spoke with Elizabeth Thompson [facility]) with Chaney's permission. Elizabeth Thompson tells me that they were doing very well at home until they could not keep her sats up. She was on 15L at home. They were desiring more aggressive care and opted to revoke hospice to obtain more aggressive care. They are both aware that Elizabeth Thompson has a terminal illness but Elizabeth Thompson struggles with transition to comfort care to allow a natural death when Elizabeth Thompson is so alert and content. Elizabeth Thompson understands that BiPAP is temporary and that after a couple days if Elizabeth Thompson is unable to eat then she will continue to naturally decline. Elizabeth Thompson does NOT want intubation or transfer to ICU for more aggressive measures. She does desire continued BiPAP and Lasix for  now to give Elizabeth Thompson a chance to improve. With further decline in mental state or oxygenation Elizabeth Thompson would be open to transition to comfort measures. All questions/concerns addressed. Emotional support provided.   These are goals are within the context of recent prolonged admission in which Elizabeth Thompson was doing very poorly but then had slow improvement and able to return home and with acceptable QOL. They just need some time to see if this is not going to be the case this time. I will follow up tomorrow.   Discussed with Dr. Erlinda Hong.   Primary Decision Maker HCPOA Elizabeth Thompson    SUMMARY OF RECOMMENDATIONS   - Will continue to follow and discuss - They are requesting some more time and are hopeful to see some improvements - No escalation to intubation or ICU level care  Code Status/Advance Care Planning:  DNR   Symptom Management:   No current symptoms. Breathing is regular and non-labored. If breathing becomes labored I would recommend low dose opioid (likely fentanyl or diaudid; avoid morphine with renal dysfunction).   Palliative Prophylaxis:   Aspiration, Bowel Regimen, Delirium Protocol, Frequent Pain Assessment and Oral Care  Psycho-social/Spiritual:   Desire for  further Chaplaincy support:no  Additional Recommendations: Caregiving  Support/Resources and Grief/Bereavement Support  Prognosis:   Will be better determined over the next few days. Overall prognosis poor with severe pulmonary hypertension and respiratory failure.   Discharge Planning: To Be Determined      Primary Diagnoses: Present on Admission:  Acute on chronic respiratory failure ([place])   I have reviewed the medical record, interviewed the patient and family, and examined the patient. The following aspects are pertinent.  Past Medical History:  Diagnosis Date   Acute respiratory failure ([place]) 06/09/2017   Arthritis    Back pain    Bell's palsy    Bipolar disorder ([facility])    Bronchitis    Chronic  low back pain 05/09/2015   Cough with expectoration 05/21/16   recent diagnosis of bronchitis   Cyst of right kidney    Diabetes mellitus without complication ([facility]CC)    type 2   Frequency of urination    GERD (gastroesophageal reflux disease)    Glaucoma    History of blood transfusion    History of colon polyps    History of hiatal hernia    History of tobacco use    Hypercalcemia    Hyperlipidemia    Hypertension    Memory disorder 09/05/2014   On home oxygen therapy    3L/M [place] at night    Schizo-affective psychosis ([place])    Shortness of breath dyspnea    exertion, or with out oxygen   Uterine fibroid    Social History   Socioeconomic History   Marital status: Divorced    Spouse name: Not on file   Number of children: 1   Years of education: college 4   Highest education level: Not on file  Occupational History   Occupation: disabled  Scientist, product/process development strain: Not on file   Food insecurity:    Worry: Not on file    Inability: Not on file   Transportation needs:    Medical: Not on file    Non-medical: Not on file  Tobacco Use   Smoking status: Former Smoker    Packs/day: 1.00    Years: 28.00    Pack years: 28.00    Types: Cigarettes    Last attempt to quit: 07/15/2015    Years since quitting: 3.7   Smokeless tobacco: Never Used  Substance and Sexual Activity   Alcohol use: No    Alcohol/week: 0.0 standard drinks   Drug use: No   Sexual activity: Not Currently    Birth control/protection: None  Lifestyle   Physical activity:    Days per week: Not on file    Minutes per session: Not on file   Stress: Not on file  Relationships   Social connections:    Talks on phone: Not on file    Gets together: Not on file    Attends religious service: Not on file    Active member of club or organization: Not on file    Attends meetings of clubs or organizations: Not on file    Relationship status: Not on file  Other  Topics Concern   Not on file  Social History Narrative   Patient is right handed.   Patient drinks 2 cups caffeine daily.   Family History  Problem Relation Age of Onset   Dementia Mother    Alzheimer's disease Mother    Heart disease Mother    Hypertension Mother    Diabetes Mother  Diabetes Father    Alzheimer's disease Sister    Heart disease Brother    Heart attack Brother    Bladder Cancer Neg Hx    Kidney cancer Neg Hx    Prostate cancer Neg Hx    Scheduled Meds:  ARIPiprazole  20 mg Oral QHS   aspirin EC  81 mg Oral Daily   atorvastatin  20 mg Oral QPC supper   brimonidine  1 drop Both Eyes BID   budesonide (PULMICORT) nebulizer solution  0.25 mg Nebulization BID   Chlorhexidine Gluconate Cloth  6 each Topical Q0600   donepezil  5 mg Oral QHS   dorzolamide-timolol  1 drop Both Eyes BID   ferrous sulfate  325 mg Oral QPC supper   heparin  5,000 Units Subcutaneous Q8H   insulin aspart  0-5 Units Subcutaneous QHS   insulin aspart  0-9 Units Subcutaneous TID WC   ipratropium-albuterol  3 mL Nebulization Q6H   lamoTRIgine  100 mg Oral QPC supper   latanoprost  1 drop Both Eyes QHS   methylPREDNISolone (SOLU-MEDROL) injection  40 mg Intravenous Q6H   mupirocin ointment   Nasal BID   sildenafil  20 mg Oral TID   Continuous Infusions:  ceFEPime (MAXIPIME) IV 1 g (03/29/19 1356)   furosemide 120 mg (03/29/19 0912)   vancomycin Stopped (03/29/19 0121)   PRN Meds:.albuterol, bisacodyl, polyethylene glycol, senna Allergies  Allergen Reactions   Codeine Nausea And Vomiting   Nitrostat [Nitroglycerin] Other (See Comments)    Pt is on revatio   Prednisone Other (See Comments)    Has to monitor because she is a diabetic    Review of Systems  Constitutional: Positive for activity change and fatigue. Negative for appetite change.  Respiratory:       Improved SOB on BiPAP  Neurological: Positive for weakness.    Physical  Exam Vitals signs and nursing note reviewed.  Constitutional:      Appearance: She is morbidly obese. She is ill-appearing.  Cardiovascular:     Rate and Rhythm: Normal rate.  Pulmonary:     Effort: Pulmonary effort is normal. No tachypnea, accessory muscle usage or respiratory distress.  Neurological:     Mental Status: She is alert and oriented to person, place, and time.     Vital Signs: BP (!) 127/95 (BP Location: Right Arm)    Pulse 96    Temp 98.2 F (36.8 C) (Oral)    Resp 18    Ht 5' 3.25" (1.607 m)    Wt 88.2 kg    SpO2 96%    BMI 34.17 kg/m  Pain Scale: 0-10   Pain Score: 0-No pain   SpO2: SpO2: 96 % O2 Device:SpO2: 96 % O2 Flow Rate: .O2 Flow Rate (L/min): 0 L/min  IO: Intake/output summary:   Intake/Output Summary (Last 24 hours) at 03/29/2019 1441 Last data filed at 03/29/2019 0300 Gross per 24 hour  Intake 633.07 ml  Output --  Net 633.07 ml    LBM: Last BM Date: 03/28/19 Baseline Weight: Weight: 86.6 kg Most recent weight: Weight: 88.2 kg     Palliative Assessment/Data:     Time In: 1430 Time Out: 1500 Time Total: 90 min Greater than 50%  of this time was spent counseling and coordinating care related to the above assessment and plan.  Signed by: Vinie Sill, NP Palliative Medicine Team Pager # [phone] (M-F 8a-5p) Team Phone # [phone] (Nights/Weekends)

## 2019-03-30 ENCOUNTER — Encounter (HOSPITAL_COMMUNITY): Payer: Self-pay | Admitting: General Practice

## 2019-03-30 DIAGNOSIS — R627 Adult failure to thrive: Secondary | ICD-10-CM

## 2019-03-30 DIAGNOSIS — R0902 Hypoxemia: Secondary | ICD-10-CM

## 2019-03-30 DIAGNOSIS — N184 Chronic kidney disease, stage 4 (severe): Secondary | ICD-10-CM

## 2019-03-30 DIAGNOSIS — I2781 Cor pulmonale (chronic): Secondary | ICD-10-CM

## 2019-03-30 LAB — CBC WITH DIFFERENTIAL/PLATELET
Abs Immature Granulocytes: 0.01 10*3/uL (ref 0.00–0.07)
Basophils Absolute: 0 10*3/uL (ref 0.0–0.1)
Basophils Relative: 0 %
Eosinophils Absolute: 0 10*3/uL (ref 0.0–0.5)
Eosinophils Relative: 0 %
HCT: 37 % (ref 36.0–46.0)
Hemoglobin: 11.4 g/dL — ABNORMAL LOW (ref 12.0–15.0)
Immature Granulocytes: 0 %
Lymphocytes Relative: 8 %
Lymphs Abs: 0.4 10*3/uL — ABNORMAL LOW (ref 0.7–4.0)
MCH: 27.5 pg (ref 26.0–34.0)
MCHC: 30.8 g/dL (ref 30.0–36.0)
MCV: 89.2 fL (ref 80.0–100.0)
Monocytes Absolute: 0.2 10*3/uL (ref 0.1–1.0)
Monocytes Relative: 4 %
Neutro Abs: 3.7 10*3/uL (ref 1.7–7.7)
Neutrophils Relative %: 88 %
Platelets: 481 10*3/uL — ABNORMAL HIGH (ref 150–400)
RBC: 4.15 MIL/uL (ref 3.87–5.11)
RDW: 19.2 % — ABNORMAL HIGH (ref 11.5–15.5)
WBC: 4.3 10*3/uL (ref 4.0–10.5)
nRBC: 0 % (ref 0.0–0.2)

## 2019-03-30 LAB — GLUCOSE, CAPILLARY
Glucose-Capillary: 143 mg/dL — ABNORMAL HIGH (ref 70–99)
Glucose-Capillary: 139 mg/dL — ABNORMAL HIGH (ref 70–99)
Glucose-Capillary: 252 mg/dL — ABNORMAL HIGH (ref 70–99)
Glucose-Capillary: 189 mg/dL — ABNORMAL HIGH (ref 70–99)

## 2019-03-30 LAB — BASIC METABOLIC PANEL
Anion gap: 16 — ABNORMAL HIGH (ref 5–15)
BUN: 54 mg/dL — ABNORMAL HIGH (ref 8–23)
CO2: 23 mmol/L (ref 22–32)
Calcium: 9.4 mg/dL (ref 8.9–10.3)
Chloride: 101 mmol/L (ref 98–111)
Creatinine, Ser: 2.3 mg/dL — ABNORMAL HIGH (ref 0.44–1.00)
GFR calc Af Amer: 24 mL/min — ABNORMAL LOW (ref >60)
GFR calc non Af Amer: 21 mL/min — ABNORMAL LOW (ref >60)
Glucose, Bld: 135 mg/dL — ABNORMAL HIGH (ref 70–99)
Potassium: 3.9 mmol/L (ref 3.5–5.1)
Sodium: 140 mmol/L (ref 135–145)

## 2019-03-30 LAB — MAGNESIUM: Magnesium: 2.3 mg/dL (ref 1.7–2.4)

## 2019-03-30 LAB — CULTURE, RESPIRATORY W GRAM STAIN

## 2019-03-30 LAB — CULTURE, RESPIRATORY: Culture: NORMAL

## 2019-03-30 MED ORDER — METHYLPREDNISOLONE SODIUM SUCC 40 MG IJ SOLR
40.0000 mg | Freq: Two times a day (BID) | INTRAMUSCULAR | Status: DC
  Administered 2019-03-30 – 2019-03-31 (×2): 40 mg via INTRAVENOUS
  Filled 2019-03-30 (×2): qty 1

## 2019-03-30 MED ORDER — DOXYCYCLINE HYCLATE 100 MG PO TABS
100.0000 mg | ORAL_TABLET | Freq: Two times a day (BID) | ORAL | Status: AC
  Administered 2019-03-30 – 2019-03-31 (×3): 100 mg via ORAL
  Filled 2019-03-30 (×3): qty 1

## 2019-03-30 NOTE — Consult Note (Signed)
CARDIOLOGY CONSULT NOTE  Patient ID: GABREILLE DARDIS MRN: [id number] DOB/AGE: 69-Apr-1952 69 y.o.  Admit date: 03/27/2019 Referring Physician  Randa Spike, MD Primary Physician:  Everardo Beals, NP Reason for Consultation  CHF  HPI: EYLA TALLON  is a 68 y.o. female  With WHO group 3 PAH. She has h/o left superior pole renal cell carcinoma cryoablation on July 2017 with second ablation on a new lesion on 10/01/2016. Prior tobacco use disorder (40 pack year quit 2017) with severe COPD, uses home oxygen, diabetes mellitus, hyperlipidemia, hypertension, GERD, PH treated with Opsumit and Sildenafil.   Patient was recently discharged home with hospice care, however hospice was discharged as patient was doing well as per her niece [name].  Patient was doing well until 2 nights ago she noticed that her saturations were in low 30-40, she made her sit up and the sats improved.  However as her sats were so low she decided to bring her to the emergency room where she was found to be in no acute distress but was treated aggressively with BiPAP with improving her saturations.  Eventually transferred to the floor, remained on BiPAP for 2 days trying to maintain saturations.  I was called to discuss options of management of hypoxemia and chronic cor pulmonale.  Patient completely comfortable in no acute distress today and denies any new symptoms except for chronic dyspnea.  Denies leg edema.  No chest pain or palpitations.   Past Medical History:  Diagnosis Date  . Acute respiratory distress 03/28/2019  . Acute respiratory failure ([place]) 06/09/2017  . Arthritis   . Back pain   . Bell's palsy   . Bipolar disorder ([place])   . Bronchitis   . Chronic low back pain 05/09/2015  . Cough with expectoration 05/21/16   recent diagnosis of bronchitis  . Cyst of right kidney   . Diabetes mellitus without complication ([place])    type 2  . Frequency of urination   . GERD (gastroesophageal reflux disease)   .  Glaucoma   . History of blood transfusion   . History of colon polyps   . History of hiatal hernia   . History of tobacco use   . Hypercalcemia   . Hyperlipidemia   . Hypertension   . Memory disorder 09/05/2014  . On home oxygen therapy    3L/M [place] at night   . Schizo-affective psychosis ([place])   . Shortness of breath dyspnea    exertion, or with out oxygen  . Uterine fibroid      Past Surgical History:  Procedure Laterality Date  . ABDOMINAL HYSTERECTOMY    . APPENDECTOMY    . COLONOSCOPY W/ POLYPECTOMY    . IR GENERIC HISTORICAL  04/29/2016   IR RADIOLOGIST EVAL & MGMT 04/29/2016 Corrie Mckusick, DO [facility]  . IR GENERIC HISTORICAL  10/01/2016   IR RADIOLOGIST EVAL & MGMT 10/01/2016 [facility]  . IR GENERIC HISTORICAL  11/11/2016   IR RADIOLOGIST EVAL & MGMT 11/11/2016 Corrie Mckusick, DO [facility]  . IR GENERIC HISTORICAL  07/16/2016   IR RADIOLOGIST EVAL & MGMT 07/16/2016 Corrie Mckusick, DO [facility]  . IR RADIOLOGIST EVAL & MGMT  03/10/2017  . IR RADIOLOGIST EVAL & MGMT  05/13/2017  . IR RADIOLOGIST EVAL & MGMT  04/27/2018  . LUMBAR LAMINECTOMY/DECOMPRESSION MICRODISCECTOMY N/A 08/14/2015   Procedure: LUMBAR DECOMPRESSION MICRODISCECTOMY L3-S1;  Surgeon: Melina Schools, MD;  Location: [facility];  Service: Orthopedics;  Laterality: N/A;  .  RADIOFREQUENCY ABLATION Left 02/05/2017   Procedure: LEFT RENAL CRYOABLATION;  Surgeon: Corrie Mckusick, DO;  Location: [facility];  Service: Anesthesiology;  Laterality: Left;  . RIGHT HEART CATH N/A 07/20/2017   Procedure: Right Heart Cath;  Surgeon: Nigel Mormon, MD;  Location: [facility];  Service: Cardiovascular;  Laterality: N/A;  . RIGHT/LEFT HEART CATH AND CORONARY ANGIOGRAPHY N/A 06/11/2017   Procedure: Right/Left Heart Cath and Coronary Angiography;  Surgeon: Dixie Dials, MD;  Location: [facility];  Service: Cardiovascular;  Laterality: N/A;  . TONSILLECTOMY AND ADENOIDECTOMY       Family History   Problem Relation Age of Onset  . Dementia Mother   . Alzheimer's disease Mother   . Heart disease Mother   . Hypertension Mother   . Diabetes Mother   . Diabetes Father   . Alzheimer's disease Sister   . Heart disease Brother   . Heart attack Brother   . Bladder Cancer Neg Hx   . Kidney cancer Neg Hx   . Prostate cancer Neg Hx      Social History: Social History   Socioeconomic History  . Marital status: Divorced    Spouse name: Not on file  . Number of children: 1  . Years of education: college 4  . Highest education level: Not on file  Occupational History  . Occupation: disabled  Social Needs  . Financial resource strain: Not on file  . Food insecurity:    Worry: Not on file    Inability: Not on file  . Transportation needs:    Medical: Not on file    Non-medical: Not on file  Tobacco Use  . Smoking status: Former Smoker    Packs/day: 1.00    Years: 28.00    Pack years: 28.00    Types: Cigarettes    Last attempt to quit: 07/15/2015    Years since quitting: 3.7  . Smokeless tobacco: Never Used  Substance and Sexual Activity  . Alcohol use: No    Alcohol/week: 0.0 standard drinks  . Drug use: No  . Sexual activity: Not Currently    Birth control/protection: None  Lifestyle  . Physical activity:    Days per week: Not on file    Minutes per session: Not on file  . Stress: Not on file  Relationships  . Social connections:    Talks on phone: Not on file    Gets together: Not on file    Attends religious service: Not on file    Active member of club or organization: Not on file    Attends meetings of clubs or organizations: Not on file    Relationship status: Not on file  . Intimate partner violence:    Fear of current or ex partner: Not on file    Emotionally abused: Not on file    Physically abused: Not on file    Forced sexual activity: Not on file  Other Topics Concern  . Not on file  Social History Narrative   Patient is right handed.   Patient  drinks 2 cups caffeine daily.     Medications Prior to Admission  Medication Sig Dispense Refill Last Dose  . acetaminophen (TYLENOL) 650 MG CR tablet Take 1,300 mg by mouth See admin instructions. Take 2 tablets (1300 mg) by mouth every morning, make take 2 tablets (1300 mg) at night as needed for pain   03/27/2019 at am  . albuterol (PROAIR HFA) 108 (90 Base) MCG/ACT inhaler Inhale  2 puffs into the lungs every 6 (six) hours as needed for wheezing or shortness of breath.   03/27/2019 at am  . albuterol (PROVENTIL) (2.5 MG/3ML) 0.083% nebulizer solution Take 2.5 mg by nebulization every 4 (four) hours as needed for wheezing or shortness of breath.    03/27/2019 at am  . amLODipine (NORVASC) 10 MG tablet Take 1 tablet (10 mg total) by mouth daily. 30 tablet 0 03/27/2019 at am  . ARIPiprazole (ABILIFY) 20 MG tablet Take 1 tablet (20 mg total) by mouth at bedtime. 30 tablet 0 03/26/2019 at pm  . ASPERCREME LIDOCAINE EX Apply 1 application topically 2 (two) times daily as needed (back pain).   03/27/2019 at am  . aspirin EC 81 MG EC tablet Take 1 tablet (81 mg total) by mouth daily. 30 tablet 0 03/27/2019 at am  . atorvastatin (LIPITOR) 20 MG tablet Take 20 mg by mouth daily after supper.   03/26/2019 at pm  . bisacodyl (DULCOLAX) 10 MG suppository Place 1 suppository (10 mg total) rectally daily as needed for moderate constipation. 12 suppository 0 unknown  . brimonidine (ALPHAGAN) 0.2 % ophthalmic solution Place 1 drop into both eyes 2 (two) times daily.   03/27/2019 at am  . budesonide-formoterol (SYMBICORT) 160-4.5 MCG/ACT inhaler Inhale 2 puffs into the lungs 2 (two) times daily. 1 Inhaler 0 03/27/2019 at am  . donepezil (ARICEPT) 5 MG tablet Take 1 tablet (5 mg total) by mouth at bedtime. 30 tablet 0 03/26/2019 at pm  . dorzolamide-timolol (COSOPT) 22.3-6.8 MG/ML ophthalmic solution Place 1 drop into both eyes 2 (two) times daily.   03/27/2019 at 900  . ferrous sulfate 325 (65 FE) MG tablet Take 325 mg by  mouth 2 (two) times daily with a meal.    03/27/2019 at am  . furosemide (LASIX) 80 MG tablet Take 1 tablet (80 mg total) by mouth daily. 30 tablet 0 03/27/2019 at am  . glimepiride (AMARYL) 4 MG tablet Take 0.5 tablets (2 mg total) by mouth daily with breakfast. 30 tablet 0 03/26/2019 at am  . Ipratropium-Albuterol (COMBIVENT RESPIMAT) 20-100 MCG/ACT AERS respimat Inhale 1-2 puffs into the lungs every 4 (four) hours as needed for wheezing.    03/27/2019 at am  . lamoTRIgine (LAMICTAL) 100 MG tablet Take 1 tablet (100 mg total) by mouth every evening. (Patient taking differently: Take 100 mg by mouth daily after supper. ) 30 tablet 0 03/26/2019 at pm  . latanoprost (XALATAN) 0.005 % ophthalmic solution Place 1 drop into both eyes at bedtime. 2.5 mL 12 03/26/2019 at pm  . Magnesium 250 MG TABS Take 250 mg by mouth See admin instructions. Take 1 tablet (250 mg) by mouth every other day after supper   03/26/2019 at pm  . Menthol, Topical Analgesic, (BIOFREEZE EX) Apply 1 application topically 2 (two) times daily as needed (back pain).   03/26/2019 at Unknown time  . metFORMIN (GLUCOPHAGE) 1000 MG tablet Take 1 tablet (1,000 mg total) by mouth daily with breakfast.   03/26/2019 at am  . Phenylephrine-APAP-Guaifenesin (MUCINEX SINUS-MAX) 10-650-400 MG/20ML LIQD Take 20 mLs by mouth 2 (two) times daily.   03/27/2019 at am  . polyethylene glycol (MIRALAX / GLYCOLAX) packet Take 17 g by mouth daily. (Patient taking differently: Take 17 g by mouth every other day. Mix in 4-8 oz liquid and drink) 14 each 0 2 days ago  . Selenium Sulf-Pyrithione-Urea 2.25 % SHAM Apply 1 application topically See admin instructions. Apply topically to scalp for dermatitis once  every 14 days  3 month ago  . senna (SENOKOT) 8.6 MG tablet Take 1 tablet by mouth daily as needed for constipation.    unknown  . sildenafil (REVATIO) 20 MG tablet Take 1 tablet (20 mg total) by mouth 3 (three) times daily. 90 tablet 0 03/27/2019 at am  . sodium  bicarbonate 650 MG tablet Take 1,300 mg by mouth 2 (two) times daily.   03/27/2019 at am  . feeding supplement (BOOST / RESOURCE BREEZE) LIQD Take 1 Container by mouth 3 (three) times daily between meals. (Patient not taking: Reported on 02/24/2019) 60 Container 0 Not Taking at Unknown time  . glucose blood (ACCU-CHEK AVIVA) test strip 1 each by Other route 2 (two) times daily. 200 each 5 Taking  . Respiratory Therapy Supplies (FLUTTER) DEVI Use as directed. 1 each 0 Taking    Review of Systems  Constitutional: Positive for malaise/fatigue (chronic). Negative for weight loss.  Respiratory: Positive for cough and shortness of breath. Negative for hemoptysis, sputum production and wheezing.   Cardiovascular: Negative for chest pain, palpitations, claudication and leg swelling.  Gastrointestinal: Negative for abdominal pain, blood in stool, constipation, heartburn and vomiting.  Genitourinary: Negative for dysuria.  Musculoskeletal: Positive for joint pain. Negative for myalgias.  Neurological: Negative for dizziness, focal weakness and headaches.  Endo/Heme/Allergies: Does not bruise/bleed easily.  Psychiatric/Behavioral: Positive for depression. The patient is nervous/anxious.   All other systems reviewed and are negative.   Physical Exam: Blood pressure 131/82, pulse 93, temperature 97.7 F (36.5 C), temperature source Axillary, resp. rate 14, height 5' 3.25" (1.607 m), weight 88.2 kg, SpO2 91 %.  Physical Exam  Constitutional: She is oriented to person, place, and time. She appears lethargic. She is easily aroused. No distress.  Moderately obese, appears older than stated age  HENT:  Head: Atraumatic.  Eyes: Conjunctivae are normal.  Neck: Neck supple. No JVD present. No thyromegaly present.  Short neck and difficult to evaluate JVP  Cardiovascular: Normal rate, regular rhythm, normal heart sounds and intact distal pulses. Exam reveals no gallop.  No murmur heard. Pulses:       Carotid pulses are 2+ on the right side and 2+ on the left side.      Dorsalis pedis pulses are 2+ on the right side and 2+ on the left side.       Posterior tibial pulses are 2+ on the right side and 2+ on the left side.  Femoral and popliteal pulse difficult to feel due to patient's body habitus.   Pulmonary/Chest: No respiratory distress. She has wheezes (mild scattered wheeze). She has no rales.  Barrel shaped  Abdominal: Soft. Bowel sounds are normal. There is no abdominal tenderness. There is no rebound.  Obese. Pannus present.   Musculoskeletal: Normal range of motion.        General: No edema.  Neurological: She is oriented to person, place, and time and easily aroused. She has normal reflexes. She appears lethargic.  Skin: Skin is warm and dry.  Psychiatric: She has a normal mood and affect.    Labs:  BNP (last 3 results) Recent Labs    02/24/19 1150 02/26/19 0632 03/27/19 1531  BNP 371.7* 547.8* 593.2*    CMP Latest Ref Rng & Units 03/30/2019 03/29/2019 03/28/2019  Glucose 70 - 99 mg/dL 135(H) 139(H) 158(H)  BUN 8 - 23 mg/dL 54(H) 46(H) 30(H)  Creatinine 0.44 - 1.00 mg/dL 2.30(H) 2.48(H) 2.31(H)  Sodium 135 - 145 mmol/L 140 139 137  Potassium  3.5 - 5.1 mmol/L 3.9 4.2 4.2  Chloride 98 - 111 mmol/L 101 102 100  CO2 22 - 32 mmol/L 23 20(L) 22  Calcium 8.9 - 10.3 mg/dL 9.4 9.1 9.2  Total Protein 6.5 - 8.1 g/dL - 6.8 -  Total Bilirubin 0.3 - 1.2 mg/dL - 0.9 -  Alkaline Phos 38 - 126 U/L - 57 -  AST 15 - 41 U/L - 22 -  ALT 0 - 44 U/L - 18 -     CBC Latest Ref Rng & Units 03/30/2019 03/29/2019 03/28/2019  WBC 4.0 - 10.5 K/uL 4.3 4.3 5.2  Hemoglobin 12.0 - 15.0 g/dL 11.4(L) 10.9(L) 11.3(L)  Hematocrit 36.0 - 46.0 % 37.0 35.9(L) 39.4  Platelets 150 - 400 K/uL 481(H) 441(H) 419(H)     Lipid Panel     Component Value Date/Time   CHOL 166 06/13/2017 0728   TRIG 72 06/13/2017 0728   HDL 61 06/13/2017 0728   CHOLHDL 2.7 06/13/2017 0728   VLDL 14 06/13/2017 0728    LDLCALC 91 06/13/2017 0728   LDLDIRECT 142.0 03/19/2016 1129     BNP (last 3 results) Recent Labs    02/24/19 1150 02/26/19 0632 03/27/19 1531  BNP 371.7* 547.8* 593.2*     HEMOGLOBIN A1C Lab Results  Component Value Date   HGBA1C 6.5 (H) 02/24/2019   MPG 139.85 02/24/2019    BMP Latest Ref Rng & Units 03/30/2019 03/29/2019 03/28/2019  Glucose 70 - 99 mg/dL 135(H) 139(H) 158(H)  BUN 8 - 23 mg/dL 54(H) 46(H) 30(H)  Creatinine 0.44 - 1.00 mg/dL 2.30(H) 2.48(H) 2.31(H)  BUN/Creat Ratio 11 - 26 - - -  Sodium 135 - 145 mmol/L 140 139 137  Potassium 3.5 - 5.1 mmol/L 3.9 4.2 4.2  Chloride 98 - 111 mmol/L 101 102 100  CO2 22 - 32 mmol/L 23 20(L) 22  Calcium 8.9 - 10.3 mg/dL 9.4 9.1 9.2     TSH Recent Labs    02/26/19 1453  TSH 0.308*     Radiology: Dg Chest Port 1 View  Result Date: 03/29/2019 CLINICAL DATA:  Shortness of breath. EXAM: PORTABLE CHEST 1 VIEW COMPARISON:  03/27/2019 and back to 01/03/2019 FINDINGS: Patient rotated minimally left. Midline trachea. Cardiomegaly accentuated by AP portable technique. Probable small left pleural effusion. No pneumothorax. Lower lung predominant interstitial opacities are similar. More confluent left lower lobe airspace disease is also not significantly changed. IMPRESSION: No significant change since 03/27/2019. Lower lung predominant interstitial and airspace disease, atypical infection and/or mild pulmonary venous congestion. Electronically Signed   By: Abigail Miyamoto M.D.   On: 03/29/2019 08:49    Scheduled Meds: . ARIPiprazole  20 mg Oral QHS  . aspirin EC  81 mg Oral Daily  . atorvastatin  20 mg Oral QPC supper  . brimonidine  1 drop Both Eyes BID  . budesonide (PULMICORT) nebulizer solution  0.25 mg Nebulization BID  . Chlorhexidine Gluconate Cloth  6 each Topical Q0600  . donepezil  5 mg Oral QHS  . dorzolamide-timolol  1 drop Both Eyes BID  . doxycycline  100 mg Oral Q12H  . ferrous sulfate  325 mg Oral QPC supper  .  heparin  5,000 Units Subcutaneous Q8H  . insulin aspart  0-5 Units Subcutaneous QHS  . insulin aspart  0-9 Units Subcutaneous TID WC  . ipratropium-albuterol  3 mL Nebulization Q6H  . lamoTRIgine  100 mg Oral QPC supper  . latanoprost  1 drop Both Eyes QHS  . methylPREDNISolone (SOLU-MEDROL)  injection  40 mg Intravenous Q12H  . mupirocin ointment   Nasal BID  . sildenafil  20 mg Oral TID   Continuous Infusions: . furosemide 62 mL/hr at 03/30/19 1800   PRN Meds:.albuterol, bisacodyl, polyethylene glycol, senna  CARDIAC STUDIES:  EKG 03/27/2019: Sinus tachycardia at rate of 114 bpm, right axis deviation, incomplete right bundle branch block.  No evidence of ischemia.  No change from 02/24/2019.  Echocardiogram 01/07/2019: - Left ventricle: The cavity size was normal. Wall thickness was  increased in a pattern of mild LVH. Systolic function was normal.  The estimated ejection fraction was in the range of 55% to 60%.  Wall motion was normal; there were no regional wall motion  abnormalities. Doppler parameters are consistent with abnormal  left ventricular relaxation (grade 1 diastolic dysfunction).  Doppler parameters are consistent with high ventricular filling  pressure. - Right ventricle: Systolic function was moderately reduced.  ASSESSMENT AND PLAN:  1.  Chronic cor pulmonale secondary to underlying severe COPD exacerbated by severe anemia, progression of underlying COPD and probably pulmonary hypertension. 2. Chronic diastolic heart failure, LV 3.  Anemia of chronic disease and also related to stage IV chronic kidney disease.    Recommendation:  I was able to discuss with the patient regarding her hypoxemia, patient states that she was woken up from her sleep by her niece because her saturations were low.  Has no distress, otherwise asymptomatic.  Due to COVID-19, patient's niece not allowed to be in the room, I did reach her out at home and had a long discussion with her that  patient should not be brought to the emergency room just because of hypoxemia as patient even with saturation of 60 is sitting comfortably without distress and she is probably very functional in spite of severe hypoxemia due to chronic underlying COPD.  Goals of end-of-life discussed with the patient's niece who is the caregiver, although the hospice has been discharged as the niece felt that they needed to do nothing as patient was feeling well, I agree that her knees being very vigilant we can continue DNR orders and she can be discharged home tomorrow and patient's niece [name] is aware not to bring her to the emergency room unless she is in distress.  We also discussed that in case she were to get in acute distress and is confused and doing poorly, that aggressive measures including BiPAP and CPAP should not be performed.  She is in agreement with this. I would not change any therapy that she was presently on at home can be discharged home on home medications tomorrow morning.  I have also placed an alert sticky note on her chart that states below:  Please do not place BiPAP or CPAP if she comes to the ED without distress, has chronic COPD and stable even with FiO2 60%. If patient is confused and in acute distress, please do not perform  CPR, consider comfort care only.  You can contact us if you need assistance. Adrian Prows, [facility]   Thanks for the consultation.  Adrian Prows, MD, [facility] 03/30/2019, 6:23 PM [facility]. [place] Pager: [phone] Office: [phone] If no answer Cell [phone]

## 2019-03-30 NOTE — Progress Notes (Signed)
Pt not in need of BIPAP V60 at this time. Pt order is PRN at this time. Pt sat goal >70%. Pt currently on HFNC [place] on 15LPM with sats of 94%. RT will continue to monitor.

## 2019-03-30 NOTE — Progress Notes (Signed)
Palliative:  I met again today with Elizabeth Thompson. Kerrilyn is frustrated that she cannot eat/drink. She does not understand her disease process or trajectory. She is constantly requesting fluids. She is also fixated that she needs blood but I continue to try and explain that she does not need any blood products or iron as this is not going to help her situation. She is also telling me she wants to go home.   I called and spoke with London Pepper. We discussed currently that Chanay is diuresing well and becoming very frustrated with not coming off BiPAP to eat/drink. Olin Hauser is still hopeful that Maily will eventually show some improvement in her tolerance of off BiPAP and oxygenation. She also understands that this may not happen. She requests input from Dr. Einar Gip (acknowledging that there is likely little more that can be done with end stage pulmonary hypertension and cor pulmonale). She wants to ensure that we have tried within reason to help Sadiya to improve enough to return home. She is not ready to d/c BiPAP yet.   We discussed a plan to try and balance continuing current therapies to give Rubie time to see if she can improve as well as trying to allow her some time off BiPAP and to eat/drink so she isn't so miserable. Olin Hauser says that Deonne often sats in 70s at home during meals so agrees this would be adequate to try and give her some more time off BiPAP. Sat goals > 70% to allow intake and >80% to stay off BiPAP. Agree to reassess tomorrow for options if no improvement noted.   All questions/concerns addressed.   Plan: - Continue BiPAP for now as indicated.  - New saturation goals  > 70% to allow intake and >80% to stay off BiPAP.  - Readdress options tomorrow if no improvement.  - Struggling with transition to comfort care given her alertness and orientation.   59 min  Vinie Sill, NP Palliative Medicine Team Pager # [phone] (M-F 8a-5p) Team Phone # [phone]  (Nights/Weekends)

## 2019-03-30 NOTE — Progress Notes (Signed)
PROGRESS NOTE  Elizabeth Thompson:[id number] DOB: 06/04/1951 DOA: 03/27/2019 PCP: Everardo Beals, NP  Brief Narrative:  Patient is a 68 year old African-American female with past medical history significant for COPD, severe pulmonary hypertension, chronic respiratory failure on 6 L of oxygen. Patient has been admitted repeatedly due to acute on chronic respiratory failure. Other past medical history includes hypertension, hyperlipidemia, tobacco use, GERD, diabetes mellitus without complications and bipolar disorder amongstother medical problems. Patient was discharged from this hospital on March 09, 2019. The discharge plan included for the patient to be discharged back home with home hospice. Apparently, 1 of the patient's care went to see the patient earlier today and find out that the O2 sat was significantly decreased. The patient is requiring 15 L of supplemental oxygen for now. Blood gas done revealed pH of 7.39, PCO2 of 43.8 and PO2 of 22 (suspect that this is venous gas). Cardiac BNP is elevated at 593.2, however, patient serum creatinine is 1.99. Troponin is 0.03. Procalcitonin is 0.16. Chest x-ray and CT reveal borderline cardiomegaly, pulmonary vascular congestion and slightly increased non-specific bibasilar airspace opacities. Patient be admitted for further assessment and management. No headache, no neck pain, no URI symptoms, no chest pain, and no productive cough, no GI symptoms and no urinary symptoms.  ED Course:Patient is currently on supplemental oxygen. Patient has been given nebulizer treatment and IV Solu-Medrol. ER provider discussed case with the ICU team, and they were advised for the hospitalist team to admit patient.    HPI/Recap of past 24 hours:   Patient is alert, interactive, she wants to take off bipap, she wants to eat She denies pain, no fever, no cough, no edema Great urine output on lasix, bp/cr stable on current dose of lasix, urine is  clear  Blood glucose elevated while on steroids   Assessment/Plan: Active Problems:   Diabetes mellitus type 2, noninsulin dependent (HCC)   COPD with acute exacerbation (HCC)   Acute on chronic respiratory failure (HCC)   CKD (chronic kidney disease) stage 4, GFR 15-29 ml/min (HCC)  Acute on Chronic Hypoxic Respiratory Failure, Suspect this is related to heart failure and pulmonary hypertension.  She's being treated possible COPD exacerbation/pneumonia as well. -Pt on 6 L at baseline -She is started on bipap due to sats range from 60's to the 80's on a NRB on admission -she is started on high dose lasix 120 mg BID (she is on lasix 56m daily), good urine output, bp /cr tolerating , will continue for another 24hrs -no wheezing today, will decrease solumedrol,  Continue prn and scheduled nebs -she is started on on vanc/cefepime on admission for possible infection, she does not appear septic, no fever, no leukocytosis,  Lactic acid wnl, procalcitonin low at 0.16 -she isTested negative for novel coronavirus (no fevers, travel, or known sick contacts) on 4/13 - respiratory cultures Consistent with normal respiratory flora. , blood culture 1/2 coag negative staph, likely contamination, -will d/c vanc/cefepime , change to doxycycline (MRSA screening +) -Palliative care consulted, per   Hypokalemia k normalized, monitor k while on lasix  Severe pulmonary hypertension: Continue sildenofil OPSUMIT d/c'd by cards at last hospitalization, niece request cardiology opinion on restarting opsumit Diuresis as above Case discussed with Dr GEinar Gipover the phone, cardiology will formally consult  COPD: On nebs/steroids as noted above No wheezing today, will taper steroids  HFpEF: EF normal in 19/5621 mild diastolic dysfunction On lasix 80 mg daily, aggressive diuresis at this time as noted above  Hypertension:  Episode of hypotension was noted earlier Stable now Continue to hold amlodipine  Aggressive diuresis as noted above Continue to monitor for now.  noninsulin dependent dm2, controlled Home meds amaryl/ metfromin held , recent a1c in 02/2019 was 6.5 On ssi here  Chronic Kidney Disease Stage IV: baseline creatinine appears to be between 2-2.5.  Follow closely with aggressive diuresis as noted above.  Anemia of chronic disease -hgb 10.9   Psych: she is stable on abilify, lamictal , aricept continued. She is aaox3, she can tell me her niece's phone number   Obesity: Body mass index is 34.17 kg/m.   Code Status: DNR  Family Communication: patient and HPOA over the phone  Disposition Plan: not ready to discharge, poor prognosis   Consultants:  Palliative care  cardiology  Procedures:  bipap  Antibiotics:  Vanc/cefepime from admission to 4/16  Doxycycline from 4/16 to    Objective: BP 127/74 (BP Location: Right Arm)   Pulse 83   Temp (!) 97.3 F (36.3 C) (Oral)   Resp 17   Ht 5' 3.25" (1.607 m)   Wt 88.2 kg   SpO2 94%   BMI 34.17 kg/m   Intake/Output Summary (Last 24 hours) at 03/30/2019 0803 Last data filed at 03/30/2019 0500 Gross per 24 hour  Intake 235.37 ml  Output 1700 ml  Net -1464.63 ml   Filed Weights   03/27/19 1528 03/28/19 0028  Weight: 86.6 kg 88.2 kg    Exam: Patient is examined daily including today on 03/30/2019, exams remain the same as of yesterday except that has changed    General:  NAD, on bipap, aaox3  Cardiovascular: RRR  Respiratory: diminished, no wheezing, no rales, no rhonchi  Abdomen: Soft/ND/NT, positive BS  Musculoskeletal: No Edema  Neuro: alert, oriented   Data Reviewed: Basic Metabolic Panel: Recent Labs  Lab 03/27/19 1524 03/27/19 1531 03/27/19 2021 03/28/19 0453 03/29/19 0513 03/30/19 0406  NA 138 139  --  137 139 140  K 3.4* 3.3*  --  4.2 4.2 3.9  CL  --  100  --  100 102 101  CO2  --  23  --  22 20* 23  GLUCOSE  --  150*  --  158* 139* 135*  BUN  --  22  --  30* 46* 54*   CREATININE  --  1.99*  --  2.31* 2.48* 2.30*  CALCIUM  --  9.5  --  9.2 9.1 9.4  MG  --   --  2.1  --  2.3 2.3  PHOS  --   --  3.8  --   --   --    Liver Function Tests: Recent Labs  Lab 03/27/19 1531 03/29/19 0513  AST 20 22  ALT 12 18  ALKPHOS 64 57  BILITOT 0.6 0.9  PROT 7.0 6.8  ALBUMIN 3.9 3.7   No results for input(s): LIPASE, AMYLASE in the last 168 hours. No results for input(s): AMMONIA in the last 168 hours. CBC: Recent Labs  Lab 03/27/19 1524 03/27/19 1531 03/28/19 0453 03/29/19 0513 03/30/19 0406  WBC  --  7.7 5.2 4.3 4.3  NEUTROABS  --  6.2  --   --  3.7  HGB 12.6 11.9* 11.3* 10.9* 11.4*  HCT 37.0 40.2 39.4 35.9* 37.0  MCV  --  92.4 91.0 89.8 89.2  PLT  --  425* 419* 441* 481*   Cardiac Enzymes:   Recent Labs  Lab 03/27/19 1550  TROPONINI 0.03*   BNP (  last 3 results) Recent Labs    02/24/19 1150 02/26/19 0632 03/27/19 1531  BNP 371.7* 547.8* 593.2*    ProBNP (last 3 results) No results for input(s): PROBNP in the last 8760 hours.  CBG: Recent Labs  Lab 03/28/19 2132 03/29/19 0822 03/29/19 1200 03/29/19 1730 03/29/19 2119  GLUCAP 144* 155* 147* 137* 119*    Recent Results (from the past 240 hour(s))  MRSA PCR Screening     Status: Abnormal   Collection Time: 03/27/19  1:09 AM  Result Value Ref Range Status   MRSA by PCR POSITIVE (A) NEGATIVE Final    Comment: CRITICAL RESULT CALLED TO, READ BACK BY AND VERIFIED WITH: RN,A. Simone Curia [id number]  THANEY Performed at [facility], [place] [address], [place], [place] [address]   Blood Culture (routine x 2)     Status: Abnormal   Collection Time: 03/27/19  3:38 PM  Result Value Ref Range Status   Specimen Description BLOOD RIGHT FOREARM  Final   Special Requests   Final    BOTTLES DRAWN AEROBIC AND ANAEROBIC Blood Culture adequate volume   Culture  Setup Time   Final    GRAM POSITIVE COCCI IN CLUSTERS AEROBIC BOTTLE ONLY CRITICAL RESULT CALLED TO, READ BACK BY AND VERIFIED  WITH:  J [place] PHARMD 03/28/19 1751 JDW    Culture (A)  Final    STAPHYLOCOCCUS SPECIES (COAGULASE NEGATIVE) THE SIGNIFICANCE OF ISOLATING THIS ORGANISM FROM A SINGLE SET OF BLOOD CULTURES WHEN MULTIPLE SETS ARE DRAWN IS UNCERTAIN. PLEASE NOTIFY THE MICROBIOLOGY DEPARTMENT WITHIN ONE WEEK IF SPECIATION AND SENSITIVITIES ARE REQUIRED. Performed at [facility], [place] [address] [place], [place] [address]    Report Status 03/29/2019 FINAL  Final  Blood Culture ID Panel (Reflexed)     Status: Abnormal   Collection Time: 03/27/19  3:38 PM  Result Value Ref Range Status   Enterococcus species NOT DETECTED NOT DETECTED Final   Listeria monocytogenes NOT DETECTED NOT DETECTED Final   Staphylococcus species DETECTED (A) NOT DETECTED Final    Comment: Methicillin (oxacillin) resistant coagulase negative staphylococcus. Possible blood culture contaminant (unless isolated from more than one blood culture draw or clinical case suggests pathogenicity). No antibiotic treatment is indicated for blood  culture contaminants. CRITICAL RESULT CALLED TO, READ BACK BY AND VERIFIED WITH: J [place] PHARMD 03/28/19 1751 JDW    Staphylococcus aureus (BCID) NOT DETECTED NOT DETECTED Final   Methicillin resistance DETECTED (A) NOT DETECTED Final    Comment: CRITICAL RESULT CALLED TO, READ BACK BY AND VERIFIED WITH: J [place] PHARMD 03/28/19 1751 JDW    Streptococcus species NOT DETECTED NOT DETECTED Final   Streptococcus agalactiae NOT DETECTED NOT DETECTED Final   Streptococcus pneumoniae NOT DETECTED NOT DETECTED Final   Streptococcus pyogenes NOT DETECTED NOT DETECTED Final   Acinetobacter baumannii NOT DETECTED NOT DETECTED Final   Enterobacteriaceae species NOT DETECTED NOT DETECTED Final   Enterobacter cloacae complex NOT DETECTED NOT DETECTED Final   Escherichia coli NOT DETECTED NOT DETECTED Final   Klebsiella oxytoca NOT DETECTED NOT DETECTED Final   Klebsiella pneumoniae NOT DETECTED NOT DETECTED  Final   Proteus species NOT DETECTED NOT DETECTED Final   Serratia marcescens NOT DETECTED NOT DETECTED Final   Haemophilus influenzae NOT DETECTED NOT DETECTED Final   Neisseria meningitidis NOT DETECTED NOT DETECTED Final   Pseudomonas aeruginosa NOT DETECTED NOT DETECTED Final   Candida albicans NOT DETECTED NOT DETECTED Final   Candida glabrata NOT DETECTED NOT DETECTED Final  Candida krusei NOT DETECTED NOT DETECTED Final   Candida parapsilosis NOT DETECTED NOT DETECTED Final   Candida tropicalis NOT DETECTED NOT DETECTED Final    Comment: Performed at [facility], [place] [address], [place], [place] [address]  Blood Culture (routine x 2)     Status: None (Preliminary result)   Collection Time: 03/27/19  3:39 PM  Result Value Ref Range Status   Specimen Description BLOOD RIGHT ANTECUBITAL  Final   Special Requests   Final    BOTTLES DRAWN AEROBIC ONLY Blood Culture results may not be optimal due to an inadequate volume of blood received in culture bottles   Culture   Final    NO GROWTH 2 DAYS Performed at [facility], [place] [address], [place], [place] [address]    Report Status PENDING  Incomplete  SARS Coronavirus 2 [facility] order, Performed in [place] hospital lab)     Status: None   Collection Time: 03/27/19  3:46 PM  Result Value Ref Range Status   SARS Coronavirus 2 NEGATIVE NEGATIVE Final    Comment: (NOTE) If result is NEGATIVE SARS-CoV-2 target nucleic acids are NOT DETECTED. The SARS-CoV-2 RNA is generally detectable in upper and lower  respiratory specimens during the acute phase of infection. The lowest  concentration of SARS-CoV-2 viral copies this assay can detect is 250  copies / mL. A negative result does not preclude SARS-CoV-2 infection  and should not be used as the sole basis for treatment or other  patient management decisions.  A negative result may occur with  improper specimen collection / handling, submission of specimen other  than  nasopharyngeal swab, presence of viral mutation(s) within the  areas targeted by this assay, and inadequate number of viral copies  (<250 copies / mL). A negative result must be combined with clinical  observations, patient history, and epidemiological information. If result is POSITIVE SARS-CoV-2 target nucleic acids are DETECTED. The SARS-CoV-2 RNA is generally detectable in upper and lower  respiratory specimens dur ing the acute phase of infection.  Positive  results are indicative of active infection with SARS-CoV-2.  Clinical  correlation with patient history and other diagnostic information is  necessary to determine patient infection status.  Positive results do  not rule out bacterial infection or co-infection with other viruses. If result is PRESUMPTIVE POSTIVE SARS-CoV-2 nucleic acids MAY BE PRESENT.   A presumptive positive result was obtained on the submitted specimen  and confirmed on repeat testing.  While 2019 novel coronavirus  (SARS-CoV-2) nucleic acids may be present in the submitted sample  additional confirmatory testing may be necessary for epidemiological  and / or clinical management purposes  to differentiate between  SARS-CoV-2 and other Sarbecovirus currently known to infect humans.  If clinically indicated additional testing with an alternate test  methodology [phone]) is advised. The SARS-CoV-2 RNA is generally  detectable in upper and lower respiratory sp ecimens during the acute  phase of infection. The expected result is Negative. Fact Sheet for Patients:  StrictlyIdeas.no Fact Sheet for Healthcare Providers: BankingDealers.co.za This test is not yet approved or cleared by the [place] FDA and has been authorized for detection and/or diagnosis of SARS-CoV-2 by FDA under an Emergency Use Authorization (EUA).  This EUA will remain in effect (meaning this test can be used) for the duration of the  COVID-19 declaration under Section 564(b)(1) of the Act, 21 U.S.C. section 360bbb-3(b)(1), unless the authorization is terminated or revoked sooner. Performed at [facility]  [facility], [place] [address] [place], [place] [address]   Urine culture     Status: None   Collection Time: 03/27/19  4:18 PM  Result Value Ref Range Status   Specimen Description URINE, RANDOM  Final   Special Requests NONE  Final   Culture   Final    NO GROWTH Performed at [facility], [place] [address], [place], [place] [address]    Report Status 03/28/2019 FINAL  Final  Culture, sputum-assessment     Status: None   Collection Time: 03/28/19  3:09 AM  Result Value Ref Range Status   Specimen Description SPUTUM  Final   Special Requests NONE  Final   Sputum evaluation   Final    THIS SPECIMEN IS ACCEPTABLE FOR SPUTUM CULTURE Performed at [facility], [place] [address], [place], [place] [address]    Report Status 03/28/2019 FINAL  Final  Culture, respiratory     Status: None (Preliminary result)   Collection Time: 03/28/19  3:09 AM  Result Value Ref Range Status   Specimen Description SPUTUM  Final   Special Requests NONE Reflexed from [id number]  Final   Gram Stain   Final    RARE WBC PRESENT, PREDOMINANTLY PMN RARE SQUAMOUS EPITHELIAL CELLS PRESENT FEW GRAM POSITIVE RODS FEW GRAM NEGATIVE COCCOBACILLI    Culture   Final    CULTURE REINCUBATED FOR BETTER GROWTH Performed at [facility], [place] [address] [place], [place] [address]    Report Status PENDING  Incomplete     Studies: No results found.  Scheduled Meds: . ARIPiprazole  20 mg Oral QHS  . aspirin EC  81 mg Oral Daily  . atorvastatin  20 mg Oral QPC supper  . brimonidine  1 drop Both Eyes BID  . budesonide (PULMICORT) nebulizer solution  0.25 mg Nebulization BID  . Chlorhexidine Gluconate Cloth  6 each Topical Q0600  . donepezil  5 mg Oral QHS  . dorzolamide-timolol  1 drop Both Eyes BID  . ferrous sulfate  325 mg Oral QPC  supper  . heparin  5,000 Units Subcutaneous Q8H  . insulin aspart  0-5 Units Subcutaneous QHS  . insulin aspart  0-9 Units Subcutaneous TID WC  . ipratropium-albuterol  3 mL Nebulization Q6H  . lamoTRIgine  100 mg Oral QPC supper  . latanoprost  1 drop Both Eyes QHS  . methylPREDNISolone (SOLU-MEDROL) injection  40 mg Intravenous Q6H  . mupirocin ointment   Nasal BID  . sildenafil  20 mg Oral TID    Continuous Infusions: . ceFEPime (MAXIPIME) IV 1 g (03/29/19 1356)  . furosemide 120 mg (03/29/19 1804)  . vancomycin 1,000 mg (03/29/19 2132)     Time spent: 72mns, case discussed with palliative care over the phone I have personally reviewed and interpreted on  03/30/2019 daily labs, tele strips, imagings as discussed above under date review session and assessment and plans.  I reviewed all nursing notes, pharmacy notes, consultant notes,  vitals, pertinent old records  I have discussed plan of care as described above with RN , patient on 03/30/2019   FFlorencia ReasonsMD, PhD  [facility] Pager [phone] If 7PM-7AM, please contact night-coverage at [email or web address], password [email or web address]/16/2020, 8:03 AM  LOS: 3 days

## 2019-03-31 DIAGNOSIS — J9612 Chronic respiratory failure with hypercapnia: Secondary | ICD-10-CM

## 2019-03-31 DIAGNOSIS — J9611 Chronic respiratory failure with hypoxia: Secondary | ICD-10-CM

## 2019-03-31 LAB — BASIC METABOLIC PANEL
Anion gap: 14 (ref 5–15)
BUN: 54 mg/dL — ABNORMAL HIGH (ref 8–23)
CO2: 28 mmol/L (ref 22–32)
Calcium: 9.8 mg/dL (ref 8.9–10.3)
Chloride: 97 mmol/L — ABNORMAL LOW (ref 98–111)
Creatinine, Ser: 1.97 mg/dL — ABNORMAL HIGH (ref 0.44–1.00)
GFR calc Af Amer: 30 mL/min — ABNORMAL LOW (ref >60)
GFR calc non Af Amer: 25 mL/min — ABNORMAL LOW (ref >60)
Glucose, Bld: 154 mg/dL — ABNORMAL HIGH (ref 70–99)
Potassium: 3.6 mmol/L (ref 3.5–5.1)
Sodium: 139 mmol/L (ref 135–145)

## 2019-03-31 LAB — CBC WITH DIFFERENTIAL/PLATELET
Abs Immature Granulocytes: 0.03 10*3/uL (ref 0.00–0.07)
Basophils Absolute: 0 10*3/uL (ref 0.0–0.1)
Basophils Relative: 0 %
Eosinophils Absolute: 0 10*3/uL (ref 0.0–0.5)
Eosinophils Relative: 0 %
HCT: 42.1 % (ref 36.0–46.0)
Hemoglobin: 12.9 g/dL (ref 12.0–15.0)
Immature Granulocytes: 0 %
Lymphocytes Relative: 4 %
Lymphs Abs: 0.4 10*3/uL — ABNORMAL LOW (ref 0.7–4.0)
MCH: 26.8 pg (ref 26.0–34.0)
MCHC: 30.6 g/dL (ref 30.0–36.0)
MCV: 87.5 fL (ref 80.0–100.0)
Monocytes Absolute: 0.3 10*3/uL (ref 0.1–1.0)
Monocytes Relative: 3 %
Neutro Abs: 8.7 10*3/uL — ABNORMAL HIGH (ref 1.7–7.7)
Neutrophils Relative %: 93 %
Platelets: 560 10*3/uL — ABNORMAL HIGH (ref 150–400)
RBC: 4.81 MIL/uL (ref 3.87–5.11)
RDW: 19 % — ABNORMAL HIGH (ref 11.5–15.5)
WBC: 9.5 10*3/uL (ref 4.0–10.5)
nRBC: 0 % (ref 0.0–0.2)

## 2019-03-31 LAB — BLOOD GAS, ARTERIAL
Acid-Base Excess: 6.2 mmol/L — ABNORMAL HIGH (ref 0.0–2.0)
Bicarbonate: 30.1 mmol/L — ABNORMAL HIGH (ref 20.0–28.0)
Drawn by: 27011
FIO2: 15
O2 Saturation: 74.5 %
Patient temperature: 98.6
pCO2 arterial: 42.3 mmHg (ref 32.0–48.0)
pH, Arterial: 7.465 — ABNORMAL HIGH (ref 7.350–7.450)
pO2, Arterial: 42.7 mmHg — ABNORMAL LOW (ref 83.0–108.0)

## 2019-03-31 LAB — MAGNESIUM: Magnesium: 2.1 mg/dL (ref 1.7–2.4)

## 2019-03-31 LAB — GLUCOSE, CAPILLARY
Glucose-Capillary: 170 mg/dL — ABNORMAL HIGH (ref 70–99)
Glucose-Capillary: 176 mg/dL — ABNORMAL HIGH (ref 70–99)
Glucose-Capillary: 177 mg/dL — ABNORMAL HIGH (ref 70–99)
Glucose-Capillary: 202 mg/dL — ABNORMAL HIGH (ref 70–99)

## 2019-03-31 MED ORDER — METHYLPREDNISOLONE SODIUM SUCC 40 MG IJ SOLR
20.0000 mg | INTRAMUSCULAR | Status: DC
  Administered 2019-04-01 – 2019-04-02 (×2): 20 mg via INTRAVENOUS
  Filled 2019-03-31 (×2): qty 1

## 2019-03-31 NOTE — Progress Notes (Signed)
Patient was found on 15L [place] cannu;a satting in the 70's. Rt placed patient on BiPAP and she is now resting comfortably

## 2019-03-31 NOTE — Progress Notes (Signed)
PROGRESS NOTE  Elizabeth Thompson BJS:[id number] DOB: 08/30/1951 DOA: 03/27/2019 PCP: Everardo Beals, NP  Brief Narrative:  Patient is a 68 year old African-American female with past medical history significant for COPD, severe pulmonary hypertension, chronic respiratory failure on 6 L of oxygen. Patient has been admitted repeatedly due to acute on chronic respiratory failure. Other past medical history includes hypertension, hyperlipidemia, tobacco use, GERD, diabetes mellitus without complications and bipolar disorder amongstother medical problems. Patient was discharged from this hospital on March 09, 2019. The discharge plan included for the patient to be discharged back home with home hospice. Apparently, 1 of the patient's care went to see the patient earlier today and find out that the O2 sat was significantly decreased. The patient is requiring 15 L of supplemental oxygen for now. Blood gas done revealed pH of 7.39, PCO2 of 43.8 and PO2 of 22 (suspect that this is venous gas). Cardiac BNP is elevated at 593.2, however, patient serum creatinine is 1.99. Troponin is 0.03. Procalcitonin is 0.16. Chest x-ray and CT reveal borderline cardiomegaly, pulmonary vascular congestion and slightly increased non-specific bibasilar airspace opacities. Patient be admitted for further assessment and management. No headache, no neck pain, no URI symptoms, no chest pain, and no productive cough, no GI symptoms and no urinary symptoms.  ED Course:Patient is currently on supplemental oxygen. Patient has been given nebulizer treatment and IV Solu-Medrol. ER provider discussed case with the ICU team, and they were advised for the hospitalist team to admit patient.    HPI/Recap of past 24 hours:   Patient is alert, interactive, she is on HFNC at 15liters now She denies pain, no fever, no cough, no edema 3.5liter urine output last 24hrs, bp/cr stable on current dose of lasix, urine is clear   Blood glucose improved on tapered dose  steroids   Assessment/Plan: Active Problems:   Diabetes mellitus type 2, noninsulin dependent (HCC)   COPD with acute exacerbation (HCC)   Acute on chronic respiratory failure (HCC)   Chronic respiratory failure with hypoxia and hypercapnia (HCC)   CKD (chronic kidney disease) stage 4, GFR 15-29 ml/min (HCC)   FTT (failure to thrive) in adult  Acute on Chronic Hypoxic Respiratory Failure, Suspect this is related to heart failure and pulmonary hypertension.  She's being treated possible COPD exacerbation/pneumonia as well. -Pt on 6 L at baseline -She is started on bipap due to sats range from 60's to the 80's on a NRB on admission -she is started on high dose lasix 120 mg BID (she is on lasix 37m daily), good urine output, bp /cr tolerating , will continue for another 24hrs -no wheezing today, will continue to decrease solumedrol,  Continue prn and scheduled nebs -she is started on on vanc/cefepime on admission for possible infection, she does not appear septic, no fever, no leukocytosis,  Lactic acid wnl, procalcitonin low at 0.16 -she isTested negative for novel coronavirus (no fevers, travel, or known sick contacts) on 4/13 - respiratory cultures Consistent with normal respiratory flora. , blood culture 1/2 coag negative staph, likely contamination, -vanc/cefepime d/ced, change to doxycycline (MRSA screening +) -Palliative care consulted,   Patient will benefit from going home on NIV for advanced copd,  Heart failure and pulmonary hypertension. The use of NIV will help to prevent co2 accumulation , also help with significant hypoxia , provide comfort, reduce risk of exacerbation and recurrent hospitalization when used at night and during the day as needed. Patient will need these advanced settings in conjunction with her current  medication regimen, bipap is not an option due to its functional limitations and the severity of the patient's condition.  Failure to have NIV available for use over a 24hour period could lead to death.   Severe pulmonary hypertension: Continue sildenofil OPSUMIT d/c'd by cards at last hospitalization, niece request cardiology opinion on restarting opsumit Diuresis as above Cardiology Dr Einar Gip input appreciated who recommend palliative care, detail please refer to original note  COPD: On nebs/steroids as noted above No wheezing today, continue to taper steroids  HFpEF: EF normal in 06/3577, mild diastolic dysfunction On lasix 80 mg daily at home, aggressive diuresis at this time as noted above  Hypokalemia k normalized, monitor k while on lasix  Hypertension: Episode of hypotension  Initially on presentation, Stable now Continue to hold amlodipine Aggressive diuresis as noted above Continue to monitor for now.  noninsulin dependent dm2, controlled Home meds amaryl/ metfromin held , recent a1c in 02/2019 was 6.5 On ssi here  Chronic Kidney Disease Stage IV: baseline creatinine appears to be between 2-2.5.  Follow closely with aggressive diuresis as noted above.  Anemia of chronic disease -hgb 10.9   Psych: she is stable on abilify, lamictal , aricept continued. She is aaox3, she can tell me her niece's phone number   Obesity: Body mass index is 31.96 kg/m.   FTT, poor prognosis, will need home health, will benefit from community palliative care service  Code Status: DNR  Family Communication: patient and HPOA over the phone  Disposition Plan: home with home health tomorrow   Consultants:  Palliative care  cardiology  Procedures:  bipap  Antibiotics:  Vanc/cefepime from admission to 4/16  Doxycycline from 4/16 to    Objective: BP 140/85 (BP Location: Right Arm)   Pulse 85   Temp 97.9 F (36.6 C) (Oral)   Resp 18   Ht 5' 3.25" (1.607 m)   Wt 82.5 kg   SpO2 (!) 84%   BMI 31.96 kg/m   Intake/Output Summary (Last 24 hours) at 03/31/2019 1550 Last data filed at  03/31/2019 1400 Gross per 24 hour  Intake 691.92 ml  Output 2650 ml  Net -1958.08 ml   Filed Weights   03/27/19 1528 03/28/19 0028 03/31/19 0400  Weight: 86.6 kg 88.2 kg 82.5 kg    Exam: Patient is examined daily including today on 03/31/2019, exams remain the same as of yesterday except that has changed    General:  NAD, on HFNC, aaox3  Cardiovascular: RRR  Respiratory: diminished, no wheezing, no rales, no rhonchi  Abdomen: Soft/ND/NT, positive BS  Musculoskeletal: No Edema  Neuro: alert, oriented   Data Reviewed: Basic Metabolic Panel: Recent Labs  Lab 03/27/19 1531 03/27/19 2021 03/28/19 0453 03/29/19 0513 03/30/19 0406 03/31/19 0322  NA 139  --  137 139 140 139  K 3.3*  --  4.2 4.2 3.9 3.6  CL 100  --  100 102 101 97*  CO2 23  --  22 20* 23 28  GLUCOSE 150*  --  158* 139* 135* 154*  BUN 22  --  30* 46* 54* 54*  CREATININE 1.99*  --  2.31* 2.48* 2.30* 1.97*  CALCIUM 9.5  --  9.2 9.1 9.4 9.8  MG  --  2.1  --  2.3 2.3 2.1  PHOS  --  3.8  --   --   --   --    Liver Function Tests: Recent Labs  Lab 03/27/19 1531 03/29/19 0513  AST 20 22  ALT 12  18  ALKPHOS 64 57  BILITOT 0.6 0.9  PROT 7.0 6.8  ALBUMIN 3.9 3.7   No results for input(s): LIPASE, AMYLASE in the last 168 hours. No results for input(s): AMMONIA in the last 168 hours. CBC: Recent Labs  Lab 03/27/19 1531 03/28/19 0453 03/29/19 0513 03/30/19 0406 03/31/19 0322  WBC 7.7 5.2 4.3 4.3 9.5  NEUTROABS 6.2  --   --  3.7 8.7*  HGB 11.9* 11.3* 10.9* 11.4* 12.9  HCT 40.2 39.4 35.9* 37.0 42.1  MCV 92.4 91.0 89.8 89.2 87.5  PLT 425* 419* 441* 481* 560*   Cardiac Enzymes:   Recent Labs  Lab 03/27/19 1550  TROPONINI 0.03*   BNP (last 3 results) Recent Labs    02/24/19 1150 02/26/19 0632 03/27/19 1531  BNP 371.7* 547.8* 593.2*    ProBNP (last 3 results) No results for input(s): PROBNP in the last 8760 hours.  CBG: Recent Labs  Lab 03/30/19 1204 03/30/19 1620 03/30/19 2202  03/31/19 0754 03/31/19 1208  GLUCAP 139* 252* 189* 170* 176*    Recent Results (from the past 240 hour(s))  MRSA PCR Screening     Status: Abnormal   Collection Time: 03/27/19  1:09 AM  Result Value Ref Range Status   MRSA by PCR POSITIVE (A) NEGATIVE Final    Comment: CRITICAL RESULT CALLED TO, READ BACK BY AND VERIFIED WITH: RN,A. Simone Curia [id number]  THANEY Performed at [facility] [address] [place], [place] [address]   Blood Culture (routine x 2)     Status: Abnormal   Collection Time: 03/27/19  3:38 PM  Result Value Ref Range Status   Specimen Description BLOOD RIGHT FOREARM  Final   Special Requests   Final    BOTTLES DRAWN AEROBIC AND ANAEROBIC Blood Culture adequate volume   Culture  Setup Time   Final    GRAM POSITIVE COCCI IN CLUSTERS AEROBIC BOTTLE ONLY CRITICAL RESULT CALLED TO, READ BACK BY AND VERIFIED WITH:  J Beggs PHARMD 03/28/19 1751 JDW    Culture (A)  Final    STAPHYLOCOCCUS SPECIES (COAGULASE NEGATIVE) THE SIGNIFICANCE OF ISOLATING THIS ORGANISM FROM A SINGLE SET OF BLOOD CULTURES WHEN MULTIPLE SETS ARE DRAWN IS UNCERTAIN. PLEASE NOTIFY THE MICROBIOLOGY DEPARTMENT WITHIN ONE WEEK IF SPECIATION AND SENSITIVITIES ARE REQUIRED. Performed at [facility], [place] [address] [place], [place] [address]    Report Status 03/29/2019 FINAL  Final  Blood Culture ID Panel (Reflexed)     Status: Abnormal   Collection Time: 03/27/19  3:38 PM  Result Value Ref Range Status   Enterococcus species NOT DETECTED NOT DETECTED Final   Listeria monocytogenes NOT DETECTED NOT DETECTED Final   Staphylococcus species DETECTED (A) NOT DETECTED Final    Comment: Methicillin (oxacillin) resistant coagulase negative staphylococcus. Possible blood culture contaminant (unless isolated from more than one blood culture draw or clinical case suggests pathogenicity). No antibiotic treatment is indicated for blood  culture contaminants. CRITICAL RESULT CALLED TO, READ BACK  BY AND VERIFIED WITH: J [place] PHARMD 03/28/19 1751 JDW    Staphylococcus aureus (BCID) NOT DETECTED NOT DETECTED Final   Methicillin resistance DETECTED (A) NOT DETECTED Final    Comment: CRITICAL RESULT CALLED TO, READ BACK BY AND VERIFIED WITH: J Mayer PHARMD 03/28/19 1751 JDW    Streptococcus species NOT DETECTED NOT DETECTED Final   Streptococcus agalactiae NOT DETECTED NOT DETECTED Final   Streptococcus pneumoniae NOT DETECTED NOT DETECTED Final   Streptococcus pyogenes NOT DETECTED NOT DETECTED  Final   Acinetobacter baumannii NOT DETECTED NOT DETECTED Final   Enterobacteriaceae species NOT DETECTED NOT DETECTED Final   Enterobacter cloacae complex NOT DETECTED NOT DETECTED Final   Escherichia coli NOT DETECTED NOT DETECTED Final   Klebsiella oxytoca NOT DETECTED NOT DETECTED Final   Klebsiella pneumoniae NOT DETECTED NOT DETECTED Final   Proteus species NOT DETECTED NOT DETECTED Final   Serratia marcescens NOT DETECTED NOT DETECTED Final   Haemophilus influenzae NOT DETECTED NOT DETECTED Final   Neisseria meningitidis NOT DETECTED NOT DETECTED Final   Pseudomonas aeruginosa NOT DETECTED NOT DETECTED Final   Candida albicans NOT DETECTED NOT DETECTED Final   Candida glabrata NOT DETECTED NOT DETECTED Final   Candida krusei NOT DETECTED NOT DETECTED Final   Candida parapsilosis NOT DETECTED NOT DETECTED Final   Candida tropicalis NOT DETECTED NOT DETECTED Final    Comment: Performed at [facility], [place] [address], [place], [place] [address]  Blood Culture (routine x 2)     Status: None (Preliminary result)   Collection Time: 03/27/19  3:39 PM  Result Value Ref Range Status   Specimen Description BLOOD RIGHT ANTECUBITAL  Final   Special Requests   Final    BOTTLES DRAWN AEROBIC ONLY Blood Culture results may not be optimal due to an inadequate volume of blood received in culture bottles   Culture   Final    NO GROWTH 4 DAYS Performed at [facility], [place]  [address] [place], [place] [address]    Report Status PENDING  Incomplete  [facility] order, Performed in [facility] hospital lab)     Status: None   Collection Time: 03/27/19  3:46 PM  Result Value Ref Range Status   SARS Coronavirus 2 NEGATIVE NEGATIVE Final    Comment: (NOTE) If result is NEGATIVE SARS-CoV-2 target nucleic acids are NOT DETECTED. The SARS-CoV-2 RNA is generally detectable in upper and lower  respiratory specimens during the acute phase of infection. The lowest  concentration of SARS-CoV-2 viral copies this assay can detect is 250  copies / mL. A negative result does not preclude SARS-CoV-2 infection  and should not be used as the sole basis for treatment or other  patient management decisions.  A negative result may occur with  improper specimen collection / handling, submission of specimen other  than nasopharyngeal swab, presence of viral mutation(s) within the  areas targeted by this assay, and inadequate number of viral copies  (<250 copies / mL). A negative result must be combined with clinical  observations, patient history, and epidemiological information. If result is POSITIVE SARS-CoV-2 target nucleic acids are DETECTED. The SARS-CoV-2 RNA is generally detectable in upper and lower  respiratory specimens dur ing the acute phase of infection.  Positive  results are indicative of active infection with SARS-CoV-2.  Clinical  correlation with patient history and other diagnostic information is  necessary to determine patient infection status.  Positive results do  not rule out bacterial infection or co-infection with other viruses. If result is PRESUMPTIVE POSTIVE SARS-CoV-2 nucleic acids MAY BE PRESENT.   A presumptive positive result was obtained on the submitted specimen  and confirmed on repeat testing.  While 2019 novel coronavirus  (SARS-CoV-2) nucleic acids may be present in the submitted sample  additional confirmatory testing may be  necessary for epidemiological  and / or clinical management purposes  to differentiate between  SARS-CoV-2 and other Sarbecovirus currently known to infect humans.  If clinically indicated  additional testing with an alternate test  methodology [phone]) is advised. The SARS-CoV-2 RNA is generally  detectable in upper and lower respiratory sp ecimens during the acute  phase of infection. The expected result is Negative. Fact Sheet for Patients:  StrictlyIdeas.no Fact Sheet for Healthcare Providers: BankingDealers.co.za This test is not yet approved or cleared by the [place] FDA and has been authorized for detection and/or diagnosis of SARS-CoV-2 by FDA under an Emergency Use Authorization (EUA).  This EUA will remain in effect (meaning this test can be used) for the duration of the COVID-19 declaration under Section 564(b)(1) of the Act, 21 U.S.C. section 360bbb-3(b)(1), unless the authorization is terminated or revoked sooner. Performed at [facility], [place] [address] [place], [place] [address]   Urine culture     Status: None   Collection Time: 03/27/19  4:18 PM  Result Value Ref Range Status   Specimen Description URINE, RANDOM  Final   Special Requests NONE  Final   Culture   Final    NO GROWTH Performed at [facility], [place] [address], [place], [place] [address]    Report Status 03/28/2019 FINAL  Final  Culture, sputum-assessment     Status: None   Collection Time: 03/28/19  3:09 AM  Result Value Ref Range Status   Specimen Description SPUTUM  Final   Special Requests NONE  Final   Sputum evaluation   Final    THIS SPECIMEN IS ACCEPTABLE FOR SPUTUM CULTURE Performed at [facility], [place] [address] [place], [place] [address]    Report Status 03/28/2019 FINAL  Final  Culture, respiratory     Status: None   Collection Time: 03/28/19  3:09 AM  Result Value Ref Range Status   Specimen Description  SPUTUM  Final   Special Requests NONE Reflexed from [id number]  Final   Gram Stain   Final    RARE WBC PRESENT, PREDOMINANTLY PMN RARE SQUAMOUS EPITHELIAL CELLS PRESENT FEW GRAM POSITIVE RODS FEW GRAM NEGATIVE COCCOBACILLI    Culture   Final    MODERATE Consistent with normal respiratory flora. Performed at [facility], [place] [address] [place], [place] [address]    Report Status 03/30/2019 FINAL  Final     Studies: No results found.  Scheduled Meds: . ARIPiprazole  20 mg Oral QHS  . aspirin EC  81 mg Oral Daily  . atorvastatin  20 mg Oral QPC supper  . brimonidine  1 drop Both Eyes BID  . budesonide (PULMICORT) nebulizer solution  0.25 mg Nebulization BID  . Chlorhexidine Gluconate Cloth  6 each Topical Q0600  . donepezil  5 mg Oral QHS  . dorzolamide-timolol  1 drop Both Eyes BID  . doxycycline  100 mg Oral Q12H  . ferrous sulfate  325 mg Oral QPC supper  . heparin  5,000 Units Subcutaneous Q8H  . insulin aspart  0-5 Units Subcutaneous QHS  . insulin aspart  0-9 Units Subcutaneous TID WC  . ipratropium-albuterol  3 mL Nebulization Q6H  . lamoTRIgine  100 mg Oral QPC supper  . latanoprost  1 drop Both Eyes QHS  . [START ON 04/01/2019] methylPREDNISolone (SOLU-MEDROL) injection  20 mg Intravenous Q24H  . mupirocin ointment   Nasal BID  . sildenafil  20 mg Oral TID    Continuous Infusions: . furosemide 120 mg (03/31/19 0944)     Time spent: 25mns, case discussed with palliative care over the phone I have personally reviewed and  interpreted on  03/31/2019 daily labs, tele strips, imagings as discussed above under date review session and assessment and plans.  I reviewed all nursing notes, pharmacy notes, consultant notes,  vitals, pertinent old records  I have discussed plan of care as described above with RN , patient on 03/31/2019   Florencia Reasons MD, PhD  [facility] Pager [phone]. If 7PM-7AM, please contact night-coverage at [email or web address], password [facility]  03/31/2019, 3:50 PM  LOS: 4 days

## 2019-03-31 NOTE — Progress Notes (Signed)
PMT Follow-up:  Patient followed by Vinie Sill, PMT NP. Chart reviewed and discussed with Dr. Erlinda Hong and Hassan Rowan, RN CM. Also spoke with niece, [name], via telephone. Patient is off BiPAP and currently on HFNC 15L. After conversation with Dr. Einar Gip this AM, patient's niece wishes to take her home with home health/palliative services. She is not yet ready to initiate hospice services again. Niece understands she can transition to hospice services at any time. Olin Hauser is most worried about receiving oxygen tanks and trilogy and ensuring they are set up appropriately prior to her aunt discharging. Olin Hauser asks PMT provider questions regarding oxygen tanks and trilogy. Deferred questions to RN CM. Possible discharge home tomorrow, 4/18.  Non-face-to-face time with care team including physician and patient's family. 25 minutes.  Ihor Dow, DNP, FNP-C Palliative Medicine Team  Phone: [phone] Fax: [phone]

## 2019-03-31 NOTE — TOC Transition Note (Signed)
Transition of Care [facility]) - CM/SW Discharge Note   Patient Details  Name: Elizabeth Thompson MRN: [id number] Date of Birth: 06-04-1951  [facility]) CM/SW Contact:  Bethena Roys, RN Phone Number: 03/31/2019, 12:17 PM   Clinical Narrative: Pt continues on 15 liters high flow oxygen. Plan will be to transition home via ambulance once stable. CM did reach out to Niece Elizabeth Thompson. Elizabeth Thompson wants to continue with Palliative Services with [facility] and  Use HH Services with [facility]- Referral made to [employer] for RN, [employer]. Elizabeth Thompson is concerned that patient does not have adequate oxygen supply for home. Patient is using 2 concentrators in the home with tubing connected. CM did speak with [employer] Liaison and questioned the 2 concentrators. CM asked for an Oxymizer to support the high flow 15 liters and we discussed the Trilogy. Pt will need ABG if Trilogy is an option. CM will discuss with MD. Address is correct for [facility] transport home. Pt will stay overnight to monitor oxygen saturation. Please note that patient will need her nebulizer mask at bedside sent home.    1307 03-31-19 MD placed order for ABG- Trilogy order signed. Patient should be able to get Trilogy delivered to home this evening.    Final next level of care: [facility] via [facility]) Barriers to Discharge: Continued Medical Work up(Making sure she has adequate oxygen in the home)   Patient Goals and CMS Choice Patient states their goals for this hospitalization and ongoing recovery are:: "to get back home with family"      Discharge Plan and Services In-house Referral: Hospice / Palliative Care(CM asked for Palliative Consult 03-28-19) Discharge Planning Services: CM Consult Post Acute Care Choice: Home Health          DME Arranged: Oxygen, Trilogy Vent, Oxymizer DME Agency: [facility] HH Arranged: RN, Disease Management, Nurse's Aide [place] Agency: [facility] ([facility] [employer]   Social Determinants of Health (SDOH) Interventions     Readmission Risk Interventions No flowsheet data found.

## 2019-04-01 DIAGNOSIS — I5033 Acute on chronic diastolic (congestive) heart failure: Secondary | ICD-10-CM

## 2019-04-01 LAB — CULTURE, BLOOD (ROUTINE X 2): Culture: NO GROWTH

## 2019-04-01 LAB — CBC WITH DIFFERENTIAL/PLATELET
Abs Immature Granulocytes: 0.02 10*3/uL (ref 0.00–0.07)
Basophils Absolute: 0 10*3/uL (ref 0.0–0.1)
Basophils Relative: 0 %
Eosinophils Absolute: 0 10*3/uL (ref 0.0–0.5)
Eosinophils Relative: 0 %
HCT: 45.5 % (ref 36.0–46.0)
Hemoglobin: 13.8 g/dL (ref 12.0–15.0)
Immature Granulocytes: 0 %
Lymphocytes Relative: 8 %
Lymphs Abs: 0.8 10*3/uL (ref 0.7–4.0)
MCH: 26.5 pg (ref 26.0–34.0)
MCHC: 30.3 g/dL (ref 30.0–36.0)
MCV: 87.3 fL (ref 80.0–100.0)
Monocytes Absolute: 0.6 10*3/uL (ref 0.1–1.0)
Monocytes Relative: 6 %
Neutro Abs: 8.6 10*3/uL — ABNORMAL HIGH (ref 1.7–7.7)
Neutrophils Relative %: 86 %
Platelets: 570 10*3/uL — ABNORMAL HIGH (ref 150–400)
RBC: 5.21 MIL/uL — ABNORMAL HIGH (ref 3.87–5.11)
RDW: 18.5 % — ABNORMAL HIGH (ref 11.5–15.5)
WBC: 10.1 10*3/uL (ref 4.0–10.5)
nRBC: 0 % (ref 0.0–0.2)

## 2019-04-01 LAB — BASIC METABOLIC PANEL
Anion gap: 16 — ABNORMAL HIGH (ref 5–15)
BUN: 58 mg/dL — ABNORMAL HIGH (ref 8–23)
CO2: 27 mmol/L (ref 22–32)
Calcium: 10 mg/dL (ref 8.9–10.3)
Chloride: 95 mmol/L — ABNORMAL LOW (ref 98–111)
Creatinine, Ser: 2.22 mg/dL — ABNORMAL HIGH (ref 0.44–1.00)
GFR calc Af Amer: 26 mL/min — ABNORMAL LOW (ref >60)
GFR calc non Af Amer: 22 mL/min — ABNORMAL LOW (ref >60)
Glucose, Bld: 119 mg/dL — ABNORMAL HIGH (ref 70–99)
Potassium: 3.9 mmol/L (ref 3.5–5.1)
Sodium: 138 mmol/L (ref 135–145)

## 2019-04-01 LAB — GLUCOSE, CAPILLARY
Glucose-Capillary: 172 mg/dL — ABNORMAL HIGH (ref 70–99)
Glucose-Capillary: 133 mg/dL — ABNORMAL HIGH (ref 70–99)
Glucose-Capillary: 122 mg/dL — ABNORMAL HIGH (ref 70–99)
Glucose-Capillary: 257 mg/dL — ABNORMAL HIGH (ref 70–99)

## 2019-04-01 MED ORDER — IPRATROPIUM-ALBUTEROL 0.5-2.5 (3) MG/3ML IN SOLN
3.0000 mL | Freq: Three times a day (TID) | RESPIRATORY_TRACT | Status: DC
  Administered 2019-04-01 – 2019-04-03 (×8): 3 mL via RESPIRATORY_TRACT
  Filled 2019-04-01 (×8): qty 3

## 2019-04-01 MED ORDER — FUROSEMIDE 10 MG/ML IJ SOLN
80.0000 mg | Freq: Two times a day (BID) | INTRAMUSCULAR | Status: DC
  Administered 2019-04-01 – 2019-04-02 (×2): 80 mg via INTRAVENOUS
  Filled 2019-04-01 (×2): qty 8

## 2019-04-01 MED ORDER — GUAIFENESIN ER 600 MG PO TB12
600.0000 mg | ORAL_TABLET | Freq: Two times a day (BID) | ORAL | Status: DC
  Administered 2019-04-01 – 2019-04-03 (×5): 600 mg via ORAL
  Filled 2019-04-01 (×5): qty 1

## 2019-04-01 NOTE — Care Management (Addendum)
Spoke with patient's niece, Olin Hauser, about status of patient's DME and provided the following information:  Per [place], the patient does not qualify for a Trilogy NIV because her CO2 is not greater than 52.  After speaking with Dr. Erlinda Hong and the RT, there is no other option than to collect the ABG on 15lpm [place] because the patient cannot tolerate being on RA or even less O2.  A bedside spirometry test can be done on Monday to measure the FEV1.  The patient may qualify for the NIV with this measurement.  It is noted that the patient had a PFT in 2017 where the FEV1 result was not close to the required parameter.  Also, the patient has no ABG results where the CO2 is greater than 46.  [facility] will not continue to provide 15lpm [place] without a NIV in the home due to liability.  Therefore, going home with home health may not be an option if a NIV cannot be obtained.  Per Harmon Pier with Hospice, [facility] could resume services with the patient and provide a BIPAP if the BIPAP settings are determined before discharge.   Given this information, Olin Hauser did not want to resume services with hospice and wanted to check with other DME companies and get the bedside spirometry test on Monday. Olin Hauser understands that the DME oxygen would have to be changed to the same company. Olin Hauser states that if the patient does not qualify for the NIV after these other options are exhausted, that they will go home with hospice as there will be no other choice.  Called to discuss with Ronnell [place] of [facility].  Information faxed for review.  Jeneen Rinks states that the low PO2 on the ABG may allow patient to qualify for a NIV, but [facility] Medicare requires a pre-authorization and we will not know until Monday/Tuesday if is approved.  He will review labs/notes tonight with supervisor and attempt to reach [facility] tomorrow.

## 2019-04-01 NOTE — Progress Notes (Signed)
PROGRESS NOTE  Elizabeth Thompson:[id number] DOB: 1951/10/24 DOA: 03/27/2019 PCP: Everardo Beals, NP  Brief Narrative:  Patient is a 68 year old African-American female with past medical history significant for COPD, severe pulmonary hypertension, chronic respiratory failure on 6 L of oxygen. Patient has been admitted repeatedly due to acute on chronic respiratory failure. Other past medical history includes hypertension, hyperlipidemia, tobacco use, GERD, diabetes mellitus without complications and bipolar disorder amongstother medical problems. Patient was discharged from this hospital on March 09, 2019. The discharge plan included for the patient to be discharged back home with home hospice. Apparently, 1 of the patient's care went to see the patient earlier today and find out that the O2 sat was significantly decreased. The patient is requiring 15 L of supplemental oxygen for now. Blood gas done revealed pH of 7.39, PCO2 of 43.8 and PO2 of 22 (suspect that this is venous gas). Cardiac BNP is elevated at 593.2, however, patient serum creatinine is 1.99. Troponin is 0.03. Procalcitonin is 0.16. Chest x-ray and CT reveal borderline cardiomegaly, pulmonary vascular congestion and slightly increased non-specific bibasilar airspace opacities. Patient be admitted for further assessment and management. No headache, no neck pain, no URI symptoms, no chest pain, and no productive cough, no GI symptoms and no urinary symptoms.  ED Course:Patient is currently on supplemental oxygen. Patient has been given nebulizer treatment and IV Solu-Medrol. ER provider discussed case with the ICU team, and they were advised for the hospitalist team to admit patient.    HPI/Recap of past 24 hours:   Patient is alert, interactive, she is on HFNC at 15liters She denies pain, no fever, no cough, no edema 1.5liter urine output last 24hrs,   Blood glucose improved on tapered dose  steroids    Assessment/Plan: Active Problems:   Diabetes mellitus type 2, noninsulin dependent (HCC)   COPD with acute exacerbation (HCC)   Acute on chronic respiratory failure (HCC)   Chronic respiratory failure with hypoxia and hypercapnia (HCC)   CKD (chronic kidney disease) stage 4, GFR 15-29 ml/min (HCC)   FTT (failure to thrive) in adult  Acute on Chronic Hypoxic Respiratory Failure, Suspect this is related to heart failure and pulmonary hypertension.  She's being treated possible COPD exacerbation/pneumonia as well. -Pt on 6 L at baseline -She is started on bipap due to sats range from 60's to the 80's on a NRB on admission -she is started on high dose lasix 120 mg BID (she is on lasix 46m daily) snce admission with good urine output, lasix dose decreased to 864mbid, likely can go home on this dose -no wheezing ,  continue to decrease solumedrol,  Continue prn and scheduled nebs -she is started on on vanc/cefepime on admission for possible infection, she does not appear septic, no fever, no leukocytosis,  Lactic acid wnl, procalcitonin low at 0.16 -she isTested negative for novel coronavirus (no fevers, travel, or known sick contacts) on 4/13 - respiratory cultures Consistent with normal respiratory flora. , blood culture 1/2 coag negative staph, likely contamination, -vanc/cefepime d/ced, change to doxycycline (MRSA screening +) -Palliative care consulted,   Patient will benefit from going home on NIV for advanced copd,  Heart failure and pulmonary hypertension. The use of NIV will help to prevent co2 accumulation , also help with significant hypoxia , provide comfort, reduce risk of exacerbation and recurrent hospitalization when used at night and during the day as needed. Patient will need these advanced settings in conjunction with her current medication regimen, bipap  is not an option due to its functional limitations and the severity of the patient's condition. Failure to have NIV available  for use over a 24hour period could lead to death.   Insurance company requires abg on room air in order to approve trilogy NIV, I have discussed with HCPOA, patient is oxygen dependent, it is not safe to perform ABG on room air. HCPOA decided not to  Do abg on room air.  Per phone conversation with  Luane [employer] on 4/18 am: [name] states she wants palliative care follow them , she is now ok with hospice following then at home as well. [name] does not want get patient home until all the equipment delivered. Case manager informed, case discussed with case manger, a alternative to trilogy would be home bipap if pak ok with home hospice Case manager will discuss with HPOA [name] and arrange accordingly, patient can go home if home oxygen equipment set up, Sunday or Monday.   Severe pulmonary hypertension: Continue sildenofil OPSUMIT d/c'd by cards at last hospitalization, niece request cardiology opinion on restarting opsumit Diuresis as above Cardiology Dr Einar Gip input appreciated who recommend palliative care, detail please refer to original note  COPD: On nebs/steroids as noted above No wheezing , continue to taper steroids  HFpEF: EF normal in 03/9200, mild diastolic dysfunction On lasix 80 mg daily at home, she received lasix 144m bid iv here with good response, lasix dose changed to 861mbid on 4/18, she likely will benefit from discharge on lasix 8030mid, monitor bp and cr.  Hypokalemia k normalized, monitor k while on lasix  Hypertension: Episode of hypotension  Initially on presentation, Stable now Continue to hold amlodipine Aggressive diuresis as noted above Continue to monitor for now.  noninsulin dependent dm2, controlled Home meds amaryl/ metfromin held , recent a1c in 02/2019 was 6.5 On ssi here  Chronic Kidney Disease Stage IV: baseline creatinine appears to be between 2-2.5.  Follow closely with aggressive diuresis as noted above.  Anemia of chronic disease  -anemia partly  due to hemodilution in the setting of volume overload,  -hgb normalized after diuresis,  Psych: she is stable on abilify, lamictal , aricept continued. She is aaox3, she can tell me her niece's phone number   Obesity: Body mass index is 31.27 kg/m.   FTT, poor prognosis, home health/ community palliative care service/ +/- hospice if HPOA agrees   Code Status: DNR  Family Communication: patient and HPOA over the phone  Disposition Plan: home on Sunday or Monday  once equipment delivered to home    Consultants:  Palliative care  cardiology  Procedures:  bipap  Antibiotics:  Vanc/cefepime from admission to 4/16  Doxycycline from 4/16 to 4/17   Objective: BP 127/90 (BP Location: Right Arm)   Pulse 84   Temp (!) 97.2 F (36.2 C) (Oral)   Resp 18   Ht 5' 3.25" (1.607 m)   Wt 80.7 kg   SpO2 95%   BMI 31.27 kg/m   Intake/Output Summary (Last 24 hours) at 04/01/2019 1450 Last data filed at 04/01/2019 0650 Gross per 24 hour  Intake 450 ml  Output 1525 ml  Net -1075 ml   Filed Weights   03/28/19 0028 03/31/19 0400 04/01/19 0500  Weight: 88.2 kg 82.5 kg 80.7 kg    Exam: Patient is examined daily including today on 04/01/2019, exams remain the same as of yesterday except that has changed    General:  NAD, on HFNC, aaox3  Cardiovascular:  RRR  Respiratory: diminished, no wheezing, no rales, no rhonchi  Abdomen: Soft/ND/NT, positive BS  Musculoskeletal: No Edema  Neuro: alert, oriented   Data Reviewed: Basic Metabolic Panel: Recent Labs  Lab 03/27/19 2021 03/28/19 0453 03/29/19 0513 03/30/19 0406 03/31/19 0322 04/01/19 0327  NA  --  137 139 140 139 138  K  --  4.2 4.2 3.9 3.6 3.9  CL  --  100 102 101 97* 95*  CO2  --  22 20* _0 GLUCOSE  --  158* 139* 135* 154* 119*  BUN  --  30* 46* 54* 54* 58*  CREATININE  --  2.31* 2.48* 2.30* 1.97* 2.22*  CALCIUM  --  9.2 9.1 9.4 9.8 10.0  MG 2.1  --  2.3 2.3 2.1  --   PHOS 3.8  --   --   --    --   --    Liver Function Tests: Recent Labs  Lab 03/27/19 1531 03/29/19 0513  AST 20 22  ALT 12 18  ALKPHOS 64 57  BILITOT 0.6 0.9  PROT 7.0 6.8  ALBUMIN 3.9 3.7   No results for input(s): LIPASE, AMYLASE in the last 168 hours. No results for input(s): AMMONIA in the last 168 hours. CBC: Recent Labs  Lab 03/27/19 1531 03/28/19 0453 03/29/19 0513 03/30/19 0406 03/31/19 0322 04/01/19 0327  WBC 7.7 5.2 4.3 4.3 9.5 10.1  NEUTROABS 6.2  --   --  3.7 8.7* 8.6*  HGB 11.9* 11.3* 10.9* 11.4* 12.9 13.8  HCT 40.2 39.4 35.9* 37.0 42.1 45.5  MCV 92.4 91.0 89.8 89.2 87.5 87.3  PLT 425* 419* 441* 481* 560* 570*   Cardiac Enzymes:   Recent Labs  Lab 03/27/19 1550  TROPONINI 0.03*   BNP (last 3 results) Recent Labs    02/24/19 1150 02/26/19 0632 03/27/19 1531  BNP 371.7* 547.8* 593.2*    ProBNP (last 3 results) No results for input(s): PROBNP in the last 8760 hours.  CBG: Recent Labs  Lab 03/31/19 1208 03/31/19 1618 03/31/19 2201 04/01/19 0732 04/01/19 1151  GLUCAP 176* 177* 202* 172* 133*    Recent Results (from the past 240 hour(s))  MRSA PCR Screening     Status: Abnormal   Collection Time: 03/27/19  1:09 AM  Result Value Ref Range Status   MRSA by PCR POSITIVE (A) NEGATIVE Final    Comment: CRITICAL RESULT CALLED TO, READ BACK BY AND VERIFIED WITH: RN,A. Simone Curia [id number] _1  THANEY Performed at [facility], [place] [address] [place], [place] [address]   Blood Culture (routine x 2)     Status: Abnormal   Collection Time: 03/27/19  3:38 PM  Result Value Ref Range Status   Specimen Description BLOOD RIGHT FOREARM  Final   Special Requests   Final    BOTTLES DRAWN AEROBIC AND ANAEROBIC Blood Culture adequate volume   Culture  Setup Time   Final    GRAM POSITIVE COCCI IN CLUSTERS AEROBIC BOTTLE ONLY CRITICAL RESULT CALLED TO, READ BACK BY AND VERIFIED WITH:  J Metzger PHARMD 03/28/19 1751 JDW    Culture (A)  Final    STAPHYLOCOCCUS SPECIES  (COAGULASE NEGATIVE) THE SIGNIFICANCE OF ISOLATING THIS ORGANISM FROM A SINGLE SET OF BLOOD CULTURES WHEN MULTIPLE SETS ARE DRAWN IS UNCERTAIN. PLEASE NOTIFY THE MICROBIOLOGY DEPARTMENT WITHIN ONE WEEK IF SPECIATION AND SENSITIVITIES ARE REQUIRED. Performed at [facility], [place] [address] [place], [place] [address]    Report Status 03/29/2019 FINAL  Final  Blood Culture ID Panel (Reflexed)     Status: Abnormal   Collection Time: 03/27/19  3:38 PM  Result Value Ref Range Status   Enterococcus species NOT DETECTED NOT DETECTED Final   Listeria monocytogenes NOT DETECTED NOT DETECTED Final   Staphylococcus species DETECTED (A) NOT DETECTED Final    Comment: Methicillin (oxacillin) resistant coagulase negative staphylococcus. Possible blood culture contaminant (unless isolated from more than one blood culture draw or clinical case suggests pathogenicity). No antibiotic treatment is indicated for blood  culture contaminants. CRITICAL RESULT CALLED TO, READ BACK BY AND VERIFIED WITH: J Ezel PHARMD 03/28/19 1751 JDW    Staphylococcus aureus (BCID) NOT DETECTED NOT DETECTED Final   Methicillin resistance DETECTED (A) NOT DETECTED Final    Comment: CRITICAL RESULT CALLED TO, READ BACK BY AND VERIFIED WITH: J Frio PHARMD 03/28/19 1751 JDW    Streptococcus species NOT DETECTED NOT DETECTED Final   Streptococcus agalactiae NOT DETECTED NOT DETECTED Final   Streptococcus pneumoniae NOT DETECTED NOT DETECTED Final   Streptococcus pyogenes NOT DETECTED NOT DETECTED Final   Acinetobacter baumannii NOT DETECTED NOT DETECTED Final   Enterobacteriaceae species NOT DETECTED NOT DETECTED Final   Enterobacter cloacae complex NOT DETECTED NOT DETECTED Final   Escherichia coli NOT DETECTED NOT DETECTED Final   Klebsiella oxytoca NOT DETECTED NOT DETECTED Final   Klebsiella pneumoniae NOT DETECTED NOT DETECTED Final   Proteus species NOT DETECTED NOT DETECTED Final   Serratia marcescens NOT DETECTED  NOT DETECTED Final   Haemophilus influenzae NOT DETECTED NOT DETECTED Final   Neisseria meningitidis NOT DETECTED NOT DETECTED Final   Pseudomonas aeruginosa NOT DETECTED NOT DETECTED Final   Candida albicans NOT DETECTED NOT DETECTED Final   Candida glabrata NOT DETECTED NOT DETECTED Final   Candida krusei NOT DETECTED NOT DETECTED Final   Candida parapsilosis NOT DETECTED NOT DETECTED Final   Candida tropicalis NOT DETECTED NOT DETECTED Final    Comment: Performed at [facility], [place]. [address] [place], [place] [address]  Blood Culture (routine x 2)     Status: None   Collection Time: 03/27/19  3:39 PM  Result Value Ref Range Status   Specimen Description BLOOD RIGHT ANTECUBITAL  Final   Special Requests   Final    BOTTLES DRAWN AEROBIC ONLY Blood Culture results may not be optimal due to an inadequate volume of blood received in culture bottles   Culture   Final    NO GROWTH 5 DAYS Performed at [facility], [place] [address], [place], [place] [address]    Report Status 04/01/2019 FINAL  Final  SARS Coronavirus 2 [facility] order, Performed in [facility] hospital lab)     Status: None   Collection Time: 03/27/19  3:46 PM  Result Value Ref Range Status   SARS Coronavirus 2 NEGATIVE NEGATIVE Final    Comment: (NOTE) If result is NEGATIVE SARS-CoV-2 target nucleic acids are NOT DETECTED. The SARS-CoV-2 RNA is generally detectable in upper and lower  respiratory specimens during the acute phase of infection. The lowest  concentration of SARS-CoV-2 viral copies this assay can detect is 250  copies / mL. A negative result does not preclude SARS-CoV-2 infection  and should not be used as the sole basis for treatment or other  patient management decisions.  A negative result may occur with  improper specimen collection / handling, submission of specimen other  than nasopharyngeal swab, presence of viral mutation(s) within the  areas targeted by  this assay, and inadequate  number of viral copies  (<250 copies / mL). A negative result must be combined with clinical  observations, patient history, and epidemiological information. If result is POSITIVE SARS-CoV-2 target nucleic acids are DETECTED. The SARS-CoV-2 RNA is generally detectable in upper and lower  respiratory specimens dur ing the acute phase of infection.  Positive  results are indicative of active infection with SARS-CoV-2.  Clinical  correlation with patient history and other diagnostic information is  necessary to determine patient infection status.  Positive results do  not rule out bacterial infection or co-infection with other viruses. If result is PRESUMPTIVE POSTIVE SARS-CoV-2 nucleic acids MAY BE PRESENT.   A presumptive positive result was obtained on the submitted specimen  and confirmed on repeat testing.  While 2019 novel coronavirus  (SARS-CoV-2) nucleic acids may be present in the submitted sample  additional confirmatory testing may be necessary for epidemiological  and / or clinical management purposes  to differentiate between  SARS-CoV-2 and other Sarbecovirus currently known to infect humans.  If clinically indicated additional testing with an alternate test  methodology [phone]) is advised. The SARS-CoV-2 RNA is generally  detectable in upper and lower respiratory sp ecimens during the acute  phase of infection. The expected result is Negative. Fact Sheet for Patients:  StrictlyIdeas.no Fact Sheet for Healthcare Providers: BankingDealers.co.za This test is not yet approved or cleared by the [place] FDA and has been authorized for detection and/or diagnosis of SARS-CoV-2 by FDA under an Emergency Use Authorization (EUA).  This EUA will remain in effect (meaning this test can be used) for the duration of the COVID-19 declaration under Section 564(b)(1) of the Act, 21 U.S.C. section 360bbb-3(b)(1), unless the  authorization is terminated or revoked sooner. Performed at [facility], [place] [address] [place], [place] [address]   Urine culture     Status: None   Collection Time: 03/27/19  4:18 PM  Result Value Ref Range Status   Specimen Description URINE, RANDOM  Final   Special Requests NONE  Final   Culture   Final    NO GROWTH Performed at [facility], [place] [address] [place], [place] [address]    Report Status 03/28/2019 FINAL  Final  Culture, sputum-assessment     Status: None   Collection Time: 03/28/19  3:09 AM  Result Value Ref Range Status   Specimen Description SPUTUM  Final   Special Requests NONE  Final   Sputum evaluation   Final    THIS SPECIMEN IS ACCEPTABLE FOR SPUTUM CULTURE Performed at [facility], [place] [address], [place], [place] [address]    Report Status 03/28/2019 FINAL  Final  Culture, respiratory     Status: None   Collection Time: 03/28/19  3:09 AM  Result Value Ref Range Status   Specimen Description SPUTUM  Final   Special Requests NONE Reflexed from [id number]  Final   Gram Stain   Final    RARE WBC PRESENT, PREDOMINANTLY PMN RARE SQUAMOUS EPITHELIAL CELLS PRESENT FEW GRAM POSITIVE RODS FEW GRAM NEGATIVE COCCOBACILLI    Culture   Final    MODERATE Consistent with normal respiratory flora. Performed at [facility], [place] [address] [place], [place] [address]    Report Status 03/30/2019 FINAL  Final     Studies: No results found.  Scheduled Meds: . ARIPiprazole  20 mg Oral QHS  . aspirin EC  81 mg Oral Daily  . atorvastatin  20 mg Oral QPC supper  . brimonidine  1 drop Both Eyes BID  . budesonide (PULMICORT) nebulizer solution  0.25 mg Nebulization BID  . Chlorhexidine Gluconate Cloth  6 each Topical Q0600  . donepezil  5 mg Oral QHS  . dorzolamide-timolol  1 drop Both Eyes BID  . ferrous sulfate  325 mg Oral QPC supper  . furosemide  80 mg Intravenous BID  . heparin  5,000 Units Subcutaneous Q8H  . insulin aspart  0-5  Units Subcutaneous QHS  . insulin aspart  0-9 Units Subcutaneous TID WC  . ipratropium-albuterol  3 mL Nebulization TID  . lamoTRIgine  100 mg Oral QPC supper  . latanoprost  1 drop Both Eyes QHS  . methylPREDNISolone (SOLU-MEDROL) injection  20 mg Intravenous Q24H  . mupirocin ointment   Nasal BID  . sildenafil  20 mg Oral TID    Continuous Infusions:    Time spent: 39mns, case discussed with case manager  over the phone I have personally reviewed and interpreted on  04/01/2019 daily labs, tele strips, imagings as discussed above under date review session and assessment and plans.  I reviewed all nursing notes, pharmacy notes, consultant notes,  vitals, pertinent old records  I have discussed plan of care as described above with RN , patient and niece over the phone on 04/01/2019   FFlorencia ReasonsMD, PhD  [facility] Pager [phone] If 7PM-7AM, please contact night-coverage at [email or web address], password TEn[facility]4/18/2020, 2:50 PM  LOS: 5 days

## 2019-04-02 LAB — BASIC METABOLIC PANEL
Anion gap: 13 (ref 5–15)
BUN: 66 mg/dL — ABNORMAL HIGH (ref 8–23)
CO2: 28 mmol/L (ref 22–32)
Calcium: 9.8 mg/dL (ref 8.9–10.3)
Chloride: 95 mmol/L — ABNORMAL LOW (ref 98–111)
Creatinine, Ser: 2.32 mg/dL — ABNORMAL HIGH (ref 0.44–1.00)
GFR calc Af Amer: 24 mL/min — ABNORMAL LOW (ref >60)
GFR calc non Af Amer: 21 mL/min — ABNORMAL LOW (ref >60)
Glucose, Bld: 160 mg/dL — ABNORMAL HIGH (ref 70–99)
Potassium: 3.3 mmol/L — ABNORMAL LOW (ref 3.5–5.1)
Sodium: 136 mmol/L (ref 135–145)

## 2019-04-02 LAB — GLUCOSE, CAPILLARY
Glucose-Capillary: 198 mg/dL — ABNORMAL HIGH (ref 70–99)
Glucose-Capillary: 283 mg/dL — ABNORMAL HIGH (ref 70–99)
Glucose-Capillary: 120 mg/dL — ABNORMAL HIGH (ref 70–99)
Glucose-Capillary: 142 mg/dL — ABNORMAL HIGH (ref 70–99)

## 2019-04-02 MED ORDER — POTASSIUM CHLORIDE CRYS ER 20 MEQ PO TBCR
40.0000 meq | EXTENDED_RELEASE_TABLET | Freq: Once | ORAL | Status: AC
  Administered 2019-04-02: 40 meq via ORAL
  Filled 2019-04-02: qty 2

## 2019-04-02 MED ORDER — PREDNISONE 20 MG PO TABS
30.0000 mg | ORAL_TABLET | Freq: Every day | ORAL | Status: DC
  Administered 2019-04-03: 30 mg via ORAL
  Filled 2019-04-02: qty 1

## 2019-04-02 MED ORDER — FUROSEMIDE 80 MG PO TABS
80.0000 mg | ORAL_TABLET | Freq: Every day | ORAL | Status: DC
  Administered 2019-04-03: 80 mg via ORAL
  Filled 2019-04-02: qty 1

## 2019-04-02 NOTE — Care Management (Addendum)
Spoke with DME company, [facility], regarding getting a NIV for patient.  [facility] had the patient's oxygen account with new orders as recently as 4/14.  Per Sunnie Nielsen, [facility] representative, they can provide a NIV to the patient regardless of lab results as long as there is appropriate documentation.  Ashly reviewed chart and provided paper order for MD to sign.  Dr. Algis Liming signed order and order was faxed to [facility] [phone]).    [facility] will provide a respiratory therapist to set up the ventilator and check with the patient every 3 days for the first 30 days.  [facility] will deliver the 2nd concentrator, oxymizer, and trilogy to the home tomorrow after coordinating with Olin Hauser, patient's niece.   Updated Alwyn Ren with [facility] regarding d/c plan for tomorrow.  Also spoke with Olin Hauser regarding plan for [facility] to take over oxygen supply/equipment. Brad with [facility] is aware oxygen account will need to be transferred to [facility].

## 2019-04-02 NOTE — Progress Notes (Signed)
Per [place], MD, keep O2 sat above 80%

## 2019-04-02 NOTE — Progress Notes (Signed)
PROGRESS NOTE   ALAZAE CRYMES  EGB:[id number]    DOB: 1951-12-11    DOA: 03/27/2019  PCP: Everardo Beals, NP   I have briefly reviewed patients previous medical records in [facility].  Brief Narrative:  68 year old female, lives with her daughter, ambulates with the help of Rollator, was on home hospice until 10 days ago and then switched to home health services, PMH of prior tobacco abuse, severe COPD, PAH, chronic respiratory failure on home oxygen 6 L/min prior to this admission, DM 2, HTN, HLD, GERD, renal cell carcinoma status post cryoablation, bipolar disorder, recent hospital discharge 03/09/2019 presented with low oxygen saturations (in the 30s and 40s at home) in the absence of worsening dyspnea and hence niece decided to bring her to the ED where she was not in acute distress, treated with BiPAP and remained on it for 2 days.  Currently transitioned to [facility] and awaiting delivery of trilegy by home health services hopefully 4/20 and then discharged home with home health services.   Assessment & Plan:   Active Problems:   Diabetes mellitus type 2, noninsulin dependent (HCC)   COPD with acute exacerbation (HCC)   Acute on chronic respiratory failure (HCC)   Chronic respiratory failure with hypoxia and hypercapnia (HCC)   CKD (chronic kidney disease) stage 4, GFR 15-29 ml/min (HCC)   FTT (failure to thrive) in adult   Acute on chronic diastolic CHF (congestive heart failure) (HCC)   Acute on chronic hypoxic respiratory failure:  - Suspected multifactorial due to Chronic cor pulmonale secondary to underlying severe COPD exacerbated by severe anemia, progression of underlying COPD, pulmonary hypertension, chronic diastolic CHF. -She was treated for COPD exacerbation and suspected pneumonia. -She was on 6 L/min home oxygen prior to this admission. -Initially on hospitalization placed on BiPAP and then transition to NRB mask.  Noted on 15 L/min this morning. -Diuresed with  high-dose IV Lasix 120 mg twice daily and then dose reduced to 80 mg twice daily.  However due to rising creatinine, will reduce further to prior home dose of 80 mg daily. -Tapering steroids. -Low index of suspicion for infectious etiology.  No fever, leukocytosis, normal lactate, procalcitonin 0.16, SARS coronavirus 2 testing negative, respiratory cultures consistent with normal respiratory flora, empirically started IV vancomycin and cefepime discontinued. -Discussed with weekend clinical social work who indicated that family does not wish to resume home hospice at this time.  Trilogy is supposed to be delivered home by a new agency tomorrow.  She can then return home with prior home health services. -Dr. Einar Gip, Cardiology input from 4/16 appreciated.  He discussed in detail with patient's niece, had EOL discussions, strongly recommends full comfort care, DNR, BiPAP/CPAP should not be performed, has chronic COPD and stable even with FiO2 of 60%. -As per discussion with palliative care team today, since patient is alert and interactive, family has a hard time transitioning to full comfort care.  NIV home therapy is ordered due to patient's respiratory failure secondary to COPD. NIV is being ordered to sustain life and reduce hospitalizations.  Severe pulmonary hypertension -Continue sildenafil.  Cardiology input appreciated and recommends continuing prior home medications without change.  COPD exacerbation -Improved.  Steroid taper ongoing.  Antibiotics stopped.  No clinical bronchospasm.  Chronic diastolic CHF -Clinically compensated now.  Continue prior home dose of Lasix at discharge.  Hypokalemia -Replaced  Essential hypertension -Controlled.  Holding amlodipine.  Type II DM Holding Amaryl and metformin.  Recent A1c 6.5.  Mildly uncontrolled  and fluctuating here, likely related to steroids.  Stage IV CKD Baseline creatinine appears to be between 2-2.5.  Creatinine creeping up from  1.9-2.3 over the last 3 days.  Reduce Lasix back to prior home dose of 80 mg daily.  Anemia of chronic disease Hemoglobin normal.  Bipolar disorder Stable.  Failure to thrive Multifactorial due to several advanced medical problems as discussed above.  Poor overall prognosis.  Hypokalemia Replaced.  1 of 2 blood cultures positive for coagulase-negative staph Likely contaminant.  DVT prophylaxis: Subcutaneous heparin Code Status: DNR Family Communication: None at bedside Disposition: DC home hopefully 4/20   Consultants:  Cardiology PMT  Procedures:  BiPAP  Antimicrobials:  Antibiotics discontinued   Subjective: Patient states that she feels fine.  Denies complaints.  Specifically denies dyspnea, chest pain.  States that she lives with her niece.  Ambulates with the help of a Rollator.  Has been eating well.  Had BM 4/18.  ROS: As above, otherwise negative.  Objective:  Vitals:   04/02/19 0715 04/02/19 0746 04/02/19 0858 04/02/19 1308  BP:  105/78 109/81   Pulse:  88 100   Resp:  20 20   Temp:  98.3 F (36.8 C)    TempSrc:  Axillary    SpO2: 92% (!) 88% (!) 81% 90%  Weight:      Height:        Examination:  General exam: Pleasant middle-aged female, moderately built and nourished sitting up comfortably in bed without distress. Respiratory system: Distant breath sounds.  Occasional basal crackles otherwise clear to auscultation without wheezing or rhonchi. Respiratory effort normal.  Able to speak in full sentences. Cardiovascular system: S1 & S2 heard, RRR. No JVD, murmurs, rubs, gallops or clicks. No pedal edema.  Telemetry personally reviewed: Sinus rhythm. Gastrointestinal system: Abdomen is nondistended, soft and nontender. No organomegaly or masses felt. Normal bowel sounds heard. Central nervous system: Alert and oriented. No focal neurological deficits. Extremities: Symmetric 5 x 5 power. Skin: No rashes, lesions or ulcers Psychiatry: Judgement and  insight appear somewhat impaired. Mood & affect appropriate.     Data Reviewed: I have personally reviewed following labs and imaging studies  CBC: Recent Labs  Lab 03/27/19 1531 03/28/19 0453 03/29/19 0513 03/30/19 0406 03/31/19 0322 04/01/19 0327  WBC 7.7 5.2 4.3 4.3 9.5 10.1  NEUTROABS 6.2  --   --  3.7 8.7* 8.6*  HGB 11.9* 11.3* 10.9* 11.4* 12.9 13.8  HCT 40.2 39.4 35.9* 37.0 42.1 45.5  MCV 92.4 91.0 89.8 89.2 87.5 87.3  PLT 425* 419* 441* 481* 560* 381*   Basic Metabolic Panel: Recent Labs  Lab 03/27/19 2021  03/29/19 0513 03/30/19 0406 03/31/19 0322 04/01/19 0327 04/02/19 0433  NA  --    < > 139 140 139 138 136  K  --    < > 4.2 3.9 3.6 3.9 3.3*  CL  --    < > 102 101 97* 95* 95*  CO2  --    < > 20* _0 GLUCOSE  --    < > 139* 135* 154* 119* 160*  BUN  --    < > 46* 54* 54* 58* 66*  CREATININE  --    < > 2.48* 2.30* 1.97* 2.22* 2.32*  CALCIUM  --    < > 9.1 9.4 9.8 10.0 9.8  MG 2.1  --  2.3 2.3 2.1  --   --   PHOS 3.8  --   --   --   --   --   --    < > =  values in this interval not displayed.   Liver Function Tests: Recent Labs  Lab 03/27/19 1531 03/29/19 0513  AST 20 22  ALT 12 18  ALKPHOS 64 57  BILITOT 0.6 0.9  PROT 7.0 6.8  ALBUMIN 3.9 3.7   Coagulation Profile: Recent Labs  Lab 03/27/19 1531  INR 0.9   Cardiac Enzymes: Recent Labs  Lab 03/27/19 1550  TROPONINI 0.03*   CBG: Recent Labs  Lab 04/01/19 1151 04/01/19 1705 04/01/19 2038 04/02/19 0745 04/02/19 1130  GLUCAP 133* 122* 257* 198* 283*    Recent Results (from the past 240 hour(s))  MRSA PCR Screening     Status: Abnormal   Collection Time: 03/27/19  1:09 AM  Result Value Ref Range Status   MRSA by PCR POSITIVE (A) NEGATIVE Final    Comment: CRITICAL RESULT CALLED TO, READ BACK BY AND VERIFIED WITH: RN,A. Simone Curia [id number]  THANEY Performed at [facility] [address], [place], [place] [address]   Blood Culture (routine x 2)     Status:  Abnormal   Collection Time: 03/27/19  3:38 PM  Result Value Ref Range Status   Specimen Description BLOOD RIGHT FOREARM  Final   Special Requests   Final    BOTTLES DRAWN AEROBIC AND ANAEROBIC Blood Culture adequate volume   Culture  Setup Time   Final    GRAM POSITIVE COCCI IN CLUSTERS AEROBIC BOTTLE ONLY CRITICAL RESULT CALLED TO, READ BACK BY AND VERIFIED WITH:  J [place] PHARMD 03/28/19 1751 JDW    Culture (A)  Final    STAPHYLOCOCCUS SPECIES (COAGULASE NEGATIVE) THE SIGNIFICANCE OF ISOLATING THIS ORGANISM FROM A SINGLE SET OF BLOOD CULTURES WHEN MULTIPLE SETS ARE DRAWN IS UNCERTAIN. PLEASE NOTIFY THE MICROBIOLOGY DEPARTMENT WITHIN ONE WEEK IF SPECIATION AND SENSITIVITIES ARE REQUIRED. Performed at [facility], [place] [address], [place], [place] [address]    Report Status 03/29/2019 FINAL  Final  Blood Culture ID Panel (Reflexed)     Status: Abnormal   Collection Time: 03/27/19  3:38 PM  Result Value Ref Range Status   Enterococcus species NOT DETECTED NOT DETECTED Final   Listeria monocytogenes NOT DETECTED NOT DETECTED Final   Staphylococcus species DETECTED (A) NOT DETECTED Final    Comment: Methicillin (oxacillin) resistant coagulase negative staphylococcus. Possible blood culture contaminant (unless isolated from more than one blood culture draw or clinical case suggests pathogenicity). No antibiotic treatment is indicated for blood  culture contaminants. CRITICAL RESULT CALLED TO, READ BACK BY AND VERIFIED WITH: J [place] PHARMD 03/28/19 1751 JDW    Staphylococcus aureus (BCID) NOT DETECTED NOT DETECTED Final   Methicillin resistance DETECTED (A) NOT DETECTED Final    Comment: CRITICAL RESULT CALLED TO, READ BACK BY AND VERIFIED WITH: J [place] PHARMD 03/28/19 1751 JDW    Streptococcus species NOT DETECTED NOT DETECTED Final   Streptococcus agalactiae NOT DETECTED NOT DETECTED Final   Streptococcus pneumoniae NOT DETECTED NOT DETECTED Final   Streptococcus pyogenes NOT  DETECTED NOT DETECTED Final   Acinetobacter baumannii NOT DETECTED NOT DETECTED Final   Enterobacteriaceae species NOT DETECTED NOT DETECTED Final   Enterobacter cloacae complex NOT DETECTED NOT DETECTED Final   Escherichia coli NOT DETECTED NOT DETECTED Final   Klebsiella oxytoca NOT DETECTED NOT DETECTED Final   Klebsiella pneumoniae NOT DETECTED NOT DETECTED Final   Proteus species NOT DETECTED NOT DETECTED Final   Serratia marcescens NOT DETECTED NOT DETECTED Final   Haemophilus influenzae NOT DETECTED NOT DETECTED Final  Neisseria meningitidis NOT DETECTED NOT DETECTED Final   Pseudomonas aeruginosa NOT DETECTED NOT DETECTED Final   Candida albicans NOT DETECTED NOT DETECTED Final   Candida glabrata NOT DETECTED NOT DETECTED Final   Candida krusei NOT DETECTED NOT DETECTED Final   Candida parapsilosis NOT DETECTED NOT DETECTED Final   Candida tropicalis NOT DETECTED NOT DETECTED Final    Comment: Performed at [facility], [place] [address] [place], [place] [address]  Blood Culture (routine x 2)     Status: None   Collection Time: 03/27/19  3:39 PM  Result Value Ref Range Status   Specimen Description BLOOD RIGHT ANTECUBITAL  Final   Special Requests   Final    BOTTLES DRAWN AEROBIC ONLY Blood Culture results may not be optimal due to an inadequate volume of blood received in culture bottles   Culture   Final    NO GROWTH 5 DAYS Performed at [facility], [place] [address], [place], [place] [address]    Report Status 04/01/2019 FINAL  Final  SARS Coronavirus 2 [facility] [place] order, Performed in [place] hospital lab)     Status: None   Collection Time: 03/27/19  3:46 PM  Result Value Ref Range Status   SARS Coronavirus 2 NEGATIVE NEGATIVE Final    Comment: (NOTE) If result is NEGATIVE SARS-CoV-2 target nucleic acids are NOT DETECTED. The SARS-CoV-2 RNA is generally detectable in upper and lower  respiratory specimens during the acute phase of infection. The lowest   concentration of SARS-CoV-2 viral copies this assay can detect is 250  copies / mL. A negative result does not preclude SARS-CoV-2 infection  and should not be used as the sole basis for treatment or other  patient management decisions.  A negative result may occur with  improper specimen collection / handling, submission of specimen other  than nasopharyngeal swab, presence of viral mutation(s) within the  areas targeted by this assay, and inadequate number of viral copies  (<250 copies / mL). A negative result must be combined with clinical  observations, patient history, and epidemiological information. If result is POSITIVE SARS-CoV-2 target nucleic acids are DETECTED. The SARS-CoV-2 RNA is generally detectable in upper and lower  respiratory specimens dur ing the acute phase of infection.  Positive  results are indicative of active infection with SARS-CoV-2.  Clinical  correlation with patient history and other diagnostic information is  necessary to determine patient infection status.  Positive results do  not rule out bacterial infection or co-infection with other viruses. If result is PRESUMPTIVE POSTIVE SARS-CoV-2 nucleic acids MAY BE PRESENT.   A presumptive positive result was obtained on the submitted specimen  and confirmed on repeat testing.  While 2019 novel coronavirus  (SARS-CoV-2) nucleic acids may be present in the submitted sample  additional confirmatory testing may be necessary for epidemiological  and / or clinical management purposes  to differentiate between  SARS-CoV-2 and other Sarbecovirus currently known to infect humans.  If clinically indicated additional testing with an alternate test  methodology [phone]) is advised. The SARS-CoV-2 RNA is generally  detectable in upper and lower respiratory sp ecimens during the acute  phase of infection. The expected result is Negative. Fact Sheet for Patients:  StrictlyIdeas.no Fact Sheet  for Healthcare Providers: BankingDealers.co.za This test is not yet approved or cleared by the [place] FDA and has been authorized for detection and/or diagnosis of SARS-CoV-2 by FDA under an Emergency Use Authorization (EUA).  This EUA will remain in  effect (meaning this test can be used) for the duration of the COVID-19 declaration under Section 564(b)(1) of the Act, 21 U.S.C. section 360bbb-3(b)(1), unless the authorization is terminated or revoked sooner. Performed at [facility], [place] [address] [place], [place] [address]   Urine culture     Status: None   Collection Time: 03/27/19  4:18 PM  Result Value Ref Range Status   Specimen Description URINE, RANDOM  Final   Special Requests NONE  Final   Culture   Final    NO GROWTH Performed at [facility], [place] [address], [place], [place] [address]    Report Status 03/28/2019 FINAL  Final  Culture, sputum-assessment     Status: None   Collection Time: 03/28/19  3:09 AM  Result Value Ref Range Status   Specimen Description SPUTUM  Final   Special Requests NONE  Final   Sputum evaluation   Final    THIS SPECIMEN IS ACCEPTABLE FOR SPUTUM CULTURE Performed at [facility], [place] [address] [place], [place] [address]    Report Status 03/28/2019 FINAL  Final  Culture, respiratory     Status: None   Collection Time: 03/28/19  3:09 AM  Result Value Ref Range Status   Specimen Description SPUTUM  Final   Special Requests NONE Reflexed from [id number]  Final   Gram Stain   Final    RARE WBC PRESENT, PREDOMINANTLY PMN RARE SQUAMOUS EPITHELIAL CELLS PRESENT FEW GRAM POSITIVE RODS FEW GRAM NEGATIVE COCCOBACILLI    Culture   Final    MODERATE Consistent with normal respiratory flora. Performed at [facility], [place] [address], [place], [place] [address]    Report Status 03/30/2019 FINAL  Final         Radiology Studies: No results found.      Scheduled Meds: . ARIPiprazole   20 mg Oral QHS  . aspirin EC  81 mg Oral Daily  . atorvastatin  20 mg Oral QPC supper  . brimonidine  1 drop Both Eyes BID  . budesonide (PULMICORT) nebulizer solution  0.25 mg Nebulization BID  . Chlorhexidine Gluconate Cloth  6 each Topical Q0600  . donepezil  5 mg Oral QHS  . dorzolamide-timolol  1 drop Both Eyes BID  . ferrous sulfate  325 mg Oral QPC supper  . furosemide  80 mg Intravenous BID  . guaiFENesin  600 mg Oral BID  . heparin  5,000 Units Subcutaneous Q8H  . insulin aspart  0-5 Units Subcutaneous QHS  . insulin aspart  0-9 Units Subcutaneous TID WC  . ipratropium-albuterol  3 mL Nebulization TID  . lamoTRIgine  100 mg Oral QPC supper  . latanoprost  1 drop Both Eyes QHS  . methylPREDNISolone (SOLU-MEDROL) injection  20 mg Intravenous Q24H  . mupirocin ointment   Nasal BID  . sildenafil  20 mg Oral TID   Continuous Infusions:   LOS: 6 days     Vernell Leep, MD, FACP, [facility]. [facility]  To contact the attending provider between 7A-7P or the covering provider during after hours 7P-7A, please log into the web site [email or web address] and access using universal [place] password for that web site. If you do not have the password, please call the hospital operator.  04/02/2019, 5:12 PM

## 2019-04-03 LAB — BASIC METABOLIC PANEL
Anion gap: 15 (ref 5–15)
BUN: 58 mg/dL — ABNORMAL HIGH (ref 8–23)
CO2: 25 mmol/L (ref 22–32)
Calcium: 9.7 mg/dL (ref 8.9–10.3)
Chloride: 96 mmol/L — ABNORMAL LOW (ref 98–111)
Creatinine, Ser: 2.31 mg/dL — ABNORMAL HIGH (ref 0.44–1.00)
GFR calc Af Amer: 24 mL/min — ABNORMAL LOW (ref >60)
GFR calc non Af Amer: 21 mL/min — ABNORMAL LOW (ref >60)
Glucose, Bld: 136 mg/dL — ABNORMAL HIGH (ref 70–99)
Potassium: 3.4 mmol/L — ABNORMAL LOW (ref 3.5–5.1)
Sodium: 136 mmol/L (ref 135–145)

## 2019-04-03 LAB — GLUCOSE, CAPILLARY
Glucose-Capillary: 147 mg/dL — ABNORMAL HIGH (ref 70–99)
Glucose-Capillary: 340 mg/dL — ABNORMAL HIGH (ref 70–99)
Glucose-Capillary: 308 mg/dL — ABNORMAL HIGH (ref 70–99)

## 2019-04-03 MED ORDER — POTASSIUM CHLORIDE CRYS ER 20 MEQ PO TBCR
40.0000 meq | EXTENDED_RELEASE_TABLET | Freq: Once | ORAL | Status: AC
  Administered 2019-04-03: 40 meq via ORAL
  Filled 2019-04-03: qty 2

## 2019-04-03 NOTE — Discharge Summary (Signed)
Physician Discharge Summary  Elizabeth Thompson JSE:[id number] DOB: 1951/06/22  PCP: Everardo Beals, NP  Admit date: 03/27/2019 Discharge date: 04/03/2019  Recommendations for Outpatient Follow-up:  1. Everardo Beals, NP in 1 week with repeat labs (CBC & BMP). 2. Recommend palliative care consultation and follow-up as outpatient.  Up to recently patient was on home hospice.  Home Health: Aide and Therapist, sports.  Palliative services via [place] care. Equipment/Devices: Trilogy and DME for home oxygen.  Discharge Condition: Improved and stable CODE STATUS: DNR Diet recommendation: Heart healthy and diabetic diet.  Discharge Diagnoses:  Active Problems:   Diabetes mellitus type 2, noninsulin dependent (HCC)   COPD with acute exacerbation (HCC)   Acute on chronic respiratory failure (HCC)   Chronic respiratory failure with hypoxia and hypercapnia (HCC)   CKD (chronic kidney disease) stage 4, GFR 15-29 ml/min (HCC)   FTT (failure to thrive) in adult   Acute on chronic diastolic CHF (congestive heart failure) (HCC)   Brief Summary: 68 year old female, lives with her daughter, ambulates with the help of Rollator, was on home hospice until 10 days ago and then switched to home health or palliative care services, PMH of prior tobacco abuse, severe COPD, PAH, chronic respiratory failure on home oxygen 6 L/min prior to this admission, DM 2, HTN, HLD, GERD, renal cell carcinoma status post cryoablation, bipolar disorder, recent hospital discharge 03/09/2019 presented with low oxygen saturations (in the 30s and 40s at home) in the absence of worsening dyspnea and hence niece decided to bring her to the ED where she was not in acute distress, treated with BiPAP and remained on Bipap for 2 days.     Assessment & Plan:   Acute on chronic hypoxic respiratory failure:  - Suspected multifactorial due to Chronic cor pulmonale secondary to underlying severe COPD exacerbated by severe anemia, progression of  underlying COPD, pulmonary hypertension, chronic diastolic CHF.   -She was treated for COPD exacerbation, suspected pneumonia and decompensated CHF. -She was on 6 L/min home oxygen prior to this admission. -Initially on hospitalization placed on BiPAP and then transitioned to NRB mask.  Was able to come down from 15 L/min to 6 L/min this morning and saturating in the mid 80s despite which patient is fully awake and alert, oriented and in no obvious distress. -Diuresed with high-dose IV Lasix 120 mg twice daily and then dose reduced to 80 mg twice daily.  However due to rising creatinine, reduced Lasix back to home dose of 80 mg daily.  Creatinine stable compared to yesterday. -Patient completed approximately 6 days of steroids, IV/p.o.  She was not on maintenance steroids at home.  No clinical bronchospasm noted yesterday or today.  No oral steroids at DC especially given risk of marked hyperglycemia from DM. -Low index of suspicion for infectious etiology.  No fever, leukocytosis, normal lactate, procalcitonin 0.16, SARS coronavirus 2 testing negative, respiratory cultures consistent with normal respiratory flora, empirically started IV vancomycin and cefepime discontinued. -Discussed with weekend clinical social work who indicated that family does not wish to resume home hospice at this time. -Dr. Einar Gip, Cardiology input from 4/16 appreciated.  He discussed in detail with patient's niece, had EOL discussions, strongly recommends full comfort care, DNR, BiPAP/CPAP should not be performed, has chronic COPD and stable even with FiO2 of 60%. -As per discussion with palliative care team 4/19, since patient is alert and interactive, family has a hard time transitioning to full comfort care. -Acute respiratory failure seems to have resolved.  Patient clinically  improved.  As per discussion with case management, home NIV therapy/trilogy and oxygen needs have been arranged.  NIV home therapy is ordered due to  patient's respiratory failure secondary to COPD. NIV is being ordered to sustain life and reduce hospitalizations.  Severe pulmonary hypertension -Continue sildenafil.  Cardiology input appreciated and recommends continuing prior home medications without change.  COPD exacerbation -Exacerbation resolved.  Completed antibiotics and steroids course in the hospital.  No clinical bronchospasm.  Continue prior home Symbicort, albuterol as needed nebulization and Combivent as needed inhaler.  Discontinued duplicate albuterol inhaler.  Acute on chronic diastolic CHF -Clinically compensated now.  Continue prior home dose of Lasix at discharge.  Hypokalemia -Replaced prior to discharge.  Follow BMP as outpatient.  Essential hypertension -Controlled or intermittently has soft blood pressures hence discontinued amlodipine at discharge.  Type II DM Holding Amaryl and metformin.  Recent A1c 6.5.  Mildly uncontrolled and fluctuating here, likely related to steroids.  At discharge, discontinue metformin due to creatinine clearance of 23.8 today.  Continue Amaryl.  Follow-up BMP in a week's time and if creatinine has improved then may consider resuming metformin if needed.  Stage IV CKD Baseline creatinine appears to be between 2-2.5.  Creatinine creeping up from 1.9-2.3 over the last 3 days.  Reduce Lasix back to prior home dose of 80 mg daily.  Creatinine stable in the 2.3 range compared to yesterday.  Follow BMP in a week's time as outpatient.  Since serum bicarbonate is normal, discontinued bicarbonate supplements.  Anemia of chronic disease Hemoglobin normal.  Bipolar disorder Stable.  Continue prior home medications including Abilify.  Patient also on Aricept for dementia.  Failure to thrive Multifactorial due to several advanced medical problems as discussed above.  Poor overall prognosis.  Outpatient palliative care follow-up.  1 of 2 blood cultures positive for coagulase-negative  staph Likely contaminant.    Consultants:  Cardiology PMT  Procedures:  BiPAP   Discharge Instructions  Discharge Instructions    Call MD for:  difficulty breathing, headache or visual disturbances   Complete by:  As directed    Call MD for:  extreme fatigue   Complete by:  As directed    Call MD for:  persistant dizziness or light-headedness   Complete by:  As directed    Call MD for:  persistant nausea and vomiting   Complete by:  As directed    Call MD for:  severe uncontrolled pain   Complete by:  As directed    Call MD for:  temperature >100.4   Complete by:  As directed    Diet - low sodium heart healthy   Complete by:  As directed    Diet Carb Modified   Complete by:  As directed    Increase activity slowly   Complete by:  As directed        Medication List    STOP taking these medications   amLODipine 10 MG tablet Commonly known as:  NORVASC   metFORMIN 1000 MG tablet Commonly known as:  GLUCOPHAGE   sodium bicarbonate 650 MG tablet     TAKE these medications   acetaminophen 650 MG CR tablet Commonly known as:  TYLENOL Take 1,300 mg by mouth See admin instructions. Take 2 tablets (1300 mg) by mouth every morning, make take 2 tablets (1300 mg) at night as needed for pain   albuterol (2.5 MG/3ML) 0.083% nebulizer solution Commonly known as:  PROVENTIL Take 2.5 mg by nebulization every 4 (four) hours  as needed for wheezing or shortness of breath. What changed:  Another medication with the same name was removed. Continue taking this medication, and follow the directions you see here.   ARIPiprazole 20 MG tablet Commonly known as:  ABILIFY Take 1 tablet (20 mg total) by mouth at bedtime.   ASPERCREME LIDOCAINE EX Apply 1 application topically 2 (two) times daily as needed (back pain).   aspirin 81 MG EC tablet Take 1 tablet (81 mg total) by mouth daily.   atorvastatin 20 MG tablet Commonly known as:  LIPITOR Take 20 mg by mouth daily after  supper.   BIOFREEZE EX Apply 1 application topically 2 (two) times daily as needed (back pain).   bisacodyl 10 MG suppository Commonly known as:  DULCOLAX Place 1 suppository (10 mg total) rectally daily as needed for moderate constipation.   brimonidine 0.2 % ophthalmic solution Commonly known as:  ALPHAGAN Place 1 drop into both eyes 2 (two) times daily.   budesonide-formoterol 160-4.5 MCG/ACT inhaler Commonly known as:  SYMBICORT Inhale 2 puffs into the lungs 2 (two) times daily.   Combivent Respimat 20-100 MCG/ACT Aers respimat Generic drug:  Ipratropium-Albuterol Inhale 1-2 puffs into the lungs every 4 (four) hours as needed for wheezing.   donepezil 5 MG tablet Commonly known as:  ARICEPT Take 1 tablet (5 mg total) by mouth at bedtime.   dorzolamide-timolol 22.3-6.8 MG/ML ophthalmic solution Commonly known as:  COSOPT Place 1 drop into both eyes 2 (two) times daily.   ferrous sulfate 325 (65 FE) MG tablet Take 325 mg by mouth 2 (two) times daily with a meal.   Flutter Devi Use as directed.   furosemide 80 MG tablet Commonly known as:  LASIX Take 1 tablet (80 mg total) by mouth daily.   glimepiride 4 MG tablet Commonly known as:  AMARYL Take 0.5 tablets (2 mg total) by mouth daily with breakfast.   glucose blood test strip Commonly known as:  Accu-Chek Aviva 1 each by Other route 2 (two) times daily.   lamoTRIgine 100 MG tablet Commonly known as:  LAMICTAL Take 1 tablet (100 mg total) by mouth every evening. What changed:  when to take this   latanoprost 0.005 % ophthalmic solution Commonly known as:  XALATAN Place 1 drop into both eyes at bedtime.   Magnesium 250 MG Tabs Take 250 mg by mouth See admin instructions. Take 1 tablet (250 mg) by mouth every other day after supper   Mucinex Sinus-Max 10-650-400 MG/20ML Liqd Generic drug:  Phenylephrine-APAP-guaiFENesin Take 20 mLs by mouth 2 (two) times daily.   polyethylene glycol 17 g packet Commonly  known as:  MIRALAX / GLYCOLAX Take 17 g by mouth daily. What changed:    when to take this  additional instructions   Selenium Sulf-Pyrithione-Urea 2.25 % Sham Apply 1 application topically See admin instructions. Apply topically to scalp for dermatitis once every 14 days   senna 8.6 MG tablet Commonly known as:  SENOKOT Take 1 tablet by mouth daily as needed for constipation.   sildenafil 20 MG tablet Commonly known as:  REVATIO Take 1 tablet (20 mg total) by mouth 3 (three) times daily.      Follow-up Information    Everardo Beals, NP. Schedule an appointment as soon as possible for a visit in 1 week(s).   Why:  To be seen with repeat labs (CBC & BMP). Contact information: [address] [place] [address] [phone]  Allergies  Allergen Reactions  . Codeine Nausea And Vomiting  . Nitrostat [Nitroglycerin] Other (See Comments)    Pt is on revatio  . Prednisone Other (See Comments)    Has to monitor because she is a diabetic       Procedures/Studies: Dg Chest Port 1 View  Result Date: 03/29/2019 CLINICAL DATA:  Shortness of breath. EXAM: PORTABLE CHEST 1 VIEW COMPARISON:  03/27/2019 and back to 01/03/2019 FINDINGS: Patient rotated minimally left. Midline trachea. Cardiomegaly accentuated by AP portable technique. Probable small left pleural effusion. No pneumothorax. Lower lung predominant interstitial opacities are similar. More confluent left lower lobe airspace disease is also not significantly changed. IMPRESSION: No significant change since 03/27/2019. Lower lung predominant interstitial and airspace disease, atypical infection and/or mild pulmonary venous congestion. Electronically Signed   By: Abigail Miyamoto M.D.   On: 03/29/2019 08:49   Dg Chest Port 1 View  Result Date: 03/27/2019 CLINICAL DATA:  68 year old female with hypoxia EXAM: PORTABLE CHEST 1 VIEW COMPARISON:  Prior chest x-ray and CT scan of the chest 02/25/2019 FINDINGS:  Stable borderline cardiomegaly. Mediastinal contours are unchanged. Slightly increased pulmonary vascular congestion and nonspecific bibasilar airspace opacities. No evidence of pneumothorax. Evaluation of the lung bases slightly limited by overlying soft tissues. No acute osseous abnormality. IMPRESSION: Borderline cardiomegaly, pulmonary vascular congestion and slightly increased nonspecific bibasilar airspace opacities. Findings may represent mild CHF with dependent edema versus small bilateral posteriorly layering pleural effusions and atelectasis. Bibasilar pneumonia is also a consideration in the appropriate clinical setting but is considered less likely. Electronically Signed   By: Jacqulynn Cadet M.D.   On: 03/27/2019 16:07      Subjective: Patient denies complaints.  Eating well without nausea, vomiting or abdominal pain.  Specifically denies dyspnea or chest pain.  Reports mild intermittent cough with white sputum.  Eager to return home.  Discharge Exam:  Vitals:   04/03/19 1101 04/03/19 1110 04/03/19 1125 04/03/19 1449  BP:   102/71   Pulse: 99 100 99   Resp: _0 Temp:   97.9 F (36.6 C)   TempSrc:   Axillary   SpO2: (!) 69% (!) 72% (!) 85% (!) 86%  Weight:      Height:        General exam: Pleasant middle-aged female, moderately built and nourished sitting up comfortably in bed without distress. Respiratory system: Distant breath sounds.  Occasional basal crackles otherwise clear to auscultation without wheezing or rhonchi. Respiratory effort normal.  Able to speak in full sentences. Cardiovascular system: S1 & S2 heard, RRR. No JVD, murmurs, rubs, gallops or clicks. No pedal edema.  Telemetry personally reviewed: Sinus rhythm. Gastrointestinal system: Abdomen is nondistended, soft and nontender. No organomegaly or masses felt. Normal bowel sounds heard. Central nervous system: Alert and oriented. No focal neurological deficits. Extremities: Symmetric 5 x 5  power. Skin: No rashes, lesions or ulcers Psychiatry: Judgement and insight appear somewhat impaired. Mood & affect appropriate.      The results of significant diagnostics from this hospitalization (including imaging, microbiology, ancillary and laboratory) are listed below for reference.     Microbiology: Recent Results (from the past 240 hour(s))  MRSA PCR Screening     Status: Abnormal   Collection Time: 03/27/19  1:09 AM  Result Value Ref Range Status   MRSA by PCR POSITIVE (A) NEGATIVE Final    Comment: CRITICAL RESULT CALLED TO, READ BACK BY AND VERIFIED WITH: RN,A. Simone Curia [id number] _1  THANEY Performed at [facility]  [facility], [place] [address] [place], [place] [address]   Blood Culture (routine x 2)     Status: Abnormal   Collection Time: 03/27/19  3:38 PM  Result Value Ref Range Status   Specimen Description BLOOD RIGHT FOREARM  Final   Special Requests   Final    BOTTLES DRAWN AEROBIC AND ANAEROBIC Blood Culture adequate volume   Culture  Setup Time   Final    GRAM POSITIVE COCCI IN CLUSTERS AEROBIC BOTTLE ONLY CRITICAL RESULT CALLED TO, READ BACK BY AND VERIFIED WITH:  J [place] PHARMD 03/28/19 1751 JDW    Culture (A)  Final    STAPHYLOCOCCUS SPECIES (COAGULASE NEGATIVE) THE SIGNIFICANCE OF ISOLATING THIS ORGANISM FROM A SINGLE SET OF BLOOD CULTURES WHEN MULTIPLE SETS ARE DRAWN IS UNCERTAIN. PLEASE NOTIFY THE MICROBIOLOGY DEPARTMENT WITHIN ONE WEEK IF SPECIATION AND SENSITIVITIES ARE REQUIRED. Performed at [facility], [place] [address] [place], [place] [address]    Report Status 03/29/2019 FINAL  Final  Blood Culture ID Panel (Reflexed)     Status: Abnormal   Collection Time: 03/27/19  3:38 PM  Result Value Ref Range Status   Enterococcus species NOT DETECTED NOT DETECTED Final   Listeria monocytogenes NOT DETECTED NOT DETECTED Final   Staphylococcus species DETECTED (A) NOT DETECTED Final    Comment: Methicillin (oxacillin) resistant coagulase negative  staphylococcus. Possible blood culture contaminant (unless isolated from more than one blood culture draw or clinical case suggests pathogenicity). No antibiotic treatment is indicated for blood  culture contaminants. CRITICAL RESULT CALLED TO, READ BACK BY AND VERIFIED WITH: J [place] PHARMD 03/28/19 1751 JDW    Staphylococcus aureus (BCID) NOT DETECTED NOT DETECTED Final   Methicillin resistance DETECTED (A) NOT DETECTED Final    Comment: CRITICAL RESULT CALLED TO, READ BACK BY AND VERIFIED WITH: J [place] PHARMD 03/28/19 1751 JDW    Streptococcus species NOT DETECTED NOT DETECTED Final   Streptococcus agalactiae NOT DETECTED NOT DETECTED Final   Streptococcus pneumoniae NOT DETECTED NOT DETECTED Final   Streptococcus pyogenes NOT DETECTED NOT DETECTED Final   Acinetobacter baumannii NOT DETECTED NOT DETECTED Final   Enterobacteriaceae species NOT DETECTED NOT DETECTED Final   Enterobacter cloacae complex NOT DETECTED NOT DETECTED Final   Escherichia coli NOT DETECTED NOT DETECTED Final   Klebsiella oxytoca NOT DETECTED NOT DETECTED Final   Klebsiella pneumoniae NOT DETECTED NOT DETECTED Final   Proteus species NOT DETECTED NOT DETECTED Final   Serratia marcescens NOT DETECTED NOT DETECTED Final   Haemophilus influenzae NOT DETECTED NOT DETECTED Final   Neisseria meningitidis NOT DETECTED NOT DETECTED Final   Pseudomonas aeruginosa NOT DETECTED NOT DETECTED Final   Candida albicans NOT DETECTED NOT DETECTED Final   Candida glabrata NOT DETECTED NOT DETECTED Final   Candida krusei NOT DETECTED NOT DETECTED Final   Candida parapsilosis NOT DETECTED NOT DETECTED Final   Candida tropicalis NOT DETECTED NOT DETECTED Final    Comment: Performed at [facility], [place]. [address] [place], [place] [address]  Blood Culture (routine x 2)     Status: None   Collection Time: 03/27/19  3:39 PM  Result Value Ref Range Status   Specimen Description BLOOD RIGHT ANTECUBITAL  Final   Special  Requests   Final    BOTTLES DRAWN AEROBIC ONLY Blood Culture results may not be optimal due to an inadequate volume of blood received in culture bottles   Culture   Final    NO GROWTH 5 DAYS  Performed at [facility], [place] [address], [place], [place] [address]    Report Status 04/01/2019 FINAL  Final  SARS Coronavirus 2 [facility] order, Performed in [place] hospital lab)     Status: None   Collection Time: 03/27/19  3:46 PM  Result Value Ref Range Status   SARS Coronavirus 2 NEGATIVE NEGATIVE Final    Comment: (NOTE) If result is NEGATIVE SARS-CoV-2 target nucleic acids are NOT DETECTED. The SARS-CoV-2 RNA is generally detectable in upper and lower  respiratory specimens during the acute phase of infection. The lowest  concentration of SARS-CoV-2 viral copies this assay can detect is 250  copies / mL. A negative result does not preclude SARS-CoV-2 infection  and should not be used as the sole basis for treatment or other  patient management decisions.  A negative result may occur with  improper specimen collection / handling, submission of specimen other  than nasopharyngeal swab, presence of viral mutation(s) within the  areas targeted by this assay, and inadequate number of viral copies  (<250 copies / mL). A negative result must be combined with clinical  observations, patient history, and epidemiological information. If result is POSITIVE SARS-CoV-2 target nucleic acids are DETECTED. The SARS-CoV-2 RNA is generally detectable in upper and lower  respiratory specimens dur ing the acute phase of infection.  Positive  results are indicative of active infection with SARS-CoV-2.  Clinical  correlation with patient history and other diagnostic information is  necessary to determine patient infection status.  Positive results do  not rule out bacterial infection or co-infection with other viruses. If result is PRESUMPTIVE POSTIVE SARS-CoV-2 nucleic acids MAY BE PRESENT.   A  presumptive positive result was obtained on the submitted specimen  and confirmed on repeat testing.  While 2019 novel coronavirus  (SARS-CoV-2) nucleic acids may be present in the submitted sample  additional confirmatory testing may be necessary for epidemiological  and / or clinical management purposes  to differentiate between  SARS-CoV-2 and other Sarbecovirus currently known to infect humans.  If clinically indicated additional testing with an alternate test  methodology [phone]) is advised. The SARS-CoV-2 RNA is generally  detectable in upper and lower respiratory sp ecimens during the acute  phase of infection. The expected result is Negative. Fact Sheet for Patients:  StrictlyIdeas.no Fact Sheet for Healthcare Providers: BankingDealers.co.za This test is not yet approved or cleared by the [place] FDA and has been authorized for detection and/or diagnosis of SARS-CoV-2 by FDA under an Emergency Use Authorization (EUA).  This EUA will remain in effect (meaning this test can be used) for the duration of the COVID-19 declaration under Section 564(b)(1) of the Act, 21 U.S.C. section 360bbb-3(b)(1), unless the authorization is terminated or revoked sooner. Performed at [facility], [place] [address], [place], [place] [address]   Urine culture     Status: None   Collection Time: 03/27/19  4:18 PM  Result Value Ref Range Status   Specimen Description URINE, RANDOM  Final   Special Requests NONE  Final   Culture   Final    NO GROWTH Performed at [facility], [place] [address] [place], [place] [address]    Report Status 03/28/2019 FINAL  Final  Culture, sputum-assessment     Status: None   Collection Time: 03/28/19  3:09 AM  Result Value Ref Range Status   Specimen Description SPUTUM  Final   Special Requests NONE  Final   Sputum evaluation  Final    THIS SPECIMEN IS ACCEPTABLE FOR SPUTUM CULTURE Performed at  [facility], [place] [address] [place], [place] [address]    Report Status 03/28/2019 FINAL  Final  Culture, respiratory     Status: None   Collection Time: 03/28/19  3:09 AM  Result Value Ref Range Status   Specimen Description SPUTUM  Final   Special Requests NONE Reflexed from [id number]  Final   Gram Stain   Final    RARE WBC PRESENT, PREDOMINANTLY PMN RARE SQUAMOUS EPITHELIAL CELLS PRESENT FEW GRAM POSITIVE RODS FEW GRAM NEGATIVE COCCOBACILLI    Culture   Final    MODERATE Consistent with normal respiratory flora. Performed at [facility], [place] [address] [place], [place] [address]    Report Status 03/30/2019 FINAL  Final     Labs: CBC: Recent Labs  Lab 03/27/19 1531 03/28/19 0453 03/29/19 0513 03/30/19 0406 03/31/19 0322 04/01/19 0327  WBC 7.7 5.2 4.3 4.3 9.5 10.1  NEUTROABS 6.2  --   --  3.7 8.7* 8.6*  HGB 11.9* 11.3* 10.9* 11.4* 12.9 13.8  HCT 40.2 39.4 35.9* 37.0 42.1 45.5  MCV 92.4 91.0 89.8 89.2 87.5 87.3  PLT 425* 419* 441* 481* 560* 500*   Basic Metabolic Panel: Recent Labs  Lab 03/27/19 2021  03/29/19 0513 03/30/19 0406 03/31/19 0322 04/01/19 0327 04/02/19 0433 04/03/19 0529  NA  --    < > 139 140 139 138 136 136  K  --    < > 4.2 3.9 3.6 3.9 3.3* 3.4*  CL  --    < > 102 101 97* 95* 95* 96*  CO2  --    < > 20* _0 GLUCOSE  --    < > 139* 135* 154* 119* 160* 136*  BUN  --    < > 46* 54* 54* 58* 66* 58*  CREATININE  --    < > 2.48* 2.30* 1.97* 2.22* 2.32* 2.31*  CALCIUM  --    < > 9.1 9.4 9.8 10.0 9.8 9.7  MG 2.1  --  2.3 2.3 2.1  --   --   --   PHOS 3.8  --   --   --   --   --   --   --    < > = values in this interval not displayed.   Liver Function Tests: Recent Labs  Lab 03/27/19 1531 03/29/19 0513  AST 20 22  ALT 12 18  ALKPHOS 64 57  BILITOT 0.6 0.9  PROT 7.0 6.8  ALBUMIN 3.9 3.7   BNP (last 3 results) Recent Labs    02/24/19 1150 02/26/19 0632 03/27/19 1531  BNP 371.7* 547.8* 593.2*   Cardiac  Enzymes: Recent Labs  Lab 03/27/19 1550  TROPONINI 0.03*   CBG: Recent Labs  Lab 04/02/19 1130 04/02/19 1754 04/02/19 2112 04/03/19 0746 04/03/19 1124  GLUCAP 283* 120* 142* 147* 340*   Urinalysis    Component Value Date/Time   COLORURINE YELLOW 03/27/2019 1618   APPEARANCEUR CLEAR 03/27/2019 1618   LABSPEC 1.013 03/27/2019 1618   PHURINE 5.0 03/27/2019 1618   GLUCOSEU NEGATIVE 03/27/2019 1618   HGBUR NEGATIVE 03/27/2019 1618   BILIRUBINUR NEGATIVE 03/27/2019 1618   KETONESUR NEGATIVE 03/27/2019 1618   PROTEINUR 30 (A) 03/27/2019 1618   UROBILINOGEN 0.2 08/17/2015 0109   NITRITE NEGATIVE 03/27/2019 1618   LEUKOCYTESUR NEGATIVE 03/27/2019 1618    I discussed in detail with patient's niece,  updated care and answered questions.  Time coordinating discharge: 40 minutes  SIGNED:  Vernell Leep, MD, FACP, [facility]. [facility]  To contact the attending provider between 7A-7P or the covering provider during after hours 7P-7A, please log into the web site [email or web address] and access using universal [place] password for that web site. If you do not have the password, please call the hospital operator.

## 2019-04-03 NOTE — Discharge Instructions (Signed)
Please get your medications reviewed and adjusted by your Primary MD.  Please request your Primary MD to go over all Hospital Tests and Procedure/Radiological results at the follow up, please get all Hospital records sent to your Prim MD by signing hospital release before you go home.  If you had Pneumonia of Lung problems at the Hospital: Please get a 2 view Chest X ray done in 6-8 weeks after hospital discharge or sooner if instructed by your Primary MD.  If you have Congestive Heart Failure: Please call your Cardiologist or Primary MD anytime you have any of the following symptoms:  1) 3 pound weight gain in 24 hours or 5 pounds in 1 week  2) shortness of breath, with or without a dry hacking cough  3) swelling in the hands, feet or stomach  4) if you have to sleep on extra pillows at night in order to breathe  Follow cardiac low salt diet and 1.5 lit/day fluid restriction.  If you have diabetes Accuchecks 4 times/day, Once in AM empty stomach and then before each meal. Log in all results and show them to your primary doctor at your next visit. If any glucose reading is under 80 or above 300 call your primary MD immediately.  If you have Seizure/Convulsions/Epilepsy: Please do not drive, operate heavy machinery, participate in activities at heights or participate in high speed sports until you have seen by Primary MD or a Neurologist and advised to do so again.  If you had Gastrointestinal Bleeding: Please ask your Primary MD to check a complete blood count within one week of discharge or at your next visit. Your endoscopic/colonoscopic biopsies that are pending at the time of discharge, will also need to followed by your Primary MD.  Get Medicines reviewed and adjusted. Please take all your medications with you for your next visit with your Primary MD  Please request your Primary MD to go over all hospital tests and procedure/radiological results at the follow up, please ask your  Primary MD to get all Hospital records sent to his/her office.  If you experience worsening of your admission symptoms, develop shortness of breath, life threatening emergency, suicidal or homicidal thoughts you must seek medical attention immediately by calling 911 or calling your MD immediately  if symptoms less severe.  You must read complete instructions/literature along with all the possible adverse reactions/side effects for all the Medicines you take and that have been prescribed to you. Take any new Medicines after you have completely understood and accpet all the possible adverse reactions/side effects.   Do not drive or operate heavy machinery when taking Pain medications.   Do not take more than prescribed Pain, Sleep and Anxiety Medications  Special Instructions: If you have smoked or chewed Tobacco  in the last 2 yrs please stop smoking, stop any regular Alcohol  and or any Recreational drug use.  Wear Seat belts while driving.  Please note You were cared for by a hospitalist during your hospital stay. If you have any questions about your discharge medications or the care you received while you were in the hospital after you are discharged, you can call the unit and asked to speak with the hospitalist on call if the hospitalist that took care of you is not available. Once you are discharged, your primary care physician will handle any further medical issues. Please note that NO REFILLS for any discharge medications will be authorized once you are discharged, as it is imperative that you  return to your primary care physician (or establish a relationship with a primary care physician if you do not have one) for your aftercare needs so that they can reassess your need for medications and monitor your lab values.  You can reach the hospitalist office at phone [phone] or fax [phone]   If you do not have a primary care physician, you can call [phone] for a physician  referral.

## 2019-04-03 NOTE — TOC Transition Note (Addendum)
Transition of Care [facility]) - CM/SW Discharge Note   Patient Details  Name: Elizabeth Thompson MRN: [id number] Date of Birth: [birth year]  Transition of Care [facility]) CM/SW Contact:  Bethena Roys, RN Phone Number: 04/03/2019, 12:26 PM   Clinical Narrative: Unable to get trilogy set up at home over the weekend. Plan to transition home today. [facility] will supply the Trilogy, 2 cannulated oxygen concentrators, oxymizer, oxy mask and nebulizer mask as well. CM did clarify that patient wants Palliative Services only and will reach out to Hospice if she needs in the future. CM to reach out to niece Elizabeth Thompson to call CM once al DME has been delivered so CM can call for PTAR for transport. No further needs from CM at this time.   1525 04-03-19 PTAR set for 1600 pick up. No further needs from CM at this time.   Final next level of care: [place] via [facility]) Barriers to Discharge: Continued Medical Work up(Making sure she has adequate oxygen in the home)   Patient Goals and CMS Choice Patient states their goals for this hospitalization and ongoing recovery are:: "to get back home with family"      Discharge Placement                       Discharge Plan and Services In-house Referral: Hospice / Palliative Care(CM asked for Palliative Consult 03-28-19) Discharge Planning Services: CM Consult Post Acute Care Choice: Home Health          DME Arranged: (S) (Trilogy, Oxymizer) DME Agency: [facility] HH Arranged: RN, Disease Management, Nurse's Aide [place] Agency: [facility] (formerly [employer])   Social Determinants of Health (SDOH) Interventions     Readmission Risk Interventions No flowsheet data found.

## 2019-04-03 NOTE — Progress Notes (Signed)
[facility]  Order received for community based palliative care once discharged.    [facility] will follow in the community.  Thank you, Venia Carbon RN, BSN, [facility] Liaison [phone]

## 2019-04-03 NOTE — Evaluation (Signed)
Physical Therapy Evaluation Patient Details Name: Elizabeth Thompson MRN: [id number] DOB: [birth year] Today's Date: 04/03/2019   History of Present Illness   Elizabeth Thompson is a 68 y.o. female with medical history significant of bipolar disorder, pulmonary hypertension with COPD on 6 L nasal cannula at baseline, diabetes type 2 who presented with worsening shortness of breath and a nonproductive cough.  She had gone home with hospice but recently aswitched back to home health  Clinical Impression  Patient reports fatigue with mobility but she reports she did not ambulate very far at baseline. She required supervision for transfers. She would benefit from home health PT to improve endurance. Skilled therapy will cotninue to follow her but she will [place] discharge today.     Follow Up Recommendations Home health PT;No PT follow up    Equipment Recommendations  None recommended by PT    Recommendations for Other Services       Precautions / Restrictions Precautions Precautions: Fall Precaution Comments: watch SpO2 Restrictions Weight Bearing Restrictions: No      Mobility  Bed Mobility Overal bed mobility: Modified Independent             General bed mobility comments: Patient able to get to the edge of the bed without assit   Transfers Overall transfer level: Needs assistance Equipment used: Rolling walker (2 wheeled)   Sit to Stand: Supervision         General transfer comment: supervision for inital blanace. Patient declined to get into a chair because she will be discharging soon   Ambulation/Gait Ambulation/Gait assistance: Supervision Gait Distance (Feet): 12 Feet Assistive device: Rolling walker (2 wheeled) Gait Pattern/deviations: Step-to pattern;Decreased stride length Gait velocity: decreased   General Gait Details: Patient became mildly short of breath   Stairs            Wheelchair Mobility    Modified Rankin (Stroke Patients Only)        Balance Overall balance assessment: Needs assistance Sitting-balance support: No upper extremity supported;Feet supported Sitting balance-Leahy Scale: Good     Standing balance support: Bilateral upper extremity supported Standing balance-Leahy Scale: Poor                               Pertinent Vitals/Pain Pain Assessment: No/denies pain    Home Living Family/patient expects to be discharged to:: Private residence Living Arrangements: Other relatives Available Help at Discharge: Family;Available 24 hours/day Type of Home: House Home Access: Stairs to enter Entrance Stairs-Rails: None Entrance Stairs-Number of Steps: 2 on the side, and 4 in the front Home Layout: One level Home Equipment: Clinical cytogeneticist - 4 wheels;Wheelchair - manual;Bedside commode;Grab bars - tub/shower      Prior Function Level of Independence: Needs assistance   Gait / Transfers Assistance Needed: Uses the rollator for support  ADL's / Homemaking Assistance Needed: Patient states neice helps her         Hand Dominance        Extremity/Trunk Assessment   Upper Extremity Assessment Upper Extremity Assessment: Overall WFL for tasks assessed    Lower Extremity Assessment Lower Extremity Assessment: Overall WFL for tasks assessed    Cervical / Trunk Assessment Cervical / Trunk Assessment: Normal  Communication   Communication: No difficulties  Cognition Arousal/Alertness: Awake/alert Behavior During Therapy: WFL for tasks assessed/performed Overall Cognitive Status: Within Functional Limits for tasks assessed  General Comments: flat affect, pleasant,       General Comments      Exercises     Assessment/Plan    PT Assessment Patient needs continued PT services  PT Problem List Decreased strength;Decreased activity tolerance;Decreased balance;Decreased mobility;Cardiopulmonary status limiting activity       PT  Treatment Interventions DME instruction;Gait training;Functional mobility training;Therapeutic activities;Stair training;Balance training;Patient/family education;Therapeutic exercise    PT Goals (Current goals can be found in the Care Plan section)  Acute Rehab PT Goals Patient Stated Goal: to get to her chair PT Goal Formulation: With patient Time For Goal Achievement: 03/11/19    Frequency Min 3X/week   Barriers to discharge        Co-evaluation               AM-PAC PT "6 Clicks" Mobility  Outcome Measure Help needed turning from your back to your side while in a flat bed without using bedrails?: None Help needed moving from lying on your back to sitting on the side of a flat bed without using bedrails?: None Help needed moving to and from a bed to a chair (including a wheelchair)?: A Little Help needed standing up from a chair using your arms (e.g., wheelchair or bedside chair)?: A Little Help needed to walk in hospital room?: A Little Help needed climbing 3-5 steps with a railing? : A Little 6 Click Score: 20    End of Session Equipment Utilized During Treatment: Oxygen Activity Tolerance: Patient tolerated treatment well Patient left: with call bell/phone within reach;with chair alarm set;with nursing/sitter in room(sitting edge of the bed with nursing ) Nurse Communication: Mobility status(Patient discharging but nursing reported PT eval required ) PT Visit Diagnosis: Other abnormalities of gait and mobility (R26.89);Difficulty in walking, not elsewhere classified (R26.2)    Time: 1534-1550 PT Time Calculation (min) (ACUTE ONLY): 16 min   Charges:   PT Evaluation $PT Eval Moderate Complexity: 1 Mod          Carney Living PT DPT  04/03/2019, 4:15 PM

## 2019-04-03 NOTE — Care Management Important Message (Signed)
Important Message  Patient Details  Name: Elizabeth Thompson MRN: [id number] Date of Birth: 1951-08-19   Medicare Important Message Given:  Yes    Orbie Pyo 04/03/2019, 3:41 PM

## 2019-04-06 ENCOUNTER — Other Ambulatory Visit: Payer: Medicaid Other | Admitting: Adult Health Nurse Practitioner

## 2019-04-06 ENCOUNTER — Other Ambulatory Visit: Payer: Self-pay

## 2019-04-06 DIAGNOSIS — Z515 Encounter for palliative care: Secondary | ICD-10-CM

## 2019-04-06 NOTE — Progress Notes (Signed)
[facility] Palliative Care Consult Note Telephone: [phone]  Fax: [phone]  PATIENT NAME: Elizabeth Thompson DOB: 1951/06/22 MRN: [id number]  PRIMARY CARE PROVIDER:   Everardo Beals, NP  REFERRING PROVIDER:  Everardo Beals, NP [place], [place] [address]  RESPONSIBLE PARTY:   Jinny Sanders, niece and caregiver [phone]  Due to the COVID-19 crisis, this visit was done via telemedicine and it was initiated and consent by this patient and or family. Video-audio (telehealth) contact was unable to be done due to technical barriers from the patient's side.     RECOMMENDATIONS and PLAN:  1.  COPD.  Patient had recent hospitalization on 03/27/2019 to 04/03/2019 for COPD exacerbation related to pneumonia, which was treated with antibiotics and prednisone.  Patient states that she still has a productive cough but feels like it is improving.  Continues to take mucinex for this.  Patient did require BiPAP in the hospital and was discharged with trilogy.  [name] her niece and caregiver states that she does use the trilogy at night for 4-6 hours and sometimes during the day when she takes a nap.  States that her baseline oxygen levels are upper 70s to low 90s.  She is currently on 7L of O2 via nasal cannula.  [name] states that when her oxygen levels drop she is able to get them to go back up with the trilogy, position changes, and incentive spirometry.    2.  Mobility.  Patient is able to ambulate short distances with rollator.  Mostly lies in bed or sits.  Is able to get up use bedside commode with assistance.  She is weaker after being in the hospital.  Home health is to start PT/OT next week.  [name] has found a Sit and Be Fit program on [employer] that she has recorded for the patient to help her move more.    3.  Nutrition.  Patient was not eating much while she was sick but her appetite has improved and she is eating well.  Have not noticed any significant  weight changes.  4. Comorbidities. Anemia is under control with hemoglobin and hematocrit WNL. Patient takes iron supplement. CHF--patient discharged from hospital on 80 mg lasix daily.  In hospital she was on 80 mg Lasix BID.  From discharge summary notes this was changed from BID to daily because her creatinine was going up.  DMT2--patient was on metformin and this was discontinues upon discharge from hospital because of her kidney function.  Prior to hospitalization she was on amaryl 2 mg daily and she is still on this.  [name] states that her sugars are starting to go up into the 200s and she did give patient half a dose of the metformin as she did not have any other orders for insulin or anything else to give her.  Patient has telemedicine appointment set up today with PCP to address the dose of lasix and to see what else she can get to keep her blood sugars from going to high.  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  5. Goals of care.  Patient is a DNR.  Patient was under hospice services until recently.  Patient and caregiver not ready for full comfort measures.  [name] wants to do what they can to keep her mobile and able to better use the bedside commode.  Does understand that patient has a terminal illness and does not want extreme  measures to be done but wants to be able to improve the things that can be improved like her mobility.  Patient does seem to be improving since coming home from hospital.  Patient and [name] to clarify lasix dosage and frequency and diabetic medication at PCP visit today.  Will have telephone visit again on 04/21/2019 at 10am  I spent 60 minutes providing this consultation,  from 10:00 to 11:00. More than 50% of the time in this consultation was spent coordinating communication.   HISTORY OF PRESENT ILLNESS:  Elizabeth Thompson is a 68 y.o. year old female with multiple medical problems including COPD, CHF, CKD stage IV, bipolar. Palliative Care  was asked to help address goals of care.   CODE STATUS: DNR  PPS: 50% HOSPICE ELIGIBILITY/DIAGNOSIS: TBD  PAST MEDICAL HISTORY:  Past Medical History:  Diagnosis Date  . Acute respiratory distress 03/28/2019  . Acute respiratory failure ([place]) 06/09/2017  . Arthritis   . Back pain   . Bell's palsy   . Bipolar disorder ([place])   . Bronchitis   . Chronic low back pain 05/09/2015  . Cough with expectoration 05/21/16   recent diagnosis of bronchitis  . Cyst of right kidney   . Diabetes mellitus without complication ([place])    type 2  . Frequency of urination   . GERD (gastroesophageal reflux disease)   . Glaucoma   . History of blood transfusion   . History of colon polyps   . History of hiatal hernia   . History of tobacco use   . Hypercalcemia   . Hyperlipidemia   . Hypertension   . Memory disorder 09/05/2014  . On home oxygen therapy    3L/M [place] at night   . Schizo-affective psychosis ([place])   . Shortness of breath dyspnea    exertion, or with out oxygen  . Uterine fibroid     SOCIAL HX:  Social History   Tobacco Use  . Smoking status: Former Smoker    Packs/day: 1.00    Years: 28.00    Pack years: 28.00    Types: Cigarettes    Last attempt to quit: 07/15/2015    Years since quitting: 3.7  . Smokeless tobacco: Never Used  Substance Use Topics  . Alcohol use: No    Alcohol/week: 0.0 standard drinks    ALLERGIES:  Allergies  Allergen Reactions  . Codeine Nausea And Vomiting  . Nitrostat [Nitroglycerin] Other (See Comments)    Pt is on revatio  . Prednisone Other (See Comments)    Has to monitor because she is a diabetic      PERTINENT MEDICATIONS:  Outpatient Encounter Medications as of 04/06/2019  Medication Sig  . acetaminophen (TYLENOL) 650 MG CR tablet Take 1,300 mg by mouth See admin instructions. Take 2 tablets (1300 mg) by mouth every morning, make take 2 tablets (1300 mg) at night as needed for pain  . albuterol (PROVENTIL) (2.5 MG/3ML) 0.083% nebulizer  solution Take 2.5 mg by nebulization every 4 (four) hours as needed for wheezing or shortness of breath.   . ARIPiprazole (ABILIFY) 20 MG tablet Take 1 tablet (20 mg total) by mouth at bedtime.  . ASPERCREME LIDOCAINE EX Apply 1 application topically 2 (two) times daily as needed (back pain).  [name] aspirin EC 81 MG EC tablet Take 1 tablet (81 mg total) by mouth daily.  [name] atorvastatin (LIPITOR) 20 MG tablet Take 20 mg by mouth daily after supper.  . bisacodyl (DULCOLAX) 10 MG suppository Place  1 suppository (10 mg total) rectally daily as needed for moderate constipation.  . brimonidine (ALPHAGAN) 0.2 % ophthalmic solution Place 1 drop into both eyes 2 (two) times daily.  . budesonide-formoterol (SYMBICORT) 160-4.5 MCG/ACT inhaler Inhale 2 puffs into the lungs 2 (two) times daily.  [name] donepezil (ARICEPT) 5 MG tablet Take 1 tablet (5 mg total) by mouth at bedtime.  . dorzolamide-timolol (COSOPT) 22.3-6.8 MG/ML ophthalmic solution Place 1 drop into both eyes 2 (two) times daily.  . ferrous sulfate 325 (65 FE) MG tablet Take 325 mg by mouth 2 (two) times daily with a meal.   . furosemide (LASIX) 80 MG tablet Take 1 tablet (80 mg total) by mouth daily.  [name] glimepiride (AMARYL) 4 MG tablet Take 0.5 tablets (2 mg total) by mouth daily with breakfast.  . glucose blood (ACCU-CHEK AVIVA) test strip 1 each by Other route 2 (two) times daily.  . Ipratropium-Albuterol (COMBIVENT RESPIMAT) 20-100 MCG/ACT AERS respimat Inhale 1-2 puffs into the lungs every 4 (four) hours as needed for wheezing.   . lamoTRIgine (LAMICTAL) 100 MG tablet Take 1 tablet (100 mg total) by mouth every evening. (Patient taking differently: Take 100 mg by mouth daily after supper. )  . latanoprost (XALATAN) 0.005 % ophthalmic solution Place 1 drop into both eyes at bedtime.  . Magnesium 250 MG TABS Take 250 mg by mouth See admin instructions. Take 1 tablet (250 mg) by mouth every other day after supper  . Menthol, Topical Analgesic, (BIOFREEZE  EX) Apply 1 application topically 2 (two) times daily as needed (back pain).  . Phenylephrine-APAP-Guaifenesin (MUCINEX SINUS-MAX) 10-650-400 MG/20ML LIQD Take 20 mLs by mouth 2 (two) times daily.  . polyethylene glycol (MIRALAX / GLYCOLAX) packet Take 17 g by mouth daily. (Patient taking differently: Take 17 g by mouth every other day. Mix in 4-8 oz liquid and drink)  . Respiratory Therapy Supplies (FLUTTER) DEVI Use as directed.  . Selenium Sulf-Pyrithione-Urea 2.25 % SHAM Apply 1 application topically See admin instructions. Apply topically to scalp for dermatitis once every 14 days  . senna (SENOKOT) 8.6 MG tablet Take 1 tablet by mouth daily as needed for constipation.   . sildenafil (REVATIO) 20 MG tablet Take 1 tablet (20 mg total) by mouth 3 (three) times daily.   No facility-administered encounter medications on file as of 04/06/2019.      Aliveah Gallant Jenetta Downer, NP

## 2019-06-22 ENCOUNTER — Other Ambulatory Visit: Payer: Self-pay | Admitting: *Deleted

## 2019-06-22 ENCOUNTER — Other Ambulatory Visit: Payer: Self-pay | Admitting: Interventional Radiology

## 2019-06-22 DIAGNOSIS — C642 Malignant neoplasm of left kidney, except renal pelvis: Secondary | ICD-10-CM

## 2019-06-23 ENCOUNTER — Other Ambulatory Visit: Payer: Self-pay | Admitting: Interventional Radiology

## 2019-06-23 ENCOUNTER — Ambulatory Visit (HOSPITAL_COMMUNITY)
Admission: RE | Admit: 2019-06-23 | Discharge: 2019-06-23 | Disposition: A | Discharge status: Home / Self Care | Payer: Medicare Other | Source: Ambulatory Visit | Attending: Interventional Radiology | Admitting: Interventional Radiology

## 2019-06-23 ENCOUNTER — Other Ambulatory Visit: Payer: Self-pay

## 2019-06-23 DIAGNOSIS — C642 Malignant neoplasm of left kidney, except renal pelvis: Secondary | ICD-10-CM

## 2019-06-23 LAB — POCT I-STAT CREATININE: Creatinine, Ser: 3.5 mg/dL — ABNORMAL HIGH (ref 0.44–1.00)

## 2019-06-23 MED ORDER — IOHEXOL 300 MG/ML  SOLN: 100.0000 mL | Freq: Once | INTRAMUSCULAR | Status: DC | PRN

## 2019-06-23 MED ORDER — SODIUM CHLORIDE (PF) 0.9 % IJ SOLN
INTRAMUSCULAR | Status: AC
  Filled 2019-06-23: qty 50

## 2019-07-13 ENCOUNTER — Ambulatory Visit
Admission: RE | Admit: 2019-07-13 | Discharge: 2019-07-13 | Disposition: A | Discharge status: Home / Self Care | Payer: Medicare Other | Source: Ambulatory Visit | Attending: Interventional Radiology | Admitting: Interventional Radiology

## 2019-07-13 ENCOUNTER — Other Ambulatory Visit: Payer: Self-pay

## 2019-07-13 ENCOUNTER — Encounter: Payer: Self-pay | Admitting: *Deleted

## 2019-07-13 DIAGNOSIS — C642 Malignant neoplasm of left kidney, except renal pelvis: Secondary | ICD-10-CM

## 2019-07-13 HISTORY — PX: IR RADIOLOGIST EVAL & MGMT: IMG5224

## 2019-07-14 ENCOUNTER — Telehealth: Payer: Self-pay

## 2019-07-14 NOTE — Telephone Encounter (Signed)
Mechele Claude a nurse from [place] called and asked if you would do a referral for this patient for the heart failure clinic, she has gained 20 pounds and has leg edema.   Mechele Claude @ [facility] [phone]

## 2019-07-15 NOTE — Telephone Encounter (Signed)
She cannot be referred, she can come see [place]. She has end stage CHF and nothing more can be done.

## 2019-07-17 ENCOUNTER — Telehealth: Payer: Self-pay

## 2019-07-17 NOTE — Telephone Encounter (Incomplete)
Telephone encounter:  Reason for call:  fluid retention in pt lower back, buttock, hips, pt has gained  20 -30 over past month, bp is normal , no chest pain , caretaker Olin Hauser called  Usual provider: ganji  Last office visit: n/a  Next office visit: 07/28/19   Last hospitalization: 03/27/19  *** Current Outpatient Medications on File Prior to Visit  Medication Sig Dispense Refill  . acetaminophen (TYLENOL) 650 MG CR tablet Take 1,300 mg by mouth See admin instructions. Take 2 tablets (1300 mg) by mouth every morning, make take 2 tablets (1300 mg) at night as needed for pain    . albuterol (PROVENTIL) (2.5 MG/3ML) 0.083% nebulizer solution Take 2.5 mg by nebulization every 4 (four) hours as needed for wheezing or shortness of breath.     . ARIPiprazole (ABILIFY) 20 MG tablet Take 1 tablet (20 mg total) by mouth at bedtime. 30 tablet 0  . ASPERCREME LIDOCAINE EX Apply 1 application topically 2 (two) times daily as needed (back pain).    [name] aspirin EC 81 MG EC tablet Take 1 tablet (81 mg total) by mouth daily. 30 tablet 0  . atorvastatin (LIPITOR) 20 MG tablet Take 20 mg by mouth daily after supper.    . bisacodyl (DULCOLAX) 10 MG suppository Place 1 suppository (10 mg total) rectally daily as needed for moderate constipation. 12 suppository 0  . brimonidine (ALPHAGAN) 0.2 % ophthalmic solution Place 1 drop into both eyes 2 (two) times daily.    . budesonide-formoterol (SYMBICORT) 160-4.5 MCG/ACT inhaler Inhale 2 puffs into the lungs 2 (two) times daily. 1 Inhaler 0  . donepezil (ARICEPT) 5 MG tablet Take 1 tablet (5 mg total) by mouth at bedtime. 30 tablet 0  . dorzolamide-timolol (COSOPT) 22.3-6.8 MG/ML ophthalmic solution Place 1 drop into both eyes 2 (two) times daily.    . ferrous sulfate 325 (65 FE) MG tablet Take 325 mg by mouth 2 (two) times daily with a meal.     . furosemide (LASIX) 80 MG tablet Take 1 tablet (80 mg total) by mouth daily. 30 tablet 0  . glimepiride (AMARYL) 4 MG  tablet Take 0.5 tablets (2 mg total) by mouth daily with breakfast. 30 tablet 0  . glucose blood (ACCU-CHEK AVIVA) test strip 1 each by Other route 2 (two) times daily. 200 each 5  . Ipratropium-Albuterol (COMBIVENT RESPIMAT) 20-100 MCG/ACT AERS respimat Inhale 1-2 puffs into the lungs every 4 (four) hours as needed for wheezing.     . lamoTRIgine (LAMICTAL) 100 MG tablet Take 1 tablet (100 mg total) by mouth every evening. (Patient taking differently: Take 100 mg by mouth daily after supper. ) 30 tablet 0  . latanoprost (XALATAN) 0.005 % ophthalmic solution Place 1 drop into both eyes at bedtime. 2.5 mL 12  . Magnesium 250 MG TABS Take 250 mg by mouth See admin instructions. Take 1 tablet (250 mg) by mouth every other day after supper    . Menthol, Topical Analgesic, (BIOFREEZE EX) Apply 1 application topically 2 (two) times daily as needed (back pain).    . Phenylephrine-APAP-Guaifenesin (MUCINEX SINUS-MAX) 10-650-400 MG/20ML LIQD Take 20 mLs by mouth 2 (two) times daily.    . polyethylene glycol (MIRALAX / GLYCOLAX) packet Take 17 g by mouth daily. (Patient taking differently: Take 17 g by mouth every other day. Mix in 4-8 oz liquid and drink) 14 each 0  . Respiratory Therapy Supplies (FLUTTER) DEVI Use as directed. 1 each 0  . Selenium Sulf-Pyrithione-Urea 2.25 %  SHAM Apply 1 application topically See admin instructions. Apply topically to scalp for dermatitis once every 14 days  3  . senna (SENOKOT) 8.6 MG tablet Take 1 tablet by mouth daily as needed for constipation.     . sildenafil (REVATIO) 20 MG tablet Take 1 tablet (20 mg total) by mouth 3 (three) times daily. 90 tablet 0   No current facility-administered medications on file prior to visit.

## 2019-07-18 NOTE — Telephone Encounter (Signed)
Lvm for nurse , pt has appt coming up

## 2019-07-19 ENCOUNTER — Inpatient Hospital Stay (HOSPITAL_COMMUNITY)
Admission: EM | Admit: 2019-07-19 | Discharge: 2019-08-01 | DRG: 291 | Disposition: A | Discharge status: Hospice | Payer: Medicare Other | Attending: Internal Medicine | Admitting: Internal Medicine

## 2019-07-19 ENCOUNTER — Emergency Department (HOSPITAL_COMMUNITY): Payer: Medicare Other

## 2019-07-19 ENCOUNTER — Encounter (HOSPITAL_COMMUNITY): Payer: Self-pay | Admitting: *Deleted

## 2019-07-19 ENCOUNTER — Other Ambulatory Visit: Payer: Self-pay

## 2019-07-19 DIAGNOSIS — K449 Diaphragmatic hernia without obstruction or gangrene: Secondary | ICD-10-CM | POA: Diagnosis present

## 2019-07-19 DIAGNOSIS — E8779 Other fluid overload: Secondary | ICD-10-CM | POA: Diagnosis not present

## 2019-07-19 DIAGNOSIS — E876 Hypokalemia: Secondary | ICD-10-CM | POA: Diagnosis not present

## 2019-07-19 DIAGNOSIS — Z6838 Body mass index (BMI) 38.0-38.9, adult: Secondary | ICD-10-CM

## 2019-07-19 DIAGNOSIS — I130 Hypertensive heart and chronic kidney disease with heart failure and stage 1 through stage 4 chronic kidney disease, or unspecified chronic kidney disease: Principal | ICD-10-CM | POA: Diagnosis present

## 2019-07-19 DIAGNOSIS — I50813 Acute on chronic right heart failure: Secondary | ICD-10-CM | POA: Diagnosis not present

## 2019-07-19 DIAGNOSIS — I5084 End stage heart failure: Secondary | ICD-10-CM | POA: Diagnosis present

## 2019-07-19 DIAGNOSIS — J432 Centrilobular emphysema: Secondary | ICD-10-CM | POA: Diagnosis not present

## 2019-07-19 DIAGNOSIS — W19XXXA Unspecified fall, initial encounter: Secondary | ICD-10-CM | POA: Diagnosis present

## 2019-07-19 DIAGNOSIS — M79605 Pain in left leg: Secondary | ICD-10-CM | POA: Diagnosis not present

## 2019-07-19 DIAGNOSIS — Z87891 Personal history of nicotine dependence: Secondary | ICD-10-CM

## 2019-07-19 DIAGNOSIS — Z9981 Dependence on supplemental oxygen: Secondary | ICD-10-CM

## 2019-07-19 DIAGNOSIS — Z66 Do not resuscitate: Secondary | ICD-10-CM | POA: Diagnosis present

## 2019-07-19 DIAGNOSIS — I129 Hypertensive chronic kidney disease with stage 1 through stage 4 chronic kidney disease, or unspecified chronic kidney disease: Secondary | ICD-10-CM

## 2019-07-19 DIAGNOSIS — R4182 Altered mental status, unspecified: Secondary | ICD-10-CM | POA: Diagnosis not present

## 2019-07-19 DIAGNOSIS — Z79899 Other long term (current) drug therapy: Secondary | ICD-10-CM

## 2019-07-19 DIAGNOSIS — I509 Heart failure, unspecified: Secondary | ICD-10-CM

## 2019-07-19 DIAGNOSIS — I959 Hypotension, unspecified: Secondary | ICD-10-CM | POA: Diagnosis not present

## 2019-07-19 DIAGNOSIS — G4733 Obstructive sleep apnea (adult) (pediatric): Secondary | ICD-10-CM | POA: Diagnosis present

## 2019-07-19 DIAGNOSIS — Z7951 Long term (current) use of inhaled steroids: Secondary | ICD-10-CM | POA: Diagnosis not present

## 2019-07-19 DIAGNOSIS — I2721 Secondary pulmonary arterial hypertension: Secondary | ICD-10-CM | POA: Diagnosis not present

## 2019-07-19 DIAGNOSIS — I272 Pulmonary hypertension, unspecified: Secondary | ICD-10-CM | POA: Diagnosis not present

## 2019-07-19 DIAGNOSIS — E1122 Type 2 diabetes mellitus with diabetic chronic kidney disease: Secondary | ICD-10-CM | POA: Diagnosis present

## 2019-07-19 DIAGNOSIS — F319 Bipolar disorder, unspecified: Secondary | ICD-10-CM | POA: Diagnosis present

## 2019-07-19 DIAGNOSIS — R52 Pain, unspecified: Secondary | ICD-10-CM | POA: Diagnosis not present

## 2019-07-19 DIAGNOSIS — Z8249 Family history of ischemic heart disease and other diseases of the circulatory system: Secondary | ICD-10-CM

## 2019-07-19 DIAGNOSIS — R627 Adult failure to thrive: Secondary | ICD-10-CM | POA: Diagnosis present

## 2019-07-19 DIAGNOSIS — N390 Urinary tract infection, site not specified: Secondary | ICD-10-CM | POA: Diagnosis present

## 2019-07-19 DIAGNOSIS — F259 Schizoaffective disorder, unspecified: Secondary | ICD-10-CM | POA: Diagnosis present

## 2019-07-19 DIAGNOSIS — N184 Chronic kidney disease, stage 4 (severe): Secondary | ICD-10-CM | POA: Diagnosis present

## 2019-07-19 DIAGNOSIS — I5043 Acute on chronic combined systolic (congestive) and diastolic (congestive) heart failure: Secondary | ICD-10-CM | POA: Diagnosis present

## 2019-07-19 DIAGNOSIS — I2510 Atherosclerotic heart disease of native coronary artery without angina pectoris: Secondary | ICD-10-CM | POA: Diagnosis present

## 2019-07-19 DIAGNOSIS — Z7982 Long term (current) use of aspirin: Secondary | ICD-10-CM

## 2019-07-19 DIAGNOSIS — Z8719 Personal history of other diseases of the digestive system: Secondary | ICD-10-CM

## 2019-07-19 DIAGNOSIS — J9621 Acute and chronic respiratory failure with hypoxia: Secondary | ICD-10-CM | POA: Diagnosis not present

## 2019-07-19 DIAGNOSIS — Z885 Allergy status to narcotic agent status: Secondary | ICD-10-CM

## 2019-07-19 DIAGNOSIS — I2729 Other secondary pulmonary hypertension: Secondary | ICD-10-CM | POA: Diagnosis not present

## 2019-07-19 DIAGNOSIS — Z7401 Bed confinement status: Secondary | ICD-10-CM

## 2019-07-19 DIAGNOSIS — E8770 Fluid overload, unspecified: Secondary | ICD-10-CM | POA: Diagnosis not present

## 2019-07-19 DIAGNOSIS — I5033 Acute on chronic diastolic (congestive) heart failure: Secondary | ICD-10-CM | POA: Diagnosis not present

## 2019-07-19 DIAGNOSIS — I2609 Other pulmonary embolism with acute cor pulmonale: Secondary | ICD-10-CM | POA: Diagnosis present

## 2019-07-19 DIAGNOSIS — Z90710 Acquired absence of both cervix and uterus: Secondary | ICD-10-CM

## 2019-07-19 DIAGNOSIS — R600 Localized edema: Secondary | ICD-10-CM

## 2019-07-19 DIAGNOSIS — Z7984 Long term (current) use of oral hypoglycemic drugs: Secondary | ICD-10-CM

## 2019-07-19 DIAGNOSIS — R29810 Facial weakness: Secondary | ICD-10-CM | POA: Diagnosis not present

## 2019-07-19 DIAGNOSIS — Z515 Encounter for palliative care: Secondary | ICD-10-CM

## 2019-07-19 DIAGNOSIS — Z833 Family history of diabetes mellitus: Secondary | ICD-10-CM

## 2019-07-19 DIAGNOSIS — Z888 Allergy status to other drugs, medicaments and biological substances status: Secondary | ICD-10-CM

## 2019-07-19 DIAGNOSIS — R609 Edema, unspecified: Secondary | ICD-10-CM | POA: Diagnosis present

## 2019-07-19 DIAGNOSIS — E785 Hyperlipidemia, unspecified: Secondary | ICD-10-CM | POA: Diagnosis present

## 2019-07-19 DIAGNOSIS — N179 Acute kidney failure, unspecified: Secondary | ICD-10-CM | POA: Diagnosis not present

## 2019-07-19 DIAGNOSIS — E871 Hypo-osmolality and hyponatremia: Secondary | ICD-10-CM | POA: Diagnosis not present

## 2019-07-19 DIAGNOSIS — Z20828 Contact with and (suspected) exposure to other viral communicable diseases: Secondary | ICD-10-CM | POA: Diagnosis present

## 2019-07-19 DIAGNOSIS — Z7189 Other specified counseling: Secondary | ICD-10-CM | POA: Diagnosis not present

## 2019-07-19 DIAGNOSIS — R5383 Other fatigue: Secondary | ICD-10-CM | POA: Diagnosis not present

## 2019-07-19 DIAGNOSIS — K219 Gastro-esophageal reflux disease without esophagitis: Secondary | ICD-10-CM | POA: Diagnosis present

## 2019-07-19 DIAGNOSIS — J449 Chronic obstructive pulmonary disease, unspecified: Secondary | ICD-10-CM | POA: Diagnosis present

## 2019-07-19 DIAGNOSIS — M79604 Pain in right leg: Secondary | ICD-10-CM | POA: Diagnosis not present

## 2019-07-19 DIAGNOSIS — E669 Obesity, unspecified: Secondary | ICD-10-CM | POA: Diagnosis present

## 2019-07-19 DIAGNOSIS — I2781 Cor pulmonale (chronic): Secondary | ICD-10-CM | POA: Diagnosis not present

## 2019-07-19 DIAGNOSIS — I2722 Pulmonary hypertension due to left heart disease: Secondary | ICD-10-CM | POA: Diagnosis present

## 2019-07-19 DIAGNOSIS — H409 Unspecified glaucoma: Secondary | ICD-10-CM | POA: Diagnosis present

## 2019-07-19 DIAGNOSIS — I2723 Pulmonary hypertension due to lung diseases and hypoxia: Secondary | ICD-10-CM | POA: Diagnosis present

## 2019-07-19 DIAGNOSIS — Z85528 Personal history of other malignant neoplasm of kidney: Secondary | ICD-10-CM

## 2019-07-19 DIAGNOSIS — N183 Chronic kidney disease, stage 3 (moderate): Secondary | ICD-10-CM | POA: Diagnosis not present

## 2019-07-19 LAB — BASIC METABOLIC PANEL
Anion gap: 13 (ref 5–15)
Anion gap: 12 (ref 5–15)
BUN: 54 mg/dL — ABNORMAL HIGH (ref 8–23)
BUN: 59 mg/dL — ABNORMAL HIGH (ref 8–23)
CO2: 22 mmol/L (ref 22–32)
CO2: 19 mmol/L — ABNORMAL LOW (ref 22–32)
Calcium: 9.8 mg/dL (ref 8.9–10.3)
Calcium: 8.8 mg/dL — ABNORMAL LOW (ref 8.9–10.3)
Chloride: 92 mmol/L — ABNORMAL LOW (ref 98–111)
Chloride: 91 mmol/L — ABNORMAL LOW (ref 98–111)
Creatinine, Ser: 3.07 mg/dL — ABNORMAL HIGH (ref 0.44–1.00)
Creatinine, Ser: 3.08 mg/dL — ABNORMAL HIGH (ref 0.44–1.00)
GFR calc Af Amer: 17 mL/min — ABNORMAL LOW (ref >60)
GFR calc Af Amer: 17 mL/min — ABNORMAL LOW (ref >60)
GFR calc non Af Amer: 15 mL/min — ABNORMAL LOW (ref >60)
GFR calc non Af Amer: 15 mL/min — ABNORMAL LOW (ref >60)
Glucose, Bld: 78 mg/dL (ref 70–99)
Glucose, Bld: 126 mg/dL — ABNORMAL HIGH (ref 70–99)
Potassium: 4 mmol/L (ref 3.5–5.1)
Potassium: 3.9 mmol/L (ref 3.5–5.1)
Sodium: 127 mmol/L — ABNORMAL LOW (ref 135–145)
Sodium: 122 mmol/L — ABNORMAL LOW (ref 135–145)

## 2019-07-19 LAB — TROPONIN I (HIGH SENSITIVITY)
Troponin I (High Sensitivity): 92 ng/L — ABNORMAL HIGH (ref <18)
Troponin I (High Sensitivity): 189 ng/L (ref <18)
Troponin I (High Sensitivity): 216 ng/L (ref <18)

## 2019-07-19 LAB — HEPATIC FUNCTION PANEL
ALT: 22 U/L (ref 0–44)
AST: 39 U/L (ref 15–41)
Albumin: 3.3 g/dL — ABNORMAL LOW (ref 3.5–5.0)
Alkaline Phosphatase: 77 U/L (ref 38–126)
Bilirubin, Direct: 0.4 mg/dL — ABNORMAL HIGH (ref 0.0–0.2)
Indirect Bilirubin: 0.8 mg/dL (ref 0.3–0.9)
Total Bilirubin: 1.2 mg/dL (ref 0.3–1.2)
Total Protein: 6.2 g/dL — ABNORMAL LOW (ref 6.5–8.1)

## 2019-07-19 LAB — GLUCOSE, CAPILLARY
Glucose-Capillary: 130 mg/dL — ABNORMAL HIGH (ref 70–99)
Glucose-Capillary: 101 mg/dL — ABNORMAL HIGH (ref 70–99)

## 2019-07-19 LAB — CBC
HCT: 46.7 % — ABNORMAL HIGH (ref 36.0–46.0)
Hemoglobin: 14.5 g/dL (ref 12.0–15.0)
MCH: 25.4 pg — ABNORMAL LOW (ref 26.0–34.0)
MCHC: 31 g/dL (ref 30.0–36.0)
MCV: 81.8 fL (ref 80.0–100.0)
Platelets: 406 10*3/uL — ABNORMAL HIGH (ref 150–400)
RBC: 5.71 MIL/uL — ABNORMAL HIGH (ref 3.87–5.11)
RDW: 16.2 % — ABNORMAL HIGH (ref 11.5–15.5)
WBC: 4.2 10*3/uL (ref 4.0–10.5)
nRBC: 0 % (ref 0.0–0.2)

## 2019-07-19 LAB — MAGNESIUM: Magnesium: 2.5 mg/dL — ABNORMAL HIGH (ref 1.7–2.4)

## 2019-07-19 LAB — MRSA PCR SCREENING: MRSA by PCR: POSITIVE — AB

## 2019-07-19 LAB — CBG MONITORING, ED
Glucose-Capillary: 34 mg/dL — CL (ref 70–99)
Glucose-Capillary: 49 mg/dL — ABNORMAL LOW (ref 70–99)
Glucose-Capillary: 93 mg/dL (ref 70–99)

## 2019-07-19 LAB — BRAIN NATRIURETIC PEPTIDE: B Natriuretic Peptide: 1147.6 pg/mL — ABNORMAL HIGH (ref 0.0–100.0)

## 2019-07-19 LAB — SARS CORONAVIRUS 2 BY RT PCR (HOSPITAL ORDER, PERFORMED IN ~~LOC~~ HOSPITAL LAB): SARS Coronavirus 2: NEGATIVE

## 2019-07-19 MED ORDER — POTASSIUM CHLORIDE CRYS ER 10 MEQ PO TBCR
10.0000 meq | EXTENDED_RELEASE_TABLET | Freq: Two times a day (BID) | ORAL | Status: AC
  Administered 2019-07-19 – 2019-07-22 (×6): 10 meq via ORAL
  Filled 2019-07-19 (×12): qty 1

## 2019-07-19 MED ORDER — SODIUM CHLORIDE 0.9% FLUSH
3.0000 mL | Freq: Once | INTRAVENOUS | Status: AC
  Administered 2019-07-19: 08:00:00 3 mL via INTRAVENOUS

## 2019-07-19 MED ORDER — FUROSEMIDE 10 MG/ML IJ SOLN
60.0000 mg | Freq: Once | INTRAMUSCULAR | Status: AC
  Administered 2019-07-19: 08:00:00 60 mg via INTRAVENOUS
  Filled 2019-07-19: qty 6

## 2019-07-19 MED ORDER — FUROSEMIDE 10 MG/ML IJ SOLN: 80.0000 mg | Freq: Two times a day (BID) | INTRAMUSCULAR | Status: DC

## 2019-07-19 MED ORDER — LATANOPROST 0.005 % OP SOLN
1.0000 [drp] | Freq: Every day | OPHTHALMIC | Status: DC
  Administered 2019-07-19 – 2019-07-31 (×13): 1 [drp] via OPHTHALMIC
  Filled 2019-07-19: qty 2.5

## 2019-07-19 MED ORDER — MUPIROCIN 2 % EX OINT
1.0000 "application " | TOPICAL_OINTMENT | Freq: Two times a day (BID) | CUTANEOUS | Status: AC
  Administered 2019-07-19 – 2019-07-24 (×10): 1 via NASAL
  Filled 2019-07-19: qty 22

## 2019-07-19 MED ORDER — MOMETASONE FURO-FORMOTEROL FUM 200-5 MCG/ACT IN AERO
2.0000 | INHALATION_SPRAY | Freq: Two times a day (BID) | RESPIRATORY_TRACT | Status: DC
  Administered 2019-07-19 – 2019-08-01 (×23): 2 via RESPIRATORY_TRACT
  Filled 2019-07-19: qty 8.8

## 2019-07-19 MED ORDER — LAMOTRIGINE 100 MG PO TABS
100.0000 mg | ORAL_TABLET | Freq: Every evening | ORAL | Status: DC
  Administered 2019-07-19 – 2019-07-31 (×11): 100 mg via ORAL
  Filled 2019-07-19 (×13): qty 1

## 2019-07-19 MED ORDER — INSULIN ASPART 100 UNIT/ML ~~LOC~~ SOLN: 0.0000 [IU] | Freq: Three times a day (TID) | SUBCUTANEOUS | Status: DC

## 2019-07-19 MED ORDER — DONEPEZIL HCL 5 MG PO TABS
5.0000 mg | ORAL_TABLET | Freq: Every day | ORAL | Status: DC
  Administered 2019-07-19 – 2019-07-31 (×12): 5 mg via ORAL
  Filled 2019-07-19 (×14): qty 1

## 2019-07-19 MED ORDER — BRIMONIDINE TARTRATE 0.2 % OP SOLN
1.0000 [drp] | Freq: Two times a day (BID) | OPHTHALMIC | Status: DC
  Administered 2019-07-19 – 2019-08-01 (×26): 1 [drp] via OPHTHALMIC
  Filled 2019-07-19: qty 5

## 2019-07-19 MED ORDER — ATORVASTATIN CALCIUM 10 MG PO TABS
20.0000 mg | ORAL_TABLET | Freq: Every day | ORAL | Status: DC
  Administered 2019-07-19: 18:00:00 20 mg via ORAL
  Filled 2019-07-19: qty 2

## 2019-07-19 MED ORDER — ASPIRIN 81 MG PO TBEC: 81.0000 mg | DELAYED_RELEASE_TABLET | Freq: Every day | ORAL | Status: DC

## 2019-07-19 MED ORDER — ENOXAPARIN SODIUM 30 MG/0.3ML ~~LOC~~ SOLN
30.0000 mg | SUBCUTANEOUS | Status: DC
  Administered 2019-07-19 – 2019-07-28 (×10): 30 mg via SUBCUTANEOUS
  Filled 2019-07-19 (×10): qty 0.3

## 2019-07-19 MED ORDER — SODIUM CHLORIDE 0.9% FLUSH
3.0000 mL | Freq: Two times a day (BID) | INTRAVENOUS | Status: DC
  Administered 2019-07-19 – 2019-08-01 (×21): 3 mL via INTRAVENOUS

## 2019-07-19 MED ORDER — FERROUS SULFATE 325 (65 FE) MG PO TABS
325.0000 mg | ORAL_TABLET | Freq: Every day | ORAL | Status: DC
  Administered 2019-07-20 – 2019-07-29 (×7): 325 mg via ORAL
  Filled 2019-07-19 (×7): qty 1

## 2019-07-19 MED ORDER — ALBUTEROL SULFATE (2.5 MG/3ML) 0.083% IN NEBU: 2.5000 mg | INHALATION_SOLUTION | RESPIRATORY_TRACT | Status: DC | PRN

## 2019-07-19 MED ORDER — FUROSEMIDE 10 MG/ML IJ SOLN
60.0000 mg | Freq: Two times a day (BID) | INTRAMUSCULAR | Status: DC
  Administered 2019-07-19: 20:00:00 60 mg via INTRAVENOUS
  Filled 2019-07-19: qty 6

## 2019-07-19 MED ORDER — INSULIN ASPART 100 UNIT/ML ~~LOC~~ SOLN
0.0000 [IU] | Freq: Three times a day (TID) | SUBCUTANEOUS | Status: DC
  Administered 2019-07-20 – 2019-07-26 (×4): 1 [IU] via SUBCUTANEOUS
  Administered 2019-07-26: 12:00:00 2 [IU] via SUBCUTANEOUS
  Administered 2019-07-27 – 2019-07-29 (×4): 1 [IU] via SUBCUTANEOUS
  Administered 2019-07-29: 11:00:00 3 [IU] via SUBCUTANEOUS

## 2019-07-19 MED ORDER — SPIRONOLACTONE 25 MG PO TABS: 25.0000 mg | ORAL_TABLET | Freq: Every day | ORAL | Status: DC

## 2019-07-19 MED ORDER — ORAL CARE MOUTH RINSE
15.0000 mL | Freq: Two times a day (BID) | OROMUCOSAL | Status: DC
  Administered 2019-07-19 – 2019-08-01 (×24): 15 mL via OROMUCOSAL

## 2019-07-19 MED ORDER — ACETAMINOPHEN 650 MG RE SUPP: 650.0000 mg | Freq: Four times a day (QID) | RECTAL | Status: DC | PRN

## 2019-07-19 MED ORDER — SILDENAFIL CITRATE 20 MG PO TABS
20.0000 mg | ORAL_TABLET | Freq: Three times a day (TID) | ORAL | Status: DC
  Administered 2019-07-19 – 2019-07-27 (×21): 20 mg via ORAL
  Filled 2019-07-19 (×25): qty 1

## 2019-07-19 MED ORDER — METOLAZONE 5 MG PO TABS
5.0000 mg | ORAL_TABLET | Freq: Every day | ORAL | Status: DC
  Administered 2019-07-19: 22:00:00 5 mg via ORAL
  Filled 2019-07-19 (×2): qty 1

## 2019-07-19 MED ORDER — DORZOLAMIDE HCL-TIMOLOL MAL 2-0.5 % OP SOLN
1.0000 [drp] | Freq: Two times a day (BID) | OPHTHALMIC | Status: DC
  Administered 2019-07-19 – 2019-08-01 (×26): 1 [drp] via OPHTHALMIC
  Filled 2019-07-19: qty 10

## 2019-07-19 MED ORDER — ACETAMINOPHEN 325 MG PO TABS
650.0000 mg | ORAL_TABLET | Freq: Four times a day (QID) | ORAL | Status: DC | PRN
  Administered 2019-07-19 – 2019-07-31 (×7): 650 mg via ORAL
  Filled 2019-07-19 (×7): qty 2

## 2019-07-19 MED ORDER — CHLORHEXIDINE GLUCONATE CLOTH 2 % EX PADS: 6.0000 | MEDICATED_PAD | Freq: Every day | CUTANEOUS | Status: DC

## 2019-07-19 MED ORDER — GUAIFENESIN ER 600 MG PO TB12
600.0000 mg | ORAL_TABLET | Freq: Two times a day (BID) | ORAL | Status: DC | PRN
  Administered 2019-07-28: 04:00:00 600 mg via ORAL
  Filled 2019-07-19: qty 1

## 2019-07-19 MED ORDER — ARIPIPRAZOLE 10 MG PO TABS
20.0000 mg | ORAL_TABLET | Freq: Every day | ORAL | Status: DC
  Administered 2019-07-19 – 2019-07-31 (×12): 20 mg via ORAL
  Filled 2019-07-19 (×14): qty 2

## 2019-07-19 MED ORDER — ASPIRIN EC 81 MG PO TBEC
81.0000 mg | DELAYED_RELEASE_TABLET | Freq: Every day | ORAL | Status: DC
  Administered 2019-07-20 – 2019-08-01 (×10): 81 mg via ORAL
  Filled 2019-07-19 (×10): qty 1

## 2019-07-19 MED ORDER — MUPIROCIN 2 % EX OINT: 1.0000 "application " | TOPICAL_OINTMENT | Freq: Two times a day (BID) | CUTANEOUS | Status: DC

## 2019-07-19 MED ORDER — CHLORHEXIDINE GLUCONATE CLOTH 2 % EX PADS
6.0000 | MEDICATED_PAD | Freq: Every day | CUTANEOUS | Status: AC
  Administered 2019-07-20 – 2019-07-22 (×3): 6 via TOPICAL

## 2019-07-19 MED ORDER — POLYETHYLENE GLYCOL 3350 17 G PO PACK: 17.0000 g | PACK | Freq: Every day | ORAL | Status: DC | PRN

## 2019-07-19 MED ORDER — IPRATROPIUM-ALBUTEROL 0.5-2.5 (3) MG/3ML IN SOLN: 3.0000 mL | RESPIRATORY_TRACT | Status: DC | PRN

## 2019-07-19 MED ORDER — IPRATROPIUM-ALBUTEROL 20-100 MCG/ACT IN AERS: 1.0000 | INHALATION_SPRAY | RESPIRATORY_TRACT | Status: DC | PRN

## 2019-07-19 NOTE — ED Provider Notes (Signed)
Emergency Department Provider Note   I have reviewed the triage vital signs and the nursing notes.   HISTORY  Chief Complaint Leg Swelling   HPI Elizabeth Thompson is a 68 y.o. female with past medical history reviewed below presents to the emergency department with lightheadedness, fall, significant leg swelling.  Patient has end-stage CHF and is currently on Lasix 80 mg daily.  She states she got up to go to the bathroom when she felt "very weak" and fell to the ground without head trauma.  She denies any pains in the arms or legs.  No back pain.  Patient states that she is noticed significant swelling in her legs over the past week despite taking her home medications.  She denies any chest pain, heart palpitations, shortness of breath prior to fall.  No headache.  No unilateral weakness or numbness.  No vision changes.  Patient lives with her niece and is currently saving home health services through palliative care.  She is no longer in hospice but is DNR. Primary Cardiologist if Dr. Einar Gip.   Past Medical History:  Diagnosis Date  . Acute respiratory distress 03/28/2019  . Acute respiratory failure ([place]) 06/09/2017  . Arthritis   . Back pain   . Bell's palsy   . Bipolar disorder ([place])   . Bronchitis   . Chronic low back pain 05/09/2015  . Cough with expectoration 05/21/16   recent diagnosis of bronchitis  . Cyst of right kidney   . Diabetes mellitus without complication ([place])    type 2  . Frequency of urination   . GERD (gastroesophageal reflux disease)   . Glaucoma   . History of blood transfusion   . History of colon polyps   . History of hiatal hernia   . History of tobacco use   . Hypercalcemia   . Hyperlipidemia   . Hypertension   . Memory disorder 09/05/2014  . On home oxygen therapy    3L/M [place] at night   . Schizo-affective psychosis ([place])   . Shortness of breath dyspnea    exertion, or with out oxygen  . Uterine fibroid     Patient Active Problem List   Diagnosis Date Noted  . CHF exacerbation ([place]) 07/19/2019  . Acute on chronic diastolic CHF (congestive heart failure) ([place])   . FTT (failure to thrive) in adult   . CKD (chronic kidney disease) stage 4, GFR 15-29 ml/min (HCC)   . Cor pulmonale, chronic ([place])   . Acute on chronic right-sided heart failure ([place])   . Hyponatremia with excess extracellular fluid volume   . Goals of care, counseling/discussion   . Palliative care by specialist   . OSA (obstructive sleep apnea)   . AKI (acute kidney injury) ([place]) 01/03/2019  . Acute respiratory failure ([place]) 01/03/2019  . Hypertension 07/02/2017  . Chronic respiratory failure with hypoxia and hypercapnia ([place]) 07/02/2017  . COPD (chronic obstructive pulmonary disease) ([place]) 07/02/2017  . Diabetes mellitus without complication ([place]) 06/12/1600  . Pulmonary hypertension ([place]) 07/02/2017  . Bipolar disorder ([place]) 07/02/2017  . Hyperlipidemia 07/02/2017  . Acute hyponatremia 07/02/2017  . Acute metabolic encephalopathy 09/32/3557  . CKD (chronic kidney disease) stage 3, GFR 30-59 ml/min (HCC) 07/02/2017  . Elevated troponin   . Cor pulmonale, acute ([place])   . Acute on chronic respiratory failure ([place]) 06/09/2017  . Chronic renal disease, stage III ([place]) 05/05/2017  . Chronic respiratory failure with hypoxia ([place]) 05/05/2017  . Renal cell carcinoma, left (HCC)  02/05/2017  . Dyspnea 12/28/2016  . Depression 09/04/2016  . Renal malignant tumor ([place]) 06/26/2016  . GERD (gastroesophageal reflux disease) 03/22/2016  . Insomnia 03/22/2016  . COPD GOLD 0  12/06/2015  . Essential hypertension 12/06/2015  . COPD with acute exacerbation ([place]) 11/20/2015  . Multiple lung nodules 11/20/2015  . Morbid obesity due to excess calories ([place]) 11/20/2015  . Hypersomnia with sleep apnea 11/20/2015  . Diabetes mellitus type 2, noninsulin dependent ([place]) 11/06/2015  . HLD (hyperlipidemia) 11/06/2015  . Chronic low back pain 05/09/2015  . Gait disorder  05/09/2015  . Memory disorder 09/05/2014  . Schizoaffective disorder ([place]) 08/18/2014    Past Surgical History:  Procedure Laterality Date  . ABDOMINAL HYSTERECTOMY    . APPENDECTOMY    . COLONOSCOPY W/ POLYPECTOMY    . IR GENERIC HISTORICAL  04/29/2016   IR RADIOLOGIST EVAL & MGMT 04/29/2016 Corrie Mckusick, DO [facility] INTERV RAD  . IR GENERIC HISTORICAL  10/01/2016   IR RADIOLOGIST EVAL & MGMT 10/01/2016 [facility] INTERV RAD  . IR GENERIC HISTORICAL  11/11/2016   IR RADIOLOGIST EVAL & MGMT 11/11/2016 Corrie Mckusick, DO [facility] INTERV RAD  . IR GENERIC HISTORICAL  07/16/2016   IR RADIOLOGIST EVAL & MGMT 07/16/2016 Corrie Mckusick, DO [facility] INTERV RAD  . IR RADIOLOGIST EVAL & MGMT  03/10/2017  . IR RADIOLOGIST EVAL & MGMT  05/13/2017  . IR RADIOLOGIST EVAL & MGMT  04/27/2018  . IR RADIOLOGIST EVAL & MGMT  07/13/2019  . LUMBAR LAMINECTOMY/DECOMPRESSION MICRODISCECTOMY N/A 08/14/2015   Procedure: LUMBAR DECOMPRESSION MICRODISCECTOMY L3-S1;  Surgeon: Melina Schools, MD;  Location: [facility];  Service: Orthopedics;  Laterality: N/A;  . RADIOFREQUENCY ABLATION Left 02/05/2017   Procedure: LEFT RENAL CRYOABLATION;  Surgeon: Corrie Mckusick, DO;  Location: [facility];  Service: Anesthesiology;  Laterality: Left;  . RIGHT HEART CATH N/A 07/20/2017   Procedure: Right Heart Cath;  Surgeon: Nigel Mormon, MD;  Location: [facility];  Service: Cardiovascular;  Laterality: N/A;  . RIGHT/LEFT HEART CATH AND CORONARY ANGIOGRAPHY N/A 06/11/2017   Procedure: Right/Left Heart Cath and Coronary Angiography;  Surgeon: Dixie Dials, MD;  Location: [facility];  Service: Cardiovascular;  Laterality: N/A;  . TONSILLECTOMY AND ADENOIDECTOMY      Allergies Codeine, Nitrostat [nitroglycerin], and Prednisone  Family History  Problem Relation Age of Onset  . Dementia Mother   . Alzheimer's disease Mother   . Heart disease Mother   . Hypertension Mother   . Diabetes Mother   . Diabetes Father   . Alzheimer's disease  Sister   . Heart disease Brother   . Heart attack Brother   . Bladder Cancer Neg Hx   . Kidney cancer Neg Hx   . Prostate cancer Neg Hx     Social History Social History   Tobacco Use  . Smoking status: Former Smoker    Packs/day: 1.00    Years: 28.00    Pack years: 28.00    Types: Cigarettes    Quit date: 07/15/2015    Years since quitting: 4.0  . Smokeless tobacco: Never Used  Substance Use Topics  . Alcohol use: No    Alcohol/week: 0.0 standard drinks  . Drug use: No    Review of Systems  Constitutional: No fever/chills. Positive generalized weakness and fall.  Eyes: No visual changes. ENT: No sore throat. Cardiovascular: Denies chest pain. Bilateral LE edema.  Respiratory: Denies shortness of breath. Gastrointestinal: No abdominal pain.  No nausea, no vomiting.  No  diarrhea.  No constipation. Genitourinary: Negative for dysuria. Musculoskeletal: Negative for back pain. Skin: Negative for rash. Neurological: Negative for headaches, focal weakness or numbness.  10-point ROS otherwise negative.  ____________________________________________   PHYSICAL EXAM:  VITAL SIGNS: ED Triage Vitals  Enc Vitals Group     BP 07/19/19 0620 (!) 160/57     Pulse Rate 07/19/19 0620 89     Resp 07/19/19 0620 (!) 22     Temp 07/19/19 0620 97.8 F (36.6 C)     Temp Source 07/19/19 0620 Oral     SpO2 07/19/19 0619 93 %     Weight 07/19/19 0623 184 lb (83.5 kg)     Height 07/19/19 0623 _0  (1.6 m)   Constitutional: Alert and oriented. Chronically ill-appearing but in no acute distress.  Eyes: Conjunctivae are normal.  Head: Atraumatic. Nose: No congestion/rhinnorhea. Mouth/Throat: Mucous membranes are moist.  Neck: No stridor.  Cardiovascular: Normal rate, regular rhythm. Good peripheral circulation. Grossly normal heart sounds.   Respiratory: Increased respiratory effort.  No retractions. Lungs with crackles at the bases and mild end-expiratory wheezing. .  Gastrointestinal: Soft and nontender. No distention.  Musculoskeletal: No lower extremity tenderness with normal passive ROM of the bilateral hips. Pitting edema up to the mid-thigh.  Neurologic:  Normal speech and language.  Skin:  Skin is warm, dry and intact. No rash noted.  ____________________________________________   LABS (all labs ordered are listed, but only abnormal results are displayed)  Labs Reviewed  BASIC METABOLIC PANEL - Abnormal; Notable for the following components:      Result Value   Sodium 127 (*)    Chloride 92 (*)    BUN 54 (*)    Creatinine, Ser 3.07 (*)    GFR calc non Af Amer 15 (*)    GFR calc Af Amer 17 (*)    All other components within normal limits  CBC - Abnormal; Notable for the following components:   RBC 5.71 (*)    HCT 46.7 (*)    MCH 25.4 (*)    RDW 16.2 (*)    Platelets 406 (*)    All other components within normal limits  BRAIN NATRIURETIC PEPTIDE - Abnormal; Notable for the following components:   B Natriuretic Peptide 1,147.6 (*)    All other components within normal limits  HEPATIC FUNCTION PANEL - Abnormal; Notable for the following components:   Total Protein 6.2 (*)    Albumin 3.3 (*)    Bilirubin, Direct 0.4 (*)    All other components within normal limits  CBC - Abnormal; Notable for the following components:   RBC 5.86 (*)    HCT 48.8 (*)    MCH 25.3 (*)    RDW 16.3 (*)    All other components within normal limits  CREATININE, SERUM - Abnormal; Notable for the following components:   Creatinine, Ser 2.89 (*)    GFR calc non Af Amer 16 (*)    GFR calc Af Amer 19 (*)    All other components within normal limits  MAGNESIUM - Abnormal; Notable for the following components:   Magnesium 2.5 (*)    All other components within normal limits  GLUCOSE, CAPILLARY - Abnormal; Notable for the following components:   Glucose-Capillary 130 (*)    All other components within normal limits  CBG MONITORING, ED - Abnormal; Notable for  the following components:   Glucose-Capillary 34 (*)    All other components within normal limits  CBG MONITORING, ED -  Abnormal; Notable for the following components:   Glucose-Capillary 49 (*)    All other components within normal limits  TROPONIN I (HIGH SENSITIVITY) - Abnormal; Notable for the following components:   Troponin I (High Sensitivity) 92 (*)    All other components within normal limits  TROPONIN I (HIGH SENSITIVITY) - Abnormal; Notable for the following components:   Troponin I (High Sensitivity) 189 (*)    All other components within normal limits  TROPONIN I (HIGH SENSITIVITY) - Abnormal; Notable for the following components:   Troponin I (High Sensitivity) 216 (*)    All other components within normal limits  SARS CORONAVIRUS 2 (HOSPITAL ORDER, [facility])  MRSA PCR SCREENING  BASIC METABOLIC PANEL  BASIC METABOLIC PANEL  CBG MONITORING, ED  TROPONIN I (HIGH SENSITIVITY)   ____________________________________________  EKG   EKG Interpretation  Date/Time:  Wednesday July 19 2019 07:57:08 EDT Ventricular Rate:  90 PR Interval:    QRS Duration: 107 QT Interval:  387 QTC Calculation: 474 R Axis:   146 Text Interpretation:  Sinus rhythm Borderline prolonged PR interval Low voltage with right axis deviation Probable right ventricular hypertrophy Artifact improved. No STEMI  Confirmed by Nanda Quinton [phone]) on 07/19/2019 9:40:23 AM       ____________________________________________  RADIOLOGY  Dg Chest Portable 1 View  Result Date: 07/19/2019 CLINICAL DATA:  Shortness of breath EXAM: PORTABLE CHEST 1 VIEW COMPARISON:  03/29/2019 FINDINGS: Chronic cardiomegaly and interstitial coarsening. Negative mediastinal contours when accounting for leftward rotation. There is no edema, consolidation, effusion, or pneumothorax. IMPRESSION: No acute finding when compared to prior. Chronic interstitial disease and cardiomegaly. Electronically  Signed   By: Monte Fantasia M.D.   On: 07/19/2019 07:05    ____________________________________________   PROCEDURES  Procedure(s) performed:   Procedures  CRITICAL CARE Performed by: Margette Fast Total critical care time: 35 minutes Critical care time was exclusive of separately billable procedures and treating other patients. Critical care was necessary to treat or prevent imminent or life-threatening deterioration. Critical care was time spent personally by me on the following activities: development of treatment plan with patient and/or surrogate as well as nursing, discussions with consultants, evaluation of patient's response to treatment, examination of patient, obtaining history from patient or surrogate, ordering and performing treatments and interventions, ordering and review of laboratory studies, ordering and review of radiographic studies, pulse oximetry and re-evaluation of patient's condition.  Nanda Quinton, MD Emergency Medicine  ____________________________________________   INITIAL IMPRESSION / ASSESSMENT AND PLAN / ED COURSE  Pertinent labs & imaging results that were available during my care of the patient were reviewed by me and considered in my medical decision making (see chart for details).   Patient presents to the emergency department for evaluation of bilateral lower extremity swelling, lightheaded episode, fall.  No evidence of head trauma.  Normal range of motion of the hips bilaterally.  Her EKG shows significant artifact and low amplitude QRS complexes.  Plan for repeat but no clear ischemia.  I have added on hepatic function panel and troponin in addition to labs ordered from triage including BNP.  Patient's chest x-ray appears similar to baseline according to radiology read.  She has cardiomegaly and chronic interstitial disease noted.   Patient evaluated by Dr. Einar Gip. Will diuresis and admit to medicine service. Goals of care discussion to follow with  Dr. Einar Gip and patient/family.   Discussed patient's case with Medicine to request admission. Patient and family (if present) updated  with plan. Care transferred to Medicine service.  I reviewed all nursing notes, vitals, pertinent old records, EKGs, labs, imaging (as available).  ____________________________________________  FINAL CLINICAL IMPRESSION(S) / ED DIAGNOSES  Final diagnoses:  Other hypervolemia  Peripheral edema     MEDICATIONS GIVEN DURING THIS VISIT:  Medications  enoxaparin (LOVENOX) injection 30 mg (30 mg Subcutaneous Given 07/19/19 1746)  sodium chloride flush (NS) 0.9 % injection 3 mL (3 mLs Intravenous Given 07/19/19 1308)  acetaminophen (TYLENOL) tablet 650 mg (650 mg Oral Given 07/19/19 1439)    Or  acetaminophen (TYLENOL) suppository 650 mg ( Rectal See Alternative 07/19/19 1439)  polyethylene glycol (MIRALAX / GLYCOLAX) packet 17 g (has no administration in time range)  atorvastatin (LIPITOR) tablet 20 mg (20 mg Oral Given 07/19/19 1747)  sildenafil (REVATIO) tablet 20 mg (20 mg Oral Given 07/19/19 1747)  ARIPiprazole (ABILIFY) tablet 20 mg (has no administration in time range)  donepezil (ARICEPT) tablet 5 mg (has no administration in time range)  ferrous sulfate tablet 325 mg (has no administration in time range)  lamoTRIgine (LAMICTAL) tablet 100 mg (100 mg Oral Given 07/19/19 1953)  mometasone-formoterol (DULERA) 200-5 MCG/ACT inhaler 2 puff (2 puffs Inhalation Not Given 07/19/19 1117)  guaiFENesin (MUCINEX) 12 hr tablet 600 mg (has no administration in time range)  albuterol (PROVENTIL) (2.5 MG/3ML) 0.083% nebulizer solution 2.5 mg (has no administration in time range)  brimonidine (ALPHAGAN) 0.2 % ophthalmic solution 1 drop (1 drop Both Eyes Given 07/19/19 2002)  dorzolamide-timolol (COSOPT) 22.3-6.8 MG/ML ophthalmic solution 1 drop (1 drop Both Eyes Given 07/19/19 2003)  latanoprost (XALATAN) 0.005 % ophthalmic solution 1 drop (has no administration in time range)   insulin aspart (novoLOG) injection 0-9 Units (0 Units Subcutaneous Not Given 07/19/19 1700)  aspirin EC tablet 81 mg (has no administration in time range)  ipratropium-albuterol (DUONEB) 0.5-2.5 (3) MG/3ML nebulizer solution 3 mL (has no administration in time range)  furosemide (LASIX) injection 60 mg (60 mg Intravenous Given 07/19/19 1953)  sodium chloride flush (NS) 0.9 % injection 3 mL (3 mLs Intravenous Given 07/19/19 0807)  furosemide (LASIX) injection 60 mg (60 mg Intravenous Given 07/19/19 0803)     Note:  This document was prepared using Dragon voice recognition software and may include unintentional dictation errors.  Nanda Quinton, MD Emergency Medicine    Crystin Lechtenberg, [facility], MD 07/19/19 2012

## 2019-07-19 NOTE — ED Triage Notes (Signed)
Patient presents to ed via [facility] ,states \the call came out as a fall, states she was going to the bathroom and was weak and fell. Denies injury from the fall. C/o increased swelling in legs since she started taking spironolactone several months ago. Patient currently has exp and insp. Wheezing.

## 2019-07-19 NOTE — ED Notes (Signed)
Hold spiralactone due to BP

## 2019-07-19 NOTE — H&P (Addendum)
Date: 07/19/2019               Patient Name:  Elizabeth Thompson MRN: [id number]  DOB: [birth year] Age / Sex: 68 y.o., female   PCP: Everardo Beals, NP         Medical Service: Internal Medicine Teaching Service         Attending Physician: Dr. Sid Falcon, MD    First Contact: Dr. Ronnald Ramp Pager: [phone]  Second Contact: Dr. Shan Levans Pager: [phone]       After Hours (After 5p/  First Contact Pager: [phone]  weekends / holidays): Second Contact Pager: [phone]   Chief Complaint: Fall, Leg Swelling  History of Present Illness: Elizabeth Thompson is a 68 yo F with Hx of End Stage COPD, Heart Failure (diastolic LV failure and systolic RV failure), CKD 4, Diabetes, OSA, and Memory disorder who presented to the ED today fallowing a fall at home and was found to be volume overloaded. She reports she was walking to the bathroom, felt weak, and fell to the ground. She did not hit her head and does not report any residual pain or notable injuries from the fall. She was noted to be volume overload and reported ongoing worsening of her LE edema when seen. She states she has noticed worsening edema for about 2 weeks. She denies changes in diet or medications. She denies any changes in her chronic dyspnea and is saturating appropriately on her home 7-8L.   ED workup significant for BNP 1197, hsTrop 92, Cr 3 (up from ~2.3.), Na 127, LFTs WNL, EKG without ischemic changes, CXR with cardiomegaly and venous congestion, but stable from previous.   Meds: Current Meds  Medication Sig  . acetaminophen (TYLENOL) 650 MG CR tablet Take 1,300 mg by mouth See admin instructions. Take 2 tablets (1300 mg) by mouth every morning, make take 2 tablets (1300 mg) at night as needed for pain  . albuterol (PROVENTIL) (2.5 MG/3ML) 0.083% nebulizer solution Take 2.5 mg by nebulization every 4 (four) hours as needed for wheezing or shortness of breath.   . ARIPiprazole (ABILIFY) 20 MG tablet Take 1 tablet (20 mg total) by mouth  at bedtime.  . ASPERCREME LIDOCAINE EX Apply 1 application topically 2 (two) times daily as needed (back pain).  [name] aspirin EC 81 MG EC tablet Take 1 tablet (81 mg total) by mouth daily.  [name] atorvastatin (LIPITOR) 20 MG tablet Take 20 mg by mouth daily after supper.  . bisacodyl (DULCOLAX) 10 MG suppository Place 1 suppository (10 mg total) rectally daily as needed for moderate constipation.  . brimonidine (ALPHAGAN) 0.2 % ophthalmic solution Place 1 drop into both eyes 2 (two) times daily.  . budesonide-formoterol (SYMBICORT) 160-4.5 MCG/ACT inhaler Inhale 2 puffs into the lungs 2 (two) times daily.  [name] donepezil (ARICEPT) 5 MG tablet Take 1 tablet (5 mg total) by mouth at bedtime.  . dorzolamide-timolol (COSOPT) 22.3-6.8 MG/ML ophthalmic solution Place 1 drop into both eyes 2 (two) times daily.  . ferrous sulfate 325 (65 FE) MG tablet Take 325 mg by mouth 2 (two) times daily with a meal.   . furosemide (LASIX) 80 MG tablet Take 1 tablet (80 mg total) by mouth daily. (Patient taking differently: Take 40 mg by mouth 2 (two) times daily. )  . glimepiride (AMARYL) 4 MG tablet Take 0.5 tablets (2 mg total) by mouth daily with breakfast.  . Ipratropium-Albuterol (COMBIVENT RESPIMAT) 20-100 MCG/ACT AERS respimat Inhale 1-2 puffs into the lungs  every 4 (four) hours as needed for wheezing.   . lamoTRIgine (LAMICTAL) 100 MG tablet Take 1 tablet (100 mg total) by mouth every evening. (Patient taking differently: Take 100 mg by mouth daily after supper. )  . latanoprost (XALATAN) 0.005 % ophthalmic solution Place 1 drop into both eyes at bedtime.  . Magnesium 250 MG TABS Take 250 mg by mouth See admin instructions. Take 1 tablet (250 mg) by mouth every other day after supper  . Menthol, Topical Analgesic, (BIOFREEZE EX) Apply 1 application topically 2 (two) times daily as needed (back pain).  . metFORMIN (GLUCOPHAGE) 1000 MG tablet Take 500 mg by mouth 2 (two) times daily with a meal.  . OXYGEN Inhale 8 L into  the lungs continuous.  [name] Phenylephrine-APAP-Guaifenesin (MUCINEX SINUS-MAX) 10-650-400 MG/20ML LIQD Take 20 mLs by mouth as needed (cough).   . polyethylene glycol (MIRALAX / GLYCOLAX) packet Take 17 g by mouth daily. (Patient taking differently: Take 17 g by mouth daily as needed for mild constipation, moderate constipation or severe constipation. Mix in 4-8 oz liquid and drink)  . PRESCRIPTION MEDICATION Inhale into the lungs 2 (two) times daily. Trilogy Machine  . Selenium Sulf-Pyrithione-Urea 2.25 % SHAM Apply 1 application topically See admin instructions. Apply topically to scalp for dermatitis once every 14 days  . senna (SENOKOT) 8.6 MG tablet Take 1 tablet by mouth daily as needed for constipation.   . sildenafil (REVATIO) 20 MG tablet Take 1 tablet (20 mg total) by mouth 3 (three) times daily.  [name] spironolactone (ALDACTONE) 25 MG tablet Take 25 mg by mouth daily at 12 noon. 12 pm     Allergies: Allergies as of 07/19/2019 - Review Complete 07/19/2019  Allergen Reaction Noted  . Codeine Nausea And Vomiting 08/14/2014  . Nitrostat [nitroglycerin] Other (See Comments) 07/02/2017  . Prednisone Other (See Comments) 08/14/2014   Past Medical History:  Diagnosis Date  . Acute respiratory distress 03/28/2019  . Acute respiratory failure ([place]) 06/09/2017  . Arthritis   . Back pain   . Bell's palsy   . Bipolar disorder ([place])   . Bronchitis   . Chronic low back pain 05/09/2015  . Cough with expectoration 05/21/16   recent diagnosis of bronchitis  . Cyst of right kidney   . Diabetes mellitus without complication ([place])    type 2  . Frequency of urination   . GERD (gastroesophageal reflux disease)   . Glaucoma   . History of blood transfusion   . History of colon polyps   . History of hiatal hernia   . History of tobacco use   . Hypercalcemia   . Hyperlipidemia   . Hypertension   . Memory disorder 09/05/2014  . On home oxygen therapy    3L/M  at night   . Schizo-affective  psychosis ([place])   . Shortness of breath dyspnea    exertion, or with out oxygen  . Uterine fibroid     Family History:  Family History  Problem Relation Age of Onset  . Dementia Mother   . Alzheimer's disease Mother   . Heart disease Mother   . Hypertension Mother   . Diabetes Mother   . Diabetes Father   . Alzheimer's disease Sister   . Heart disease Brother   . Heart attack Brother   . Bladder Cancer Neg Hx   . Kidney cancer Neg Hx   . Prostate cancer Neg Hx   Reviewed on admission  Social History:  Social History  Tobacco Use  . Smoking status: Former Smoker    Packs/day: 1.00    Years: 28.00    Pack years: 28.00    Types: Cigarettes    Quit date: 07/15/2015    Years since quitting: 4.0  . Smokeless tobacco: Never Used  Substance Use Topics  . Alcohol use: No    Alcohol/week: 0.0 standard drinks  . Drug use: No  Reviewed on admission  Review of Systems: A complete ROS was negative except as per HPI.  Physical Exam: Blood pressure 95/78, pulse 89, temperature 97.8 F (36.6 C), temperature source Oral, resp. rate 19, height _0  (1.6 m), weight 83.5 kg, SpO2 92 %. Physical Exam Constitutional:      General: She is not in acute distress.    Appearance: Normal appearance. She is obese.     Comments: Chronically ill appearing, mildly drowsy, elderly female  Cardiovascular:     Rate and Rhythm: Normal rate and regular rhythm.     Pulses: Normal pulses.     Heart sounds: Normal heart sounds.     Comments: Bilateral LE edema, 2-3+ to thigh Pulmonary:     Effort: Pulmonary effort is normal. No respiratory distress.     Breath sounds: Normal breath sounds.  Abdominal:     General: Bowel sounds are normal. There is no distension.     Palpations: Abdomen is soft.     Tenderness: There is no abdominal tenderness.  Musculoskeletal:        General: No swelling or deformity.     Right lower leg: Edema present.     Left lower leg: Edema present.  Skin:     General: Skin is warm and dry.  Neurological:     General: No focal deficit present.     Mental Status: Mental status is at baseline.     Comments: A little drowsy, but alert    EKG: personally reviewed my interpretation is sinus rhythm, low voltage   CXR: personally reviewed my interpretation is rotated, inspiration suboptimal, good penetration; cardiomegaly, and nevous congestion, similar to previous.  Assessment & Plan by Problem: Active Problems:   CHF exacerbation (HCC)  Acute on Chronic Heart Failure: Pulmonary HTN, likely mixed type II / III: Noted to be volume overloaded in ED with BNP 1197. Reports increased edema for the past 2 weeks or so. No worsening of SOB nor rales on exam. Last Echo in Jan showed LVEF 55-60%, G1DD, Moderately reduced RVEF, PA peak pressure of 22mHg. Suspect presentation exacerbation of right sided heart failure. Cardiologist, Dr. GEinar Gip Consulted in the ED. There is a telephone [facility] from cardiologist's [facility] and it was recommended she come to clinic. Her weight is stable per our records from 82kgs to 83kgs, but unclear if accurate. - Appreciate cardiology recommendations - S/P 631mIV Lasix in ED, will monitor response, likely 8035mV BID - Hold spironolactone given soft BP - Continue home Revatio - Strict I/O, Daily Weights, [place]  Hypervolemic Hyponatremia: Patient noted to have Na 127 in ED. Given significant volume overload and prior similar presentation; suspect etiology to be volume overload. - Monitor BMP and response to diuretic  AKI on CKD3-4: Cr 3 on admission up from baseline of ~2.3. In the setting of CHF exacerbation this may be due to volume overload and venous congestion. Will follow response to diuresis - Follow BMP  COPD: Chronic Hypoxic respiratory failure: Centrolobular emphysema on CT, GOLD stage  0 as recorded PFTs do not meet FEV1 or ratio criteria. Hx of wheezing believed  to be cadiac on COPD. Multiple recent admission for COPD / Resp Failure. Was on hospice but is not now. She does continue to follow with palliative care and is DNR. She is on 7-8L Chronically at home and takes Symbicort, Combivent PRN, and Albuterol Neb PRN. She does desaturate at home at times, but improved with repositioning and trilogy ventilator machine use. - Continue home Symbicort, Combivent, and Albuterol  - Supplemental oxygen, keep O2 >90% - Incentive spirometry, Flutter valve - Trilogy machine to be brought from home  Memory disorder, Bipolar, Schizoaffective - Continue home Aripiprazole, Donepezil, Lamotragine  Diabetes: metformin and glimepiride at home - SSI-M  Hypersomina with Apnea, OSA - CPAP qhs vs Trilogy machine  FEN: replete lytes prn, HH 1800cc (restict free H2O) VTE ppx: Lovenox Code Status: DNR   Dispo:  - DNR, on Palliative Care. Was previously on home hospice, but given her ability to continue to think clearly and residual functional capacity, she stopped following with hospice. Will need continued GOC discussion and palliative follow up.  - Spoke with niece (caregiver and HCPOA) who notes patient fell when going to new beside commode and was not able to get up. She states was considering taking her aunt to the hospital due to her volume overload even before the fall. She reports the edema has been worsening for about 1 month with acceleration of this in the past 2 weeks. She had cardiology appointment scheduled for the 14th but did not think they could wait this long. She will be bringing in patient's trilogy machine (she needs this when sleeping on her back as she will desat at times if she does not have it per neice). She confirms patient BP is typically in the 132G to 401U systolic. She states she will talk with Dr Einar Gip about possible HF consult.  - Admit patient to Inpatient with expected length of stay greater than 2 midnights.  Signed: Neva Seat,  MD 07/19/2019, 10:43 AM  Pager: [phone]

## 2019-07-19 NOTE — ED Notes (Signed)
Pt' CBG 49, given orange juice and cheese and crackers. Awaiting lunch tray.

## 2019-07-19 NOTE — ED Notes (Signed)
Patient is normally on 8 liters 02 at home and sats are in the 89-92 range per daughter . Patient is alert oriented x 4. Pitting edema to mid thigh.

## 2019-07-19 NOTE — ED Notes (Signed)
ED TO INPATIENT HANDOFF REPORT  ED Nurse Name and Phone #:  Myriam Jacobson RN [phone]   S Name/Age/Gender Elizabeth Thompson 68 y.o. female Room/Bed: 031C/031C  Code Status   Code Status: DNR  Home/SNF/Other Home Patient oriented to: self, place, time and situation Is this baseline? Yes   Triage Complete: Triage complete  Chief Complaint edema  Triage Note Patient presents to ed via [facility] ,states \the call came out as a fall, states she was going to the bathroom and was weak and fell. Denies injury from the fall. C/o increased swelling in legs since she started taking spironolactone several months ago. Patient currently has exp and insp. Wheezing.    Allergies Allergies  Allergen Reactions  . Codeine Nausea And Vomiting  . Nitrostat [Nitroglycerin] Other (See Comments)    Pt is on revatio  . Prednisone Other (See Comments)    Has to monitor because she is a diabetic     Level of Care/Admitting Diagnosis ED Disposition    ED Disposition Condition [place]: [place] [100100]  Level of Care: Progressive [102]  Covid Evaluation: Asymptomatic Screening Protocol (No Symptoms)  Diagnosis: CHF exacerbation [facility]) [016010]  Admitting Physician: Cresenciano Lick  Attending Physician: Cresenciano Lick  Estimated length of stay: past midnight tomorrow  Certification:: I certify this patient will need inpatient services for at least 2 midnights  PT Class (Do Not Modify): Inpatient [101]  PT Acc Code (Do Not Modify): Private [1]       B Medical/Surgery History Past Medical History:  Diagnosis Date  . Acute respiratory distress 03/28/2019  . Acute respiratory failure ([place]) 06/09/2017  . Arthritis   . Back pain   . Bell's palsy   . Bipolar disorder ([place])   . Bronchitis   . Chronic low back pain 05/09/2015  . Cough with expectoration 05/21/16   recent diagnosis of bronchitis  . Cyst of right kidney   . Diabetes mellitus  without complication ([place])    type 2  . Frequency of urination   . GERD (gastroesophageal reflux disease)   . Glaucoma   . History of blood transfusion   . History of colon polyps   . History of hiatal hernia   . History of tobacco use   . Hypercalcemia   . Hyperlipidemia   . Hypertension   . Memory disorder 09/05/2014  . On home oxygen therapy    3L/M [place] at night   . Schizo-affective psychosis ([place])   . Shortness of breath dyspnea    exertion, or with out oxygen  . Uterine fibroid    Past Surgical History:  Procedure Laterality Date  . ABDOMINAL HYSTERECTOMY    . APPENDECTOMY    . COLONOSCOPY W/ POLYPECTOMY    . IR GENERIC HISTORICAL  04/29/2016   IR RADIOLOGIST EVAL & MGMT 04/29/2016 Corrie Mckusick, DO [facility] INTERV RAD  . IR GENERIC HISTORICAL  10/01/2016   IR RADIOLOGIST EVAL & MGMT 10/01/2016 [facility] INTERV RAD  . IR GENERIC HISTORICAL  11/11/2016   IR RADIOLOGIST EVAL & MGMT 11/11/2016 Corrie Mckusick, DO [facility] INTERV RAD  . IR GENERIC HISTORICAL  07/16/2016   IR RADIOLOGIST EVAL & MGMT 07/16/2016 Corrie Mckusick, DO [facility] INTERV RAD  . IR RADIOLOGIST EVAL & MGMT  03/10/2017  . IR RADIOLOGIST EVAL & MGMT  05/13/2017  . IR RADIOLOGIST EVAL & MGMT  04/27/2018  . IR RADIOLOGIST EVAL & MGMT  07/13/2019  .  LUMBAR LAMINECTOMY/DECOMPRESSION MICRODISCECTOMY N/A 08/14/2015   Procedure: LUMBAR DECOMPRESSION MICRODISCECTOMY L3-S1;  Surgeon: Melina Schools, MD;  Location: [place];  Service: Orthopedics;  Laterality: N/A;  . RADIOFREQUENCY ABLATION Left 02/05/2017   Procedure: LEFT RENAL CRYOABLATION;  Surgeon: Corrie Mckusick, DO;  Location: [facility];  Service: Anesthesiology;  Laterality: Left;  . RIGHT HEART CATH N/A 07/20/2017   Procedure: Right Heart Cath;  Surgeon: Nigel Mormon, MD;  Location: [place] [facility];  Service: Cardiovascular;  Laterality: N/A;  . RIGHT/LEFT HEART CATH AND CORONARY ANGIOGRAPHY N/A 06/11/2017   Procedure: Right/Left Heart Cath and Coronary Angiography;  Surgeon:  Dixie Dials, MD;  Location: [place] [facility];  Service: Cardiovascular;  Laterality: N/A;  . TONSILLECTOMY AND ADENOIDECTOMY       A IV Location/Drains/Wounds Patient Lines/Drains/Airways Status   Active Line/Drains/Airways    Name:   Placement date:   Placement time:   Site:   Days:   Peripheral IV 04/02/19 Right;Posterior;Medial Forearm   04/02/19    0202    Forearm   108   Peripheral IV 07/19/19 Right Forearm   07/19/19    0631    Forearm   less than 1   External Urinary Catheter   03/27/19    1740    -   114          Intake/Output Last 24 hours No intake or output data in the 24 hours ending 07/19/19 1356  Labs/Imaging Results for orders placed or performed during the hospital encounter of 07/19/19 (from the past 48 hour(s))  Basic metabolic panel     Status: Abnormal   Collection Time: 07/19/19  6:57 AM  Result Value Ref Range   Sodium 127 (L) 135 - 145 mmol/L   Potassium 4.0 3.5 - 5.1 mmol/L   Chloride 92 (L) 98 - 111 mmol/L   CO2 22 22 - 32 mmol/L   Glucose, Bld 78 70 - 99 mg/dL   BUN 54 (H) 8 - 23 mg/dL   Creatinine, Ser 3.07 (H) 0.44 - 1.00 mg/dL   Calcium 9.8 8.9 - 10.3 mg/dL   GFR calc non Af Amer 15 (L) >60 mL/min   GFR calc Af Amer 17 (L) >60 mL/min   Anion gap 13 5 - 15    Comment: Performed at [facility], [address], [place], [place] [address]  CBC     Status: Abnormal   Collection Time: 07/19/19  6:57 AM  Result Value Ref Range   WBC 4.2 4.0 - 10.5 K/uL   RBC 5.71 (H) 3.87 - 5.11 MIL/uL   Hemoglobin 14.5 12.0 - 15.0 g/dL   HCT 46.7 (H) 36.0 - 46.0 %   MCV 81.8 80.0 - 100.0 fL   MCH 25.4 (L) 26.0 - 34.0 pg   MCHC 31.0 30.0 - 36.0 g/dL   RDW 16.2 (H) 11.5 - 15.5 %   Platelets 406 (H) 150 - 400 K/uL   nRBC 0.0 0.0 - 0.2 %    Comment: Performed at [facility], [place] [address] [place], [place] [address]  Brain natriuretic peptide     Status: Abnormal   Collection Time: 07/19/19  6:57 AM  Result Value Ref Range   B Natriuretic  Peptide 1,147.6 (H) 0.0 - 100.0 pg/mL    Comment: Performed at [facility] [address] [place], [place] [address]  Hepatic function panel     Status: Abnormal   Collection Time: 07/19/19  7:46 AM  Result Value Ref Range   Total Protein 6.2 (L) 6.5 - 8.1 g/dL   Albumin 3.3 (L) 3.5 - 5.0 g/dL   AST 39 15 - 41 U/L   ALT 22 0 - 44 U/L   Alkaline Phosphatase 77 38 - 126 U/L   Total Bilirubin 1.2 0.3 - 1.2 mg/dL   Bilirubin, Direct 0.4 (H) 0.0 - 0.2 mg/dL   Indirect Bilirubin 0.8 0.3 - 0.9 mg/dL    Comment: Performed at [facility] [address], [place], [place] [address]  Troponin I (High Sensitivity)     Status: Abnormal   Collection Time: 07/19/19  7:46 AM  Result Value Ref Range   Troponin I (High Sensitivity) 92 (H) <18 ng/L    Comment: (NOTE) Elevated high sensitivity troponin I (hsTnI) values and significant  changes across serial measurements may suggest ACS but many other  chronic and acute conditions are known to elevate hsTnI results.  Refer to the "Links" section for chest pain algorithms and additional  guidance. Performed at [facility], [place] [address] [place], [place] [address]   SARS Coronavirus 2 [facility] order, Performed in [facility] hospital lab) Nasopharyngeal Nasopharyngeal Swab     Status: None   Collection Time: 07/19/19 10:10 AM   Specimen: Nasopharyngeal Swab  Result Value Ref Range   SARS Coronavirus 2 NEGATIVE NEGATIVE    Comment: (NOTE) If result is NEGATIVE SARS-CoV-2 target nucleic acids are NOT DETECTED. The SARS-CoV-2 RNA is generally detectable in upper and lower  respiratory specimens during the acute phase of infection. The lowest  concentration of SARS-CoV-2 viral copies this assay can detect is 250  copies / mL. A negative result does not preclude SARS-CoV-2 infection  and should not be used as the sole basis for treatment or other  patient management decisions.  A negative result may occur with  improper  specimen collection / handling, submission of specimen other  than nasopharyngeal swab, presence of viral mutation(s) within the  areas targeted by this assay, and inadequate number of viral copies  (<250 copies / mL). A negative result must be combined with clinical  observations, patient history, and epidemiological information. If result is POSITIVE SARS-CoV-2 target nucleic acids are DETECTED. The SARS-CoV-2 RNA is generally detectable in upper and lower  respiratory specimens dur ing the acute phase of infection.  Positive  results are indicative of active infection with SARS-CoV-2.  Clinical  correlation with patient history and other diagnostic information is  necessary to determine patient infection status.  Positive results do  not rule out bacterial infection or co-infection with other viruses. If result is PRESUMPTIVE POSTIVE SARS-CoV-2 nucleic acids MAY BE PRESENT.   A presumptive positive result was obtained on the submitted specimen  and confirmed on repeat testing.  While 2019 novel coronavirus  (SARS-CoV-2) nucleic acids may be present in the submitted sample  additional confirmatory testing may be necessary for epidemiological  and / or clinical management purposes  to differentiate between  SARS-CoV-2 and other Sarbecovirus currently known to infect humans.  If clinically indicated additional testing with an alternate test  methodology [phone]) is advised. The SARS-CoV-2 RNA is generally  detectable in upper and lower respiratory sp ecimens during the acute  phase of infection. The expected result is Negative. Fact Sheet for Patients:  StrictlyIdeas.no Fact Sheet for Healthcare Providers: BankingDealers.co.za This test is not yet approved or cleared by the [place] FDA and has been authorized for detection and/or diagnosis of  SARS-CoV-2 by FDA under an Emergency Use Authorization (EUA).  This EUA will remain in  effect (meaning this test can be used) for the duration of the COVID-19 declaration under Section 564(b)(1) of the Act, 21 U.S.C. section 360bbb-3(b)(1), unless the authorization is terminated or revoked sooner. Performed at [facility], [place] [address], [place], [place] [address]   Troponin I (High Sensitivity)     Status: Abnormal   Collection Time: 07/19/19 10:36 AM  Result Value Ref Range   Troponin I (High Sensitivity) 189 (HH) <18 ng/L    Comment: CRITICAL RESULT CALLED TO, READ BACK BY AND VERIFIED WITH: A GORTON RN [phone] BY A BENNETT (NOTE) Elevated high sensitivity troponin I (hsTnI) values and significant  changes across serial measurements may suggest ACS but many other  chronic and acute conditions are known to elevate hsTnI results.  Refer to the Links section for chest pain algorithms and additional  guidance. Performed at [facility], [place] [address] [place], [place] [address]   CBC     Status: Abnormal   Collection Time: 07/19/19 10:36 AM  Result Value Ref Range   WBC 4.1 4.0 - 10.5 K/uL   RBC 5.86 (H) 3.87 - 5.11 MIL/uL   Hemoglobin 14.8 12.0 - 15.0 g/dL   HCT 48.8 (H) 36.0 - 46.0 %   MCV 83.3 80.0 - 100.0 fL   MCH 25.3 (L) 26.0 - 34.0 pg   MCHC 30.3 30.0 - 36.0 g/dL   RDW 16.3 (H) 11.5 - 15.5 %   Platelets 280 150 - 400 K/uL   nRBC 0.0 0.0 - 0.2 %    Comment: Performed at [facility], [place] [address] [place], [place] [address]  Creatinine, serum     Status: Abnormal   Collection Time: 07/19/19 10:36 AM  Result Value Ref Range   Creatinine, Ser 2.89 (H) 0.44 - 1.00 mg/dL   GFR calc non Af Amer 16 (L) >60 mL/min   GFR calc Af Amer 19 (L) >60 mL/min    Comment: Performed at [facility] [address], [place], [place] [address]  Magnesium     Status: Abnormal   Collection Time: 07/19/19 10:36 AM  Result Value Ref Range   Magnesium 2.5 (H) 1.7 - 2.4 mg/dL    Comment: Performed at [facility] [address]  [place],  [address]  CBG monitoring, ED     Status: Abnormal   Collection Time: 07/19/19 12:36 PM  Result Value Ref Range   Glucose-Capillary 34 (LL) 70 - 99 mg/dL   Comment 1 Call MD NNP PA CNM   CBG monitoring, ED     Status: Abnormal   Collection Time: 07/19/19  1:05 PM  Result Value Ref Range   Glucose-Capillary 49 (L) 70 - 99 mg/dL   Dg Chest Portable 1 View  Result Date: 07/19/2019 CLINICAL DATA:  Shortness of breath EXAM: PORTABLE CHEST 1 VIEW COMPARISON:  03/29/2019 FINDINGS: Chronic cardiomegaly and interstitial coarsening. Negative mediastinal contours when accounting for leftward rotation. There is no edema, consolidation, effusion, or pneumothorax. IMPRESSION: No acute finding when compared to prior. Chronic interstitial disease and cardiomegaly. Electronically Signed   By: Monte Fantasia M.D.   On: 07/19/2019 07:05    Pending Labs Unresulted Labs (From admission, onward)    Start     Ordered   07/26/19 0500  Creatinine, serum  (enoxaparin (LOVENOX)    CrCl < 30 ml/min)  Weekly,  R    Comments: while on enoxaparin therapy.    07/19/19 1011   07/20/19 7902  Basic metabolic panel  Tomorrow morning,   R     07/19/19 1011          Vitals/Pain Today's Vitals   07/19/19 1100 07/19/19 1130 07/19/19 1200 07/19/19 1245  BP: (!) 115/51 119/66 107/71 105/72  Pulse:    99  Resp: 19 18 (!) 21 16  Temp:      TempSrc:      SpO2:    94%  Weight:      Height:      PainSc:        Isolation Precautions No active isolations  Medications Medications  enoxaparin (LOVENOX) injection 30 mg (has no administration in time range)  sodium chloride flush (NS) 0.9 % injection 3 mL (3 mLs Intravenous Given 07/19/19 1308)  acetaminophen (TYLENOL) tablet 650 mg (has no administration in time range)    Or  acetaminophen (TYLENOL) suppository 650 mg (has no administration in time range)  polyethylene glycol (MIRALAX / GLYCOLAX) packet 17 g (has no administration in time range)   atorvastatin (LIPITOR) tablet 20 mg (has no administration in time range)  sildenafil (REVATIO) tablet 20 mg (0 mg Oral Hold 07/19/19 1230)  ARIPiprazole (ABILIFY) tablet 20 mg (has no administration in time range)  donepezil (ARICEPT) tablet 5 mg (has no administration in time range)  ferrous sulfate tablet 325 mg (has no administration in time range)  lamoTRIgine (LAMICTAL) tablet 100 mg (has no administration in time range)  mometasone-formoterol (DULERA) 200-5 MCG/ACT inhaler 2 puff (2 puffs Inhalation Not Given 07/19/19 1117)  guaiFENesin (MUCINEX) 12 hr tablet 600 mg (has no administration in time range)  albuterol (PROVENTIL) (2.5 MG/3ML) 0.083% nebulizer solution 2.5 mg (has no administration in time range)  brimonidine (ALPHAGAN) 0.2 % ophthalmic solution 1 drop (has no administration in time range)  dorzolamide-timolol (COSOPT) 22.3-6.8 MG/ML ophthalmic solution 1 drop (has no administration in time range)  latanoprost (XALATAN) 0.005 % ophthalmic solution 1 drop (has no administration in time range)  insulin aspart (novoLOG) injection 0-9 Units (has no administration in time range)  aspirin EC tablet 81 mg (has no administration in time range)  ipratropium-albuterol (DUONEB) 0.5-2.5 (3) MG/3ML nebulizer solution 3 mL (has no administration in time range)  sodium chloride flush (NS) 0.9 % injection 3 mL (3 mLs Intravenous Given 07/19/19 0807)  furosemide (LASIX) injection 60 mg (60 mg Intravenous Given 07/19/19 0803)    Mobility walks with device High fall risk   Focused Assessments Pulmonary Assessment Handoff:  Lung sounds: L Breath Sounds: Expiratory wheezes, Inspiratory wheezes R Breath Sounds: Inspiratory wheezes, Expiratory wheezes O2 Device: Nasal Cannula O2 Flow Rate (L/min): 8 L/min      R Recommendations: See Admitting Provider Note  Report given to:   Additional Notes:  [name] is at bedside, cares for her at home

## 2019-07-19 NOTE — ED Notes (Signed)
Pt given apple juice

## 2019-07-19 NOTE — ED Notes (Signed)
Pt has not had much output but no visible evidence that Purewick is not working

## 2019-07-19 NOTE — Consult Note (Signed)
CARDIOLOGY CONSULT NOTE  Patient ID: Elizabeth Thompson MRN: [id number] DOB/AGE: 1951-01-07 68 y.o.  Admit date: 07/19/2019 Referring Physician  Teaching Service, IM Primary Physician:  Everardo Beals, NP Reason for Consultation  CHF  HPI: Elizabeth ZAVALETA  is a 68 y.o. female  With WHO group 2 and 3 PAH. She has h/o left superior pole renal cell carcinoma cryoablation on July 2017 with second ablation on a new lesion on 10/01/2016. Prior tobacco use disorder (40 pack year quit 2017) with severe COPD, uses home oxygen, diabetes mellitus, hyperlipidemia, hypertension, GERD, PH treated with Sildenafil.   Brought in by her Niece for failure to thrive and worsening leg edema. Fell at home. I had sent messages stating that she needs to come and see me in the office multiple times, patient unable to go to the doctor's office visit due to her debility.  She denies chest pain, states that her dyspnea is stable, no change in her bowel bowels, no melena. Past Medical History:  Diagnosis Date  . Acute respiratory distress 03/28/2019  . Acute respiratory failure ([place]) 06/09/2017  . Arthritis   . Back pain   . Bell's palsy   . Bipolar disorder ([place])   . Bronchitis   . Chronic low back pain 05/09/2015  . Cough with expectoration 05/21/16   recent diagnosis of bronchitis  . Cyst of right kidney   . Diabetes mellitus without complication ([place])    type 2  . Frequency of urination   . GERD (gastroesophageal reflux disease)   . Glaucoma   . History of blood transfusion   . History of colon polyps   . History of hiatal hernia   . History of tobacco use   . Hypercalcemia   . Hyperlipidemia   . Hypertension   . Memory disorder 09/05/2014  . On home oxygen therapy    3L/M [place] at night   . Schizo-affective psychosis ([place])   . Shortness of breath dyspnea    exertion, or with out oxygen  . Uterine fibroid      Past Surgical History:  Procedure Laterality Date  . ABDOMINAL HYSTERECTOMY    .  APPENDECTOMY    . COLONOSCOPY W/ POLYPECTOMY    . IR GENERIC HISTORICAL  04/29/2016   IR RADIOLOGIST EVAL & MGMT 04/29/2016 Corrie Mckusick, DO [facility]  . IR GENERIC HISTORICAL  10/01/2016   IR RADIOLOGIST EVAL & MGMT 10/01/2016 [facility]  . IR GENERIC HISTORICAL  11/11/2016   IR RADIOLOGIST EVAL & MGMT 11/11/2016 Corrie Mckusick, DO [facility]  . IR GENERIC HISTORICAL  07/16/2016   IR RADIOLOGIST EVAL & MGMT 07/16/2016 Corrie Mckusick, DO [facility]  . IR RADIOLOGIST EVAL & MGMT  03/10/2017  . IR RADIOLOGIST EVAL & MGMT  05/13/2017  . IR RADIOLOGIST EVAL & MGMT  04/27/2018  . IR RADIOLOGIST EVAL & MGMT  07/13/2019  . LUMBAR LAMINECTOMY/DECOMPRESSION MICRODISCECTOMY N/A 08/14/2015   Procedure: LUMBAR DECOMPRESSION MICRODISCECTOMY L3-S1;  Surgeon: Melina Schools, MD;  Location: [facility];  Service: Orthopedics;  Laterality: N/A;  . RADIOFREQUENCY ABLATION Left 02/05/2017   Procedure: LEFT RENAL CRYOABLATION;  Surgeon: Corrie Mckusick, DO;  Location: [facility];  Service: Anesthesiology;  Laterality: Left;  . RIGHT HEART CATH N/A 07/20/2017   Procedure: Right Heart Cath;  Surgeon: Nigel Mormon, MD;  Location: [facility];  Service: Cardiovascular;  Laterality: N/A;  . RIGHT/LEFT HEART CATH AND CORONARY ANGIOGRAPHY N/A 06/11/2017   Procedure: Right/Left Heart  Cath and Coronary Angiography;  Surgeon: Dixie Dials, MD;  Location: [facility];  Service: Cardiovascular;  Laterality: N/A;  . TONSILLECTOMY AND ADENOIDECTOMY       Family History  Problem Relation Age of Onset  . Dementia Mother   . Alzheimer's disease Mother   . Heart disease Mother   . Hypertension Mother   . Diabetes Mother   . Diabetes Father   . Alzheimer's disease Sister   . Heart disease Brother   . Heart attack Brother   . Bladder Cancer Neg Hx   . Kidney cancer Neg Hx   . Prostate cancer Neg Hx      Social History: Social History   Socioeconomic History  . Marital status: Divorced     Spouse name: Not on file  . Number of children: 1  . Years of education: college 4  . Highest education level: Not on file  Occupational History  . Occupation: disabled  Social Needs  . Financial resource strain: Not on file  . Food insecurity    Worry: Not on file    Inability: Not on file  . Transportation needs    Medical: Not on file    Non-medical: Not on file  Tobacco Use  . Smoking status: Former Smoker    Packs/day: 1.00    Years: 28.00    Pack years: 28.00    Types: Cigarettes    Quit date: 07/15/2015    Years since quitting: 4.0  . Smokeless tobacco: Never Used  Substance and Sexual Activity  . Alcohol use: No    Alcohol/week: 0.0 standard drinks  . Drug use: No  . Sexual activity: Not Currently    Birth control/protection: None  Lifestyle  . Physical activity    Days per week: Not on file    Minutes per session: Not on file  . Stress: Not on file  Relationships  . Social Herbalist on phone: Not on file    Gets together: Not on file    Attends religious service: Not on file    Active member of club or organization: Not on file    Attends meetings of clubs or organizations: Not on file    Relationship status: Not on file  . Intimate partner violence    Fear of current or ex partner: Not on file    Emotionally abused: Not on file    Physically abused: Not on file    Forced sexual activity: Not on file  Other Topics Concern  . Not on file  Social History Narrative   Patient is right handed.   Patient drinks 2 cups caffeine daily.     Medications Prior to Admission  Medication Sig Dispense Refill Last Dose  . acetaminophen (TYLENOL) 650 MG CR tablet Take 1,300 mg by mouth See admin instructions. Take 2 tablets (1300 mg) by mouth every morning, make take 2 tablets (1300 mg) at night as needed for pain   07/18/2019 at prn  . albuterol (PROVENTIL) (2.5 MG/3ML) 0.083% nebulizer solution Take 2.5 mg by nebulization every 4 (four) hours as needed for  wheezing or shortness of breath.    07/18/2019 at prn  . ARIPiprazole (ABILIFY) 20 MG tablet Take 1 tablet (20 mg total) by mouth at bedtime. 30 tablet 0 07/18/2019 at Unknown time  . ASPERCREME LIDOCAINE EX Apply 1 application topically 2 (two) times daily as needed (back pain).   Past Month at prn  . aspirin EC 81  MG EC tablet Take 1 tablet (81 mg total) by mouth daily. 30 tablet 0 07/18/2019 at Unknown time  . atorvastatin (LIPITOR) 20 MG tablet Take 20 mg by mouth daily after supper.   07/18/2019 at Unknown time  . bisacodyl (DULCOLAX) 10 MG suppository Place 1 suppository (10 mg total) rectally daily as needed for moderate constipation. 12 suppository 0 unknown at prn  . brimonidine (ALPHAGAN) 0.2 % ophthalmic solution Place 1 drop into both eyes 2 (two) times daily.   07/18/2019 at Unknown time  . budesonide-formoterol (SYMBICORT) 160-4.5 MCG/ACT inhaler Inhale 2 puffs into the lungs 2 (two) times daily. 1 Inhaler 0 07/18/2019 at Unknown time  . donepezil (ARICEPT) 5 MG tablet Take 1 tablet (5 mg total) by mouth at bedtime. 30 tablet 0 07/18/2019 at Unknown time  . dorzolamide-timolol (COSOPT) 22.3-6.8 MG/ML ophthalmic solution Place 1 drop into both eyes 2 (two) times daily.   07/18/2019 at Unknown time  . ferrous sulfate 325 (65 FE) MG tablet Take 325 mg by mouth 2 (two) times daily with a meal.    07/18/2019 at Unknown time  . furosemide (LASIX) 80 MG tablet Take 1 tablet (80 mg total) by mouth daily. (Patient taking differently: Take 40 mg by mouth 2 (two) times daily. ) 30 tablet 0 07/18/2019 at Unknown time  . glimepiride (AMARYL) 4 MG tablet Take 0.5 tablets (2 mg total) by mouth daily with breakfast. 30 tablet 0 07/18/2019 at Unknown time  . Ipratropium-Albuterol (COMBIVENT RESPIMAT) 20-100 MCG/ACT AERS respimat Inhale 1-2 puffs into the lungs every 4 (four) hours as needed for wheezing.    07/18/2019 at prn  . lamoTRIgine (LAMICTAL) 100 MG tablet Take 1 tablet (100 mg total) by mouth every evening. (Patient  taking differently: Take 100 mg by mouth daily after supper. ) 30 tablet 0 07/18/2019 at Unknown time  . latanoprost (XALATAN) 0.005 % ophthalmic solution Place 1 drop into both eyes at bedtime. 2.5 mL 12 07/18/2019 at Unknown time  . Magnesium 250 MG TABS Take 250 mg by mouth See admin instructions. Take 1 tablet (250 mg) by mouth every other day after supper   07/18/2019 at Unknown time  . Menthol, Topical Analgesic, (BIOFREEZE EX) Apply 1 application topically 2 (two) times daily as needed (back pain).   unknown at prn  . metFORMIN (GLUCOPHAGE) 1000 MG tablet Take 500 mg by mouth 2 (two) times daily with a meal.   07/18/2019 at Unknown time  . OXYGEN Inhale 8 L into the lungs continuous.   07/18/2019 at Unknown time  . Phenylephrine-APAP-Guaifenesin (MUCINEX SINUS-MAX) 10-650-400 MG/20ML LIQD Take 20 mLs by mouth as needed (cough).    07/18/2019 at Unknown time  . polyethylene glycol (MIRALAX / GLYCOLAX) packet Take 17 g by mouth daily. (Patient taking differently: Take 17 g by mouth daily as needed for mild constipation, moderate constipation or severe constipation. Mix in 4-8 oz liquid and drink) 14 each 0 Past Week at prn  . PRESCRIPTION MEDICATION Inhale into the lungs 2 (two) times daily. Trilogy Machine   07/18/2019 at Unknown time  . Selenium Sulf-Pyrithione-Urea 2.25 % SHAM Apply 1 application topically See admin instructions. Apply topically to scalp for dermatitis once every 14 days  3 Past Month at Unknown time  . senna (SENOKOT) 8.6 MG tablet Take 1 tablet by mouth daily as needed for constipation.    unknown at prn  . sildenafil (REVATIO) 20 MG tablet Take 1 tablet (20 mg total) by mouth 3 (three) times daily.  90 tablet 0 07/18/2019 at Unknown time  . spironolactone (ALDACTONE) 25 MG tablet Take 25 mg by mouth daily at 12 noon. 12 pm   07/18/2019 at Unknown time  . glucose blood (ACCU-CHEK AVIVA) test strip 1 each by Other route 2 (two) times daily. 200 each 5   . Respiratory Therapy Supplies (FLUTTER)  DEVI Use as directed. 1 each 0     Review of Systems  Constitutional: Positive for malaise/fatigue. Negative for weight loss.  Respiratory: Positive for shortness of breath and wheezing. Negative for cough, hemoptysis and sputum production.   Cardiovascular: Positive for leg swelling. Negative for chest pain, palpitations and claudication.  Gastrointestinal: Negative for abdominal pain, blood in stool, constipation, heartburn and vomiting.  Genitourinary: Negative for dysuria.  Musculoskeletal: Positive for joint pain. Negative for myalgias.  Neurological: Negative for dizziness, focal weakness and headaches.  Endo/Heme/Allergies: Does not bruise/bleed easily.  Psychiatric/Behavioral: Positive for depression. The patient is nervous/anxious.   All other systems reviewed and are negative.  Physical Exam: Blood pressure 95/78, pulse 89, temperature 97.8 F (36.6 C), temperature source Oral, resp. rate 19, height _0  (1.6 m), weight 83.5 kg, SpO2 92 %.  Physical Exam  Constitutional: She is oriented to person, place, and time. She is easily aroused. She appears distressed (mild respiratory distress. Appears chronically sick).  Moderately obese, appears older than stated age  HENT:  Head: Atraumatic.  Eyes: Conjunctivae are normal.  Neck: Neck supple. JVD (to the angle of the jaw) present. No thyromegaly present.  Short neck and difficult to evaluate JVP  Cardiovascular: Normal rate, regular rhythm and intact distal pulses. Exam reveals distant heart sounds. Exam reveals no gallop.  No murmur heard. Pulses:      Carotid pulses are 2+ on the right side and 2+ on the left side.      Dorsalis pedis pulses are 2+ on the right side and 2+ on the left side.       Posterior tibial pulses are 2+ on the right side and 2+ on the left side.  Femoral and popliteal pulse difficult to feel due to patient's body habitus.   2-3 + Pitting edema  Pulmonary/Chest: She is in respiratory distress (mild  and chronic). She has no wheezes. She has no rales.  Abdominal: Soft. Bowel sounds are normal.  Obese. Pannus present. ? Hepatomegaly  Musculoskeletal: Normal range of motion.  Neurological: She is alert, oriented to person, place, and time and easily aroused.  Skin: Skin is warm and dry.  Psychiatric: She has a normal mood and affect.   Labs:  BNP (last 3 results) Recent Labs    02/26/19 0632 03/27/19 1531 07/19/19 0657  BNP 547.8* 593.2* 1,147.6*    CMP Latest Ref Rng & Units 07/19/2019 06/23/2019 04/03/2019  Glucose 70 - 99 mg/dL 78 - 136(H)  BUN 8 - 23 mg/dL 54(H) - 58(H)  Creatinine 0.44 - 1.00 mg/dL 3.07(H) 3.50(H) 2.31(H)  Sodium 135 - 145 mmol/L 127(L) - 136  Potassium 3.5 - 5.1 mmol/L 4.0 - 3.4(L)  Chloride 98 - 111 mmol/L 92(L) - 96(L)  CO2 22 - 32 mmol/L 22 - 25  Calcium 8.9 - 10.3 mg/dL 9.8 - 9.7  Total Protein 6.5 - 8.1 g/dL 6.2(L) - -  Total Bilirubin 0.3 - 1.2 mg/dL 1.2 - -  Alkaline Phos 38 - 126 U/L 77 - -  AST 15 - 41 U/L 39 - -  ALT 0 - 44 U/L 22 - -   CBC Latest  Ref Rng & Units 07/19/2019 04/01/2019 03/31/2019  WBC 4.0 - 10.5 K/uL 4.2 10.1 9.5  Hemoglobin 12.0 - 15.0 g/dL 14.5 13.8 12.9  Hematocrit 36.0 - 46.0 % 46.7(H) 45.5 42.1  Platelets 150 - 400 K/uL 406(H) 570(H) 560(H)    Lipid Panel     Component Value Date/Time   CHOL 166 06/13/2017 0728   TRIG 72 06/13/2017 0728   HDL 61 06/13/2017 0728   CHOLHDL 2.7 06/13/2017 0728   VLDL 14 06/13/2017 0728   LDLCALC 91 06/13/2017 0728   LDLDIRECT 142.0 03/19/2016 1129   BNP (last 3 results) Recent Labs    02/26/19 0632 03/27/19 1531 07/19/19 0657  BNP 547.8* 593.2* 1,147.6*   HEMOGLOBIN A1C Lab Results  Component Value Date   HGBA1C 6.5 (H) 02/24/2019   MPG 139.85 02/24/2019   BMP Latest Ref Rng & Units 07/19/2019 06/23/2019 04/03/2019  Glucose 70 - 99 mg/dL 78 - 136(H)  BUN 8 - 23 mg/dL 54(H) - 58(H)  Creatinine 0.44 - 1.00 mg/dL 3.07(H) 3.50(H) 2.31(H)  BUN/Creat Ratio 11 - 26 - - -  Sodium  135 - 145 mmol/L 127(L) - 136  Potassium 3.5 - 5.1 mmol/L 4.0 - 3.4(L)  Chloride 98 - 111 mmol/L 92(L) - 96(L)  CO2 22 - 32 mmol/L 22 - 25  Calcium 8.9 - 10.3 mg/dL 9.8 - 9.7   TSH Recent Labs    02/26/19 1453  TSH 0.308*   Radiology: Dg Chest Portable 1 View  Result Date: 07/19/2019 CLINICAL DATA:  Shortness of breath EXAM: PORTABLE CHEST 1 VIEW COMPARISON:  03/29/2019 FINDINGS: Chronic cardiomegaly and interstitial coarsening. Negative mediastinal contours when accounting for leftward rotation. There is no edema, consolidation, effusion, or pneumothorax. IMPRESSION: No acute finding when compared to prior. Chronic interstitial disease and cardiomegaly. Electronically Signed   By: Monte Fantasia M.D.   On: 07/19/2019 07:05   CARDIAC STUDIES:  Echocardiogram 01/07/2019: - Left ventricle: The cavity size was normal. Wall thickness was  increased in a pattern of mild LVH. Systolic function was normal.  The estimated ejection fraction was in the range of 55% to 60%.  Wall motion was normal; there were no regional wall motion  abnormalities. Doppler parameters are consistent with abnormal  left ventricular relaxation (grade 1 diastolic dysfunction).  Doppler parameters are consistent with high ventricular filling  pressure. - Right ventricle: Systolic function was moderately reduced.  ASSESSMENT AND PLAN:  1.  Acute cor pulmonale secondary to underlying severe COPD  2.  Acute on chronic diastolic heart failure, LV 3.  Acute on chronic systolic heart failure, RV secondary to #1. 4.  Anasarca secondary to #1 and 2 5.  Severe pulmonary hypertension secondary to group 2 and 3 WHO classification.   EKG 07/19/2019: Sinus rhythm with first-degree block at rate of 90 bpm, right axis deviation, RVH, poor R wave progression, low voltage complexes, pulmonary disease pattern.  No evidence of ischemia. No significant change from  EKG 02/24/2019   Recommendation: Patient is chronically ill looking  and has stage IV chronic kidney disease, acute on chronic cor pulmonale, severe pulmonary hypertension, underlying severe COPD and not much to offer from cardiac standpoint and from overall medical standpoint and she is very appropriate candidate for being in hospice.  She was in hospice, but in the interim due to COVID-19 there is been some miscommunication.  She needs IV diuresis and fortunately is responding to IV diuretics.  Suspect she will improve with IV diuresis and she can be discharged  back home with home hospice care who can also administer IV furosemide if necessary. Fortunately she has started diuresis with IV lasix.  Add  Metolazone and increase lasix to 80 mg TID IV.   Patient is so chronically ill and patient's niece who I called but could not reach her, is aware and has been involved in her care, I will try to convince her that her best option is conservative therapy and comfort measures.  I will continue to follow the patient with you. Continue sildenafil for pulmonary hypertension and RV failure.  Not a candidate for BiDil due to use of revatio and no ACEi or ARB due to renal failure. Discontinue Atorvastatin as it does not affect short term mortality and poor patient overall health status.  If IV diuresis is not effective by tomorrow, we could try IV dobutamine in view of biventricular failure. BP is soft and not a candidate for milrinone in view of this and renal failure. I d/w her that referral to HF team does not mean they will come to see her at home. She has had difficulty mobilizing and coming to our office.   Adrian Prows, MD, [facility] 07/19/2019, 9:38 AM [facility]. [place] Pager: [phone] Office: [phone] If no answer Cell [phone]

## 2019-07-19 NOTE — ED Notes (Signed)
Unable to draw troponin off IV, will find phlebotomy to draw

## 2019-07-20 ENCOUNTER — Inpatient Hospital Stay (HOSPITAL_COMMUNITY): Payer: Medicare Other

## 2019-07-20 DIAGNOSIS — Z9981 Dependence on supplemental oxygen: Secondary | ICD-10-CM

## 2019-07-20 DIAGNOSIS — R609 Edema, unspecified: Secondary | ICD-10-CM

## 2019-07-20 DIAGNOSIS — I272 Pulmonary hypertension, unspecified: Secondary | ICD-10-CM

## 2019-07-20 DIAGNOSIS — R52 Pain, unspecified: Secondary | ICD-10-CM

## 2019-07-20 LAB — BASIC METABOLIC PANEL
Anion gap: 17 — ABNORMAL HIGH (ref 5–15)
BUN: 59 mg/dL — ABNORMAL HIGH (ref 8–23)
CO2: 19 mmol/L — ABNORMAL LOW (ref 22–32)
Calcium: 9.7 mg/dL (ref 8.9–10.3)
Chloride: 92 mmol/L — ABNORMAL LOW (ref 98–111)
Creatinine, Ser: 3.07 mg/dL — ABNORMAL HIGH (ref 0.44–1.00)
GFR calc Af Amer: 17 mL/min — ABNORMAL LOW (ref >60)
GFR calc non Af Amer: 15 mL/min — ABNORMAL LOW (ref >60)
Glucose, Bld: 90 mg/dL (ref 70–99)
Potassium: 4.1 mmol/L (ref 3.5–5.1)
Sodium: 128 mmol/L — ABNORMAL LOW (ref 135–145)

## 2019-07-20 LAB — GLUCOSE, CAPILLARY
Glucose-Capillary: 62 mg/dL — ABNORMAL LOW (ref 70–99)
Glucose-Capillary: 76 mg/dL (ref 70–99)
Glucose-Capillary: 132 mg/dL — ABNORMAL HIGH (ref 70–99)
Glucose-Capillary: 88 mg/dL (ref 70–99)
Glucose-Capillary: 153 mg/dL — ABNORMAL HIGH (ref 70–99)

## 2019-07-20 MED ORDER — METOLAZONE 5 MG PO TABS
5.0000 mg | ORAL_TABLET | ORAL | Status: DC
  Administered 2019-07-21: 5 mg via ORAL
  Filled 2019-07-20: qty 1

## 2019-07-20 MED ORDER — FUROSEMIDE 10 MG/ML IJ SOLN
80.0000 mg | Freq: Three times a day (TID) | INTRAMUSCULAR | Status: DC
  Administered 2019-07-20 – 2019-07-21 (×4): 80 mg via INTRAVENOUS
  Filled 2019-07-20 (×5): qty 8

## 2019-07-20 MED ORDER — FUROSEMIDE 10 MG/ML IJ SOLN
80.0000 mg | Freq: Once | INTRAMUSCULAR | Status: AC
  Administered 2019-07-20: 80 mg via INTRAVENOUS
  Filled 2019-07-20: qty 8

## 2019-07-20 NOTE — Progress Notes (Signed)
Subjective:  Feels very weak and short of breaht  Objective:  Vital Signs in the last 24 hours: Temp:  [97.3 F (36.3 C)-97.9 F (36.6 C)] 97.8 F (36.6 C) (08/06 0725) Pulse Rate:  [72-99] 94 (08/06 0725) Resp:  [11-23] 19 (08/06 0725) BP: (87-144)/(48-130) 142/67 (08/06 0725) SpO2:  [87 %-100 %] 100 % (08/06 0815) FiO2 (%):  [10 %] 10 % (08/05 0905) Weight:  [103.5 kg] 103.5 kg (08/06 0325)  Intake/Output from previous day: 08/05 0701 - 08/06 0700 In: 855 [P.O.:840] Out: 625 [Urine:625]  Physical Exam Constitutional: She is oriented to person, place, and time. She is easily aroused. She appears in mild distress. Appears chronically sick. Moderately obese, appears older than stated age  HENT:  Head: Atraumatic.  Eyes: Conjunctivae are normal.  Neck: Neck supple. JVD (to the angle of the jaw) present. No thyromegaly present.  Short neck and difficult to evaluate JVP  Cardiovascular: Normal rate, regular rhythm and intact distal pulses. Exam reveals distant heart sounds. Exam reveals no gallop.  No murmur heard. Pulses:      Carotid pulses are 2+ on the right side and 2+ on the left side.      Dorsalis pedis pulses are 2+ on the right side and 2+ on the left side.       Posterior tibial pulses are 2+ on the right side and 2+ on the left side.  2-3 + Pitting edema  Pulmonary/Chest: She is in respiratory distress (mild and chronic). She has no wheezes. She has no rales.  Abdominal: Soft. Bowel sounds are normal.  Obese. Pannus present.  Musculoskeletal: Normal range of motion.  Neurological: She is alert, oriented to person, place, and time and easily aroused.  Skin: Skin is warm and dry.  Psychiatric: She has a normal mood and affect.    Lab Results: BMP Recent Labs    07/19/19 0657 07/19/19 1036 07/19/19 2037 07/20/19 0232  NA 127*  --  122* 128*  K 4.0  --  3.9 4.1  CL 92*  --  91* 92*  CO2 22  --  19* 19*  GLUCOSE 78  --  126* 90  BUN 54*  --  59* 59*   CREATININE 3.07* 2.89* 3.08* 3.07*  CALCIUM 9.8  --  8.8* 9.7  GFRNONAA 15* 16* 15* 15*  GFRAA 17* 19* 17* 17*    CBC Recent Labs  Lab 07/19/19 1036  WBC 4.1  RBC 5.86*  HGB 14.8  HCT 48.8*  PLT 280  MCV 83.3  MCH 25.3*  MCHC 30.3  RDW 16.3*    HEMOGLOBIN A1C Lab Results  Component Value Date   HGBA1C 6.5 (H) 02/24/2019   MPG 139.85 02/24/2019    Cardiac Panel (last 3 results) Recent Labs    01/03/19 1300 01/25/19 0323 03/27/19 1550  TROPONINI <0.03 <0.03 0.03*    BNP (last 3 results) Recent Labs    02/26/19 0632 03/27/19 1531 07/19/19 0657  BNP 547.8* 593.2* 1,147.6*    TSH Recent Labs    02/26/19 1453  TSH 0.308*    Lipid Panel     Component Value Date/Time   CHOL 166 06/13/2017 0728   TRIG 72 06/13/2017 0728   HDL 61 06/13/2017 0728   CHOLHDL 2.7 06/13/2017 0728   VLDL 14 06/13/2017 0728   LDLCALC 91 06/13/2017 0728   LDLDIRECT 142.0 03/19/2016 1129     Hepatic Function Panel Recent Labs    03/27/19 1531 03/29/19 0513 07/19/19 0746  PROT  7.0 6.8 6.2*  ALBUMIN 3.9 3.7 3.3*  AST 20 22 39  ALT _0 ALKPHOS 64 57 77  BILITOT 0.6 0.9 1.2  BILIDIR  --   --  0.4*  IBILI  --   --  0.8    CARDIAC STUDIES:  Echocardiogram 01/07/2019: - Left ventricle: The cavity size was normal. Wall thickness was  increased in a pattern of mild LVH. Systolic function was normal.  The estimated ejection fraction was in the range of 55% to 60%.  Wall motion was normal; there were no regional wall motion  abnormalities. Doppler parameters are consistent with abnormal  left ventricular relaxation (grade 1 diastolic dysfunction).  Doppler parameters are consistent with high ventricular filling  pressure. - Right ventricle: Systolic function was moderately reduced.  EKG 07/19/2019:  Sinus rhythm Borderline prolonged PR interval Low voltage with right axis deviation Probable right ventricular hypertrophy   Assessment & Recommendations:  68  y/o [place] female with advanced pulmonary hypertension, cor pulmonale, worsening CKD, hypertension, COPD.  Acute on chronic cor pulmonale: Secondary to severe PH WHO Grp II/III due to combination of COPD and HFpEF. Treatment options limited due to advanced stage. Increase lasix 80 mg tid, metolazone 5 mg every other day. At baseline, patient is with hospice care and has DNR wishes. Do not recommend further aggressive management. She is hemodynamically stable and do not recommend pressors/inotropes.   AKI/CKD: Cardiorenal syndrome. Do not think she is a candidate for dialysis.   Overall prognosis is guarded.    Nigel Mormon, M.D. 07/20/2019, 8:41 AM [facility] Pager: [phone] Office: [phone] If no answer: [phone]

## 2019-07-20 NOTE — Progress Notes (Signed)
Subjective: Pt was seen at the bedside this morning. On 15L HFNC, and appeared ill in the room. She had right sided leg pain (of thigh?) that had began 2 weeks ago. She appeared confused, but was answering questions.  Objective:  Vital signs in last 24 hours: Vitals:   07/20/19 0815 07/20/19 1120 07/20/19 1122 07/20/19 1603  BP:  (!) 89/58 91/72 (!) 93/56  Pulse:  79  85  Resp:  17  17  Temp:  (!) 97.3 F (36.3 C)  97.8 F (36.6 C)  TempSrc:  Axillary  Axillary  SpO2: 100% 90%  (!) 88%  Weight:      Height:       Physical Exam Vitals signs reviewed.  Constitutional:      Appearance: She is ill-appearing.  HENT:     Mouth/Throat:     Mouth: Mucous membranes are dry.  Eyes:     General:        Right eye: Discharge present.        Left eye: Discharge present. Cardiovascular:     Rate and Rhythm: Regular rhythm. Tachycardia present.     Heart sounds: Normal heart sounds. No murmur. No friction rub. No gallop.      Comments: Pitting edema up to abdomen. Pulmonary:     Effort: Pulmonary effort is normal. Prolonged expiration present.     Breath sounds: Decreased air movement present.     Comments: Crackles noted at bases bilaterally. Abdominal:     General: There is no distension.     Palpations: Abdomen is soft.     Tenderness: There is no abdominal tenderness.  Musculoskeletal:     Right lower leg: 3+ Pitting Edema present.     Left lower leg: 3+ Pitting Edema present.     Comments: R leg mildly warmer than L leg. No erythema. No calf tenderness noted bilaterally.     Assessment/Plan: Assessment - Ms. Vanschaick is a 68 year old F with significant PMH of end stage heart failure and COPD, CKD stage 3/4, diabetes, OSA, and memory disorder who presented to the ED due to volume overload and a fall at home.   Acute on Chronic Heart Failure with Pulmonary Hypertension Pt presented to the ED with volume overload and elevated BNP. With anasarca on exam and minimal new dyspnea,  current admission most consistent with exacerbation of right sided heart failure. Echo in Jan showed LV EF 55-60%, G1DD, moderately reduced RV EF, PA peak pressure 30mHg. Per outpatient cardiology notes, pt's condition at advanced stage. Unclear if patient was in Hospice prior to this admission based on report from pt's niece, who is her healthcare POA and caregiver. Did begin following with Palliative Care outpatient after admission in April. - holding home spironolactone in light of soft pressures Cardiology consulted, appreciate recommendations  - do not recommend further aggressive management  - not recommending pressors or inotropes at this time  - add metolazone 546mevery other day  - lasix 8049mV TID  - continue sildenafil for PH and RV failure  - discontinue atorvastatin - strict I/Os - supplemental oxygen (high flow) to keep O2 saturation >90%   Hypervolemic Hyponatremia [name] Na was 127 on admission. Most likely related to patient's HF and resulting volume overload. Per chart review, sodium also low during earlier hospitalizations this year for decompensated HF and respiratory failure. - Na 128 this morning - fluid restriction - monitor daily BMP   AKI on CKD [name] Cr was 3.07  on admission with prior baseline Cr is about 2.3. Rise in Cr attributed to volume overload vs worsening CKD vs cardiorenal syndrome - so may or may not improve with diuresis. - continue to diuresis - check daily BMPs - strict I/Os   Chronic Hypoxic Respiratory Failure due to COPD Pt noted to have centrolobular emphysema on CT, GOLD stage 0 due to PFTs not meeting criteria. Multiple hospitalizations due to COPD and respiratory failure. Is on 7-8L O2 at home. Niece reports desaturations at home that improve with repositioning and CPAP machine use. Pt on high flow today and breathing through pursed lips on exam.  - continue home inhalers  - dulera 2 puffs BID  - albuterol 2.30m q4h PRN  - combivent 1-2  puffs q4h PRN - duonebs q4h PRN - home trilogy machine used as CPAP - supplemental O2 (high flow) to keep saturation >90%   Diabetes Pt takes glimepiride and metformin at home.  - SSI  Diet - Heart healthy with fluid restriction Fluids - none DVT ppx - lovenox [id number] daily CODE STATUS - DNR   Dispo: Anticipated discharge in approximately 3-5 day(s).    JoLadona HornsMD 07/20/2019, 4:41 PM Pager: [phone]

## 2019-07-20 NOTE — Progress Notes (Signed)
  Date: 07/20/2019  Patient name: Elizabeth Thompson  Medical record number: [id number]  Date of birth: 11/05/1951   I have seen and evaluated Dafina W Freitas and discussed their care with the Residency Team. Briefly, Ms. Wiberg is a 68 year old woman with PMH of CKD 4, HFpEF, pHTN, DM2 who presented for acute weight gain, SOB, volume overload and after a fall due to generalized weakness.  Cardiology has seen her and made recommendations.  She notes right sided leg pain and swelling up to the abdomen.  She is mildly confused this morning, but alert.     Vitals:   07/20/19 0725 07/20/19 0815  BP: (!) 142/67   Pulse: 94   Resp: 19   Temp: 97.8 F (36.6 C)   SpO2: 95% 100%   Gen: Lying in bed, SOB, appears older than stated age Eyes: Anicteric sclerae HENT: Oval in place CV: RR, NR, no murmur, distant heart sounds, 3+ pitting edema to the abdomen, pulses palpable Pulm: Crackles at bases, decreased breath sounds throughout, pursed lip breathing, increased I:E ratio Abd: +BS, sacral edema, NT MSK: Decreased muscle tone Pysch: Alert, conversant, appears fatigued  Assessment and Plan: I have seen and evaluated the patient as outlined above. I agree with the formulated Assessment and Plan as detailed in the residents' note, with the following changes:   Acute on chronic HFpEF Pulmonary HTN Volume Overload - Reviewed Cardiology notes - Furosemide 88m IV TID - Every other day metolazone 529m- Strict I/Os - Oxygen to keep O2 saturation > 90% (high flow) - Continue home sildenafil - Pure Wick  Hypervolemic Hyponatremia - Should improve with diuresis, monitor daily with BMET  COPD Acute on chronic hypoxic respiratory failure - Continue Oxygen to keep O2 saturation > 90% - Continue home medications - Increased wheezing on exam, consider duonebs - Flutter valve, IS to bedside  Cardiorenal syndrome CKD 4 - Renal function above baseline, Hopefully will improve with diuresis, but with  her history may worsen - Monitor renal function closely - Strict I/Os.   Other issues per Dr. JoRonnald Rampdaily note.   MuSid FalconMD 8/6/202010:44 AM

## 2019-07-20 NOTE — Progress Notes (Signed)
Inpatient Diabetes Program Recommendations  AACE/ADA: New Consensus Statement on Inpatient Glycemic Control (2015)  Target Ranges:  Prepandial:   less than 140 mg/dL      Peak postprandial:   less than 180 mg/dL (1-2 hours)      Critically ill patients:  140 - 180 mg/dL   Lab Results  Component Value Date   GLUCAP 76 07/20/2019   HGBA1C 6.5 (H) 02/24/2019    Review of Glycemic Control Results for Elizabeth Thompson, Elizabeth Thompson (MRN [id number]) as of 07/20/2019 10:14  Ref. Range 07/20/2019 06:16 07/20/2019 06:40  Glucose-Capillary Latest Ref Range: 70 - 99 mg/dL 62 (L) 76   Diabetes history: Type 2 DM Outpatient Diabetes medications: Amaryl 2 mg QAM, Metformin 500 mg BID Current orders for Inpatient glycemic control: Novolog 0-9 units TID  Inpatient Diabetes Program Recommendations:    Last A1C was from March. Consider repeating A1C?  Also, noted hypoglycemia of 62 mg/dL this AM. Patient has not received insulin during hospitalization. Will watch trends.   Would not recommend Metformin at discharge given renal status.   Thanks, Bronson Curb, MSN, RNC-OB Diabetes Coordinator [phone] (8a-5p)

## 2019-07-20 NOTE — Progress Notes (Signed)
Lower extremity venous has been completed.   Preliminary results in CV Proc.   Abram Sander 07/20/2019 1:57 PM

## 2019-07-20 NOTE — Progress Notes (Signed)
Pt is currently on a 15L HFNC, unable to place on CPAP. Pt seems to be resting comfortably at this time. RT will continue to monitor as needed.

## 2019-07-20 NOTE — Progress Notes (Signed)
Hypoglycemic Event  CBG: 62  Treatment: 8 oz juice/soda  Symptoms: None  Follow-up CBG: [id number] CBG Result: 76  Possible Reasons for Event: Inadequate meal intake     Kara Dies

## 2019-07-20 NOTE — Plan of Care (Signed)
  Problem: Education: Goal: Ability to state activities that reduce stress will improve Outcome: Progressing

## 2019-07-20 NOTE — Discharge Summary (Signed)
Name: Elizabeth Thompson MRN: [id number] DOB: 04-03-1951 68 y.o. PCP: Everardo Beals, NP  Date of Admission: 07/19/2019  6:01 AM Date of Discharge: 08/01/2019 Attending Physician: Oda Kilts, MD  Discharge Diagnosis: 1. Acute on chronic diastolic HF 2. Severe pulmonary hypertension with cor pulmonale 3. Acute on chronic hypoxic respiratory failure 4. CKD stage 3/4   Discharge Medications: Allergies as of 08/01/2019      Reactions   Codeine Nausea And Vomiting   Nitrostat [nitroglycerin] Other (See Comments)   Pt is on revatio   Prednisone Other (See Comments)   Has to monitor because she is a diabetic       Medication List    STOP taking these medications   aspirin 81 MG EC tablet   atorvastatin 20 MG tablet Commonly known as: LIPITOR   BIOFREEZE EX   brimonidine 0.2 % ophthalmic solution Commonly known as: ALPHAGAN   donepezil 5 MG tablet Commonly known as: ARICEPT   dorzolamide-timolol 22.3-6.8 MG/ML ophthalmic solution Commonly known as: COSOPT   ferrous sulfate 325 (65 FE) MG tablet   furosemide 80 MG tablet Commonly known as: LASIX   glimepiride 4 MG tablet Commonly known as: AMARYL   glucose blood test strip Commonly known as: Accu-Chek Aviva   latanoprost 0.005 % ophthalmic solution Commonly known as: XALATAN   Magnesium 250 MG Tabs   metFORMIN 1000 MG tablet Commonly known as: GLUCOPHAGE   Mucinex Sinus-Max 10-650-400 MG/20ML Liqd Generic drug: Phenylephrine-APAP-guaiFENesin   Selenium Sulf-Pyrithione-Urea 2.25 % Sham   sildenafil 20 MG tablet Commonly known as: REVATIO   spironolactone 25 MG tablet Commonly known as: ALDACTONE     TAKE these medications   acetaminophen 650 MG CR tablet Commonly known as: TYLENOL Take 1,300 mg by mouth See admin instructions. Take 2 tablets (1300 mg) by mouth every morning, make take 2 tablets (1300 mg) at night as needed for pain   albuterol (2.5 MG/3ML) 0.083% nebulizer solution  Commonly known as: PROVENTIL Take 2.5 mg by nebulization every 4 (four) hours as needed for wheezing or shortness of breath.   antiseptic oral rinse Liqd Apply 15 mLs topically as needed for dry mouth.   ARIPiprazole 20 MG tablet Commonly known as: ABILIFY Take 1 tablet (20 mg total) by mouth at bedtime.   ASPERCREME LIDOCAINE EX Apply 1 application topically 2 (two) times daily as needed (back pain).   bisacodyl 10 MG suppository Commonly known as: DULCOLAX Place 1 suppository (10 mg total) rectally daily as needed for moderate constipation.   budesonide-formoterol 160-4.5 MCG/ACT inhaler Commonly known as: SYMBICORT Inhale 2 puffs into the lungs 2 (two) times daily.   Combivent Respimat 20-100 MCG/ACT Aers respimat Generic drug: Ipratropium-Albuterol Inhale 1-2 puffs into the lungs every 4 (four) hours as needed for wheezing.   fentaNYL 100 MCG/2ML injection Commonly known as: SUBLIMAZE Inject 0.25-0.5 mLs (12.5-25 mcg total) into the vein every 2 (two) hours as needed for moderate pain or severe pain.   Flutter Devi Use as directed.   Gerhardt's butt cream Crea Apply 1 application topically daily. Start taking on: August 02, 2019   glycopyrrolate 1 MG tablet Commonly known as: ROBINUL Take 1 tablet (1 mg total) by mouth every 4 (four) hours as needed (excessive secretions).   haloperidol 0.5 MG tablet Commonly known as: HALDOL Take 1 tablet (0.5 mg total) by mouth every 4 (four) hours as needed for agitation (or delirium).   lamoTRIgine 100 MG tablet Commonly known as: LAMICTAL Take 1 tablet (  100 mg total) by mouth every evening. What changed: when to take this   midodrine 10 MG tablet Commonly known as: PROAMATINE Take 1 tablet (10 mg total) by mouth 3 (three) times daily with meals.   ondansetron 4 MG disintegrating tablet Commonly known as: ZOFRAN-ODT Take 1 tablet (4 mg total) by mouth every 6 (six) hours as needed for nausea.   OXYGEN Inhale 8 L into  the lungs continuous.   polyethylene glycol 17 g packet Commonly known as: MIRALAX / GLYCOLAX Take 17 g by mouth daily. What changed:   when to take this  reasons to take this  additional instructions   polyvinyl alcohol 1.4 % ophthalmic solution Commonly known as: LIQUIFILM TEARS Place 1 drop into both eyes 4 (four) times daily as needed for dry eyes.   PRESCRIPTION MEDICATION Inhale into the lungs 2 (two) times daily. Trilogy Machine   senna 8.6 MG tablet Commonly known as: SENOKOT Take 1 tablet by mouth daily as needed for constipation.   torsemide 20 MG tablet Commonly known as: DEMADEX Take 2 tablets (40 mg total) by mouth 2 (two) times daily.       Disposition and follow-up:   Elizabeth Thompson was discharged from [facility] in Serious condition.  At the hospital follow up visit please address:  1. Pt admitted to the hospital for volume overload from an acute exacerbation of her chronic diastolic HF secondary to severe pulmonary hypertension with cor pulmonale. [name] clinical condition did not improve with diuresis, and she is being transferred to residential hospice for continued comfort measures.  2.  Labs / imaging needed at time of follow-up: NONE  3.  Pending labs/ test needing follow-up: NONE  Follow-up Appointments: [facility] Course by problem list: Elizabeth Thompson is a 68 year old F who presented to the ED due to volume overload with anasarca on exam and minimal new shortness of breath and elevated BNP consistent with exacerbation of right sided heart failure. Her PMH is significant for chronic diastolic HF, severe pulmonary hypertension with cor pulmonale, CKD stage3/4, and end-stage COPD. She was diuresed with IV Lasix and then a Lasix infusion. Required midodrine for blood pressure support. Wt down 14 kg from admission. Due to her end-stage organ dysfunction, she was not a candidate for inotropes, pressors, or other advanced therapies  per advanced heart failure team.   Elizabeth Thompson, Elizabeth Thompson, is her caregiver and healthcare POA. On day 4 of admission, pt was more somnolent and having decreased PO intake. Elizabeth Thompson requested a palliative consult. Reversible causes for her AMS were investigated per Elizabeth Thompson's wishes. CT head was negative for acute stroke, ammonia normal, ABG with normal pH and pCO2 and low pO2. ?UTI on UA was treated with 5 days ceftriaxone. Pt showed no signs of clinical improvement despite all available medical interventions. On hospital day 9, pt was too letheragic and hypotensive to work with physical therapy so her diuretics were held. At this time, the treatment team, including medicine, cardiology, and palliative, were in agreement that hospice and comfort measures were appropriate in light of pt's very poor prognosis in the setting of continued lethargy, general debilitated state, and multi-organ end stage disease. Elizabeth Thompson initially desired continued watchful waiting, but on day 11 pt's prognosis unchanged and Elizabeth Thompson agreeable to patient transfer to residential hospice. While referral was in process, torsemide 53m continued for comfort.   Discharge Vitals:   BP 104/80 (BP Location: Right Wrist)   Pulse 90  Temp (!) 97.5 F (36.4 C) (Oral)   Resp (!) 21   Ht _0  (1.6 m)   Wt 89.7 kg   SpO2 95%   BMI 35.03 kg/m   Pertinent Labs, Studies, and Procedures:  CBC Latest Ref Rng & Units 07/23/2019 07/19/2019 04/01/2019  WBC 4.0 - 10.5 K/uL 4.5 4.2 10.1  Hemoglobin 12.0 - 15.0 g/dL 13.8 14.5 13.8  Hematocrit 36.0 - 46.0 % 43.6 46.7(H) 45.5  Platelets 150 - 400 K/uL 371 406(H) 570(H)   BMP Latest Ref Rng & Units 07/30/2019 07/29/2019 07/28/2019  Glucose 70 - 99 mg/dL 126(H) 120(H) 116(H)  BUN 8 - 23 mg/dL 70(H) 68(H) 66(H)  Creatinine 0.44 - 1.00 mg/dL 2.67(H) 2.66(H) 2.65(H)  BUN/Creat Ratio 11 - 26 - - -  Sodium 135 - 145 mmol/L 129(L) 131(L) 131(L)  Potassium 3.5 - 5.1 mmol/L 3.4(L) 3.3(L) 3.9  Chloride 98 - 111 mmol/L 86(L)  89(L) 89(L)  CO2 22 - 32 mmol/L _1 Calcium 8.9 - 10.3 mg/dL 9.8 9.7 9.7    EKG Interpretation  Date/Time:  Wednesday July 19 2019 07:57:08 EDT Ventricular Rate:  90 PR Interval:    QRS Duration: 107 QT Interval:  387 QTC Calculation: 474 R Axis:   146 Text Interpretation:  Sinus rhythm Borderline prolonged PR interval Low voltage with right axis deviation Probable right ventricular hypertrophy Artifact improved. No STEMI  Confirmed by Nanda Quinton [phone]) on 07/19/2019 9:40:23 AM      Urinalysis    Component Value Date/Time   COLORURINE YELLOW 07/24/2019 1125   APPEARANCEUR HAZY (A) 07/24/2019 1125   LABSPEC 1.006 07/24/2019 1125   PHURINE 5.0 07/24/2019 1125   GLUCOSEU NEGATIVE 07/24/2019 1125   HGBUR MODERATE (A) 07/24/2019 1125   BILIRUBINUR NEGATIVE 07/24/2019 1125   KETONESUR NEGATIVE 07/24/2019 1125   PROTEINUR NEGATIVE 07/24/2019 1125   UROBILINOGEN 0.2 08/17/2015 0109   NITRITE NEGATIVE 07/24/2019 1125   LEUKOCYTESUR LARGE (A) 07/24/2019 1125   CT Head on 07/24/2019 CLINICAL DATA:  Altered level of consciousness.  EXAM: CT HEAD WITHOUT CONTRAST  TECHNIQUE: Contiguous axial images were obtained from the base of the skull through the vertex without intravenous contrast.  COMPARISON:  MRI 09/21/2014  FINDINGS: Brain: Age related volume loss. Mild chronic small-vessel change of the deep white matter. No sign of acute infarction, mass lesion, hemorrhage, hydrocephalus or extra-axial collection.  Vascular: There is atherosclerotic calcification of the major vessels at the base of the brain.  Skull: Negative  Sinuses/Orbits: Clear/normal  Other: None  IMPRESSION: No acute finding by CT. Mild age related volume loss and chronic small-vessel change of the hemispheric deep white matter.   CXR on 07/19/2019 CLINICAL DATA:  Shortness of breath  EXAM: PORTABLE CHEST 1 VIEW  COMPARISON:  03/29/2019  FINDINGS: Chronic cardiomegaly and  interstitial coarsening. Negative mediastinal contours when accounting for leftward rotation. There is no edema, consolidation, effusion, or pneumothorax.  IMPRESSION: No acute finding when compared to prior.  Chronic interstitial disease and cardiomegaly.   Discharge Instructions: Discharge Instructions    Discharge instructions   Complete by: As directed    Ms. Belnap is being discharged to [facility] for continued comfort care measures.      Signed: Ladona Horns, MD 08/01/2019, 11:57 AM   Pager: [phone]

## 2019-07-21 ENCOUNTER — Encounter (HOSPITAL_COMMUNITY): Payer: Self-pay | Admitting: Cardiology

## 2019-07-21 DIAGNOSIS — E871 Hypo-osmolality and hyponatremia: Secondary | ICD-10-CM

## 2019-07-21 DIAGNOSIS — N179 Acute kidney failure, unspecified: Secondary | ICD-10-CM

## 2019-07-21 DIAGNOSIS — J432 Centrilobular emphysema: Secondary | ICD-10-CM

## 2019-07-21 DIAGNOSIS — Z7984 Long term (current) use of oral hypoglycemic drugs: Secondary | ICD-10-CM

## 2019-07-21 DIAGNOSIS — I5033 Acute on chronic diastolic (congestive) heart failure: Secondary | ICD-10-CM

## 2019-07-21 LAB — GLUCOSE, CAPILLARY
Glucose-Capillary: 78 mg/dL (ref 70–99)
Glucose-Capillary: 106 mg/dL — ABNORMAL HIGH (ref 70–99)
Glucose-Capillary: 92 mg/dL (ref 70–99)
Glucose-Capillary: 88 mg/dL (ref 70–99)

## 2019-07-21 LAB — BASIC METABOLIC PANEL
Anion gap: 13 (ref 5–15)
BUN: 59 mg/dL — ABNORMAL HIGH (ref 8–23)
CO2: 26 mmol/L (ref 22–32)
Calcium: 9.4 mg/dL (ref 8.9–10.3)
Chloride: 87 mmol/L — ABNORMAL LOW (ref 98–111)
Creatinine, Ser: 3.1 mg/dL — ABNORMAL HIGH (ref 0.44–1.00)
GFR calc Af Amer: 17 mL/min — ABNORMAL LOW (ref >60)
GFR calc non Af Amer: 15 mL/min — ABNORMAL LOW (ref >60)
Glucose, Bld: 87 mg/dL (ref 70–99)
Potassium: 3.8 mmol/L (ref 3.5–5.1)
Sodium: 126 mmol/L — ABNORMAL LOW (ref 135–145)

## 2019-07-21 MED ORDER — FUROSEMIDE 10 MG/ML IJ SOLN
20.0000 mg/h | INTRAVENOUS | Status: DC
  Administered 2019-07-21 – 2019-07-28 (×12): 20 mg/h via INTRAVENOUS
  Filled 2019-07-21 (×7): qty 25
  Filled 2019-07-21: qty 20
  Filled 2019-07-21: qty 5
  Filled 2019-07-21 (×3): qty 25
  Filled 2019-07-21: qty 21
  Filled 2019-07-21: qty 25
  Filled 2019-07-21: qty 21

## 2019-07-21 MED ORDER — ACETAZOLAMIDE 250 MG PO TABS
250.0000 mg | ORAL_TABLET | Freq: Two times a day (BID) | ORAL | Status: DC
  Administered 2019-07-21 – 2019-07-27 (×10): 250 mg via ORAL
  Filled 2019-07-21 (×10): qty 1

## 2019-07-21 NOTE — Progress Notes (Signed)
Subjective:  More awake and responsive this morning. 1.5 L urine output in 24 hours.   Objective:  Vital Signs in the last 24 hours: Temp:  [97.3 F (36.3 C)-98 F (36.7 C)] 97.9 F (36.6 C) (08/07 0728) Pulse Rate:  [52-90] 82 (08/07 0300) Resp:  [12-21] 13 (08/07 0300) BP: (89-105)/(56-86) 105/86 (08/07 0300) SpO2:  [81 %-100 %] 100 % (08/07 0300) Weight:  [99 kg] 99 kg (08/07 0500)  Intake/Output from previous day: 08/06 0701 - 08/07 0700 In: 664 [P.O.:840; I.V.:3] Out: 1500 [Urine:1500]  Physical Exam Constitutional: She is oriented to person, place, and time. She is easily aroused. She appears in mild distress. Appears chronically sick. Moderately obese, appears older than stated age  HENT:  Head: Atraumatic.  Eyes: Conjunctivae are normal.  Neck: Neck supple. JVD (to the angle of the jaw) present. No thyromegaly present.  Short neck and difficult to evaluate JVP  Cardiovascular: Normal rate, regular rhythm and intact distal pulses. Exam reveals distant heart sounds. Exam reveals no gallop.  No murmur heard. Pulses:      Carotid pulses are 2+ on the right side and 2+ on the left side.      Dorsalis pedis pulses are 2+ on the right side and 2+ on the left side.       Posterior tibial pulses are 2+ on the right side and 2+ on the left side.  2-3 + Pitting edema  Pulmonary/Chest: She is in respiratory distress (mild and chronic). She has no wheezes. She has no rales.  Abdominal: Soft. Bowel sounds are normal.  Obese. Pannus present.  Musculoskeletal: Normal range of motion.  Neurological: She is alert, oriented to person, place, and time and easily aroused.  Skin: Skin is warm and dry.  Psychiatric: She has a normal mood and affect.    Lab Results: BMP Recent Labs    07/19/19 2037 07/20/19 0232 07/21/19 0245  NA 122* 128* 126*  K 3.9 4.1 3.8  CL 91* 92* 87*  CO2 19* 19* 26  GLUCOSE 126* 90 87  BUN 59* 59* 59*  CREATININE 3.08* 3.07* 3.10*  CALCIUM 8.8*  9.7 9.4  GFRNONAA 15* 15* 15*  GFRAA 17* 17* 17*    CBC Recent Labs  Lab 07/19/19 1036  WBC 4.1  RBC 5.86*  HGB 14.8  HCT 48.8*  PLT 280  MCV 83.3  MCH 25.3*  MCHC 30.3  RDW 16.3*    HEMOGLOBIN A1C Lab Results  Component Value Date   HGBA1C 6.5 (H) 02/24/2019   MPG 139.85 02/24/2019    Cardiac Panel (last 3 results) Recent Labs    01/03/19 1300 01/25/19 0323 03/27/19 1550  TROPONINI <0.03 <0.03 0.03*    BNP (last 3 results) Recent Labs    02/26/19 0632 03/27/19 1531 07/19/19 0657  BNP 547.8* 593.2* 1,147.6*    TSH Recent Labs    02/26/19 1453  TSH 0.308*    Lipid Panel     Component Value Date/Time   CHOL 166 06/13/2017 0728   TRIG 72 06/13/2017 0728   HDL 61 06/13/2017 0728   CHOLHDL 2.7 06/13/2017 0728   VLDL 14 06/13/2017 0728   LDLCALC 91 06/13/2017 0728   LDLDIRECT 142.0 03/19/2016 1129     Hepatic Function Panel Recent Labs    03/27/19 1531 03/29/19 0513 07/19/19 0746  PROT 7.0 6.8 6.2*  ALBUMIN 3.9 3.7 3.3*  AST 20 22 39  ALT _0 ALKPHOS 64 57 77  BILITOT 0.6 0.9  1.2  BILIDIR  --   --  0.4*  IBILI  --   --  0.8    CARDIAC STUDIES:  Echocardiogram 01/07/2019: - Left ventricle: The cavity size was normal. Wall thickness was  increased in a pattern of mild LVH. Systolic function was normal.  The estimated ejection fraction was in the range of 55% to 60%.  Wall motion was normal; there were no regional wall motion  abnormalities. Doppler parameters are consistent with abnormal  left ventricular relaxation (grade 1 diastolic dysfunction).  Doppler parameters are consistent with high ventricular filling  pressure. - Right ventricle: Systolic function was moderately reduced.  EKG 07/19/2019:  Sinus rhythm Borderline prolonged PR interval Low voltage with right axis deviation Probable right ventricular hypertrophy   Assessment & Recommendations:  68 y/o [place] female with advanced pulmonary hypertension,  cor pulmonale, worsening CKD, hypertension, COPD.  Acute on chronic cor pulmonale: Secondary to severe PH WHO Grp II/III due to combination of COPD and HFpEF. Treatment options limited due to advanced stage. Increase lasix 80 mg tid, metolazone 5 mg every other day. Continue sildenafil. At baseline, patient is with hospice care and has DNR wishes. Do not recommend further aggressive management. She is hemodynamically stable and do not recommend pressors/inotropes.   AKI/CKD: Cardiorenal syndrome. Do not think she is a candidate for dialysis.   Hyponatremia: Dilutional  Overall prognosis is guarded. Consider inpatient hospice consult.    Nigel Mormon, M.D. 07/21/2019, 7:59 AM [facility] Pager: [phone] Office: [phone] If no answer: [phone]

## 2019-07-21 NOTE — Progress Notes (Signed)
Subjective: Pt was seen at the bedside this morning. Head of bed elevated so pt could sit up. She was slightly more alert than yesterday, stated R leg pain had improved. HFNC on at 15L. Urine output 1.5L yesterday for balance -648m.   Objective:  Vital signs in last 24 hours: Vitals:   07/20/19 2021 07/20/19 2300 07/21/19 0300 07/21/19 0500  BP: (!) 90/56 (!) 98/58 105/86   Pulse: 82 (!) 52 82   Resp: _0 Temp: 97.8 F (36.6 C) 97.9 F (36.6 C) 98 F (36.7 C)   TempSrc: Axillary Oral Oral   SpO2: 93% 100% 100%   Weight:    99 kg  Height:       Physical Exam Vitals signs reviewed.  Constitutional:      Appearance: She is obese. She is ill-appearing.     Comments: Pt more awake this morning. Still chronically ill appearing.  HENT:     Mouth/Throat:     Mouth: Mucous membranes are dry.  Cardiovascular:     Rate and Rhythm: Normal rate and regular rhythm.     Heart sounds: Heart sounds are distant.  Pulmonary:     Effort: Tachypnea, prolonged expiration and respiratory distress (Chronic) present.     Breath sounds: Decreased air movement present. No wheezing, rhonchi or rales.  Abdominal:     General: There is no distension.     Palpations: Abdomen is soft.     Tenderness: There is no abdominal tenderness.  Musculoskeletal:     Right lower leg: 3+ Pitting Edema present.     Left lower leg: 3+ Pitting Edema present.      Assessment/Plan: Assessment - Ms. Grisso is a 68year old F with significant PMH of end stage heart failure and COPD, CKD stage 3/4, diabetes, OSA, and memory disorder who presented to the ED due to volume overload and a fall at home. This appears to be an acute worsening of the chronic progression of her end-stage HF, COPD, and CKD.  Acute on Chronic Heart Failure with Pulmonary Hypertension Pt presented to the ED with volume overload and elevated BNP. With anasarca on exam and minimal new dyspnea, current admission most consistent with  exacerbation of right sided heart failure. Echo in Jan showed LV EF 55-60%, G1DD, moderately reduced RV EF, PA peak pressure 846mg. Per outpatient cardiology notes, pt's condition at advanced stage. Unclear if patient was in Hospice prior to this admission based on report from pt's niece, who is her healthcare POA and caregiver. Did begin following with Palliative Care outpatient after admission in April. - holding home spironolactone in light of soft pressures - pt appears to be at her baseline status Cardiology consulted, appreciate recommendations  - do not recommend further aggressive management  - not recommending pressors or inotropes at this time  - add metolazone 32m21mvery other day  - lasix 45m632m TID  - continue sildenafil for PH and RV failure  - discontinue atorvastatin - strict I/Os (-657mL41mterday) - supplemental oxygen (high flow) to keep O2 saturation >90%  - improved leg pain this morning; likely related to patient's edema - bilateral lower extremity doppler ordered yesterday to rule out DVT in light of pt's immobility - follow-up doppler results today   Hypervolemic Hyponatremia [name] Na was 127 on admission. Most likely related to patient's HF and resulting volume overload. Per chart review, sodium also low during earlier hospitalizations this year for decompensated HF and respiratory failure. -  Na 126 this morning - fluid restriction - monitor daily BMP   Cardiorenal syndrome vs. Progression of CKD [name] Cr was 3.07 on admission (prior baseline Cr ~2.3). Rise in Cr attributed to volume overload vs worsening CKD vs cardiorenal syndrome; does not appear to be improving with diuresis. - Cr 3.10 this morning - minimal response to diuresis overnight, pt is -669m yesterday - bump in Cr, rise in bicarb (19 to 26) likely indicating a contraction alkalosis  - will continue current diuretic management as above - check daily BMPs - strict I/Os   Acute on Chronic Hypoxic  Respiratory Failure due to COPD Pt noted to have centrolobular emphysema on CT, GOLD stage 0 due to PFTs not meeting criteria. Multiple hospitalizations due to COPD and respiratory failure. Is on 7-8L O2 at home. Niece reports desaturations at home that improve with repositioning and CPAP machine use. Pt on high flow today and breathing through pursed lips on exam.  - continue home inhalers  - dulera 2 puffs BID  - albuterol 2.520mq4h PRN  - combivent 1-2 puffs q4h PRN - duonebs q4h PRN - home trilogy machine used as CPAP - supplemental O2 (high flow) to keep saturation >90%   Diabetes Pt takes glimepiride and metformin at home.  - SSI  Diet - Heart healthy with fluid restriction Fluids - none DVT ppx - lovenox 3051mubQ daily CODE STATUS - DNR   Dispo: Anticipated discharge to inpatient hospice - will discuss with family today.   JonLadona HornsD 07/21/2019, 6:32 AM Pager: [phone]

## 2019-07-21 NOTE — Progress Notes (Signed)
Pt currently on a 15L HFNC, unable to place pt on cpap for the night. RT will continue to monitor as needed.

## 2019-07-21 NOTE — Progress Notes (Signed)
  Date: 07/21/2019  Patient name: Elizabeth Thompson  Medical record number: [id number]  Date of birth: 09-Apr-1951   I have seen and evaluated this patient and I have discussed the plan of care with the house staff. Please see Dr. Ronnald Ramp' note for complete details. I concur with her findings and plan.     Sid Falcon, MD 07/21/2019, 11:55 AM

## 2019-07-21 NOTE — Care Management Important Message (Signed)
Important Message  Patient Details  Name: Elizabeth Thompson MRN: [id number] Date of Birth: [birth year]   Medicare Important Message Given:  Yes     Orbie Pyo 07/21/2019, 3:33 PM

## 2019-07-21 NOTE — Progress Notes (Signed)
Spoke with pt's niece, [name], who was at the bedside. Discussed that the internal medicine team spoke with Dr. Einar Gip earlier this morning, who explained pt's further treatment options to be limited. Niece continued to ask about specialty referrals and was not open to hospice referral at this time. Upon reaching out to Dr. Einar Gip, he stated that on behalf of the family he would call the HF team to consult.

## 2019-07-21 NOTE — Progress Notes (Signed)
I have left messages with patient's niece [name], to call me back to discuss her disposition.  I also have placed consult for advanced CHF team to give me an opinion.  Adrian Prows, MD, [facility] 07/21/2019, 5:37 PM [facility] [place] Pager: [phone] Office: [phone] If no answer Cell [phone]

## 2019-07-21 NOTE — Consult Note (Addendum)
ADVANCED HF TEAM CONSULT NOTE  Patient ID: Elizabeth Thompson MRN: [id number]; DOB: 03-24-1951  Admit date: 07/19/2019 Date of Consult: 07/21/2019  Primary Care Provider: Everardo Beals, NP Primary Cardiologist: Adrian Prows, MD  Primary Electrophysiologist:  None    Patient Profile:   Elizabeth Thompson is a 68 y.o. female with a hx of chronic cor pulmonale, severe pulmonary HTN, severe COPD who is being seen today by the Advanced HF Team  for the evaluation of pulmonary HTN and treatment at the request of Dr. Einar Gip.  History of Present Illness:   Ms. Pacifico in addition to above hx also with hx Lt wuperior pole renal cell carcinoma cryoablation 2017 with second ablation on a new lesion 09/2016.  Prior tobacco hx 40 pk year and stopped 2017, on home oxygen- severe COPD, DM, GERD, HTN, HLD CKD-4 and her PH is treated with sildenafil.    Rt heart cath 07/2017 with severe pulmonary hypertension.  Right Heart Pressures Hemodynamic findings consistent with severe pulmonary hypertension. RA pressure Mean 11 mmHg, A wave 15 mmHg V wave 12 mmHg RV pressure 78/11 mmhg with RVEDP 14 mmHg PA pressure 77/38 mmHg with mean PA pressure 53 mmHg Pulmonary capillary wedge pressure 17 mmHg  Pulmonary artery pulsatility index 3.8 Pulmonary vascular resistance 7 WU Transpulmonary gradient of 36 mmHg  Lt cardiac cath same time with non obstructive CAD.  Nuc study in 2018 with no ishcemia  Per Niece Dry wt of 180s most recently in Jan. 2020  Now admitted with increased lower ext edema, fall at home, and failure to thrive. Due to illnesses she has not been able to go to MD office.    At home up until 2 weeks ago she would be up to [facility] and use walker to go to kitchen, then has had increased edema over last 2 weeks and meds not working.  + AKI.  EKG:  The EKG was personally reviewed and demonstrates:  SR - with increased artifact EKG read with higher HR than present, HR 75, 1 st degree AV block, low voltage -  Voltage was higher in 03/2019 Telemetry:  Telemetry was personally reviewed and demonstrates:  SR  Dr. Einar Gip has seen and did not believe cardiology had much to offer.  He recommend hospice but pt and family not ready to pursue.  She had been in hospice but with COVID 19 there has been miscommunication and not using. She would benefit from IV diuretics with hospice.    She has rec'd lasix  120 mg total on the  5th and now on lasix 80 mg IV TID, she also rec'd an extra 80 mg today.  Also metolazone on the 5th and today ordered every other day.  She continues on Revation 20 mg tab.  She was on spironolactone at home 25 twice a day now stopped.    She is neg 567 - her I&O notes + po 2058   Her wt has varied from 83.5 Kg to 103.5 and today 99 Kg.    Na 126, K+ 3.8, cl 87, C02 26 BUN 59, Cr 3.10 GFR 17 ( 2018 cr 1.77 , in 2. 20 2019 and into this year)  BNP 1147  Troponin 92, 189  hgb 14.8 WBC 4.1 plts 280.     Currently BP 98/60 to 105/66 P 80s and resp 16  Afebrile  On HF [place] at 15 L with low sp02.   Pt is a DNR.   At home she was  on 8 L [place].  She denies any chest pain or SOB.  She seems comfortable but with any movement her sp02 decreases.     Heart Pathway Score:     Past Medical History:  Diagnosis Date   Acute respiratory distress 03/28/2019   Acute respiratory failure ([place]) 06/09/2017   Arthritis    Back pain    Bell's palsy    Bipolar disorder (HCC)    Bronchitis    Chronic low back pain 05/09/2015   Cough with expectoration 05/21/16   recent diagnosis of bronchitis   Cyst of right kidney    Diabetes mellitus without complication ([place])    type 2   Frequency of urination    GERD (gastroesophageal reflux disease)    Glaucoma    History of blood transfusion    History of colon polyps    History of hiatal hernia    History of tobacco use    Hypercalcemia    Hyperlipidemia    Hypertension    Memory disorder 09/05/2014   On home oxygen therapy    3L/M [place] at  night    Schizo-affective psychosis ([place])    Shortness of breath dyspnea    exertion, or with out oxygen   Uterine fibroid     Past Surgical History:  Procedure Laterality Date   ABDOMINAL HYSTERECTOMY     APPENDECTOMY     COLONOSCOPY W/ POLYPECTOMY     IR GENERIC HISTORICAL  04/29/2016   IR RADIOLOGIST EVAL & MGMT 04/29/2016 Corrie Mckusick, DO [facility]   IR GENERIC HISTORICAL  10/01/2016   IR RADIOLOGIST EVAL & MGMT 10/01/2016 [facility]   IR GENERIC HISTORICAL  11/11/2016   IR RADIOLOGIST EVAL & MGMT 11/11/2016 Corrie Mckusick, DO [facility]   IR GENERIC HISTORICAL  07/16/2016   IR RADIOLOGIST EVAL & MGMT 07/16/2016 Corrie Mckusick, DO [facility]   IR RADIOLOGIST EVAL & MGMT  03/10/2017   IR RADIOLOGIST EVAL & MGMT  05/13/2017   IR RADIOLOGIST EVAL & MGMT  04/27/2018   IR RADIOLOGIST EVAL & MGMT  07/13/2019   LUMBAR LAMINECTOMY/DECOMPRESSION MICRODISCECTOMY N/A 08/14/2015   Procedure: LUMBAR DECOMPRESSION MICRODISCECTOMY L3-S1;  Surgeon: Melina Schools, MD;  Location: [place];  Service: Orthopedics;  Laterality: N/A;   RADIOFREQUENCY ABLATION Left 02/05/2017   Procedure: LEFT RENAL CRYOABLATION;  Surgeon: Corrie Mckusick, DO;  Location: [facility];  Service: Anesthesiology;  Laterality: Left;   RIGHT HEART CATH N/A 07/20/2017   Procedure: Right Heart Cath;  Surgeon: Nigel Mormon, MD;  Location: [facility];  Service: Cardiovascular;  Laterality: N/A;   RIGHT/LEFT HEART CATH AND CORONARY ANGIOGRAPHY N/A 06/11/2017   Procedure: Right/Left Heart Cath and Coronary Angiography;  Surgeon: Dixie Dials, MD;  Location: [facility];  Service: Cardiovascular;  Laterality: N/A;   TONSILLECTOMY AND ADENOIDECTOMY       Home Medications:  Prior to Admission medications   Medication Sig Start Date End Date Taking? Authorizing Provider  acetaminophen (TYLENOL) 650 MG CR tablet Take 1,300 mg by mouth See admin instructions. Take 2 tablets (1300 mg) by  mouth every morning, make take 2 tablets (1300 mg) at night as needed for pain   Yes [provider]  albuterol (PROVENTIL) (2.5 MG/3ML) 0.083% nebulizer solution Take 2.5 mg by nebulization every 4 (four) hours as needed for wheezing or shortness of breath.    Yes [provider]  ARIPiprazole (ABILIFY) 20 MG tablet Take 1 tablet (20 mg  total) by mouth at bedtime. 08/27/14  Yes Kerrie Buffalo, NP  ASPERCREME LIDOCAINE EX Apply 1 application topically 2 (two) times daily as needed (back pain).   Yes [provider]  aspirin EC 81 MG EC tablet Take 1 tablet (81 mg total) by mouth daily. 06/15/17  Yes Regalado, Belkys A, MD  atorvastatin (LIPITOR) 20 MG tablet Take 20 mg by mouth daily after supper.   Yes [provider]  bisacodyl (DULCOLAX) 10 MG suppository Place 1 suppository (10 mg total) rectally daily as needed for moderate constipation. 09/08/16  Yes Vann, Jessica U, DO  brimonidine (ALPHAGAN) 0.2 % ophthalmic solution Place 1 drop into both eyes 2 (two) times daily. 02/09/19  Yes [provider]  budesonide-formoterol (SYMBICORT) 160-4.5 MCG/ACT inhaler Inhale 2 puffs into the lungs 2 (two) times daily. 01/23/19  Yes Tanda Rockers, MD  donepezil (ARICEPT) 5 MG tablet Take 1 tablet (5 mg total) by mouth at bedtime. 08/27/14  Yes Kerrie Buffalo, NP  dorzolamide-timolol (COSOPT) 22.3-6.8 MG/ML ophthalmic solution Place 1 drop into both eyes 2 (two) times daily.   Yes [provider]  ferrous sulfate 325 (65 FE) MG tablet Take 325 mg by mouth 2 (two) times daily with a meal.    Yes [provider]  furosemide (LASIX) 80 MG tablet Take 1 tablet (80 mg total) by mouth daily. Patient taking differently: Take 40 mg by mouth 2 (two) times daily.  01/12/19  Yes Shelly Coss, MD  glimepiride (AMARYL) 4 MG tablet Take 0.5 tablets (2 mg total) by mouth daily with breakfast. 06/14/17  Yes Regalado, Belkys A, MD  Ipratropium-Albuterol (COMBIVENT  RESPIMAT) 20-100 MCG/ACT AERS respimat Inhale 1-2 puffs into the lungs every 4 (four) hours as needed for wheezing.    Yes [provider]  lamoTRIgine (LAMICTAL) 100 MG tablet Take 1 tablet (100 mg total) by mouth every evening. Patient taking differently: Take 100 mg by mouth daily after supper.  08/27/14  Yes Kerrie Buffalo, NP  latanoprost (XALATAN) 0.005 % ophthalmic solution Place 1 drop into both eyes at bedtime. 08/27/14  Yes Kerrie Buffalo, NP  Magnesium 250 MG TABS Take 250 mg by mouth See admin instructions. Take 1 tablet (250 mg) by mouth every other day after supper   Yes [provider]  Menthol, Topical Analgesic, (BIOFREEZE EX) Apply 1 application topically 2 (two) times daily as needed (back pain).   Yes [provider]  metFORMIN (GLUCOPHAGE) 1000 MG tablet Take 500 mg by mouth 2 (two) times daily with a meal.   Yes [provider]  OXYGEN Inhale 8 L into the lungs continuous.   Yes [provider]  Phenylephrine-APAP-Guaifenesin (MUCINEX SINUS-MAX) 10-650-400 MG/20ML LIQD Take 20 mLs by mouth as needed (cough).    Yes [provider]  polyethylene glycol (MIRALAX / GLYCOLAX) packet Take 17 g by mouth daily. Patient taking differently: Take 17 g by mouth daily as needed for mild constipation, moderate constipation or severe constipation. Mix in 4-8 oz liquid and drink 09/08/16  Yes Vann, Jessica U, DO  PRESCRIPTION MEDICATION Inhale into the lungs 2 (two) times daily. Trilogy Machine   Yes [provider]  Selenium Sulf-Pyrithione-Urea 2.25 % SHAM Apply 1 application topically See admin instructions. Apply topically to scalp for dermatitis once every 14 days 01/25/15  Yes [provider]  senna (SENOKOT) 8.6 MG tablet Take 1 tablet by mouth daily as needed for constipation.    Yes [provider]  sildenafil (REVATIO) 20  MG tablet Take 1 tablet (20 mg total) by mouth 3 (three) times daily. 06/14/17  Yes  Regalado, Belkys A, MD  spironolactone (ALDACTONE) 25 MG tablet Take 25 mg by mouth daily at 12 noon. 12 pm   Yes [provider]  glucose blood (ACCU-CHEK AVIVA) test strip 1 each by Other route 2 (two) times daily. 08/04/16   Jearld Fenton, NP  Respiratory Therapy Supplies (FLUTTER) DEVI Use as directed. 05/26/17   Parrett, Fonnie Mu, NP    Inpatient Medications: Scheduled Meds:  ARIPiprazole  20 mg Oral QHS   aspirin EC  81 mg Oral Daily   brimonidine  1 drop Both Eyes BID   Chlorhexidine Gluconate Cloth  6 each Topical Q0600   donepezil  5 mg Oral QHS   dorzolamide-timolol  1 drop Both Eyes BID   enoxaparin (LOVENOX) injection  30 mg Subcutaneous Q24H   ferrous sulfate  325 mg Oral Q breakfast   furosemide  80 mg Intravenous TID   insulin aspart  0-9 Units Subcutaneous TID WC   lamoTRIgine  100 mg Oral QPM   latanoprost  1 drop Both Eyes QHS   mouth rinse  15 mL Mouth Rinse BID   metolazone  5 mg Oral QODAY   mometasone-formoterol  2 puff Inhalation BID   mupirocin ointment  1 application Nasal BID   potassium chloride  10 mEq Oral BID   sildenafil  20 mg Oral TID   sodium chloride flush  3 mL Intravenous Q12H   Continuous Infusions:  PRN Meds: acetaminophen **OR** acetaminophen, albuterol, guaiFENesin, ipratropium-albuterol, polyethylene glycol  Allergies:    Allergies  Allergen Reactions   Codeine Nausea And Vomiting   Nitrostat [Nitroglycerin] Other (See Comments)    Pt is on revatio   Prednisone Other (See Comments)    Has to monitor because she is a diabetic     Social History:   Social History   Socioeconomic History   Marital status: Divorced    Spouse name: Not on file   Number of children: 1   Years of education: college 4   Highest education level: Not on file  Occupational History   Occupation: disabled  Scientist, product/process development strain: Not on file   Food insecurity    Worry: Not on file     Inability: Not on file   Transportation needs    Medical: Not on file    Non-medical: Not on file  Tobacco Use   Smoking status: Former Smoker    Packs/day: 1.00    Years: 28.00    Pack years: 28.00    Types: Cigarettes    Quit date: 07/15/2015    Years since quitting: 4.0   Smokeless tobacco: Never Used  Substance and Sexual Activity   Alcohol use: No    Alcohol/week: 0.0 standard drinks   Drug use: No   Sexual activity: Not Currently    Birth control/protection: None  Lifestyle   Physical activity    Days per week: Not on file    Minutes per session: Not on file   Stress: Not on file  Relationships   Social connections    Talks on phone: Not on file    Gets together: Not on file    Attends religious service: Not on file    Active member of club or organization: Not on file    Attends meetings of clubs or organizations: Not on file    Relationship status: Not on  file   Intimate partner violence    Fear of current or ex partner: Not on file    Emotionally abused: Not on file    Physically abused: Not on file    Forced sexual activity: Not on file  Other Topics Concern   Not on file  Social History Narrative   Patient is right handed.   Patient drinks 2 cups caffeine daily.    Family History:    Family History  Problem Relation Age of Onset   Dementia Mother    Alzheimer's disease Mother    Heart disease Mother    Hypertension Mother    Diabetes Mother    Diabetes Father    Alzheimer's disease Sister    Heart disease Brother    Heart attack Brother    Bladder Cancer Neg Hx    Kidney cancer Neg Hx    Prostate cancer Neg Hx      ROS:  Please see the history of present illness.  General:no colds or fevers, no weight changes Skin:no rashes or ulcers HEENT:no blurred vision, no congestion CV:see HPI PUL:see HPI GI:no diarrhea constipation or melena, no indigestion GU:no hematuria, no dysuria MS:+ arthritis, no claudication Neuro:no  syncope, no lightheadedness, memory issues Endo:+ diabetes, no thyroid disease Psych with bipolar disorder   All other ROS reviewed and negative.     Physical Exam/Data:   Vitals:   07/21/19 0500 07/21/19 0728 07/21/19 0802 07/21/19 1115  BP:    105/66  Pulse:    83  Resp:    16  Temp:  97.9 F (36.6 C)  98 F (36.7 C)  TempSrc:  Oral  Oral  SpO2:  90% 93% 94%  Weight: 99 kg     Height:        Intake/Output Summary (Last 24 hours) at 07/21/2019 1541 Last data filed at 07/21/2019 1315 Gross per 24 hour  Intake 603 ml  Output 1400 ml  Net -797 ml   Last 3 Weights 07/21/2019 07/20/2019 07/19/2019  Weight (lbs) 218 lb 4.1 oz 228 lb 2.8 oz 184 lb  Weight (kg) 99 kg 103.5 kg 83.462 kg  Some encounter information is confidential and restricted. Go to Review Flowsheets activity to see all data.     Body mass index is 38.66 kg/m.  General:  Well nourished,  in no acute distress if resting  HEENT: normal Lymph: no adenopathy Neck: + JVD Endocrine:  No thryomegaly Vascular: No carotid bruits; pedal pulses 1+ bilaterally Cardiac:  normal S1, S2; RRR; no murmur gallup rub or click Lungs:  diminished to auscultation bilaterally, occ wheeze, no rhonchi or rales  Abd: soft, nontender, no hepatomegaly  Ext: 2+ lower ext edema but 3-4+ in back of thighs and into hips and back.    Musculoskeletal:  No deformities, BUE and BLE strength weak and equal Skin: warm and dry  Neuro:  Answers questions approp, follows commands, no focal abnormalities noted Psych:  Normal affect   Relevant CV Studies:  Echocardiogram 01/07/2019: - Left ventricle: The cavity size was normal. Wall thickness was  increased in a pattern of mild LVH. Systolic function was normal.  The estimated ejection fraction was in the range of 55% to 60%.  Wall motion was normal; there were no regional wall motion  abnormalities. Doppler parameters are consistent with abnormal  left ventricular relaxation (grade 1 diastolic  dysfunction).  Doppler parameters are consistent with high ventricular filling  pressure. - Right ventricle: Systolic function was moderately reduced.  ASSESSMENT AND PLAN:  1.  Acute cor pulmonale secondary to underlying severe COPD  2.  Acute on chronic diastolic heart failure, LV 3.  Acute on chronic systolic heart failure, RV secondary to #1. 4.  Anasarca secondary to #1 and 2 5.  Severe pulmonary hypertension secondary to group 2 and 3 WHO classification.  Cardiac cath Lt 06/11/17   Left Main  LM lesion 30% stenosed  Not the culprit lesion. The lesion is type A and eccentric. The lesion was not previously treated. The stenosis was measured by a visual reading. Pressure wire/FFR was not performed on the lesion. IVUS was not performed on the lesion.  Left Anterior Descending  Ost LAD to Dist LAD lesion 25% stenosed  Not the culprit lesion. The lesion is type C and irregular. The lesion was not previously treated. The stenosis was measured by a visual reading. Pressure wire/FFR was not performed on the lesion. IVUS was not performed on the lesion.  Left Circumflex  Ost Cx to Dist Cx lesion 30% stenosed  Not the culprit lesion. The lesion is type C, segmental and eccentric. The lesion was not previously treated. The stenosis was measured by a visual reading. Pressure wire/FFR was not performed on the lesion. IVUS was not performed on the lesion.  Right Coronary Artery  Prox RCA to Dist RCA lesion 25% stenosed  Not the culprit lesion. The lesion is type C, segmental and irregular. The lesion was not previously treated. The stenosis was measured by a visual reading. Pressure wire/FFR was not performed on the lesion. IVUS was not performed on the lesion.  Dist RCA lesion 70% stenosed  Not the culprit lesion. The lesion is type C and eccentric. The lesion was not previously treated. The stenosis was measured by a visual reading. Pressure wire/FFR was not performed on the lesion. IVUS was not  performed on the lesion.  Right Posterior Descending Artery  Ost RPDA lesion 50% stenosed  Not the culprit lesion. The lesion is type A and concentric. The lesion was not previously treated. The stenosis was measured by a visual reading. Pressure wire/FFR was not performed on the lesion. IVUS was not performed on the lesion.  Intervention  No interventions have been documented.  Rt heart cath 06/12/19  Most Recent Value  Fick Cardiac Output 5.51 L/min  Fick Cardiac Output Index 2.74 (L/min)/BSA  Thermal Cardiac Output 6.59 L/min  Thermal Cardiac Output Index 3.28 (L/min)/BSA  RA A Wave 30 mmHg  RA V Wave 30 mmHg  RA Mean 29 mmHg  RV Systolic Pressure 74 mmHg  RV Diastolic Pressure 9 mmHg  RV EDP 20 mmHg  PA Systolic Pressure 77 mmHg  PA Diastolic Pressure 44 mmHg  PA Mean 63 mmHg  PW A Wave 31 mmHg  PW V Wave 30 mmHg  PW Mean 29 mmHg  AO Systolic Pressure 811 mmHg  AO Diastolic Pressure 79 mmHg  AO Mean 99 mmHg  LV Systolic Pressure 914 mmHg  LV Diastolic Pressure 16 mmHg  LV EDP 29 mmHg  AOp Systolic Pressure 782 mmHg  AOp Diastolic Pressure 85 mmHg  AOp Mean Pressure 956 mmHg  LVp Systolic Pressure 213 mmHg  LVp Diastolic Pressure 24 mmHg  LVp EDP Pressure 36 mmHg  TPVR Index 19.21 HRUI  TSVR Index 30.2 HRUI  PVR SVR Ratio 0.5  TPVR/TSVR Ratio 0.64   Right Heart Pressures Hemodynamic findings consistent with severe pulmonary hypertension. Elevated LV EDP consistent with volume overload.     Laboratory Data:  High Sensitivity Troponin:   Recent Labs  Lab 07/19/19 0746 07/19/19 1036 07/19/19 1306  TROPONINIHS 92* 189* 216*     Cardiac EnzymesNo results for input(s): TROPONINI in the last 168 hours. No results for input(s): TROPIPOC in the last 168 hours.  Chemistry Recent Labs  Lab 07/19/19 2037 07/20/19 0232 07/21/19 0245  NA 122* 128* 126*  K 3.9 4.1 3.8  CL 91* 92* 87*  CO2 19* 19* 26  GLUCOSE 126* 90 87  BUN 59* 59* 59*  CREATININE 3.08* 3.07*  3.10*  CALCIUM 8.8* 9.7 9.4  GFRNONAA 15* 15* 15*  GFRAA 17* 17* 17*  ANIONGAP 12 17* 13    Recent Labs  Lab 07/19/19 0746  PROT 6.2*  ALBUMIN 3.3*  AST 39  ALT 22  ALKPHOS 77  BILITOT 1.2   Hematology Recent Labs  Lab 07/19/19 0657 07/19/19 1036  WBC 4.2 4.1  RBC 5.71* 5.86*  HGB 14.5 14.8  HCT 46.7* 48.8*  MCV 81.8 83.3  MCH 25.4* 25.3*  MCHC 31.0 30.3  RDW 16.2* 16.3*  PLT 406* 280   BNP Recent Labs  Lab 07/19/19 0657  BNP 1,147.6*    DDimer No results for input(s): DDIMER in the last 168 hours.   Radiology/Studies:  Dg Chest Portable 1 View  Result Date: 07/19/2019 CLINICAL DATA:  Shortness of breath EXAM: PORTABLE CHEST 1 VIEW COMPARISON:  03/29/2019 FINDINGS: Chronic cardiomegaly and interstitial coarsening. Negative mediastinal contours when accounting for leftward rotation. There is no edema, consolidation, effusion, or pneumothorax. IMPRESSION: No acute finding when compared to prior. Chronic interstitial disease and cardiomegaly. Electronically Signed   By: Monte Fantasia M.D.   On: 07/19/2019 07:05   Vas [place] Lower Extremity Venous (dvt)  Result Date: 07/20/2019  Lower Venous Study Indications: Pain, and Edema.  Limitations: Body habitus and poor ultrasound/tissue interface. Comparison Study: no prior Performing Technologist: Abram Sander RVS  Examination Guidelines: A complete evaluation includes B-mode imaging, spectral Doppler, color Doppler, and power Doppler as needed of all accessible portions of each vessel. Bilateral testing is considered an integral part of a complete examination. Limited examinations for reoccurring indications may be performed as noted.  +---------+---------------+---------+-----------+----------+--------------+  RIGHT     Compressibility Phasicity Spontaneity Properties Summary         +---------+---------------+---------+-----------+----------+--------------+  CFV       Full            Yes       Yes                                     +---------+---------------+---------+-----------+----------+--------------+  SFJ       Full                                                             +---------+---------------+---------+-----------+----------+--------------+  FV Prox   Full                                                             +---------+---------------+---------+-----------+----------+--------------+  FV Mid  Not visualized  +---------+---------------+---------+-----------+----------+--------------+  FV Distal                                                  Not visualized  +---------+---------------+---------+-----------+----------+--------------+  PFV       Full                                                             +---------+---------------+---------+-----------+----------+--------------+  POP       Full            Yes       Yes                                    +---------+---------------+---------+-----------+----------+--------------+  PTV       Full                                                             +---------+---------------+---------+-----------+----------+--------------+  PERO      Full                                                             +---------+---------------+---------+-----------+----------+--------------+   +---------+---------------+---------+-----------+----------+--------------+  LEFT      Compressibility Phasicity Spontaneity Properties Summary         +---------+---------------+---------+-----------+----------+--------------+  CFV       Full            Yes       Yes                                    +---------+---------------+---------+-----------+----------+--------------+  SFJ       Full                                                             +---------+---------------+---------+-----------+----------+--------------+  FV Prox   Full                                                              +---------+---------------+---------+-----------+----------+--------------+  FV Mid  Not visualized  +---------+---------------+---------+-----------+----------+--------------+  FV Distal                                                  Not visualized  +---------+---------------+---------+-----------+----------+--------------+  PFV       Full                                                             +---------+---------------+---------+-----------+----------+--------------+  POP       Full            Yes       Yes                                    +---------+---------------+---------+-----------+----------+--------------+  PTV       Full                                                             +---------+---------------+---------+-----------+----------+--------------+  PERO      Full                                                             +---------+---------------+---------+-----------+----------+--------------+     *See table(s) above for measurements and observations.    Preliminary     Assessment and Plan:   1. Severe pulmonary hypertension /acute on chronic cor pulmonale with hypoxia - Dr. Haroldine Laws to see for recommendations. She is on diuretics.  Possible higher dose diuretic and milrinone again to defer to Dr. Haroldine Laws.  2. Severe COPD on HF 02 here at 15 L and low sats, and home 02 was 8 L, at home would desat with activity but would return to normal with rest.  3. Hypotension  4. Elevated troponin mild, due to [place] and HF.  Not ACS and cath in 2018 with non obstructive disease.  5. DNR 6. CKD- 4 Cr 3.10 in past 2.10 to 2.44 in 02/2019 and 12/2018 was 1.77 07/2017- not a dialysis candidate per notes.  7. DM per primary team  8. Hyponatremia       For questions or updates, please contact [place] Please consult [email or web address] for contact info under   Signed, Cecilie Kicks, NP  07/21/2019 3:41 PM   Patient seen and examined  with the above-signed Advanced Practice Provider and/or Housestaff. I personally reviewed laboratory data, imaging studies and relevant notes. I independently examined the patient and formulated the important aspects of the plan. I have edited the note to reflect any of my changes or salient points. I have personally discussed the plan with the patient and/or family.  Ms. Raburn is a very complicated 68 y/o woman with multiple medical issues including morbid obesity, chronic respiratory failure with cor  pulmonale due to severe PAH/OHS and COPD on home O2, CKD 4 with baseline creatinine near 2.5-3.0, DM and schizo-affective d/o whom we are asked by Dr. Einar Gip to see due to worsening volume overload and right heart failure.   I have reviewed many of her recent studies including her RHC, echo, PFTs, ECG and previous VQ scan personally. She has been followed very closely by Dr. Einar Gip who has paid very careful attention to her volume status and also has treated her PAH (which is likely combination of WHO Group I/III). She has had several recent hospitalizations for weakness and was actually under Hospice for a time. She is DNR  Currently Ms. Whittier is somewhat lethargic and can't give me much of her history. She can tell me that she is at [facility] and the year is 2020 and that she is not uncomfortable currently but feels her sinuses are full.   Her niece (whom she lives with) is at the bedside and says that Ms. Iribe has hard a difficult time over the past few weeks. At baseline can get around the house with her Rollator and do basic ADLs. However over the past few weeks she has had more swelling and this had made her much weaker and limited her ability to get around. She has been afraid to seek help due to [place] pandemic.   She was admitted after a fall and found to be hypotensive and markedly volume overloaded with creatinine up from 2.5-> 3.0. Diuresis has been sluggish despite IV lasix TID.  On exam  Obese  woman lying in bed. Elderly and frail. Lethargic but will answer questions Anicteric JVP elevated to ear Cor RRR 2/6 TR Lungs clear with mild upper airway wheeze Ab obese NT  Ext 3+ edema into flanks  I had a long talk with Ms. Pitner's niece. She has a clear understanding that she has several end-stage issues but is hopeful for some recovery as her aunt was able to get around the house just 2-3 weeks ago before she had the fluid build up. I affirmed that I felt that Dr. Einar Gip has done all we can do to optimize her PAH and renal function and that there were no "magic bullets" left. That said, I told her that I did feelit was reasonable to try and push her diuresis more aggressively and see if we can possibly get her back to her baseline. However, if this was not possible we would re-entertain the notion of Hospice during this admit.   For now will start lasix gtt at 20/hr, add diamox and wrap lower extremities with UNNA boots. We can titrate these therapies as needed. If no response or renal function continues to deteriorate then we will know that she truly has no options left. There is no role for CRRT or inotropic support with her.   The AHF team will follow along and help manage as best we can.  Glori Bickers, MD  9:32 PM

## 2019-07-22 LAB — BASIC METABOLIC PANEL
Anion gap: 18 — ABNORMAL HIGH (ref 5–15)
BUN: 60 mg/dL — ABNORMAL HIGH (ref 8–23)
CO2: 21 mmol/L — ABNORMAL LOW (ref 22–32)
Calcium: 9.6 mg/dL (ref 8.9–10.3)
Chloride: 89 mmol/L — ABNORMAL LOW (ref 98–111)
Creatinine, Ser: 2.98 mg/dL — ABNORMAL HIGH (ref 0.44–1.00)
GFR calc Af Amer: 18 mL/min — ABNORMAL LOW (ref >60)
GFR calc non Af Amer: 15 mL/min — ABNORMAL LOW (ref >60)
Glucose, Bld: 90 mg/dL (ref 70–99)
Potassium: 4.1 mmol/L (ref 3.5–5.1)
Sodium: 128 mmol/L — ABNORMAL LOW (ref 135–145)

## 2019-07-22 LAB — GLUCOSE, CAPILLARY
Glucose-Capillary: 96 mg/dL (ref 70–99)
Glucose-Capillary: 110 mg/dL — ABNORMAL HIGH (ref 70–99)
Glucose-Capillary: 98 mg/dL (ref 70–99)

## 2019-07-22 NOTE — Progress Notes (Signed)
Subjective: Pt was seen at the bedside this morning. Head of bed elevated for pt to sit up. Alertness slightly improved from yesterday. Pt repeating "I'm feeling much better, I'm feeling much better." Out 1.6L overnight for balance of -1.2L yesterday. On 15L HFNC.  Objective:  Vital signs in last 24 hours: Vitals:   07/21/19 2300 07/22/19 0000 07/22/19 0200 07/22/19 0300  BP: (!) 101/59 (!) 95/59 100/82 (!) 82/64  Pulse: 76 74  85  Resp: _0 Temp: 98.7 F (37.1 C)   97.7 F (36.5 C)  TempSrc: Oral   Oral  SpO2: 96% 96%  94%  Weight:      Height:       Physical Exam Vitals signs reviewed.  Constitutional:      Appearance: She is ill-appearing.  Cardiovascular:     Rate and Rhythm: Normal rate and regular rhythm.     Heart sounds: Heart sounds are distant. No murmur. No friction rub. No gallop.   Pulmonary:     Effort: Prolonged expiration and respiratory distress (Chronic) present.     Breath sounds: Decreased air movement present. No wheezing, rhonchi or rales.  Abdominal:     General: Bowel sounds are normal. There is no distension.     Palpations: Abdomen is soft.     Tenderness: There is no abdominal tenderness.  Musculoskeletal:     Right lower leg: 3+ Pitting Edema present.     Left lower leg: 3+ Pitting Edema present.     Comments: LE pitting improved from yesterday. Unna boots in place.  Neurological:     Mental Status: She is alert. Mental status is at baseline.      Assessment/Plan: Assessment - Elizabeth Thompson is a 68 year old F with significant PMH of end stage heart failure and COPD, CKD stage 3/4, diabetes, OSA, and memory disorder who presented to the ED due to volume overload and a fall at home. This appears to be an acute worsening of the chronic progression of her end-stage HF, COPD, and CKD.  Acute on Chronic Diastolic Heart Failure with Pulmonary Hypertension Pt presented to the ED with volume overload and elevated BNP. With anasarca on exam and  minimal new dyspnea, current admission most consistent with exacerbation of right sided heart failure. Echo in Jan showed LV EF 55-60%, G1DD, moderately reduced RV EF, PA peak pressure 35mHg. Per outpatient cardiology notes, pt's condition at advanced stage. Did begin following with Palliative Care outpatient after admission in April.  Does not appear she was ever in Hospice based on report from pt's niece, who is her healthcare POA and caregiver.  Cardiology consulted, appreciate recommendations  - reached out to advanced CHF team for opinion  - add metolazone [id number] other day Heart Failure team consulted, appreciate recommendations  - start lasix infusion at 2065mr  - add acetazolamide 250m86m BID  - wrap legs with Unna boots  - continue sildenafil  - do not recommend further aggressive management (pressors or inotropes) - strict I/Os (balance of -1.2LmL yesterday) - supplemental oxygen (high flow) to keep O2 saturation >90% - holding home spironolactone in light of soft pressures - follow-up LE doppler results   Hypervolemic Hyponatremia  [name] Na was 127 on admission. Most likely related to patient's HF and resulting volume overload. Per chart review, sodium also low during earlier hospitalizations this year for decompensated HF and respiratory failure. - Na 128 this morning - improvement likely due to fluid output and  acetazolamide therapy - fluid restriction - monitor daily BMP   Cardiorenal syndrome vs. Progression of CKD [name] Cr was 3.07 on admission (prior baseline Cr ~2.3). Rise in Cr attributed to volume overload vs worsening CKD vs cardiorenal syndrome; minimal improvement today with more aggressive diuresis last evening. - Cr 2.98 this morning - increased response to more aggressive diuresis overnight, pt is -1.2L total yesterday  - will continue current diuretic management as above - check daily BMPs - strict I/Os   Acute on Chronic Hypoxic Respiratory Failure due  to COPD Pt noted to have centrolobular emphysema on CT, GOLD stage 0 due to PFTs not meeting criteria. Multiple hospitalizations due to COPD and respiratory failure. Is on 7-8L O2 at home. Niece reports desaturations at home that improve with repositioning and CPAP machine use.  - pt continues to be on high flow today, will monitor if respiratory status improves at all with diuresis - continue home inhalers  - dulera 2 puffs BID  - albuterol 2.6m q4h PRN  - combivent 1-2 puffs q4h PRN - duonebs q4h PRN - home trilogy machine used as CPAP - supplemental O2 (high flow) to keep saturation >90%   Diabetes Pt takes glimepiride and metformin at home.  - SSI  Diet - Heart healthy with fluid restriction (can change to soft or liquid if niece is unable to bring in pt's dentures) Fluids - none DVT ppx - lovenox 323msubQ daily CODE STATUS - DNR   Dispo: Anticipated discharge pending continued diuretic response.   Elizabeth HornsMD 07/22/2019, 7:02 AM Pager: [phone]

## 2019-07-22 NOTE — Progress Notes (Addendum)
Advanced Heart Failure Rounding Note   Subjective:    Much more alert and interactive this am. Denies CP or SOB. No orthopnea or PND.   UNNA boots placed yesterday and started on lasix gtt. Says u/o has really picked up. Down 3 pounds overnight.    Creatinine slightly improved   Objective:   Weight Range:  Vital Signs:   Temp:  [97.5 F (36.4 C)-98.7 F (37.1 C)] 97.7 F (36.5 C) (08/08 0747) Pulse Rate:  [74-91] 91 (08/08 0747) Resp:  [11-24] 24 (08/08 0747) BP: (82-128)/(56-83) 116/77 (08/08 0747) SpO2:  [89 %-96 %] 90 % (08/08 0856) Weight:  [97.9 kg] 97.9 kg (08/08 0623) Last BM Date: 07/17/19  Weight change: Filed Weights   07/20/19 0325 07/21/19 0500 07/22/19 0623  Weight: 103.5 kg 99 kg 97.9 kg    Intake/Output:   Intake/Output Summary (Last 24 hours) at 07/22/2019 0937 Last data filed at 07/22/2019 0093 Gross per 24 hour  Intake 757.64 ml  Output 2100 ml  Net -1342.36 ml     Physical Exam: General:  Elderly frail appearing. No resp difficulty HEENT: normal Neck: supple. JVP to ear . Carotids 2+ bilat; no bruits. No lymphadenopathy or thryomegaly appreciated. Cor: PMI nondisplaced. Regular rate & rhythm. No rubs, gallops or murmurs. Lungs: clear Abdomen: obese soft, nontender, nondistended. No hepatosplenomegaly. No bruits or masses. Good bowel sounds. Extremities: no cyanosis, clubbing, rash, 2-3+ edema into flanks. +unna boots Neuro: alert & orientedx3, cranial nerves grossly intact. moves all 4 extremities w/o difficulty. Affect pleasant  Telemetry: NSR 80s Personally reviewed   Labs: Basic Metabolic Panel: Recent Labs  Lab 07/19/19 0657 07/19/19 1036 07/19/19 2037 07/20/19 0232 07/21/19 0245 07/22/19 0241  NA 127*  --  122* 128* 126* 128*  K 4.0  --  3.9 4.1 3.8 4.1  CL 92*  --  91* 92* 87* 89*  CO2 22  --  19* 19* 26 21*  GLUCOSE 78  --  126* 90 87 90  BUN 54*  --  59* 59* 59* 60*  CREATININE 3.07* 2.89* 3.08* 3.07* 3.10* 2.98*    CALCIUM 9.8  --  8.8* 9.7 9.4 9.6  MG  --  2.5*  --   --   --   --     Liver Function Tests: Recent Labs  Lab 07/19/19 0746  AST 39  ALT 22  ALKPHOS 77  BILITOT 1.2  PROT 6.2*  ALBUMIN 3.3*   No results for input(s): LIPASE, AMYLASE in the last 168 hours. No results for input(s): AMMONIA in the last 168 hours.  CBC: Recent Labs  Lab 07/19/19 0657 07/19/19 1036  WBC 4.2 4.1  HGB 14.5 14.8  HCT 46.7* 48.8*  MCV 81.8 83.3  PLT 406* 280    Cardiac Enzymes: No results for input(s): CKTOTAL, CKMB, CKMBINDEX, TROPONINI in the last 168 hours.  BNP: BNP (last 3 results) Recent Labs    02/26/19 0632 03/27/19 1531 07/19/19 0657  BNP 547.8* 593.2* 1,147.6*    ProBNP (last 3 results) No results for input(s): PROBNP in the last 8760 hours.    Other results:  Imaging: Vas [place] Lower Extremity Venous (dvt)  Result Date: 07/20/2019  Lower Venous Study Indications: Pain, and Edema.  Limitations: Body habitus and poor ultrasound/tissue interface. Comparison Study: no prior Performing Technologist: Abram Sander RVS  Examination Guidelines: A complete evaluation includes B-mode imaging, spectral Doppler, color Doppler, and power Doppler as needed of all accessible portions of each vessel. Bilateral testing  is considered an integral part of a complete examination. Limited examinations for reoccurring indications may be performed as noted.  +---------+---------------+---------+-----------+----------+--------------+  RIGHT     Compressibility Phasicity Spontaneity Properties Summary         +---------+---------------+---------+-----------+----------+--------------+  CFV       Full            Yes       Yes                                    +---------+---------------+---------+-----------+----------+--------------+  SFJ       Full                                                             +---------+---------------+---------+-----------+----------+--------------+  FV Prox   Full                                                              +---------+---------------+---------+-----------+----------+--------------+  FV Mid                                                     Not visualized  +---------+---------------+---------+-----------+----------+--------------+  FV Distal                                                  Not visualized  +---------+---------------+---------+-----------+----------+--------------+  PFV       Full                                                             +---------+---------------+---------+-----------+----------+--------------+  POP       Full            Yes       Yes                                    +---------+---------------+---------+-----------+----------+--------------+  PTV       Full                                                             +---------+---------------+---------+-----------+----------+--------------+  PERO      Full                                                             +---------+---------------+---------+-----------+----------+--------------+   +---------+---------------+---------+-----------+----------+--------------+  LEFT      Compressibility Phasicity Spontaneity Properties Summary         +---------+---------------+---------+-----------+----------+--------------+  CFV       Full            Yes       Yes                                    +---------+---------------+---------+-----------+----------+--------------+  SFJ       Full                                                             +---------+---------------+---------+-----------+----------+--------------+  FV Prox   Full                                                             +---------+---------------+---------+-----------+----------+--------------+  FV Mid                                                     Not visualized  +---------+---------------+---------+-----------+----------+--------------+  FV Distal                                                  Not  visualized  +---------+---------------+---------+-----------+----------+--------------+  PFV       Full                                                             +---------+---------------+---------+-----------+----------+--------------+  POP       Full            Yes       Yes                                    +---------+---------------+---------+-----------+----------+--------------+  PTV       Full                                                             +---------+---------------+---------+-----------+----------+--------------+  PERO      Full                                                             +---------+---------------+---------+-----------+----------+--------------+     *  See table(s) above for measurements and observations.    Preliminary       Medications:     Scheduled Medications:  acetaZOLAMIDE  250 mg Oral BID   ARIPiprazole  20 mg Oral QHS   aspirin EC  81 mg Oral Daily   brimonidine  1 drop Both Eyes BID   Chlorhexidine Gluconate Cloth  6 each Topical Q0600   donepezil  5 mg Oral QHS   dorzolamide-timolol  1 drop Both Eyes BID   enoxaparin (LOVENOX) injection  30 mg Subcutaneous Q24H   ferrous sulfate  325 mg Oral Q breakfast   insulin aspart  0-9 Units Subcutaneous TID WC   lamoTRIgine  100 mg Oral QPM   latanoprost  1 drop Both Eyes QHS   mouth rinse  15 mL Mouth Rinse BID   metolazone  5 mg Oral QODAY   mometasone-formoterol  2 puff Inhalation BID   mupirocin ointment  1 application Nasal BID   potassium chloride  10 mEq Oral BID   sildenafil  20 mg Oral TID   sodium chloride flush  3 mL Intravenous Q12H     Infusions:  furosemide (LASIX) infusion 20 mg/hr (07/22/19 0637)     PRN Medications:  acetaminophen **OR** acetaminophen, albuterol, guaiFENesin, ipratropium-albuterol, polyethylene glycol   Assessment/Plan:   1. Acute on chronic diastolic HF R>>L with massive volume overload - volume status improving on lasix gtt abe  actezolamide - Continue current regimen - Watch renal function and electrolytes  2. Severe PAH with a/c cor pulmonale - likely combination of WHO Groups I/II/III. Suspect OHS playing significant role.  - on sildenafil. Would continue  3. Acute on CKD 4 - baseline creatinine 2.5-3.0. Peaked at 3.1 - now down to 2.98 - hopefully will continue to improved with volume removal   4. Acute on chronic hypoxic respiratory failure - multifactorial on 8L at home - now on 15 HFNC - hopefully will improve with diuresis  5. FTT - due to longstanding chronic illness  6. Hyponatremia - limit free water - can [place] tolvaptan if Na < 125  7. DNR/DNI  She is improved this am. Still markedly volume overloaded with likely 25-30 pounds of volume to go. Continue lasix gtt. Re-evaluate on daily basis. Goal is to get fluid off and keep renal function stable. Not candidate for home inotropes or other advanced therapies.    Length of Stay: 3   Glori Bickers MD 07/22/2019, 9:37 AM  Advanced Heart Failure Team Pager [phone] (M-F; 7a - 4p)  Please contact [facility] for night-coverage after hours (4p -7a ) and weekends on [email or web address]

## 2019-07-22 NOTE — Progress Notes (Signed)
Unna boots applied by ortho tech this evening. Patient tolerated well with no complaints. Will continue to monitor.

## 2019-07-22 NOTE — Progress Notes (Signed)
Internal Medicine Attending:   I saw and examined the patient. I reviewed the resident's note and I agree with the resident's findings and plan as documented in the resident's note. Patient reports improvement with medication changes made last night.  She is having some increased urine output on Lasix drip.  On examination she is mildly tachypneic she has using supplemental oxygen via nasal cannula.  She has elevated JVD lungs were grossly clear my examination, she has lower extremity edema currently with Unna boots in place. Appreciate heart failure's input-continue diureseing.

## 2019-07-23 DIAGNOSIS — Z515 Encounter for palliative care: Secondary | ICD-10-CM

## 2019-07-23 DIAGNOSIS — W19XXXA Unspecified fall, initial encounter: Secondary | ICD-10-CM

## 2019-07-23 LAB — BLOOD GAS, ARTERIAL
Acid-Base Excess: 1 mmol/L (ref 0.0–2.0)
Bicarbonate: 25.8 mmol/L (ref 20.0–28.0)
Drawn by: 535271
O2 Content: 14 L/min
O2 Saturation: 88.2 %
Patient temperature: 98.1
pCO2 arterial: 46.3 mmHg (ref 32.0–48.0)
pH, Arterial: 7.363 (ref 7.350–7.450)
pO2, Arterial: 56.4 mmHg — ABNORMAL LOW (ref 83.0–108.0)

## 2019-07-23 LAB — GLUCOSE, CAPILLARY
Glucose-Capillary: 105 mg/dL — ABNORMAL HIGH (ref 70–99)
Glucose-Capillary: 95 mg/dL (ref 70–99)
Glucose-Capillary: 78 mg/dL (ref 70–99)
Glucose-Capillary: 102 mg/dL — ABNORMAL HIGH (ref 70–99)
Glucose-Capillary: 195 mg/dL — ABNORMAL HIGH (ref 70–99)

## 2019-07-23 LAB — BASIC METABOLIC PANEL
Anion gap: 19 — ABNORMAL HIGH (ref 5–15)
BUN: 62 mg/dL — ABNORMAL HIGH (ref 8–23)
CO2: 19 mmol/L — ABNORMAL LOW (ref 22–32)
Calcium: 9.3 mg/dL (ref 8.9–10.3)
Chloride: 89 mmol/L — ABNORMAL LOW (ref 98–111)
Creatinine, Ser: 2.85 mg/dL — ABNORMAL HIGH (ref 0.44–1.00)
GFR calc Af Amer: 19 mL/min — ABNORMAL LOW (ref >60)
GFR calc non Af Amer: 16 mL/min — ABNORMAL LOW (ref >60)
Glucose, Bld: 112 mg/dL — ABNORMAL HIGH (ref 70–99)
Potassium: 3.7 mmol/L (ref 3.5–5.1)
Sodium: 127 mmol/L — ABNORMAL LOW (ref 135–145)

## 2019-07-23 LAB — CBC
HCT: 43.6 % (ref 36.0–46.0)
Hemoglobin: 13.8 g/dL (ref 12.0–15.0)
MCH: 25.5 pg — ABNORMAL LOW (ref 26.0–34.0)
MCHC: 31.7 g/dL (ref 30.0–36.0)
MCV: 80.6 fL (ref 80.0–100.0)
Platelets: 371 10*3/uL (ref 150–400)
RBC: 5.41 MIL/uL — ABNORMAL HIGH (ref 3.87–5.11)
RDW: 16.1 % — ABNORMAL HIGH (ref 11.5–15.5)
WBC: 4.5 10*3/uL (ref 4.0–10.5)
nRBC: 0 % (ref 0.0–0.2)

## 2019-07-23 LAB — AMMONIA: Ammonia: 26 umol/L (ref 9–35)

## 2019-07-23 MED ORDER — SALINE SPRAY 0.65 % NA SOLN
1.0000 | NASAL | Status: DC | PRN
  Administered 2019-07-23 – 2019-07-31 (×3): 1 via NASAL
  Filled 2019-07-23: qty 44

## 2019-07-23 MED ORDER — METOLAZONE 5 MG PO TABS
5.0000 mg | ORAL_TABLET | Freq: Once | ORAL | Status: AC
  Administered 2019-07-23: 07:00:00 5 mg via ORAL
  Filled 2019-07-23: qty 1

## 2019-07-23 MED ORDER — DEXTROSE 50 % IV SOLN
INTRAVENOUS | Status: AC
  Administered 2019-07-23: 25 mL
  Filled 2019-07-23: qty 50

## 2019-07-23 NOTE — Progress Notes (Signed)
Paged that patient was unresponsive. Presented to bedside. Niece present. Niece states that the patient's mental status is stable compared to days prior. She is unresponsive for [place] to both verbal and tactile stimuli. Cardiac exam with distant heart sounds but RRR, no murmurs/rubs. Diminished breath sounds bilaterally (could only auscultate anterior lung fields). Patient is warm to touch although she does have a narrow pulse pressure.   CBG within normal limits. No sedating medications given recently. No evidence of infection. ABG with metabolic acidosis and borderline hypoxia (pO2 of 56 correlates with SaO2 of approximately 88-90%). Will continue to monitor for now.  Shortly after leaving was paged back to bedside as [name] is requesting a palliative care consult. 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 Will place consult.

## 2019-07-23 NOTE — Progress Notes (Signed)
Presented to the bedside to re-evaluate patient. She is now more alert and opens her eyes to her name. She responds to simple questions and follows simple commands. [name] is at bedside.   The subject of obtaining a CT head was raised earlier. We discussed that we could pursue a CT head and the possible outcomes of doing so. We discussed that it is encouraging the patient is waking up more and that she had an eventful night last night.   We will monitor overnight and if the patient's mental status does not improve can discuss a CT head further at that point.

## 2019-07-23 NOTE — Progress Notes (Signed)
Subjective: Pt was seen at the bedside this morning. Out 1.2L overnight for balance of -747m yesterday. On 15L HFNC. Pt somnolent this morning. Nursing reported patient had a bath overnight and bowel movement which required an additional bath, so it is reasonable that she is drowsy this AM.  Objective:  Vital signs in last 24 hours: Vitals:   07/22/19 2300 07/23/19 0154 07/23/19 0300 07/23/19 0601  BP: 113/73  106/89   Pulse: 91 89 100 88  Resp: (!) _0 Temp: 98.2 F (36.8 C)  98.4 F (36.9 C)   TempSrc: Oral  Oral   SpO2: 90% 92% 100% (!) 86%  Weight:    97.7 kg  Height:       Physical Exam Vitals signs reviewed.  Constitutional:      General: She is sleeping.     Appearance: She is ill-appearing.  Cardiovascular:     Rate and Rhythm: Normal rate and regular rhythm.     Heart sounds: Heart sounds are distant. No murmur. No friction rub. No gallop.   Pulmonary:     Effort: Respiratory distress (chronic) present.     Breath sounds: Decreased air movement present. No wheezing or rales.     Comments: Occasional rhonchi and transmitted upper airway sounds Abdominal:     General: There is no distension.     Palpations: Abdomen is soft.     Tenderness: There is no abdominal tenderness.  Musculoskeletal:     Right lower leg: 3+ Edema present.     Left lower leg: 3+ Edema present.     Comments: Unna boots in place. Edema markedly improved since admission. Still pitting to upper thigh.  Neurological:     Mental Status: She is lethargic.      Assessment/Plan: Assessment - Ms. Lemieux is a 68year old F with significant PMH of end stage heart failure and COPD, CKD stage 3/4, diabetes, OSA, and memory disorder who presented to the ED due to volume overload and a fall at home. This appears to be an acute worsening of the chronic progression of her end-stage HF, COPD, and CKD.  Acute on Chronic Diastolic Heart Failure with Pulmonary Hypertension Pt presented to the ED  with volume overload and elevated BNP. With anasarca on exam and minimal new dyspnea, current admission most consistent with exacerbation of right sided heart failure. Per outpatient cardiology notes, pt's condition at advanced stage. Did begin following with Palliative Care outpatient after admission in April based on report from pt's niece, who is her healthcare POA and caregiver.  Cardiology consulted on admission  - reached out to advanced CHF team for opinion Heart Failure team consulted, appreciate recommendations Severe PAH likely WHO Groups I/II/III  - continue lasix infusion at 245mhr  - acetazolamide 25059mO BID  - add one dose 5mg4mtolazone today  - wrap legs with Unna boots  - continue sildenafil  - do not recommend further aggressive management (pressors or inotropes) - strict I/Os (balance of -782LmL yesterday; daily wt unchanged 97.7kg) - supplemental oxygen (high flow) to keep O2 saturation >90% - holding home spironolactone in light of soft pressures - follow-up LE doppler results; low suspicion for DVT with pt's continued clinical improvement    Hypervolemic Hyponatremia  [name] Na was 127 on admission. Most likely related to patient's HF and resulting volume overload. Per chart review, sodium also low during earlier hospitalizations this year for decompensated HF and respiratory failure. - Na 127 this morning  -  continue with fluid restriction - monitor daily BMP - can use tolvaptan if Na <125  Cardiorenal syndrome - Cr improving with continued diuresis   [name] Cr was 3.07 on admission (prior baseline Cr ~2.3). Rise in Cr attributed to volume overload vs worsening CKD vs cardiorenal syndrome;  - Cr improving to 2.85 this morning with further diuresis - will continue current diuretic management as above - check daily BMPs - strict I/Os   Acute on Chronic Hypoxic Respiratory Failure due to COPD Pt noted to have centrolobular emphysema on CT, GOLD stage 0 due to PFTs  not meeting criteria. Multiple hospitalizations due to COPD and respiratory failure. Is on 7-8L O2 at home. Niece reports desaturations at home that improve with repositioning and CPAP machine use.  - pt continues to be on high flow today, will continue to monitor if respiratory status improves at all with continued diuresis - continue home inhalers  - dulera 2 puffs BID  - albuterol 2.61m q4h PRN  - combivent 1-2 puffs q4h PRN - duonebs q4h PRN - home trilogy machine used as CPAP - supplemental O2 (high flow) to keep saturation >90%   Diabetes Pt takes glimepiride and metformin at home.  - SSI  Diet - Heart healthy with fluid restriction (can change to soft or liquid if niece is unable to bring in pt's dentures) Fluids - none DVT ppx - lovenox [id number] daily CODE STATUS - DNR   Dispo: Anticipated discharge pending based of pt's continued need for aggressive diuresis.   JoLadona HornsMD 07/23/2019, 6:09 AM Pager: [phone]

## 2019-07-23 NOTE — Progress Notes (Signed)
Advanced Heart Failure Rounding Note   Subjective:    Resting comfortably. Denies CP or SOB. Says she is peeing a lot. I/O negative 780cc. Weight unchanged however.    Creatinine continues to improve   Objective:   Weight Range:  Vital Signs:   Temp:  [97.4 F (36.3 C)-98.4 F (36.9 C)] 98.4 F (36.9 C) (08/09 0300) Pulse Rate:  [87-100] 88 (08/09 0601) Resp:  [10-24] 12 (08/09 0601) BP: (89-116)/(55-89) 106/89 (08/09 0300) SpO2:  [86 %-100 %] 86 % (08/09 0601) Weight:  [97.7 kg-97.9 kg] 97.7 kg (08/09 0601) Last BM Date: 07/17/19  Weight change: Filed Weights   07/21/19 0500 07/22/19 0623 07/23/19 0601  Weight: 99 kg 97.9 kg 97.7 kg    Intake/Output:   Intake/Output Summary (Last 24 hours) at 07/23/2019 0602 Last data filed at 07/23/2019 0358 Gross per 24 hour  Intake 1145.32 ml  Output 2100 ml  Net -954.68 ml     Physical Exam: General:  Elderly frail appearing. No resp difficulty HEENT: normal Neck: supple. JVP to ear. Carotids 2+ bilat; no bruits. No lymphadenopathy or thryomegaly appreciated. Cor: PMI nondisplaced. Regular rate & rhythm. No rubs, gallops or murmurs. Lungs: clear Abdomen: obese soft, nontender, nondistended. No hepatosplenomegaly. No bruits or masses. Good bowel sounds. Extremities: no cyanosis, clubbing, rash, 1-2+ edema + UNNA boots Neuro: alert & orientedx3, cranial nerves grossly intact. moves all 4 extremities w/o difficulty. Affect pleasant   Telemetry: NSR 80-90s Personally reviewed   Labs: Basic Metabolic Panel: Recent Labs  Lab 07/19/19 1036 07/19/19 2037 07/20/19 0232 07/21/19 0245 07/22/19 0241 07/23/19 0240  NA  --  122* 128* 126* 128* 127*  K  --  3.9 4.1 3.8 4.1 3.7  CL  --  91* 92* 87* 89* 89*  CO2  --  19* 19* 26 21* 19*  GLUCOSE  --  126* 90 87 90 112*  BUN  --  59* 59* 59* 60* 62*  CREATININE 2.89* 3.08* 3.07* 3.10* 2.98* 2.85*  CALCIUM  --  8.8* 9.7 9.4 9.6 9.3  MG 2.5*  --   --   --   --   --      Liver Function Tests: Recent Labs  Lab 07/19/19 0746  AST 39  ALT 22  ALKPHOS 77  BILITOT 1.2  PROT 6.2*  ALBUMIN 3.3*   No results for input(s): LIPASE, AMYLASE in the last 168 hours. No results for input(s): AMMONIA in the last 168 hours.  CBC: Recent Labs  Lab 07/19/19 0657 07/19/19 1036  WBC 4.2 4.1  HGB 14.5 14.8  HCT 46.7* 48.8*  MCV 81.8 83.3  PLT 406* 280    Cardiac Enzymes: No results for input(s): CKTOTAL, CKMB, CKMBINDEX, TROPONINI in the last 168 hours.  BNP: BNP (last 3 results) Recent Labs    02/26/19 0632 03/27/19 1531 07/19/19 0657  BNP 547.8* 593.2* 1,147.6*    ProBNP (last 3 results) No results for input(s): PROBNP in the last 8760 hours.    Other results:  Imaging: No results found.   Medications:     Scheduled Medications: . acetaZOLAMIDE  250 mg Oral BID  . ARIPiprazole  20 mg Oral QHS  . aspirin EC  81 mg Oral Daily  . brimonidine  1 drop Both Eyes BID  . Chlorhexidine Gluconate Cloth  6 each Topical Q0600  . donepezil  5 mg Oral QHS  . dorzolamide-timolol  1 drop Both Eyes BID  . enoxaparin (LOVENOX) injection  30 mg  Subcutaneous Q24H  . ferrous sulfate  325 mg Oral Q breakfast  . insulin aspart  0-9 Units Subcutaneous TID WC  . lamoTRIgine  100 mg Oral QPM  . latanoprost  1 drop Both Eyes QHS  . mouth rinse  15 mL Mouth Rinse BID  . mometasone-formoterol  2 puff Inhalation BID  . mupirocin ointment  1 application Nasal BID  . sildenafil  20 mg Oral TID  . sodium chloride flush  3 mL Intravenous Q12H    Infusions: . furosemide (LASIX) infusion 20 mg/hr (07/22/19 2045)    PRN Medications: acetaminophen **OR** acetaminophen, albuterol, guaiFENesin, ipratropium-albuterol, polyethylene glycol   Assessment/Plan:   1. Acute on chronic diastolic HF R>>L with massive volume overload - edema seems much improved on exam on lasix gtt and diamox but weight unchanged (bed weight) - will give one dose metolazone 59m today  and assess response - Watch renal function and electrolytes daily  2. Severe PAH with a/c cor pulmonale - likely combination of WHO Groups I/II/III. Suspect OHS playing significant role.  - on sildenafil. Would continue  3. Acute on CKD 4 - baseline creatinine 2.5-3.0. Peaked at 3.1 - now down 2.98 -> 2.85 - hopefully will continue to improved with volume removal   4. Acute on chronic hypoxic respiratory failure - multifactorial on 8L at home - Improving with diuresis   5. FTT - due to longstanding chronic illness  6. Hyponatremia - Stable at 127 today - limit free water - can uKoreatolvaptan if Na < 125  7. DNR/DNI .    Length of Stay: 4   DGlori BickersMD 07/23/2019, 6:02 AM  Advanced Heart Failure Team Pager [phone](M-F; 7[place]ance  Please contact [facility] for night-coverage after hours (4p -7a ) and weekends on [email or web address]

## 2019-07-23 NOTE — Consult Note (Signed)
Consultation Note Date: 07/23/2019   Patient Name: Elizabeth Thompson  DOB: 12/05/1951  MRN: [id number]  Age / Sex: 68 y.o., female  PCP: Everardo Beals, NP Referring Physician: Sid Falcon, MD  Reason for Consultation: Establishing goals of care  HPI/Patient Profile: 68 y.o. female  with past medical history of severe pulmonary hypertension with cor pulmonale, CKD stg 4, heart failure who was admitted on 07/19/2019 after a fall with SOB.  She is being treated for acute on chronic heart failure and hypoxic respiratory failure.  I received a call today from her bedside RN with a concern that the patient is not waking and becoming alert.  Family requested a Palliative Medicine consult.  Clinical Assessment and Goals of Care:  I have reviewed medical records including EPIC notes, labs and imaging, received report from [facility], assessed the patient and then met at the bedside along with her health care POA Elizabeth Thompson to discuss diagnosis prognosis, Elizabeth Thompson, EOL wishes, disposition and options.  I introduced Palliative Medicine as specialized medical care for people living with serious illness. It focuses on providing relief from the symptoms and stress of a serious illness. The goal is to improve quality of life for both the patient and the family.  We discussed a brief life review of the patient. She lives at home with her niece Elizabeth Thompson.  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  She retired 3 years ago to care for [employer].  Elizabeth Thompson has 1 daughter, a tax attorney who lives out of state.  Their relationship is somewhat strained.   As far as functional and nutritional status Elizabeth Thompson described how she cares for [place] - it sounds as though Elizabeth Thompson is not mobile and needs assistance with most ADLs.  However, per Elizabeth Thompson mind is sharp.  They converse and joke regularly.    Elizabeth Thompson expressed  concern over Elizabeth Thompson's inability to wake.  She described Elizabeth Thompson falling over backward on a bedside commode at home on the day of admission.  Elizabeth Thompson was talking in the ER and contributing to the history.  Elizabeth Thompson stated at some point after that she fell asleep and hasn't been awake since.  Elizabeth Thompson has completed an advanced directive which is loaded into [employer].  She does not want life support, or PEG tube.  She gives Elizabeth Thompson authority to make decisions.   She is a DNR.     Elizabeth Thompson and I discussed next steps.  If we can determine why Elizabeth Thompson won't wake and reverse it then she would likely go home with extra support (Palliative or Hospice) at the time of discharge.  If we are unable to reverse her state then Elizabeth Thompson will likely accept Hospice at home or Elizabeth Thompson.    Per Elizabeth Thompson would want to try all reasonable minimally invasive therapies.  Just last week she stated "I"m not ready for the bone yard yet!"  Questions and concerns were addressed.  The family was encouraged to call with questions or concerns.   Primary  Decision Maker:  Elizabeth Thompson    SUMMARY OF RECOMMENDATIONS   1.  Patient is obtunded.  I discussed this with the primary team who ordered a stat CT head given the patient's fall just prior to admission. 2.  PMT will continue to follow with you - assessing the patient regularly and communicating with Elizabeth Thompson to determine next steps.  Code Status/Advance Care Planning:  DNR   Symptom Management:   Per primary team.  Additional Recommendations (Limitations, Scope, Preferences):  Full Scope Treatment, no hemodialysis, no PEG, No intubation or CPR.  Palliative Prophylaxis:   Delirium Protocol  Psycho-social/Spiritual:   Desire for further Chaplaincy support: No discussed.  Prognosis:  To be determined.  Elizabeth Thompson is immobile with severe pulm HTN, ES-COPD and diastolic HF she is in quite a weakened state.  We will know more as her work up evolves.     Discharge Planning: To Be Determined      Primary Diagnoses: Present on Admission: **None**   I have reviewed the medical record, interviewed the patient and family, and examined the patient. The following aspects are pertinent.  Past Medical History:  Diagnosis Date  . Acute respiratory distress 03/28/2019  . Acute respiratory failure ([place]) 06/09/2017  . Arthritis   . Back pain   . Bell's palsy   . Bipolar disorder (Elizabeth Thompson)   . Bronchitis   . Chronic low back pain 05/09/2015  . Cough with expectoration 05/21/16   recent diagnosis of bronchitis  . Cyst of right kidney   . Diabetes mellitus without complication ([place])    type 2  . Frequency of urination   . GERD (gastroesophageal reflux disease)   . Glaucoma   . History of blood transfusion   . History of colon polyps   . History of hiatal hernia   . History of tobacco use   . Hypercalcemia   . Hyperlipidemia   . Hypertension   . Memory disorder 09/05/2014  . On home oxygen therapy    3L/M [place] at night   . Schizo-affective psychosis ([place])   . Shortness of breath dyspnea    exertion, or with out oxygen  . Uterine fibroid    Social History   Socioeconomic History  . Marital status: Divorced    Spouse name: Not on file  . Number of children: 1  . Years of education: college 4  . Highest education level: Not on file  Occupational History  . Occupation: disabled  Social Needs  . Financial resource strain: Not on file  . Food insecurity    Worry: Not on file    Inability: Not on file  . Transportation needs    Medical: Not on file    Non-medical: Not on file  Tobacco Use  . Smoking status: Former Smoker    Packs/day: 1.00    Years: 28.00    Pack years: 28.00    Types: Cigarettes    Quit date: 07/15/2015    Years since quitting: 4.0  . Smokeless tobacco: Never Used  Substance and Sexual Activity  . Alcohol use: No    Alcohol/week: 0.0 standard drinks  . Drug use: No  . Sexual activity: Not Currently     Birth control/protection: None  Lifestyle  . Physical activity    Days per week: Not on file    Minutes per session: Not on file  . Stress: Not on file  Relationships  . Social connections    Talks on phone: Not on file  Gets together: Not on file    Attends religious service: Not on file    Active member of club or organization: Not on file    Attends meetings of clubs or organizations: Not on file    Relationship status: Not on file  Other Topics Concern  . Not on file  Social History Narrative   Patient is right handed.   Patient drinks 2 cups caffeine daily.   Family History  Problem Relation Age of Onset  . Dementia Mother   . Alzheimer's disease Mother   . Heart disease Mother   . Hypertension Mother   . Diabetes Mother   . Diabetes Father   . Alzheimer's disease Sister   . Heart disease Brother   . Heart attack Brother   . Bladder Cancer Neg Hx   . Kidney cancer Neg Hx   . Prostate cancer Neg Hx    Scheduled Meds: . acetaZOLAMIDE  250 mg Oral BID  . ARIPiprazole  20 mg Oral QHS  . aspirin EC  81 mg Oral Daily  . brimonidine  1 drop Both Eyes BID  . Chlorhexidine Gluconate Cloth  6 each Topical Q0600  . donepezil  5 mg Oral QHS  . dorzolamide-timolol  1 drop Both Eyes BID  . enoxaparin (LOVENOX) injection  30 mg Subcutaneous Q24H  . ferrous sulfate  325 mg Oral Q breakfast  . insulin aspart  0-9 Units Subcutaneous TID WC  . lamoTRIgine  100 mg Oral QPM  . latanoprost  1 drop Both Eyes QHS  . mouth rinse  15 mL Mouth Rinse BID  . mometasone-formoterol  2 puff Inhalation BID  . mupirocin ointment  1 application Nasal BID  . sildenafil  20 mg Oral TID  . sodium chloride flush  3 mL Intravenous Q12H   Continuous Infusions: . furosemide (LASIX) infusion 20 mg/hr (07/23/19 1600)   PRN Meds:.acetaminophen **OR** acetaminophen, albuterol, guaiFENesin, ipratropium-albuterol, polyethylene glycol Allergies  Allergen Reactions  . Codeine Nausea And Vomiting  .  Nitrostat [Nitroglycerin] Other (See Comments)    Pt is on revatio  . Prednisone Other (See Comments)    Has to monitor because she is a diabetic    Review of Systems patient unable.  Physical Exam  Obese female, sleeping soundly, barely flutters her eyes in response to my attempts to wake her. CV rrr Resp no distress Abdomen soft, nt nd LE with edema noted in her thighs, distal lower extremities are wrapped.  Vital Signs: BP 103/63 (BP Location: Left Arm)   Pulse 83   Temp 98.3 F (36.8 C) (Oral)   Resp 12   Ht _0  (1.6 m)   Wt 97.7 kg   SpO2 94%   BMI 38.15 kg/m  Pain Scale: 0-10 POSS *See Group Information*: 2-Acceptable,Slightly drowsy, easily aroused Pain Score: 0-No pain   SpO2: SpO2: 94 % O2 Device:SpO2: 94 % O2 Flow Rate: .O2 Flow Rate (L/min): 15 L/min  IO: Intake/output summary:   Intake/Output Summary (Last 24 hours) at 07/23/2019 1918 Last data filed at 07/23/2019 1800 Gross per 24 hour  Intake 818.99 ml  Output 2100 ml  Net -1281.01 ml    LBM: Last BM Date: 07/22/19 Baseline Weight: Weight: 83.5 kg Most recent weight: Weight: 97.7 kg     Palliative Assessment/Data: 10%     Time In: 4:50 pm Time Out: 6:00 pm  Time Total: 70 min. Visit consisted of counseling and education dealing with the complex and emotionally intense issues surrounding the  need for palliative care and symptom management in the setting of serious and potentially life-threatening illness. Greater than 50%  of this time was spent counseling and coordinating care related to the above assessment and plan.  Signed by: Florentina Jenny, PA-C Palliative Medicine Pager: [phone]  Please contact Palliative Medicine Team phone at [phone] for questions and concerns.  For individual provider: See Shea Evans

## 2019-07-23 NOTE — Progress Notes (Signed)
Patient had bowel movement in bed, patient started to cry saying "my husband left me, my husband left me". RN asked patient if her husband had recently left she stated "yes, recently" "I don't know why". RN and NT gave patient a bath, full bed change, gown change and new purewick put in place. Patient was repositioned to left side with pillows under right side. Bed is in lowest position and bed alarm is on. Will continue to monitor patient.

## 2019-07-23 NOTE — Progress Notes (Addendum)
[name], [name], visited at noon. Pt remains lethargic but responded to [name]'s voice. Minimal intake today with full aspiration precautions. 15:44 [name] has requested a Palliative consult. IMTS notified of her request

## 2019-07-23 NOTE — Progress Notes (Signed)
Patient requiring 15L HFNC to maintain spo2. RT did not place patient on CPAP at this time. RT will monitor as needed.

## 2019-07-24 ENCOUNTER — Inpatient Hospital Stay (HOSPITAL_COMMUNITY): Payer: Medicare Other

## 2019-07-24 DIAGNOSIS — I509 Heart failure, unspecified: Secondary | ICD-10-CM

## 2019-07-24 LAB — GLUCOSE, CAPILLARY
Glucose-Capillary: 75 mg/dL (ref 70–99)
Glucose-Capillary: 123 mg/dL — ABNORMAL HIGH (ref 70–99)
Glucose-Capillary: 104 mg/dL — ABNORMAL HIGH (ref 70–99)
Glucose-Capillary: 158 mg/dL — ABNORMAL HIGH (ref 70–99)
Glucose-Capillary: 117 mg/dL — ABNORMAL HIGH (ref 70–99)

## 2019-07-24 LAB — URINALYSIS, ROUTINE W REFLEX MICROSCOPIC
Bilirubin Urine: NEGATIVE
Glucose, UA: NEGATIVE mg/dL
Ketones, ur: NEGATIVE mg/dL
Nitrite: NEGATIVE
Protein, ur: NEGATIVE mg/dL
Specific Gravity, Urine: 1.006 (ref 1.005–1.030)
pH: 5 (ref 5.0–8.0)

## 2019-07-24 LAB — BASIC METABOLIC PANEL
Anion gap: 15 (ref 5–15)
BUN: 65 mg/dL — ABNORMAL HIGH (ref 8–23)
CO2: 26 mmol/L (ref 22–32)
Calcium: 9.3 mg/dL (ref 8.9–10.3)
Chloride: 86 mmol/L — ABNORMAL LOW (ref 98–111)
Creatinine, Ser: 2.92 mg/dL — ABNORMAL HIGH (ref 0.44–1.00)
GFR calc Af Amer: 18 mL/min — ABNORMAL LOW (ref >60)
GFR calc non Af Amer: 16 mL/min — ABNORMAL LOW (ref >60)
Glucose, Bld: 139 mg/dL — ABNORMAL HIGH (ref 70–99)
Potassium: 3.1 mmol/L — ABNORMAL LOW (ref 3.5–5.1)
Sodium: 127 mmol/L — ABNORMAL LOW (ref 135–145)

## 2019-07-24 MED ORDER — POTASSIUM CHLORIDE 10 MEQ/100ML IV SOLN
10.0000 meq | INTRAVENOUS | Status: AC
  Administered 2019-07-24 (×4): 10 meq via INTRAVENOUS
  Filled 2019-07-24 (×4): qty 100

## 2019-07-24 NOTE — Progress Notes (Signed)
Patient lethargic tonight, responded with open eyes while providing mouth/oral care but only briefly. Offered patient fluid to which she would only take a sip with several minutes of prompting and encouragement.   Oral medication is getting harder to give because patient is too lethargic and having difficulty taking in fluids through a straw. She is resting, bed in lowest position, bed alarm is on and call bell in reach. Will continue to monitor.

## 2019-07-24 NOTE — Plan of Care (Signed)
  Problem: Education: Goal: Ability to state activities that reduce stress will improve Outcome: Not Progressing   Problem: Coping: Goal: Ability to identify and develop effective coping behavior will improve Outcome: Not Progressing   Problem: Self-Concept: Goal: Ability to identify factors that promote anxiety will improve Outcome: Not Progressing Goal: Level of anxiety will decrease Outcome: Not Progressing Goal: Ability to modify response to factors that promote anxiety will improve Outcome: Not Progressing   Problem: Education: Goal: Ability to demonstrate management of disease process will improve Outcome: Not Progressing Goal: Ability to verbalize understanding of medication therapies will improve Outcome: Not Progressing Goal: Individualized Educational Video(s) Outcome: Not Progressing   Problem: Activity: Goal: Capacity to carry out activities will improve Outcome: Not Progressing   Problem: Cardiac: Goal: Ability to achieve and maintain adequate cardiopulmonary perfusion will improve Outcome: Not Progressing   Problem: Education: Goal: Ability to describe self-care measures that may prevent or decrease complications (Diabetes Survival Skills Education) will improve Outcome: Not Progressing Goal: Individualized Educational Video(s) Outcome: Not Progressing   Problem: Coping: Goal: Ability to adjust to condition or change in health will improve Outcome: Not Progressing   Problem: Fluid Volume: Goal: Ability to maintain a balanced intake and output will improve Outcome: Not Progressing   Problem: Health Behavior/Discharge Planning: Goal: Ability to identify and utilize available resources and services will improve Outcome: Not Progressing Goal: Ability to manage health-related needs will improve Outcome: Not Progressing   Problem: Metabolic: Goal: Ability to maintain appropriate glucose levels will improve Outcome: Not Progressing   Problem:  Nutritional: Goal: Maintenance of adequate nutrition will improve Outcome: Not Progressing Goal: Progress toward achieving an optimal weight will improve Outcome: Not Progressing   Problem: Skin Integrity: Goal: Risk for impaired skin integrity will decrease Outcome: Not Progressing   Problem: Tissue Perfusion: Goal: Adequacy of tissue perfusion will improve Outcome: Not Progressing

## 2019-07-24 NOTE — Progress Notes (Signed)
Daily Progress Note   Patient Name: Elizabeth Thompson       Date: 07/24/2019 DOB: September 09, 1951  Age: 68 y.o. MRN#: [id number] Attending Physician: Sid Falcon, MD Primary Care Physician: Everardo Beals, NP Admit Date: 07/19/2019  Reason for Consultation/Follow-up: Establishing goals of care  Subjective: Visited patient at bedside.  [name] is there.  We talked about the results of the CT head - it does not give [place] an etiology for her lethargy. We talked about the low sodium, low BP, need for diuresis and how these things have to be very carefully balanced.  We briefly discussed pressors - [name] stated I would hate to worsen her kidneys but using pressors.  Sharryn was able to open her eyes for brief moments.  I fed her an entire magic cup and some pureed peaches.  Until the very end she chewed and swallowed well.  With the last bite she was tired and I had to encourage her to swallow.  Afterward it was evident that she was fatigued and we stopped.  [name] is aware that Finesse's wishes are clear that she would not want to remain in this state long term.  We agreed to give it time and reassess in a few days.   Assessment: Very lethargic, hyponatremic 127, BP has been soft.  Able to eat small amounts of pureed food safely before becoming fatigued.   Patient Profile/HPI:  68 y.o. female  with past medical history of severe pulmonary hypertension with cor pulmonale, CKD stg 4, heart failure who was admitted on 07/19/2019 after a fall with SOB.   She is being treated for acute on chronic heart failure and hypoxic respiratory failure.  I received a call today from her bedside RN with a concern that the patient is not waking and becoming alert.  Family requested a Palliative Medicine consult.  Length  of Stay: 5  Current Medications: Scheduled Meds:  . acetaZOLAMIDE  250 mg Oral BID  . ARIPiprazole  20 mg Oral QHS  . aspirin EC  81 mg Oral Daily  . brimonidine  1 drop Both Eyes BID  . Chlorhexidine Gluconate Cloth  6 each Topical Q0600  . donepezil  5 mg Oral QHS  . dorzolamide-timolol  1 drop Both Eyes BID  . enoxaparin (LOVENOX) injection  30 mg Subcutaneous  Q24H  . ferrous sulfate  325 mg Oral Q breakfast  . insulin aspart  0-9 Units Subcutaneous TID WC  . lamoTRIgine  100 mg Oral QPM  . latanoprost  1 drop Both Eyes QHS  . mouth rinse  15 mL Mouth Rinse BID  . mometasone-formoterol  2 puff Inhalation BID  . sildenafil  20 mg Oral TID  . sodium chloride flush  3 mL Intravenous Q12H    Continuous Infusions: . furosemide (LASIX) infusion 20 mg/hr (07/24/19 1331)    PRN Meds: acetaminophen **OR** acetaminophen, albuterol, guaiFENesin, ipratropium-albuterol, polyethylene glycol, sodium chloride  Physical Exam        Well developed very lethargic female, will grunt and attempt to say a word or two in response to my questions.  Too lethargic to hold eyes open CV rrr resp no distress Abdomen soft, nt, nd, obese Lower ext - +edema in thighs. Distal lower extremities wrapped.  Vital Signs: BP 97/79   Pulse 91   Temp 98.8 F (37.1 C) (Axillary)   Resp 13   Ht 5' 3" (1.6 m)   Wt 95.2 kg   SpO2 98%   BMI 37.18 kg/m  SpO2: SpO2: 98 % O2 Device: O2 Device: High Flow Nasal Cannula O2 Flow Rate: O2 Flow Rate (L/min): 7 L/min  Intake/output summary:   Intake/Output Summary (Last 24 hours) at 07/24/2019 1345 Last data filed at 07/24/2019 1314 Gross per 24 hour  Intake 788.09 ml  Output 2000 ml  Net -1211.91 ml   LBM: Last BM Date: 07/22/19 Baseline Weight: Weight: 83.5 kg Most recent weight: Weight: 95.2 kg       Palliative Assessment/Data: 20%      Patient Active Problem List   Diagnosis Date Noted  . Fall   . Palliative care encounter   . Peripheral edema    . CHF exacerbation ([place]) 07/19/2019  . Acute on chronic diastolic CHF (congestive heart failure) ([place])   . FTT (failure to thrive) in adult   . CKD (chronic kidney disease) stage 4, GFR 15-29 ml/min (HCC)   . Cor pulmonale, chronic ([place])   . Acute on chronic right-sided heart failure ([place])   . Hypervolemia   . Goals of care, counseling/discussion   . Palliative care by specialist   . OSA (obstructive sleep apnea)   . AKI (acute kidney injury) ([place]) 01/03/2019  . Acute respiratory failure ([place]) 01/03/2019  . Hypertension 07/02/2017  . Chronic respiratory failure with hypoxia and hypercapnia ([place]) 07/02/2017  . COPD (chronic obstructive pulmonary disease) ([place]) 07/02/2017  . Diabetes mellitus without complication ([place]) 76/28/3151  . Pulmonary hypertension ([place]) 07/02/2017  . Bipolar disorder ([place]) 07/02/2017  . Hyperlipidemia 07/02/2017  . Acute hyponatremia 07/02/2017  . Acute metabolic encephalopathy 76/16/0737  . CKD (chronic kidney disease) stage 3, GFR 30-59 ml/min (HCC) 07/02/2017  . Elevated troponin   . Cor pulmonale, acute ([place])   . Acute on chronic respiratory failure ([place]) 06/09/2017  . Chronic renal disease, stage III ([place]) 05/05/2017  . Chronic respiratory failure with hypoxia ([place]) 05/05/2017  . Renal cell carcinoma, left ([place]) 02/05/2017  . Dyspnea 12/28/2016  . Depression 09/04/2016  . Renal malignant tumor ([place]) 06/26/2016  . GERD (gastroesophageal reflux disease) 03/22/2016  . Insomnia 03/22/2016  . COPD GOLD 0  12/06/2015  . Essential hypertension 12/06/2015  . COPD with acute exacerbation ([place]) 11/20/2015  . Multiple lung nodules 11/20/2015  . Morbid obesity due to excess calories ([place]) 11/20/2015  . Hypersomnia with sleep apnea 11/20/2015  .  Diabetes mellitus type 2, noninsulin dependent ([place]) 11/06/2015  . HLD (hyperlipidemia) 11/06/2015  . Chronic low back pain 05/09/2015  . Gait disorder 05/09/2015  . Memory disorder 09/05/2014  . Schizoaffective  disorder ([place]) 08/18/2014    Palliative Care Plan    Recommendations/Plan:  PMT will check in in a couple of days.    Patient clearly expressed her wishes in her advanced directive.  [name] at bedside wants to give it time.  (appropriately so)  Goals of Care and Additional Recommendations:  Limitations on Scope of Treatment: Full Scope Treatment  Code Status:  DNR  Prognosis:   To be determined.  Quenesha is immobile with severe pulm HTN, ES-COPD and diastolic HF she is in quite a weakened state.  She is at high risk for acute decompensation, decline and death.  We will know more as her work up evolves.  Discharge Planning:  To Be Determined  Care plan was discussed with Olin Hauser and bedside RN.  Thank you for allowing the Palliative Medicine Team to assist in the care of this patient.  Total time spent:  35 min.     Greater than 50%  of this time was spent counseling and coordinating care related to the above assessment and plan.  Florentina Jenny, PA-C Palliative Medicine  Please contact Palliative MedicineTeam phone at [phone] for questions and concerns between 7 am - 7 pm.   Please see AMION for individual provider pager numbers.

## 2019-07-24 NOTE — Progress Notes (Signed)
Bed alarm on, patient bed in lowest position, call bell in hand.

## 2019-07-24 NOTE — Progress Notes (Signed)
  Date: 07/24/2019  Patient name: Elizabeth Thompson  Medical record number: [id number]  Date of birth: 06/30/1951   I have seen and evaluated this patient and I have discussed the plan of care with the house staff. Please see Dr. Ronnald Ramp' note for complete details. I concur with her findings and plan.  Reviewed cardiology note and patient not improving as expected with new AMS from over the weekend.  CT head to evaluate.  No clear metabolic cause based on lab work already done, will check a UA today.   Sid Falcon, MD 07/24/2019, 1:06 PM

## 2019-07-24 NOTE — Progress Notes (Addendum)
Subjective: Pt seen at the bedside this morning. More lethargic and unresponsive today, will open eyes. R sided facial droop noted. Pt shrugs shoulders when asked if she is in pain. Not moving extremities on command.   Objective:  Vital signs in last 24 hours: Vitals:   07/24/19 0000 07/24/19 0200 07/24/19 0300 07/24/19 0500  BP: 93/75 93/79 100/79   Pulse: 89 97 87 91  Resp: _0 Temp:   98.1 F (36.7 C)   TempSrc:   Oral   SpO2: 94% (!) 87% 95% 97%  Weight:    95.2 kg  Height:       Physical Exam Constitutional:      Appearance: She is ill-appearing.     Comments: Pt more unresponsive this morning. Not withdrawing arms or legs to pain bilaterally.  Cardiovascular:     Rate and Rhythm: Normal rate and regular rhythm.     Heart sounds: Heart sounds are distant. No murmur. No friction rub. No gallop.   Pulmonary:     Effort: Respiratory distress (chronic) present.  Abdominal:     General: Abdomen is flat. There is no distension.  Musculoskeletal:     Right lower leg: 3+ Edema present.     Left lower leg: 3+ Edema present.     Comments: Unna boots in place, socks off.  Neurological:     Mental Status: She is lethargic.     Cranial Nerves: Facial asymmetry (R facial droop) present.     Deep Tendon Reflexes: Reflexes abnormal.     Comments: Hyper-reflexive biceps reflex on R side. Able to answer that she is at [facility].     Assessment/Plan: Assessment - Ms. Swider is a 68 year old F with significant PMH of end stage heart failure and COPD, CKD stage 3/4, diabetes, OSA, and memory disorder who presented to the ED due to volume overload and a fall at home. This appears to be an acute worsening of the chronic progression of her end-stage HF, COPD, and CKD. [name] condition getting worse overall.   Altered Mental Status  Pt became less responsive yesterday afternoon. At that time, niece, [name], requested palliative care who spoke with her yesterday. This morning, pt is less  responsive, with R facial droop, unable to move extremities, and is hyperreflexic on the R side. Concern for metabolic encephalopathy vs acute stroke - CT head ordered Palliative care consulted, appreciate their input.  - if AMS reversible, niece would like pt to go home with extra support  - if non-reversible, then [name] would accept hospice at home or Hospice House - ABG - normal pH 7.363 and pCO2 46.3, low pO2 at 56 (consistent with O2 saturation 88-90%) - ammonia normal - ordered UA - holding PO meds and diet - could consider holding acetazolamide in the future due to associated CNS depression - will continue to reach out to [facility] to discuss next steps and further workup/treatment   Acute on Chronic Diastolic Heart Failure with Pulmonary Hypertension Pt presented to the ED with volume overload on exam consistent with exacerbation of right sided heart failure. Per outpatient cardiology notes, pt's condition at advanced stage.  Cardiology consulted on admission  - reached out to advanced CHF team for opinion Heart Failure team consulted, appreciate recommendations Severe PAH likely WHO Groups I/II/III  - continue lasix infusion at 40m/hr  - acetazolamide 2526mPO BID  - wrap legs with Unna boots  - continue sildenafil  - do not recommend further  aggressive management (pressors or inotropes) - strict I/Os (balance of -832m yesterday; daily wt down 2.5kg) - supplemental oxygen (high flow) to keep O2 saturation >90% - holding home spironolactone in light of soft pressures - follow-up LE doppler results; low suspicion for DVT with pt's continued clinical improvement    Hypokalemia - K 3.1 this morning - replete with 163m IV x 4    Hypervolemic Hyponatremia  [name] Na was 127 on admission. Most likely related to patient's HF and resulting volume overload. Per chart review, sodium also low during earlier hospitalizations this year for decompensated HF and respiratory failure. - Na 127  this morning  - continue with fluid restriction - monitor daily BMP - can use tolvaptan if Na <125   Cardiorenal syndrome -  [name] Cr was 3.07 on admission (prior baseline Cr ~2.3). Rise in Cr attributed to volume overload vs worsening CKD vs cardiorenal syndrome - Cr bumped this morning after an initial improvement over the last 2 days - will continue current diuretic management as above - check daily BMPs - strict I/Os   Acute on Chronic Hypoxic Respiratory Failure due to COPD Pt noted to have centrolobular emphysema on CT, GOLD stage 0 due to PFTs not meeting criteria. Multiple hospitalizations due to COPD and respiratory failure. Is on 7-8L O2 at home. Niece reports desaturations at home that improve with repositioning and CPAP machine use.  - pt continues to be on high flow today, will continue to monitor respiratory status  - continue home inhalers  - dulera 2 puffs BID  - albuterol 2.43m22m4h PRN  - combivent 1-2 puffs q4h PRN - duonebs q4h PRN - home trilogy machine used as CPAP - supplemental O2 (high flow) to keep saturation >90%   Diabetes Pt takes glimepiride and metformin at home.  - SSI  Diet - NPO currently due to AMS Fluids - none DVT ppx - lovenox 73m87mbQ daily CODE STATUS - DNR   Dispo: Will discuss pt's discharge planning with niece (discuss home hospice vs hospice house)   JoneLadona Horns 07/24/2019, 6:15 AM Pager: [phone]

## 2019-07-24 NOTE — Progress Notes (Signed)
Patient awoke moaning, agitated. Patient had thick yellow mucous on the inside of mouth. RN suctioned and did mouth/oral care (washed mouth with warm wash cloth and moisturizer applied). Patient also repeating "Take socks off", take socks off". RN removed socks leaving unna boots in place. Patient resting more comfortably at this time. Will continue to monitor.

## 2019-07-24 NOTE — Progress Notes (Signed)
Advanced Heart Failure Rounding Note   Subjective:    Much less responsive over the 24 hours. She will tell me she is at [facility] but not much more. Very weak. Unable to move extremities on command.   ABG and ammonia ok  Diuresing well. Weight down 6 pounds overnight. 19 pounds total. Creatinine starting to bump back up    Objective:   Weight Range:  Vital Signs:   Temp:  [98 F (36.7 C)-98.3 F (36.8 C)] 98 F (36.7 C) (08/10 0732) Pulse Rate:  [83-100] 93 (08/10 0732) Resp:  [11-18] 13 (08/10 0732) BP: (78-110)/(52-85) 86/56 (08/10 0735) SpO2:  [87 %-98 %] 95 % (08/10 0732) Weight:  [95.2 kg] 95.2 kg (08/10 0500) Last BM Date: 07/22/19  Weight change: Filed Weights   07/22/19 0623 07/23/19 0601 07/24/19 0500  Weight: 97.9 kg 97.7 kg 95.2 kg    Intake/Output:   Intake/Output Summary (Last 24 hours) at 07/24/2019 0839 Last data filed at 07/24/2019 0736 Gross per 24 hour  Intake 735.02 ml  Output 2150 ml  Net -1414.98 ml     Physical Exam: General:  Weak appearing. No resp difficulty HEENT: normal + left facial droop  Neck: supple. JVP to jaw. Carotids 2+ bilat; no bruits. No lymphadenopathy or thryomegaly appreciated. Cor: PMI nondisplaced. Regular rate & rhythm. No rubs, gallops or murmurs. Lungs: clear Abdomen: obese soft, nontender, nondistended. No hepatosplenomegaly. No bruits or masses. Good bowel sounds. Extremities: no cyanosis, clubbing, rash, 1-2+ edema  + UNNA bootsct. Neuro: awake. Lethargic will tell me she is at [facility] but not much more. Will not move extremities on command   Telemetry: NSR 80-90s Personally reviewed   Labs: Basic Metabolic Panel: Recent Labs  Lab 07/19/19 1036  07/20/19 0232 07/21/19 0245 07/22/19 0241 07/23/19 0240 07/24/19 0254  NA  --    < > 128* 126* 128* 127* 127*  K  --    < > 4.1 3.8 4.1 3.7 3.1*  CL  --    < > 92* 87* 89* 89* 86*  CO2  --    < > 19* 26 21* 19* 26  GLUCOSE  --    < > 90 87 90 112* 139*  BUN   --    < > 59* 59* 60* 62* 65*  CREATININE 2.89*   < > 3.07* 3.10* 2.98* 2.85* 2.92*  CALCIUM  --    < > 9.7 9.4 9.6 9.3 9.3  MG 2.5*  --   --   --   --   --   --    < > = values in this interval not displayed.    Liver Function Tests: Recent Labs  Lab 07/19/19 0746  AST 39  ALT 22  ALKPHOS 77  BILITOT 1.2  PROT 6.2*  ALBUMIN 3.3*   No results for input(s): LIPASE, AMYLASE in the last 168 hours. Recent Labs  Lab 07/23/19 1442  AMMONIA 26    CBC: Recent Labs  Lab 07/19/19 0657 07/19/19 1036 07/23/19 1442  WBC 4.2 4.1 4.5  HGB 14.5 14.8 13.8  HCT 46.7* 48.8* 43.6  MCV 81.8 83.3 80.6  PLT 406* 280 371    Cardiac Enzymes: No results for input(s): CKTOTAL, CKMB, CKMBINDEX, TROPONINI in the last 168 hours.  BNP: BNP (last 3 results) Recent Labs    02/26/19 0632 03/27/19 1531 07/19/19 0657  BNP 547.8* 593.2* 1,147.6*    ProBNP (last 3 results) No results for input(s): PROBNP in the last  8760 hours.    Other results:  Imaging: No results found.   Medications:     Scheduled Medications: . acetaZOLAMIDE  250 mg Oral BID  . ARIPiprazole  20 mg Oral QHS  . aspirin EC  81 mg Oral Daily  . brimonidine  1 drop Both Eyes BID  . Chlorhexidine Gluconate Cloth  6 each Topical Q0600  . donepezil  5 mg Oral QHS  . dorzolamide-timolol  1 drop Both Eyes BID  . enoxaparin (LOVENOX) injection  30 mg Subcutaneous Q24H  . ferrous sulfate  325 mg Oral Q breakfast  . insulin aspart  0-9 Units Subcutaneous TID WC  . lamoTRIgine  100 mg Oral QPM  . latanoprost  1 drop Both Eyes QHS  . mouth rinse  15 mL Mouth Rinse BID  . mometasone-formoterol  2 puff Inhalation BID  . mupirocin ointment  1 application Nasal BID  . sildenafil  20 mg Oral TID  . sodium chloride flush  3 mL Intravenous Q12H    Infusions: . furosemide (LASIX) infusion 20 mg/hr (07/23/19 2254)  . potassium chloride      PRN Medications: acetaminophen **OR** acetaminophen, albuterol, guaiFENesin,  ipratropium-albuterol, polyethylene glycol, sodium chloride   Assessment/Plan:   1. Acute on chronic diastolic HF R>>L with massive volume overload - volume status improving on lasix gtt abe actezolamide - weight today 209 pounds baseline weight seems to be ~195 - creatinine up slightly from yesterday. Will continue current regimen for now  2. Severe PAH with a/c cor pulmonale - likely combination of WHO Groups I/II/III. Suspect OHS playing significant role.  - on sildenafil. Would continue  3. Acute on CKD 4 - baseline creatinine 2.5-3.0. Peaked at 3.1 - creatinine up slightly today but still improved from admit - hopefully will continue to improved with volume removal   4. Acute on chronic hypoxic respiratory failure - multifactorial on 8L at home - now on 15 HFNC - hopefully will improve with diuresis  5. FTT - due to longstanding chronic illness  6. Hyponatremia - limit free water - can [place] tolvaptan if Na < 125  7. Hypokalemia - supp   8. Lethargy, possible R facial droop.  - discussed with IM service at bedside. Will get head CT - ammonia and ABG ok. Will check UA  9. DNR/DNI - unfortunately she is getting worse overall. I spoke with her niece this am and she agrees with getting CT. I did discuss that she is not making the progress that we hoped she would with diuresis and it may be time to re-involve the Hospice team. She was open to that.   Length of Stay: 5   Glori Bickers MD 07/24/2019, 8:39 AM  Advanced Heart Failure Team Pager [phone] (M-F; [place])  Please contact [facility] for night-coverage after hours (4p -7a ) and weekends on [email or web address]

## 2019-07-25 LAB — GLUCOSE, CAPILLARY
Glucose-Capillary: 105 mg/dL — ABNORMAL HIGH (ref 70–99)
Glucose-Capillary: 114 mg/dL — ABNORMAL HIGH (ref 70–99)
Glucose-Capillary: 150 mg/dL — ABNORMAL HIGH (ref 70–99)
Glucose-Capillary: 128 mg/dL — ABNORMAL HIGH (ref 70–99)

## 2019-07-25 LAB — BASIC METABOLIC PANEL
Anion gap: 18 — ABNORMAL HIGH (ref 5–15)
BUN: 66 mg/dL — ABNORMAL HIGH (ref 8–23)
CO2: 25 mmol/L (ref 22–32)
Calcium: 9.3 mg/dL (ref 8.9–10.3)
Chloride: 85 mmol/L — ABNORMAL LOW (ref 98–111)
Creatinine, Ser: 2.84 mg/dL — ABNORMAL HIGH (ref 0.44–1.00)
GFR calc Af Amer: 19 mL/min — ABNORMAL LOW (ref >60)
GFR calc non Af Amer: 16 mL/min — ABNORMAL LOW (ref >60)
Glucose, Bld: 110 mg/dL — ABNORMAL HIGH (ref 70–99)
Potassium: 3 mmol/L — ABNORMAL LOW (ref 3.5–5.1)
Sodium: 128 mmol/L — ABNORMAL LOW (ref 135–145)

## 2019-07-25 LAB — MAGNESIUM: Magnesium: 2.3 mg/dL (ref 1.7–2.4)

## 2019-07-25 MED ORDER — POTASSIUM CHLORIDE 10 MEQ/100ML IV SOLN
10.0000 meq | INTRAVENOUS | Status: AC
  Administered 2019-07-25 (×3): 10 meq via INTRAVENOUS
  Filled 2019-07-25 (×3): qty 100

## 2019-07-25 MED ORDER — POTASSIUM CHLORIDE 10 MEQ/100ML IV SOLN
10.0000 meq | INTRAVENOUS | Status: AC
  Administered 2019-07-25 (×3): 10 meq via INTRAVENOUS
  Filled 2019-07-25 (×4): qty 100

## 2019-07-25 MED ORDER — MIDODRINE HCL 5 MG PO TABS
2.5000 mg | ORAL_TABLET | Freq: Three times a day (TID) | ORAL | Status: DC
  Administered 2019-07-25 – 2019-07-27 (×5): 2.5 mg via ORAL
  Filled 2019-07-25 (×5): qty 1

## 2019-07-25 NOTE — Progress Notes (Signed)
Advanced Heart Failure Rounding Note   Subjective:    More alert this am able to squeeze hands and wiggle toes bilaterally. Knows she is at [facility]. Head CT negative.   Continues to diurese on lasix gtt weight down another 3 pounds (22 pounds total)  SBP in 90s   UA +   Creatinine stable 2.8   Objective:   Weight Range:  Vital Signs:   Temp:  [97.9 F (36.6 C)-98.8 F (37.1 C)] 98.3 F (36.8 C) (08/11 0734) Pulse Rate:  [84-95] 89 (08/11 0734) Resp:  [11-33] 18 (08/11 0734) BP: (75-98)/(53-79) 75/53 (08/11 0734) SpO2:  [87 %-98 %] 93 % (08/11 0734) Weight:  [93.7 kg] 93.7 kg (08/11 0407) Last BM Date: 07/24/19  Weight change: Filed Weights   07/23/19 0601 07/24/19 0500 07/25/19 0407  Weight: 97.7 kg 95.2 kg 93.7 kg    Intake/Output:   Intake/Output Summary (Last 24 hours) at 07/25/2019 0833 Last data filed at 07/25/2019 0734 Gross per 24 hour  Intake 822.9 ml  Output 550 ml  Net 272.9 ml     Physical Exam: General:  Weak appearing. Lethargic but more alert than yesterday  No resp difficulty HEENT: normal Neck: supple. JVP hard to ssCarotids 2+ bilat; no bruits. No lymphadenopathy or thryomegaly appreciated. Cor: PMI nondisplaced. Regular rate & rhythm. No rubs, gallops or murmurs. Lungs: clear Abdomen: obese soft, nontender, nondistended. No hepatosplenomegaly. No bruits or masses. Good bowel sounds. Extremities: no cyanosis, clubbing, rash, UNNA boots. 2+ thigh edema Neuro: lethargic. Awake can tell me where she is at squeezes hands and wiggles toe   Telemetry: NSR 80-90s Personally reviewed   Labs: Basic Metabolic Panel: Recent Labs  Lab 07/19/19 1036  07/21/19 0245 07/22/19 0241 07/23/19 0240 07/24/19 0254 07/25/19 0246  NA  --    < > 126* 128* 127* 127* 128*  K  --    < > 3.8 4.1 3.7 3.1* 3.0*  CL  --    < > 87* 89* 89* 86* 85*  CO2  --    < > 26 21* 19* 26 25  GLUCOSE  --    < > 87 90 112* 139* 110*  BUN  --    < > 59* 60* 62* 65* 66*   CREATININE 2.89*   < > 3.10* 2.98* 2.85* 2.92* 2.84*  CALCIUM  --    < > 9.4 9.6 9.3 9.3 9.3  MG 2.5*  --   --   --   --   --   --    < > = values in this interval not displayed.    Liver Function Tests: Recent Labs  Lab 07/19/19 0746  AST 39  ALT 22  ALKPHOS 77  BILITOT 1.2  PROT 6.2*  ALBUMIN 3.3*   No results for input(s): LIPASE, AMYLASE in the last 168 hours. Recent Labs  Lab 07/23/19 1442  AMMONIA 26    CBC: Recent Labs  Lab 07/19/19 0657 07/19/19 1036 07/23/19 1442  WBC 4.2 4.1 4.5  HGB 14.5 14.8 13.8  HCT 46.7* 48.8* 43.6  MCV 81.8 83.3 80.6  PLT 406* 280 371    Cardiac Enzymes: No results for input(s): CKTOTAL, CKMB, CKMBINDEX, TROPONINI in the last 168 hours.  BNP: BNP (last 3 results) Recent Labs    02/26/19 0632 03/27/19 1531 07/19/19 0657  BNP 547.8* 593.2* 1,147.6*    ProBNP (last 3 results) No results for input(s): PROBNP in the last 8760 hours.    Other  results:  Imaging: Ct Head Wo Contrast  Result Date: 07/24/2019 CLINICAL DATA:  Altered level of consciousness. EXAM: CT HEAD WITHOUT CONTRAST TECHNIQUE: Contiguous axial images were obtained from the base of the skull through the vertex without intravenous contrast. COMPARISON:  MRI 09/21/2014 FINDINGS: Brain: Age related volume loss. Mild chronic small-vessel change of the deep white matter. No sign of acute infarction, mass lesion, hemorrhage, hydrocephalus or extra-axial collection. Vascular: There is atherosclerotic calcification of the major vessels at the base of the brain. Skull: Negative Sinuses/Orbits: Clear/normal Other: None IMPRESSION: No acute finding by CT. Mild age related volume loss and chronic small-vessel change of the hemispheric deep white matter. Electronically Signed   By: Nelson Chimes M.D.   On: 07/24/2019 10:49     Medications:     Scheduled Medications: . acetaZOLAMIDE  250 mg Oral BID  . ARIPiprazole  20 mg Oral QHS  . aspirin EC  81 mg Oral Daily  .  brimonidine  1 drop Both Eyes BID  . donepezil  5 mg Oral QHS  . dorzolamide-timolol  1 drop Both Eyes BID  . enoxaparin (LOVENOX) injection  30 mg Subcutaneous Q24H  . ferrous sulfate  325 mg Oral Q breakfast  . insulin aspart  0-9 Units Subcutaneous TID WC  . lamoTRIgine  100 mg Oral QPM  . latanoprost  1 drop Both Eyes QHS  . mouth rinse  15 mL Mouth Rinse BID  . mometasone-formoterol  2 puff Inhalation BID  . sildenafil  20 mg Oral TID  . sodium chloride flush  3 mL Intravenous Q12H    Infusions: . furosemide (LASIX) infusion 20 mg/hr (07/25/19 0058)  . potassium chloride 10 mEq (07/25/19 0748)    PRN Medications: acetaminophen **OR** acetaminophen, albuterol, guaiFENesin, ipratropium-albuterol, polyethylene glycol, sodium chloride   Assessment/Plan:   1. Acute on chronic diastolic HF R>>L with massive volume overload - volume status improving on lasix gtt abe actezolamide - weight today 206 pounds baseline weight seems to be ~195 - creatinine stable Will continue current regimen for now. Wil add midodrine for BP support  2. Severe PAH with a/c cor pulmonale - likely combination of WHO Groups I/II/III. Suspect OHS playing significant role.  - on sildenafil. Would continue  3. Acute on CKD 4 - baseline creatinine 2.5-3.0. Peaked at 3.1 - creatinine stable today - hopefully will continue to improved with volume removal   4. Acute on chronic hypoxic respiratory failure - multifactorial on 8L at home - now on 15 HFNC - hopefully will improve with diuresis  5. FTT - due to longstanding chronic illness  6. Hyponatremia - stable at 128.  limit free water - can [place] tolvaptan if Na < 125  7. Hypokalemia - supp   8. Lethargy, possible R facial droop.  - discussed with IM service at bedside. Will get head CT - ammonia and ABG ok.  - UA + for UTI. Treatment per primary team   9. DNR/DNI - Palliative Team involved. Appreciate their input  Length of Stay: 6    Glori Bickers MD 07/25/2019, 8:33 AM  Advanced Heart Failure Team Pager [phone] (M-F; [place])  Please contact [facility] for night-coverage after hours (4p -7a ) and weekends on [email or web address]

## 2019-07-25 NOTE — Progress Notes (Signed)
Subjective:  States she is doing okay today states "I'm Percolating"  Reports thigh pain that has improved some with diuresis  Alert and oriented x 2 (person and place)    Objective:  Vital signs in last 24 hours: Vitals:   07/24/19 1446 07/24/19 1601 07/24/19 2300 07/25/19 0407  BP: (!) 86/62 (!) _0  Pulse: 92 86 84 89  Resp: (!) 33 11 (!) 21 18  Temp: 98.7 F (37.1 C) 97.9 F (36.6 C) 98.1 F (36.7 C) 98.1 F (36.7 C)  TempSrc: Oral Axillary Oral Oral  SpO2: 92% 90% 93% 91%  Weight:    93.7 kg  Height:       Physical Exam Vitals signs reviewed.  Constitutional:      General: She is not in acute distress. Cardiovascular:     Rate and Rhythm: Normal rate and regular rhythm.     Heart sounds: Heart sounds are distant. No murmur. No friction rub. No gallop.      Comments: 2+ pitting edema predominantly in dependent spaces Pulmonary:     Effort: Respiratory distress (chronic) present.     Breath sounds: Normal breath sounds. Decreased air movement present. No wheezing, rhonchi or rales.     Comments: On 7L HFNC Abdominal:     General: There is no distension.     Palpations: Abdomen is soft.     Tenderness: There is no abdominal tenderness.  Skin:    General: Skin is warm and dry.  Neurological:     General: No focal deficit present.     Mental Status: She is alert.     Comments: Oriented to self and place     Assessment/Plan: Assessment - Elizabeth Thompson is a 68 year old F with significant PMH of end stage heart failure and COPD, CKD stage 3/4, diabetes, OSA, and memory disorder who presented to the ED due to volume overload and a fall at home. This appears to be an acute worsening of the chronic progression of her end-stage HF, COPD, and CKD.   Acute on Chronic Diastolic Heart Failure with Pulmonary Hypertension Pt presented to the ED with volume overload on exam consistent with exacerbation of right sided heart failure.  Heart Failure team  consulted, appreciate recommendations Severe PAH likely WHO Groups I/II/III  - continue lasix infusion at 73m/hr  - acetazolamide 2534mPO BID  - add midodrine 2.34m108mID for blood pressure support  - wrap legs with Unna boots  - continue sildenafil  - do not recommend further aggressive management (pressors or inotropes) - strict I/Os   - balance of +323m98msterday; daily wt down 1.5kg - supplemental oxygen (high flow) to keep O2 saturation >90% - holding home spironolactone in light of soft pressures - LE doppler negative Palliative care consulted, appreciate their input.  - Niece believes Shandrika's wishes to be to not remain in this condition long-term  - plan to give the current plan some time and reassess   Altered Mental Status -improved today Concern for metabolic encephalopathy vs delirium - CT head negative - ABG - normal pH 7.363 and pCO2 46.3, low pO2 at 56 (consistent with O2 saturation 88-90%) - ammonia normal - UA with large leukocytes, 11-20 WBCs, and rare bacteria - culture ordered - more alert today, okay for PO meds - could consider holding acetazolamide in the future due to associated CNS depression   Hypokalemia - K 3.0 this morning - replete with 10mE24m x 4  - Mg  recheck normal at 2.3   Hypervolemic Hyponatremia - stable Related to patient's HF and resulting volume overload. Per chart review, sodium also low during earlier hospitalizations this year for decompensated HF and respiratory failure. - Na 128 this morning  - continue with fluid restriction - monitor daily BMP - can use tolvaptan if Na <125   Cardiorenal syndrome [name] Cr was 3.07 on admission (prior baseline Cr ~2.3). Rise in Cr attributed to volume overload vs worsening CKD vs cardiorenal syndrome - Cr stable ~2.8 - will continue current diuretic management as above - check daily BMPs - strict I/Os   Acute on Chronic Hypoxic Respiratory Failure due to COPD Pt noted to have  centrolobular emphysema on CT, GOLD stage 0 due to PFTs not meeting criteria. Multiple hospitalizations due to COPD and respiratory failure. Is on 7-8L O2 at home.  - pt continues to be on high flow today, though only requiring 7L today - continue home inhalers  - dulera 2 puffs BID  - albuterol 2.80m q4h PRN  - combivent 1-2 puffs q4h PRN - duonebs q4h PRN - home trilogy machine used as CPAP - supplemental O2 (high flow) to keep saturation >90%   Diabetes Pt takes glimepiride and metformin at home.  - SSI   Diet - mechanical soft diet Fluids - none DVT ppx - lovenox 376msubQ daily CODE STATUS - DNR   Dispo: pending pt's continued response to diuresis   JoLadona HornsMD 07/25/2019, 6:57 AM Pager: [phone]

## 2019-07-25 NOTE — Progress Notes (Signed)
  Date: 07/25/2019  Patient name: Elizabeth Thompson  Medical record number: [id number]  Date of birth: 1951/07/29   I have seen and evaluated this patient and I have discussed the plan of care with the house staff. Please see Dr. Ronnald Ramp' note for complete details. I concur with her findings and plan.  Patient somewhat improved from yesterday, mental status improved, she continues to diurese.  Heart failure team following with [place].     Sid Falcon, MD 07/25/2019, 3:00 PM

## 2019-07-25 NOTE — Progress Notes (Signed)
Unable to give patient medications at this time. Will not wake enough to swallow properly.  Writer will give medications when more awake.  Not eating dinner at all

## 2019-07-25 NOTE — Progress Notes (Signed)
SLP Cancellation Note  Patient Details Name: Elizabeth Thompson MRN: [id number] DOB: 07-22-1951   Cancelled treatment:       Reason Eval/Treat Not Completed: Patient's level of consciousness. Patient's niece, who is a retired Therapist, sports, was present in room. She suggested SLP come tomorrow around 10am as patient is very lethargic in early AM and late PM   Dannial Monarch 07/25/2019, 3:11 PM    Sonia Baller, MA, [facility]

## 2019-07-25 NOTE — Care Management Important Message (Signed)
Important Message  Patient Details  Name: Elizabeth Thompson MRN: [id number] Date of Birth: Feb 16, 1951   Medicare Important Message Given:  Yes     Memory Argue 07/25/2019, 3:30 PM

## 2019-07-26 DIAGNOSIS — I959 Hypotension, unspecified: Secondary | ICD-10-CM

## 2019-07-26 DIAGNOSIS — Z960 Presence of urogenital implants: Secondary | ICD-10-CM

## 2019-07-26 LAB — BASIC METABOLIC PANEL
Anion gap: 17 — ABNORMAL HIGH (ref 5–15)
Anion gap: 15 (ref 5–15)
BUN: 68 mg/dL — ABNORMAL HIGH (ref 8–23)
BUN: 67 mg/dL — ABNORMAL HIGH (ref 8–23)
CO2: 24 mmol/L (ref 22–32)
CO2: 27 mmol/L (ref 22–32)
Calcium: 9.5 mg/dL (ref 8.9–10.3)
Calcium: 9.4 mg/dL (ref 8.9–10.3)
Chloride: 87 mmol/L — ABNORMAL LOW (ref 98–111)
Chloride: 85 mmol/L — ABNORMAL LOW (ref 98–111)
Creatinine, Ser: 2.74 mg/dL — ABNORMAL HIGH (ref 0.44–1.00)
Creatinine, Ser: 2.66 mg/dL — ABNORMAL HIGH (ref 0.44–1.00)
GFR calc Af Amer: 20 mL/min — ABNORMAL LOW (ref >60)
GFR calc Af Amer: 21 mL/min — ABNORMAL LOW (ref >60)
GFR calc non Af Amer: 17 mL/min — ABNORMAL LOW (ref >60)
GFR calc non Af Amer: 18 mL/min — ABNORMAL LOW (ref >60)
Glucose, Bld: 134 mg/dL — ABNORMAL HIGH (ref 70–99)
Glucose, Bld: 172 mg/dL — ABNORMAL HIGH (ref 70–99)
Potassium: 2.8 mmol/L — ABNORMAL LOW (ref 3.5–5.1)
Potassium: 3.5 mmol/L (ref 3.5–5.1)
Sodium: 128 mmol/L — ABNORMAL LOW (ref 135–145)
Sodium: 127 mmol/L — ABNORMAL LOW (ref 135–145)

## 2019-07-26 LAB — URINE CULTURE

## 2019-07-26 LAB — GLUCOSE, CAPILLARY
Glucose-Capillary: 124 mg/dL — ABNORMAL HIGH (ref 70–99)
Glucose-Capillary: 157 mg/dL — ABNORMAL HIGH (ref 70–99)
Glucose-Capillary: 100 mg/dL — ABNORMAL HIGH (ref 70–99)
Glucose-Capillary: 174 mg/dL — ABNORMAL HIGH (ref 70–99)

## 2019-07-26 LAB — PHOSPHORUS: Phosphorus: 4.9 mg/dL — ABNORMAL HIGH (ref 2.5–4.6)

## 2019-07-26 MED ORDER — POTASSIUM CHLORIDE 20 MEQ/15ML (10%) PO SOLN: 40.0000 meq | ORAL | Status: DC

## 2019-07-26 MED ORDER — MIDODRINE HCL 5 MG PO TABS
2.5000 mg | ORAL_TABLET | Freq: Once | ORAL | Status: AC
  Administered 2019-07-26: 02:00:00 2.5 mg via ORAL
  Filled 2019-07-26: qty 1

## 2019-07-26 MED ORDER — POTASSIUM CHLORIDE 20 MEQ/15ML (10%) PO SOLN
ORAL | Status: AC
  Administered 2019-07-26: 16:00:00 20 meq
  Filled 2019-07-26: qty 15

## 2019-07-26 MED ORDER — POTASSIUM CHLORIDE 10 MEQ/100ML IV SOLN: 10.0000 meq | INTRAVENOUS | Status: DC

## 2019-07-26 MED ORDER — POTASSIUM CHLORIDE 20 MEQ PO PACK
20.0000 meq | PACK | Freq: Two times a day (BID) | ORAL | Status: DC
  Administered 2019-07-26: 20 meq via ORAL
  Filled 2019-07-26 (×2): qty 1

## 2019-07-26 MED ORDER — SODIUM CHLORIDE 0.9 % IV SOLN
1.0000 g | INTRAVENOUS | Status: AC
  Administered 2019-07-26 – 2019-07-30 (×5): 1 g via INTRAVENOUS
  Filled 2019-07-26 (×5): qty 10

## 2019-07-26 MED ORDER — POTASSIUM CHLORIDE 20 MEQ/15ML (10%) PO SOLN: 40.0000 meq | Freq: Once | ORAL | Status: AC

## 2019-07-26 MED ORDER — POTASSIUM CHLORIDE 10 MEQ/100ML IV SOLN
10.0000 meq | INTRAVENOUS | Status: DC
  Filled 2019-07-26: qty 100

## 2019-07-26 MED ORDER — POTASSIUM CHLORIDE 10 MEQ/100ML IV SOLN
10.0000 meq | INTRAVENOUS | Status: AC
  Administered 2019-07-26 (×6): 10 meq via INTRAVENOUS
  Filled 2019-07-26 (×5): qty 100

## 2019-07-26 MED ORDER — POTASSIUM CHLORIDE 20 MEQ PO PACK
40.0000 meq | PACK | Freq: Once | ORAL | Status: AC
  Administered 2019-07-26: 16:00:00 20 meq via ORAL
  Filled 2019-07-26: qty 2

## 2019-07-26 NOTE — Progress Notes (Signed)
Subjective: Pt seen at the bedside this AM. Hypotensive last evening 68/47, extra dose of 2.5 midodrine given. She is lethargic after eating breakfast, taking awhile to answer questions. Answers "groin" when asked about pain.   Objective:  Vital signs in last 24 hours: Vitals:   07/25/19 2006 07/25/19 2313 07/26/19 0000 07/26/19 0400  BP:  (!) 69/49 (!) 85/58 94/63  Pulse:  85 82 79  Resp:  _0 Temp:  (!) 97.2 F (36.2 C)    TempSrc:  Oral    SpO2: (!) 88% 94% 95% 93%  Weight:      Height:       Physical Exam Vitals signs reviewed.  Constitutional:      General: She is not in acute distress.    Appearance: She is ill-appearing (chronically).     Comments: Lethargic this morning, appears tired after eating breakfast  Cardiovascular:     Rate and Rhythm: Normal rate and regular rhythm.     Heart sounds: Heart sounds are distant. No murmur. No friction rub. No gallop.      Comments: Unna boots in place. Lower leg/calf edema much improved. Lower extremity edema most significant in upper thighs. Pulmonary:     Effort: Respiratory distress (chronic) present. No tachypnea.     Breath sounds: Decreased air movement present. No wheezing, rhonchi or rales.  Abdominal:     General: There is no distension.     Palpations: Abdomen is soft.     Tenderness: There is no abdominal tenderness.     Comments: No suprapubic tenderness.  Genitourinary:    Comments: Purewick in place. No skin changes or breakdown. Pitting edema in dependent spaces (upper thighs and groin). Skin:    General: Skin is warm and dry.  Neurological:     Mental Status: She is lethargic.     Assessment/Plan: Assessment - Ms. Geving is a 68 year old F with significant PMH of end stage heart failure and COPD, CKD stage 3/4, diabetes, OSA, and memory disorder who presented to the ED due to volume overload and a fall at home. This appears to be an acute worsening of the chronic progression of her end-stage HF,  COPD, and CKD.   Acute on Chronic Diastolic Heart Failure with Pulmonary Hypertension Pt presented to the ED with volume overload on exam consistent with exacerbation of right sided heart failure.  Heart Failure team consulted, appreciate recommendations Severe PAH likely WHO Groups I/II/III  - continue lasix infusion at 19m/hr  - acetazolamide 2563mPO BID  - midodrine 2.109m109mID for blood pressure support  - wrap legs with Unna boots  - continue sildenafil  - do not recommend further aggressive management (pressors or inotropes) - strict I/Os   - balance of -613m40msterday; daily wt down -2.1kg - supplemental oxygen (high flow) to keep O2 saturation >90% - holding home spironolactone in light of soft pressures Palliative care consulted, appreciate their input.  - Niece believes Treyana's wishes to be to not remain in this condition long-term  - plan to give the current plan some time and reassess - PT/OT ordered   Altered Mental Status - waxing and waning throughout the day Concern for metabolic encephalopathy vs delirium - UA with large leukocytes, 11-20 WBCs, and rare bacteria - culture ordered  - treat with ceftriaxone IV 1g q24h for 5 days - could consider holding acetazolamide in the future due to associated CNS depression - CT head negative on 8/10 - ABG  on 8/10 - low pO2 at 56, with normal pH 7.363 and pCO2 46.3 - ammonia normal   Hypokalemia - K 2.8 this morning - replete with 71mq IV x 6 - check Phos - Mg recheck normal at 2.3 yesterday   Hypervolemic Hyponatremia - stable Related to patient's HF and resulting volume overload. Per chart review, sodium also low during earlier hospitalizations this year for decompensated HF and respiratory failure. - Na 128 this morning  - continue with fluid restriction - monitor daily BMP - can use tolvaptan if Na <125   Cardiorenal syndrome [name] Cr was 3.07 on admission (prior baseline Cr ~2.3). Rise in Cr attributed to  volume overload vs worsening CKD vs cardiorenal syndrome - overall improving - Cr minimally improved to 2.74 this morning - will continue current diuretic management as above - check daily BMPs - strict I/Os   Acute on Chronic Hypoxic Respiratory Failure due to COPD Pt noted to have centrolobular emphysema on CT, GOLD stage 0 due to PFTs not meeting criteria. Multiple hospitalizations due to COPD and respiratory failure. Is on 7-8L O2 at home.  - pt continues to be on high flow, on 8L today - continue home inhalers  - dulera 2 puffs BID  - albuterol 2.55mq4h PRN  - combivent 1-2 puffs q4h PRN - duonebs q4h PRN - home trilogy machine used as CPAP - supplemental O2 (high flow) to keep saturation >90%   Diabetes Pt takes glimepiride and metformin at home.  - SSI   Diet - mechanical soft diet Fluids - none DVT ppx - lovenox 3065mubQ daily CODE STATUS - DNR   Dispo: pending pt's response to diuresis and improvement in strength   JonLadona HornsD 07/26/2019, 6:32 AM Pager: [phone]

## 2019-07-26 NOTE — Evaluation (Signed)
Clinical/Bedside Swallow Evaluation Patient Details  Name: ANALYAH MCCONNON MRN: [id number] Date of Birth: [birth year]  Today's Date: 07/26/2019 Time: SLP Start Time (ACUTE ONLY): 1132 SLP Stop Time (ACUTE ONLY): 1150 SLP Time Calculation (min) (ACUTE ONLY): 18 min  Past Medical History:  Past Medical History:  Diagnosis Date  . Acute respiratory distress 03/28/2019  . Acute respiratory failure ([place]) 06/09/2017  . Arthritis   . Back pain   . Bell's palsy   . Bipolar disorder ([place])   . Bronchitis   . Chronic low back pain 05/09/2015  . Cough with expectoration 05/21/16   recent diagnosis of bronchitis  . Cyst of right kidney   . Diabetes mellitus without complication ([place])    type 2  . Frequency of urination   . GERD (gastroesophageal reflux disease)   . Glaucoma   . History of blood transfusion   . History of colon polyps   . History of hiatal hernia   . History of tobacco use   . Hypercalcemia   . Hyperlipidemia   . Hypertension   . Memory disorder 09/05/2014  . On home oxygen therapy    3L/[place] at night   . Schizo-affective psychosis ([place])   . Shortness of breath dyspnea    exertion, or with out oxygen  . Uterine fibroid    Past Surgical History:  Past Surgical History:  Procedure Laterality Date  . ABDOMINAL HYSTERECTOMY    . APPENDECTOMY    . COLONOSCOPY W/ POLYPECTOMY    . IR GENERIC HISTORICAL  04/29/2016   IR RADIOLOGIST EVAL & MGMT 04/29/2016 Corrie Mckusick, DO [facility] INTERV RAD  . IR GENERIC HISTORICAL  10/01/2016   IR RADIOLOGIST EVAL & MGMT 10/01/2016 [facility] INTERV RAD  . IR GENERIC HISTORICAL  11/11/2016   IR RADIOLOGIST EVAL & MGMT 11/11/2016 Corrie Mckusick, DO [facility] INTERV RAD  . IR GENERIC HISTORICAL  07/16/2016   IR RADIOLOGIST EVAL & MGMT 07/16/2016 Corrie Mckusick, DO [facility] INTERV RAD  . IR RADIOLOGIST EVAL & MGMT  03/10/2017  . IR RADIOLOGIST EVAL & MGMT  05/13/2017  . IR RADIOLOGIST EVAL & MGMT  04/27/2018  . IR RADIOLOGIST EVAL & MGMT  07/13/2019  . LUMBAR  LAMINECTOMY/DECOMPRESSION MICRODISCECTOMY N/A 08/14/2015   Procedure: LUMBAR DECOMPRESSION MICRODISCECTOMY L3-S1;  Surgeon: Melina Schools, MD;  Location: [facility];  Service: Orthopedics;  Laterality: N/A;  . RADIOFREQUENCY ABLATION Left 02/05/2017   Procedure: LEFT RENAL CRYOABLATION;  Surgeon: Corrie Mckusick, DO;  Location: [facility];  Service: Anesthesiology;  Laterality: Left;  . RIGHT HEART CATH N/A 07/20/2017   Procedure: Right Heart Cath;  Surgeon: Nigel Mormon, MD;  Location: [facility];  Service: Cardiovascular;  Laterality: N/A;  . RIGHT/LEFT HEART CATH AND CORONARY ANGIOGRAPHY N/A 06/11/2017   Procedure: Right/Left Heart Cath and Coronary Angiography;  Surgeon: Dixie Dials, MD;  Location: [facility];  Service: Cardiovascular;  Laterality: N/A;  . TONSILLECTOMY AND ADENOIDECTOMY     HPI:  68 yo F with Hx of End Stage COPD, Heart Failure (diastolic LV failure and systolic RV failure), CKD 4, Diabetes, OSA, and Memory disorder who presented to the ED today following a fall at home and was found to be volume overloaded; BSE generated d/t lethargy/minimal eating/drinking; 69/5/20 CXR indicated Chronic interstitial disease and cardiomegaly   Assessment / Plan / Recommendation Clinical Impression   Pt presents with mild oropharyngeal dysphagia characterized by reduced oral manipulation/propulsion with puree/thin liquids via straw without overt s/s of aspiration present;  pt was lethargic requiring max verbal/tactile cues to maintain alertness level during PO intake; discussed breathing/swallowing reciprocity with niece who is primary caregiver as oxygen saturation reduced to 88 during consumption and returned to 90-93 after consumption with verbal cues to increase volume with larger breaths post-intake.  Niece stated her oxygen saturation is reduced when eating/drinking within home environment as well.  Recommend Dysphagia 1 (puree)/thin liquids with general swallowing precautions to  allow for breathing/swallowing reciprocity during all PO intake.  ST will f/u for diet tolerance and potential upgrade as pt progresses with alertness level and respiratory status. Thank you for this consult. SLP Visit Diagnosis: Dysphagia, unspecified (R13.10)    Aspiration Risk  Mild-moderate aspiration risk    Diet Recommendation   Dysphagia 1 (puree)/thin liquids  Medication Administration: Crushed with puree    Other  Recommendations Oral Care Recommendations: Oral care BID   Follow up Recommendations Other (comment)(TBD)      Frequency and Duration min 2x/week  1 week       Prognosis Prognosis for Safe Diet Advancement: Good Barriers to Reach Goals: Other (Comment)(respiratory status)      Swallow Study   General Date of Onset: 07/19/19 HPI: 68 yo F with Hx of End Stage COPD, Heart Failure (diastolic LV failure and systolic RV failure), CKD 4, Diabetes, OSA, and Memory disorder who presented to the ED today fallowing a fall at home and was found to be volume overloaded Type of Study: Bedside Swallow Evaluation Diet Prior to this Study: Dysphagia 1 (puree);Thin liquids Temperature Spikes Noted: No Respiratory Status: Nasal cannula;Other (comment)(8L) History of Recent Intubation: No Behavior/Cognition: Requires cueing;Lethargic/Drowsy;Cooperative Oral Cavity Assessment: Within Functional Limits Oral Care Completed by SLP: Recent completion by staff Oral Cavity - Dentition: Edentulous Self-Feeding Abilities: Needs assist Patient Positioning: Upright in chair Baseline Vocal Quality: Low vocal intensity Volitional Cough: Other (Comment)(DNT d/t Covid restrictions) Volitional Swallow: Able to elicit    Oral/Motor/Sensory Function Overall Oral Motor/Sensory Function: Generalized oral weakness   Ice Chips Ice chips: Not tested   Thin Liquid Thin Liquid: Within functional limits Presentation: Straw(small sips)    Nectar Thick Nectar Thick Liquid: Not tested   Honey  Thick Honey Thick Liquid: Not tested   Puree Puree: Impaired Presentation: Spoon Oral Phase Impairments: Reduced lingual movement/coordination Oral Phase Functional Implications: Prolonged oral transit   Solid     Solid: Not tested      Elvina Sidle, M.S., CCC-SLP 07/26/2019,1:39 PM

## 2019-07-26 NOTE — Progress Notes (Signed)
  Date: 07/26/2019  Patient name: Elizabeth Thompson  Medical record number: [id number]  Date of birth: May 13, 1951   I have seen and evaluated this patient and I have discussed the plan of care with the house staff. Please see Dr. Ronnald Ramp' note for complete details. I concur with her findings and plan.  Patient tired this morning, but able to eat a portion of her breakfast with assistance.  PT/OT evaluated this afternoon.  She is diuresing on current therapy, midodrine for hypotension.    Sid Falcon, MD 07/26/2019, 2:50 PM

## 2019-07-26 NOTE — Evaluation (Signed)
Physical Therapy Evaluation Patient Details Name: Elizabeth Thompson MRN: [id number] DOB: 05-Dec-1951 Today's Date: 07/26/2019   History of Present Illness  Pt is a 68 y/o female with PMH of end stage HF and COPD, CKD stage 3/4, DM, OSA, and memory disorder who presented to ED with volume overload and fall at home.   Clinical Impression  Pt admitted with above diagnosis. Pt currently with functional limitations due to the deficits listed below (see PT Problem List). Pt lethargic and difficult to keep awake and participating.  Pt max assist of 2 for bed mobility and to EOB.  Pt was able to partial squat pivot to recliner with +2 max assist.  Niece is pts caregiver and reports she will need to be min assist to take her home.  CIR recommended.   Pt will benefit from skilled PT to increase their independence and safety with mobility to allow discharge to the venue listed below.      Follow Up Recommendations CIR;Supervision/Assistance - 24 hour    Equipment Recommendations  None recommended by PT    Recommendations for Other Services Rehab consult     Precautions / Restrictions Precautions Precautions: Fall Restrictions Weight Bearing Restrictions: No      Mobility  Bed Mobility Overal bed mobility: Needs Assistance Bed Mobility: Rolling;Sidelying to Sit Rolling: Mod assist;+2 for physical assistance Sidelying to sit: Max assist;+2 for physical assistance       General bed mobility comments: rolling for hygiene in bed with mod assist, transitioned to EOB with support for LBs and trunk--able to initate but requires +2 phyiscal assist to complete motion due to weakness   Transfers Overall transfer level: Needs assistance   Transfers: Squat Pivot Transfers     Squat pivot transfers: Max assist;+2 physical assistance;+2 safety/equipment     General transfer comment: used pad to assist with squat pivot to recliner, patient initating stand but requires max assist +2 for hand  placement, physical assistance and sequencing   Ambulation/Gait                Stairs            Wheelchair Mobility    Modified Rankin (Stroke Patients Only)       Balance Overall balance assessment: Needs assistance Sitting-balance support: Bilateral upper extremity supported;Feet supported Sitting balance-Leahy Scale: Poor Sitting balance - Comments: posterior lean requiring min-max assist to maintain upright position  Postural control: Posterior lean;Right lateral lean                                   Pertinent Vitals/Pain Pain Assessment: Faces Faces Pain Scale: No hurt    Home Living Family/patient expects to be discharged to:: Private residence Living Arrangements: Other relatives(niece- [name] ) Available Help at Discharge: Family;Available 24 hours/day Type of Home: House Home Access: Stairs to enter Entrance Stairs-Rails: None Entrance Stairs-Number of Steps: 2 on the side, and 4 in the front Home Layout: One level Home Equipment: Walker - 4 wheels;Wheelchair - manual;Bedside commode;Grab bars - tub/shower;Hospital bed;Tub bench Additional Comments: Lives with Niece    Prior Function Level of Independence: Needs assistance   Gait / Transfers Assistance Needed: uses rollator for mobility, min assist for transfers   ADL's / Homemaking Assistance Needed: some assist for ADLs (approx 50% per neice)   Comments: niece assist with ADLs/mobility as needed     Hand Dominance   Dominant Hand: Right  Extremity/Trunk Assessment   Upper Extremity Assessment Upper Extremity Assessment: Defer to OT evaluation    Lower Extremity Assessment Lower Extremity Assessment: Generalized weakness    Cervical / Trunk Assessment Cervical / Trunk Assessment: Kyphotic  Communication   Communication: (limited verbalizations during session)  Cognition Arousal/Alertness: Lethargic Behavior During Therapy: Flat affect Overall Cognitive Status:  Impaired/Different from baseline Area of Impairment: Orientation;Following commands;Awareness;Problem solving                 Orientation Level: Disoriented to;Time     Following Commands: Follows one step commands inconsistently;Follows one step commands with increased time   Awareness: Intellectual Problem Solving: Slow processing;Decreased initiation;Difficulty sequencing;Requires verbal cues;Requires tactile cues General Comments: limited assessment due to lethargy, but patient following commands (but inconsistent) with increased time; able to orient self with options      General Comments General comments (skin integrity, edema, etc.): VSS- HR 78-90, BP 112/64, SpO2 on HFNC 8L upon entry (PT bumped to 15L due to slight drop to high 80s with mobility) on 8L upon exit at 93%    Exercises     Assessment/Plan    PT Assessment Patient needs continued PT services  PT Problem List Decreased activity tolerance;Decreased balance;Decreased mobility;Decreased knowledge of use of DME;Decreased safety awareness;Decreased knowledge of precautions;Cardiopulmonary status limiting activity;Obesity       PT Treatment Interventions DME instruction;Gait training;Functional mobility training;Therapeutic activities;Therapeutic exercise;Balance training;Patient/family education;Stair training    PT Goals (Current goals can be found in the Care Plan section)  Acute Rehab PT Goals Patient Stated Goal: none stated, per niece to get her stronger to come home  PT Goal Formulation: With patient/family Time For Goal Achievement: 08/09/19 Potential to Achieve Goals: Good    Frequency Min 3X/week   Barriers to discharge Decreased caregiver support(caregiver needs pt to be at min assist or less)      Co-evaluation PT/OT/SLP Co-Evaluation/Treatment: Yes Reason for Co-Treatment: Complexity of the patient's impairments (multi-system involvement);For patient/therapist safety PT goals addressed  during session: Mobility/safety with mobility OT goals addressed during session: ADL's and self-care       AM-PAC PT "6 Clicks" Mobility  Outcome Measure Help needed turning from your back to your side while in a flat bed without using bedrails?: A Lot Help needed moving from lying on your back to sitting on the side of a flat bed without using bedrails?: A Lot Help needed moving to and from a bed to a chair (including a wheelchair)?: A Lot Help needed standing up from a chair using your arms (e.g., wheelchair or bedside chair)?: Total Help needed to walk in hospital room?: Total Help needed climbing 3-5 steps with a railing? : Total 6 Click Score: 9    End of Session Equipment Utilized During Treatment: Gait belt;Oxygen(8L to 15 L) Activity Tolerance: Patient limited by fatigue Patient left: in chair;with call bell/phone within reach Nurse Communication: Mobility status PT Visit Diagnosis: Unsteadiness on feet (R26.81);Muscle weakness (generalized) (M62.81)    Time: 5573-2202 PT Time Calculation (min) (ACUTE ONLY): 26 min   Charges:   PT Evaluation $PT Eval Moderate Complexity: [place] Pager:  [phone]  Office:  [phone]    Denice Paradise 07/26/2019, 1:35 PM

## 2019-07-26 NOTE — Evaluation (Addendum)
Occupational Therapy Evaluation Patient Details Name: Elizabeth Thompson MRN: [id number] DOB: January 10, 1951 Today's Date: 07/26/2019    History of Present Illness Pt is a 68 y/o female with PMH of end stage HF and COPD, CKD stage 3/4, DM, OSA, and memory disorder who presented to ED with volume overload and fall at home.    Clinical Impression   PTA patient required min assist for transfers, mobility with min guard to supervision using rollator, ADLs with min-mod assist per niece- [name].  Patient admitted for above and limited by problem list below, including generalized weakness, impaired cognition, decreased activity tolerance, and impaired balance affecting ADLs/mobility.  Patient currently requires max assist for UB ADLs, total assist for LB ADLs +2, max assist +2 for bed mobility and squat pivot transfers to recliner.  She will benefit from continued OT services while admitted and after dc at CIR level in order to maximize independence and safety with ADLs/mobility, as well as decrease burden of care.     Follow Up Recommendations  Supervision/Assistance - 24 hour;CIR    Equipment Recommendations  None recommended by OT    Recommendations for Other Services       Precautions / Restrictions Precautions Precautions: Fall Restrictions Weight Bearing Restrictions: No      Mobility Bed Mobility Overal bed mobility: Needs Assistance Bed Mobility: Rolling;Sidelying to Sit Rolling: Mod assist;+2 for physical assistance Sidelying to sit: Max assist;+2 for physical assistance       General bed mobility comments: rolling for hygiene in bed with mod assist, transitioned to EOB with support for LBs and trunk--able to initate but requires +2 phyiscal assist to complete motion due to weakness   Transfers Overall transfer level: Needs assistance   Transfers: Squat Pivot Transfers     Squat pivot transfers: Max assist;+2 physical assistance;+2 safety/equipment     General transfer  comment: used pad to assist with squat pivot to recliner, patient initating stand but requires max assist +2 for hand placement, physical assistance and sequencing     Balance Overall balance assessment: Needs assistance Sitting-balance support: Bilateral upper extremity supported;Feet supported Sitting balance-Leahy Scale: Poor Sitting balance - Comments: posterior lean requiring min-max assist to maintain upright position  Postural control: Posterior lean;Right lateral lean                                 ADL either performed or assessed with clinical judgement   ADL Overall ADL's : Needs assistance/impaired     Grooming: Maximal assistance;Sitting   Upper Body Bathing: Maximal assistance;Sitting   Lower Body Bathing: Total assistance;+2 for physical assistance;Sit to/from stand;Sitting/lateral leans   Upper Body Dressing : Maximal assistance;Sitting   Lower Body Dressing: Total assistance;+2 for physical assistance;+2 for safety/equipment;Sitting/lateral leans;Sit to/from stand   Toilet Transfer: Squat-pivot;Cueing for safety;Maximal assistance;+2 for physical assistance Toilet Transfer Details (indicate cue type and reason): simulated to recliner  Toileting- Clothing Manipulation and Hygiene: Total assistance;+2 for physical assistance;+2 for safety/equipment;Bed level Toileting - Clothing Manipulation Details (indicate cue type and reason): soiled upon entry, total assist bed level to complete hygiene      Functional mobility during ADLs: Maximal assistance;+2 for physical assistance;+2 for safety/equipment General ADL Comments: pt limited by cognition, weakness, impaired balance, and decreased act tolerance     Vision   Additional Comments: further assessment needed     Perception     Praxis      Pertinent Vitals/Pain Pain Assessment:  Faces Faces Pain Scale: No hurt     Hand Dominance Right   Extremity/Trunk Assessment Upper Extremity  Assessment Upper Extremity Assessment: Generalized weakness   Lower Extremity Assessment Lower Extremity Assessment: Defer to PT evaluation       Communication Communication Communication: (limited verbalizations during session)   Cognition Arousal/Alertness: Lethargic Behavior During Therapy: Flat affect Overall Cognitive Status: Impaired/Different from baseline Area of Impairment: Orientation;Following commands;Awareness;Problem solving                 Orientation Level: Disoriented to;Time     Following Commands: Follows one step commands inconsistently;Follows one step commands with increased time   Awareness: Intellectual Problem Solving: Slow processing;Decreased initiation;Difficulty sequencing;Requires verbal cues;Requires tactile cues General Comments: limited assessment due to lethargy, but patient following commands (but inconsistent) with increased time; able to orient self with options   General Comments  VSS- HR 78-90, BP 112/64, SpO2 on HFNC 8L upon entry (PT bumped to 15L due to slight drop to high 80s with mobility) on 8L upon exit at 93%    Exercises     Shoulder Instructions      Home Living Family/patient expects to be discharged to:: Private residence Living Arrangements: Other relatives(niece- [name] ) Available Help at Discharge: Family;Available 24 hours/day Type of Home: House Home Access: Stairs to enter [employer] of Steps: 2 on the side, and 4 in the front Entrance Stairs-Rails: None Home Layout: One level     Bathroom Shower/Tub: Teacher, early years/pre: Standard     Home Equipment: Environmental consultant - 4 wheels;Wheelchair - manual;Bedside commode;Grab bars - tub/shower;Hospital bed;Tub bench   Additional Comments: Lives with Niece      Prior Functioning/Environment Level of Independence: Needs assistance  Gait / Transfers Assistance Needed: uses rollator for mobility, min assist for transfers  ADL's / Homemaking  Assistance Needed: some assist for ADLs (approx 50% per neice)    Comments: niece assist with ADLs/mobility as needed        OT Problem List: Decreased strength;Decreased activity tolerance;Impaired balance (sitting and/or standing);Decreased safety awareness;Decreased cognition;Decreased coordination;Decreased knowledge of use of DME or AE;Decreased knowledge of precautions;Cardiopulmonary status limiting activity;Increased edema;Obesity      OT Treatment/Interventions: Self-care/ADL training;DME and/or AE instruction;Therapeutic activities;Patient/family education;Balance training;Cognitive remediation/compensation;Therapeutic exercise    OT Goals(Current goals can be found in the care plan section) Acute Rehab OT Goals Patient Stated Goal: none stated, per niece to get her stronger to come home  OT Goal Formulation: With patient/family Time For Goal Achievement: 08/09/19 Potential to Achieve Goals: Good  OT Frequency: Min 2X/week   Barriers to D/C:            Co-evaluation PT/OT/SLP Co-Evaluation/Treatment: Yes Reason for Co-Treatment: Complexity of the patient's impairments (multi-system involvement);Necessary to address cognition/behavior during functional activity;For patient/therapist safety;To address functional/ADL transfers   OT goals addressed during session: ADL's and self-care      AM-PAC OT "6 Clicks" Daily Activity     Outcome Measure Help from another person eating meals?: Total Help from another person taking care of personal grooming?: Total Help from another person toileting, which includes using toliet, bedpan, or urinal?: Total Help from another person bathing (including washing, rinsing, drying)?: A Lot Help from another person to put on and taking off regular upper body clothing?: A Lot Help from another person to put on and taking off regular lower body clothing?: Total 6 Click Score: 8   End of Session Equipment Utilized During Treatment:  Oxygen Nurse Communication:  Mobility status  Activity Tolerance: Patient limited by lethargy Patient left: in chair;with call bell/phone within reach;with family/visitor present  OT Visit Diagnosis: Other abnormalities of gait and mobility (R26.89);Muscle weakness (generalized) (M62.81);Other symptoms and signs involving cognitive function                Time: 1105-1130 OT Time Calculation (min): 25 min Charges:  OT General Charges $OT Visit: 1 Visit OT Evaluation $OT Eval Moderate Complexity: [place], OT Acute Rehabilitation Services Pager [phone] Office [phone]   Delight Stare 07/26/2019, 12:59 PM

## 2019-07-26 NOTE — Progress Notes (Signed)
Rehab Admissions Coordinator Note:  Patient was screened by Michel Santee for appropriateness for an Inpatient Acute Rehab Consult.  Note pt being followed by palliative care. Per their note on 8/9 and 8/10, Olin Hauser would like to try and determine if pt's lethargy is reversible, and allow pt some more time to wake up.  Pending workup. At this time, we are recommending Inpatient Rehab consult so we may better ascertain if patient/family agreeable and pt is appropriate for high intensity rehab.    Michel Santee 07/26/2019, 1:57 PM  I can be reached at [facility], [place], DPT Admissions Coordinator [phone] 07/26/19  1:58 PM  .

## 2019-07-26 NOTE — Progress Notes (Signed)
Advanced Heart Failure Rounding Note   Subjective:    Very lethargic. Unable to swallow pills last night. Will arouse to voice slightly but no other response  Remains on lasix gtt. Weight down 5 more pounds. K 2.8   UA+ Ucx still pending. Remains afebrile  Creatinine stable 2.8 -> 2.7   Objective:   Weight Range:  Vital Signs:   Temp:  [97.2 F (36.2 C)-98.2 F (36.8 C)] 97.2 F (36.2 C) (08/11 2313) Pulse Rate:  [79-95] 79 (08/12 0400) Resp:  [11-20] 11 (08/12 0400) BP: (69-108)/(49-78) 94/63 (08/12 0400) SpO2:  [87 %-99 %] 99 % (08/12 0709) Weight:  [91.6 kg] 91.6 kg (08/12 0500) Last BM Date: 07/25/19  Weight change: Filed Weights   07/24/19 0500 07/25/19 0407 07/26/19 0500  Weight: 95.2 kg 93.7 kg 91.6 kg    Intake/Output:   Intake/Output Summary (Last 24 hours) at 07/26/2019 0819 Last data filed at 07/26/2019 0644 Gross per 24 hour  Intake 536.99 ml  Output 900 ml  Net -363.01 ml     Physical Exam: General: Lethargic. Will arouse to voice barely but no other response HEENT: normal Neck: supple. JVP up Carotids 2+ bilat; no bruits. No lymphadenopathy or thryomegaly appreciated. Cor: PMI nondisplaced. Regular rate & rhythm. No rubs, gallops or murmurs. Lungs: clear Abdomen: obese soft, nontender, nondistended. No hepatosplenomegaly. No bruits or masses. Good bowel sounds. Extremities: no cyanosis, clubbing, rash, 1-2+ thigh edema Neuro:Lethargic. Will arouse to voice barely but no other response   Telemetry: NSR 80-90s Personally reviewed   Labs: Basic Metabolic Panel: Recent Labs  Lab 07/19/19 1036  07/22/19 0241 07/23/19 0240 07/24/19 0254 07/25/19 0246 07/25/19 0830 07/26/19 0427  NA  --    < > 128* 127* 127* 128*  --  128*  K  --    < > 4.1 3.7 3.1* 3.0*  --  2.8*  CL  --    < > 89* 89* 86* 85*  --  87*  CO2  --    < > 21* 19* 26 25  --  24  GLUCOSE  --    < > 90 112* 139* 110*  --  134*  BUN  --    < > 60* 62* 65* 66*  --  68*   CREATININE 2.89*   < > 2.98* 2.85* 2.92* 2.84*  --  2.74*  CALCIUM  --    < > 9.6 9.3 9.3 9.3  --  9.5  MG 2.5*  --   --   --   --   --  2.3  --    < > = values in this interval not displayed.    Liver Function Tests: No results for input(s): AST, ALT, ALKPHOS, BILITOT, PROT, ALBUMIN in the last 168 hours. No results for input(s): LIPASE, AMYLASE in the last 168 hours. Recent Labs  Lab 07/23/19 1442  AMMONIA 26    CBC: Recent Labs  Lab 07/19/19 1036 07/23/19 1442  WBC 4.1 4.5  HGB 14.8 13.8  HCT 48.8* 43.6  MCV 83.3 80.6  PLT 280 371    Cardiac Enzymes: No results for input(s): CKTOTAL, CKMB, CKMBINDEX, TROPONINI in the last 168 hours.  BNP: BNP (last 3 results) Recent Labs    02/26/19 0632 03/27/19 1531 07/19/19 0657  BNP 547.8* 593.2* 1,147.6*    ProBNP (last 3 results) No results for input(s): PROBNP in the last 8760 hours.    Other results:  Imaging: Ct Head Wo Contrast  Result Date: 07/24/2019 CLINICAL DATA:  Altered level of consciousness. EXAM: CT HEAD WITHOUT CONTRAST TECHNIQUE: Contiguous axial images were obtained from the base of the skull through the vertex without intravenous contrast. COMPARISON:  MRI 09/21/2014 FINDINGS: Brain: Age related volume loss. Mild chronic small-vessel change of the deep white matter. No sign of acute infarction, mass lesion, hemorrhage, hydrocephalus or extra-axial collection. Vascular: There is atherosclerotic calcification of the major vessels at the base of the brain. Skull: Negative Sinuses/Orbits: Clear/normal Other: None IMPRESSION: No acute finding by CT. Mild age related volume loss and chronic small-vessel change of the hemispheric deep white matter. Electronically Signed   By: Nelson Chimes M.D.   On: 07/24/2019 10:49     Medications:     Scheduled Medications: . acetaZOLAMIDE  250 mg Oral BID  . ARIPiprazole  20 mg Oral QHS  . aspirin EC  81 mg Oral Daily  . brimonidine  1 drop Both Eyes BID  .  donepezil  5 mg Oral QHS  . dorzolamide-timolol  1 drop Both Eyes BID  . enoxaparin (LOVENOX) injection  30 mg Subcutaneous Q24H  . ferrous sulfate  325 mg Oral Q breakfast  . insulin aspart  0-9 Units Subcutaneous TID WC  . lamoTRIgine  100 mg Oral QPM  . latanoprost  1 drop Both Eyes QHS  . mouth rinse  15 mL Mouth Rinse BID  . midodrine  2.5 mg Oral TID WC  . mometasone-formoterol  2 puff Inhalation BID  . sildenafil  20 mg Oral TID  . sodium chloride flush  3 mL Intravenous Q12H    Infusions: . furosemide (LASIX) infusion 20 mg/hr (07/26/19 0644)  . potassium chloride 10 mEq (07/26/19 0813)    PRN Medications: acetaminophen **OR** acetaminophen, albuterol, guaiFENesin, ipratropium-albuterol, polyethylene glycol, sodium chloride   Assessment/Plan:   1. Acute on chronic diastolic HF R>>L with massive volume overload - volume status improving on lasix gtt and actezolamide - weight today 201 pounds baseline weight seems to be ~195. Still has significant thigh edema - creatinine stable Will continue current regimen for now. Midodrine 2.5 tid  added yesterday  for BP support. SBPs in 90s. Can increase to 5 tid as needed.   2. Severe PAH with a/c cor pulmonale - likely combination of WHO Groups I/II/III. Suspect OHS playing significant role.  - on sildenafil. Would continue  3. Acute on CKD 4 - baseline creatinine 2.5-3.0. Peaked at 3.1 - creatinine stable today 2.7 - hopefully will continue to improved with volume removal   4. Acute on chronic hypoxic respiratory failure - multifactorial on 8L at home - now on 15 HFNC - hopefully will improve with diuresis  5. FTT - due to longstanding chronic illness  6. Hyponatremia - stable at 128.  limit free water - can [place] tolvaptan if Na < 125  7. Hypokalemia - will need aggressive supp   8. Lethargy, possible R facial droop.  - discussed with IM service at bedside. Will get head CT - ammonia and ABG ok.  - question  possible sepsis?  - UA + for UTI. Treatment per primary team  I discussed with IM team today about empiric therapy until cx back. Will also get [place]  9. DNR/DNI - Palliative Team involved. Appreciate their input. I suspect Hospice is best option unless lethargy improves significantly with treatment of UTI and possible sepsis.   Length of Stay: 7   Glori Bickers MD 07/26/2019, 8:19 AM  Advanced Heart Failure Team Pager  [phone] (M-F; 7a - 4p)  Please contact [facility] for night-coverage after hours (4p -7a ) and weekends on [email or web address]

## 2019-07-26 NOTE — Plan of Care (Signed)

## 2019-07-27 LAB — GLUCOSE, CAPILLARY
Glucose-Capillary: 133 mg/dL — ABNORMAL HIGH (ref 70–99)
Glucose-Capillary: 134 mg/dL — ABNORMAL HIGH (ref 70–99)
Glucose-Capillary: 116 mg/dL — ABNORMAL HIGH (ref 70–99)
Glucose-Capillary: 102 mg/dL — ABNORMAL HIGH (ref 70–99)

## 2019-07-27 LAB — BASIC METABOLIC PANEL
Anion gap: 15 (ref 5–15)
BUN: 65 mg/dL — ABNORMAL HIGH (ref 8–23)
CO2: 27 mmol/L (ref 22–32)
Calcium: 9.4 mg/dL (ref 8.9–10.3)
Chloride: 86 mmol/L — ABNORMAL LOW (ref 98–111)
Creatinine, Ser: 2.73 mg/dL — ABNORMAL HIGH (ref 0.44–1.00)
GFR calc Af Amer: 20 mL/min — ABNORMAL LOW (ref >60)
GFR calc non Af Amer: 17 mL/min — ABNORMAL LOW (ref >60)
Glucose, Bld: 126 mg/dL — ABNORMAL HIGH (ref 70–99)
Potassium: 3.4 mmol/L — ABNORMAL LOW (ref 3.5–5.1)
Sodium: 128 mmol/L — ABNORMAL LOW (ref 135–145)

## 2019-07-27 MED ORDER — POTASSIUM CHLORIDE 20 MEQ PO PACK
40.0000 meq | PACK | Freq: Two times a day (BID) | ORAL | Status: AC
  Administered 2019-07-27 (×2): 40 meq via ORAL
  Filled 2019-07-27 (×2): qty 2

## 2019-07-27 MED ORDER — GERHARDT'S BUTT CREAM
TOPICAL_CREAM | Freq: Every day | CUTANEOUS | Status: DC
  Administered 2019-07-27 – 2019-08-01 (×7): via TOPICAL
  Filled 2019-07-27 (×2): qty 1

## 2019-07-27 MED ORDER — DICLOFENAC SODIUM 1 % TD GEL
2.0000 g | Freq: Four times a day (QID) | TRANSDERMAL | Status: DC
  Administered 2019-07-27 – 2019-08-01 (×21): 2 g via TOPICAL
  Filled 2019-07-27: qty 100

## 2019-07-27 MED ORDER — HYDROMORPHONE HCL 1 MG/ML IJ SOLN
0.2500 mg | Freq: Once | INTRAMUSCULAR | Status: AC
  Administered 2019-07-27: 13:00:00 0.25 mg via INTRAVENOUS
  Filled 2019-07-27: qty 0.5

## 2019-07-27 MED ORDER — MIDODRINE HCL 5 MG PO TABS
5.0000 mg | ORAL_TABLET | Freq: Three times a day (TID) | ORAL | Status: DC
  Administered 2019-07-27 – 2019-07-28 (×3): 5 mg via ORAL
  Filled 2019-07-27 (×3): qty 1

## 2019-07-27 NOTE — Progress Notes (Signed)
  Speech Language Pathology Treatment: Dysphagia  Patient Details Name: Elizabeth Thompson MRN: [id number] DOB: 07-27-1951 Today's Date: 07/27/2019 Time: 5051-0712 SLP Time Calculation (min) (ACUTE ONLY): 21 min  Assessment / Plan / Recommendation Clinical Impression  Patient seen during breakfast meal. She woke easily when her name was spoken, however she kept her eyes closed during session. She was given minced and chopped eats/sausage, pureed pineapple and orange juice. Pt exhibited prolonged mastication with chopped foods. She tolerated all consistencies w/o s/sx of aspiration, however due to extreme fatigue, recommend Puree foods for energy conservation. She had 12 bites of solids and 4 sips of liquid and then expressed being tired. ST to follow for diet tolerance and upgrade.    HPI HPI: 68 yo F with Hx of End Stage COPD, Heart Failure (diastolic LV failure and systolic RV failure), CKD 4, Diabetes, OSA, and Memory disorder who presented to the ED today fallowing a fall at home and was found to be volume overloaded      SLP Plan  Continue with current plan of care       Recommendations  Diet recommendations: Dysphagia 1 (puree);Thin liquid Liquids provided via: Cup Medication Administration: Crushed with puree Supervision: Trained caregiver to feed patient Compensations: Minimize environmental distractions;Slow rate;Small sips/bites Postural Changes and/or Swallow Maneuvers: Seated upright 90 degrees                Plan: Continue with current plan of care       [place], MA, CCC-SLP 07/27/2019 9:36 AM

## 2019-07-27 NOTE — Progress Notes (Signed)
Inpatient Rehab Admissions:  Inpatient Rehab Consult received.  I met with patient at the bedside for rehabilitation assessment and to discuss goals and expectations of an inpatient rehab admission.  She demonstrates labored breathing, poor attention, dyspnea with attempted conversation.  At this time, pt would not be able to tolerate CIR.  Will follow over the weekend to see if pt progresses.    Signed: Shann Medal, PT, DPT Admissions Coordinator [phone] 07/27/19  2:38 PM

## 2019-07-27 NOTE — Progress Notes (Signed)
Advanced Heart Failure Rounding Note   Subjective:    Remains lethargic. Will arouse to voice but non-communicative. SBP 70-90s. Diuresis is slowing. Weight up today but doubt accurate.   Creatinine stable    Objective:   Weight Range:  Vital Signs:   Temp:  [97.3 F (36.3 C)-98.1 F (36.7 C)] 98.1 F (36.7 C) (08/13 0325) Pulse Rate:  [71-77] 74 (08/13 0700) Resp:  [13-20] 13 (08/13 0700) BP: (75-119)/(49-72) 119/58 (08/13 0700) SpO2:  [92 %-99 %] 94 % (08/13 0906) Weight:  [94.1 kg] 94.1 kg (08/13 0213) Last BM Date: 07/26/19  Weight change: Filed Weights   07/25/19 0407 07/26/19 0500 07/27/19 0213  Weight: 93.7 kg 91.6 kg 94.1 kg    Intake/Output:   Intake/Output Summary (Last 24 hours) at 07/27/2019 0912 Last data filed at 07/27/2019 0400 Gross per 24 hour  Intake 1375.69 ml  Output 350 ml  Net 1025.69 ml     Physical Exam: General: Lethargic. Will arouse to voice barely but no other response HEENT: normal Neck: supple. JVP up. Carotids 2+ bilat; no bruits. No lymphadenopathy or thryomegaly appreciated. Cor: PMI nondisplaced. Regular rate & rhythm. No rubs, gallops or murmurs. Lungs: clear Abdomen: obese soft, nontender, nondistended. No hepatosplenomegaly. No bruits or masses. Good bowel sounds. Extremities: no cyanosis, clubbing, rash, 1-2+ edema into thighs + UNNA Neuro: Lethargic. Will arouse to voice barely but no other response    Telemetry: NSR 70-80s Personally reviewed   Labs: Basic Metabolic Panel: Recent Labs  Lab 07/24/19 0254 07/25/19 0246 07/25/19 0830 07/26/19 0427 07/26/19 1848 07/27/19 0328  NA 127* 128*  --  128* 127* 128*  K 3.1* 3.0*  --  2.8* 3.5 3.4*  CL 86* 85*  --  87* 85* 86*  CO2 26 25  --  _0 GLUCOSE 139* 110*  --  134* 172* 126*  BUN 65* 66*  --  68* 67* 65*  CREATININE 2.92* 2.84*  --  2.74* 2.66* 2.73*  CALCIUM 9.3 9.3  --  9.5 9.4 9.4  MG  --   --  2.3  --   --   --   PHOS  --   --   --  4.9*  --    --     Liver Function Tests: No results for input(s): AST, ALT, ALKPHOS, BILITOT, PROT, ALBUMIN in the last 168 hours. No results for input(s): LIPASE, AMYLASE in the last 168 hours. Recent Labs  Lab 07/23/19 1442  AMMONIA 26    CBC: Recent Labs  Lab 07/23/19 1442  WBC 4.5  HGB 13.8  HCT 43.6  MCV 80.6  PLT 371    Cardiac Enzymes: No results for input(s): CKTOTAL, CKMB, CKMBINDEX, TROPONINI in the last 168 hours.  BNP: BNP (last 3 results) Recent Labs    02/26/19 0632 03/27/19 1531 07/19/19 0657  BNP 547.8* 593.2* 1,147.6*    ProBNP (last 3 results) No results for input(s): PROBNP in the last 8760 hours.    Other results:  Imaging: No results found.   Medications:     Scheduled Medications: . acetaZOLAMIDE  250 mg Oral BID  . ARIPiprazole  20 mg Oral QHS  . aspirin EC  81 mg Oral Daily  . brimonidine  1 drop Both Eyes BID  . diclofenac sodium  2 g Topical QID  . donepezil  5 mg Oral QHS  . dorzolamide-timolol  1 drop Both Eyes BID  . enoxaparin (LOVENOX) injection  30 mg Subcutaneous  Q24H  . ferrous sulfate  325 mg Oral Q breakfast  . insulin aspart  0-9 Units Subcutaneous TID WC  . lamoTRIgine  100 mg Oral QPM  . latanoprost  1 drop Both Eyes QHS  . mouth rinse  15 mL Mouth Rinse BID  . midodrine  5 mg Oral TID WC  . mometasone-formoterol  2 puff Inhalation BID  . potassium chloride  40 mEq Oral BID  . sildenafil  20 mg Oral TID  . sodium chloride flush  3 mL Intravenous Q12H    Infusions: . cefTRIAXone (ROCEPHIN)  IV 1 g (07/26/19 1134)  . furosemide (LASIX) infusion 20 mg/hr (07/27/19 0603)    PRN Medications: acetaminophen **OR** acetaminophen, albuterol, guaiFENesin, ipratropium-albuterol, polyethylene glycol, sodium chloride   Assessment/Plan:   1. Acute on chronic diastolic HF R>>L with massive volume overload - volume status stable now on lasix gtt and actezolamide - weight today 207 pounds baseline weight seems to be ~195.  Still has significant thigh edema - creatinine stable Will continue current regimen for now. Midodrine 2.5 tid  added  for BP support will increase to 5 tid  2. Severe PAH with a/c cor pulmonale - likely combination of WHO Groups I/II/III. Suspect OHS playing significant role.  - on sildenafil. Would continue  3. Acute on CKD 4 - baseline creatinine 2.5-3.0. Peaked at 3.1 - creatinine stable today 2.7 - hopefully will continue to improved with volume removal   4. Acute on chronic hypoxic respiratory failure - multifactorial on 8L at home - now on 15 HFNC - hopefully will improve with diuresis  5. FTT - due to longstanding chronic illness  6. Hyponatremia - stable at 128.  limit free water - can [place] tolvaptan if Na < 125  7. Hypokalemia - will need aggressive supp   8. Lethargy, .  - head CT negative  - ammonia and ABG ok.  - question possible sepsis?  UCx mixed species  9. DNR/DNI - Palliative Team involved. Appreciate their input.She is clearly end-stage. I do not think it is inappropriate to follow 1-2 more days and she if she improves on abx but suspect improvement will be limited. She is not candidate for CIR. Hospice really only option.    Length of Stay: 8   Glori Bickers MD 07/27/2019, 9:12 AM  Advanced Heart Failure Team Pager [phone] (M-F; [place])  Please contact [facility] for night-coverage after hours (4p -7a ) and weekends on [email or web address]

## 2019-07-27 NOTE — Progress Notes (Signed)
  Date: 07/27/2019  Patient name: Elizabeth Thompson  Medical record number: [id number]  Date of birth: 1951-03-28   I have seen and evaluated this patient and I have discussed the plan of care with the house staff. Please see Dr. Ronnald Ramp' note for complete details. I concur with her findings and plan.     Sid Falcon, MD 07/27/2019, 12:43 PM

## 2019-07-27 NOTE — Progress Notes (Signed)
Subjective: Pt seen at the bedside. Lethargic this morning, though arouses to voice. When asked about pain, pt responds "my legs." RN at bedside, holding breakfast while pt is sleepy.   Objective:  Vital signs in last 24 hours: Vitals:   07/26/19 2329 07/27/19 0027 07/27/19 0213 07/27/19 0325  BP: (!) 75/53 102/71  (!) 91/49  Pulse: 77 71    Resp: _0 Temp: 98 F (36.7 C)   98.1 F (36.7 C)  TempSrc: Axillary   Axillary  SpO2: 94% 92%    Weight:   94.1 kg   Height:       Physical Exam Constitutional:      General: She is not in acute distress.    Appearance: She is ill-appearing (chronically).  Cardiovascular:     Rate and Rhythm: Normal rate and regular rhythm.     Heart sounds: Heart sounds are distant.  Pulmonary:     Effort: Respiratory distress (chronic) present. No tachypnea.     Breath sounds: Normal breath sounds. Decreased air movement and transmitted upper airway sounds present.  Abdominal:     General: Abdomen is flat. Bowel sounds are normal. There is no distension.     Palpations: Abdomen is soft.  Musculoskeletal:     Right lower leg: Pitting Edema present.     Left lower leg: Pitting Edema present.     Comments: Unchanged pitting edema in thighs. Unna boots in place.  Neurological:     Mental Status: She is lethargic.     Assessment/Plan: Assessment - Ms. Caughlin is a 68 year old F with significant PMH of end stage heart failure and COPD, CKD stage 3/4, diabetes, OSA, and memory disorder who presented to the ED due to volume overload and a fall at home. This appears to be an acute worsening of the chronic progression of her end-stage HF, COPD, and CKD.   Acute on Chronic Diastolic Heart Failure with Pulmonary Hypertension Pt presented to the ED with volume overload on exam consistent with exacerbation of right sided heart failure.  Heart Failure team consulted, appreciate recommendations Severe PAH likely WHO Groups I/II/III  - continue lasix  infusion at 58m/hr  - acetazolamide 2563mPO BID  - increase midodrine to 44m4mID for blood pressure support  - wrap legs with Unna boots  - continue sildenafil  - do not recommend further aggressive management (pressors or inotropes) - strict I/Os   - balance of +1L yesterday; daily wt up +2.5kg - supplemental oxygen (high flow) to keep O2 saturation >90% - holding home spironolactone in light of soft pressures Palliative care consulted, appreciate their input.  - Niece believes Shaliyah's wishes to be to not remain in this condition long-term  - plan to give the current plan some time and reassess - PT/OT ordered  - pt not appropriate for IP rehab  - leg pain likely secondary to edema  - PRN Tylenol available   - ordered diclofenac gel   - has been on enoxaparin still admission  - bilateral lower extremity doppler negative on 8/6   Altered Mental Status - waxing and waning throughout the day Concern for metabolic encephalopathy vs delirium - UA with large leukocytes, 11-20 WBCs, and rare bacteria  - culture showed multiple species  - treat with ceftriaxone IV 1g q24h on day 2 of 5 - could consider holding acetazolamide in the future due to associated CNS depression - CT head negative on 8/10 - ABG on 8/10 -  low pO2 at 56, with normal pH 7.363 and pCO2 46.3 - ammonia normal   Hypokalemia - K 3.4 this morning - replete with PO potassium chloride packets 23mq BID - Phos 4.9 (8/12) - Mg 2.3 (8/11)   Hypervolemic Hyponatremia - stable Related to patient's HF and resulting volume overload. Per chart review, sodium also low during earlier hospitalizations this year for decompensated HF and respiratory failure. - Na 128 this morning  - continue with fluid restriction - monitor daily BMP - can use tolvaptan if Na <125   Cardiorenal syndrome [name] Cr was 3.07 on admission (prior baseline Cr ~2.3). Rise in Cr attributed to volume overload vs worsening CKD vs cardiorenal  syndrome - overall improving - Cr stable ~2.7 - will continue current diuretic management as above - check daily BMPs - strict I/Os   Acute on Chronic Hypoxic Respiratory Failure due to COPD Pt noted to have centrolobular emphysema on CT, GOLD stage 0 due to PFTs not meeting criteria. Multiple hospitalizations due to COPD and respiratory failure. Is on 7-8L O2 at home.  - pt continues to be on high flow, on 8L today - continue home inhalers  - dulera 2 puffs BID  - albuterol 2.534mq4h PRN  - combivent 1-2 puffs q4h PRN - duonebs q4h PRN - home trilogy machine used as CPAP - supplemental O2 (high flow) to keep saturation >90%   Diabetes Pt takes glimepiride and metformin at home.  - SSI   Diet - mechanical soft diet Fluids - none DVT ppx - enoxaparin 3081mubQ daily CODE STATUS - DNR   Dispo: pt's response to diuresis minimal, mental status continues to wax and wane, will reach out to niece, [name], to discuss next step and move towards hospice care   JonLadona HornsD 07/27/2019, 6:27 AM Pager: [phone]

## 2019-07-27 NOTE — Consult Note (Addendum)
Normal Nurse wound consult note Reason for Consult: Consult requested for full thickness macerated area to left breast fold, related to moisture associated skin damage.  [place] team is working remotely today; reviewed nursing flowsheet and progress notes in the EMR.  [place] team will plan to assess the appearance of the location in person tomorrow.  Progress notes indicate that Interdry was utilized for severeral days without improvement to the affected area.   Dressing procedure/placement/frequency: Discontinue use of Interdry. Begin Aquacel to absorb drainage and provide antimicrobial benefits.  Foam dressing to protect from further injury. Julien Girt MSN, RN, [place], [place], [place]

## 2019-07-27 NOTE — Progress Notes (Signed)
Orthopedic Tech Progress Note Patient Details:  Elizabeth Thompson Mar 02, 1951 [id number]  Ortho Devices Type of Ortho Device: Haematologist Ortho Device/Splint Location: Bilateral unna boots Ortho Device/Splint Interventions: Application   Post Interventions Patient Tolerated: Well Instructions Provided: Care of device   [name] 07/27/2019, 3:04 PM

## 2019-07-27 NOTE — Consult Note (Signed)
[place] Nurse wound consult note Patient receiving care in [facility] 2C05. Reason for Consult: MASD under breast worsening.  I have asked the Internal Medicine resident to place a photo of the wound(s) in the EMR.  This request was made by Text page to the pager via [place].  Val Riles, RN, MSN, CWOCN, CNS-BC, pager [phone]

## 2019-07-28 ENCOUNTER — Ambulatory Visit: Payer: Medicare Other | Admitting: Cardiology

## 2019-07-28 DIAGNOSIS — E8779 Other fluid overload: Secondary | ICD-10-CM

## 2019-07-28 DIAGNOSIS — Z7189 Other specified counseling: Secondary | ICD-10-CM

## 2019-07-28 LAB — BASIC METABOLIC PANEL
Anion gap: 16 — ABNORMAL HIGH (ref 5–15)
BUN: 66 mg/dL — ABNORMAL HIGH (ref 8–23)
CO2: 26 mmol/L (ref 22–32)
Calcium: 9.7 mg/dL (ref 8.9–10.3)
Chloride: 89 mmol/L — ABNORMAL LOW (ref 98–111)
Creatinine, Ser: 2.65 mg/dL — ABNORMAL HIGH (ref 0.44–1.00)
GFR calc Af Amer: 21 mL/min — ABNORMAL LOW (ref >60)
GFR calc non Af Amer: 18 mL/min — ABNORMAL LOW (ref >60)
Glucose, Bld: 116 mg/dL — ABNORMAL HIGH (ref 70–99)
Potassium: 3.9 mmol/L (ref 3.5–5.1)
Sodium: 131 mmol/L — ABNORMAL LOW (ref 135–145)

## 2019-07-28 LAB — GLUCOSE, CAPILLARY
Glucose-Capillary: 124 mg/dL — ABNORMAL HIGH (ref 70–99)
Glucose-Capillary: 97 mg/dL (ref 70–99)
Glucose-Capillary: 173 mg/dL — ABNORMAL HIGH (ref 70–99)
Glucose-Capillary: 115 mg/dL — ABNORMAL HIGH (ref 70–99)

## 2019-07-28 MED ORDER — FUROSEMIDE 10 MG/ML IJ SOLN
120.0000 mg | Freq: Two times a day (BID) | INTRAVENOUS | Status: DC
  Administered 2019-07-28 (×2): 120 mg via INTRAVENOUS
  Filled 2019-07-28: qty 12
  Filled 2019-07-28 (×2): qty 10
  Filled 2019-07-28: qty 12

## 2019-07-28 MED ORDER — MIDODRINE HCL 5 MG PO TABS
10.0000 mg | ORAL_TABLET | Freq: Three times a day (TID) | ORAL | Status: DC
  Administered 2019-07-28 – 2019-08-01 (×13): 10 mg via ORAL
  Filled 2019-07-28 (×13): qty 2

## 2019-07-28 MED ORDER — POTASSIUM CHLORIDE CRYS ER 20 MEQ PO TBCR
30.0000 meq | EXTENDED_RELEASE_TABLET | Freq: Two times a day (BID) | ORAL | Status: AC
  Administered 2019-07-28: 22:00:00 30 meq via ORAL
  Filled 2019-07-28: qty 1

## 2019-07-28 NOTE — TOC Initial Note (Signed)
Transition of Care [facility]) - Initial/Assessment Note    Patient Details  Name: Elizabeth Thompson MRN: [id number] Date of Birth: 04/08/1951  [facility] CM/SW Contact:    Zenon Mayo, RN Phone Number: 07/28/2019, 3:16 PM  Clinical Narrative:                 From home with niece, she is on oxygen at home on couple of liters at baseline, she has a NIV at home.  CHF, massive vol overload, ckd 4, FTT, UTI, palilative and HF following. If patient improves will plan for home with hospice, will monitor 24 more hrs per palliative note.   Expected Discharge Plan: Home w Hospice Care Barriers to Discharge: No Barriers Identified   Patient Goals and CMS Choice Patient states their goals for this hospitalization and ongoing recovery are:: if improves home with hospice      Expected Discharge Plan and Services Expected Discharge Plan: Home w Hospice Care In-house Referral: Hospice / Palliative Care Discharge Planning Services: CM Consult   Living arrangements for the past 2 months: Single Family Home Expected Discharge Date: 07/24/19               DME Arranged: (NA)         HH Arranged: NA          Prior Living Arrangements/Services Living arrangements for the past 2 months: Single Family Home Lives with:: Relatives Patient language and need for interpreter reviewed:: Yes        Need for Family Participation in Patient Care: Yes (Comment) Care giver support system in place?: Yes (comment)   Criminal Activity/Legal Involvement Pertinent to Current Situation/Hospitalization: No - Comment as needed  Activities of Daily Living Home Assistive Devices/Equipment: Eyeglasses, Oxygen, Walker (specify type) ADL Screening (condition at time of admission) Patient's cognitive ability adequate to safely complete daily activities?: Yes Is the patient deaf or have difficulty hearing?: No Does the patient have difficulty seeing, even when wearing glasses/contacts?:  No Does the patient have difficulty concentrating, remembering, or making decisions?: Yes Patient able to express need for assistance with ADLs?: Yes Does the patient have difficulty dressing or bathing?: No Independently performs ADLs?: Yes (appropriate for developmental age) Does the patient have difficulty walking or climbing stairs?: Yes Weakness of Legs: Both Weakness of Arms/Hands: None  Permission Sought/Granted                  Emotional Assessment Appearance:: Appears stated age Attitude/Demeanor/Rapport: Unable to Assess Affect (typically observed): Unable to Assess Orientation: : (unable to assess) Alcohol / Substance Use: Not Applicable Psych Involvement: No (comment)  Admission diagnosis:  Peripheral edema [R60.9] Other hypervolemia [E87.79] CHF exacerbation ([place]) [I50.9] Patient Active Problem List   Diagnosis Date Noted  . Fall   . Palliative care encounter   . Peripheral edema   . CHF exacerbation ([place]) 07/19/2019  . Acute on chronic diastolic CHF (congestive heart failure) ([place])   . FTT (failure to thrive) in adult   . CKD (chronic kidney disease) stage 4, GFR 15-29 ml/min (HCC)   . Cor pulmonale, chronic ([place])   . Acute on chronic right-sided heart failure ([place])   . Hypervolemia   . Goals of care, counseling/discussion   . Palliative care by specialist   . OSA (obstructive sleep apnea)   . AKI (acute kidney injury) ([place]) 01/03/2019  . Acute respiratory failure ([place]) 01/03/2019  . Hypertension 07/02/2017  . Chronic respiratory failure with hypoxia and hypercapnia (  [place]) 07/02/2017  . COPD (chronic obstructive pulmonary disease) ([place]) 07/02/2017  . Diabetes mellitus without complication ([place]) 09/47/0962  . Pulmonary hypertension ([place]) 07/02/2017  . Bipolar disorder ([place]) 07/02/2017  . Hyperlipidemia 07/02/2017  . Acute hyponatremia 07/02/2017  . Acute metabolic encephalopathy 83/66/2947  . CKD (chronic kidney disease) stage 3, GFR 30-59 ml/min  (HCC) 07/02/2017  . Elevated troponin   . Cor pulmonale, acute ([place])   . Acute on chronic respiratory failure ([place]) 06/09/2017  . Chronic renal disease, stage III ([place]) 05/05/2017  . Chronic respiratory failure with hypoxia ([place]) 05/05/2017  . Renal cell carcinoma, left ([place]) 02/05/2017  . Dyspnea 12/28/2016  . Depression 09/04/2016  . Renal malignant tumor ([place]) 06/26/2016  . GERD (gastroesophageal reflux disease) 03/22/2016  . Insomnia 03/22/2016  . COPD GOLD 0  12/06/2015  . Essential hypertension 12/06/2015  . COPD with acute exacerbation ([place]) 11/20/2015  . Multiple lung nodules 11/20/2015  . Morbid obesity due to excess calories ([place]) 11/20/2015  . Hypersomnia with sleep apnea 11/20/2015  . Diabetes mellitus type 2, noninsulin dependent ([place]) 11/06/2015  . HLD (hyperlipidemia) 11/06/2015  . Chronic low back pain 05/09/2015  . Gait disorder 05/09/2015  . Memory disorder 09/05/2014  . Schizoaffective disorder ([place]) 08/18/2014   PCP:  Everardo Beals, NP Pharmacy:   [facility] [phone] [address], [place] [address] Phone: [phone] Fax: [phone]  [facility] [place] [place] - [address] [place] [address] Phone: [phone] Fax: [phone]     Social Determinants of Health (SDOH) Interventions    Readmission Risk Interventions Readmission Risk Prevention Plan 07/28/2019  Transportation Screening Complete  Medication Review ([place]) Complete  PCP or Specialist appointment within 3-5 days of discharge (No Data)  [place] or [place] Complete  SW Recovery Care/Counseling Consult Complete  Palliative Care Screening Complete  [place] Not Applicable  Some recent data might be hidden

## 2019-07-28 NOTE — Care Management Important Message (Signed)
Important Message  Patient Details  Name: Elizabeth Thompson MRN: [id number] Date of Birth: Dec 27, 1950   Medicare Important Message Given:  Yes     Memory Argue 07/28/2019, 1:32 PM

## 2019-07-28 NOTE — Progress Notes (Signed)
Occupational Therapy Treatment Patient Details Name: Elizabeth Thompson MRN: [id number] DOB: [birth year] Today's Date: 07/28/2019    History of present illness Pt is a 68 y/o female with PMH of end stage HF and COPD, CKD stage 3/4, DM, OSA, and memory disorder who presented to ED with volume overload and fall at home.    OT comments  Pt very lethargic and minimally reponsive/participatory today.  She required total A +2 for assist to EOB and back to supine.  She was able to sit EOB x ~15 mins with min - max A (variable).  She followed command x 1 only, and responded to questions <20% of the time.  Pt hypotensive throughout - see comments below.  She is unable to engage in ADL task and requires total A for all.  Recommendations changed to SNF, and goals downgraded.   If she is unable to show progress in the next week, may need to consider discharge from OT.   Follow Up Recommendations  SNF    Equipment Recommendations  None recommended by OT    Recommendations for Other Services      Precautions / Restrictions Precautions Precautions: Fall Restrictions Weight Bearing Restrictions: No       Mobility Bed Mobility Overal bed mobility: Needs Assistance Bed Mobility: Rolling;Sidelying to Sit Rolling: Total assist;+2 for physical assistance Sidelying to sit: Total assist;+2 for physical assistance       General bed mobility comments: Pt noted to be lying in urine.  Purewick not in place.  Assist to EOB with total assist of 2 persons with pt leaning to right and posterior.  Rolling for hygiene in bed with max to total  assist, at end of sessioon as all linens had to be changed as pt was wet.  unable to initate movement due to lethargy requiring +2 physical assist to complete motion due to weakness   Transfers                 General transfer comment: unable due to significant lethargy.    Balance Overall balance assessment: Needs assistance Sitting-balance support: Bilateral  upper extremity supported;Feet supported Sitting balance-Leahy Scale: Poor Sitting balance - Comments: Right lateral and posterior lean requiring min-total assist to maintain upright position.  Periods of min assist when pt had hands on OT lap but pt could not sustain upright sitting for more than a few seconds.  Sat EOB for 15 minutes.  Limited due to pt BP dropping significantly with nurse made aware.   Postural control: Posterior lean;Right lateral lean                                 ADL either performed or assessed with clinical judgement   ADL Overall ADL's : Needs assistance/impaired Eating/Feeding: Sitting;Bed level;Total assistance   Grooming: Wash/dry hands;Wash/dry face;Total assistance;Bed level;Sitting   Upper Body Bathing: Total assistance;Sitting;Bed level   Lower Body Bathing: Total assistance;Bed level   Upper Body Dressing : Total assistance;Bed level   Lower Body Dressing: Total assistance;Bed level Lower Body Dressing Details (indicate cue type and reason): unable to safely attempt      Toileting- Clothing Manipulation and Hygiene: Total assistance;Bed level Toileting - Clothing Manipulation Details (indicate cue type and reason): Pt incontinent of urine - assisted with peri care and bed linen change      Functional mobility during ADLs: Total assistance;+2 for physical assistance       Vision  Perception     Praxis      Cognition Arousal/Alertness: Lethargic Behavior During Therapy: Flat affect Overall Cognitive Status: Impaired/Different from baseline Area of Impairment: Attention                 Orientation Level: Disoriented to;Place;Time;Situation Current Attention Level: Focused       Awareness: Intellectual Problem Solving: Slow processing;Decreased initiation;Difficulty sequencing;Requires verbal cues;Requires tactile cues General Comments: Pt demonstrates focused attention.  She followed one step command x 1 to  pull herself forward, otherwise followed no commands.  She was able to tell me she like "rock" music, and "I like this" in regards to music selection.          Exercises     Shoulder Instructions       General Comments BP 79/49 initially with nurse stating it runs low.  Sitting EOB 71/61.  Then BP 91/59.  BP fluctuating and then pt with slight decr responsiveness and laid down BP to 68/54.  Nurse called MD.     Pertinent Vitals/ Pain       Pain Assessment: Faces Faces Pain Scale: Hurts whole lot Pain Location: (unable to specify) Pain Descriptors / Indicators: Aching Pain Intervention(s): Monitored during session;Limited activity within patient's tolerance;Repositioned  Home Living                                          Prior Functioning/Environment              Frequency  Min 2X/week        Progress Toward Goals  OT Goals(current goals can now be found in the care plan section)  Progress towards OT goals: Not progressing toward goals - comment;Goals drowngraded-see care plan  Acute Rehab OT Goals Patient Stated Goal: none stated, per niece to get her stronger to come home  ADL Goals Pt Will Perform Grooming: with mod assist;sitting Pt Will Perform Upper Body Bathing: with max assist;sitting Pt Will Transfer to Toilet: with max assist;squat pivot transfer;bedside commode  Plan Discharge plan needs to be updated    Co-evaluation    PT/OT/SLP Co-Evaluation/Treatment: Yes Reason for Co-Treatment: Complexity of the patient's impairments (multi-system involvement);Necessary to address cognition/behavior during functional activity;For patient/therapist safety PT goals addressed during session: Mobility/safety with mobility OT goals addressed during session: Strengthening/ROM      AM-PAC OT "6 Clicks" Daily Activity     Outcome Measure   Help from another person eating meals?: Total Help from another person taking care of personal grooming?:  Total Help from another person toileting, which includes using toliet, bedpan, or urinal?: Total Help from another person bathing (including washing, rinsing, drying)?: Total Help from another person to put on and taking off regular upper body clothing?: Total Help from another person to put on and taking off regular lower body clothing?: Total 6 Click Score: 6    End of Session Equipment Utilized During Treatment: Oxygen  OT Visit Diagnosis: Muscle weakness (generalized) (M62.81)   Activity Tolerance Treatment limited secondary to medical complications (Comment)   Patient Left in bed;with call bell/phone within reach;with nursing/sitter in room   Nurse Communication Mobility status        Time: 3559-7416 OT Time Calculation (min): 25 min  Charges: OT General Charges $OT Visit: 1 Visit OT Treatments $Therapeutic Activity: 8-22 mins  Lucille Passy, OTR/L [place] Pager [phone] Office [phone]  Lucille Passy M 07/28/2019, 11:18 AM

## 2019-07-28 NOTE — Progress Notes (Signed)
  Date: 07/28/2019  Patient name: Elizabeth Thompson  Medical record number: [id number]  Date of birth: December 20, 1950   I have seen and evaluated this patient and I have discussed the plan of care with the house staff. Please see Dr. Ronnald Ramp' note for complete details. I concur with her findings and plan.  Dr. Ronnald Ramp had a very good discussion with patient's caretaker, [name] as relayed in her note.  I would hold diuretics today given hypotension, encephalopathy and inability to give midodrine at this time.  Would hold diuretics for SBP < 100.  I am concerned that with lack of nutrition and exercise for the course of this hospitalization, that it would take quite significant work to improve her functional status going forward.  Agree with Cardiology team that hospice and comfort measures would be appropriate at this point.    Sid Falcon, MD 07/28/2019, 12:31 PM

## 2019-07-28 NOTE — Progress Notes (Signed)
Physical Therapy Treatment Patient Details Name: Elizabeth Thompson MRN: [id number] DOB: [birth year] Today's Date: 07/28/2019    History of Present Illness Pt is a 68 y/o female with PMH of end stage HF and COPD, CKD stage 3/4, DM, OSA, and memory disorder who presented to ED with volume overload and fall at home.     PT Comments    Pt admitted with above diagnosis. Pt currently with functional limitations due to the deficits listed below (see PT Problem List). Pt was very lethargic initially and was unable to assist with transition to EOB needing +2 total assist.  Once at EOB, fluctuating between min to total assist of 2 persons only sitting for brief seconds with min assist with use of hands propped on OT's legs in front of pt.  Pt lacks ability to demonstrate balance reactions.  O2 sats drop to 88% at times on 6L.  Pt with very low BP during and after treatment with initially 79/49 and down to 68/54 at end of session.  Pt nurse calling MD to make them aware.  Changed d/c plan to SNF as pt will not tolerate CIR.  Pt will benefit from skilled PT to increase their independence and safety with mobility to allow discharge to the venue listed below.     Follow Up Recommendations  SNF;Supervision/Assistance - 24 hour     Equipment Recommendations  None recommended by PT    Recommendations for Other Services       Precautions / Restrictions Precautions Precautions: Fall Restrictions Weight Bearing Restrictions: No    Mobility  Bed Mobility Overal bed mobility: Needs Assistance Bed Mobility: Rolling;Sidelying to Sit Rolling: Total assist;+2 for physical assistance Sidelying to sit: Total assist;+2 for physical assistance       General bed mobility comments: Pt noted to be lying in urine.  Purewick not in place.  Assist to EOB with total assist of 2 persons with pt leaning to right and posterior.  Rolling for hygiene in bed with max to total  assist, at end of sessioon as all linens had to  be changed as pt was wet.  unable to initate movement due to lethargy requiring +2 physical assist to complete motion due to weakness   Transfers                 General transfer comment: unable due to significant lethargy.  Ambulation/Gait                 Stairs             Wheelchair Mobility    Modified Rankin (Stroke Patients Only)       Balance Overall balance assessment: Needs assistance Sitting-balance support: Bilateral upper extremity supported;Feet supported Sitting balance-Leahy Scale: Poor Sitting balance - Comments: Right lateral and posterior lean requiring min-total assist to maintain upright position.  Periods of min assist when pt had hands on OT lap but pt could not sustain upright sitting for more than a few seconds.  Sat EOB for 15 minutes.  Limited due to pt BP dropping significantly with nurse made aware.   Postural control: Posterior lean;Right lateral lean                                  Cognition Arousal/Alertness: Lethargic Behavior During Therapy: Flat affect Overall Cognitive Status: Impaired/Different from baseline Area of Impairment: Orientation;Following commands;Awareness;Problem solving  Orientation Level: Disoriented to;Place;Time;Situation         Awareness: Intellectual Problem Solving: Slow processing;Decreased initiation;Difficulty sequencing;Requires verbal cues;Requires tactile cues General Comments: limited assessment due to lethargy, moaning throughout session      Exercises      General Comments General comments (skin integrity, edema, etc.): BP 79/49 initially with nurse stating it runs low.  Sitting EOB 71/61.  Then BP 91/59.  BP fluctuating and then pt with slight decr responsiveness and laid down BP to 68/54.  Nurse called MD.       Pertinent Vitals/Pain Pain Assessment: Faces Faces Pain Scale: Hurts whole lot Pain Location: (unable to specify) Pain Descriptors  / Indicators: Aching Pain Intervention(s): Limited activity within patient's tolerance;Monitored during session;Repositioned    Home Living                      Prior Function            PT Goals (current goals can now be found in the care plan section) Acute Rehab PT Goals Patient Stated Goal: none stated, per niece to get her stronger to come home  Progress towards PT goals: Not progressing toward goals - comment(Pt more lethargic and BP issues)    Frequency    Min 3X/week      PT Plan Discharge plan needs to be updated    Co-evaluation PT/OT/SLP Co-Evaluation/Treatment: Yes Reason for Co-Treatment: Complexity of the patient's impairments (multi-system involvement);For patient/therapist safety PT goals addressed during session: Mobility/safety with mobility        AM-PAC PT "6 Clicks" Mobility   Outcome Measure  Help needed turning from your back to your side while in a flat bed without using bedrails?: A Lot Help needed moving from lying on your back to sitting on the side of a flat bed without using bedrails?: Total Help needed moving to and from a bed to a chair (including a wheelchair)?: Total Help needed standing up from a chair using your arms (e.g., wheelchair or bedside chair)?: Total Help needed to walk in hospital room?: Total Help needed climbing 3-5 steps with a railing? : Total 6 Click Score: 7    End of Session Equipment Utilized During Treatment: Oxygen(6LO2) Activity Tolerance: Patient limited by fatigue(BP low) Patient left: with call bell/phone within reach;in bed;with bed alarm set Nurse Communication: Mobility status;Need for lift equipment PT Visit Diagnosis: Unsteadiness on feet (R26.81);Muscle weakness (generalized) (M62.81)     Time: 6789-3810 PT Time Calculation (min) (ACUTE ONLY): 23 min  Charges:  $Therapeutic Activity: 8-22 mins                     [place] Pager:  [phone]   Office:  [place] 07/28/2019, 10:27 AM

## 2019-07-28 NOTE — Progress Notes (Signed)
Patient refused CPAP tonight, stated she would rather use her nasal cannula.  Patient had issues using machine last night and was switched back to HFNC.  Sats currently are 94%, no distress noted.  Will continue to monitor.

## 2019-07-28 NOTE — Progress Notes (Signed)
Subjective: Pt seen at the bedside this AM. Continues to be somnolent, but stirs to voice. Paged for low BP this morning (79/49 and 68/54) and lethargy during PT session. Spoke to niece on the phone, states she's coming in this morning.  Objective:  Vital signs in last 24 hours: Vitals:   07/27/19 2216 07/27/19 2341 07/28/19 0353 07/28/19 0400  BP: 90/63 (!) 88/68 (!) 88/53   Pulse: 86 90 87   Resp: 19 14 (!) 22   Temp:  97.9 F (36.6 C) (!) 97.4 F (36.3 C)   TempSrc:  Oral Oral   SpO2: 95% 91% 91%   Weight:    92.6 kg  Height:       Physical Exam Vitals signs reviewed.  Constitutional:      Appearance: She is ill-appearing (chronically).  Cardiovascular:     Rate and Rhythm: Normal rate and regular rhythm.     Heart sounds: Heart sounds are distant. No murmur. No friction rub. No gallop.   Pulmonary:     Effort: Tachypnea and respiratory distress (chronic) present.     Breath sounds: Normal breath sounds. Decreased air movement present. No wheezing, rhonchi or rales.  Abdominal:     General: There is no distension.     Palpations: Abdomen is soft.     Tenderness: There is no abdominal tenderness.  Musculoskeletal:     Comments: Pitting edema of thighs unchanged from prior days. Unna boots in place.  Neurological:     General: No focal deficit present.     Mental Status: She is lethargic.     Comments: Oriented to place.     Assessment/Plan: Assessment - Elizabeth Thompson is a 68 year old F with significant PMH of end stage heart failure and COPD, CKD stage 3/4, diabetes, OSA, and memory disorder who presented to the ED due to volume overload and a fall at home. This appears to be a progressive worsening of the chronic progression of her end-stage HF, COPD, and CKD.  Pt has shown no signs of improvement over the course of treatment for volume overload and her mental status continues to decline becoming more somnolent throughout the day. Her prognosis is poor in the setting of  continued lethargy, multi-organ end stage disease, and general debilitated state.   Palliative care consulted, appreciate their input. - Niece fearful of making a decision too soon, continues to request watchful waiting over next 24hrs - appropriate for home with hospice vs residential hospice   Acute on Chronic Diastolic Heart Failure with Pulmonary Hypertension Pt presented with volume overload consistent with exacerbation of right sided heart failure.  Heart Failure team consulted, appreciate recommendations Severe PAH likely WHO Groups I/II/III  - lasix IV 120IV BID  - can switch to torsemide 40 BID if able to take PO  - discontinue acetazolamide  - increase midodrine to 64m TID for blood pressure support  - wrap legs with Unna boots  - discontinue sildenafil  - do not recommend further aggressive management - strict I/Os - balance of -3545myesterday; daily wt down -1.5kg  - leg pain likely secondary to edema  - PRN Tylenol available   - ordered diclofenac gel   - has been on enoxaparin still admission  - bilateral lower extremity doppler negative on 8/6   Altered Mental Status - unchanged - UA with large leukocytes, 11-20 WBCs, and rare bacteria  - culture showed multiple species  - on ceftriaxone IV 1g q24h on day 3 of  5 - CT head negative on 8/10 - ABG on 8/10 - low pO2 at 56, with normal pH 7.363 and pCO2 46.3 - ammonia normal   Hypokalemia - K 3.9 this morning - Phos 4.9 (8/12) - Mg 2.3 (8/11)   Hypervolemic Hyponatremia - improved due to pt's limited PO/free water intake Related to patient's HF and resulting volume overload. Per chart review, sodium also low during earlier hospitalizations this year for decompensated HF and respiratory failure. - Na 131 this morning  - monitor daily BMP   Cardiorenal syndrome [name] Cr was 3.07 on admission (prior baseline Cr ~2.3). Rise in Cr attributed to volume overload vs worsening CKD vs cardiorenal syndrome - Cr  sluggishly improving, 2.65 today - diuretic management as above - check daily BMPs - strict I/Os   Acute on Chronic Hypoxic Respiratory Failure due to COPD Pt noted to have centrolobular emphysema on CT, GOLD stage 0 due to PFTs not meeting criteria. Multiple hospitalizations due to COPD and respiratory failure. Is on 7-8L O2 at home.  - pt continues to be on high flow, on 8L today - continue home inhalers  - dulera 2 puffs BID  - albuterol 2.22m q4h PRN  - combivent 1-2 puffs q4h PRN - duonebs q4h PRN - home trilogy machine used as CPAP - supplemental O2 (high flow) to keep saturation >90%    Diet - mechanical soft diet Fluids - none DVT ppx - enoxaparin [id number] daily CODE STATUS - DNR   Dispo: pt's condition not improving at this time. Called pt's niece, PaJeannene Patellathis morning to discuss her prognosis. Explained that after a few days of continued treatment her mental status and overall condition is unchanged. Niece asked about electrolytes and renal function. Discussed that pt's prognosis and mental status is unlikely to change with any continued treatment. Elizabeth Thompson states she will be in later this morning and to stay the course at this time.  Elizabeth HornsMD 07/28/2019, 6:28 AM Pager: [phone]

## 2019-07-28 NOTE — Progress Notes (Signed)
Daily Progress Note   Patient Name: Elizabeth Thompson       Date: 07/28/2019 DOB: 08/24/1951  Age: 68 y.o. MRN#: [id number] Attending Physician: Sid Falcon, MD Primary Care Physician: Everardo Beals, NP Admit Date: 07/19/2019  Reason for Consultation/Follow-up: Establishing goals of care  Subjective: Patient remains lethargic. Will arouse to voice, but will not open eyes. States "pain in my legs, it's squeezing" in a singing tone. Also stating " I can't breathe" in a singing tone. Patient appears comfortable with no signs of respiratory distress. Oxygen saturations 97% on monitor and she is on 7L HFNC. Will not follow commands. No family at the bedside.   I spoke with her niece, [name] via phone. Updates provided. She verbalized understanding and appreciation. Discussed patient's continued lethargy, poor po intake, hypotension, and overall poor prognosis. She verbalized understanding however, she continues to feel that patient has not had enough time to show signs that she can improve. She shares with me her understanding of her lab results and weight result from today.She feels now that her sodium is somewhat improving and patient continues to be diuresed that she may show signs of more alertness over time.   She verbalized awareness of her poor nutritional state and lethargy referencing patient has never been a morning person and "we need to see how she does later in the day". I shared patient's state of lethargy and minimal to no signs of improvement has been consistent throughout the day over the past 2-3 days despite stimuli and attempts from PT to work with patient. [name] verbalizes understanding and states she is not comfortable making decisions regarding hospice or comfort care until she  "feels right and knows it is the only option". She is planning to come up in the next hour and stay with patient until after dinner, expressing she is hopeful she will awaken to her presence and will take in some food at meal times.   I encouraged her to spend time with her today, consider her current quality of life with comparison to the aunt she has known over the years and what would be most important to her at this time. I encouraged her to consider patient's wishes and how she would respond if she could speak with her during this time and provide guidance on how to  proceed with care knowing her overall functional and health conditions. 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 Support given and therapeutic listening provided. I acknowledge awareness that patient is blessed to have her as an advocate and care takerknowing that as long as she considered the patient and her wellbeing she would make the best and right decision for her and not allow her to suffer as she has requested in the past. [name] verbalized understanding and appreciation.   She requested to allow her to spend time with her today and see how she does before further consideration of a more comfort approach. She would like to continue with current plan of care.   Length of Stay: 9  Current Medications: Scheduled Meds:   ARIPiprazole  20 mg Oral QHS   aspirin EC  81 mg Oral Daily   brimonidine  1 drop Both Eyes BID   diclofenac sodium  2 g Topical QID   donepezil  5 mg Oral QHS   dorzolamide-timolol  1 drop Both Eyes BID   enoxaparin (LOVENOX) injection  30 mg Subcutaneous Q24H   ferrous sulfate  325 mg Oral Q breakfast   Gerhardt's butt cream   Topical Daily   insulin aspart  0-9 Units Subcutaneous TID WC   lamoTRIgine  100 mg Oral  QPM   latanoprost  1 drop Both Eyes QHS   mouth rinse  15 mL Mouth Rinse BID   midodrine  10 mg Oral TID WC   mometasone-formoterol  2 puff Inhalation BID   potassium chloride  30 mEq Oral BID   sodium chloride flush  3 mL Intravenous Q12H    Continuous Infusions:  cefTRIAXone (ROCEPHIN)  IV 1 g (07/28/19 0931)   furosemide 120 mg (07/28/19 0935)    PRN Meds: acetaminophen **OR** acetaminophen, albuterol, guaiFENesin, ipratropium-albuterol, polyethylene glycol, sodium chloride  Physical Exam      General: Lethargic, will arouse to voice stimuli, will not open eyes or follow commands, singing tone but will not answer asked questions appropriately. NAD Lungs: diminished bases Cardio: RRR, hypotensive Neuro: lethargic, will not follow commands  Vital Signs: BP (!) 79/64 (BP Location: Left Arm)    Pulse 86    Temp 97.8 F (36.6 C) (Axillary)    Resp 17    Ht _0  (1.6 m)    Wt 92.6 kg    SpO2 94%    BMI 36.16 kg/m  SpO2: SpO2: 94 % O2 Device: O2 Device: Nasal Cannula O2 Flow Rate: O2 Flow Rate (L/min): 7 L/min  Intake/output summary:   Intake/Output Summary (Last 24 hours) at 07/28/2019 1045 Last data filed at 07/28/2019 0500 Gross per 24 hour  Intake 546.02 ml  Output 900 ml  Net -353.98 ml   LBM: Last BM Date: 07/27/19 Baseline Weight: Weight: 83.5 kg Most recent weight: Weight: 92.6 kg       Palliative Assessment/Data:PPS 20%      Patient Active Problem List   Diagnosis Date Noted   Fall    Palliative care encounter    Peripheral edema    CHF exacerbation ([place]) 07/19/2019   Acute on chronic diastolic CHF (congestive heart failure) (HCC)    FTT (failure to thrive) in adult    CKD (chronic kidney disease) stage 4, GFR 15-29 ml/min (HCC)  Cor pulmonale, chronic (HCC)    Acute on chronic right-sided heart failure (HCC)    Hypervolemia    Goals of care, counseling/discussion    Palliative care by specialist    OSA (obstructive sleep  apnea)    AKI (acute kidney injury) ([place]) 01/03/2019   Acute respiratory failure ([place]) 01/03/2019   Hypertension 07/02/2017   Chronic respiratory failure with hypoxia and hypercapnia ([place]) 07/02/2017   COPD (chronic obstructive pulmonary disease) ([place]) 07/02/2017   Diabetes mellitus without complication ([place]) 16/09/9603   Pulmonary hypertension ([place]) 07/02/2017   Bipolar disorder ([place]) 07/02/2017   Hyperlipidemia 07/02/2017   Acute hyponatremia 54/08/8118   Acute metabolic encephalopathy 14/78/2956   CKD (chronic kidney disease) stage 3, GFR 30-59 ml/min (HCC) 07/02/2017   Elevated troponin    Cor pulmonale, acute (HCC)    Acute on chronic respiratory failure ([place]) 06/09/2017   Chronic renal disease, stage III ([place]) 05/05/2017   Chronic respiratory failure with hypoxia ([place]) 05/05/2017   Renal cell carcinoma, left (HCC) 02/05/2017   Dyspnea 12/28/2016   Depression 09/04/2016   Renal malignant tumor ([place]) 06/26/2016   GERD (gastroesophageal reflux disease) 03/22/2016   Insomnia 03/22/2016   COPD GOLD 0  12/06/2015   Essential hypertension 12/06/2015   COPD with acute exacerbation ([place]) 11/20/2015   Multiple lung nodules 11/20/2015   Morbid obesity due to excess calories ([place]) 11/20/2015   Hypersomnia with sleep apnea 11/20/2015   Diabetes mellitus type 2, noninsulin dependent ([place]) 11/06/2015   HLD (hyperlipidemia) 11/06/2015   Chronic low back pain 05/09/2015   Gait disorder 05/09/2015   Memory disorder 09/05/2014   Schizoaffective disorder ([place]) 08/18/2014    Palliative Care Assessment & Plan   Patient Profile: 68 y.o.femalewith past medical history of severe pulmonary hypertension with cor pulmonale, CKD stg 4, heart failurewho was admitted on 8/5/2020after a fall with SOB.  She is being treated for acute on chronic heart failure and hypoxic respiratory failure. I received a call today from her bedside RN with a concern that the  patient is not waking and becoming alert. Family requested a Palliative Medicine consult.  Assessment: Lethargic, hyponatremic 131, poor po intake  Recommendations/Plan:  DNR-as confirmed by [name] (niece)  [name] (POA) aware of patient wishes but fearful of making decision to soon. Continues to request watchful waiting over next 24 hrs.    Has not shown much signs of improvement in lethargy or overall functional state over past 2 days. If and when family is accepting patient would be appropriate for home with hospice versus residential hospice.   PMT will continue to support and follow for continued support and discussions.   Goals of Care and Additional Recommendations:  Limitations on Scope of Treatment: Full Scope Treatment   Code Status: DNR/Advanced Directives   Prognosis:  Guarded to Poor in the setting of continued lethargy, hyponatremia, severe pulm HTN, ES-COPD and diastolic HF she is in quite a weakened state. She is at high risk for acute decompensation, decline and death. Showing minimal to no signs of improvement despite all medical interventions.   Discharge Planning:  To Be Determined  Care plan was discussed with patient's niece ([name]/POA) and bedside RN.   Thank you for allowing the Palliative Medicine Team to assist in the care of this patient.  Total Time: 45 min.   Greater than 50%  of this time was spent counseling and coordinating care related to the above assessment and plan.  Alda Lea, AGPCNP-BC Palliative Medicine Team   Please  contact Palliative Medicine Team phone at [phone] for questions and concerns.

## 2019-07-28 NOTE — Progress Notes (Signed)
Advanced Heart Failure Rounding Note   Subjective:    Remains lethargic. Will arouse to voice but non-communicative. Moans to noxious stimuli.   Remains on lasix gtt at 20. Weight down 3 pounds  Objective:   Weight Range:  Vital Signs:   Temp:  [97.4 F (36.3 C)-98 F (36.7 C)] 97.5 F (36.4 C) (08/14 0751) Pulse Rate:  [80-90] 81 (08/14 0751) Resp:  [11-22] 15 (08/14 0751) BP: (74-141)/(37-81) 109/75 (08/14 0751) SpO2:  [91 %-95 %] 92 % (08/14 0751) Weight:  [92.6 kg] 92.6 kg (08/14 0400) Last BM Date: 07/27/19  Weight change: Filed Weights   07/26/19 0500 07/27/19 0213 07/28/19 0400  Weight: 91.6 kg 94.1 kg 92.6 kg    Intake/Output:   Intake/Output Summary (Last 24 hours) at 07/28/2019 0833 Last data filed at 07/28/2019 0500 Gross per 24 hour  Intake 546.02 ml  Output 900 ml  Net -353.98 ml     Physical Exam: General: Lethargic. Will arouse to voice barely but no other response HEENT: normal Neck: supple.JVP up . Carotids 2+ bilat; no bruits. No lymphadenopathy or thryomegaly appreciated. Cor: PMI nondisplaced. Regular rate & rhythm. No rubs, gallops or murmurs. Lungs: clear Abdomen: soft, nontender, nondistended. No hepatosplenomegaly. No bruits or masses. Good bowel sounds. Extremities: no cyanosis, clubbing, rash, 1+ edema Neuro: lethargic responds to pain      Telemetry: NSR 80s Personally reviewed   Labs: Basic Metabolic Panel: Recent Labs  Lab 07/25/19 0246 07/25/19 0830 07/26/19 0427 07/26/19 1848 07/27/19 0328 07/28/19 0242  NA 128*  --  128* 127* 128* 131*  K 3.0*  --  2.8* 3.5 3.4* 3.9  CL 85*  --  87* 85* 86* 89*  CO2 25  --  _0 GLUCOSE 110*  --  134* 172* 126* 116*  BUN 66*  --  68* 67* 65* 66*  CREATININE 2.84*  --  2.74* 2.66* 2.73* 2.65*  CALCIUM 9.3  --  9.5 9.4 9.4 9.7  MG  --  2.3  --   --   --   --   PHOS  --   --  4.9*  --   --   --     Liver Function Tests: No results for input(s): AST, ALT, ALKPHOS,  BILITOT, PROT, ALBUMIN in the last 168 hours. No results for input(s): LIPASE, AMYLASE in the last 168 hours. Recent Labs  Lab 07/23/19 1442  AMMONIA 26    CBC: Recent Labs  Lab 07/23/19 1442  WBC 4.5  HGB 13.8  HCT 43.6  MCV 80.6  PLT 371    Cardiac Enzymes: No results for input(s): CKTOTAL, CKMB, CKMBINDEX, TROPONINI in the last 168 hours.  BNP: BNP (last 3 results) Recent Labs    02/26/19 0632 03/27/19 1531 07/19/19 0657  BNP 547.8* 593.2* 1,147.6*    ProBNP (last 3 results) No results for input(s): PROBNP in the last 8760 hours.    Other results:  Imaging: No results found.   Medications:     Scheduled Medications: . acetaZOLAMIDE  250 mg Oral BID  . ARIPiprazole  20 mg Oral QHS  . aspirin EC  81 mg Oral Daily  . brimonidine  1 drop Both Eyes BID  . diclofenac sodium  2 g Topical QID  . donepezil  5 mg Oral QHS  . dorzolamide-timolol  1 drop Both Eyes BID  . enoxaparin (LOVENOX) injection  30 mg Subcutaneous Q24H  . ferrous sulfate  325 mg Oral Q  breakfast  . Gerhardt's butt cream   Topical Daily  . insulin aspart  0-9 Units Subcutaneous TID WC  . lamoTRIgine  100 mg Oral QPM  . latanoprost  1 drop Both Eyes QHS  . mouth rinse  15 mL Mouth Rinse BID  . midodrine  5 mg Oral TID WC  . mometasone-formoterol  2 puff Inhalation BID  . sildenafil  20 mg Oral TID  . sodium chloride flush  3 mL Intravenous Q12H    Infusions: . cefTRIAXone (ROCEPHIN)  IV 1 g (07/27/19 0930)  . furosemide (LASIX) infusion 20 mg/hr (07/28/19 0654)    PRN Medications: acetaminophen **OR** acetaminophen, albuterol, guaiFENesin, ipratropium-albuterol, polyethylene glycol, sodium chloride   Assessment/Plan:   1. Acute on chronic diastolic HF R>>L with massive volume overload - volume status stable now on lasix gtt and actezolamide - weight today 201 pounds baseline weight seems to be ~195 but she is close  - as she is not taking po well will switch to lasix 120 IV  bid. Stop diamox. Can switch to torsemide 40 bid if/when taking po - BP low - increase midodrine to 10 tid  Main issue here is no longer volume overload but overall lethargy and poor prognosis. Would strongly recommend hospice care and discontinuation of all aggressive therapies as it is clear that she is not going to improve with volume removal.   2. Severe PAH with a/c cor pulmonale - likely combination of WHO Groups I/II/III. Suspect OHS playing significant role.  - on sildenafil will stopp with low BPS  3. Acute on CKD 4 - baseline creatinine 2.5-3.0. Peaked at 3.1 - creatinine stable today 2.65   4. Acute on chronic hypoxic respiratory failure - multifactorial on 8L at home - remains on HFNC   5. Hyponatremia - improved with inability to take free water  8. UTI  - UCx mixed species. - on ceftriaxone  9. DNR/DNI - Palliative Team involved. Appreciate their input.She is clearly end-stage. Would encourage a move toward comfort care/hospice  HF team will s/o please call with questions    Length of Stay: 9   Glori Bickers MD 07/28/2019, 8:33 AM  Advanced Heart Failure Team Pager [phone] (M-F; [place])  Please contact [facility] for night-coverage after hours (4p -7a ) and weekends on [email or web address]

## 2019-07-28 NOTE — Consult Note (Addendum)
[facility] Nurse wound follow up Refer to consult note on 8/13.  Assessed left breast wound this AM in person; full thickness wound in skin fold, 1X6X.2cm, red and moist, no odor, small amt tan drainage. Dressing procedure/placement/frequency: Continue present plan of care as previously ordered for bedside nurse to perform;  Aquacel to absorb drainage and provide antimicrobial benefits. Foam dressing to protect from further injury. Please re-consult if further assistance is needed.  Thank-you,  Julien Girt MSN, [place], [place], [place], [place]

## 2019-07-29 LAB — GLUCOSE, CAPILLARY
Glucose-Capillary: 132 mg/dL — ABNORMAL HIGH (ref 70–99)
Glucose-Capillary: 203 mg/dL — ABNORMAL HIGH (ref 70–99)
Glucose-Capillary: 117 mg/dL — ABNORMAL HIGH (ref 70–99)
Glucose-Capillary: 170 mg/dL — ABNORMAL HIGH (ref 70–99)

## 2019-07-29 LAB — BASIC METABOLIC PANEL
Anion gap: 15 (ref 5–15)
BUN: 68 mg/dL — ABNORMAL HIGH (ref 8–23)
CO2: 27 mmol/L (ref 22–32)
Calcium: 9.7 mg/dL (ref 8.9–10.3)
Chloride: 89 mmol/L — ABNORMAL LOW (ref 98–111)
Creatinine, Ser: 2.66 mg/dL — ABNORMAL HIGH (ref 0.44–1.00)
GFR calc Af Amer: 21 mL/min — ABNORMAL LOW (ref >60)
GFR calc non Af Amer: 18 mL/min — ABNORMAL LOW (ref >60)
Glucose, Bld: 120 mg/dL — ABNORMAL HIGH (ref 70–99)
Potassium: 3.3 mmol/L — ABNORMAL LOW (ref 3.5–5.1)
Sodium: 131 mmol/L — ABNORMAL LOW (ref 135–145)

## 2019-07-29 MED ORDER — POTASSIUM CHLORIDE CRYS ER 10 MEQ PO TBCR
30.0000 meq | EXTENDED_RELEASE_TABLET | Freq: Once | ORAL | Status: AC
  Administered 2019-07-29: 06:00:00 30 meq via ORAL
  Filled 2019-07-29: qty 3

## 2019-07-29 MED ORDER — FUROSEMIDE 10 MG/ML IJ SOLN: 80.0000 mg | Freq: Two times a day (BID) | INTRAMUSCULAR | Status: DC

## 2019-07-29 MED ORDER — TORSEMIDE 20 MG PO TABS: 40.0000 mg | ORAL_TABLET | Freq: Two times a day (BID) | ORAL | Status: DC

## 2019-07-29 MED ORDER — POTASSIUM CHLORIDE CRYS ER 20 MEQ PO TBCR
40.0000 meq | EXTENDED_RELEASE_TABLET | Freq: Once | ORAL | Status: AC
  Administered 2019-07-29: 17:00:00 40 meq via ORAL
  Filled 2019-07-29: qty 2

## 2019-07-29 MED ORDER — FUROSEMIDE 10 MG/ML IJ SOLN: 100.0000 mg | Freq: Two times a day (BID) | INTRAVENOUS | Status: DC

## 2019-07-29 MED ORDER — FENTANYL CITRATE (PF) 100 MCG/2ML IJ SOLN
12.5000 ug | INTRAMUSCULAR | Status: DC | PRN
  Administered 2019-07-29 – 2019-07-30 (×2): 25 ug via INTRAVENOUS
  Filled 2019-07-29 (×2): qty 2

## 2019-07-29 MED ORDER — TORSEMIDE 20 MG PO TABS
40.0000 mg | ORAL_TABLET | Freq: Two times a day (BID) | ORAL | Status: DC
  Administered 2019-07-29 – 2019-08-01 (×7): 40 mg via ORAL
  Filled 2019-07-29 (×7): qty 2

## 2019-07-29 NOTE — Progress Notes (Signed)
Daily Progress Note   Patient Name: Elizabeth Thompson       Date: 07/29/2019 DOB: [birth year]  Age: 68 y.o. MRN#: [id number] Attending Physician: Sid Falcon, MD Primary Care Physician: Elizabeth Beals, NP Admit Date: 07/19/2019  Reason for Consultation/Follow-up: Establishing goals of care  Subjective: Patient much more awake and alert today compared to yesterday. Complains of pain in both legs. Denies shortness of breath. She is asking for something to drink. Niece, Elizabeth Thompson is at the bedside verbalizing patient was able to take morning medications today.   Elizabeth Thompson states she has been thinking about patient's condition and her decline. She is tearful in expressing she does not feel patient would be best cared for in the home given her condition and being the only caretaker available. She is concerned that patient will require a level of care she cannot provide and also concerned about her being unable to effectively manage symptoms and patient suffering. Support provided. Elizabeth Thompson asking to further discuss options for patient. I educated her at length on the difference in home with hospice versus residential hospice for EOL support.   Elizabeth Thompson shares she was hopeful patient would show signs of improvement and she is somewhat conflicted seeing her more alert and interactive today compared to her continued lethargy over the past several days. I discussed with her patient's overall condition and co-morbidities. We discussed trajectory of her illness. Elizabeth Thompson verbalized understanding understanding.   We discussed comfort versus continued aggressive care. Elizabeth Thompson acknowledges patient's decline, continued struggle with her health, and wishes for her to be comfortable and to not suffer. Although patient has  been sleep on and off during discussions she awakens stating "I am tired and I am tired of my legs hurting. They hurting right now!" I verbalized understanding of patient's statement asking her what she meant when she said she was tired. [name] stated "tired of everything!" Elizabeth Thompson acknowledge her response. Patient states she wants to just go home. Elizabeth Thompson explains to her concerns of being able to provide the best care and management for her in the home, and feelings of it maybe best for her to go to [facility] for support and ability to keep her comfortable managing symptoms. Patient verbalized her acceptance stating "as long as I am comfortable and taken care of." Elizabeth Thompson states wishes to  proceed with consideration for patient to transfer to [facility] next week, expressing if she does improve or does not pass away there she would then want to bring her home if it becomes appropriate. We discuss their are some occassions patient's are able to graduate from a hospice facility however, in [name]'s case she would most likely pass away there. Elizabeth Thompson and patient verbalized understanding.   I discussed transitioning [name] care to a more comfort focused while hospitalized with a priority on making sure she is not experiencing pain or shortness of breath. Elizabeth Thompson verbalized understanding however, she wishes to continue with current medications while hospitalized. We reviewed medications and minimizing nonessentials. She agreed on discontinuing lovenox and iron supplements, and requesting additional pain medications. I discussed the use of medications to assist with comfort and symptom management. I also discussed discontinuation of orders not comfort focused such as PT/OT involvement. Elizabeth Thompson express agreement but also states if patient request to get out of bed she would want staff to assist if she is able.   I again reviewed goals and care of hospice/[facility]. Elizabeth Thompson verbalized understanding and  confirming wishes to proceed with referral. I explained to her the referral process.    Length of Stay: 10  Current Medications: Scheduled Meds:  . ARIPiprazole  20 mg Oral QHS  . aspirin EC  81 mg Oral Daily  . brimonidine  1 drop Both Eyes BID  . diclofenac sodium  2 g Topical QID  . donepezil  5 mg Oral QHS  . dorzolamide-timolol  1 drop Both Eyes BID  . enoxaparin (LOVENOX) injection  30 mg Subcutaneous Q24H  . ferrous sulfate  325 mg Oral Q breakfast  . Gerhardt's butt cream   Topical Daily  . insulin aspart  0-9 Units Subcutaneous TID WC  . lamoTRIgine  100 mg Oral QPM  . latanoprost  1 drop Both Eyes QHS  . mouth rinse  15 mL Mouth Rinse BID  . midodrine  10 mg Oral TID WC  . mometasone-formoterol  2 puff Inhalation BID  . potassium chloride  40 mEq Oral Once  . sodium chloride flush  3 mL Intravenous Q12H  . torsemide  40 mg Oral BID    Continuous Infusions: . cefTRIAXone (ROCEPHIN)  IV 1 g (07/29/19 0957)  . furosemide      PRN Meds: acetaminophen **OR** acetaminophen, albuterol, guaiFENesin, ipratropium-albuterol, polyethylene glycol, sodium chloride  Physical Exam      General: more awake and alert today.  NAD Lungs: diminished bases Cardio: RRR, hypotensive Neuro:weakness, follows commands, alert to self, location, & family  Vital Signs: BP (!) 119/54   Pulse 70   Temp 98.1 F (36.7 C) (Axillary)   Resp 18   Ht _0  (1.6 m)   Wt 90.5 kg   SpO2 93%   BMI 35.34 kg/m  SpO2: SpO2: 93 % O2 Device: O2 Device: High Flow Nasal Cannula O2 Flow Rate: O2 Flow Rate (L/min): 9 L/min  Intake/output summary:   Intake/Output Summary (Last 24 hours) at 07/29/2019 1228 Last data filed at 07/29/2019 1020 Gross per 24 hour  Intake 567.39 ml  Output 550 ml  Net 17.39 ml   LBM: Last BM Date: 07/29/19 Baseline Weight: Weight: 83.5 kg Most recent weight: Weight: 90.5 kg       Palliative Assessment/Data:PPS 20%      Patient Active Problem List   Diagnosis  Date Noted  . Fall   . Palliative care encounter   .  Peripheral edema   . CHF exacerbation ([place]) 07/19/2019  . Acute on chronic diastolic CHF (congestive heart failure) ([place])   . FTT (failure to thrive) in adult   . CKD (chronic kidney disease) stage 4, GFR 15-29 ml/min (HCC)   . Cor pulmonale, chronic ([place])   . Acute on chronic right-sided heart failure ([place])   . Hypervolemia   . Goals of care, counseling/discussion   . Palliative care by specialist   . OSA (obstructive sleep apnea)   . AKI (acute kidney injury) ([place]) 01/03/2019  . Acute respiratory failure ([place]) 01/03/2019  . Hypertension 07/02/2017  . Chronic respiratory failure with hypoxia and hypercapnia ([place]) 07/02/2017  . COPD (chronic obstructive pulmonary disease) ([place]) 07/02/2017  . Diabetes mellitus without complication ([place]) 97/67/3419  . Pulmonary hypertension ([place]) 07/02/2017  . Bipolar disorder ([place]) 07/02/2017  . Hyperlipidemia 07/02/2017  . Acute hyponatremia 07/02/2017  . Acute metabolic encephalopathy 37/90/2409  . CKD (chronic kidney disease) stage 3, GFR 30-59 ml/min (HCC) 07/02/2017  . Elevated troponin   . Cor pulmonale, acute ([place])   . Acute on chronic respiratory failure ([place]) 06/09/2017  . Chronic renal disease, stage III ([place]) 05/05/2017  . Chronic respiratory failure with hypoxia ([place]) 05/05/2017  . Renal cell carcinoma, left ([place]) 02/05/2017  . Dyspnea 12/28/2016  . Depression 09/04/2016  . Renal malignant tumor ([place]) 06/26/2016  . GERD (gastroesophageal reflux disease) 03/22/2016  . Insomnia 03/22/2016  . COPD GOLD 0  12/06/2015  . Essential hypertension 12/06/2015  . COPD with acute exacerbation ([employer]) 11/20/2015  . Multiple lung nodules 11/20/2015  . Morbid obesity due to excess calories ([place]) 11/20/2015  . Hypersomnia with sleep apnea 11/20/2015  . Diabetes mellitus type 2, noninsulin dependent ([place]) 11/06/2015  . HLD (hyperlipidemia) 11/06/2015  . Chronic low back pain 05/09/2015  . Gait  disorder 05/09/2015  . Memory disorder 09/05/2014  . Schizoaffective disorder ([place]) 08/18/2014    Palliative Care Assessment & Plan   Patient Profile: 68 y.o.femalewith past medical history of severe pulmonary hypertension with cor pulmonale, CKD stg 4, heart failurewho was admitted on 8/5/2020after a fall with SOB.  She is being treated for acute on chronic heart failure and hypoxic respiratory failure. I received a call today from her bedside RN with a concern that the patient is not waking and becoming alert. Family requested a Palliative Medicine consult.  Assessment: Lethargic, hyponatremic 131, poor po intake  Recommendations/Plan:  DNR-as confirmed by Elizabeth Thompson (niece)  Elizabeth Thompson (POA) requesting [facility] referral for EOL care. Referral placed.   Wishes to continue with current plan of care, minimize medications (will d/c lovenox and iron supplement with family's approval), and medical interventions (will d/c PT/OT). She wishes to have lab work done tomorrow to see what sodium levels are, despite awareness of no interventions.   Fentanyl PRN for pain/shortness of breath  Patient complaining of bilateral leg pain. RN may remove lower extremity dressings for comfort and pain relief.    PMT will continue to support and follow for continued support and discussions.   Goals of Care and Additional Recommendations:  Limitations on Scope of Treatment: Minimize Medications, No Diagnostics and continue current treatment with no escalation of care.    Code Status: DNR/Advanced Directives   Prognosis:  Guarded to Poor in the setting of continued lethargy, hyponatremia, severe pulm HTN, ES-COPD and diastolic HF she is in quite a weakened state. She is at high risk for acute decompensation, decline and death. Showing minimal to no signs of  improvement despite all medical interventions.   Discharge Planning:  Hospice facility  Care plan was discussed with patient's niece (Elizabeth Thompson/POA)  and bedside RN.   Thank you for allowing the Palliative Medicine Team to assist in the care of this patient.  Total Time: 55 min.   Greater than 50%  of this time was spent counseling and coordinating care related to the above assessment and plan.  Alda Lea, AGPCNP-BC Palliative Medicine Team   Please contact Palliative Medicine Team phone at [phone] for questions and concerns.

## 2019-07-29 NOTE — Progress Notes (Signed)
  Date: 07/29/2019  Patient name: Elizabeth Thompson  Medical record number: [id number]  Date of birth: October 03, 1951   I have seen and evaluated this patient and I have discussed the plan of care with the house staff. Please see Dr. Maudie Flakes note for complete details. I concur with his findings and plan.  Patient is minimally interactive, but based on review of chart, she is more alert in the afternoons.  She does not seem to be getting adequate medication with midodrine or nutrition in this state.  I am concerned that she will continue to weaken and become unable to eat moving forward. Niece (primary caregiver) would like to continue watchful waiting to see if she will get better.  We will minimize invasive procedures in the interim.     Sid Falcon, MD 07/29/2019, 10:39 AM

## 2019-07-29 NOTE — Progress Notes (Signed)
MEWS Guidelines - (patients age 69 and over)  Red - At High Risk for Deterioration Yellow - At risk for Deterioration  1. Go to room and assess patient 2. Validate data. Is this patient's baseline? If data confirmed: 3. Is this an acute change? 4. Administer prn meds/treatments as ordered. 5. Note Sepsis score 6. Review goals of care 7. Sports coach, RRT nurse and Provider. 8. Ask Provider to come to bedside.  9. Document patient condition/interventions/response. 10. Increase frequency of vital signs and focused assessments to at least q15 minutes x 4, then q30 minutes x2. - If stable, then q1h x3, then q4h x3 and then q8h or dept. routine. - If unstable, contact Provider & RRT nurse. Prepare for possible transfer. 11. Add entry in progress notes using the smart phrase ".MEWS". 1. Go to room and assess patient - Completed 2. Validate data. Is this patient's baseline? Yes If data confirmed: 3. Is this an acute change? No  4. Administer prn meds/treatments as ordered? N/A 5. Note Sepsis score 6. Review goals of care 7. Sports coach and Provider - Completed 8. Call RRT nurse as needed. 9. Document patient condition/interventions/response. 10. Increase frequency of vital signs and focused assessments to at least q2h x2. - If stable, then q4h x2 and then q8h or dept. routine. - If unstable, contact Provider & RRT nurse. Prepare for possible transfer. 11. Add entry in progress notes using the smart phrase ".MEWS".   Green - Likely stable Lavender - Comfort Care Only  1. Continue routine/ordered monitoring.  2. Review goals of care. 1. Continue routine/ordered monitoring. 2. Review goals of care.    MEWS Score: 2  Patient's BP continue to remain soft and patient's LOC remains altered. This is not an acute change.  Patient remains under frequent monitoring and vitals will remain being taken according to MEWS protocol.

## 2019-07-29 NOTE — Progress Notes (Addendum)
Subjective: Pt somnolent this morning, minimally interactive.    Objective:  Vital signs in last 24 hours: Vitals:   07/28/19 2130 07/29/19 0021 07/29/19 0352 07/29/19 0850  BP: 105/65 (!) 85/59 (!) 88/59 (!) 79/59  Pulse: 90 82 86 71  Resp: _0 Temp: (!) 97.5 F (36.4 C) 97.9 F (36.6 C) 97.9 F (36.6 C) 98 F (36.7 C)  TempSrc: Oral Oral Oral Oral  SpO2: 97% 97% 94% 94%  Weight:   90.5 kg   Height:        Cardiac:  normal rate and rhythm, clear s1 and s2, LE edema 1+ Pulmonary: anterior lung fields clear pt too lethargic to sit up Abdominal: non distended abdomen, soft and nontender Psych: somnolent     Assessment/Plan: Assessment - Ms. Bonilla is a 67 year old F with significant PMH of end stage heart failure and COPD, CKD stage 3/4, diabetes, OSA, and memory disorder who presented to the ED due to volume overload and a fall at home. This appears to be a progressive worsening of the chronic progression of her end-stage HF, COPD, and CKD.  Pt has shown no signs of improvement over the course of treatment for volume overload and her mental status continues to decline becoming more somnolent throughout the day. Her prognosis is poor in the setting of continued lethargy, multi-organ end stage disease, and general debilitated state.   Palliative care consulted, appreciate their input. - Niece fearful of making a decision too soon, continues to request watchful waiting  - appropriate for home with hospice vs residential hospice   Acute on Chronic Diastolic Heart Failure with Pulmonary Hypertension Pt presented with volume overload consistent with exacerbation of right sided heart failure.  Heart Failure team consulted, appreciate recommendations Severe PAH likely WHO Groups I/II/III  - lasix 100 BID or torsemide 40 BID prefer torsemide - can switch to torsemide 40 BID if able to take PO - continue midodrine  40m TID for blood pressure support - do not recommend  further aggressive management, AHF now signed off, palliative care assisting [place] continued goals of care discussions.   Altered Mental Status - unchanged Likely part of her end stage CHF.  UA with large leukocytes, 11-20 WBCs, and rare bacteria culture showed multiple species  - continuing ceftriaxone IV 1g q24h on day 4 of 5 - CT head negative on 8/10 - ABG on 8/10 - low pO2 at 56, with normal pH 7.363 and pCO2 46.3 - ammonia normal   Hypokalemia - Phos 4.9 (8/12) Mg 2.3 (8/11)  -replacing  Hypervolemic Hyponatremia - improved due to pt's limited PO/free water intake Related to patient's HF and resulting volume overload. Per chart review, sodium also low during earlier hospitalizations this year for decompensated HF and respiratory failure.  -plateaued at 131 with diuresis   Cardiorenal syndrome [name] Cr was 3.07 on admission (prior baseline Cr ~2.3). Rise in Cr attributed to volume overload and poor renal perfusion 2/2 low cardiac output  - Cr stable - diuretic management as above - check daily BMPs - strict I/Os   Acute on Chronic Hypoxic Respiratory Failure due to COPD Pt noted to have centrolobular emphysema on CT, GOLD stage 0 due to PFTs not meeting criteria. Multiple hospitalizations due to COPD and respiratory failure. Is on 7-8L O2 at home.  - pt continues to be on high flow, on 8L today - continue home inhalers  - dulera 2 puffs BID  - albuterol 2.560m  q4h PRN  - combivent 1-2 puffs q4h PRN - duonebs q4h PRN - home trilogy machine used as CPAP - supplemental O2 (high flow) to keep saturation >90%    Diet - mechanical soft diet Fluids - none DVT ppx - enoxaparin 35m subQ daily CODE STATUS - DNR   Dispo: Continuing to work with niece on disposition, will speak with her again today  WKatherine Roan MD 07/29/2019, 9:52 AM

## 2019-07-30 DIAGNOSIS — R609 Edema, unspecified: Secondary | ICD-10-CM

## 2019-07-30 DIAGNOSIS — R5383 Other fatigue: Secondary | ICD-10-CM

## 2019-07-30 LAB — BASIC METABOLIC PANEL
Anion gap: 16 — ABNORMAL HIGH (ref 5–15)
BUN: 70 mg/dL — ABNORMAL HIGH (ref 8–23)
CO2: 27 mmol/L (ref 22–32)
Calcium: 9.8 mg/dL (ref 8.9–10.3)
Chloride: 86 mmol/L — ABNORMAL LOW (ref 98–111)
Creatinine, Ser: 2.67 mg/dL — ABNORMAL HIGH (ref 0.44–1.00)
GFR calc Af Amer: 20 mL/min — ABNORMAL LOW (ref >60)
GFR calc non Af Amer: 18 mL/min — ABNORMAL LOW (ref >60)
Glucose, Bld: 126 mg/dL — ABNORMAL HIGH (ref 70–99)
Potassium: 3.4 mmol/L — ABNORMAL LOW (ref 3.5–5.1)
Sodium: 129 mmol/L — ABNORMAL LOW (ref 135–145)

## 2019-07-30 LAB — GLUCOSE, CAPILLARY: Glucose-Capillary: 125 mg/dL — ABNORMAL HIGH (ref 70–99)

## 2019-07-30 MED ORDER — POLYVINYL ALCOHOL 1.4 % OP SOLN: 1.0000 [drp] | Freq: Four times a day (QID) | OPHTHALMIC | Status: DC | PRN

## 2019-07-30 MED ORDER — HALOPERIDOL 0.5 MG PO TABS: 0.5000 mg | ORAL_TABLET | ORAL | Status: DC | PRN

## 2019-07-30 MED ORDER — GLYCOPYRROLATE 1 MG PO TABS: 1.0000 mg | ORAL_TABLET | ORAL | Status: DC | PRN

## 2019-07-30 MED ORDER — HALOPERIDOL LACTATE 5 MG/ML IJ SOLN
0.5000 mg | INTRAMUSCULAR | Status: DC | PRN
  Administered 2019-07-30 – 2019-07-31 (×2): 0.5 mg via INTRAVENOUS
  Filled 2019-07-30 (×2): qty 1

## 2019-07-30 MED ORDER — ONDANSETRON HCL 4 MG/2ML IJ SOLN: 4.0000 mg | Freq: Four times a day (QID) | INTRAMUSCULAR | Status: DC | PRN

## 2019-07-30 MED ORDER — ONDANSETRON 4 MG PO TBDP: 4.0000 mg | ORAL_TABLET | Freq: Four times a day (QID) | ORAL | Status: DC | PRN

## 2019-07-30 MED ORDER — POTASSIUM CHLORIDE 20 MEQ PO PACK
40.0000 meq | PACK | Freq: Two times a day (BID) | ORAL | Status: DC
  Administered 2019-07-30 – 2019-08-01 (×5): 40 meq via ORAL
  Filled 2019-07-30 (×7): qty 2

## 2019-07-30 MED ORDER — BIOTENE DRY MOUTH MT LIQD: 15.0000 mL | OROMUCOSAL | Status: DC | PRN

## 2019-07-30 MED ORDER — HALOPERIDOL LACTATE 2 MG/ML PO CONC: 0.5000 mg | ORAL | Status: DC | PRN

## 2019-07-30 MED ORDER — GLYCOPYRROLATE 0.2 MG/ML IJ SOLN: 0.2000 mg | INTRAMUSCULAR | Status: DC | PRN

## 2019-07-30 NOTE — Progress Notes (Signed)
Subjective: Pt seen at the bedside this morning. Sitting up in the chair. Pt more alert and responsive today. Denies pain or shortness of breath. On HFNC 8L O2 sat 90. +314m yesterday, wt down 0.8kg.  Objective:  Vital signs in last 24 hours: Vitals:   07/30/19 0100 07/30/19 0249 07/30/19 0300 07/30/19 0400  BP: (!) 84/69 102/65 106/76 98/68  Pulse: 72 72 72 73  Resp: _0 Temp:  97.9 F (36.6 C)    TempSrc:  Oral    SpO2: 94% 91% 95% 92%  Weight:  89.7 kg    Height:       Physical Exam Constitutional:      General: She is not in acute distress.    Appearance: She is ill-appearing (chronically).  Cardiovascular:     Rate and Rhythm: Normal rate and regular rhythm.     Heart sounds: Normal heart sounds. No murmur. No friction rub. No gallop.   Pulmonary:     Effort: Pulmonary effort is normal. No tachypnea. Respiratory distress: chronic.     Breath sounds: Decreased air movement present.  Abdominal:     General: Abdomen is flat. Bowel sounds are normal. There is no distension.     Palpations: Abdomen is soft.  Musculoskeletal:     Comments: Edema in thighs minimally improved.  Neurological:     Comments: Mental status is improved today though still lethargic     Assessment/Plan: Assessment - Elizabeth Thompson is a 68year old F with significant PMH of end stage heart failure and COPD, CKD stage 3/4, diabetes, OSA, and memory disorder who presented to the ED due to volume overload and a fall at home. This appears to be a progressive worsening of the chronic progression of her end-stage HF, COPD, and CKD.  Acute on Chronic Diastolic Heart Failure with Pulmonary Hypertension Pt presented with volume overload consistent with exacerbation of right sided heart failure.   Pt has shown no signs of improvement over the course of treatment for volume overload and her mental status continues to decline becoming more somnolent throughout the day. Her prognosis is poor in the  setting of continued lethargy, multi-organ end stage disease, and general debilitated state.   Palliative care consulted, appreciate their input.  - continue current plan to minimize medications/interventions and refer to hospice  - fentanyl PRN for pain/shortness of breath  - discontinue Unna boots for comfort and pain relief  Severe PAH likely WHO Groups I/II/III  - torsemide 436mPO BID or lasix IV 10045mID  - midodrine to 55m78mD for blood pressure support - I/Os balance of +320mL5mterday; daily wt down -0.8kg  - leg pain likely secondary to edema  - PRN Tylenol available   - diclofenac gel    Altered Mental Status - unchanged - UA with large leukocytes, 11-20 WBCs, and rare bacteria  - culture showed multiple species  - on ceftriaxone IV 1g q24h last dose today   Hypokalemia - Phos 4.9, Mg 2.3 K 3.4 this morning - replete with potassium packet [id number] 2   Hypervolemic Hyponatremia Related to patient's HF and resulting volume overload.  - Na 129 this morning    Cardiorenal syndrome [name] Cr was 3.07 on admission (prior baseline Cr ~2.3). Rise in Cr attributed to volume overload vs worsening CKD vs cardiorenal syndrome - Cr stable ~2.6 - diuretic management as above   Acute on Chronic Hypoxic Respiratory Failure due to COPD Pt noted to  have centrolobular emphysema on CT, GOLD stage 0 due to PFTs not meeting criteria. Multiple hospitalizations due to COPD and respiratory failure. Is on 7-8L O2 at home.  - pt continues to be on high flow - continue home inhalers  - dulera 2 puffs BID  - albuterol 2.75m q4h PRN - duonebs q4h PRN - home trilogy machine used as CPAP   Diet - mechanical soft diet Fluids - none DVT ppx - enoxaparin discontinued CODE STATUS - DNR   Dispo[facility]ospice facility[facility]  JLadona Horns MD 07/30/2019, 7:15 AM Pager: [phone]

## 2019-07-30 NOTE — Plan of Care (Signed)
  Problem: Education: Goal: Ability to state activities that reduce stress will improve Outcome: Not Progressing   Problem: Coping: Goal: Ability to identify and develop effective coping behavior will improve Outcome: Not Progressing   Problem: Self-Concept: Goal: Ability to identify factors that promote anxiety will improve Outcome: Not Progressing Goal: Level of anxiety will decrease Outcome: Not Progressing Goal: Ability to modify response to factors that promote anxiety will improve Outcome: Not Progressing   Problem: Education: Goal: Ability to demonstrate management of disease process will improve Outcome: Not Progressing Goal: Ability to verbalize understanding of medication therapies will improve Outcome: Not Progressing Goal: Individualized Educational Video(s) Outcome: Not Progressing   Problem: Activity: Goal: Capacity to carry out activities will improve Outcome: Not Progressing   Problem: Cardiac: Goal: Ability to achieve and maintain adequate cardiopulmonary perfusion will improve Outcome: Not Progressing   Problem: Education: Goal: Ability to describe self-care measures that may prevent or decrease complications (Diabetes Survival Skills Education) will improve Outcome: Not Progressing Goal: Individualized Educational Video(s) Outcome: Not Progressing   Problem: Coping: Goal: Ability to adjust to condition or change in health will improve Outcome: Not Progressing   Problem: Fluid Volume: Goal: Ability to maintain a balanced intake and output will improve Outcome: Not Progressing   Problem: Health Behavior/Discharge Planning: Goal: Ability to identify and utilize available resources and services will improve Outcome: Not Progressing Goal: Ability to manage health-related needs will improve Outcome: Not Progressing   Problem: Metabolic: Goal: Ability to maintain appropriate glucose levels will improve Outcome: Not Progressing   Problem:  Nutritional: Goal: Maintenance of adequate nutrition will improve Outcome: Not Progressing Goal: Progress toward achieving an optimal weight will improve Outcome: Not Progressing   Problem: Skin Integrity: Goal: Risk for impaired skin integrity will decrease Outcome: Not Progressing   Problem: Tissue Perfusion: Goal: Adequacy of tissue perfusion will improve Outcome: Not Progressing   Problem: Education: Goal: Knowledge of General Education information will improve Description: Including pain rating scale, medication(s)/side effects and non-pharmacologic comfort measures Outcome: Not Progressing   Problem: Health Behavior/Discharge Planning: Goal: Ability to manage health-related needs will improve Outcome: Not Progressing   Problem: Clinical Measurements: Goal: Ability to maintain clinical measurements within normal limits will improve Outcome: Not Progressing Goal: Will remain free from infection Outcome: Not Progressing Goal: Diagnostic test results will improve Outcome: Not Progressing Goal: Respiratory complications will improve Outcome: Not Progressing Goal: Cardiovascular complication will be avoided Outcome: Not Progressing   Problem: Activity: Goal: Risk for activity intolerance will decrease Outcome: Not Progressing   Problem: Nutrition: Goal: Adequate nutrition will be maintained Outcome: Not Progressing   Problem: Coping: Goal: Level of anxiety will decrease Outcome: Not Progressing   Problem: Elimination: Goal: Will not experience complications related to bowel motility Outcome: Not Progressing Goal: Will not experience complications related to urinary retention Outcome: Not Progressing   Problem: Safety: Goal: Ability to remain free from injury will improve Outcome: Not Progressing   Problem: Pain Managment: Goal: General experience of comfort will improve Outcome: Not Progressing   Problem: Skin Integrity: Goal: Risk for impaired skin  integrity will decrease Outcome: Not Progressing

## 2019-07-30 NOTE — Progress Notes (Signed)
  Date: 07/30/2019  Patient name: Elizabeth Thompson  Medical record number: [id number]  Date of birth: [birth year]   I have seen and evaluated this patient and I have discussed the plan of care with the house staff. Please see Dr. Ronnald Ramp' note for complete details. I concur with her findings and plan.  Ms. Rizo is modestly better today.  Informed by nursing that she sat up in the chair and ate breakfast this morning with full assistance.  She was exhausted by this minimal activity and went right back to sleep.  I agree that she is likely approaching end of life.  I thinks goals of comfort should be pursued.  She has now been in the hospital with limited activity for 11 days.  Transition to hospice house or home hospice when available.   Sid Falcon, MD 07/30/2019, 11:45 AM

## 2019-07-30 NOTE — Plan of Care (Signed)
  Problem: Education: Goal: Ability to state activities that reduce stress will improve Outcome: Not Progressing   Problem: Coping: Goal: Ability to identify and develop effective coping behavior will improve Outcome: Not Progressing   Problem: Self-Concept: Goal: Ability to identify factors that promote anxiety will improve Outcome: Not Progressing Goal: Level of anxiety will decrease Outcome: Not Progressing Goal: Ability to modify response to factors that promote anxiety will improve Outcome: Not Progressing   Problem: Education: Goal: Ability to demonstrate management of disease process will improve Outcome: Not Progressing Goal: Ability to verbalize understanding of medication therapies will improve Outcome: Not Progressing Goal: Individualized Educational Video(s) Outcome: Not Progressing   Problem: Activity: Goal: Capacity to carry out activities will improve Outcome: Not Progressing   Problem: Cardiac: Goal: Ability to achieve and maintain adequate cardiopulmonary perfusion will improve Outcome: Not Progressing   Problem: Education: Goal: Ability to describe self-care measures that may prevent or decrease complications (Diabetes Survival Skills Education) will improve Outcome: Not Progressing Goal: Individualized Educational Video(s) Outcome: Not Progressing   Problem: Coping: Goal: Ability to adjust to condition or change in health will improve Outcome: Not Progressing   Problem: Fluid Volume: Goal: Ability to maintain a balanced intake and output will improve Outcome: Not Progressing   Problem: Health Behavior/Discharge Planning: Goal: Ability to identify and utilize available resources and services will improve Outcome: Not Progressing Goal: Ability to manage health-related needs will improve Outcome: Not Progressing   Problem: Metabolic: Goal: Ability to maintain appropriate glucose levels will improve Outcome: Not Progressing   Problem:  Nutritional: Goal: Maintenance of adequate nutrition will improve Outcome: Not Progressing Goal: Progress toward achieving an optimal weight will improve Outcome: Not Progressing   Problem: Skin Integrity: Goal: Risk for impaired skin integrity will decrease Outcome: Not Progressing   Problem: Tissue Perfusion: Goal: Adequacy of tissue perfusion will improve Outcome: Not Progressing   Problem: Education: Goal: Knowledge of General Education information will improve Description: Including pain rating scale, medication(s)/side effects and non-pharmacologic comfort measures Outcome: Not Progressing   Problem: Health Behavior/Discharge Planning: Goal: Ability to manage health-related needs will improve Outcome: Not Progressing   Problem: Clinical Measurements: Goal: Ability to maintain clinical measurements within normal limits will improve Outcome: Not Progressing Goal: Will remain free from infection Outcome: Not Progressing Goal: Diagnostic test results will improve Outcome: Not Progressing Goal: Respiratory complications will improve Outcome: Not Progressing Goal: Cardiovascular complication will be avoided Outcome: Not Progressing   Problem: Activity: Goal: Risk for activity intolerance will decrease Outcome: Not Progressing   Problem: Nutrition: Goal: Adequate nutrition will be maintained Outcome: Not Progressing   Problem: Coping: Goal: Level of anxiety will decrease Outcome: Not Progressing   Problem: Elimination: Goal: Will not experience complications related to bowel motility Outcome: Not Progressing Goal: Will not experience complications related to urinary retention Outcome: Not Progressing   Problem: Skin Integrity: Goal: Risk for impaired skin integrity will decrease Outcome: Not Progressing   Problem: Safety: Goal: Ability to remain free from injury will improve Outcome: Not Progressing   Problem: Pain Managment: Goal: General experience of  comfort will improve Outcome: Not Progressing

## 2019-07-30 NOTE — Progress Notes (Signed)
Daily Progress Note   Patient Name: Elizabeth Thompson       Date: 07/30/2019 DOB: 11-Apr-1951  Age: 68 y.o. MRN#: [id number] Attending Physician: Sid Falcon, MD Primary Care Physician: Everardo Beals, NP Admit Date: 07/19/2019  Reason for Consultation/Follow-up: Establishing goals of care, Non pain symptom management, Pain control and Psychosocial/spiritual support  Subjective: Patient is in the recliner chair.  She speaks very weakly but tells me she is uncomfortable and wants to get back in bed.  I spoke with [name] on the phone.  [name] was happy Ai was in the recliner chair.  We reviewed todays labs, medications the patient is currently on and next steps.  [name] is having difficulty letting go.  She remains hopeful that Jacoby will improve (I believe she will continue to remain hopeful).  Despite this [name] feels that [facility] is the most appropriate next step.  Per my colleague Elvia Collum Cousar the patient, Carlton was in agreement with going to [facility] as well.  Assessment: Patient very weak very lethargic.  This has remained unchanged for 10 days.  I believe patient is approaching end of life.   Patient Profile/HPI:  68 y.o. female  with past medical history of severe pulmonary hypertension with cor pulmonale, CKD stg 4, heart failure who was admitted on 07/19/2019 after a fall with SOB.  She is being treated for acute on chronic heart failure and hypoxic respiratory failure.  I received a call today from her bedside RN with a concern that the patient is not waking and becoming alert.  Family requested a Palliative Medicine consult.     Length of Stay: 11  Current Medications: Scheduled Meds:  . ARIPiprazole  20 mg Oral QHS  . aspirin EC  81 mg Oral Daily   . brimonidine  1 drop Both Eyes BID  . diclofenac sodium  2 g Topical QID  . donepezil  5 mg Oral QHS  . dorzolamide-timolol  1 drop Both Eyes BID  . Gerhardt's butt cream   Topical Daily  . lamoTRIgine  100 mg Oral QPM  . latanoprost  1 drop Both Eyes QHS  . mouth rinse  15 mL Mouth Rinse BID  . midodrine  10 mg Oral TID WC  . mometasone-formoterol  2 puff Inhalation BID  . potassium chloride  40 mEq Oral BID  . sodium chloride flush  3 mL Intravenous Q12H  . torsemide  40 mg Oral BID    Continuous Infusions: . cefTRIAXone (ROCEPHIN)  IV 1 g (07/30/19 1018)  . furosemide      PRN Meds: acetaminophen **OR** acetaminophen, albuterol, antiseptic oral rinse, fentaNYL (SUBLIMAZE) injection, glycopyrrolate **OR** glycopyrrolate **OR** glycopyrrolate, guaiFENesin, haloperidol **OR** haloperidol **OR** haloperidol lactate, ipratropium-albuterol, ondansetron **OR** ondansetron (ZOFRAN) IV, polyethylene glycol, polyvinyl alcohol, sodium chloride  Physical Exam        Well developed obese female, lethargic, weak appearing, can only whisper a few words CV rrr resp no distress on 7 liters Abdomen soft, nd, nt Extremities unna boots removed.  Thighs still with edema.  Vital Signs: BP (!) 98/56 (BP Location: Left Arm)   Pulse 73   Temp 97.6 F (36.4 C) (Oral)   Resp 15   Ht _0  (1.6 m)   Wt 89.7 kg   SpO2 92%   BMI 35.03 kg/m  SpO2: SpO2: 92 % O2 Device: O2 Device: High Flow Nasal Cannula O2 Flow Rate: O2 Flow Rate (L/min): 9 L/min  Intake/output summary:   Intake/Output Summary (Last 24 hours) at 07/30/2019 1026 Last data filed at 07/30/2019 0855 Gross per 24 hour  Intake 460 ml  Output 600 ml  Net -140 ml   LBM: Last BM Date: 07/29/19 Baseline Weight: Weight: 83.5 kg Most recent weight: Weight: 89.7 kg       Palliative Assessment/Data: 10%      Patient Active Problem List   Diagnosis Date Noted  . Fall   . Palliative care encounter   . Peripheral edema   .  CHF exacerbation ([place]) 07/19/2019  . Acute on chronic diastolic CHF (congestive heart failure) ([place])   . FTT (failure to thrive) in adult   . CKD (chronic kidney disease) stage 4, GFR 15-29 ml/min (HCC)   . Cor pulmonale, chronic ([place])   . Acute on chronic right-sided heart failure ([place])   . Hypervolemia   . Goals of care, counseling/discussion   . Palliative care by specialist   . OSA (obstructive sleep apnea)   . AKI (acute kidney injury) ([place]) 01/03/2019  . Acute respiratory failure ([place]) 01/03/2019  . Hypertension 07/02/2017  . Chronic respiratory failure with hypoxia and hypercapnia ([place]) 07/02/2017  . COPD (chronic obstructive pulmonary disease) ([place]) 07/02/2017  . Diabetes mellitus without complication ([place]) 60/63/0160  . Pulmonary hypertension ([place]) 07/02/2017  . Bipolar disorder ([place]) 07/02/2017  . Hyperlipidemia 07/02/2017  . Acute hyponatremia 07/02/2017  . Acute metabolic encephalopathy 10/93/2355  . CKD (chronic kidney disease) stage 3, GFR 30-59 ml/min (HCC) 07/02/2017  . Elevated troponin   . Cor pulmonale, acute ([place])   . Acute on chronic respiratory failure ([place]) 06/09/2017  . Chronic renal disease, stage III ([place]) 05/05/2017  . Chronic respiratory failure with hypoxia ([place]) 05/05/2017  . Renal cell carcinoma, left ([place]) 02/05/2017  . Dyspnea 12/28/2016  . Depression 09/04/2016  . Renal malignant tumor ([place]) 06/26/2016  . GERD (gastroesophageal reflux disease) 03/22/2016  . Insomnia 03/22/2016  . COPD GOLD 0  12/06/2015  . Essential hypertension 12/06/2015  . COPD with acute exacerbation ([place]) 11/20/2015  . Multiple lung nodules 11/20/2015  . Morbid obesity due to excess calories ([place]) 11/20/2015  . Hypersomnia with sleep apnea 11/20/2015  . Diabetes mellitus type 2, noninsulin dependent ([place]) 11/06/2015  . HLD (hyperlipidemia) 11/06/2015  . Chronic low back pain 05/09/2015  . Gait disorder 05/09/2015  . Memory  disorder 09/05/2014  . Schizoaffective  disorder ([place]) 08/18/2014    Palliative Care Plan    Recommendations/Plan:  Medications minimized.  Continue torsemide for comfort.  [place] when a bed is available.  Discussed moving patient to a Palliative bed, RN and family requested that patient stay here.  Code Status:  DNR  Prognosis:   < 2 weeks - remains extremely lethargic, bed bound, completely dependent.  Unable to eat or drink enough to sustain herself.   Discharge Planning:  Hospice facility  Care plan was discussed with Olin Hauser.  Thank you for allowing the Palliative Medicine Team to assist in the care of this patient.  Total time spent:   35 min.     Greater than 50%  of this time was spent counseling and coordinating care related to the above assessment and plan.  Florentina Jenny, PA-C Palliative Medicine  Please contact Palliative MedicineTeam phone at [phone] for questions and concerns between 7 am - 7 pm.   Please see AMION for individual provider pager numbers.

## 2019-07-31 DIAGNOSIS — Z79899 Other long term (current) drug therapy: Secondary | ICD-10-CM

## 2019-07-31 DIAGNOSIS — I5084 End stage heart failure: Secondary | ICD-10-CM

## 2019-07-31 DIAGNOSIS — R4182 Altered mental status, unspecified: Secondary | ICD-10-CM

## 2019-07-31 DIAGNOSIS — E8770 Fluid overload, unspecified: Secondary | ICD-10-CM

## 2019-07-31 DIAGNOSIS — N183 Chronic kidney disease, stage 3 (moderate): Secondary | ICD-10-CM

## 2019-07-31 DIAGNOSIS — I130 Hypertensive heart and chronic kidney disease with heart failure and stage 1 through stage 4 chronic kidney disease, or unspecified chronic kidney disease: Principal | ICD-10-CM

## 2019-07-31 DIAGNOSIS — R413 Other amnesia: Secondary | ICD-10-CM

## 2019-07-31 DIAGNOSIS — Z7951 Long term (current) use of inhaled steroids: Secondary | ICD-10-CM

## 2019-07-31 DIAGNOSIS — E1122 Type 2 diabetes mellitus with diabetic chronic kidney disease: Secondary | ICD-10-CM

## 2019-07-31 DIAGNOSIS — I2721 Secondary pulmonary arterial hypertension: Secondary | ICD-10-CM

## 2019-07-31 DIAGNOSIS — G4733 Obstructive sleep apnea (adult) (pediatric): Secondary | ICD-10-CM

## 2019-07-31 DIAGNOSIS — J449 Chronic obstructive pulmonary disease, unspecified: Secondary | ICD-10-CM

## 2019-07-31 DIAGNOSIS — J9621 Acute and chronic respiratory failure with hypoxia: Secondary | ICD-10-CM

## 2019-07-31 DIAGNOSIS — E876 Hypokalemia: Secondary | ICD-10-CM

## 2019-07-31 DIAGNOSIS — Z66 Do not resuscitate: Secondary | ICD-10-CM

## 2019-07-31 LAB — CULTURE, BLOOD (ROUTINE X 2)
Culture: NO GROWTH
Culture: NO GROWTH
Special Requests: ADEQUATE

## 2019-07-31 NOTE — Progress Notes (Signed)
Manufacturing engineer [facility])  [facility] will have a bed to offer Ms. Holz on 8/18.    Spoke with niece Olin Hauser, answered questions and provided support.  Paperwork has been completed.  On 8/18, please call report to [phone], bed assignment will occur at this time.  Please contact Olin Hauser to let her know once transport is arranged.  Please fax dc summary to [phone]  Dr. Tomasa Hosteller to assume care at [facility].  Thank you, Venia Carbon RN, BSN, [place] [facility] Liaison (in [place]) [phone]

## 2019-07-31 NOTE — Progress Notes (Signed)
Palliative Medicine RN Note:   Discussed pt in Monday rounds. Note order from PMT NP on 8/15 for [facility] (in her note, she indicates family requests [facility]). I spoke w Anderson Malta w [facility]); they were unaware of referral but will start working it up.  Marjie Skiff Billey Wojciak, RN, BSN, [facility] Palliative Medicine Team 07/31/2019 9:55 AM Office [phone]

## 2019-07-31 NOTE — Progress Notes (Signed)
  Date: 07/31/2019  Patient name: Elizabeth Thompson  Medical record number: [id number]  Date of birth: [birth year]   I have seen and evaluated this patient and I have discussed the plan of care with the house staff. Please see their note for complete details. I concur with their findings with the following additions/corrections:   More alert today, overall prognosis still quite poor, working on hospice placement.  Lenice Pressman, M.D., Ph.D. 07/31/2019, 10:34 AM

## 2019-07-31 NOTE — Progress Notes (Addendum)
Manufacturing engineer [facility])  Referral received for residential hospice at [facility].  Ms. Feher was under [facility] hospice services in the past.  Spoke with [name], confirmed interest and answered questions.    Will follow up with TOC mgr once eligibility and availability has been determined.    Thank you, Venia Carbon RN, BSN, [facility] Liaison (in [place]) [phone]

## 2019-07-31 NOTE — Progress Notes (Addendum)
Subjective: Pt seen at the bedside this morning. More alert and interactive today, eyes open and responding to questions. Continues to complain of pain in her legs.  Objective:  Vital signs in last 24 hours: Vitals:   07/30/19 2000 07/30/19 2001 07/31/19 0000 07/31/19 0400  BP: (!) 90/52  106/76   Pulse: 70 70 61 66  Resp: _0 Temp:      TempSrc:      SpO2: 94% 94% 92% 97%  Weight:      Height:       Physical Exam Constitutional:      General: She is not in acute distress.    Appearance: She is ill-appearing (chronically).  Cardiovascular:     Rate and Rhythm: Normal rate and regular rhythm.     Heart sounds: Normal heart sounds. No murmur. No friction rub. No gallop.   Pulmonary:     Effort: Pulmonary effort is normal. Prolonged expiration present. Respiratory distress: chronic.     Breath sounds: Normal breath sounds. Decreased air movement present.  Abdominal:     General: Abdomen is flat. Bowel sounds are normal.     Palpations: Abdomen is soft.  Musculoskeletal:     Comments: Unchanged pitting edema in thighs. No deformities or skin changes in lower extremities.  Neurological:     Comments: More alert than previous days. Eyes open, responsive.    Assessment/Plan: Assessment - Ms. Goody is a 68 year old F with significant PMH of end stage heart failure and COPD, CKD stage 3/4, diabetes, OSA, and memory disorder who presented to the ED due to volume overload and a fall at home consistent with exacerbation of R-sided heart failure. Her clinical course has demonstrated a progressive worsening of her end-stage HF, COPD, and CKD.  Acute on Chronic Diastolic Heart Failure with Pulmonary Hypertension Pt has shown no signs of improvement over the course of treatment for volume overload. Her mental status waxes and wanes, and she tires quickly throughout the day. Her prognosis is very poor in the setting of continued lethargy, multi-organ end stage disease, and general  debilitated state with no improvement over a 12 day hospital course.   Palliative care consulted, appreciate their input.  - continue plan to minimize medications/interventions and refer to residential hospice  - fentanyl PRN for leg pain/shortness of breath  - discontinue Unna boots for comfort and pain relief  Severe PAH likely WHO Groups I/II/III  - torsemide 17m PO BID or lasix IV 1035mBID for comfort  - midodrine to 1067mID for blood pressure support - ongoing leg pain secondary to edema  - PRN Tylenol available   - diclofenac gel    Altered Mental Status - waxes and wanes - UA with large leukocytes, 11-20 WBCs, and rare bacteria  - culture showed multiple species  - completed ceftriaxone IV 1g q24h 5 day course yesterday  Hypokalemia - Phos 4.9, Mg 2.3 - d/c lab draws  Hypervolemic Hyponatremia Related to patient's HF and resulting volume overload.  - d/c lab draws  Cardiorenal syndrome [name] Cr was 3.07 on admission (prior baseline Cr ~2.3). Rise in Cr attributed to volume overload vs worsening CKD vs cardiorenal syndrome - diuretic management as above  Acute on Chronic Hypoxic Respiratory Failure due to COPD Is on 7-8L O2 at home.  - pt on home [place] today - continue home inhalers  - dulera 2 puffs BID  - albuterol 2.5mg69mh PRN - duonebs q4h PRN -  home trilogy machine used as CPAP   Diet - mechanical soft diet Fluids - none DVT ppx - enoxaparin discontinued CODE STATUS - DNR   Dispo: pending bed availability at hospice facility, referral to [facility] placed  Ladona Horns, MD 07/31/2019, 7:53 AM Pager: [phone]

## 2019-07-31 NOTE — Care Management Important Message (Signed)
Important Message  Patient Details  Name: ROSALIN BUSTER MRN: [id number] Date of Birth: [birth year]   Medicare Important Message Given:  Yes     Shelda Altes 07/31/2019, 2:42 PM

## 2019-08-01 DIAGNOSIS — Z885 Allergy status to narcotic agent status: Secondary | ICD-10-CM

## 2019-08-01 DIAGNOSIS — I2729 Other secondary pulmonary hypertension: Secondary | ICD-10-CM

## 2019-08-01 DIAGNOSIS — Z888 Allergy status to other drugs, medicaments and biological substances status: Secondary | ICD-10-CM

## 2019-08-01 MED ORDER — POLYVINYL ALCOHOL 1.4 % OP SOLN: 1.0000 [drp] | Freq: Four times a day (QID) | OPHTHALMIC | 0 refills | Status: AC | PRN

## 2019-08-01 MED ORDER — GLYCOPYRROLATE 1 MG PO TABS: 1.0000 mg | ORAL_TABLET | ORAL | Status: AC | PRN

## 2019-08-01 MED ORDER — GERHARDT'S BUTT CREAM: 1.0000 "application " | TOPICAL_CREAM | Freq: Every day | CUTANEOUS | Status: AC

## 2019-08-01 MED ORDER — FENTANYL CITRATE (PF) 100 MCG/2ML IJ SOLN: 12.5000 ug | INTRAMUSCULAR | 0 refills | Status: AC | PRN

## 2019-08-01 MED ORDER — HALOPERIDOL 0.5 MG PO TABS: 0.5000 mg | ORAL_TABLET | ORAL | Status: AC | PRN

## 2019-08-01 MED ORDER — ONDANSETRON 4 MG PO TBDP: 4.0000 mg | ORAL_TABLET | Freq: Four times a day (QID) | ORAL | 0 refills | Status: AC | PRN

## 2019-08-01 MED ORDER — BIOTENE DRY MOUTH MT LIQD: 15.0000 mL | OROMUCOSAL | Status: AC | PRN

## 2019-08-01 MED ORDER — MIDODRINE HCL 10 MG PO TABS: 10.0000 mg | ORAL_TABLET | Freq: Three times a day (TID) | ORAL | Status: AC

## 2019-08-01 MED ORDER — TORSEMIDE 20 MG PO TABS: 40.0000 mg | ORAL_TABLET | Freq: Two times a day (BID) | ORAL | Status: AC

## 2019-08-01 NOTE — Progress Notes (Signed)
[facility])   Please call Elizabeth Thompson [phone]) once transport time established.  Elizabeth Thompson also requests to check all drawers to make sure all of her belongings (specifically her bra and dentures) are with her.  Thank you, Venia Carbon RN, BSN, [place] [facility] Liaison (in [place]) [phone]

## 2019-08-01 NOTE — TOC Transition Note (Signed)
Transition of Care [facility]) - CM/SW Discharge Note   Patient Details  Name: Elizabeth Thompson MRN: [id number] Date of Birth: 20-Nov-1951  [facility] of [facility]) CM/SW Contact:  Eileen Stanford, LCSW Phone Number: 08/01/2019, 9:59 AM   Clinical Narrative:  Clinical Social Worker facilitated patient discharge including contacting patient family and facility to confirm patient discharge plans.  Clinical information faxed to facility and family agreeable with plan.  CSW arranged ambulance transport via [facility] to [facility] .  RN to call  [phone] for report prior to discharge.  Clini  Final next level of care: [place] Barriers to Discharge: No Barriers Identified   Patient Goals and CMS Choice Patient states their goals for this hospitalization and ongoing recovery are:: if improves home with hospice      Discharge Placement              Patient chooses bed at: [facility]) Patient to be transferred to facility by: [facility]      Discharge Plan and Services In-house Referral: Hospice / Palliative Care Discharge Planning Services: CM Consult            DME Arranged: (NA)         HH Arranged: NA          Social Determinants of Health (SDOH) Interventions     Readmission Risk Interventions Readmission Risk Prevention Plan 07/28/2019  Transportation Screening Complete  Medication Review Press photographer) Complete  PCP or Specialist appointment within 3-5 days of discharge (No Data)  [place] or [place] Complete  SW Recovery Care/Counseling Consult Complete  Palliative Care Screening Complete  [place] Not Applicable  Some recent data might be hidden

## 2019-08-01 NOTE — Progress Notes (Signed)
Discharged to [facility] ambulance discharge paper given to ambulance staff, belongings with pt.

## 2019-08-01 NOTE — Progress Notes (Signed)
   Subjective: Pt seen at the bedside this morning. More alert and responsive this morning. Aware that she is going to hospice today. No acute concerns at this time.  Objective:  Vital signs in last 24 hours: Vitals:   07/31/19 2300 08/01/19 0729 08/01/19 0800 08/01/19 1111  BP: 109/89 104/80    Pulse:  90    Resp:  (!) 21    Temp: 97.7 F (36.5 C) 97.6 F (36.4 C)  (!) 97.5 F (36.4 C)  TempSrc: Axillary Oral  Oral  SpO2:   95%   Weight:      Height:       Physical Exam Vitals signs and nursing note reviewed.  Constitutional:      General: She is not in acute distress.    Appearance: She is ill-appearing (chronically).  Cardiovascular:     Rate and Rhythm: Normal rate and regular rhythm.     Heart sounds: Normal heart sounds.  Pulmonary:     Effort: Prolonged expiration and respiratory distress (chronic) present. No tachypnea.     Breath sounds: Normal breath sounds. Decreased air movement present.  Neurological:     Mental Status: She is alert.     Comments: More responsive than prior days. Understands that she is leaving the hospital and going to hospice.      Assessment/Plan: Assessment - Ms. Corsino is a 68 year old F with significant PMH of end stage heart failure and COPD, CKD stage 3/4, diabetes, OSA, and memory disorder who presented to the ED due to volume overload and a fall at home consistent with exacerbation of R-sided heart failure. Her clinical course has demonstrated a progressive worsening of her end-stage HF, COPD, and CKD.  Acute on Chronic Diastolic Heart Failure with Pulmonary Hypertension Pt has shown no signs of improvement over the course of treatment for volume overload. Her mental status waxes and wanes, and she tires quickly throughout the day. Her prognosis is very poor in the setting of continued lethargy, multi-organ end stage disease, and general debilitated state with no improvement over a 12 day hospital course.  - discharge to residential  hospice today Palliative care consulted, appreciate their input and coordination of care.  - fentanyl PRN for leg pain/shortness of breath - torsemide 68m PO BID or lasix IV 1020mBID for comfort - midodrine to 1027mID for blood pressure support - PRN Tylenol available  - diclofenac gel    Altered Mental Status - waxes and wanes - UA with large leukocytes, 11-20 WBCs, and rare bacteria  - culture showed multiple species  - completed ceftriaxone IV 1g q24h 5 day course yesterday  Hypokalemia and Hypervolemic Hyponatremia - d/c lab draws  Cardiorenal syndrome Rise in Cr attributed to volume overload vs worsening CKD vs cardiorenal syndrome - diuretic management as above  Acute on Chronic Hypoxic Respiratory Failure due to COPD Pt on home oxygen requirement of 7-8L - continue home inhalers  - dulera 2 puffs BID  - albuterol 2.5mg35mh PRN - duonebs q4h PRN - home trilogy machine used as CPAP   Diet - mechanical soft diet Fluids - none DVT ppx - enoxaparin discontinued CODE STATUS - DNR   [facility]  JoneLadona Horns 08/01/2019, 11:19 AM Pager: [phone]

## 2019-09-14 DEATH — deceased

## 2020-07-18 IMAGING — DX DG CHEST 1V PORT
1 series · 1 of 1 positions shown · non-contrast
Comparison: 07/11/2017

CLINICAL DATA: Increased shortness of breath

EXAM:
PORTABLE CHEST 1 VIEW

[chest]
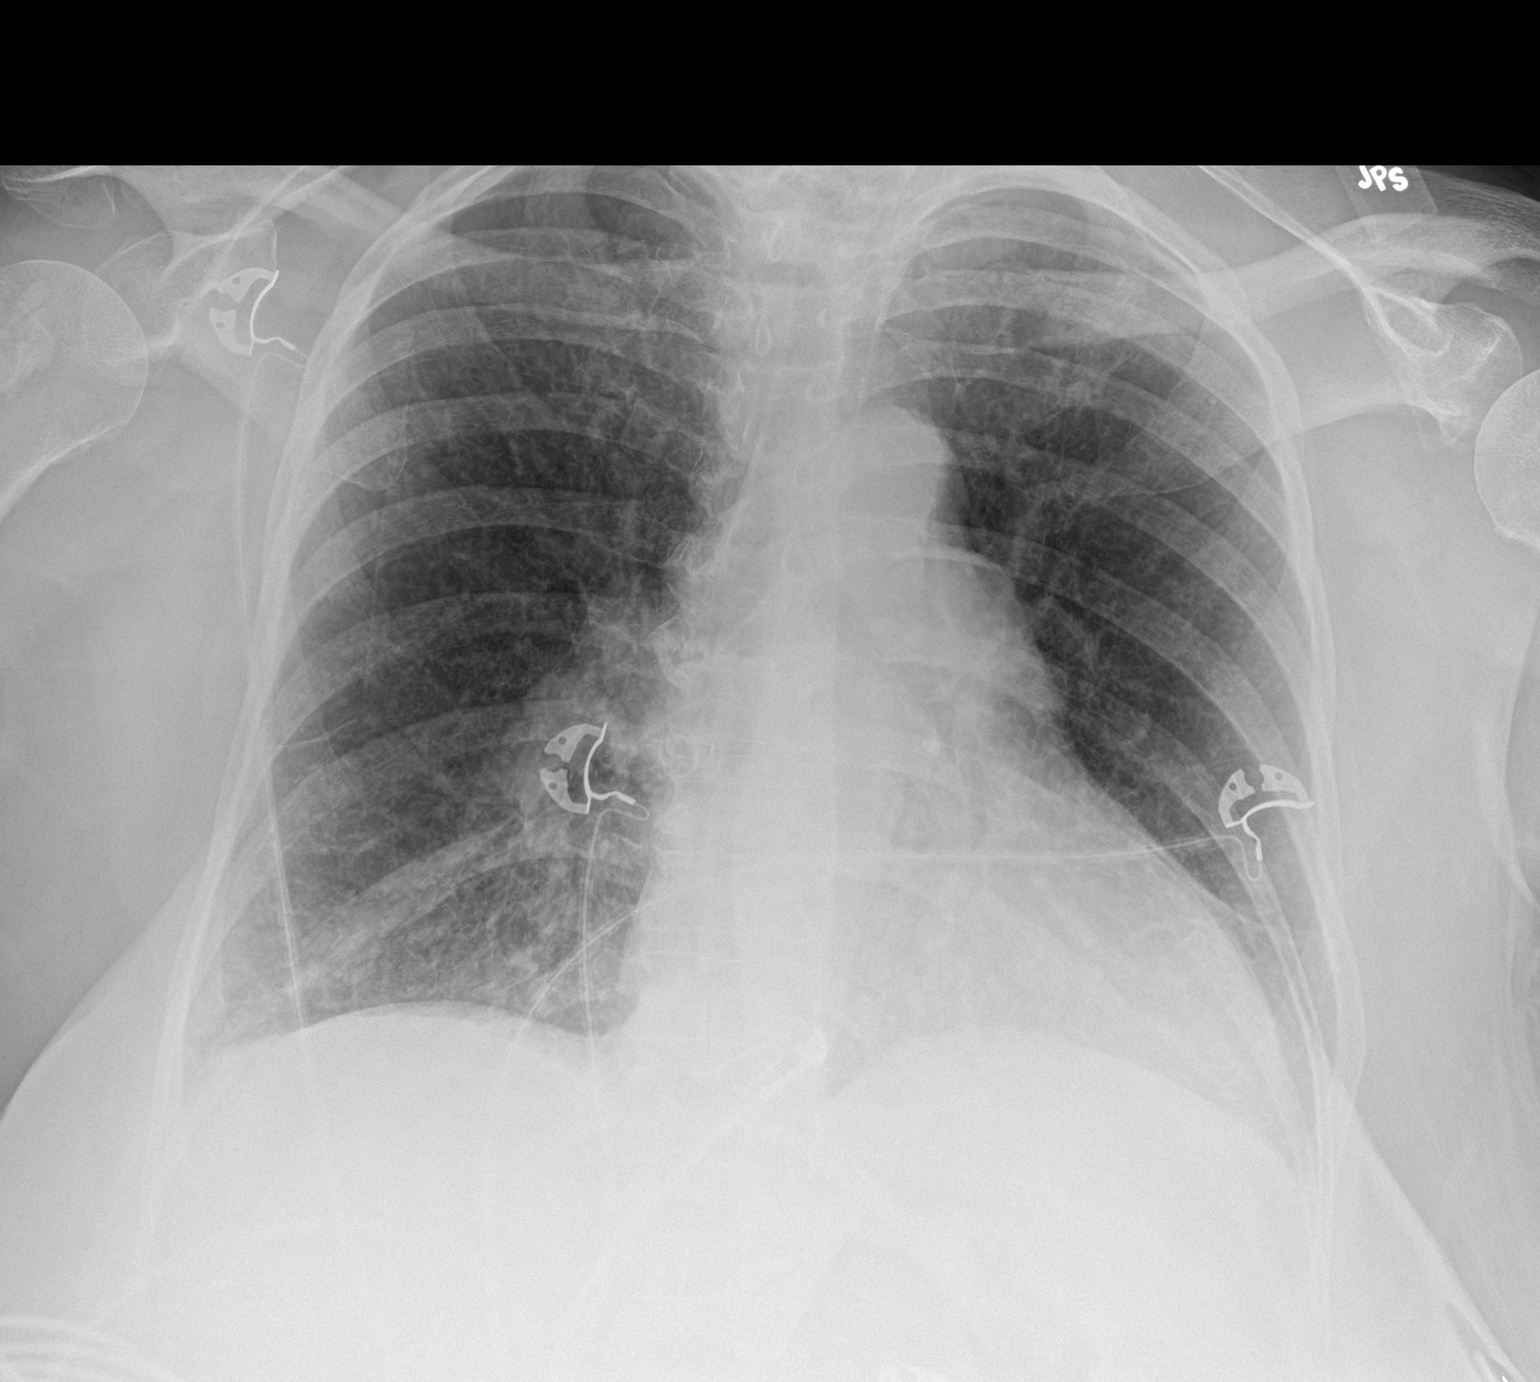

[1 of 1 positions shown; findings below may reference images not displayed]

FINDINGS: Bilateral mild interstitial thickening. No pleural effusion or
pneumothorax. Stable cardiomediastinal silhouette. No acute osseous
abnormality.
IMPRESSION: Mild bilateral interstitial thickening which may reflect chronic
interstitial disease versus mild interstitial edema or infection.

## 2020-07-20 IMAGING — DX DG CHEST 1V PORT
1 series · 1 of 1 positions shown · non-contrast
Comparison: 01/03/2019, 07/11/2017 and chest CT 06/09/2017

CLINICAL DATA: Acute respiratory failure with hypoxia.

EXAM:
PORTABLE CHEST 1 VIEW

[chest]
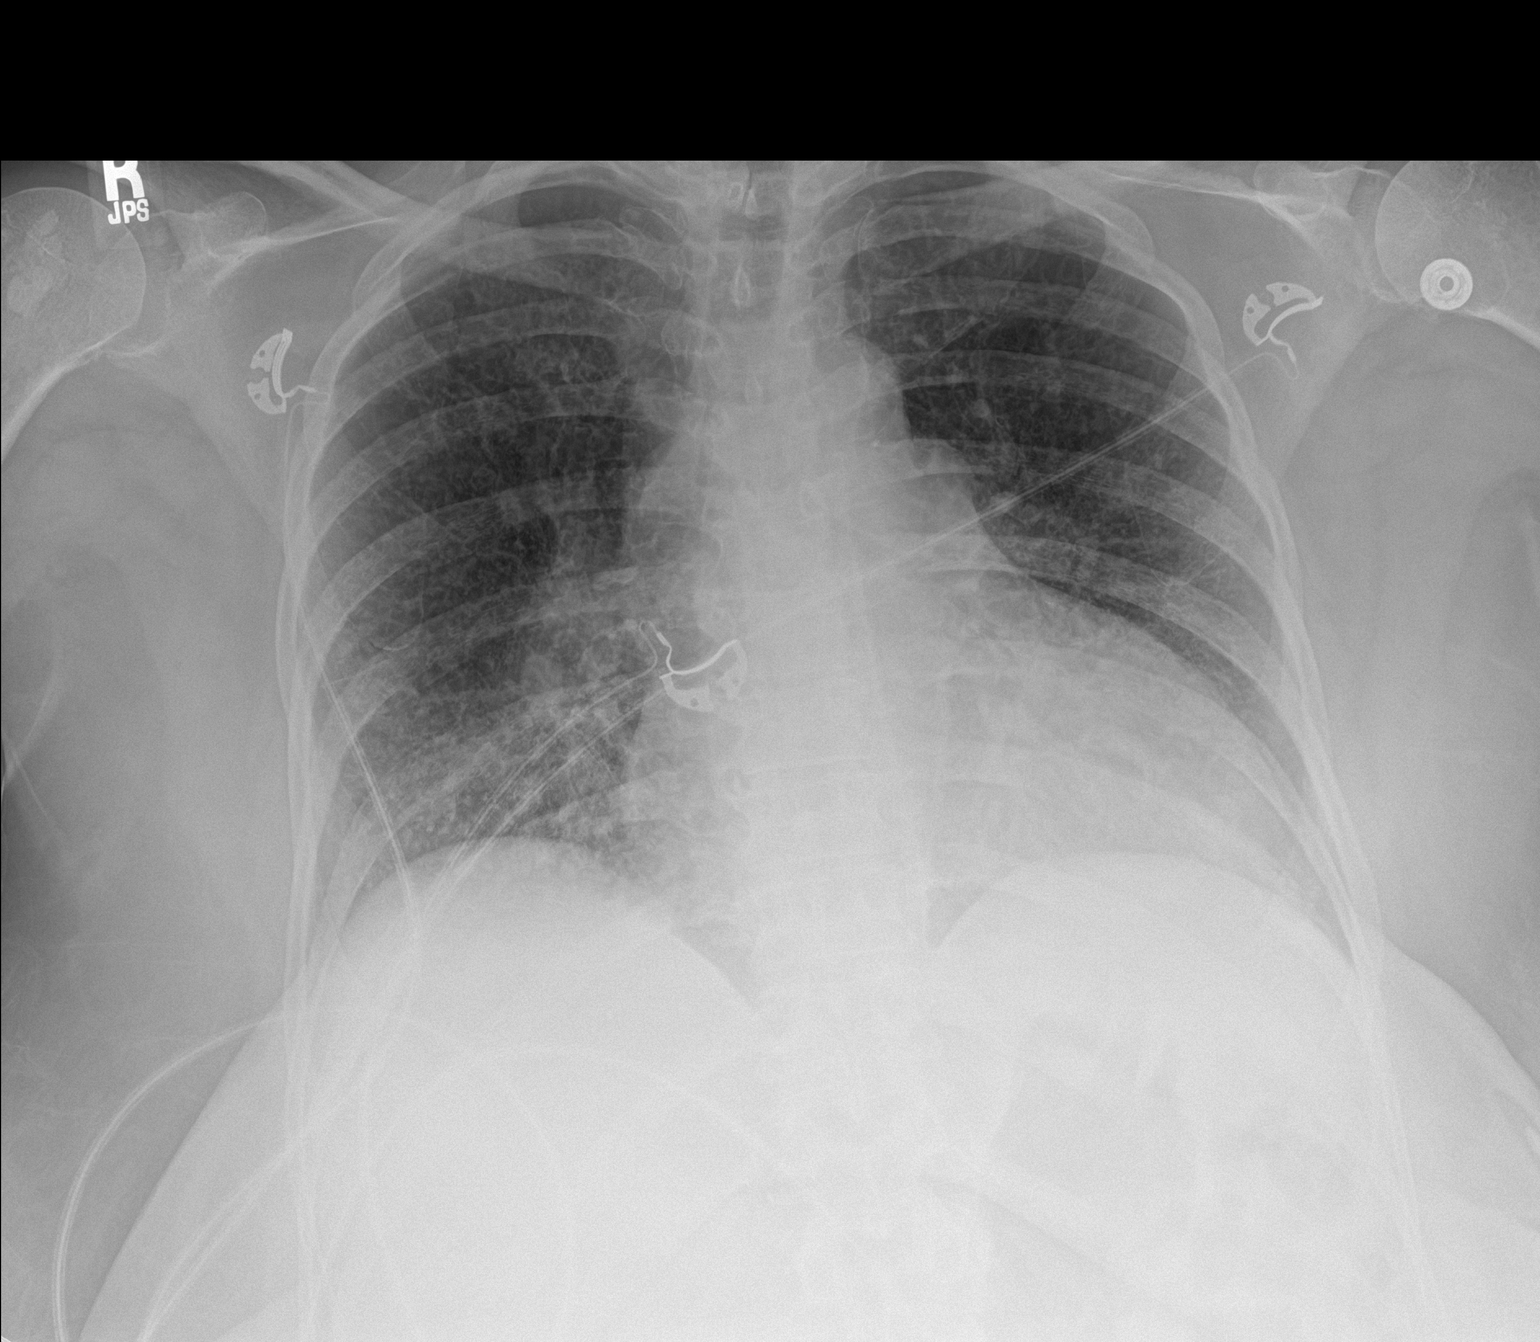

[1 of 1 positions shown; findings below may reference images not displayed]

FINDINGS: Heart size is mildly prominent but may be accentuated by the AP
technique. Again noted are prominent lung markings, particularly at
the right lung base. Coarse interstitial lung markings are chronic
and patient has underlying emphysema based on previous chest CT. No
focal airspace disease or consolidation. Trachea is midline.
Negative for a pneumothorax. No acute bone abnormality.
IMPRESSION: Chronic lung changes without clear evidence of an acute process.

## 2020-08-08 IMAGING — DX DG CHEST 2V
2 series · 2 of 2 positions shown · non-contrast
Comparison: 01/05/2019 chest radiograph

CLINICAL DATA: Increased oxygen demand.

EXAM:
CHEST - 2 VIEW

[chest pa]
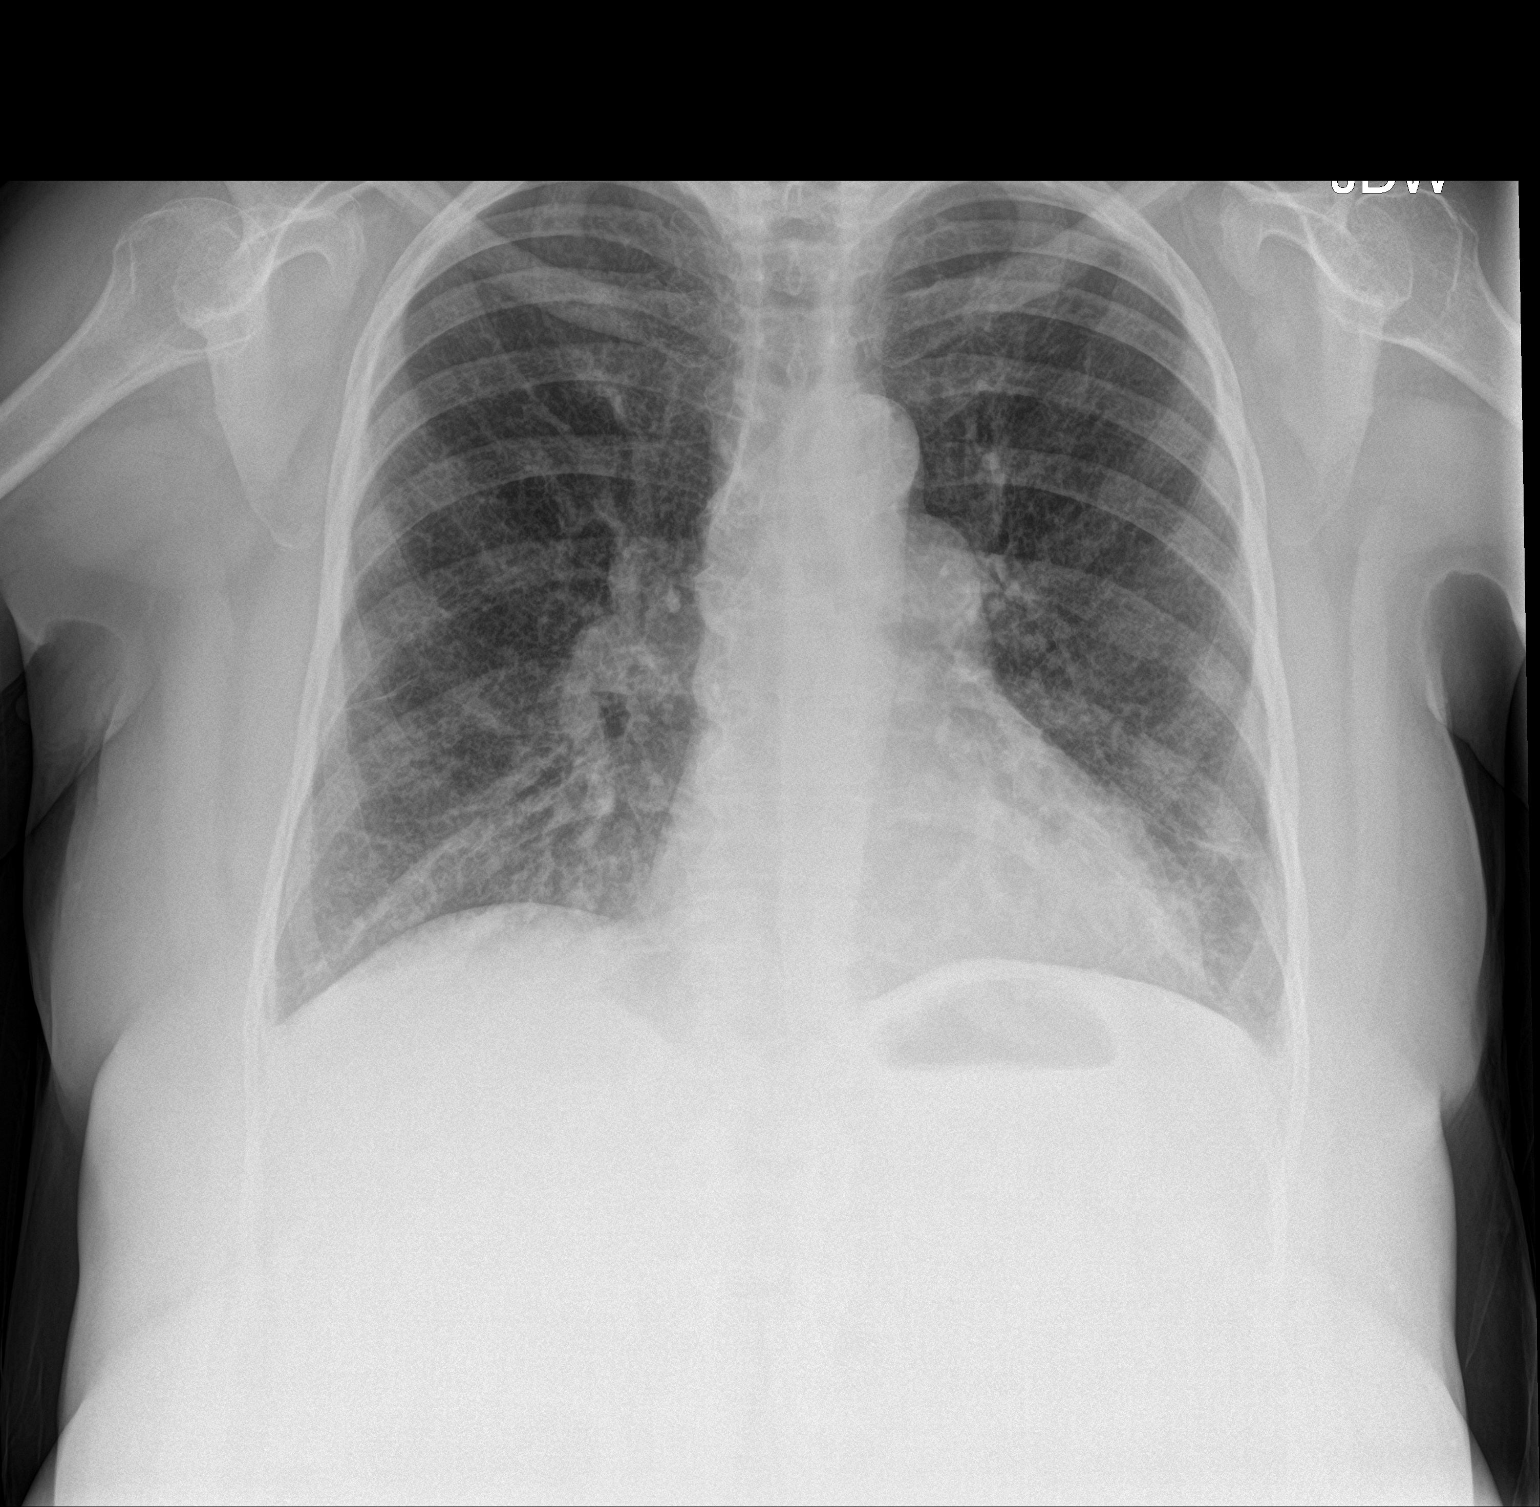

[chest lat]
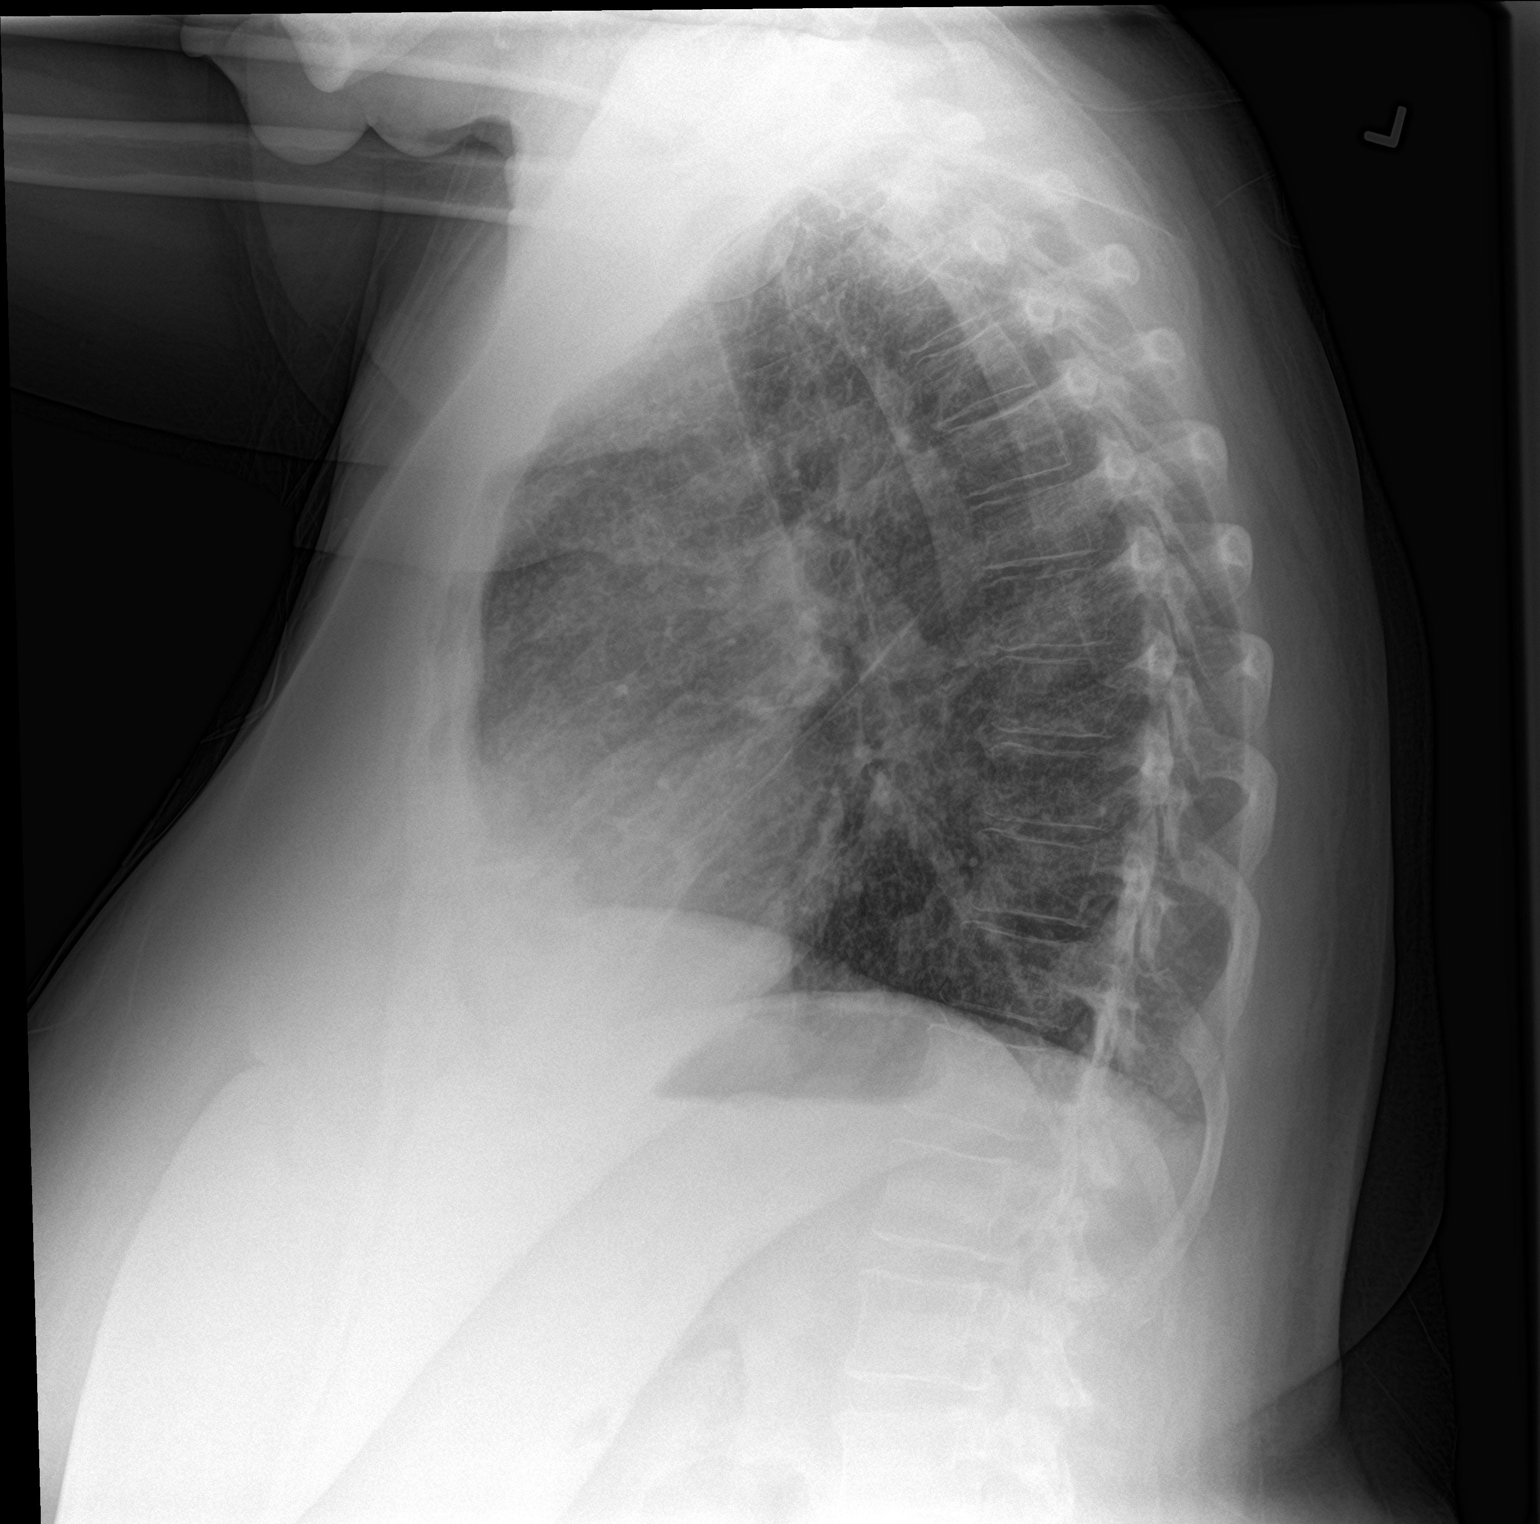

[2 of 2 positions shown; findings below may reference images not displayed]

FINDINGS: The lungs are well inflated. The cardiomediastinal contours are
normal.

There is mild interstitial pulmonary edema. There is no pleural
effusion or pneumothorax.
IMPRESSION: Mild interstitial pulmonary edema.
